# Patient Record
Sex: Male | Born: 1950 | Race: Black or African American | Hispanic: No | Smoking: Never smoker
Health system: Southern US, Community
[De-identification: ages and names within clinical notes are randomized; demographics above are authoritative.]

## PROBLEM LIST (undated history)

## (undated) DIAGNOSIS — N289 Disorder of kidney and ureter, unspecified: Secondary | ICD-10-CM

## (undated) DIAGNOSIS — N186 End stage renal disease: Secondary | ICD-10-CM

## (undated) DIAGNOSIS — Z992 Dependence on renal dialysis: Secondary | ICD-10-CM

## (undated) DIAGNOSIS — Z8679 Personal history of other diseases of the circulatory system: Secondary | ICD-10-CM

## (undated) DIAGNOSIS — I10 Essential (primary) hypertension: Secondary | ICD-10-CM

## (undated) DIAGNOSIS — Z872 Personal history of diseases of the skin and subcutaneous tissue: Secondary | ICD-10-CM

## (undated) DIAGNOSIS — C801 Malignant (primary) neoplasm, unspecified: Secondary | ICD-10-CM

## (undated) DIAGNOSIS — E119 Type 2 diabetes mellitus without complications: Secondary | ICD-10-CM

## (undated) HISTORY — PX: OTHER SURGICAL HISTORY: SHX169

## (undated) HISTORY — PX: NEPHRECTOMY: SHX65

## (undated) HISTORY — PX: KNEE SURGERY: SHX244

---

## 2011-06-10 DEATH — deceased

## 2011-07-22 ENCOUNTER — Emergency Department: Payer: Self-pay | Admitting: Emergency Medicine

## 2011-07-22 LAB — CBC
HCT: 34.9 % — ABNORMAL LOW (ref 40.0–52.0)
HGB: 11.8 g/dL — ABNORMAL LOW (ref 13.0–18.0)
MCH: 31.3 pg (ref 26.0–34.0)
MCV: 92 fL (ref 80–100)
Platelet: 134 10*3/uL — ABNORMAL LOW (ref 150–440)
RBC: 3.78 10*6/uL — ABNORMAL LOW (ref 4.40–5.90)
WBC: 4.3 10*3/uL (ref 3.8–10.6)

## 2011-07-22 LAB — URINALYSIS, COMPLETE
Bacteria: NONE SEEN
Ketone: NEGATIVE
Nitrite: NEGATIVE
Ph: 5 (ref 4.5–8.0)
Protein: 100
Specific Gravity: 1.012 (ref 1.003–1.030)

## 2011-07-22 LAB — BASIC METABOLIC PANEL
BUN: 43 mg/dL — ABNORMAL HIGH (ref 7–18)
Chloride: 98 mmol/L (ref 98–107)
Creatinine: 7.68 mg/dL — ABNORMAL HIGH (ref 0.60–1.30)
EGFR (Non-African Amer.): 8 — ABNORMAL LOW
Osmolality: 303 (ref 275–301)
Potassium: 4.2 mmol/L (ref 3.5–5.1)
Sodium: 138 mmol/L (ref 136–145)

## 2011-08-13 ENCOUNTER — Emergency Department: Payer: Self-pay | Admitting: *Deleted

## 2011-08-13 LAB — COMPREHENSIVE METABOLIC PANEL
Albumin: 3.7 g/dL (ref 3.4–5.0)
Alkaline Phosphatase: 108 U/L (ref 50–136)
BUN: 49 mg/dL — ABNORMAL HIGH (ref 7–18)
Bilirubin,Total: 0.3 mg/dL (ref 0.2–1.0)
Calcium, Total: 9 mg/dL (ref 8.5–10.1)
Creatinine: 8.72 mg/dL — ABNORMAL HIGH (ref 0.60–1.30)
EGFR (Non-African Amer.): 7 — ABNORMAL LOW
Osmolality: 300 (ref 275–301)
SGPT (ALT): 11 U/L — ABNORMAL LOW
Sodium: 138 mmol/L (ref 136–145)

## 2011-08-13 LAB — CBC
MCH: 30.7 pg (ref 26.0–34.0)
Platelet: 156 10*3/uL (ref 150–440)
RBC: 3.96 10*6/uL — ABNORMAL LOW (ref 4.40–5.90)
WBC: 7.9 10*3/uL (ref 3.8–10.6)

## 2011-08-13 LAB — TROPONIN I: Troponin-I: <0.02 ng/mL

## 2012-09-18 DIAGNOSIS — R310 Gross hematuria: Secondary | ICD-10-CM | POA: Insufficient documentation

## 2012-09-18 DIAGNOSIS — N401 Enlarged prostate with lower urinary tract symptoms: Secondary | ICD-10-CM | POA: Insufficient documentation

## 2012-09-19 DIAGNOSIS — N3000 Acute cystitis without hematuria: Secondary | ICD-10-CM | POA: Insufficient documentation

## 2012-09-19 DIAGNOSIS — N529 Male erectile dysfunction, unspecified: Secondary | ICD-10-CM | POA: Insufficient documentation

## 2015-06-15 ENCOUNTER — Other Ambulatory Visit: Payer: Self-pay | Admitting: Internal Medicine

## 2015-06-15 ENCOUNTER — Ambulatory Visit
Admission: RE | Admit: 2015-06-15 | Discharge: 2015-06-15 | Disposition: A | Discharge status: Home / Self Care | Payer: Medicare Other | Source: Ambulatory Visit | Attending: Internal Medicine | Admitting: Internal Medicine

## 2015-06-15 DIAGNOSIS — R059 Cough, unspecified: Secondary | ICD-10-CM

## 2015-06-15 DIAGNOSIS — R05 Cough: Secondary | ICD-10-CM

## 2015-09-25 ENCOUNTER — Emergency Department: Payer: Medicare Other

## 2015-09-25 ENCOUNTER — Encounter: Payer: Self-pay | Admitting: Emergency Medicine

## 2015-09-25 ENCOUNTER — Observation Stay
Admission: EM | Admit: 2015-09-25 | Discharge: 2015-09-26 | Disposition: A | Discharge status: Home / Self Care | Payer: Medicare Other | Attending: Internal Medicine | Admitting: Internal Medicine

## 2015-09-25 DIAGNOSIS — E1122 Type 2 diabetes mellitus with diabetic chronic kidney disease: Secondary | ICD-10-CM | POA: Insufficient documentation

## 2015-09-25 DIAGNOSIS — M549 Dorsalgia, unspecified: Secondary | ICD-10-CM | POA: Insufficient documentation

## 2015-09-25 DIAGNOSIS — M546 Pain in thoracic spine: Secondary | ICD-10-CM

## 2015-09-25 DIAGNOSIS — N186 End stage renal disease: Secondary | ICD-10-CM | POA: Diagnosis present

## 2015-09-25 DIAGNOSIS — I712 Thoracic aortic aneurysm, without rupture: Secondary | ICD-10-CM | POA: Diagnosis not present

## 2015-09-25 DIAGNOSIS — R079 Chest pain, unspecified: Principal | ICD-10-CM | POA: Insufficient documentation

## 2015-09-25 DIAGNOSIS — R0602 Shortness of breath: Secondary | ICD-10-CM | POA: Insufficient documentation

## 2015-09-25 DIAGNOSIS — R778 Other specified abnormalities of plasma proteins: Secondary | ICD-10-CM | POA: Insufficient documentation

## 2015-09-25 DIAGNOSIS — Z992 Dependence on renal dialysis: Secondary | ICD-10-CM | POA: Insufficient documentation

## 2015-09-25 DIAGNOSIS — I120 Hypertensive chronic kidney disease with stage 5 chronic kidney disease or end stage renal disease: Secondary | ICD-10-CM | POA: Insufficient documentation

## 2015-09-25 DIAGNOSIS — Z79899 Other long term (current) drug therapy: Secondary | ICD-10-CM | POA: Insufficient documentation

## 2015-09-25 DIAGNOSIS — I2510 Atherosclerotic heart disease of native coronary artery without angina pectoris: Secondary | ICD-10-CM | POA: Diagnosis not present

## 2015-09-25 DIAGNOSIS — Z85528 Personal history of other malignant neoplasm of kidney: Secondary | ICD-10-CM | POA: Diagnosis not present

## 2015-09-25 DIAGNOSIS — Z8249 Family history of ischemic heart disease and other diseases of the circulatory system: Secondary | ICD-10-CM | POA: Diagnosis not present

## 2015-09-25 DIAGNOSIS — D61818 Other pancytopenia: Secondary | ICD-10-CM | POA: Insufficient documentation

## 2015-09-25 DIAGNOSIS — F17200 Nicotine dependence, unspecified, uncomplicated: Secondary | ICD-10-CM | POA: Diagnosis not present

## 2015-09-25 DIAGNOSIS — Z794 Long term (current) use of insulin: Secondary | ICD-10-CM | POA: Diagnosis not present

## 2015-09-25 DIAGNOSIS — R7989 Other specified abnormal findings of blood chemistry: Secondary | ICD-10-CM | POA: Diagnosis present

## 2015-09-25 HISTORY — DX: Type 2 diabetes mellitus without complications: E11.9

## 2015-09-25 HISTORY — DX: Essential (primary) hypertension: I10

## 2015-09-25 HISTORY — DX: Personal history of diseases of the skin and subcutaneous tissue: Z87.2

## 2015-09-25 HISTORY — DX: Personal history of other diseases of the circulatory system: Z86.79

## 2015-09-25 HISTORY — DX: End stage renal disease: N18.6

## 2015-09-25 HISTORY — DX: Malignant (primary) neoplasm, unspecified: C80.1

## 2015-09-25 LAB — TROPONIN I
Troponin I: 0.06 ng/mL — ABNORMAL HIGH
Troponin I: 0.04 ng/mL — ABNORMAL HIGH (ref <0.031)
Troponin I: 0.04 ng/mL — ABNORMAL HIGH (ref <0.031)

## 2015-09-25 LAB — LIPID PANEL
CHOL/HDL RATIO: 4.5 ratio
Cholesterol: 143 mg/dL (ref 0–200)
HDL: 32 mg/dL — AB (ref >40)
LDL Cholesterol: 92 mg/dL (ref 0–99)
Triglycerides: 93 mg/dL (ref <150)
VLDL: 19 mg/dL (ref 0–40)

## 2015-09-25 LAB — CBC
HCT: 30.9 % — ABNORMAL LOW (ref 40.0–52.0)
Hemoglobin: 10.6 g/dL — ABNORMAL LOW (ref 13.0–18.0)
MCH: 31.1 pg (ref 26.0–34.0)
MCHC: 34.3 g/dL (ref 32.0–36.0)
MCV: 90.8 fL (ref 80.0–100.0)
PLATELETS: 125 10*3/uL — AB (ref 150–440)
RBC: 3.4 MIL/uL — AB (ref 4.40–5.90)
RDW: 14.4 % (ref 11.5–14.5)
WBC: 3.7 10*3/uL — ABNORMAL LOW (ref 3.8–10.6)

## 2015-09-25 LAB — BASIC METABOLIC PANEL WITH GFR
Anion gap: 8 (ref 5–15)
BUN: 31 mg/dL — ABNORMAL HIGH (ref 6–20)
CO2: 32 mmol/L (ref 22–32)
Calcium: 8.8 mg/dL — ABNORMAL LOW (ref 8.9–10.3)
Chloride: 95 mmol/L — ABNORMAL LOW (ref 101–111)
Creatinine, Ser: 8.86 mg/dL — ABNORMAL HIGH (ref 0.61–1.24)
GFR calc Af Amer: 6 mL/min — ABNORMAL LOW
GFR calc non Af Amer: 6 mL/min — ABNORMAL LOW
Glucose, Bld: 349 mg/dL — ABNORMAL HIGH (ref 65–99)
Potassium: 4.1 mmol/L (ref 3.5–5.1)
Sodium: 135 mmol/L (ref 135–145)

## 2015-09-25 LAB — MRSA PCR SCREENING: MRSA BY PCR: POSITIVE — AB

## 2015-09-25 LAB — GLUCOSE, CAPILLARY: GLUCOSE-CAPILLARY: 222 mg/dL — AB (ref 65–99)

## 2015-09-25 MED ORDER — LIDOCAINE-PRILOCAINE 2.5-2.5 % EX CREA: 1.0000 "application " | TOPICAL_CREAM | CUTANEOUS | Status: DC

## 2015-09-25 MED ORDER — SEVELAMER CARBONATE 800 MG PO TABS
2400.0000 mg | ORAL_TABLET | Freq: Two times a day (BID) | ORAL | Status: DC
  Administered 2015-09-25 – 2015-09-26 (×2): 2400 mg via ORAL
  Filled 2015-09-25 (×2): qty 3

## 2015-09-25 MED ORDER — INSULIN ASPART 100 UNIT/ML ~~LOC~~ SOLN
0.0000 [IU] | Freq: Three times a day (TID) | SUBCUTANEOUS | Status: DC
  Administered 2015-09-25: 4 [IU] via SUBCUTANEOUS
  Administered 2015-09-26: 11 [IU] via SUBCUTANEOUS
  Filled 2015-09-25: qty 11
  Filled 2015-09-25: qty 4

## 2015-09-25 MED ORDER — ASPIRIN EC 81 MG PO TBEC
81.0000 mg | DELAYED_RELEASE_TABLET | Freq: Every day | ORAL | Status: DC
  Administered 2015-09-25 – 2015-09-26 (×2): 81 mg via ORAL
  Filled 2015-09-25 (×2): qty 1

## 2015-09-25 MED ORDER — HYDROCODONE-ACETAMINOPHEN 5-325 MG PO TABS
1.0000 | ORAL_TABLET | ORAL | Status: DC | PRN
  Administered 2015-09-25 – 2015-09-26 (×3): 1 via ORAL
  Filled 2015-09-25 (×3): qty 1

## 2015-09-25 MED ORDER — ALUM & MAG HYDROXIDE-SIMETH 200-200-20 MG/5ML PO SUSP: 30.0000 mL | Freq: Four times a day (QID) | ORAL | Status: DC | PRN

## 2015-09-25 MED ORDER — INSULIN ASPART 100 UNIT/ML ~~LOC~~ SOLN
0.0000 [IU] | Freq: Every day | SUBCUTANEOUS | Status: DC
  Administered 2015-09-25: 100 [IU] via SUBCUTANEOUS
  Filled 2015-09-25: qty 2

## 2015-09-25 MED ORDER — HYDRALAZINE HCL 20 MG/ML IJ SOLN
10.0000 mg | Freq: Four times a day (QID) | INTRAMUSCULAR | Status: DC | PRN
  Administered 2015-09-25: 10 mg via INTRAVENOUS
  Filled 2015-09-25: qty 1

## 2015-09-25 MED ORDER — INSULIN DETEMIR 100 UNIT/ML ~~LOC~~ SOLN
25.0000 [IU] | Freq: Every day | SUBCUTANEOUS | Status: DC
  Administered 2015-09-25: 25 [IU] via SUBCUTANEOUS
  Filled 2015-09-25 (×2): qty 0.25

## 2015-09-25 MED ORDER — ACETAMINOPHEN 650 MG RE SUPP: 650.0000 mg | Freq: Four times a day (QID) | RECTAL | Status: DC | PRN

## 2015-09-25 MED ORDER — HEPARIN SODIUM (PORCINE) 5000 UNIT/ML IJ SOLN
5000.0000 [IU] | Freq: Three times a day (TID) | INTRAMUSCULAR | Status: DC
  Administered 2015-09-25 – 2015-09-26 (×2): 5000 [IU] via SUBCUTANEOUS
  Filled 2015-09-25 (×2): qty 1

## 2015-09-25 MED ORDER — AMLODIPINE BESYLATE 10 MG PO TABS
10.0000 mg | ORAL_TABLET | Freq: Every day | ORAL | Status: DC
  Administered 2015-09-25 – 2015-09-26 (×2): 10 mg via ORAL
  Filled 2015-09-25 (×2): qty 2
  Filled 2015-09-25: qty 1

## 2015-09-25 MED ORDER — ONDANSETRON HCL 4 MG/2ML IJ SOLN: 4.0000 mg | Freq: Four times a day (QID) | INTRAMUSCULAR | Status: DC | PRN

## 2015-09-25 MED ORDER — IOHEXOL 350 MG/ML SOLN
75.0000 mL | Freq: Once | INTRAVENOUS | Status: AC | PRN
  Administered 2015-09-25: 75 mL via INTRAVENOUS

## 2015-09-25 MED ORDER — FUROSEMIDE 40 MG PO TABS: 80.0000 mg | ORAL_TABLET | ORAL | Status: DC

## 2015-09-25 MED ORDER — SODIUM CHLORIDE 0.9% FLUSH
3.0000 mL | Freq: Two times a day (BID) | INTRAVENOUS | Status: DC
  Administered 2015-09-25 – 2015-09-26 (×2): 3 mL via INTRAVENOUS

## 2015-09-25 MED ORDER — METOPROLOL TARTRATE 25 MG PO TABS
25.0000 mg | ORAL_TABLET | Freq: Two times a day (BID) | ORAL | Status: DC
  Administered 2015-09-25 – 2015-09-26 (×3): 25 mg via ORAL
  Filled 2015-09-25 (×3): qty 1

## 2015-09-25 MED ORDER — SENNOSIDES-DOCUSATE SODIUM 8.6-50 MG PO TABS: 1.0000 | ORAL_TABLET | Freq: Every evening | ORAL | Status: DC | PRN

## 2015-09-25 MED ORDER — ACETAMINOPHEN 325 MG PO TABS: 650.0000 mg | ORAL_TABLET | Freq: Four times a day (QID) | ORAL | Status: DC | PRN

## 2015-09-25 MED ORDER — ONDANSETRON HCL 4 MG PO TABS: 4.0000 mg | ORAL_TABLET | Freq: Four times a day (QID) | ORAL | Status: DC | PRN

## 2015-09-25 NOTE — H&P (Signed)
[place] [place] NAME: Timothy Gibson    MR#:  [id number]  DATE OF BIRTH:  February 28, 1951  DATE OF ADMISSION:  09/25/2015  PRIMARY CARE PHYSICIAN: Marden Noble, MD   REQUESTING/REFERRING PHYSICIAN: Dr. Jimmye Norman  CHIEF COMPLAINT:   Right-sided chest pain radiating to back  HISTORY OF PRESENT ILLNESS:  Timothy Gibson  is a 65 y.o. male with a known history of ESRD on hemodialysis Monday, Wednesday and Friday, diabetes and essential hypertension who presents with above complaint. Patient reports over the past 3 weeks he's had on and off right-sided chest pain radiating to the back. He has no other associated symptoms such as nausea or shortness of breath.. He denies trauma. In the emergency room CT scan was ordered to evaluate for dissection was negative.   PAST MEDICAL HISTORY:   Past Medical History  Diagnosis Date  . ESRD (end stage renal disease) ([place])   . Hypertension   . Diabetes mellitus without complication ([place])   . Cancer ([place])     right kidney  . History of gangrene     PAST SURGICAL HISTORY:   Past Surgical History  Procedure Laterality Date  . Nephrectomy    . Knee surgery      SOCIAL HISTORY:   Social History  Substance Use Topics  . Smoking status: Current Some Day SmokerHe smokes cigars occasionally   . Smokeless tobacco: no  . Alcohol Use: No    FAMILY HISTORY:  Positive for CAD, essential hypertension and diabetes  DRUG ALLERGIES:  No Known Allergies   REVIEW OF SYSTEMS:  CONSTITUTIONAL: No fever, fatigue or weakness.  EYES: No blurred or double vision.  EARS, NOSE, AND THROAT: No tinnitus or ear pain.  RESPIRATORY: No cough, shortness of breath, wheezing or hemoptysis.  CARDIOVASCULAR: On and off chest pain,  no orthopnea, edema.  GASTROINTESTINAL: No nausea, vomiting, diarrhea or abdominal pain.  GENITOURINARY: No dysuria, hematuria.  ENDOCRINE: No polyuria, nocturia,  HEMATOLOGY: No  anemia, easy bruising or bleeding SKIN: No rash or lesion. MUSCULOSKELETAL: No joint pain or arthritis.   NEUROLOGIC: No tingling, numbness, weakness.  PSYCHIATRY: No anxiety or depression.   MEDICATIONS AT HOME:   Prior to Admission medications   Medication Sig Start Date End Date Taking? Authorizing Provider  furosemide (LASIX) 80 MG tablet Take 80 mg by mouth 3 (three) times a week. Take on Tuesday, Thursday, and Saturday. 07/30/15  Yes Historical Provider, MD  LEVEMIR FLEXTOUCH 100 UNIT/ML Pen Inject 25 Units into the skin at bedtime. 07/10/15  Yes Historical Provider, MD  lidocaine-prilocaine (EMLA) cream Apply 1 application topically every Monday, Wednesday, and Friday. Apply 2 hours before dialysis. 05/23/10  Yes Historical Provider, MD  sevelamer carbonate (RENVELA) 800 MG tablet Take 2,400 mg by mouth 2 (two) times daily with a meal.   Yes Historical Provider, MD      VITAL SIGNS:  Blood pressure 173/86, pulse 77, temperature 98.4 F (36.9 C), temperature source Oral, resp. rate 18, height 6\' 4"  (1.93 m), weight 124.739 kg (275 lb), SpO2 99 %.  PHYSICAL EXAMINATION:  GENERAL:  65 y.o.-year-old patient lying in the bed with no acute distress.  EYES: Pupils equal, round, reactive to light and accommodation. No scleral icterus. Extraocular muscles intact.  HEENT: Head atraumatic, normocephalic. Oropharynx and nasopharynx clear.  NECK:  Supple, no jugular venous distention. No thyroid enlargement, no tenderness.  LUNGS: Normal breath sounds bilaterally, no wheezing, rales,rhonchi or crepitation. No use  of accessory muscles of respiration.  CARDIOVASCULAR: S1, S2 normal. No murmurs, rubs, or gallops.  ABDOMEN: Soft, nontender, nondistended. Bowel sounds present. No organomegaly or mass.  EXTREMITIES: No pedal edema, cyanosis, or clubbing. Left forearm AV fistula NEUROLOGIC: Cranial nerves II through XII are grossly intact. No focal deficits. PSYCHIATRIC: The patient is alert and  oriented x 3.  SKIN: No obvious rash, lesion, or ulcer.   LABORATORY PANEL:   CBC  Recent Labs Lab 09/25/15 0931  WBC 3.7*  HGB 10.6*  HCT 30.9*  PLT 125*   ------------------------------------------------------------------------------------------------------------------  Chemistries   Recent Labs Lab 09/25/15 0931  NA 135  K 4.1  CL 95*  CO2 32  GLUCOSE 349*  BUN 31*  CREATININE 8.86*  CALCIUM 8.8*   ------------------------------------------------------------------------------------------------------------------  Cardiac Enzymes  Recent Labs Lab 09/25/15 0931  TROPONINI 0.06*   ------------------------------------------------------------------------------------------------------------------  RADIOLOGY:  Dg Chest 2 View  09/25/2015  CLINICAL DATA:  Shortness of breath with chest pain. History of renal cell carcinoma EXAM: CHEST  2 VIEW COMPARISON:  June 15, 2015. FINDINGS: There is no edema or consolidation. Heart size and pulmonary vascularity are normal. No adenopathy. No bone lesions. No pneumothorax. IMPRESSION: No edema or consolidation. Electronically Signed   By: Lowella Grip III M.D.   On: 09/25/2015 10:29   Ct Angio Chest Aorta W/cm &/or Wo/cm  09/25/2015  CLINICAL DATA:  upper back pain for about 3 weeks, seen by MD for same issue 3 weeks ago and was given medication for gas, patient has been taking this and it has not been helping. Patient states that the pain "bounces around" depending on what side he is laying on. EXAM: CT ANGIOGRAPHY CHEST WITH CONTRAST TECHNIQUE: Multidetector CT imaging of the chest was performed using the standard protocol during bolus administration of intravenous contrast. Multiplanar CT image reconstructions and MIPs were obtained to evaluate the vascular anatomy. CONTRAST:  100mL OMNIPAQUE IOHEXOL 350 MG/ML SOLN COMPARISON:  None. FINDINGS: Vascular: Right arm IV contrast injection. SVC patent. Right atrium nondilated. Right  ventricle nondilated. Fair contrast of pulmonary artery branches, with no evidence of large central pulmonary embolus. Exam not optimized for detection of small pulmonary emboli. Patent bilateral pulmonary veins. There is an accessory superior segment right lower lobe pulmonary vein, anatomic variant. Mitral and aortic valve calcifications. Scattered coronary calcifications. Borderline dilatation of the ascending aorta, 4.2 maximum transverse diameter. Proximal arch 3.7 cm diameter. No dissection or stenosis. No intramural hematoma on noncontrast study. Classic 3 vessel brachiocephalic arterial origin anatomy without proximal stenosis. Descending segment normal in caliber, mildly tortuous. Patchy atheromatous calcifications in the arch and descending thoracic aorta. Visualized suprarenal abdominal aorta unremarkable. Mediastinum/Lymph Nodes: No masses or pathologically enlarged lymph nodes identified. No pericardial effusion. Lungs/Pleura: No pulmonary mass, infiltrate, or effusion. Minimal linear scarring or subsegmental atelectasis posteriorly in both lower lobes. Upper abdomen: No acute findings. Musculoskeletal: No chest wall mass or suspicious bone lesions identified. Review of the MIP images confirms the above findings. IMPRESSION: 1. 4.2 cm ascending aortic aneurysm without dissection or other complicating features. Recommend annual imaging followup by CTA or MRA. This recommendation follows 2010 ACCF/AHA/AATS/ACR/ASA/SCA/SCAI/SIR/STS/SVM Guidelines for the Diagnosis and Management of Patients with Thoracic Aortic Disease. Circulation. 2010; 121: [id number] 2. Atherosclerosis, including aortic and coronary artery disease. Please note that although the presence of coronary artery calcium documents the presence of coronary artery disease, the severity of this disease and any potential stenosis cannot be assessed on this non-gated CT examination. Assessment for potential risk factor  modification, dietary therapy  or pharmacologic therapy may be warranted, if clinically indicated. Electronically Signed   By: Lucrezia Europe M.D.   On: 09/25/2015 12:37    EKG:   Normal sinus rhythm no ST elevation or depression  IMPRESSION AND PLAN:   65 year old male with end-stage renal disease on hemodialysis who presents with atypical chest pain.  1. Atypical chest pain with mildly elevated troponin: Pain sounds musculoskeletal in nature. Patient will be admitted to telemetry. Continue to trend troponins to rule out acute coronary syndrome Patient would benefit from outpatient cardiology follow-up if troponins are essentially unremarkable.  2. ESRD and hematemesis: Patient will need nephrology consultation for dialysis.  3.4.2 cm ascending aortic aneurysm without dissection or other complicating features. Recommend annual imaging followup by CTA or MRA.  4. Diabetes: Continue Levemir, ADA diet and sliding scale insulin.  5. Accelerated hypertension: Patient is not currently taking any medications for blood pressure.  Start Norvasc and metoprolol and follow blood pressure.    All the records are reviewed and case discussed with ED provider. Management plans discussed with the patient and she is in agreement.  CODE STATUS: FULL  TOTAL TIME TAKING CARE OF THIS PATIENT: 50 minutes.    Dom Haverland M.D on 09/25/2015 at 1:34 PM  Between 7am to 6pm - Pager - [phone] After 6pm go to [email or web address] - password [email or web address] [facility]  Office  [phone]  CC: Primary care physician; Marden Noble, MD

## 2015-09-25 NOTE — ED Notes (Signed)
Patient states that he has been having upper back pain for about 3 weeks, seen by MD for same issue 3 weeks ago and was given medication for gas, patient has been taking this and it has not been helping. Patient states that the pain "bounces around" depending on what side he is laying on.

## 2015-09-25 NOTE — Progress Notes (Signed)
Pt. admitted to unit, [id number] from ED, report from [place], [place]. Oriented to room, call bell, Ascom phones and staff. Bed in low position. Fall safety plan reviewed, blue non-skid socks in place. Full assessment to Epic; skin assessed with Georga Hacking, RN. Telemetry box verified with tele clerk and Reather Converse, [place]: [phone] . Will continue to monitor.

## 2015-09-25 NOTE — ED Notes (Signed)
Patient transported to CT 

## 2015-09-25 NOTE — ED Provider Notes (Signed)
[facility] Emergency Department Provider Note     Time seen: ----------------------------------------- 11:26 AM on 09/25/2015 -----------------------------------------    I have reviewed the triage vital signs and the nursing notes.   HISTORY  Chief Complaint Back Pain and Chest Pain    HPI Timothy Gibson is a 65 y.o. male who presents to ER for upper back pain for 3 weeks. He was seen by his primary care doctor and was given medication for gas. States is not been helping, the pains or bounces around his chest is worse whenever he lays a certain way. He said some pain over his chest but he says it doesn't feel like it a heart attack. Patient denies shortness of breath.   Past Medical History  Diagnosis Date  . ESRD (end stage renal disease) ([place])   . Hypertension   . Diabetes mellitus without complication ([place])   . Cancer ([place])     right kidney  . History of gangrene     There are no active problems to display for this patient.   Past Surgical History  Procedure Laterality Date  . Nephrectomy    . Knee surgery      Allergies Review of patient's allergies indicates no known allergies.  Social History Social History  Substance Use Topics  . Smoking status: Current Some Day Smoker  . Smokeless tobacco: None  . Alcohol Use: No    Review of Systems Constitutional: Negative for fever. Eyes: Negative for visual changes. ENT: Negative for sore throat. Cardiovascular: Positive chest pain Respiratory: Negative for shortness of breath. Gastrointestinal: Negative for abdominal pain, vomiting and diarrhea. Genitourinary: Negative for dysuria. Musculoskeletal: Positive for back pain Skin: Negative for rash. Neurological: Negative for headaches, focal weakness or numbness.  10-point ROS otherwise negative.  ____________________________________________   PHYSICAL EXAM:  VITAL SIGNS: ED Triage Vitals  Enc Vitals Group     BP  09/25/15 0923 185/88 mmHg     Pulse Rate 09/25/15 0923 84     Resp 09/25/15 0923 18     Temp 09/25/15 0923 98.4 F (36.9 C)     Temp Source 09/25/15 0923 Oral     SpO2 09/25/15 0923 97 %     Weight 09/25/15 0923 275 lb (124.739 kg)     Height 09/25/15 0923 6\' 4"  (1.93 m)     Head Cir --      Peak Flow --      Pain Score 09/25/15 0927 4     Pain Loc --      Pain Edu? --      Excl. in [place]? --     Constitutional: Alert and oriented. Well appearing and in no distress. Eyes: Conjunctivae are normal. PERRL. Normal extraocular movements. ENT   Head: Normocephalic and atraumatic.   Nose: No congestion/rhinnorhea.   Mouth/Throat: Mucous membranes are moist.   Neck: No stridor. Cardiovascular: Normal rate, regular rhythm. Normal and symmetric distal pulses are present in all extremities. No murmurs, rubs, or gallops. Respiratory: Normal respiratory effort without tachypnea nor retractions. Breath sounds are clear and equal bilaterally. No wheezes/rales/rhonchi. Gastrointestinal: Soft and nontender. No distention. No abdominal bruits.  Musculoskeletal: Nontender with normal range of motion in all extremities. No joint effusions.  No lower extremity tenderness nor edema. Neurologic:  Normal speech and language. No gross focal neurologic deficits are appreciated. Speech is normal. No gait instability. Skin:  Skin is warm, dry and intact. No rash noted. Psychiatric: Mood and affect are normal. Speech and  behavior are normal. Patient exhibits appropriate insight and judgment. ____________________________________________  EKG: Interpreted by me. Normal sinus rhythm with rate 87 bpm, normal PR interval, normal QRS, normal QT interval. Left ward axis.  ____________________________________________  ED COURSE:  Pertinent labs & imaging results that were available during my care of the patient were reviewed by me and considered in my medical decision making (see chart for  details). Patient is in no acute distress, appears comfortable. I will assess for aortic dissection. I will also discussed with nephrology. ____________________________________________    LABS (pertinent positives/negatives)  Labs Reviewed  BASIC METABOLIC PANEL - Abnormal; Notable for the following:    Chloride 95 (*)    Glucose, Bld 349 (*)    BUN 31 (*)    Creatinine, Ser 8.86 (*)    Calcium 8.8 (*)    GFR calc non Af Amer 6 (*)    GFR calc Af Amer 6 (*)    All other components within normal limits  CBC - Abnormal; Notable for the following:    WBC 3.7 (*)    RBC 3.40 (*)    Hemoglobin 10.6 (*)    HCT 30.9 (*)    Platelets 125 (*)    All other components within normal limits  TROPONIN I - Abnormal; Notable for the following:    Troponin I 0.06 (*)    All other components within normal limits    RADIOLOGY Images were viewed by me  CT angiogram of chest  IMPRESSION: No edema or consolidation. IMPRESSION: 1. 4.2 cm ascending aortic aneurysm without dissection or other complicating features. Recommend annual imaging followup by CTA or MRA. This recommendation follows 2010 ACCF/AHA/AATS/ACR/ASA/SCA/SCAI/SIR/STS/SVM Guidelines for the Diagnosis and Management of Patients with Thoracic Aortic Disease. Circulation. 2010; 121: [id number] 2. Atherosclerosis, including aortic and coronary artery disease. Please note that although the presence of coronary artery calcium documents the presence of coronary artery disease, the severity of this disease and any potential stenosis cannot be assessed on this non-gated CT examination. Assessment for potential risk factor modification, dietary therapy or pharmacologic therapy may be warranted, if clinically indicated. ____________________________________________  FINAL ASSESSMENT AND PLAN  Back pain, chest pain, elevated troponin, aortic aneurysm  Plan: Patient with labs and imaging as dictated above. Elevated troponin is likely  end-stage renal disease related, however his previous tests have been normal. I discussed with nephrology who agrees to admission. Patient agrees with plan.   Earleen Newport, MD   Earleen Newport, MD 09/25/15 [phone]

## 2015-09-25 NOTE — ED Notes (Signed)
Dr. Reita Cliche informed of troponin of 0.06

## 2015-09-25 NOTE — Care Management Obs Status (Signed)
[facility] NOTIFICATION   Patient Details  Name: Timothy Gibson MRN: [id number] Date of Birth: 08-30-1950   Medicare Observation Status Notification Given:  Yes (chest pain)    Ival Bible, RN 09/25/2015, 2:15 PM

## 2015-09-25 NOTE — Progress Notes (Signed)
Patient takes Auryxia 210mg  TID WC, pharmacy called as this medication did not show up to add for PTA med list. Pharmacy called to assess and address. Awaiting for call back.

## 2015-09-25 NOTE — ED Notes (Addendum)
For 3 weeks has been having pain across whole mid back but worse under right shoulder blade.  Has also had some pain in the chest but reports this does not feel like a heart attack.  Pt is dialysis pt.  Has not missed any dialysis. Pt also reports some shortness of breath even at rest.  Currently denies [facility].

## 2015-09-26 LAB — TROPONIN I: Troponin I: 0.04 ng/mL — ABNORMAL HIGH (ref <0.031)

## 2015-09-26 LAB — GLUCOSE, CAPILLARY: Glucose-Capillary: 299 mg/dL — ABNORMAL HIGH (ref 65–99)

## 2015-09-26 MED ORDER — HYDROCODONE-ACETAMINOPHEN 5-325 MG PO TABS: 1.0000 | ORAL_TABLET | ORAL | Status: DC | PRN

## 2015-09-26 MED ORDER — METOPROLOL TARTRATE 25 MG PO TABS: 25.0000 mg | ORAL_TABLET | Freq: Two times a day (BID) | ORAL | Status: DC

## 2015-09-26 MED ORDER — GABAPENTIN 100 MG PO CAPS: 100.0000 mg | ORAL_CAPSULE | Freq: Every day | ORAL | Status: DC

## 2015-09-26 MED ORDER — GABAPENTIN 100 MG PO CAPS: ORAL_CAPSULE | ORAL | Status: DC

## 2015-09-26 MED ORDER — METHYLPREDNISOLONE SODIUM SUCC 40 MG IJ SOLR
20.0000 mg | Freq: Once | INTRAMUSCULAR | Status: AC
  Administered 2015-09-26: 20 mg via INTRAVENOUS
  Filled 2015-09-26: qty 1

## 2015-09-26 NOTE — Progress Notes (Signed)
Per lab, protein electrophoresis has been added to AM draw. Per MD note, pt can discharge w/o waiting for result.

## 2015-09-26 NOTE — Discharge Summary (Signed)
[place] [facility] [place] NAME: Timothy Gibson    MR#:  [id number]  DATE OF BIRTH:  [birth year]  DATE OF ADMISSION:  09/25/2015 ADMITTING PHYSICIAN: Bettey Costa, MD  DATE OF DISCHARGE: 09/26/2015 10:12 AM  PRIMARY CARE PHYSICIAN: Marden Noble, MD    ADMISSION DIAGNOSIS:  End stage renal disease ([place]) [N18.6] Elevated troponin I level [R79.89] Right-sided thoracic back pain [M54.6] Chest pain, unspecified chest pain type [R07.9]  DISCHARGE DIAGNOSIS:  Active Problems:   Chest pain   SECONDARY DIAGNOSIS:   Past Medical History  Diagnosis Date  . ESRD (end stage renal disease) ([place])   . Hypertension   . Diabetes mellitus without complication ([place])   . Cancer ([place])     right kidney  . History of gangrene     HOSPITAL COURSE:   1. Sharp right-sided chest pain going on for 3 weeks. CT angiogram of the chest was negative for acute dissection. The patient did have an aneurysm in the ascending aorta which needs to be followed up as outpatient. The patient's troponins were only borderline of 0.04. I do not think this is heart in nature. But can follow up for an outpatient stress test. I think this is more musculoskeletal since the patient has moving pain throughout his chest and rib area. I ordered a serum protein electrophoresis which needs to be followed up as outpatient. I gave 1 dose of Solu-Medrol just in case inflammatory in nature. I also gave a trial of gabapentin just in case this is shingles without a rash or nerve type pain. 2. End-stage renal disease on dialysis continue dialysis as scheduled 3. Accelerated hypertension metoprolol was added to his usual medications 4. Type 2 diabetes mellitus without complication he may have to give a higher dose of the Lantus this evening secondary to steroids today. 5. Borderline elevated troponin this is a false positive secondary to being a dialysis patient. 6. Ascending aortic aneurysm.  Will need outpatient referral to a tertiary care [facility] for follow-up of this. 7. Pancytopenia. Looks chronic from back in 2013.  DISCHARGE CONDITIONS:   Satisfactory  CONSULTS OBTAINED:  Treatment Team:  Anthonette Legato, MD  DRUG ALLERGIES:  No Known Allergies  DISCHARGE MEDICATIONS:   Discharge Medication List as of 09/26/2015  8:48 AM    START taking these medications   Details  gabapentin (NEURONTIN) 100 MG capsule One tablet at night for three nights, if still having pain then go up to two tablets every night, Print    HYDROcodone-acetaminophen (NORCO/VICODIN) 5-325 MG tablet Take 1 tablet by mouth every 4 (four) hours as needed for moderate pain., Starting 09/26/2015, Until Discontinued, Print    metoprolol tartrate (LOPRESSOR) 25 MG tablet Take 1 tablet (25 mg total) by mouth 2 (two) times daily., Starting 09/26/2015, Until Discontinued, Print      CONTINUE these medications which have NOT CHANGED   Details  Ferric Citrate (AURYXIA) 1 GM 210 MG(Fe) TABS Take 3 tablets by mouth 2 (two) times daily with a meal., Until Discontinued, Historical Med    furosemide (LASIX) 80 MG tablet Take 80 mg by mouth 3 (three) times a week. Take on Tuesday, Thursday, and Saturday., Starting 07/30/2015, Until Discontinued, Historical Med    LEVEMIR FLEXTOUCH 100 UNIT/ML Pen Inject 25 Units into the skin at bedtime., Starting 07/10/2015, Until Discontinued, Historical Med    lidocaine-prilocaine (EMLA) cream Apply 1 application topically every Monday, Wednesday, and Friday. Apply 2 hours before  dialysis., Starting 05/23/2010, Until Discontinued, Historical Med    sevelamer carbonate (RENVELA) 800 MG tablet Take 2,400 mg by mouth 2 (two) times daily with a meal., Until Discontinued, Historical Med         DISCHARGE INSTRUCTIONS:   Follow up with dialysis as scheduled Follow-up with PMD tomorrow afternoon as scheduled. Can get vascular surgery referral and cardiology referral as  outpatient.  If you experience worsening of your admission symptoms, develop shortness of breath, life threatening emergency, suicidal or homicidal thoughts you must seek medical attention immediately by calling 911 or calling your MD immediately  if symptoms less severe.  You Must read complete instructions/literature along with all the possible adverse reactions/side effects for all the Medicines you take and that have been prescribed to you. Take any new Medicines after you have completely understood and accept all the possible adverse reactions/side effects.   Please note  You were cared for by a hospitalist during your hospital stay. If you have any questions about your discharge medications or the care you received while you were in the hospital after you are discharged, you can call the unit and asked to speak with the hospitalist on call if the hospitalist that took care of you is not available. Once you are discharged, your primary care physician will handle any further medical issues. Please note that NO REFILLS for any discharge medications will be authorized once you are discharged, as it is imperative that you return to your primary care physician (or establish a relationship with a primary care physician if you do not have one) for your aftercare needs so that they can reassess your need for medications and monitor your lab values.    Today   CHIEF COMPLAINT:   Chief Complaint  Patient presents with  . Back Pain  . Chest Pain    HISTORY OF PRESENT ILLNESS:  Timothy Gibson  is a 65 y.o. male presented with 3 weeks worth of chest pain. Found to have a borderline troponin.   VITAL SIGNS:  Blood pressure 150/87, pulse 78, temperature 98.1 F (36.7 C), temperature source Oral, resp. rate 16, height 6\' 4"  (1.93 m), weight 130.681 kg (288 lb 1.6 oz), SpO2 95 %.  I/O:   Intake/Output Summary (Last 24 hours) at 09/26/15 1532 Last data filed at 09/26/15 0819  Gross per 24 hour   Intake      0 ml  Output      0 ml  Net      0 ml    PHYSICAL EXAMINATION:  GENERAL:  65 y.o.-year-old patient lying in the bed with no acute distress.  EYES: Pupils equal, round, reactive to light and accommodation. No scleral icterus. Extraocular muscles intact.  HEENT: Head atraumatic, normocephalic. Oropharynx and nasopharynx clear.  NECK:  Supple, no jugular venous distention. No thyroid enlargement, no tenderness.  LUNGS: Normal breath sounds bilaterally, no wheezing, rales,rhonchi or crepitation. No use of accessory muscles of respiration.  CARDIOVASCULAR: S1, S2 normal. No murmurs, rubs, or gallops. Moving tenderness to palpation over the left posterior ribs. No tenderness to palpation over the right ribs where he had his pain yesterday. ABDOMEN: Soft, non-tender, non-distended. Bowel sounds present. No organomegaly or mass.  EXTREMITIES: Trace edema, no cyanosis, or clubbing.  NEUROLOGIC: Cranial nerves II through XII are intact. Muscle strength 5/5 in all extremities. Sensation intact. Gait not checked.  PSYCHIATRIC: The patient is alert and oriented x 3.  SKIN: No obvious rash, lesion, or ulcer.   DATA  REVIEW:   CBC  Recent Labs Lab 09/25/15 0931  WBC 3.7*  HGB 10.6*  HCT 30.9*  PLT 125*    Chemistries   Recent Labs Lab 09/25/15 0931  NA 135  K 4.1  CL 95*  CO2 32  GLUCOSE 349*  BUN 31*  CREATININE 8.86*  CALCIUM 8.8*    Cardiac Enzymes  Recent Labs Lab 09/26/15 0423  TROPONINI 0.04*    Microbiology Results  Results for orders placed or performed during the hospital encounter of 09/25/15  MRSA PCR Screening     Status: Abnormal   Collection Time: 09/25/15  5:09 PM  Result Value Ref Range Status   MRSA by PCR POSITIVE (A) NEGATIVE Final    Comment:        The GeneXpert MRSA Assay (FDA approved for NASAL specimens only), is one component of a comprehensive MRSA colonization surveillance program. It is not intended to diagnose  MRSA infection nor to guide or monitor treatment for MRSA infections. CRITICAL RESULT CALLED TO, READ BACK BY AND VERIFIED WITH: MADDIE HIMES AT 1850 09/25/15.PMH     RADIOLOGY:  Dg Chest 2 View  09/25/2015  CLINICAL DATA:  Shortness of breath with chest pain. History of renal cell carcinoma EXAM: CHEST  2 VIEW COMPARISON:  June 15, 2015. FINDINGS: There is no edema or consolidation. Heart size and pulmonary vascularity are normal. No adenopathy. No bone lesions. No pneumothorax. IMPRESSION: No edema or consolidation. Electronically Signed   By: Lowella Grip III M.D.   On: 09/25/2015 10:29   Ct Angio Chest Aorta W/cm &/or Wo/cm  09/25/2015  CLINICAL DATA:  upper back pain for about 3 weeks, seen by MD for same issue 3 weeks ago and was given medication for gas, patient has been taking this and it has not been helping. Patient states that the pain "bounces around" depending on what side he is laying on. EXAM: CT ANGIOGRAPHY CHEST WITH CONTRAST TECHNIQUE: Multidetector CT imaging of the chest was performed using the standard protocol during bolus administration of intravenous contrast. Multiplanar CT image reconstructions and MIPs were obtained to evaluate the vascular anatomy. CONTRAST:  20mL OMNIPAQUE IOHEXOL 350 MG/ML SOLN COMPARISON:  None. FINDINGS: Vascular: Right arm IV contrast injection. SVC patent. Right atrium nondilated. Right ventricle nondilated. Fair contrast of pulmonary artery branches, with no evidence of large central pulmonary embolus. Exam not optimized for detection of small pulmonary emboli. Patent bilateral pulmonary veins. There is an accessory superior segment right lower lobe pulmonary vein, anatomic variant. Mitral and aortic valve calcifications. Scattered coronary calcifications. Borderline dilatation of the ascending aorta, 4.2 maximum transverse diameter. Proximal arch 3.7 cm diameter. No dissection or stenosis. No intramural hematoma on noncontrast study. Classic 3  vessel brachiocephalic arterial origin anatomy without proximal stenosis. Descending segment normal in caliber, mildly tortuous. Patchy atheromatous calcifications in the arch and descending thoracic aorta. Visualized suprarenal abdominal aorta unremarkable. Mediastinum/Lymph Nodes: No masses or pathologically enlarged lymph nodes identified. No pericardial effusion. Lungs/Pleura: No pulmonary mass, infiltrate, or effusion. Minimal linear scarring or subsegmental atelectasis posteriorly in both lower lobes. Upper abdomen: No acute findings. Musculoskeletal: No chest wall mass or suspicious bone lesions identified. Review of the MIP images confirms the above findings. IMPRESSION: 1. 4.2 cm ascending aortic aneurysm without dissection or other complicating features. Recommend annual imaging followup by CTA or MRA. This recommendation follows 2010 ACCF/AHA/AATS/ACR/ASA/SCA/SCAI/SIR/STS/SVM Guidelines for the Diagnosis and Management of Patients with Thoracic Aortic Disease. Circulation. 2010; 121: [id number] 2. Atherosclerosis, including aortic and coronary  artery disease. Please note that although the presence of coronary artery calcium documents the presence of coronary artery disease, the severity of this disease and any potential stenosis cannot be assessed on this non-gated CT examination. Assessment for potential risk factor modification, dietary therapy or pharmacologic therapy may be warranted, if clinically indicated. Electronically Signed   By: Lucrezia Europe M.D.   On: 09/25/2015 12:37    Management plans discussed with the patient, family and they are in agreement.  CODE STATUS:  Code Status History    Date Active Date Inactive Code Status Order ID Comments User Context   09/25/2015  3:53 PM 09/26/2015  1:12 PM Full Code [id number]  Bettey Costa, MD ED      TOTAL TIME TAKING CARE OF THIS PATIENT: 35 minutes.    Loletha Grayer M.D on 09/26/2015 at 3:32 PM  Between 7am to 6pm - Pager -  [phone]  After 6pm go to [email or web address] - password [email or web address] [facility]  Office  [phone]  CC: Primary care physician; Marden Noble, MD

## 2015-09-26 NOTE — Progress Notes (Signed)
Pt. Discharged to home via wc. Discharge instructions and medication regimen reviewed at bedside with patient and friend. Both verbalize understanding of instructions and medication regimen. Prescriptions included with discharge papers. Patient assessment unchanged from this morning. TELE and IV discontinued per policy.

## 2015-09-26 NOTE — Discharge Instructions (Signed)

## 2015-09-26 NOTE — Progress Notes (Signed)
Per Dr. Leslye Peer, patient can get a cardiology referral at appt with Dr. Brynda Greathouse tomorrow. Will provide patient with Dr. Erich Montane preferred cardiologists information [facility]).

## 2015-09-27 LAB — GLUCOSE, CAPILLARY: GLUCOSE-CAPILLARY: 192 mg/dL — AB (ref 65–99)

## 2015-09-28 LAB — PROTEIN ELECTROPHORESIS, SERUM
A/G Ratio: 1 (ref 0.7–1.7)
ALBUMIN ELP: 3.1 g/dL (ref 2.9–4.4)
ALPHA-1-GLOBULIN: 0.2 g/dL (ref 0.0–0.4)
ALPHA-2-GLOBULIN: 0.7 g/dL (ref 0.4–1.0)
BETA GLOBULIN: 0.8 g/dL (ref 0.7–1.3)
GAMMA GLOBULIN: 1.5 g/dL (ref 0.4–1.8)
Globulin, Total: 3.2 g/dL (ref 2.2–3.9)
Total Protein ELP: 6.3 g/dL (ref 6.0–8.5)

## 2015-10-08 ENCOUNTER — Emergency Department
Admission: EM | Admit: 2015-10-08 | Discharge: 2015-10-08 | Disposition: A | Discharge status: Home / Self Care | Payer: Medicare Other | Source: Home / Self Care

## 2015-10-08 ENCOUNTER — Encounter: Payer: Self-pay | Admitting: Emergency Medicine

## 2015-10-08 ENCOUNTER — Emergency Department
Admission: EM | Admit: 2015-10-08 | Discharge: 2015-10-09 | Disposition: A | Discharge status: Home / Self Care | Payer: Medicare Other | Attending: Emergency Medicine | Admitting: Emergency Medicine

## 2015-10-08 DIAGNOSIS — Z79899 Other long term (current) drug therapy: Secondary | ICD-10-CM | POA: Insufficient documentation

## 2015-10-08 DIAGNOSIS — E119 Type 2 diabetes mellitus without complications: Secondary | ICD-10-CM

## 2015-10-08 DIAGNOSIS — R112 Nausea with vomiting, unspecified: Secondary | ICD-10-CM

## 2015-10-08 DIAGNOSIS — F17200 Nicotine dependence, unspecified, uncomplicated: Secondary | ICD-10-CM

## 2015-10-08 DIAGNOSIS — Z794 Long term (current) use of insulin: Secondary | ICD-10-CM | POA: Insufficient documentation

## 2015-10-08 DIAGNOSIS — N186 End stage renal disease: Secondary | ICD-10-CM | POA: Insufficient documentation

## 2015-10-08 DIAGNOSIS — Z85528 Personal history of other malignant neoplasm of kidney: Secondary | ICD-10-CM | POA: Insufficient documentation

## 2015-10-08 DIAGNOSIS — Z992 Dependence on renal dialysis: Secondary | ICD-10-CM | POA: Insufficient documentation

## 2015-10-08 DIAGNOSIS — Z872 Personal history of diseases of the skin and subcutaneous tissue: Secondary | ICD-10-CM | POA: Insufficient documentation

## 2015-10-08 DIAGNOSIS — C801 Malignant (primary) neoplasm, unspecified: Secondary | ICD-10-CM | POA: Insufficient documentation

## 2015-10-08 DIAGNOSIS — E1122 Type 2 diabetes mellitus with diabetic chronic kidney disease: Secondary | ICD-10-CM | POA: Insufficient documentation

## 2015-10-08 DIAGNOSIS — Z905 Acquired absence of kidney: Secondary | ICD-10-CM | POA: Insufficient documentation

## 2015-10-08 DIAGNOSIS — K529 Noninfective gastroenteritis and colitis, unspecified: Secondary | ICD-10-CM | POA: Insufficient documentation

## 2015-10-08 DIAGNOSIS — I120 Hypertensive chronic kidney disease with stage 5 chronic kidney disease or end stage renal disease: Secondary | ICD-10-CM

## 2015-10-08 DIAGNOSIS — R197 Diarrhea, unspecified: Secondary | ICD-10-CM

## 2015-10-08 DIAGNOSIS — Z5321 Procedure and treatment not carried out due to patient leaving prior to being seen by health care provider: Secondary | ICD-10-CM | POA: Insufficient documentation

## 2015-10-08 DIAGNOSIS — R1030 Lower abdominal pain, unspecified: Secondary | ICD-10-CM | POA: Diagnosis present

## 2015-10-08 HISTORY — DX: Dependence on renal dialysis: Z99.2

## 2015-10-08 LAB — COMPREHENSIVE METABOLIC PANEL
ALBUMIN: 3.7 g/dL (ref 3.5–5.0)
ALT: 29 U/L (ref 17–63)
ALT: 29 U/L (ref 17–63)
AST: 25 U/L (ref 15–41)
AST: 22 U/L (ref 15–41)
Albumin: 3.8 g/dL (ref 3.5–5.0)
Alkaline Phosphatase: 92 U/L (ref 38–126)
Alkaline Phosphatase: 94 U/L (ref 38–126)
Anion gap: 4 — ABNORMAL LOW (ref 5–15)
Anion gap: 5 (ref 5–15)
BILIRUBIN TOTAL: 0.4 mg/dL (ref 0.3–1.2)
BUN: 43 mg/dL — AB (ref 6–20)
BUN: 51 mg/dL — AB (ref 6–20)
CHLORIDE: 99 mmol/L — AB (ref 101–111)
CO2: 32 mmol/L (ref 22–32)
CO2: 31 mmol/L (ref 22–32)
CREATININE: 10.98 mg/dL — AB (ref 0.61–1.24)
Calcium: 9 mg/dL (ref 8.9–10.3)
Calcium: 8.9 mg/dL (ref 8.9–10.3)
Chloride: 99 mmol/L — ABNORMAL LOW (ref 101–111)
Creatinine, Ser: 12.11 mg/dL — ABNORMAL HIGH (ref 0.61–1.24)
GFR calc Af Amer: 5 mL/min — ABNORMAL LOW (ref >60)
GFR calc Af Amer: 4 mL/min — ABNORMAL LOW (ref >60)
GFR, EST NON AFRICAN AMERICAN: 4 mL/min — AB (ref >60)
GFR, EST NON AFRICAN AMERICAN: 4 mL/min — AB (ref >60)
GLUCOSE: 211 mg/dL — AB (ref 65–99)
Glucose, Bld: 202 mg/dL — ABNORMAL HIGH (ref 65–99)
POTASSIUM: 4 mmol/L (ref 3.5–5.1)
Potassium: 4.3 mmol/L (ref 3.5–5.1)
SODIUM: 135 mmol/L (ref 135–145)
Sodium: 135 mmol/L (ref 135–145)
TOTAL PROTEIN: 7.6 g/dL (ref 6.5–8.1)
Total Bilirubin: 0.5 mg/dL (ref 0.3–1.2)
Total Protein: 7.5 g/dL (ref 6.5–8.1)

## 2015-10-08 LAB — CBC
HCT: 32.5 % — ABNORMAL LOW (ref 40.0–52.0)
HEMATOCRIT: 33.7 % — AB (ref 40.0–52.0)
HEMOGLOBIN: 11.6 g/dL — AB (ref 13.0–18.0)
Hemoglobin: 11.1 g/dL — ABNORMAL LOW (ref 13.0–18.0)
MCH: 31.1 pg (ref 26.0–34.0)
MCH: 32 pg (ref 26.0–34.0)
MCHC: 34.1 g/dL (ref 32.0–36.0)
MCHC: 34.4 g/dL (ref 32.0–36.0)
MCV: 91 fL (ref 80.0–100.0)
MCV: 92.9 fL (ref 80.0–100.0)
PLATELETS: 140 10*3/uL — AB (ref 150–440)
Platelets: 147 10*3/uL — ABNORMAL LOW (ref 150–440)
RBC: 3.58 MIL/uL — ABNORMAL LOW (ref 4.40–5.90)
RBC: 3.63 MIL/uL — ABNORMAL LOW (ref 4.40–5.90)
RDW: 14.6 % — AB (ref 11.5–14.5)
RDW: 14.8 % — AB (ref 11.5–14.5)
WBC: 5.6 10*3/uL (ref 3.8–10.6)
WBC: 5.9 10*3/uL (ref 3.8–10.6)

## 2015-10-08 LAB — LIPASE, BLOOD: LIPASE: 27 U/L (ref 11–51)

## 2015-10-08 MED ORDER — ONDANSETRON 4 MG PO TBDP
ORAL_TABLET | ORAL | Status: AC
Start: 2015-10-08 — End: 2015-10-08
  Administered 2015-10-08: 4 mg via ORAL
  Filled 2015-10-08: qty 1

## 2015-10-08 MED ORDER — ONDANSETRON 4 MG PO TBDP
4.0000 mg | ORAL_TABLET | Freq: Once | ORAL | Status: AC | PRN
  Administered 2015-10-08: 4 mg via ORAL

## 2015-10-08 NOTE — ED Notes (Addendum)
Pt has had vomiting and diarrhea for 5-7 days.  Denies any pain at this time but has had umbilical pain; pt is dialysis pt.  Denies blood in vomit or stool.  Only makes urine once per day.

## 2015-10-08 NOTE — ED Notes (Signed)
Pt presents to ED with mid lower abd pain with vomiting, diarrhea. Pt with symptoms since Monday. Denies fever.

## 2015-10-08 NOTE — ED Provider Notes (Signed)
[facility] Emergency Department Provider Note  ____________________________________________  Time seen: Approximately 11:59 PM  I have reviewed the triage vital signs and the nursing notes.   HISTORY  Chief Complaint Abdominal Pain; Diarrhea; and Emesis    HPI MONTEGO SEEDORF is a 65 y.o. male history of end-stage renal disease, ascending aortic aneurysm, diabetes, renal cancer.  Patient reports that he's been having occasional vomiting as well as loose watery stools about 2-3 times per day for the last 5-6 days. He states that occasionally he gets pain with this located in the mid to upper abdomen, he has not been able to eat as well and feels slightly dehydrated. He attended dialysis today, he vomited once during his run.  The present time he reports an achy crampy feeling in the upper abdomen.  Reports his last loose watery bowel movement was about 30 minutes ago. He has not had any recent blood or black stools. He does report that he was hospitalized for chest pain and discharged without a clear diagnosis for the cause about 2 weeks ago. He has not had any further chest pain.     Past Medical History  Diagnosis Date  . ESRD (end stage renal disease) ([place])   . Hypertension   . Diabetes mellitus without complication ([place])   . Cancer ([place])     right kidney  . History of gangrene   . Dialysis patient [facility])     Patient Active Problem List   Diagnosis Date Noted  . Chest pain 09/25/2015    Past Surgical History  Procedure Laterality Date  . Nephrectomy    . Knee surgery      Current Outpatient Rx  Name  Route  Sig  Dispense  Refill  . diphenoxylate-atropine (LOMOTIL) 2.5-0.025 MG tablet   Oral   Take 1 tablet by mouth 3 (three) times daily.      0   . Ferric Citrate (AURYXIA) 1 GM 210 MG(Fe) TABS   Oral   Take 3 tablets by mouth 2 (two) times daily with a meal.         . furosemide (LASIX) 80 MG tablet   Oral   Take 80 mg by  mouth 3 (three) times a week. Take on Tuesday, Thursday, and Saturday.      3   . gabapentin (NEURONTIN) 100 MG capsule      One tablet at night for three nights, if still having pain then go up to two tablets every night   60 capsule   0   . HYDROcodone-acetaminophen (NORCO/VICODIN) 5-325 MG tablet   Oral   Take 1 tablet by mouth every 4 (four) hours as needed for moderate pain.   30 tablet   0   . LEVEMIR FLEXTOUCH 100 UNIT/ML Pen   Subcutaneous   Inject 25 Units into the skin at bedtime.      11     Dispense as written.   . lidocaine-prilocaine (EMLA) cream   Topical   Apply 1 application topically every Monday, Wednesday, and Friday. Apply 2 hours before dialysis.         Marland Kitchen metoprolol tartrate (LOPRESSOR) 25 MG tablet   Oral   Take 1 tablet (25 mg total) by mouth 2 (two) times daily.   60 tablet   0   . ondansetron (ZOFRAN ODT) 4 MG disintegrating tablet   Oral   Take 1 tablet (4 mg total) by mouth every 6 (six) hours as needed for nausea  or vomiting.   20 tablet   0   . sevelamer carbonate (RENVELA) 800 MG tablet   Oral   Take 2,400 mg by mouth 2 (two) times daily with a meal.           Allergies Review of patient's allergies indicates no known allergies.  History reviewed. No pertinent family history.  Social History Social History  Substance Use Topics  . Smoking status: Current Some Day Smoker  . Smokeless tobacco: None  . Alcohol Use: No    Review of Systems Constitutional: No fever/chills Eyes: No visual changes. ENT: No sore throat. Cardiovascular: Denies chest pain. Respiratory: Denies shortness of breath. Gastrointestinal:  No constipation. Genitourinary: On dialysis Musculoskeletal: Negative for back pain. Skin: Negative for rash. Neurological: Negative for headaches, focal weakness or numbness. Had last dialysis this morning. 10-point ROS otherwise negative.  ____________________________________________   PHYSICAL  EXAM:  VITAL SIGNS: ED Triage Vitals  Enc Vitals Group     BP 10/08/15 2133 160/66 mmHg     Pulse Rate 10/08/15 2133 97     Resp 10/08/15 2133 18     Temp 10/08/15 2133 99.1 F (37.3 C)     Temp Source 10/08/15 2133 Oral     SpO2 10/08/15 2133 96 %     Weight 10/08/15 2133 270 lb (122.471 kg)     Height 10/08/15 2133 6\' 4"  (1.93 m)     Head Cir --      Peak Flow --      Pain Score 10/08/15 2134 9     Pain Loc --      Pain Edu? --      Excl. in [place]? --    Constitutional: Alert and oriented. Well appearing and in no acute distress. Eyes: Conjunctivae are normal. PERRL. EOMI. Head: Atraumatic. Nose: No congestion/rhinnorhea. Mouth/Throat: Mucous membranes are Slightly dry.  Oropharynx non-erythematous. Neck: No stridor.   Cardiovascular: Normal rate, regular rhythm. Grossly normal heart sounds.  Good peripheral circulation. Respiratory: Normal respiratory effort.  No retractions. Lungs CTAB. Gastrointestinal: Soft and nontender Except for mild tenderness focally in the epigastrium. No distention. No abdominal bruits. No CVA tenderness. No rebound or guarding. Negative Murphy. No pain at McBurney's point. Musculoskeletal: No lower extremity tenderness nor edema.  No joint effusions. Left forearm fistula, normal in appearance with thrill. Neurologic:  Normal speech and language. No gross focal neurologic deficits are appreciated. Skin:  Skin is warm, dry and intact. No rash noted. Psychiatric: Mood and affect are normal. Speech and behavior are normal.  ____________________________________________   LABS (all labs ordered are listed, but only abnormal results are displayed)  Labs Reviewed  COMPREHENSIVE METABOLIC PANEL - Abnormal; Notable for the following:    Chloride 99 (*)    Glucose, Bld 211 (*)    BUN 51 (*)    Creatinine, Ser 12.11 (*)    GFR calc non Af Amer 4 (*)    GFR calc Af Amer 4 (*)    All other components within normal limits  CBC - Abnormal; Notable for the  following:    RBC 3.63 (*)    Hemoglobin 11.6 (*)    HCT 33.7 (*)    RDW 14.8 (*)    Platelets 147 (*)    All other components within normal limits  LIPASE, BLOOD - Abnormal; Notable for the following:    Lipase 61 (*)    All other components within normal limits  TROPONIN I - Abnormal; Notable for the following:  Troponin I 0.04 (*)    All other components within normal limits  GASTROINTESTINAL PANEL BY PCR, STOOL (REPLACES STOOL CULTURE)  C DIFFICILE QUICK SCREEN W PCR REFLEX   ____________________________________________  EKG  Reviewed and interpreted by me at 11:40 PM Normal sinus rhythm, normal intervals, slight J-point elevation noted in V2, no evidence of acute ST elevation MI. Compared with the patient's previous EKG there seems to be slight J-point elevation also noted from 09/25/2015 in similar distribution. No evidence of acute ischemic change ____________________________________________  RADIOLOGY  CT Abdomen Pelvis Wo Contrast (Final result) Result time: 10/09/15 02:32:09   Final result by Rad Results In Interface (10/09/15 02:32:09)   Narrative:   CLINICAL DATA: Acute onset of mid to lower abdominal pain and vomiting. Diarrhea. Initial encounter.  EXAM: CT ABDOMEN AND PELVIS WITHOUT CONTRAST  TECHNIQUE: Multidetector CT imaging of the abdomen and pelvis was performed following the standard protocol without IV contrast.  COMPARISON: CT of the abdomen and pelvis from 08/13/2011  FINDINGS: Minimal bibasilar atelectasis is noted. Scattered coronary artery calcifications are seen.  The liver and spleen are unremarkable in appearance. The gallbladder is within normal limits. The pancreas and adrenal glands are unremarkable.  The patient is status post right-sided nephrectomy. Scattered left renal cysts are noted. Nonspecific left-sided perinephric stranding is seen. There is no evidence of hydronephrosis. No renal or ureteral stones are  identified.  No free fluid is identified. The small bowel is unremarkable in appearance. The stomach is within normal limits. No acute vascular abnormalities are seen. Mild calcification is seen along the abdominal aorta and its branches. Postoperative change is noted about the IVC.  The appendix is normal in caliber and contains air, without evidence of appendicitis. The colon is unremarkable in appearance.  The bladder is decompressed, with apparent bladder wall thickening, which may reflect cystitis or possibly relative decompression. The prostate is enlarged, measuring 5.4 cm in transverse dimension. No inguinal lymphadenopathy is seen.  No acute osseous abnormalities are identified. 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 Multilevel vacuum phenomenon is noted along the lumbar spine.  IMPRESSION: 1. Apparent bladder wall thickening could reflect cystitis, or may simply reflect relative decompression. 2. Enlarged prostate noted. 3. Scattered left renal cysts seen. 4. Scattered coronary artery calcifications seen. 5. Mild calcification along the abdominal aorta and its branches. 6. Chronic bilateral pars defects at L5, with mild grade 1 anterolisthesis of L5 on S1.   Electronically Signed By: Garald Balding M.D. On: 10/09/2015 02:32       ____________________________________________   PROCEDURES  Procedure(s) performed: None  Critical Care performed: No  ____________________________________________   INITIAL IMPRESSION / ASSESSMENT AND PLAN / ED COURSE  Pertinent labs & imaging results that were available during my care of the patient were reviewed by me and considered in my medical decision making (see chart for details).  Patient presents for nausea vomiting diarrhea, associated epigastric and mid abdominal discomfort. Does have some focal epigastric tenderness. Differential diagnosis includes but is  not limited to, abdominal perforation, aortic dissection, cholecystitis, appendicitis, diverticulitis, colitis, esophagitis/gastritis, kidney stone, pyelonephritis, urinary tract infection, aortic aneurysm. All are considered in decision and treatment plan. Based upon the patient's presentation and risk factors, as well as the patient's hemodialysis status we will obtain CT imaging with oral contrast to screen for acute abnormalities. The patient complains of intermittent crampy pain, I find no evidence suggest an acute vascular etiology by clinical history,  but likely suspect infectious gastroenteritis type symptoms though given location of his pain do wish to evaluate for subtle evidence of cholecystitis, gastritis, pancreatitis, etc. We will continue to monitor him closely. No signs of volume overload, we'll hydrate gently in the ER.  No cardiopulmonary symptoms.  ----------------------------------------- 4:29 AM on 10/09/2015 -----------------------------------------  Patient had one loose bowel movement in the ER. C. difficile and viral panel negative. Stool culture pending. At this point, suspect likely viral gastroenteritis, no evidence of acute bacterial infection. Afebrile, no leukocytosis, and CT reassuring. Patient tells me he does not make any urine, and therefore doubt acute urinary tract infection especially given his symptoms of diarrhea and nausea and vomiting. Patient appears well, no emesis in the ED. Vital signs stable.  Discussed the case and care with Dr. Abigail Butts who advises discharge, agrees with treatment with Imodium as needed, and reports that the nephrology service would be happy to follow-up on final culture results. The patient will continue on his normal dialysis regimen. Follow up with nephrology and primary care advised. Patient agreeable, careful abdominal pain return precautions advised.  ____________________________________________   FINAL CLINICAL IMPRESSION(S) / ED  DIAGNOSES  Final diagnoses:  Nausea vomiting and diarrhea  Gastroenteritis      Delman Kitten, MD 10/09/15 [phone]

## 2015-10-09 ENCOUNTER — Emergency Department: Payer: Medicare Other

## 2015-10-09 LAB — C DIFFICILE QUICK SCREEN W PCR REFLEX
C Diff antigen: NEGATIVE
C Diff interpretation: NEGATIVE
C Diff toxin: NEGATIVE

## 2015-10-09 LAB — GASTROINTESTINAL PANEL BY PCR, STOOL (REPLACES STOOL CULTURE)
ASTROVIRUS: NOT DETECTED
Adenovirus F40/41: NOT DETECTED
CYCLOSPORA CAYETANENSIS: NOT DETECTED
Campylobacter species: NOT DETECTED
Cryptosporidium: NOT DETECTED
E. COLI O157: NOT DETECTED
ENTEROAGGREGATIVE E COLI (EAEC): NOT DETECTED
ENTEROTOXIGENIC E COLI (ETEC): NOT DETECTED
Entamoeba histolytica: NOT DETECTED
Enteropathogenic E coli (EPEC): NOT DETECTED
GIARDIA LAMBLIA: NOT DETECTED
Norovirus GI/GII: NOT DETECTED
Plesimonas shigelloides: NOT DETECTED
Rotavirus A: NOT DETECTED
SALMONELLA SPECIES: NOT DETECTED
SAPOVIRUS (I, II, IV, AND V): NOT DETECTED
Shiga like toxin producing E coli (STEC): NOT DETECTED
Shigella/Enteroinvasive E coli (EIEC): NOT DETECTED
VIBRIO CHOLERAE: NOT DETECTED
VIBRIO SPECIES: NOT DETECTED
Yersinia enterocolitica: NOT DETECTED

## 2015-10-09 LAB — LIPASE, BLOOD: Lipase: 61 U/L — ABNORMAL HIGH (ref 11–51)

## 2015-10-09 LAB — TROPONIN I: TROPONIN I: 0.04 ng/mL — AB (ref <0.031)

## 2015-10-09 MED ORDER — DIATRIZOATE MEGLUMINE & SODIUM 66-10 % PO SOLN
15.0000 mL | Freq: Once | ORAL | Status: AC
  Administered 2015-10-09: 30 mL via ORAL

## 2015-10-09 MED ORDER — SODIUM CHLORIDE 0.9 % IV BOLUS (SEPSIS)
500.0000 mL | Freq: Once | INTRAVENOUS | Status: AC
  Administered 2015-10-09: 500 mL via INTRAVENOUS

## 2015-10-09 MED ORDER — ONDANSETRON HCL 4 MG/2ML IJ SOLN
4.0000 mg | Freq: Once | INTRAMUSCULAR | Status: AC
  Administered 2015-10-09: 4 mg via INTRAVENOUS
  Filled 2015-10-09: qty 2

## 2015-10-09 MED ORDER — ONDANSETRON 4 MG PO TBDP: 4.0000 mg | ORAL_TABLET | Freq: Four times a day (QID) | ORAL | Status: DC | PRN

## 2015-10-09 NOTE — ED Notes (Signed)
Patient transported to CT 

## 2015-10-09 NOTE — ED Notes (Signed)
Discussed IV options with patient, AC placement preferred. 

## 2015-10-09 NOTE — ED Notes (Signed)
Report from David, RN

## 2015-10-09 NOTE — ED Notes (Signed)
Pt requesting coffee. States that it will not cause him anymore diarrhea than he already has. Pt alert and oriented X4, active, cooperative, pt in NAD. RR even and unlabored, color WNL.

## 2015-10-09 NOTE — ED Notes (Signed)
Pt alert and oriented X4, active, cooperative, pt in NAD. RR even and unlabored, color WNL.  Pt informed to return if any life threatening symptoms occur.   

## 2015-10-09 NOTE — Discharge Instructions (Signed)
You were seen in the emergency room for abdominal pain. It is important that you follow up closely with your primary care doctor in the next couple of days.  Continue your regular dialysis schedule.  Please return to the emergency room right away if you are to develop a fever, severe nausea, your pain becomes severe or worsens, you are unable to keep food down, begin vomiting any dark or bloody fluid, you develop any dark or bloody stools, feel dehydrated, or other new concerns or symptoms arise.   Diarrhea Diarrhea is frequent loose and watery bowel movements. It can cause you to feel weak and dehydrated. Dehydration can cause you to become tired and thirsty, have a dry mouth, and have decreased urination that often is dark yellow. Diarrhea is a sign of another problem, most often an infection that will not last long. In most cases, diarrhea typically lasts 2-3 days. However, it can last longer if it is a sign of something more serious. It is important to treat your diarrhea as directed by your caregiver to lessen or prevent future episodes of diarrhea. CAUSES  Some common causes include:  Gastrointestinal infections caused by viruses, bacteria, or parasites.  Food poisoning or food allergies.  Certain medicines, such as antibiotics, chemotherapy, and laxatives.  Artificial sweeteners and fructose.  Digestive disorders. HOME CARE INSTRUCTIONS  Ensure adequate fluid intake (hydration): Have 1 cup (8 oz) of fluid for each diarrhea episode. Avoid fluids that contain simple sugars or sports drinks, fruit juices, whole milk products, and sodas. Your urine should be clear or pale yellow if you are drinking enough fluids. Hydrate with an oral rehydration solution that you can purchase at pharmacies, retail stores, and online. You can prepare an oral rehydration solution at home by mixing the following ingredients together:   - tsp table salt.   tsp baking soda.   tsp salt substitute  containing potassium chloride.  1  tablespoons sugar.  1 L (34 oz) of water.  Certain foods and beverages may increase the speed at which food moves through the gastrointestinal (GI) tract. These foods and beverages should be avoided and include:  Caffeinated and alcoholic beverages.  High-fiber foods, such as raw fruits and vegetables, nuts, seeds, and whole grain breads and cereals.  Foods and beverages sweetened with sugar alcohols, such as xylitol, sorbitol, and mannitol.  Some foods may be well tolerated and may help thicken stool including:  Starchy foods, such as rice, toast, pasta, low-sugar cereal, oatmeal, grits, baked potatoes, crackers, and bagels.  Bananas.  Applesauce.  Add probiotic-rich foods to help increase healthy bacteria in the GI tract, such as yogurt and fermented milk products.  Wash your hands well after each diarrhea episode.  Only take over-the-counter or prescription medicines as directed by your caregiver.  Take a warm bath to relieve any burning or pain from frequent diarrhea episodes. SEEK IMMEDIATE MEDICAL CARE IF:   You are unable to keep fluids down.  You have persistent vomiting.  You have blood in your stool, or your stools are black and tarry.  You do not urinate in 6-8 hours, or there is only a small amount of very dark urine.  You have abdominal pain that increases or localizes.  You have weakness, dizziness, confusion, or light-headedness.  You have a severe headache.  Your diarrhea gets worse or does not get better.  You have a fever or persistent symptoms for more than 2-3 days.  You have a fever and your symptoms suddenly  get worse. MAKE SURE YOU:   Understand these instructions.  Will watch your condition.  Will get help right away if you are not doing well or get worse.   This information is not intended to replace advice given to you by your health care provider. Make sure you discuss any questions you have with  your health care provider.   Document Released: 06/16/2002 Document Revised: 07/17/2014 Document Reviewed: 03/03/2012 Elsevier Interactive Patient Education [employer].

## 2015-12-16 ENCOUNTER — Ambulatory Visit: Payer: Medicare Other | Admitting: Anesthesiology

## 2015-12-16 ENCOUNTER — Encounter
Admission: RE | Disposition: A | Discharge status: Home / Self Care | Payer: Self-pay | Source: Ambulatory Visit | Attending: Unknown Physician Specialty

## 2015-12-16 ENCOUNTER — Encounter: Payer: Self-pay | Admitting: *Deleted

## 2015-12-16 ENCOUNTER — Ambulatory Visit
Admission: RE | Admit: 2015-12-16 | Discharge: 2015-12-16 | Disposition: A | Discharge status: Home / Self Care | Payer: Medicare Other | Source: Ambulatory Visit | Attending: Unknown Physician Specialty | Admitting: Unknown Physician Specialty

## 2015-12-16 DIAGNOSIS — Z794 Long term (current) use of insulin: Secondary | ICD-10-CM | POA: Insufficient documentation

## 2015-12-16 DIAGNOSIS — Z800 Family history of malignant neoplasm of digestive organs: Secondary | ICD-10-CM | POA: Insufficient documentation

## 2015-12-16 DIAGNOSIS — D122 Benign neoplasm of ascending colon: Secondary | ICD-10-CM | POA: Diagnosis not present

## 2015-12-16 DIAGNOSIS — Z6832 Body mass index (BMI) 32.0-32.9, adult: Secondary | ICD-10-CM | POA: Diagnosis not present

## 2015-12-16 DIAGNOSIS — Z85528 Personal history of other malignant neoplasm of kidney: Secondary | ICD-10-CM | POA: Diagnosis not present

## 2015-12-16 DIAGNOSIS — D124 Benign neoplasm of descending colon: Secondary | ICD-10-CM | POA: Diagnosis not present

## 2015-12-16 DIAGNOSIS — Z992 Dependence on renal dialysis: Secondary | ICD-10-CM | POA: Diagnosis not present

## 2015-12-16 DIAGNOSIS — N186 End stage renal disease: Secondary | ICD-10-CM | POA: Insufficient documentation

## 2015-12-16 DIAGNOSIS — E1122 Type 2 diabetes mellitus with diabetic chronic kidney disease: Secondary | ICD-10-CM | POA: Insufficient documentation

## 2015-12-16 DIAGNOSIS — F17200 Nicotine dependence, unspecified, uncomplicated: Secondary | ICD-10-CM | POA: Diagnosis not present

## 2015-12-16 DIAGNOSIS — I120 Hypertensive chronic kidney disease with stage 5 chronic kidney disease or end stage renal disease: Secondary | ICD-10-CM | POA: Insufficient documentation

## 2015-12-16 DIAGNOSIS — K640 First degree hemorrhoids: Secondary | ICD-10-CM | POA: Diagnosis not present

## 2015-12-16 DIAGNOSIS — D123 Benign neoplasm of transverse colon: Secondary | ICD-10-CM | POA: Diagnosis not present

## 2015-12-16 DIAGNOSIS — Z79899 Other long term (current) drug therapy: Secondary | ICD-10-CM | POA: Diagnosis not present

## 2015-12-16 DIAGNOSIS — Z1211 Encounter for screening for malignant neoplasm of colon: Secondary | ICD-10-CM | POA: Insufficient documentation

## 2015-12-16 DIAGNOSIS — Z905 Acquired absence of kidney: Secondary | ICD-10-CM | POA: Diagnosis not present

## 2015-12-16 HISTORY — PX: COLONOSCOPY WITH PROPOFOL: SHX5780

## 2015-12-16 LAB — GLUCOSE, CAPILLARY: GLUCOSE-CAPILLARY: 170 mg/dL — AB (ref 65–99)

## 2015-12-16 SURGERY — COLONOSCOPY WITH PROPOFOL
Anesthesia: General

## 2015-12-16 MED ORDER — PROPOFOL 500 MG/50ML IV EMUL
INTRAVENOUS | Status: DC | PRN
  Administered 2015-12-16: 200 ug/kg/min via INTRAVENOUS

## 2015-12-16 MED ORDER — SODIUM CHLORIDE 0.9 % IV SOLN
INTRAVENOUS | Status: DC
  Administered 2015-12-16: 07:00:00 via INTRAVENOUS

## 2015-12-16 MED ORDER — MIDAZOLAM HCL 5 MG/5ML IJ SOLN
INTRAMUSCULAR | Status: DC | PRN
  Administered 2015-12-16: 1 mg via INTRAVENOUS

## 2015-12-16 MED ORDER — PIPERACILLIN SOD-TAZOBACTAM SO 2.25 (2-0.25) G IV SOLR
3.3750 g | INTRAVENOUS | Status: AC
  Administered 2015-12-16: 3.375 g via INTRAVENOUS
  Filled 2015-12-16: qty 3.38

## 2015-12-16 MED ORDER — PROPOFOL 10 MG/ML IV BOLUS
INTRAVENOUS | Status: DC | PRN
  Administered 2015-12-16: 100 mg via INTRAVENOUS

## 2015-12-16 MED ORDER — FENTANYL CITRATE (PF) 100 MCG/2ML IJ SOLN
INTRAMUSCULAR | Status: DC | PRN
  Administered 2015-12-16: 50 ug via INTRAVENOUS

## 2015-12-16 MED ORDER — METOPROLOL TARTRATE 25 MG PO TABS
25.0000 mg | ORAL_TABLET | Freq: Once | ORAL | Status: AC
  Administered 2015-12-16: 25 mg via ORAL

## 2015-12-16 MED ORDER — METOPROLOL TARTRATE 25 MG PO TABS
ORAL_TABLET | ORAL | Status: AC
  Administered 2015-12-16: 25 mg via ORAL
  Filled 2015-12-16: qty 1

## 2015-12-16 MED ORDER — LIDOCAINE 2% (20 MG/ML) 5 ML SYRINGE
INTRAMUSCULAR | Status: DC | PRN
  Administered 2015-12-16: 40 mg via INTRAVENOUS

## 2015-12-16 MED ORDER — SODIUM CHLORIDE 0.9 % IV SOLN: INTRAVENOUS | Status: DC

## 2015-12-16 NOTE — H&P (Signed)
Primary Care Physician:  Marden Noble, MD Primary Gastroenterologist:  Dr. Vira Agar  Pre-Procedure History & Physical: HPI:  Timothy Gibson is a 65 y.o. male is here for an colonoscopy.   Past Medical History  Diagnosis Date  . ESRD (end stage renal disease) ([place])   . Hypertension   . Diabetes mellitus without complication ([place])   . Cancer ([place])     right kidney  . History of gangrene   . Dialysis patient [facility])     Past Surgical History  Procedure Laterality Date  . Nephrectomy    . Knee surgery      Prior to Admission medications   Medication Sig Start Date End Date Taking? Authorizing Provider  Ferric Citrate (AURYXIA) 1 GM 210 MG(Fe) TABS Take 3 tablets by mouth 2 (two) times daily with a meal.   Yes Historical Provider, MD  furosemide (LASIX) 80 MG tablet Take 80 mg by mouth 3 (three) times a week. Take on Tuesday, Thursday, and Saturday. 07/30/15  Yes Historical Provider, MD  gabapentin (NEURONTIN) 100 MG capsule One tablet at night for three nights, if still having pain then go up to two tablets every night 09/26/15  Yes Loletha Grayer, MD  metoprolol tartrate (LOPRESSOR) 25 MG tablet Take 1 tablet (25 mg total) by mouth 2 (two) times daily. 09/26/15  Yes Loletha Grayer, MD  sevelamer carbonate (RENVELA) 800 MG tablet Take 2,400 mg by mouth 2 (two) times daily with a meal.   Yes Historical Provider, MD  diphenoxylate-atropine (LOMOTIL) 2.5-0.025 MG tablet Take 1 tablet by mouth 3 (three) times daily. 10/06/15   Historical Provider, MD  HYDROcodone-acetaminophen (NORCO/VICODIN) 5-325 MG tablet Take 1 tablet by mouth every 4 (four) hours as needed for moderate pain. 09/26/15   Loletha Grayer, MD  LEVEMIR FLEXTOUCH 100 UNIT/ML Pen Inject 25 Units into the skin at bedtime. 07/10/15   Historical Provider, MD  lidocaine-prilocaine (EMLA) cream Apply 1 application topically every Monday, Wednesday, and Friday. Apply 2 hours before dialysis. 05/23/10   Historical Provider, MD   ondansetron (ZOFRAN ODT) 4 MG disintegrating tablet Take 1 tablet (4 mg total) by mouth every 6 (six) hours as needed for nausea or vomiting. 10/09/15   Delman Kitten, MD    Allergies as of 12/02/2015  . (No Known Allergies)    History reviewed. No pertinent family history.  Social History   Social History  . Marital Status: Single    Spouse Name: N/A  . Number of Children: N/A  . Years of Education: N/A   Occupational History  . Not on file.   Social History Main Topics  . Smoking status: Current Some Day Smoker  . Smokeless tobacco: Not on file  . Alcohol Use: No  . Drug Use: No  . Sexual Activity: Not on file   Other Topics Concern  . Not on file   Social History Narrative    Review of Systems: See HPI, otherwise negative ROS  Physical Exam: BP 189/81 mmHg  Pulse 83  Temp(Src) 98.1 F (36.7 C) (Tympanic)  Resp 18  Ht 6\' 4"  (1.93 m)  Wt 122.471 kg (270 lb)  BMI 32.88 kg/m2  SpO2 98% General:   Alert,  pleasant and cooperative in NAD Head:  Normocephalic and atraumatic. Neck:  Supple; no masses or thyromegaly. Lungs:  Clear throughout to auscultation.    Heart:  Regular rate and rhythm.  Has pacemaker Abdomen:  Soft, nontender and nondistended. Normal bowel sounds, without guarding, and without  rebound.   Neurologic:  Alert and  oriented x4;  grossly normal neurologically.  Impression/Plan: Timothy Gibson is here for an colonoscopy to be performed for FH colon cancer  Risks, benefits, limitations, and alternatives regarding  colonoscopy have been reviewed with the patient.  Questions have been answered.  All parties agreeable.   Gaylyn Cheers, MD  12/16/2015, 7:30 AM

## 2015-12-16 NOTE — Anesthesia Postprocedure Evaluation (Signed)
Anesthesia Post Note  Patient: Timothy Gibson  Procedure(s) Performed: Procedure(s) (LRB): COLONOSCOPY WITH PROPOFOL (N/A)  Patient location during evaluation: PACU Anesthesia Type: General Level of consciousness: awake and alert Pain management: pain level controlled Vital Signs Assessment: post-procedure vital signs reviewed and stable Respiratory status: spontaneous breathing, nonlabored ventilation, respiratory function stable and patient connected to nasal cannula oxygen Cardiovascular status: blood pressure returned to baseline and stable Postop Assessment: no signs of nausea or vomiting Anesthetic complications: no    Last Vitals:  Filed Vitals:   12/16/15 0700 12/16/15 0814  BP: 189/81 117/48  Pulse: 83 71  Temp: 36.7 C 36.1 C  Resp: 18 18    Last Pain: There were no vitals filed for this visit.               Molli Barrows

## 2015-12-16 NOTE — Anesthesia Preprocedure Evaluation (Signed)
Anesthesia Evaluation  Patient identified by MRN, date of birth, ID band Patient awake    Reviewed: Allergy & Precautions, H&P , NPO status , Patient's Chart, lab work & pertinent test results, reviewed documented beta blocker date and time   Airway Mallampati: III  TM Distance: >3 FB Neck ROM: full    Dental  (+) Poor Dentition, Teeth Intact   Pulmonary neg pulmonary ROS, Current Smoker,    Pulmonary exam normal        Cardiovascular hypertension, negative cardio ROS Normal cardiovascular exam Rhythm:regular Rate:Normal     Neuro/Psych negative neurological ROS  negative psych ROS   GI/Hepatic negative GI ROS, Neg liver ROS,   Endo/Other  negative endocrine ROSdiabetes, Well Controlled, Type 2Morbid obesity  Renal/GU DialysisRenal diseasenegative Renal ROS  negative genitourinary   Musculoskeletal   Abdominal   Peds  Hematology negative hematology ROS (+)   Anesthesia Other Findings Past Medical History:   ESRD (end stage renal disease) ([place])                         Hypertension                                                 Diabetes mellitus without complication ([place])                 Cancer ([place])                                                   Comment:right kidney   History of gangrene                                          Dialysis patient [facility])                                     Past Surgical History:   NEPHRECTOMY                                                   KNEE SURGERY                                                BMI    Body Mass Index   32.87 kg/m 2     Reproductive/Obstetrics                             Anesthesia Physical Anesthesia Plan  ASA: III  Anesthesia Plan: General   Post-op Pain Management:    Induction:   Airway Management Planned:   Additional Equipment:   Intra-op Plan:   Post-operative Plan:   Informed Consent: I have reviewed the  patients History and  Physical, chart, labs and discussed the procedure including the risks, benefits and alternatives for the proposed anesthesia with the patient or authorized representative who has indicated his/her understanding and acceptance.   Dental Advisory Given  Plan Discussed with: CRNA  Anesthesia Plan Comments:         Anesthesia Quick Evaluation

## 2015-12-16 NOTE — Transfer of Care (Signed)
Immediate Anesthesia Transfer of Care Note  Patient: Timothy Gibson  Procedure(s) Performed: Procedure(s): COLONOSCOPY WITH PROPOFOL (N/A)  Patient Location: PACU and Endoscopy Unit  Anesthesia Type:General  Level of Consciousness: awake and patient cooperative  Airway & Oxygen Therapy: Patient Spontanous Breathing and Patient connected to nasal cannula oxygen  Post-op Assessment: Report given to RN and Post -op Vital signs reviewed and stable  Post vital signs: Reviewed and stable  Last Vitals:  Filed Vitals:   12/16/15 0700  BP: 189/81  Pulse: 83  Temp: 36.7 C  Resp: 18    Last Pain: There were no vitals filed for this visit.       Complications: No apparent anesthesia complications

## 2015-12-16 NOTE — Op Note (Signed)
[facility] Patient Name: Timothy Gibson Procedure Date: 12/16/2015 7:28 AM MRN: [id number] Account #: [id number] Date of Birth: 03/27/1951 Admit Type: Outpatient Age: 65 Room: [facility] ENDO ROOM 4 Gender: Male Note Status: Finalized Procedure:            Colonoscopy Indications:          Screening in patient at increased risk: Family history                        of 1st-degree relative with colorectal cancer Providers:            Manya Silvas, MD Referring MD:         Mikeal Hawthorne. Brynda Greathouse MD, MD (Referring MD) Medicines:            Propofol per Anesthesia Complications:        No immediate complications. Procedure:            Pre-Anesthesia Assessment:                       - After reviewing the risks and benefits, the patient                        was deemed in satisfactory condition to undergo the                        procedure.                       After obtaining informed consent, the colonoscope was                        passed under direct vision. Throughout the procedure,                        the patient's blood pressure, pulse, and oxygen                        saturations were monitored continuously. The                        Colonoscope was introduced through the anus and                        advanced to the the cecum, identified by appendiceal                        orifice and ileocecal valve. The colonoscopy was                        performed without difficulty. The patient tolerated the                        procedure well. The quality of the bowel preparation                        was good. Findings:      Three sessile polyps were found in the descending colon, transverse       colon and ascending colon. The polyps were small in size. These polyps       were removed with a hot snare. Resection and retrieval  were complete.      Internal hemorrhoids were found during endoscopy. The hemorrhoids were       small and Grade I  (internal hemorrhoids that do not prolapse).      The exam was otherwise without abnormality. Impression:           - Three small polyps in the descending colon, in the                        transverse colon and in the ascending colon, removed                        with a hot snare. Resected and retrieved.                       - Internal hemorrhoids.                       - The examination was otherwise normal. Recommendation:       - Await pathology results. Manya Silvas, MD 12/16/2015 8:07:11 AM This report has been signed electronically. Number of Addenda: 0 Note Initiated On: 12/16/2015 7:28 AM Scope Withdrawal Time: 0 hours 8 minutes 54 seconds  Total Procedure Duration: 0 hours 21 minutes 28 seconds       [facility]

## 2015-12-17 ENCOUNTER — Encounter: Payer: Self-pay | Admitting: Unknown Physician Specialty

## 2015-12-17 LAB — SURGICAL PATHOLOGY

## 2016-07-10 DIAGNOSIS — N2581 Secondary hyperparathyroidism of renal origin: Secondary | ICD-10-CM | POA: Diagnosis not present

## 2016-07-10 DIAGNOSIS — D631 Anemia in chronic kidney disease: Secondary | ICD-10-CM | POA: Diagnosis not present

## 2016-07-10 DIAGNOSIS — D509 Iron deficiency anemia, unspecified: Secondary | ICD-10-CM | POA: Diagnosis not present

## 2016-07-10 DIAGNOSIS — N186 End stage renal disease: Secondary | ICD-10-CM | POA: Diagnosis not present

## 2016-07-10 DIAGNOSIS — Z992 Dependence on renal dialysis: Secondary | ICD-10-CM | POA: Diagnosis not present

## 2016-07-12 DIAGNOSIS — N186 End stage renal disease: Secondary | ICD-10-CM | POA: Diagnosis not present

## 2016-07-12 DIAGNOSIS — D509 Iron deficiency anemia, unspecified: Secondary | ICD-10-CM | POA: Diagnosis not present

## 2016-07-12 DIAGNOSIS — N2581 Secondary hyperparathyroidism of renal origin: Secondary | ICD-10-CM | POA: Diagnosis not present

## 2016-07-12 DIAGNOSIS — Z992 Dependence on renal dialysis: Secondary | ICD-10-CM | POA: Diagnosis not present

## 2016-07-12 DIAGNOSIS — D631 Anemia in chronic kidney disease: Secondary | ICD-10-CM | POA: Diagnosis not present

## 2016-07-14 DIAGNOSIS — D631 Anemia in chronic kidney disease: Secondary | ICD-10-CM | POA: Diagnosis not present

## 2016-07-14 DIAGNOSIS — N186 End stage renal disease: Secondary | ICD-10-CM | POA: Diagnosis not present

## 2016-07-14 DIAGNOSIS — Z992 Dependence on renal dialysis: Secondary | ICD-10-CM | POA: Diagnosis not present

## 2016-07-14 DIAGNOSIS — N2581 Secondary hyperparathyroidism of renal origin: Secondary | ICD-10-CM | POA: Diagnosis not present

## 2016-07-14 DIAGNOSIS — D509 Iron deficiency anemia, unspecified: Secondary | ICD-10-CM | POA: Diagnosis not present

## 2016-07-17 DIAGNOSIS — D509 Iron deficiency anemia, unspecified: Secondary | ICD-10-CM | POA: Diagnosis not present

## 2016-07-17 DIAGNOSIS — Z992 Dependence on renal dialysis: Secondary | ICD-10-CM | POA: Diagnosis not present

## 2016-07-17 DIAGNOSIS — D631 Anemia in chronic kidney disease: Secondary | ICD-10-CM | POA: Diagnosis not present

## 2016-07-17 DIAGNOSIS — N2581 Secondary hyperparathyroidism of renal origin: Secondary | ICD-10-CM | POA: Diagnosis not present

## 2016-07-17 DIAGNOSIS — N186 End stage renal disease: Secondary | ICD-10-CM | POA: Diagnosis not present

## 2016-07-19 DIAGNOSIS — D631 Anemia in chronic kidney disease: Secondary | ICD-10-CM | POA: Diagnosis not present

## 2016-07-19 DIAGNOSIS — D509 Iron deficiency anemia, unspecified: Secondary | ICD-10-CM | POA: Diagnosis not present

## 2016-07-19 DIAGNOSIS — N186 End stage renal disease: Secondary | ICD-10-CM | POA: Diagnosis not present

## 2016-07-19 DIAGNOSIS — N2581 Secondary hyperparathyroidism of renal origin: Secondary | ICD-10-CM | POA: Diagnosis not present

## 2016-07-19 DIAGNOSIS — Z992 Dependence on renal dialysis: Secondary | ICD-10-CM | POA: Diagnosis not present

## 2016-07-21 DIAGNOSIS — D509 Iron deficiency anemia, unspecified: Secondary | ICD-10-CM | POA: Diagnosis not present

## 2016-07-21 DIAGNOSIS — N186 End stage renal disease: Secondary | ICD-10-CM | POA: Diagnosis not present

## 2016-07-21 DIAGNOSIS — Z992 Dependence on renal dialysis: Secondary | ICD-10-CM | POA: Diagnosis not present

## 2016-07-21 DIAGNOSIS — D631 Anemia in chronic kidney disease: Secondary | ICD-10-CM | POA: Diagnosis not present

## 2016-07-21 DIAGNOSIS — N2581 Secondary hyperparathyroidism of renal origin: Secondary | ICD-10-CM | POA: Diagnosis not present

## 2016-07-24 DIAGNOSIS — N2581 Secondary hyperparathyroidism of renal origin: Secondary | ICD-10-CM | POA: Diagnosis not present

## 2016-07-24 DIAGNOSIS — N186 End stage renal disease: Secondary | ICD-10-CM | POA: Diagnosis not present

## 2016-07-24 DIAGNOSIS — E119 Type 2 diabetes mellitus without complications: Secondary | ICD-10-CM | POA: Diagnosis not present

## 2016-07-24 DIAGNOSIS — D509 Iron deficiency anemia, unspecified: Secondary | ICD-10-CM | POA: Diagnosis not present

## 2016-07-24 DIAGNOSIS — Z992 Dependence on renal dialysis: Secondary | ICD-10-CM | POA: Diagnosis not present

## 2016-07-24 DIAGNOSIS — D631 Anemia in chronic kidney disease: Secondary | ICD-10-CM | POA: Diagnosis not present

## 2016-07-24 DIAGNOSIS — Z794 Long term (current) use of insulin: Secondary | ICD-10-CM | POA: Diagnosis not present

## 2016-07-26 DIAGNOSIS — D631 Anemia in chronic kidney disease: Secondary | ICD-10-CM | POA: Diagnosis not present

## 2016-07-26 DIAGNOSIS — N186 End stage renal disease: Secondary | ICD-10-CM | POA: Diagnosis not present

## 2016-07-26 DIAGNOSIS — Z992 Dependence on renal dialysis: Secondary | ICD-10-CM | POA: Diagnosis not present

## 2016-07-26 DIAGNOSIS — N2581 Secondary hyperparathyroidism of renal origin: Secondary | ICD-10-CM | POA: Diagnosis not present

## 2016-07-26 DIAGNOSIS — D509 Iron deficiency anemia, unspecified: Secondary | ICD-10-CM | POA: Diagnosis not present

## 2016-07-28 DIAGNOSIS — Z992 Dependence on renal dialysis: Secondary | ICD-10-CM | POA: Diagnosis not present

## 2016-07-28 DIAGNOSIS — N2581 Secondary hyperparathyroidism of renal origin: Secondary | ICD-10-CM | POA: Diagnosis not present

## 2016-07-28 DIAGNOSIS — D509 Iron deficiency anemia, unspecified: Secondary | ICD-10-CM | POA: Diagnosis not present

## 2016-07-28 DIAGNOSIS — D631 Anemia in chronic kidney disease: Secondary | ICD-10-CM | POA: Diagnosis not present

## 2016-07-28 DIAGNOSIS — N186 End stage renal disease: Secondary | ICD-10-CM | POA: Diagnosis not present

## 2016-07-31 DIAGNOSIS — D509 Iron deficiency anemia, unspecified: Secondary | ICD-10-CM | POA: Diagnosis not present

## 2016-07-31 DIAGNOSIS — Z992 Dependence on renal dialysis: Secondary | ICD-10-CM | POA: Diagnosis not present

## 2016-07-31 DIAGNOSIS — D631 Anemia in chronic kidney disease: Secondary | ICD-10-CM | POA: Diagnosis not present

## 2016-07-31 DIAGNOSIS — N186 End stage renal disease: Secondary | ICD-10-CM | POA: Diagnosis not present

## 2016-07-31 DIAGNOSIS — N2581 Secondary hyperparathyroidism of renal origin: Secondary | ICD-10-CM | POA: Diagnosis not present

## 2016-08-02 DIAGNOSIS — D509 Iron deficiency anemia, unspecified: Secondary | ICD-10-CM | POA: Diagnosis not present

## 2016-08-02 DIAGNOSIS — N2581 Secondary hyperparathyroidism of renal origin: Secondary | ICD-10-CM | POA: Diagnosis not present

## 2016-08-02 DIAGNOSIS — N186 End stage renal disease: Secondary | ICD-10-CM | POA: Diagnosis not present

## 2016-08-02 DIAGNOSIS — D631 Anemia in chronic kidney disease: Secondary | ICD-10-CM | POA: Diagnosis not present

## 2016-08-02 DIAGNOSIS — Z992 Dependence on renal dialysis: Secondary | ICD-10-CM | POA: Diagnosis not present

## 2016-08-04 DIAGNOSIS — N186 End stage renal disease: Secondary | ICD-10-CM | POA: Diagnosis not present

## 2016-08-04 DIAGNOSIS — D631 Anemia in chronic kidney disease: Secondary | ICD-10-CM | POA: Diagnosis not present

## 2016-08-04 DIAGNOSIS — D509 Iron deficiency anemia, unspecified: Secondary | ICD-10-CM | POA: Diagnosis not present

## 2016-08-04 DIAGNOSIS — N2581 Secondary hyperparathyroidism of renal origin: Secondary | ICD-10-CM | POA: Diagnosis not present

## 2016-08-04 DIAGNOSIS — Z992 Dependence on renal dialysis: Secondary | ICD-10-CM | POA: Diagnosis not present

## 2016-08-07 DIAGNOSIS — Z992 Dependence on renal dialysis: Secondary | ICD-10-CM | POA: Diagnosis not present

## 2016-08-07 DIAGNOSIS — D631 Anemia in chronic kidney disease: Secondary | ICD-10-CM | POA: Diagnosis not present

## 2016-08-07 DIAGNOSIS — D509 Iron deficiency anemia, unspecified: Secondary | ICD-10-CM | POA: Diagnosis not present

## 2016-08-07 DIAGNOSIS — N2581 Secondary hyperparathyroidism of renal origin: Secondary | ICD-10-CM | POA: Diagnosis not present

## 2016-08-07 DIAGNOSIS — N186 End stage renal disease: Secondary | ICD-10-CM | POA: Diagnosis not present

## 2016-08-09 DIAGNOSIS — D509 Iron deficiency anemia, unspecified: Secondary | ICD-10-CM | POA: Diagnosis not present

## 2016-08-09 DIAGNOSIS — N2581 Secondary hyperparathyroidism of renal origin: Secondary | ICD-10-CM | POA: Diagnosis not present

## 2016-08-09 DIAGNOSIS — Z992 Dependence on renal dialysis: Secondary | ICD-10-CM | POA: Diagnosis not present

## 2016-08-09 DIAGNOSIS — N186 End stage renal disease: Secondary | ICD-10-CM | POA: Diagnosis not present

## 2016-08-09 DIAGNOSIS — D631 Anemia in chronic kidney disease: Secondary | ICD-10-CM | POA: Diagnosis not present

## 2016-08-11 DIAGNOSIS — Z992 Dependence on renal dialysis: Secondary | ICD-10-CM | POA: Diagnosis not present

## 2016-08-11 DIAGNOSIS — E8770 Fluid overload, unspecified: Secondary | ICD-10-CM | POA: Diagnosis not present

## 2016-08-11 DIAGNOSIS — N2581 Secondary hyperparathyroidism of renal origin: Secondary | ICD-10-CM | POA: Diagnosis not present

## 2016-08-11 DIAGNOSIS — N186 End stage renal disease: Secondary | ICD-10-CM | POA: Diagnosis not present

## 2016-08-11 DIAGNOSIS — D509 Iron deficiency anemia, unspecified: Secondary | ICD-10-CM | POA: Diagnosis not present

## 2016-08-11 DIAGNOSIS — D631 Anemia in chronic kidney disease: Secondary | ICD-10-CM | POA: Diagnosis not present

## 2016-08-14 DIAGNOSIS — N186 End stage renal disease: Secondary | ICD-10-CM | POA: Diagnosis not present

## 2016-08-14 DIAGNOSIS — N2581 Secondary hyperparathyroidism of renal origin: Secondary | ICD-10-CM | POA: Diagnosis not present

## 2016-08-14 DIAGNOSIS — Z992 Dependence on renal dialysis: Secondary | ICD-10-CM | POA: Diagnosis not present

## 2016-08-14 DIAGNOSIS — D631 Anemia in chronic kidney disease: Secondary | ICD-10-CM | POA: Diagnosis not present

## 2016-08-14 DIAGNOSIS — E8770 Fluid overload, unspecified: Secondary | ICD-10-CM | POA: Diagnosis not present

## 2016-08-14 DIAGNOSIS — D509 Iron deficiency anemia, unspecified: Secondary | ICD-10-CM | POA: Diagnosis not present

## 2016-08-16 DIAGNOSIS — N186 End stage renal disease: Secondary | ICD-10-CM | POA: Diagnosis not present

## 2016-08-16 DIAGNOSIS — N2581 Secondary hyperparathyroidism of renal origin: Secondary | ICD-10-CM | POA: Diagnosis not present

## 2016-08-16 DIAGNOSIS — D631 Anemia in chronic kidney disease: Secondary | ICD-10-CM | POA: Diagnosis not present

## 2016-08-16 DIAGNOSIS — D509 Iron deficiency anemia, unspecified: Secondary | ICD-10-CM | POA: Diagnosis not present

## 2016-08-16 DIAGNOSIS — E8770 Fluid overload, unspecified: Secondary | ICD-10-CM | POA: Diagnosis not present

## 2016-08-16 DIAGNOSIS — Z992 Dependence on renal dialysis: Secondary | ICD-10-CM | POA: Diagnosis not present

## 2016-08-18 DIAGNOSIS — Z992 Dependence on renal dialysis: Secondary | ICD-10-CM | POA: Diagnosis not present

## 2016-08-18 DIAGNOSIS — D631 Anemia in chronic kidney disease: Secondary | ICD-10-CM | POA: Diagnosis not present

## 2016-08-18 DIAGNOSIS — D509 Iron deficiency anemia, unspecified: Secondary | ICD-10-CM | POA: Diagnosis not present

## 2016-08-18 DIAGNOSIS — E8770 Fluid overload, unspecified: Secondary | ICD-10-CM | POA: Diagnosis not present

## 2016-08-18 DIAGNOSIS — N2581 Secondary hyperparathyroidism of renal origin: Secondary | ICD-10-CM | POA: Diagnosis not present

## 2016-08-18 DIAGNOSIS — N186 End stage renal disease: Secondary | ICD-10-CM | POA: Diagnosis not present

## 2016-08-21 DIAGNOSIS — E8770 Fluid overload, unspecified: Secondary | ICD-10-CM | POA: Diagnosis not present

## 2016-08-21 DIAGNOSIS — D631 Anemia in chronic kidney disease: Secondary | ICD-10-CM | POA: Diagnosis not present

## 2016-08-21 DIAGNOSIS — Z992 Dependence on renal dialysis: Secondary | ICD-10-CM | POA: Diagnosis not present

## 2016-08-21 DIAGNOSIS — N2581 Secondary hyperparathyroidism of renal origin: Secondary | ICD-10-CM | POA: Diagnosis not present

## 2016-08-21 DIAGNOSIS — D509 Iron deficiency anemia, unspecified: Secondary | ICD-10-CM | POA: Diagnosis not present

## 2016-08-21 DIAGNOSIS — N186 End stage renal disease: Secondary | ICD-10-CM | POA: Diagnosis not present

## 2016-08-22 DIAGNOSIS — D509 Iron deficiency anemia, unspecified: Secondary | ICD-10-CM | POA: Diagnosis not present

## 2016-08-22 DIAGNOSIS — E8770 Fluid overload, unspecified: Secondary | ICD-10-CM | POA: Diagnosis not present

## 2016-08-22 DIAGNOSIS — N2581 Secondary hyperparathyroidism of renal origin: Secondary | ICD-10-CM | POA: Diagnosis not present

## 2016-08-22 DIAGNOSIS — D631 Anemia in chronic kidney disease: Secondary | ICD-10-CM | POA: Diagnosis not present

## 2016-08-22 DIAGNOSIS — N186 End stage renal disease: Secondary | ICD-10-CM | POA: Diagnosis not present

## 2016-08-22 DIAGNOSIS — Z992 Dependence on renal dialysis: Secondary | ICD-10-CM | POA: Diagnosis not present

## 2016-08-23 DIAGNOSIS — D509 Iron deficiency anemia, unspecified: Secondary | ICD-10-CM | POA: Diagnosis not present

## 2016-08-23 DIAGNOSIS — N186 End stage renal disease: Secondary | ICD-10-CM | POA: Diagnosis not present

## 2016-08-23 DIAGNOSIS — E8770 Fluid overload, unspecified: Secondary | ICD-10-CM | POA: Diagnosis not present

## 2016-08-23 DIAGNOSIS — N2581 Secondary hyperparathyroidism of renal origin: Secondary | ICD-10-CM | POA: Diagnosis not present

## 2016-08-23 DIAGNOSIS — Z992 Dependence on renal dialysis: Secondary | ICD-10-CM | POA: Diagnosis not present

## 2016-08-23 DIAGNOSIS — D631 Anemia in chronic kidney disease: Secondary | ICD-10-CM | POA: Diagnosis not present

## 2016-08-25 DIAGNOSIS — D509 Iron deficiency anemia, unspecified: Secondary | ICD-10-CM | POA: Diagnosis not present

## 2016-08-25 DIAGNOSIS — N186 End stage renal disease: Secondary | ICD-10-CM | POA: Diagnosis not present

## 2016-08-25 DIAGNOSIS — E8770 Fluid overload, unspecified: Secondary | ICD-10-CM | POA: Diagnosis not present

## 2016-08-25 DIAGNOSIS — N2581 Secondary hyperparathyroidism of renal origin: Secondary | ICD-10-CM | POA: Diagnosis not present

## 2016-08-25 DIAGNOSIS — Z992 Dependence on renal dialysis: Secondary | ICD-10-CM | POA: Diagnosis not present

## 2016-08-25 DIAGNOSIS — D631 Anemia in chronic kidney disease: Secondary | ICD-10-CM | POA: Diagnosis not present

## 2016-08-28 DIAGNOSIS — Z992 Dependence on renal dialysis: Secondary | ICD-10-CM | POA: Diagnosis not present

## 2016-08-28 DIAGNOSIS — D509 Iron deficiency anemia, unspecified: Secondary | ICD-10-CM | POA: Diagnosis not present

## 2016-08-28 DIAGNOSIS — E8770 Fluid overload, unspecified: Secondary | ICD-10-CM | POA: Diagnosis not present

## 2016-08-28 DIAGNOSIS — N2581 Secondary hyperparathyroidism of renal origin: Secondary | ICD-10-CM | POA: Diagnosis not present

## 2016-08-28 DIAGNOSIS — D631 Anemia in chronic kidney disease: Secondary | ICD-10-CM | POA: Diagnosis not present

## 2016-08-28 DIAGNOSIS — N186 End stage renal disease: Secondary | ICD-10-CM | POA: Diagnosis not present

## 2016-08-30 DIAGNOSIS — D631 Anemia in chronic kidney disease: Secondary | ICD-10-CM | POA: Diagnosis not present

## 2016-08-30 DIAGNOSIS — N186 End stage renal disease: Secondary | ICD-10-CM | POA: Diagnosis not present

## 2016-08-30 DIAGNOSIS — N2581 Secondary hyperparathyroidism of renal origin: Secondary | ICD-10-CM | POA: Diagnosis not present

## 2016-08-30 DIAGNOSIS — D509 Iron deficiency anemia, unspecified: Secondary | ICD-10-CM | POA: Diagnosis not present

## 2016-08-30 DIAGNOSIS — E8770 Fluid overload, unspecified: Secondary | ICD-10-CM | POA: Diagnosis not present

## 2016-08-30 DIAGNOSIS — Z992 Dependence on renal dialysis: Secondary | ICD-10-CM | POA: Diagnosis not present

## 2016-09-01 DIAGNOSIS — D631 Anemia in chronic kidney disease: Secondary | ICD-10-CM | POA: Diagnosis not present

## 2016-09-01 DIAGNOSIS — N2581 Secondary hyperparathyroidism of renal origin: Secondary | ICD-10-CM | POA: Diagnosis not present

## 2016-09-01 DIAGNOSIS — D509 Iron deficiency anemia, unspecified: Secondary | ICD-10-CM | POA: Diagnosis not present

## 2016-09-01 DIAGNOSIS — N186 End stage renal disease: Secondary | ICD-10-CM | POA: Diagnosis not present

## 2016-09-01 DIAGNOSIS — Z992 Dependence on renal dialysis: Secondary | ICD-10-CM | POA: Diagnosis not present

## 2016-09-01 DIAGNOSIS — E8770 Fluid overload, unspecified: Secondary | ICD-10-CM | POA: Diagnosis not present

## 2016-09-04 DIAGNOSIS — Z992 Dependence on renal dialysis: Secondary | ICD-10-CM | POA: Diagnosis not present

## 2016-09-04 DIAGNOSIS — D509 Iron deficiency anemia, unspecified: Secondary | ICD-10-CM | POA: Diagnosis not present

## 2016-09-04 DIAGNOSIS — N186 End stage renal disease: Secondary | ICD-10-CM | POA: Diagnosis not present

## 2016-09-04 DIAGNOSIS — E8770 Fluid overload, unspecified: Secondary | ICD-10-CM | POA: Diagnosis not present

## 2016-09-04 DIAGNOSIS — N2581 Secondary hyperparathyroidism of renal origin: Secondary | ICD-10-CM | POA: Diagnosis not present

## 2016-09-04 DIAGNOSIS — D631 Anemia in chronic kidney disease: Secondary | ICD-10-CM | POA: Diagnosis not present

## 2016-09-05 DIAGNOSIS — E8770 Fluid overload, unspecified: Secondary | ICD-10-CM | POA: Diagnosis not present

## 2016-09-05 DIAGNOSIS — N2581 Secondary hyperparathyroidism of renal origin: Secondary | ICD-10-CM | POA: Diagnosis not present

## 2016-09-05 DIAGNOSIS — D631 Anemia in chronic kidney disease: Secondary | ICD-10-CM | POA: Diagnosis not present

## 2016-09-05 DIAGNOSIS — Z992 Dependence on renal dialysis: Secondary | ICD-10-CM | POA: Diagnosis not present

## 2016-09-05 DIAGNOSIS — D509 Iron deficiency anemia, unspecified: Secondary | ICD-10-CM | POA: Diagnosis not present

## 2016-09-05 DIAGNOSIS — N186 End stage renal disease: Secondary | ICD-10-CM | POA: Diagnosis not present

## 2016-09-06 DIAGNOSIS — N186 End stage renal disease: Secondary | ICD-10-CM | POA: Diagnosis not present

## 2016-09-06 DIAGNOSIS — E8770 Fluid overload, unspecified: Secondary | ICD-10-CM | POA: Diagnosis not present

## 2016-09-06 DIAGNOSIS — D509 Iron deficiency anemia, unspecified: Secondary | ICD-10-CM | POA: Diagnosis not present

## 2016-09-06 DIAGNOSIS — Z992 Dependence on renal dialysis: Secondary | ICD-10-CM | POA: Diagnosis not present

## 2016-09-06 DIAGNOSIS — N2581 Secondary hyperparathyroidism of renal origin: Secondary | ICD-10-CM | POA: Diagnosis not present

## 2016-09-06 DIAGNOSIS — D631 Anemia in chronic kidney disease: Secondary | ICD-10-CM | POA: Diagnosis not present

## 2016-09-08 DIAGNOSIS — N2581 Secondary hyperparathyroidism of renal origin: Secondary | ICD-10-CM | POA: Diagnosis not present

## 2016-09-08 DIAGNOSIS — D631 Anemia in chronic kidney disease: Secondary | ICD-10-CM | POA: Diagnosis not present

## 2016-09-08 DIAGNOSIS — N186 End stage renal disease: Secondary | ICD-10-CM | POA: Diagnosis not present

## 2016-09-08 DIAGNOSIS — D509 Iron deficiency anemia, unspecified: Secondary | ICD-10-CM | POA: Diagnosis not present

## 2016-09-08 DIAGNOSIS — Z992 Dependence on renal dialysis: Secondary | ICD-10-CM | POA: Diagnosis not present

## 2016-09-11 DIAGNOSIS — N2581 Secondary hyperparathyroidism of renal origin: Secondary | ICD-10-CM | POA: Diagnosis not present

## 2016-09-11 DIAGNOSIS — D509 Iron deficiency anemia, unspecified: Secondary | ICD-10-CM | POA: Diagnosis not present

## 2016-09-11 DIAGNOSIS — N186 End stage renal disease: Secondary | ICD-10-CM | POA: Diagnosis not present

## 2016-09-11 DIAGNOSIS — D631 Anemia in chronic kidney disease: Secondary | ICD-10-CM | POA: Diagnosis not present

## 2016-09-11 DIAGNOSIS — Z992 Dependence on renal dialysis: Secondary | ICD-10-CM | POA: Diagnosis not present

## 2016-09-13 DIAGNOSIS — N2581 Secondary hyperparathyroidism of renal origin: Secondary | ICD-10-CM | POA: Diagnosis not present

## 2016-09-13 DIAGNOSIS — D509 Iron deficiency anemia, unspecified: Secondary | ICD-10-CM | POA: Diagnosis not present

## 2016-09-13 DIAGNOSIS — N186 End stage renal disease: Secondary | ICD-10-CM | POA: Diagnosis not present

## 2016-09-13 DIAGNOSIS — D631 Anemia in chronic kidney disease: Secondary | ICD-10-CM | POA: Diagnosis not present

## 2016-09-13 DIAGNOSIS — Z992 Dependence on renal dialysis: Secondary | ICD-10-CM | POA: Diagnosis not present

## 2016-09-15 DIAGNOSIS — N2581 Secondary hyperparathyroidism of renal origin: Secondary | ICD-10-CM | POA: Diagnosis not present

## 2016-09-15 DIAGNOSIS — N186 End stage renal disease: Secondary | ICD-10-CM | POA: Diagnosis not present

## 2016-09-15 DIAGNOSIS — D509 Iron deficiency anemia, unspecified: Secondary | ICD-10-CM | POA: Diagnosis not present

## 2016-09-15 DIAGNOSIS — Z992 Dependence on renal dialysis: Secondary | ICD-10-CM | POA: Diagnosis not present

## 2016-09-15 DIAGNOSIS — D631 Anemia in chronic kidney disease: Secondary | ICD-10-CM | POA: Diagnosis not present

## 2016-09-18 DIAGNOSIS — N186 End stage renal disease: Secondary | ICD-10-CM | POA: Diagnosis not present

## 2016-09-18 DIAGNOSIS — N2581 Secondary hyperparathyroidism of renal origin: Secondary | ICD-10-CM | POA: Diagnosis not present

## 2016-09-18 DIAGNOSIS — D631 Anemia in chronic kidney disease: Secondary | ICD-10-CM | POA: Diagnosis not present

## 2016-09-18 DIAGNOSIS — D509 Iron deficiency anemia, unspecified: Secondary | ICD-10-CM | POA: Diagnosis not present

## 2016-09-18 DIAGNOSIS — Z992 Dependence on renal dialysis: Secondary | ICD-10-CM | POA: Diagnosis not present

## 2016-09-20 DIAGNOSIS — N2581 Secondary hyperparathyroidism of renal origin: Secondary | ICD-10-CM | POA: Diagnosis not present

## 2016-09-20 DIAGNOSIS — N186 End stage renal disease: Secondary | ICD-10-CM | POA: Diagnosis not present

## 2016-09-20 DIAGNOSIS — D509 Iron deficiency anemia, unspecified: Secondary | ICD-10-CM | POA: Diagnosis not present

## 2016-09-20 DIAGNOSIS — Z992 Dependence on renal dialysis: Secondary | ICD-10-CM | POA: Diagnosis not present

## 2016-09-20 DIAGNOSIS — D631 Anemia in chronic kidney disease: Secondary | ICD-10-CM | POA: Diagnosis not present

## 2016-09-21 DIAGNOSIS — E1140 Type 2 diabetes mellitus with diabetic neuropathy, unspecified: Secondary | ICD-10-CM | POA: Diagnosis not present

## 2016-09-21 DIAGNOSIS — B351 Tinea unguium: Secondary | ICD-10-CM | POA: Diagnosis not present

## 2016-09-21 DIAGNOSIS — Z794 Long term (current) use of insulin: Secondary | ICD-10-CM | POA: Diagnosis not present

## 2016-09-22 DIAGNOSIS — D509 Iron deficiency anemia, unspecified: Secondary | ICD-10-CM | POA: Diagnosis not present

## 2016-09-22 DIAGNOSIS — Z992 Dependence on renal dialysis: Secondary | ICD-10-CM | POA: Diagnosis not present

## 2016-09-22 DIAGNOSIS — N2581 Secondary hyperparathyroidism of renal origin: Secondary | ICD-10-CM | POA: Diagnosis not present

## 2016-09-22 DIAGNOSIS — D631 Anemia in chronic kidney disease: Secondary | ICD-10-CM | POA: Diagnosis not present

## 2016-09-22 DIAGNOSIS — N186 End stage renal disease: Secondary | ICD-10-CM | POA: Diagnosis not present

## 2016-09-25 DIAGNOSIS — D509 Iron deficiency anemia, unspecified: Secondary | ICD-10-CM | POA: Diagnosis not present

## 2016-09-25 DIAGNOSIS — N186 End stage renal disease: Secondary | ICD-10-CM | POA: Diagnosis not present

## 2016-09-25 DIAGNOSIS — Z992 Dependence on renal dialysis: Secondary | ICD-10-CM | POA: Diagnosis not present

## 2016-09-25 DIAGNOSIS — N2581 Secondary hyperparathyroidism of renal origin: Secondary | ICD-10-CM | POA: Diagnosis not present

## 2016-09-25 DIAGNOSIS — D631 Anemia in chronic kidney disease: Secondary | ICD-10-CM | POA: Diagnosis not present

## 2016-09-27 DIAGNOSIS — N186 End stage renal disease: Secondary | ICD-10-CM | POA: Diagnosis not present

## 2016-09-27 DIAGNOSIS — D631 Anemia in chronic kidney disease: Secondary | ICD-10-CM | POA: Diagnosis not present

## 2016-09-27 DIAGNOSIS — D509 Iron deficiency anemia, unspecified: Secondary | ICD-10-CM | POA: Diagnosis not present

## 2016-09-27 DIAGNOSIS — Z992 Dependence on renal dialysis: Secondary | ICD-10-CM | POA: Diagnosis not present

## 2016-09-27 DIAGNOSIS — N2581 Secondary hyperparathyroidism of renal origin: Secondary | ICD-10-CM | POA: Diagnosis not present

## 2016-09-29 DIAGNOSIS — Z992 Dependence on renal dialysis: Secondary | ICD-10-CM | POA: Diagnosis not present

## 2016-09-29 DIAGNOSIS — N186 End stage renal disease: Secondary | ICD-10-CM | POA: Diagnosis not present

## 2016-09-29 DIAGNOSIS — D509 Iron deficiency anemia, unspecified: Secondary | ICD-10-CM | POA: Diagnosis not present

## 2016-09-29 DIAGNOSIS — N2581 Secondary hyperparathyroidism of renal origin: Secondary | ICD-10-CM | POA: Diagnosis not present

## 2016-09-29 DIAGNOSIS — D631 Anemia in chronic kidney disease: Secondary | ICD-10-CM | POA: Diagnosis not present

## 2016-10-02 DIAGNOSIS — D631 Anemia in chronic kidney disease: Secondary | ICD-10-CM | POA: Diagnosis not present

## 2016-10-02 DIAGNOSIS — D509 Iron deficiency anemia, unspecified: Secondary | ICD-10-CM | POA: Diagnosis not present

## 2016-10-02 DIAGNOSIS — N186 End stage renal disease: Secondary | ICD-10-CM | POA: Diagnosis not present

## 2016-10-02 DIAGNOSIS — Z992 Dependence on renal dialysis: Secondary | ICD-10-CM | POA: Diagnosis not present

## 2016-10-02 DIAGNOSIS — N2581 Secondary hyperparathyroidism of renal origin: Secondary | ICD-10-CM | POA: Diagnosis not present

## 2016-10-04 DIAGNOSIS — D509 Iron deficiency anemia, unspecified: Secondary | ICD-10-CM | POA: Diagnosis not present

## 2016-10-04 DIAGNOSIS — D631 Anemia in chronic kidney disease: Secondary | ICD-10-CM | POA: Diagnosis not present

## 2016-10-04 DIAGNOSIS — N186 End stage renal disease: Secondary | ICD-10-CM | POA: Diagnosis not present

## 2016-10-04 DIAGNOSIS — N2581 Secondary hyperparathyroidism of renal origin: Secondary | ICD-10-CM | POA: Diagnosis not present

## 2016-10-04 DIAGNOSIS — Z992 Dependence on renal dialysis: Secondary | ICD-10-CM | POA: Diagnosis not present

## 2016-10-06 DIAGNOSIS — N186 End stage renal disease: Secondary | ICD-10-CM | POA: Diagnosis not present

## 2016-10-06 DIAGNOSIS — D509 Iron deficiency anemia, unspecified: Secondary | ICD-10-CM | POA: Diagnosis not present

## 2016-10-06 DIAGNOSIS — N2581 Secondary hyperparathyroidism of renal origin: Secondary | ICD-10-CM | POA: Diagnosis not present

## 2016-10-06 DIAGNOSIS — Z992 Dependence on renal dialysis: Secondary | ICD-10-CM | POA: Diagnosis not present

## 2016-10-06 DIAGNOSIS — D631 Anemia in chronic kidney disease: Secondary | ICD-10-CM | POA: Diagnosis not present

## 2016-10-07 DIAGNOSIS — Z992 Dependence on renal dialysis: Secondary | ICD-10-CM | POA: Diagnosis not present

## 2016-10-07 DIAGNOSIS — N186 End stage renal disease: Secondary | ICD-10-CM | POA: Diagnosis not present

## 2016-10-09 DIAGNOSIS — N186 End stage renal disease: Secondary | ICD-10-CM | POA: Diagnosis not present

## 2016-10-09 DIAGNOSIS — Z992 Dependence on renal dialysis: Secondary | ICD-10-CM | POA: Diagnosis not present

## 2016-10-09 DIAGNOSIS — D631 Anemia in chronic kidney disease: Secondary | ICD-10-CM | POA: Diagnosis not present

## 2016-10-09 DIAGNOSIS — D509 Iron deficiency anemia, unspecified: Secondary | ICD-10-CM | POA: Diagnosis not present

## 2016-10-09 DIAGNOSIS — N2581 Secondary hyperparathyroidism of renal origin: Secondary | ICD-10-CM | POA: Diagnosis not present

## 2016-10-11 DIAGNOSIS — N2581 Secondary hyperparathyroidism of renal origin: Secondary | ICD-10-CM | POA: Diagnosis not present

## 2016-10-11 DIAGNOSIS — D509 Iron deficiency anemia, unspecified: Secondary | ICD-10-CM | POA: Diagnosis not present

## 2016-10-11 DIAGNOSIS — D631 Anemia in chronic kidney disease: Secondary | ICD-10-CM | POA: Diagnosis not present

## 2016-10-11 DIAGNOSIS — N186 End stage renal disease: Secondary | ICD-10-CM | POA: Diagnosis not present

## 2016-10-11 DIAGNOSIS — Z992 Dependence on renal dialysis: Secondary | ICD-10-CM | POA: Diagnosis not present

## 2016-10-13 DIAGNOSIS — N2581 Secondary hyperparathyroidism of renal origin: Secondary | ICD-10-CM | POA: Diagnosis not present

## 2016-10-13 DIAGNOSIS — Z992 Dependence on renal dialysis: Secondary | ICD-10-CM | POA: Diagnosis not present

## 2016-10-13 DIAGNOSIS — N186 End stage renal disease: Secondary | ICD-10-CM | POA: Diagnosis not present

## 2016-10-13 DIAGNOSIS — D509 Iron deficiency anemia, unspecified: Secondary | ICD-10-CM | POA: Diagnosis not present

## 2016-10-13 DIAGNOSIS — D631 Anemia in chronic kidney disease: Secondary | ICD-10-CM | POA: Diagnosis not present

## 2016-10-16 DIAGNOSIS — N2581 Secondary hyperparathyroidism of renal origin: Secondary | ICD-10-CM | POA: Diagnosis not present

## 2016-10-16 DIAGNOSIS — N186 End stage renal disease: Secondary | ICD-10-CM | POA: Diagnosis not present

## 2016-10-16 DIAGNOSIS — D631 Anemia in chronic kidney disease: Secondary | ICD-10-CM | POA: Diagnosis not present

## 2016-10-16 DIAGNOSIS — D509 Iron deficiency anemia, unspecified: Secondary | ICD-10-CM | POA: Diagnosis not present

## 2016-10-16 DIAGNOSIS — Z992 Dependence on renal dialysis: Secondary | ICD-10-CM | POA: Diagnosis not present

## 2016-10-16 DIAGNOSIS — E119 Type 2 diabetes mellitus without complications: Secondary | ICD-10-CM | POA: Diagnosis not present

## 2016-10-18 DIAGNOSIS — D509 Iron deficiency anemia, unspecified: Secondary | ICD-10-CM | POA: Diagnosis not present

## 2016-10-18 DIAGNOSIS — Z992 Dependence on renal dialysis: Secondary | ICD-10-CM | POA: Diagnosis not present

## 2016-10-18 DIAGNOSIS — N2581 Secondary hyperparathyroidism of renal origin: Secondary | ICD-10-CM | POA: Diagnosis not present

## 2016-10-18 DIAGNOSIS — N186 End stage renal disease: Secondary | ICD-10-CM | POA: Diagnosis not present

## 2016-10-18 DIAGNOSIS — D631 Anemia in chronic kidney disease: Secondary | ICD-10-CM | POA: Diagnosis not present

## 2016-10-20 DIAGNOSIS — D509 Iron deficiency anemia, unspecified: Secondary | ICD-10-CM | POA: Diagnosis not present

## 2016-10-20 DIAGNOSIS — Z992 Dependence on renal dialysis: Secondary | ICD-10-CM | POA: Diagnosis not present

## 2016-10-20 DIAGNOSIS — D631 Anemia in chronic kidney disease: Secondary | ICD-10-CM | POA: Diagnosis not present

## 2016-10-20 DIAGNOSIS — N2581 Secondary hyperparathyroidism of renal origin: Secondary | ICD-10-CM | POA: Diagnosis not present

## 2016-10-20 DIAGNOSIS — N186 End stage renal disease: Secondary | ICD-10-CM | POA: Diagnosis not present

## 2016-10-23 DIAGNOSIS — N2581 Secondary hyperparathyroidism of renal origin: Secondary | ICD-10-CM | POA: Diagnosis not present

## 2016-10-23 DIAGNOSIS — D509 Iron deficiency anemia, unspecified: Secondary | ICD-10-CM | POA: Diagnosis not present

## 2016-10-23 DIAGNOSIS — N186 End stage renal disease: Secondary | ICD-10-CM | POA: Diagnosis not present

## 2016-10-23 DIAGNOSIS — Z992 Dependence on renal dialysis: Secondary | ICD-10-CM | POA: Diagnosis not present

## 2016-10-23 DIAGNOSIS — D631 Anemia in chronic kidney disease: Secondary | ICD-10-CM | POA: Diagnosis not present

## 2016-10-24 DIAGNOSIS — E119 Type 2 diabetes mellitus without complications: Secondary | ICD-10-CM | POA: Diagnosis not present

## 2016-10-24 DIAGNOSIS — E669 Obesity, unspecified: Secondary | ICD-10-CM | POA: Diagnosis not present

## 2016-10-24 DIAGNOSIS — F17200 Nicotine dependence, unspecified, uncomplicated: Secondary | ICD-10-CM | POA: Diagnosis not present

## 2016-10-24 DIAGNOSIS — I5032 Chronic diastolic (congestive) heart failure: Secondary | ICD-10-CM | POA: Diagnosis not present

## 2016-10-24 DIAGNOSIS — I429 Cardiomyopathy, unspecified: Secondary | ICD-10-CM | POA: Diagnosis not present

## 2016-10-24 DIAGNOSIS — C649 Malignant neoplasm of unspecified kidney, except renal pelvis: Secondary | ICD-10-CM | POA: Diagnosis not present

## 2016-10-24 DIAGNOSIS — I208 Other forms of angina pectoris: Secondary | ICD-10-CM | POA: Diagnosis not present

## 2016-10-24 DIAGNOSIS — I509 Heart failure, unspecified: Secondary | ICD-10-CM | POA: Diagnosis not present

## 2016-10-24 DIAGNOSIS — R609 Edema, unspecified: Secondary | ICD-10-CM | POA: Diagnosis not present

## 2016-10-24 DIAGNOSIS — R0602 Shortness of breath: Secondary | ICD-10-CM | POA: Diagnosis not present

## 2016-10-24 DIAGNOSIS — I10 Essential (primary) hypertension: Secondary | ICD-10-CM | POA: Diagnosis not present

## 2016-10-24 DIAGNOSIS — N186 End stage renal disease: Secondary | ICD-10-CM | POA: Diagnosis not present

## 2016-10-25 DIAGNOSIS — D509 Iron deficiency anemia, unspecified: Secondary | ICD-10-CM | POA: Diagnosis not present

## 2016-10-25 DIAGNOSIS — N186 End stage renal disease: Secondary | ICD-10-CM | POA: Diagnosis not present

## 2016-10-25 DIAGNOSIS — N2581 Secondary hyperparathyroidism of renal origin: Secondary | ICD-10-CM | POA: Diagnosis not present

## 2016-10-25 DIAGNOSIS — Z992 Dependence on renal dialysis: Secondary | ICD-10-CM | POA: Diagnosis not present

## 2016-10-25 DIAGNOSIS — D631 Anemia in chronic kidney disease: Secondary | ICD-10-CM | POA: Diagnosis not present

## 2016-10-27 DIAGNOSIS — N186 End stage renal disease: Secondary | ICD-10-CM | POA: Diagnosis not present

## 2016-10-27 DIAGNOSIS — D631 Anemia in chronic kidney disease: Secondary | ICD-10-CM | POA: Diagnosis not present

## 2016-10-27 DIAGNOSIS — D509 Iron deficiency anemia, unspecified: Secondary | ICD-10-CM | POA: Diagnosis not present

## 2016-10-27 DIAGNOSIS — Z992 Dependence on renal dialysis: Secondary | ICD-10-CM | POA: Diagnosis not present

## 2016-10-27 DIAGNOSIS — N2581 Secondary hyperparathyroidism of renal origin: Secondary | ICD-10-CM | POA: Diagnosis not present

## 2016-10-30 DIAGNOSIS — D509 Iron deficiency anemia, unspecified: Secondary | ICD-10-CM | POA: Diagnosis not present

## 2016-10-30 DIAGNOSIS — D631 Anemia in chronic kidney disease: Secondary | ICD-10-CM | POA: Diagnosis not present

## 2016-10-30 DIAGNOSIS — N2581 Secondary hyperparathyroidism of renal origin: Secondary | ICD-10-CM | POA: Diagnosis not present

## 2016-10-30 DIAGNOSIS — N186 End stage renal disease: Secondary | ICD-10-CM | POA: Diagnosis not present

## 2016-10-30 DIAGNOSIS — Z992 Dependence on renal dialysis: Secondary | ICD-10-CM | POA: Diagnosis not present

## 2016-11-01 DIAGNOSIS — N2581 Secondary hyperparathyroidism of renal origin: Secondary | ICD-10-CM | POA: Diagnosis not present

## 2016-11-01 DIAGNOSIS — Z992 Dependence on renal dialysis: Secondary | ICD-10-CM | POA: Diagnosis not present

## 2016-11-01 DIAGNOSIS — N186 End stage renal disease: Secondary | ICD-10-CM | POA: Diagnosis not present

## 2016-11-01 DIAGNOSIS — D631 Anemia in chronic kidney disease: Secondary | ICD-10-CM | POA: Diagnosis not present

## 2016-11-01 DIAGNOSIS — D509 Iron deficiency anemia, unspecified: Secondary | ICD-10-CM | POA: Diagnosis not present

## 2016-11-03 DIAGNOSIS — N186 End stage renal disease: Secondary | ICD-10-CM | POA: Diagnosis not present

## 2016-11-03 DIAGNOSIS — Z992 Dependence on renal dialysis: Secondary | ICD-10-CM | POA: Diagnosis not present

## 2016-11-03 DIAGNOSIS — D631 Anemia in chronic kidney disease: Secondary | ICD-10-CM | POA: Diagnosis not present

## 2016-11-03 DIAGNOSIS — N2581 Secondary hyperparathyroidism of renal origin: Secondary | ICD-10-CM | POA: Diagnosis not present

## 2016-11-03 DIAGNOSIS — D509 Iron deficiency anemia, unspecified: Secondary | ICD-10-CM | POA: Diagnosis not present

## 2016-11-06 DIAGNOSIS — N186 End stage renal disease: Secondary | ICD-10-CM | POA: Diagnosis not present

## 2016-11-06 DIAGNOSIS — D631 Anemia in chronic kidney disease: Secondary | ICD-10-CM | POA: Diagnosis not present

## 2016-11-06 DIAGNOSIS — D509 Iron deficiency anemia, unspecified: Secondary | ICD-10-CM | POA: Diagnosis not present

## 2016-11-06 DIAGNOSIS — Z992 Dependence on renal dialysis: Secondary | ICD-10-CM | POA: Diagnosis not present

## 2016-11-06 DIAGNOSIS — N2581 Secondary hyperparathyroidism of renal origin: Secondary | ICD-10-CM | POA: Diagnosis not present

## 2016-11-08 DIAGNOSIS — Z992 Dependence on renal dialysis: Secondary | ICD-10-CM | POA: Diagnosis not present

## 2016-11-08 DIAGNOSIS — N2581 Secondary hyperparathyroidism of renal origin: Secondary | ICD-10-CM | POA: Diagnosis not present

## 2016-11-08 DIAGNOSIS — D631 Anemia in chronic kidney disease: Secondary | ICD-10-CM | POA: Diagnosis not present

## 2016-11-08 DIAGNOSIS — D509 Iron deficiency anemia, unspecified: Secondary | ICD-10-CM | POA: Diagnosis not present

## 2016-11-08 DIAGNOSIS — N186 End stage renal disease: Secondary | ICD-10-CM | POA: Diagnosis not present

## 2016-11-10 DIAGNOSIS — D631 Anemia in chronic kidney disease: Secondary | ICD-10-CM | POA: Diagnosis not present

## 2016-11-10 DIAGNOSIS — N2581 Secondary hyperparathyroidism of renal origin: Secondary | ICD-10-CM | POA: Diagnosis not present

## 2016-11-10 DIAGNOSIS — N186 End stage renal disease: Secondary | ICD-10-CM | POA: Diagnosis not present

## 2016-11-10 DIAGNOSIS — D509 Iron deficiency anemia, unspecified: Secondary | ICD-10-CM | POA: Diagnosis not present

## 2016-11-10 DIAGNOSIS — Z992 Dependence on renal dialysis: Secondary | ICD-10-CM | POA: Diagnosis not present

## 2016-11-13 DIAGNOSIS — N186 End stage renal disease: Secondary | ICD-10-CM | POA: Diagnosis not present

## 2016-11-13 DIAGNOSIS — D509 Iron deficiency anemia, unspecified: Secondary | ICD-10-CM | POA: Diagnosis not present

## 2016-11-13 DIAGNOSIS — D631 Anemia in chronic kidney disease: Secondary | ICD-10-CM | POA: Diagnosis not present

## 2016-11-13 DIAGNOSIS — Z992 Dependence on renal dialysis: Secondary | ICD-10-CM | POA: Diagnosis not present

## 2016-11-13 DIAGNOSIS — N2581 Secondary hyperparathyroidism of renal origin: Secondary | ICD-10-CM | POA: Diagnosis not present

## 2016-11-15 DIAGNOSIS — D631 Anemia in chronic kidney disease: Secondary | ICD-10-CM | POA: Diagnosis not present

## 2016-11-15 DIAGNOSIS — D509 Iron deficiency anemia, unspecified: Secondary | ICD-10-CM | POA: Diagnosis not present

## 2016-11-15 DIAGNOSIS — Z992 Dependence on renal dialysis: Secondary | ICD-10-CM | POA: Diagnosis not present

## 2016-11-15 DIAGNOSIS — N2581 Secondary hyperparathyroidism of renal origin: Secondary | ICD-10-CM | POA: Diagnosis not present

## 2016-11-15 DIAGNOSIS — N186 End stage renal disease: Secondary | ICD-10-CM | POA: Diagnosis not present

## 2016-11-17 DIAGNOSIS — Z992 Dependence on renal dialysis: Secondary | ICD-10-CM | POA: Diagnosis not present

## 2016-11-17 DIAGNOSIS — D509 Iron deficiency anemia, unspecified: Secondary | ICD-10-CM | POA: Diagnosis not present

## 2016-11-17 DIAGNOSIS — N2581 Secondary hyperparathyroidism of renal origin: Secondary | ICD-10-CM | POA: Diagnosis not present

## 2016-11-17 DIAGNOSIS — D631 Anemia in chronic kidney disease: Secondary | ICD-10-CM | POA: Diagnosis not present

## 2016-11-17 DIAGNOSIS — N186 End stage renal disease: Secondary | ICD-10-CM | POA: Diagnosis not present

## 2016-11-20 DIAGNOSIS — D631 Anemia in chronic kidney disease: Secondary | ICD-10-CM | POA: Diagnosis not present

## 2016-11-20 DIAGNOSIS — Z992 Dependence on renal dialysis: Secondary | ICD-10-CM | POA: Diagnosis not present

## 2016-11-20 DIAGNOSIS — N2581 Secondary hyperparathyroidism of renal origin: Secondary | ICD-10-CM | POA: Diagnosis not present

## 2016-11-20 DIAGNOSIS — N186 End stage renal disease: Secondary | ICD-10-CM | POA: Diagnosis not present

## 2016-11-20 DIAGNOSIS — D509 Iron deficiency anemia, unspecified: Secondary | ICD-10-CM | POA: Diagnosis not present

## 2016-11-22 DIAGNOSIS — D509 Iron deficiency anemia, unspecified: Secondary | ICD-10-CM | POA: Diagnosis not present

## 2016-11-22 DIAGNOSIS — D631 Anemia in chronic kidney disease: Secondary | ICD-10-CM | POA: Diagnosis not present

## 2016-11-22 DIAGNOSIS — Z992 Dependence on renal dialysis: Secondary | ICD-10-CM | POA: Diagnosis not present

## 2016-11-22 DIAGNOSIS — N186 End stage renal disease: Secondary | ICD-10-CM | POA: Diagnosis not present

## 2016-11-22 DIAGNOSIS — N2581 Secondary hyperparathyroidism of renal origin: Secondary | ICD-10-CM | POA: Diagnosis not present

## 2016-11-23 DIAGNOSIS — I509 Heart failure, unspecified: Secondary | ICD-10-CM | POA: Diagnosis not present

## 2016-11-24 DIAGNOSIS — N186 End stage renal disease: Secondary | ICD-10-CM | POA: Diagnosis not present

## 2016-11-24 DIAGNOSIS — Z992 Dependence on renal dialysis: Secondary | ICD-10-CM | POA: Diagnosis not present

## 2016-11-24 DIAGNOSIS — D631 Anemia in chronic kidney disease: Secondary | ICD-10-CM | POA: Diagnosis not present

## 2016-11-24 DIAGNOSIS — D509 Iron deficiency anemia, unspecified: Secondary | ICD-10-CM | POA: Diagnosis not present

## 2016-11-24 DIAGNOSIS — N2581 Secondary hyperparathyroidism of renal origin: Secondary | ICD-10-CM | POA: Diagnosis not present

## 2016-11-27 DIAGNOSIS — D631 Anemia in chronic kidney disease: Secondary | ICD-10-CM | POA: Diagnosis not present

## 2016-11-27 DIAGNOSIS — D509 Iron deficiency anemia, unspecified: Secondary | ICD-10-CM | POA: Diagnosis not present

## 2016-11-27 DIAGNOSIS — N186 End stage renal disease: Secondary | ICD-10-CM | POA: Diagnosis not present

## 2016-11-27 DIAGNOSIS — Z992 Dependence on renal dialysis: Secondary | ICD-10-CM | POA: Diagnosis not present

## 2016-11-27 DIAGNOSIS — N2581 Secondary hyperparathyroidism of renal origin: Secondary | ICD-10-CM | POA: Diagnosis not present

## 2016-11-28 DIAGNOSIS — I429 Cardiomyopathy, unspecified: Secondary | ICD-10-CM | POA: Diagnosis not present

## 2016-11-28 DIAGNOSIS — Z794 Long term (current) use of insulin: Secondary | ICD-10-CM | POA: Diagnosis not present

## 2016-11-28 DIAGNOSIS — N186 End stage renal disease: Secondary | ICD-10-CM | POA: Diagnosis not present

## 2016-11-28 DIAGNOSIS — E119 Type 2 diabetes mellitus without complications: Secondary | ICD-10-CM | POA: Diagnosis not present

## 2016-11-28 DIAGNOSIS — C649 Malignant neoplasm of unspecified kidney, except renal pelvis: Secondary | ICD-10-CM | POA: Diagnosis not present

## 2016-11-28 DIAGNOSIS — R0602 Shortness of breath: Secondary | ICD-10-CM | POA: Diagnosis not present

## 2016-11-28 DIAGNOSIS — E669 Obesity, unspecified: Secondary | ICD-10-CM | POA: Diagnosis not present

## 2016-11-28 DIAGNOSIS — I5032 Chronic diastolic (congestive) heart failure: Secondary | ICD-10-CM | POA: Diagnosis not present

## 2016-11-28 DIAGNOSIS — F17200 Nicotine dependence, unspecified, uncomplicated: Secondary | ICD-10-CM | POA: Diagnosis not present

## 2016-11-28 DIAGNOSIS — I10 Essential (primary) hypertension: Secondary | ICD-10-CM | POA: Diagnosis not present

## 2016-11-28 DIAGNOSIS — I208 Other forms of angina pectoris: Secondary | ICD-10-CM | POA: Diagnosis not present

## 2016-11-28 DIAGNOSIS — I509 Heart failure, unspecified: Secondary | ICD-10-CM | POA: Diagnosis not present

## 2016-11-29 DIAGNOSIS — D509 Iron deficiency anemia, unspecified: Secondary | ICD-10-CM | POA: Diagnosis not present

## 2016-11-29 DIAGNOSIS — Z992 Dependence on renal dialysis: Secondary | ICD-10-CM | POA: Diagnosis not present

## 2016-11-29 DIAGNOSIS — N186 End stage renal disease: Secondary | ICD-10-CM | POA: Diagnosis not present

## 2016-11-29 DIAGNOSIS — D631 Anemia in chronic kidney disease: Secondary | ICD-10-CM | POA: Diagnosis not present

## 2016-11-29 DIAGNOSIS — N2581 Secondary hyperparathyroidism of renal origin: Secondary | ICD-10-CM | POA: Diagnosis not present

## 2016-12-01 DIAGNOSIS — D509 Iron deficiency anemia, unspecified: Secondary | ICD-10-CM | POA: Diagnosis not present

## 2016-12-01 DIAGNOSIS — N186 End stage renal disease: Secondary | ICD-10-CM | POA: Diagnosis not present

## 2016-12-01 DIAGNOSIS — Z992 Dependence on renal dialysis: Secondary | ICD-10-CM | POA: Diagnosis not present

## 2016-12-01 DIAGNOSIS — D631 Anemia in chronic kidney disease: Secondary | ICD-10-CM | POA: Diagnosis not present

## 2016-12-01 DIAGNOSIS — N2581 Secondary hyperparathyroidism of renal origin: Secondary | ICD-10-CM | POA: Diagnosis not present

## 2016-12-04 DIAGNOSIS — Z992 Dependence on renal dialysis: Secondary | ICD-10-CM | POA: Diagnosis not present

## 2016-12-04 DIAGNOSIS — D631 Anemia in chronic kidney disease: Secondary | ICD-10-CM | POA: Diagnosis not present

## 2016-12-04 DIAGNOSIS — D509 Iron deficiency anemia, unspecified: Secondary | ICD-10-CM | POA: Diagnosis not present

## 2016-12-04 DIAGNOSIS — N186 End stage renal disease: Secondary | ICD-10-CM | POA: Diagnosis not present

## 2016-12-04 DIAGNOSIS — N2581 Secondary hyperparathyroidism of renal origin: Secondary | ICD-10-CM | POA: Diagnosis not present

## 2016-12-06 DIAGNOSIS — D509 Iron deficiency anemia, unspecified: Secondary | ICD-10-CM | POA: Diagnosis not present

## 2016-12-06 DIAGNOSIS — D631 Anemia in chronic kidney disease: Secondary | ICD-10-CM | POA: Diagnosis not present

## 2016-12-06 DIAGNOSIS — N2581 Secondary hyperparathyroidism of renal origin: Secondary | ICD-10-CM | POA: Diagnosis not present

## 2016-12-06 DIAGNOSIS — Z992 Dependence on renal dialysis: Secondary | ICD-10-CM | POA: Diagnosis not present

## 2016-12-06 DIAGNOSIS — N186 End stage renal disease: Secondary | ICD-10-CM | POA: Diagnosis not present

## 2016-12-07 DIAGNOSIS — Z992 Dependence on renal dialysis: Secondary | ICD-10-CM | POA: Diagnosis not present

## 2016-12-07 DIAGNOSIS — N186 End stage renal disease: Secondary | ICD-10-CM | POA: Diagnosis not present

## 2016-12-08 DIAGNOSIS — D509 Iron deficiency anemia, unspecified: Secondary | ICD-10-CM | POA: Diagnosis not present

## 2016-12-08 DIAGNOSIS — Z992 Dependence on renal dialysis: Secondary | ICD-10-CM | POA: Diagnosis not present

## 2016-12-08 DIAGNOSIS — D631 Anemia in chronic kidney disease: Secondary | ICD-10-CM | POA: Diagnosis not present

## 2016-12-08 DIAGNOSIS — N186 End stage renal disease: Secondary | ICD-10-CM | POA: Diagnosis not present

## 2016-12-08 DIAGNOSIS — N2581 Secondary hyperparathyroidism of renal origin: Secondary | ICD-10-CM | POA: Diagnosis not present

## 2016-12-11 DIAGNOSIS — D509 Iron deficiency anemia, unspecified: Secondary | ICD-10-CM | POA: Diagnosis not present

## 2016-12-11 DIAGNOSIS — N186 End stage renal disease: Secondary | ICD-10-CM | POA: Diagnosis not present

## 2016-12-11 DIAGNOSIS — D631 Anemia in chronic kidney disease: Secondary | ICD-10-CM | POA: Diagnosis not present

## 2016-12-11 DIAGNOSIS — Z992 Dependence on renal dialysis: Secondary | ICD-10-CM | POA: Diagnosis not present

## 2016-12-11 DIAGNOSIS — N2581 Secondary hyperparathyroidism of renal origin: Secondary | ICD-10-CM | POA: Diagnosis not present

## 2016-12-13 DIAGNOSIS — N2581 Secondary hyperparathyroidism of renal origin: Secondary | ICD-10-CM | POA: Diagnosis not present

## 2016-12-13 DIAGNOSIS — Z992 Dependence on renal dialysis: Secondary | ICD-10-CM | POA: Diagnosis not present

## 2016-12-13 DIAGNOSIS — D509 Iron deficiency anemia, unspecified: Secondary | ICD-10-CM | POA: Diagnosis not present

## 2016-12-13 DIAGNOSIS — N186 End stage renal disease: Secondary | ICD-10-CM | POA: Diagnosis not present

## 2016-12-13 DIAGNOSIS — D631 Anemia in chronic kidney disease: Secondary | ICD-10-CM | POA: Diagnosis not present

## 2016-12-15 DIAGNOSIS — D509 Iron deficiency anemia, unspecified: Secondary | ICD-10-CM | POA: Diagnosis not present

## 2016-12-15 DIAGNOSIS — N2581 Secondary hyperparathyroidism of renal origin: Secondary | ICD-10-CM | POA: Diagnosis not present

## 2016-12-15 DIAGNOSIS — D631 Anemia in chronic kidney disease: Secondary | ICD-10-CM | POA: Diagnosis not present

## 2016-12-15 DIAGNOSIS — N186 End stage renal disease: Secondary | ICD-10-CM | POA: Diagnosis not present

## 2016-12-15 DIAGNOSIS — Z992 Dependence on renal dialysis: Secondary | ICD-10-CM | POA: Diagnosis not present

## 2016-12-18 DIAGNOSIS — D509 Iron deficiency anemia, unspecified: Secondary | ICD-10-CM | POA: Diagnosis not present

## 2016-12-18 DIAGNOSIS — N186 End stage renal disease: Secondary | ICD-10-CM | POA: Diagnosis not present

## 2016-12-18 DIAGNOSIS — Z992 Dependence on renal dialysis: Secondary | ICD-10-CM | POA: Diagnosis not present

## 2016-12-18 DIAGNOSIS — D631 Anemia in chronic kidney disease: Secondary | ICD-10-CM | POA: Diagnosis not present

## 2016-12-18 DIAGNOSIS — N2581 Secondary hyperparathyroidism of renal origin: Secondary | ICD-10-CM | POA: Diagnosis not present

## 2016-12-20 DIAGNOSIS — Z992 Dependence on renal dialysis: Secondary | ICD-10-CM | POA: Diagnosis not present

## 2016-12-20 DIAGNOSIS — D631 Anemia in chronic kidney disease: Secondary | ICD-10-CM | POA: Diagnosis not present

## 2016-12-20 DIAGNOSIS — N186 End stage renal disease: Secondary | ICD-10-CM | POA: Diagnosis not present

## 2016-12-20 DIAGNOSIS — D509 Iron deficiency anemia, unspecified: Secondary | ICD-10-CM | POA: Diagnosis not present

## 2016-12-20 DIAGNOSIS — N2581 Secondary hyperparathyroidism of renal origin: Secondary | ICD-10-CM | POA: Diagnosis not present

## 2016-12-22 DIAGNOSIS — Z992 Dependence on renal dialysis: Secondary | ICD-10-CM | POA: Diagnosis not present

## 2016-12-22 DIAGNOSIS — D509 Iron deficiency anemia, unspecified: Secondary | ICD-10-CM | POA: Diagnosis not present

## 2016-12-22 DIAGNOSIS — N2581 Secondary hyperparathyroidism of renal origin: Secondary | ICD-10-CM | POA: Diagnosis not present

## 2016-12-22 DIAGNOSIS — N186 End stage renal disease: Secondary | ICD-10-CM | POA: Diagnosis not present

## 2016-12-22 DIAGNOSIS — D631 Anemia in chronic kidney disease: Secondary | ICD-10-CM | POA: Diagnosis not present

## 2016-12-25 DIAGNOSIS — D509 Iron deficiency anemia, unspecified: Secondary | ICD-10-CM | POA: Diagnosis not present

## 2016-12-25 DIAGNOSIS — Z992 Dependence on renal dialysis: Secondary | ICD-10-CM | POA: Diagnosis not present

## 2016-12-25 DIAGNOSIS — N186 End stage renal disease: Secondary | ICD-10-CM | POA: Diagnosis not present

## 2016-12-25 DIAGNOSIS — N2581 Secondary hyperparathyroidism of renal origin: Secondary | ICD-10-CM | POA: Diagnosis not present

## 2016-12-25 DIAGNOSIS — D631 Anemia in chronic kidney disease: Secondary | ICD-10-CM | POA: Diagnosis not present

## 2016-12-26 DIAGNOSIS — L97521 Non-pressure chronic ulcer of other part of left foot limited to breakdown of skin: Secondary | ICD-10-CM | POA: Diagnosis not present

## 2016-12-26 DIAGNOSIS — R234 Changes in skin texture: Secondary | ICD-10-CM | POA: Diagnosis not present

## 2016-12-26 DIAGNOSIS — L97511 Non-pressure chronic ulcer of other part of right foot limited to breakdown of skin: Secondary | ICD-10-CM | POA: Diagnosis not present

## 2016-12-26 DIAGNOSIS — Z794 Long term (current) use of insulin: Secondary | ICD-10-CM | POA: Diagnosis not present

## 2016-12-26 DIAGNOSIS — E1140 Type 2 diabetes mellitus with diabetic neuropathy, unspecified: Secondary | ICD-10-CM | POA: Diagnosis not present

## 2016-12-26 DIAGNOSIS — B351 Tinea unguium: Secondary | ICD-10-CM | POA: Diagnosis not present

## 2016-12-27 DIAGNOSIS — Z992 Dependence on renal dialysis: Secondary | ICD-10-CM | POA: Diagnosis not present

## 2016-12-27 DIAGNOSIS — D631 Anemia in chronic kidney disease: Secondary | ICD-10-CM | POA: Diagnosis not present

## 2016-12-27 DIAGNOSIS — N186 End stage renal disease: Secondary | ICD-10-CM | POA: Diagnosis not present

## 2016-12-27 DIAGNOSIS — N2581 Secondary hyperparathyroidism of renal origin: Secondary | ICD-10-CM | POA: Diagnosis not present

## 2016-12-27 DIAGNOSIS — D509 Iron deficiency anemia, unspecified: Secondary | ICD-10-CM | POA: Diagnosis not present

## 2016-12-29 DIAGNOSIS — N186 End stage renal disease: Secondary | ICD-10-CM | POA: Diagnosis not present

## 2016-12-29 DIAGNOSIS — D631 Anemia in chronic kidney disease: Secondary | ICD-10-CM | POA: Diagnosis not present

## 2016-12-29 DIAGNOSIS — Z992 Dependence on renal dialysis: Secondary | ICD-10-CM | POA: Diagnosis not present

## 2016-12-29 DIAGNOSIS — D509 Iron deficiency anemia, unspecified: Secondary | ICD-10-CM | POA: Diagnosis not present

## 2016-12-29 DIAGNOSIS — N2581 Secondary hyperparathyroidism of renal origin: Secondary | ICD-10-CM | POA: Diagnosis not present

## 2017-01-01 DIAGNOSIS — N186 End stage renal disease: Secondary | ICD-10-CM | POA: Diagnosis not present

## 2017-01-01 DIAGNOSIS — N2581 Secondary hyperparathyroidism of renal origin: Secondary | ICD-10-CM | POA: Diagnosis not present

## 2017-01-01 DIAGNOSIS — Z992 Dependence on renal dialysis: Secondary | ICD-10-CM | POA: Diagnosis not present

## 2017-01-01 DIAGNOSIS — D631 Anemia in chronic kidney disease: Secondary | ICD-10-CM | POA: Diagnosis not present

## 2017-01-01 DIAGNOSIS — D509 Iron deficiency anemia, unspecified: Secondary | ICD-10-CM | POA: Diagnosis not present

## 2017-01-03 DIAGNOSIS — Z992 Dependence on renal dialysis: Secondary | ICD-10-CM | POA: Diagnosis not present

## 2017-01-03 DIAGNOSIS — N186 End stage renal disease: Secondary | ICD-10-CM | POA: Diagnosis not present

## 2017-01-03 DIAGNOSIS — N2581 Secondary hyperparathyroidism of renal origin: Secondary | ICD-10-CM | POA: Diagnosis not present

## 2017-01-03 DIAGNOSIS — D631 Anemia in chronic kidney disease: Secondary | ICD-10-CM | POA: Diagnosis not present

## 2017-01-03 DIAGNOSIS — D509 Iron deficiency anemia, unspecified: Secondary | ICD-10-CM | POA: Diagnosis not present

## 2017-01-05 DIAGNOSIS — D509 Iron deficiency anemia, unspecified: Secondary | ICD-10-CM | POA: Diagnosis not present

## 2017-01-05 DIAGNOSIS — D631 Anemia in chronic kidney disease: Secondary | ICD-10-CM | POA: Diagnosis not present

## 2017-01-05 DIAGNOSIS — N2581 Secondary hyperparathyroidism of renal origin: Secondary | ICD-10-CM | POA: Diagnosis not present

## 2017-01-05 DIAGNOSIS — Z992 Dependence on renal dialysis: Secondary | ICD-10-CM | POA: Diagnosis not present

## 2017-01-05 DIAGNOSIS — N186 End stage renal disease: Secondary | ICD-10-CM | POA: Diagnosis not present

## 2017-01-06 DIAGNOSIS — Z992 Dependence on renal dialysis: Secondary | ICD-10-CM | POA: Diagnosis not present

## 2017-01-06 DIAGNOSIS — N186 End stage renal disease: Secondary | ICD-10-CM | POA: Diagnosis not present

## 2017-01-08 DIAGNOSIS — D509 Iron deficiency anemia, unspecified: Secondary | ICD-10-CM | POA: Diagnosis not present

## 2017-01-08 DIAGNOSIS — Z992 Dependence on renal dialysis: Secondary | ICD-10-CM | POA: Diagnosis not present

## 2017-01-08 DIAGNOSIS — D631 Anemia in chronic kidney disease: Secondary | ICD-10-CM | POA: Diagnosis not present

## 2017-01-08 DIAGNOSIS — N186 End stage renal disease: Secondary | ICD-10-CM | POA: Diagnosis not present

## 2017-01-08 DIAGNOSIS — N2581 Secondary hyperparathyroidism of renal origin: Secondary | ICD-10-CM | POA: Diagnosis not present

## 2017-01-10 DIAGNOSIS — D631 Anemia in chronic kidney disease: Secondary | ICD-10-CM | POA: Diagnosis not present

## 2017-01-10 DIAGNOSIS — D509 Iron deficiency anemia, unspecified: Secondary | ICD-10-CM | POA: Diagnosis not present

## 2017-01-10 DIAGNOSIS — N2581 Secondary hyperparathyroidism of renal origin: Secondary | ICD-10-CM | POA: Diagnosis not present

## 2017-01-10 DIAGNOSIS — N186 End stage renal disease: Secondary | ICD-10-CM | POA: Diagnosis not present

## 2017-01-10 DIAGNOSIS — Z992 Dependence on renal dialysis: Secondary | ICD-10-CM | POA: Diagnosis not present

## 2017-01-12 DIAGNOSIS — D509 Iron deficiency anemia, unspecified: Secondary | ICD-10-CM | POA: Diagnosis not present

## 2017-01-12 DIAGNOSIS — D631 Anemia in chronic kidney disease: Secondary | ICD-10-CM | POA: Diagnosis not present

## 2017-01-12 DIAGNOSIS — N2581 Secondary hyperparathyroidism of renal origin: Secondary | ICD-10-CM | POA: Diagnosis not present

## 2017-01-12 DIAGNOSIS — N186 End stage renal disease: Secondary | ICD-10-CM | POA: Diagnosis not present

## 2017-01-12 DIAGNOSIS — Z992 Dependence on renal dialysis: Secondary | ICD-10-CM | POA: Diagnosis not present

## 2017-01-15 DIAGNOSIS — E119 Type 2 diabetes mellitus without complications: Secondary | ICD-10-CM | POA: Diagnosis not present

## 2017-01-15 DIAGNOSIS — D631 Anemia in chronic kidney disease: Secondary | ICD-10-CM | POA: Diagnosis not present

## 2017-01-15 DIAGNOSIS — Z992 Dependence on renal dialysis: Secondary | ICD-10-CM | POA: Diagnosis not present

## 2017-01-15 DIAGNOSIS — N2581 Secondary hyperparathyroidism of renal origin: Secondary | ICD-10-CM | POA: Diagnosis not present

## 2017-01-15 DIAGNOSIS — D509 Iron deficiency anemia, unspecified: Secondary | ICD-10-CM | POA: Diagnosis not present

## 2017-01-15 DIAGNOSIS — N186 End stage renal disease: Secondary | ICD-10-CM | POA: Diagnosis not present

## 2017-01-17 DIAGNOSIS — D509 Iron deficiency anemia, unspecified: Secondary | ICD-10-CM | POA: Diagnosis not present

## 2017-01-17 DIAGNOSIS — N2581 Secondary hyperparathyroidism of renal origin: Secondary | ICD-10-CM | POA: Diagnosis not present

## 2017-01-17 DIAGNOSIS — Z992 Dependence on renal dialysis: Secondary | ICD-10-CM | POA: Diagnosis not present

## 2017-01-17 DIAGNOSIS — D631 Anemia in chronic kidney disease: Secondary | ICD-10-CM | POA: Diagnosis not present

## 2017-01-17 DIAGNOSIS — N186 End stage renal disease: Secondary | ICD-10-CM | POA: Diagnosis not present

## 2017-01-19 DIAGNOSIS — D509 Iron deficiency anemia, unspecified: Secondary | ICD-10-CM | POA: Diagnosis not present

## 2017-01-19 DIAGNOSIS — D631 Anemia in chronic kidney disease: Secondary | ICD-10-CM | POA: Diagnosis not present

## 2017-01-19 DIAGNOSIS — N186 End stage renal disease: Secondary | ICD-10-CM | POA: Diagnosis not present

## 2017-01-19 DIAGNOSIS — N2581 Secondary hyperparathyroidism of renal origin: Secondary | ICD-10-CM | POA: Diagnosis not present

## 2017-01-19 DIAGNOSIS — Z992 Dependence on renal dialysis: Secondary | ICD-10-CM | POA: Diagnosis not present

## 2017-01-22 DIAGNOSIS — Z992 Dependence on renal dialysis: Secondary | ICD-10-CM | POA: Diagnosis not present

## 2017-01-22 DIAGNOSIS — D631 Anemia in chronic kidney disease: Secondary | ICD-10-CM | POA: Diagnosis not present

## 2017-01-22 DIAGNOSIS — D509 Iron deficiency anemia, unspecified: Secondary | ICD-10-CM | POA: Diagnosis not present

## 2017-01-22 DIAGNOSIS — N2581 Secondary hyperparathyroidism of renal origin: Secondary | ICD-10-CM | POA: Diagnosis not present

## 2017-01-22 DIAGNOSIS — N186 End stage renal disease: Secondary | ICD-10-CM | POA: Diagnosis not present

## 2017-01-24 DIAGNOSIS — D631 Anemia in chronic kidney disease: Secondary | ICD-10-CM | POA: Diagnosis not present

## 2017-01-24 DIAGNOSIS — N2581 Secondary hyperparathyroidism of renal origin: Secondary | ICD-10-CM | POA: Diagnosis not present

## 2017-01-24 DIAGNOSIS — Z992 Dependence on renal dialysis: Secondary | ICD-10-CM | POA: Diagnosis not present

## 2017-01-24 DIAGNOSIS — N186 End stage renal disease: Secondary | ICD-10-CM | POA: Diagnosis not present

## 2017-01-24 DIAGNOSIS — D509 Iron deficiency anemia, unspecified: Secondary | ICD-10-CM | POA: Diagnosis not present

## 2017-01-26 DIAGNOSIS — N186 End stage renal disease: Secondary | ICD-10-CM | POA: Diagnosis not present

## 2017-01-26 DIAGNOSIS — D631 Anemia in chronic kidney disease: Secondary | ICD-10-CM | POA: Diagnosis not present

## 2017-01-26 DIAGNOSIS — Z992 Dependence on renal dialysis: Secondary | ICD-10-CM | POA: Diagnosis not present

## 2017-01-26 DIAGNOSIS — D509 Iron deficiency anemia, unspecified: Secondary | ICD-10-CM | POA: Diagnosis not present

## 2017-01-26 DIAGNOSIS — N2581 Secondary hyperparathyroidism of renal origin: Secondary | ICD-10-CM | POA: Diagnosis not present

## 2017-01-29 DIAGNOSIS — N186 End stage renal disease: Secondary | ICD-10-CM | POA: Diagnosis not present

## 2017-01-29 DIAGNOSIS — N2581 Secondary hyperparathyroidism of renal origin: Secondary | ICD-10-CM | POA: Diagnosis not present

## 2017-01-29 DIAGNOSIS — Z992 Dependence on renal dialysis: Secondary | ICD-10-CM | POA: Diagnosis not present

## 2017-01-29 DIAGNOSIS — D631 Anemia in chronic kidney disease: Secondary | ICD-10-CM | POA: Diagnosis not present

## 2017-01-29 DIAGNOSIS — D509 Iron deficiency anemia, unspecified: Secondary | ICD-10-CM | POA: Diagnosis not present

## 2017-01-31 DIAGNOSIS — Z992 Dependence on renal dialysis: Secondary | ICD-10-CM | POA: Diagnosis not present

## 2017-01-31 DIAGNOSIS — N2581 Secondary hyperparathyroidism of renal origin: Secondary | ICD-10-CM | POA: Diagnosis not present

## 2017-01-31 DIAGNOSIS — N186 End stage renal disease: Secondary | ICD-10-CM | POA: Diagnosis not present

## 2017-01-31 DIAGNOSIS — D631 Anemia in chronic kidney disease: Secondary | ICD-10-CM | POA: Diagnosis not present

## 2017-01-31 DIAGNOSIS — D509 Iron deficiency anemia, unspecified: Secondary | ICD-10-CM | POA: Diagnosis not present

## 2017-02-02 DIAGNOSIS — Z992 Dependence on renal dialysis: Secondary | ICD-10-CM | POA: Diagnosis not present

## 2017-02-02 DIAGNOSIS — D509 Iron deficiency anemia, unspecified: Secondary | ICD-10-CM | POA: Diagnosis not present

## 2017-02-02 DIAGNOSIS — D631 Anemia in chronic kidney disease: Secondary | ICD-10-CM | POA: Diagnosis not present

## 2017-02-02 DIAGNOSIS — N186 End stage renal disease: Secondary | ICD-10-CM | POA: Diagnosis not present

## 2017-02-02 DIAGNOSIS — N2581 Secondary hyperparathyroidism of renal origin: Secondary | ICD-10-CM | POA: Diagnosis not present

## 2017-02-05 DIAGNOSIS — N2581 Secondary hyperparathyroidism of renal origin: Secondary | ICD-10-CM | POA: Diagnosis not present

## 2017-02-05 DIAGNOSIS — Z992 Dependence on renal dialysis: Secondary | ICD-10-CM | POA: Diagnosis not present

## 2017-02-05 DIAGNOSIS — N186 End stage renal disease: Secondary | ICD-10-CM | POA: Diagnosis not present

## 2017-02-05 DIAGNOSIS — D631 Anemia in chronic kidney disease: Secondary | ICD-10-CM | POA: Diagnosis not present

## 2017-02-05 DIAGNOSIS — D509 Iron deficiency anemia, unspecified: Secondary | ICD-10-CM | POA: Diagnosis not present

## 2017-02-06 DIAGNOSIS — Z992 Dependence on renal dialysis: Secondary | ICD-10-CM | POA: Diagnosis not present

## 2017-02-06 DIAGNOSIS — N186 End stage renal disease: Secondary | ICD-10-CM | POA: Diagnosis not present

## 2017-02-07 DIAGNOSIS — D631 Anemia in chronic kidney disease: Secondary | ICD-10-CM | POA: Diagnosis not present

## 2017-02-07 DIAGNOSIS — Z992 Dependence on renal dialysis: Secondary | ICD-10-CM | POA: Diagnosis not present

## 2017-02-07 DIAGNOSIS — N186 End stage renal disease: Secondary | ICD-10-CM | POA: Diagnosis not present

## 2017-02-07 DIAGNOSIS — N2581 Secondary hyperparathyroidism of renal origin: Secondary | ICD-10-CM | POA: Diagnosis not present

## 2017-02-07 DIAGNOSIS — D509 Iron deficiency anemia, unspecified: Secondary | ICD-10-CM | POA: Diagnosis not present

## 2017-02-09 DIAGNOSIS — Z992 Dependence on renal dialysis: Secondary | ICD-10-CM | POA: Diagnosis not present

## 2017-02-09 DIAGNOSIS — D509 Iron deficiency anemia, unspecified: Secondary | ICD-10-CM | POA: Diagnosis not present

## 2017-02-09 DIAGNOSIS — N2581 Secondary hyperparathyroidism of renal origin: Secondary | ICD-10-CM | POA: Diagnosis not present

## 2017-02-09 DIAGNOSIS — N186 End stage renal disease: Secondary | ICD-10-CM | POA: Diagnosis not present

## 2017-02-09 DIAGNOSIS — D631 Anemia in chronic kidney disease: Secondary | ICD-10-CM | POA: Diagnosis not present

## 2017-02-12 DIAGNOSIS — N186 End stage renal disease: Secondary | ICD-10-CM | POA: Diagnosis not present

## 2017-02-12 DIAGNOSIS — Z992 Dependence on renal dialysis: Secondary | ICD-10-CM | POA: Diagnosis not present

## 2017-02-12 DIAGNOSIS — D509 Iron deficiency anemia, unspecified: Secondary | ICD-10-CM | POA: Diagnosis not present

## 2017-02-12 DIAGNOSIS — D631 Anemia in chronic kidney disease: Secondary | ICD-10-CM | POA: Diagnosis not present

## 2017-02-12 DIAGNOSIS — N2581 Secondary hyperparathyroidism of renal origin: Secondary | ICD-10-CM | POA: Diagnosis not present

## 2017-02-14 DIAGNOSIS — D509 Iron deficiency anemia, unspecified: Secondary | ICD-10-CM | POA: Diagnosis not present

## 2017-02-14 DIAGNOSIS — N2581 Secondary hyperparathyroidism of renal origin: Secondary | ICD-10-CM | POA: Diagnosis not present

## 2017-02-14 DIAGNOSIS — D631 Anemia in chronic kidney disease: Secondary | ICD-10-CM | POA: Diagnosis not present

## 2017-02-14 DIAGNOSIS — N186 End stage renal disease: Secondary | ICD-10-CM | POA: Diagnosis not present

## 2017-02-14 DIAGNOSIS — Z992 Dependence on renal dialysis: Secondary | ICD-10-CM | POA: Diagnosis not present

## 2017-02-16 DIAGNOSIS — N186 End stage renal disease: Secondary | ICD-10-CM | POA: Diagnosis not present

## 2017-02-16 DIAGNOSIS — D509 Iron deficiency anemia, unspecified: Secondary | ICD-10-CM | POA: Diagnosis not present

## 2017-02-16 DIAGNOSIS — N2581 Secondary hyperparathyroidism of renal origin: Secondary | ICD-10-CM | POA: Diagnosis not present

## 2017-02-16 DIAGNOSIS — D631 Anemia in chronic kidney disease: Secondary | ICD-10-CM | POA: Diagnosis not present

## 2017-02-16 DIAGNOSIS — Z992 Dependence on renal dialysis: Secondary | ICD-10-CM | POA: Diagnosis not present

## 2017-02-19 DIAGNOSIS — Z992 Dependence on renal dialysis: Secondary | ICD-10-CM | POA: Diagnosis not present

## 2017-02-19 DIAGNOSIS — D509 Iron deficiency anemia, unspecified: Secondary | ICD-10-CM | POA: Diagnosis not present

## 2017-02-19 DIAGNOSIS — N186 End stage renal disease: Secondary | ICD-10-CM | POA: Diagnosis not present

## 2017-02-19 DIAGNOSIS — N2581 Secondary hyperparathyroidism of renal origin: Secondary | ICD-10-CM | POA: Diagnosis not present

## 2017-02-19 DIAGNOSIS — D631 Anemia in chronic kidney disease: Secondary | ICD-10-CM | POA: Diagnosis not present

## 2017-02-21 DIAGNOSIS — N2581 Secondary hyperparathyroidism of renal origin: Secondary | ICD-10-CM | POA: Diagnosis not present

## 2017-02-21 DIAGNOSIS — D509 Iron deficiency anemia, unspecified: Secondary | ICD-10-CM | POA: Diagnosis not present

## 2017-02-21 DIAGNOSIS — Z992 Dependence on renal dialysis: Secondary | ICD-10-CM | POA: Diagnosis not present

## 2017-02-21 DIAGNOSIS — D631 Anemia in chronic kidney disease: Secondary | ICD-10-CM | POA: Diagnosis not present

## 2017-02-21 DIAGNOSIS — N186 End stage renal disease: Secondary | ICD-10-CM | POA: Diagnosis not present

## 2017-02-21 IMAGING — CR DG CHEST 2V
2 series · 2 of 2 positions shown · non-contrast
Comparison: June 15, 2015.

CLINICAL DATA: Shortness of breath with chest pain. History of
renal cell carcinoma

EXAM:
CHEST  2 VIEW

[chest pa]
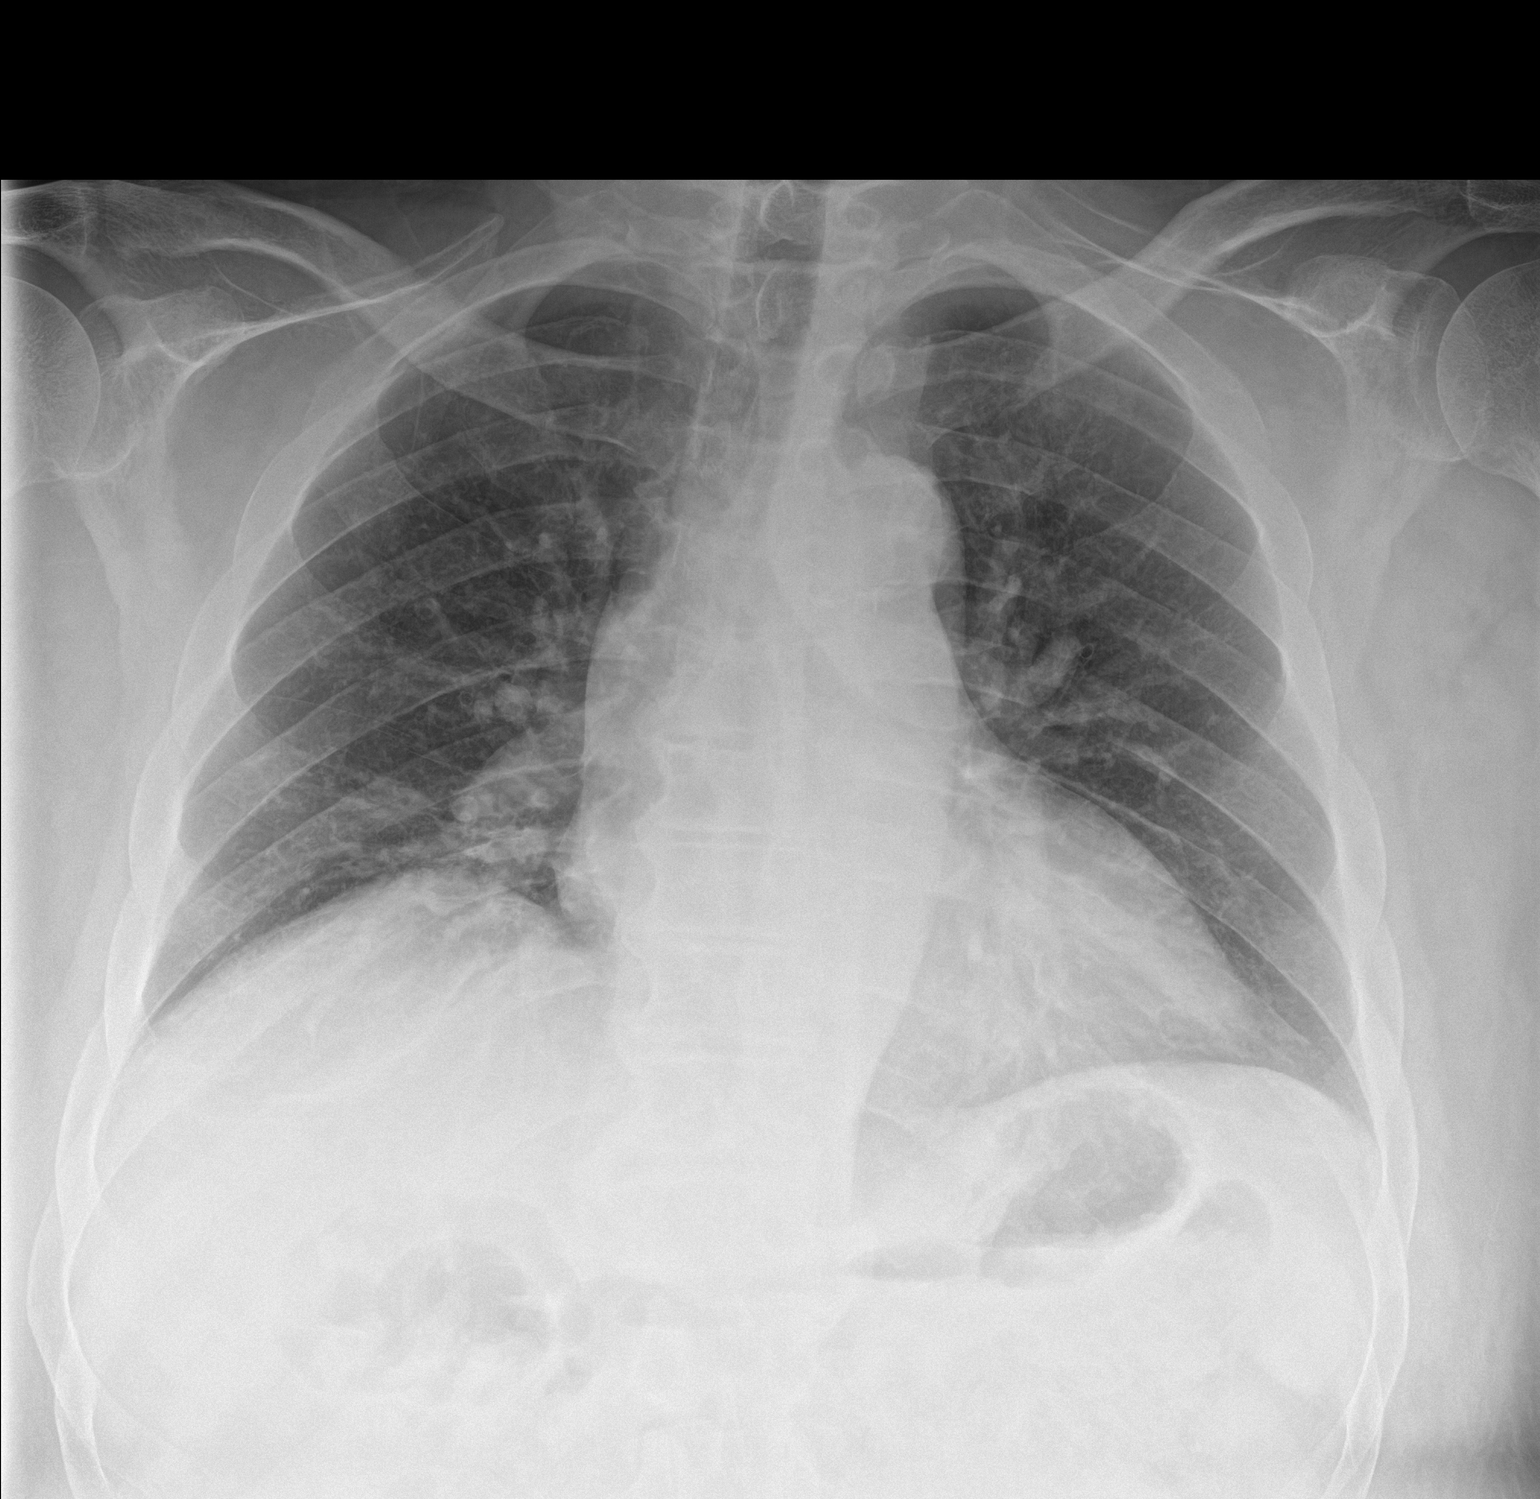

[chest lat]
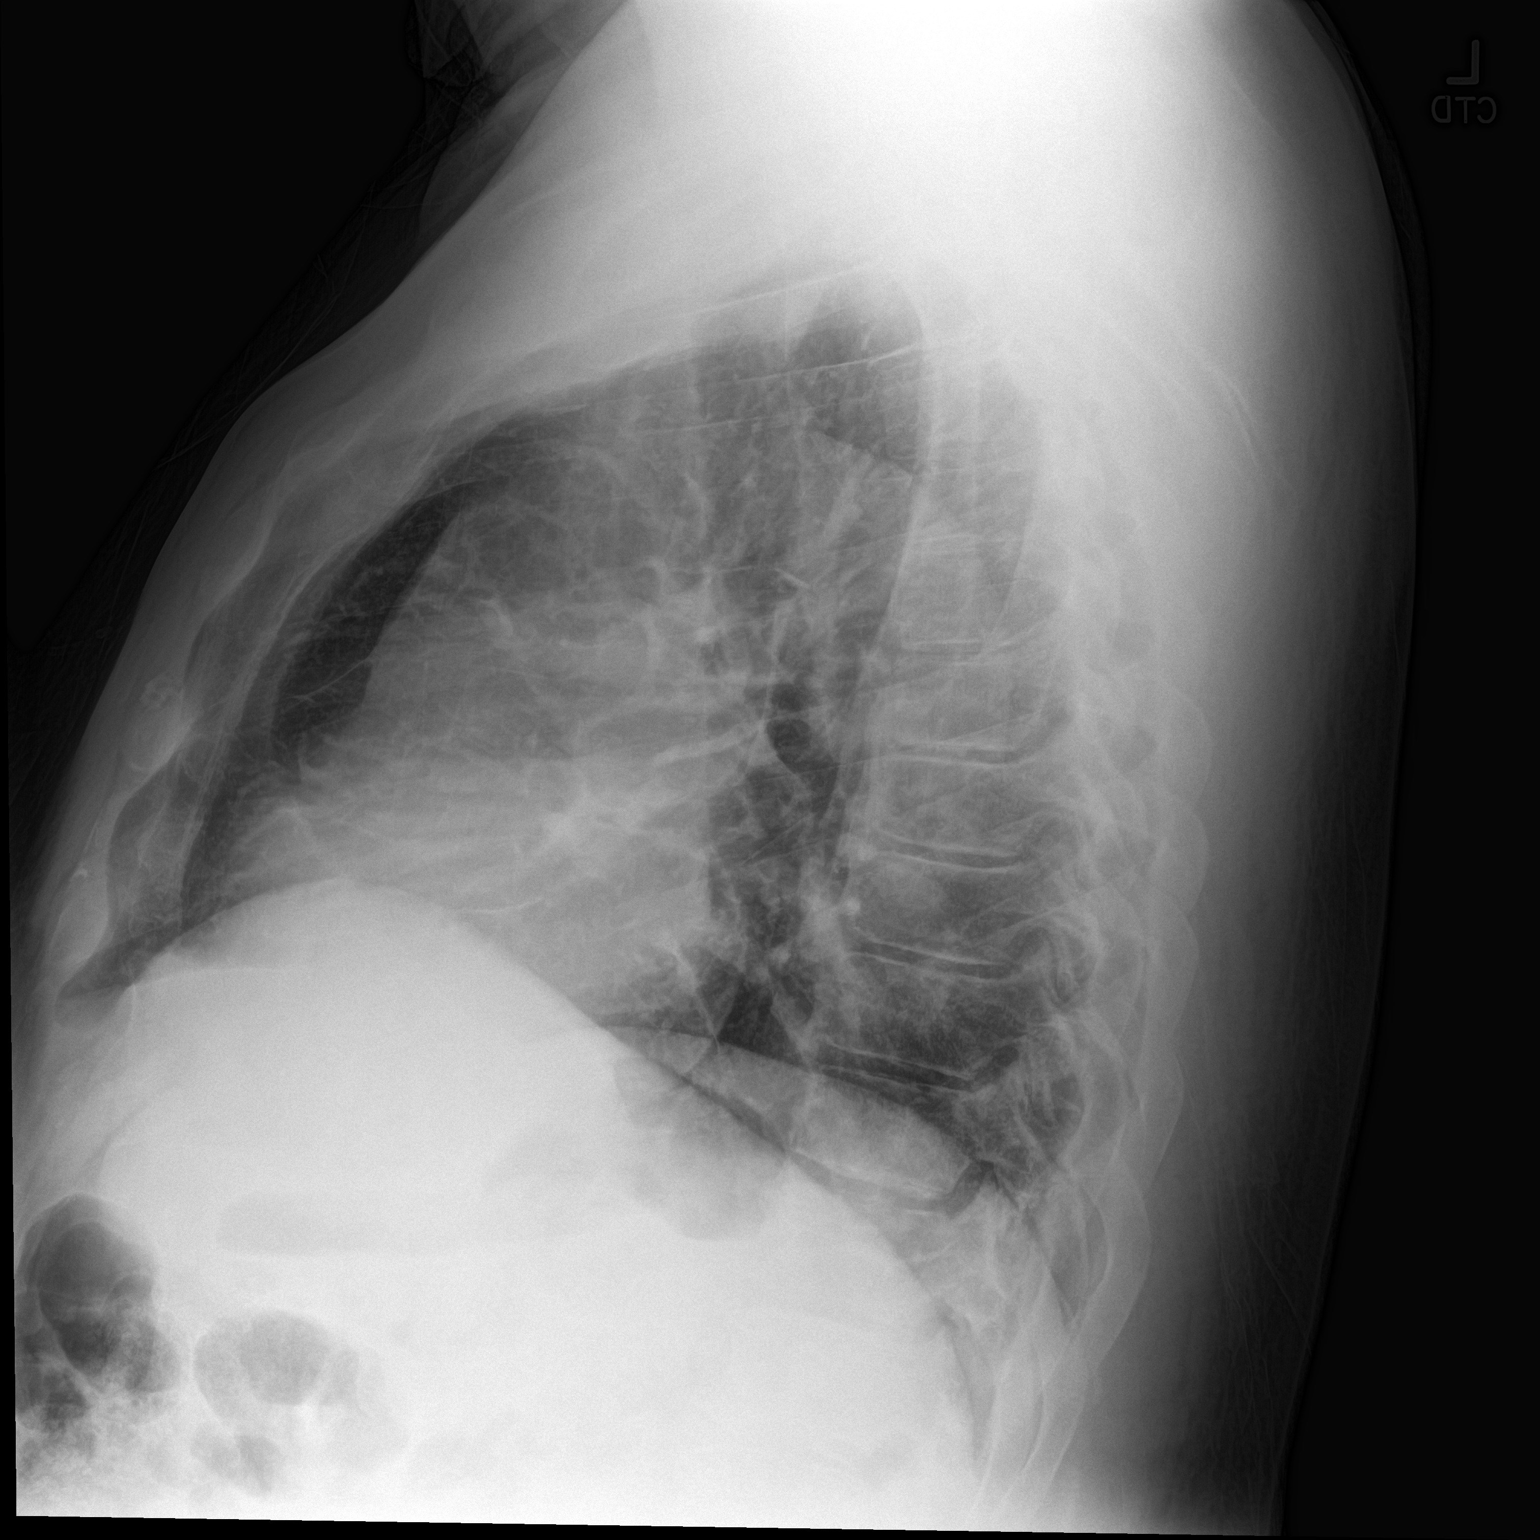

[2 of 2 positions shown; findings below may reference images not displayed]

FINDINGS: There is no edema or consolidation. Heart size and pulmonary
vascularity are normal. No adenopathy. No bone lesions. No
pneumothorax.
IMPRESSION: No edema or consolidation.

## 2017-02-21 IMAGING — CT CT ANGIO CHEST
1 of 2 series · 18 of 32 positions shown · IV contrast (omnipaque)
Comparison: None.

CLINICAL DATA: upper back pain for about 3 weeks, seen by RWANG for
same issue 3 weeks ago and was given medication for gas, patient has
been taking this and it has not been helping. Patient states that
the pain "bounces around" depending on what side he is laying on.

EXAM:
CT ANGIOGRAPHY CHEST WITH CONTRAST
TECHNIQUE: Multidetector CT imaging of the chest was performed using the
standard protocol during bolus administration of intravenous
contrast. Multiplanar CT image reconstructions and MIPs were
obtained to evaluate the vascular anatomy.
CONTRAST:  75mL OMNIPAQUE IOHEXOL 350 MG/ML SOLN

[Series 6: cta chest · axial · 0.77mm/px · z∈[-12,+288]mm · 18 of 166 slices shown]
[im 8/166  lung]
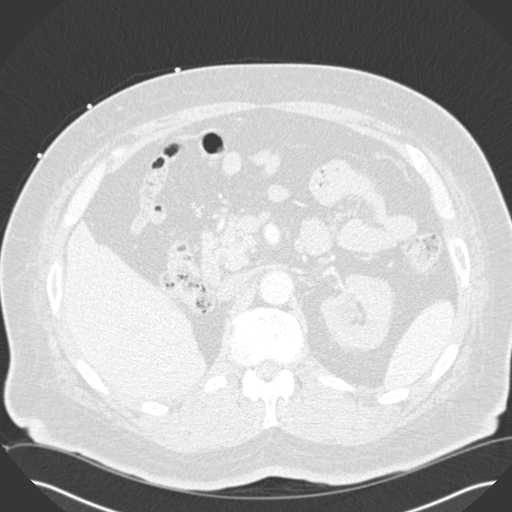
[im 16/166  soft-tissue]
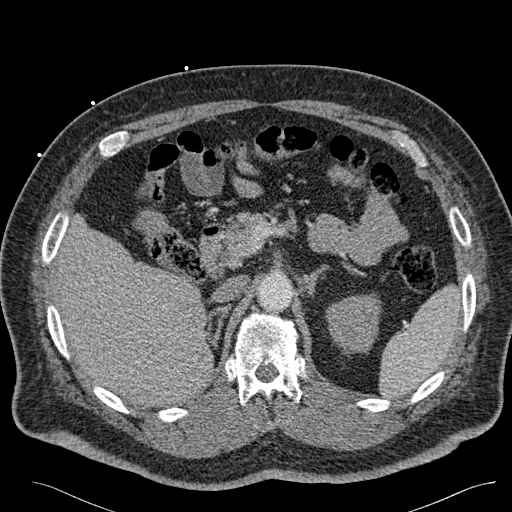
[im 23/166  lung]
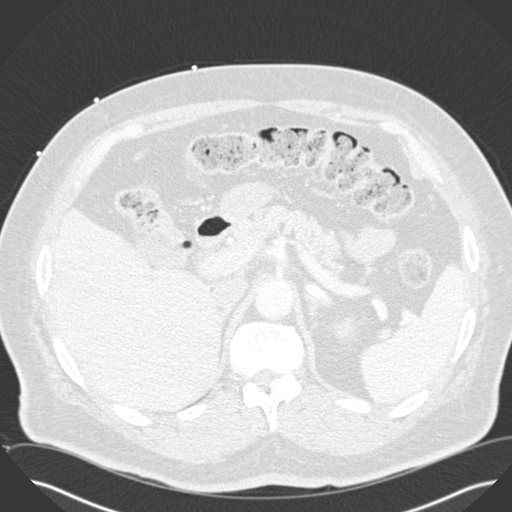
[im 38/166  soft-tissue]
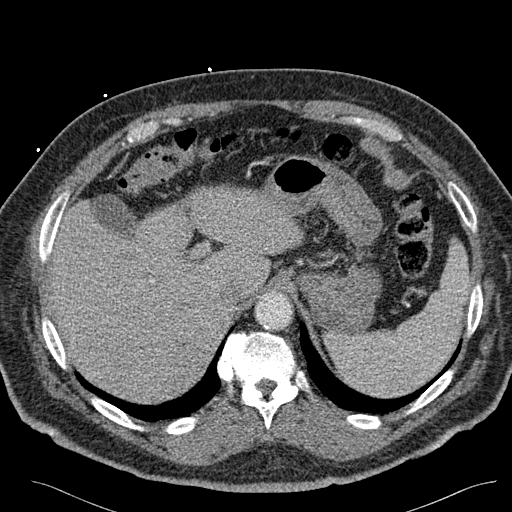
[im 46/166  lung]
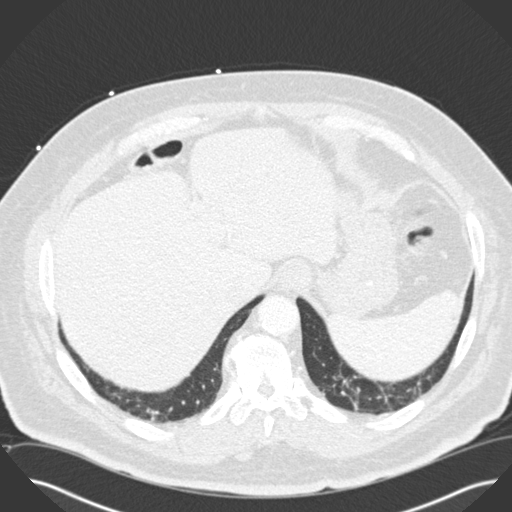
[im 53/166  soft-tissue]
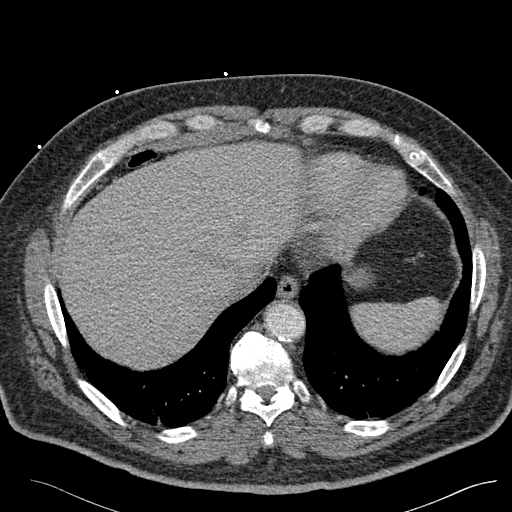
[im 61/166  lung]
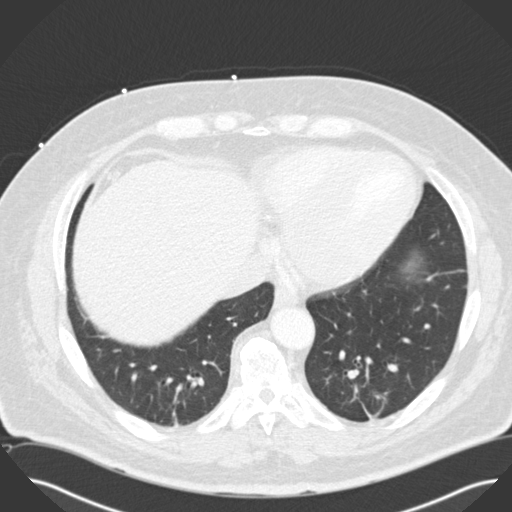
[im 68/166  soft-tissue]
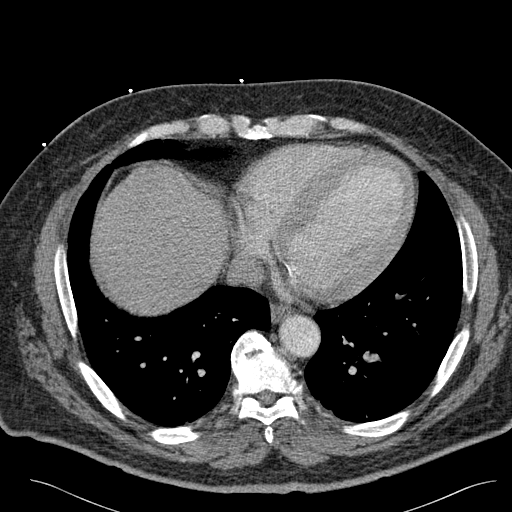
[im 76/166  lung]
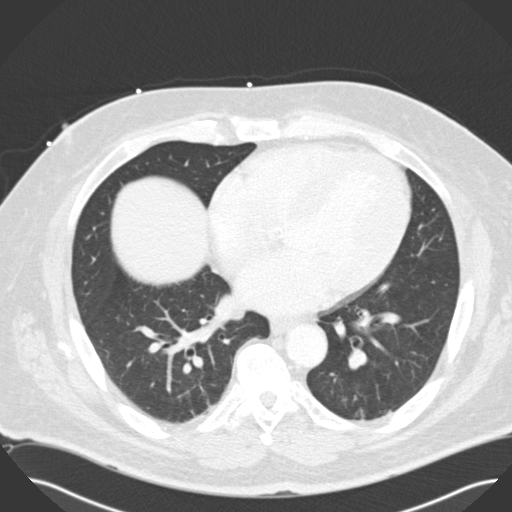
[im 91/166  soft-tissue]
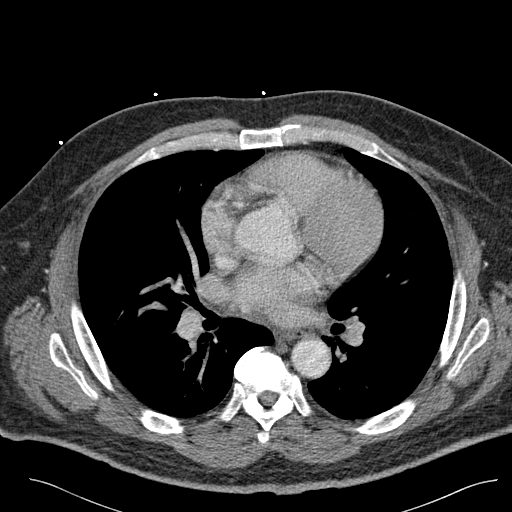
[im 98/166  lung]
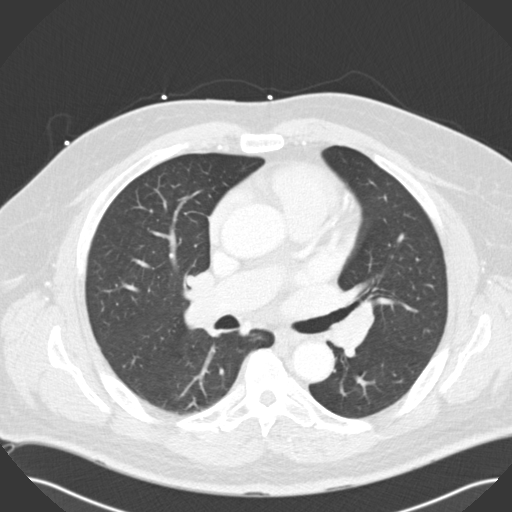
[im 106/166  soft-tissue]
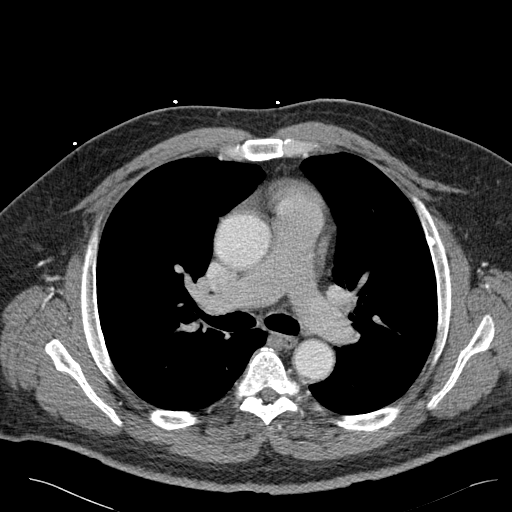
[im 113/166  lung]
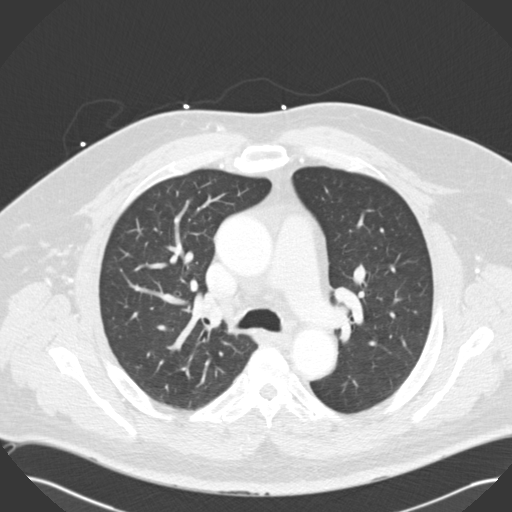
[im 121/166  soft-tissue]
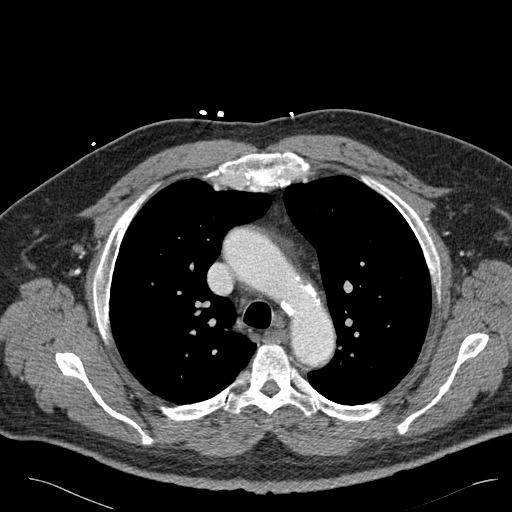
[im 128/166  lung]
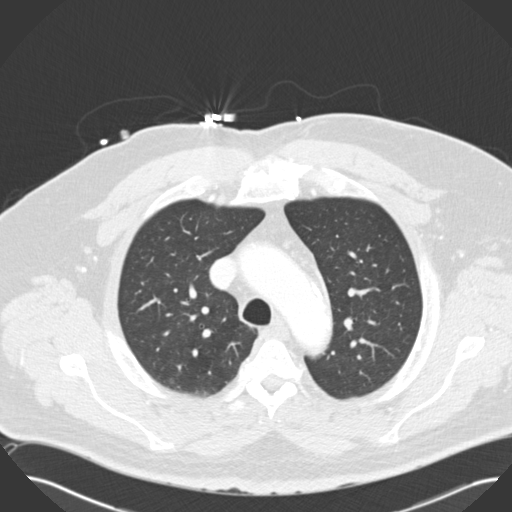
[im 143/166  soft-tissue]
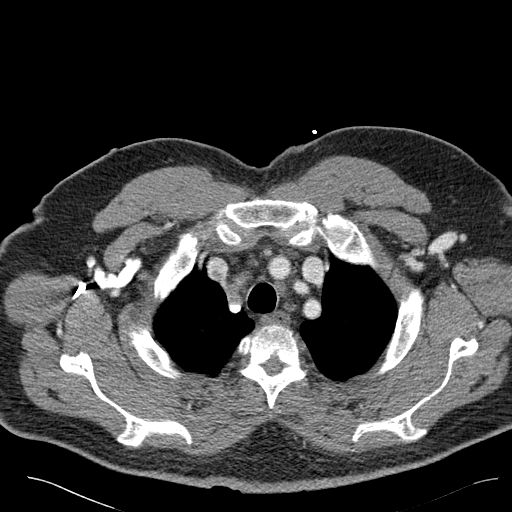
[im 151/166  lung]
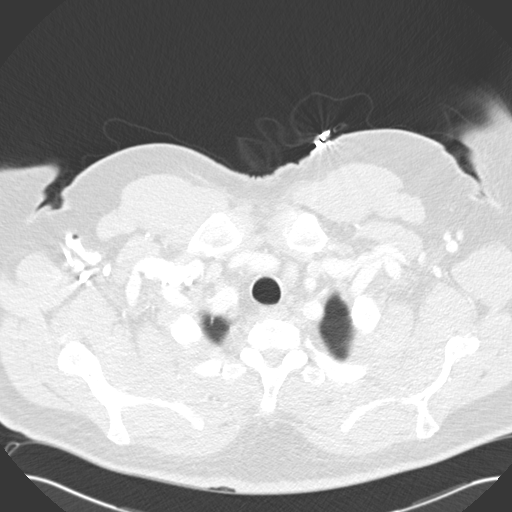
[im 158/166  soft-tissue]
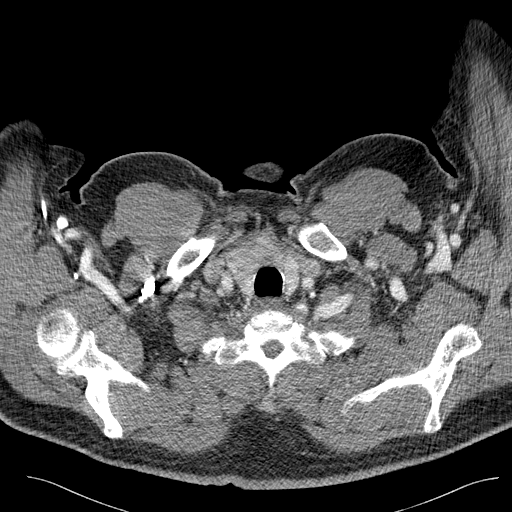

[18 of 32 positions shown; findings below may reference images not displayed]

FINDINGS: Vascular: Right arm IV contrast injection. SVC patent. Right atrium
nondilated. Right ventricle nondilated. Fair contrast of pulmonary
artery branches, with no evidence of large central pulmonary
embolus. Exam not optimized for detection of small pulmonary emboli.
Patent bilateral pulmonary veins. There is an accessory superior
segment right lower lobe pulmonary vein, anatomic variant. Mitral
and aortic valve calcifications. Scattered coronary calcifications.
Borderline dilatation of the ascending aorta, 4.2 maximum transverse
diameter. Proximal arch 3.7 cm diameter. No dissection or stenosis.
No intramural hematoma on noncontrast study. Classic 3 vessel
brachiocephalic arterial origin anatomy without proximal stenosis.
Descending segment normal in caliber, mildly tortuous. Patchy
atheromatous calcifications in the arch and descending thoracic
aorta. Visualized suprarenal abdominal aorta unremarkable.

Mediastinum/Lymph Nodes: No masses or pathologically enlarged lymph
nodes identified. No pericardial effusion.

Lungs/Pleura: No pulmonary mass, infiltrate, or effusion. Minimal
linear scarring or subsegmental atelectasis posteriorly in both
lower lobes.

Upper abdomen: No acute findings.

Musculoskeletal: No chest wall mass or suspicious bone lesions
identified.

Review of the MIP images confirms the above findings.
IMPRESSION: 1. 4.2 cm ascending aortic aneurysm without dissection or other
complicating features. Recommend annual imaging followup by CTA or
MRA. This recommendation follows 6373
ACCF/AHA/AATS/ACR/ASA/SCA/BRESLIN/SANTINI/NEELU/MAUI Guidelines for the
Diagnosis and Management of Patients with Thoracic Aortic Disease.
Circulation. 6373; 121: [id number]
2. Atherosclerosis, including aortic and coronary artery disease.
Please note that although the presence of coronary artery calcium
documents the presence of coronary artery disease, the severity of
this disease and any potential stenosis cannot be assessed on this
non-gated CT examination. Assessment for potential risk factor
modification, dietary therapy or pharmacologic therapy may be
warranted, if clinically indicated.

## 2017-02-23 DIAGNOSIS — D509 Iron deficiency anemia, unspecified: Secondary | ICD-10-CM | POA: Diagnosis not present

## 2017-02-23 DIAGNOSIS — Z992 Dependence on renal dialysis: Secondary | ICD-10-CM | POA: Diagnosis not present

## 2017-02-23 DIAGNOSIS — D631 Anemia in chronic kidney disease: Secondary | ICD-10-CM | POA: Diagnosis not present

## 2017-02-23 DIAGNOSIS — N2581 Secondary hyperparathyroidism of renal origin: Secondary | ICD-10-CM | POA: Diagnosis not present

## 2017-02-23 DIAGNOSIS — N186 End stage renal disease: Secondary | ICD-10-CM | POA: Diagnosis not present

## 2017-02-26 DIAGNOSIS — Z992 Dependence on renal dialysis: Secondary | ICD-10-CM | POA: Diagnosis not present

## 2017-02-26 DIAGNOSIS — D509 Iron deficiency anemia, unspecified: Secondary | ICD-10-CM | POA: Diagnosis not present

## 2017-02-26 DIAGNOSIS — N2581 Secondary hyperparathyroidism of renal origin: Secondary | ICD-10-CM | POA: Diagnosis not present

## 2017-02-26 DIAGNOSIS — D631 Anemia in chronic kidney disease: Secondary | ICD-10-CM | POA: Diagnosis not present

## 2017-02-26 DIAGNOSIS — N186 End stage renal disease: Secondary | ICD-10-CM | POA: Diagnosis not present

## 2017-02-28 DIAGNOSIS — D631 Anemia in chronic kidney disease: Secondary | ICD-10-CM | POA: Diagnosis not present

## 2017-02-28 DIAGNOSIS — Z992 Dependence on renal dialysis: Secondary | ICD-10-CM | POA: Diagnosis not present

## 2017-02-28 DIAGNOSIS — D509 Iron deficiency anemia, unspecified: Secondary | ICD-10-CM | POA: Diagnosis not present

## 2017-02-28 DIAGNOSIS — N2581 Secondary hyperparathyroidism of renal origin: Secondary | ICD-10-CM | POA: Diagnosis not present

## 2017-02-28 DIAGNOSIS — N186 End stage renal disease: Secondary | ICD-10-CM | POA: Diagnosis not present

## 2017-03-02 DIAGNOSIS — D509 Iron deficiency anemia, unspecified: Secondary | ICD-10-CM | POA: Diagnosis not present

## 2017-03-02 DIAGNOSIS — D631 Anemia in chronic kidney disease: Secondary | ICD-10-CM | POA: Diagnosis not present

## 2017-03-02 DIAGNOSIS — N2581 Secondary hyperparathyroidism of renal origin: Secondary | ICD-10-CM | POA: Diagnosis not present

## 2017-03-02 DIAGNOSIS — N186 End stage renal disease: Secondary | ICD-10-CM | POA: Diagnosis not present

## 2017-03-02 DIAGNOSIS — Z992 Dependence on renal dialysis: Secondary | ICD-10-CM | POA: Diagnosis not present

## 2017-03-05 DIAGNOSIS — N2581 Secondary hyperparathyroidism of renal origin: Secondary | ICD-10-CM | POA: Diagnosis not present

## 2017-03-05 DIAGNOSIS — D509 Iron deficiency anemia, unspecified: Secondary | ICD-10-CM | POA: Diagnosis not present

## 2017-03-05 DIAGNOSIS — N186 End stage renal disease: Secondary | ICD-10-CM | POA: Diagnosis not present

## 2017-03-05 DIAGNOSIS — Z992 Dependence on renal dialysis: Secondary | ICD-10-CM | POA: Diagnosis not present

## 2017-03-05 DIAGNOSIS — D631 Anemia in chronic kidney disease: Secondary | ICD-10-CM | POA: Diagnosis not present

## 2017-03-07 DIAGNOSIS — D631 Anemia in chronic kidney disease: Secondary | ICD-10-CM | POA: Diagnosis not present

## 2017-03-07 DIAGNOSIS — I2510 Atherosclerotic heart disease of native coronary artery without angina pectoris: Secondary | ICD-10-CM | POA: Diagnosis not present

## 2017-03-07 DIAGNOSIS — N2581 Secondary hyperparathyroidism of renal origin: Secondary | ICD-10-CM | POA: Diagnosis not present

## 2017-03-07 DIAGNOSIS — D509 Iron deficiency anemia, unspecified: Secondary | ICD-10-CM | POA: Diagnosis not present

## 2017-03-07 DIAGNOSIS — N186 End stage renal disease: Secondary | ICD-10-CM | POA: Diagnosis not present

## 2017-03-07 DIAGNOSIS — I129 Hypertensive chronic kidney disease with stage 1 through stage 4 chronic kidney disease, or unspecified chronic kidney disease: Secondary | ICD-10-CM | POA: Diagnosis not present

## 2017-03-07 DIAGNOSIS — Z992 Dependence on renal dialysis: Secondary | ICD-10-CM | POA: Diagnosis not present

## 2017-03-09 DIAGNOSIS — N2581 Secondary hyperparathyroidism of renal origin: Secondary | ICD-10-CM | POA: Diagnosis not present

## 2017-03-09 DIAGNOSIS — N186 End stage renal disease: Secondary | ICD-10-CM | POA: Diagnosis not present

## 2017-03-09 DIAGNOSIS — Z992 Dependence on renal dialysis: Secondary | ICD-10-CM | POA: Diagnosis not present

## 2017-03-09 DIAGNOSIS — D631 Anemia in chronic kidney disease: Secondary | ICD-10-CM | POA: Diagnosis not present

## 2017-03-09 DIAGNOSIS — D509 Iron deficiency anemia, unspecified: Secondary | ICD-10-CM | POA: Diagnosis not present

## 2017-03-12 DIAGNOSIS — B957 Other staphylococcus as the cause of diseases classified elsewhere: Secondary | ICD-10-CM | POA: Diagnosis not present

## 2017-03-12 DIAGNOSIS — D631 Anemia in chronic kidney disease: Secondary | ICD-10-CM | POA: Diagnosis not present

## 2017-03-12 DIAGNOSIS — N186 End stage renal disease: Secondary | ICD-10-CM | POA: Diagnosis not present

## 2017-03-12 DIAGNOSIS — Z992 Dependence on renal dialysis: Secondary | ICD-10-CM | POA: Diagnosis not present

## 2017-03-12 DIAGNOSIS — Z23 Encounter for immunization: Secondary | ICD-10-CM | POA: Diagnosis not present

## 2017-03-12 DIAGNOSIS — R7881 Bacteremia: Secondary | ICD-10-CM | POA: Diagnosis not present

## 2017-03-12 DIAGNOSIS — Z5181 Encounter for therapeutic drug level monitoring: Secondary | ICD-10-CM | POA: Diagnosis not present

## 2017-03-12 DIAGNOSIS — D509 Iron deficiency anemia, unspecified: Secondary | ICD-10-CM | POA: Diagnosis not present

## 2017-03-12 DIAGNOSIS — Z1611 Resistance to penicillins: Secondary | ICD-10-CM | POA: Diagnosis not present

## 2017-03-12 DIAGNOSIS — N2581 Secondary hyperparathyroidism of renal origin: Secondary | ICD-10-CM | POA: Diagnosis not present

## 2017-03-14 DIAGNOSIS — R7881 Bacteremia: Secondary | ICD-10-CM | POA: Diagnosis not present

## 2017-03-14 DIAGNOSIS — N186 End stage renal disease: Secondary | ICD-10-CM | POA: Diagnosis not present

## 2017-03-14 DIAGNOSIS — Z23 Encounter for immunization: Secondary | ICD-10-CM | POA: Diagnosis not present

## 2017-03-14 DIAGNOSIS — Z5181 Encounter for therapeutic drug level monitoring: Secondary | ICD-10-CM | POA: Diagnosis not present

## 2017-03-14 DIAGNOSIS — D631 Anemia in chronic kidney disease: Secondary | ICD-10-CM | POA: Diagnosis not present

## 2017-03-14 DIAGNOSIS — D509 Iron deficiency anemia, unspecified: Secondary | ICD-10-CM | POA: Diagnosis not present

## 2017-03-16 DIAGNOSIS — R7881 Bacteremia: Secondary | ICD-10-CM | POA: Diagnosis not present

## 2017-03-16 DIAGNOSIS — Z23 Encounter for immunization: Secondary | ICD-10-CM | POA: Diagnosis not present

## 2017-03-16 DIAGNOSIS — D509 Iron deficiency anemia, unspecified: Secondary | ICD-10-CM | POA: Diagnosis not present

## 2017-03-16 DIAGNOSIS — Z5181 Encounter for therapeutic drug level monitoring: Secondary | ICD-10-CM | POA: Diagnosis not present

## 2017-03-16 DIAGNOSIS — N186 End stage renal disease: Secondary | ICD-10-CM | POA: Diagnosis not present

## 2017-03-16 DIAGNOSIS — D631 Anemia in chronic kidney disease: Secondary | ICD-10-CM | POA: Diagnosis not present

## 2017-03-19 DIAGNOSIS — R7881 Bacteremia: Secondary | ICD-10-CM | POA: Diagnosis not present

## 2017-03-19 DIAGNOSIS — Z23 Encounter for immunization: Secondary | ICD-10-CM | POA: Diagnosis not present

## 2017-03-19 DIAGNOSIS — D631 Anemia in chronic kidney disease: Secondary | ICD-10-CM | POA: Diagnosis not present

## 2017-03-19 DIAGNOSIS — D509 Iron deficiency anemia, unspecified: Secondary | ICD-10-CM | POA: Diagnosis not present

## 2017-03-19 DIAGNOSIS — N186 End stage renal disease: Secondary | ICD-10-CM | POA: Diagnosis not present

## 2017-03-19 DIAGNOSIS — Z5181 Encounter for therapeutic drug level monitoring: Secondary | ICD-10-CM | POA: Diagnosis not present

## 2017-03-19 DIAGNOSIS — Z992 Dependence on renal dialysis: Secondary | ICD-10-CM | POA: Diagnosis not present

## 2017-03-20 ENCOUNTER — Emergency Department: Payer: Medicare Other

## 2017-03-20 ENCOUNTER — Encounter: Payer: Self-pay | Admitting: *Deleted

## 2017-03-20 ENCOUNTER — Inpatient Hospital Stay
Admission: EM | Admit: 2017-03-20 | Discharge: 2017-03-22 | DRG: 871 | Disposition: A | Discharge status: Home / Self Care | Payer: Medicare Other | Attending: Internal Medicine | Admitting: Internal Medicine

## 2017-03-20 DIAGNOSIS — Z8249 Family history of ischemic heart disease and other diseases of the circulatory system: Secondary | ICD-10-CM | POA: Diagnosis not present

## 2017-03-20 DIAGNOSIS — Z992 Dependence on renal dialysis: Secondary | ICD-10-CM

## 2017-03-20 DIAGNOSIS — E1122 Type 2 diabetes mellitus with diabetic chronic kidney disease: Secondary | ICD-10-CM | POA: Diagnosis present

## 2017-03-20 DIAGNOSIS — D631 Anemia in chronic kidney disease: Secondary | ICD-10-CM | POA: Diagnosis present

## 2017-03-20 DIAGNOSIS — K529 Noninfective gastroenteritis and colitis, unspecified: Secondary | ICD-10-CM | POA: Diagnosis present

## 2017-03-20 DIAGNOSIS — E1143 Type 2 diabetes mellitus with diabetic autonomic (poly)neuropathy: Secondary | ICD-10-CM | POA: Diagnosis present

## 2017-03-20 DIAGNOSIS — K3184 Gastroparesis: Secondary | ICD-10-CM | POA: Diagnosis present

## 2017-03-20 DIAGNOSIS — F17290 Nicotine dependence, other tobacco product, uncomplicated: Secondary | ICD-10-CM | POA: Diagnosis present

## 2017-03-20 DIAGNOSIS — Z79899 Other long term (current) drug therapy: Secondary | ICD-10-CM | POA: Diagnosis not present

## 2017-03-20 DIAGNOSIS — N186 End stage renal disease: Secondary | ICD-10-CM | POA: Diagnosis present

## 2017-03-20 DIAGNOSIS — R509 Fever, unspecified: Secondary | ICD-10-CM | POA: Diagnosis present

## 2017-03-20 DIAGNOSIS — I10 Essential (primary) hypertension: Secondary | ICD-10-CM | POA: Diagnosis not present

## 2017-03-20 DIAGNOSIS — Z85528 Personal history of other malignant neoplasm of kidney: Secondary | ICD-10-CM

## 2017-03-20 DIAGNOSIS — I120 Hypertensive chronic kidney disease with stage 5 chronic kidney disease or end stage renal disease: Secondary | ICD-10-CM | POA: Diagnosis present

## 2017-03-20 DIAGNOSIS — Z905 Acquired absence of kidney: Secondary | ICD-10-CM | POA: Diagnosis not present

## 2017-03-20 DIAGNOSIS — R000 Tachycardia, unspecified: Secondary | ICD-10-CM

## 2017-03-20 DIAGNOSIS — A419 Sepsis, unspecified organism: Secondary | ICD-10-CM

## 2017-03-20 DIAGNOSIS — E119 Type 2 diabetes mellitus without complications: Secondary | ICD-10-CM | POA: Diagnosis not present

## 2017-03-20 DIAGNOSIS — I340 Nonrheumatic mitral (valve) insufficiency: Secondary | ICD-10-CM | POA: Diagnosis not present

## 2017-03-20 DIAGNOSIS — R7881 Bacteremia: Secondary | ICD-10-CM | POA: Diagnosis not present

## 2017-03-20 DIAGNOSIS — A412 Sepsis due to unspecified staphylococcus: Principal | ICD-10-CM | POA: Diagnosis present

## 2017-03-20 DIAGNOSIS — N2581 Secondary hyperparathyroidism of renal origin: Secondary | ICD-10-CM | POA: Diagnosis present

## 2017-03-20 DIAGNOSIS — R0989 Other specified symptoms and signs involving the circulatory and respiratory systems: Secondary | ICD-10-CM | POA: Diagnosis not present

## 2017-03-20 DIAGNOSIS — R197 Diarrhea, unspecified: Secondary | ICD-10-CM

## 2017-03-20 LAB — CBC WITH DIFFERENTIAL/PLATELET
BASOS PCT: 1 %
Basophils Absolute: 0 10*3/uL (ref 0–0.1)
EOS ABS: 0 10*3/uL (ref 0–0.7)
Eosinophils Relative: 0 %
HCT: 36 % — ABNORMAL LOW (ref 40.0–52.0)
HEMOGLOBIN: 12.1 g/dL — AB (ref 13.0–18.0)
LYMPHS ABS: 0.4 10*3/uL — AB (ref 1.0–3.6)
Lymphocytes Relative: 8 %
MCH: 31 pg (ref 26.0–34.0)
MCHC: 33.5 g/dL (ref 32.0–36.0)
MCV: 92.4 fL (ref 80.0–100.0)
Monocytes Absolute: 0.3 10*3/uL (ref 0.2–1.0)
Monocytes Relative: 7 %
NEUTROS PCT: 84 %
Neutro Abs: 4 10*3/uL (ref 1.4–6.5)
Platelets: 95 10*3/uL — ABNORMAL LOW (ref 150–440)
RBC: 3.89 MIL/uL — AB (ref 4.40–5.90)
RDW: 16.1 % — ABNORMAL HIGH (ref 11.5–14.5)
WBC: 4.7 10*3/uL (ref 3.8–10.6)

## 2017-03-20 LAB — BLOOD GAS, VENOUS
Acid-Base Excess: 6.8 mmol/L — ABNORMAL HIGH (ref 0.0–2.0)
Bicarbonate: 32.4 mmol/L — ABNORMAL HIGH (ref 20.0–28.0)
PCO2 VEN: 50 mmHg (ref 44.0–60.0)
PH VEN: 7.42 (ref 7.250–7.430)
Patient temperature: 37

## 2017-03-20 LAB — COMPREHENSIVE METABOLIC PANEL
ALK PHOS: 59 U/L (ref 38–126)
ALT: 13 U/L — ABNORMAL LOW (ref 17–63)
ANION GAP: 14 (ref 5–15)
AST: 23 U/L (ref 15–41)
Albumin: 3.8 g/dL (ref 3.5–5.0)
BUN: 36 mg/dL — ABNORMAL HIGH (ref 6–20)
CALCIUM: 9.2 mg/dL (ref 8.9–10.3)
CO2: 27 mmol/L (ref 22–32)
Chloride: 95 mmol/L — ABNORMAL LOW (ref 101–111)
Creatinine, Ser: 9.45 mg/dL — ABNORMAL HIGH (ref 0.61–1.24)
GFR calc non Af Amer: 5 mL/min — ABNORMAL LOW (ref >60)
GFR, EST AFRICAN AMERICAN: 6 mL/min — AB (ref >60)
Glucose, Bld: 227 mg/dL — ABNORMAL HIGH (ref 65–99)
Potassium: 4.3 mmol/L (ref 3.5–5.1)
SODIUM: 136 mmol/L (ref 135–145)
Total Bilirubin: 1.3 mg/dL — ABNORMAL HIGH (ref 0.3–1.2)
Total Protein: 8.2 g/dL — ABNORMAL HIGH (ref 6.5–8.1)

## 2017-03-20 LAB — PROTIME-INR
INR: 1.43
PROTHROMBIN TIME: 17.3 s — AB (ref 11.4–15.2)

## 2017-03-20 LAB — LACTIC ACID, PLASMA: Lactic Acid, Venous: 1.1 mmol/L (ref 0.5–1.9)

## 2017-03-20 LAB — GLUCOSE, CAPILLARY: Glucose-Capillary: 172 mg/dL — ABNORMAL HIGH (ref 65–99)

## 2017-03-20 MED ORDER — PIPERACILLIN-TAZOBACTAM 3.375 G IVPB 30 MIN
INTRAVENOUS | Status: AC
  Administered 2017-03-20: 3.375 g via INTRAVENOUS
  Filled 2017-03-20: qty 50

## 2017-03-20 MED ORDER — INSULIN ASPART 100 UNIT/ML ~~LOC~~ SOLN: 0.0000 [IU] | Freq: Every day | SUBCUTANEOUS | Status: DC

## 2017-03-20 MED ORDER — SENNOSIDES-DOCUSATE SODIUM 8.6-50 MG PO TABS: 1.0000 | ORAL_TABLET | Freq: Two times a day (BID) | ORAL | Status: DC

## 2017-03-20 MED ORDER — POLYETHYLENE GLYCOL 3350 17 G PO PACK: 17.0000 g | PACK | Freq: Every day | ORAL | Status: DC | PRN

## 2017-03-20 MED ORDER — PIPERACILLIN-TAZOBACTAM 3.375 G IVPB 30 MIN
3.3750 g | Freq: Once | INTRAVENOUS | Status: AC
  Administered 2017-03-20: 3.375 g via INTRAVENOUS

## 2017-03-20 MED ORDER — ACETAMINOPHEN 650 MG RE SUPP: 650.0000 mg | Freq: Four times a day (QID) | RECTAL | Status: DC | PRN

## 2017-03-20 MED ORDER — INSULIN ASPART 100 UNIT/ML ~~LOC~~ SOLN
10.0000 [IU] | Freq: Once | SUBCUTANEOUS | Status: AC
  Administered 2017-03-20: 10 [IU] via INTRAVENOUS
  Filled 2017-03-20: qty 1

## 2017-03-20 MED ORDER — ACETAMINOPHEN 500 MG PO TABS
1000.0000 mg | ORAL_TABLET | Freq: Once | ORAL | Status: AC
  Administered 2017-03-20: 1000 mg via ORAL
  Filled 2017-03-20: qty 2

## 2017-03-20 MED ORDER — INSULIN DETEMIR 100 UNIT/ML ~~LOC~~ SOLN
10.0000 [IU] | Freq: Every day | SUBCUTANEOUS | Status: DC
  Filled 2017-03-20 (×2): qty 0.1

## 2017-03-20 MED ORDER — ACETAMINOPHEN 325 MG PO TABS: 650.0000 mg | ORAL_TABLET | Freq: Four times a day (QID) | ORAL | Status: DC | PRN

## 2017-03-20 MED ORDER — LOSARTAN POTASSIUM 50 MG PO TABS
100.0000 mg | ORAL_TABLET | Freq: Every day | ORAL | Status: DC
  Administered 2017-03-21 – 2017-03-22 (×2): 100 mg via ORAL
  Filled 2017-03-20 (×2): qty 2

## 2017-03-20 MED ORDER — METOPROLOL TARTRATE 5 MG/5ML IV SOLN
10.0000 mg | Freq: Once | INTRAVENOUS | Status: AC
  Administered 2017-03-20: 10 mg via INTRAVENOUS
  Filled 2017-03-20: qty 10

## 2017-03-20 MED ORDER — FERRIC CITRATE 1 GM 210 MG(FE) PO TABS
630.0000 mg | ORAL_TABLET | Freq: Two times a day (BID) | ORAL | Status: DC
  Administered 2017-03-21: 210 mg via ORAL
  Filled 2017-03-20: qty 3

## 2017-03-20 MED ORDER — CARVEDILOL 6.25 MG PO TABS: 6.2500 mg | ORAL_TABLET | Freq: Two times a day (BID) | ORAL | Status: DC

## 2017-03-20 MED ORDER — INSULIN ASPART 100 UNIT/ML ~~LOC~~ SOLN: 0.0000 [IU] | Freq: Three times a day (TID) | SUBCUTANEOUS | Status: DC

## 2017-03-20 MED ORDER — ONDANSETRON HCL 4 MG/2ML IJ SOLN: 4.0000 mg | Freq: Four times a day (QID) | INTRAMUSCULAR | Status: DC | PRN

## 2017-03-20 MED ORDER — VANCOMYCIN HCL 10 G IV SOLR
2000.0000 mg | Freq: Once | INTRAVENOUS | Status: AC
  Administered 2017-03-20: 2000 mg via INTRAVENOUS
  Filled 2017-03-20: qty 2000

## 2017-03-20 MED ORDER — HEPARIN SODIUM (PORCINE) 5000 UNIT/ML IJ SOLN
5000.0000 [IU] | Freq: Three times a day (TID) | INTRAMUSCULAR | Status: DC
  Administered 2017-03-21 – 2017-03-22 (×2): 5000 [IU] via SUBCUTANEOUS
  Filled 2017-03-20 (×2): qty 1

## 2017-03-20 MED ORDER — ONDANSETRON HCL 4 MG PO TABS: 4.0000 mg | ORAL_TABLET | Freq: Four times a day (QID) | ORAL | Status: DC | PRN

## 2017-03-20 MED ORDER — SEVELAMER CARBONATE 800 MG PO TABS
1600.0000 mg | ORAL_TABLET | Freq: Two times a day (BID) | ORAL | Status: DC
  Administered 2017-03-21: 800 mg via ORAL
  Filled 2017-03-20: qty 2

## 2017-03-20 NOTE — ED Notes (Signed)
Pt states he doesn't make urine

## 2017-03-20 NOTE — H&P (Signed)
[place] [facility] [place] NAME: Timothy Gibson    MR#:  [id number]  DATE OF BIRTH:  Feb 11, 1951  DATE OF ADMISSION:  03/20/2017  PRIMARY CARE PHYSICIAN: Timothy Noble, MD   REQUESTING/REFERRING PHYSICIAN: Dr. Eula Listen  CHIEF COMPLAINT:   Chief Complaint  Patient presents with  . Diarrhea  . Emesis  . Generalized Body Aches    HISTORY OF PRESENT ILLNESS:  Timothy Gibson  is a 66 y.o. 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 Diarrhea has been going on for almost 2 weeks now. Loose watery stools without any abdominal pain, nausea or vomiting.denies any eating outside, no recent travel. No exposure to sick contacts. Since last night he felt sick, myalgias unable to get out of bed and feeling weak so presented to the emergency room. He has not checked his temperature at home but was noted to have fever of 102.20F here. He does not make any urine. Chest x-ray did not show any infiltrate. Patient denies any cough, congestion. No other source of infection has been identified so far. Hasn't had a bowel movement here to collect his stool sample.AV fistula on his left forearm appears without any infection. He is being admitted for further management  PAST MEDICAL HISTORY:   Past Medical History:  Diagnosis Date  . Cancer ([place])    right kidney  . Diabetes mellitus without complication ([place])   . Dialysis patient [facility])    on Mon-Wed-Fri dialysis  . ESRD (end stage renal disease) ([place])   . History of gangrene   . Hypertension     PAST SURGICAL HISTORY:   Past Surgical History:  Procedure Laterality Date  . COLONOSCOPY WITH PROPOFOL N/A 12/16/2015   Procedure: COLONOSCOPY WITH PROPOFOL;  Surgeon: Manya Silvas, MD;  Location: [facility] ENDOSCOPY;  Service: Endoscopy;  Laterality:  N/A;  . KNEE SURGERY    . left forearm AV fistula    . NEPHRECTOMY      SOCIAL HISTORY:   Social History  Substance Use Topics  . Smoking status: Current Some Day Smoker  . Smokeless tobacco: Not on file     Comment: cigar smoker  . Alcohol use No    FAMILY HISTORY:   Family History  Problem Relation Age of Onset  . Hypertension Father     DRUG ALLERGIES:  No Known Allergies  REVIEW OF SYSTEMS:   Review of Systems  Constitutional: Positive for chills, fever and malaise/fatigue. Negative for weight loss.  HENT: Negative for ear discharge, ear pain, hearing loss and nosebleeds.   Eyes: Negative for blurred vision, double vision and photophobia.  Respiratory: Negative for cough, hemoptysis, shortness of breath and wheezing.   Cardiovascular: Negative for chest pain, palpitations, orthopnea and leg swelling.  Gastrointestinal: Positive for constipation. Negative for abdominal pain, diarrhea, heartburn, melena, nausea and vomiting.  Genitourinary: Negative for dysuria.       Anuric  Musculoskeletal: Negative for back pain, myalgias and neck pain.  Skin: Negative for rash.  Neurological: Negative for dizziness, sensory change, speech change, focal weakness and headaches.  Endo/Heme/Allergies: Does not bruise/bleed easily.  Psychiatric/Behavioral: Negative for depression.    MEDICATIONS AT HOME:   Prior to Admission medications   Medication Sig Start Date End Date Taking? Authorizing Provider  carvedilol (COREG) 6.25 MG tablet Take 6.25 mg by mouth 2 (two)  times daily. 01/05/17  Yes [provider]  diphenoxylate-atropine (LOMOTIL) 2.5-0.025 MG tablet Take 1 tablet by mouth 3 (three) times daily. 10/06/15  Yes [provider]  Ferric Citrate (AURYXIA) 1 GM 210 MG(Fe) TABS Take 3 tablets by mouth 2 (two) times daily with a meal.   Yes [provider]  LEVEMIR FLEXTOUCH 100 UNIT/ML Pen Inject 6-13 Units into the skin at bedtime.  07/10/15  Yes  [provider]  lidocaine-prilocaine (EMLA) cream Apply 1 application topically every Monday, Wednesday, and Friday. Apply 2 hours before dialysis. 05/23/10  Yes [provider]  losartan (COZAAR) 100 MG tablet Take 100 mg by mouth daily. 01/05/17  Yes [provider]  sevelamer carbonate (RENVELA) 800 MG tablet Take 1,600 mg by mouth 2 (two) times daily with a meal.    Yes [provider]  gabapentin (NEURONTIN) 100 MG capsule One tablet at night for three nights, if still having pain then go up to two tablets every night Patient not taking: Reported on 03/20/2017 09/26/15   Timothy Grayer, MD  HYDROcodone-acetaminophen (NORCO/VICODIN) 5-325 MG tablet Take 1 tablet by mouth every 4 (four) hours as needed for moderate pain. Patient not taking: Reported on 03/20/2017 09/26/15   Timothy Grayer, MD  metoprolol tartrate (LOPRESSOR) 25 MG tablet Take 1 tablet (25 mg total) by mouth 2 (two) times daily. Patient not taking: Reported on 03/20/2017 09/26/15   Timothy Grayer, MD  ondansetron (ZOFRAN ODT) 4 MG disintegrating tablet Take 1 tablet (4 mg total) by mouth every 6 (six) hours as needed for nausea or vomiting. Patient not taking: Reported on 03/20/2017 10/09/15   Timothy Kitten, MD      VITAL SIGNS:  Blood pressure (!) 150/87, pulse 96, temperature (!) 101.7 F (38.7 C), temperature source Oral, resp. rate (!) 22, height 6\' 4"  (1.93 m), weight 120.2 kg (265 lb), SpO2 94 %.  PHYSICAL EXAMINATION:   Physical Exam  GENERAL:  66 y.o.-year-old patient lying in the bed with no acute distress.  EYES: Pupils equal, round, reactive to light and accommodation. No scleral icterus. Extraocular muscles intact.  HEENT: Head atraumatic, normocephalic. Oropharynx and nasopharynx clear.  NECK:  Supple, no jugular venous distention. No thyroid enlargement, no tenderness.  LUNGS: Normal breath sounds bilaterally, no wheezing, rales,rhonchi or crepitation. No use of accessory  muscles of respiration. Decreased bibasilar breath sounds CARDIOVASCULAR: S1, S2 normal. No murmurs, rubs, or gallops.  ABDOMEN: Soft, nontender, nondistended. Bowel sounds present. No organomegaly or mass.  EXTREMITIES: No pedal edema, cyanosis, or clubbing. Left fore arm fistula with good thrill- no erythema, swelling or tenderness around it. NEUROLOGIC: Cranial nerves II through XII are intact. Muscle strength 5/5 in all extremities. Sensation intact. Gait not checked.  PSYCHIATRIC: The patient is alert and oriented x 3.  SKIN: No obvious rash, lesion, or ulcer.   LABORATORY PANEL:   CBC  Recent Labs Lab 03/20/17 1709  WBC 4.7  HGB 12.1*  HCT 36.0*  PLT 95*   ------------------------------------------------------------------------------------------------------------------  Chemistries   Recent Labs Lab 03/20/17 1709  NA 136  K 4.3  CL 95*  CO2 27  GLUCOSE 227*  BUN 36*  CREATININE 9.45*  CALCIUM 9.2  AST 23  ALT 13*  ALKPHOS 59  BILITOT 1.3*   ------------------------------------------------------------------------------------------------------------------  Cardiac Enzymes No results for input(s): TROPONINI in the last 168 hours. ------------------------------------------------------------------------------------------------------------------  RADIOLOGY:  Dg Chest 2 View  Result Date: 03/20/2017 CLINICAL DATA:  Sepsis.  Dialysis patient EXAM: CHEST  2 VIEW COMPARISON:  09/25/2015 FINDINGS: Cardiac enlargement with vascular congestion. Negative for edema or effusion. Negative for pneumonia IMPRESSION: Pulmonary vascular congestion without edema Electronically Signed   By: Franchot Gallo M.D.   On: 03/20/2017 18:04    EKG:   Orders placed or performed during the hospital encounter of 03/20/17  . EKG 12-Lead  . EKG 12-Lead  . ED EKG 12-Lead  . ED EKG 12-Lead    IMPRESSION AND PLAN:   Timothy Gibson  is a 66 y.o. male with a known history of renal cell  carcinoma status post right nephrectomy, ESRD on Monday Wednesday Friday hemodialysis, diabetes, hypertension presents to hospital secondary to fevers and diarrhea.  #1 Fever- unknown source, could be viral - however had diarrhea- if still having- please send for stool studies- no abdominal pain,. Hold off on CT abd at this time - blood cultures drawn. -monitored. Chest x-ray without any infection.Normal WBC  #2 end-stage renal disease-on Monday Wednesday Friday dialysis. -Status post right nephrectomy after renal cell cancer. -Nephrology consulted for the same  #3 diabetes mellitus-restart his insulin. Also sliding scale insulin.  #4 hypertension-coreg and losartan  #5 DVT prophylaxis with subcutaneous heparin    All the records are reviewed and case discussed with ED provider. Management plans discussed with the patient, family and they are in agreement.  CODE STATUS: Full Code  TOTAL TIME TAKING CARE OF THIS PATIENT: 50 minutes.    Timothy Gibson M.D on 03/20/2017 at 9:27 PM  Between 7am to 6pm - Pager - [phone]  After 6pm go to [email or web address] - password [email or web address] [place]  Office  [phone]  CC: Primary care physician; Timothy Noble, MD

## 2017-03-20 NOTE — ED Notes (Signed)
CODE SEPSIS CALLED TO [facility] AT [facility] 

## 2017-03-20 NOTE — ED Provider Notes (Addendum)
[facility] Emergency Department Provider Note  ____________________________________________  Time seen: Approximately 6:06 PM  I have reviewed the triage vital signs and the nursing notes.   HISTORY  Chief Complaint Diarrhea; Emesis; and Generalized Body Aches    HPI Timothy Gibson is a 66 y.o. male with a history of ESRD on HD, renal carcinoma, DM, HTN, presenting with fever and diarrhea.The patient reports that for the past 2 weeks, he has had 5-6 daily episodes of loose stool that is nonbloody. He has not had any associated nausea, vomiting, loss of appetite, or abdominal pain. He does not produce urine. Today, the patient was noted to have a fever to 102.9. He denies any erythema, swelling or pain around his left upper extremity fistula site. No cough or cold symptoms, sore throat, chest pain or shortness of breath. No known sick contacts or travel out of the [place], recent tick bites.   Past Medical History:  Diagnosis Date  . Cancer ([place])    right kidney  . Diabetes mellitus without complication ([place])   . Dialysis patient ([place])   . ESRD (end stage renal disease) ([place])   . History of gangrene   . Hypertension     Patient Active Problem List   Diagnosis Date Noted  . Chest pain 09/25/2015    Past Surgical History:  Procedure Laterality Date  . COLONOSCOPY WITH PROPOFOL N/A 12/16/2015   Procedure: COLONOSCOPY WITH PROPOFOL;  Surgeon: Manya Silvas, MD;  Location: [facility] ENDOSCOPY;  Service: Endoscopy;  Laterality: N/A;  . KNEE SURGERY    . NEPHRECTOMY      Current Outpatient Rx  . Order #: [id number] Class: Historical Med  . Order #: [id number] Class: Historical Med  . Order #: [id number] Class: Historical Med  . Order #: [id number] Class: Print  . Order #: [id number] Class: Print  . Order #: [id number] Class: Historical Med  . Order #: [id number] Class: Historical Med  . Order #: [id number] Class: Print  . Order #: [id number] Class: Print  .  Order #: [id number] Class: Historical Med    Allergies Patient has no known allergies.  History reviewed. No pertinent family history.  Social History Social History  Substance Use Topics  . Smoking status: Current Some Day Smoker  . Smokeless tobacco: Not on file  . Alcohol use No    Review of Systems Constitutional: Positive fever. The chest. Eyes: No visual changes. No eye discharge. ENT: No sore throat. No congestion or rhinorrhea. No ear pain. Cardiovascular: Denies chest pain. Denies palpitations. Respiratory: Denies shortness of breath.  No cough. Gastrointestinal: No abdominal pain.  No nausea, no vomiting.  Positive diarrhea.  No constipation. Genitourinary: No longer makes urine. Musculoskeletal: Negative for back pain. No joint pain or swelling. Skin: Negative for rash. Neurological: Negative for headaches. No focal numbness, tingling or weakness.     ____________________________________________   PHYSICAL EXAM:  VITAL SIGNS: ED Triage Vitals  Enc Vitals Group     BP 03/20/17 1703 (!) 202/99     Pulse Rate 03/20/17 1703 (!) 104     Resp 03/20/17 1703 (!) 24     Temp 03/20/17 1703 (!) 102.9 F (39.4 C)     Temp Source 03/20/17 1703 Oral     SpO2 03/20/17 1703 94 %     Weight 03/20/17 1709 265 lb (120.2 kg)     Height 03/20/17 1709 6\' 4"  (1.93 m)     Head Circumference --      Peak Flow --  Pain Score 03/20/17 1709 0     Pain Loc --      Pain Edu? --      Excl. in [place]? --     Constitutional: Alert and oriented. Chronically ill appearing and in no acute distress. Answers questions appropriately. Eyes: Conjunctivae are normal.  EOMI. No scleral icterus. No eye discharge. Head: Atraumatic. Nose: No congestion/rhinnorhea. Mouth/Throat: Mucous membranes are moist.  Neck: No stridor.  Supple.  No JVD. No meningismus. Cardiovascular: Normal rate, regular rhythm. No murmurs, rubs or gallops.  Respiratory: Normal respiratory effort.  No accessory muscle  use or retractions. Lungs CTAB.  No wheezes, rales or ronchi. Gastrointestinal: Obese. Soft, nontender and nondistended.  No guarding or rebound.  No peritoneal signs. Musculoskeletal: Positive symmetric bilateral lower extremity edema to the mid tibial shaft. No swollen or erythematous joints. Left upper extremity fistula with good thrill.  Neurologic:  A&Ox3.  Speech is clear.  Face and smile are symmetric.  EOMI.  Moves all extremities well. Skin:  Skin is warm, dry and intact. No rash noted. Psychiatric: Mood and affect are normal. Speech and behavior are normal.  Normal judgement.  ____________________________________________   LABS (all labs ordered are listed, but only abnormal results are displayed)  Labs Reviewed  COMPREHENSIVE METABOLIC PANEL - Abnormal; Notable for the following:       Result Value   Chloride 95 (*)    Glucose, Bld 227 (*)    BUN 36 (*)    Creatinine, Ser 9.45 (*)    Total Protein 8.2 (*)    ALT 13 (*)    Total Bilirubin 1.3 (*)    GFR calc non Af Amer 5 (*)    GFR calc Af Amer 6 (*)    All other components within normal limits  CBC WITH DIFFERENTIAL/PLATELET - Abnormal; Notable for the following:    RBC 3.89 (*)    Hemoglobin 12.1 (*)    HCT 36.0 (*)    RDW 16.1 (*)    Platelets 95 (*)    Lymphs Abs 0.4 (*)    All other components within normal limits  PROTIME-INR - Abnormal; Notable for the following:    Prothrombin Time 17.3 (*)    All other components within normal limits  CULTURE, BLOOD (ROUTINE X 2)  CULTURE, BLOOD (ROUTINE X 2)  C DIFFICILE QUICK SCREEN W PCR REFLEX  GASTROINTESTINAL PANEL BY PCR, STOOL (REPLACES STOOL CULTURE)  LACTIC ACID, PLASMA  LACTIC ACID, PLASMA  URINALYSIS, COMPLETE (UACMP) WITH MICROSCOPIC  BLOOD GAS, VENOUS   ____________________________________________  EKG  ED ECG REPORT I, Eula Listen, the attending physician, personally viewed and interpreted this ECG.   Date: 03/20/2017  EKG Time:  1716  Rate: 106  Rhythm: sinus tachycardia  Axis: leftward  Intervals:none  ST&T Change: No STEMI  ____________________________________________  RADIOLOGY  Dg Chest 2 View  Result Date: 03/20/2017 CLINICAL DATA:  Sepsis.  Dialysis patient EXAM: CHEST  2 VIEW COMPARISON:  09/25/2015 FINDINGS: Cardiac enlargement with vascular congestion. Negative for edema or effusion. Negative for pneumonia IMPRESSION: Pulmonary vascular congestion without edema Electronically Signed   By: Franchot Gallo M.D.   On: 03/20/2017 18:04    ____________________________________________   PROCEDURES  Procedure(s) performed: None  Procedures  Critical Care performed: Yes, see critical care note(s) ____________________________________________   INITIAL IMPRESSION / ASSESSMENT AND PLAN / ED COURSE  Pertinent labs & imaging results that were available during my care of the patient were reviewed by me and considered in my  medical decision making (see chart for details).  66 y.o. male with a history of ESRD on HD, presenting with fever and diarrhea. Overall, the patient is hypertensive, tachycardic and febrile. I'll treat him an antipyretic, send his stool for stool studies. The patient will receive empiric antibiotics to treat for sepsis. Diarrhea as a possible cause for his fever, but given his fistula, and dialysis, I am concerned about bacteremia. I do not hear a murmur that would be concerning for endocarditis, but this is also on the differential. Do not see any evidence of infections. He does not have abdominal examination be consistent with an acute intra-abdominal pathology requiring CT imaging at this time.  The patient is hyperglycemic without DKA so I will treat him with insulin. Plan admission at this time.  CRITICAL CARE Performed by: Eula Listen   Total critical care time: 35 minutes  Critical care time was exclusive of separately billable procedures and treating other  patients.  Critical care was necessary to treat or prevent imminent or life-threatening deterioration.  Critical care was time spent personally by me on the following activities: development of treatment plan with patient and/or surrogate as well as nursing, discussions with consultants, evaluation of patient's response to treatment, examination of patient, obtaining history from patient or surrogate, ordering and performing treatments and interventions, ordering and review of laboratory studies, ordering and review of radiographic studies, pulse oximetry and re-evaluation of patient's condition.   ____________________________________________  FINAL CLINICAL IMPRESSION(S) / ED DIAGNOSES  Final diagnoses:  Sepsis, due to unspecified organism ([place])  Diarrhea, unspecified type  Hypertension, unspecified type  Sinus tachycardia         NEW MEDICATIONS STARTED DURING THIS VISIT:  New Prescriptions   No medications on file      Eula Listen, MD 03/20/17 1817    Eula Listen, MD 03/20/17 1820    Eula Listen, MD 03/20/17 1909

## 2017-03-20 NOTE — ED Notes (Signed)
Admitting hospitalist @ BS. CBG checked.

## 2017-03-20 NOTE — ED Notes (Signed)
Apolonio Schneiders RN, aware of assigned bed

## 2017-03-20 NOTE — ED Triage Notes (Signed)
dialysis pt MWF, states nausea and body aches with diarrhea that began yesterday

## 2017-03-20 NOTE — ED Notes (Signed)
Lab called to add on orders at this time

## 2017-03-21 LAB — BLOOD CULTURE ID PANEL (REFLEXED)
ACINETOBACTER BAUMANNII: NOT DETECTED
CANDIDA ALBICANS: NOT DETECTED
CANDIDA PARAPSILOSIS: NOT DETECTED
CANDIDA TROPICALIS: NOT DETECTED
Candida glabrata: NOT DETECTED
Candida krusei: NOT DETECTED
Enterobacter cloacae complex: NOT DETECTED
Enterobacteriaceae species: NOT DETECTED
Enterococcus species: NOT DETECTED
Escherichia coli: NOT DETECTED
HAEMOPHILUS INFLUENZAE: NOT DETECTED
KLEBSIELLA OXYTOCA: NOT DETECTED
KLEBSIELLA PNEUMONIAE: NOT DETECTED
Listeria monocytogenes: NOT DETECTED
METHICILLIN RESISTANCE: DETECTED — AB
NEISSERIA MENINGITIDIS: NOT DETECTED
Proteus species: NOT DETECTED
Pseudomonas aeruginosa: NOT DETECTED
SERRATIA MARCESCENS: NOT DETECTED
STAPHYLOCOCCUS AUREUS BCID: NOT DETECTED
STREPTOCOCCUS PYOGENES: NOT DETECTED
STREPTOCOCCUS SPECIES: NOT DETECTED
Staphylococcus species: DETECTED — AB
Streptococcus agalactiae: NOT DETECTED
Streptococcus pneumoniae: NOT DETECTED

## 2017-03-21 LAB — HEMOGLOBIN A1C
HEMOGLOBIN A1C: 8.2 % — AB (ref 4.8–5.6)
Mean Plasma Glucose: 188.64 mg/dL

## 2017-03-21 LAB — BASIC METABOLIC PANEL
Anion gap: 12 (ref 5–15)
BUN: 43 mg/dL — ABNORMAL HIGH (ref 6–20)
CHLORIDE: 97 mmol/L — AB (ref 101–111)
CO2: 28 mmol/L (ref 22–32)
Calcium: 8.7 mg/dL — ABNORMAL LOW (ref 8.9–10.3)
Creatinine, Ser: 10.66 mg/dL — ABNORMAL HIGH (ref 0.61–1.24)
GFR calc Af Amer: 5 mL/min — ABNORMAL LOW (ref >60)
GFR calc non Af Amer: 4 mL/min — ABNORMAL LOW (ref >60)
GLUCOSE: 240 mg/dL — AB (ref 65–99)
POTASSIUM: 3.8 mmol/L (ref 3.5–5.1)
Sodium: 137 mmol/L (ref 135–145)

## 2017-03-21 LAB — GLUCOSE, CAPILLARY
GLUCOSE-CAPILLARY: 236 mg/dL — AB (ref 65–99)
Glucose-Capillary: 236 mg/dL — ABNORMAL HIGH (ref 65–99)
Glucose-Capillary: 208 mg/dL — ABNORMAL HIGH (ref 65–99)

## 2017-03-21 LAB — PHOSPHORUS: PHOSPHORUS: 5.1 mg/dL — AB (ref 2.5–4.6)

## 2017-03-21 LAB — CBC
HEMATOCRIT: 33.2 % — AB (ref 40.0–52.0)
Hemoglobin: 11.2 g/dL — ABNORMAL LOW (ref 13.0–18.0)
MCH: 31.3 pg (ref 26.0–34.0)
MCHC: 33.9 g/dL (ref 32.0–36.0)
MCV: 92.5 fL (ref 80.0–100.0)
Platelets: 82 10*3/uL — ABNORMAL LOW (ref 150–440)
RBC: 3.59 MIL/uL — ABNORMAL LOW (ref 4.40–5.90)
RDW: 16.6 % — ABNORMAL HIGH (ref 11.5–14.5)
WBC: 3.7 10*3/uL — ABNORMAL LOW (ref 3.8–10.6)

## 2017-03-21 LAB — INFLUENZA PANEL BY PCR (TYPE A & B)
INFLBPCR: NEGATIVE
Influenza A By PCR: NEGATIVE

## 2017-03-21 LAB — MRSA PCR SCREENING: MRSA BY PCR: NEGATIVE

## 2017-03-21 MED ORDER — METRONIDAZOLE 500 MG PO TABS
500.0000 mg | ORAL_TABLET | Freq: Three times a day (TID) | ORAL | Status: DC
  Administered 2017-03-21: 500 mg via ORAL
  Filled 2017-03-21 (×2): qty 1

## 2017-03-21 MED ORDER — HYDRALAZINE HCL 20 MG/ML IJ SOLN
10.0000 mg | Freq: Four times a day (QID) | INTRAMUSCULAR | Status: DC | PRN
  Administered 2017-03-21: 10 mg via INTRAVENOUS
  Filled 2017-03-21: qty 1

## 2017-03-21 MED ORDER — PIPERACILLIN-TAZOBACTAM 3.375 G IVPB
3.3750 g | Freq: Two times a day (BID) | INTRAVENOUS | Status: DC
  Administered 2017-03-21 – 2017-03-22 (×2): 3.375 g via INTRAVENOUS
  Filled 2017-03-21 (×2): qty 50

## 2017-03-21 MED ORDER — VANCOMYCIN HCL IN DEXTROSE 1-5 GM/200ML-% IV SOLN
1000.0000 mg | INTRAVENOUS | Status: DC
Start: 2017-03-21 — End: 2017-03-22
  Filled 2017-03-21: qty 200

## 2017-03-21 MED ORDER — EPOETIN ALFA 10000 UNIT/ML IJ SOLN
4000.0000 [IU] | Freq: Once | INTRAMUSCULAR | Status: AC
  Administered 2017-03-21: 4000 [IU] via INTRAVENOUS

## 2017-03-21 MED ORDER — METOCLOPRAMIDE HCL 5 MG PO TABS: 5.0000 mg | ORAL_TABLET | Freq: Three times a day (TID) | ORAL | Status: DC

## 2017-03-21 MED ORDER — CARVEDILOL 12.5 MG PO TABS
12.5000 mg | ORAL_TABLET | Freq: Two times a day (BID) | ORAL | Status: DC
  Administered 2017-03-21 – 2017-03-22 (×2): 12.5 mg via ORAL
  Filled 2017-03-21 (×2): qty 1

## 2017-03-21 MED ORDER — INSULIN DETEMIR 100 UNIT/ML ~~LOC~~ SOLN
12.0000 [IU] | Freq: Every day | SUBCUTANEOUS | Status: DC
  Filled 2017-03-21 (×2): qty 0.12

## 2017-03-21 NOTE — Progress Notes (Addendum)
PHARMACY - PHYSICIAN COMMUNICATION CRITICAL VALUE ALERT - BLOOD CULTURE IDENTIFICATION (BCID)  Results for orders placed or performed during the hospital encounter of 03/20/17  Blood Culture ID Panel (Reflexed) (Collected: 03/20/2017  5:22 PM)  Result Value Ref Range   Enterococcus species NOT DETECTED NOT DETECTED   Listeria monocytogenes NOT DETECTED NOT DETECTED   Staphylococcus species DETECTED (A) NOT DETECTED   Staphylococcus aureus NOT DETECTED NOT DETECTED   Methicillin resistance DETECTED (A) NOT DETECTED   Streptococcus species NOT DETECTED NOT DETECTED   Streptococcus agalactiae NOT DETECTED NOT DETECTED   Streptococcus pneumoniae NOT DETECTED NOT DETECTED   Streptococcus pyogenes NOT DETECTED NOT DETECTED   Acinetobacter baumannii NOT DETECTED NOT DETECTED   Enterobacteriaceae species NOT DETECTED NOT DETECTED   Enterobacter cloacae complex NOT DETECTED NOT DETECTED   Escherichia coli NOT DETECTED NOT DETECTED   Klebsiella oxytoca NOT DETECTED NOT DETECTED   Klebsiella pneumoniae NOT DETECTED NOT DETECTED   Proteus species NOT DETECTED NOT DETECTED   Serratia marcescens NOT DETECTED NOT DETECTED   Haemophilus influenzae NOT DETECTED NOT DETECTED   Neisseria meningitidis NOT DETECTED NOT DETECTED   Pseudomonas aeruginosa NOT DETECTED NOT DETECTED   Candida albicans NOT DETECTED NOT DETECTED   Candida glabrata NOT DETECTED NOT DETECTED   Candida krusei NOT DETECTED NOT DETECTED   Candida parapsilosis NOT DETECTED NOT DETECTED   Candida tropicalis NOT DETECTED NOT DETECTED    Name of physician (or Provider) Contacted: Dr. Bridgett Larsson  Changes to prescribed antibiotics required: Add vancomycin and [place], Pharm.D., BCPS Clinical Pharmacist 03/21/2017  1:25 PM    03/21/17 14:13 - lab calls to report second set of cultures positive for GPC staph species mecA detected. Now all 4 bottles positive for GPC staph species mecA detected. -[facility]

## 2017-03-21 NOTE — Progress Notes (Signed)
  End of hd 

## 2017-03-21 NOTE — Progress Notes (Addendum)
[facility] [place] NAME: Timothy Gibson    MR#:  [id number]  DATE OF BIRTH:  06-Dec-1950  SUBJECTIVE:  CHIEF COMPLAINT:   Chief Complaint  Patient presents with  . Diarrhea  . Emesis  . Generalized Body Aches   Has fever but No diarrhea this morning. REVIEW OF SYSTEMS:  Review of Systems  Constitutional: Positive for fever. Negative for chills and malaise/fatigue.  HENT: Negative for sore throat.   Eyes: Negative for blurred vision and double vision.  Respiratory: Negative for cough, hemoptysis, shortness of breath, wheezing and stridor.   Cardiovascular: Negative for chest pain, palpitations, orthopnea and leg swelling.  Gastrointestinal: Negative for abdominal pain, blood in stool, diarrhea, melena, nausea and vomiting.  Genitourinary: Negative for dysuria, flank pain and hematuria.  Musculoskeletal: Negative for back pain and joint pain.  Skin: Negative for rash.  Neurological: Negative for dizziness, sensory change, focal weakness, seizures, loss of consciousness, weakness and headaches.  Endo/Heme/Allergies: Negative for polydipsia.  Psychiatric/Behavioral: Negative for depression. The patient is not nervous/anxious.     DRUG ALLERGIES:  No Known Allergies VITALS:  Blood pressure (!) 185/96, pulse (!) 102, temperature 98.5 F (36.9 C), temperature source Oral, resp. rate 16, height 6\' 4"  (1.93 m), weight 265 lb (120.2 kg), SpO2 99 %. PHYSICAL EXAMINATION:  Physical Exam  Constitutional: He is oriented to person, place, and time and well-developed, well-nourished, and in no distress.  HENT:  Head: Normocephalic.  Mouth/Throat: Oropharynx is clear and moist.  Eyes: Pupils are equal, round, and reactive to light. Conjunctivae and EOM are normal. No scleral icterus.  Neck: Normal range of motion. Neck supple. No JVD present. No tracheal deviation present.  Cardiovascular: Normal rate, regular rhythm and normal heart sounds.   Exam reveals no gallop.   No murmur heard. Pulmonary/Chest: Effort normal and breath sounds normal. No respiratory distress. He has no wheezes. He has no rales.  Abdominal: Soft. Bowel sounds are normal. He exhibits no distension. There is no tenderness. There is no rebound.  Musculoskeletal: Normal range of motion. He exhibits no edema or tenderness.  Neurological: He is alert and oriented to person, place, and time. No cranial nerve deficit.  Skin: No rash noted. No erythema.  Psychiatric: Affect normal.   LABORATORY PANEL:  Male CBC  Recent Labs Lab 03/21/17 0510  WBC 3.7*  HGB 11.2*  HCT 33.2*  PLT 82*   ------------------------------------------------------------------------------------------------------------------ Chemistries   Recent Labs Lab 03/20/17 1709 03/21/17 0510  NA 136 137  K 4.3 3.8  CL 95* 97*  CO2 27 28  GLUCOSE 227* 240*  BUN 36* 43*  CREATININE 9.45* 10.66*  CALCIUM 9.2 8.7*  AST 23  --   ALT 13*  --   ALKPHOS 59  --   BILITOT 1.3*  --    RADIOLOGY:  Dg Chest 2 View  Result Date: 03/20/2017 CLINICAL DATA:  Sepsis.  Dialysis patient EXAM: CHEST  2 VIEW COMPARISON:  09/25/2015 FINDINGS: Cardiac enlargement with vascular congestion. Negative for edema or effusion. Negative for pneumonia IMPRESSION: Pulmonary vascular congestion without edema Electronically Signed   By: Franchot Gallo M.D.   On: 03/20/2017 18:04   ASSESSMENT AND PLAN:   Timothy Gibson  is a 66 y.o. 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  #1 Fever is positive GPC blood culture. The patient  was treated with vancomycin and Zosyn 1 dose. Continue pharmacy to dose. Follow-up blood culture final report and ID consult.  Diarrhea. No diarrhea this morning. Pending stool C. Difficile test and GI panel. Start Flagyl.  #2  end-stage renal disease-on Monday Wednesday Friday dialysis. -Status post right nephrectomy after renal cell cancer.  #3 diabetes mellitus Continue Levemir and sliding scale insulin.  #4 accelerated hypertension And hydralazine IV when necessary, increased coreg and continue losartan  All the records are reviewed and case discussed with Care Management/Social Worker. Management plans discussed with the patient, family and they are in agreement.  CODE STATUS: Full Code  TOTAL TIME TAKING CARE OF THIS PATIENT: 36 minutes.   More than 50% of the time was spent in counseling/coordination of care: YES  POSSIBLE D/C IN 2-3 DAYS, DEPENDING ON CLINICAL CONDITION.   Timothy Gibson M.D on 03/21/2017 at 1:23 PM  Between 7am to 6pm - Pager - [phone]  After 6pm go to [email or web address] - [employer] [facility]

## 2017-03-21 NOTE — Progress Notes (Signed)
Patient declined ordered insulin and heparin until after dialysis.  Patient wants to wait before changing current regime.

## 2017-03-21 NOTE — Progress Notes (Signed)
Pt refused 2nd set of blood cultures from lab and did not want to be stuck again. Refused insulin. Notified dr.[email or web address]. Acknowledged and no new orders placed.

## 2017-03-21 NOTE — Progress Notes (Signed)
Pre hd assessment  

## 2017-03-21 NOTE — Progress Notes (Signed)
Pre hd info 

## 2017-03-21 NOTE — Consult Note (Signed)
[facility] Infectious Disease     Reason for Consult: Fevers, bacteremia   Referring Physician: Estanislado Spire Date of Admission:  03/20/2017   Active Problems:   Fever   HPI: Timothy Gibson is a 66 y.o. male admitted with diarrhea and fevers. On admit temp 102.9. BCX + 2/2 GPC. He has ESRD and is on HD through l UE AVF. He denies any recent skin or soft tissue infections, no cough, urinary symptoms.  He received HD through a buttonhole on L UE AVF. He has a prosthetic L knee but it has not been bothering him. No back pain. Feels much better since admission. Seen on HD.   Past Medical History:  Diagnosis Date  . Cancer ([place])    right kidney  . Diabetes mellitus without complication ([place])   . Dialysis patient [facility])    on Mon-Wed-Fri dialysis  . ESRD (end stage renal disease) ([place])   . History of gangrene   . Hypertension    Past Surgical History:  Procedure Laterality Date  . COLONOSCOPY WITH PROPOFOL N/A 12/16/2015   Procedure: COLONOSCOPY WITH PROPOFOL;  Surgeon: Manya Silvas, MD;  Location: [facility] ENDOSCOPY;  Service: Endoscopy;  Laterality: N/A;  . KNEE SURGERY    . left forearm AV fistula    . NEPHRECTOMY     Social History  Substance Use Topics  . Smoking status: Current Some Day Smoker  . Smokeless tobacco: Never Used     Comment: cigar smoker  . Alcohol use No   Family History  Problem Relation Age of Onset  . Hypertension Father     Allergies: No Known Allergies  Current antibiotics: Antibiotics Given (last 72 hours)    Date/Time Action Medication Dose Rate   03/20/17 1812 New Bag/Given   piperacillin-tazobactam (ZOSYN) IVPB 3.375 g 3.375 g 100 mL/hr   03/20/17 1843 New Bag/Given   vancomycin (VANCOCIN) 2,000 mg in sodium chloride 0.9 % 500 mL IVPB 2,000 mg 250 mL/hr   03/21/17 1258 Given   metroNIDAZOLE (FLAGYL) tablet 500 mg 500 mg       MEDICATIONS: . carvedilol  12.5 mg Oral BID WC  . heparin  5,000 Units Subcutaneous Q8H  . insulin aspart  0-5  Units Subcutaneous QHS  . insulin aspart  0-9 Units Subcutaneous TID WC  . insulin detemir  12 Units Subcutaneous QHS  . losartan  100 mg Oral Daily  . metoCLOPramide  5 mg Oral TID AC    Review of Systems - 11 systems reviewed and negative per HPI   OBJECTIVE: Temp:  [98.5 F (36.9 C)-102.9 F (39.4 C)] 100.2 F (37.9 C) (09/12 1410) Pulse Rate:  [82-113] 103 (09/12 1410) Resp:  [16-33] 16 (09/12 0506) BP: (125-202)/(45-103) 180/103 (09/12 1410) SpO2:  [92 %-99 %] 99 % (09/12 1410) Weight:  [120.2 kg (265 lb)] 120.2 kg (265 lb) (09/11 1709) Physical Exam  Constitutional: He is oriented to person, place, and time. He appears well-developed and well-nourished. No distress.  HENT:  Mouth/Throat: Oropharynx is clear and moist. No oropharyngeal exudate.  Cardiovascular: Normal rate, regular rhythm and normal heart sounds. Pulmonary/Chest: Effort normal and breath sounds normal. No respiratory distress. He has no wheezes.  Abdominal: Soft. Bowel sounds are normal. He exhibits no distension. There is no tenderness.  Lymphadenopathy: He has no cervical adenopathy.  Neurological: He is alert and oriented to person, place, and time.  Skin: Skin is warm and dry. No rash noted. No erythema.  Psychiatric: He has a normal  mood and affect. His behavior is normal.  LUE AVF wnl   LABS: Results for orders placed or performed during the hospital encounter of 03/20/17 (from the past 48 hour(s))  Comprehensive metabolic panel     Status: Abnormal   Collection Time: 03/20/17  5:09 PM  Result Value Ref Range   Sodium 136 135 - 145 mmol/L   Potassium 4.3 3.5 - 5.1 mmol/L    Comment: HEMOLYSIS AT THIS LEVEL MAY AFFECT RESULT   Chloride 95 (L) 101 - 111 mmol/L   CO2 27 22 - 32 mmol/L   Glucose, Bld 227 (H) 65 - 99 mg/dL   BUN 36 (H) 6 - 20 mg/dL   Creatinine, Ser 9.45 (H) 0.61 - 1.24 mg/dL   Calcium 9.2 8.9 - 10.3 mg/dL   Total Protein 8.2 (H) 6.5 - 8.1 g/dL   Albumin 3.8 3.5 - 5.0 g/dL   AST  23 15 - 41 U/L    Comment: HEMOLYSIS AT THIS LEVEL MAY AFFECT RESULT   ALT 13 (L) 17 - 63 U/L   Alkaline Phosphatase 59 38 - 126 U/L   Total Bilirubin 1.3 (H) 0.3 - 1.2 mg/dL    Comment: HEMOLYSIS AT THIS LEVEL MAY AFFECT RESULT   GFR calc non Af Amer 5 (L) >60 mL/min   GFR calc Af Amer 6 (L) >60 mL/min    Comment: (NOTE) The eGFR has been calculated using the CKD EPI equation. This calculation has not been validated in all clinical situations. eGFR's persistently <60 mL/min signify possible Chronic Kidney Disease.    Anion gap 14 5 - 15  Lactic acid, plasma     Status: None   Collection Time: 03/20/17  5:09 PM  Result Value Ref Range   Lactic Acid, Venous 1.1 0.5 - 1.9 mmol/L  CBC with Differential     Status: Abnormal   Collection Time: 03/20/17  5:09 PM  Result Value Ref Range   WBC 4.7 3.8 - 10.6 K/uL   RBC 3.89 (L) 4.40 - 5.90 MIL/uL   Hemoglobin 12.1 (L) 13.0 - 18.0 g/dL   HCT 36.0 (L) 40.0 - 52.0 %   MCV 92.4 80.0 - 100.0 fL   MCH 31.0 26.0 - 34.0 pg   MCHC 33.5 32.0 - 36.0 g/dL   RDW 16.1 (H) 11.5 - 14.5 %   Platelets 95 (L) 150 - 440 K/uL   Neutrophils Relative % 84 %   Neutro Abs 4.0 1.4 - 6.5 K/uL   Lymphocytes Relative 8 %   Lymphs Abs 0.4 (L) 1.0 - 3.6 K/uL   Monocytes Relative 7 %   Monocytes Absolute 0.3 0.2 - 1.0 K/uL   Eosinophils Relative 0 %   Eosinophils Absolute 0.0 0 - 0.7 K/uL   Basophils Relative 1 %   Basophils Absolute 0.0 0 - 0.1 K/uL  Protime-INR     Status: Abnormal   Collection Time: 03/20/17  5:09 PM  Result Value Ref Range   Prothrombin Time 17.3 (H) 11.4 - 15.2 seconds   INR 1.43   Hemoglobin A1c     Status: Abnormal   Collection Time: 03/20/17  5:09 PM  Result Value Ref Range   Hgb A1c MFr Bld 8.2 (H) 4.8 - 5.6 %    Comment: (NOTE) Pre diabetes:          5.7%-6.4% Diabetes:              >6.4% Glycemic control for   <7.0% adults with diabetes  Mean Plasma Glucose 188.64 mg/dL    Comment: Performed at [facility] [address], [place], [place] [address]  Culture, blood (Routine x 2)     Status: None (Preliminary result)   Collection Time: 03/20/17  5:22 PM  Result Value Ref Range   Specimen Description BLOOD RIGHT ANTECUBITAL    Special Requests      BOTTLES DRAWN AEROBIC AND ANAEROBIC Blood Culture adequate volume   Culture  Setup Time      Organism ID to follow IN BOTH AEROBIC AND ANAEROBIC BOTTLES GRAM POSITIVE COCCI CRITICAL RESULT CALLED TO, READ BACK BY AND VERIFIED WITH: SHEEMA HALLAJI 03/21/17 1256 KLW    Culture GRAM POSITIVE COCCI    Report Status PENDING   Blood Culture ID Panel (Reflexed)     Status: Abnormal   Collection Time: 03/20/17  5:22 PM  Result Value Ref Range   Enterococcus species NOT DETECTED NOT DETECTED   Listeria monocytogenes NOT DETECTED NOT DETECTED   Staphylococcus species DETECTED (A) NOT DETECTED    Comment: Methicillin (oxacillin) resistant coagulase negative staphylococcus. Possible blood culture contaminant (unless isolated from more than one blood culture draw or clinical case suggests pathogenicity). No antibiotic treatment is indicated for blood  culture contaminants. CRITICAL RESULT CALLED TO, READ BACK BY AND VERIFIED WITH: SHEEMA HALLAJI 03/21/17 1256 KLW    Staphylococcus aureus NOT DETECTED NOT DETECTED   Methicillin resistance DETECTED (A) NOT DETECTED    Comment: CRITICAL RESULT CALLED TO, READ BACK BY AND VERIFIED WITH: SHEEMA HALLAJI 03/21/17 1256 KLW    Streptococcus species NOT DETECTED NOT DETECTED   Streptococcus agalactiae NOT DETECTED NOT DETECTED   Streptococcus pneumoniae NOT DETECTED NOT DETECTED   Streptococcus pyogenes NOT DETECTED NOT DETECTED   Acinetobacter baumannii NOT DETECTED NOT DETECTED   Enterobacteriaceae species NOT DETECTED NOT DETECTED   Enterobacter cloacae complex NOT DETECTED NOT DETECTED   Escherichia coli NOT DETECTED NOT DETECTED   Klebsiella oxytoca NOT DETECTED NOT DETECTED   Klebsiella pneumoniae NOT DETECTED  NOT DETECTED   Proteus species NOT DETECTED NOT DETECTED   Serratia marcescens NOT DETECTED NOT DETECTED   Haemophilus influenzae NOT DETECTED NOT DETECTED   Neisseria meningitidis NOT DETECTED NOT DETECTED   Pseudomonas aeruginosa NOT DETECTED NOT DETECTED   Candida albicans NOT DETECTED NOT DETECTED   Candida glabrata NOT DETECTED NOT DETECTED   Candida krusei NOT DETECTED NOT DETECTED   Candida parapsilosis NOT DETECTED NOT DETECTED   Candida tropicalis NOT DETECTED NOT DETECTED  Culture, blood (Routine x 2)     Status: None (Preliminary result)   Collection Time: 03/20/17  5:53 PM  Result Value Ref Range   Specimen Description BLOOD RIGHT ANTECUBITAL    Special Requests      BOTTLES DRAWN AEROBIC AND ANAEROBIC Blood Culture adequate volume   Culture  Setup Time      GRAM POSITIVE COCCI IN BOTH AEROBIC AND ANAEROBIC BOTTLES CRITICAL RESULT CALLED TO, READ BACK BY AND VERIFIED WITH: CHRISTINE KATSOUDAS 03/21/17 1407 KLW    Culture GRAM POSITIVE COCCI    Report Status PENDING   Blood gas, venous     Status: Abnormal   Collection Time: 03/20/17  6:14 PM  Result Value Ref Range   pH, Ven 7.42 7.250 - 7.430   pCO2, Ven 50 44.0 - 60.0 mmHg   pO2, Ven <31.0 (LL) 32.0 - 45.0 mmHg    Comment: VENOUS   Bicarbonate 32.4 (H) 20.0 - 28.0 mmol/L   Acid-Base Excess  6.8 (H) 0.0 - 2.0 mmol/L   Patient temperature 37.0    Collection site VEIN    Sample type VENOUS   Glucose, capillary     Status: Abnormal   Collection Time: 03/20/17  7:11 PM  Result Value Ref Range   Glucose-Capillary 172 (H) 65 - 99 mg/dL  Influenza panel by PCR (type A & B)     Status: None   Collection Time: 03/21/17  1:22 AM  Result Value Ref Range   Influenza A By PCR NEGATIVE NEGATIVE   Influenza B By PCR NEGATIVE NEGATIVE    Comment: (NOTE) The Xpert Xpress Flu assay is intended as an aid in the diagnosis of  influenza and should not be used as a sole basis for treatment.  This  assay is FDA approved for  nasopharyngeal swab specimens only. Nasal  washings and aspirates are unacceptable for Xpert Xpress Flu testing.   Basic metabolic panel     Status: Abnormal   Collection Time: 03/21/17  5:10 AM  Result Value Ref Range   Sodium 137 135 - 145 mmol/L   Potassium 3.8 3.5 - 5.1 mmol/L   Chloride 97 (L) 101 - 111 mmol/L   CO2 28 22 - 32 mmol/L   Glucose, Bld 240 (H) 65 - 99 mg/dL   BUN 43 (H) 6 - 20 mg/dL   Creatinine, Ser 10.66 (H) 0.61 - 1.24 mg/dL   Calcium 8.7 (L) 8.9 - 10.3 mg/dL   GFR calc non Af Amer 4 (L) >60 mL/min   GFR calc Af Amer 5 (L) >60 mL/min    Comment: (NOTE) The eGFR has been calculated using the CKD EPI equation. This calculation has not been validated in all clinical situations. eGFR's persistently <60 mL/min signify possible Chronic Kidney Disease.    Anion gap 12 5 - 15  CBC     Status: Abnormal   Collection Time: 03/21/17  5:10 AM  Result Value Ref Range   WBC 3.7 (L) 3.8 - 10.6 K/uL   RBC 3.59 (L) 4.40 - 5.90 MIL/uL   Hemoglobin 11.2 (L) 13.0 - 18.0 g/dL   HCT 33.2 (L) 40.0 - 52.0 %   MCV 92.5 80.0 - 100.0 fL   MCH 31.3 26.0 - 34.0 pg   MCHC 33.9 32.0 - 36.0 g/dL   RDW 16.6 (H) 11.5 - 14.5 %   Platelets 82 (L) 150 - 440 K/uL  MRSA PCR Screening     Status: None   Collection Time: 03/21/17  5:20 AM  Result Value Ref Range   MRSA by PCR NEGATIVE NEGATIVE    Comment:        The GeneXpert MRSA Assay (FDA approved for NASAL specimens only), is one component of a comprehensive MRSA colonization surveillance program. It is not intended to diagnose MRSA infection nor to guide or monitor treatment for MRSA infections.   Glucose, capillary     Status: Abnormal   Collection Time: 03/21/17  7:51 AM  Result Value Ref Range   Glucose-Capillary 236 (H) 65 - 99 mg/dL   Comment 1 Notify RN   Glucose, capillary     Status: Abnormal   Collection Time: 03/21/17 11:49 AM  Result Value Ref Range   Glucose-Capillary 236 (H) 65 - 99 mg/dL   Comment 1 Notify RN     No components found for: ESR, C REACTIVE PROTEIN MICRO: Recent Results (from the past 720 hour(s))  Culture, blood (Routine x 2)     Status: None (Preliminary  result)   Collection Time: 03/20/17  5:22 PM  Result Value Ref Range Status   Specimen Description BLOOD RIGHT ANTECUBITAL  Final   Special Requests   Final    BOTTLES DRAWN AEROBIC AND ANAEROBIC Blood Culture adequate volume   Culture  Setup Time   Final    Organism ID to follow IN BOTH AEROBIC AND ANAEROBIC BOTTLES GRAM POSITIVE COCCI CRITICAL RESULT CALLED TO, READ BACK BY AND VERIFIED WITH: SHEEMA HALLAJI 03/21/17 1256 KLW    Culture GRAM POSITIVE COCCI  Final   Report Status PENDING  Incomplete  Blood Culture ID Panel (Reflexed)     Status: Abnormal   Collection Time: 03/20/17  5:22 PM  Result Value Ref Range Status   Enterococcus species NOT DETECTED NOT DETECTED Final   Listeria monocytogenes NOT DETECTED NOT DETECTED Final   Staphylococcus species DETECTED (A) NOT DETECTED Final    Comment: Methicillin (oxacillin) resistant coagulase negative staphylococcus. Possible blood culture contaminant (unless isolated from more than one blood culture draw or clinical case suggests pathogenicity). No antibiotic treatment is indicated for blood  culture contaminants. CRITICAL RESULT CALLED TO, READ BACK BY AND VERIFIED WITH: SHEEMA HALLAJI 03/21/17 1256 KLW    Staphylococcus aureus NOT DETECTED NOT DETECTED Final   Methicillin resistance DETECTED (A) NOT DETECTED Final    Comment: CRITICAL RESULT CALLED TO, READ BACK BY AND VERIFIED WITH: SHEEMA HALLAJI 03/21/17 1256 KLW    Streptococcus species NOT DETECTED NOT DETECTED Final   Streptococcus agalactiae NOT DETECTED NOT DETECTED Final   Streptococcus pneumoniae NOT DETECTED NOT DETECTED Final   Streptococcus pyogenes NOT DETECTED NOT DETECTED Final   Acinetobacter baumannii NOT DETECTED NOT DETECTED Final   Enterobacteriaceae species NOT DETECTED NOT DETECTED Final    Enterobacter cloacae complex NOT DETECTED NOT DETECTED Final   Escherichia coli NOT DETECTED NOT DETECTED Final   Klebsiella oxytoca NOT DETECTED NOT DETECTED Final   Klebsiella pneumoniae NOT DETECTED NOT DETECTED Final   Proteus species NOT DETECTED NOT DETECTED Final   Serratia marcescens NOT DETECTED NOT DETECTED Final   Haemophilus influenzae NOT DETECTED NOT DETECTED Final   Neisseria meningitidis NOT DETECTED NOT DETECTED Final   Pseudomonas aeruginosa NOT DETECTED NOT DETECTED Final   Candida albicans NOT DETECTED NOT DETECTED Final   Candida glabrata NOT DETECTED NOT DETECTED Final   Candida krusei NOT DETECTED NOT DETECTED Final   Candida parapsilosis NOT DETECTED NOT DETECTED Final   Candida tropicalis NOT DETECTED NOT DETECTED Final  Culture, blood (Routine x 2)     Status: None (Preliminary result)   Collection Time: 03/20/17  5:53 PM  Result Value Ref Range Status   Specimen Description BLOOD RIGHT ANTECUBITAL  Final   Special Requests   Final    BOTTLES DRAWN AEROBIC AND ANAEROBIC Blood Culture adequate volume   Culture  Setup Time   Final    GRAM POSITIVE COCCI IN BOTH AEROBIC AND ANAEROBIC BOTTLES CRITICAL RESULT CALLED TO, READ BACK BY AND VERIFIED WITH: CHRISTINE KATSOUDAS 03/21/17 1407 KLW    Culture GRAM POSITIVE COCCI  Final   Report Status PENDING  Incomplete  MRSA PCR Screening     Status: None   Collection Time: 03/21/17  5:20 AM  Result Value Ref Range Status   MRSA by PCR NEGATIVE NEGATIVE Final    Comment:        The GeneXpert MRSA Assay (FDA approved for NASAL specimens only), is one component of a comprehensive MRSA colonization surveillance program. It is not  intended to diagnose MRSA infection nor to guide or monitor treatment for MRSA infections.     IMAGING: Dg Chest 2 View  Result Date: 03/20/2017 CLINICAL DATA:  Sepsis.  Dialysis patient EXAM: CHEST  2 VIEW COMPARISON:  09/25/2015 FINDINGS: Cardiac enlargement with vascular congestion.  Negative for edema or effusion. Negative for pneumonia IMPRESSION: Pulmonary vascular congestion without edema Electronically Signed   By: Franchot Gallo M.D.   On: 03/20/2017 18:04    Assessment:   Timothy Gibson is a 66 y.o. male patient with ESRD on HD through LUE AVF using buttonhole technique now with acute onset fevers, chills, nausea diarrhea. Tolerated HD Monday but sxs began Monday afternoon. BCX + 2/2 Staph species not Staph aureus. Clinically improving.  CXR neg. 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  Recommendations Continue vancomycin Repeat bcx Check echo  If echo neg and clears bcx would rec a 4 week course from first neg bcx.   Thank you very much for allowing me to participate in the care of this patient. Please call with questions.   Cheral Marker. Ola Spurr, MD

## 2017-03-21 NOTE — Progress Notes (Signed)
Post hd vitals 

## 2017-03-21 NOTE — Progress Notes (Signed)
Post hd assessment 

## 2017-03-21 NOTE — Progress Notes (Signed)
Hd start, alert, no c/o, stable, in chair

## 2017-03-21 NOTE — Care Management (Signed)
Amanda Morris HD liaison notified of admission.  

## 2017-03-21 NOTE — Progress Notes (Addendum)
[facility], [place] 03/21/17  Subjective:  Pt is known to [place] from outpatient dialysis he is followed by Dr. Holley Raring at [facility].  Pt complains of fever, vomiting, poor appetite for one day. Also reports chronic diarrhea since January post colonoscopy. He gets dialysis MWF, normal EDW is 121. Denies cough, shortness of breath, and wheezing.   Objective:  Vital signs in last 24 hours:  Temp:  [98.5 F (36.9 C)-102.9 F (39.4 C)] 100.2 F (37.9 C) (09/12 1410) Pulse Rate:  [82-113] 103 (09/12 1410) Resp:  [16-33] 16 (09/12 0506) BP: (125-202)/(45-103) 180/103 (09/12 1410) SpO2:  [92 %-99 %] 99 % (09/12 1410) Weight:  [120.2 kg (265 lb)] 120.2 kg (265 lb) (09/11 1709)  Weight change:  Filed Weights   03/20/17 1709  Weight: 120.2 kg (265 lb)    Intake/Output:    Intake/Output Summary (Last 24 hours) at 03/21/17 1546 Last data filed at 03/20/17 2105  Gross per 24 hour  Intake              500 ml  Output                0 ml  Net              500 ml     Physical Exam: General: Pt is lying in bed in NAD  HEENT Sclera anicteric  Neck No venous distention noted  Pulm/lungs Clear to auscultation bilaterally  CVS/Heart Regular rhythm without murmur   Abdomen:  Soft, non tender   Extremities: + dependent lower extremity edema  Neurologic: Alert, awake  Skin: No rashes  Access: Left forearm AVF with buttonhole       Basic Metabolic Panel:   Recent Labs Lab 03/20/17 1709 03/21/17 0510  NA 136 137  K 4.3 3.8  CL 95* 97*  CO2 27 28  GLUCOSE 227* 240*  BUN 36* 43*  CREATININE 9.45* 10.66*  CALCIUM 9.2 8.7*     CBC:  Recent Labs Lab 03/20/17 1709 03/21/17 0510  WBC 4.7 3.7*  NEUTROABS 4.0  --   HGB 12.1* 11.2*  HCT 36.0* 33.2*  MCV 92.4 92.5  PLT 95* 82*     No results found for: HEPBSAG, HEPBSAB, HEPBIGM    Microbiology:  Recent Results (from the past 240 hour(s))  Culture, blood (Routine x 2)     Status: None  (Preliminary result)   Collection Time: 03/20/17  5:22 PM  Result Value Ref Range Status   Specimen Description BLOOD RIGHT ANTECUBITAL  Final   Special Requests   Final    BOTTLES DRAWN AEROBIC AND ANAEROBIC Blood Culture adequate volume   Culture  Setup Time   Final    Organism ID to follow IN BOTH AEROBIC AND ANAEROBIC BOTTLES GRAM POSITIVE COCCI CRITICAL RESULT CALLED TO, READ BACK BY AND VERIFIED WITH: SHEEMA HALLAJI 03/21/17 1256 KLW    Culture GRAM POSITIVE COCCI  Final   Report Status PENDING  Incomplete  Blood Culture ID Panel (Reflexed)     Status: Abnormal   Collection Time: 03/20/17  5:22 PM  Result Value Ref Range Status   Enterococcus species NOT DETECTED NOT DETECTED Final   Listeria monocytogenes NOT DETECTED NOT DETECTED Final   Staphylococcus species DETECTED (A) NOT DETECTED Final    Comment: Methicillin (oxacillin) resistant coagulase negative staphylococcus. Possible blood culture contaminant (unless isolated from more than one blood culture draw or clinical case suggests pathogenicity). No antibiotic treatment is indicated for blood  culture contaminants. CRITICAL RESULT CALLED TO, READ BACK BY AND VERIFIED WITH: SHEEMA HALLAJI 03/21/17 1256 KLW    Staphylococcus aureus NOT DETECTED NOT DETECTED Final   Methicillin resistance DETECTED (A) NOT DETECTED Final    Comment: CRITICAL RESULT CALLED TO, READ BACK BY AND VERIFIED WITH: SHEEMA HALLAJI 03/21/17 1256 KLW    Streptococcus species NOT DETECTED NOT DETECTED Final   Streptococcus agalactiae NOT DETECTED NOT DETECTED Final   Streptococcus pneumoniae NOT DETECTED NOT DETECTED Final   Streptococcus pyogenes NOT DETECTED NOT DETECTED Final   Acinetobacter baumannii NOT DETECTED NOT DETECTED Final   Enterobacteriaceae species NOT DETECTED NOT DETECTED Final   Enterobacter cloacae complex NOT DETECTED NOT DETECTED Final   Escherichia coli NOT DETECTED NOT DETECTED Final   Klebsiella oxytoca NOT DETECTED NOT  DETECTED Final   Klebsiella pneumoniae NOT DETECTED NOT DETECTED Final   Proteus species NOT DETECTED NOT DETECTED Final   Serratia marcescens NOT DETECTED NOT DETECTED Final   Haemophilus influenzae NOT DETECTED NOT DETECTED Final   Neisseria meningitidis NOT DETECTED NOT DETECTED Final   Pseudomonas aeruginosa NOT DETECTED NOT DETECTED Final   Candida albicans NOT DETECTED NOT DETECTED Final   Candida glabrata NOT DETECTED NOT DETECTED Final   Candida krusei NOT DETECTED NOT DETECTED Final   Candida parapsilosis NOT DETECTED NOT DETECTED Final   Candida tropicalis NOT DETECTED NOT DETECTED Final  Culture, blood (Routine x 2)     Status: None (Preliminary result)   Collection Time: 03/20/17  5:53 PM  Result Value Ref Range Status   Specimen Description BLOOD RIGHT ANTECUBITAL  Final   Special Requests   Final    BOTTLES DRAWN AEROBIC AND ANAEROBIC Blood Culture adequate volume   Culture  Setup Time   Final    GRAM POSITIVE COCCI IN BOTH AEROBIC AND ANAEROBIC BOTTLES CRITICAL RESULT CALLED TO, READ BACK BY AND VERIFIED WITH: CHRISTINE KATSOUDAS 03/21/17 1407 KLW    Culture GRAM POSITIVE COCCI  Final   Report Status PENDING  Incomplete  MRSA PCR Screening     Status: None   Collection Time: 03/21/17  5:20 AM  Result Value Ref Range Status   MRSA by PCR NEGATIVE NEGATIVE Final    Comment:        The GeneXpert MRSA Assay (FDA approved for NASAL specimens only), is one component of a comprehensive MRSA colonization surveillance program. It is not intended to diagnose MRSA infection nor to guide or monitor treatment for MRSA infections.     Coagulation Studies:  Recent Labs  03/20/17 1709  LABPROT 17.3*  INR 1.43    Urinalysis: No results for input(s): COLORURINE, LABSPEC, PHURINE, GLUCOSEU, HGBUR, BILIRUBINUR, KETONESUR, PROTEINUR, UROBILINOGEN, NITRITE, LEUKOCYTESUR in the last 72 hours.  Invalid input(s): APPERANCEUR    Imaging: Dg Chest 2 View  Result Date:  03/20/2017 CLINICAL DATA:  Sepsis.  Dialysis patient EXAM: CHEST  2 VIEW COMPARISON:  09/25/2015 FINDINGS: Cardiac enlargement with vascular congestion. Negative for edema or effusion. Negative for pneumonia IMPRESSION: Pulmonary vascular congestion without edema Electronically Signed   By: Franchot Gallo M.D.   On: 03/20/2017 18:04     Medications:   . piperacillin-tazobactam (ZOSYN)  IV    . vancomycin     . carvedilol  12.5 mg Oral BID WC  . heparin  5,000 Units Subcutaneous Q8H  . insulin aspart  0-5 Units Subcutaneous QHS  . insulin aspart  0-9 Units Subcutaneous TID WC  . insulin detemir  12 Units Subcutaneous QHS  .  losartan  100 mg Oral Daily  . metoCLOPramide  5 mg Oral TID AC   acetaminophen **OR** acetaminophen, hydrALAZINE, ondansetron **OR** ondansetron (ZOFRAN) IV, polyethylene glycol  Assessment/ Plan:  66 y.o. male with ESRD, right nephrectomy, DM, cancer, and HTN. Admitted for evaluation of fever, vomiting, and chronic diarrhea.   ESRD - HD outpatient [facility] [address] 4hrs/MWF/L forearm AVF/121 kg - Will continue dialysis while in hospital - UF goal today 0.5kg  Hypertension, poorly controlled - continue home meds in hospital.   Secondary Hyperparathyroidism - monitor phosphorus - hold binders until GI symptoms improve  Anemia of CKD - Hb 11.2 - low dose EPO with dialysis   Fever  - MRSE bacteremia treated with vancomycin and zosyn   LOS: 1 Xylah Early 9/12/20183:46 PM  [place], [place]

## 2017-03-21 NOTE — Progress Notes (Signed)
Pharmacy Antibiotic Note  Timothy Gibson is a 66 y.o. male admitted on 03/20/2017 with bacteremia/sepsis.  Pharmacy has been consulted for Zosyn and vancomycin dosing.  Plan: 1. Zosyn 3.375 gm IV Q12H EI 2. Vancomycin 2 gm IV x 1 last night followed by vancomycin 1 gm IV Q-dialysis (MWF). Pharmacy will continue to follow and adjust as needed to maintain trough 15 to 20 mcg/mL.   Height: 6\' 4"  (193 cm) Weight: 265 lb (120.2 kg) IBW/kg (Calculated) : 86.8  Temp (24hrs), Avg:100.6 F (38.1 C), Min:98.5 F (36.9 C), Max:102.9 F (39.4 C)   Recent Labs Lab 03/20/17 1709 03/21/17 0510  WBC 4.7 3.7*  CREATININE 9.45* 10.66*  LATICACIDVEN 1.1  --     Estimated Creatinine Clearance: 9.7 mL/min (A) (by C-G formula based on SCr of 10.66 mg/dL (H)).    No Known Allergies  Thank you for allowing pharmacy to be a part of this patient's care.  Laural Benes, Pharm.D., BCPS Clinical Pharmacist 03/21/2017 1:24 PM

## 2017-03-22 ENCOUNTER — Inpatient Hospital Stay (HOSPITAL_COMMUNITY)
Admit: 2017-03-22 | Discharge: 2017-03-22 | Disposition: A | Discharge status: Home / Self Care | Payer: Medicare Other | Attending: Infectious Diseases | Admitting: Infectious Diseases

## 2017-03-22 DIAGNOSIS — I340 Nonrheumatic mitral (valve) insufficiency: Secondary | ICD-10-CM

## 2017-03-22 LAB — GASTROINTESTINAL PANEL BY PCR, STOOL (REPLACES STOOL CULTURE)

## 2017-03-22 LAB — GLUCOSE, CAPILLARY
GLUCOSE-CAPILLARY: 238 mg/dL — AB (ref 65–99)
Glucose-Capillary: 234 mg/dL — ABNORMAL HIGH (ref 65–99)

## 2017-03-22 LAB — C DIFFICILE QUICK SCREEN W PCR REFLEX
C DIFFICILE (CDIFF) TOXIN: NEGATIVE
C Diff antigen: NEGATIVE
C Diff interpretation: NOT DETECTED

## 2017-03-22 LAB — ECHOCARDIOGRAM COMPLETE
Height: 76 in
WEIGHTICAEL: 4201.09 [oz_av]

## 2017-03-22 LAB — VANCOMYCIN, RANDOM: Vancomycin Rm: 14

## 2017-03-22 MED ORDER — CARVEDILOL 12.5 MG PO TABS: 12.5000 mg | ORAL_TABLET | Freq: Two times a day (BID) | ORAL | 2 refills | Status: DC

## 2017-03-22 MED ORDER — VANCOMYCIN HCL IN DEXTROSE 1-5 GM/200ML-% IV SOLN: 1000.0000 mg | INTRAVENOUS | 0 refills | Status: DC

## 2017-03-22 MED ORDER — SIMETHICONE 80 MG PO CHEW
160.0000 mg | CHEWABLE_TABLET | Freq: Two times a day (BID) | ORAL | Status: DC
  Administered 2017-03-22: 160 mg via ORAL
  Filled 2017-03-22 (×2): qty 2

## 2017-03-22 MED ORDER — VANCOMYCIN HCL 500 MG IV SOLR
500.0000 mg | Freq: Once | INTRAVENOUS | Status: AC
  Administered 2017-03-22: 500 mg via INTRAVENOUS
  Filled 2017-03-22: qty 500

## 2017-03-22 MED ORDER — METOCLOPRAMIDE HCL 5 MG PO TABS: 5.0000 mg | ORAL_TABLET | Freq: Three times a day (TID) | ORAL | 0 refills | Status: DC

## 2017-03-22 NOTE — Discharge Summary (Signed)
Timothy Gibson at [place] NAME: Timothy Gibson    MR#:  [id number]  DATE OF BIRTH:  [birth year]  DATE OF ADMISSION:  03/20/2017   ADMITTING PHYSICIAN: Gladstone Lighter, MD  DATE OF DISCHARGE: 03/22/2017  2:42 PM  PRIMARY CARE PHYSICIAN: Marden Noble, MD   ADMISSION DIAGNOSIS:   Sinus tachycardia [R00.0] Sepsis, due to unspecified organism ([place]) [A41.9] Diarrhea, unspecified type [R19.7] Hypertension, unspecified type [I10]  DISCHARGE DIAGNOSIS:   Active Problems:   Fever   SECONDARY DIAGNOSIS:   Past Medical History:  Diagnosis Date  . Cancer ([place])    right kidney  . Diabetes mellitus without complication ([place])   . Dialysis patient [facility])    on Mon-Wed-Fri dialysis  . ESRD (end stage renal disease) ([place])   . History of gangrene   . Hypertension     HOSPITAL COURSE:   Timothy Gibson  is a 66 y.o. 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  #1 Fever- Secondary to coagulase-negative staphylococcus bacteremia with a normal blood culture. -Likely source is buttonholed technique and lesions noted on AV fistula. -No erythema or tenderness around the fistula. Patient also has history of prosthetic knee but no issues with it. -Repeat blood cultures are negative. Echo done but the results are pending, however no significant murmur heard on exam. -Appreciate ID consult. Patient will receive 4 weeks of vancomycin with dialysis.   #2 end-stage renal disease-on Monday Wednesday Friday dialysis. -Status post right nephrectomy after renal cell cancer. -Nephrology consulted for the same-receive dialysis in the hospital per schedule  #3 diabetes mellitus-restarted his insulin.on Levemir  #4 hypertension-coreg and losartan-Coreg dose increased.  #5 diabetic  gastroparesis-improved with oral Reglan.  Patient stable and wants to be discharged.  DISCHARGE CONDITIONS:   Guarded  CONSULTS OBTAINED:   Treatment Team:  Leonel Ramsay, MD  DRUG ALLERGIES:   No Known Allergies DISCHARGE MEDICATIONS:   Allergies as of 03/22/2017   No Known Allergies     Medication List    STOP taking these medications   diphenoxylate-atropine 2.5-0.025 MG tablet Commonly known as:  LOMOTIL   gabapentin 100 MG capsule Commonly known as:  NEURONTIN   HYDROcodone-acetaminophen 5-325 MG tablet Commonly known as:  NORCO/VICODIN   metoprolol tartrate 25 MG tablet Commonly known as:  LOPRESSOR   ondansetron 4 MG disintegrating tablet Commonly known as:  ZOFRAN ODT     TAKE these medications   AURYXIA 1 GM 210 MG(Fe) tablet Generic drug:  ferric citrate Take 3 tablets by mouth 2 (two) times daily with a meal.   carvedilol 12.5 MG tablet Commonly known as:  COREG Take 1 tablet (12.5 mg total) by mouth 2 (two) times daily with a meal. What changed:  medication strength  how much to take  when to take this   EMLA cream Generic drug:  lidocaine-prilocaine Apply 1 application topically every Monday, Wednesday, and Friday. Apply 2 hours before dialysis.   LEVEMIR FLEXTOUCH 100 UNIT/ML Pen Generic drug:  Insulin Detemir Inject 6-13 Units into the skin at bedtime.   losartan 100 MG tablet Commonly known as:  COZAAR Take 100 mg by mouth daily.   metoCLOPramide 5 MG tablet Commonly known as:  REGLAN Take 1 tablet (5 mg total) by mouth 3 (three) times daily before meals.   RENVELA 800 MG tablet Generic drug:  sevelamer carbonate Take 1,600 mg by mouth 2 (two) times daily with a meal.   vancomycin 1-5 GM/200ML-% Soln Commonly known as:  VANCOCIN Inject 200 mLs (1,000 mg total) into the vein every Monday, Wednesday, and Friday with hemodialysis. X 4 weeks            Home Infusion Instuctions        Start     Ordered    03/22/17 0000  Home infusion instructions [facility] May follow [place] Dosing Protocol; May administer Cathflo as needed to maintain patency of vascular access device.; Flushing of vascular access device: per [facility] Protocol: 0.9% NaCl pre/post medica...    Question Answer Comment  Instructions May follow [place] Dosing Protocol   Instructions May administer Cathflo as needed to maintain patency of vascular access device.   Instructions Flushing of vascular access device: per [facility] Protocol: 0.9% NaCl pre/post medication administration and prn patency; Heparin 100 u/ml, 77ml for implanted ports and Heparin 10u/ml, 85ml for all other central venous catheters.   Instructions May follow [facility] Anaphylaxis Protocol for First Dose Administration in the home: 0.9% NaCl at 25-50 ml/hr to maintain IV access for protocol meds. Epinephrine 0.3 ml IV/IM PRN and Benadryl 25-50 IV/IM PRN s/s of anaphylaxis.   Instructions [facility] Infusion Coordinator (RN) to assist per patient IV care needs in the home PRN.      03/22/17 1315       Discharge Care Instructions        Start     Ordered   03/23/17 0000  vancomycin (VANCOCIN) 1-5 GM/200ML-% SOLN  Every M-W-F (Hemodialysis)     03/22/17 1315   03/22/17 0000  Home infusion instructions [facility] May follow [facility] Dosing Protocol; May administer Cathflo as needed to maintain patency of vascular access device.; Flushing of vascular access device: per [facility] Protocol: 0.9% NaCl pre/post medica...    Question Answer Comment  Instructions May follow [place] Dosing Protocol   Instructions May administer Cathflo as needed to maintain patency of vascular access device.   Instructions Flushing of vascular access device: per [facility] Protocol: 0.9% NaCl pre/post medication administration and prn patency; Heparin 100 u/ml, 5ml for implanted ports and Heparin 10u/ml, 19ml for all other central venous catheters.   Instructions May follow [facility]  Anaphylaxis Protocol for First Dose Administration in the home: 0.9% NaCl at 25-50 ml/hr to maintain IV access for protocol meds. Epinephrine 0.3 ml IV/IM PRN and Benadryl 25-50 IV/IM PRN s/s of anaphylaxis.   Instructions [facility] Infusion Coordinator (RN) to assist per patient IV care needs in the home PRN.      03/22/17 1315   03/22/17 0000  metoCLOPramide (REGLAN) 5 MG tablet  3 times daily before meals     03/22/17 1315   03/22/17 0000  carvedilol (COREG) 12.5 MG tablet  2 times daily with meals     03/22/17 1315   03/22/17 0000  Diet - low sodium heart healthy     03/22/17 1315   03/22/17 0000  Activity as tolerated - No restrictions     03/22/17 1315       DISCHARGE INSTRUCTIONS:   1. PCP follow-up in 1-2 weeks 2. ID follow-up in 3-4 weeks 3. For dialysis as per schedule  DIET:   Renal diet  ACTIVITY:   Activity as tolerated  OXYGEN:   Home Oxygen: No.  Oxygen Delivery: room air  DISCHARGE LOCATION:   home  If you experience worsening of your admission symptoms, develop shortness of breath, life threatening emergency, suicidal or homicidal thoughts you must seek medical attention immediately by calling 911 or calling your MD immediately  if symptoms less severe.  You Must read complete instructions/literature along with all the possible adverse reactions/side effects for all the Medicines you take and that have been prescribed to you. Take any new Medicines after you have completely understood and accpet all the possible adverse reactions/side effects.   Please note  You were cared for by a hospitalist during your hospital stay. If you have any questions about your discharge medications or the care you received while you were in the hospital after you are discharged, you can call the unit and asked to speak with the hospitalist on call if the hospitalist that took care of you is not available. Once you are discharged, your primary care physician will  handle any further medical issues. Please note that NO REFILLS for any discharge medications will be authorized once you are discharged, as it is imperative that you return to your primary care physician (or establish a relationship with a primary care physician if you do not have one) for your aftercare needs so that they can reassess your need for medications and monitor your lab values.    On the day of Discharge:  VITAL SIGNS:   Blood pressure (!) 142/69, pulse 75, temperature 99.4 F (37.4 C), temperature source Oral, resp. rate 16, height 6\' 4"  (1.93 m), weight 119.1 kg (262 lb 9.1 oz), SpO2 96 %.  PHYSICAL EXAMINATION:    GENERAL:  66 y.o.-year-old patient lying in the bed with no acute distress.  EYES: Pupils equal, round, reactive to light and accommodation. No scleral icterus. Extraocular muscles intact.  HEENT: Head atraumatic, normocephalic. Oropharynx and nasopharynx clear.  NECK:  Supple, no jugular venous distention. No thyroid enlargement, no tenderness.  LUNGS: Normal breath sounds bilaterally, no wheezing, rales,rhonchi or crepitation. No use of accessory muscles of respiration. Decreased bibasilar breath sounds CARDIOVASCULAR: S1, S2 normal. No murmurs, rubs, or gallops.  ABDOMEN: Soft, nontender, nondistended. Bowel sounds present. No organomegaly or mass.  EXTREMITIES: No pedal edema, cyanosis, or clubbing. Left fore arm fistula with good thrill- no erythema, swelling or tenderness around it. NEUROLOGIC: Cranial nerves II through XII are intact. Muscle strength 5/5 in all extremities. Sensation intact. Gait not checked.  PSYCHIATRIC: The patient is alert and oriented x 3.  SKIN: No obvious rash, lesion, or ulcer.   DATA REVIEW:   CBC  Recent Labs Lab 03/21/17 0510  WBC 3.7*  HGB 11.2*  HCT 33.2*  PLT 82*    Chemistries   Recent Labs Lab 03/20/17 1709 03/21/17 0510  NA 136 137  K 4.3 3.8  CL 95* 97*  CO2 27 28  GLUCOSE 227* 240*  BUN 36* 43*    CREATININE 9.45* 10.66*  CALCIUM 9.2 8.7*  AST 23  --   ALT 13*  --   ALKPHOS 59  --   BILITOT 1.3*  --      Microbiology Results  Results for orders placed or performed during the hospital encounter of 03/20/17  Culture, blood (Routine x 2)     Status: None (Preliminary result)   Collection Time: 03/20/17  5:22 PM  Result Value Ref Range Status   Specimen Description BLOOD RIGHT ANTECUBITAL  Final   Special Requests   Final    BOTTLES DRAWN AEROBIC AND ANAEROBIC Blood Culture adequate volume  Culture  Setup Time   Final    IN BOTH AEROBIC AND ANAEROBIC BOTTLES GRAM POSITIVE COCCI CRITICAL RESULT CALLED TO, READ BACK BY AND VERIFIED WITH: SHEEMA HALLAJI 03/21/17 1256 KLW    Culture   Final    GRAM POSITIVE COCCI CULTURE REINCUBATED FOR BETTER GROWTH Performed at [facility], [place] [address], [place], [place] [address]    Report Status PENDING  Incomplete  Blood Culture ID Panel (Reflexed)     Status: Abnormal   Collection Time: 03/20/17  5:22 PM  Result Value Ref Range Status   Enterococcus species NOT DETECTED NOT DETECTED Final   Listeria monocytogenes NOT DETECTED NOT DETECTED Final   Staphylococcus species DETECTED (A) NOT DETECTED Final    Comment: Methicillin (oxacillin) resistant coagulase negative staphylococcus. Possible blood culture contaminant (unless isolated from more than one blood culture draw or clinical case suggests pathogenicity). No antibiotic treatment is indicated for blood  culture contaminants. CRITICAL RESULT CALLED TO, READ BACK BY AND VERIFIED WITH: SHEEMA HALLAJI 03/21/17 1256 KLW    Staphylococcus aureus NOT DETECTED NOT DETECTED Final   Methicillin resistance DETECTED (A) NOT DETECTED Final    Comment: CRITICAL RESULT CALLED TO, READ BACK BY AND VERIFIED WITH: SHEEMA HALLAJI 03/21/17 1256 KLW    Streptococcus species NOT DETECTED NOT DETECTED Final   Streptococcus agalactiae NOT DETECTED NOT DETECTED Final   Streptococcus pneumoniae  NOT DETECTED NOT DETECTED Final   Streptococcus pyogenes NOT DETECTED NOT DETECTED Final   Acinetobacter baumannii NOT DETECTED NOT DETECTED Final   Enterobacteriaceae species NOT DETECTED NOT DETECTED Final   Enterobacter cloacae complex NOT DETECTED NOT DETECTED Final   Escherichia coli NOT DETECTED NOT DETECTED Final   Klebsiella oxytoca NOT DETECTED NOT DETECTED Final   Klebsiella pneumoniae NOT DETECTED NOT DETECTED Final   Proteus species NOT DETECTED NOT DETECTED Final   Serratia marcescens NOT DETECTED NOT DETECTED Final   Haemophilus influenzae NOT DETECTED NOT DETECTED Final   Neisseria meningitidis NOT DETECTED NOT DETECTED Final   Pseudomonas aeruginosa NOT DETECTED NOT DETECTED Final   Candida albicans NOT DETECTED NOT DETECTED Final   Candida glabrata NOT DETECTED NOT DETECTED Final   Candida krusei NOT DETECTED NOT DETECTED Final   Candida parapsilosis NOT DETECTED NOT DETECTED Final   Candida tropicalis NOT DETECTED NOT DETECTED Final  Culture, blood (Routine x 2)     Status: None (Preliminary result)   Collection Time: 03/20/17  5:53 PM  Result Value Ref Range Status   Specimen Description BLOOD RIGHT ANTECUBITAL  Final   Special Requests   Final    BOTTLES DRAWN AEROBIC AND ANAEROBIC Blood Culture adequate volume   Culture  Setup Time   Final    GRAM POSITIVE COCCI IN BOTH AEROBIC AND ANAEROBIC BOTTLES CRITICAL RESULT CALLED TO, READ BACK BY AND VERIFIED WITH: CHRISTINE KATSOUDAS 03/21/17 1407 KLW    Culture   Final    GRAM POSITIVE COCCI CULTURE REINCUBATED FOR BETTER GROWTH Performed at [facility], [address] [place], [place] [address]    Report Status PENDING  Incomplete  Gastrointestinal Panel by PCR , Stool     Status: None   Collection Time: 03/20/17  8:29 PM  Result Value Ref Range Status   Campylobacter species NOT DETECTED NOT DETECTED Final   Plesimonas shigelloides NOT DETECTED NOT DETECTED Final   Salmonella species NOT DETECTED NOT  DETECTED Final   Yersinia enterocolitica NOT DETECTED NOT DETECTED Final   Vibrio species NOT  DETECTED NOT DETECTED Final   Vibrio cholerae NOT DETECTED NOT DETECTED Final   Enteroaggregative E coli (EAEC) NOT DETECTED NOT DETECTED Final   Enteropathogenic E coli (EPEC) NOT DETECTED NOT DETECTED Final   Enterotoxigenic E coli (ETEC) NOT DETECTED NOT DETECTED Final   Shiga like toxin producing E coli (STEC) NOT DETECTED NOT DETECTED Final   Shigella/Enteroinvasive E coli (EIEC) NOT DETECTED NOT DETECTED Final   Cryptosporidium NOT DETECTED NOT DETECTED Final   Cyclospora cayetanensis NOT DETECTED NOT DETECTED Final   Entamoeba histolytica NOT DETECTED NOT DETECTED Final   Giardia lamblia NOT DETECTED NOT DETECTED Final   Adenovirus F40/41 NOT DETECTED NOT DETECTED Final   Astrovirus NOT DETECTED NOT DETECTED Final   Norovirus GI/GII NOT DETECTED NOT DETECTED Final   Rotavirus A NOT DETECTED NOT DETECTED Final   Sapovirus (I, II, IV, and V) NOT DETECTED NOT DETECTED Final  MRSA PCR Screening     Status: None   Collection Time: 03/21/17  5:20 AM  Result Value Ref Range Status   MRSA by PCR NEGATIVE NEGATIVE Final    Comment:        The GeneXpert MRSA Assay (FDA approved for NASAL specimens only), is one component of a comprehensive MRSA colonization surveillance program. It is not intended to diagnose MRSA infection nor to guide or monitor treatment for MRSA infections.   Culture, blood (Routine X 2) w Reflex to ID Panel     Status: None (Preliminary result)   Collection Time: 03/21/17  8:20 PM  Result Value Ref Range Status   Specimen Description BLOOD BLOOD RIGHT HAND  Final   Special Requests   Final    BOTTLES DRAWN AEROBIC AND ANAEROBIC Blood Culture adequate volume   Culture NO GROWTH < 12 HOURS  Final   Report Status PENDING  Incomplete  C difficile quick scan w PCR reflex     Status: None   Collection Time: 03/21/17  8:29 PM  Result Value Ref Range Status   C Diff  antigen NEGATIVE NEGATIVE Final   C Diff toxin NEGATIVE NEGATIVE Final   C Diff interpretation No C. difficile detected.  Final  Culture, blood (Routine X 2) w Reflex to ID Panel     Status: None (Preliminary result)   Collection Time: 03/22/17  3:51 AM  Result Value Ref Range Status   Specimen Description BLOOD RFOA  Final   Special Requests   Final    BOTTLES DRAWN AEROBIC AND ANAEROBIC Blood Culture adequate volume   Culture NO GROWTH < 12 HOURS  Final   Report Status PENDING  Incomplete    RADIOLOGY:  No results found.   Management plans discussed with the patient, family and they are in agreement.  CODE STATUS:     Code Status Orders        Start     Ordered   03/20/17 2203  Full code  Continuous     03/20/17 2202    Code Status History    Date Active Date Inactive Code Status Order ID Comments User Context   09/25/2015  3:53 PM 09/26/2015  1:12 PM Full Code [id number]  Bettey Costa, MD ED    Advance Directive Documentation     Most Recent Value  Type of Advance Directive  Living will  Pre-existing out of facility DNR order (yellow form or pink MOST form)  -  "MOST" Form in Place?  -      TOTAL TIME TAKING CARE OF THIS PATIENT:  37 minutes.    Gladstone Lighter M.D on 03/22/2017 at 2:54 PM  Between 7am to 6pm - Pager - [phone]  After 6pm go to [email or web address] - Proofreader  [facility]  Office  [phone]  CC: Primary care physician; Marden Noble, MD   Note: This dictation was prepared with Dragon dictation along with smaller phrase technology. Any transcriptional errors that result from this process are unintentional.

## 2017-03-22 NOTE — Progress Notes (Signed)
Inpatient Diabetes Program Recommendations  AACE/ADA: New Consensus Statement on Inpatient Glycemic Control (2015)  Target Ranges:  Prepandial:   less than 140 mg/dL      Peak postprandial:   less than 180 mg/dL (1-2 hours)      Critically ill patients:  140 - 180 mg/dL  Results for Timothy Gibson, Timothy Gibson (MRN [id number]) as of 03/22/2017 13:54  Ref. Range 03/21/2017 07:51 03/21/2017 11:49 03/21/2017 21:14 03/22/2017 07:56 03/22/2017 11:29  Glucose-Capillary Latest Ref Range: 65 - 99 mg/dL 236 (H) 236 (H) 208 (H) 238 (H) 234 (H)   Results for BARTOW, ZYLSTRA (MRN [id number]) as of 03/22/2017 13:54  Ref. Range 03/20/2017 17:09  Hemoglobin A1C Latest Ref Range: 4.8 - 5.6 % 8.2 (H)   Review of Glycemic Control  Diabetes history: DM2 Outpatient Diabetes medications: Levemir 6-13 units QHS Current orders for Inpatient glycemic control: Levemir 12 units QHS, Novolog 0-9 units TID with meals, Novolog 0-5 units QHS  Inpatient Diabetes Program Recommendations: Insulin - Basal: Noted patient refused to take Levemir last night. As a result glucose 238 mg/dl today.  Correction (SSI): Noted patient has been refusing to take Novolog insulin (per RN note patient states he does not take insulin if his glucose is less than 250 mg/dl).  A1C: A1C 8.2% on 03/20/17 indicating an average glucose of 189 mg/dl over the past 2-3 months.  NOTE: Patient has order to be discharged and patient states that he is getting ready to leave. Spoke with patient (over the phone) about diabetes and home regimen for diabetes control. Patient reports that he currently he takes Levemir 6-13 units QHS when needed as an outpatient for diabetes control. Patient reports that if his glucose is less than 250-300 mg/dl he doesn't take insulin. Patient reports that in the past he one time took Levemir 16 units and he has a bad hypoglycemia episodes. So he does not want his glucose to get too low. Inquired about refusing insulin as an inpatient and he  stated that he did not want to take insulin unless his glucose is less than 250 mg/dl.  Discussed glucose goals and explained that when glucose is elevated, infections can be worse and hard to clear due to bacteria getting worse when glucose levels are high. Discussed A1C results (8.2% on 03/20/17) and explained that his current A1C indicates an average glucose of 189 mg/dl over the past 2-3 months. Patient states that his A1C usually ranges from 7-8.9%. Discussed importance of checking CBGs and maintaining good CBG control to prevent long-term and short-term complications. Explained how hyperglycemia leads to damage within blood vessels which lead to the common complications seen with uncontrolled diabetes. Stressed to the patient the importance of improving glycemic control to prevent further complications from uncontrolled diabetes especially since he has an infection.  Encouraged patient to try to keep glucose better controlled and to continue checking his glucose at home. Encouraged patient to take at least smaller dose of Levemir daily as ordered to improve DM control. Patient verbalized understanding of information discussed and he states that he has no further questions at this time related to diabetes.  Thanks, Barnie Alderman, RN, MSN, CDE Diabetes Coordinator Inpatient Diabetes Program [phone] (Team Pager)

## 2017-03-22 NOTE — Progress Notes (Signed)
[facility] INFECTIOUS DISEASE PROGRESS NOTE Date of Admission:  03/20/2017     ID: Timothy Gibson is a 66 y.o. male with  Coag neg staph bacteremia Active Problems:   Fever   Subjective: Decreasing fever curve. No other complaints  ROS  Ele[employer] systems are reviewed and negative except per hpi  Medications:  Antibiotics Given (last 72 hours)    Date/Time Action Medication Dose Rate   03/20/17 1812 New Bag/Given   piperacillin-tazobactam (ZOSYN) IVPB 3.375 g 3.375 g 100 mL/hr   03/20/17 1843 New Bag/Given   vancomycin (VANCOCIN) 2,000 mg in sodium chloride 0.9 % 500 mL IVPB 2,000 mg 250 mL/hr   03/21/17 1258 Given   metroNIDAZOLE (FLAGYL) tablet 500 mg 500 mg    03/21/17 2207 New Bag/Given   piperacillin-tazobactam (ZOSYN) IVPB 3.375 g 3.375 g 12.5 mL/hr   03/22/17 1109 New Bag/Given   piperacillin-tazobactam (ZOSYN) IVPB 3.375 g 3.375 g 12.5 mL/hr     . carvedilol  12.5 mg Oral BID WC  . heparin  5,000 Units Subcutaneous Q8H  . insulin aspart  0-5 Units Subcutaneous QHS  . insulin aspart  0-9 Units Subcutaneous TID WC  . insulin detemir  12 Units Subcutaneous QHS  . losartan  100 mg Oral Daily  . metoCLOPramide  5 mg Oral TID AC  . simethicone  160 mg Oral BID    Objective: Vital signs in last 24 hours: Temp:  [98.5 F (36.9 C)-100.3 F (37.9 C)] 99.4 F (37.4 C) (09/13 0519) Pulse Rate:  [75-107] 75 (09/13 0519) Resp:  [16-31] 16 (09/13 0519) BP: (142-198)/(65-117) 142/69 (09/13 0519) SpO2:  [94 %-100 %] 96 % (09/13 0519) Weight:  [119.1 kg (262 lb 9.1 oz)-120.4 kg (265 lb 6.9 oz)] 119.1 kg (262 lb 9.1 oz) (09/12 1910) Constitutional: He is oriented to person, place, and time. He appears well-developed and well-nourished. No distress.  HENT:  Mouth/Throat: Oropharynx is clear and moist. No oropharyngeal exudate.  Cardiovascular: Normal rate, regular rhythm and normal heart sounds. Pulmonary/Chest: Effort normal and breath sounds normal. No respiratory  distress. He has no wheezes.  Abdominal: Soft. Bowel sounds are normal. He exhibits no distension. There is no tenderness.  Lymphadenopathy: He has no cervical adenopathy.  Neurological: He is alert and oriented to person, place, and time.  Skin: Skin is warm and dry. No rash noted. No erythema.  Psychiatric: He has a normal mood and affect. His behavior is normal.  LUE AVF wnl   Lab Results  Recent Labs  03/20/17 1709 03/21/17 0510  WBC 4.7 3.7*  HGB 12.1* 11.2*  HCT 36.0* 33.2*  NA 136 137  K 4.3 3.8  CL 95* 97*  CO2 27 28  BUN 36* 43*  CREATININE 9.45* 10.66*    Microbiology: Results for orders placed or performed during the hospital encounter of 03/20/17  Culture, blood (Routine x 2)     Status: None (Preliminary result)   Collection Time: 03/20/17  5:22 PM  Result Value Ref Range Status   Specimen Description BLOOD RIGHT ANTECUBITAL  Final   Special Requests   Final    BOTTLES DRAWN AEROBIC AND ANAEROBIC Blood Culture adequate volume   Culture  Setup Time   Final    IN BOTH AEROBIC AND ANAEROBIC BOTTLES GRAM POSITIVE COCCI CRITICAL RESULT CALLED TO, READ BACK BY AND VERIFIED WITH: SHEEMA HALLAJI 03/21/17 1256 KLW    Culture   Final    GRAM POSITIVE COCCI CULTURE REINCUBATED FOR BETTER GROWTH Performed at  [facility], [place] [address] [place], [place] [address]    Report Status PENDING  Incomplete  Blood Culture ID Panel (Reflexed)     Status: Abnormal   Collection Time: 03/20/17  5:22 PM  Result Value Ref Range Status   Enterococcus species NOT DETECTED NOT DETECTED Final   Listeria monocytogenes NOT DETECTED NOT DETECTED Final   Staphylococcus species DETECTED (A) NOT DETECTED Final    Comment: Methicillin (oxacillin) resistant coagulase negative staphylococcus. Possible blood culture contaminant (unless isolated from more than one blood culture draw or clinical case suggests pathogenicity). No antibiotic treatment is indicated for blood  culture  contaminants. CRITICAL RESULT CALLED TO, READ BACK BY AND VERIFIED WITH: SHEEMA HALLAJI 03/21/17 1256 KLW    Staphylococcus aureus NOT DETECTED NOT DETECTED Final   Methicillin resistance DETECTED (A) NOT DETECTED Final    Comment: CRITICAL RESULT CALLED TO, READ BACK BY AND VERIFIED WITH: SHEEMA HALLAJI 03/21/17 1256 KLW    Streptococcus species NOT DETECTED NOT DETECTED Final   Streptococcus agalactiae NOT DETECTED NOT DETECTED Final   Streptococcus pneumoniae NOT DETECTED NOT DETECTED Final   Streptococcus pyogenes NOT DETECTED NOT DETECTED Final   Acinetobacter baumannii NOT DETECTED NOT DETECTED Final   Enterobacteriaceae species NOT DETECTED NOT DETECTED Final   Enterobacter cloacae complex NOT DETECTED NOT DETECTED Final   Escherichia coli NOT DETECTED NOT DETECTED Final   Klebsiella oxytoca NOT DETECTED NOT DETECTED Final   Klebsiella pneumoniae NOT DETECTED NOT DETECTED Final   Proteus species NOT DETECTED NOT DETECTED Final   Serratia marcescens NOT DETECTED NOT DETECTED Final   Haemophilus influenzae NOT DETECTED NOT DETECTED Final   Neisseria meningitidis NOT DETECTED NOT DETECTED Final   Pseudomonas aeruginosa NOT DETECTED NOT DETECTED Final   Candida albicans NOT DETECTED NOT DETECTED Final   Candida glabrata NOT DETECTED NOT DETECTED Final   Candida krusei NOT DETECTED NOT DETECTED Final   Candida parapsilosis NOT DETECTED NOT DETECTED Final   Candida tropicalis NOT DETECTED NOT DETECTED Final  Culture, blood (Routine x 2)     Status: None (Preliminary result)   Collection Time: 03/20/17  5:53 PM  Result Value Ref Range Status   Specimen Description BLOOD RIGHT ANTECUBITAL  Final   Special Requests   Final    BOTTLES DRAWN AEROBIC AND ANAEROBIC Blood Culture adequate volume   Culture  Setup Time   Final    GRAM POSITIVE COCCI IN BOTH AEROBIC AND ANAEROBIC BOTTLES CRITICAL RESULT CALLED TO, READ BACK BY AND VERIFIED WITH: CHRISTINE KATSOUDAS 03/21/17 1407 KLW     Culture   Final    GRAM POSITIVE COCCI CULTURE REINCUBATED FOR BETTER GROWTH Performed at [facility], [address], [place], [place] [address]    Report Status PENDING  Incomplete  Gastrointestinal Panel by PCR , Stool     Status: None   Collection Time: 03/20/17  8:29 PM  Result Value Ref Range Status   Campylobacter species NOT DETECTED NOT DETECTED Final   Plesimonas shigelloides NOT DETECTED NOT DETECTED Final   Salmonella species NOT DETECTED NOT DETECTED Final   Yersinia enterocolitica NOT DETECTED NOT DETECTED Final   Vibrio species NOT DETECTED NOT DETECTED Final   Vibrio cholerae NOT DETECTED NOT DETECTED Final   Enteroaggregative E coli (EAEC) NOT DETECTED NOT DETECTED Final   Enteropathogenic E coli (EPEC) NOT DETECTED NOT DETECTED Final   Enterotoxigenic E coli (ETEC) NOT DETECTED NOT DETECTED Final   Shiga like toxin producing E coli (  STEC) NOT DETECTED NOT DETECTED Final   Shigella/Enteroinvasive E coli (EIEC) NOT DETECTED NOT DETECTED Final   Cryptosporidium NOT DETECTED NOT DETECTED Final   Cyclospora cayetanensis NOT DETECTED NOT DETECTED Final   Entamoeba histolytica NOT DETECTED NOT DETECTED Final   Giardia lamblia NOT DETECTED NOT DETECTED Final   Adenovirus F40/41 NOT DETECTED NOT DETECTED Final   Astrovirus NOT DETECTED NOT DETECTED Final   Norovirus GI/GII NOT DETECTED NOT DETECTED Final   Rotavirus A NOT DETECTED NOT DETECTED Final   Sapovirus (I, II, IV, and V) NOT DETECTED NOT DETECTED Final  MRSA PCR Screening     Status: None   Collection Time: 03/21/17  5:20 AM  Result Value Ref Range Status   MRSA by PCR NEGATIVE NEGATIVE Final    Comment:        The GeneXpert MRSA Assay (FDA approved for NASAL specimens only), is one component of a comprehensive MRSA colonization surveillance program. It is not intended to diagnose MRSA infection nor to guide or monitor treatment for MRSA infections.   Culture, blood (Routine X 2) w Reflex to ID  Panel     Status: None (Preliminary result)   Collection Time: 03/21/17  8:20 PM  Result Value Ref Range Status   Specimen Description BLOOD BLOOD RIGHT HAND  Final   Special Requests   Final    BOTTLES DRAWN AEROBIC AND ANAEROBIC Blood Culture adequate volume   Culture NO GROWTH < 12 HOURS  Final   Report Status PENDING  Incomplete  C difficile quick scan w PCR reflex     Status: None   Collection Time: 03/21/17  8:29 PM  Result Value Ref Range Status   C Diff antigen NEGATIVE NEGATIVE Final   C Diff toxin NEGATIVE NEGATIVE Final   C Diff interpretation No C. difficile detected.  Final  Culture, blood (Routine X 2) w Reflex to ID Panel     Status: None (Preliminary result)   Collection Time: 03/22/17  3:51 AM  Result Value Ref Range Status   Specimen Description BLOOD RFOA  Final   Special Requests   Final    BOTTLES DRAWN AEROBIC AND ANAEROBIC Blood Culture adequate volume   Culture NO GROWTH < 12 HOURS  Final   Report Status PENDING  Incomplete    Studies/Results: Dg Chest 2 View  Result Date: 03/20/2017 CLINICAL DATA:  Sepsis.  Dialysis patient EXAM: CHEST  2 VIEW COMPARISON:  09/25/2015 FINDINGS: Cardiac enlargement with vascular congestion. Negative for edema or effusion. Negative for pneumonia IMPRESSION: Pulmonary vascular congestion without edema Electronically Signed   By: Franchot Gallo M.D.   On: 03/20/2017 18:04    Assessment/Plan: Timothy Gibson is a 66 y.o. male patient with ESRD on HD through LUE AVF using buttonhole technique now with acute onset fevers, chills, nausea diarrhea. Tolerated HD Monday but sxs began Monday afternoon. BCX + 2/2 Staph species not Staph aureus. Clinically improving.  CXR neg. 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 FU BCX 9/12 and 13 ngtd. Stool C diff and PCR neg. Recommendations Continue vancomycin Pending echo  If echo neg and clears bcx would rec a 4 week course from first neg bcx.  Thank you very much for the consult. Will  follow with you.  Leonel Ramsay   03/22/2017, 11:15 AM

## 2017-03-22 NOTE — Care Management Important Message (Signed)
Important Message  Patient Details  Name: Timothy Gibson MRN: [id number] Date of Birth: [birth year]   Medicare Important Message Given:  N/A - LOS <3 / Initial given by admissions    Beverly Sessions, RN 03/22/2017, 1:47 PM

## 2017-03-22 NOTE — Progress Notes (Signed)
Pharmacy Antibiotic Note  Timothy Gibson is a 66 y.o. male admitted on 03/20/2017 with bacteremia/sepsis.  Pharmacy has been consulted for Zosyn and vancomycin dosing.  Plan: 1. Zosyn 3.375 gm IV Q12H EI 2. Vancomycin 2 gm IV x 1 last night followed by vancomycin 1 gm IV Q-dialysis (MWF). Pharmacy will continue to follow and adjust as needed to maintain trough 15 to 20 mcg/mL.   03/22/17 0946 vancomycin was not given yesterday after dialysis. Random level this morning resulted at 14 mcg/mL. Will give vancomycin 500 mg IV x 1 now and resume per protocol vancomycin/HD dosing as above.  Height: 6\' 4"  (193 cm) Weight: 262 lb 9.1 oz (119.1 kg) IBW/kg (Calculated) : 86.8  Temp (24hrs), Avg:99.6 F (37.6 C), Min:98.5 F (36.9 C), Max:100.3 F (37.9 C)   Recent Labs Lab 03/20/17 1709 03/21/17 0510 03/22/17 0946  WBC 4.7 3.7*  --   CREATININE 9.45* 10.66*  --   LATICACIDVEN 1.1  --   --   VANCORANDOM  --   --  14    Estimated Creatinine Clearance: 9.6 mL/min (A) (by C-G formula based on SCr of 10.66 mg/dL (H)).    No Known Allergies  Thank you for allowing pharmacy to be a part of this patient's care.  Laural Benes, Pharm.D., BCPS Clinical Pharmacist 03/22/2017 10:38 AM

## 2017-03-22 NOTE — Progress Notes (Signed)
Infectious Disease Long Term IV Antibiotic Orders  Diagnosis: HD associated bacteremia from L AVF site  Culture results Coag neg staph  Allergies: No Known Allergies  Discharge antibiotics Vancomycin                1000  mg  every    MWF - To be given at HD Goal vancomycin trough 15-20.    Pharmacy to adjust dosing based on levels   PICC Care per protocol Labs weekly while on IV antibiotics      CBC w diff   Comprehensive met panel Vancomycin Trough    Planned duration of antibiotics  Stop date Oct 10   Follow up clinic date week of Oct 3rd  FAX weekly labs to [phone]  Leonel Ramsay, MD

## 2017-03-22 NOTE — Progress Notes (Signed)
Pt d/c to home today. IV removed intact.  Rx's given to pt w/all questions and concerns addressed.  D/C paperwork reviewed and education provided with all questions and concerns addressed.  Pt friends at bedside for home transport.  Volunteer services contact for transportation from room to exit.

## 2017-03-22 NOTE — Progress Notes (Signed)
*  PRELIMINARY RESULTS* Echocardiogram 2D Echocardiogram has been performed.  Sherrie Sport 03/22/2017, 9:43 AM

## 2017-03-22 NOTE — Progress Notes (Signed)
[facility] [place], [place] 03/22/17  Subjective:  Pt is known to [place] from outpatient dialysis he is followed by Dr. Holley Raring at [facility].  Pt complains of fever, vomiting, poor appetite for one day. Also reports chronic diarrhea since January post colonoscopy which may be due to [facility]. He gets dialysis MWF, normal EDW is 121. Denies cough, shortness of breath, and wheezing. Tolerated HD well   Objective:  Vital signs in last 24 hours:  Temp:  [99.4 F (37.4 C)-100.3 F (37.9 C)] 99.4 F (37.4 C) (09/13 0519) Pulse Rate:  [75-107] 75 (09/13 0519) Resp:  [16-31] 16 (09/13 0519) BP: (142-198)/(65-117) 142/69 (09/13 0519) SpO2:  [94 %-100 %] 96 % (09/13 0519) Weight:  [119.1 kg (262 lb 9.1 oz)-120.4 kg (265 lb 6.9 oz)] 119.1 kg (262 lb 9.1 oz) (09/12 1910)  Weight change: 0.197 kg (6.9 oz) Filed Weights   03/20/17 1709 03/21/17 1530 03/21/17 1910  Weight: 120.2 kg (265 lb) 120.4 kg (265 lb 6.9 oz) 119.1 kg (262 lb 9.1 oz)    Intake/Output:    Intake/Output Summary (Last 24 hours) at 03/22/17 1242 Last data filed at 03/22/17 0500  Gross per 24 hour  Intake               50 ml  Output             1000 ml  Net             -950 ml     Physical Exam: General: Pt is lying in bed in NAD  HEENT Sclera anicteric  Neck No venous distention noted  Pulm/lungs Clear to auscultation bilaterally  CVS/Heart Regular rhythm without murmur   Abdomen:  Soft, non tender   Extremities: + dependent lower extremity edema  Neurologic: Alert, awake  Skin: No rashes  Access: Left forearm AVF with buttonhole       Basic Metabolic Panel:   Recent Labs Lab 03/20/17 1709 03/21/17 0510 03/21/17 1330  NA 136 137  --   K 4.3 3.8  --   CL 95* 97*  --   CO2 27 28  --   GLUCOSE 227* 240*  --   BUN 36* 43*  --   CREATININE 9.45* 10.66*  --   CALCIUM 9.2 8.7*  --   PHOS  --   --  5.1*     CBC:  Recent Labs Lab 03/20/17 1709 03/21/17 0510  WBC 4.7 3.7*   NEUTROABS 4.0  --   HGB 12.1* 11.2*  HCT 36.0* 33.2*  MCV 92.4 92.5  PLT 95* 82*     No results found for: HEPBSAG, HEPBSAB, HEPBIGM    Microbiology:  Recent Results (from the past 240 hour(s))  Culture, blood (Routine x 2)     Status: None (Preliminary result)   Collection Time: 03/20/17  5:22 PM  Result Value Ref Range Status   Specimen Description BLOOD RIGHT ANTECUBITAL  Final   Special Requests   Final    BOTTLES DRAWN AEROBIC AND ANAEROBIC Blood Culture adequate volume   Culture  Setup Time   Final    IN BOTH AEROBIC AND ANAEROBIC BOTTLES GRAM POSITIVE COCCI CRITICAL RESULT CALLED TO, READ BACK BY AND VERIFIED WITH: SHEEMA HALLAJI 03/21/17 1256 KLW    Culture   Final    GRAM POSITIVE COCCI CULTURE REINCUBATED FOR BETTER GROWTH Performed at [facility], [place] [address], [place], [place] [address]    Report Status PENDING  Incomplete  Blood Culture ID Panel (Reflexed)     Status: Abnormal   Collection Time: 03/20/17  5:22 PM  Result Value Ref Range Status   Enterococcus species NOT DETECTED NOT DETECTED Final   Listeria monocytogenes NOT DETECTED NOT DETECTED Final   Staphylococcus species DETECTED (A) NOT DETECTED Final    Comment: Methicillin (oxacillin) resistant coagulase negative staphylococcus. Possible blood culture contaminant (unless isolated from more than one blood culture draw or clinical case suggests pathogenicity). No antibiotic treatment is indicated for blood  culture contaminants. CRITICAL RESULT CALLED TO, READ BACK BY AND VERIFIED WITH: SHEEMA HALLAJI 03/21/17 1256 KLW    Staphylococcus aureus NOT DETECTED NOT DETECTED Final   Methicillin resistance DETECTED (A) NOT DETECTED Final    Comment: CRITICAL RESULT CALLED TO, READ BACK BY AND VERIFIED WITH: SHEEMA HALLAJI 03/21/17 1256 KLW    Streptococcus species NOT DETECTED NOT DETECTED Final   Streptococcus agalactiae NOT DETECTED NOT DETECTED Final   Streptococcus pneumoniae NOT DETECTED NOT  DETECTED Final   Streptococcus pyogenes NOT DETECTED NOT DETECTED Final   Acinetobacter baumannii NOT DETECTED NOT DETECTED Final   Enterobacteriaceae species NOT DETECTED NOT DETECTED Final   Enterobacter cloacae complex NOT DETECTED NOT DETECTED Final   Escherichia coli NOT DETECTED NOT DETECTED Final   Klebsiella oxytoca NOT DETECTED NOT DETECTED Final   Klebsiella pneumoniae NOT DETECTED NOT DETECTED Final   Proteus species NOT DETECTED NOT DETECTED Final   Serratia marcescens NOT DETECTED NOT DETECTED Final   Haemophilus influenzae NOT DETECTED NOT DETECTED Final   Neisseria meningitidis NOT DETECTED NOT DETECTED Final   Pseudomonas aeruginosa NOT DETECTED NOT DETECTED Final   Candida albicans NOT DETECTED NOT DETECTED Final   Candida glabrata NOT DETECTED NOT DETECTED Final   Candida krusei NOT DETECTED NOT DETECTED Final   Candida parapsilosis NOT DETECTED NOT DETECTED Final   Candida tropicalis NOT DETECTED NOT DETECTED Final  Culture, blood (Routine x 2)     Status: None (Preliminary result)   Collection Time: 03/20/17  5:53 PM  Result Value Ref Range Status   Specimen Description BLOOD RIGHT ANTECUBITAL  Final   Special Requests   Final    BOTTLES DRAWN AEROBIC AND ANAEROBIC Blood Culture adequate volume   Culture  Setup Time   Final    GRAM POSITIVE COCCI IN BOTH AEROBIC AND ANAEROBIC BOTTLES CRITICAL RESULT CALLED TO, READ BACK BY AND VERIFIED WITH: CHRISTINE KATSOUDAS 03/21/17 1407 KLW    Culture   Final    GRAM POSITIVE COCCI CULTURE REINCUBATED FOR BETTER GROWTH Performed at [facility], [address], [place], [place] [address]    Report Status PENDING  Incomplete  Gastrointestinal Panel by PCR , Stool     Status: None   Collection Time: 03/20/17  8:29 PM  Result Value Ref Range Status   Campylobacter species NOT DETECTED NOT DETECTED Final   Plesimonas shigelloides NOT DETECTED NOT DETECTED Final   Salmonella species NOT DETECTED NOT DETECTED Final    Yersinia enterocolitica NOT DETECTED NOT DETECTED Final   Vibrio species NOT DETECTED NOT DETECTED Final   Vibrio cholerae NOT DETECTED NOT DETECTED Final   Enteroaggregative E coli (EAEC) NOT DETECTED NOT DETECTED Final   Enteropathogenic E coli (EPEC) NOT DETECTED NOT DETECTED Final   Enterotoxigenic E coli (ETEC) NOT DETECTED NOT DETECTED Final   Shiga like toxin producing E coli (STEC) NOT DETECTED NOT DETECTED Final   Shigella/Enteroinvasive E coli (EIEC) NOT DETECTED NOT DETECTED Final   Cryptosporidium  NOT DETECTED NOT DETECTED Final   Cyclospora cayetanensis NOT DETECTED NOT DETECTED Final   Entamoeba histolytica NOT DETECTED NOT DETECTED Final   Giardia lamblia NOT DETECTED NOT DETECTED Final   Adenovirus F40/41 NOT DETECTED NOT DETECTED Final   Astrovirus NOT DETECTED NOT DETECTED Final   Norovirus GI/GII NOT DETECTED NOT DETECTED Final   Rotavirus A NOT DETECTED NOT DETECTED Final   Sapovirus (I, II, IV, and V) NOT DETECTED NOT DETECTED Final  MRSA PCR Screening     Status: None   Collection Time: 03/21/17  5:20 AM  Result Value Ref Range Status   MRSA by PCR NEGATIVE NEGATIVE Final    Comment:        The GeneXpert MRSA Assay (FDA approved for NASAL specimens only), is one component of a comprehensive MRSA colonization surveillance program. It is not intended to diagnose MRSA infection nor to guide or monitor treatment for MRSA infections.   Culture, blood (Routine X 2) w Reflex to ID Panel     Status: None (Preliminary result)   Collection Time: 03/21/17  8:20 PM  Result Value Ref Range Status   Specimen Description BLOOD BLOOD RIGHT HAND  Final   Special Requests   Final    BOTTLES DRAWN AEROBIC AND ANAEROBIC Blood Culture adequate volume   Culture NO GROWTH < 12 HOURS  Final   Report Status PENDING  Incomplete  C difficile quick scan w PCR reflex     Status: None   Collection Time: 03/21/17  8:29 PM  Result Value Ref Range Status   C Diff antigen NEGATIVE  NEGATIVE Final   C Diff toxin NEGATIVE NEGATIVE Final   C Diff interpretation No C. difficile detected.  Final  Culture, blood (Routine X 2) w Reflex to ID Panel     Status: None (Preliminary result)   Collection Time: 03/22/17  3:51 AM  Result Value Ref Range Status   Specimen Description BLOOD RFOA  Final   Special Requests   Final    BOTTLES DRAWN AEROBIC AND ANAEROBIC Blood Culture adequate volume   Culture NO GROWTH < 12 HOURS  Final   Report Status PENDING  Incomplete    Coagulation Studies:  Recent Labs  03/20/17 1709  LABPROT 17.3*  INR 1.43    Urinalysis: No results for input(s): COLORURINE, LABSPEC, PHURINE, GLUCOSEU, HGBUR, BILIRUBINUR, KETONESUR, PROTEINUR, UROBILINOGEN, NITRITE, LEUKOCYTESUR in the last 72 hours.  Invalid input(s): APPERANCEUR    Imaging: Dg Chest 2 View  Result Date: 03/20/2017 CLINICAL DATA:  Sepsis.  Dialysis patient EXAM: CHEST  2 VIEW COMPARISON:  09/25/2015 FINDINGS: Cardiac enlargement with vascular congestion. Negative for edema or effusion. Negative for pneumonia IMPRESSION: Pulmonary vascular congestion without edema Electronically Signed   By: Franchot Gallo M.D.   On: 03/20/2017 18:04     Medications:   . vancomycin Stopped (03/22/17 0754)   . carvedilol  12.5 mg Oral BID WC  . heparin  5,000 Units Subcutaneous Q8H  . insulin aspart  0-5 Units Subcutaneous QHS  . insulin aspart  0-9 Units Subcutaneous TID WC  . insulin detemir  12 Units Subcutaneous QHS  . losartan  100 mg Oral Daily  . metoCLOPramide  5 mg Oral TID AC  . simethicone  160 mg Oral BID   acetaminophen **OR** acetaminophen, hydrALAZINE, ondansetron **OR** ondansetron (ZOFRAN) IV, polyethylene glycol  Assessment/ Plan:  66 y.o. male with ESRD, right nephrectomy, DM, cancer, and HTN. Admitted for evaluation of fever, vomiting, and chronic diarrhea.  ESRD - HD outpatient [facility] [address] 4hrs/MWF/L forearm AVF/121 kg - Will continue dialysis while in  hospital   Hypertension, poorly controlled - continue home meds in hospital.   Secondary Hyperparathyroidism - monitor phosphorus - hold binders until GI symptoms improve  Anemia of CKD - Hb 11.2 - low dose EPO with dialysis   Fever  - MRSE bacteremia treated with vancomycin  - Outpatient antibiotics ordered - stop date oct 10   LOS: 2 [facility] 9/13/201812:42 PM  [facility] [place], [place]

## 2017-03-22 NOTE — Care Management (Signed)
[facility] HD liaison notified of discharge, and order for Antibiotics at HD.

## 2017-03-23 DIAGNOSIS — Z23 Encounter for immunization: Secondary | ICD-10-CM | POA: Diagnosis not present

## 2017-03-23 DIAGNOSIS — D509 Iron deficiency anemia, unspecified: Secondary | ICD-10-CM | POA: Diagnosis not present

## 2017-03-23 DIAGNOSIS — N186 End stage renal disease: Secondary | ICD-10-CM | POA: Diagnosis not present

## 2017-03-23 DIAGNOSIS — D631 Anemia in chronic kidney disease: Secondary | ICD-10-CM | POA: Diagnosis not present

## 2017-03-23 DIAGNOSIS — R7881 Bacteremia: Secondary | ICD-10-CM | POA: Diagnosis not present

## 2017-03-23 DIAGNOSIS — Z5181 Encounter for therapeutic drug level monitoring: Secondary | ICD-10-CM | POA: Diagnosis not present

## 2017-03-23 LAB — HEPATITIS B SURFACE ANTIGEN: Hepatitis B Surface Ag: NEGATIVE

## 2017-03-24 LAB — CULTURE, BLOOD (ROUTINE X 2)
SPECIAL REQUESTS: ADEQUATE
SPECIAL REQUESTS: ADEQUATE

## 2017-03-26 DIAGNOSIS — Z23 Encounter for immunization: Secondary | ICD-10-CM | POA: Diagnosis not present

## 2017-03-26 DIAGNOSIS — R7881 Bacteremia: Secondary | ICD-10-CM | POA: Diagnosis not present

## 2017-03-26 DIAGNOSIS — Z5181 Encounter for therapeutic drug level monitoring: Secondary | ICD-10-CM | POA: Diagnosis not present

## 2017-03-26 DIAGNOSIS — D509 Iron deficiency anemia, unspecified: Secondary | ICD-10-CM | POA: Diagnosis not present

## 2017-03-26 DIAGNOSIS — D631 Anemia in chronic kidney disease: Secondary | ICD-10-CM | POA: Diagnosis not present

## 2017-03-26 DIAGNOSIS — N186 End stage renal disease: Secondary | ICD-10-CM | POA: Diagnosis not present

## 2017-03-26 LAB — CULTURE, BLOOD (ROUTINE X 2)
Culture: NO GROWTH
Special Requests: ADEQUATE

## 2017-03-27 LAB — CULTURE, BLOOD (ROUTINE X 2)
Culture: NO GROWTH
Special Requests: ADEQUATE

## 2017-03-28 DIAGNOSIS — Z5181 Encounter for therapeutic drug level monitoring: Secondary | ICD-10-CM | POA: Diagnosis not present

## 2017-03-28 DIAGNOSIS — D509 Iron deficiency anemia, unspecified: Secondary | ICD-10-CM | POA: Diagnosis not present

## 2017-03-28 DIAGNOSIS — R7881 Bacteremia: Secondary | ICD-10-CM | POA: Diagnosis not present

## 2017-03-28 DIAGNOSIS — N186 End stage renal disease: Secondary | ICD-10-CM | POA: Diagnosis not present

## 2017-03-28 DIAGNOSIS — Z23 Encounter for immunization: Secondary | ICD-10-CM | POA: Diagnosis not present

## 2017-03-28 DIAGNOSIS — D631 Anemia in chronic kidney disease: Secondary | ICD-10-CM | POA: Diagnosis not present

## 2017-03-29 DIAGNOSIS — Z794 Long term (current) use of insulin: Secondary | ICD-10-CM | POA: Diagnosis not present

## 2017-03-29 DIAGNOSIS — E1140 Type 2 diabetes mellitus with diabetic neuropathy, unspecified: Secondary | ICD-10-CM | POA: Diagnosis not present

## 2017-03-29 DIAGNOSIS — B351 Tinea unguium: Secondary | ICD-10-CM | POA: Diagnosis not present

## 2017-03-30 DIAGNOSIS — Z5181 Encounter for therapeutic drug level monitoring: Secondary | ICD-10-CM | POA: Diagnosis not present

## 2017-03-30 DIAGNOSIS — Z23 Encounter for immunization: Secondary | ICD-10-CM | POA: Diagnosis not present

## 2017-03-30 DIAGNOSIS — N186 End stage renal disease: Secondary | ICD-10-CM | POA: Diagnosis not present

## 2017-03-30 DIAGNOSIS — D509 Iron deficiency anemia, unspecified: Secondary | ICD-10-CM | POA: Diagnosis not present

## 2017-03-30 DIAGNOSIS — D631 Anemia in chronic kidney disease: Secondary | ICD-10-CM | POA: Diagnosis not present

## 2017-03-30 DIAGNOSIS — R7881 Bacteremia: Secondary | ICD-10-CM | POA: Diagnosis not present

## 2017-04-02 DIAGNOSIS — Z5181 Encounter for therapeutic drug level monitoring: Secondary | ICD-10-CM | POA: Diagnosis not present

## 2017-04-02 DIAGNOSIS — D631 Anemia in chronic kidney disease: Secondary | ICD-10-CM | POA: Diagnosis not present

## 2017-04-02 DIAGNOSIS — R7881 Bacteremia: Secondary | ICD-10-CM | POA: Diagnosis not present

## 2017-04-02 DIAGNOSIS — D509 Iron deficiency anemia, unspecified: Secondary | ICD-10-CM | POA: Diagnosis not present

## 2017-04-02 DIAGNOSIS — N186 End stage renal disease: Secondary | ICD-10-CM | POA: Diagnosis not present

## 2017-04-02 DIAGNOSIS — Z23 Encounter for immunization: Secondary | ICD-10-CM | POA: Diagnosis not present

## 2017-04-04 DIAGNOSIS — Z5181 Encounter for therapeutic drug level monitoring: Secondary | ICD-10-CM | POA: Diagnosis not present

## 2017-04-04 DIAGNOSIS — N186 End stage renal disease: Secondary | ICD-10-CM | POA: Diagnosis not present

## 2017-04-04 DIAGNOSIS — D509 Iron deficiency anemia, unspecified: Secondary | ICD-10-CM | POA: Diagnosis not present

## 2017-04-04 DIAGNOSIS — Z23 Encounter for immunization: Secondary | ICD-10-CM | POA: Diagnosis not present

## 2017-04-04 DIAGNOSIS — R7881 Bacteremia: Secondary | ICD-10-CM | POA: Diagnosis not present

## 2017-04-04 DIAGNOSIS — D631 Anemia in chronic kidney disease: Secondary | ICD-10-CM | POA: Diagnosis not present

## 2017-04-05 DIAGNOSIS — N186 End stage renal disease: Secondary | ICD-10-CM | POA: Diagnosis not present

## 2017-04-05 DIAGNOSIS — I129 Hypertensive chronic kidney disease with stage 1 through stage 4 chronic kidney disease, or unspecified chronic kidney disease: Secondary | ICD-10-CM | POA: Diagnosis not present

## 2017-04-06 DIAGNOSIS — D509 Iron deficiency anemia, unspecified: Secondary | ICD-10-CM | POA: Diagnosis not present

## 2017-04-06 DIAGNOSIS — N186 End stage renal disease: Secondary | ICD-10-CM | POA: Diagnosis not present

## 2017-04-06 DIAGNOSIS — R7881 Bacteremia: Secondary | ICD-10-CM | POA: Diagnosis not present

## 2017-04-06 DIAGNOSIS — Z5181 Encounter for therapeutic drug level monitoring: Secondary | ICD-10-CM | POA: Diagnosis not present

## 2017-04-06 DIAGNOSIS — D631 Anemia in chronic kidney disease: Secondary | ICD-10-CM | POA: Diagnosis not present

## 2017-04-06 DIAGNOSIS — Z23 Encounter for immunization: Secondary | ICD-10-CM | POA: Diagnosis not present

## 2017-04-08 DIAGNOSIS — N186 End stage renal disease: Secondary | ICD-10-CM | POA: Diagnosis not present

## 2017-04-08 DIAGNOSIS — Z992 Dependence on renal dialysis: Secondary | ICD-10-CM | POA: Diagnosis not present

## 2017-04-09 DIAGNOSIS — Z5181 Encounter for therapeutic drug level monitoring: Secondary | ICD-10-CM | POA: Diagnosis not present

## 2017-04-09 DIAGNOSIS — Z992 Dependence on renal dialysis: Secondary | ICD-10-CM | POA: Diagnosis not present

## 2017-04-09 DIAGNOSIS — D631 Anemia in chronic kidney disease: Secondary | ICD-10-CM | POA: Diagnosis not present

## 2017-04-09 DIAGNOSIS — R7881 Bacteremia: Secondary | ICD-10-CM | POA: Diagnosis not present

## 2017-04-09 DIAGNOSIS — D509 Iron deficiency anemia, unspecified: Secondary | ICD-10-CM | POA: Diagnosis not present

## 2017-04-09 DIAGNOSIS — N2581 Secondary hyperparathyroidism of renal origin: Secondary | ICD-10-CM | POA: Diagnosis not present

## 2017-04-09 DIAGNOSIS — Z1611 Resistance to penicillins: Secondary | ICD-10-CM | POA: Diagnosis not present

## 2017-04-09 DIAGNOSIS — N186 End stage renal disease: Secondary | ICD-10-CM | POA: Diagnosis not present

## 2017-04-09 DIAGNOSIS — B957 Other staphylococcus as the cause of diseases classified elsewhere: Secondary | ICD-10-CM | POA: Diagnosis not present

## 2017-04-11 DIAGNOSIS — D631 Anemia in chronic kidney disease: Secondary | ICD-10-CM | POA: Diagnosis not present

## 2017-04-11 DIAGNOSIS — R7881 Bacteremia: Secondary | ICD-10-CM | POA: Diagnosis not present

## 2017-04-11 DIAGNOSIS — N186 End stage renal disease: Secondary | ICD-10-CM | POA: Diagnosis not present

## 2017-04-11 DIAGNOSIS — Z5181 Encounter for therapeutic drug level monitoring: Secondary | ICD-10-CM | POA: Diagnosis not present

## 2017-04-11 DIAGNOSIS — D509 Iron deficiency anemia, unspecified: Secondary | ICD-10-CM | POA: Diagnosis not present

## 2017-04-11 DIAGNOSIS — N2581 Secondary hyperparathyroidism of renal origin: Secondary | ICD-10-CM | POA: Diagnosis not present

## 2017-04-13 DIAGNOSIS — Z5181 Encounter for therapeutic drug level monitoring: Secondary | ICD-10-CM | POA: Diagnosis not present

## 2017-04-13 DIAGNOSIS — R7881 Bacteremia: Secondary | ICD-10-CM | POA: Diagnosis not present

## 2017-04-13 DIAGNOSIS — N2581 Secondary hyperparathyroidism of renal origin: Secondary | ICD-10-CM | POA: Diagnosis not present

## 2017-04-13 DIAGNOSIS — D509 Iron deficiency anemia, unspecified: Secondary | ICD-10-CM | POA: Diagnosis not present

## 2017-04-13 DIAGNOSIS — N186 End stage renal disease: Secondary | ICD-10-CM | POA: Diagnosis not present

## 2017-04-13 DIAGNOSIS — D631 Anemia in chronic kidney disease: Secondary | ICD-10-CM | POA: Diagnosis not present

## 2017-04-16 DIAGNOSIS — Z992 Dependence on renal dialysis: Secondary | ICD-10-CM | POA: Diagnosis not present

## 2017-04-16 DIAGNOSIS — N2581 Secondary hyperparathyroidism of renal origin: Secondary | ICD-10-CM | POA: Diagnosis not present

## 2017-04-16 DIAGNOSIS — R7881 Bacteremia: Secondary | ICD-10-CM | POA: Diagnosis not present

## 2017-04-16 DIAGNOSIS — D631 Anemia in chronic kidney disease: Secondary | ICD-10-CM | POA: Diagnosis not present

## 2017-04-16 DIAGNOSIS — Z5181 Encounter for therapeutic drug level monitoring: Secondary | ICD-10-CM | POA: Diagnosis not present

## 2017-04-16 DIAGNOSIS — D509 Iron deficiency anemia, unspecified: Secondary | ICD-10-CM | POA: Diagnosis not present

## 2017-04-16 DIAGNOSIS — E119 Type 2 diabetes mellitus without complications: Secondary | ICD-10-CM | POA: Diagnosis not present

## 2017-04-16 DIAGNOSIS — N186 End stage renal disease: Secondary | ICD-10-CM | POA: Diagnosis not present

## 2017-04-18 DIAGNOSIS — R7881 Bacteremia: Secondary | ICD-10-CM | POA: Diagnosis not present

## 2017-04-18 DIAGNOSIS — D631 Anemia in chronic kidney disease: Secondary | ICD-10-CM | POA: Diagnosis not present

## 2017-04-18 DIAGNOSIS — Z5181 Encounter for therapeutic drug level monitoring: Secondary | ICD-10-CM | POA: Diagnosis not present

## 2017-04-18 DIAGNOSIS — N2581 Secondary hyperparathyroidism of renal origin: Secondary | ICD-10-CM | POA: Diagnosis not present

## 2017-04-18 DIAGNOSIS — D509 Iron deficiency anemia, unspecified: Secondary | ICD-10-CM | POA: Diagnosis not present

## 2017-04-18 DIAGNOSIS — N186 End stage renal disease: Secondary | ICD-10-CM | POA: Diagnosis not present

## 2017-04-20 DIAGNOSIS — D631 Anemia in chronic kidney disease: Secondary | ICD-10-CM | POA: Diagnosis not present

## 2017-04-20 DIAGNOSIS — Z5181 Encounter for therapeutic drug level monitoring: Secondary | ICD-10-CM | POA: Diagnosis not present

## 2017-04-20 DIAGNOSIS — R7881 Bacteremia: Secondary | ICD-10-CM | POA: Diagnosis not present

## 2017-04-20 DIAGNOSIS — D509 Iron deficiency anemia, unspecified: Secondary | ICD-10-CM | POA: Diagnosis not present

## 2017-04-20 DIAGNOSIS — N2581 Secondary hyperparathyroidism of renal origin: Secondary | ICD-10-CM | POA: Diagnosis not present

## 2017-04-20 DIAGNOSIS — N186 End stage renal disease: Secondary | ICD-10-CM | POA: Diagnosis not present

## 2017-04-23 DIAGNOSIS — D631 Anemia in chronic kidney disease: Secondary | ICD-10-CM | POA: Diagnosis not present

## 2017-04-23 DIAGNOSIS — N186 End stage renal disease: Secondary | ICD-10-CM | POA: Diagnosis not present

## 2017-04-23 DIAGNOSIS — D509 Iron deficiency anemia, unspecified: Secondary | ICD-10-CM | POA: Diagnosis not present

## 2017-04-23 DIAGNOSIS — Z5181 Encounter for therapeutic drug level monitoring: Secondary | ICD-10-CM | POA: Diagnosis not present

## 2017-04-23 DIAGNOSIS — N2581 Secondary hyperparathyroidism of renal origin: Secondary | ICD-10-CM | POA: Diagnosis not present

## 2017-04-23 DIAGNOSIS — R7881 Bacteremia: Secondary | ICD-10-CM | POA: Diagnosis not present

## 2017-04-25 DIAGNOSIS — R7881 Bacteremia: Secondary | ICD-10-CM | POA: Diagnosis not present

## 2017-04-25 DIAGNOSIS — Z5181 Encounter for therapeutic drug level monitoring: Secondary | ICD-10-CM | POA: Diagnosis not present

## 2017-04-25 DIAGNOSIS — N186 End stage renal disease: Secondary | ICD-10-CM | POA: Diagnosis not present

## 2017-04-25 DIAGNOSIS — D631 Anemia in chronic kidney disease: Secondary | ICD-10-CM | POA: Diagnosis not present

## 2017-04-25 DIAGNOSIS — D509 Iron deficiency anemia, unspecified: Secondary | ICD-10-CM | POA: Diagnosis not present

## 2017-04-25 DIAGNOSIS — N2581 Secondary hyperparathyroidism of renal origin: Secondary | ICD-10-CM | POA: Diagnosis not present

## 2017-04-27 DIAGNOSIS — D509 Iron deficiency anemia, unspecified: Secondary | ICD-10-CM | POA: Diagnosis not present

## 2017-04-27 DIAGNOSIS — N2581 Secondary hyperparathyroidism of renal origin: Secondary | ICD-10-CM | POA: Diagnosis not present

## 2017-04-27 DIAGNOSIS — Z5181 Encounter for therapeutic drug level monitoring: Secondary | ICD-10-CM | POA: Diagnosis not present

## 2017-04-27 DIAGNOSIS — D631 Anemia in chronic kidney disease: Secondary | ICD-10-CM | POA: Diagnosis not present

## 2017-04-27 DIAGNOSIS — R7881 Bacteremia: Secondary | ICD-10-CM | POA: Diagnosis not present

## 2017-04-27 DIAGNOSIS — N186 End stage renal disease: Secondary | ICD-10-CM | POA: Diagnosis not present

## 2017-04-30 DIAGNOSIS — D631 Anemia in chronic kidney disease: Secondary | ICD-10-CM | POA: Diagnosis not present

## 2017-04-30 DIAGNOSIS — N186 End stage renal disease: Secondary | ICD-10-CM | POA: Diagnosis not present

## 2017-04-30 DIAGNOSIS — D509 Iron deficiency anemia, unspecified: Secondary | ICD-10-CM | POA: Diagnosis not present

## 2017-04-30 DIAGNOSIS — Z5181 Encounter for therapeutic drug level monitoring: Secondary | ICD-10-CM | POA: Diagnosis not present

## 2017-04-30 DIAGNOSIS — R7881 Bacteremia: Secondary | ICD-10-CM | POA: Diagnosis not present

## 2017-04-30 DIAGNOSIS — N2581 Secondary hyperparathyroidism of renal origin: Secondary | ICD-10-CM | POA: Diagnosis not present

## 2017-05-02 DIAGNOSIS — N2581 Secondary hyperparathyroidism of renal origin: Secondary | ICD-10-CM | POA: Diagnosis not present

## 2017-05-02 DIAGNOSIS — R7881 Bacteremia: Secondary | ICD-10-CM | POA: Diagnosis not present

## 2017-05-02 DIAGNOSIS — D509 Iron deficiency anemia, unspecified: Secondary | ICD-10-CM | POA: Diagnosis not present

## 2017-05-02 DIAGNOSIS — D631 Anemia in chronic kidney disease: Secondary | ICD-10-CM | POA: Diagnosis not present

## 2017-05-02 DIAGNOSIS — Z5181 Encounter for therapeutic drug level monitoring: Secondary | ICD-10-CM | POA: Diagnosis not present

## 2017-05-02 DIAGNOSIS — N186 End stage renal disease: Secondary | ICD-10-CM | POA: Diagnosis not present

## 2017-05-04 DIAGNOSIS — N186 End stage renal disease: Secondary | ICD-10-CM | POA: Diagnosis not present

## 2017-05-04 DIAGNOSIS — D509 Iron deficiency anemia, unspecified: Secondary | ICD-10-CM | POA: Diagnosis not present

## 2017-05-04 DIAGNOSIS — D631 Anemia in chronic kidney disease: Secondary | ICD-10-CM | POA: Diagnosis not present

## 2017-05-04 DIAGNOSIS — R7881 Bacteremia: Secondary | ICD-10-CM | POA: Diagnosis not present

## 2017-05-04 DIAGNOSIS — N2581 Secondary hyperparathyroidism of renal origin: Secondary | ICD-10-CM | POA: Diagnosis not present

## 2017-05-04 DIAGNOSIS — Z5181 Encounter for therapeutic drug level monitoring: Secondary | ICD-10-CM | POA: Diagnosis not present

## 2017-05-07 DIAGNOSIS — Z5181 Encounter for therapeutic drug level monitoring: Secondary | ICD-10-CM | POA: Diagnosis not present

## 2017-05-07 DIAGNOSIS — R7881 Bacteremia: Secondary | ICD-10-CM | POA: Diagnosis not present

## 2017-05-07 DIAGNOSIS — N186 End stage renal disease: Secondary | ICD-10-CM | POA: Diagnosis not present

## 2017-05-07 DIAGNOSIS — N2581 Secondary hyperparathyroidism of renal origin: Secondary | ICD-10-CM | POA: Diagnosis not present

## 2017-05-07 DIAGNOSIS — D509 Iron deficiency anemia, unspecified: Secondary | ICD-10-CM | POA: Diagnosis not present

## 2017-05-07 DIAGNOSIS — D631 Anemia in chronic kidney disease: Secondary | ICD-10-CM | POA: Diagnosis not present

## 2017-05-09 DIAGNOSIS — N186 End stage renal disease: Secondary | ICD-10-CM | POA: Diagnosis not present

## 2017-05-09 DIAGNOSIS — N2581 Secondary hyperparathyroidism of renal origin: Secondary | ICD-10-CM | POA: Diagnosis not present

## 2017-05-09 DIAGNOSIS — D631 Anemia in chronic kidney disease: Secondary | ICD-10-CM | POA: Diagnosis not present

## 2017-05-09 DIAGNOSIS — Z5181 Encounter for therapeutic drug level monitoring: Secondary | ICD-10-CM | POA: Diagnosis not present

## 2017-05-09 DIAGNOSIS — R7881 Bacteremia: Secondary | ICD-10-CM | POA: Diagnosis not present

## 2017-05-09 DIAGNOSIS — Z992 Dependence on renal dialysis: Secondary | ICD-10-CM | POA: Diagnosis not present

## 2017-05-09 DIAGNOSIS — D509 Iron deficiency anemia, unspecified: Secondary | ICD-10-CM | POA: Diagnosis not present

## 2017-05-11 DIAGNOSIS — N186 End stage renal disease: Secondary | ICD-10-CM | POA: Diagnosis not present

## 2017-05-11 DIAGNOSIS — D631 Anemia in chronic kidney disease: Secondary | ICD-10-CM | POA: Diagnosis not present

## 2017-05-11 DIAGNOSIS — Z992 Dependence on renal dialysis: Secondary | ICD-10-CM | POA: Diagnosis not present

## 2017-05-11 DIAGNOSIS — D509 Iron deficiency anemia, unspecified: Secondary | ICD-10-CM | POA: Diagnosis not present

## 2017-05-11 DIAGNOSIS — N2581 Secondary hyperparathyroidism of renal origin: Secondary | ICD-10-CM | POA: Diagnosis not present

## 2017-05-14 DIAGNOSIS — N2581 Secondary hyperparathyroidism of renal origin: Secondary | ICD-10-CM | POA: Diagnosis not present

## 2017-05-14 DIAGNOSIS — N186 End stage renal disease: Secondary | ICD-10-CM | POA: Diagnosis not present

## 2017-05-14 DIAGNOSIS — D631 Anemia in chronic kidney disease: Secondary | ICD-10-CM | POA: Diagnosis not present

## 2017-05-14 DIAGNOSIS — D509 Iron deficiency anemia, unspecified: Secondary | ICD-10-CM | POA: Diagnosis not present

## 2017-05-14 DIAGNOSIS — Z992 Dependence on renal dialysis: Secondary | ICD-10-CM | POA: Diagnosis not present

## 2017-05-16 ENCOUNTER — Encounter: Payer: Self-pay | Admitting: Emergency Medicine

## 2017-05-16 ENCOUNTER — Other Ambulatory Visit: Payer: Self-pay

## 2017-05-16 ENCOUNTER — Emergency Department
Admission: EM | Admit: 2017-05-16 | Discharge: 2017-05-16 | Disposition: A | Discharge status: Home / Self Care | Payer: Medicare Other | Attending: Emergency Medicine | Admitting: Emergency Medicine

## 2017-05-16 ENCOUNTER — Emergency Department: Payer: Medicare Other

## 2017-05-16 DIAGNOSIS — Z992 Dependence on renal dialysis: Secondary | ICD-10-CM | POA: Diagnosis not present

## 2017-05-16 DIAGNOSIS — R109 Unspecified abdominal pain: Secondary | ICD-10-CM | POA: Diagnosis not present

## 2017-05-16 DIAGNOSIS — I120 Hypertensive chronic kidney disease with stage 5 chronic kidney disease or end stage renal disease: Secondary | ICD-10-CM | POA: Diagnosis not present

## 2017-05-16 DIAGNOSIS — E119 Type 2 diabetes mellitus without complications: Secondary | ICD-10-CM | POA: Diagnosis not present

## 2017-05-16 DIAGNOSIS — M545 Low back pain, unspecified: Secondary | ICD-10-CM

## 2017-05-16 DIAGNOSIS — R1030 Lower abdominal pain, unspecified: Secondary | ICD-10-CM

## 2017-05-16 DIAGNOSIS — Z87891 Personal history of nicotine dependence: Secondary | ICD-10-CM | POA: Insufficient documentation

## 2017-05-16 DIAGNOSIS — N186 End stage renal disease: Secondary | ICD-10-CM | POA: Diagnosis not present

## 2017-05-16 DIAGNOSIS — Z79899 Other long term (current) drug therapy: Secondary | ICD-10-CM | POA: Insufficient documentation

## 2017-05-16 LAB — BASIC METABOLIC PANEL
ANION GAP: 11 (ref 5–15)
BUN: 36 mg/dL — ABNORMAL HIGH (ref 6–20)
CALCIUM: 8.7 mg/dL — AB (ref 8.9–10.3)
CO2: 29 mmol/L (ref 22–32)
Chloride: 93 mmol/L — ABNORMAL LOW (ref 101–111)
Creatinine, Ser: 9.73 mg/dL — ABNORMAL HIGH (ref 0.61–1.24)
GFR, EST AFRICAN AMERICAN: 6 mL/min — AB (ref >60)
GFR, EST NON AFRICAN AMERICAN: 5 mL/min — AB (ref >60)
GLUCOSE: 235 mg/dL — AB (ref 65–99)
POTASSIUM: 4.3 mmol/L (ref 3.5–5.1)
Sodium: 133 mmol/L — ABNORMAL LOW (ref 135–145)

## 2017-05-16 LAB — CBC
HCT: 30.8 % — ABNORMAL LOW (ref 40.0–52.0)
Hemoglobin: 10.1 g/dL — ABNORMAL LOW (ref 13.0–18.0)
MCH: 29.9 pg (ref 26.0–34.0)
MCHC: 32.9 g/dL (ref 32.0–36.0)
MCV: 91.1 fL (ref 80.0–100.0)
PLATELETS: 116 10*3/uL — AB (ref 150–440)
RBC: 3.38 MIL/uL — AB (ref 4.40–5.90)
RDW: 16.5 % — AB (ref 11.5–14.5)
WBC: 2.8 10*3/uL — AB (ref 3.8–10.6)

## 2017-05-16 MED ORDER — IOPAMIDOL (ISOVUE-300) INJECTION 61%
15.0000 mL | INTRAVENOUS | Status: AC
  Administered 2017-05-16 (×2): 15 mL via ORAL

## 2017-05-16 MED ORDER — HYDROCODONE-ACETAMINOPHEN 5-325 MG PO TABS: 1.0000 | ORAL_TABLET | ORAL | 0 refills | Status: DC | PRN

## 2017-05-16 MED ORDER — DOCUSATE SODIUM 100 MG PO CAPS: 100.0000 mg | ORAL_CAPSULE | Freq: Two times a day (BID) | ORAL | 0 refills | Status: AC

## 2017-05-16 NOTE — ED Notes (Signed)
Patient reports bilateral flank pain that radiates into his lower abdomen. Patient is a dialysis patient. Is suppose to go today.

## 2017-05-16 NOTE — ED Provider Notes (Signed)
[facility] Emergency Department Provider Note  Time seen: 10:28 AM  I have reviewed the triage vital signs and the nursing notes.   HISTORY  Chief Complaint Back Pain    HPI Timothy Gibson is a 66 y.o. male with a past medical history of diabetes, end-stage renal disease on dialysis Monday, Wednesday, Friday, hypertension, presents to the emergency department for lower abdominal pain and lower back pain.  According to the patient for the past 5 or 6 days he has been experiencing persistent lower abdominal pain which radiates into his lower back.  States the pain is worse if he rolls over or sits up.  Somewhat relieved after standing.  Denies any diarrhea, black or bloody stool.  States occasional nausea with occasional vomiting episodes.  Denies any known fever.  Describes his abdominal pain is moderate.  Past Medical History:  Diagnosis Date  . Cancer ([place])    right kidney  . Diabetes mellitus without complication ([place])   . Dialysis patient [facility])    on Mon-Wed-Fri dialysis  . ESRD (end stage renal disease) ([place])   . History of gangrene   . Hypertension     Patient Active Problem List   Diagnosis Date Noted  . Fever 03/20/2017  . Chest pain 09/25/2015    Past Surgical History:  Procedure Laterality Date  . KNEE SURGERY    . left forearm AV fistula    . NEPHRECTOMY      Prior to Admission medications   Medication Sig Start Date End Date Taking? Authorizing Provider  carvedilol (COREG) 12.5 MG tablet Take 1 tablet (12.5 mg total) by mouth 2 (two) times daily with a meal. 03/22/17   Gladstone Lighter, MD  Ferric Citrate (AURYXIA) 1 GM 210 MG(Fe) TABS Take 3 tablets by mouth 2 (two) times daily with a meal.    [provider]  LEVEMIR FLEXTOUCH 100 UNIT/ML Pen Inject 6-13 Units into the skin at bedtime.  07/10/15   [provider]  lidocaine-prilocaine (EMLA) cream Apply 1 application topically every Monday, Wednesday, and Friday.  Apply 2 hours before dialysis. 05/23/10   [provider]  losartan (COZAAR) 100 MG tablet Take 100 mg by mouth daily. 01/05/17   [provider]  metoCLOPramide (REGLAN) 5 MG tablet Take 1 tablet (5 mg total) by mouth 3 (three) times daily before meals. 03/22/17   Gladstone Lighter, MD  sevelamer carbonate (RENVELA) 800 MG tablet Take 1,600 mg by mouth 2 (two) times daily with a meal.     [provider]  vancomycin (VANCOCIN) 1-5 GM/200ML-% SOLN Inject 200 mLs (1,000 mg total) into the vein every Monday, Wednesday, and Friday with hemodialysis. X 4 weeks 03/23/17   Gladstone Lighter, MD    No Known Allergies  Family History  Problem Relation Age of Onset  . Hypertension Father     Social History Social History   Tobacco Use  . Smoking status: Former Research scientist (life sciences)  . Smokeless tobacco: Never Used  . Tobacco comment: cigar smoker  Substance Use Topics  . Alcohol use: No  . Drug use: No    Review of Systems Constitutional: Negative for fever Cardiovascular: Negative for chest pain. Respiratory: Negative for shortness of breath. Gastrointestinal: Lower abdominal pain.  Positive for nausea and vomiting.  Negative for diarrhea. Genitourinary: Negative for dysuria, makes very little urine per patient. Musculoskeletal: Negative for back pain. Neurological: Negative for headache All other ROS negative  ____________________________________________   PHYSICAL EXAM:  VITAL SIGNS: ED  Triage Vitals  Enc Vitals Group     BP 05/16/17 0844 (!) 186/86     Pulse Rate 05/16/17 0844 82     Resp 05/16/17 0844 18     Temp 05/16/17 0844 97.7 F (36.5 C)     Temp Source 05/16/17 0844 Oral     SpO2 05/16/17 0844 100 %     Weight 05/16/17 0845 265 lb (120.2 kg)     Height 05/16/17 0845 6\' 4"  (1.93 m)     Head Circumference --      Peak Flow --      Pain Score 05/16/17 0857 2     Pain Loc --      Pain Edu? --      Excl. in [place]? --     Constitutional: Alert and  oriented. Well appearing and in no distress. Eyes: Normal exam ENT   Head: Normocephalic and atraumatic.   Mouth/Throat: Mucous membranes are moist. Cardiovascular: Normal rate, regular rhythm. No murmur Respiratory: Normal respiratory effort without tachypnea nor retractions. Breath sounds are clear Gastrointestinal: Soft and nontender. No distention.  Completely nontender abdominal exam.  No suprapubic tenderness. Musculoskeletal: Nontender with normal range of motion in all extremities.  Moderate lower extremity edema equal bilaterally.  Mild lumbar paraspinal tenderness to palpation. Neurologic:  Normal speech and language. No gross focal neurologic deficits Skin:  Skin is warm, dry and intact.  Psychiatric: Mood and affect are normal  ____________________________________________   RADIOLOGY  CT shows no acute findings in abdomen or pelvis.  ____________________________________________   INITIAL IMPRESSION / ASSESSMENT AND PLAN / ED COURSE  Pertinent labs & imaging results that were available during my care of the patient were reviewed by me and considered in my medical decision making (see chart for details).  Patient presents to the emergency department for lower abdominal pain radiating to his lower back.  Does state the pain is worse when he rolls over or sits up.  States nausea one episode of vomiting.  Denies diarrhea, black or bloody stool.  Differential would include diverticulitis, colitis, musculoskeletal pain, lumbar pain/strain.  We will check labs, obtain a CT scan without IV contrast as the patient does create some urine.  Patient agreeable to this plan of care.  I reviewed the patient's records including recent discharge summary 03/22/17 after an admission for fever/diarrhea.  Patient's labs are largely at baseline.  CT scan of the abdomen is negative.  Patient states the pain starts in his back and radiates to his abdomen and sometimes starts in the abdomen  and radiates to the back.  Worse with movement.  I suspect there is likely a degree of musculoskeletal pain.  With a normal CT and baseline labs I believe the patient is safe for discharge home with PCP follow-up.  Patient's potassium is 4.3 today with a BUN of 36 I believe the patient is safe to wait until Friday for his next dialysis session.  Patient agreeable with this plan of care.  ____________________________________________   FINAL CLINICAL IMPRESSION(S) / ED DIAGNOSES  Lower abdominal pain Lower back pain    Harvest Dark, MD 05/16/17 1321

## 2017-05-16 NOTE — ED Notes (Signed)
CT notified that patient done with contrast.

## 2017-05-16 NOTE — ED Notes (Signed)
Pt returned from CT at this time.  

## 2017-05-16 NOTE — ED Triage Notes (Signed)
Pt here with bilateral flank pain radiating to lower abdomen, dialysis M,W,F, has not gone today, states lower back pain worsens with movement, states when he moves the pain is severe. Pt has one kidney. NAD.

## 2017-05-17 DIAGNOSIS — T82858A Stenosis of vascular prosthetic devices, implants and grafts, initial encounter: Secondary | ICD-10-CM | POA: Diagnosis not present

## 2017-05-17 DIAGNOSIS — Z992 Dependence on renal dialysis: Secondary | ICD-10-CM | POA: Diagnosis not present

## 2017-05-17 DIAGNOSIS — I871 Compression of vein: Secondary | ICD-10-CM | POA: Diagnosis not present

## 2017-05-17 DIAGNOSIS — N186 End stage renal disease: Secondary | ICD-10-CM | POA: Diagnosis not present

## 2017-05-18 DIAGNOSIS — N2581 Secondary hyperparathyroidism of renal origin: Secondary | ICD-10-CM | POA: Diagnosis not present

## 2017-05-18 DIAGNOSIS — Z992 Dependence on renal dialysis: Secondary | ICD-10-CM | POA: Diagnosis not present

## 2017-05-18 DIAGNOSIS — N186 End stage renal disease: Secondary | ICD-10-CM | POA: Diagnosis not present

## 2017-05-18 DIAGNOSIS — D631 Anemia in chronic kidney disease: Secondary | ICD-10-CM | POA: Diagnosis not present

## 2017-05-18 DIAGNOSIS — D509 Iron deficiency anemia, unspecified: Secondary | ICD-10-CM | POA: Diagnosis not present

## 2017-05-21 DIAGNOSIS — D631 Anemia in chronic kidney disease: Secondary | ICD-10-CM | POA: Diagnosis not present

## 2017-05-21 DIAGNOSIS — D509 Iron deficiency anemia, unspecified: Secondary | ICD-10-CM | POA: Diagnosis not present

## 2017-05-21 DIAGNOSIS — N2581 Secondary hyperparathyroidism of renal origin: Secondary | ICD-10-CM | POA: Diagnosis not present

## 2017-05-21 DIAGNOSIS — Z992 Dependence on renal dialysis: Secondary | ICD-10-CM | POA: Diagnosis not present

## 2017-05-21 DIAGNOSIS — N186 End stage renal disease: Secondary | ICD-10-CM | POA: Diagnosis not present

## 2017-05-22 DIAGNOSIS — M5416 Radiculopathy, lumbar region: Secondary | ICD-10-CM | POA: Insufficient documentation

## 2017-05-22 DIAGNOSIS — C649 Malignant neoplasm of unspecified kidney, except renal pelvis: Secondary | ICD-10-CM | POA: Diagnosis not present

## 2017-05-22 DIAGNOSIS — M48061 Spinal stenosis, lumbar region without neurogenic claudication: Secondary | ICD-10-CM | POA: Diagnosis not present

## 2017-05-22 DIAGNOSIS — N186 End stage renal disease: Secondary | ICD-10-CM | POA: Insufficient documentation

## 2017-05-22 DIAGNOSIS — Z992 Dependence on renal dialysis: Secondary | ICD-10-CM | POA: Insufficient documentation

## 2017-05-22 DIAGNOSIS — E119 Type 2 diabetes mellitus without complications: Secondary | ICD-10-CM | POA: Insufficient documentation

## 2017-05-22 DIAGNOSIS — R109 Unspecified abdominal pain: Secondary | ICD-10-CM | POA: Diagnosis present

## 2017-05-22 DIAGNOSIS — Z794 Long term (current) use of insulin: Secondary | ICD-10-CM | POA: Diagnosis not present

## 2017-05-22 DIAGNOSIS — I120 Hypertensive chronic kidney disease with stage 5 chronic kidney disease or end stage renal disease: Secondary | ICD-10-CM | POA: Insufficient documentation

## 2017-05-22 DIAGNOSIS — Z79899 Other long term (current) drug therapy: Secondary | ICD-10-CM | POA: Diagnosis not present

## 2017-05-22 DIAGNOSIS — Z87891 Personal history of nicotine dependence: Secondary | ICD-10-CM | POA: Insufficient documentation

## 2017-05-22 DIAGNOSIS — R937 Abnormal findings on diagnostic imaging of other parts of musculoskeletal system: Secondary | ICD-10-CM | POA: Diagnosis not present

## 2017-05-22 DIAGNOSIS — M549 Dorsalgia, unspecified: Secondary | ICD-10-CM | POA: Diagnosis not present

## 2017-05-23 ENCOUNTER — Other Ambulatory Visit: Payer: Self-pay

## 2017-05-23 ENCOUNTER — Emergency Department: Payer: Medicare Other

## 2017-05-23 ENCOUNTER — Encounter: Payer: Self-pay | Admitting: Emergency Medicine

## 2017-05-23 ENCOUNTER — Emergency Department
Admission: EM | Admit: 2017-05-23 | Discharge: 2017-05-23 | Disposition: A | Discharge status: Home / Self Care | Payer: Medicare Other | Attending: Emergency Medicine | Admitting: Emergency Medicine

## 2017-05-23 DIAGNOSIS — M5416 Radiculopathy, lumbar region: Secondary | ICD-10-CM

## 2017-05-23 DIAGNOSIS — M549 Dorsalgia, unspecified: Secondary | ICD-10-CM | POA: Diagnosis not present

## 2017-05-23 DIAGNOSIS — M48061 Spinal stenosis, lumbar region without neurogenic claudication: Secondary | ICD-10-CM | POA: Diagnosis not present

## 2017-05-23 DIAGNOSIS — R937 Abnormal findings on diagnostic imaging of other parts of musculoskeletal system: Secondary | ICD-10-CM | POA: Diagnosis not present

## 2017-05-23 LAB — COMPREHENSIVE METABOLIC PANEL
ALK PHOS: 100 U/L (ref 38–126)
ALT: 8 U/L — AB (ref 17–63)
AST: 19 U/L (ref 15–41)
Albumin: 3.5 g/dL (ref 3.5–5.0)
Anion gap: 15 (ref 5–15)
BILIRUBIN TOTAL: 0.2 mg/dL — AB (ref 0.3–1.2)
BUN: 43 mg/dL — AB (ref 6–20)
CHLORIDE: 95 mmol/L — AB (ref 101–111)
CO2: 26 mmol/L (ref 22–32)
CREATININE: 10.13 mg/dL — AB (ref 0.61–1.24)
Calcium: 8.6 mg/dL — ABNORMAL LOW (ref 8.9–10.3)
GFR calc Af Amer: 5 mL/min — ABNORMAL LOW (ref >60)
GFR, EST NON AFRICAN AMERICAN: 5 mL/min — AB (ref >60)
Glucose, Bld: 308 mg/dL — ABNORMAL HIGH (ref 65–99)
Potassium: 4.1 mmol/L (ref 3.5–5.1)
Sodium: 136 mmol/L (ref 135–145)
TOTAL PROTEIN: 7.7 g/dL (ref 6.5–8.1)

## 2017-05-23 LAB — LIPASE, BLOOD: Lipase: 48 U/L (ref 11–51)

## 2017-05-23 LAB — CBC
HCT: 33 % — ABNORMAL LOW (ref 40.0–52.0)
Hemoglobin: 10.6 g/dL — ABNORMAL LOW (ref 13.0–18.0)
MCH: 29.7 pg (ref 26.0–34.0)
MCHC: 32.2 g/dL (ref 32.0–36.0)
MCV: 92.3 fL (ref 80.0–100.0)
PLATELETS: 130 10*3/uL — AB (ref 150–440)
RBC: 3.57 MIL/uL — ABNORMAL LOW (ref 4.40–5.90)
RDW: 16.6 % — AB (ref 11.5–14.5)
WBC: 2.8 10*3/uL — AB (ref 3.8–10.6)

## 2017-05-23 MED ORDER — LIDOCAINE 5 % EX PTCH: 1.0000 | MEDICATED_PATCH | CUTANEOUS | 0 refills | Status: DC

## 2017-05-23 MED ORDER — CYCLOBENZAPRINE HCL 10 MG PO TABS: 10.0000 mg | ORAL_TABLET | Freq: Three times a day (TID) | ORAL | 0 refills | Status: DC | PRN

## 2017-05-23 MED ORDER — LORAZEPAM 2 MG/ML IJ SOLN
1.0000 mg | Freq: Once | INTRAMUSCULAR | Status: AC
  Administered 2017-05-23: 1 mg via INTRAVENOUS
  Filled 2017-05-23: qty 1

## 2017-05-23 NOTE — ED Notes (Signed)
Pt coughed and c/o worsening back pain. Pt given warm blanket to place on his back while sitting in the wheelchair to help relieve his pain.

## 2017-05-23 NOTE — ED Provider Notes (Signed)
[facility] Emergency Department Provider Note    First MD Initiated Contact with Patient 05/23/17 0246     (approximate)  I have reviewed the triage vital signs and the nursing notes.   HISTORY  Chief Complaint Abdominal Pain    HPI Timothy Gibson is a 66 y.o. male with below list of chronic medical conditions including recent visit to the hospital for low abdominal/back pain that is worse with laying flat better with sitting forward return to the emergency department with the same complaint.  Patient states symptoms been ongoing for the past 2 weeks.  Patient states pain worsened with laying flat or laying onto his left side and as a result he has been sleeping in a recliner.  Patient denies any injury no trauma.  Patient denies any lower extremity weakness numbness.  Patient denies any radiation of the pain into the lower extremities or buttocks.  Patient denies any pain at present.  Patient denies any urinary symptoms.  Patient denies any fever.   Past Medical History:  Diagnosis Date  . Cancer ([place])    right kidney  . Diabetes mellitus without complication ([place])   . Dialysis patient [facility])    on Mon-Wed-Fri dialysis  . ESRD (end stage renal disease) ([place])   . History of gangrene   . Hypertension     Patient Active Problem List   Diagnosis Date Noted  . Fever 03/20/2017  . Chest pain 09/25/2015    Past Surgical History:  Procedure Laterality Date  . KNEE SURGERY    . left forearm AV fistula    . NEPHRECTOMY      Prior to Admission medications   Medication Sig Start Date End Date Taking? Authorizing Provider  carvedilol (COREG) 12.5 MG tablet Take 1 tablet (12.5 mg total) by mouth 2 (two) times daily with a meal. 03/22/17  Yes Gladstone Lighter, MD  docusate sodium (COLACE) 100 MG capsule Take 1 capsule (100 mg total) 2 (two) times daily by mouth. 05/16/17 05/16/18 Yes Harvest Dark, MD  HYDROcodone-acetaminophen (NORCO/VICODIN) 5-325  MG tablet Take 1 tablet every 4 (four) hours as needed by mouth. 05/16/17  Yes Paduchowski, Lennette Bihari, MD  LEVEMIR FLEXTOUCH 100 UNIT/ML Pen Inject 6-13 Units into the skin at bedtime.  07/10/15  Yes [provider]  lidocaine-prilocaine (EMLA) cream Apply 1 application topically every Monday, Wednesday, and Friday. Apply 2 hours before dialysis. 05/23/10  Yes [provider]  losartan (COZAAR) 100 MG tablet Take 100 mg by mouth daily. 01/05/17  Yes [provider]  metoCLOPramide (REGLAN) 5 MG tablet Take 1 tablet (5 mg total) by mouth 3 (three) times daily before meals. 03/22/17  Yes Gladstone Lighter, MD  metoprolol tartrate (LOPRESSOR) 25 MG tablet Take 2 tablets 2 (two) times daily by mouth. 04/05/17  Yes [provider]  sevelamer carbonate (RENVELA) 800 MG tablet Take 1,600 mg by mouth 2 (two) times daily with a meal.    Yes [provider]  vancomycin (VANCOCIN) 1-5 GM/200ML-% SOLN Inject 200 mLs (1,000 mg total) into the vein every Monday, Wednesday, and Friday with hemodialysis. X 4 weeks 03/23/17  Yes Gladstone Lighter, MD  cyclobenzaprine (FLEXERIL) 10 MG tablet Take 1 tablet (10 mg total) 3 (three) times daily as needed by mouth. 05/23/17   Gregor Hams, MD  Ferric Citrate (AURYXIA) 1 GM 210 MG(Fe) TABS Take 3 tablets by mouth 2 (two) times daily with a meal.    [provider]  lidocaine ([place])  5 % Place 1 patch daily onto the skin. Remove & Discard patch within 12 hours or as directed by MD 05/23/17   Gregor Hams, MD    Allergies No known drug allergies  Family History  Problem Relation Age of Onset  . Hypertension Father     Social History Social History   Tobacco Use  . Smoking status: Former Research scientist (life sciences)  . Smokeless tobacco: Never Used  . Tobacco comment: cigar smoker  Substance Use Topics  . Alcohol use: No  . Drug use: No    Review of Systems Constitutional: No fever/chills Eyes: No visual changes. ENT:  No sore throat. Cardiovascular: Denies chest pain. Respiratory: Denies shortness of breath. Gastrointestinal: No abdominal pain.  No nausea, no vomiting.  No diarrhea.  No constipation. Genitourinary: Negative for dysuria. Musculoskeletal: Negative for neck pain.  Positive for back pain. Integumentary: Negative for rash. Neurological: Negative for headaches, focal weakness or numbness.   ____________________________________________   PHYSICAL EXAM:  VITAL SIGNS: ED Triage Vitals  Enc Vitals Group     BP 05/23/17 0006 (!) 175/94     Pulse Rate 05/23/17 0006 95     Resp 05/23/17 0006 20     Temp 05/23/17 0006 97.9 F (36.6 C)     Temp Source 05/23/17 0006 Oral     SpO2 05/23/17 0006 100 %     Weight 05/23/17 0006 120.2 kg (265 lb)     Height 05/23/17 0006 1.93 m (6\' 4" )     Head Circumference --      Peak Flow --      Pain Score 05/23/17 0005 10     Pain Loc --      Pain Edu? --      Excl. in [place]? --     Constitutional: Alert and oriented. Well appearing and in no acute distress. Eyes: Conjunctivae are normal.  Head: Atraumatic. Mouth/Throat: Mucous membranes are moist.  Oropharynx non-erythematous. Neck: No stridor.  Cardiovascular: Normal rate, regular rhythm. Good peripheral circulation. Grossly normal heart sounds. Respiratory: Normal respiratory effort.  No retractions. Lungs CTAB. Gastrointestinal: Soft and nontender. No distention.  Musculoskeletal: No lower extremity tenderness nor edema. No gross deformities of extremities. Neurologic:  Normal speech and language. No gross focal neurologic deficits are appreciated.  Skin:  Skin is warm, dry and intact. No rash noted. Psychiatric: Mood and affect are normal. Speech and behavior are normal.  ____________________________________________   LABS (all labs ordered are listed, but only abnormal results are displayed)  Labs Reviewed  COMPREHENSIVE METABOLIC PANEL - Abnormal; Notable for the following components:       Result Value   Chloride 95 (*)    Glucose, Bld 308 (*)    BUN 43 (*)    Creatinine, Ser 10.13 (*)    Calcium 8.6 (*)    ALT 8 (*)    Total Bilirubin 0.2 (*)    GFR calc non Af Amer 5 (*)    GFR calc Af Amer 5 (*)    All other components within normal limits  CBC - Abnormal; Notable for the following components:   WBC 2.8 (*)    RBC 3.57 (*)    Hemoglobin 10.6 (*)    HCT 33.0 (*)    RDW 16.6 (*)    Platelets 130 (*)    All other components within normal limits  LIPASE, BLOOD    RADIOLOGY I, [place] N Sharbel Sahagun, personally viewed and evaluated these images (plain radiographs) as part of my  medical decision making, as well as reviewing the written report by the radiologist.  Mr Lumbar Spine Wo Contrast  Result Date: 05/23/2017 CLINICAL DATA:  Initial evaluation for acute back pain. History of end-stage renal disease. EXAM: MRI LUMBAR SPINE WITHOUT CONTRAST TECHNIQUE: Multiplanar, multisequence MR imaging of the lumbar spine was performed. No intravenous contrast was administered. COMPARISON:  None available. FINDINGS: Segmentation: Normal segmentation. Lowest well-formed disc labeled the L5-S1 level. Alignment: 3 mm anterolisthesis of L4 on L5, with 7 mm anterolisthesis of L5 on S1. Vertebral bodies otherwise normally aligned with preservation of the normal lumbar lordosis. Vertebrae: Vertebral body heights maintained. No evidence for acute or chronic fracture. Degenerative endplate Schmorl's nodes present at the superior endplate of L2 and inferior endplate of L4. Bone marrow signal intensity is diffusely heterogeneous with decreased T1 signal intensity, suspected to reflect underlying anemia related to history of end-stage renal disease. No discrete or definite worrisome osseous lesions. Conus medullaris: Extends to the L1 level and appears normal. Paraspinal and other soft tissues: Paraspinous soft tissues demonstrate no acute abnormality. Status post right nephrectomy. Disc levels: L1-2:  Mild diffuse disc bulge with disc desiccation. Moderate facet hypertrophy. Borderline spinal stenosis. No significant lateral recess narrowing. Mild bilateral L1 foraminal stenosis, slightly greater on the right. L2-3: Mild diffuse disc bulge with disc desiccation. Disc bulging slightly eccentric to the right. Moderate facet hypertrophy. Resultant mild canal stenosis. Mild bilateral L2 foraminal narrowing, slightly worse on the right. L3-4: Diffuse disc bulge with disc desiccation. Mild facet and ligamentum flavum hypertrophy. Probable superimposed shallow right foraminal disc protrusion (Series 6, image 19). This closely approximates and could potentially irritate the exiting right L3 nerve root. No significant canal stenosis. Mild bilateral L3 foraminal narrowing. L4-5: 3 mm anterolisthesis. Diffuse disc bulge with disc desiccation. Moderate to advanced facet arthrosis. Mild to moderate canal with moderate bilateral lateral recess stenosis. Moderate bilateral foraminal narrowing, right worse than left. L5-S1: 7 mm anterolisthesis. Moderate to advanced facet arthrosis. Associated broad posterior disc bulge/ pseudo disc bulge. Mild crowding of the right lateral recess without significant spinal stenosis. Moderate bilateral L5 foraminal narrowing, right worse than left. IMPRESSION: 1. No acute abnormality within the lumbar spine. 2. Grade 1 anterolisthesis of L4 on L5 and L5 on S1 with associated disc bulge and facet arthropathy, resulting in moderate bilateral L4 and L5 foraminal stenosis, worse on the right. 3. Shallow right foraminal disc protrusion at L3-4, closely approximating and potentially irritating the exiting right L3 nerve root. 4. Mild to moderate canal and subarticular stenosis at L4-5 related to disc bulge and facet disease. 5. Diffusely abnormal appearance of the visualized bone marrow, most likely related to history of end-stage renal disease. Electronically Signed   By: Jeannine Boga M.D.    On: 05/23/2017 06:01     Procedures   ____________________________________________   INITIAL IMPRESSION / ASSESSMENT AND PLAN / ED COURSE  As part of my medical decision making, I reviewed the following data within the electronic MEDICAL RECORD [id number] year old male presented with above-stated history and physical exam regarding positional low back and abdominal pain.  No pain elicited on physical exam.  CT scan was performed on 11 7 during previous hospital evaluation for the same revealed no acute abnormality.  Suspect possible lumbar radiculopathy consider the possibility of epidural abscess given recent infection and as such MRI of the lumbar spine was performed which revealed above findings of spinal stenosis and herniated disc.  Patient given Lidoderm patch as well as Flexeril for  home.     ____________________________________________  FINAL CLINICAL IMPRESSION(S) / ED DIAGNOSES  Final diagnoses:  Spinal stenosis of lumbar region, unspecified whether neurogenic claudication present  Lumbar radiculopathy     MEDICATIONS GIVEN DURING THIS VISIT:  Medications  LORazepam (ATIVAN) injection 1 mg (1 mg Intravenous Given 05/23/17 0402)     ED Discharge Orders        Ordered    lidocaine (LIDODERM) 5 %  Every 24 hours     05/23/17 0642    cyclobenzaprine (FLEXERIL) 10 MG tablet  3 times daily PRN     05/23/17 0148       Note:  This document was prepared using Dragon voice recognition software and may include unintentional dictation errors.    Gregor Hams, MD 05/23/17 2252

## 2017-05-23 NOTE — ED Triage Notes (Signed)
Pt presents to ED with lower abd pain that radiates "all the way around" to his back. Pt states his pain worsens when lying down and makes him vomit. Pain has been ongoing for the past two weeks. Seen in this ED last Wednesday for the same. CT and blood work were negative. Pain has not subsided.

## 2017-05-25 DIAGNOSIS — N186 End stage renal disease: Secondary | ICD-10-CM | POA: Diagnosis not present

## 2017-05-25 DIAGNOSIS — D509 Iron deficiency anemia, unspecified: Secondary | ICD-10-CM | POA: Diagnosis not present

## 2017-05-25 DIAGNOSIS — N2581 Secondary hyperparathyroidism of renal origin: Secondary | ICD-10-CM | POA: Diagnosis not present

## 2017-05-25 DIAGNOSIS — D631 Anemia in chronic kidney disease: Secondary | ICD-10-CM | POA: Diagnosis not present

## 2017-05-25 DIAGNOSIS — Z992 Dependence on renal dialysis: Secondary | ICD-10-CM | POA: Diagnosis not present

## 2017-05-27 DIAGNOSIS — D631 Anemia in chronic kidney disease: Secondary | ICD-10-CM | POA: Diagnosis not present

## 2017-05-27 DIAGNOSIS — N186 End stage renal disease: Secondary | ICD-10-CM | POA: Diagnosis not present

## 2017-05-27 DIAGNOSIS — Z992 Dependence on renal dialysis: Secondary | ICD-10-CM | POA: Diagnosis not present

## 2017-05-27 DIAGNOSIS — N2581 Secondary hyperparathyroidism of renal origin: Secondary | ICD-10-CM | POA: Diagnosis not present

## 2017-05-27 DIAGNOSIS — D509 Iron deficiency anemia, unspecified: Secondary | ICD-10-CM | POA: Diagnosis not present

## 2017-05-29 DIAGNOSIS — D509 Iron deficiency anemia, unspecified: Secondary | ICD-10-CM | POA: Diagnosis not present

## 2017-05-29 DIAGNOSIS — N2581 Secondary hyperparathyroidism of renal origin: Secondary | ICD-10-CM | POA: Diagnosis not present

## 2017-05-29 DIAGNOSIS — D631 Anemia in chronic kidney disease: Secondary | ICD-10-CM | POA: Diagnosis not present

## 2017-05-29 DIAGNOSIS — Z992 Dependence on renal dialysis: Secondary | ICD-10-CM | POA: Diagnosis not present

## 2017-05-29 DIAGNOSIS — N186 End stage renal disease: Secondary | ICD-10-CM | POA: Diagnosis not present

## 2017-06-01 DIAGNOSIS — D631 Anemia in chronic kidney disease: Secondary | ICD-10-CM | POA: Diagnosis not present

## 2017-06-01 DIAGNOSIS — Z992 Dependence on renal dialysis: Secondary | ICD-10-CM | POA: Diagnosis not present

## 2017-06-01 DIAGNOSIS — N186 End stage renal disease: Secondary | ICD-10-CM | POA: Diagnosis not present

## 2017-06-01 DIAGNOSIS — N2581 Secondary hyperparathyroidism of renal origin: Secondary | ICD-10-CM | POA: Diagnosis not present

## 2017-06-01 DIAGNOSIS — D509 Iron deficiency anemia, unspecified: Secondary | ICD-10-CM | POA: Diagnosis not present

## 2017-06-04 DIAGNOSIS — D631 Anemia in chronic kidney disease: Secondary | ICD-10-CM | POA: Diagnosis not present

## 2017-06-04 DIAGNOSIS — N2581 Secondary hyperparathyroidism of renal origin: Secondary | ICD-10-CM | POA: Diagnosis not present

## 2017-06-04 DIAGNOSIS — Z992 Dependence on renal dialysis: Secondary | ICD-10-CM | POA: Diagnosis not present

## 2017-06-04 DIAGNOSIS — D509 Iron deficiency anemia, unspecified: Secondary | ICD-10-CM | POA: Diagnosis not present

## 2017-06-04 DIAGNOSIS — N186 End stage renal disease: Secondary | ICD-10-CM | POA: Diagnosis not present

## 2017-06-06 DIAGNOSIS — Z992 Dependence on renal dialysis: Secondary | ICD-10-CM | POA: Diagnosis not present

## 2017-06-06 DIAGNOSIS — D509 Iron deficiency anemia, unspecified: Secondary | ICD-10-CM | POA: Diagnosis not present

## 2017-06-06 DIAGNOSIS — D631 Anemia in chronic kidney disease: Secondary | ICD-10-CM | POA: Diagnosis not present

## 2017-06-06 DIAGNOSIS — N2581 Secondary hyperparathyroidism of renal origin: Secondary | ICD-10-CM | POA: Diagnosis not present

## 2017-06-06 DIAGNOSIS — N186 End stage renal disease: Secondary | ICD-10-CM | POA: Diagnosis not present

## 2017-06-08 DIAGNOSIS — D509 Iron deficiency anemia, unspecified: Secondary | ICD-10-CM | POA: Diagnosis not present

## 2017-06-08 DIAGNOSIS — N2581 Secondary hyperparathyroidism of renal origin: Secondary | ICD-10-CM | POA: Diagnosis not present

## 2017-06-08 DIAGNOSIS — Z992 Dependence on renal dialysis: Secondary | ICD-10-CM | POA: Diagnosis not present

## 2017-06-08 DIAGNOSIS — D631 Anemia in chronic kidney disease: Secondary | ICD-10-CM | POA: Diagnosis not present

## 2017-06-08 DIAGNOSIS — N186 End stage renal disease: Secondary | ICD-10-CM | POA: Diagnosis not present

## 2017-06-11 DIAGNOSIS — Z992 Dependence on renal dialysis: Secondary | ICD-10-CM | POA: Diagnosis not present

## 2017-06-11 DIAGNOSIS — N186 End stage renal disease: Secondary | ICD-10-CM | POA: Diagnosis not present

## 2017-06-11 DIAGNOSIS — D631 Anemia in chronic kidney disease: Secondary | ICD-10-CM | POA: Diagnosis not present

## 2017-06-11 DIAGNOSIS — N2581 Secondary hyperparathyroidism of renal origin: Secondary | ICD-10-CM | POA: Diagnosis not present

## 2017-06-11 DIAGNOSIS — D509 Iron deficiency anemia, unspecified: Secondary | ICD-10-CM | POA: Diagnosis not present

## 2017-06-13 DIAGNOSIS — Z992 Dependence on renal dialysis: Secondary | ICD-10-CM | POA: Diagnosis not present

## 2017-06-13 DIAGNOSIS — N2581 Secondary hyperparathyroidism of renal origin: Secondary | ICD-10-CM | POA: Diagnosis not present

## 2017-06-13 DIAGNOSIS — D509 Iron deficiency anemia, unspecified: Secondary | ICD-10-CM | POA: Diagnosis not present

## 2017-06-13 DIAGNOSIS — D631 Anemia in chronic kidney disease: Secondary | ICD-10-CM | POA: Diagnosis not present

## 2017-06-13 DIAGNOSIS — N186 End stage renal disease: Secondary | ICD-10-CM | POA: Diagnosis not present

## 2017-06-15 DIAGNOSIS — N186 End stage renal disease: Secondary | ICD-10-CM | POA: Diagnosis not present

## 2017-06-15 DIAGNOSIS — D509 Iron deficiency anemia, unspecified: Secondary | ICD-10-CM | POA: Diagnosis not present

## 2017-06-15 DIAGNOSIS — D631 Anemia in chronic kidney disease: Secondary | ICD-10-CM | POA: Diagnosis not present

## 2017-06-15 DIAGNOSIS — Z992 Dependence on renal dialysis: Secondary | ICD-10-CM | POA: Diagnosis not present

## 2017-06-15 DIAGNOSIS — N2581 Secondary hyperparathyroidism of renal origin: Secondary | ICD-10-CM | POA: Diagnosis not present

## 2017-06-16 DIAGNOSIS — N186 End stage renal disease: Secondary | ICD-10-CM | POA: Diagnosis not present

## 2017-06-16 DIAGNOSIS — Z992 Dependence on renal dialysis: Secondary | ICD-10-CM | POA: Diagnosis not present

## 2017-06-16 DIAGNOSIS — D509 Iron deficiency anemia, unspecified: Secondary | ICD-10-CM | POA: Diagnosis not present

## 2017-06-16 DIAGNOSIS — D631 Anemia in chronic kidney disease: Secondary | ICD-10-CM | POA: Diagnosis not present

## 2017-06-16 DIAGNOSIS — N2581 Secondary hyperparathyroidism of renal origin: Secondary | ICD-10-CM | POA: Diagnosis not present

## 2017-06-18 DIAGNOSIS — N186 End stage renal disease: Secondary | ICD-10-CM | POA: Diagnosis not present

## 2017-06-18 DIAGNOSIS — D509 Iron deficiency anemia, unspecified: Secondary | ICD-10-CM | POA: Diagnosis not present

## 2017-06-18 DIAGNOSIS — D631 Anemia in chronic kidney disease: Secondary | ICD-10-CM | POA: Diagnosis not present

## 2017-06-18 DIAGNOSIS — Z992 Dependence on renal dialysis: Secondary | ICD-10-CM | POA: Diagnosis not present

## 2017-06-18 DIAGNOSIS — N2581 Secondary hyperparathyroidism of renal origin: Secondary | ICD-10-CM | POA: Diagnosis not present

## 2017-06-20 DIAGNOSIS — D509 Iron deficiency anemia, unspecified: Secondary | ICD-10-CM | POA: Diagnosis not present

## 2017-06-20 DIAGNOSIS — Z992 Dependence on renal dialysis: Secondary | ICD-10-CM | POA: Diagnosis not present

## 2017-06-20 DIAGNOSIS — N186 End stage renal disease: Secondary | ICD-10-CM | POA: Diagnosis not present

## 2017-06-20 DIAGNOSIS — N2581 Secondary hyperparathyroidism of renal origin: Secondary | ICD-10-CM | POA: Diagnosis not present

## 2017-06-20 DIAGNOSIS — D631 Anemia in chronic kidney disease: Secondary | ICD-10-CM | POA: Diagnosis not present

## 2017-06-22 DIAGNOSIS — D509 Iron deficiency anemia, unspecified: Secondary | ICD-10-CM | POA: Diagnosis not present

## 2017-06-22 DIAGNOSIS — N2581 Secondary hyperparathyroidism of renal origin: Secondary | ICD-10-CM | POA: Diagnosis not present

## 2017-06-22 DIAGNOSIS — D631 Anemia in chronic kidney disease: Secondary | ICD-10-CM | POA: Diagnosis not present

## 2017-06-22 DIAGNOSIS — N186 End stage renal disease: Secondary | ICD-10-CM | POA: Diagnosis not present

## 2017-06-22 DIAGNOSIS — Z992 Dependence on renal dialysis: Secondary | ICD-10-CM | POA: Diagnosis not present

## 2017-06-25 DIAGNOSIS — N186 End stage renal disease: Secondary | ICD-10-CM | POA: Diagnosis not present

## 2017-06-25 DIAGNOSIS — Z992 Dependence on renal dialysis: Secondary | ICD-10-CM | POA: Diagnosis not present

## 2017-06-25 DIAGNOSIS — N2581 Secondary hyperparathyroidism of renal origin: Secondary | ICD-10-CM | POA: Diagnosis not present

## 2017-06-25 DIAGNOSIS — D631 Anemia in chronic kidney disease: Secondary | ICD-10-CM | POA: Diagnosis not present

## 2017-06-25 DIAGNOSIS — D509 Iron deficiency anemia, unspecified: Secondary | ICD-10-CM | POA: Diagnosis not present

## 2017-06-27 DIAGNOSIS — D509 Iron deficiency anemia, unspecified: Secondary | ICD-10-CM | POA: Diagnosis not present

## 2017-06-27 DIAGNOSIS — Z992 Dependence on renal dialysis: Secondary | ICD-10-CM | POA: Diagnosis not present

## 2017-06-27 DIAGNOSIS — N2581 Secondary hyperparathyroidism of renal origin: Secondary | ICD-10-CM | POA: Diagnosis not present

## 2017-06-27 DIAGNOSIS — N186 End stage renal disease: Secondary | ICD-10-CM | POA: Diagnosis not present

## 2017-06-27 DIAGNOSIS — D631 Anemia in chronic kidney disease: Secondary | ICD-10-CM | POA: Diagnosis not present

## 2017-06-28 DIAGNOSIS — B351 Tinea unguium: Secondary | ICD-10-CM | POA: Diagnosis not present

## 2017-06-28 DIAGNOSIS — E1140 Type 2 diabetes mellitus with diabetic neuropathy, unspecified: Secondary | ICD-10-CM | POA: Diagnosis not present

## 2017-06-28 DIAGNOSIS — Z794 Long term (current) use of insulin: Secondary | ICD-10-CM | POA: Diagnosis not present

## 2017-06-29 DIAGNOSIS — D509 Iron deficiency anemia, unspecified: Secondary | ICD-10-CM | POA: Diagnosis not present

## 2017-06-29 DIAGNOSIS — N186 End stage renal disease: Secondary | ICD-10-CM | POA: Diagnosis not present

## 2017-06-29 DIAGNOSIS — N2581 Secondary hyperparathyroidism of renal origin: Secondary | ICD-10-CM | POA: Diagnosis not present

## 2017-06-29 DIAGNOSIS — D631 Anemia in chronic kidney disease: Secondary | ICD-10-CM | POA: Diagnosis not present

## 2017-06-29 DIAGNOSIS — Z992 Dependence on renal dialysis: Secondary | ICD-10-CM | POA: Diagnosis not present

## 2017-07-01 DIAGNOSIS — N186 End stage renal disease: Secondary | ICD-10-CM | POA: Diagnosis not present

## 2017-07-01 DIAGNOSIS — D509 Iron deficiency anemia, unspecified: Secondary | ICD-10-CM | POA: Diagnosis not present

## 2017-07-01 DIAGNOSIS — N2581 Secondary hyperparathyroidism of renal origin: Secondary | ICD-10-CM | POA: Diagnosis not present

## 2017-07-01 DIAGNOSIS — D631 Anemia in chronic kidney disease: Secondary | ICD-10-CM | POA: Diagnosis not present

## 2017-07-01 DIAGNOSIS — Z992 Dependence on renal dialysis: Secondary | ICD-10-CM | POA: Diagnosis not present

## 2017-07-04 DIAGNOSIS — Z992 Dependence on renal dialysis: Secondary | ICD-10-CM | POA: Diagnosis not present

## 2017-07-04 DIAGNOSIS — N186 End stage renal disease: Secondary | ICD-10-CM | POA: Diagnosis not present

## 2017-07-04 DIAGNOSIS — N2581 Secondary hyperparathyroidism of renal origin: Secondary | ICD-10-CM | POA: Diagnosis not present

## 2017-07-04 DIAGNOSIS — D509 Iron deficiency anemia, unspecified: Secondary | ICD-10-CM | POA: Diagnosis not present

## 2017-07-04 DIAGNOSIS — D631 Anemia in chronic kidney disease: Secondary | ICD-10-CM | POA: Diagnosis not present

## 2017-07-06 DIAGNOSIS — D631 Anemia in chronic kidney disease: Secondary | ICD-10-CM | POA: Diagnosis not present

## 2017-07-06 DIAGNOSIS — N186 End stage renal disease: Secondary | ICD-10-CM | POA: Diagnosis not present

## 2017-07-06 DIAGNOSIS — N2581 Secondary hyperparathyroidism of renal origin: Secondary | ICD-10-CM | POA: Diagnosis not present

## 2017-07-06 DIAGNOSIS — Z992 Dependence on renal dialysis: Secondary | ICD-10-CM | POA: Diagnosis not present

## 2017-07-06 DIAGNOSIS — D509 Iron deficiency anemia, unspecified: Secondary | ICD-10-CM | POA: Diagnosis not present

## 2017-07-09 DIAGNOSIS — N2581 Secondary hyperparathyroidism of renal origin: Secondary | ICD-10-CM | POA: Diagnosis not present

## 2017-07-09 DIAGNOSIS — Z992 Dependence on renal dialysis: Secondary | ICD-10-CM | POA: Diagnosis not present

## 2017-07-09 DIAGNOSIS — D631 Anemia in chronic kidney disease: Secondary | ICD-10-CM | POA: Diagnosis not present

## 2017-07-09 DIAGNOSIS — N186 End stage renal disease: Secondary | ICD-10-CM | POA: Diagnosis not present

## 2017-07-09 DIAGNOSIS — D509 Iron deficiency anemia, unspecified: Secondary | ICD-10-CM | POA: Diagnosis not present

## 2017-07-11 DIAGNOSIS — D631 Anemia in chronic kidney disease: Secondary | ICD-10-CM | POA: Diagnosis not present

## 2017-07-11 DIAGNOSIS — N186 End stage renal disease: Secondary | ICD-10-CM | POA: Diagnosis not present

## 2017-07-11 DIAGNOSIS — Z23 Encounter for immunization: Secondary | ICD-10-CM | POA: Diagnosis not present

## 2017-07-11 DIAGNOSIS — D509 Iron deficiency anemia, unspecified: Secondary | ICD-10-CM | POA: Diagnosis not present

## 2017-07-11 DIAGNOSIS — E8779 Other fluid overload: Secondary | ICD-10-CM | POA: Diagnosis not present

## 2017-07-11 DIAGNOSIS — Z992 Dependence on renal dialysis: Secondary | ICD-10-CM | POA: Diagnosis not present

## 2017-07-11 DIAGNOSIS — N2581 Secondary hyperparathyroidism of renal origin: Secondary | ICD-10-CM | POA: Diagnosis not present

## 2017-07-11 DIAGNOSIS — I120 Hypertensive chronic kidney disease with stage 5 chronic kidney disease or end stage renal disease: Secondary | ICD-10-CM | POA: Diagnosis not present

## 2017-07-13 DIAGNOSIS — Z23 Encounter for immunization: Secondary | ICD-10-CM | POA: Diagnosis not present

## 2017-07-13 DIAGNOSIS — D509 Iron deficiency anemia, unspecified: Secondary | ICD-10-CM | POA: Diagnosis not present

## 2017-07-13 DIAGNOSIS — N186 End stage renal disease: Secondary | ICD-10-CM | POA: Diagnosis not present

## 2017-07-13 DIAGNOSIS — Z992 Dependence on renal dialysis: Secondary | ICD-10-CM | POA: Diagnosis not present

## 2017-07-13 DIAGNOSIS — N2581 Secondary hyperparathyroidism of renal origin: Secondary | ICD-10-CM | POA: Diagnosis not present

## 2017-07-13 DIAGNOSIS — D631 Anemia in chronic kidney disease: Secondary | ICD-10-CM | POA: Diagnosis not present

## 2017-07-16 DIAGNOSIS — D631 Anemia in chronic kidney disease: Secondary | ICD-10-CM | POA: Diagnosis not present

## 2017-07-16 DIAGNOSIS — Z992 Dependence on renal dialysis: Secondary | ICD-10-CM | POA: Diagnosis not present

## 2017-07-16 DIAGNOSIS — Z23 Encounter for immunization: Secondary | ICD-10-CM | POA: Diagnosis not present

## 2017-07-16 DIAGNOSIS — D509 Iron deficiency anemia, unspecified: Secondary | ICD-10-CM | POA: Diagnosis not present

## 2017-07-16 DIAGNOSIS — N186 End stage renal disease: Secondary | ICD-10-CM | POA: Diagnosis not present

## 2017-07-16 DIAGNOSIS — N2581 Secondary hyperparathyroidism of renal origin: Secondary | ICD-10-CM | POA: Diagnosis not present

## 2017-07-18 DIAGNOSIS — D509 Iron deficiency anemia, unspecified: Secondary | ICD-10-CM | POA: Diagnosis not present

## 2017-07-18 DIAGNOSIS — D631 Anemia in chronic kidney disease: Secondary | ICD-10-CM | POA: Diagnosis not present

## 2017-07-18 DIAGNOSIS — Z992 Dependence on renal dialysis: Secondary | ICD-10-CM | POA: Diagnosis not present

## 2017-07-18 DIAGNOSIS — Z23 Encounter for immunization: Secondary | ICD-10-CM | POA: Diagnosis not present

## 2017-07-18 DIAGNOSIS — N2581 Secondary hyperparathyroidism of renal origin: Secondary | ICD-10-CM | POA: Diagnosis not present

## 2017-07-18 DIAGNOSIS — N186 End stage renal disease: Secondary | ICD-10-CM | POA: Diagnosis not present

## 2017-07-20 DIAGNOSIS — Z23 Encounter for immunization: Secondary | ICD-10-CM | POA: Diagnosis not present

## 2017-07-20 DIAGNOSIS — D509 Iron deficiency anemia, unspecified: Secondary | ICD-10-CM | POA: Diagnosis not present

## 2017-07-20 DIAGNOSIS — N186 End stage renal disease: Secondary | ICD-10-CM | POA: Diagnosis not present

## 2017-07-20 DIAGNOSIS — Z992 Dependence on renal dialysis: Secondary | ICD-10-CM | POA: Diagnosis not present

## 2017-07-20 DIAGNOSIS — N2581 Secondary hyperparathyroidism of renal origin: Secondary | ICD-10-CM | POA: Diagnosis not present

## 2017-07-20 DIAGNOSIS — D631 Anemia in chronic kidney disease: Secondary | ICD-10-CM | POA: Diagnosis not present

## 2017-07-23 DIAGNOSIS — N2581 Secondary hyperparathyroidism of renal origin: Secondary | ICD-10-CM | POA: Diagnosis not present

## 2017-07-23 DIAGNOSIS — Z992 Dependence on renal dialysis: Secondary | ICD-10-CM | POA: Diagnosis not present

## 2017-07-23 DIAGNOSIS — E119 Type 2 diabetes mellitus without complications: Secondary | ICD-10-CM | POA: Diagnosis not present

## 2017-07-23 DIAGNOSIS — Z23 Encounter for immunization: Secondary | ICD-10-CM | POA: Diagnosis not present

## 2017-07-23 DIAGNOSIS — D509 Iron deficiency anemia, unspecified: Secondary | ICD-10-CM | POA: Diagnosis not present

## 2017-07-23 DIAGNOSIS — D631 Anemia in chronic kidney disease: Secondary | ICD-10-CM | POA: Diagnosis not present

## 2017-07-23 DIAGNOSIS — N186 End stage renal disease: Secondary | ICD-10-CM | POA: Diagnosis not present

## 2017-07-24 DIAGNOSIS — Z23 Encounter for immunization: Secondary | ICD-10-CM | POA: Diagnosis not present

## 2017-07-24 DIAGNOSIS — N2581 Secondary hyperparathyroidism of renal origin: Secondary | ICD-10-CM | POA: Diagnosis not present

## 2017-07-24 DIAGNOSIS — D509 Iron deficiency anemia, unspecified: Secondary | ICD-10-CM | POA: Diagnosis not present

## 2017-07-24 DIAGNOSIS — N186 End stage renal disease: Secondary | ICD-10-CM | POA: Diagnosis not present

## 2017-07-24 DIAGNOSIS — Z992 Dependence on renal dialysis: Secondary | ICD-10-CM | POA: Diagnosis not present

## 2017-07-24 DIAGNOSIS — D631 Anemia in chronic kidney disease: Secondary | ICD-10-CM | POA: Diagnosis not present

## 2017-07-25 DIAGNOSIS — D631 Anemia in chronic kidney disease: Secondary | ICD-10-CM | POA: Diagnosis not present

## 2017-07-25 DIAGNOSIS — Z23 Encounter for immunization: Secondary | ICD-10-CM | POA: Diagnosis not present

## 2017-07-25 DIAGNOSIS — D509 Iron deficiency anemia, unspecified: Secondary | ICD-10-CM | POA: Diagnosis not present

## 2017-07-25 DIAGNOSIS — N186 End stage renal disease: Secondary | ICD-10-CM | POA: Diagnosis not present

## 2017-07-25 DIAGNOSIS — N2581 Secondary hyperparathyroidism of renal origin: Secondary | ICD-10-CM | POA: Diagnosis not present

## 2017-07-25 DIAGNOSIS — Z992 Dependence on renal dialysis: Secondary | ICD-10-CM | POA: Diagnosis not present

## 2017-07-27 DIAGNOSIS — Z992 Dependence on renal dialysis: Secondary | ICD-10-CM | POA: Diagnosis not present

## 2017-07-27 DIAGNOSIS — N186 End stage renal disease: Secondary | ICD-10-CM | POA: Diagnosis not present

## 2017-07-27 DIAGNOSIS — D631 Anemia in chronic kidney disease: Secondary | ICD-10-CM | POA: Diagnosis not present

## 2017-07-27 DIAGNOSIS — N2581 Secondary hyperparathyroidism of renal origin: Secondary | ICD-10-CM | POA: Diagnosis not present

## 2017-07-27 DIAGNOSIS — D509 Iron deficiency anemia, unspecified: Secondary | ICD-10-CM | POA: Diagnosis not present

## 2017-07-27 DIAGNOSIS — Z23 Encounter for immunization: Secondary | ICD-10-CM | POA: Diagnosis not present

## 2017-07-30 DIAGNOSIS — Z23 Encounter for immunization: Secondary | ICD-10-CM | POA: Diagnosis not present

## 2017-07-30 DIAGNOSIS — D509 Iron deficiency anemia, unspecified: Secondary | ICD-10-CM | POA: Diagnosis not present

## 2017-07-30 DIAGNOSIS — N2581 Secondary hyperparathyroidism of renal origin: Secondary | ICD-10-CM | POA: Diagnosis not present

## 2017-07-30 DIAGNOSIS — N186 End stage renal disease: Secondary | ICD-10-CM | POA: Diagnosis not present

## 2017-07-30 DIAGNOSIS — Z992 Dependence on renal dialysis: Secondary | ICD-10-CM | POA: Diagnosis not present

## 2017-07-30 DIAGNOSIS — D631 Anemia in chronic kidney disease: Secondary | ICD-10-CM | POA: Diagnosis not present

## 2017-08-01 DIAGNOSIS — N186 End stage renal disease: Secondary | ICD-10-CM | POA: Diagnosis not present

## 2017-08-01 DIAGNOSIS — D509 Iron deficiency anemia, unspecified: Secondary | ICD-10-CM | POA: Diagnosis not present

## 2017-08-01 DIAGNOSIS — Z992 Dependence on renal dialysis: Secondary | ICD-10-CM | POA: Diagnosis not present

## 2017-08-01 DIAGNOSIS — D631 Anemia in chronic kidney disease: Secondary | ICD-10-CM | POA: Diagnosis not present

## 2017-08-01 DIAGNOSIS — Z23 Encounter for immunization: Secondary | ICD-10-CM | POA: Diagnosis not present

## 2017-08-01 DIAGNOSIS — N2581 Secondary hyperparathyroidism of renal origin: Secondary | ICD-10-CM | POA: Diagnosis not present

## 2017-08-02 DIAGNOSIS — R05 Cough: Secondary | ICD-10-CM | POA: Diagnosis not present

## 2017-08-03 DIAGNOSIS — Z23 Encounter for immunization: Secondary | ICD-10-CM | POA: Diagnosis not present

## 2017-08-03 DIAGNOSIS — N2581 Secondary hyperparathyroidism of renal origin: Secondary | ICD-10-CM | POA: Diagnosis not present

## 2017-08-03 DIAGNOSIS — D509 Iron deficiency anemia, unspecified: Secondary | ICD-10-CM | POA: Diagnosis not present

## 2017-08-03 DIAGNOSIS — D631 Anemia in chronic kidney disease: Secondary | ICD-10-CM | POA: Diagnosis not present

## 2017-08-03 DIAGNOSIS — N186 End stage renal disease: Secondary | ICD-10-CM | POA: Diagnosis not present

## 2017-08-03 DIAGNOSIS — Z992 Dependence on renal dialysis: Secondary | ICD-10-CM | POA: Diagnosis not present

## 2017-08-06 DIAGNOSIS — N2581 Secondary hyperparathyroidism of renal origin: Secondary | ICD-10-CM | POA: Diagnosis not present

## 2017-08-06 DIAGNOSIS — Z23 Encounter for immunization: Secondary | ICD-10-CM | POA: Diagnosis not present

## 2017-08-06 DIAGNOSIS — N186 End stage renal disease: Secondary | ICD-10-CM | POA: Diagnosis not present

## 2017-08-06 DIAGNOSIS — Z992 Dependence on renal dialysis: Secondary | ICD-10-CM | POA: Diagnosis not present

## 2017-08-06 DIAGNOSIS — D631 Anemia in chronic kidney disease: Secondary | ICD-10-CM | POA: Diagnosis not present

## 2017-08-06 DIAGNOSIS — D509 Iron deficiency anemia, unspecified: Secondary | ICD-10-CM | POA: Diagnosis not present

## 2017-08-08 DIAGNOSIS — D509 Iron deficiency anemia, unspecified: Secondary | ICD-10-CM | POA: Diagnosis not present

## 2017-08-08 DIAGNOSIS — N2581 Secondary hyperparathyroidism of renal origin: Secondary | ICD-10-CM | POA: Diagnosis not present

## 2017-08-08 DIAGNOSIS — D631 Anemia in chronic kidney disease: Secondary | ICD-10-CM | POA: Diagnosis not present

## 2017-08-08 DIAGNOSIS — Z23 Encounter for immunization: Secondary | ICD-10-CM | POA: Diagnosis not present

## 2017-08-08 DIAGNOSIS — N186 End stage renal disease: Secondary | ICD-10-CM | POA: Diagnosis not present

## 2017-08-08 DIAGNOSIS — Z992 Dependence on renal dialysis: Secondary | ICD-10-CM | POA: Diagnosis not present

## 2017-08-09 DIAGNOSIS — Z992 Dependence on renal dialysis: Secondary | ICD-10-CM | POA: Diagnosis not present

## 2017-08-09 DIAGNOSIS — N186 End stage renal disease: Secondary | ICD-10-CM | POA: Diagnosis not present

## 2017-08-10 DIAGNOSIS — Z992 Dependence on renal dialysis: Secondary | ICD-10-CM | POA: Diagnosis not present

## 2017-08-10 DIAGNOSIS — N2581 Secondary hyperparathyroidism of renal origin: Secondary | ICD-10-CM | POA: Diagnosis not present

## 2017-08-10 DIAGNOSIS — D631 Anemia in chronic kidney disease: Secondary | ICD-10-CM | POA: Diagnosis not present

## 2017-08-10 DIAGNOSIS — N186 End stage renal disease: Secondary | ICD-10-CM | POA: Diagnosis not present

## 2017-08-10 DIAGNOSIS — D509 Iron deficiency anemia, unspecified: Secondary | ICD-10-CM | POA: Diagnosis not present

## 2017-08-13 DIAGNOSIS — Z992 Dependence on renal dialysis: Secondary | ICD-10-CM | POA: Diagnosis not present

## 2017-08-13 DIAGNOSIS — D509 Iron deficiency anemia, unspecified: Secondary | ICD-10-CM | POA: Diagnosis not present

## 2017-08-13 DIAGNOSIS — N186 End stage renal disease: Secondary | ICD-10-CM | POA: Diagnosis not present

## 2017-08-13 DIAGNOSIS — N2581 Secondary hyperparathyroidism of renal origin: Secondary | ICD-10-CM | POA: Diagnosis not present

## 2017-08-13 DIAGNOSIS — D631 Anemia in chronic kidney disease: Secondary | ICD-10-CM | POA: Diagnosis not present

## 2017-08-15 DIAGNOSIS — D509 Iron deficiency anemia, unspecified: Secondary | ICD-10-CM | POA: Diagnosis not present

## 2017-08-15 DIAGNOSIS — Z992 Dependence on renal dialysis: Secondary | ICD-10-CM | POA: Diagnosis not present

## 2017-08-15 DIAGNOSIS — N186 End stage renal disease: Secondary | ICD-10-CM | POA: Diagnosis not present

## 2017-08-15 DIAGNOSIS — D631 Anemia in chronic kidney disease: Secondary | ICD-10-CM | POA: Diagnosis not present

## 2017-08-15 DIAGNOSIS — N2581 Secondary hyperparathyroidism of renal origin: Secondary | ICD-10-CM | POA: Diagnosis not present

## 2017-08-17 DIAGNOSIS — N2581 Secondary hyperparathyroidism of renal origin: Secondary | ICD-10-CM | POA: Diagnosis not present

## 2017-08-17 DIAGNOSIS — D509 Iron deficiency anemia, unspecified: Secondary | ICD-10-CM | POA: Diagnosis not present

## 2017-08-17 DIAGNOSIS — D631 Anemia in chronic kidney disease: Secondary | ICD-10-CM | POA: Diagnosis not present

## 2017-08-17 DIAGNOSIS — N186 End stage renal disease: Secondary | ICD-10-CM | POA: Diagnosis not present

## 2017-08-17 DIAGNOSIS — Z992 Dependence on renal dialysis: Secondary | ICD-10-CM | POA: Diagnosis not present

## 2017-08-20 DIAGNOSIS — Z992 Dependence on renal dialysis: Secondary | ICD-10-CM | POA: Diagnosis not present

## 2017-08-20 DIAGNOSIS — D509 Iron deficiency anemia, unspecified: Secondary | ICD-10-CM | POA: Diagnosis not present

## 2017-08-20 DIAGNOSIS — N186 End stage renal disease: Secondary | ICD-10-CM | POA: Diagnosis not present

## 2017-08-20 DIAGNOSIS — D631 Anemia in chronic kidney disease: Secondary | ICD-10-CM | POA: Diagnosis not present

## 2017-08-20 DIAGNOSIS — N2581 Secondary hyperparathyroidism of renal origin: Secondary | ICD-10-CM | POA: Diagnosis not present

## 2017-08-22 DIAGNOSIS — D509 Iron deficiency anemia, unspecified: Secondary | ICD-10-CM | POA: Diagnosis not present

## 2017-08-22 DIAGNOSIS — N186 End stage renal disease: Secondary | ICD-10-CM | POA: Diagnosis not present

## 2017-08-22 DIAGNOSIS — D631 Anemia in chronic kidney disease: Secondary | ICD-10-CM | POA: Diagnosis not present

## 2017-08-22 DIAGNOSIS — Z992 Dependence on renal dialysis: Secondary | ICD-10-CM | POA: Diagnosis not present

## 2017-08-22 DIAGNOSIS — N2581 Secondary hyperparathyroidism of renal origin: Secondary | ICD-10-CM | POA: Diagnosis not present

## 2017-08-24 DIAGNOSIS — Z992 Dependence on renal dialysis: Secondary | ICD-10-CM | POA: Diagnosis not present

## 2017-08-24 DIAGNOSIS — D509 Iron deficiency anemia, unspecified: Secondary | ICD-10-CM | POA: Diagnosis not present

## 2017-08-24 DIAGNOSIS — D631 Anemia in chronic kidney disease: Secondary | ICD-10-CM | POA: Diagnosis not present

## 2017-08-24 DIAGNOSIS — N2581 Secondary hyperparathyroidism of renal origin: Secondary | ICD-10-CM | POA: Diagnosis not present

## 2017-08-24 DIAGNOSIS — N186 End stage renal disease: Secondary | ICD-10-CM | POA: Diagnosis not present

## 2017-08-27 DIAGNOSIS — N186 End stage renal disease: Secondary | ICD-10-CM | POA: Diagnosis not present

## 2017-08-27 DIAGNOSIS — Z992 Dependence on renal dialysis: Secondary | ICD-10-CM | POA: Diagnosis not present

## 2017-08-27 DIAGNOSIS — D509 Iron deficiency anemia, unspecified: Secondary | ICD-10-CM | POA: Diagnosis not present

## 2017-08-27 DIAGNOSIS — N2581 Secondary hyperparathyroidism of renal origin: Secondary | ICD-10-CM | POA: Diagnosis not present

## 2017-08-27 DIAGNOSIS — D631 Anemia in chronic kidney disease: Secondary | ICD-10-CM | POA: Diagnosis not present

## 2017-08-29 DIAGNOSIS — D509 Iron deficiency anemia, unspecified: Secondary | ICD-10-CM | POA: Diagnosis not present

## 2017-08-29 DIAGNOSIS — N2581 Secondary hyperparathyroidism of renal origin: Secondary | ICD-10-CM | POA: Diagnosis not present

## 2017-08-29 DIAGNOSIS — N186 End stage renal disease: Secondary | ICD-10-CM | POA: Diagnosis not present

## 2017-08-29 DIAGNOSIS — D631 Anemia in chronic kidney disease: Secondary | ICD-10-CM | POA: Diagnosis not present

## 2017-08-29 DIAGNOSIS — Z992 Dependence on renal dialysis: Secondary | ICD-10-CM | POA: Diagnosis not present

## 2017-08-31 DIAGNOSIS — D509 Iron deficiency anemia, unspecified: Secondary | ICD-10-CM | POA: Diagnosis not present

## 2017-08-31 DIAGNOSIS — D631 Anemia in chronic kidney disease: Secondary | ICD-10-CM | POA: Diagnosis not present

## 2017-08-31 DIAGNOSIS — N186 End stage renal disease: Secondary | ICD-10-CM | POA: Diagnosis not present

## 2017-08-31 DIAGNOSIS — Z992 Dependence on renal dialysis: Secondary | ICD-10-CM | POA: Diagnosis not present

## 2017-08-31 DIAGNOSIS — N2581 Secondary hyperparathyroidism of renal origin: Secondary | ICD-10-CM | POA: Diagnosis not present

## 2017-09-03 DIAGNOSIS — N186 End stage renal disease: Secondary | ICD-10-CM | POA: Diagnosis not present

## 2017-09-03 DIAGNOSIS — D509 Iron deficiency anemia, unspecified: Secondary | ICD-10-CM | POA: Diagnosis not present

## 2017-09-03 DIAGNOSIS — N2581 Secondary hyperparathyroidism of renal origin: Secondary | ICD-10-CM | POA: Diagnosis not present

## 2017-09-03 DIAGNOSIS — D631 Anemia in chronic kidney disease: Secondary | ICD-10-CM | POA: Diagnosis not present

## 2017-09-03 DIAGNOSIS — Z992 Dependence on renal dialysis: Secondary | ICD-10-CM | POA: Diagnosis not present

## 2017-09-05 DIAGNOSIS — D509 Iron deficiency anemia, unspecified: Secondary | ICD-10-CM | POA: Diagnosis not present

## 2017-09-05 DIAGNOSIS — N2581 Secondary hyperparathyroidism of renal origin: Secondary | ICD-10-CM | POA: Diagnosis not present

## 2017-09-05 DIAGNOSIS — Z992 Dependence on renal dialysis: Secondary | ICD-10-CM | POA: Diagnosis not present

## 2017-09-05 DIAGNOSIS — D631 Anemia in chronic kidney disease: Secondary | ICD-10-CM | POA: Diagnosis not present

## 2017-09-05 DIAGNOSIS — N186 End stage renal disease: Secondary | ICD-10-CM | POA: Diagnosis not present

## 2017-09-06 DIAGNOSIS — Z992 Dependence on renal dialysis: Secondary | ICD-10-CM | POA: Diagnosis not present

## 2017-09-06 DIAGNOSIS — N186 End stage renal disease: Secondary | ICD-10-CM | POA: Diagnosis not present

## 2017-09-07 DIAGNOSIS — D631 Anemia in chronic kidney disease: Secondary | ICD-10-CM | POA: Diagnosis not present

## 2017-09-07 DIAGNOSIS — D509 Iron deficiency anemia, unspecified: Secondary | ICD-10-CM | POA: Diagnosis not present

## 2017-09-07 DIAGNOSIS — Z992 Dependence on renal dialysis: Secondary | ICD-10-CM | POA: Diagnosis not present

## 2017-09-07 DIAGNOSIS — N2581 Secondary hyperparathyroidism of renal origin: Secondary | ICD-10-CM | POA: Diagnosis not present

## 2017-09-07 DIAGNOSIS — N186 End stage renal disease: Secondary | ICD-10-CM | POA: Diagnosis not present

## 2017-09-10 DIAGNOSIS — N186 End stage renal disease: Secondary | ICD-10-CM | POA: Diagnosis not present

## 2017-09-10 DIAGNOSIS — N2581 Secondary hyperparathyroidism of renal origin: Secondary | ICD-10-CM | POA: Diagnosis not present

## 2017-09-10 DIAGNOSIS — D509 Iron deficiency anemia, unspecified: Secondary | ICD-10-CM | POA: Diagnosis not present

## 2017-09-10 DIAGNOSIS — Z992 Dependence on renal dialysis: Secondary | ICD-10-CM | POA: Diagnosis not present

## 2017-09-10 DIAGNOSIS — D631 Anemia in chronic kidney disease: Secondary | ICD-10-CM | POA: Diagnosis not present

## 2017-09-12 DIAGNOSIS — N2581 Secondary hyperparathyroidism of renal origin: Secondary | ICD-10-CM | POA: Diagnosis not present

## 2017-09-12 DIAGNOSIS — N186 End stage renal disease: Secondary | ICD-10-CM | POA: Diagnosis not present

## 2017-09-12 DIAGNOSIS — Z992 Dependence on renal dialysis: Secondary | ICD-10-CM | POA: Diagnosis not present

## 2017-09-12 DIAGNOSIS — D509 Iron deficiency anemia, unspecified: Secondary | ICD-10-CM | POA: Diagnosis not present

## 2017-09-12 DIAGNOSIS — D631 Anemia in chronic kidney disease: Secondary | ICD-10-CM | POA: Diagnosis not present

## 2017-09-14 DIAGNOSIS — Z992 Dependence on renal dialysis: Secondary | ICD-10-CM | POA: Diagnosis not present

## 2017-09-14 DIAGNOSIS — D631 Anemia in chronic kidney disease: Secondary | ICD-10-CM | POA: Diagnosis not present

## 2017-09-14 DIAGNOSIS — D509 Iron deficiency anemia, unspecified: Secondary | ICD-10-CM | POA: Diagnosis not present

## 2017-09-14 DIAGNOSIS — N2581 Secondary hyperparathyroidism of renal origin: Secondary | ICD-10-CM | POA: Diagnosis not present

## 2017-09-14 DIAGNOSIS — N186 End stage renal disease: Secondary | ICD-10-CM | POA: Diagnosis not present

## 2017-09-17 DIAGNOSIS — N186 End stage renal disease: Secondary | ICD-10-CM | POA: Diagnosis not present

## 2017-09-17 DIAGNOSIS — D509 Iron deficiency anemia, unspecified: Secondary | ICD-10-CM | POA: Diagnosis not present

## 2017-09-17 DIAGNOSIS — D631 Anemia in chronic kidney disease: Secondary | ICD-10-CM | POA: Diagnosis not present

## 2017-09-17 DIAGNOSIS — N2581 Secondary hyperparathyroidism of renal origin: Secondary | ICD-10-CM | POA: Diagnosis not present

## 2017-09-17 DIAGNOSIS — Z992 Dependence on renal dialysis: Secondary | ICD-10-CM | POA: Diagnosis not present

## 2017-09-19 DIAGNOSIS — D631 Anemia in chronic kidney disease: Secondary | ICD-10-CM | POA: Diagnosis not present

## 2017-09-19 DIAGNOSIS — D509 Iron deficiency anemia, unspecified: Secondary | ICD-10-CM | POA: Diagnosis not present

## 2017-09-19 DIAGNOSIS — Z992 Dependence on renal dialysis: Secondary | ICD-10-CM | POA: Diagnosis not present

## 2017-09-19 DIAGNOSIS — N186 End stage renal disease: Secondary | ICD-10-CM | POA: Diagnosis not present

## 2017-09-19 DIAGNOSIS — N2581 Secondary hyperparathyroidism of renal origin: Secondary | ICD-10-CM | POA: Diagnosis not present

## 2017-09-21 DIAGNOSIS — Z992 Dependence on renal dialysis: Secondary | ICD-10-CM | POA: Diagnosis not present

## 2017-09-21 DIAGNOSIS — N2581 Secondary hyperparathyroidism of renal origin: Secondary | ICD-10-CM | POA: Diagnosis not present

## 2017-09-21 DIAGNOSIS — D509 Iron deficiency anemia, unspecified: Secondary | ICD-10-CM | POA: Diagnosis not present

## 2017-09-21 DIAGNOSIS — N186 End stage renal disease: Secondary | ICD-10-CM | POA: Diagnosis not present

## 2017-09-21 DIAGNOSIS — D631 Anemia in chronic kidney disease: Secondary | ICD-10-CM | POA: Diagnosis not present

## 2017-09-24 DIAGNOSIS — N2581 Secondary hyperparathyroidism of renal origin: Secondary | ICD-10-CM | POA: Diagnosis not present

## 2017-09-24 DIAGNOSIS — D631 Anemia in chronic kidney disease: Secondary | ICD-10-CM | POA: Diagnosis not present

## 2017-09-24 DIAGNOSIS — N186 End stage renal disease: Secondary | ICD-10-CM | POA: Diagnosis not present

## 2017-09-24 DIAGNOSIS — Z992 Dependence on renal dialysis: Secondary | ICD-10-CM | POA: Diagnosis not present

## 2017-09-24 DIAGNOSIS — D509 Iron deficiency anemia, unspecified: Secondary | ICD-10-CM | POA: Diagnosis not present

## 2017-09-26 DIAGNOSIS — N186 End stage renal disease: Secondary | ICD-10-CM | POA: Diagnosis not present

## 2017-09-26 DIAGNOSIS — D509 Iron deficiency anemia, unspecified: Secondary | ICD-10-CM | POA: Diagnosis not present

## 2017-09-26 DIAGNOSIS — D631 Anemia in chronic kidney disease: Secondary | ICD-10-CM | POA: Diagnosis not present

## 2017-09-26 DIAGNOSIS — Z992 Dependence on renal dialysis: Secondary | ICD-10-CM | POA: Diagnosis not present

## 2017-09-26 DIAGNOSIS — N2581 Secondary hyperparathyroidism of renal origin: Secondary | ICD-10-CM | POA: Diagnosis not present

## 2017-09-27 DIAGNOSIS — E1140 Type 2 diabetes mellitus with diabetic neuropathy, unspecified: Secondary | ICD-10-CM | POA: Diagnosis not present

## 2017-09-27 DIAGNOSIS — Z794 Long term (current) use of insulin: Secondary | ICD-10-CM | POA: Diagnosis not present

## 2017-09-27 DIAGNOSIS — B351 Tinea unguium: Secondary | ICD-10-CM | POA: Diagnosis not present

## 2017-09-28 DIAGNOSIS — N186 End stage renal disease: Secondary | ICD-10-CM | POA: Diagnosis not present

## 2017-09-28 DIAGNOSIS — D631 Anemia in chronic kidney disease: Secondary | ICD-10-CM | POA: Diagnosis not present

## 2017-09-28 DIAGNOSIS — Z992 Dependence on renal dialysis: Secondary | ICD-10-CM | POA: Diagnosis not present

## 2017-09-28 DIAGNOSIS — N2581 Secondary hyperparathyroidism of renal origin: Secondary | ICD-10-CM | POA: Diagnosis not present

## 2017-09-28 DIAGNOSIS — D509 Iron deficiency anemia, unspecified: Secondary | ICD-10-CM | POA: Diagnosis not present

## 2017-10-01 DIAGNOSIS — N186 End stage renal disease: Secondary | ICD-10-CM | POA: Diagnosis not present

## 2017-10-01 DIAGNOSIS — D631 Anemia in chronic kidney disease: Secondary | ICD-10-CM | POA: Diagnosis not present

## 2017-10-01 DIAGNOSIS — D509 Iron deficiency anemia, unspecified: Secondary | ICD-10-CM | POA: Diagnosis not present

## 2017-10-01 DIAGNOSIS — Z992 Dependence on renal dialysis: Secondary | ICD-10-CM | POA: Diagnosis not present

## 2017-10-01 DIAGNOSIS — N2581 Secondary hyperparathyroidism of renal origin: Secondary | ICD-10-CM | POA: Diagnosis not present

## 2017-10-03 DIAGNOSIS — N2581 Secondary hyperparathyroidism of renal origin: Secondary | ICD-10-CM | POA: Diagnosis not present

## 2017-10-03 DIAGNOSIS — D509 Iron deficiency anemia, unspecified: Secondary | ICD-10-CM | POA: Diagnosis not present

## 2017-10-03 DIAGNOSIS — N186 End stage renal disease: Secondary | ICD-10-CM | POA: Diagnosis not present

## 2017-10-03 DIAGNOSIS — Z992 Dependence on renal dialysis: Secondary | ICD-10-CM | POA: Diagnosis not present

## 2017-10-03 DIAGNOSIS — D631 Anemia in chronic kidney disease: Secondary | ICD-10-CM | POA: Diagnosis not present

## 2017-10-05 DIAGNOSIS — D509 Iron deficiency anemia, unspecified: Secondary | ICD-10-CM | POA: Diagnosis not present

## 2017-10-05 DIAGNOSIS — D631 Anemia in chronic kidney disease: Secondary | ICD-10-CM | POA: Diagnosis not present

## 2017-10-05 DIAGNOSIS — N186 End stage renal disease: Secondary | ICD-10-CM | POA: Diagnosis not present

## 2017-10-05 DIAGNOSIS — Z992 Dependence on renal dialysis: Secondary | ICD-10-CM | POA: Diagnosis not present

## 2017-10-05 DIAGNOSIS — N2581 Secondary hyperparathyroidism of renal origin: Secondary | ICD-10-CM | POA: Diagnosis not present

## 2017-10-07 DIAGNOSIS — N186 End stage renal disease: Secondary | ICD-10-CM | POA: Diagnosis not present

## 2017-10-07 DIAGNOSIS — Z992 Dependence on renal dialysis: Secondary | ICD-10-CM | POA: Diagnosis not present

## 2017-10-08 DIAGNOSIS — D631 Anemia in chronic kidney disease: Secondary | ICD-10-CM | POA: Diagnosis not present

## 2017-10-08 DIAGNOSIS — D509 Iron deficiency anemia, unspecified: Secondary | ICD-10-CM | POA: Diagnosis not present

## 2017-10-08 DIAGNOSIS — N186 End stage renal disease: Secondary | ICD-10-CM | POA: Diagnosis not present

## 2017-10-08 DIAGNOSIS — Z992 Dependence on renal dialysis: Secondary | ICD-10-CM | POA: Diagnosis not present

## 2017-10-08 DIAGNOSIS — N2581 Secondary hyperparathyroidism of renal origin: Secondary | ICD-10-CM | POA: Diagnosis not present

## 2017-10-10 DIAGNOSIS — N2581 Secondary hyperparathyroidism of renal origin: Secondary | ICD-10-CM | POA: Diagnosis not present

## 2017-10-10 DIAGNOSIS — D509 Iron deficiency anemia, unspecified: Secondary | ICD-10-CM | POA: Diagnosis not present

## 2017-10-10 DIAGNOSIS — Z992 Dependence on renal dialysis: Secondary | ICD-10-CM | POA: Diagnosis not present

## 2017-10-10 DIAGNOSIS — D631 Anemia in chronic kidney disease: Secondary | ICD-10-CM | POA: Diagnosis not present

## 2017-10-10 DIAGNOSIS — N186 End stage renal disease: Secondary | ICD-10-CM | POA: Diagnosis not present

## 2017-10-12 DIAGNOSIS — N2581 Secondary hyperparathyroidism of renal origin: Secondary | ICD-10-CM | POA: Diagnosis not present

## 2017-10-12 DIAGNOSIS — D631 Anemia in chronic kidney disease: Secondary | ICD-10-CM | POA: Diagnosis not present

## 2017-10-12 DIAGNOSIS — Z992 Dependence on renal dialysis: Secondary | ICD-10-CM | POA: Diagnosis not present

## 2017-10-12 DIAGNOSIS — D509 Iron deficiency anemia, unspecified: Secondary | ICD-10-CM | POA: Diagnosis not present

## 2017-10-12 DIAGNOSIS — N186 End stage renal disease: Secondary | ICD-10-CM | POA: Diagnosis not present

## 2017-10-15 DIAGNOSIS — E119 Type 2 diabetes mellitus without complications: Secondary | ICD-10-CM | POA: Diagnosis not present

## 2017-10-15 DIAGNOSIS — D509 Iron deficiency anemia, unspecified: Secondary | ICD-10-CM | POA: Diagnosis not present

## 2017-10-15 DIAGNOSIS — Z992 Dependence on renal dialysis: Secondary | ICD-10-CM | POA: Diagnosis not present

## 2017-10-15 DIAGNOSIS — N2581 Secondary hyperparathyroidism of renal origin: Secondary | ICD-10-CM | POA: Diagnosis not present

## 2017-10-15 DIAGNOSIS — N186 End stage renal disease: Secondary | ICD-10-CM | POA: Diagnosis not present

## 2017-10-15 DIAGNOSIS — D631 Anemia in chronic kidney disease: Secondary | ICD-10-CM | POA: Diagnosis not present

## 2017-10-17 DIAGNOSIS — D509 Iron deficiency anemia, unspecified: Secondary | ICD-10-CM | POA: Diagnosis not present

## 2017-10-17 DIAGNOSIS — N2581 Secondary hyperparathyroidism of renal origin: Secondary | ICD-10-CM | POA: Diagnosis not present

## 2017-10-17 DIAGNOSIS — D631 Anemia in chronic kidney disease: Secondary | ICD-10-CM | POA: Diagnosis not present

## 2017-10-17 DIAGNOSIS — N186 End stage renal disease: Secondary | ICD-10-CM | POA: Diagnosis not present

## 2017-10-17 DIAGNOSIS — Z992 Dependence on renal dialysis: Secondary | ICD-10-CM | POA: Diagnosis not present

## 2017-10-19 DIAGNOSIS — N186 End stage renal disease: Secondary | ICD-10-CM | POA: Diagnosis not present

## 2017-10-19 DIAGNOSIS — D509 Iron deficiency anemia, unspecified: Secondary | ICD-10-CM | POA: Diagnosis not present

## 2017-10-19 DIAGNOSIS — D631 Anemia in chronic kidney disease: Secondary | ICD-10-CM | POA: Diagnosis not present

## 2017-10-19 DIAGNOSIS — N2581 Secondary hyperparathyroidism of renal origin: Secondary | ICD-10-CM | POA: Diagnosis not present

## 2017-10-19 DIAGNOSIS — Z992 Dependence on renal dialysis: Secondary | ICD-10-CM | POA: Diagnosis not present

## 2017-10-22 DIAGNOSIS — D509 Iron deficiency anemia, unspecified: Secondary | ICD-10-CM | POA: Diagnosis not present

## 2017-10-22 DIAGNOSIS — N186 End stage renal disease: Secondary | ICD-10-CM | POA: Diagnosis not present

## 2017-10-22 DIAGNOSIS — N2581 Secondary hyperparathyroidism of renal origin: Secondary | ICD-10-CM | POA: Diagnosis not present

## 2017-10-22 DIAGNOSIS — Z992 Dependence on renal dialysis: Secondary | ICD-10-CM | POA: Diagnosis not present

## 2017-10-22 DIAGNOSIS — D631 Anemia in chronic kidney disease: Secondary | ICD-10-CM | POA: Diagnosis not present

## 2017-10-24 DIAGNOSIS — N2581 Secondary hyperparathyroidism of renal origin: Secondary | ICD-10-CM | POA: Diagnosis not present

## 2017-10-24 DIAGNOSIS — D631 Anemia in chronic kidney disease: Secondary | ICD-10-CM | POA: Diagnosis not present

## 2017-10-24 DIAGNOSIS — Z992 Dependence on renal dialysis: Secondary | ICD-10-CM | POA: Diagnosis not present

## 2017-10-24 DIAGNOSIS — D509 Iron deficiency anemia, unspecified: Secondary | ICD-10-CM | POA: Diagnosis not present

## 2017-10-24 DIAGNOSIS — N186 End stage renal disease: Secondary | ICD-10-CM | POA: Diagnosis not present

## 2017-10-26 DIAGNOSIS — N2581 Secondary hyperparathyroidism of renal origin: Secondary | ICD-10-CM | POA: Diagnosis not present

## 2017-10-26 DIAGNOSIS — N186 End stage renal disease: Secondary | ICD-10-CM | POA: Diagnosis not present

## 2017-10-26 DIAGNOSIS — Z992 Dependence on renal dialysis: Secondary | ICD-10-CM | POA: Diagnosis not present

## 2017-10-26 DIAGNOSIS — D631 Anemia in chronic kidney disease: Secondary | ICD-10-CM | POA: Diagnosis not present

## 2017-10-26 DIAGNOSIS — D509 Iron deficiency anemia, unspecified: Secondary | ICD-10-CM | POA: Diagnosis not present

## 2017-10-29 DIAGNOSIS — N186 End stage renal disease: Secondary | ICD-10-CM | POA: Diagnosis not present

## 2017-10-29 DIAGNOSIS — N2581 Secondary hyperparathyroidism of renal origin: Secondary | ICD-10-CM | POA: Diagnosis not present

## 2017-10-29 DIAGNOSIS — D631 Anemia in chronic kidney disease: Secondary | ICD-10-CM | POA: Diagnosis not present

## 2017-10-29 DIAGNOSIS — D509 Iron deficiency anemia, unspecified: Secondary | ICD-10-CM | POA: Diagnosis not present

## 2017-10-29 DIAGNOSIS — Z992 Dependence on renal dialysis: Secondary | ICD-10-CM | POA: Diagnosis not present

## 2017-10-31 DIAGNOSIS — N186 End stage renal disease: Secondary | ICD-10-CM | POA: Diagnosis not present

## 2017-10-31 DIAGNOSIS — N2581 Secondary hyperparathyroidism of renal origin: Secondary | ICD-10-CM | POA: Diagnosis not present

## 2017-10-31 DIAGNOSIS — D509 Iron deficiency anemia, unspecified: Secondary | ICD-10-CM | POA: Diagnosis not present

## 2017-10-31 DIAGNOSIS — D631 Anemia in chronic kidney disease: Secondary | ICD-10-CM | POA: Diagnosis not present

## 2017-10-31 DIAGNOSIS — Z992 Dependence on renal dialysis: Secondary | ICD-10-CM | POA: Diagnosis not present

## 2017-11-02 DIAGNOSIS — D631 Anemia in chronic kidney disease: Secondary | ICD-10-CM | POA: Diagnosis not present

## 2017-11-02 DIAGNOSIS — D509 Iron deficiency anemia, unspecified: Secondary | ICD-10-CM | POA: Diagnosis not present

## 2017-11-02 DIAGNOSIS — Z992 Dependence on renal dialysis: Secondary | ICD-10-CM | POA: Diagnosis not present

## 2017-11-02 DIAGNOSIS — N2581 Secondary hyperparathyroidism of renal origin: Secondary | ICD-10-CM | POA: Diagnosis not present

## 2017-11-02 DIAGNOSIS — N186 End stage renal disease: Secondary | ICD-10-CM | POA: Diagnosis not present

## 2017-11-05 DIAGNOSIS — D509 Iron deficiency anemia, unspecified: Secondary | ICD-10-CM | POA: Diagnosis not present

## 2017-11-05 DIAGNOSIS — D631 Anemia in chronic kidney disease: Secondary | ICD-10-CM | POA: Diagnosis not present

## 2017-11-05 DIAGNOSIS — Z992 Dependence on renal dialysis: Secondary | ICD-10-CM | POA: Diagnosis not present

## 2017-11-05 DIAGNOSIS — N186 End stage renal disease: Secondary | ICD-10-CM | POA: Diagnosis not present

## 2017-11-05 DIAGNOSIS — N2581 Secondary hyperparathyroidism of renal origin: Secondary | ICD-10-CM | POA: Diagnosis not present

## 2017-11-06 DIAGNOSIS — N186 End stage renal disease: Secondary | ICD-10-CM | POA: Diagnosis not present

## 2017-11-06 DIAGNOSIS — Z992 Dependence on renal dialysis: Secondary | ICD-10-CM | POA: Diagnosis not present

## 2017-11-07 DIAGNOSIS — N186 End stage renal disease: Secondary | ICD-10-CM | POA: Diagnosis not present

## 2017-11-07 DIAGNOSIS — N2581 Secondary hyperparathyroidism of renal origin: Secondary | ICD-10-CM | POA: Diagnosis not present

## 2017-11-07 DIAGNOSIS — D509 Iron deficiency anemia, unspecified: Secondary | ICD-10-CM | POA: Diagnosis not present

## 2017-11-07 DIAGNOSIS — D631 Anemia in chronic kidney disease: Secondary | ICD-10-CM | POA: Diagnosis not present

## 2017-11-07 DIAGNOSIS — Z992 Dependence on renal dialysis: Secondary | ICD-10-CM | POA: Diagnosis not present

## 2017-11-09 DIAGNOSIS — N186 End stage renal disease: Secondary | ICD-10-CM | POA: Diagnosis not present

## 2017-11-09 DIAGNOSIS — Z992 Dependence on renal dialysis: Secondary | ICD-10-CM | POA: Diagnosis not present

## 2017-11-09 DIAGNOSIS — D509 Iron deficiency anemia, unspecified: Secondary | ICD-10-CM | POA: Diagnosis not present

## 2017-11-09 DIAGNOSIS — D631 Anemia in chronic kidney disease: Secondary | ICD-10-CM | POA: Diagnosis not present

## 2017-11-09 DIAGNOSIS — N2581 Secondary hyperparathyroidism of renal origin: Secondary | ICD-10-CM | POA: Diagnosis not present

## 2017-11-12 DIAGNOSIS — D509 Iron deficiency anemia, unspecified: Secondary | ICD-10-CM | POA: Diagnosis not present

## 2017-11-12 DIAGNOSIS — Z992 Dependence on renal dialysis: Secondary | ICD-10-CM | POA: Diagnosis not present

## 2017-11-12 DIAGNOSIS — D631 Anemia in chronic kidney disease: Secondary | ICD-10-CM | POA: Diagnosis not present

## 2017-11-12 DIAGNOSIS — N186 End stage renal disease: Secondary | ICD-10-CM | POA: Diagnosis not present

## 2017-11-12 DIAGNOSIS — N2581 Secondary hyperparathyroidism of renal origin: Secondary | ICD-10-CM | POA: Diagnosis not present

## 2017-11-14 DIAGNOSIS — N2581 Secondary hyperparathyroidism of renal origin: Secondary | ICD-10-CM | POA: Diagnosis not present

## 2017-11-14 DIAGNOSIS — N186 End stage renal disease: Secondary | ICD-10-CM | POA: Diagnosis not present

## 2017-11-14 DIAGNOSIS — D509 Iron deficiency anemia, unspecified: Secondary | ICD-10-CM | POA: Diagnosis not present

## 2017-11-14 DIAGNOSIS — Z992 Dependence on renal dialysis: Secondary | ICD-10-CM | POA: Diagnosis not present

## 2017-11-14 DIAGNOSIS — D631 Anemia in chronic kidney disease: Secondary | ICD-10-CM | POA: Diagnosis not present

## 2017-11-16 DIAGNOSIS — N186 End stage renal disease: Secondary | ICD-10-CM | POA: Diagnosis not present

## 2017-11-16 DIAGNOSIS — D509 Iron deficiency anemia, unspecified: Secondary | ICD-10-CM | POA: Diagnosis not present

## 2017-11-16 DIAGNOSIS — Z992 Dependence on renal dialysis: Secondary | ICD-10-CM | POA: Diagnosis not present

## 2017-11-16 DIAGNOSIS — D631 Anemia in chronic kidney disease: Secondary | ICD-10-CM | POA: Diagnosis not present

## 2017-11-16 DIAGNOSIS — N2581 Secondary hyperparathyroidism of renal origin: Secondary | ICD-10-CM | POA: Diagnosis not present

## 2017-11-19 DIAGNOSIS — N2581 Secondary hyperparathyroidism of renal origin: Secondary | ICD-10-CM | POA: Diagnosis not present

## 2017-11-19 DIAGNOSIS — N186 End stage renal disease: Secondary | ICD-10-CM | POA: Diagnosis not present

## 2017-11-19 DIAGNOSIS — Z992 Dependence on renal dialysis: Secondary | ICD-10-CM | POA: Diagnosis not present

## 2017-11-19 DIAGNOSIS — D509 Iron deficiency anemia, unspecified: Secondary | ICD-10-CM | POA: Diagnosis not present

## 2017-11-19 DIAGNOSIS — D631 Anemia in chronic kidney disease: Secondary | ICD-10-CM | POA: Diagnosis not present

## 2017-11-21 DIAGNOSIS — D509 Iron deficiency anemia, unspecified: Secondary | ICD-10-CM | POA: Diagnosis not present

## 2017-11-21 DIAGNOSIS — D631 Anemia in chronic kidney disease: Secondary | ICD-10-CM | POA: Diagnosis not present

## 2017-11-21 DIAGNOSIS — N186 End stage renal disease: Secondary | ICD-10-CM | POA: Diagnosis not present

## 2017-11-21 DIAGNOSIS — N2581 Secondary hyperparathyroidism of renal origin: Secondary | ICD-10-CM | POA: Diagnosis not present

## 2017-11-21 DIAGNOSIS — Z992 Dependence on renal dialysis: Secondary | ICD-10-CM | POA: Diagnosis not present

## 2017-11-23 DIAGNOSIS — Z992 Dependence on renal dialysis: Secondary | ICD-10-CM | POA: Diagnosis not present

## 2017-11-23 DIAGNOSIS — D631 Anemia in chronic kidney disease: Secondary | ICD-10-CM | POA: Diagnosis not present

## 2017-11-23 DIAGNOSIS — N186 End stage renal disease: Secondary | ICD-10-CM | POA: Diagnosis not present

## 2017-11-23 DIAGNOSIS — N2581 Secondary hyperparathyroidism of renal origin: Secondary | ICD-10-CM | POA: Diagnosis not present

## 2017-11-23 DIAGNOSIS — D509 Iron deficiency anemia, unspecified: Secondary | ICD-10-CM | POA: Diagnosis not present

## 2017-11-25 DIAGNOSIS — E11621 Type 2 diabetes mellitus with foot ulcer: Secondary | ICD-10-CM | POA: Diagnosis not present

## 2017-11-25 DIAGNOSIS — L03031 Cellulitis of right toe: Secondary | ICD-10-CM | POA: Diagnosis not present

## 2017-11-26 DIAGNOSIS — D509 Iron deficiency anemia, unspecified: Secondary | ICD-10-CM | POA: Diagnosis not present

## 2017-11-26 DIAGNOSIS — N2581 Secondary hyperparathyroidism of renal origin: Secondary | ICD-10-CM | POA: Diagnosis not present

## 2017-11-26 DIAGNOSIS — D631 Anemia in chronic kidney disease: Secondary | ICD-10-CM | POA: Diagnosis not present

## 2017-11-26 DIAGNOSIS — N186 End stage renal disease: Secondary | ICD-10-CM | POA: Diagnosis not present

## 2017-11-26 DIAGNOSIS — Z992 Dependence on renal dialysis: Secondary | ICD-10-CM | POA: Diagnosis not present

## 2017-11-27 ENCOUNTER — Encounter: Payer: Medicare Other | Attending: Physician Assistant | Admitting: Physician Assistant

## 2017-11-27 DIAGNOSIS — F419 Anxiety disorder, unspecified: Secondary | ICD-10-CM | POA: Insufficient documentation

## 2017-11-27 DIAGNOSIS — M069 Rheumatoid arthritis, unspecified: Secondary | ICD-10-CM | POA: Insufficient documentation

## 2017-11-27 DIAGNOSIS — Z87891 Personal history of nicotine dependence: Secondary | ICD-10-CM | POA: Diagnosis not present

## 2017-11-27 DIAGNOSIS — E11621 Type 2 diabetes mellitus with foot ulcer: Secondary | ICD-10-CM | POA: Insufficient documentation

## 2017-11-27 DIAGNOSIS — I120 Hypertensive chronic kidney disease with stage 5 chronic kidney disease or end stage renal disease: Secondary | ICD-10-CM | POA: Insufficient documentation

## 2017-11-27 DIAGNOSIS — L97512 Non-pressure chronic ulcer of other part of right foot with fat layer exposed: Secondary | ICD-10-CM | POA: Insufficient documentation

## 2017-11-27 DIAGNOSIS — E1122 Type 2 diabetes mellitus with diabetic chronic kidney disease: Secondary | ICD-10-CM | POA: Diagnosis not present

## 2017-11-27 DIAGNOSIS — F17210 Nicotine dependence, cigarettes, uncomplicated: Secondary | ICD-10-CM | POA: Diagnosis not present

## 2017-11-27 DIAGNOSIS — Z992 Dependence on renal dialysis: Secondary | ICD-10-CM | POA: Diagnosis not present

## 2017-11-27 DIAGNOSIS — N186 End stage renal disease: Secondary | ICD-10-CM | POA: Insufficient documentation

## 2017-11-27 DIAGNOSIS — L97511 Non-pressure chronic ulcer of other part of right foot limited to breakdown of skin: Secondary | ICD-10-CM | POA: Diagnosis not present

## 2017-11-27 DIAGNOSIS — E1151 Type 2 diabetes mellitus with diabetic peripheral angiopathy without gangrene: Secondary | ICD-10-CM | POA: Diagnosis not present

## 2017-11-28 DIAGNOSIS — Z992 Dependence on renal dialysis: Secondary | ICD-10-CM | POA: Diagnosis not present

## 2017-11-28 DIAGNOSIS — D509 Iron deficiency anemia, unspecified: Secondary | ICD-10-CM | POA: Diagnosis not present

## 2017-11-28 DIAGNOSIS — D631 Anemia in chronic kidney disease: Secondary | ICD-10-CM | POA: Diagnosis not present

## 2017-11-28 DIAGNOSIS — N186 End stage renal disease: Secondary | ICD-10-CM | POA: Diagnosis not present

## 2017-11-28 DIAGNOSIS — N2581 Secondary hyperparathyroidism of renal origin: Secondary | ICD-10-CM | POA: Diagnosis not present

## 2017-11-29 ENCOUNTER — Ambulatory Visit
Admission: RE | Admit: 2017-11-29 | Discharge: 2017-11-29 | Disposition: A | Discharge status: Home / Self Care | Payer: Medicare Other | Source: Ambulatory Visit | Attending: Physician Assistant | Admitting: Physician Assistant

## 2017-11-29 ENCOUNTER — Other Ambulatory Visit: Payer: Self-pay | Admitting: Physician Assistant

## 2017-11-29 DIAGNOSIS — B999 Unspecified infectious disease: Secondary | ICD-10-CM

## 2017-11-29 DIAGNOSIS — M7989 Other specified soft tissue disorders: Secondary | ICD-10-CM | POA: Insufficient documentation

## 2017-11-29 NOTE — Progress Notes (Signed)
BENNO, BRENSINGER ([id number]) Visit Report for 11/27/2017 Allergy List Details Patient Name: Timothy Gibson, Timothy Gibson Date of Service: 11/27/2017 2:15 PM Medical Record Number: [id number] Patient Account Number: [id number] Date of Birth/Sex: 1950/08/10 (67 y.o. Male) Treating RN: Roger Shelter Primary Care Dianely Krehbiel: PATIENT, NO Other Clinician: Referring Tobiah Celestine: Konrad Felix Treating Mostafa Yuan/Extender: [name], [name] Weeks in Treatment: 0 Allergies Active Allergies no known allergies Allergy Notes Electronic Signature(s) Signed: 11/28/2017 7:52:46 AM By: Roger Shelter Entered By: Roger Shelter on 11/27/2017 14:29:17 Timothy Gibson ([id number]) -------------------------------------------------------------------------------- Arrival Information Details Patient Name: Timothy Gibson Date of Service: 11/27/2017 2:15 PM Medical Record Number: [id number] Patient Account Number: [id number] Date of Birth/Sex: 22-Aug-1950 (67 y.o. Male) Treating RN: Roger Shelter Primary Care Lennyn Gange: PATIENT, NO Other Clinician: Referring Alexia Dinger: Konrad Felix Treating Prentis Langdon/Extender: Melburn Hake, [name] Weeks in Treatment: 0 Visit Information Patient Arrived: Ambulatory Arrival Time: 14:22 Accompanied By: caregiver Transfer Assistance: None Patient Identification Verified: Yes Secondary Verification Process Completed: Yes Electronic Signature(s) Signed: 11/28/2017 7:52:46 AM By: Roger Shelter Entered By: Roger Shelter on 11/27/2017 14:24:28 Timothy Gibson ([id number]) -------------------------------------------------------------------------------- Clinic Level of Care Assessment Details Patient Name: Timothy Gibson Date of Service: 11/27/2017 2:15 PM Medical Record Number: [id number] Patient Account Number: [id number] Date of Birth/Sex: Nov 11, 1950 (67 y.o. Male) Treating RN: Roger Shelter Primary Care Lyndsy Gilberto: PATIENT, NO Other Clinician: Referring Shea Kapur:  Konrad Felix Treating Man Effertz/Extender: [name], [name] Weeks in Treatment: 0 Clinic Level of Care Assessment Items TOOL 2 Quantity Score []  - Use when only an EandM is performed on the INITIAL visit 0 ASSESSMENTS - Nursing Assessment / Reassessment X - General Physical Exam (combine w/ comprehensive assessment (listed just below) when 1 20 performed on new pt. evals) X- 1 25 Comprehensive Assessment (HX, ROS, Risk Assessments, Wounds Hx, etc.) ASSESSMENTS - Wound and Skin Assessment / Reassessment X - Simple Wound Assessment / Reassessment - one wound 1 5 []  - 0 Complex Wound Assessment / Reassessment - multiple wounds []  - 0 Dermatologic / Skin Assessment (not related to wound area) ASSESSMENTS - Ostomy and/or Continence Assessment and Care []  - Incontinence Assessment and Management 0 []  - 0 Ostomy Care Assessment and Management (repouching, etc.) PROCESS - Coordination of Care X - Simple Patient / Family Education for ongoing care 1 15 []  - 0 Complex (extensive) Patient / Family Education for ongoing care []  - 0 Staff obtains Programmer, systems, Records, Test Results / Process Orders []  - 0 Staff telephones HHA, Nursing Homes / Clarify orders / etc []  - 0 Routine Transfer to another Facility (non-emergent condition) []  - 0 Routine Hospital Admission (non-emergent condition) []  - 0 New Admissions / Biomedical engineer / Ordering NPWT, Apligraf, etc. []  - 0 Emergency Hospital Admission (emergent condition) X- 1 10 Simple Discharge Coordination []  - 0 Complex (extensive) Discharge Coordination PROCESS - Special Needs []  - Pediatric / Minor Patient Management 0 []  - 0 Isolation Patient Management Timothy Gibson, Timothy Gibson ([id number]) []  - 0 Hearing / Language / Visual special needs []  - 0 Assessment of Community assistance (transportation, D/C planning, etc.) []  - 0 Additional assistance / Altered mentation []  - 0 Support Surface(s) Assessment (bed, cushion, seat,  etc.) INTERVENTIONS - Wound Cleansing / Measurement X - Wound Imaging (photographs - any number of wounds) 1 5 []  - 0 Wound Tracing (instead of photographs) X- 1 5 Simple Wound Measurement - one wound []  - 0 Complex Wound Measurement - multiple wounds X- 1 5 Simple Wound Cleansing - one wound []  - 0 Complex Wound  Cleansing - multiple wounds INTERVENTIONS - Wound Dressings []  - Small Wound Dressing one or multiple wounds 0 X- 1 15 Medium Wound Dressing one or multiple wounds []  - 0 Large Wound Dressing one or multiple wounds []  - 0 Application of Medications - injection INTERVENTIONS - Miscellaneous []  - External ear exam 0 []  - 0 Specimen Collection (cultures, biopsies, blood, body fluids, etc.) []  - 0 Specimen(s) / Culture(s) sent or taken to Lab for analysis []  - 0 Patient Transfer (multiple staff / Harrel Lemon Lift / Similar devices) []  - 0 Simple Staple / Suture removal (25 or less) []  - 0 Complex Staple / Suture removal (26 or more) []  - 0 Hypo / Hyperglycemic Management (close monitor of Blood Glucose) X- 1 15 Ankle / Brachial Index (ABI) - do not check if billed separately Has the patient been seen at the hospital within the last three years: Yes Total Score: 120 Level Of Care: New/Established - Level 4 Electronic Signature(s) Signed: 11/28/2017 7:52:46 AM By: Roger Shelter Entered By: Roger Shelter on 11/27/2017 15:35:27 Timothy Gibson ([id number]) -------------------------------------------------------------------------------- Encounter Discharge Information Details Patient Name: Timothy Gibson Date of Service: 11/27/2017 2:15 PM Medical Record Number: [id number] Patient Account Number: [id number] Date of Birth/Sex: March 25, 1951 (67 y.o. Male) Treating RN: Roger Shelter Primary Care Othmar Ringer: PATIENT, NO Other Clinician: Referring Cheyann Blecha: Konrad Felix Treating Marili Vader/Extender: Melburn Hake, [name] Weeks in Treatment: 0 Encounter Discharge  Information Items Discharge Condition: Stable Ambulatory Status: Ambulatory Discharge Destination: Home Transportation: Private Auto Schedule Follow-up Appointment: Yes Clinical Summary of Care: Electronic Signature(s) Signed: 11/28/2017 7:52:46 AM By: Roger Shelter Entered By: Roger Shelter on 11/27/2017 16:03:36 Timothy Gibson ([id number]) -------------------------------------------------------------------------------- Lower Extremity Assessment Details Patient Name: Timothy Gibson Date of Service: 11/27/2017 2:15 PM Medical Record Number: [id number] Patient Account Number: [id number] Date of Birth/Sex: Apr 09, 1951 (68 y.o. Male) Treating RN: Roger Shelter Primary Care Yacqub Baston: PATIENT, NO Other Clinician: Referring Kadin Canipe: Konrad Felix Treating Savior Himebaugh/Extender: [name], [name] Weeks in Treatment: 0 Edema Assessment Assessed: [Left: No] [Right: No] Edema: [Left: Yes] [Right: Yes] Calf Left: Right: Point of Measurement: 40 cm From Medial Instep 41.5 cm 42 cm Ankle Left: Right: Point of Measurement: 12 cm From Medial Instep 28.4 cm 31.4 cm Vascular Assessment Claudication: Claudication Assessment [Left:None] [Right:None] Pulses: Posterior Tibial Blood Pressure: Electronic Signature(s) Signed: 11/28/2017 7:52:46 AM By: Roger Shelter Entered By: Roger Shelter on 11/27/2017 14:59:40 Timothy Gibson ([id number]) -------------------------------------------------------------------------------- Multi Wound Chart Details Patient Name: Timothy Gibson Date of Service: 11/27/2017 2:15 PM Medical Record Number: [id number] Patient Account Number: [id number] Date of Birth/Sex: 07/15/1950 (67 y.o. 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] [2:Lower Extremity] [N/A:N/A] Comorbid History: [1:Lymphedema, Arrhythmia, Hypertension, Type II Diabetes, End Stage Renal Disease, Rheumatoid Arthritis, Disease, Rheumatoid Arthritis, Osteoarthritis, Confinement Anxiety] [2:Lymphedema, Arrhythmia, Hypertension, Type II Diabetes, End  Stage Renal Osteoarthritis, Confinement Anxiety] [N/A:N/A] Date Acquired: [1:11/22/2017] [2:11/26/2017] [N/A:N/A] Weeks of Treatment: [1:0] [2:0] [N/A:N/A] Wound Status: [1:Open] [2:Open] [N/A:N/A] Measurements L x W x D [1:1.5x2x0.1] [2:0.9x0.2x0.2] [N/A:N/A] (cm) Area (cm) : [1:2.356] [2:0.141] [N/A:N/A] Volume (  cm) : [1:0.236] [2:0.028] [N/A:N/A] Classification: [1:Unable to visualize wound bed Grade 1] [N/A:N/A] Exudate Amount: [1:Small] [2:Medium] [N/A:N/A] Exudate Type: [1:Serosanguineous] [2:Serosanguineous] [N/A:N/A] Exudate Color: [1:red, brown] [2:red, brown] [N/A:N/A] Wound Margin: [1:Flat and Intact] [2:Distinct, outline attached] [N/A:N/A] Granulation Amount: [1:None Present (0%)] [2:Large (67-100%)] [N/A:N/A] Granulation Quality: [1:N/A] [2:Red] [N/A:N/A] Necrotic Amount: [1:Large (67-100%)] [2:Small (1-33%)] [N/A:N/A] Exposed Structures: [1:Fascia: No Fat Layer (Subcutaneous Tissue) Exposed: No Tendon: No Muscle: No Joint: No Bone: No] [2:Fat Layer (Subcutaneous Tissue) Exposed: Yes Fascia: No Tendon: No Muscle: No Joint: No Bone: No] [N/A:N/A] Epithelialization: [1:N/A] [2:None] [N/A:N/A] Periwound Skin Texture: [1:Excoriation: No Induration: No] [2:Excoriation: No Induration: No]  [N/A:N/A] Callus: No Callus: No Crepitus: No Crepitus: No Rash: No Rash: No Scarring: No Scarring: No Periwound Skin Moisture: Maceration: Yes Maceration: No N/A Dry/Scaly: No Dry/Scaly: No Periwound Skin Color: Atrophie Blanche: No Atrophie Blanche: No N/A Cyanosis: No Cyanosis: No Ecchymosis: No Ecchymosis: No Erythema: No Erythema: No Hemosiderin Staining: No Hemosiderin Staining: No Mottled: No Mottled: No Pallor: No Pallor: No Rubor: No Rubor: No Temperature: No Abnormality No Abnormality N/A Tenderness on Palpation: No No N/A Wound Preparation: Ulcer Cleansing: Ulcer Cleansing: N/A Rinsed/Irrigated with Saline Rinsed/Irrigated with Saline Topical Anesthetic Applied: Topical Anesthetic Applied: Other: lidocaine 4% Other: lidocaine 4% Treatment Notes Electronic Signature(s) Signed: 11/28/2017 7:52:46 AM By: Roger Shelter Entered By: Roger Shelter on 11/27/2017 15:26:59 Timothy Gibson ([id number]) -------------------------------------------------------------------------------- Multi-Disciplinary Care Plan Details Patient Name: Timothy Gibson Date of Service: 11/27/2017 2:15 PM Medical Record Number: [id number] Patient Account Number: [id number] Date of Birth/Sex: Nov 07, 1950 (67 y.o. Male) Treating RN: Roger Shelter Primary Care Deboraha Goar: PATIENT, NO Other Clinician: Referring Maudell Stanbrough: Konrad Felix Treating Jamare Vanatta/Extender: [name], [name] Weeks in Treatment: 0 Active Inactive ` Abuse / Safety / Falls / Self Care Management Nursing Diagnoses: Potential for falls Goals: Patient will remain injury free related to falls Date Initiated: 11/27/2017 Target Resolution Date: 12/14/2017 Goal Status: Active Interventions: Assess fall risk on admission and as needed Notes: ` Nutrition Nursing Diagnoses: Impaired glucose control: actual or potential Goals: Patient/caregiver verbalizes understanding of need to maintain therapeutic glucose  control per primary care physician Date Initiated: 11/27/2017 Target Resolution Date: 12/28/2017 Goal Status: Active Interventions: Provide education on elevated blood sugars and impact on wound healing Notes: ` Orientation to the Wound Care Program Nursing Diagnoses: Knowledge deficit related to the wound healing center program Goals: Patient/caregiver will verbalize understanding of the [place] Date Initiated: 11/27/2017 Target Resolution Date: 12/28/2017 Goal Status: Active Interventions: Timothy Gibson, Timothy Gibson ([id number]) Provide education on orientation to the wound center Notes: ` Soft Tissue Infection Nursing Diagnoses: Potential for infection: soft tissue Goals: Patient will remain free of wound infection Date Initiated: 11/27/2017 Target Resolution Date: 12/28/2017 Goal Status: Active Interventions: Assess signs and symptoms of infection every visit Treatment Activities: Systemic antibiotics : 11/27/2017 Notes: ` Wound/Skin Impairment Nursing Diagnoses: Impaired tissue integrity Goals: Ulcer/skin breakdown will have a volume reduction of 80% by week 12 Date Initiated: 11/27/2017 Target Resolution Date: 02/27/2018 Goal Status: Active Interventions: Assess patient/caregiver ability to perform ulcer/skin care regimen upon admission and as needed Treatment Activities: Referred to DME [facility] for dressing supplies : 11/27/2017 Skin care regimen initiated : 11/27/2017 Notes: Electronic Signature(s) Signed: 11/28/2017 7:52:46 AM By: Roger Shelter Entered By: Roger Shelter on 11/27/2017 15:26:39 Timothy Gibson ([id number]) -------------------------------------------------------------------------------- Pain Assessment Details Patient Name: Timothy Gibson Date of Service: 11/27/2017 2:15 PM Medical Record Number: [id number] Patient Account Number: [id number] Date of Birth/Sex: April 23, 1951 (67 y.o. Male)  Treating RN: Roger Shelter Primary  Care Stan Cantave: PATIENT, NO Other Clinician: Referring Hansini Clodfelter: Konrad Felix Treating Jalaine Riggenbach/Extender: [name], [name] Weeks in Treatment: 0 Active Problems Location of Pain Severity and Description of Pain Patient Has Paino No Site Locations Duration of the Pain. Constant / Intermittento Intermittent Rate the pain. Current Pain Level: 2 Character of Pain Describe the Pain: Aching, Dull Pain Management and Medication Current Pain Management: Electronic Signature(s) Signed: 11/28/2017 7:52:46 AM By: Roger Shelter Entered By: Roger Shelter on 11/27/2017 14:26:11 Timothy Gibson ([id number]) -------------------------------------------------------------------------------- Patient/Caregiver Education Details Patient Name: Timothy Gibson Date of Service: 11/27/2017 2:15 PM Medical Record Number: [id number] Patient Account Number: [id number] Date of Birth/Gender: Feb 14, 1951 (67 y.o. Male) Treating RN: Roger Shelter Primary Care Physician: PATIENT, NO Other Clinician: Referring Physician: Konrad Felix Treating Physician/Extender: Sharalyn Ink in Treatment: 0 Education Assessment Education Provided To: Patient and Caregiver Education Topics Provided Welcome To [facility]: Handouts: Welcome To [facility] Methods: Explain/Verbal Responses: State content correctly Wound Debridement: Handouts: Wound Debridement Methods: Explain/Verbal Responses: State content correctly Wound/Skin Impairment: Handouts: Caring for Your Ulcer Methods: Explain/Verbal Responses: State content correctly Electronic Signature(s) Signed: 11/28/2017 7:52:46 AM By: Roger Shelter Entered By: Roger Shelter on 11/27/2017 16:04:00 Timothy Gibson ([id number]) -------------------------------------------------------------------------------- Wound Assessment Details Patient Name: Timothy Gibson Date of Service: 11/27/2017 2:15 PM Medical Record  Number: [id number] Patient Account Number: [id number] Date of Birth/Sex: October 15, 1950 (67 y.o. Male) Treating RN: Roger Shelter Primary Care Rikia Sukhu: PATIENT, NO Other Clinician: Referring Ikeem Cleckler: Konrad Felix Treating Dason Mosley/Extender: [name], [name] Weeks in Treatment: 0 Wound Status Wound Number: 1 Primary Diabetic Wound/Ulcer of the Lower Extremity Etiology: Wound Location: Right Toe Great Wound Open Wounding Event: Gradually Appeared Status: Date Acquired: 11/22/2017 Comorbid Lymphedema, Arrhythmia, Hypertension, Type II Weeks Of Treatment: 0 History: Diabetes, End Stage Renal Disease, Clustered Wound: No Rheumatoid Arthritis, Osteoarthritis, Confinement Anxiety Photos Photo Uploaded By: Roger Shelter on 11/28/2017 16:13:06 Wound Measurements Length: (cm) Width: (cm) Depth: (cm) Area: (cm) Volume: (cm) 1.5 % Reduction in Area: 2 % Reduction in Volume: 0.1 2.356 0.236 Wound Description Classification: Unable to visualize wound bed Wound Margin: Flat and Intact Exudate Amount: Small Exudate Type: Serosanguineous Exudate Color: red, brown Foul Odor After Cleansing: No Slough/Fibrino Yes Wound Bed Granulation Amount: None Present (0%) Exposed Structure Necrotic Amount: Large (67-100%) Fascia Exposed: No Necrotic Quality: Adherent Slough Fat Layer (Subcutaneous Tissue) Exposed: No Tendon Exposed: No Muscle Exposed: No Joint Exposed: No Bone Exposed: No Timothy Gibson, Timothy Gibson. ([id number]) Periwound Skin Texture Texture Color No Abnormalities Noted: No No Abnormalities Noted: No Callus: No Atrophie Blanche: No Crepitus: No Cyanosis: No Excoriation: No Ecchymosis: No Induration: No Erythema: No Rash: No Hemosiderin Staining: No Scarring: No Mottled: No Pallor: No Moisture Rubor: No No Abnormalities Noted: No Dry / Scaly: No Temperature / Pain Maceration: Yes Temperature: No Abnormality Wound Preparation Ulcer Cleansing:  Rinsed/Irrigated with Saline Topical Anesthetic Applied: Other: lidocaine 4%, Treatment Notes Wound #1 (Right Toe Great) 1. Cleansed with: Clean wound with Normal Saline 2. Anesthetic Topical Lidocaine 4% cream to wound bed prior to debridement 4. Dressing Applied: Santyl Ointment 5. Secondary Dressing Applied Dry Gauze Notes conform Electronic Signature(s) Signed: 11/28/2017 7:52:46 AM By: Roger Shelter Entered By: Roger Shelter on 11/27/2017 14:56:14 Timothy Gibson ([id number]) -------------------------------------------------------------------------------- Wound Assessment Details Patient Name: Timothy Gibson Date of Service: 11/27/2017 2:15 PM Medical Record Number: [id number] Patient Account Number: [id number] Date of Birth/Sex: [birth year] (67 y.o. Male) Treating RN: Roger Shelter  Primary Care Tevyn Codd: PATIENT, NO Other Clinician: Referring Faelynn Wynder: Konrad Felix Treating Dontray Haberland/Extender: [name], [name] Weeks in Treatment: 0 Wound Status Wound Number: 2 Primary Diabetic Wound/Ulcer of the Lower Extremity Etiology: Wound Location: Right Toe - Web between 3rd and 4th Wound Open Wounding Event: Gradually Appeared Status: Date Acquired: 11/26/2017 Comorbid Lymphedema, Arrhythmia, Hypertension, Type II Weeks Of Treatment: 0 History: Diabetes, End Stage Renal Disease, Clustered Wound: No Rheumatoid Arthritis, Osteoarthritis, Confinement Anxiety Photos Photo Uploaded By: Roger Shelter on 11/28/2017 16:13:06 Wound Measurements Length: (cm) 0.9 Width: (cm) 0.2 Depth: (cm) 0.2 Area: (cm) 0.141 Volume: (cm) 0.028 % Reduction in Area: % Reduction in Volume: Epithelialization: None Tunneling: No Undermining: No Wound Description Classification: Grade 1 Wound Margin: Distinct, outline attached Exudate Amount: Medium Exudate Type: Serosanguineous Exudate Color: red, brown Foul Odor After Cleansing: No Slough/Fibrino Yes Wound  Bed Granulation Amount: Large (67-100%) Exposed Structure Granulation Quality: Red Fascia Exposed: No Necrotic Amount: Small (1-33%) Fat Layer (Subcutaneous Tissue) Exposed: Yes Tendon Exposed: No Muscle Exposed: No Joint Exposed: No Bone Exposed: No Timothy Gibson, Timothy Gibson. ([id number]) Periwound Skin Texture Texture Color No Abnormalities Noted: No No Abnormalities Noted: No Callus: No Atrophie Blanche: No Crepitus: No Cyanosis: No Excoriation: No Ecchymosis: No Induration: No Erythema: No Rash: No Hemosiderin Staining: No Scarring: No Mottled: No Pallor: No Moisture Rubor: No No Abnormalities Noted: No Dry / Scaly: No Temperature / Pain Maceration: No Temperature: No Abnormality Wound Preparation Ulcer Cleansing: Rinsed/Irrigated with Saline Topical Anesthetic Applied: Other: lidocaine 4%, Treatment Notes Wound #2 (Right Toe - Web between 3rd and 4th) 1. Cleansed with: Clean wound with Normal Saline 2. Anesthetic Topical Lidocaine 4% cream to wound bed prior to debridement 4. Dressing Applied: Other dressing (specify in notes) 5. Secondary Dressing Applied Dry Gauze Notes silvercell, darko off loading shoe Electronic Signature(s) Signed: 11/28/2017 7:52:46 AM By: Roger Shelter Entered By: Roger Shelter on 11/27/2017 15:01:49 Timothy Gibson ([id number]) -------------------------------------------------------------------------------- Vitals Details Patient Name: Timothy Gibson Date of Service: 11/27/2017 2:15 PM Medical Record Number: [id number] Patient Account Number: [id number] Date of Birth/Sex: 1950/09/04 (67 y.o. Male) Treating RN: Roger Shelter Primary Care Dana Debo: PATIENT, NO Other Clinician: Referring Reyonna Haack: Konrad Felix Treating Akanksha Bellmore/Extender: [name], [name] Weeks in Treatment: 0 Vital Signs Time Taken: 14:26 Temperature (F): 98.4 Height (in): 76 Pulse (bpm): 87 Source: Stated Respiratory Rate (breaths/min):  18 Weight (lbs): 220 Blood Pressure (mmHg): 188/89 Source: Stated Reference Range: 80 - 120 mg / dl Body Mass Index (BMI): 26.8 Electronic Signature(s) Signed: 11/28/2017 7:52:46 AM By: Roger Shelter Entered By: Roger Shelter on 11/27/2017 14:27:09

## 2017-11-29 NOTE — Progress Notes (Signed)
Timothy Gibson ([id number]) Visit Report for 11/27/2017 Chief Complaint Document Details Patient Name: Timothy Gibson, Timothy Gibson Date of Service: 11/27/2017 2:15 PM Medical Record Number: [id number] Patient Account Number: [id number] Date of Birth/Sex: [birth year] (66 y.o. Male) Treating RN: Cornell Barman Primary Care Provider: PATIENT, NO Other Clinician: Referring Provider: Konrad Felix Treating Provider/Extender: Worthy Keeler Weeks in Treatment: 0 Information Obtained from: Patient Chief Complaint Right Great Toe and Right 3/4th Toe webspace ulcers Electronic Signature(s) Signed: 11/27/2017 6:53:41 PM By: Worthy Keeler PA-C Entered By: Worthy Keeler on 11/27/2017 15:47:49 Timothy Gibson ([id number]) -------------------------------------------------------------------------------- Debridement Details Patient Name: Timothy Gibson Date of Service: 11/27/2017 2:15 PM Medical Record Number: [id number] Patient Account Number: [id number] Date of Birth/Sex: 1951-05-26 (67 y.o. Male) Treating RN: Roger Shelter Primary Care Provider: PATIENT, NO Other Clinician: Referring Provider: Konrad Felix Treating Provider/Extender: Melburn Hake, [name] Weeks in Treatment: 0 Debridement Performed for Wound #1 Right Toe Great Assessment: Performed By: Physician [name], [name] E., PA-C Debridement Type: Debridement Severity of Tissue Pre Limited to breakdown of skin Debridement: Pre-procedure Verification/Time Yes - 15:27 Out Taken: Start Time: 15:27 Pain Control: Other : lidocaine 4% Total Area Debrided (L x W): 1.5 (cm) x 2 (cm) = 3 (cm) Tissue and other material Non-Viable, Skin: Dermis debrided: Level: Skin/Dermis Debridement Description: Selective/Open Wound Instrument: Curette Bleeding: None End Time: 15:38 Procedural Pain: 0 Post Procedural Pain: 0 Response to Treatment: Procedure was tolerated well Level of Consciousness: Awake and Alert Post Procedure  Vitals: Temperature: 98.4 Pulse: 87 Respiratory Rate: 18 Blood Pressure: Systolic Blood Pressure: 628 Diastolic Blood Pressure: 89 Post Debridement Measurements of Total Wound Length: (cm) 1.5 Width: (cm) 2 Depth: (cm) 0.1 Volume: (cm) 0.236 Character of Wound/Ulcer Post Debridement: Stable Severity of Tissue Post Debridement: Limited to breakdown of skin Post Procedure Diagnosis Same as Pre-procedure Electronic Signature(s) Signed: 11/27/2017 6:53:41 PM By: Worthy Keeler PA-C Signed: 11/28/2017 7:52:46 AM By: Roger Shelter Entered By: Roger Shelter on 11/27/2017 15:29:56 NIKOS, ANGLEMYER ([id number]) COSTANTINO, KOHLBECK ([id number]) -------------------------------------------------------------------------------- HPI Details Patient Name: Timothy Gibson Date of Service: 11/27/2017 2:15 PM Medical Record Number: [id number] Patient Account Number: [id number] Date of Birth/Sex: 1950/12/31 (67 y.o. Male) Treating RN: Cornell Barman Primary Care Provider: PATIENT, NO Other Clinician: Referring Provider: Konrad Felix Treating Provider/Extender: [name], [name] Weeks in Treatment: 0 History of Present Illness HPI Description: 11/27/17 on evaluation today patient presents for initial evaluation concerning an issue that he has been having with his right great toe which began last Thursday 11/22/17. He has a history of diabetes mellitus type to which he has had for greater than 30 years currently he is on insulin. Subsequently he also has a history of hypertension. On physical exam inspection is also appears he may have some peripheral vascular disease with his ABI's being noncompressible bilaterally. He is on dialysis as well and this is on Monday, Wednesday, and Friday. Patient was hospitalized back in January/February 2019 although this was more for stomach/back pain though they never told me exactly what was going on. This is according to the patient. Subsequently the ulcer  which is between the third and fourth toe when space of the right foot as well is on the plantar aspect of the fourth toe is due to him having cleaned his toes this morning and he states that his finger which is large split the toe tissue in between causing the wounds that we currently see. With that being said he states this has happened before  I explained I would definitely recommend that he not do this any longer. He can use a small soft washcloth to clean between the toes without causing trauma. Subsequently the right great toe hatchery does show evidence of necrotic tissue present on the surface of the wound specifically there is callous and dead skin surrounding which is trapping fluid causing problems as far as the wound is concerned. The surface of the wound does show slough although due to his vascular flow I'm not going to sharply debride this today I think I will selectively debride away the necrotic skin as well is callous from around the surrounding so this will not continue to be a moisture issue. Nonetheless the patient has no pain he does have diabetic neuropathy. Electronic Signature(s) Signed: 11/27/2017 6:53:41 PM By: Worthy Keeler PA-C Entered By: Worthy Keeler on 11/27/2017 15:53:17 DALAN, COWGER ([id number]) -------------------------------------------------------------------------------- Physical Exam Details Patient Name: Timothy Gibson Date of Service: 11/27/2017 2:15 PM Medical Record Number: [id number] Patient Account Number: [id number] Date of Birth/Sex: [birth year] (67 y.o. Male) Treating RN: Cornell Barman Primary Care Provider: PATIENT, NO Other Clinician: Referring Provider: Konrad Felix Treating Provider/Extender: [name], [name] Weeks in Treatment: 0 Constitutional patient is hypertensive.. pulse regular and within target range for patient.Marland Kitchen respirations regular, non-labored and within target range for patient.Marland Kitchen temperature within target range for  patient.. Well-nourished and well-hydrated in no acute distress. Eyes conjunctiva clear no eyelid edema noted. pupils equal round and reactive to light and accommodation. Ears, Nose, Mouth, and Throat no gross abnormality of ear auricles or external auditory canals. normal hearing noted during conversation. mucus membranes moist. Respiratory normal breathing without difficulty. clear to auscultation bilaterally. Cardiovascular regular rate and rhythm with normal S1, S2. Absent posterior tibial and dorsalis pedis pulses bilateral lower extremities. no clubbing, cyanosis, significant edema, <3 sec cap refill. Gastrointestinal (GI) soft, non-tender, non-distended, +BS. no ventral hernia noted. Musculoskeletal Patient unable to walk without assistance. Psychiatric this patient is able to make decisions and demonstrates good insight into disease process. Alert and Oriented x 3. pleasant and cooperative. Notes On evaluation today patient's wound shows necrotic slough on the surface of the distal portion of his right great toe there was necrotic skin as well of callous noted surrounding the wound bed. This appears to be trapping fluid and did require sharp debridement today. Patient tolerated this without any pain whatsoever and there was minimal bleeding noted. With that being said patient's blood pressure is elevated at this time which does have me somewhat concerned concerning his diabetes and the fact that he is definitely still a current smoker. I did discuss smoking with him today as well. I explained that even though she "doesn't inhale" and only smoke cigars occasionally I still think it would be best for him to get this up completely. He was somewhat taken aback by this I felt however I believe that it is the most appropriate recommendation at this point. I did spend 3 to 5 minutes discussion about this. Electronic Signature(s) Signed: 11/27/2017 6:53:41 PM By: Worthy Keeler  PA-C Entered By: Worthy Keeler on 11/27/2017 15:54:54 Timothy Gibson ([id number]) -------------------------------------------------------------------------------- Physician Orders Details Patient Name: Timothy Gibson Date of Service: 11/27/2017 2:15 PM Medical Record Number: [id number] Patient Account Number: [id number] Date of Birth/Sex: [birth year] (67 y.o. Male) Treating RN: Roger Shelter Primary Care Provider: PATIENT, NO Other Clinician: Referring Provider: Konrad Felix Treating Provider/Extender: [name], [name] Weeks in Treatment: 0 Verbal / Phone Orders: No Diagnosis  Coding Wound Cleansing Wound #1 Right Toe Great o Cleanse wound with mild soap and water Wound #2 Right Toe - Web between 3rd and 4th o Cleanse wound with mild soap and water Anesthetic (add to Medication List) Wound #1 Right Toe Great o Topical Lidocaine 4% cream applied to wound bed prior to debridement (In Clinic Only). Wound #2 Right Toe - Web between 3rd and 4th o Topical Lidocaine 4% cream applied to wound bed prior to debridement (In Clinic Only). Primary Wound Dressing Wound #1 Right Toe Great o Santyl Ointment Wound #2 Right Toe - Web between 3rd and 4th o Silver Alginate Secondary Dressing Wound #1 Right Toe Great o Gauze and Kerlix/Conform Wound #2 Right Toe - Web between 3rd and 4th o Gauze and Kerlix/Conform Dressing Change Frequency Wound #1 Right Toe Great o Three times weekly Wound #2 Right Toe - Web between 3rd and 4th o Three times weekly Follow-up Appointments Wound #1 Right Toe Great o Return Appointment in 1 week. Wound #2 Right Toe - Web between 3rd and 4th o Return Appointment in 1 week. Edema Control PATTY, LOPEZGARCIA ([id number]) Wound #1 Right Toe Great o Elevate legs to the level of the heart and pump ankles as often as possible Wound #2 Right Toe - Web between 3rd and 4th o Elevate legs to the level of the heart and pump ankles  as often as possible Off-Loading Wound #1 Right Toe Great o Open toe surgical shoe to: Wound #2 Right Toe - Web between 3rd and 4th o Open toe surgical shoe to: Laboratory o Hemoglobin A1c (glycated HgB)/Hemoglobin.total in Blood (CHEM) - [facility] LOINC Code: 0973-5 oooo Convenience Name: Hb A1c -Method not specified o Albumin [Mass/volume] in serum or plasma (CHEM) - [facility] LOINC Code: 3299-2 oooo Convenience Name: Albumin in serum or plasma Radiology o X-ray, foot - Concentration on Great toe and in between 3-4th toe Services and Therapies o Arterial Studies- Bilateral - [facility] Patient Medications Allergies: no known allergies Notifications Medication Indication Start End Santyl 11/27/2017 DOSE topical 250 unit/gram ointment - ointment topical applied nickel thick to the right distal great toe daily and cover with dressing as directed Notes Request Labs from [facility]) Signed: 11/27/2017 5:25:47 PM By: Gretta Cool, BSN, RN, CWS, Kim RN, BSN Signed: 11/27/2017 6:53:41 PM By: Worthy Keeler PA-C Previous Signature: 11/27/2017 3:50:16 PM Version By: Worthy Keeler PA-C Entered By: Gretta Cool, BSN, RN, CWS, Kim on 11/27/2017 17:19:18 Timothy Gibson ([id number]) -------------------------------------------------------------------------------- Problem List Details Patient Name: Timothy Gibson Date of Service: 11/27/2017 2:15 PM Medical Record Number: [id number] Patient Account Number: [id number] Date of Birth/Sex: 1951-02-07 (67 y.o. 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 Male) Treating RN: Cornell Barman Primary Care Provider: PATIENT, NO Other  Clinician: Referring Provider: Konrad Felix Treating Provider/Extender: Melburn Hake, [name] Weeks in Treatment: 0 Subjective Chief Complaint Information obtained from Patient Right Great Toe and Right 3/4th Toe webspace ulcers History of Present Illness (HPI) 11/27/17 on evaluation today patient presents for initial evaluation concerning an issue that he has been having with his right great toe which began last Thursday 11/22/17. He has a history of diabetes mellitus type to which he has had for greater than 30 years currently he is on insulin. Subsequently he also has a history of hypertension. On physical exam inspection is also appears he may have some peripheral vascular disease with his ABI's being noncompressible bilaterally. He is on dialysis as well and this is on Monday, Wednesday, and Friday. Patient was hospitalized back in January/February 2019 although this was more for stomach/back pain though they never told me exactly what was going on. This is according to the patient. Subsequently the ulcer which is between the third and fourth toe when space of the right foot as well is on the plantar aspect of the fourth toe is due to him having cleaned his toes this morning and he states that his finger which is large  split the toe tissue in between causing the wounds that we currently see. With that being said he states this has happened before I explained I would definitely recommend that he not do this any longer. He can use a small soft washcloth to clean between the toes without causing trauma. Subsequently the right great toe hatchery does show evidence of necrotic tissue present on the surface of the wound specifically there is callous and dead skin surrounding which is trapping fluid causing problems as far as the wound is concerned. The surface of the wound does show slough although due to his vascular flow I'm not going to sharply debride this today I think I will selectively debride away the necrotic skin as well is callous from around the surrounding so this will not continue to be a moisture issue. Nonetheless the patient has no pain he does have diabetic neuropathy. Patient History Information obtained from Patient. Allergies no known allergies Family History Cancer - Mother,Father, Diabetes - Mother, Heart Disease - Mother, Hypertension - Mother,Father, Stroke - Father, Thyroid Problems - Mother, No family history of Kidney Disease, Lung Disease, Seizures, Tuberculosis. Social History Former smoker - cigar about 4 to 5 days, Marital Status - Divorced, Alcohol Use - Never, Drug Use - No History, Caffeine Use - Daily. Medical History Eyes Denies history of Cataracts, Glaucoma, Optic Neuritis Ear/Nose/Mouth/Throat Denies history of Chronic sinus problems/congestion, Middle ear problems Hematologic/Lymphatic Patient has history of Lymphedema BRAND, SIEVER ([id number]) Denies history of Anemia, Hemophilia, Human Immunodeficiency Virus, Sickle Cell Disease Respiratory Denies history of Aspiration, Asthma, Chronic Obstructive Pulmonary Disease (COPD), Pneumothorax, Sleep Apnea, Tuberculosis Cardiovascular Patient has history of Arrhythmia, Hypertension Denies history of Congestive  Heart Failure, Coronary Artery Disease, Deep Vein Thrombosis, Hypotension, Myocardial Infarction, Peripheral Arterial Disease, Peripheral Venous Disease, Phlebitis, Vasculitis Gastrointestinal Denies history of Cirrhosis , Colitis, Crohn s, Hepatitis A, Hepatitis B, Hepatitis C Endocrine Patient has history of Type II Diabetes Genitourinary Patient has history of End Stage Renal Disease Immunological Denies history of Lupus Erythematosus, Raynaud s Musculoskeletal Patient has history of Rheumatoid Arthritis, Osteoarthritis Denies history of Gout, Osteomyelitis Neurologic Denies history of Dementia, Neuropathy, Quadriplegia, Paraplegia, Seizure Disorder Oncologic Denies history of Received Chemotherapy, Received Radiation Psychiatric Patient has history of Confinement Anxiety Denies history of Anorexia/bulimia Patient is treated with  Controlled Diet. Blood sugar is not tested. Hospitalization/Surgery History - 08/11/2017, [facility], stomach and back pain. Review of Systems (ROS) Constitutional Symptoms (General Health) Complains or has symptoms of Marked Weight Change - 70 to 80 lbs in past 1 year. Denies complaints or symptoms of Fatigue, Fever, Chills. Eyes Denies complaints or symptoms of Dry Eyes, Vision Changes, Glasses / Contacts. 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 Respiratory Complains or has symptoms of Shortness of Breath - with exertion. Cardiovascular Complains or has symptoms of LE edema. Denies complaints or symptoms of Chest pain. Gastrointestinal Denies complaints or symptoms of Frequent diarrhea, Nausea, Vomiting. Endocrine Denies complaints or symptoms of Hepatitis, Thyroid disease, Polydypsia (Excessive Thirst). 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 Integumentary (Skin) Complains or has symptoms of Wounds, Bleeding or bruising tendency. Denies complaints or symptoms of Breakdown, Swelling. Musculoskeletal Denies complaints or symptoms of Muscle Pain, Muscle Weakness. RANDEN, KAUTH ([id number]) Neurologic Denies complaints or symptoms of Numbness/parasthesias, Focal/Weakness. Oncologic The patient has no complaints or symptoms, kidney cancer right kidney removed Psychiatric Complains or has symptoms of Anxiety, Claustrophobia. Objective Constitutional patient is hypertensive.. pulse regular and within target range for patient.Marland Kitchen respirations regular, non-labored and within target range for patient.Marland Kitchen temperature within target range for patient.. Well-nourished and well-hydrated in no acute distress. Vitals Time Taken: 2:26 PM, Height: 76 in, Source: Stated, Weight: 220 lbs, Source: Stated, BMI: 26.8, Temperature: 98.4 F, Pulse: 87 bpm, Respiratory Rate: 18 breaths/min, Blood Pressure: 188/89 mmHg. Eyes conjunctiva clear no eyelid edema noted. pupils equal round and reactive to light and accommodation. Ears, Nose, Mouth, and Throat no gross abnormality of ear auricles or external auditory canals. normal hearing noted during conversation. mucus membranes moist. Respiratory normal breathing without difficulty. clear to auscultation bilaterally. Cardiovascular regular rate and rhythm with normal S1, S2. Absent posterior tibial and dorsalis pedis pulses bilateral lower extremities. no clubbing, cyanosis, significant edema, Gastrointestinal (GI) soft, non-tender, non-distended, +BS. no ventral hernia noted. Musculoskeletal Patient unable to walk without assistance. Psychiatric this patient is able to make decisions and demonstrates good insight into disease  process. Alert and Oriented x 3. pleasant and cooperative. General Notes: On evaluation today patient's wound shows necrotic slough on the surface of the distal portion of his right great toe there was necrotic skin as well of callous noted surrounding the wound bed. This appears to be trapping fluid and did require sharp debridement today. Patient tolerated this without any pain whatsoever and there was minimal bleeding noted. With that being said patient's blood pressure is elevated at this time which does have me somewhat concerned concerning his diabetes and the fact that he is definitely still a current smoker. I did discuss smoking with him today as well. I explained that even though she "doesn't inhale" and only smoke cigars occasionally I still think it would be best for him to get this up completely. He was somewhat taken aback by this I felt however I believe that it is the most appropriate recommendation at this point. I did spend 3 to 5 minutes discussion about this. Integumentary (Hair, Skin) HY, SWIATEK. ([id number]) Wound #1 status is Open. Original cause of wound was Gradually Appeared. The wound is  located on the Right Toe Great. The wound measures 1.5cm length x 2cm width x 0.1cm depth; 2.356cm^2 area and 0.236cm^3 volume. There is a small amount of serosanguineous drainage noted. The wound margin is flat and intact. There is no granulation within the wound bed. There is a large (67-100%) amount of necrotic tissue within the wound bed including Adherent Slough. The periwound skin appearance exhibited: Maceration. The periwound skin appearance did not exhibit: Callus, Crepitus, Excoriation, Induration, Rash, Scarring, Dry/Scaly, Atrophie Blanche, Cyanosis, Ecchymosis, Hemosiderin Staining, Mottled, Pallor, Rubor, Erythema. Periwound temperature was noted as No Abnormality. Wound #2 status is Open. Original cause of wound was Gradually Appeared. The wound is located on the  Right Toe - Web between 3rd and 4th. The wound measures 0.9cm length x 0.2cm width x 0.2cm depth; 0.141cm^2 area and 0.028cm^3 volume. There is Fat Layer (Subcutaneous Tissue) Exposed exposed. There is no tunneling or undermining noted. There is a medium amount of serosanguineous drainage noted. The wound margin is distinct with the outline attached to the wound base. There is large (67-100%) red granulation within the wound bed. There is a small (1-33%) amount of necrotic tissue within the wound bed. The periwound skin appearance did not exhibit: Callus, Crepitus, Excoriation, Induration, Rash, Scarring, Dry/Scaly, Maceration, Atrophie Blanche, Cyanosis, Ecchymosis, Hemosiderin Staining, Mottled, Pallor, Rubor, Erythema. Periwound temperature was noted as No Abnormality. Assessment Active Problems ICD-10 E11.621 - Type 2 diabetes mellitus with foot ulcer L97.512 - Non-pressure chronic ulcer of other part of right foot with fat layer exposed I10 - Essential (primary) hypertension E44.1 - Mild protein-calorie malnutrition Procedures Wound #1 Pre-procedure diagnosis of Wound #1 is a Diabetic Wound/Ulcer of the Lower Extremity located on the Right Toe Great .Severity of Tissue Pre Debridement is: Limited to breakdown of skin. There was a Selective/Open Wound Skin/Dermis Debridement with a total area of 3 sq cm performed by [name], [name] E., PA-C. With the following instrument(s): Curette to remove Non-Viable tissue/material. Material removed includes Skin: Dermis after achieving pain control using Other (lidocaine 4%). No specimens were taken. A time out was conducted at 15:27, prior to the start of the procedure. There was no bleeding. The procedure was tolerated well with a pain level of 0 throughout and a pain level of 0 following the procedure. Patient s Level of Consciousness post procedure was recorded as Awake and Alert and post-procedure vitals were taken including Temperature: 98.4 F,  Pulse: 87 bpm, Respiratory Rate: 18 breaths/min, Blood Pressure: (188)/(89) mmHg. Post Debridement Measurements: 1.5cm length x 2cm width x 0.1cm depth; 0.236cm^3 volume. Character of Wound/Ulcer Post Debridement is stable. Severity of Tissue Post Debridement is: Limited to breakdown of skin. Post procedure Diagnosis Wound #1: Same as Pre-Procedure Plan Wound Cleansing: TAYTE, MCWHERTER ([id number]) Wound #1 Right Toe Great: Cleanse wound with mild soap and water Wound #2 Right Toe - Web between 3rd and 4th: Cleanse wound with mild soap and water Anesthetic (add to Medication List): Wound #1 Right Toe Great: Topical Lidocaine 4% cream applied to wound bed prior to debridement (In Clinic Only). Wound #2 Right Toe - Web between 3rd and 4th: Topical Lidocaine 4% cream applied to wound bed prior to debridement (In Clinic Only). Primary Wound Dressing: Wound #1 Right Toe Great: Santyl Ointment Wound #2 Right Toe - Web between 3rd and 4th: Silver Alginate Secondary Dressing: Wound #1 Right Toe Great: Gauze and Kerlix/Conform Wound #2 Right Toe - Web between 3rd and 4th: Gauze and Kerlix/Conform Dressing Change Frequency: Wound #1 Right  Toe Great: Three times weekly Wound #2 Right Toe - Web between 3rd and 4th: Three times weekly Follow-up Appointments: Wound #1 Right Toe Great: Return Appointment in 1 week. Wound #2 Right Toe - Web between 3rd and 4th: Return Appointment in 1 week. Edema Control: Wound #1 Right Toe Great: Elevate legs to the level of the heart and pump ankles as often as possible Wound #2 Right Toe - Web between 3rd and 4th: Elevate legs to the level of the heart and pump ankles as often as possible Off-Loading: Wound #1 Right Toe Great: Open toe surgical shoe to: Wound #2 Right Toe - Web between 3rd and 4th: Open toe surgical shoe to: Radiology ordered were: X-ray, foot - Concentration on Great toe and in between 3-4th toe Services and Therapies ordered  were: Arterial Studies- Bilateral - AVVS The following medication(s) was prescribed: Santyl topical 250 unit/gram ointment ointment topical applied nickel thick to the right distal great toe daily and cover with dressing as directed starting 11/27/2017 General Notes: Request Labs from [facility] Currently I'm gonna recommend several things for the patient. First and foremost I would like to get an x-ray of the right foot to evaluate for any evidence of osteomyelitis or otherwise. Secondly I am going to also go ahead and order vascular studies, that is arterial studies, bilateral fours lower extremities in order to evaluate and see if we're safe to debride or not going forward. I did also go ahead and send in a prescription for Santyl which I had discussed with the patient as well. Subsequently what I also want to obtain the labs from [facility] which hopefully will include protein and albumin to check and see how his nutritional status appears at this point. If they have not performed labs we may need to request them to perform this in particular. Otherwise at this time I'm gonna suggest the Santyl be used for the great toe and so rows in it for the area between RILLEY, STASH. ([id number]) the webspace of the third and fourth toe. Patient is in agreement with this plan. We also discussed protein shakes be in a good way to help with increasing and bolstering protein in general. Unfortunately patient has not had a good appetite recently and has lost 80 pounds due to just not wanting to eat over the past year according to him and the caregiver who was with him today. We will see him for reevaluation in one weeks time. Please see above for specific wound care orders. We will see patient for re-evaluation in 1 week(s) here in the clinic. If anything worsens or changes patient will contact our office for additional recommendations. Electronic Signature(s) Signed: 11/27/2017 6:53:41 PM By: Worthy Keeler  PA-C Entered By: Worthy Keeler on 11/27/2017 15:57:28 BASTIEN, STRAWSER ([id number]) -------------------------------------------------------------------------------- ROS/PFSH Details Patient Name: Timothy Gibson Date of Service: 11/27/2017 2:15 PM Medical Record Number: [id number] Patient Account Number: [id number] Date of Birth/Sex: 1951-03-14 (67 y.o. Male) Treating RN: Roger Shelter Primary Care Provider: PATIENT, NO Other Clinician: Referring Provider: Konrad Felix Treating Provider/Extender: [name], [name] Weeks in Treatment: 0 Information Obtained From Patient Wound History Constitutional Symptoms (General Health) Complaints and Symptoms: Positive for: Marked Weight Change - 70 to 80 lbs in past 1 year Negative for: Fatigue; Fever; Chills Eyes Complaints and Symptoms: Negative for: Dry Eyes; Vision Changes; Glasses / Contacts Medical History: Negative for: Cataracts; Glaucoma; Optic Neuritis Ear/Nose/Mouth/Throat Complaints and Symptoms: Negative for: Difficult clearing ears; Sinusitis Review of System  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 Medical History: Negative for: Chronic sinus problems/congestion; Middle ear problems Hematologic/Lymphatic Complaints and Symptoms: Negative for: Bleeding / Clotting Disorders; Human Immunodeficiency Virus Medical History: Positive for: Lymphedema Negative for: Anemia; Hemophilia; Human Immunodeficiency Virus; Sickle Cell Disease Respiratory Complaints and Symptoms: Positive for: Shortness of Breath - with exertion Medical History: Negative for: Aspiration; Asthma; Chronic Obstructive Pulmonary Disease (COPD); Pneumothorax; Sleep Apnea; Tuberculosis Cardiovascular Complaints and Symptoms: Positive for: LE edema Negative for: Chest pain LOREN, SAWAYA ([id number]) Medical History: Positive for: Arrhythmia; Hypertension Negative for: Congestive Heart Failure; Coronary Artery Disease; Deep Vein Thrombosis; Hypotension;  Myocardial Infarction; Peripheral Arterial Disease; Peripheral Venous Disease; Phlebitis; Vasculitis Gastrointestinal Complaints and Symptoms: Negative for: Frequent diarrhea; Nausea; Vomiting Medical History: Negative for: Cirrhosis ; Colitis; Crohnos; Hepatitis A; Hepatitis B; Hepatitis C Endocrine Complaints and Symptoms: Negative for: Hepatitis; Thyroid disease; Polydypsia (Excessive Thirst) Medical History: Positive for: Type II Diabetes Time with diabetes: 30 years Treated with: Diet Blood sugar tested every day: No Genitourinary Complaints and Symptoms: Positive for: Kidney failure/ Dialysis - m- w- f- dialysis x 10 years Review of System Notes: 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 Medical History: Positive for: End Stage Renal Disease Immunological Complaints and Symptoms: Negative for: Hives; Itching Medical History: Negative for: Lupus Erythematosus; Raynaudos Integumentary (Skin) Complaints and Symptoms: Positive for: Wounds; Bleeding or bruising tendency Negative for: Breakdown; Swelling Musculoskeletal Complaints and Symptoms: Negative for: Muscle Pain; Muscle Weakness Medical History: Positive for: Rheumatoid Arthritis; Osteoarthritis Negative for: Gout; Osteomyelitis NORM, WRAY ([id number]) Neurologic Complaints and Symptoms: Negative for: Numbness/parasthesias; Focal/Weakness Medical History: Negative for: Dementia; Neuropathy; Quadriplegia; Paraplegia; Seizure Disorder Psychiatric Complaints and Symptoms: Positive for: Anxiety; Claustrophobia Medical History: Positive for: Confinement Anxiety Negative for: Anorexia/bulimia Oncologic Complaints and Symptoms: No Complaints or Symptoms Complaints and Symptoms: Review of System Notes: kidney cancer right kidney removed Medical History: Negative for: Received Chemotherapy; Received Radiation Immunizations Pneumococcal Vaccine: Received Pneumococcal  Vaccination: Yes Implantable Devices Hospitalization / Surgery History Name of Hospital Purpose of Hospitalization/Surgery Date [facility] stomach and back pain 08/11/2017 Family and Social History Cancer: Yes - Mother,Father; Diabetes: Yes - Mother; Heart Disease: Yes - Mother; Hypertension: Yes - Mother,Father; Kidney Disease: No; Lung Disease: No; Seizures: No; Stroke: Yes - Father; Thyroid Problems: Yes - Mother; Tuberculosis: No; Former smoker - cigar about 4 to 5 days; Marital Status - Divorced; Alcohol Use: Never; Drug Use: No History; Caffeine Use: Daily; Financial Concerns: No; Food, Clothing or Shelter Needs: No; Support System Lacking: No; Transportation Concerns: No; Advanced Directives: Yes (Not Provided); Do not resuscitate: No; Living Will: Yes (Not Provided); Medical Power of Attorney: Yes (Not Provided) Electronic Signature(s) Signed: 11/27/2017 6:53:41 PM By: Worthy Keeler PA-C Signed: 11/28/2017 7:52:46 AM By: Roger Shelter Entered By: Roger Shelter on 11/27/2017 14:42:00 Timothy Gibson ([id number]) -------------------------------------------------------------------------------- SuperBill Details Patient Name: Timothy Gibson Date of Service: 11/27/2017 Medical Record Number: [id number] Patient Account Number: [id number] Date of Birth/Sex: 12/03/1950 (66 y.o. Male) Treating RN: Cornell Barman Primary Care Provider: PATIENT, NO Other Clinician: Referring Provider: Konrad Felix Treating Provider/Extender: Melburn Hake, [name] Weeks in Treatment: 0 Diagnosis Coding ICD-10 Codes Code Description E11.621 Type 2 diabetes mellitus with foot ulcer L97.512 Non-pressure chronic ulcer of other part of right foot with fat layer exposed I10 Essential (primary) hypertension E44.1 Mild protein-calorie malnutrition Facility Procedures CPT4 Code: 29937169 Description: 67893 - WOUND CARE VISIT-LEV 4 EST PT Modifier: Quantity: 1 CPT4 Code: 81017510 Description: 25852 - DEBRIDE  WOUND 1ST 20 SQ CM OR < ICD-10 Diagnosis Description L97.512 Non-pressure chronic ulcer of other part of right foot with fat Modifier: layer exposed Quantity: 1 CPT4 Code: 26415830 Description: 99406-SMOKING CESSATION 3-10MINS ICD-10 Diagnosis Description L97.512 Non-pressure chronic ulcer of other part of right foot with fat E11.621 Type 2 diabetes mellitus with foot ulcer I10 Essential (primary) hypertension E44.1 Mild  protein-calorie malnutrition Modifier: layer exposed Quantity: 1 Physician Procedures CPT4 Code: 9407680 Description: 88110 - WC PHYS LEVEL 4 - NEW PT ICD-10 Diagnosis Description E11.621 Type 2 diabetes mellitus with foot ulcer L97.512 Non-pressure chronic ulcer of other part of right foot with fat I10 Essential (primary) hypertension E44.1 Mild  protein-calorie malnutrition Modifier: 25 layer exposed Quantity: 1 CPT4 Code: 3159458 Description: 59292 - WC PHYS DEBR WO ANESTH 20 SQ CM ICD-10 Diagnosis Description L97.512 Non-pressure chronic ulcer of other part of right foot with fat Modifier: layer exposed Quantity: 1 CPT4 Code: 44628 Nathanson, JAM Description: [place]- SMOKING CESSATION 3-10 MINS ICD-10 Diagnosis Description L97.512 Non-pressure chronic ulcer of other part of right foot with fat E11.621 Type 2 diabetes mellitus with foot ulcer ES W. ([id number]) Modifier: layer exposed Quantity: 1 Electronic Signature(s) Signed: 11/27/2017 6:53:41 PM By: Worthy Keeler PA-C Entered By: Worthy Keeler on 11/27/2017 15:58:12

## 2017-11-29 NOTE — Progress Notes (Signed)
Timothy Gibson, Timothy Gibson ([id number]) Visit Report for 11/27/2017 Abuse/Suicide Risk Screen Details Patient Name: Timothy Gibson, Timothy Gibson. Date of Service: 11/27/2017 2:15 PM Medical Record Number: [id number] Patient Account Number: [id number] Date of Birth/Sex: 1951-01-11 (67 y.o. Male) Treating RN: Roger Shelter Primary Care Izola Teague: PATIENT, NO Other Clinician: Referring Ryhanna Dunsmore: Konrad Felix Treating Keilyn Haggard/Extender: [name], [name] Weeks in Treatment: 0 Abuse/Suicide Risk Screen Items Answer ABUSE/SUICIDE RISK SCREEN: Has anyone close to you tried to hurt or harm you recentlyo No Do you feel uncomfortable with anyone in your familyo No Has anyone forced you do things that you didnot want to doo No Do you have any thoughts of harming yourselfo No Patient displays signs or symptoms of abuse and/or neglect. No Electronic Signature(s) Signed: 11/28/2017 7:52:46 AM By: Roger Shelter Entered By: Roger Shelter on 11/27/2017 14:42:26 Timothy Gibson ([id number]) -------------------------------------------------------------------------------- Activities of Daily Living Details Patient Name: Timothy Gibson Date of Service: 11/27/2017 2:15 PM Medical Record Number: [id number] Patient Account Number: [id number] Date of Birth/Sex: [birth year] (67 y.o. Male) Treating RN: Roger Shelter Primary Care Jacobie Stamey: PATIENT, NO Other Clinician: Referring Marlin Brys: Konrad Felix Treating Isak Sotomayor/Extender: [name], [name] Weeks in Treatment: 0 Activities of Daily Living Items Answer Activities of Daily Living (Please select one for each item) Drive Automobile Completely Able Take Medications Completely Able Use Telephone Completely Able Care for Appearance Completely Able Use Toilet Completely Able Bath / Shower Completely Able Dress Self Completely Able Feed Self Completely Able Walk Completely Able Get In / Out Bed Completely Able Housework Completely Able Prepare Meals  Completely [place] for Self Completely Able Electronic Signature(s) Signed: 11/28/2017 7:52:46 AM By: Roger Shelter Entered By: Roger Shelter on 11/27/2017 14:42:58 Timothy Gibson ([id number]) -------------------------------------------------------------------------------- Education Assessment Details Patient Name: Timothy Gibson Date of Service: 11/27/2017 2:15 PM Medical Record Number: [id number] Patient Account Number: [id number] Date of Birth/Sex: 07-06-1951 (67 y.o. Male) Treating RN: Roger Shelter Primary Care Kahleel Fadeley: PATIENT, NO Other Clinician: Referring Shirline Kendle: Konrad Felix Treating Noreene Boreman/Extender: Melburn Hake, [name] Weeks in Treatment: 0 Primary Learner Assessed: Patient Learning Preferences/Education Level/Primary Language Learning Preference: Explanation Highest Education Level: College or Above Preferred Language: English Cognitive Barrier Assessment/Beliefs Language Barrier: No Translator Needed: No Memory Deficit: No Emotional Barrier: No Cultural/Religious Beliefs Affecting Medical Care: No Physical Barrier Assessment Impaired Vision: Yes Glasses Impaired Hearing: No Decreased Hand dexterity: No Knowledge/Comprehension Assessment Knowledge Level: High Comprehension Level: High Ability to understand written High instructions: Ability to understand verbal High instructions: Motivation Assessment Anxiety Level: Calm Cooperation: Cooperative Education Importance: Acknowledges Need Interest in Health Problems: Asks Questions Perception: Coherent Willingness to Engage in Self- High Management Activities: Readiness to Engage in Self- High Management Activities: Electronic Signature(s) Signed: 11/28/2017 7:52:46 AM By: Roger Shelter Entered By: Roger Shelter on 11/27/2017 14:43:41 Timothy Gibson ([id number]) -------------------------------------------------------------------------------- Fall  Risk Assessment Details Patient Name: Timothy Gibson Date of Service: 11/27/2017 2:15 PM Medical Record Number: [id number] Patient Account Number: [id number] Date of Birth/Sex: Oct 29, 1950 (67 y.o. Male) Treating RN: Roger Shelter Primary Care Alwilda Gilland: PATIENT, NO Other Clinician: Referring Markitta Ausburn: Konrad Felix Treating Perris Tripathi/Extender: Melburn Hake, [name] Weeks in Treatment: 0 Fall Risk Assessment Items Have you had 2 or more falls in the last 12 monthso 0 Yes Have you had any fall that resulted in injury in the last 12 monthso 0 No FALL RISK ASSESSMENT: History of falling - immediate or within 3 months 0 No Secondary diagnosis 0 No Ambulatory aid None/bed rest/wheelchair/nurse 0 No Crutches/cane/walker 0 No Furniture  0 No IV Access/Saline Lock 0 No Gait/Training Normal/bed rest/immobile 0 No Weak 0 No Impaired 0 No Mental Status Oriented to own ability 0 No Electronic Signature(s) Signed: 11/28/2017 7:52:46 AM By: Roger Shelter Entered By: Roger Shelter on 11/27/2017 14:43:56 Timothy Gibson ([id number]) -------------------------------------------------------------------------------- Foot Assessment Details Patient Name: Timothy Gibson Date of Service: 11/27/2017 2:15 PM Medical Record Number: [id number] Patient Account Number: [id number] Date of Birth/Sex: [birth year] (67 y.o. Male) Treating RN: Roger Shelter Primary Care Fey Coghill: PATIENT, NO Other Clinician: Referring Marvis Bakken: Konrad Felix Treating Laqueena Hinchey/Extender: [name], [name] Weeks in Treatment: 0 Foot Assessment Items Site Locations + = Sensation present, - = Sensation absent, C = Callus, U = Ulcer R = Redness, W = Warmth, M = Maceration, PU = Pre-ulcerative lesion F = Fissure, S = Swelling, D = Dryness Assessment Right: Left: Other Deformity: No No Prior Foot Ulcer: No No Prior Amputation: No No Charcot Joint: No No Ambulatory Status: Ambulatory Without Help Gait:  Steady Electronic Signature(s) Signed: 11/28/2017 7:52:46 AM By: Roger Shelter Entered By: Roger Shelter on 11/27/2017 14:48:31 Timothy Gibson ([id number]) -------------------------------------------------------------------------------- Nutrition Risk Assessment Details Patient Name: Timothy Gibson Date of Service: 11/27/2017 2:15 PM Medical Record Number: [id number] Patient Account Number: [id number] Date of Birth/Sex: 11/05/1950 (67 y.o. Male) Treating RN: Roger Shelter Primary Care Temperance Kelemen: PATIENT, NO Other Clinician: Referring Victoria Euceda: Konrad Felix Treating Penny Frisbie/Extender: [name], [name] Weeks in Treatment: 0 Height (in): 76 Weight (lbs): 220 Body Mass Index (BMI): 26.8 Nutrition Risk Assessment Items NUTRITION RISK SCREEN: I have an illness or condition that made me change the kind and/or amount of 0 No food I eat I eat fewer than two meals per day 0 No I eat few fruits and vegetables, or milk products 0 No I have three or more drinks of beer, liquor or wine almost every day 0 No I have tooth or mouth problems that make it hard for me to eat 0 No I don't always have enough money to buy the food I need 0 No I eat alone most of the time 0 No I take three or more different prescribed or over-the-counter drugs a day 0 No Without wanting to, I have lost or gained 10 pounds in the last six months 0 No I am not always physically able to shop, cook and/or feed myself 0 No Nutrition Protocols Good Risk Protocol 0 No interventions needed Moderate Risk Protocol Electronic Signature(s) Signed: 11/28/2017 7:52:46 AM By: Roger Shelter Entered By: Roger Shelter on 11/27/2017 14:44:38

## 2017-11-30 ENCOUNTER — Other Ambulatory Visit (INDEPENDENT_AMBULATORY_CARE_PROVIDER_SITE_OTHER): Payer: Medicare Other

## 2017-11-30 ENCOUNTER — Other Ambulatory Visit: Payer: Self-pay | Admitting: Physician Assistant

## 2017-11-30 DIAGNOSIS — Z992 Dependence on renal dialysis: Secondary | ICD-10-CM | POA: Diagnosis not present

## 2017-11-30 DIAGNOSIS — D509 Iron deficiency anemia, unspecified: Secondary | ICD-10-CM | POA: Diagnosis not present

## 2017-11-30 DIAGNOSIS — S81809A Unspecified open wound, unspecified lower leg, initial encounter: Secondary | ICD-10-CM

## 2017-11-30 DIAGNOSIS — N2581 Secondary hyperparathyroidism of renal origin: Secondary | ICD-10-CM | POA: Diagnosis not present

## 2017-11-30 DIAGNOSIS — D631 Anemia in chronic kidney disease: Secondary | ICD-10-CM | POA: Diagnosis not present

## 2017-11-30 DIAGNOSIS — N186 End stage renal disease: Secondary | ICD-10-CM | POA: Diagnosis not present

## 2017-12-03 DIAGNOSIS — Z992 Dependence on renal dialysis: Secondary | ICD-10-CM | POA: Diagnosis not present

## 2017-12-03 DIAGNOSIS — N2581 Secondary hyperparathyroidism of renal origin: Secondary | ICD-10-CM | POA: Diagnosis not present

## 2017-12-03 DIAGNOSIS — D631 Anemia in chronic kidney disease: Secondary | ICD-10-CM | POA: Diagnosis not present

## 2017-12-03 DIAGNOSIS — D509 Iron deficiency anemia, unspecified: Secondary | ICD-10-CM | POA: Diagnosis not present

## 2017-12-03 DIAGNOSIS — N186 End stage renal disease: Secondary | ICD-10-CM | POA: Diagnosis not present

## 2017-12-05 DIAGNOSIS — D631 Anemia in chronic kidney disease: Secondary | ICD-10-CM | POA: Diagnosis not present

## 2017-12-05 DIAGNOSIS — N2581 Secondary hyperparathyroidism of renal origin: Secondary | ICD-10-CM | POA: Diagnosis not present

## 2017-12-05 DIAGNOSIS — D509 Iron deficiency anemia, unspecified: Secondary | ICD-10-CM | POA: Diagnosis not present

## 2017-12-05 DIAGNOSIS — N186 End stage renal disease: Secondary | ICD-10-CM | POA: Diagnosis not present

## 2017-12-05 DIAGNOSIS — Z992 Dependence on renal dialysis: Secondary | ICD-10-CM | POA: Diagnosis not present

## 2017-12-06 ENCOUNTER — Encounter: Payer: Medicare Other | Admitting: Nurse Practitioner

## 2017-12-06 ENCOUNTER — Other Ambulatory Visit
Admission: RE | Admit: 2017-12-06 | Discharge: 2017-12-06 | Disposition: A | Discharge status: Home / Self Care | Payer: Medicare Other | Source: Ambulatory Visit | Attending: Nurse Practitioner | Admitting: Nurse Practitioner

## 2017-12-06 DIAGNOSIS — E1122 Type 2 diabetes mellitus with diabetic chronic kidney disease: Secondary | ICD-10-CM | POA: Diagnosis not present

## 2017-12-06 DIAGNOSIS — L97512 Non-pressure chronic ulcer of other part of right foot with fat layer exposed: Secondary | ICD-10-CM | POA: Diagnosis not present

## 2017-12-06 DIAGNOSIS — N186 End stage renal disease: Secondary | ICD-10-CM | POA: Diagnosis not present

## 2017-12-06 DIAGNOSIS — E1151 Type 2 diabetes mellitus with diabetic peripheral angiopathy without gangrene: Secondary | ICD-10-CM | POA: Diagnosis not present

## 2017-12-06 DIAGNOSIS — E11621 Type 2 diabetes mellitus with foot ulcer: Secondary | ICD-10-CM | POA: Diagnosis not present

## 2017-12-06 DIAGNOSIS — L0889 Other specified local infections of the skin and subcutaneous tissue: Secondary | ICD-10-CM | POA: Diagnosis not present

## 2017-12-06 DIAGNOSIS — I120 Hypertensive chronic kidney disease with stage 5 chronic kidney disease or end stage renal disease: Secondary | ICD-10-CM | POA: Diagnosis not present

## 2017-12-07 DIAGNOSIS — N186 End stage renal disease: Secondary | ICD-10-CM | POA: Diagnosis not present

## 2017-12-07 DIAGNOSIS — Z992 Dependence on renal dialysis: Secondary | ICD-10-CM | POA: Diagnosis not present

## 2017-12-07 DIAGNOSIS — D631 Anemia in chronic kidney disease: Secondary | ICD-10-CM | POA: Diagnosis not present

## 2017-12-07 DIAGNOSIS — D509 Iron deficiency anemia, unspecified: Secondary | ICD-10-CM | POA: Diagnosis not present

## 2017-12-07 DIAGNOSIS — N2581 Secondary hyperparathyroidism of renal origin: Secondary | ICD-10-CM | POA: Diagnosis not present

## 2017-12-07 NOTE — Progress Notes (Addendum)
DEVLON, DOSHER ([id number]) Visit Report for 12/06/2017 Chief Complaint Document Details Patient Name: Timothy Gibson, Timothy Gibson Date of Service: 12/06/2017 8:30 AM Medical Record Number: [id number] Patient Account Number: [id number] Date of Birth/Sex: [birth year] (67 y.o. M) Treating RN: Ahmed Prima Primary Care Provider: PATIENT, NO Other Clinician: Referring Provider: Konrad Felix Treating Provider/Extender: Cathie Olden in Treatment: 1 Information Obtained from: Patient Chief Complaint Right Great Toe and Right 3/4th Toe webspace ulcers Electronic Signature(s) Signed: 12/06/2017 9:18:11 AM By: Lawanda Cousins Entered By: Lawanda Cousins on 12/06/2017 09:18:10 Timothy Gibson ([id number]) -------------------------------------------------------------------------------- Debridement Details Patient Name: Timothy Gibson Date of Service: 12/06/2017 8:30 AM Medical Record Number: [id number] Patient Account Number: [id number] Date of Birth/Sex: 08/23/1950 (67 y.o. M) Treating RN: Ahmed Prima Primary Care Provider: PATIENT, NO Other Clinician: Referring Provider: Konrad Felix Treating Provider/Extender: Cathie Olden in Treatment: 1 Debridement Performed for Wound #1 Right Toe Great Assessment: Performed By: Physician Lawanda Cousins, NP Debridement Type: Debridement Severity of Tissue Pre Fat layer exposed Debridement: Pre-procedure Verification/Time Yes - 09:03 Out Taken: Start Time: 09:03 Pain Control: Lidocaine 4% Topical Solution Total Area Debrided (L x W): 1.2 (cm) x 1.3 (cm) = 1.56 (cm) Tissue and other material Non-Viable, Slough, Fibrin/Exudate, Slough debrided: Level: Non-Viable Tissue Debridement Description: Selective/Open Wound Instrument: Curette Specimen: Swab, Number of Specimens Taken: 1 Bleeding: Minimum Hemostasis Achieved: Pressure End Time: 09:06 Procedural Pain: 0 Post Procedural Pain: 0 Response to Treatment: Procedure was  tolerated well Level of Consciousness: Awake and Alert Post Procedure Vitals: Temperature: 98.5 Pulse: 105 Respiratory Rate: 16 Blood Pressure: Systolic Blood Pressure: 277 Diastolic Blood Pressure: 85 Post Debridement Measurements of Total Wound Length: (cm) 1.2 Width: (cm) 1.3 Depth: (cm) 0.2 Volume: (cm) 0.245 Character of Wound/Ulcer Post Debridement: Requires Further Debridement Severity of Tissue Post Debridement: Fat layer exposed Post Procedure Diagnosis Same as Pre-procedure Electronic Signature(s) JSON, KOELZER ([id number]) Signed: 12/06/2017 9:17:58 AM By: Lawanda Cousins Signed: 12/07/2017 5:38:20 PM By: Alric Quan Entered By: Lawanda Cousins on 12/06/2017 09:17:58 Timothy Gibson ([id number]) -------------------------------------------------------------------------------- HPI Details Patient Name: Timothy Gibson Date of Service: 12/06/2017 8:30 AM Medical Record Number: [id number] Patient Account Number: [id number] Date of Birth/Sex: 1950/08/03 (67 y.o. M) Treating RN: Ahmed Prima Primary Care Provider: PATIENT, NO Other Clinician: Referring Provider: Konrad Felix Treating Provider/Extender: Cathie Olden in Treatment: 1 History of Present Illness HPI Description: 11/27/17 on evaluation today patient presents for initial evaluation concerning an issue that he has been having with his right great toe which began last Thursday 11/22/17. He has a history of diabetes mellitus type to which he has had for greater than 30 years currently he is on insulin. Subsequently he also has a history of hypertension. On physical exam inspection is also appears he may have some peripheral vascular disease with his ABI's being noncompressible bilaterally. He is on dialysis as well and this is on Monday, Wednesday, and Friday. Patient was hospitalized back in January/February 2019 although this was more for stomach/back pain though they never told me exactly what  was going on. This is according to the patient. Subsequently the ulcer which is between the third and fourth toe when space of the right foot as well is on the plantar aspect of the fourth toe is due to him having cleaned his toes this morning and he states that his finger which is large split the toe tissue in between causing the wounds that we currently see. With that being said he states this has happened before  I explained I would definitely recommend that he not do this any longer. He can use a small soft washcloth to clean between the toes without causing trauma. Subsequently the right great toe hatchery does show evidence of necrotic tissue present on the surface of the wound specifically there is callous and dead skin surrounding which is trapping fluid causing problems as far as the wound is concerned. The surface of the wound does show slough although due to his vascular flow I'm not going to sharply debride this today I think I will selectively debride away the necrotic skin as well is callous from around the surrounding so this will not continue to be a moisture issue. Nonetheless the patient has no pain he does have diabetic neuropathy. 12/06/17-He is here in follow-up evaluation for a right great toe ulcer. He is voicing no complaints or concerns. He continues to infrequently/socially smokes cigars with no desire to quit. He has been compliant with offloading, using open toed surgical shoe; has been compliant using Santyl daily. He continues on clindamycin although takes it inconsistently secondary to indigestion. The plain film x-ray performed on 5/23 impression: Soft tissue swelling of the left first toe and is noted with probable irregular lucency involving distal tuft of first distal phalanx concerning for osteomyelitis. MRI may be performed for further evaluation; MRI ordered. The vascular evaluation performed on 5/24 field non-compressible ABIs bilaterally and reduced TBI  bilaterally suggesting significant tibial disease. He will be referred to vascular medicine for further evaluation. Electronic Signature(s) Signed: 12/06/2017 9:16:12 AM By: Lawanda Cousins Previous Signature: 12/06/2017 8:51:03 AM Version By: Lawanda Cousins Entered By: Lawanda Cousins on 12/06/2017 09:16:12 Timothy Gibson ([id number]) -------------------------------------------------------------------------------- Physician Orders Details Patient Name: Timothy Gibson Date of Service: 12/06/2017 8:30 AM Medical Record Number: [id number] Patient Account Number: [id number] Date of Birth/Sex: 1950/11/16 (67 y.o. M) Treating RN: Ahmed Prima Primary Care Provider: PATIENT, NO Other Clinician: Referring Provider: Konrad Felix Treating Provider/Extender: Cathie Olden in Treatment: 1 Verbal / Phone Orders: Yes Clinician: Carolyne Fiscal, Debi Read Back and Verified: Yes Diagnosis Coding Wound Cleansing Wound #1 Right Toe Great o Cleanse wound with mild soap and water Wound #3 Medial Toe Great o Cleanse wound with mild soap and water Anesthetic (add to Medication List) Wound #1 Right Toe Great o Topical Lidocaine 4% cream applied to wound bed prior to debridement (In Clinic Only). Wound #3 Medial Toe Great o Topical Lidocaine 4% cream applied to wound bed prior to debridement (In Clinic Only). Primary Wound Dressing Wound #1 Right Toe Great o Saline moistened gauze - only dampened o Santyl Ointment Wound #3 Medial Toe Great o Saline moistened gauze - only dampened o Santyl Ointment Secondary Dressing Wound #1 Right Toe Great o Gauze and Kerlix/Conform Wound #3 Medial Toe Great o Gauze and Kerlix/Conform Dressing Change Frequency Wound #1 Right Toe Great o Change dressing every day. Follow-up Appointments Wound #1 Right Toe Great o Return Appointment in 1 week. Wound #3 Medial Toe Great o Return Appointment in 1 week. Edema Control KASHDEN, DEBOY ([id number]) Wound #1 Right Toe Great o Elevate legs to the level of the heart and pump ankles as often as possible Wound #3 Medial Toe Great o Elevate legs to the level of the heart and pump ankles as often as possible Off-Loading Wound #1 Right Toe Great o Open toe surgical shoe to: Wound #3 Medial Toe Great o Open toe surgical shoe to: Additional Orders / Instructions Wound #1 Right Toe Great o  Increase protein intake. Wound #3 Medial Toe Great o Increase protein intake. Consults o Vascular Laboratory o Bacteria identified in Wound by Culture (MICRO) oooo LOINC Code: 6144-3 XVQM Convenience Name: Wound culture routine Radiology o Magnetic Resonance Imaging (MRI) - right foot Patient Medications Allergies: no known allergies Notifications Medication Indication Start End lidocaine DOSE 1 - topical 4 % cream - 1 cream topical Electronic Signature(s) Signed: 12/07/2017 6:54:27 AM By: Lawanda Cousins Signed: 12/07/2017 5:38:20 PM By: Alric Quan Entered By: Alric Quan on 12/06/2017 09:13:52 MITCHELL, IWANICKI ([id number]) -------------------------------------------------------------------------------- Prescription 12/06/2017 Patient Name: Timothy Gibson Provider: Lawanda Cousins NP Date of Birth: 22-Jan-1951 NPI#: [id number] Sex: M DEA#: [id number] Phone #: [phone] License #: Patient Address: [facility] [place], [place] [address] [address] [place], [place] [address] [phone] Allergies no known allergies Medication Medication: Route: Strength: Form: lidocaine topical 4% cream Class: TOPICAL LOCAL ANESTHETICS Dose: Frequency / Time: Indication: 1 1 cream topical Number of Refills: Number of Units: 0 Generic Substitution: Start Date: End Date: Administered at Substitution Permitted Facility: Yes Time Administered: Time  Discontinued: Note to Pharmacy: Signature(s): Date(s): Electronic Signature(s) Signed: 12/07/2017 6:54:27 AM By: Lawanda Cousins Signed: 12/07/2017 5:38:20 PM By: Alric Quan Entered By: Alric Quan on 12/06/2017 09:13:52 COTEY, RAKES ([id number]) HAJIME, ASFAW ([id number]) --------------------------------------------------------------------------------  Problem List Details Patient Name: Timothy Gibson Date of Service: 12/06/2017 8:30 AM Medical Record Number: [id number] Patient Account Number: [id number] Date of Birth/Sex: [birth year] (67 y.o. 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 M) Treating RN: Ahmed Prima Primary Care Provider: PATIENT, NO Other Clinician: Referring Provider: Konrad Felix Treating Provider/Extender: Cathie Olden in Treatment:  1 Subjective Chief Complaint Information obtained from Patient Right Great Toe and Right 3/4th Toe webspace ulcers History of Present Illness (HPI) 11/27/17 on evaluation today patient presents for initial evaluation concerning an issue that he has been having with his right great toe which began last Thursday 11/22/17. He has a history of diabetes mellitus type to which he has had for greater than 30 years currently he is on insulin. Subsequently he also has a history of hypertension. On physical exam inspection is also appears he may have some peripheral vascular disease with his ABI's being noncompressible bilaterally. He is on dialysis as well and this is on Monday, Wednesday, and Friday. Patient was hospitalized back in January/February 2019 although this was more for stomach/back pain though they never told me exactly what was going on. This is according to the patient. Subsequently the ulcer which is between the third and fourth toe when space of the right foot as well  is on the plantar aspect of the fourth toe is due to him having cleaned his toes this morning and he states that his finger which is large split the toe tissue in between causing the wounds that we currently see. With that being said he states this has happened before I explained I would definitely recommend that he not do this any longer. He can use a small soft washcloth to clean between the toes without causing trauma. Subsequently the right great toe hatchery does show evidence of necrotic tissue present on the surface of the wound specifically there is callous and dead skin surrounding which is trapping fluid causing problems as far as the wound is concerned. The surface of the wound does show slough although due to his vascular flow I'm not going to sharply debride this today I think I will selectively debride away the necrotic skin as well is callous from around the surrounding so this will not continue to be a moisture  issue. Nonetheless the patient has no pain he does have diabetic neuropathy. 12/06/17-He is here in follow-up evaluation for a right great toe ulcer. He is voicing no complaints or concerns. He continues to infrequently/socially smokes cigars with no desire to quit. He has been compliant with offloading, using open toed surgical shoe; has been compliant using Santyl daily. He continues on clindamycin although takes it inconsistently secondary to indigestion. The plain film x-ray performed on 5/23 impression: Soft tissue swelling of the left first toe and is noted with probable irregular lucency involving distal tuft of first distal phalanx concerning for osteomyelitis. MRI may be performed for further evaluation; MRI ordered. The vascular evaluation performed on 5/24 field non-compressible ABIs bilaterally and reduced TBI bilaterally suggesting significant tibial disease. He will be referred to vascular medicine for further evaluation. Patient History Information obtained from Patient. Family History Cancer - Mother,Father, Diabetes - Mother, Heart Disease - Mother, Hypertension - Mother,Father, Stroke - Father, Thyroid Problems - Mother, No family history of Kidney Disease, Lung Disease, Seizures, Tuberculosis. Social History Former smoker - cigar about 4 to 5 days, Marital Status - Divorced, Alcohol Use - Never, Drug Use - No History, Caffeine Use - Daily. Medical History Hospitalization/Surgery History - 08/11/2017, [facility], stomach and back pain. TERON, BLAIS ([id number]) Objective Constitutional Vitals Time Taken: 8:30 AM, Height: 76 in, Weight: 220 lbs, BMI: 26.8, Temperature: 95.5 F, Pulse: 105 bpm, Respiratory Rate: 16 breaths/min, Blood Pressure: 153/85 mmHg. Integumentary (Hair, Skin) Wound #1 status is Open. Original cause of wound was Gradually Appeared. The wound is located on the Right Toe Great. The wound measures 1.2cm length x 1.3cm width x 0.1cm depth; 1.225cm^2 area and  0.123cm^3 volume. There is no tunneling or undermining noted. There is a small amount of serosanguineous drainage noted. The wound margin is flat and intact. There is no granulation within the wound bed. There is a large (67-100%) amount of necrotic tissue within the wound bed including Adherent Slough. The periwound skin appearance exhibited: Maceration. The periwound skin appearance did not exhibit: Callus, Crepitus, Excoriation, Induration, Rash, Scarring, Dry/Scaly, Atrophie Blanche, Cyanosis, Ecchymosis, Hemosiderin Staining, Mottled, Pallor, Rubor, Erythema. Periwound temperature was noted as No Abnormality. Wound #2 status is Healed - Epithelialized. Original cause of wound was Gradually Appeared. The wound is located on the Right Toe - Web between 3rd and 4th. The wound measures 0cm length x 0cm width x 0cm depth; 0cm^2 area and 0cm^3 volume. 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 There was a Selective/Open Wound Non-Viable Tissue Debridement with a total area of 1.56 sq cm performed by Lawanda Cousins, NP. With the following instrument(s): Curette to remove Non-Viable tissue/material. Material removed includes Slough and Fibrin/Exudate and after achieving pain control using Lidocaine 4% Topical Solution. 1 specimen was taken by a Swab and sent to the lab per facility protocol. A time out was conducted at 09:03, prior to the start of the procedure. A Minimum amount of bleeding was  controlled with Pressure. The procedure was tolerated well with a pain level of 0 throughout and a pain level of 0 following the procedure. Patient s Deirdre Pippins of [place]AR[place], BARANEK ([id number]) Consciousness post procedure was recorded as Awake and Alert. Post Debridement Measurements: 1.2cm length x 1.3cm width x 0.2cm depth; 0.245cm^3 volume. Character of Wound/Ulcer Post Debridement requires further debridement. Severity of Tissue Post Debridement is: Fat layer exposed. Post procedure Diagnosis Wound #1: Same as Pre-Procedure Plan Wound Cleansing: Wound #1 Right Toe Great: Cleanse wound with mild soap and water Wound #3 Medial Toe Great: Cleanse wound with mild soap and water Anesthetic (add to Medication List): Wound #1 Right Toe Great: Topical Lidocaine 4% cream applied to wound bed prior to debridement (In Clinic Only). Wound #3 Medial Toe Great: Topical Lidocaine 4% cream applied to wound bed prior to debridement (In Clinic Only). Primary Wound Dressing: Wound #1 Right Toe Great: Saline moistened gauze - only dampened Santyl Ointment Wound #3 Medial Toe Great: Saline moistened gauze - only dampened Santyl Ointment Secondary Dressing: Wound #1 Right Toe Great: Gauze and Kerlix/Conform Wound #3 Medial Toe Great: Gauze and Kerlix/Conform Dressing Change Frequency: Wound #1 Right Toe Great: Change dressing every day. Follow-up Appointments: Wound #1 Right Toe Great: Return Appointment in 1 week. Wound #3 Medial Toe Great: Return Appointment in 1 week. Edema Control: Wound #1 Right Toe Great: Elevate legs to the level of the heart and pump ankles as often as possible Wound #3 Medial Toe Great: Elevate legs to the level of the heart and pump ankles as often as possible Off-Loading: Wound #1 Right Toe Great: Open toe surgical shoe to: Wound #3 Medial Toe Great: Open toe surgical shoe to: Additional Orders / Instructions: Wound #1 Right Toe Great: Increase protein  intake. Wound #3 Medial Toe Great: Increase protein intake. Laboratory ordered were: NUMAIR, MASDEN ([id number]) Wound culture routine Radiology ordered were: Magnetic Resonance Imaging (MRI) - right foot Consults ordered were: Vascular The following medication(s) was prescribed: lidocaine topical 4 % cream 1 1 cream topical was prescribed at facility MRI right mid-forefoot vascular referral continue with santyl wound culture; continue on clindamycin for now Electronic Signature(s) Signed: 12/06/2017 9:35:56 AM By: Lawanda Cousins Previous Signature: 12/06/2017 9:18:25 AM Version By: Lawanda Cousins Entered By: Lawanda Cousins on 12/06/2017 09:35:56 Timothy Gibson ([id number]) -------------------------------------------------------------------------------- ROS/PFSH Details Patient Name: Timothy Gibson Date of Service: 12/06/2017 8:30 AM Medical Record Number: [id number] Patient Account Number: [id number] Date of Birth/Sex: 11/27/1950 (66 y.o. M) Treating RN: Ahmed Prima Primary Care Provider: PATIENT, NO Other Clinician: Referring Provider: Konrad Felix Treating  Provider/Extender: Lawanda Cousins Weeks in Treatment: 1 Information Obtained From Patient Wound History Eyes Medical History: Negative for: Cataracts; Glaucoma; Optic Neuritis Ear/Nose/Mouth/Throat Medical History: Negative for: Chronic sinus problems/congestion; Middle ear problems Hematologic/Lymphatic Medical History: Positive for: Lymphedema Negative for: Anemia; Hemophilia; Human Immunodeficiency Virus; Sickle Cell Disease Respiratory Medical History: Negative for: Aspiration; Asthma; Chronic Obstructive Pulmonary Disease (COPD); Pneumothorax; Sleep Apnea; Tuberculosis Cardiovascular Medical History: Positive for: Arrhythmia; Hypertension Negative for: Congestive Heart Failure; Coronary Artery Disease; Deep Vein Thrombosis; Hypotension; Myocardial Infarction; Peripheral Arterial Disease;  Peripheral Venous Disease; Phlebitis; Vasculitis Gastrointestinal Medical History: Negative for: Cirrhosis ; Colitis; Crohnos; Hepatitis A; Hepatitis B; Hepatitis C Endocrine Medical History: Positive for: Type II Diabetes Time with diabetes: 30 years Treated with: Diet Blood sugar tested every day: No Genitourinary EUSTACE, HUR ([id number]) Medical History: Positive for: End Stage Renal Disease Immunological Medical History: Negative for: Lupus Erythematosus; Raynaudos Musculoskeletal Medical History: Positive for: Rheumatoid Arthritis; Osteoarthritis Negative for: Gout; Osteomyelitis Neurologic Medical History: Negative for: Dementia; Neuropathy; Quadriplegia; Paraplegia; Seizure Disorder Oncologic Medical History: Negative for: Received Chemotherapy; Received Radiation Psychiatric Medical History: Positive for: Confinement Anxiety Negative for: Anorexia/bulimia Immunizations Pneumococcal Vaccine: Received Pneumococcal Vaccination: Yes Implantable Devices Hospitalization / Surgery History Name of Hospital Purpose of Hospitalization/Surgery Date [facility] stomach and back pain 08/11/2017 Family and Social History Cancer: Yes - Mother,Father; Diabetes: Yes - Mother; Heart Disease: Yes - Mother; Hypertension: Yes - Mother,Father; Kidney Disease: No; Lung Disease: No; Seizures: No; Stroke: Yes - Father; Thyroid Problems: Yes - Mother; Tuberculosis: No; Former smoker - cigar about 4 to 5 days; Marital Status - Divorced; Alcohol Use: Never; Drug Use: No History; Caffeine Use: Daily; Financial Concerns: No; Food, Clothing or Shelter Needs: No; Support System Lacking: No; Transportation Concerns: No; Advanced Directives: Yes (Not Provided); Patient does not want information on Advanced Directives; Do not resuscitate: No; Living Will: Yes (Not Provided); Medical Power of Attorney: Yes (Not Provided) Physician Affirmation I have reviewed and agree with the above  information. Electronic Signature(s) Signed: 12/07/2017 6:54:27 AM By: Lawanda Cousins Signed: 12/07/2017 5:38:20 PM By: Alric Quan Entered By: Lawanda Cousins on 12/06/2017 09:16:22 DENVER, BENTSON ([id number]) -------------------------------------------------------------------------------- Thermal Details Patient Name: Timothy Gibson Date of Service: 12/06/2017 Medical Record Number: [id number] Patient Account Number: [id number] Date of Birth/Sex: [birth year] (67 y.o. 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 I10 Essential (primary) hypertension E44.1 Mild protein-calorie malnutrition Facility Procedures CPT4 Code: [id number] Description: [phone] - DEBRIDE WOUND 1ST 20 SQ CM OR < ICD-10 Diagnosis Description L97.512 Non-pressure chronic ulcer of other part of right foot with fat la Modifier: yer exposed Quantity: 1 Physician Procedures CPT4 Code: 5170017 Description: Martin - WC PHYS DEBR WO ANESTH 20 SQ CM ICD-10 Diagnosis Description 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

## 2017-12-10 DIAGNOSIS — N186 End stage renal disease: Secondary | ICD-10-CM | POA: Diagnosis not present

## 2017-12-10 DIAGNOSIS — Z992 Dependence on renal dialysis: Secondary | ICD-10-CM | POA: Diagnosis not present

## 2017-12-10 DIAGNOSIS — D509 Iron deficiency anemia, unspecified: Secondary | ICD-10-CM | POA: Diagnosis not present

## 2017-12-10 DIAGNOSIS — D631 Anemia in chronic kidney disease: Secondary | ICD-10-CM | POA: Diagnosis not present

## 2017-12-10 DIAGNOSIS — N2581 Secondary hyperparathyroidism of renal origin: Secondary | ICD-10-CM | POA: Diagnosis not present

## 2017-12-10 DIAGNOSIS — M86171 Other acute osteomyelitis, right ankle and foot: Secondary | ICD-10-CM | POA: Diagnosis not present

## 2017-12-10 LAB — AEROBIC CULTURE W GRAM STAIN (SUPERFICIAL SPECIMEN)

## 2017-12-10 LAB — AEROBIC CULTURE  (SUPERFICIAL SPECIMEN)

## 2017-12-11 ENCOUNTER — Other Ambulatory Visit: Payer: Self-pay | Admitting: Physician Assistant

## 2017-12-11 DIAGNOSIS — E11621 Type 2 diabetes mellitus with foot ulcer: Secondary | ICD-10-CM

## 2017-12-11 DIAGNOSIS — L97409 Non-pressure chronic ulcer of unspecified heel and midfoot with unspecified severity: Principal | ICD-10-CM

## 2017-12-11 NOTE — Progress Notes (Signed)
[name], [name] ([id number]) Visit Report for 12/06/2017 Arrival Information Details Patient Name: Timothy Gibson, Timothy Gibson Date of Service: 12/06/2017 8:30 AM Medical Record Number: [id number] Patient Account Number: [id number] Date of Birth/Sex: 05-19-1951 (67 y.o. M) Treating RN: Secundino Ginger Primary Care Aeryn Medici: PATIENT, NO Other Clinician: Referring Seirra Kos: Konrad Felix Treating Vashon Riordan/Extender: Cathie Olden in Treatment: 1 Visit Information History Since Last Visit All ordered tests and consults were completed: No Patient Arrived: Ambulatory Added or deleted any medications: No Arrival Time: 08:27 Any new allergies or adverse reactions: No Accompanied By: spouse Had a fall or experienced change in No Transfer Assistance: None activities of daily living that may affect Patient Identification Verified: Yes risk of falls: Secondary Verification Process Completed: Yes Signs or symptoms of abuse/neglect since last visito No Patient Requires Transmission-Based No Hospitalized since last visit: No Precautions: Implantable device outside of the clinic excluding No Patient Has Alerts: No cellular tissue based products placed in the center since last visit: Has Dressing in Place as Prescribed: Yes Has Compression in Place as Prescribed: Yes Pain Present Now: No Electronic Signature(s) Signed: 12/06/2017 9:00:16 AM By: Gretta Cool, BSN, RN, CWS, Kim RN, BSN Entered By: Gretta Cool, BSN, RN, CWS, Kim on 12/06/2017 09:00:16 Timothy Gibson ([id number]) -------------------------------------------------------------------------------- Encounter Discharge Information Details Patient Name: Timothy Gibson Date of Service: 12/06/2017 8:30 AM Medical Record Number: [id number] Patient Account Number: [id number] Date of Birth/Sex: 23-Nov-1950 (66 y.o. M) Treating RN: Roger Shelter Primary Care Abbey Veith: PATIENT, NO Other Clinician: Referring Tiombe Tomeo: Konrad Felix Treating  Roseann Kees/Extender: Cathie Olden in Treatment: 1 Encounter Discharge Information Items Discharge Condition: Stable Ambulatory Status: Ambulatory Discharge Destination: Home Transportation: Private Auto Schedule Follow-up Appointment: Yes Clinical Summary of Care: Electronic Signature(s) Signed: 12/06/2017 11:59:50 AM By: Roger Shelter Entered By: Roger Shelter on 12/06/2017 09:26:15 Timothy Gibson ([id number]) -------------------------------------------------------------------------------- Lower Extremity Assessment Details Patient Name: Timothy Gibson Date of Service: 12/06/2017 8:30 AM Medical Record Number: [id number] Patient Account Number: [id number] Date of Birth/Sex: [birth year] (66 y.o. M) Treating RN: Secundino Ginger Primary Care Marialena Wollen: PATIENT, NO Other Clinician: Referring Elfa Wooton: Konrad Felix Treating Yvette Loveless/Extender: Cathie Olden in Treatment: 1 Edema Assessment Assessed: [Left: No] [Right: No] Edema: [Left: Ye] [Right: s] Calf Left: Right: Point of Measurement: cm From Medial Instep cm 43 cm Ankle Left: Right: Point of Measurement: cm From Medial Instep cm 30.5 cm Vascular Assessment Claudication: Claudication Assessment [Right:None] Pulses: Dorsalis Pedis Palpable: [Right:Yes] Posterior Tibial Extremity colors, hair growth, and conditions: Hair Growth on Extremity: [Right:Yes] Temperature of Extremity: [Right:Warm] Capillary Refill: [Right:< 3 seconds] Toe Nail Assessment Left: Right: Thick: Yes Discolored: Yes Deformed: Yes Improper Length and Hygiene: No Electronic Signature(s) Signed: 12/11/2017 11:57:38 AM By: Secundino Ginger Entered By: Secundino Ginger on 12/06/2017 08:46:51 Timothy Gibson ([id number]) -------------------------------------------------------------------------------- Multi Wound Chart Details Patient Name: Timothy Gibson Date of Service: 12/06/2017 8:30 AM Medical Record Number: [id number] Patient Account  Number: [id number] Date of Birth/Sex: [birth year] (66 y.o. M) Treating RN: Ahmed Prima Primary Care Aul Mangieri: PATIENT, NO Other Clinician: Referring Geramy Lamorte: Konrad Felix Treating Oluwaseyi Raffel/Extender: Cathie Olden in Treatment: 1 Vital Signs Height(in): 76 Pulse(bpm): 105 Weight(lbs): 220 Blood Pressure(mmHg): 153/85 Body Mass Index(BMI): 27 Temperature(F): 95.5 Respiratory Rate 16 (breaths/min): Photos: [1:No Photos] [2:No Photos] [N/A:N/A] Wound Location: [1:Right Toe Great] [2:Right Toe - Web between 3rd and 4th] [N/A:N/A] Wounding Event: [1:Gradually Appeared] [2:Gradually Appeared] [N/A:N/A] Primary Etiology: [1:Diabetic Wound/Ulcer of the Lower Extremity] [2:Diabetic Wound/Ulcer of the Lower Extremity] [N/A:N/A] Comorbid History: [1:Lymphedema, Arrhythmia, Hypertension, Type II Diabetes,  End Stage Renal Disease, Rheumatoid Arthritis, Osteoarthritis, Confinement Anxiety] [2:N/A] [N/A:N/A] Date Acquired: [1:11/22/2017] [2:11/26/2017] [N/A:N/A] Weeks of Treatment: [1:1] [2:1] [N/A:N/A] Wound Status: [1:Open] [2:Healed - Epithelialized] [N/A:N/A] Measurements L x W x D [1:1.2x1.3x0.1] [2:0x0x0] [N/A:N/A] (cm) Area (cm) : [1:1.225] [2:0] [N/A:N/A] Volume (cm) : [1:0.123] [2:0] [N/A:N/A] % Reduction in Area: [1:48.00%] [2:100.00%] [N/A:N/A] % Reduction in Volume: [1:47.90%] [2:100.00%] [N/A:N/A] Classification: [1:Unable to visualize wound bed Grade 1] [N/A:N/A] Exudate Amount: [1:Small] [2:N/A] [N/A:N/A] Exudate Type: [1:Serosanguineous] [2:N/A] [N/A:N/A] Exudate Color: [1:red, brown] [2:N/A] [N/A:N/A] Wound Margin: [1:Flat and Intact] [2:N/A] [N/A:N/A] Granulation Amount: [1:None Present (0%)] [2:N/A] [N/A:N/A] Necrotic Amount: [1:Large (67-100%)] [2:N/A] [N/A:N/A] Exposed Structures: [1:Fascia: No Fat Layer (Subcutaneous Tissue) Exposed: No Tendon: No Muscle: No Joint: No Bone: No] [2:N/A] [N/A:N/A] Debridement: [1:Debridement - Excisional 09:03] [2:N/A N/A]  [N/A:N/A N/A] Pre-procedure Verification/Time Out Taken: Pain Control: Lidocaine 4% Topical Solution N/A N/A Tissue Debrided: Subcutaneous, Slough N/A N/A Level: Skin/Subcutaneous Tissue N/A N/A Debridement Area (sq cm): 1.56 N/A N/A Instrument: Curette N/A N/A Specimen: Swab N/A N/A Number of Specimens 1 N/A N/A Taken: Bleeding: Minimum N/A N/A Hemostasis Achieved: Pressure N/A N/A Procedural Pain: 0 N/A N/A Post Procedural Pain: 0 N/A N/A Debridement Treatment Procedure was tolerated well N/A N/A Response: Post Debridement 1.2x1.3x0.2 N/A N/A Measurements L x W x D (cm) Post Debridement Volume: 0.245 N/A N/A (cm) Periwound Skin Texture: Excoriation: No No Abnormalities Noted N/A Induration: No Callus: No Crepitus: No Rash: No Scarring: No Periwound Skin Moisture: Maceration: Yes No Abnormalities Noted N/A Dry/Scaly: No Periwound Skin Color: Atrophie Blanche: No No Abnormalities Noted N/A Cyanosis: No Ecchymosis: No Erythema: No Hemosiderin Staining: No Mottled: No Pallor: No Rubor: No Temperature: No Abnormality N/A N/A Tenderness on Palpation: No No N/A Wound Preparation: Ulcer Cleansing: N/A N/A Rinsed/Irrigated with Saline Topical Anesthetic Applied: Other: lidocaine 4% Procedures Performed: Debridement N/A N/A Treatment Notes Electronic Signature(s) Signed: 12/06/2017 9:17:41 AM By: Lawanda Cousins Entered By: Lawanda Cousins on 12/06/2017 09:17:41 Timothy Gibson, Timothy Gibson ([id number]) -------------------------------------------------------------------------------- [place] Details Patient Name: Timothy Gibson Date of Service: 12/06/2017 8:30 AM Medical Record Number: [id number] Patient Account Number: [id number] Date of Birth/Sex: Jan 10, 1951 (66 y.o. M) Treating RN: Ahmed Prima Primary Care Krzysztof Reichelt: PATIENT, NO Other Clinician: Referring Leala Bryand: Konrad Felix Treating Laquida Cotrell/Extender: Cathie Olden in Treatment:  1 Active Inactive ` Abuse / Safety / Falls / Self Care Management Nursing Diagnoses: Potential for falls Goals: Patient will remain injury free related to falls Date Initiated: 11/27/2017 Target Resolution Date: 12/14/2017 Goal Status: Active Interventions: Assess fall risk on admission and as needed Notes: ` Nutrition Nursing Diagnoses: Impaired glucose control: actual or potential Goals: Patient/caregiver verbalizes understanding of need to maintain therapeutic glucose control per primary care physician Date Initiated: 11/27/2017 Target Resolution Date: 12/28/2017 Goal Status: Active Interventions: Provide education on elevated blood sugars and impact on wound healing Notes: ` Orientation to the Wound Care Program Nursing Diagnoses: Knowledge deficit related to the wound healing center program Goals: Patient/caregiver will verbalize understanding of the [place] Date Initiated: 11/27/2017 Target Resolution Date: 12/28/2017 Goal Status: Active Interventions: Timothy Gibson, Timothy Gibson ([id number]) Provide education on orientation to the wound center Notes: ` Soft Tissue Infection Nursing Diagnoses: Potential for infection: soft tissue Goals: Patient will remain free of wound infection Date Initiated: 11/27/2017 Target Resolution Date: 12/28/2017 Goal Status: Active Interventions: Assess signs and symptoms of infection every visit Treatment Activities: Systemic antibiotics : 11/27/2017 Notes: ` Wound/Skin Impairment Nursing Diagnoses: Impaired tissue integrity Goals: Ulcer/skin breakdown will  have a volume reduction of 80% by week 12 Date Initiated: 11/27/2017 Target Resolution Date: 02/27/2018 Goal Status: Active Interventions: Assess patient/caregiver ability to perform ulcer/skin care regimen upon admission and as needed Treatment Activities: Referred to DME Juelle Dickmann for dressing supplies : 11/27/2017 Skin care regimen initiated :  11/27/2017 Notes: Electronic Signature(s) Signed: 12/07/2017 5:38:20 PM By: Alric Quan Entered By: Alric Quan on 12/06/2017 09:01:53 Timothy Gibson ([id number]) -------------------------------------------------------------------------------- Pain Assessment Details Patient Name: Timothy Gibson Date of Service: 12/06/2017 8:30 AM Medical Record Number: [id number] Patient Account Number: [id number] Date of Birth/Sex: 09/11/1950 (67 y.o. M) Treating RN: Secundino Ginger Primary Care Malachy Coleman: PATIENT, NO Other Clinician: Referring Devonte Migues: Konrad Felix Treating Verne Cove/Extender: Cathie Olden in Treatment: 1 Active Problems Location of Pain Severity and Description of Pain Patient Has Paino No Site Locations Pain Management and Medication Current Pain Management: Electronic Signature(s) Signed: 12/06/2017 9:00:21 AM By: Gretta Cool, BSN, RN, CWS, Kim RN, BSN Signed: 12/11/2017 11:57:38 AM By: Secundino Ginger Entered By: Gretta Cool, BSN, RN, CWS, Kim on 12/06/2017 09:00:21 Timothy Gibson ([id number]) -------------------------------------------------------------------------------- Patient/Caregiver Education Details Patient Name: Timothy Gibson Date of Service: 12/06/2017 8:30 AM Medical Record Number: [id number] Patient Account Number: [id number] Date of Birth/Gender: 03/20/1951 (67 y.o. M) Treating RN: Roger Shelter Primary Care Physician: PATIENT, NO Other Clinician: Referring Physician: Konrad Felix Treating Physician/Extender: Cathie Olden in Treatment: 1 Education Assessment Education Provided To: Patient Education Topics Provided Wound Debridement: Handouts: Wound Debridement Methods: Explain/Verbal Responses: State content correctly Wound/Skin Impairment: Handouts: Caring for Your Ulcer Methods: Explain/Verbal Responses: State content correctly Electronic Signature(s) Signed: 12/06/2017 11:59:50 AM By: Roger Shelter Entered By: Roger Shelter  on 12/06/2017 09:26:34 Timothy Gibson ([id number]) -------------------------------------------------------------------------------- Wound Assessment Details Patient Name: Timothy Gibson Date of Service: 12/06/2017 8:30 AM Medical Record Number: [id number] Patient Account Number: [id number] Date of Birth/Sex: 1951-05-27 (67 y.o. M) Treating RN: Secundino Ginger Primary Care Thamas Appleyard: PATIENT, NO Other Clinician: Referring Sylvanna Burggraf: Konrad Felix Treating Lynell Greenhouse/Extender: Cathie Olden in Treatment: 1 Wound Status Wound Number: 1 Primary Diabetic Wound/Ulcer of the Lower Extremity Etiology: Wound Location: Right Toe Great Wound Open Wounding Event: Gradually Appeared Status: Date Acquired: 11/22/2017 Comorbid Lymphedema, Arrhythmia, Hypertension, Type II Weeks Of Treatment: 1 History: Diabetes, End Stage Renal Disease, Clustered Wound: No Rheumatoid Arthritis, Osteoarthritis, Confinement Anxiety Photos Photo Uploaded By: Gretta Cool, BSN, RN, CWS, Kim on 12/07/2017 17:15:09 Wound Measurements Length: (cm) 1.2 % Reducti Width: (cm) 1.3 % Reducti Depth: (cm) 0.1 Tunneling Area: (cm) 1.225 Undermin Volume: (cm) 0.123 on in Area: 48% on in Volume: 47.9% : No ing: No Wound Description Classification: Unable to visualize wound bed Foul Odor Wound Margin: Flat and Intact Slough/Fi Exudate Amount: Small Exudate Type: Serosanguineous Exudate Color: red, brown After Cleansing: No brino Yes Wound Bed Granulation Amount: None Present (0%) Exposed Structure Necrotic Amount: Large (67-100%) Fascia Exposed: No Necrotic Quality: Adherent Slough Fat Layer (Subcutaneous Tissue) Exposed: No Tendon Exposed: No Muscle Exposed: No Joint Exposed: No Bone Exposed: No LOWELL, MAKARA. ([id number]) Periwound Skin Texture Texture Color No Abnormalities Noted: No No Abnormalities Noted: No Callus: No Atrophie Blanche: No Crepitus: No Cyanosis: No Excoriation: No Ecchymosis:  No Induration: No Erythema: No Rash: No Hemosiderin Staining: No Scarring: No Mottled: No Pallor: No Moisture Rubor: No No Abnormalities Noted: No Dry / Scaly: No Temperature / Pain Maceration: Yes Temperature: No Abnormality Wound Preparation Ulcer Cleansing: Rinsed/Irrigated with Saline Topical Anesthetic Applied: Other: lidocaine 4%, Treatment Notes Wound #1 (Right Toe Great) 1. Cleansed with: Clean  wound with Normal Saline 2. Anesthetic Topical Lidocaine 4% cream to wound bed prior to debridement 4. Dressing Applied: Santyl Ointment 5. Secondary Dressing Applied Dry Gauze Notes saline wet gauze, cover with dry gauze wrap with conform and stretch net Electronic Signature(s) Signed: 12/11/2017 11:57:38 AM By: Secundino Ginger Entered By: Secundino Ginger on 12/06/2017 08:50:43 Timothy Gibson ([id number]) -------------------------------------------------------------------------------- Wound Assessment Details Patient Name: Timothy Gibson Date of Service: 12/06/2017 8:30 AM Medical Record Number: [id number] Patient Account Number: [id number] Date of Birth/Sex: [birth year] (66 y.o. M) Treating RN: Secundino Ginger Primary Care Nanako Stopher: PATIENT, NO Other Clinician: Referring Levonne Carreras: Konrad Felix Treating Dakari Stabler/Extender: Cathie Olden in Treatment: 1 Wound Status Wound Number: 2 Primary Diabetic Wound/Ulcer of the Lower Etiology: Extremity Wound Location: Right Toe - Web between 3rd and 4th Wound Status: Healed - Epithelialized Wounding Event: Gradually Appeared Date Acquired: 11/26/2017 Weeks Of Treatment: 1 Clustered Wound: No Photos Photo Uploaded By: Gretta Cool, BSN, RN, CWS, Kim on 12/07/2017 17:15:09 Wound Measurements Length: (cm) 0 % Red Width: (cm) 0 % Red Depth: (cm) 0 Area: (cm) 0 Volume: (cm) 0 uction in Area: 100% uction in Volume: 100% Wound Description Classification: Grade 1 Periwound Skin Texture Texture Color No Abnormalities Noted: No No  Abnormalities Noted: No Moisture No Abnormalities Noted: No Electronic Signature(s) Signed: 12/11/2017 11:57:38 AM By: Secundino Ginger Entered By: Secundino Ginger on 12/06/2017 08:40:32 Timothy Gibson ([id number]) -------------------------------------------------------------------------------- Vitals Details Patient Name: Timothy Gibson Date of Service: 12/06/2017 8:30 AM Medical Record Number: [id number] Patient Account Number: [id number] Date of Birth/Sex: Aug 22, 1950 (67 y.o. M) Treating RN: Secundino Ginger Primary Care Esau Fridman: PATIENT, NO Other Clinician: Referring Salinda Snedeker: Konrad Felix Treating Janiylah Hannis/Extender: Cathie Olden in Treatment: 1 Vital Signs Time Taken: 08:30 Temperature (F): 95.5 Height (in): 76 Pulse (bpm): 105 Weight (lbs): 220 Respiratory Rate (breaths/min): 16 Body Mass Index (BMI): 26.8 Blood Pressure (mmHg): 153/85 Reference Range: 80 - 120 mg / dl Electronic Signature(s) Signed: 12/06/2017 9:00:32 AM By: Gretta Cool, BSN, RN, CWS, Kim RN, BSN Entered By: Gretta Cool, BSN, RN, CWS, Kim on 12/06/2017 09:00:31

## 2017-12-12 DIAGNOSIS — Z992 Dependence on renal dialysis: Secondary | ICD-10-CM | POA: Diagnosis not present

## 2017-12-12 DIAGNOSIS — D509 Iron deficiency anemia, unspecified: Secondary | ICD-10-CM | POA: Diagnosis not present

## 2017-12-12 DIAGNOSIS — D631 Anemia in chronic kidney disease: Secondary | ICD-10-CM | POA: Diagnosis not present

## 2017-12-12 DIAGNOSIS — N186 End stage renal disease: Secondary | ICD-10-CM | POA: Diagnosis not present

## 2017-12-12 DIAGNOSIS — M86171 Other acute osteomyelitis, right ankle and foot: Secondary | ICD-10-CM | POA: Diagnosis not present

## 2017-12-12 DIAGNOSIS — N2581 Secondary hyperparathyroidism of renal origin: Secondary | ICD-10-CM | POA: Diagnosis not present

## 2017-12-13 ENCOUNTER — Encounter: Payer: Medicare Other | Attending: Nurse Practitioner | Admitting: Nurse Practitioner

## 2017-12-13 DIAGNOSIS — Z992 Dependence on renal dialysis: Secondary | ICD-10-CM | POA: Insufficient documentation

## 2017-12-13 DIAGNOSIS — Z6826 Body mass index (BMI) 26.0-26.9, adult: Secondary | ICD-10-CM | POA: Diagnosis not present

## 2017-12-13 DIAGNOSIS — N186 End stage renal disease: Secondary | ICD-10-CM | POA: Diagnosis not present

## 2017-12-13 DIAGNOSIS — E11621 Type 2 diabetes mellitus with foot ulcer: Secondary | ICD-10-CM | POA: Insufficient documentation

## 2017-12-13 DIAGNOSIS — L97512 Non-pressure chronic ulcer of other part of right foot with fat layer exposed: Secondary | ICD-10-CM | POA: Insufficient documentation

## 2017-12-13 DIAGNOSIS — E441 Mild protein-calorie malnutrition: Secondary | ICD-10-CM | POA: Diagnosis not present

## 2017-12-13 DIAGNOSIS — Z794 Long term (current) use of insulin: Secondary | ICD-10-CM | POA: Diagnosis not present

## 2017-12-13 DIAGNOSIS — B952 Enterococcus as the cause of diseases classified elsewhere: Secondary | ICD-10-CM | POA: Diagnosis not present

## 2017-12-13 DIAGNOSIS — F17290 Nicotine dependence, other tobacco product, uncomplicated: Secondary | ICD-10-CM | POA: Insufficient documentation

## 2017-12-13 DIAGNOSIS — E1122 Type 2 diabetes mellitus with diabetic chronic kidney disease: Secondary | ICD-10-CM | POA: Insufficient documentation

## 2017-12-13 DIAGNOSIS — I120 Hypertensive chronic kidney disease with stage 5 chronic kidney disease or end stage renal disease: Secondary | ICD-10-CM | POA: Diagnosis not present

## 2017-12-13 DIAGNOSIS — B961 Klebsiella pneumoniae [K. pneumoniae] as the cause of diseases classified elsewhere: Secondary | ICD-10-CM | POA: Diagnosis not present

## 2017-12-14 DIAGNOSIS — N186 End stage renal disease: Secondary | ICD-10-CM | POA: Diagnosis not present

## 2017-12-14 DIAGNOSIS — D631 Anemia in chronic kidney disease: Secondary | ICD-10-CM | POA: Diagnosis not present

## 2017-12-14 DIAGNOSIS — Z992 Dependence on renal dialysis: Secondary | ICD-10-CM | POA: Diagnosis not present

## 2017-12-14 DIAGNOSIS — D509 Iron deficiency anemia, unspecified: Secondary | ICD-10-CM | POA: Diagnosis not present

## 2017-12-14 DIAGNOSIS — N2581 Secondary hyperparathyroidism of renal origin: Secondary | ICD-10-CM | POA: Diagnosis not present

## 2017-12-14 DIAGNOSIS — M86171 Other acute osteomyelitis, right ankle and foot: Secondary | ICD-10-CM | POA: Diagnosis not present

## 2017-12-14 NOTE — Progress Notes (Signed)
Timothy Gibson ([id number]) Visit Report for 12/13/2017 Chief Complaint Document Details Patient Name: Timothy Gibson, Timothy Gibson Date of Service: 12/13/2017 8:30 AM Medical Record Number: [id number] Patient Account Number: [id number] Date of Birth/Sex: Nov 24, 1950 (67 y.o. M) Treating RN: Ahmed Prima Primary Care Provider: PATIENT, NO Other Clinician: Referring Provider: Konrad Felix Treating Provider/Extender: Cathie Olden in Treatment: 2 Information Obtained from: Patient Chief Complaint Right Great Toe Electronic Signature(s) Signed: 12/13/2017 10:03:04 AM By: Lawanda Cousins Entered By: Lawanda Cousins on 12/13/2017 10:03:03 Timothy Gibson ([id number]) -------------------------------------------------------------------------------- Debridement Details Patient Name: Timothy Gibson Date of Service: 12/13/2017 8:30 AM Medical Record Number: [id number] Patient Account Number: [id number] Date of Birth/Sex: 1951-04-18 (67 y.o. M) Treating RN: Ahmed Prima Primary Care Provider: PATIENT, NO Other Clinician: Referring Provider: Konrad Felix Treating Provider/Extender: Cathie Olden in Treatment: 2 Debridement Performed for Wound #1 Right Toe Great Assessment: Performed By: Physician Lawanda Cousins, NP Debridement Type: Debridement Severity of Tissue Pre Fat layer exposed Debridement: Pre-procedure Verification/Time Yes - 08:34 Out Taken: Start Time: 08:34 Pain Control: Lidocaine 4% Topical Solution Total Area Debrided (L x W): 1 (cm) x 1.2 (cm) = 1.2 (cm) Tissue and other material Non-Viable, Slough, Subcutaneous, Fibrin/Exudate, Slough debrided: Level: Skin/Subcutaneous Tissue Debridement Description: Excisional Instrument: Curette Bleeding: Minimum Hemostasis Achieved: Pressure End Time: 08:38 Procedural Pain: 0 Post Procedural Pain: 0 Response to Treatment: Procedure was tolerated well Level of Consciousness: Awake and Alert Post Procedure  Vitals: Temperature: 98.4 Pulse: 88 Respiratory Rate: 16 Blood Pressure: Systolic Blood Pressure: 637 Diastolic Blood Pressure: 88 Post Debridement Measurements of Total Wound Length: (cm) 1 Width: (cm) 1.2 Depth: (cm) 0.2 Volume: (cm) 0.188 Character of Wound/Ulcer Post Debridement: Requires Further Debridement Severity of Tissue Post Debridement: Fat layer exposed Post Procedure Diagnosis Same as Pre-procedure Electronic Signature(s) Signed: 12/13/2017 10:02:53 AM By: Lawanda Cousins Signed: 12/13/2017 2:43:37 PM By: Lu Duffel ([id number]) Entered By: Lawanda Cousins on 12/13/2017 10:02:52 NEHEMYAH, FOUSHEE ([id number]) -------------------------------------------------------------------------------- HPI Details Patient Name: Timothy Gibson Date of Service: 12/13/2017 8:30 AM Medical Record Number: [id number] Patient Account Number: [id number] Date of Birth/Sex: 06-02-1951 (67 y.o. M) Treating RN: Ahmed Prima Primary Care Provider: PATIENT, NO Other Clinician: Referring Provider: Konrad Felix Treating Provider/Extender: Cathie Olden in Treatment: 2 History of Present Illness HPI Description: 11/27/17 on evaluation today patient presents for initial evaluation concerning an issue that he has been having with his right great toe which began last Thursday 11/22/17. He has a history of diabetes mellitus type to which he has had for greater than 30 years currently he is on insulin. Subsequently he also has a history of hypertension. On physical exam inspection is also appears he may have some peripheral vascular disease with his ABI's being noncompressible bilaterally. He is on dialysis as well and this is on Monday, Wednesday, and Friday. Patient was hospitalized back in January/February 2019 although this was more for stomach/back pain though they never told me exactly what was going on. This is according to the patient. Subsequently the ulcer  which is between the third and fourth toe when space of the right foot as well is on the plantar aspect of the fourth toe is due to him having cleaned his toes this morning and he states that his finger which is large split the toe tissue in between causing the wounds that we currently see. With that being said he states this has happened before I explained I would definitely recommend that he not do this any longer.  He can use a small soft washcloth to clean between the toes without causing trauma. Subsequently the right great toe hatchery does show evidence of necrotic tissue present on the surface of the wound specifically there is callous and dead skin surrounding which is trapping fluid causing problems as far as the wound is concerned. The surface of the wound does show slough although due to his vascular flow I'm not going to sharply debride this today I think I will selectively debride away the necrotic skin as well is callous from around the surrounding so this will not continue to be a moisture issue. Nonetheless the patient has no pain he does have diabetic neuropathy. 12/06/17-He is here in follow-up evaluation for a right great toe ulcer. He is voicing no complaints or concerns. He continues to infrequently/socially smokes cigars with no desire to quit. He has been compliant with offloading, using open toed surgical shoe; has been compliant using Santyl daily. He continues on clindamycin although takes it inconsistently secondary to indigestion. The plain film x-ray performed on 5/23 impression: Soft tissue swelling of the left first toe and is noted with probable irregular lucency involving distal tuft of first distal phalanx concerning for osteomyelitis. MRI may be performed for further evaluation; MRI ordered. The vascular evaluation performed on 5/24 field non-compressible ABIs bilaterally and reduced TBI bilaterally suggesting significant tibial disease. He will be referred to  vascular medicine for further evaluation. 12/13/17-He is here in follow-up evaluation for right great toe ulcer. He has appointment next Thursday for the MRI and vascular evaluation. Wound culture obtained last week grew abundant Klebsiella oxytoca (multidrug sensitivity), and abundant enteric coccus faecalis (sensitive to ampicillin). Ampicillin and Cipro were called in, along with a probiotic. In light of his appointments next Thursday he will follow-up in 2 weeks, we will continue with Santyl Electronic Signature(s) Signed: 12/13/2017 10:04:39 AM By: Lawanda Cousins Entered By: Lawanda Cousins on 12/13/2017 10:04:38 Timothy Gibson ([id number]) -------------------------------------------------------------------------------- Physician Orders Details Patient Name: Timothy Gibson Date of Service: 12/13/2017 8:30 AM Medical Record Number: [id number] Patient Account Number: [id number] Date of Birth/Sex: January 10, 1951 (67 y.o. M) Treating RN: Ahmed Prima Primary Care Provider: PATIENT, NO Other Clinician: Referring Provider: Konrad Felix Treating Provider/Extender: Cathie Olden in Treatment: 2 Verbal / Phone Orders: Yes Clinician: Pinkerton, Debi Read Back and Verified: Yes Diagnosis Coding Wound Cleansing Wound #1 Right Toe Great o Cleanse wound with mild soap and water Anesthetic (add to Medication List) Wound #1 Right Toe Great o Topical Lidocaine 4% cream applied to wound bed prior to debridement (In Clinic Only). Primary Wound Dressing Wound #1 Right Toe Great o Saline moistened gauze - only dampened o Santyl Ointment Secondary Dressing Wound #1 Right Toe Great o Gauze and Kerlix/Conform Dressing Change Frequency Wound #1 Right Toe Great o Change dressing every day. Follow-up Appointments Wound #1 Right Toe Great o Return Appointment in 1 week. Edema Control Wound #1 Right Toe Great o Elevate legs to the level of the heart and pump ankles as often  as possible Off-Loading Wound #1 Right Toe Great o Open toe surgical shoe to: Additional Orders / Instructions Wound #1 Right Toe Great o Increase protein intake. Consults o Infectious Disease HRISHIKESH, HOEG ([id number]) Patient Medications Allergies: no known allergies Notifications Medication Indication Start End lidocaine DOSE 1 - topical 4 % cream - 1 cream topical ampicillin 12/13/2017 DOSE oral 500 mg capsule - capsule oral; one capsule every 12 hours for 10 days Cipro 12/13/2017 DOSE oral 500  mg tablet - tablet oral, one tablet daily for 10 days Florastor 12/13/2017 DOSE oral 250 mg capsule - capsule oral twice daily for 21 daily Electronic Signature(s) Signed: 12/13/2017 10:00:33 AM By: Lawanda Cousins Entered By: Lawanda Cousins on 12/13/2017 10:00:33 ETAI, COPADO ([id number]) -------------------------------------------------------------------------------- Prescription 12/13/2017 Patient Name: Timothy Gibson Provider: Lawanda Cousins NP Date of Birth: Jul 21, 1950 NPI#: [id number] Sex: M DEA#: [id number] Phone #: [phone] License #: Patient Address: [facility] [place], [place] [address] [address], [place], [place] [address] [phone] Allergies no known allergies Medication Medication: Route: Strength: Form: lidocaine 4 % topical cream topical 4% cream Class: TOPICAL LOCAL ANESTHETICS Dose: Frequency / Time: Indication: 1 1 cream topical Number of Refills: Number of Units: 0 Generic Substitution: Start Date: End Date: One Time Use: Substitution Permitted No Note to Pharmacy: Signature(s): Date(s): Electronic Signature(s) Signed: 12/13/2017 4:23:13 PM By: Lawanda Cousins Entered By: Lawanda Cousins on 12/13/2017 10:00:34 Timothy Gibson ([id number]) --------------------------------------------------------------------------------  Problem List  Details Patient Name: Timothy Gibson Date of Service: 12/13/2017 8:30 AM Medical Record Number: [id number] Patient Account Number: [id number] Date of Birth/Sex: 1951/05/22 (67 y.o. 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o. M) Treating RN: Ahmed Prima Primary Care Provider: PATIENT, NO Other Clinician: Referring Provider: Konrad Felix Treating Provider/Extender: Cathie Olden in Treatment: 2 Subjective Chief Complaint Information obtained from Patient Right Great Toe History of Present Illness (HPI) 11/27/17 on evaluation today patient presents for initial evaluation concerning an issue that he has been having with his right great toe which began last Thursday 11/22/17. He has a history of diabetes mellitus type to which he has had for greater than 30 years currently he is on insulin.  Subsequently he also has a history of hypertension. On physical exam inspection is also appears he may have some peripheral vascular disease with his ABI's being noncompressible bilaterally. He is on dialysis as well and this is on Monday, Wednesday, and Friday. Patient was hospitalized back in January/February 2019 although this was more for stomach/back pain though they never told me exactly what was going on. This is according to the patient. Subsequently the ulcer which is between the third and fourth toe when space of the right foot as well is on the plantar aspect of the fourth toe is due to him having cleaned his toes this morning and he states that his finger which is large split the toe tissue in between causing the wounds that we currently see. With that being said he states this has happened before I explained I would definitely recommend that he not do this any longer.  He can use a small soft washcloth to clean between the toes without causing trauma. Subsequently the right great toe hatchery does show evidence of necrotic tissue present on the surface of the wound specifically there is callous and dead skin surrounding which is trapping fluid causing problems as far as the wound is concerned. The surface of the wound does show slough although due to his vascular flow I'm not going to sharply debride this today I think I will selectively debride away the necrotic skin as well is callous from around the surrounding so this will not continue to be a moisture issue. Nonetheless the patient has no pain he does have diabetic neuropathy. 12/06/17-He is here in follow-up evaluation for a right great toe ulcer. He is voicing no complaints or concerns. He continues to infrequently/socially smokes cigars with no desire to quit. He has been compliant with offloading, using open toed surgical shoe; has been compliant using Santyl daily. He continues on clindamycin although takes it inconsistently  secondary to indigestion. The plain film x-ray performed on 5/23 impression: Soft tissue swelling of the left first toe and is noted with probable irregular lucency involving distal tuft of first distal phalanx concerning for osteomyelitis. MRI may be performed for further evaluation; MRI ordered. The vascular evaluation performed on 5/24 field non-compressible ABIs bilaterally and reduced TBI bilaterally suggesting significant tibial disease. He will be referred to vascular medicine for further evaluation. 12/13/17-He is here in follow-up evaluation for right great toe ulcer. He has appointment next Thursday for the MRI and vascular evaluation. Wound culture obtained last week grew abundant Klebsiella oxytoca (multidrug sensitivity), and abundant enteric coccus faecalis (sensitive to ampicillin). Ampicillin and Cipro were called in, along with a probiotic. In light of his appointments next Thursday he will follow-up in 2 weeks, we will continue with Santyl Patient History Information obtained from Patient. Family History Cancer - Mother,Father, Diabetes - Mother, Heart Disease - Mother, Hypertension - Mother,Father, Stroke - Father, Thyroid Problems - Mother, No family history of Kidney Disease, Lung Disease, Seizures, Tuberculosis. Social History Former smoker - cigar about 4 to 5 days, Marital Status - Divorced, Alcohol Use - Never, Drug Use - No History, Caffeine Use - Daily. CARTIER, WASHKO ([id number]) Medical History Hospitalization/Surgery History - 08/11/2017, [facility], stomach and back pain. Objective Constitutional Vitals Time Taken: 8:19 AM, Height: 76 in, Weight: 220 lbs, BMI: 26.8, Temperature: 98.4 F, Pulse: 88 bpm, Respiratory Rate: 16 breaths/min, Blood Pressure: 184/88 mmHg. Integumentary (Hair, Skin) Wound #1 status is Open. Original cause of wound was Gradually Appeared. The wound is located on the Right Toe Great. The wound measures 1cm length x 1.2cm width x 0.1cm depth;  0.942cm^2 area and 0.094cm^3 volume. There is Fat Layer (Subcutaneous Tissue) Exposed exposed. There is no tunneling or undermining noted. There is a medium amount of serosanguineous drainage noted. The wound margin is flat and intact. There is no granulation within the wound bed. There is a large (67-100%) amount of necrotic tissue within the wound bed including Eschar and Adherent Slough. The periwound skin appearance exhibited: Maceration. The periwound skin appearance did not exhibit: Callus, Crepitus, Excoriation, Induration, Rash, Scarring, Dry/Scaly, Atrophie Blanche, Cyanosis, Ecchymosis, Hemosiderin Staining, Mottled, Pallor, Rubor, Erythema. Periwound temperature was noted as No Abnormality. 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 There was a Excisional Skin/Subcutaneous Tissue Debridement with a total area of 1.2 sq cm performed by Lawanda Cousins, NP. With the following instrument(s): Curette to remove Non-Viable tissue/material. Material removed includes Subcutaneous Tissue, Slough, and Fibrin/Exudate after achieving pain control using Lidocaine 4% Topical Solution. No specimens were taken. A time out was conducted at 08:34, RAHM, MINIX ([id number]) prior to the start of the procedure. A Minimum amount of bleeding was controlled with Pressure. The procedure was tolerated well with a pain level of 0 throughout and a pain level of 0 following the procedure. Patient s Level of Consciousness post procedure was recorded as  Awake and Alert. Post Debridement Measurements: 1cm length x 1.2cm width x 0.2cm depth; 0.188cm^3 volume. Character of Wound/Ulcer Post Debridement requires further debridement. Severity of Tissue Post Debridement is: Fat layer exposed. Post procedure Diagnosis Wound #1: Same as Pre-Procedure Plan Wound Cleansing: Wound #1 Right Toe Great: Cleanse wound with mild soap and water Anesthetic (add to Medication List): Wound #1 Right Toe Great: Topical Lidocaine 4% cream applied to wound bed prior to debridement (In Clinic Only). Primary Wound Dressing: Wound #1 Right Toe Great: Saline moistened gauze - only dampened Santyl Ointment Secondary Dressing: Wound #1 Right Toe Great: Gauze and Kerlix/Conform Dressing Change Frequency: Wound #1 Right Toe Great: Change dressing every day. Follow-up Appointments: Wound #1 Right Toe Great: Return Appointment in 1 week. Edema Control: Wound #1 Right Toe Great: Elevate legs to the level of the heart and pump ankles as often as possible Off-Loading: Wound #1 Right Toe Great: Open toe surgical shoe to: Additional Orders / Instructions: Wound #1 Right Toe Great: Increase protein intake. Consults ordered were: Infectious Disease The following medication(s) was prescribed: lidocaine topical 4 % cream 1 1 cream topical was prescribed at facility ampicillin oral 500 mg capsule capsule oral; one capsule every 12 hours for 10 days starting 12/13/2017 Cipro oral 500 mg tablet tablet oral, one tablet daily for 10 days starting 12/13/2017 Florastor oral 250 mg capsule capsule oral twice daily for 21 daily starting 12/13/2017 referral to infectious diseases; prefer to establish now given the culture for Enterococcus faecalis and only one oral option; if he responds to current antibiotic treatment and MRI negative for osteomyelitis would consider canceling referral WASSIM, KIRKSEY ([id number]) Electronic Signature(s) Signed: 12/13/2017 10:07:19 AM By:  Lawanda Cousins Entered By: Lawanda Cousins on 12/13/2017 10:07:19 Timothy Gibson ([id number]) -------------------------------------------------------------------------------- ROS/PFSH Details Patient Name: Timothy Gibson Date of Service: 12/13/2017 8:30 AM Medical Record Number: [id number] Patient Account Number: [id number] Date of Birth/Sex: Jan 02, 1951 (67 y.o. M) Treating RN: Ahmed Prima Primary Care Provider: PATIENT, NO Other Clinician: Referring Provider: Konrad Felix Treating Provider/Extender: Cathie Olden in Treatment: 2 Information Obtained From Patient Wound History Eyes Medical History: Negative for: Cataracts; Glaucoma; Optic Neuritis Ear/Nose/Mouth/Throat Medical History: Negative for: Chronic sinus problems/congestion; Middle ear problems Hematologic/Lymphatic Medical History: Positive for: Lymphedema Negative for: Anemia; Hemophilia; Human Immunodeficiency Virus; Sickle Cell Disease Respiratory Medical History: Negative for: Aspiration; Asthma; Chronic Obstructive Pulmonary Disease (COPD); Pneumothorax; Sleep Apnea; Tuberculosis Cardiovascular Medical History: Positive for: Arrhythmia; Hypertension Negative for: Congestive Heart Failure; Coronary Artery Disease; Deep Vein Thrombosis; Hypotension; Myocardial Infarction; Peripheral Arterial Disease; Peripheral Venous Disease; Phlebitis; Vasculitis Gastrointestinal Medical History: Negative for: Cirrhosis ; Colitis; Crohnos; Hepatitis A; Hepatitis B; Hepatitis  C Endocrine Medical History: Positive for: Type II Diabetes Time with diabetes: 30 years Treated with: Diet Blood sugar tested every day: No Genitourinary PURVIS, SIDLE ([id number]) Medical History: Positive for: End Stage Renal Disease Immunological Medical History: Negative for: Lupus Erythematosus; Raynaudos Musculoskeletal Medical History: Positive for: Rheumatoid Arthritis; Osteoarthritis Negative for: Gout;  Osteomyelitis Neurologic Medical History: Negative for: Dementia; Neuropathy; Quadriplegia; Paraplegia; Seizure Disorder Oncologic Medical History: Negative for: Received Chemotherapy; Received Radiation Psychiatric Medical History: Positive for: Confinement Anxiety Negative for: Anorexia/bulimia Immunizations Pneumococcal Vaccine: Received Pneumococcal Vaccination: Yes Implantable Devices Hospitalization / Surgery History Name of Hospital Purpose of Hospitalization/Surgery Date [facility] stomach and back pain 08/11/2017 Family and Social History Cancer: Yes - Mother,Father; Diabetes: Yes - Mother; Heart Disease: Yes - Mother; Hypertension: Yes - Mother,Father; Kidney Disease: No; Lung Disease: No; Seizures: No; Stroke: Yes - Father; Thyroid Problems: Yes - Mother; Tuberculosis: No; Former smoker - cigar about 4 to 5 days; Marital Status - Divorced; Alcohol Use: Never; Drug Use: No History; Caffeine Use: Daily; Financial Concerns: No; Food, Clothing or Shelter Needs: No; Support System Lacking: No; Transportation Concerns: No; Advanced Directives: Yes (Not Provided); Patient does not want information on Advanced Directives; Do not resuscitate: No; Living Will: Yes (Not Provided); Medical Power of Attorney: Yes (Not Provided) Physician Affirmation I have reviewed and agree with the above information. Electronic Signature(s) Signed: 12/13/2017 2:43:37 PM By: Alric Quan Signed: 12/13/2017 4:23:13 PM By: Lawanda Cousins Entered By: Lawanda Cousins on 12/13/2017 10:05:34 Timothy Gibson ([id number]) -------------------------------------------------------------------------------- [place] Details Patient Name: Timothy Gibson Date of Service: 12/13/2017 Medical Record Number: [id number] Patient Account Number: [id number] Date of Birth/Sex: 19-Feb-1951 (67 y.o. 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 I10 Essential (primary) hypertension E44.1 Mild protein-calorie malnutrition Facility Procedures CPT4 Code: [id number] Description: 09628 - DEB SUBQ TISSUE 20 SQ CM/< ICD-10 Diagnosis Description E11.621 Type 2 diabetes mellitus with foot ulcer L97.512 Non-pressure chronic ulcer of other part of right foot with fat l Modifier: ayer exposed Quantity: 1 Physician Procedures CPT4 Code: [id number] Description: 11042 - WC PHYS SUBQ TISS 20 SQ CM ICD-10 Diagnosis Description E11.621 Type 2 diabetes mellitus with foot ulcer L97.512 Non-pressure chronic ulcer of other part of right foot with fat l Modifier: ayer exposed Quantity: 1 Electronic Signature(s) Signed: 12/13/2017 10:07:32 AM By: Lawanda Cousins Entered By: Lawanda Cousins on 12/13/2017 10:07:31

## 2017-12-14 NOTE — Progress Notes (Signed)
Timothy Gibson, Timothy Gibson ([id number]) Visit Report for 12/13/2017 Arrival Information Details Patient Name: Timothy Gibson Date of Service: 12/13/2017 8:30 AM Medical Record Number: [id number] Patient Account Number: [id number] Date of Birth/Sex: [birth year] (67 y.o. M) Treating RN: Roger Shelter Primary Care Azaylea Maves: PATIENT, NO Other Clinician: Referring Chelbie Jarnagin: Konrad Felix Treating Sheala Dosh/Extender: Cathie Olden in Treatment: 2 Visit Information History Since Last Visit All ordered tests and consults were completed: No Patient Arrived: Ambulatory Added or deleted any medications: No Arrival Time: 08:18 Any new allergies or adverse reactions: No Accompanied By: caregiver Had a fall or experienced change in No Transfer Assistance: None activities of daily living that may affect Patient Identification Verified: Yes risk of falls: Secondary Verification Process Completed: Yes Signs or symptoms of abuse/neglect since last visito No Patient Requires Transmission-Based No Hospitalized since last visit: No Precautions: Implantable device outside of the clinic excluding No Patient Has Alerts: No cellular tissue based products placed in the center since last visit: Pain Present Now: No Electronic Signature(s) Signed: 12/13/2017 10:44:30 AM By: Roger Shelter Entered By: Roger Shelter on 12/13/2017 08:18:33 Timothy Gibson ([id number]) -------------------------------------------------------------------------------- Lower Extremity Assessment Details Patient Name: Timothy Gibson Date of Service: 12/13/2017 8:30 AM Medical Record Number: [id number] Patient Account Number: [id number] Date of Birth/Sex: 03/31/1951 (67 y.o. M) Treating RN: Roger Shelter Primary Care Lake Breeding: PATIENT, NO Other Clinician: Referring Lea Baine: Konrad Felix Treating Demetris Meinhardt/Extender: Cathie Olden in Treatment: 2 Edema Assessment Assessed: [Left: No] [Right: No] [Left:  Edema] [Right: :] Calf Left: Right: Point of Measurement: 37 cm From Medial Instep cm 42 cm Ankle Left: Right: Point of Measurement: 11 cm From Medial Instep cm 30.7 cm Vascular Assessment Claudication: Claudication Assessment [Right:None] Pulses: Dorsalis Pedis Palpable: [Right:Yes] Posterior Tibial Extremity colors, hair growth, and conditions: Extremity Color: [Right:Hyperpigmented] Hair Growth on Extremity: [Right:No] Temperature of Extremity: [Right:Warm] Capillary Refill: [Right:< 3 seconds] Toe Nail Assessment Left: Right: Thick: Yes Discolored: Yes Deformed: No Improper Length and Hygiene: No Electronic Signature(s) Signed: 12/13/2017 10:44:30 AM By: Roger Shelter Entered By: Roger Shelter on 12/13/2017 08:29:04 Timothy Gibson ([id number]) -------------------------------------------------------------------------------- Multi Wound Chart Details Patient Name: Timothy Gibson Date of Service: 12/13/2017 8:30 AM Medical Record Number: [id number] Patient Account Number: [id number] Date of Birth/Sex: 26-May-1951 (67 y.o. M) Treating RN: Ahmed Prima Primary Care Thelton Graca: PATIENT, NO Other Clinician: Referring Uchechukwu Dhawan: Konrad Felix Treating Loranzo Desha/Extender: Cathie Olden in Treatment: 2 Vital Signs Height(in): 76 Pulse(bpm): 20 Weight(lbs): 220 Blood Pressure(mmHg): 184/88 Body Mass Index(BMI): 27 Temperature(F): 98.4 Respiratory Rate 16 (breaths/min): Photos: [1:No Photos] [N/A:N/A] Wound Location: [1:Right Toe Great] [N/A:N/A] Wounding Event: [1:Gradually Appeared] [N/A:N/A] Primary Etiology: [1:Diabetic Wound/Ulcer of the Lower Extremity] [N/A:N/A] Comorbid History: [1:Lymphedema, Arrhythmia, Hypertension, Type II Diabetes, End Stage Renal Disease, Rheumatoid Arthritis, Osteoarthritis, Confinement Anxiety] [N/A:N/A] Date Acquired: [1:11/22/2017] [N/A:N/A] Weeks of Treatment: [1:2] [N/A:N/A] Wound Status: [1:Open]  [N/A:N/A] Measurements L x W x D [1:1x1.2x0.1] [N/A:N/A] (cm) Area (cm) : [1:0.942] [N/A:N/A] Volume (cm) : [1:0.094] [N/A:N/A] % Reduction in Area: [1:60.00%] [N/A:N/A] % Reduction in Volume: [1:60.20%] [N/A:N/A] Classification: [1:Unable to visualize wound bed N/A] Exudate Amount: [1:Medium] [N/A:N/A] Exudate Type: [1:Serosanguineous] [N/A:N/A] Exudate Color: [1:red, brown] [N/A:N/A] Wound Margin: [1:Flat and Intact] [N/A:N/A] Granulation Amount: [1:None Present (0%)] [N/A:N/A] Necrotic Amount: [1:Large (67-100%)] [N/A:N/A] Necrotic Tissue: [1:Eschar, Adherent Slough] [N/A:N/A] Exposed Structures: [1:Fat Layer (Subcutaneous Tissue) Exposed: Yes Fascia: No Tendon: No Muscle: No Joint: No Bone: No] [N/A:N/A] Epithelialization: [1:Small (1-33%)] [N/A:N/A] Debridement: [1:Debridement - Excisional] [N/A:N/A] Pre-procedure 08:34 N/A N/A Verification/Time Out Taken: Pain Control:  Lidocaine 4% Topical Solution N/A N/A Tissue Debrided: Callus, Subcutaneous, Slough N/A N/A Level: Skin/Subcutaneous Tissue N/A N/A Debridement Area (sq cm): 1.2 N/A N/A Instrument: Curette N/A N/A Bleeding: Minimum N/A N/A Hemostasis Achieved: Pressure N/A N/A Procedural Pain: 0 N/A N/A Post Procedural Pain: 0 N/A N/A Debridement Treatment Procedure was tolerated well N/A N/A Response: Post Debridement 1x1.2x0.2 N/A N/A Measurements L x W x D (cm) Post Debridement Volume: 0.188 N/A N/A (cm) Periwound Skin Texture: Excoriation: No N/A N/A Induration: No Callus: No Crepitus: No Rash: No Scarring: No Periwound Skin Moisture: Maceration: Yes N/A N/A Dry/Scaly: No Periwound Skin Color: Atrophie Blanche: No N/A N/A Cyanosis: No Ecchymosis: No Erythema: No Hemosiderin Staining: No Mottled: No Pallor: No Rubor: No Temperature: No Abnormality N/A N/A Tenderness on Palpation: No N/A N/A Wound Preparation: Ulcer Cleansing: N/A N/A Rinsed/Irrigated with Saline Topical Anesthetic  Applied: Other: lidocaine 4% Procedures Performed: Debridement N/A N/A Treatment Notes Electronic Signature(s) Signed: 12/13/2017 10:02:29 AM By: Lawanda Cousins Entered By: Lawanda Cousins on 12/13/2017 10:02:29 Timothy Gibson, Timothy Gibson ([id number]) -------------------------------------------------------------------------------- [place] Details Patient Name: Timothy Gibson Date of Service: 12/13/2017 8:30 AM Medical Record Number: [id number] Patient Account Number: [id number] Date of Birth/Sex: 1951/05/13 (67 y.o. M) Treating RN: Ahmed Prima Primary Care Syndi Pua: PATIENT, NO Other Clinician: Referring Lashondra Vaquerano: Konrad Felix Treating Gearl Kimbrough/Extender: Cathie Olden in Treatment: 2 Active Inactive ` Abuse / Safety / Falls / Self Care Management Nursing Diagnoses: Potential for falls Goals: Patient will remain injury free related to falls Date Initiated: 11/27/2017 Target Resolution Date: 12/14/2017 Goal Status: Active Interventions: Assess fall risk on admission and as needed Notes: ` Nutrition Nursing Diagnoses: Impaired glucose control: actual or potential Goals: Patient/caregiver verbalizes understanding of need to maintain therapeutic glucose control per primary care physician Date Initiated: 11/27/2017 Target Resolution Date: 12/28/2017 Goal Status: Active Interventions: Provide education on elevated blood sugars and impact on wound healing Notes: ` Orientation to the Wound Care Program Nursing Diagnoses: Knowledge deficit related to the wound healing center program Goals: Patient/caregiver will verbalize understanding of the [place] Date Initiated: 11/27/2017 Target Resolution Date: 12/28/2017 Goal Status: Active Interventions: Timothy Gibson, Timothy Gibson ([id number]) Provide education on orientation to the wound center Notes: ` Soft Tissue Infection Nursing Diagnoses: Potential for infection: soft tissue Goals: Patient  will remain free of wound infection Date Initiated: 11/27/2017 Target Resolution Date: 12/28/2017 Goal Status: Active Interventions: Assess signs and symptoms of infection every visit Treatment Activities: Systemic antibiotics : 11/27/2017 Notes: ` Wound/Skin Impairment Nursing Diagnoses: Impaired tissue integrity Goals: Ulcer/skin breakdown will have a volume reduction of 80% by week 12 Date Initiated: 11/27/2017 Target Resolution Date: 02/27/2018 Goal Status: Active Interventions: Assess patient/caregiver ability to perform ulcer/skin care regimen upon admission and as needed Treatment Activities: Referred to DME Barnie Sopko for dressing supplies : 11/27/2017 Skin care regimen initiated : 11/27/2017 Notes: Electronic Signature(s) Signed: 12/13/2017 2:43:37 PM By: Alric Quan Entered By: Alric Quan on 12/13/2017 08:34:29 Timothy Gibson ([id number]) -------------------------------------------------------------------------------- Pain Assessment Details Patient Name: Timothy Gibson Date of Service: 12/13/2017 8:30 AM Medical Record Number: [id number] Patient Account Number: [id number] Date of Birth/Sex: Jun 29, 1951 (67 y.o. M) Treating RN: Roger Shelter Primary Care Leyan Branden: PATIENT, NO Other Clinician: Referring Kalep Full: Konrad Felix Treating Haiden Clucas/Extender: Cathie Olden in Treatment: 2 Active Problems Location of Pain Severity and Description of Pain Patient Has Paino No Site Locations Pain Management and Medication Current Pain Management: Electronic Signature(s) Signed: 12/13/2017 10:44:30 AM By: Roger Shelter Entered By: Roger Shelter on  12/13/2017 08:18:39 Timothy Gibson, Timothy Gibson ([id number]) -------------------------------------------------------------------------------- Wound Assessment Details Patient Name: Timothy Gibson, Timothy Gibson Date of Service: 12/13/2017 8:30 AM Medical Record Number: [id number] Patient Account Number: [id number] Date of  Birth/Sex: May 27, 1951 (67 y.o. M) Treating RN: Roger Shelter Primary Care Mckenzey Parcell: PATIENT, NO Other Clinician: Referring Amanat Hackel: Konrad Felix Treating Halo Shevlin/Extender: Cathie Olden in Treatment: 2 Wound Status Wound Number: 1 Primary Diabetic Wound/Ulcer of the Lower Extremity Etiology: Wound Location: Right Toe Great Wound Open Wounding Event: Gradually Appeared Status: Date Acquired: 11/22/2017 Comorbid Lymphedema, Arrhythmia, Hypertension, Type II Weeks Of Treatment: 2 History: Diabetes, End Stage Renal Disease, Clustered Wound: No Rheumatoid Arthritis, Osteoarthritis, Confinement Anxiety Photos Photo Uploaded By: Roger Shelter on 12/13/2017 10:40:51 Wound Measurements Length: (cm) 1 Width: (cm) 1.2 Depth: (cm) 0.1 Area: (cm) 0.942 Volume: (cm) 0.094 % Reduction in Area: 60% % Reduction in Volume: 60.2% Epithelialization: Small (1-33%) Tunneling: No Undermining: No Wound Description Classification: Unable to visualize wound bed Wound Margin: Flat and Intact Exudate Amount: Medium Exudate Type: Serosanguineous Exudate Color: red, brown Foul Odor After Cleansing: No Slough/Fibrino Yes Wound Bed Granulation Amount: None Present (0%) Exposed Structure Necrotic Amount: Large (67-100%) Fascia Exposed: No Necrotic Quality: Eschar, Adherent Slough Fat Layer (Subcutaneous Tissue) Exposed: Yes Tendon Exposed: No Muscle Exposed: No Joint Exposed: No Bone Exposed: No Timothy Gibson, Timothy Gibson. ([id number]) Periwound Skin Texture Texture Color No Abnormalities Noted: No No Abnormalities Noted: No Callus: No Atrophie Blanche: No Crepitus: No Cyanosis: No Excoriation: No Ecchymosis: No Induration: No Erythema: No Rash: No Hemosiderin Staining: No Scarring: No Mottled: No Pallor: No Moisture Rubor: No No Abnormalities Noted: No Dry / Scaly: No Temperature / Pain Maceration: Yes Temperature: No Abnormality Wound Preparation Ulcer Cleansing:  Rinsed/Irrigated with Saline Topical Anesthetic Applied: Other: lidocaine 4%, Electronic Signature(s) Signed: 12/13/2017 10:44:30 AM By: Roger Shelter Entered By: Roger Shelter on 12/13/2017 08:25:11 Timothy Gibson, Timothy Gibson ([id number]) -------------------------------------------------------------------------------- Vitals Details Patient Name: Timothy Gibson Date of Service: 12/13/2017 8:30 AM Medical Record Number: [id number] Patient Account Number: [id number] Date of Birth/Sex: [birth year] (67 y.o. M) Treating RN: Roger Shelter Primary Care Nixie Laube: PATIENT, NO Other Clinician: Referring Timothy Gibson: Konrad Felix Treating Trayveon Beckford/Extender: Cathie Olden in Treatment: 2 Vital Signs Time Taken: 08:19 Temperature (F): 98.4 Height (in): 76 Pulse (bpm): 88 Weight (lbs): 220 Respiratory Rate (breaths/min): 16 Body Mass Index (BMI): 26.8 Blood Pressure (mmHg): 184/88 Reference Range: 80 - 120 mg / dl Electronic Signature(s) Signed: 12/13/2017 10:44:30 AM By: Roger Shelter Entered By: Roger Shelter on 12/13/2017 08:19:36

## 2017-12-17 DIAGNOSIS — M86171 Other acute osteomyelitis, right ankle and foot: Secondary | ICD-10-CM | POA: Diagnosis not present

## 2017-12-17 DIAGNOSIS — N2581 Secondary hyperparathyroidism of renal origin: Secondary | ICD-10-CM | POA: Diagnosis not present

## 2017-12-17 DIAGNOSIS — N186 End stage renal disease: Secondary | ICD-10-CM | POA: Diagnosis not present

## 2017-12-17 DIAGNOSIS — D509 Iron deficiency anemia, unspecified: Secondary | ICD-10-CM | POA: Diagnosis not present

## 2017-12-17 DIAGNOSIS — D631 Anemia in chronic kidney disease: Secondary | ICD-10-CM | POA: Diagnosis not present

## 2017-12-17 DIAGNOSIS — Z992 Dependence on renal dialysis: Secondary | ICD-10-CM | POA: Diagnosis not present

## 2017-12-19 ENCOUNTER — Ambulatory Visit: Payer: Medicare Other

## 2017-12-19 DIAGNOSIS — Z992 Dependence on renal dialysis: Secondary | ICD-10-CM | POA: Diagnosis not present

## 2017-12-19 DIAGNOSIS — M86171 Other acute osteomyelitis, right ankle and foot: Secondary | ICD-10-CM | POA: Diagnosis not present

## 2017-12-19 DIAGNOSIS — D631 Anemia in chronic kidney disease: Secondary | ICD-10-CM | POA: Diagnosis not present

## 2017-12-19 DIAGNOSIS — D509 Iron deficiency anemia, unspecified: Secondary | ICD-10-CM | POA: Diagnosis not present

## 2017-12-19 DIAGNOSIS — N186 End stage renal disease: Secondary | ICD-10-CM | POA: Diagnosis not present

## 2017-12-19 DIAGNOSIS — N2581 Secondary hyperparathyroidism of renal origin: Secondary | ICD-10-CM | POA: Diagnosis not present

## 2017-12-20 ENCOUNTER — Ambulatory Visit (INDEPENDENT_AMBULATORY_CARE_PROVIDER_SITE_OTHER): Payer: Medicare Other | Admitting: Vascular Surgery

## 2017-12-20 ENCOUNTER — Ambulatory Visit
Admission: RE | Admit: 2017-12-20 | Discharge: 2017-12-20 | Disposition: A | Discharge status: Home / Self Care | Payer: Medicare Other | Source: Ambulatory Visit | Attending: Physician Assistant | Admitting: Physician Assistant

## 2017-12-20 ENCOUNTER — Encounter (INDEPENDENT_AMBULATORY_CARE_PROVIDER_SITE_OTHER): Payer: Self-pay | Admitting: Vascular Surgery

## 2017-12-20 DIAGNOSIS — J449 Chronic obstructive pulmonary disease, unspecified: Secondary | ICD-10-CM | POA: Diagnosis not present

## 2017-12-20 DIAGNOSIS — L97409 Non-pressure chronic ulcer of unspecified heel and midfoot with unspecified severity: Secondary | ICD-10-CM | POA: Insufficient documentation

## 2017-12-20 DIAGNOSIS — I10 Essential (primary) hypertension: Secondary | ICD-10-CM

## 2017-12-20 DIAGNOSIS — R600 Localized edema: Secondary | ICD-10-CM | POA: Diagnosis not present

## 2017-12-20 DIAGNOSIS — N186 End stage renal disease: Secondary | ICD-10-CM | POA: Diagnosis not present

## 2017-12-20 DIAGNOSIS — E11621 Type 2 diabetes mellitus with foot ulcer: Secondary | ICD-10-CM | POA: Insufficient documentation

## 2017-12-20 DIAGNOSIS — I7025 Atherosclerosis of native arteries of other extremities with ulceration: Secondary | ICD-10-CM

## 2017-12-21 ENCOUNTER — Encounter (INDEPENDENT_AMBULATORY_CARE_PROVIDER_SITE_OTHER): Payer: Self-pay | Admitting: Vascular Surgery

## 2017-12-21 DIAGNOSIS — J449 Chronic obstructive pulmonary disease, unspecified: Secondary | ICD-10-CM | POA: Insufficient documentation

## 2017-12-21 DIAGNOSIS — M86171 Other acute osteomyelitis, right ankle and foot: Secondary | ICD-10-CM | POA: Diagnosis not present

## 2017-12-21 DIAGNOSIS — Z992 Dependence on renal dialysis: Secondary | ICD-10-CM | POA: Diagnosis not present

## 2017-12-21 DIAGNOSIS — E1159 Type 2 diabetes mellitus with other circulatory complications: Secondary | ICD-10-CM | POA: Insufficient documentation

## 2017-12-21 DIAGNOSIS — I70219 Atherosclerosis of native arteries of extremities with intermittent claudication, unspecified extremity: Secondary | ICD-10-CM | POA: Insufficient documentation

## 2017-12-21 DIAGNOSIS — D509 Iron deficiency anemia, unspecified: Secondary | ICD-10-CM | POA: Diagnosis not present

## 2017-12-21 DIAGNOSIS — I10 Essential (primary) hypertension: Secondary | ICD-10-CM | POA: Insufficient documentation

## 2017-12-21 DIAGNOSIS — N186 End stage renal disease: Secondary | ICD-10-CM | POA: Diagnosis not present

## 2017-12-21 DIAGNOSIS — D631 Anemia in chronic kidney disease: Secondary | ICD-10-CM | POA: Diagnosis not present

## 2017-12-21 DIAGNOSIS — N2581 Secondary hyperparathyroidism of renal origin: Secondary | ICD-10-CM | POA: Diagnosis not present

## 2017-12-21 NOTE — Progress Notes (Signed)
MRN : [id number]  Timothy Gibson is a 67 y.o. (12-27-1950) male who presents with chief complaint of  Chief Complaint  Patient presents with  . New Patient (Initial Visit)    ABnormal ABI's  .  History of Present Illness: The patient is seen for evaluation of painful lower extremities and diminished pulses associated with ulceration of the right foot.  The patient notes the ulcer has been present for multiple weeks and has not been improving.  It is very painful and has had some drainage.  No specific history of trauma noted by the patient.  The patient denies fever or chills.  the patient does have diabetes which has been difficult to control.  Patient notes prior to the ulcer developing the extremities were painful particularly with ambulation or activity and the discomfort is very consistent day today. Typically, the pain occurs at less than one block, progress is as activity continues to the point that the patient must stop walking. Resting including standing still for several minutes allowed resumption of the activity and the ability to walk a similar distance before stopping again. Uneven terrain and inclined shorten the distance. The pain has been progressive over the past several years.   The patient denies rest pain or dangling of an extremity off the side of the bed during the night for relief. No prior interventions or surgeries.  No history of back problems or DJD of the lumbar sacral spine.   The patient denies amaurosis fugax or recent TIA symptoms. There are no recent neurological changes noted. The patient denies history of DVT, PE or superficial thrombophlebitis. The patient denies recent episodes of angina or shortness of breath.   Current Meds  Medication Sig  . ampicillin (PRINCIPEN) 500 MG capsule TK 1 C PO Q 12 H FOR 10 DAYS  . ciprofloxacin (CIPRO) 500 MG tablet TK 1 T PO D FOR 10 DAYS  . Ferric Citrate (AURYXIA) 1 GM 210 MG(Fe) TABS Take 3 tablets by mouth 2  (two) times daily with a meal.  . LEVEMIR FLEXTOUCH 100 UNIT/ML Pen Inject 6-13 Units into the skin at bedtime.   . metoCLOPramide (REGLAN) 5 MG tablet Take 1 tablet (5 mg total) by mouth 3 (three) times daily before meals.  . sevelamer carbonate (RENVELA) 800 MG tablet Take 1,600 mg by mouth 2 (two) times daily with a meal.     Past Medical History:  Diagnosis Date  . Cancer ([place])    right kidney  . Diabetes mellitus without complication ([place])   . Dialysis patient [facility])    on Mon-Wed-Fri dialysis  . ESRD (end stage renal disease) ([place])   . History of gangrene   . Hypertension     Past Surgical History:  Procedure Laterality Date  . COLONOSCOPY WITH PROPOFOL N/A 12/16/2015   Procedure: COLONOSCOPY WITH PROPOFOL;  Surgeon: Manya Silvas, MD;  Location: [facility] ENDOSCOPY;  Service: Endoscopy;  Laterality: N/A;  . KNEE SURGERY    . left forearm AV fistula    . NEPHRECTOMY      Social History Social History   Tobacco Use  . Smoking status: Former Research scientist (life sciences)  . Smokeless tobacco: Never Used  . Tobacco comment: cigar smoker  Substance Use Topics  . Alcohol use: No  . Drug use: No    Family History Family History  Problem Relation Age of Onset  . Hypertension Father   No family history of bleeding/clotting disorders, porphyria or autoimmune disease   No Known  Allergies   REVIEW OF SYSTEMS (Negative unless checked)  Constitutional: [] Weight loss  [] Fever  [] Chills Cardiac: [] Chest pain   [] Chest pressure   [] Palpitations   [] Shortness of breath when laying flat   [] Shortness of breath with exertion. Vascular:  [x] Pain in legs with walking   [] Pain in legs at rest  [] History of DVT   [] Phlebitis   [] Swelling in legs   [] Varicose veins   [x] Non-healing ulcers Pulmonary:   [] Uses home oxygen   [] Productive cough   [] Hemoptysis   [] Wheeze  [] COPD   [] Asthma Neurologic:  [] Dizziness   [] Seizures   [] History of stroke   [] History of TIA  [] Aphasia   [] Vissual changes   [] Weakness  or numbness in arm   [] Weakness or numbness in leg Musculoskeletal:   [] Joint swelling   [] Joint pain   [] Low back pain Hematologic:  [] Easy bruising  [] Easy bleeding   [] Hypercoagulable state   [] Anemic Gastrointestinal:  [] Diarrhea   [] Vomiting  [] Gastroesophageal reflux/heartburn   [] Difficulty swallowing. Genitourinary:  [x] Chronic kidney disease   [] Difficult urination  [] Frequent urination   [] Blood in urine Skin:  [] Rashes   [x] Ulcers  Psychological:  [] History of anxiety   []  History of major depression.  Physical Examination  Vitals:   12/20/17 1054  BP: (!) 187/89  Pulse: 89  Resp: 14  Weight: 254 lb (115.2 kg)  Height: 6\' 4"  (1.93 m)   Body mass index is 30.92 kg/m. Gen: WD/WN, NAD Head: [place]/AT, No temporalis wasting.  Ear/Nose/Throat: Hearing grossly intact, nares w/o erythema or drainage, poor dentition Eyes: PER, EOMI, sclera nonicteric.  Neck: Supple, no masses.  No bruit or JVD.  Pulmonary:  Good air movement, clear to auscultation bilaterally, no use of accessory muscles.  Cardiac: RRR, normal S1, S2, no Murmurs. Vascular: The ulceration tip of the right great toe appears noninfected pale minimal granulation present; left brachiocephalic fistula good thrill good bruit's Vessel Right Left  Radial Palpable Palpable  PT Not Palpable Not Palpable  DP Not Palpable Not Palpable  Gastrointestinal: soft, non-distended. No guarding/no peritoneal signs.  Musculoskeletal: M/S 5/5 throughout.  Bilateral Charcot foot deformity.  Neurologic: CN 2-12 intact. Pain and light touch intact in extremities.  Symmetrical.  Speech is fluent. Motor exam as listed above. Psychiatric: Judgment intact, Mood & affect appropriate for pt's clinical situation. Dermatologic: Venous rashes positive ulcers noted.  No changes consistent with cellulitis. Lymph : No Cervical lymphadenopathy, no lichenification or skin changes of chronic lymphedema.  CBC Lab Results  Component Value Date   WBC  2.8 (L) 05/23/2017   HGB 10.6 (L) 05/23/2017   HCT 33.0 (L) 05/23/2017   MCV 92.3 05/23/2017   PLT 130 (L) 05/23/2017    BMET    Component Value Date/Time   NA 136 05/23/2017 0008   NA 138 08/13/2011 1058   K 4.1 05/23/2017 0008   K 4.3 08/13/2011 1058   CL 95 (L) 05/23/2017 0008   CL 96 (L) 08/13/2011 1058   CO2 26 05/23/2017 0008   CO2 29 08/13/2011 1058   GLUCOSE 308 (H) 05/23/2017 0008   GLUCOSE 294 (H) 08/13/2011 1058   BUN 43 (H) 05/23/2017 0008   BUN 49 (H) 08/13/2011 1058   CREATININE 10.13 (H) 05/23/2017 0008   CREATININE 8.72 (H) 08/13/2011 1058   CALCIUM 8.6 (L) 05/23/2017 0008   CALCIUM 9.0 08/13/2011 1058   GFRNONAA 5 (L) 05/23/2017 0008   GFRNONAA 7 (L) 08/13/2011 1058   GFRAA 5 (L) 05/23/2017 0008  GFRAA 8 (L) 08/13/2011 1058   CrCl cannot be calculated (Patient's most recent lab result is older than the maximum 21 days allowed.).  COAG Lab Results  Component Value Date   INR 1.43 03/20/2017    Radiology Mr Foot Right Wo Contrast  Result Date: 12/20/2017 CLINICAL DATA:  Diabetic patient with a skin ulceration on the right great toe. EXAM: MRI OF THE RIGHT FOREFOOT WITHOUT CONTRAST TECHNIQUE: Multiplanar, multisequence MR imaging of the right forefoot was performed. No intravenous contrast was administered. COMPARISON:  Plain films right foot 11/29/2017. FINDINGS: Bones/Joint/Cartilage There is marrow edema with corresponding decreased signal on T1 weighted imaging throughout the distal phalanx of the great toe consistent with osteomyelitis. Artifactual increase in marrow signal is seen in the distal phalanges of the remaining toes but there is no corresponding T1 hypointensity. Bone marrow signal is otherwise normal. Ligaments Intact. Muscles and Tendons Moderate fatty atrophy of intrinsic musculature the foot. No intramuscular fluid collection. Soft tissues Subcutaneous edema is seen over the dorsum of the foot. Skin ulceration on the plantar surface of the  distal great toe is noted. IMPRESSION: Findings consistent with osteomyelitis throughout the distal phalanx of the great toe. Negative for abscess or septic joint. Electronically Signed   By: Inge Rise M.D.   On: 12/20/2017 13:32   Dg Foot Complete Right  Result Date: 11/29/2017 CLINICAL DATA:  Right foot infection and wound. EXAM: RIGHT FOOT COMPLETE - 3+ VIEW COMPARISON:  None. FINDINGS: Vascular calcifications are noted. Soft tissue swelling of the left first toe is noted with probable irregular lucency involving the distal tuft of the first distal phalanx concerning for osteomyelitis. No dislocation or fracture is noted. IMPRESSION: Soft tissue swelling of left first toe is noted with probable irregular lucency involving distal tuft of first distal phalanx concerning for osteomyelitis. MRI may be performed for further evaluation. Electronically Signed   By: Marijo Conception, M.D.   On: 11/29/2017 13:50      Assessment/Plan 1. Atherosclerosis of native arteries of the extremities with ulceration ([place])  Recommend:  The patient has evidence of severe atherosclerotic changes of both lower extremities associated with ulceration and tissue loss of the right foot.  This represents a limb threatening ischemia and places the patient at the risk for limb loss.  Patient should undergo angiography of the right lower extremities with the hope for intervention for limb salvage.  The risks and benefits as well as the alternative therapies was discussed in detail with the patient.  All questions were answered.  Patient does not agree to proceed with angiography he will think about it and get back to me.  I have asked that he call the office with his decision  The patient will follow up with me in the office after the procedure.    A total of 70 minutes was spent with this patient and greater than 50% was spent in counseling and coordination of care with the patient.  Discussion included the treatment  options for vascular disease including indications for surgery and intervention.  Also discussed is the appropriate timing of treatment.  In addition medical therapy was discussed.  2. End stage renal disease ([place]) Recommend:  The patient is doing well and currently has adequate dialysis access. The patient's dialysis center is not reporting any access issues. Flow pattern is stable when compared to the prior ultrasound.  The patient should have a duplex ultrasound of the dialysis access in 101months.  The patient will follow-up with  me in the office after each ultrasound     3. Essential hypertension Continue antihypertensive medications as already ordered, these medications have been reviewed and there are no changes at this time.   4. Chronic obstructive pulmonary disease, unspecified COPD type ([place]) Continue pulmonary medications and aerosols as already ordered, these medications have been reviewed and there are no changes at this time.      Hortencia Pilar, MD  12/21/2017 3:56 PM

## 2017-12-24 DIAGNOSIS — D631 Anemia in chronic kidney disease: Secondary | ICD-10-CM | POA: Diagnosis not present

## 2017-12-24 DIAGNOSIS — N2581 Secondary hyperparathyroidism of renal origin: Secondary | ICD-10-CM | POA: Diagnosis not present

## 2017-12-24 DIAGNOSIS — N186 End stage renal disease: Secondary | ICD-10-CM | POA: Diagnosis not present

## 2017-12-24 DIAGNOSIS — Z992 Dependence on renal dialysis: Secondary | ICD-10-CM | POA: Diagnosis not present

## 2017-12-24 DIAGNOSIS — M86171 Other acute osteomyelitis, right ankle and foot: Secondary | ICD-10-CM | POA: Diagnosis not present

## 2017-12-24 DIAGNOSIS — D509 Iron deficiency anemia, unspecified: Secondary | ICD-10-CM | POA: Diagnosis not present

## 2017-12-26 DIAGNOSIS — N186 End stage renal disease: Secondary | ICD-10-CM | POA: Diagnosis not present

## 2017-12-26 DIAGNOSIS — N2581 Secondary hyperparathyroidism of renal origin: Secondary | ICD-10-CM | POA: Diagnosis not present

## 2017-12-26 DIAGNOSIS — Z992 Dependence on renal dialysis: Secondary | ICD-10-CM | POA: Diagnosis not present

## 2017-12-26 DIAGNOSIS — D509 Iron deficiency anemia, unspecified: Secondary | ICD-10-CM | POA: Diagnosis not present

## 2017-12-26 DIAGNOSIS — M86171 Other acute osteomyelitis, right ankle and foot: Secondary | ICD-10-CM | POA: Diagnosis not present

## 2017-12-26 DIAGNOSIS — D631 Anemia in chronic kidney disease: Secondary | ICD-10-CM | POA: Diagnosis not present

## 2017-12-27 ENCOUNTER — Ambulatory Visit (INDEPENDENT_AMBULATORY_CARE_PROVIDER_SITE_OTHER): Payer: Medicare Other | Admitting: Internal Medicine

## 2017-12-27 ENCOUNTER — Encounter: Payer: Medicare Other | Admitting: Nurse Practitioner

## 2017-12-27 ENCOUNTER — Encounter: Payer: Self-pay | Admitting: Internal Medicine

## 2017-12-27 ENCOUNTER — Telehealth: Payer: Self-pay | Admitting: Behavioral Health

## 2017-12-27 DIAGNOSIS — I7025 Atherosclerosis of native arteries of other extremities with ulceration: Secondary | ICD-10-CM | POA: Diagnosis not present

## 2017-12-27 DIAGNOSIS — E441 Mild protein-calorie malnutrition: Secondary | ICD-10-CM | POA: Diagnosis not present

## 2017-12-27 DIAGNOSIS — E11621 Type 2 diabetes mellitus with foot ulcer: Secondary | ICD-10-CM | POA: Diagnosis not present

## 2017-12-27 DIAGNOSIS — E1122 Type 2 diabetes mellitus with diabetic chronic kidney disease: Secondary | ICD-10-CM | POA: Diagnosis not present

## 2017-12-27 DIAGNOSIS — M86171 Other acute osteomyelitis, right ankle and foot: Secondary | ICD-10-CM | POA: Diagnosis not present

## 2017-12-27 DIAGNOSIS — L97512 Non-pressure chronic ulcer of other part of right foot with fat layer exposed: Secondary | ICD-10-CM | POA: Diagnosis not present

## 2017-12-27 DIAGNOSIS — I120 Hypertensive chronic kidney disease with stage 5 chronic kidney disease or end stage renal disease: Secondary | ICD-10-CM | POA: Diagnosis not present

## 2017-12-27 DIAGNOSIS — N186 End stage renal disease: Secondary | ICD-10-CM | POA: Diagnosis not present

## 2017-12-27 NOTE — Progress Notes (Signed)
[facility]      Reason for Consult: osteomyelitis    Referring Physician: NP Lawanda Cousins    Patient ID: [name], male    DOB: Oct 16, 1950, 67 y.o.   MRN: [id number]  HPI:   He comes in for evaluation of toe osteomyelitis.  He is sent from the wound center and is diabetic, with ESRD on dialysis and developed a necrotic wound of his developed osteomyelitis of the distal phalanx of the right great toe. He reports his wound is healing well and has decreased in size.  He had superficial swabs with Enterococcus and Klebsiella but no bone biopsy I am aware of.  No associated fever or chills.  He has non-compressible ABIs.   Note reviewed from vascular surgery.  He has limb threatening ischemia but is not interested at this time in angiography and potential intervention.    Past Medical History:  Diagnosis Date  . Cancer ([place])    right kidney  . Diabetes mellitus without complication ([place])   . Dialysis patient [facility])    on Mon-Wed-Fri dialysis  . ESRD (end stage renal disease) ([place])   . History of gangrene   . Hypertension     Prior to Admission medications   Medication Sig Start Date End Date Taking? Authorizing Provider  albuterol (PROVENTIL HFA) 108 (90 Base) MCG/ACT inhaler Inhale into the lungs. 08/02/17   [provider]  ampicillin (PRINCIPEN) 500 MG capsule TK 1 C PO Q 12 H FOR 10 DAYS 12/13/17   [provider]  carvedilol (COREG) 12.5 MG tablet Take 1 tablet (12.5 mg total) by mouth 2 (two) times daily with a meal. Patient not taking: Reported on 12/20/2017 03/22/17   Gladstone Lighter, MD  ciprofloxacin (CIPRO) 500 MG tablet TK 1 T PO D FOR 10 DAYS 12/13/17   [provider]  cyclobenzaprine (FLEXERIL) 10 MG tablet Take 1 tablet (10 mg total) 3 (three) times daily as needed by mouth. Patient not taking: Reported on 12/20/2017 05/23/17   Gregor Hams, MD  docusate sodium (COLACE) 100 MG capsule Take 1 capsule (100 mg  total) 2 (two) times daily by mouth. Patient not taking: Reported on 12/20/2017 05/16/17 05/16/18  Harvest Dark, MD  Ferric Citrate (AURYXIA) 1 GM 210 MG(Fe) TABS Take 3 tablets by mouth 2 (two) times daily with a meal.    [provider]  HYDROcodone-acetaminophen (NORCO/VICODIN) 5-325 MG tablet Take 1 tablet every 4 (four) hours as needed by mouth. Patient not taking: Reported on 12/20/2017 05/16/17   Harvest Dark, MD  LEVEMIR FLEXTOUCH 100 UNIT/ML Pen Inject 6-13 Units into the skin at bedtime.  07/10/15   [provider]  lidocaine (LIDODERM) 5 % Place 1 patch daily onto the skin. Remove & Discard patch within 12 hours or as directed by MD Patient not taking: Reported on 12/20/2017 05/23/17   Gregor Hams, MD  lidocaine-prilocaine (EMLA) cream Apply 1 application topically every Monday, Wednesday, and Friday. Apply 2 hours before dialysis. 05/23/10   [provider]  losartan (COZAAR) 100 MG tablet Take 100 mg by mouth daily. 01/05/17   [provider]  metoCLOPramide (REGLAN) 5 MG tablet Take 1 tablet (5 mg total) by mouth 3 (three) times daily before meals. 03/22/17   Gladstone Lighter, MD  metoprolol tartrate (LOPRESSOR) 25 MG tablet Take 2 tablets 2 (two) times daily by mouth. 04/05/17   [provider]  SANTYL ointment APPLY A NICKEL THICK TO THE  RIGHT DISTAL GREAT TOE DAILY AND COVER WITH DRESSING AS DIRECTED 11/28/17   [provider]  sevelamer carbonate (RENVELA) 800 MG tablet Take 1,600 mg by mouth 2 (two) times daily with a meal.     [provider]  traZODone (DESYREL) 50 MG tablet TK 1 T PO QHS PRN FOR SLEEP. 02/16/16   [provider]  vancomycin (VANCOCIN) 1-5 GM/200ML-% SOLN Inject 200 mLs (1,000 mg total) into the vein every Monday, Wednesday, and Friday with hemodialysis. X 4 weeks Patient not taking: Reported on 12/20/2017 03/23/17   Gladstone Lighter, MD    No Known Allergies  Social History    Tobacco Use  . Smoking status: Former Research scientist (life sciences)  . Smokeless tobacco: Never Used  . Tobacco comment: cigar smoker  Substance Use Topics  . Alcohol use: No  . Drug use: No    Family History  Problem Relation Age of Onset  . Hypertension Father     Review of Systems  Constitutional: negative for fevers, chills and malaise Gastrointestinal: negative for nausea and diarrhea Integument/breast: negative for rash All other systems reviewed and are negative    Constitutional: in no apparent distress  Vitals:   12/27/17 1343  Temp: 97.8 F (36.6 C)   EYES: anicteric ENMT: no thrush Cardiovascular: Cor RRR Respiratory: CTA B; normal respiratory effort GI: Bowel sounds are normal, liver is not enlarged, spleen is not enlarged Musculoskeletal: no pedal edema noted Skin: negatives: no rash Hematologic: no cervical lad  Labs: Lab Results  Component Value Date   WBC 2.8 (L) 05/23/2017   HGB 10.6 (L) 05/23/2017   HCT 33.0 (L) 05/23/2017   MCV 92.3 05/23/2017   PLT 130 (L) 05/23/2017    Lab Results  Component Value Date   CREATININE 10.13 (H) 05/23/2017   BUN 43 (H) 05/23/2017   NA 136 05/23/2017   K 4.1 05/23/2017   CL 95 (L) 05/23/2017   CO2 26 05/23/2017    Lab Results  Component Value Date   ALT 8 (L) 05/23/2017   AST 19 05/23/2017   ALKPHOS 100 05/23/2017   BILITOT 0.2 (L) 05/23/2017   INR 1.43 03/20/2017     Assessment: osteomyelitis.  Organisms noted.  Unsure significance but will treat broadly and include those.  Will treat for 6 weeks.   Plan: 1) vancomycin through July 31 with dialysis 2) cefazolin through July 31 with dialysis If his infection does not resolve with above treatments, no further antibiotic therapy will be indicated or useful, would require evaluation for amputation due to poor vasculature.

## 2017-12-27 NOTE — Telephone Encounter (Signed)
Called Timothy Gibson and informed him orders for antibiotics would be faxed for Timothy Gibson.  Patient is to receive Vancomycin 2 grams loading dose times 1 dose and then Vancomycin 1 gram IV for 6 weeks, and Cefazolin 2 grams IV for 6 weeks.  End date will be February 06, 2018.  Timothy Busman RN verbalized understanding and read back orders.  Orders were faxed at 4:05pm. Pricilla Riffle RN

## 2017-12-27 NOTE — Patient Instructions (Signed)
You will be on vancomycin and cefazolin for 6 weeks with each dose after dialysis through July 31.

## 2017-12-28 DIAGNOSIS — D631 Anemia in chronic kidney disease: Secondary | ICD-10-CM | POA: Diagnosis not present

## 2017-12-28 DIAGNOSIS — Z992 Dependence on renal dialysis: Secondary | ICD-10-CM | POA: Diagnosis not present

## 2017-12-28 DIAGNOSIS — N2581 Secondary hyperparathyroidism of renal origin: Secondary | ICD-10-CM | POA: Diagnosis not present

## 2017-12-28 DIAGNOSIS — M86171 Other acute osteomyelitis, right ankle and foot: Secondary | ICD-10-CM | POA: Diagnosis not present

## 2017-12-28 DIAGNOSIS — D509 Iron deficiency anemia, unspecified: Secondary | ICD-10-CM | POA: Diagnosis not present

## 2017-12-28 DIAGNOSIS — N186 End stage renal disease: Secondary | ICD-10-CM | POA: Diagnosis not present

## 2017-12-28 NOTE — Progress Notes (Addendum)
Timothy Gibson, Timothy Gibson ([id number]) Visit Report for 12/27/2017 Chief Complaint Document Details Patient Name: Timothy Gibson, Timothy Gibson Date of Service: 12/27/2017 9:30 AM Medical Record Number: [id number] Patient Account Number: [id number] Date of Birth/Sex: [birth year] (67 y.o. M) Treating RN: Primary Care Provider: PATIENT, NO Other Clinician: Referring Provider: Konrad Felix Treating Provider/Extender: Cathie Olden in Treatment: 4 Information Obtained from: Patient Chief Complaint Right Great Toe Electronic Signature(s) Signed: 12/27/2017 10:21:42 AM By: Lawanda Cousins Entered By: Lawanda Cousins on 12/27/2017 10:21:42 Timothy Gibson ([id number]) -------------------------------------------------------------------------------- Debridement Details Patient Name: Timothy Gibson Date of Service: 12/27/2017 9:30 AM Medical Record Number: [id number] Patient Account Number: [id number] Date of Birth/Sex: 12-18-1950 (67 y.o. M) Treating RN: Primary Care Provider: PATIENT, NO Other Clinician: Referring Provider: Konrad Felix Treating Provider/Extender: Cathie Olden in Treatment: 4 Debridement Performed for Wound #1 Right Toe Great Assessment: Performed By: Physician Lawanda Cousins, NP Debridement Type: Debridement Severity of Tissue Pre Fat layer exposed Debridement: Pre-procedure Verification/Time Yes - 09:57 Out Taken: Start Time: 09:57 Pain Control: Lidocaine 4% Topical Solution Total Area Debrided (L x W): 0.7 (cm) x 1.1 (cm) = 0.77 (cm) Tissue and other material Viable, Non-Viable, Callus, Slough, Subcutaneous, Fibrin/Exudate, Slough debrided: Level: Skin/Subcutaneous Tissue Debridement Description: Excisional Instrument: Curette Bleeding: Minimum Hemostasis Achieved: Pressure End Time: 10:05 Procedural Pain: 0 Post Procedural Pain: 0 Response to Treatment: Procedure was tolerated well Level of Consciousness: Awake and Alert Post Procedure  Vitals: Temperature: 97.8 Pulse: 78 Respiratory Rate: 18 Blood Pressure: Systolic Blood Pressure: 762 Diastolic Blood Pressure: 76 Post Debridement Measurements of Total Wound Length: (cm) 0.7 Width: (cm) 1.1 Depth: (cm) 0.2 Volume: (cm) 0.121 Character of Wound/Ulcer Post Debridement: Requires Further Debridement Severity of Tissue Post Debridement: Fat layer exposed Post Procedure Diagnosis Same as Pre-procedure Electronic Signature(s) Signed: 12/27/2017 10:21:00 AM By: Ladean Raya ([id number]) Entered By: Lawanda Cousins on 12/27/2017 10:21:00 Timothy Gibson, Timothy Gibson ([id number]) -------------------------------------------------------------------------------- HPI Details Patient Name: Timothy Gibson Date of Service: 12/27/2017 9:30 AM Medical Record Number: [id number] Patient Account Number: [id number] Date of Birth/Sex: 1951/03/31 (67 y.o. M) Treating RN: Primary Care Provider: PATIENT, NO Other Clinician: Referring Provider: Konrad Felix Treating Provider/Extender: Cathie Olden in Treatment: 4 History of Present Illness HPI Description: 11/27/17 on evaluation today patient presents for initial evaluation concerning an issue that he has been having with his right great toe which began last Thursday 11/22/17. He has a history of diabetes mellitus type to which he has had for greater than 30 years currently he is on insulin. Subsequently he also has a history of hypertension. On physical exam inspection is also appears he may have some peripheral vascular disease with his ABI's being noncompressible bilaterally. He is on dialysis as well and this is on Monday, Wednesday, and Friday. Patient was hospitalized back in January/February 2019 although this was more for stomach/back pain though they never told me exactly what was going on. This is according to the patient. Subsequently the ulcer which is between the third and fourth toe when space of the right  foot as well is on the plantar aspect of the fourth toe is due to him having cleaned his toes this morning and he states that his finger which is large split the toe tissue in between causing the wounds that we currently see. With that being said he states this has happened before I explained I would definitely recommend that he not do this any longer. He can use a small soft washcloth to clean between the  toes without causing trauma. Subsequently the right great toe hatchery does show evidence of necrotic tissue present on the surface of the wound specifically there is callous and dead skin surrounding which is trapping fluid causing problems as far as the wound is concerned. The surface of the wound does show slough although due to his vascular flow I'm not going to sharply debride this today I think I will selectively debride away the necrotic skin as well is callous from around the surrounding so this will not continue to be a moisture issue. Nonetheless the patient has no pain he does have diabetic neuropathy. 12/06/17-He is here in follow-up evaluation for a right great toe ulcer. He is voicing no complaints or concerns. He continues to infrequently/socially smokes cigars with no desire to quit. He has been compliant with offloading, using open toed surgical shoe; has been compliant using Santyl daily. He continues on clindamycin although takes it inconsistently secondary to indigestion. The plain film x-ray performed on 5/23 impression: Soft tissue swelling of the left first toe and is noted with probable irregular lucency involving distal tuft of first distal phalanx concerning for osteomyelitis. MRI may be performed for further evaluation; MRI ordered. The vascular evaluation performed on 5/24 field non-compressible ABIs bilaterally and reduced TBI bilaterally suggesting significant tibial disease. He will be referred to vascular medicine for further evaluation. 12/13/17-He is here in  follow-up evaluation for right great toe ulcer. He has appointment next Thursday for the MRI and vascular evaluation. Wound culture obtained last week grew abundant Klebsiella oxytoca (multidrug sensitivity), and abundant enteric coccus faecalis (sensitive to ampicillin). Ampicillin and Cipro were called in, along with a probiotic. In light of his appointments next Thursday he will follow-up in 2 weeks, we will continue with Santyl 12/27/17-He is here for evaluation for a right great toe ulcer. 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 He did have evaluation by vascular medicine, Dr. Ronalee Belts, on 6/13, at that appointment it was decided for him to undergo angiography with possible intervention but he refused and is still considering. If he chooses to have it done he will contact the office. He has not heard back from either ID offices that he was referred to two weeks ago: [place] and [place]. there is improvement in both appearance and measurement to the ulcer. We will extend antibiotic therapy (amoxicillin 500 every 12, Cipro 500 daily) for additional 2 weeks pending infectious disease consult, he will continue with Santyl daily. He was reminded to continue with probiotic therapy while on oral antibiotics; he denies any GI disturbance. Electronic Signature(s) Signed: 12/27/2017 10:28:15 AM By: Lawanda Cousins Previous Signature: 12/27/2017 10:27:31 AM Version By: Lawanda Cousins Previous Signature: 12/27/2017 10:23:54 AM Version By: Lawanda Cousins Previous Signature: 12/27/2017 8:06:02 AM Version By: Lawanda Cousins Entered By: Lawanda Cousins on 12/27/2017 10:28:15 Timothy Gibson ([id number]) -------------------------------------------------------------------------------- Physician Orders Details Patient Name: Timothy Gibson Date of Service: 12/27/2017 9:30 AM Medical Record Number: [id number] Patient Account Number:  [id number] Date of Birth/Sex: 08-15-1950 (67 y.o. M) Treating RN: Ahmed Prima Primary Care Provider: PATIENT, NO Other Clinician: Referring Provider: Konrad Felix Treating Provider/Extender: Cathie Olden in Treatment: 4 Verbal / Phone Orders: Yes Clinician: Carolyne Fiscal, Debi Read Back and Verified: Yes Diagnosis Coding Wound Cleansing Wound #1 Right Toe Great o Cleanse wound with mild soap and water Anesthetic (add to Medication List) Wound #1 Right Toe Great o Topical Lidocaine 4% cream applied  to wound bed prior to debridement (In Clinic Only). Primary Wound Dressing Wound #1 Right Toe Great o Saline moistened gauze - only dampened o Santyl Ointment Secondary Dressing Wound #1 Right Toe Great o Gauze and Kerlix/Conform Dressing Change Frequency Wound #1 Right Toe Great o Change dressing every day. Follow-up Appointments Wound #1 Right Toe Great o Return Appointment in 1 week. Edema Control Wound #1 Right Toe Great o Elevate legs to the level of the heart and pump ankles as often as possible Off-Loading Wound #1 Right Toe Great o Open toe surgical shoe to: Additional Orders / Instructions Wound #1 Right Toe Great o Increase protein intake. Patient Medications Allergies: no known allergies Timothy Gibson, Timothy Gibson ([id number]) Notifications Medication Indication Start End lidocaine DOSE 1 - topical 4 % cream - 1 cream topical Santyl 12/28/2017 DOSE topical 250 unit/gram ointment - ointment topical; apply to wound daily Cipro 12/27/2017 DOSE oral 500 mg tablet - tablet oral; one tablet daily for 14 days ampicillin 12/27/2017 DOSE oral 500 mg capsule - capsule oral; one tablet every 12 hours for 14 days Electronic Signature(s) Signed: 12/27/2017 10:14:43 AM By: Lawanda Cousins Entered By: Lawanda Cousins on 12/27/2017 10:14:42 Timothy Gibson  ([id number]) -------------------------------------------------------------------------------- Prescription 12/27/2017 Patient Name: Timothy Gibson Provider: Lawanda Cousins NP Date of Birth: 09/28/1950 NPI#: [id number] Sex: M DEA#: [id number] Phone #: [phone] License #: Patient Address: [facility] [place], [place] [address] [address] [place], [place] [address] [phone] Allergies no known allergies Medication Medication: Route: Strength: Form: lidocaine 4 % topical cream topical 4% cream Class: TOPICAL LOCAL ANESTHETICS Dose: Frequency / Time: Indication: 1 1 cream topical Number of Refills: Number of Units: 0 Generic Substitution: Start Date: End Date: One Time Use: Substitution Permitted No Note to Pharmacy: Signature(s): Date(s): Electronic Signature(s) Signed: 12/27/2017 5:32:34 PM By: Lawanda Cousins Entered By: Lawanda Cousins on 12/27/2017 10:14:44 Timothy Gibson ([id number]) --------------------------------------------------------------------------------  Problem List Details Patient Name: Timothy Gibson Date of Service: 12/27/2017 9:30 AM Medical Record Number: [id number] Patient Account Number: [id number] Date of Birth/Sex: [birth year] (67 y.o. 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/27/2017 No Yes layer exposed I10 Essential (primary) hypertension 11/27/2017 No Yes E44.1 Mild  protein-calorie malnutrition 11/27/2017 No Yes M86.671 Other chronic osteomyelitis, right ankle and foot 12/27/2017 No Yes Inactive Problems Resolved Problems Electronic Signature(s) Signed: 12/27/2017 10:19:29 AM By: Lawanda Cousins Entered By: Lawanda Cousins on 12/27/2017 10:19:28 Timothy Gibson ([id number]) -------------------------------------------------------------------------------- Progress Note Details Patient Name: Timothy Gibson Date of Service: 12/27/2017 9:30 AM Medical Record Number: [id number] Patient Account Number: [id number] Date of Birth/Sex: May 02, 1951 (67 y.o. M) Treating RN: Primary Care Provider: PATIENT, NO Other Clinician: Referring Provider: Konrad Felix Treating Provider/Extender: Cathie Olden in Treatment: 4 Subjective Chief Complaint Information obtained from Patient Right Great Toe History of Present Illness (HPI) 11/27/17 on evaluation today patient presents for initial evaluation concerning an issue that he has been having with his right great toe which began last Thursday 11/22/17. He has a history of diabetes mellitus type to which he  has had for greater than 30 years currently he is on insulin. Subsequently he also has a history of hypertension. On physical exam inspection is also appears he may have some peripheral vascular disease with his ABI's being noncompressible bilaterally. He is on dialysis as well and this is on Monday, Wednesday, and Friday. Patient was hospitalized back in January/February 2019 although this was more for stomach/back pain though they never told me exactly what was going on. This is according to the patient. Subsequently the ulcer which is between the third and fourth toe when space of the right foot as well is on the plantar aspect of the fourth toe is due to him having cleaned his toes this morning and he states that his finger which is large split the toe tissue in between causing the wounds that we currently see.  With that being said he states this has happened before I explained I would definitely recommend that he not do this any longer. He can use a small soft washcloth to clean between the toes without causing trauma. Subsequently the right great toe hatchery does show evidence of necrotic tissue present on the surface of the wound specifically there is callous and dead skin surrounding which is trapping fluid causing problems as far as the wound is concerned. The surface of the wound does show slough although due to his vascular flow I'm not going to sharply debride this today I think I will selectively debride away the necrotic skin as well is callous from around the surrounding so this will not continue to be a moisture issue. Nonetheless the patient has no pain he does have diabetic neuropathy. 12/06/17-He is here in follow-up evaluation for a right great toe ulcer. He is voicing no complaints or concerns. He continues to infrequently/socially smokes cigars with no desire to quit. He has been compliant with offloading, using open toed surgical shoe; has been compliant using Santyl daily. He continues on clindamycin although takes it inconsistently secondary to indigestion. The plain film x-ray performed on 5/23 impression: Soft tissue swelling of the left first toe and is noted with probable irregular lucency involving distal tuft of first distal phalanx concerning for osteomyelitis. MRI may be performed for further evaluation; MRI ordered. The vascular evaluation performed on 5/24 field non-compressible ABIs bilaterally and reduced TBI bilaterally suggesting significant tibial disease. He will be referred to vascular medicine for further evaluation. 12/13/17-He is here in follow-up evaluation for right great toe ulcer. He has appointment next Thursday for the MRI and vascular evaluation. Wound culture obtained last week grew abundant Klebsiella oxytoca (multidrug sensitivity), and abundant  enteric coccus faecalis (sensitive to ampicillin). Ampicillin and Cipro were called in, along with a probiotic. In light of his appointments next Thursday he will follow-up in 2 weeks, we will continue with Santyl 12/27/17-He is here for evaluation for a right great toe ulcer. 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 He did have evaluation by vascular medicine, Dr. Ronalee Belts, on 6/13, at that appointment it was decided for him to undergo angiography with possible intervention but he refused and is still considering. If he chooses to have it done he will contact the office. He has not heard back from either ID offices that he was referred to two weeks ago: [place] and [place]. there is improvement in both appearance and measurement to the ulcer. We will extend antibiotic therapy (amoxicillin 500 every 12, Cipro 500  daily) for additional 2 weeks pending infectious disease consult, he will continue with Santyl daily. He was reminded to continue with probiotic therapy while on oral antibiotics; he denies any GI disturbance. Patient History Information obtained from Patient. Family History Timothy Gibson, Timothy Gibson ([id number]) Cancer - Mother,Father, Diabetes - Mother, Heart Disease - Mother, Hypertension - Mother,Father, Stroke - Father, Thyroid Problems - Mother, No family history of Kidney Disease, Lung Disease, Seizures, Tuberculosis. Social History Former smoker - cigar about 4 to 5 days, Marital Status - Divorced, Alcohol Use - Never, Drug Use - No History, Caffeine Use - Daily. Medical History Hospitalization/Surgery History - 08/11/2017, [facility], stomach and back pain. Review of Systems (ROS) Constitutional Symptoms (General Health) Denies complaints or symptoms of Fatigue, Fever, Chills. Cardiovascular Denies complaints or symptoms of LE edema. Objective Constitutional Vitals Time Taken: 9:38 AM, Height: 76 in,  Weight: 220 lbs, BMI: 26.8, Temperature: 97.8 F, Pulse: 78 bpm, Respiratory Rate: 16 breaths/min, Blood Pressure: 188/76 mmHg. Integumentary (Hair, Skin) Wound #1 status is Open. Original cause of wound was Gradually Appeared. The wound is located on the Right Toe Great. The wound measures 0.7cm length x 1.1cm width x 0.1cm depth; 0.605cm^2 area and 0.06cm^3 volume. There is Fat Layer (Subcutaneous Tissue) Exposed exposed. There is no tunneling or undermining noted. There is a medium amount of serosanguineous drainage noted. The wound margin is flat and intact. There is medium (34-66%) pink granulation within the wound bed. There is a medium (34-66%) amount of necrotic tissue within the wound bed including Adherent Slough. The periwound skin appearance exhibited: Callus. The periwound skin appearance did not exhibit: Crepitus, Excoriation, Induration, Rash, Scarring, Dry/Scaly, Maceration, Atrophie Blanche, Cyanosis, Ecchymosis, Hemosiderin Staining, Mottled, Pallor, Rubor, Erythema. Periwound temperature was noted as No Abnormality. 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 There was a Excisional Skin/Subcutaneous Tissue Debridement with a total area of 0.77 sq cm performed by Lawanda Cousins, NP. With the following instrument(s): Curette to remove Viable and Non-Viable tissue/material. Material removed includes Callus, Subcutaneous Tissue, Slough,  and Fibrin/Exudate after achieving pain control using Lidocaine 4% Topical Solution. No specimens were taken. A time out was conducted at 09:57, prior to the start of the procedure. A Minimum amount of bleeding was controlled with Pressure. The procedure was tolerated well with a pain level of 0 throughout and a pain level of 0 following the procedure. Patient s Level of Consciousness post procedure was recorded as Awake and Alert. Post Debridement Measurements: 0.7cm length x 1.1cm width x 0.2cm depth; 0.121cm^3 volume. Character of Wound/Ulcer Post Debridement requires further debridement. Severity of Tissue Post Debridement is: Fat layer exposed. Post procedure Diagnosis Wound #1: Same as Pre-Procedure Plan Wound Cleansing: Wound #1 Right Toe Great: Cleanse wound with mild soap and water Anesthetic (add to Medication List): Wound #1 Right Toe Great: Topical Lidocaine 4% cream applied to wound bed prior to debridement (In Clinic Only). Primary Wound Dressing: Wound #1 Right Toe Great: Saline moistened gauze - only dampened Santyl Ointment Secondary Dressing: Wound #1 Right Toe Great: Gauze and  Kerlix/Conform Dressing Change Frequency: Wound #1 Right Toe Great: Change dressing every day. Follow-up Appointments: Wound #1 Right Toe Great: Return Appointment in 1 week. Edema Control: Wound #1 Right Toe Great: Elevate legs to the level of the heart and pump ankles as often as possible Off-Loading: Wound #1 Right Toe Great: Open toe surgical shoe to: Additional Orders / Instructions: Wound #1 Right Toe Great: Increase protein intake. Timothy Gibson, Timothy Gibson ([id number]) The following medication(s) was prescribed: lidocaine topical 4 % cream 1 1 cream topical was prescribed at facility Santyl topical 250 unit/gram ointment ointment topical; apply to wound daily starting 12/28/2017 Cipro oral 500 mg tablet tablet oral; one tablet daily for 14 days starting 12/27/2017 ampicillin oral 500  mg capsule capsule oral; one tablet every 12 hours for 14 days starting 12/27/2017 Electronic Signature(s) Signed: 12/27/2017 10:29:01 AM By: Lawanda Cousins Entered By: Lawanda Cousins on 12/27/2017 10:29:01 Timothy Gibson ([id number]) -------------------------------------------------------------------------------- ROS/PFSH Details Patient Name: Timothy Gibson Date of Service: 12/27/2017 9:30 AM Medical Record Number: [id number] Patient Account Number: [id number] Date of Birth/Sex: April 06, 1951 (67 y.o. M) Treating RN: Primary Care Provider: PATIENT, NO Other Clinician: Referring Provider: Konrad Felix Treating Provider/Extender: Cathie Olden in Treatment: 4 Information Obtained From Patient Wound History Constitutional Symptoms (General Health) Complaints and Symptoms: Negative for: Fatigue; Fever; Chills Cardiovascular Complaints and Symptoms: Negative for: LE edema Medical History: Positive for: Arrhythmia; Hypertension Negative for: Congestive Heart Failure; Coronary Artery Disease; Deep Vein Thrombosis; Hypotension; Myocardial Infarction; Peripheral Arterial Disease; Peripheral Venous Disease; Phlebitis; Vasculitis Eyes Medical History: Negative for: Cataracts; Glaucoma; Optic Neuritis Ear/Nose/Mouth/Throat Medical History: Negative for: Chronic sinus problems/congestion; Middle ear problems Hematologic/Lymphatic Medical History: Positive for: Lymphedema Negative for: Anemia; Hemophilia; Human Immunodeficiency Virus; Sickle Cell Disease Respiratory Medical History: Negative for: Aspiration; Asthma; Chronic Obstructive Pulmonary Disease (COPD); Pneumothorax; Sleep Apnea; Tuberculosis Gastrointestinal Medical History: Negative for: Cirrhosis ; Colitis; Crohnos; Hepatitis A; Hepatitis B; Hepatitis C Endocrine Medical History: Positive for: Type II Diabetes Timothy Gibson, Timothy Gibson ([id number]) Time with diabetes: 30 years Treated with: Diet Blood sugar tested  every day: No Genitourinary Medical History: Positive for: End Stage Renal Disease Immunological Medical History: Negative for: Lupus Erythematosus; Raynaudos Musculoskeletal Medical History: Positive for: Rheumatoid Arthritis; Osteoarthritis Negative for: Gout; Osteomyelitis Neurologic Medical History: Negative for: Dementia; Neuropathy; Quadriplegia; Paraplegia; Seizure Disorder Oncologic Medical History: Negative for: Received Chemotherapy; Received Radiation Psychiatric Medical History: Positive for: Confinement Anxiety Negative for: Anorexia/bulimia Immunizations Pneumococcal Vaccine: Received Pneumococcal Vaccination: Yes Implantable Devices Hospitalization / Surgery History Name of Hospital Purpose of Hospitalization/Surgery Date [facility] stomach and back pain 08/11/2017 Family and Social History Cancer: Yes - Mother,Father; Diabetes: Yes - Mother; Heart Disease: Yes - Mother; Hypertension: Yes - Mother,Father; Kidney Disease: No; Lung Disease: No; Seizures: No; Stroke: Yes - Father; Thyroid Problems: Yes - Mother; Tuberculosis: No; Former smoker - cigar about 4 to 5 days; Marital Status - Divorced; Alcohol Use: Never; Drug Use: No History; Caffeine Use: Daily; Financial Concerns: No; Food, Clothing or Shelter Needs: No; Support System Lacking: No; Transportation Concerns: No; Advanced Directives: Yes (Not Provided); Patient does not want information on Advanced Directives; Do not resuscitate: No; Living Will: Yes (Not Provided); Medical Power of Attorney: Yes (Not Provided) Physician Affirmation I have reviewed and agree with the above information. Electronic Signature(s) Timothy Gibson, Timothy Gibson ([id number]) Signed: 12/27/2017 5:32:34 PM By: Lawanda Cousins Entered By: Lawanda Cousins on 12/27/2017 10:28:40 Timothy Gibson, Timothy Gibson ([id number]) -------------------------------------------------------------------------------- [place] Details Patient Name: Timothy Gibson Date of  Service: 12/27/2017 Medical Record Number:  [id number] Patient Account Number: [id number] Date of Birth/Sex: 1951/06/08 (67 y.o. 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, right ankle and foot Facility Procedures CPT4 Code: 09628366 Description: 29476 - DEB SUBQ TISSUE 20 SQ CM/< ICD-10 Diagnosis Description L97.512 Non-pressure chronic ulcer of other part of right foot with fat l Modifier: ayer exposed Quantity: 1 Physician Procedures CPT4 Code: 5465035 Description: 11042 - WC PHYS SUBQ TISS 20 SQ CM ICD-10 Diagnosis Description L97.512 Non-pressure chronic ulcer of other part of right foot with fat l Modifier: ayer exposed Quantity: 1 Electronic Signature(s) Signed: 12/27/2017 10:29:15 AM By: Lawanda Cousins Entered By: Lawanda Cousins on 12/27/2017 10:29:15

## 2017-12-29 NOTE — Progress Notes (Signed)
[name], [name] ([id number]) Visit Report for 12/27/2017 Arrival Information Details Patient Name: Timothy Gibson, Timothy Gibson Date of Service: 12/27/2017 9:30 AM Medical Record Number: [id number] Patient Account Number: [id number] Date of Birth/Sex: Jul 03, 1951 (66 y.o. M) Treating RN: Montey Hora Primary Care Keijuan Schellhase: PATIENT, NO Other Clinician: Referring Virlan Kempker: Konrad Felix Treating Jordis Repetto/Extender: Cathie Olden in Treatment: 4 Visit Information History Since Last Visit Added or deleted any medications: No Patient Arrived: Ambulatory Any new allergies or adverse reactions: No Arrival Time: 09:33 Had a fall or experienced change in No Accompanied By: caregiver activities of daily living that may affect Transfer Assistance: None risk of falls: Patient Identification Verified: Yes Signs or symptoms of abuse/neglect since last visito No Secondary Verification Process Completed: Yes Hospitalized since last visit: No Patient Requires Transmission-Based No Implantable device outside of the clinic excluding No Precautions: cellular tissue based products placed in the center Patient Has Alerts: No since last visit: Has Dressing in Place as Prescribed: Yes Pain Present Now: No Electronic Signature(s) Signed: 12/27/2017 10:39:50 AM By: Montey Hora Entered By: Montey Hora on 12/27/2017 09:35:30 Timothy Gibson ([id number]) -------------------------------------------------------------------------------- Encounter Discharge Information Details Patient Name: Timothy Gibson Date of Service: 12/27/2017 9:30 AM Medical Record Number: [id number] Patient Account Number: [id number] Date of Birth/Sex: January 20, 1951 (67 y.o. M) Treating RN: Roger Shelter Primary Care Caycee Wanat: PATIENT, NO Other Clinician: Referring Cipriana Biller: Konrad Felix Treating Malic Rosten/Extender: Cathie Olden in Treatment: 4 Encounter Discharge Information Items Discharge Condition:  Stable Ambulatory Status: Ambulatory Discharge Destination: Home Transportation: Private Auto Schedule Follow-up Appointment: Yes Clinical Summary of Care: Electronic Signature(s) Signed: 12/27/2017 10:41:08 AM By: Roger Shelter Entered By: Roger Shelter on 12/27/2017 10:19:44 Timothy Gibson ([id number]) -------------------------------------------------------------------------------- Lower Extremity Assessment Details Patient Name: Timothy Gibson Date of Service: 12/27/2017 9:30 AM Medical Record Number: [id number] Patient Account Number: [id number] Date of Birth/Sex: 1951/03/22 (67 y.o. M) Treating RN: Montey Hora Primary Care Nyra Anspaugh: PATIENT, NO Other Clinician: Referring Accalia Rigdon: Konrad Felix Treating Urania Pearlman/Extender: Cathie Olden in Treatment: 4 Vascular Assessment Pulses: Dorsalis Pedis Palpable: [Right:Yes] Posterior Tibial Extremity colors, hair growth, and conditions: Extremity Color: [Right:Hyperpigmented] Hair Growth on Extremity: [Right:No] Temperature of Extremity: [Right:Warm] Capillary Refill: [Right:< 3 seconds] Toe Nail Assessment Left: Right: Thick: Yes Discolored: Yes Deformed: Yes Improper Length and Hygiene: No Electronic Signature(s) Signed: 12/27/2017 10:39:50 AM By: Montey Hora Entered By: Montey Hora on 12/27/2017 09:43:21 Timothy Gibson ([id number]) -------------------------------------------------------------------------------- Multi Wound Chart Details Patient Name: Timothy Gibson Date of Service: 12/27/2017 9:30 AM Medical Record Number: [id number] Patient Account Number: [id number] Date of Birth/Sex: 09-09-1950 (67 y.o. M) Treating RN: Ahmed Prima Primary Care Arhan Mcmanamon: PATIENT, NO Other Clinician: Referring Lennard Capek: Konrad Felix Treating Roxie Kreeger/Extender: Cathie Olden in Treatment: 4 Vital Signs Height(in): 86 Pulse(bpm): 67 Weight(lbs): 220 Blood Pressure(mmHg): 188/76 Body Mass  Index(BMI): 27 Temperature(F): 97.8 Respiratory Rate 16 (breaths/min): Photos: [1:No Photos] [N/A:N/A] Wound Location: [1:Right Toe Great] [N/A:N/A] Wounding Event: [1:Gradually Appeared] [N/A:N/A] Primary Etiology: [1:Diabetic Wound/Ulcer of the Lower Extremity] [N/A:N/A] Comorbid History: [1:Lymphedema, Arrhythmia, Hypertension, Type II Diabetes, End Stage Renal Disease, Rheumatoid Arthritis, Osteoarthritis, Confinement Anxiety] [N/A:N/A] Date Acquired: [1:11/22/2017] [N/A:N/A] Weeks of Treatment: [1:4] [N/A:N/A] Wound Status: [1:Open] [N/A:N/A] Measurements L x W x D [1:0.7x1.1x0.1] [N/A:N/A] (cm) Area (cm) : [1:0.605] [N/A:N/A] Volume (cm) : [1:0.06] [N/A:N/A] % Reduction in Area: [1:74.30%] [N/A:N/A] % Reduction in Volume: [1:74.60%] [N/A:N/A] Classification: [1:Unable to visualize wound bed N/A] Exudate Amount: [1:Medium] [N/A:N/A] Exudate Type: [1:Serosanguineous] [N/A:N/A] Exudate Color: [1:red, brown] [N/A:N/A] Wound Margin: [1:Flat and Intact] [N/A:N/A]  Granulation Amount: [1:Medium (34-66%)] [N/A:N/A] Granulation Quality: [1:Pink] [N/A:N/A] Necrotic Amount: [1:Medium (34-66%)] [N/A:N/A] Exposed Structures: [1:Fat Layer (Subcutaneous Tissue) Exposed: Yes Fascia: No Tendon: No Muscle: No Joint: No Bone: No] [N/A:N/A] Epithelialization: [1:Small (1-33%)] [N/A:N/A] Debridement: [1:Debridement - Excisional] [N/A:N/A] Pre-procedure 09:57 N/A N/A Verification/Time Out Taken: Pain Control: Lidocaine 4% Topical Solution N/A N/A Tissue Debrided: Callus, Subcutaneous, Slough N/A N/A Level: Skin/Subcutaneous Tissue N/A N/A Debridement Area (sq cm): 0.77 N/A N/A Instrument: Curette N/A N/A Bleeding: Minimum N/A N/A Hemostasis Achieved: Pressure N/A N/A Procedural Pain: 0 N/A N/A Post Procedural Pain: 0 N/A N/A Debridement Treatment Procedure was tolerated well N/A N/A Response: Post Debridement 0.7x1.1x0.2 N/A N/A Measurements L x W x D (cm) Post Debridement Volume:  0.121 N/A N/A (cm) Periwound Skin Texture: Callus: Yes N/A N/A Excoriation: No Induration: No Crepitus: No Rash: No Scarring: No Periwound Skin Moisture: Maceration: No N/A N/A Dry/Scaly: No Periwound Skin Color: Atrophie Blanche: No N/A N/A Cyanosis: No Ecchymosis: No Erythema: No Hemosiderin Staining: No Mottled: No Pallor: No Rubor: No Temperature: No Abnormality N/A N/A Tenderness on Palpation: No N/A N/A Wound Preparation: Ulcer Cleansing: N/A N/A Rinsed/Irrigated with Saline Topical Anesthetic Applied: Other: lidocaine 4% Procedures Performed: Debridement N/A N/A Treatment Notes Wound #1 (Right Toe Great) 1. Cleansed with: Clean wound with Normal Saline 2. Anesthetic Topical Lidocaine 4% cream to wound bed prior to debridement 4. Dressing Applied: Santyl Ointment Notes saline wet gauze, cover with dry gauze wrap with conform and stretch net Timothy Gibson, Timothy Gibson ([id number]) Electronic Signature(s) Signed: 12/27/2017 10:19:37 AM By: Lawanda Cousins Entered By: Lawanda Cousins on 12/27/2017 10:19:36 Timothy Gibson, Timothy Gibson ([id number]) -------------------------------------------------------------------------------- [place] Details Patient Name: Timothy Gibson Date of Service: 12/27/2017 9:30 AM Medical Record Number: [id number] Patient Account Number: [id number] Date of Birth/Sex: [birth year] (67 y.o. M) Treating RN: Ahmed Prima Primary Care Orby Tangen: PATIENT, NO Other Clinician: Referring Feather Berrie: Konrad Felix Treating Averleigh Savary/Extender: Cathie Olden in Treatment: 4 Active Inactive ` Abuse / Safety / Falls / Self Care Management Nursing Diagnoses: Potential for falls Goals: Patient will remain injury free related to falls Date Initiated: 11/27/2017 Target Resolution Date: 12/14/2017 Goal Status: Active Interventions: Assess fall risk on admission and as needed Notes: ` Nutrition Nursing Diagnoses: Impaired glucose  control: actual or potential Goals: Patient/caregiver verbalizes understanding of need to maintain therapeutic glucose control per primary care physician Date Initiated: 11/27/2017 Target Resolution Date: 12/28/2017 Goal Status: Active Interventions: Provide education on elevated blood sugars and impact on wound healing Notes: ` Orientation to the Wound Care Program Nursing Diagnoses: Knowledge deficit related to the wound healing center program Goals: Patient/caregiver will verbalize understanding of the [facility] Date Initiated: 11/27/2017 Target Resolution Date: 12/28/2017 Goal Status: Active Interventions: Timothy Gibson, Timothy Gibson ([id number]) Provide education on orientation to the wound center Notes: ` Soft Tissue Infection Nursing Diagnoses: Potential for infection: soft tissue Goals: Patient will remain free of wound infection Date Initiated: 11/27/2017 Target Resolution Date: 12/28/2017 Goal Status: Active Interventions: Assess signs and symptoms of infection every visit Treatment Activities: Systemic antibiotics : 11/27/2017 Notes: ` Wound/Skin Impairment Nursing Diagnoses: Impaired tissue integrity Goals: Ulcer/skin breakdown will have a volume reduction of 80% by week 12 Date Initiated: 11/27/2017 Target Resolution Date: 02/27/2018 Goal Status: Active Interventions: Assess patient/caregiver ability to perform ulcer/skin care regimen upon admission and as needed Treatment Activities: Referred to DME Lashika Erker for dressing supplies : 11/27/2017 Skin care regimen initiated : 11/27/2017 Notes: Electronic Signature(s) Signed: 12/27/2017 4:29:22 PM By: Alric Quan Entered By:  Alric Quan on 12/27/2017 09:55:15 Timothy Gibson, Timothy Gibson ([id number]) -------------------------------------------------------------------------------- Pain Assessment Details Patient Name: Timothy Gibson, Timothy Gibson Date of Service: 12/27/2017 9:30 AM Medical Record Number:  [id number] Patient Account Number: [id number] Date of Birth/Sex: 05-22-1951 (67 y.o. M) Treating RN: Montey Hora Primary Care Brevyn Ring: PATIENT, NO Other Clinician: Referring Terrelle Ruffolo: Konrad Felix Treating Cyrena Kuchenbecker/Extender: Cathie Olden in Treatment: 4 Active Problems Location of Pain Severity and Description of Pain Patient Has Paino No Site Locations Pain Management and Medication Current Pain Management: Electronic Signature(s) Signed: 12/27/2017 10:39:50 AM By: Montey Hora Entered By: Montey Hora on 12/27/2017 09:38:19 Timothy Gibson ([id number]) -------------------------------------------------------------------------------- Patient/Caregiver Education Details Patient Name: Timothy Gibson Date of Service: 12/27/2017 9:30 AM Medical Record Number: [id number] Patient Account Number: [id number] Date of Birth/Gender: 1950/12/18 (67 y.o. M) Treating RN: Roger Shelter Primary Care Physician: PATIENT, NO Other Clinician: Referring Physician: Konrad Felix Treating Physician/Extender: Cathie Olden in Treatment: 4 Education Assessment Education Provided To: Patient Education Topics Provided Wound Debridement: Handouts: Wound Debridement Methods: Explain/Verbal Responses: State content correctly Wound/Skin Impairment: Handouts: Caring for Your Ulcer Methods: Explain/Verbal Responses: State content correctly Electronic Signature(s) Signed: 12/27/2017 10:41:08 AM By: Roger Shelter Entered By: Roger Shelter on 12/27/2017 10:20:00 Timothy Gibson ([id number]) -------------------------------------------------------------------------------- Wound Assessment Details Patient Name: Timothy Gibson Date of Service: 12/27/2017 9:30 AM Medical Record Number: [id number] Patient Account Number: [id number] Date of Birth/Sex: Sep 30, 1950 (67 y.o. M) Treating RN: Montey Hora Primary Care Shyenne Maggard: PATIENT, NO Other Clinician: Referring Donice Alperin:  Konrad Felix Treating Phelix Fudala/Extender: Cathie Olden in Treatment: 4 Wound Status Wound Number: 1 Primary Diabetic Wound/Ulcer of the Lower Extremity Etiology: Wound Location: Right Toe Great Wound Open Wounding Event: Gradually Appeared Status: Date Acquired: 11/22/2017 Comorbid Lymphedema, Arrhythmia, Hypertension, Type II Weeks Of Treatment: 4 History: Diabetes, End Stage Renal Disease, Clustered Wound: No Rheumatoid Arthritis, Osteoarthritis, Confinement Anxiety Photos Photo Uploaded By: Montey Hora on 12/27/2017 11:37:37 Wound Measurements Length: (cm) 0.7 Width: (cm) 1.1 Depth: (cm) 0.1 Area: (cm) 0.605 Volume: (cm) 0.06 % Reduction in Area: 74.3% % Reduction in Volume: 74.6% Epithelialization: Small (1-33%) Tunneling: No Undermining: No Wound Description Classification: Unable to visualize wound bed Foul O Wound Margin: Flat and Intact Slough Exudate Amount: Medium Exudate Type: Serosanguineous Exudate Color: red, brown dor After Cleansing: No /Fibrino Yes Wound Bed Granulation Amount: Medium (34-66%) Exposed Structure Granulation Quality: Pink Fascia Exposed: No Necrotic Amount: Medium (34-66%) Fat Layer (Subcutaneous Tissue) Exposed: Yes Necrotic Quality: Adherent Slough Tendon Exposed: No Muscle Exposed: No Joint Exposed: No Bone Exposed: No Timothy Gibson, Timothy Gibson. ([id number]) Periwound Skin Texture Texture Color No Abnormalities Noted: No No Abnormalities Noted: No Callus: Yes Atrophie Blanche: No Crepitus: No Cyanosis: No Excoriation: No Ecchymosis: No Induration: No Erythema: No Rash: No Hemosiderin Staining: No Scarring: No Mottled: No Pallor: No Moisture Rubor: No No Abnormalities Noted: No Dry / Scaly: No Temperature / Pain Maceration: No Temperature: No Abnormality Wound Preparation Ulcer Cleansing: Rinsed/Irrigated with Saline Topical Anesthetic Applied: Other: lidocaine 4%, Treatment Notes Wound #1 (Right  Toe Great) 1. Cleansed with: Clean wound with Normal Saline 2. Anesthetic Topical Lidocaine 4% cream to wound bed prior to debridement 4. Dressing Applied: Santyl Ointment Notes saline wet gauze, cover with dry gauze wrap with conform and stretch net Electronic Signature(s) Signed: 12/27/2017 10:39:50 AM By: Montey Hora Entered By: Montey Hora on 12/27/2017 09:43:00 Timothy Gibson ([id number]) -------------------------------------------------------------------------------- [place] Details Patient Name: Timothy Gibson Date of Service: 12/27/2017 9:30 AM Medical Record Number: [id number] Patient Account Number: [id number] Date  of Birth/Sex: 05-Mar-1951 (67 y.o. M) Treating RN: Montey Hora Primary Care Delton Stelle: PATIENT, NO Other Clinician: Referring Bassheva Flury: Konrad Felix Treating Sufian Ravi/Extender: Cathie Olden in Treatment: 4 Vital Signs Time Taken: 09:38 Temperature (F): 97.8 Height (in): 76 Pulse (bpm): 78 Weight (lbs): 220 Respiratory Rate (breaths/min): 16 Body Mass Index (BMI): 26.8 Blood Pressure (mmHg): 188/76 Reference Range: 80 - 120 mg / dl Electronic Signature(s) Signed: 12/27/2017 10:39:50 AM By: Montey Hora Entered By: Montey Hora on 12/27/2017 09:38:43

## 2017-12-31 DIAGNOSIS — D509 Iron deficiency anemia, unspecified: Secondary | ICD-10-CM | POA: Diagnosis not present

## 2017-12-31 DIAGNOSIS — N186 End stage renal disease: Secondary | ICD-10-CM | POA: Diagnosis not present

## 2017-12-31 DIAGNOSIS — Z992 Dependence on renal dialysis: Secondary | ICD-10-CM | POA: Diagnosis not present

## 2017-12-31 DIAGNOSIS — D631 Anemia in chronic kidney disease: Secondary | ICD-10-CM | POA: Diagnosis not present

## 2017-12-31 DIAGNOSIS — N2581 Secondary hyperparathyroidism of renal origin: Secondary | ICD-10-CM | POA: Diagnosis not present

## 2017-12-31 DIAGNOSIS — M86171 Other acute osteomyelitis, right ankle and foot: Secondary | ICD-10-CM | POA: Diagnosis not present

## 2018-01-01 DIAGNOSIS — B351 Tinea unguium: Secondary | ICD-10-CM | POA: Diagnosis not present

## 2018-01-01 DIAGNOSIS — E1140 Type 2 diabetes mellitus with diabetic neuropathy, unspecified: Secondary | ICD-10-CM | POA: Diagnosis not present

## 2018-01-01 DIAGNOSIS — L97512 Non-pressure chronic ulcer of other part of right foot with fat layer exposed: Secondary | ICD-10-CM | POA: Diagnosis not present

## 2018-01-01 DIAGNOSIS — Z794 Long term (current) use of insulin: Secondary | ICD-10-CM | POA: Diagnosis not present

## 2018-01-02 DIAGNOSIS — D509 Iron deficiency anemia, unspecified: Secondary | ICD-10-CM | POA: Diagnosis not present

## 2018-01-02 DIAGNOSIS — Z992 Dependence on renal dialysis: Secondary | ICD-10-CM | POA: Diagnosis not present

## 2018-01-02 DIAGNOSIS — M86171 Other acute osteomyelitis, right ankle and foot: Secondary | ICD-10-CM | POA: Diagnosis not present

## 2018-01-02 DIAGNOSIS — N186 End stage renal disease: Secondary | ICD-10-CM | POA: Diagnosis not present

## 2018-01-02 DIAGNOSIS — N2581 Secondary hyperparathyroidism of renal origin: Secondary | ICD-10-CM | POA: Diagnosis not present

## 2018-01-02 DIAGNOSIS — D631 Anemia in chronic kidney disease: Secondary | ICD-10-CM | POA: Diagnosis not present

## 2018-01-03 ENCOUNTER — Encounter: Payer: Medicare Other | Admitting: Nurse Practitioner

## 2018-01-03 DIAGNOSIS — E441 Mild protein-calorie malnutrition: Secondary | ICD-10-CM | POA: Diagnosis not present

## 2018-01-03 DIAGNOSIS — N186 End stage renal disease: Secondary | ICD-10-CM | POA: Diagnosis not present

## 2018-01-03 DIAGNOSIS — E11621 Type 2 diabetes mellitus with foot ulcer: Secondary | ICD-10-CM | POA: Diagnosis not present

## 2018-01-03 DIAGNOSIS — E1122 Type 2 diabetes mellitus with diabetic chronic kidney disease: Secondary | ICD-10-CM | POA: Diagnosis not present

## 2018-01-03 DIAGNOSIS — I120 Hypertensive chronic kidney disease with stage 5 chronic kidney disease or end stage renal disease: Secondary | ICD-10-CM | POA: Diagnosis not present

## 2018-01-03 DIAGNOSIS — L97512 Non-pressure chronic ulcer of other part of right foot with fat layer exposed: Secondary | ICD-10-CM | POA: Diagnosis not present

## 2018-01-04 DIAGNOSIS — Z992 Dependence on renal dialysis: Secondary | ICD-10-CM | POA: Diagnosis not present

## 2018-01-04 DIAGNOSIS — N186 End stage renal disease: Secondary | ICD-10-CM | POA: Diagnosis not present

## 2018-01-04 DIAGNOSIS — N2581 Secondary hyperparathyroidism of renal origin: Secondary | ICD-10-CM | POA: Diagnosis not present

## 2018-01-04 DIAGNOSIS — D631 Anemia in chronic kidney disease: Secondary | ICD-10-CM | POA: Diagnosis not present

## 2018-01-04 DIAGNOSIS — D509 Iron deficiency anemia, unspecified: Secondary | ICD-10-CM | POA: Diagnosis not present

## 2018-01-04 DIAGNOSIS — M86171 Other acute osteomyelitis, right ankle and foot: Secondary | ICD-10-CM | POA: Diagnosis not present

## 2018-01-06 DIAGNOSIS — N186 End stage renal disease: Secondary | ICD-10-CM | POA: Diagnosis not present

## 2018-01-06 DIAGNOSIS — Z992 Dependence on renal dialysis: Secondary | ICD-10-CM | POA: Diagnosis not present

## 2018-01-07 DIAGNOSIS — M86171 Other acute osteomyelitis, right ankle and foot: Secondary | ICD-10-CM | POA: Diagnosis not present

## 2018-01-07 DIAGNOSIS — N2581 Secondary hyperparathyroidism of renal origin: Secondary | ICD-10-CM | POA: Diagnosis not present

## 2018-01-07 DIAGNOSIS — D509 Iron deficiency anemia, unspecified: Secondary | ICD-10-CM | POA: Diagnosis not present

## 2018-01-07 DIAGNOSIS — Z992 Dependence on renal dialysis: Secondary | ICD-10-CM | POA: Diagnosis not present

## 2018-01-07 DIAGNOSIS — D631 Anemia in chronic kidney disease: Secondary | ICD-10-CM | POA: Diagnosis not present

## 2018-01-07 DIAGNOSIS — N186 End stage renal disease: Secondary | ICD-10-CM | POA: Diagnosis not present

## 2018-01-07 DIAGNOSIS — Z5181 Encounter for therapeutic drug level monitoring: Secondary | ICD-10-CM | POA: Diagnosis not present

## 2018-01-08 NOTE — Progress Notes (Signed)
KEAHI, MCCARNEY ([id number]) Visit Report for 01/03/2018 Chief Complaint Document Details Patient Name: Timothy Gibson, Timothy Gibson Date of Service: 01/03/2018 9:15 AM Medical Record Number: [id number] Patient Account Number: [id number] Date of Birth/Sex: March 30, 1951 (67 y.o. M) Treating RN: Ahmed Prima Primary Care Provider: PATIENT, NO Other Clinician: Referring Provider: Konrad Felix Treating Provider/Extender: Cathie Olden in Treatment: 5 Information Obtained from: Patient Chief Complaint Right Great Toe Electronic Signature(s) Signed: 01/03/2018 12:44:07 PM By: Lawanda Cousins Entered By: Lawanda Cousins on 01/03/2018 12:44:07 Timothy Gibson ([id number]) -------------------------------------------------------------------------------- Debridement Details Patient Name: Timothy Gibson Date of Service: 01/03/2018 9:15 AM Medical Record Number: [id number] Patient Account Number: [id number] Date of Birth/Sex: 1950/08/05 (67 y.o. M) Treating RN: Ahmed Prima Primary Care Provider: PATIENT, NO Other Clinician: Referring Provider: Konrad Felix Treating Provider/Extender: Cathie Olden in Treatment: 5 Debridement Performed for Wound #1 Right Toe Great Assessment: Performed By: Physician Lawanda Cousins, NP Debridement Type: Debridement Severity of Tissue Pre Fat layer exposed Debridement: Pre-procedure Verification/Time Yes - 09:31 Out Taken: Start Time: 09:31 Pain Control: Lidocaine 4% Topical Solution Total Area Debrided (L x W): 0.7 (cm) x 0.6 (cm) = 0.42 (cm) Tissue and other material Viable, Non-Viable, Callus, Slough, Subcutaneous, Slough debrided: Level: Skin/Subcutaneous Tissue Debridement Description: Excisional Instrument: Curette Bleeding: Minimum Hemostasis Achieved: Pressure End Time: 09:33 Procedural Pain: 0 Post Procedural Pain: 0 Response to Treatment: Procedure was tolerated well Level of Consciousness: Awake and Alert Post Procedure  Vitals: Temperature: 98.1 Pulse: 89 Respiratory Rate: 18 Blood Pressure: Systolic Blood Pressure: 010 Diastolic Blood Pressure: 932 Post Debridement Measurements of Total Wound Length: (cm) 0.7 Width: (cm) 0.6 Depth: (cm) 0.2 Volume: (cm) 0.066 Character of Wound/Ulcer Post Debridement: Requires Further Debridement Severity of Tissue Post Debridement: Fat layer exposed Post Procedure Diagnosis Same as Pre-procedure Electronic Signature(s) Signed: 01/03/2018 9:32:27 PM By: Lawanda Cousins Signed: 01/07/2018 5:11:46 PM By: Lu Duffel ([id number]) Entered By: Alric Quan on 01/03/2018 09:32:51 RY, MOODY ([id number]) -------------------------------------------------------------------------------- HPI Details Patient Name: Timothy Gibson Date of Service: 01/03/2018 9:15 AM Medical Record Number: [id number] Patient Account Number: [id number] Date of Birth/Sex: 12/03/1950 (67 y.o. M) Treating RN: Ahmed Prima Primary Care Provider: PATIENT, NO Other Clinician: Referring Provider: Konrad Felix Treating Provider/Extender: Cathie Olden in Treatment: 5 History of Present Illness HPI Description: 11/27/17 on evaluation today patient presents for initial evaluation concerning an issue that he has been having with his right great toe which began last Thursday 11/22/17. He has a history of diabetes mellitus type to which he has had for greater than 30 years currently he is on insulin. Subsequently he also has a history of hypertension. On physical exam inspection is also appears he may have some peripheral vascular disease with his ABI's being noncompressible bilaterally. He is on dialysis as well and this is on Monday, Wednesday, and Friday. Patient was hospitalized back in January/February 2019 although this was more for stomach/back pain though they never told me exactly what was going on. This is according to the patient. Subsequently the  ulcer which is between the third and fourth toe when space of the right foot as well is on the plantar aspect of the fourth toe is due to him having cleaned his toes this morning and he states that his finger which is large split the toe tissue in between causing the wounds that we currently see. With that being said he states this has happened before I explained I would definitely recommend that he not do this any  longer. He can use a small soft washcloth to clean between the toes without causing trauma. Subsequently the right great toe hatchery does show evidence of necrotic tissue present on the surface of the wound specifically there is callous and dead skin surrounding which is trapping fluid causing problems as far as the wound is concerned. The surface of the wound does show slough although due to his vascular flow I'm not going to sharply debride this today I think I will selectively debride away the necrotic skin as well is callous from around the surrounding so this will not continue to be a moisture issue. Nonetheless the patient has no pain he does have diabetic neuropathy. 12/06/17-He is here in follow-up evaluation for a right great toe ulcer. He is voicing no complaints or concerns. He continues to infrequently/socially smokes cigars with no desire to quit. He has been compliant with offloading, using open toed surgical shoe; has been compliant using Santyl daily. He continues on clindamycin although takes it inconsistently secondary to indigestion. The plain film x-ray performed on 5/23 impression: Soft tissue swelling of the left first toe and is noted with probable irregular lucency involving distal tuft of first distal phalanx concerning for osteomyelitis. MRI may be performed for further evaluation; MRI ordered. The vascular evaluation performed on 5/24 field non-compressible ABIs bilaterally and reduced TBI bilaterally suggesting significant tibial disease. He will be referred to  vascular medicine for further evaluation. 12/13/17-He is here in follow-up evaluation for right great toe ulcer. He has appointment next Thursday for the MRI and vascular evaluation. Wound culture obtained last week grew abundant Klebsiella oxytoca (multidrug sensitivity), and abundant enteric coccus faecalis (sensitive to ampicillin). Ampicillin and Cipro were called in, along with a probiotic. In light of his appointments next Thursday he will follow-up in 2 weeks, we will continue with Santyl 12/27/17-He is here for evaluation for a right great toe ulcer. 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 He did have evaluation by vascular medicine, Dr. Ronalee Belts, on 6/13, at that appointment it was decided for him to undergo angiography with possible intervention but he refused and is still considering. If he chooses to have it done he will contact the office. He has not heard back from either ID offices that he was referred to two weeks ago: [place] and [place]. there is improvement in both appearance and measurement to the ulcer. We will extend antibiotic therapy (amoxicillin 500 every 12, Cipro 500 daily) for additional 2 weeks pending infectious disease consult, he will continue with Santyl daily. He was reminded to continue with probiotic therapy while on oral antibiotics; he denies any GI disturbance. 01/03/18-He is here in follow-up evaluation for right great toe ulcer. He is decided to not undergo any vascular intervention at this time. He was established with [facility] infectious disease (Dr. Linus Salmons) last week initiated on vancomycin and cefazolin with dialysis treatment for 6 weeks. We will continue with current treatment plan with Santyl and offloading and he will follow- up in 2 weeks Electronic Signature(s) Signed: 01/03/2018 12:47:42 PM By: Lawanda Cousins Entered By: Lawanda Cousins on 01/03/2018 12:47:42 Timothy Gibson ([id number]) -------------------------------------------------------------------------------- Physician Orders Details Patient Name: Timothy Gibson Date of Service: 01/03/2018 9:15 AM Medical Record Number: [id number] Patient Account Number: [id number] Date of Birth/Sex: 1951/05/31 (67 y.o. M) Treating RN: Ahmed Prima Primary Care Provider: PATIENT, NO Other Clinician: Referring Provider: Konrad Felix Treating Provider/Extender: Cathie Olden in  Treatment: 5 Verbal / Phone Orders: Yes Clinician: Pinkerton, Debi Read Back and Verified: Yes Diagnosis Coding Wound Cleansing Wound #1 Right Toe Great o Cleanse wound with mild soap and water Anesthetic (add to Medication List) Wound #1 Right Toe Great o Topical Lidocaine 4% cream applied to wound bed prior to debridement (In Clinic Only). Primary Wound Dressing Wound #1 Right Toe Great o Saline moistened gauze - only dampened o Santyl Ointment Secondary Dressing Wound #1 Right Toe Great o Gauze and Kerlix/Conform Dressing Change Frequency Wound #1 Right Toe Great o Change dressing every day. Follow-up Appointments Wound #1 Right Toe Great o Return Appointment in 2 weeks. Edema Control Wound #1 Right Toe Great o Elevate legs to the level of the heart and pump ankles as often as possible Off-Loading Wound #1 Right Toe Great o Open toe surgical shoe to: Additional Orders / Instructions Wound #1 Right Toe Great o Increase protein intake. Medications-please add to medication list. Wound #1 Right Toe Great o [address] DANZIG, MACGREGOR ([id number]) Patient Medications Allergies: no known allergies Notifications Medication Indication Start End lidocaine DOSE 1 - topical 4 % cream - 1 cream topical Electronic Signature(s) Signed: 01/03/2018 9:32:27 PM By: Lawanda Cousins Signed: 01/07/2018 5:11:46 PM By: Alric Quan Entered By: Alric Quan on 01/03/2018  09:34:06 EDMAR, [place] ([id number]) -------------------------------------------------------------------------------- Prescription 01/03/2018 Patient Name: Timothy Gibson Provider: Lawanda Cousins NP Date of Birth: 29-Jun-1951 NPI#: [id number] Sex: M DEA#: [id number] Phone #: [phone] License #: Patient Address: [facility] [place], [place] [address] [address], [place], [place] [address] [phone] Allergies no known allergies Medication Medication: Route: Strength: Form: lidocaine topical 4% cream Class: TOPICAL LOCAL ANESTHETICS Dose: Frequency / Time: Indication: 1 1 cream topical Number of Refills: Number of Units: 0 Generic Substitution: Start Date: End Date: Administered at Substitution Permitted Facility: Yes Time Administered: Time Discontinued: Note to Pharmacy: Signature(s): Date(s): Electronic Signature(s) Signed: 01/03/2018 9:32:27 PM By: Lawanda Cousins Signed: 01/07/2018 5:11:46 PM By: Alric Quan Entered By: Alric Quan on 01/03/2018 09:34:07 Timothy Gibson ([id number]) EMAD, BRECHTEL ([id number]) --------------------------------------------------------------------------------  Problem List Details Patient Name: Timothy Gibson Date of Service: 01/03/2018 9:15 AM Medical Record Number: [id number] Patient Account Number: [id number] Date of Birth/Sex: 1950-09-11 (67 y.o. M) Treating RN: Ahmed Prima Primary Care Provider: PATIENT, NO Other Clinician: Referring Provider: Konrad Felix Treating Provider/Extender: Cathie Olden in Treatment: 5 Active Problems ICD-10 Evaluated Encounter Code Description Active Date Today Diagnosis E11.621 Type 2 diabetes mellitus with foot ulcer 11/27/2017 No Yes I70.235 Atherosclerosis of native arteries of right leg with ulceration of 12/06/2017 No Yes other part of foot L97.512  Non-pressure chronic ulcer of other part of right foot with fat 11/27/2017 No Yes layer exposed I10 Essential (primary) hypertension 11/27/2017 No Yes E44.1 Mild protein-calorie malnutrition 11/27/2017 No Yes M86.671 Other chronic osteomyelitis, right ankle and foot 12/27/2017 No Yes Inactive Problems Resolved Problems Electronic Signature(s) Signed: 01/03/2018 12:41:28 PM By: Lawanda Cousins Entered By: Lawanda Cousins on 01/03/2018 12:41:28 Timothy Gibson ([id number]) -------------------------------------------------------------------------------- Progress Note Details Patient Name: Timothy Gibson Date of Service: 01/03/2018 9:15 AM Medical Record Number: [id number] Patient Account Number: [id number] Date of Birth/Sex: May 20, 1951 (67 y.o. M) Treating RN: Ahmed Prima Primary Care Provider: PATIENT, NO Other Clinician: Referring Provider: Konrad Felix Treating Provider/Extender: Cathie Olden in Treatment: 5 Subjective Chief Complaint Information obtained from Patient Right Great Toe History of Present Illness (HPI) 11/27/17 on  evaluation today patient presents for initial evaluation concerning an issue that he has been having with his right great toe which began last Thursday 11/22/17. He has a history of diabetes mellitus type to which he has had for greater than 30 years currently he is on insulin. Subsequently he also has a history of hypertension. On physical exam inspection is also appears he may have some peripheral vascular disease with his ABI's being noncompressible bilaterally. He is on dialysis as well and this is on Monday, Wednesday, and Friday. Patient was hospitalized back in January/February 2019 although this was more for stomach/back pain though they never told me exactly what was going on. This is according to the patient. Subsequently the ulcer which is between the third and fourth toe when space of the right foot as well is on the plantar aspect of the  fourth toe is due to him having cleaned his toes this morning and he states that his finger which is large split the toe tissue in between causing the wounds that we currently see. With that being said he states this has happened before I explained I would definitely recommend that he not do this any longer. He can use a small soft washcloth to clean between the toes without causing trauma. Subsequently the right great toe hatchery does show evidence of necrotic tissue present on the surface of the wound specifically there is callous and dead skin surrounding which is trapping fluid causing problems as far as the wound is concerned. The surface of the wound does show slough although due to his vascular flow I'm not going to sharply debride this today I think I will selectively debride away the necrotic skin as well is callous from around the surrounding so this will not continue to be a moisture issue. Nonetheless the patient has no pain he does have diabetic neuropathy. 12/06/17-He is here in follow-up evaluation for a right great toe ulcer. He is voicing no complaints or concerns. He continues to infrequently/socially smokes cigars with no desire to quit. He has been compliant with offloading, using open toed surgical shoe; has been compliant using Santyl daily. He continues on clindamycin although takes it inconsistently secondary to indigestion. The plain film x-ray performed on 5/23 impression: Soft tissue swelling of the left first toe and is noted with probable irregular lucency involving distal tuft of first distal phalanx concerning for osteomyelitis. MRI may be performed for further evaluation; MRI ordered. The vascular evaluation performed on 5/24 field non-compressible ABIs bilaterally and reduced TBI bilaterally suggesting significant tibial disease. He will be referred to vascular medicine for further evaluation. 12/13/17-He is here in follow-up evaluation for right great toe ulcer. He has  appointment next Thursday for the MRI and vascular evaluation. Wound culture obtained last week grew abundant Klebsiella oxytoca (multidrug sensitivity), and abundant enteric coccus faecalis (sensitive to ampicillin). Ampicillin and Cipro were called in, along with a probiotic. In light of his appointments next Thursday he will follow-up in 2 weeks, we will continue with Santyl 12/27/17-He is here for evaluation for a right great toe ulcer. 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 He did have evaluation by vascular medicine, Dr. Ronalee Belts, on 6/13, at that appointment it was decided for him to undergo angiography with possible intervention but he refused and is still considering. If he chooses to have it done he will contact the office. He has not heard back from either  ID offices that he was referred to two weeks ago: [place] and [place]. there is improvement in both appearance and measurement to the ulcer. We will extend antibiotic therapy (amoxicillin 500 every 12, Cipro 500 daily) for additional 2 weeks pending infectious disease consult, he will continue with Santyl daily. He was reminded to continue with probiotic therapy while on oral antibiotics; he denies any GI disturbance. 01/03/18-He is here in follow-up evaluation for right great toe ulcer. He is decided to not undergo any vascular intervention at this time. He was established with [facility] infectious disease (Dr. Linus Salmons) last week initiated on vancomycin and cefazolin with dialysis treatment for 6 weeks. We will continue with current treatment plan with Santyl and offloading and he will follow- up in 2 weeks MARKIAN, GLOCKNER ([id number]) Patient History Information obtained from Patient. Family History Cancer - Mother,Father, Diabetes - Mother, Heart Disease - Mother, Hypertension - Mother,Father, Stroke - Father, Thyroid Problems - Mother, No family  history of Kidney Disease, Lung Disease, Seizures, Tuberculosis. Social History Former smoker - cigar about 4 to 5 days, Marital Status - Divorced, Alcohol Use - Never, Drug Use - No History, Caffeine Use - Daily. Medical History Hospitalization/Surgery History - 08/11/2017, [facility], stomach and back pain. Review of Systems (ROS) Constitutional Symptoms (General Health) Denies complaints or symptoms of Fatigue, Fever, Chills. Objective Constitutional Vitals Time Taken: 9:12 AM, Height: 76 in, Weight: 220 lbs, BMI: 26.8, Temperature: 98.1 F, Pulse: 89 bpm, Respiratory Rate: 16 breaths/min, Blood Pressure: 188/103 mmHg. Integumentary (Hair, Skin) Wound #1 status is Open. Original cause of wound was Gradually Appeared. The wound is located on the Right Toe Great. The wound measures 0.7cm length x 0.6cm width x 0.1cm depth; 0.33cm^2 area and 0.033cm^3 volume. There is Fat Layer (Subcutaneous Tissue) Exposed exposed. There is no tunneling or undermining noted. There is a none present amount of drainage noted. The wound margin is well defined and not attached to the wound base. There is medium (34-66%) pink granulation within the wound bed. There is a medium (34-66%) amount of necrotic tissue within the wound bed including Adherent Slough. The periwound skin appearance exhibited: Callus. The periwound skin appearance did not exhibit: Crepitus, Excoriation, Induration, Rash, Scarring, Dry/Scaly, Maceration, Atrophie Blanche, Cyanosis, Ecchymosis, Hemosiderin Staining, Mottled, Pallor, Rubor, Erythema. Periwound temperature was noted as No Abnormality. 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 ([id number]) Other chronic  osteomyelitis, right ankle and foot Procedures Wound #1 Pre-procedure diagnosis of Wound #1 is a Diabetic Wound/Ulcer of the Lower Extremity located on the Right Toe Great .Severity of Tissue Pre Debridement is: Fat layer exposed. There was a Excisional Skin/Subcutaneous Tissue Debridement with a total area of 0.42 sq cm performed by Lawanda Cousins, NP. With the following instrument(s): Curette to remove Viable and Non-Viable tissue/material. Material removed includes Callus, Subcutaneous Tissue, and Slough after achieving pain control using Lidocaine 4% Topical Solution. No specimens were taken. A time out was conducted at 09:31, prior to the start of the procedure. A Minimum amount of bleeding was controlled with Pressure. The procedure was tolerated well with a pain level of 0 throughout and a pain level of 0 following the procedure. Patient s Level of Consciousness post procedure was recorded as Awake and Alert. Post Debridement Measurements: 0.7cm length x  0.6cm width x 0.2cm depth; 0.066cm^3 volume. Character of Wound/Ulcer Post Debridement requires further debridement. Severity of Tissue Post Debridement is: Fat layer exposed. Post procedure Diagnosis Wound #1: Same as Pre-Procedure Plan Wound Cleansing: Wound #1 Right Toe Great: Cleanse wound with mild soap and water Anesthetic (add to Medication List): Wound #1 Right Toe Great: Topical Lidocaine 4% cream applied to wound bed prior to debridement (In Clinic Only). Primary Wound Dressing: Wound #1 Right Toe Great: Saline moistened gauze - only dampened Santyl Ointment Secondary Dressing: Wound #1 Right Toe Great: Gauze and Kerlix/Conform Dressing Change Frequency: Wound #1 Right Toe Great: Change dressing every day. Follow-up Appointments: Wound #1 Right Toe Great: Return Appointment in 2 weeks. Edema Control: Wound #1 Right Toe Great: Elevate legs to the level of the heart and pump ankles as often as  possible Off-Loading: Wound #1 Right Toe Great: Open toe surgical shoe to: Additional Orders / Instructions: Wound #1 Right Toe Great: Increase protein intake. ROMARI, GASPARRO ([id number]) Medications-please add to medication list.: Wound #1 Right Toe Great: Santyl Enzymatic Ointment The following medication(s) was prescribed: lidocaine topical 4 % cream 1 1 cream topical was prescribed at facility Electronic Signature(s) Signed: 01/03/2018 12:48:07 PM By: Lawanda Cousins Entered By: Lawanda Cousins on 01/03/2018 12:48:07 Timothy Gibson ([id number]) -------------------------------------------------------------------------------- ROS/PFSH Details Patient Name: Timothy Gibson Date of Service: 01/03/2018 9:15 AM Medical Record Number: [id number] Patient Account Number: [id number] Date of Birth/Sex: 04/30/1951 (67 y.o. M) Treating RN: Ahmed Prima Primary Care Provider: PATIENT, NO Other Clinician: Referring Provider: Konrad Felix Treating Provider/Extender: Cathie Olden in Treatment: 5 Information Obtained From Patient Wound History Constitutional Symptoms (General Health) Complaints and Symptoms: Negative for: Fatigue; Fever; Chills Eyes Medical History: Negative for: Cataracts; Glaucoma; Optic Neuritis Ear/Nose/Mouth/Throat Medical History: Negative for: Chronic sinus problems/congestion; Middle ear problems Hematologic/Lymphatic Medical History: Positive for: Lymphedema Negative for: Anemia; Hemophilia; Human Immunodeficiency Virus; Sickle Cell Disease Respiratory Medical History: Negative for: Aspiration; Asthma; Chronic Obstructive Pulmonary Disease (COPD); Pneumothorax; Sleep Apnea; Tuberculosis Cardiovascular Medical History: Positive for: Arrhythmia; Hypertension Negative for: Congestive Heart Failure; Coronary Artery Disease; Deep Vein Thrombosis; Hypotension; Myocardial Infarction; Peripheral Arterial Disease; Peripheral Venous Disease;  Phlebitis; Vasculitis Gastrointestinal Medical History: Negative for: Cirrhosis ; Colitis; Crohnos; Hepatitis A; Hepatitis B; Hepatitis C Endocrine Medical History: Positive for: Type II Diabetes Time with diabetes: 80 years Treated with: Diet HARLIN, MAZZONI. ([id number]) Blood sugar tested every day: No Genitourinary Medical History: Positive for: End Stage Renal Disease Immunological Medical History: Negative for: Lupus Erythematosus; Raynaudos Musculoskeletal Medical History: Positive for: Rheumatoid Arthritis; Osteoarthritis Negative for: Gout; Osteomyelitis Neurologic Medical History: Negative for: Dementia; Neuropathy; Quadriplegia; Paraplegia; Seizure Disorder Oncologic Medical History: Negative for: Received Chemotherapy; Received Radiation Psychiatric Medical History: Positive for: Confinement Anxiety Negative for: Anorexia/bulimia Immunizations Pneumococcal Vaccine: Received Pneumococcal Vaccination: Yes Implantable Devices Hospitalization / Surgery History Name of Hospital Purpose of Hospitalization/Surgery Date [facility] stomach and back pain 08/11/2017 Family and Social History Cancer: Yes - Mother,Father; Diabetes: Yes - Mother; Heart Disease: Yes - Mother; Hypertension: Yes - Mother,Father; Kidney Disease: No; Lung Disease: No; Seizures: No; Stroke: Yes - Father; Thyroid Problems: Yes - Mother; Tuberculosis: No; Former smoker - cigar about 4 to 5 days; Marital Status - Divorced; Alcohol Use: Never; Drug Use: No History; Caffeine Use: Daily; Financial Concerns: No; Food, Clothing or Shelter Needs: No; Support System Lacking: No; Transportation Concerns: No; Advanced Directives: Yes (Not Provided); Patient does not want information on Advanced Directives; Do not resuscitate: No; Living Will: Yes (Not  Provided); Medical Power of Attorney: Yes (Not Provided) Electronic Signature(s) Signed: 01/03/2018 9:32:27 PM By: Lawanda Cousins Signed: 01/07/2018 5:11:46 PM By:  Alric Quan Entered By: Lawanda Cousins on 01/03/2018 12:47:53 KOOPER, GODSHALL ([id number]) GOTTLIEB, ZUERCHER ([id number]) -------------------------------------------------------------------------------- [place] Details Patient Name: Timothy Gibson Date of Service: 01/03/2018 Medical Record Number: [id number] Patient Account Number: [id number] Date of Birth/Sex: Nov 16, 1950 (67 y.o. 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, right ankle and foot Facility Procedures CPT4 Code: 73532992 Description: 42683 - DEB SUBQ TISSUE 20 SQ CM/< ICD-10 Diagnosis Description L97.512 Non-pressure chronic ulcer of other part of right foot with fat l E11.621 Type 2 diabetes mellitus with foot ulcer Modifier: ayer exposed Quantity: 1 Physician Procedures CPT4 Code: 4196222 Description: 11042 - WC PHYS SUBQ TISS 20 SQ CM ICD-10 Diagnosis Description L97.512 Non-pressure chronic ulcer of other part of right foot with fat l E11.621 Type 2 diabetes mellitus with foot ulcer Modifier: ayer exposed Quantity: 1 Electronic Signature(s) Signed: 01/03/2018 12:48:19 PM By: Lawanda Cousins Entered By: Lawanda Cousins on 01/03/2018 12:48:19

## 2018-01-08 NOTE — Progress Notes (Signed)
Timothy Gibson, Timothy Gibson ([id number]) Visit Report for 01/03/2018 Arrival Information Details Patient Name: Timothy Gibson, Timothy Gibson Date of Service: 01/03/2018 9:15 AM Medical Record Number: [id number] Patient Account Number: [id number] Date of Birth/Sex: 10-26-1950 (67 y.o. M) Treating RN: Secundino Ginger Primary Care [name]: PATIENT, NO Other Clinician: Referring Adrena Nakamura: Konrad Felix Treating Mattalynn Crandle/Extender: Cathie Olden in Treatment: 5 Visit Information History Since Last Visit Added or deleted any medications: No Patient Arrived: Ambulatory Any new allergies or adverse reactions: No Arrival Time: 09:08 Had a fall or experienced change in No Accompanied By: self activities of daily living that may affect Transfer Assistance: None risk of falls: Patient Identification Verified: Yes Signs or symptoms of abuse/neglect since last visito No Secondary Verification Process Completed: Yes Hospitalized since last visit: No Patient Requires Transmission-Based No Implantable device outside of the clinic excluding No Precautions: cellular tissue based products placed in the center Patient Has Alerts: No since last visit: Has Dressing in Place as Prescribed: Yes Pain Present Now: No Electronic Signature(s) Signed: 01/03/2018 2:55:11 PM By: Secundino Ginger Entered By: Secundino Ginger on 01/03/2018 09:12:27 Timothy Gibson ([id number]) -------------------------------------------------------------------------------- Encounter Discharge Information Details Patient Name: Timothy Gibson Date of Service: 01/03/2018 9:15 AM Medical Record Number: [id number] Patient Account Number: [id number] Date of Birth/Sex: [birth year] (67 y.o. M) Treating RN: Montey Hora Primary Care Mende Biswell: PATIENT, NO Other Clinician: Referring Jayleigh Notarianni: Konrad Felix Treating Haneen Bernales/Extender: Cathie Olden in Treatment: 5 Encounter Discharge Information Items Discharge Condition: Stable Ambulatory Status:  Ambulatory Discharge Destination: Home Transportation: Private Auto Accompanied By: caregiver Schedule Follow-up Appointment: Yes Clinical Summary of Care: Electronic Signature(s) Signed: 01/03/2018 4:38:21 PM By: Montey Hora Entered By: Montey Hora on 01/03/2018 09:44:26 Timothy Gibson ([id number]) -------------------------------------------------------------------------------- Lower Extremity Assessment Details Patient Name: Timothy Gibson Date of Service: 01/03/2018 9:15 AM Medical Record Number: [id number] Patient Account Number: [id number] Date of Birth/Sex: 1951-06-26 (67 y.o. M) Treating RN: Secundino Ginger Primary Care Gatlin Kittell: PATIENT, NO Other Clinician: Referring Benetta Maclaren: Konrad Felix Treating Blondell Laperle/Extender: Cathie Olden in Treatment: 5 Edema Assessment Assessed: [Left: No] [Right: No] Edema: [Left: N] [Right: o] Calf Left: Right: Point of Measurement: 36 cm From Medial Instep cm 40.5 cm Ankle Left: Right: Point of Measurement: 16 cm From Medial Instep cm 26 cm Vascular Assessment Claudication: Claudication Assessment [Right:None] Pulses: Dorsalis Pedis Palpable: [Right:Yes] Posterior Tibial Extremity colors, hair growth, and conditions: Extremity Color: [Right:Hyperpigmented] Temperature of Extremity: [Right:Warm] Capillary Refill: [Right:< 3 seconds] Toe Nail Assessment Left: Right: Thick: Yes Deformed: Yes Improper Length and Hygiene: No Electronic Signature(s) Signed: 01/03/2018 2:55:11 PM By: Secundino Ginger Entered By: Secundino Ginger on 01/03/2018 09:23:50 Timothy Gibson ([id number]) -------------------------------------------------------------------------------- Multi Wound Chart Details Patient Name: Timothy Gibson Date of Service: 01/03/2018 9:15 AM Medical Record Number: [id number] Patient Account Number: [id number] Date of Birth/Sex: 07-19-1950 (67 y.o. M) Treating RN: Ahmed Prima Primary Care Tiane Szydlowski: PATIENT, NO Other  Clinician: Referring Mcgwire Dasaro: Konrad Felix Treating Rashaun Curl/Extender: Cathie Olden in Treatment: 5 Vital Signs Height(in): 76 Pulse(bpm): 89 Weight(lbs): 220 Blood Pressure(mmHg): 188/103 Body Mass Index(BMI): 27 Temperature(F): 98.1 Respiratory Rate 16 (breaths/min): Photos: [N/A:N/A] Wound Location: Right Toe Great N/A N/A Wounding Event: Gradually Appeared N/A N/A Primary Etiology: Diabetic Wound/Ulcer of the N/A N/A Lower Extremity Comorbid History: Lymphedema, Arrhythmia, N/A N/A Hypertension, Type II Diabetes, End Stage Renal Disease, Rheumatoid Arthritis, Osteoarthritis, Confinement Anxiety Date Acquired: 11/22/2017 N/A N/A Weeks of Treatment: 5 N/A N/A Wound Status: Open N/A N/A Measurements L x W x D 0.7x0.6x0.1 N/A N/A (cm) Area (cm) :  0.33 N/A N/A Volume (cm) : 0.033 N/A N/A % Reduction in Area: 86.00% N/A N/A % Reduction in Volume: 86.00% N/A N/A Classification: Grade 2 N/A N/A Exudate Amount: None Present N/A N/A Wound Margin: Well defined, not attached N/A N/A Granulation Amount: Medium (34-66%) N/A N/A Granulation Quality: Pink N/A N/A Necrotic Amount: Medium (34-66%) N/A N/A Exposed Structures: Fat Layer (Subcutaneous N/A N/A Tissue) Exposed: Yes Fascia: No Tendon: No Timothy Gibson, Timothy Gibson ([id number]) Muscle: No Joint: No Bone: No Epithelialization: Small (1-33%) N/A N/A Debridement: Debridement - Excisional N/A N/A Pre-procedure 09:31 N/A N/A Verification/Time Out Taken: Pain Control: Lidocaine 4% Topical Solution N/A N/A Tissue Debrided: Callus, Subcutaneous, Slough N/A N/A Level: Skin/Subcutaneous Tissue N/A N/A Debridement Area (sq cm): 0.42 N/A N/A Instrument: Curette N/A N/A Bleeding: Minimum N/A N/A Hemostasis Achieved: Pressure N/A N/A Procedural Pain: 0 N/A N/A Post Procedural Pain: 0 N/A N/A Debridement Treatment Procedure was tolerated well N/A N/A Response: Post Debridement 0.7x0.6x0.2 N/A N/A Measurements L x  W x D (cm) Post Debridement Volume: 0.066 N/A N/A (cm) Periwound Skin Texture: Callus: Yes N/A N/A Excoriation: No Induration: No Crepitus: No Rash: No Scarring: No Periwound Skin Moisture: Maceration: No N/A N/A Dry/Scaly: No Periwound Skin Color: Atrophie Blanche: No N/A N/A Cyanosis: No Ecchymosis: No Erythema: No Hemosiderin Staining: No Mottled: No Pallor: No Rubor: No Temperature: No Abnormality N/A N/A Tenderness on Palpation: No N/A N/A Wound Preparation: Ulcer Cleansing: N/A N/A Rinsed/Irrigated with Saline Topical Anesthetic Applied: Other: lidocaine 4% Procedures Performed: Debridement N/A N/A Treatment Notes Wound #1 (Right Toe Great) 1. Cleansed with: Clean wound with Normal Saline 2. Anesthetic Topical Lidocaine 4% cream to wound bed prior to debridement 4. Dressing Applied: [address] Timothy Gibson, Timothy Gibson ([id number]) 5. Secondary Dressing Applied Dry Gauze Kerlix/Conform 7. Secured with Tape Notes saline wet gauze, cover with dry gauze wrap with conform and stretch net Electronic Signature(s) Signed: 01/03/2018 12:42:15 PM By: Lawanda Cousins Entered By: Lawanda Cousins on 01/03/2018 12:42:15 Timothy Gibson, Timothy Gibson ([id number]) -------------------------------------------------------------------------------- [place] Details Patient Name: Timothy Gibson Date of Service: 01/03/2018 9:15 AM Medical Record Number: [id number] Patient Account Number: [id number] Date of Birth/Sex: 08/23/1950 (67 y.o. M) Treating RN: Ahmed Prima Primary Care Kemoni Ortega: PATIENT, NO Other Clinician: Referring Lavaughn Bisig: Konrad Felix Treating Nykeria Mealing/Extender: Cathie Olden in Treatment: 5 Active Inactive ` Abuse / Safety / Falls / Self Care Management Nursing Diagnoses: Potential for falls Goals: Patient will remain injury free related to falls Date Initiated: 11/27/2017 Target Resolution Date: 12/14/2017 Goal Status:  Active Interventions: Assess fall risk on admission and as needed Notes: ` Nutrition Nursing Diagnoses: Impaired glucose control: actual or potential Goals: Patient/caregiver verbalizes understanding of need to maintain therapeutic glucose control per primary care physician Date Initiated: 11/27/2017 Target Resolution Date: 12/28/2017 Goal Status: Active Interventions: Provide education on elevated blood sugars and impact on wound healing Notes: ` Orientation to the Wound Care Program Nursing Diagnoses: Knowledge deficit related to the wound healing center program Goals: Patient/caregiver will verbalize understanding of the [place] Date Initiated: 11/27/2017 Target Resolution Date: 12/28/2017 Goal Status: Active Interventions: Timothy Gibson, Timothy Gibson ([id number]) Provide education on orientation to the wound center Notes: ` Soft Tissue Infection Nursing Diagnoses: Potential for infection: soft tissue Goals: Patient will remain free of wound infection Date Initiated: 11/27/2017 Target Resolution Date: 12/28/2017 Goal Status: Active Interventions: Assess signs and symptoms of infection every visit Treatment Activities: Systemic antibiotics : 11/27/2017 Notes: ` Wound/Skin Impairment Nursing Diagnoses: Impaired tissue integrity Goals: Ulcer/skin breakdown  will have a volume reduction of 80% by week 12 Date Initiated: 11/27/2017 Target Resolution Date: 02/27/2018 Goal Status: Active Interventions: Assess patient/caregiver ability to perform ulcer/skin care regimen upon admission and as needed Treatment Activities: Referred to DME Tsuyako Jolley for dressing supplies : 11/27/2017 Skin care regimen initiated : 11/27/2017 Notes: Electronic Signature(s) Signed: 01/07/2018 5:11:46 PM By: Alric Quan Entered By: Alric Quan on 01/03/2018 09:30:09 Timothy Gibson  ([id number]) -------------------------------------------------------------------------------- Pain Assessment Details Patient Name: Timothy Gibson Date of Service: 01/03/2018 9:15 AM Medical Record Number: [id number] Patient Account Number: [id number] Date of Birth/Sex: [birth year] (67 y.o. M) Treating RN: Secundino Ginger Primary Care Demoni Parmar: PATIENT, NO Other Clinician: Referring Ralphael Southgate: Konrad Felix Treating Vadis Slabach/Extender: Cathie Olden in Treatment: 5 Active Problems Location of Pain Severity and Description of Pain Patient Has Paino No Site Locations Pain Management and Medication Current Pain Management: Goals for Pain Management topical or injectable lidocaine is offered to patient for acute pain when surgical debridement is performed. if needed, Patient is instructed to use over the counter pain medication for the following 24-48 hours after debridement. wound care MDs do not prescribe pain medications. patient has chronic pain or uncontrolled pain. patient has been instructed to make appointment with their primary care physician for pain management. Electronic Signature(s) Signed: 01/03/2018 2:55:11 PM By: Secundino Ginger Entered By: Secundino Ginger on 01/03/2018 09:29:00 Timothy Gibson ([id number]) -------------------------------------------------------------------------------- Patient/Caregiver Education Details Patient Name: Timothy Gibson Date of Service: 01/03/2018 9:15 AM Medical Record Number: [id number] Patient Account Number: [id number] Date of Birth/Gender: [birth year] (67 y.o. M) Treating RN: Montey Hora Primary Care Physician: PATIENT, NO Other Clinician: Referring Physician: Konrad Felix Treating Physician/Extender: Cathie Olden in Treatment: 5 Education Assessment Education Provided To: Patient and Caregiver Education Topics Provided Wound/Skin Impairment: Handouts: Other: wound care as ordered Methods: Demonstration,  Explain/Verbal Responses: State content correctly Electronic Signature(s) Signed: 01/03/2018 4:38:21 PM By: Montey Hora Entered By: Montey Hora on 01/03/2018 09:44:57 Timothy Gibson ([id number]) -------------------------------------------------------------------------------- Wound Assessment Details Patient Name: Timothy Gibson Date of Service: 01/03/2018 9:15 AM Medical Record Number: [id number] Patient Account Number: [id number] Date of Birth/Sex: [birth year] (67 y.o. M) Treating RN: Secundino Ginger Primary Care Joni Colegrove: PATIENT, NO Other Clinician: Referring Jarrah Babich: Konrad Felix Treating Billye Nydam/Extender: Cathie Olden in Treatment: 5 Wound Status Wound Number: 1 Primary Diabetic Wound/Ulcer of the Lower Extremity Etiology: Wound Location: Right Toe Great Wound Open Wounding Event: Gradually Appeared Status: Date Acquired: 11/22/2017 Comorbid Lymphedema, Arrhythmia, Hypertension, Type II Weeks Of Treatment: 5 History: Diabetes, End Stage Renal Disease, Clustered Wound: No Rheumatoid Arthritis, Osteoarthritis, Confinement Anxiety Photos Photo Uploaded By: Secundino Ginger on 01/03/2018 09:27:39 Wound Measurements Length: (cm) 0.7 Width: (cm) 0.6 Depth: (cm) 0.1 Area: (cm) 0.33 Volume: (cm) 0.033 % Reduction in Area: 86% % Reduction in Volume: 86% Epithelialization: Small (1-33%) Tunneling: No Undermining: No Wound Description Classification: Grade 2 Foul Odor A Wound Margin: Well defined, not attached Slough/Fibr Exudate Amount: None Present fter Cleansing: No ino Yes Wound Bed Granulation Amount: Medium (34-66%) Exposed Structure Granulation Quality: Pink Fascia Exposed: No Necrotic Amount: Medium (34-66%) Fat Layer (Subcutaneous Tissue) Exposed: Yes Necrotic Quality: Adherent Slough Tendon Exposed: No Muscle Exposed: No Joint Exposed: No Bone Exposed: No Periwound Skin Texture Texture Color Timothy Gibson, Timothy Gibson. ([id number]) No Abnormalities  Noted: No No Abnormalities Noted: No Callus: Yes Atrophie Blanche: No Crepitus: No Cyanosis: No Excoriation: No Ecchymosis: No Induration: No Erythema: No Rash: No Hemosiderin Staining: No Scarring: No Mottled: No Pallor: No Moisture Rubor: No No  Abnormalities Noted: No Dry / Scaly: No Temperature / Pain Maceration: No Temperature: No Abnormality Wound Preparation Ulcer Cleansing: Rinsed/Irrigated with Saline Topical Anesthetic Applied: Other: lidocaine 4%, Treatment Notes Wound #1 (Right Toe Great) 1. Cleansed with: Clean wound with Normal Saline 2. Anesthetic Topical Lidocaine 4% cream to wound bed prior to debridement 4. Dressing Applied: Santyl Ointment 5. Secondary Dressing Applied Dry Gauze Kerlix/Conform 7. Secured with Tape Notes saline wet gauze, cover with dry gauze wrap with conform and stretch net Electronic Signature(s) Signed: 01/03/2018 2:55:11 PM By: Secundino Ginger Entered By: Secundino Ginger on 01/03/2018 09:21:06 Timothy Gibson ([id number]) -------------------------------------------------------------------------------- [place] Details Patient Name: Timothy Gibson Date of Service: 01/03/2018 9:15 AM Medical Record Number: [id number] Patient Account Number: [id number] Date of Birth/Sex: 05-Jan-1951 (67 y.o. M) Treating RN: Secundino Ginger Primary Care Amarys Sliwinski: PATIENT, NO Other Clinician: Referring Chabeli Barsamian: Konrad Felix Treating Magenta Schmiesing/Extender: Cathie Olden in Treatment: 5 Vital Signs Time Taken: 09:12 Temperature (F): 98.1 Height (in): 76 Pulse (bpm): 89 Weight (lbs): 220 Respiratory Rate (breaths/min): 16 Body Mass Index (BMI): 26.8 Blood Pressure (mmHg): 188/103 Reference Range: 80 - 120 mg / dl Electronic Signature(s) Signed: 01/03/2018 2:55:11 PM By: Secundino Ginger Entered By: Secundino Ginger on 01/03/2018 09:13:11

## 2018-01-09 DIAGNOSIS — N2581 Secondary hyperparathyroidism of renal origin: Secondary | ICD-10-CM | POA: Diagnosis not present

## 2018-01-09 DIAGNOSIS — M86171 Other acute osteomyelitis, right ankle and foot: Secondary | ICD-10-CM | POA: Diagnosis not present

## 2018-01-09 DIAGNOSIS — D509 Iron deficiency anemia, unspecified: Secondary | ICD-10-CM | POA: Diagnosis not present

## 2018-01-09 DIAGNOSIS — D631 Anemia in chronic kidney disease: Secondary | ICD-10-CM | POA: Diagnosis not present

## 2018-01-09 DIAGNOSIS — Z5181 Encounter for therapeutic drug level monitoring: Secondary | ICD-10-CM | POA: Diagnosis not present

## 2018-01-09 DIAGNOSIS — N186 End stage renal disease: Secondary | ICD-10-CM | POA: Diagnosis not present

## 2018-01-11 DIAGNOSIS — Z5181 Encounter for therapeutic drug level monitoring: Secondary | ICD-10-CM | POA: Diagnosis not present

## 2018-01-11 DIAGNOSIS — N2581 Secondary hyperparathyroidism of renal origin: Secondary | ICD-10-CM | POA: Diagnosis not present

## 2018-01-11 DIAGNOSIS — D631 Anemia in chronic kidney disease: Secondary | ICD-10-CM | POA: Diagnosis not present

## 2018-01-11 DIAGNOSIS — N186 End stage renal disease: Secondary | ICD-10-CM | POA: Diagnosis not present

## 2018-01-11 DIAGNOSIS — D509 Iron deficiency anemia, unspecified: Secondary | ICD-10-CM | POA: Diagnosis not present

## 2018-01-11 DIAGNOSIS — M86171 Other acute osteomyelitis, right ankle and foot: Secondary | ICD-10-CM | POA: Diagnosis not present

## 2018-01-14 DIAGNOSIS — Z5181 Encounter for therapeutic drug level monitoring: Secondary | ICD-10-CM | POA: Diagnosis not present

## 2018-01-14 DIAGNOSIS — D631 Anemia in chronic kidney disease: Secondary | ICD-10-CM | POA: Diagnosis not present

## 2018-01-14 DIAGNOSIS — M86171 Other acute osteomyelitis, right ankle and foot: Secondary | ICD-10-CM | POA: Diagnosis not present

## 2018-01-14 DIAGNOSIS — N186 End stage renal disease: Secondary | ICD-10-CM | POA: Diagnosis not present

## 2018-01-14 DIAGNOSIS — N2581 Secondary hyperparathyroidism of renal origin: Secondary | ICD-10-CM | POA: Diagnosis not present

## 2018-01-14 DIAGNOSIS — Z992 Dependence on renal dialysis: Secondary | ICD-10-CM | POA: Diagnosis not present

## 2018-01-14 DIAGNOSIS — E119 Type 2 diabetes mellitus without complications: Secondary | ICD-10-CM | POA: Diagnosis not present

## 2018-01-14 DIAGNOSIS — D509 Iron deficiency anemia, unspecified: Secondary | ICD-10-CM | POA: Diagnosis not present

## 2018-01-16 DIAGNOSIS — D509 Iron deficiency anemia, unspecified: Secondary | ICD-10-CM | POA: Diagnosis not present

## 2018-01-16 DIAGNOSIS — N2581 Secondary hyperparathyroidism of renal origin: Secondary | ICD-10-CM | POA: Diagnosis not present

## 2018-01-16 DIAGNOSIS — D631 Anemia in chronic kidney disease: Secondary | ICD-10-CM | POA: Diagnosis not present

## 2018-01-16 DIAGNOSIS — M86171 Other acute osteomyelitis, right ankle and foot: Secondary | ICD-10-CM | POA: Diagnosis not present

## 2018-01-16 DIAGNOSIS — N186 End stage renal disease: Secondary | ICD-10-CM | POA: Diagnosis not present

## 2018-01-16 DIAGNOSIS — Z5181 Encounter for therapeutic drug level monitoring: Secondary | ICD-10-CM | POA: Diagnosis not present

## 2018-01-17 ENCOUNTER — Encounter: Payer: Medicare Other | Attending: Nurse Practitioner | Admitting: Nurse Practitioner

## 2018-01-17 DIAGNOSIS — E1122 Type 2 diabetes mellitus with diabetic chronic kidney disease: Secondary | ICD-10-CM | POA: Diagnosis not present

## 2018-01-17 DIAGNOSIS — M069 Rheumatoid arthritis, unspecified: Secondary | ICD-10-CM | POA: Insufficient documentation

## 2018-01-17 DIAGNOSIS — Z794 Long term (current) use of insulin: Secondary | ICD-10-CM | POA: Insufficient documentation

## 2018-01-17 DIAGNOSIS — E1169 Type 2 diabetes mellitus with other specified complication: Secondary | ICD-10-CM | POA: Insufficient documentation

## 2018-01-17 DIAGNOSIS — L97512 Non-pressure chronic ulcer of other part of right foot with fat layer exposed: Secondary | ICD-10-CM | POA: Diagnosis not present

## 2018-01-17 DIAGNOSIS — M86671 Other chronic osteomyelitis, right ankle and foot: Secondary | ICD-10-CM | POA: Insufficient documentation

## 2018-01-17 DIAGNOSIS — E441 Mild protein-calorie malnutrition: Secondary | ICD-10-CM | POA: Diagnosis not present

## 2018-01-17 DIAGNOSIS — E11621 Type 2 diabetes mellitus with foot ulcer: Secondary | ICD-10-CM | POA: Diagnosis not present

## 2018-01-17 DIAGNOSIS — N186 End stage renal disease: Secondary | ICD-10-CM | POA: Diagnosis not present

## 2018-01-17 DIAGNOSIS — I120 Hypertensive chronic kidney disease with stage 5 chronic kidney disease or end stage renal disease: Secondary | ICD-10-CM | POA: Insufficient documentation

## 2018-01-17 DIAGNOSIS — Z992 Dependence on renal dialysis: Secondary | ICD-10-CM | POA: Diagnosis not present

## 2018-01-17 DIAGNOSIS — Z8249 Family history of ischemic heart disease and other diseases of the circulatory system: Secondary | ICD-10-CM | POA: Insufficient documentation

## 2018-01-17 DIAGNOSIS — F419 Anxiety disorder, unspecified: Secondary | ICD-10-CM | POA: Diagnosis not present

## 2018-01-17 DIAGNOSIS — E1151 Type 2 diabetes mellitus with diabetic peripheral angiopathy without gangrene: Secondary | ICD-10-CM | POA: Insufficient documentation

## 2018-01-17 DIAGNOSIS — F17290 Nicotine dependence, other tobacco product, uncomplicated: Secondary | ICD-10-CM | POA: Diagnosis not present

## 2018-01-18 DIAGNOSIS — Z5181 Encounter for therapeutic drug level monitoring: Secondary | ICD-10-CM | POA: Diagnosis not present

## 2018-01-18 DIAGNOSIS — N186 End stage renal disease: Secondary | ICD-10-CM | POA: Diagnosis not present

## 2018-01-18 DIAGNOSIS — D509 Iron deficiency anemia, unspecified: Secondary | ICD-10-CM | POA: Diagnosis not present

## 2018-01-18 DIAGNOSIS — N2581 Secondary hyperparathyroidism of renal origin: Secondary | ICD-10-CM | POA: Diagnosis not present

## 2018-01-18 DIAGNOSIS — D631 Anemia in chronic kidney disease: Secondary | ICD-10-CM | POA: Diagnosis not present

## 2018-01-18 DIAGNOSIS — M86171 Other acute osteomyelitis, right ankle and foot: Secondary | ICD-10-CM | POA: Diagnosis not present

## 2018-01-19 NOTE — Progress Notes (Signed)
PRAYAN, ULIN ([id number]) Visit Report for 01/17/2018 Arrival Information Details Patient Name: Timothy Gibson, Timothy Gibson Date of Service: 01/17/2018 9:30 AM Medical Record Number: [id number] Patient Account Number: [id number] Date of Birth/Sex: Dec 02, 1950 (67 y.o. M) Treating RN: Montey Hora Primary Care Baldwin Racicot: PATIENT, NO Other Clinician: Referring Glynna Failla: Konrad Felix Treating Aaliyah Cancro/Extender: Cathie Olden in Treatment: 7 Visit Information History Since Last Visit Added or deleted any medications: No Patient Arrived: Ambulatory Any new allergies or adverse reactions: No Arrival Time: 09:39 Had a fall or experienced change in No Accompanied By: friend activities of daily living that may affect Transfer Assistance: None risk of falls: Patient Identification Verified: Yes Signs or symptoms of abuse/neglect since last visito No Secondary Verification Process Completed: Yes Hospitalized since last visit: No Patient Requires Transmission-Based No Implantable device outside of the clinic excluding No Precautions: cellular tissue based products placed in the center Patient Has Alerts: No since last visit: Has Dressing in Place as Prescribed: Yes Pain Present Now: No Electronic Signature(s) Signed: 01/17/2018 5:06:29 PM By: Montey Hora Entered By: Montey Hora on 01/17/2018 09:41:02 Timothy Gibson ([id number]) -------------------------------------------------------------------------------- Encounter Discharge Information Details Patient Name: Timothy Gibson Date of Service: 01/17/2018 9:30 AM Medical Record Number: [id number] Patient Account Number: [id number] Date of Birth/Sex: June 28, 1951 (67 y.o. M) Treating RN: Ahmed Prima Primary Care Randa Riss: PATIENT, NO Other Clinician: Referring Vaniyah Lansky: Konrad Felix Treating Ameir Faria/Extender: Cathie Olden in Treatment: 7 Encounter Discharge Information Items Schedule Follow-up Appointment:  No Clinical Summary of Care: Electronic Signature(s) Unsigned Entered By: Alric Quan on 01/17/2018 10:32:02 Signature(s): Date(s): Timothy Gibson ([id number]) -------------------------------------------------------------------------------- Lower Extremity Assessment Details Patient Name: Timothy Gibson, Timothy Gibson Date of Service: 01/17/2018 9:30 AM Medical Record Number: [id number] Patient Account Number: [id number] Date of Birth/Sex: 1951/03/29 (67 y.o. M) Treating RN: Montey Hora Primary Care Trinidi Toppins: PATIENT, NO Other Clinician: Referring Ziya Coonrod: Konrad Felix Treating Nysia Dell/Extender: Cathie Olden in Treatment: 7 Edema Assessment Assessed: [Left: No] [Right: No] [Left: Edema] [Right: :] Calf Left: Right: Point of Measurement: 36 cm From Medial Instep cm 40.7 cm Ankle Left: Right: Point of Measurement: 16 cm From Medial Instep cm 26.3 cm Vascular Assessment Pulses: Dorsalis Pedis Palpable: [Right:Yes] Posterior Tibial Extremity colors, hair growth, and conditions: Extremity Color: [Right:Hyperpigmented] Hair Growth on Extremity: [Right:No] Temperature of Extremity: [Right:Warm] Capillary Refill: [Right:< 3 seconds] Toe Nail Assessment Left: Right: Thick: Yes Discolored: Yes Deformed: No Improper Length and Hygiene: Yes Electronic Signature(s) Signed: 01/17/2018 5:06:29 PM By: Montey Hora Entered By: Montey Hora on 01/17/2018 09:47:23 Timothy Gibson ([id number]) -------------------------------------------------------------------------------- Multi Wound Chart Details Patient Name: Timothy Gibson Date of Service: 01/17/2018 9:30 AM Medical Record Number: [id number] Patient Account Number: [id number] Date of Birth/Sex: 08-12-1950 (67 y.o. M) Treating RN: Ahmed Prima Primary Care Freida Nebel: PATIENT, NO Other Clinician: Referring Eben Choinski: Konrad Felix Treating Mahlet Jergens/Extender: Cathie Olden in Treatment: 7 Vital  Signs Height(in): 76 Pulse(bpm): 85 Weight(lbs): 220 Blood Pressure(mmHg): 169/88 Body Mass Index(BMI): 27 Temperature(F): 98.3 Respiratory Rate 16 (breaths/min): Photos: [N/A:N/A] Wound Location: Right Toe Great N/A N/A Wounding Event: Gradually Appeared N/A N/A Primary Etiology: Diabetic Wound/Ulcer of the N/A N/A Lower Extremity Comorbid History: Lymphedema, Arrhythmia, N/A N/A Hypertension, Type II Diabetes, End Stage Renal Disease, Rheumatoid Arthritis, Osteoarthritis, Confinement Anxiety Date Acquired: 11/22/2017 N/A N/A Weeks of Treatment: 7 N/A N/A Wound Status: Open N/A N/A Measurements L x W x D 0.6x0.8x0.1 N/A N/A (cm) Area (cm) : 0.377 N/A N/A Volume (cm) : 0.038 N/A N/A % Reduction in Area: 84.00% N/A N/A %  Reduction in Volume: 83.90% N/A N/A Classification: Grade 2 N/A N/A Exudate Amount: None Present N/A N/A Wound Margin: Well defined, not attached N/A N/A Granulation Amount: Medium (34-66%) N/A N/A Granulation Quality: Pink N/A N/A Necrotic Amount: Medium (34-66%) N/A N/A Exposed Structures: Fat Layer (Subcutaneous N/A N/A Tissue) Exposed: Yes Fascia: No Tendon: No Timothy Gibson, Timothy Gibson ([id number]) Muscle: No Joint: No Bone: No Epithelialization: Small (1-33%) N/A N/A Debridement: Debridement - Excisional N/A N/A Pre-procedure 10:21 N/A N/A Verification/Time Out Taken: Pain Control: Lidocaine 4% Topical Solution N/A N/A Tissue Debrided: Subcutaneous, Slough N/A N/A Level: Skin/Subcutaneous Tissue N/A N/A Debridement Area (sq cm): 0.48 N/A N/A Instrument: Curette N/A N/A Bleeding: Minimum N/A N/A Hemostasis Achieved: Pressure N/A N/A Procedural Pain: 0 N/A N/A Post Procedural Pain: 0 N/A N/A Debridement Treatment Procedure was tolerated well N/A N/A Response: Post Debridement 0.6x0.8x0.2 N/A N/A Measurements L x W x D (cm) Post Debridement Volume: 0.075 N/A N/A (cm) Periwound Skin Texture: Callus: Yes N/A N/A Excoriation:  No Induration: No Crepitus: No Rash: No Scarring: No Periwound Skin Moisture: Maceration: No N/A N/A Dry/Scaly: No Periwound Skin Color: Atrophie Blanche: No N/A N/A Cyanosis: No Ecchymosis: No Erythema: No Hemosiderin Staining: No Mottled: No Pallor: No Rubor: No Temperature: No Abnormality N/A N/A Tenderness on Palpation: No N/A N/A Wound Preparation: Ulcer Cleansing: N/A N/A Rinsed/Irrigated with Saline Topical Anesthetic Applied: Other: lidocaine 4% Procedures Performed: Debridement N/A N/A Treatment Notes Electronic Signature(s) Signed: 01/17/2018 10:26:35 AM By: Lawanda Cousins Entered By: Lawanda Cousins on 01/17/2018 10:26:35 Timothy Gibson, Timothy Gibson ([id number]) -------------------------------------------------------------------------------- [place] Details Patient Name: Timothy Gibson Date of Service: 01/17/2018 9:30 AM Medical Record Number: [id number] Patient Account Number: [id number] Date of Birth/Sex: 03-23-1951 (67 y.o. M) Treating RN: Ahmed Prima Primary Care Malley Hauter: PATIENT, NO Other Clinician: Referring Adriell Polansky: Konrad Felix Treating Sophonie Goforth/Extender: Cathie Olden in Treatment: 7 Active Inactive ` Abuse / Safety / Falls / Self Care Management Nursing Diagnoses: Potential for falls Goals: Patient will remain injury free related to falls Date Initiated: 11/27/2017 Target Resolution Date: 12/14/2017 Goal Status: Active Interventions: Assess fall risk on admission and as needed Notes: ` Nutrition Nursing Diagnoses: Impaired glucose control: actual or potential Goals: Patient/caregiver verbalizes understanding of need to maintain therapeutic glucose control per primary care physician Date Initiated: 11/27/2017 Target Resolution Date: 12/28/2017 Goal Status: Active Interventions: Provide education on elevated blood sugars and impact on wound healing Notes: ` Orientation to the Wound Care Program Nursing  Diagnoses: Knowledge deficit related to the wound healing center program Goals: Patient/caregiver will verbalize understanding of the [place] Program Date Initiated: 11/27/2017 Target Resolution Date: 12/28/2017 Goal Status: Active Interventions: Timothy Gibson, Timothy Gibson ([id number]) Provide education on orientation to the wound center Notes: ` Soft Tissue Infection Nursing Diagnoses: Potential for infection: soft tissue Goals: Patient will remain free of wound infection Date Initiated: 11/27/2017 Target Resolution Date: 12/28/2017 Goal Status: Active Interventions: Assess signs and symptoms of infection every visit Treatment Activities: Systemic antibiotics : 11/27/2017 Notes: ` Wound/Skin Impairment Nursing Diagnoses: Impaired tissue integrity Goals: Ulcer/skin breakdown will have a volume reduction of 80% by week 12 Date Initiated: 11/27/2017 Target Resolution Date: 02/27/2018 Goal Status: Active Interventions: Assess patient/caregiver ability to perform ulcer/skin care regimen upon admission and as needed Treatment Activities: Referred to DME Josep Luviano for dressing supplies : 11/27/2017 Skin care regimen initiated : 11/27/2017 Notes: Electronic Signature(s) Signed: 01/18/2018 11:31:29 AM By: Alric Quan Entered By: Alric Quan on 01/17/2018 10:22:16 Timothy Gibson ([id number]) -------------------------------------------------------------------------------- Pain Assessment Details Patient  Name: Timothy Gibson Date of Service: 01/17/2018 9:30 AM Medical Record Number: [id number] Patient Account Number: [id number] Date of Birth/Sex: September 20, 1950 (67 y.o. M) Treating RN: Montey Hora Primary Care Troyce Gieske: PATIENT, NO Other Clinician: Referring Nayely Dingus: Konrad Felix Treating Kaelin Holford/Extender: Cathie Olden in Treatment: 7 Active Problems Location of Pain Severity and Description of Pain Patient Has Paino No Site Locations Pain Management  and Medication Current Pain Management: Electronic Signature(s) Signed: 01/17/2018 5:06:29 PM By: Montey Hora Entered By: Montey Hora on 01/17/2018 09:41:08 Timothy Gibson ([id number]) -------------------------------------------------------------------------------- Patient/Caregiver Education Details Patient Name: Timothy Gibson Date of Service: 01/17/2018 9:30 AM Medical Record Number: [id number] Patient Account Number: [id number] Date of Birth/Gender: 01/10/1951 (67 y.o. M) Treating RN: Ahmed Prima Primary Care Physician: PATIENT, NO Other Clinician: Referring Physician: Konrad Felix Treating Physician/Extender: Cathie Olden in Treatment: 7 Education Assessment Education Provided To: Patient Education Topics Provided Wound/Skin Impairment: Handouts: Caring for Your Ulcer, Skin Care Do's and Dont's, Other: change dressing as ordered Methods: Demonstration, Explain/Verbal Responses: State content correctly Electronic Signature(s) Signed: 01/18/2018 11:31:29 AM By: Alric Quan Entered By: Alric Quan on 01/17/2018 10:32:23 Timothy Gibson ([id number]) -------------------------------------------------------------------------------- Wound Assessment Details Patient Name: Timothy Gibson Date of Service: 01/17/2018 9:30 AM Medical Record Number: [id number] Patient Account Number: [id number] Date of Birth/Sex: 01-Jul-1951 (67 y.o. M) Treating RN: Montey Hora Primary Care Eeshan Verbrugge: PATIENT, NO Other Clinician: Referring Raquon Milledge: Konrad Felix Treating Oshea Percival/Extender: Cathie Olden in Treatment: 7 Wound Status Wound Number: 1 Primary Diabetic Wound/Ulcer of the Lower Extremity Etiology: Wound Location: Right Toe Great Wound Open Wounding Event: Gradually Appeared Status: Date Acquired: 11/22/2017 Comorbid Lymphedema, Arrhythmia, Hypertension, Type II Weeks Of Treatment: 7 History: Diabetes, End Stage Renal Disease, Clustered  Wound: No Rheumatoid Arthritis, Osteoarthritis, Confinement Anxiety Photos Photo Uploaded By: Montey Hora on 01/17/2018 09:55:36 Wound Measurements Length: (cm) 0.6 Width: (cm) 0.8 Depth: (cm) 0.1 Area: (cm) 0.377 Volume: (cm) 0.038 % Reduction in Area: 84% % Reduction in Volume: 83.9% Epithelialization: Small (1-33%) Tunneling: No Undermining: No Wound Description Classification: Grade 2 Foul O Wound Margin: Well defined, not attached [place] Exudate Amount: None Present dor After Cleansing: No /Fibrino Yes Wound Bed Granulation Amount: Medium (34-66%) Exposed Structure Granulation Quality: Pink Fascia Exposed: No Necrotic Amount: Medium (34-66%) Fat Layer (Subcutaneous Tissue) Exposed: Yes Necrotic Quality: Adherent Slough Tendon Exposed: No Muscle Exposed: No Joint Exposed: No Bone Exposed: No Periwound Skin Texture Texture Color Timothy Gibson, Timothy Gibson ([id number]) No Abnormalities Noted: No No Abnormalities Noted: No Callus: Yes Atrophie Blanche: No Crepitus: No Cyanosis: No Excoriation: No Ecchymosis: No Induration: No Erythema: No Rash: No Hemosiderin Staining: No Scarring: No Mottled: No Pallor: No Moisture Rubor: No No Abnormalities Noted: No Dry / Scaly: No Temperature / Pain Maceration: No Temperature: No Abnormality Wound Preparation Ulcer Cleansing: Rinsed/Irrigated with Saline Topical Anesthetic Applied: Other: lidocaine 4%, Treatment Notes Wound #1 (Right Toe Great) 1. Cleansed with: Clean wound with Normal Saline 2. Anesthetic Topical Lidocaine 4% cream to wound bed prior to debridement 4. Dressing Applied: Santyl Ointment Saline moistened guaze 5. Secondary Dressing Applied Dry Gauze Kerlix/Conform 7. Secured with Tape Notes saline wet gauze, cover with dry gauze wrap with conform and stretch net Electronic Signature(s) Signed: 01/17/2018 5:06:29 PM By: Montey Hora Entered By: Montey Hora on 01/17/2018  09:46:16 Timothy Gibson ([id number]) -------------------------------------------------------------------------------- Vitals Details Patient Name: Timothy Gibson Date of Service: 01/17/2018 9:30 AM Medical Record Number: [id number] Patient Account Number: [id number] Date of Birth/Sex: [birth year] (67 y.o. M) Treating RN:  Montey Hora Primary Care Jvion Turgeon: PATIENT, NO Other Clinician: Referring Lain Tetterton: Konrad Felix Treating Klarissa Mcilvain/Extender: Cathie Olden in Treatment: 7 Vital Signs Time Taken: 09:42 Temperature (F): 98.3 Height (in): 76 Pulse (bpm): 85 Weight (lbs): 220 Respiratory Rate (breaths/min): 16 Body Mass Index (BMI): 26.8 Blood Pressure (mmHg): 169/88 Reference Range: 80 - 120 mg / dl Electronic Signature(s) Signed: 01/17/2018 5:06:29 PM By: Montey Hora Entered By: Montey Hora on 01/17/2018 09:43:27

## 2018-01-19 NOTE — Progress Notes (Signed)
PRUDENCIO, VELAZCO ([id number]) Visit Report for 01/17/2018 Chief Complaint Document Details Patient Name: Timothy Gibson, Timothy Gibson Date of Service: 01/17/2018 9:30 AM Medical Record Number: [id number] Patient Account Number: [id number] Date of Birth/Sex: October 08, 1950 (67 y.o. M) Treating RN: Ahmed Prima Primary Care Provider: PATIENT, NO Other Clinician: Referring Provider: Konrad Felix Treating Provider/Extender: Cathie Olden in Treatment: 7 Information Obtained from: Patient Chief Complaint Right Great Toe Electronic Signature(s) Signed: 01/17/2018 10:27:05 AM By: Lawanda Cousins Entered By: Lawanda Cousins on 01/17/2018 10:27:05 Timothy Gibson ([id number]) -------------------------------------------------------------------------------- Debridement Details Patient Name: Timothy Gibson Date of Service: 01/17/2018 9:30 AM Medical Record Number: [id number] Patient Account Number: [id number] Date of Birth/Sex: October 13, 1950 (67 y.o. M) Treating RN: Ahmed Prima Primary Care Provider: PATIENT, NO Other Clinician: Referring Provider: Konrad Felix Treating Provider/Extender: Cathie Olden in Treatment: 7 Debridement Performed for Wound #1 Right Toe Great Assessment: Performed By: Physician Lawanda Cousins, NP Debridement Type: Debridement Severity of Tissue Pre Fat layer exposed Debridement: Pre-procedure Verification/Time Yes - 10:21 Out Taken: Start Time: 10:21 Pain Control: Lidocaine 4% Topical Solution Total Area Debrided (L x W): 0.6 (cm) x 0.8 (cm) = 0.48 (cm) Tissue and other material Viable, Non-Viable, Slough, Subcutaneous, Fibrin/Exudate, Slough debrided: Level: Skin/Subcutaneous Tissue Debridement Description: Excisional Instrument: Curette Bleeding: Minimum Hemostasis Achieved: Pressure End Time: 10:23 Procedural Pain: 0 Post Procedural Pain: 0 Response to Treatment: Procedure was tolerated well Level of Consciousness: Awake and Alert Post  Debridement Measurements of Total Wound Length: (cm) 0.6 Width: (cm) 0.8 Depth: (cm) 0.2 Volume: (cm) 0.075 Character of Wound/Ulcer Post Debridement: Requires Further Debridement Severity of Tissue Post Debridement: Fat layer exposed Post Procedure Diagnosis Same as Pre-procedure Electronic Signature(s) Signed: 01/17/2018 10:26:53 AM By: Lawanda Cousins Signed: 01/18/2018 11:31:29 AM By: Alric Quan Entered By: Lawanda Cousins on 01/17/2018 10:26:52 Timothy Gibson ([id number]) -------------------------------------------------------------------------------- HPI Details Patient Name: Timothy Gibson Date of Service: 01/17/2018 9:30 AM Medical Record Number: [id number] Patient Account Number: [id number] Date of Birth/Sex: 1951/02/16 (67 y.o. M) Treating RN: Ahmed Prima Primary Care Provider: PATIENT, NO Other Clinician: Referring Provider: Konrad Felix Treating Provider/Extender: Cathie Olden in Treatment: 7 History of Present Illness HPI Description: 11/27/17 on evaluation today patient presents for initial evaluation concerning an issue that he has been having with his right great toe which began last Thursday 11/22/17. He has a history of diabetes mellitus type to which he has had for greater than 30 years currently he is on insulin. Subsequently he also has a history of hypertension. On physical exam inspection is also appears he may have some peripheral vascular disease with his ABI's being noncompressible bilaterally. He is on dialysis as well and this is on Monday, Wednesday, and Friday. Patient was hospitalized back in January/February 2019 although this was more for stomach/back pain though they never told me exactly what was going on. This is according to the patient. Subsequently the ulcer which is between the third and fourth toe when space of the right foot as well is on the plantar aspect of the fourth toe is due to him having cleaned his toes this morning  and he states that his finger which is large split the toe tissue in between causing the wounds that we currently see. With that being said he states this has happened before I explained I would definitely recommend that he not do this any longer. He can use a small soft washcloth to clean between the toes without causing trauma. Subsequently the right great toe hatchery does show  evidence of necrotic tissue present on the surface of the wound specifically there is callous and dead skin surrounding which is trapping fluid causing problems as far as the wound is concerned. The surface of the wound does show slough although due to his vascular flow I'm not going to sharply debride this today I think I will selectively debride away the necrotic skin as well is callous from around the surrounding so this will not continue to be a moisture issue. Nonetheless the patient has no pain he does have diabetic neuropathy. 12/06/17-He is here in follow-up evaluation for a right great toe ulcer. He is voicing no complaints or concerns. He continues to infrequently/socially smokes cigars with no desire to quit. He has been compliant with offloading, using open toed surgical shoe; has been compliant using Santyl daily. He continues on clindamycin although takes it inconsistently secondary to indigestion. The plain film x-ray performed on 5/23 impression: Soft tissue swelling of the left first toe and is noted with probable irregular lucency involving distal tuft of first distal phalanx concerning for osteomyelitis. MRI may be performed for further evaluation; MRI ordered. The vascular evaluation performed on 5/24 field non-compressible ABIs bilaterally and reduced TBI bilaterally suggesting significant tibial disease. He will be referred to vascular medicine for further evaluation. 12/13/17-He is here in follow-up evaluation for right great toe ulcer. He has appointment next Thursday for the MRI and  vascular evaluation. Wound culture obtained last week grew abundant Klebsiella oxytoca (multidrug sensitivity), and abundant enteric coccus faecalis (sensitive to ampicillin). Ampicillin and Cipro were called in, along with a probiotic. In light of his appointments next Thursday he will follow-up in 2 weeks, we will continue with Santyl 12/27/17-He is here for evaluation for a right great toe ulcer. 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 He did have evaluation by vascular medicine, Dr. Ronalee Belts, on 6/13, at that appointment it was decided for him to undergo angiography with possible intervention but he refused and is still considering. If he chooses to have it done he will contact the office. He has not heard back from either ID offices that he was referred to two weeks ago: [place] and [place]. there is improvement in both appearance and measurement to the ulcer. We will extend antibiotic therapy (amoxicillin 500 every 12, Cipro 500 daily) for additional 2 weeks pending infectious disease consult, he will continue with Santyl daily. He was reminded to continue with probiotic therapy while on oral antibiotics; he denies any GI disturbance. 01/03/18-He is here in follow-up evaluation for right great toe ulcer. He is decided to not undergo any vascular intervention at this time. He was established with [facility] infectious disease (Dr. Linus Salmons) last week initiated on vancomycin and cefazolin with dialysis treatment for 6 weeks. We will continue with current treatment plan with Santyl and offloading and he will follow- up in 2 weeks 01/17/18-He is seen in follow-up evaluation for right great toe ulcer. There is improvement in appearance with less nonviable tissue present. We will continue with same treatment plan he will follow-up next week. He is tolerating IV antibiotics without any complications Electronic  Signature(s) Signed: 01/17/2018 10:27:48 AM By: Lawanda Cousins Entered By: Lawanda Cousins on 01/17/2018 10:27:47 Timothy Gibson, Timothy Gibson ([id number]) Timothy Gibson, Timothy Gibson ([id number]) -------------------------------------------------------------------------------- Physician Orders Details Patient Name: Timothy Gibson Date of Service: 01/17/2018 9:30 AM Medical Record Number: [id number] Patient Account Number: [id number] Date of Birth/Sex: 1951-03-15 (67 y.o. M)  Treating RN: Ahmed Prima Primary Care Provider: PATIENT, NO Other Clinician: Referring Provider: Konrad Felix Treating Provider/Extender: Cathie Olden in Treatment: 7 Verbal / Phone Orders: Yes Clinician: Carolyne Fiscal, Debi Read Back and Verified: Yes Diagnosis Coding Wound Cleansing Wound #1 Right Toe Great o Clean wound with Normal Saline. o Cleanse wound with mild soap and water Anesthetic (add to Medication List) Wound #1 Right Toe Great o Topical Lidocaine 4% cream applied to wound bed prior to debridement (In Clinic Only). Primary Wound Dressing Wound #1 Right Toe Great o Saline moistened gauze - only dampened o Santyl Ointment Secondary Dressing Wound #1 Right Toe Great o Gauze and Kerlix/Conform Dressing Change Frequency Wound #1 Right Toe Great o Change dressing every day. Follow-up Appointments Wound #1 Right Toe Great o Return Appointment in 1 week. Edema Control Wound #1 Right Toe Great o Elevate legs to the level of the heart and pump ankles as often as possible Off-Loading Wound #1 Right Toe Great o Open toe surgical shoe to: Additional Orders / Instructions Wound #1 Right Toe Great o Increase protein intake. Medications-please add to medication list. Wound #1 Right Toe MOO, GRAVLEY. ([id number]) o Santyl Enzymatic Ointment Patient Medications Allergies: no known allergies Notifications Medication Indication Start End lidocaine DOSE 1 - topical 4 % cream -  1 cream topical Electronic Signature(s) Signed: 01/17/2018 9:10:59 PM By: Lawanda Cousins Signed: 01/18/2018 11:31:29 AM By: Alric Quan Entered By: Alric Quan on 01/17/2018 10:24:47 Timothy Gibson, Timothy Gibson ([id number]) -------------------------------------------------------------------------------- Prescription 01/17/2018 Patient Name: Timothy Gibson Provider: Lawanda Cousins NP Date of Birth: 1951-07-06 NPI#: [id number] Sex: M DEA#: [id number] Phone #: [phone] License #: Patient Address: [facility] [place], [place] [address] [address], [place], [place] [address] [id number] Allergies no known allergies Medication Medication: Route: Strength: Form: lidocaine topical 4% cream Class: TOPICAL LOCAL ANESTHETICS Dose: Frequency / Time: Indication: 1 1 cream topical Number of Refills: Number of Units: 0 Generic Substitution: Start Date: End Date: Administered at Substitution Permitted Facility: Yes Time Administered: Time Discontinued: Note to Pharmacy: Signature(s): Date(s): Electronic Signature(s) Signed: 01/17/2018 9:10:59 PM By: Lawanda Cousins Signed: 01/18/2018 11:31:29 AM By: Alric Quan Entered By: Alric Quan on 01/17/2018 10:24:48 Timothy Gibson ([id number], SEES ([id number]) --------------------------------------------------------------------------------  Problem List Details Patient Name: Timothy Gibson Date of Service: 01/17/2018 9:30 AM Medical Record Number: [id number] Patient Account Number: [id number] Date of Birth/Sex: 09-14-1950 (67 y.o. 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/27/2017 No Yes layer exposed I10 Essential (primary) hypertension 11/27/2017 No Yes E44.1 Mild protein-calorie malnutrition 11/27/2017 No Yes M86.671 Other chronic osteomyelitis, right ankle and foot 12/27/2017 No Yes Inactive Problems Resolved Problems Electronic Signature(s) Signed: 01/17/2018 10:26:26 AM By: Lawanda Cousins Entered By: Lawanda Cousins on 01/17/2018 10:26:25 Timothy Gibson ([id number]) -------------------------------------------------------------------------------- Progress Note Details Patient Name: Timothy Gibson Date of Service: 01/17/2018 9:30 AM Medical Record Number: [id number] Patient Account Number: [id number] Date of Birth/Sex: [birth year] (67 y.o. M) Treating RN: Ahmed Prima Primary Care Provider: PATIENT, NO Other Clinician: Referring Provider:  ZANETTO, JESSICA Treating Provider/Extender: Cathie Olden in Treatment: 7 Subjective Chief Complaint Information obtained from Patient Right Great Toe History of Present Illness (HPI) 11/27/17 on evaluation today patient presents for initial evaluation concerning an issue that he has been having with his right great toe which began last Thursday 11/22/17. He has a history of diabetes mellitus type to which he has had for greater than 30 years currently he is on insulin. Subsequently he also has a history of hypertension. On physical exam inspection is also appears he may have some peripheral vascular disease with his ABI's being noncompressible bilaterally. He is on dialysis as well and this is on Monday, Wednesday, and Friday. Patient was hospitalized back in January/February 2019 although this was more for stomach/back pain though they never told me exactly what was  going on. This is according to the patient. Subsequently the ulcer which is between the third and fourth toe when space of the right foot as well is on the plantar aspect of the fourth toe is due to him having cleaned his toes this morning and he states that his finger which is large split the toe tissue in between causing the wounds that we currently see. With that being said he states this has happened before I explained I would definitely recommend that he not do this any longer. He can use a small soft washcloth to clean between the toes without causing trauma. Subsequently the right great toe hatchery does show evidence of necrotic tissue present on the surface of the wound specifically there is callous and dead skin surrounding which is trapping fluid causing problems as far as the wound is concerned. The surface of the wound does show slough although due to his vascular flow I'm not going to sharply debride this today I think I will selectively debride away the necrotic skin as well is callous from around the surrounding so this will not continue to be a moisture issue. Nonetheless the patient has no pain he does have diabetic neuropathy. 12/06/17-He is here in follow-up evaluation for a right great toe ulcer. He is voicing no complaints or concerns. He continues to infrequently/socially smokes cigars with no desire to quit. He has been compliant with offloading, using open toed surgical shoe; has been compliant using Santyl daily. He continues on clindamycin although takes it inconsistently secondary to indigestion. The plain film x-ray performed on 5/23 impression: Soft tissue swelling of the left first toe and is noted with probable irregular lucency involving distal tuft of first distal phalanx concerning for osteomyelitis. MRI may be performed for further evaluation; MRI ordered. The vascular evaluation performed on 5/24 field non-compressible ABIs bilaterally and reduced TBI bilaterally  suggesting significant tibial disease. He will be referred to vascular medicine for further evaluation. 12/13/17-He is here in follow-up evaluation for right great toe ulcer. He has appointment next Thursday for the MRI and vascular evaluation. Wound culture obtained last week grew abundant Klebsiella oxytoca (multidrug sensitivity), and abundant enteric coccus faecalis (sensitive to ampicillin). Ampicillin and Cipro were called in, along with a probiotic. In light of his appointments next Thursday he will follow-up in 2 weeks, we will continue with Santyl 12/27/17-He is here for evaluation for a right great toe ulcer. 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 He did have evaluation by vascular medicine, Dr. Ronalee Belts, on 6/13, at that appointment it was decided for him to undergo angiography with possible  intervention but he refused and is still considering. If he chooses to have it done he will contact the office. He has not heard back from either ID offices that he was referred to two weeks ago: [place] and [place]. there is improvement in both appearance and measurement to the ulcer. We will extend antibiotic therapy (amoxicillin 500 every 12, Cipro 500 daily) for additional 2 weeks pending infectious disease consult, he will continue with Santyl daily. He was reminded to continue with probiotic therapy while on oral antibiotics; he denies any GI disturbance. 01/03/18-He is here in follow-up evaluation for right great toe ulcer. He is decided to not undergo any vascular intervention at this time. He was established with [facility] infectious disease (Dr. Linus Salmons) last week initiated on vancomycin and cefazolin with dialysis treatment for 6 weeks. We will continue with current treatment plan with Santyl and offloading and he will follow- up in 2 weeks 01/17/18-He is seen in follow-up evaluation for right great toe ulcer. There  is improvement in appearance with less nonviable tissue present. We will continue with same treatment plan he will follow-up next week. He is tolerating IV antibiotics without Timothy Gibson, Timothy Gibson ([id number]) any complications Patient History Information obtained from Patient. Family History Cancer - Mother,Father, Diabetes - Mother, Heart Disease - Mother, Hypertension - Mother,Father, Stroke - Father, Thyroid Problems - Mother, No family history of Kidney Disease, Lung Disease, Seizures, Tuberculosis. Social History Former smoker - cigar about 4 to 5 days, Marital Status - Divorced, Alcohol Use - Never, Drug Use - No History, Caffeine Use - Daily. Medical History Hospitalization/Surgery History - 08/11/2017, [facility], stomach and back pain. Objective Constitutional Vitals Time Taken: 9:42 AM, Height: 76 in, Weight: 220 lbs, BMI: 26.8, Temperature: 98.3 F, Pulse: 85 bpm, Respiratory Rate: 16 breaths/min, Blood Pressure: 169/88 mmHg. Integumentary (Hair, Skin) Wound #1 status is Open. Original cause of wound was Gradually Appeared. The wound is located on the Right Toe Great. The wound measures 0.6cm length x 0.8cm width x 0.1cm depth; 0.377cm^2 area and 0.038cm^3 volume. There is Fat Layer (Subcutaneous Tissue) Exposed exposed. There is no tunneling or undermining noted. There is a none present amount of drainage noted. The wound margin is well defined and not attached to the wound base. There is medium (34-66%) pink granulation within the wound bed. There is a medium (34-66%) amount of necrotic tissue within the wound bed including Adherent Slough. The periwound skin appearance exhibited: Callus. The periwound skin appearance did not exhibit: Crepitus, Excoriation, Induration, Rash, Scarring, Dry/Scaly, Maceration, Atrophie Blanche, Cyanosis, Ecchymosis, Hemosiderin Staining, Mottled, Pallor, Rubor, Erythema. Periwound temperature was noted as No Abnormality. 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 There was a Excisional Skin/Subcutaneous Tissue Debridement with a total area of 0.48 sq cm performed by Lawanda Cousins, NP. With the following instrument(s): Curette to remove Viable and Non-Viable tissue/material. Material removed includes Subcutaneous Tissue, Slough, and Fibrin/Exudate after achieving pain control using Lidocaine 4% Topical Solution. No specimens were taken. A time out was conducted at 10:21, prior to the start  of the procedure. A Minimum amount of bleeding was controlled with Pressure. The procedure was tolerated well with a pain level of 0 throughout and a pain level of 0 following the procedure. Patient s Level of Consciousness post procedure was recorded as Awake and Alert. Post Debridement Measurements: 0.6cm length x 0.8cm width x 0.2cm depth; 0.075cm^3 volume. Character of Wound/Ulcer Post Debridement requires further debridement. Severity of Tissue Post Debridement is: Fat layer exposed. Post procedure Diagnosis Wound #1: Same as Pre-Procedure Plan Wound Cleansing: Wound #1 Right Toe Great: Clean wound with Normal Saline. Cleanse wound with mild soap and water Anesthetic (add to Medication List): Wound #1 Right Toe Great: Topical Lidocaine 4% cream applied to wound bed prior to debridement (In Clinic Only). Primary Wound Dressing: Wound #1 Right Toe Great: Saline moistened gauze - only dampened Santyl  Ointment Secondary Dressing: Wound #1 Right Toe Great: Gauze and Kerlix/Conform Dressing Change Frequency: Wound #1 Right Toe Great: Change dressing every day. Follow-up Appointments: Wound #1 Right Toe Great: Return Appointment in 1 week. Edema Control: Wound #1 Right Toe Great: Elevate legs to the level of the heart and pump ankles as often as possible Off-Loading: Wound #1 Right Toe Great: Open toe surgical shoe to: Additional Orders / Instructions: AZEEZ, DUNKER ([id number]) Wound #1 Right Toe Great: Increase protein intake. Medications-please add to medication list.: Wound #1 Right Toe Great: Santyl Enzymatic Ointment The following medication(s) was prescribed: lidocaine topical 4 % cream 1 1 cream topical was prescribed at facility Electronic Signature(s) Signed: 01/17/2018 10:28:13 AM By: Lawanda Cousins Entered By: Lawanda Cousins on 01/17/2018 10:28:13 Timothy Gibson ([id number]) -------------------------------------------------------------------------------- ROS/PFSH Details Patient Name: Timothy Gibson Date of Service: 01/17/2018 9:30 AM Medical Record Number: [id number] Patient Account Number: [id number] Date of Birth/Sex: 02-20-1951 (67 y.o. M) Treating RN: Ahmed Prima Primary Care Provider: PATIENT, NO Other Clinician: Referring Provider: Konrad Felix Treating Provider/Extender: Cathie Olden in Treatment: 7 Information Obtained From Patient Wound History Eyes Medical History: Negative for: Cataracts; Glaucoma; Optic Neuritis Ear/Nose/Mouth/Throat Medical History: Negative for: Chronic sinus problems/congestion; Middle ear problems Hematologic/Lymphatic Medical History: Positive for: Lymphedema Negative for: Anemia; Hemophilia; Human Immunodeficiency Virus; Sickle Cell Disease Respiratory Medical History: Negative for: Aspiration; Asthma; Chronic Obstructive Pulmonary Disease (COPD); Pneumothorax; Sleep  Apnea; Tuberculosis Cardiovascular Medical History: Positive for: Arrhythmia; Hypertension Negative for: Congestive Heart Failure; Coronary Artery Disease; Deep Vein Thrombosis; Hypotension; Myocardial Infarction; Peripheral Arterial Disease; Peripheral Venous Disease; Phlebitis; Vasculitis Gastrointestinal Medical History: Negative for: Cirrhosis ; Colitis; Crohnos; Hepatitis A; Hepatitis B; Hepatitis C Endocrine Medical History: Positive for: Type II Diabetes Time with diabetes: 30 years Treated with: Diet Blood sugar tested every day: No Genitourinary Timothy Gibson, Timothy Gibson ([id number]) Medical History: Positive for: End Stage Renal Disease Immunological Medical History: Negative for: Lupus Erythematosus; Raynaudos Musculoskeletal Medical History: Positive for: Rheumatoid Arthritis; Osteoarthritis Negative for: Gout; Osteomyelitis Neurologic Medical History: Negative for: Dementia; Neuropathy; Quadriplegia; Paraplegia; Seizure Disorder Oncologic Medical History: Negative for: Received Chemotherapy; Received Radiation Psychiatric Medical History: Positive for: Confinement Anxiety Negative for: Anorexia/bulimia Immunizations Pneumococcal Vaccine: Received Pneumococcal Vaccination: Yes Implantable Devices Hospitalization / Surgery History Name of Hospital Purpose of Hospitalization/Surgery Date [facility] stomach and back pain 08/11/2017 Family and Social History Cancer: Yes - Mother,Father; Diabetes: Yes - Mother; Heart Disease: Yes - Mother; Hypertension: Yes - Mother,Father; Kidney Disease: No; Lung Disease: No; Seizures: No; Stroke: Yes - Father; Thyroid Problems: Yes - Mother; Tuberculosis: No; Former smoker - cigar about 4 to 5 days; Marital Status - Divorced; Alcohol Use:  Never; Drug Use: No History; Caffeine Use: Daily; Financial Concerns: No; Food, Clothing or Shelter Needs: No; Support System Lacking: No; Transportation Concerns: No; Advanced Directives: Yes (Not  Provided); Patient does not want information on Advanced Directives; Do not resuscitate: No; Living Will: Yes (Not Provided); Medical Power of Attorney: Yes (Not Provided) Physician Affirmation I have reviewed and agree with the above information. Electronic Signature(s) Signed: 01/17/2018 9:10:59 PM By: Lawanda Cousins Signed: 01/18/2018 11:31:29 AM By: Alric Quan Entered By: Lawanda Cousins on 01/17/2018 10:27:57 Timothy Gibson ([id number]) -------------------------------------------------------------------------------- [place] Details Patient Name: Timothy Gibson Date of Service: 01/17/2018 Medical Record Number: [id number] Patient Account Number: [id number] Date of Birth/Sex: [birth year] (67 y.o. 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, right ankle and foot Facility Procedures CPT4 Code: 94446190 Description: 12224 - DEB SUBQ TISSUE 20 SQ CM/< ICD-10 Diagnosis Description L97.512 Non-pressure chronic ulcer of other part of right foot with fat l E11.621 Type 2 diabetes mellitus with foot ulcer Modifier: ayer exposed Quantity: 1 Physician Procedures CPT4 Code: 1146431 Description: 11042 - WC PHYS SUBQ TISS 20 SQ CM ICD-10 Diagnosis Description L97.512 Non-pressure chronic ulcer of other part of right foot with fat l E11.621 Type 2 diabetes mellitus with foot ulcer Modifier: ayer exposed Quantity: 1 Electronic Signature(s) Signed: 01/17/2018  10:28:28 AM By: Lawanda Cousins Entered By: Lawanda Cousins on 01/17/2018 42:76:70

## 2018-01-21 DIAGNOSIS — D509 Iron deficiency anemia, unspecified: Secondary | ICD-10-CM | POA: Diagnosis not present

## 2018-01-21 DIAGNOSIS — Z5181 Encounter for therapeutic drug level monitoring: Secondary | ICD-10-CM | POA: Diagnosis not present

## 2018-01-21 DIAGNOSIS — M86171 Other acute osteomyelitis, right ankle and foot: Secondary | ICD-10-CM | POA: Diagnosis not present

## 2018-01-21 DIAGNOSIS — N186 End stage renal disease: Secondary | ICD-10-CM | POA: Diagnosis not present

## 2018-01-21 DIAGNOSIS — N2581 Secondary hyperparathyroidism of renal origin: Secondary | ICD-10-CM | POA: Diagnosis not present

## 2018-01-21 DIAGNOSIS — D631 Anemia in chronic kidney disease: Secondary | ICD-10-CM | POA: Diagnosis not present

## 2018-01-23 DIAGNOSIS — D631 Anemia in chronic kidney disease: Secondary | ICD-10-CM | POA: Diagnosis not present

## 2018-01-23 DIAGNOSIS — N186 End stage renal disease: Secondary | ICD-10-CM | POA: Diagnosis not present

## 2018-01-23 DIAGNOSIS — D509 Iron deficiency anemia, unspecified: Secondary | ICD-10-CM | POA: Diagnosis not present

## 2018-01-23 DIAGNOSIS — N2581 Secondary hyperparathyroidism of renal origin: Secondary | ICD-10-CM | POA: Diagnosis not present

## 2018-01-23 DIAGNOSIS — Z5181 Encounter for therapeutic drug level monitoring: Secondary | ICD-10-CM | POA: Diagnosis not present

## 2018-01-23 DIAGNOSIS — M86171 Other acute osteomyelitis, right ankle and foot: Secondary | ICD-10-CM | POA: Diagnosis not present

## 2018-01-24 ENCOUNTER — Encounter: Payer: Medicare Other | Admitting: Nurse Practitioner

## 2018-01-24 DIAGNOSIS — M86671 Other chronic osteomyelitis, right ankle and foot: Secondary | ICD-10-CM | POA: Diagnosis not present

## 2018-01-24 DIAGNOSIS — E1151 Type 2 diabetes mellitus with diabetic peripheral angiopathy without gangrene: Secondary | ICD-10-CM | POA: Diagnosis not present

## 2018-01-24 DIAGNOSIS — E11621 Type 2 diabetes mellitus with foot ulcer: Secondary | ICD-10-CM | POA: Diagnosis not present

## 2018-01-24 DIAGNOSIS — E1169 Type 2 diabetes mellitus with other specified complication: Secondary | ICD-10-CM | POA: Diagnosis not present

## 2018-01-24 DIAGNOSIS — L97512 Non-pressure chronic ulcer of other part of right foot with fat layer exposed: Secondary | ICD-10-CM | POA: Diagnosis not present

## 2018-01-24 DIAGNOSIS — E1122 Type 2 diabetes mellitus with diabetic chronic kidney disease: Secondary | ICD-10-CM | POA: Diagnosis not present

## 2018-01-25 DIAGNOSIS — M86171 Other acute osteomyelitis, right ankle and foot: Secondary | ICD-10-CM | POA: Diagnosis not present

## 2018-01-25 DIAGNOSIS — D509 Iron deficiency anemia, unspecified: Secondary | ICD-10-CM | POA: Diagnosis not present

## 2018-01-25 DIAGNOSIS — N186 End stage renal disease: Secondary | ICD-10-CM | POA: Diagnosis not present

## 2018-01-25 DIAGNOSIS — N2581 Secondary hyperparathyroidism of renal origin: Secondary | ICD-10-CM | POA: Diagnosis not present

## 2018-01-25 DIAGNOSIS — Z5181 Encounter for therapeutic drug level monitoring: Secondary | ICD-10-CM | POA: Diagnosis not present

## 2018-01-25 DIAGNOSIS — D631 Anemia in chronic kidney disease: Secondary | ICD-10-CM | POA: Diagnosis not present

## 2018-01-25 NOTE — Progress Notes (Signed)
Timothy Gibson, Timothy Gibson ([id number]) Visit Report for 01/24/2018 Arrival Information Details Patient Name: Timothy Gibson, Timothy Gibson Date of Service: 01/24/2018 9:00 AM Medical Record Number: [id number] Patient Account Number: [id number] Date of Birth/Sex: 07-22-1950 (67 y.o. M) Treating RN: Montey Hora Primary Care Elin Seats: PATIENT, NO Other Clinician: Referring Zahari Fazzino: Konrad Felix Treating Bowen Goyal/Extender: Cathie Olden in Treatment: 8 Visit Information History Since Last Visit Added or deleted any medications: No Patient Arrived: Ambulatory Any new allergies or adverse reactions: No Arrival Time: 09:12 Had a fall or experienced change in No Accompanied By: self activities of daily living that may affect Transfer Assistance: None risk of falls: Patient Identification Verified: Yes Signs or symptoms of abuse/neglect since last visito No Secondary Verification Process Completed: Yes Hospitalized since last visit: No Patient Requires Transmission-Based No Implantable device outside of the clinic excluding No Precautions: cellular tissue based products placed in the center Patient Has Alerts: No since last visit: Has Dressing in Place as Prescribed: Yes Pain Present Now: No Electronic Signature(s) Signed: 01/24/2018 3:30:12 PM By: Montey Hora Entered By: Montey Hora on 01/24/2018 09:12:58 Timothy Gibson ([id number]) -------------------------------------------------------------------------------- Lower Extremity Assessment Details Patient Name: Timothy Gibson Date of Service: 01/24/2018 9:00 AM Medical Record Number: [id number] Patient Account Number: [id number] Date of Birth/Sex: 30-Dec-1950 (67 y.o. M) Treating RN: Montey Hora Primary Care Notnamed Croucher: PATIENT, NO Other Clinician: Referring Zenith Lamphier: Konrad Felix Treating Martisha Toulouse/Extender: Cathie Olden in Treatment: 8 Vascular Assessment Pulses: Dorsalis Pedis Palpable: [Right:Yes] Posterior  Tibial Extremity colors, hair growth, and conditions: Extremity Color: [Right:Hyperpigmented] Hair Growth on Extremity: [Right:No] Temperature of Extremity: [Right:Warm] Capillary Refill: [Right:< 3 seconds] Toe Nail Assessment Left: Right: Thick: Yes Discolored: Yes Deformed: Yes Improper Length and Hygiene: No Electronic Signature(s) Signed: 01/24/2018 3:30:12 PM By: Montey Hora Entered By: Montey Hora on 01/24/2018 09:17:01 Timothy Gibson ([id number]) -------------------------------------------------------------------------------- Multi Wound Chart Details Patient Name: Timothy Gibson Date of Service: 01/24/2018 9:00 AM Medical Record Number: [id number] Patient Account Number: [id number] Date of Birth/Sex: 1950/11/16 (67 y.o. M) Treating RN: Ahmed Prima Primary Care Gaylon Bentz: PATIENT, NO Other Clinician: Referring Jarah Pember: Konrad Felix Treating Alley Neils/Extender: Cathie Olden in Treatment: 8 Vital Signs Height(in): 76 Pulse(bpm): 69 Weight(lbs): 220 Blood Pressure(mmHg): 179/87 Body Mass Index(BMI): 27 Temperature(F): 98.5 Respiratory Rate 18 (breaths/min): Photos: [1:No Photos] [N/A:N/A] Wound Location: [1:Right Toe Great] [N/A:N/A] Wounding Event: [1:Gradually Appeared] [N/A:N/A] Primary Etiology: [1:Diabetic Wound/Ulcer of the Lower Extremity] [N/A:N/A] Comorbid History: [1:Lymphedema, Arrhythmia, Hypertension, Type II Diabetes, End Stage Renal Disease, Rheumatoid Arthritis, Osteoarthritis, Confinement Anxiety] [N/A:N/A] Date Acquired: [1:11/22/2017] [N/A:N/A] Weeks of Treatment: [1:8] [N/A:N/A] Wound Status: [1:Open] [N/A:N/A] Measurements L x W x D [1:0.5x0.7x0.1] [N/A:N/A] (cm) Area (cm) : [1:0.275] [N/A:N/A] Volume (cm) : [1:0.027] [N/A:N/A] % Reduction in Area: [1:88.30%] [N/A:N/A] % Reduction in Volume: [1:88.60%] [N/A:N/A] Classification: [1:Grade 2] [N/A:N/A] Exudate Amount: [1:Medium] [N/A:N/A] Exudate Type: [1:Serous]  [N/A:N/A] Exudate Color: [1:amber] [N/A:N/A] Wound Margin: [1:Well defined, not attached] [N/A:N/A] Granulation Amount: [1:Medium (34-66%)] [N/A:N/A] Granulation Quality: [1:Pink] [N/A:N/A] Necrotic Amount: [1:Medium (34-66%)] [N/A:N/A] Exposed Structures: [1:Fat Layer (Subcutaneous Tissue) Exposed: Yes Fascia: No Tendon: No Muscle: No Joint: No Bone: No] [N/A:N/A] Epithelialization: [1:Small (1-33%)] [N/A:N/A] Debridement: [1:Debridement - Excisional] [N/A:N/A] Pre-procedure 09:28 N/A N/A Verification/Time Out Taken: Pain Control: Lidocaine 4% Topical Solution N/A N/A Tissue Debrided: Subcutaneous, Slough N/A N/A Level: Skin/Subcutaneous Tissue N/A N/A Debridement Area (sq cm): 0.35 N/A N/A Instrument: Curette N/A N/A Bleeding: Minimum N/A N/A Hemostasis Achieved: Pressure N/A N/A Procedural Pain: 0 N/A N/A Post Procedural Pain: 0 N/A N/A Debridement Treatment  Procedure was tolerated well N/A N/A Response: Post Debridement 0.5x0.7x0.2 N/A N/A Measurements L x W x D (cm) Post Debridement Volume: 0.055 N/A N/A (cm) Periwound Skin Texture: Callus: Yes N/A N/A Excoriation: No Induration: No Crepitus: No Rash: No Scarring: No Periwound Skin Moisture: Maceration: No N/A N/A Dry/Scaly: No Periwound Skin Color: Atrophie Blanche: No N/A N/A Cyanosis: No Ecchymosis: No Erythema: No Hemosiderin Staining: No Mottled: No Pallor: No Rubor: No Temperature: No Abnormality N/A N/A Tenderness on Palpation: No N/A N/A Wound Preparation: Ulcer Cleansing: N/A N/A Rinsed/Irrigated with Saline Topical Anesthetic Applied: Other: lidocaine 4% Procedures Performed: Debridement N/A N/A Treatment Notes Electronic Signature(s) Signed: 01/24/2018 9:35:11 AM By: Timothy Gibson Entered By: Timothy Gibson on 01/24/2018 09:35:11 Timothy Gibson, Timothy Gibson ([id number]) -------------------------------------------------------------------------------- [place] Details Patient Name:  Timothy Gibson Date of Service: 01/24/2018 9:00 AM Medical Record Number: [id number] Patient Account Number: [id number] Date of Birth/Sex: [birth year] (67 y.o. M) Treating RN: Ahmed Prima Primary Care Dorthie Santini: PATIENT, NO Other Clinician: Referring Hamp Moreland: Konrad Felix Treating Wandell Scullion/Extender: Cathie Olden in Treatment: 8 Active Inactive ` Abuse / Safety / Falls / Self Care Management Nursing Diagnoses: Potential for falls Goals: Patient will remain injury free related to falls Date Initiated: 11/27/2017 Target Resolution Date: 12/14/2017 Goal Status: Active Interventions: Assess fall risk on admission and as needed Notes: ` Nutrition Nursing Diagnoses: Impaired glucose control: actual or potential Goals: Patient/caregiver verbalizes understanding of need to maintain therapeutic glucose control per primary care physician Date Initiated: 11/27/2017 Target Resolution Date: 12/28/2017 Goal Status: Active Interventions: Provide education on elevated blood sugars and impact on wound healing Notes: ` Orientation to the Wound Care Program Nursing Diagnoses: Knowledge deficit related to the wound healing center program Goals: Patient/caregiver will verbalize understanding of the [place] Date Initiated: 11/27/2017 Target Resolution Date: 12/28/2017 Goal Status: Active Interventions: Timothy Gibson, Timothy Gibson ([id number]) Provide education on orientation to the wound center Notes: ` Soft Tissue Infection Nursing Diagnoses: Potential for infection: soft tissue Goals: Patient will remain free of wound infection Date Initiated: 11/27/2017 Target Resolution Date: 12/28/2017 Goal Status: Active Interventions: Assess signs and symptoms of infection every visit Treatment Activities: Systemic antibiotics : 11/27/2017 Notes: ` Wound/Skin Impairment Nursing Diagnoses: Impaired tissue integrity Goals: Ulcer/skin breakdown will have a volume  reduction of 80% by week 12 Date Initiated: 11/27/2017 Target Resolution Date: 02/27/2018 Goal Status: Active Interventions: Assess patient/caregiver ability to perform ulcer/skin care regimen upon admission and as needed Treatment Activities: Referred to DME Marten Iles for dressing supplies : 11/27/2017 Skin care regimen initiated : 11/27/2017 Notes: Electronic Signature(s) Signed: 01/24/2018 3:48:25 PM By: Alric Quan Entered By: Alric Quan on 01/24/2018 09:29:00 Timothy Gibson ([id number]) -------------------------------------------------------------------------------- Pain Assessment Details Patient Name: Timothy Gibson Date of Service: 01/24/2018 9:00 AM Medical Record Number: [id number] Patient Account Number: [id number] Date of Birth/Sex: 26-Aug-1950 (67 y.o. M) Treating RN: Montey Hora Primary Care Janin Kozlowski: PATIENT, NO Other Clinician: Referring Kaled Allende: Konrad Felix Treating Alexie Lanni/Extender: Cathie Olden in Treatment: 8 Active Problems Location of Pain Severity and Description of Pain Patient Has Paino No Site Locations Pain Management and Medication Current Pain Management: Electronic Signature(s) Signed: 01/24/2018 3:30:12 PM By: Montey Hora Entered By: Montey Hora on 01/24/2018 09:13:26 Timothy Gibson ([id number]) -------------------------------------------------------------------------------- Wound Assessment Details Patient Name: Timothy Gibson Date of Service: 01/24/2018 9:00 AM Medical Record Number: [id number] Patient Account Number: [id number] Date of Birth/Sex: [birth year] (67 y.o. M) Treating RN: Montey Hora Primary Care Yussef Jorge: PATIENT, NO Other Clinician: Referring Kazoua Gossen: Konrad Felix  Treating Timothy Gibson/Extender: Timothy Gibson Weeks in Treatment: 8 Wound Status Wound Number: 1 Primary Diabetic Wound/Ulcer of the Lower Extremity Etiology: Wound Location: Right Toe Great Wound Open Wounding Event:  Gradually Appeared Status: Date Acquired: 11/22/2017 Comorbid Lymphedema, Arrhythmia, Hypertension, Type II Weeks Of Treatment: 8 History: Diabetes, End Stage Renal Disease, Clustered Wound: No Rheumatoid Arthritis, Osteoarthritis, Confinement Anxiety Photos Photo Uploaded By: Montey Hora on 01/24/2018 15:10:27 Wound Measurements Length: (cm) 0.5 Width: (cm) 0.7 Depth: (cm) 0.1 Area: (cm) 0.275 Volume: (cm) 0.027 % Reduction in Area: 88.3% % Reduction in Volume: 88.6% Epithelialization: Small (1-33%) Tunneling: No Undermining: No Wound Description Classification: Grade 2 Foul O Wound Margin: Well defined, not attached Slough Exudate Amount: Medium Exudate Type: Serous Exudate Color: amber dor After Cleansing: No /Fibrino Yes Wound Bed Granulation Amount: Medium (34-66%) Exposed Structure Granulation Quality: Pink Fascia Exposed: No Necrotic Amount: Medium (34-66%) Fat Layer (Subcutaneous Tissue) Exposed: Yes Necrotic Quality: Adherent Slough Tendon Exposed: No Muscle Exposed: No Joint Exposed: No Bone Exposed: No Timothy Gibson, Timothy Gibson. ([id number]) Periwound Skin Texture Texture Color No Abnormalities Noted: No No Abnormalities Noted: No Callus: Yes Atrophie Blanche: No Crepitus: No Cyanosis: No Excoriation: No Ecchymosis: No Induration: No Erythema: No Rash: No Hemosiderin Staining: No Scarring: No Mottled: No Pallor: No Moisture Rubor: No No Abnormalities Noted: No Dry / Scaly: No Temperature / Pain Maceration: No Temperature: No Abnormality Wound Preparation Ulcer Cleansing: Rinsed/Irrigated with Saline Topical Anesthetic Applied: Other: lidocaine 4%, Electronic Signature(s) Signed: 01/24/2018 3:30:12 PM By: Montey Hora Entered By: Montey Hora on 01/24/2018 09:16:35 Timothy Gibson ([id number]) -------------------------------------------------------------------------------- [place] Details Patient Name: Timothy Gibson Date of  Service: 01/24/2018 9:00 AM Medical Record Number: [id number] Patient Account Number: [id number] Date of Birth/Sex: 1951/06/12 (67 y.o. M) Treating RN: Montey Hora Primary Care [name] [name]: PATIENT, NO Other Clinician: Referring Spirit Wernli: Konrad Felix Treating Ravonda Brecheen/Extender: Cathie Olden in Treatment: 8 Vital Signs Time Taken: 09:13 Temperature (F): 98.5 Height (in): 76 Pulse (bpm): 83 Weight (lbs): 220 Respiratory Rate (breaths/min): 18 Body Mass Index (BMI): 26.8 Blood Pressure (mmHg): 179/87 Reference Range: 80 - 120 mg / dl Electronic Signature(s) Signed: 01/24/2018 3:30:12 PM By: Montey Hora Entered By: Montey Hora on 01/24/2018 09:14:18

## 2018-01-25 NOTE — Progress Notes (Addendum)
Timothy Gibson, Timothy Gibson ([id number]) Visit Report for 01/24/2018 Chief Complaint Document Details Patient Name: Timothy Gibson, Timothy Gibson Date of Service: 01/24/2018 9:00 AM Medical Record Number: [id number] Patient Account Number: [id number] Date of Birth/Sex: 1951-02-12 (67 y.o. M) Treating RN: Ahmed Prima Primary Care Provider: PATIENT, NO Other Clinician: Referring Provider: Konrad Felix Treating Provider/Extender: Cathie Olden in Treatment: 8 Information Obtained from: Patient Chief Complaint Right Great Toe Electronic Signature(s) Signed: 01/24/2018 9:35:24 AM By: Lawanda Cousins Entered By: Lawanda Cousins on 01/24/2018 09:35:24 Timothy Gibson ([id number]) -------------------------------------------------------------------------------- Debridement Details Patient Name: Timothy Gibson Date of Service: 01/24/2018 9:00 AM Medical Record Number: [id number] Patient Account Number: [id number] Date of Birth/Sex: 05-12-1951 (67 y.o. M) Treating RN: Ahmed Prima Primary Care Provider: PATIENT, NO Other Clinician: Referring Provider: Konrad Felix Treating Provider/Extender: Cathie Olden in Treatment: 8 Debridement Performed for Wound #1 Right Toe Great Assessment: Performed By: Physician Lawanda Cousins, NP Debridement Type: Debridement Severity of Tissue Pre Fat layer exposed Debridement: Pre-procedure Verification/Time Yes - 09:28 Out Taken: Start Time: 09:28 Pain Control: Lidocaine 4% Topical Solution Total Area Debrided (L x W): 0.5 (cm) x 0.7 (cm) = 0.35 (cm) Tissue and other material Viable, Non-Viable, Slough, Subcutaneous, Fibrin/Exudate, Slough debrided: Level: Skin/Subcutaneous Tissue Debridement Description: Excisional Instrument: Curette Bleeding: Minimum Hemostasis Achieved: Pressure End Time: 09:30 Procedural Pain: 0 Post Procedural Pain: 0 Response to Treatment: Procedure was tolerated well Level of Consciousness: Awake and Alert Post  Debridement Measurements of Total Wound Length: (cm) 0.5 Width: (cm) 0.7 Depth: (cm) 0.2 Volume: (cm) 0.055 Character of Wound/Ulcer Post Debridement: Requires Further Debridement Severity of Tissue Post Debridement: Fat layer exposed Post Procedure Diagnosis Same as Pre-procedure Electronic Signature(s) Signed: 01/24/2018 3:48:25 PM By: Alric Quan Signed: 01/24/2018 4:35:32 PM By: Lawanda Cousins Entered By: Alric Quan on 01/24/2018 09:31:22 Timothy Gibson ([id number]) -------------------------------------------------------------------------------- HPI Details Patient Name: Timothy Gibson Date of Service: 01/24/2018 9:00 AM Medical Record Number: [id number] Patient Account Number: [id number] Date of Birth/Sex: [birth year] (67 y.o. M) Treating RN: Ahmed Prima Primary Care Provider: PATIENT, NO Other Clinician: Referring Provider: Konrad Felix Treating Provider/Extender: Cathie Olden in Treatment: 8 History of Present Illness HPI Description: 11/27/17 on evaluation today patient presents for initial evaluation concerning an issue that he has been having with his right great toe which began last Thursday 11/22/17. He has a history of diabetes mellitus type to which he has had for greater than 30 years currently he is on insulin. Subsequently he also has a history of hypertension. On physical exam inspection is also appears he may have some peripheral vascular disease with his ABI's being noncompressible bilaterally. He is on dialysis as well and this is on Monday, Wednesday, and Friday. Patient was hospitalized back in January/February 2019 although this was more for stomach/back pain though they never told me exactly what was going on. This is according to the patient. Subsequently the ulcer which is between the third and fourth toe when space of the right foot as well is on the plantar aspect of the fourth toe is due to him having cleaned his toes this  morning and he states that his finger which is large split the toe tissue in between causing the wounds that we currently see. With that being said he states this has happened before I explained I would definitely recommend that he not do this any longer. He can use a small soft washcloth to clean between the toes without causing trauma. Subsequently the right great toe hatchery does show  evidence of necrotic tissue present on the surface of the wound specifically there is callous and dead skin surrounding which is trapping fluid causing problems as far as the wound is concerned. The surface of the wound does show slough although due to his vascular flow I'm not going to sharply debride this today I think I will selectively debride away the necrotic skin as well is callous from around the surrounding so this will not continue to be a moisture issue. Nonetheless the patient has no pain he does have diabetic neuropathy. 12/06/17-He is here in follow-up evaluation for a right great toe ulcer. He is voicing no complaints or concerns. He continues to infrequently/socially smokes cigars with no desire to quit. He has been compliant with offloading, using open toed surgical shoe; has been compliant using Santyl daily. He continues on clindamycin although takes it inconsistently secondary to indigestion. The plain film x-ray performed on 5/23 impression: Soft tissue swelling of the left first toe and is noted with probable irregular lucency involving distal tuft of first distal phalanx concerning for osteomyelitis. MRI may be performed for further evaluation; MRI ordered. The vascular evaluation performed on 5/24 field non-compressible ABIs bilaterally and reduced TBI bilaterally suggesting significant tibial disease. He will be referred to vascular medicine for further evaluation. 12/13/17-He is here in follow-up evaluation for right great toe ulcer. He has appointment next Thursday for the MRI and  vascular evaluation. Wound culture obtained last week grew abundant Klebsiella oxytoca (multidrug sensitivity), and abundant enteric coccus faecalis (sensitive to ampicillin). Ampicillin and Cipro were called in, along with a probiotic. In light of his appointments next Thursday he will follow-up in 2 weeks, we will continue with Santyl 12/27/17-He is here for evaluation for a right great toe ulcer. 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 He did have evaluation by vascular medicine, Dr. Ronalee Belts, on 6/13, at that appointment it was decided for him to undergo angiography with possible intervention but he refused and is still considering. If he chooses to have it done he will contact the office. He has not heard back from either ID offices that he was referred to two weeks ago: [place] and [place]. there is improvement in both appearance and measurement to the ulcer. We will extend antibiotic therapy (amoxicillin 500 every 12, Cipro 500 daily) for additional 2 weeks pending infectious disease consult, he will continue with Santyl daily. He was reminded to continue with probiotic therapy while on oral antibiotics; he denies any GI disturbance. 01/03/18-He is here in follow-up evaluation for right great toe ulcer. He is decided to not undergo any vascular intervention at this time. He was established with [facility] infectious disease (Dr. Linus Salmons) last week initiated on vancomycin and cefazolin with dialysis treatment for 6 weeks. We will continue with current treatment plan with Santyl and offloading and he will follow- up in 2 weeks 01/17/18-He is seen in follow-up evaluation for right great toe ulcer. There is improvement in appearance with less nonviable tissue present. We will continue with same treatment plan he will follow-up next week. He is tolerating IV antibiotics without any complications 7/41/28-NO is seen in  follow up evaluation for right great toe ulcer. There continues to be improvement. He is tolerating IV antibiotics with hemodialysis, completion date of 7/31. We will transition to college and and follow-up next week Electronic Signature(s) Timothy Gibson, Timothy Gibson ([id number]) Signed: 01/24/2018 10:07:34 AM By: Lawanda Cousins Previous Signature: 01/24/2018  9:37:48 AM Version By: Lawanda Cousins Entered By: Lawanda Cousins on 01/24/2018 10:07:33 Timothy Gibson ([id number]) -------------------------------------------------------------------------------- Physician Orders Details Patient Name: Timothy Gibson Date of Service: 01/24/2018 9:00 AM Medical Record Number: [id number] Patient Account Number: [id number] Date of Birth/Sex: July 03, 1951 (67 y.o. M) Treating RN: Ahmed Prima Primary Care Provider: PATIENT, NO Other Clinician: Referring Provider: Konrad Felix Treating Provider/Extender: Cathie Olden in Treatment: 8 Verbal / Phone Orders: Yes Clinician: Carolyne Fiscal, Debi Read Back and Verified: Yes Diagnosis Coding Wound Cleansing Wound #1 Right Toe Great o Clean wound with Normal Saline. o Cleanse wound with mild soap and water Anesthetic (add to Medication List) Wound #1 Right Toe Great o Topical Lidocaine 4% cream applied to wound bed prior to debridement (In Clinic Only). Primary Wound Dressing Wound #1 Right Toe Great o Silver Collagen - lightly moisten with saline Secondary Dressing Wound #1 Right Toe Great o Gauze and Kerlix/Conform Dressing Change Frequency Wound #1 Right Toe Great o Change dressing every day. Follow-up Appointments Wound #1 Right Toe Great o Return Appointment in 1 week. Edema Control Wound #1 Right Toe Great o Elevate legs to the level of the heart and pump ankles as often as possible Off-Loading Wound #1 Right Toe Great o Open toe surgical shoe to: Additional Orders / Instructions Wound #1 Right Toe Great o Increase  protein intake. Medications-please add to medication list. Wound #1 Right Toe Great o [address] [place], [place] ([id number]) Patient Medications Allergies: no known allergies Notifications Medication Indication Start End lidocaine DOSE 1 - topical 4 % cream - 1 cream topical Electronic Signature(s) Signed: 01/24/2018 3:48:25 PM By: Alric Quan Signed: 01/24/2018 4:35:32 PM By: Lawanda Cousins Entered By: Alric Quan on 01/24/2018 09:30:11 Timothy Gibson, Timothy Gibson ([id number]) -------------------------------------------------------------------------------- Prescription 01/24/2018 Patient Name: Timothy Gibson Provider: Lawanda Cousins NP Date of Birth: 11/06/1950 NPI#: [id number] Sex: M DEA#: [id number] Phone #: [phone] License #: Patient Address: [facility] [place], [place] [address] [address] [place], [place] [address] [phone] Allergies no known allergies Medication Medication: Route: Strength: Form: lidocaine topical 4% cream Class: TOPICAL LOCAL ANESTHETICS Dose: Frequency / Time: Indication: 1 1 cream topical Number of Refills: Number of Units: 0 Generic Substitution: Start Date: End Date: Administered at Substitution Permitted Facility: Yes Time Administered: Time Discontinued: Note to Pharmacy: Signature(s): Date(s): Electronic Signature(s) Signed: 01/24/2018 3:48:25 PM By: Alric Quan Signed: 01/24/2018 4:35:32 PM By: Lawanda Cousins Entered By: Alric Quan on 01/24/2018 09:30:11 Timothy Gibson, Timothy Gibson ([id number]) Timothy Gibson, Timothy Gibson ([id number]) --------------------------------------------------------------------------------  Problem List Details Patient Name: Timothy Gibson Date of Service: 01/24/2018 9:00 AM Medical Record Number: [id number] Patient Account Number: [id number] Date of Birth/Sex: 02-Oct-1950 (67 y.o.  M) Treating RN: Ahmed Prima Primary Care Provider: PATIENT, NO Other Clinician: Referring Provider: Konrad Felix Treating Provider/Extender: Cathie Olden in Treatment: 8 Active Problems ICD-10 Evaluated Encounter Code Description Active Date Today Diagnosis L97.512 Non-pressure chronic ulcer of other part of right foot with fat 11/27/2017 No Yes layer exposed E11.621 Type 2 diabetes mellitus with foot ulcer 11/27/2017 No Yes I70.235 Atherosclerosis of native arteries of right leg with ulceration of 12/06/2017 No Yes other part of foot I10 Essential (primary) hypertension 11/27/2017 No Yes E44.1 Mild protein-calorie malnutrition 11/27/2017 No Yes M86.671 Other chronic osteomyelitis, right ankle and foot 12/27/2017 No Yes Inactive Problems Resolved Problems Electronic Signature(s) Signed: 01/24/2018 9:35:05 AM By: Lawanda Cousins Entered By: Lawanda Cousins on 01/24/2018 09:35:04 Timothy Gibson ([id number]) -------------------------------------------------------------------------------- Progress Note Details Patient Name: Timothy Gibson, Timothy Gibson Date of Service: 01/24/2018 9:00 AM Medical Record Number: [id number] Patient Account Number: [id number] Date of Birth/Sex: 12/19/1950 (67 y.o. M) Treating RN: Ahmed Prima Primary Care Provider: PATIENT, NO Other Clinician: Referring Provider: Konrad Felix Treating Provider/Extender: Cathie Olden in Treatment: 8 Subjective Chief Complaint Information obtained from Patient Right Great Toe History of Present Illness (HPI) 11/27/17 on evaluation today patient presents for initial evaluation concerning an issue that he has been having with his right great toe which began last Thursday 11/22/17. He has a history of diabetes mellitus type to which he has had for greater than 30 years currently he is on insulin. Subsequently he also has a history of hypertension. On physical exam inspection is also appears he may have some  peripheral vascular disease with his ABI's being noncompressible bilaterally. He is on dialysis as well and this is on Monday, Wednesday, and Friday. Patient was hospitalized back in January/February 2019 although this was more for stomach/back pain though they never told me exactly what was going on. This is according to the patient. Subsequently the ulcer which is between the third and fourth toe when space of the right foot as well is on the plantar aspect of the fourth toe is due to him having cleaned his toes this morning and he states that his finger which is large split the toe tissue in between causing the wounds that we currently see. With that being said he states this has happened before I explained I would definitely recommend that he not do this any longer. He can use a small soft washcloth to clean between the toes without causing trauma. Subsequently the right great toe hatchery does show evidence of necrotic tissue present on the surface of the wound specifically there is callous and dead skin surrounding which is trapping fluid causing problems as far as the wound is concerned. The surface of the wound does show slough although due to his vascular flow I'm not going to sharply debride this today I think I will selectively debride away the necrotic skin as well is callous from around the surrounding so this will not continue to be a moisture issue. Nonetheless the patient has no pain he does have diabetic neuropathy. 12/06/17-He is here in follow-up evaluation for a right great toe ulcer. He is voicing no complaints or concerns. He continues to infrequently/socially smokes cigars with no desire to quit. He has been compliant with offloading, using open toed surgical shoe; has been compliant using Santyl daily. He continues on clindamycin although takes it inconsistently secondary to indigestion. The plain film x-ray performed on 5/23 impression: Soft tissue swelling of the left first  toe and is noted with probable irregular lucency involving distal tuft of first distal phalanx concerning for osteomyelitis. MRI may be performed for further evaluation; MRI ordered. The vascular evaluation performed on 5/24 field non-compressible ABIs bilaterally and reduced TBI bilaterally suggesting significant tibial disease. He will be referred to vascular medicine for further evaluation. 12/13/17-He is here in follow-up evaluation for right great toe ulcer. He has appointment next Thursday for the MRI and vascular evaluation. Wound culture obtained last week grew abundant Klebsiella oxytoca (multidrug sensitivity), and abundant enteric coccus faecalis (sensitive to ampicillin). Ampicillin and Cipro were called in, along with a probiotic. In light of his appointments next Thursday he will follow-up in 2 weeks, we will continue with Santyl 12/27/17-He is here for evaluation for a right great toe  ulcer. 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 He did have evaluation by vascular medicine, Dr. Ronalee Belts, on 6/13, at that appointment it was decided for him to undergo angiography with possible intervention but he refused and is still considering. If he chooses to have it done he will contact the office. He has not heard back from either ID offices that he was referred to two weeks ago: [place] and [place]. there is improvement in both appearance and measurement to the ulcer. We will extend antibiotic therapy (amoxicillin 500 every 12, Cipro 500 daily) for additional 2 weeks pending infectious disease consult, he will continue with Santyl daily. He was reminded to continue with probiotic therapy while on oral antibiotics; he denies any GI disturbance. 01/03/18-He is here in follow-up evaluation for right great toe ulcer. He is decided to not undergo any vascular intervention at this time. He was established with [facility]  infectious disease (Dr. Linus Salmons) last week initiated on vancomycin and cefazolin with dialysis treatment for 6 weeks. We will continue with current treatment plan with Santyl and offloading and he will follow- up in 2 weeks 01/17/18-He is seen in follow-up evaluation for right great toe ulcer. There is improvement in appearance with less nonviable tissue present. We will continue with same treatment plan he will follow-up next week. He is tolerating IV antibiotics without Timothy Gibson, BOENING. ([id number]) any complications 6/96/29-BM is seen in follow up evaluation for right great toe ulcer. There continues to be improvement. He is tolerating IV antibiotics with hemodialysis, completion date of 7/31. We will transition to college and and follow-up next week Patient History Information obtained from Patient. Family History Cancer - Mother,Father, Diabetes - Mother, Heart Disease - Mother, Hypertension - Mother,Father, Stroke - Father, Thyroid Problems - Mother, No family history of Kidney Disease, Lung Disease, Seizures, Tuberculosis. Social History Former smoker - cigar about 4 to 5 days, Marital Status - Divorced, Alcohol Use - Never, Drug Use - No History, Caffeine Use - Daily. Medical History Hospitalization/Surgery History - 08/11/2017, [facility], stomach and back pain. Objective Constitutional Vitals Time Taken: 9:13 AM, Height: 76 in, Weight: 220 lbs, BMI: 26.8, Temperature: 98.5 F, Pulse: 83 bpm, Respiratory Rate: 18 breaths/min, Blood Pressure: 179/87 mmHg. Integumentary (Hair, Skin) Wound #1 status is Open. Original cause of wound was Gradually Appeared. The wound is located on the Right Toe Great. The wound measures 0.5cm length x 0.7cm width x 0.1cm depth; 0.275cm^2 area and 0.027cm^3 volume. There is Fat Layer (Subcutaneous Tissue) Exposed exposed. There is no tunneling or undermining noted. There is a medium amount of serous drainage noted. The wound margin is well defined and not  attached to the wound base. There is medium (34-66%) pink granulation within the wound bed. There is a medium (34-66%) amount of necrotic tissue within the wound bed including Adherent Slough. The periwound skin appearance exhibited: Callus. The periwound skin appearance did not exhibit: Crepitus, Excoriation, Induration, Rash, Scarring, Dry/Scaly, Maceration, Atrophie Blanche, Cyanosis, Ecchymosis, Hemosiderin Staining, Mottled, Pallor, Rubor, Erythema. Periwound temperature was noted as No Abnormality. Assessment Active Problems ICD-10 Non-pressure chronic ulcer of other part of right foot with fat layer exposed Type 2 diabetes mellitus with foot ulcer Timothy Gibson, Timothy Gibson ([id number]) Atherosclerosis of native arteries of right leg with ulceration of other part of foot Essential (primary) hypertension Mild protein-calorie malnutrition Other chronic osteomyelitis, right ankle and foot Procedures Wound #1 Pre-procedure diagnosis of Wound #1 is a Diabetic  Wound/Ulcer of the Lower Extremity located on the Right Toe Great .Severity of Tissue Pre Debridement is: Fat layer exposed. There was a Excisional Skin/Subcutaneous Tissue Debridement with a total area of 0.35 sq cm performed by Lawanda Cousins, NP. With the following instrument(s): Curette to remove Viable and Non-Viable tissue/material. Material removed includes Subcutaneous Tissue, Slough, and Fibrin/Exudate after achieving pain control using Lidocaine 4% Topical Solution. No specimens were taken. A time out was conducted at 09:28, prior to the start of the procedure. A Minimum amount of bleeding was controlled with Pressure. The procedure was tolerated well with a pain level of 0 throughout and a pain level of 0 following the procedure. Patient s Level of Consciousness post procedure was recorded as Awake and Alert. Post Debridement Measurements: 0.5cm length x 0.7cm width x 0.2cm depth; 0.055cm^3 volume. Character of Wound/Ulcer Post  Debridement requires further debridement. Severity of Tissue Post Debridement is: Fat layer exposed. Post procedure Diagnosis Wound #1: Same as Pre-Procedure Plan Wound Cleansing: Wound #1 Right Toe Great: Clean wound with Normal Saline. Cleanse wound with mild soap and water Anesthetic (add to Medication List): Wound #1 Right Toe Great: Topical Lidocaine 4% cream applied to wound bed prior to debridement (In Clinic Only). Primary Wound Dressing: Wound #1 Right Toe Great: Silver Collagen - lightly moisten with saline Secondary Dressing: Wound #1 Right Toe Great: Gauze and Kerlix/Conform Dressing Change Frequency: Wound #1 Right Toe Great: Change dressing every day. Follow-up Appointments: Wound #1 Right Toe Great: Return Appointment in 1 week. Edema Control: Wound #1 Right Toe Great: Elevate legs to the level of the heart and pump ankles as often as possible Off-Loading: Wound #1 Right Toe Great: Open toe surgical shoe to: Timothy Gibson, Timothy Gibson ([id number]) Additional Orders / Instructions: Wound #1 Right Toe Great: Increase protein intake. Medications-please add to medication list.: Wound #1 Right Toe Great: Santyl Enzymatic Ointment The following medication(s) was prescribed: lidocaine topical 4 % cream 1 1 cream topical was prescribed at facility Electronic Signature(s) Signed: 01/24/2018 4:38:08 PM By: Lawanda Cousins Previous Signature: 01/24/2018 9:41:55 AM Version By: Lawanda Cousins Entered By: Lawanda Cousins on 01/24/2018 16:38:08 Timothy Gibson ([id number]) -------------------------------------------------------------------------------- ROS/PFSH Details Patient Name: Timothy Gibson Date of Service: 01/24/2018 9:00 AM Medical Record Number: [id number] Patient Account Number: [id number] Date of Birth/Sex: Aug 03, 1950 (67 y.o. M) Treating RN: Ahmed Prima Primary Care Provider: PATIENT, NO Other Clinician: Referring Provider: Konrad Felix Treating  Provider/Extender: Cathie Olden in Treatment: 8 Information Obtained From Patient Wound History Eyes Medical History: Negative for: Cataracts; Glaucoma; Optic Neuritis Ear/Nose/Mouth/Throat Medical History: Negative for: Chronic sinus problems/congestion; Middle ear problems Hematologic/Lymphatic Medical History: Positive for: Lymphedema Negative for: Anemia; Hemophilia; Human Immunodeficiency Virus; Sickle Cell Disease Respiratory Medical History: Negative for: Aspiration; Asthma; Chronic Obstructive Pulmonary Disease (COPD); Pneumothorax; Sleep Apnea; Tuberculosis Cardiovascular Medical History: Positive for: Arrhythmia; Hypertension Negative for: Congestive Heart Failure; Coronary Artery Disease; Deep Vein Thrombosis; Hypotension; Myocardial Infarction; Peripheral Arterial Disease; Peripheral Venous Disease; Phlebitis; Vasculitis Gastrointestinal Medical History: Negative for: Cirrhosis ; Colitis; Crohnos; Hepatitis A; Hepatitis B; Hepatitis C Endocrine Medical History: Positive for: Type II Diabetes Time with diabetes: 30 years Treated with: Diet Blood sugar tested every day: No Genitourinary Timothy Gibson, HOLAWAY ([id number]) Medical History: Positive for: End Stage Renal Disease Immunological Medical History: Negative for: Lupus Erythematosus; Raynaudos Musculoskeletal Medical History: Positive for: Rheumatoid Arthritis; Osteoarthritis Negative for: Gout; Osteomyelitis Neurologic Medical History: Negative for: Dementia; Neuropathy; Quadriplegia; Paraplegia; Seizure Disorder Oncologic Medical History: Negative for: Received Chemotherapy; Received Radiation  Psychiatric Medical History: Positive for: Confinement Anxiety Negative for: Anorexia/bulimia Immunizations Pneumococcal Vaccine: Received Pneumococcal Vaccination: Yes Implantable Devices Hospitalization / Surgery History Name of Hospital Purpose of Hospitalization/Surgery Date [facility] stomach and  back pain 08/11/2017 Family and Social History Cancer: Yes - Mother,Father; Diabetes: Yes - Mother; Heart Disease: Yes - Mother; Hypertension: Yes - Mother,Father; Kidney Disease: No; Lung Disease: No; Seizures: No; Stroke: Yes - Father; Thyroid Problems: Yes - Mother; Tuberculosis: No; Former smoker - cigar about 4 to 5 days; Marital Status - Divorced; Alcohol Use: Never; Drug Use: No History; Caffeine Use: Daily; Financial Concerns: No; Food, Clothing or Shelter Needs: No; Support System Lacking: No; Transportation Concerns: No; Advanced Directives: Yes (Not Provided); Patient does not want information on Advanced Directives; Do not resuscitate: No; Living Will: Yes (Not Provided); Medical Power of Attorney: Yes (Not Provided) Physician Affirmation I have reviewed and agree with the above information. Electronic Signature(s) Signed: 01/24/2018 3:48:25 PM By: Alric Quan Signed: 01/24/2018 4:35:32 PM By: Lawanda Cousins Entered By: Lawanda Cousins on 01/24/2018 09:37:57 DEZMAN, GRANDA ([id number]) -------------------------------------------------------------------------------- [place] Details Patient Name: Timothy Gibson Date of Service: 01/24/2018 Medical Record Number: [id number] Patient Account Number: [id number] Date of Birth/Sex: 1951/06/16 (67 y.o. 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, right ankle and foot Facility Procedures CPT4 Code:  76720947 Description: 09628 - DEB SUBQ TISSUE 20 SQ CM/< ICD-10 Diagnosis Description 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

## 2018-01-28 DIAGNOSIS — N2581 Secondary hyperparathyroidism of renal origin: Secondary | ICD-10-CM | POA: Diagnosis not present

## 2018-01-28 DIAGNOSIS — D509 Iron deficiency anemia, unspecified: Secondary | ICD-10-CM | POA: Diagnosis not present

## 2018-01-28 DIAGNOSIS — D631 Anemia in chronic kidney disease: Secondary | ICD-10-CM | POA: Diagnosis not present

## 2018-01-28 DIAGNOSIS — N186 End stage renal disease: Secondary | ICD-10-CM | POA: Diagnosis not present

## 2018-01-28 DIAGNOSIS — Z5181 Encounter for therapeutic drug level monitoring: Secondary | ICD-10-CM | POA: Diagnosis not present

## 2018-01-28 DIAGNOSIS — M86171 Other acute osteomyelitis, right ankle and foot: Secondary | ICD-10-CM | POA: Diagnosis not present

## 2018-01-30 DIAGNOSIS — D509 Iron deficiency anemia, unspecified: Secondary | ICD-10-CM | POA: Diagnosis not present

## 2018-01-30 DIAGNOSIS — N186 End stage renal disease: Secondary | ICD-10-CM | POA: Diagnosis not present

## 2018-01-30 DIAGNOSIS — D631 Anemia in chronic kidney disease: Secondary | ICD-10-CM | POA: Diagnosis not present

## 2018-01-30 DIAGNOSIS — M86171 Other acute osteomyelitis, right ankle and foot: Secondary | ICD-10-CM | POA: Diagnosis not present

## 2018-01-30 DIAGNOSIS — N2581 Secondary hyperparathyroidism of renal origin: Secondary | ICD-10-CM | POA: Diagnosis not present

## 2018-01-30 DIAGNOSIS — Z5181 Encounter for therapeutic drug level monitoring: Secondary | ICD-10-CM | POA: Diagnosis not present

## 2018-01-31 ENCOUNTER — Ambulatory Visit: Payer: Medicare Other | Admitting: Nurse Practitioner

## 2018-01-31 DIAGNOSIS — L97512 Non-pressure chronic ulcer of other part of right foot with fat layer exposed: Secondary | ICD-10-CM | POA: Diagnosis not present

## 2018-02-01 DIAGNOSIS — D631 Anemia in chronic kidney disease: Secondary | ICD-10-CM | POA: Diagnosis not present

## 2018-02-01 DIAGNOSIS — Z5181 Encounter for therapeutic drug level monitoring: Secondary | ICD-10-CM | POA: Diagnosis not present

## 2018-02-01 DIAGNOSIS — D509 Iron deficiency anemia, unspecified: Secondary | ICD-10-CM | POA: Diagnosis not present

## 2018-02-01 DIAGNOSIS — N186 End stage renal disease: Secondary | ICD-10-CM | POA: Diagnosis not present

## 2018-02-01 DIAGNOSIS — M86171 Other acute osteomyelitis, right ankle and foot: Secondary | ICD-10-CM | POA: Diagnosis not present

## 2018-02-01 DIAGNOSIS — N2581 Secondary hyperparathyroidism of renal origin: Secondary | ICD-10-CM | POA: Diagnosis not present

## 2018-02-04 DIAGNOSIS — M86171 Other acute osteomyelitis, right ankle and foot: Secondary | ICD-10-CM | POA: Diagnosis not present

## 2018-02-04 DIAGNOSIS — N2581 Secondary hyperparathyroidism of renal origin: Secondary | ICD-10-CM | POA: Diagnosis not present

## 2018-02-04 DIAGNOSIS — Z5181 Encounter for therapeutic drug level monitoring: Secondary | ICD-10-CM | POA: Diagnosis not present

## 2018-02-04 DIAGNOSIS — D509 Iron deficiency anemia, unspecified: Secondary | ICD-10-CM | POA: Diagnosis not present

## 2018-02-04 DIAGNOSIS — N186 End stage renal disease: Secondary | ICD-10-CM | POA: Diagnosis not present

## 2018-02-04 DIAGNOSIS — D631 Anemia in chronic kidney disease: Secondary | ICD-10-CM | POA: Diagnosis not present

## 2018-02-06 DIAGNOSIS — Z992 Dependence on renal dialysis: Secondary | ICD-10-CM | POA: Diagnosis not present

## 2018-02-06 DIAGNOSIS — N186 End stage renal disease: Secondary | ICD-10-CM | POA: Diagnosis not present

## 2018-02-06 DIAGNOSIS — Z5181 Encounter for therapeutic drug level monitoring: Secondary | ICD-10-CM | POA: Diagnosis not present

## 2018-02-06 DIAGNOSIS — N2581 Secondary hyperparathyroidism of renal origin: Secondary | ICD-10-CM | POA: Diagnosis not present

## 2018-02-06 DIAGNOSIS — D509 Iron deficiency anemia, unspecified: Secondary | ICD-10-CM | POA: Diagnosis not present

## 2018-02-06 DIAGNOSIS — M86171 Other acute osteomyelitis, right ankle and foot: Secondary | ICD-10-CM | POA: Diagnosis not present

## 2018-02-06 DIAGNOSIS — D631 Anemia in chronic kidney disease: Secondary | ICD-10-CM | POA: Diagnosis not present

## 2018-02-07 ENCOUNTER — Encounter: Payer: Medicare Other | Attending: Nurse Practitioner | Admitting: Nurse Practitioner

## 2018-02-07 DIAGNOSIS — Z794 Long term (current) use of insulin: Secondary | ICD-10-CM | POA: Diagnosis not present

## 2018-02-07 DIAGNOSIS — E1151 Type 2 diabetes mellitus with diabetic peripheral angiopathy without gangrene: Secondary | ICD-10-CM | POA: Insufficient documentation

## 2018-02-07 DIAGNOSIS — E1169 Type 2 diabetes mellitus with other specified complication: Secondary | ICD-10-CM | POA: Diagnosis not present

## 2018-02-07 DIAGNOSIS — Z992 Dependence on renal dialysis: Secondary | ICD-10-CM | POA: Insufficient documentation

## 2018-02-07 DIAGNOSIS — M86671 Other chronic osteomyelitis, right ankle and foot: Secondary | ICD-10-CM | POA: Diagnosis not present

## 2018-02-07 DIAGNOSIS — L97512 Non-pressure chronic ulcer of other part of right foot with fat layer exposed: Secondary | ICD-10-CM | POA: Insufficient documentation

## 2018-02-07 DIAGNOSIS — I120 Hypertensive chronic kidney disease with stage 5 chronic kidney disease or end stage renal disease: Secondary | ICD-10-CM | POA: Insufficient documentation

## 2018-02-07 DIAGNOSIS — M069 Rheumatoid arthritis, unspecified: Secondary | ICD-10-CM | POA: Insufficient documentation

## 2018-02-07 DIAGNOSIS — N186 End stage renal disease: Secondary | ICD-10-CM | POA: Insufficient documentation

## 2018-02-07 DIAGNOSIS — F17290 Nicotine dependence, other tobacco product, uncomplicated: Secondary | ICD-10-CM | POA: Diagnosis not present

## 2018-02-07 DIAGNOSIS — E1122 Type 2 diabetes mellitus with diabetic chronic kidney disease: Secondary | ICD-10-CM | POA: Insufficient documentation

## 2018-02-07 DIAGNOSIS — F419 Anxiety disorder, unspecified: Secondary | ICD-10-CM | POA: Diagnosis not present

## 2018-02-07 DIAGNOSIS — E11621 Type 2 diabetes mellitus with foot ulcer: Secondary | ICD-10-CM | POA: Diagnosis not present

## 2018-02-08 DIAGNOSIS — N186 End stage renal disease: Secondary | ICD-10-CM | POA: Diagnosis not present

## 2018-02-08 DIAGNOSIS — D631 Anemia in chronic kidney disease: Secondary | ICD-10-CM | POA: Diagnosis not present

## 2018-02-08 DIAGNOSIS — Z992 Dependence on renal dialysis: Secondary | ICD-10-CM | POA: Diagnosis not present

## 2018-02-08 DIAGNOSIS — D509 Iron deficiency anemia, unspecified: Secondary | ICD-10-CM | POA: Diagnosis not present

## 2018-02-08 DIAGNOSIS — N2581 Secondary hyperparathyroidism of renal origin: Secondary | ICD-10-CM | POA: Diagnosis not present

## 2018-02-11 DIAGNOSIS — N2581 Secondary hyperparathyroidism of renal origin: Secondary | ICD-10-CM | POA: Diagnosis not present

## 2018-02-11 DIAGNOSIS — N186 End stage renal disease: Secondary | ICD-10-CM | POA: Diagnosis not present

## 2018-02-11 DIAGNOSIS — D631 Anemia in chronic kidney disease: Secondary | ICD-10-CM | POA: Diagnosis not present

## 2018-02-11 DIAGNOSIS — Z992 Dependence on renal dialysis: Secondary | ICD-10-CM | POA: Diagnosis not present

## 2018-02-11 DIAGNOSIS — D509 Iron deficiency anemia, unspecified: Secondary | ICD-10-CM | POA: Diagnosis not present

## 2018-02-13 DIAGNOSIS — N186 End stage renal disease: Secondary | ICD-10-CM | POA: Diagnosis not present

## 2018-02-13 DIAGNOSIS — Z992 Dependence on renal dialysis: Secondary | ICD-10-CM | POA: Diagnosis not present

## 2018-02-13 DIAGNOSIS — D509 Iron deficiency anemia, unspecified: Secondary | ICD-10-CM | POA: Diagnosis not present

## 2018-02-13 DIAGNOSIS — N2581 Secondary hyperparathyroidism of renal origin: Secondary | ICD-10-CM | POA: Diagnosis not present

## 2018-02-13 DIAGNOSIS — D631 Anemia in chronic kidney disease: Secondary | ICD-10-CM | POA: Diagnosis not present

## 2018-02-14 ENCOUNTER — Encounter: Payer: Medicare Other | Admitting: Nurse Practitioner

## 2018-02-14 DIAGNOSIS — E1122 Type 2 diabetes mellitus with diabetic chronic kidney disease: Secondary | ICD-10-CM | POA: Diagnosis not present

## 2018-02-14 DIAGNOSIS — E1169 Type 2 diabetes mellitus with other specified complication: Secondary | ICD-10-CM | POA: Diagnosis not present

## 2018-02-14 DIAGNOSIS — M86671 Other chronic osteomyelitis, right ankle and foot: Secondary | ICD-10-CM | POA: Diagnosis not present

## 2018-02-14 DIAGNOSIS — E1151 Type 2 diabetes mellitus with diabetic peripheral angiopathy without gangrene: Secondary | ICD-10-CM | POA: Diagnosis not present

## 2018-02-14 DIAGNOSIS — E11621 Type 2 diabetes mellitus with foot ulcer: Secondary | ICD-10-CM | POA: Diagnosis not present

## 2018-02-14 DIAGNOSIS — L97512 Non-pressure chronic ulcer of other part of right foot with fat layer exposed: Secondary | ICD-10-CM | POA: Diagnosis not present

## 2018-02-15 DIAGNOSIS — D509 Iron deficiency anemia, unspecified: Secondary | ICD-10-CM | POA: Diagnosis not present

## 2018-02-15 DIAGNOSIS — N2581 Secondary hyperparathyroidism of renal origin: Secondary | ICD-10-CM | POA: Diagnosis not present

## 2018-02-15 DIAGNOSIS — D631 Anemia in chronic kidney disease: Secondary | ICD-10-CM | POA: Diagnosis not present

## 2018-02-15 DIAGNOSIS — N186 End stage renal disease: Secondary | ICD-10-CM | POA: Diagnosis not present

## 2018-02-15 DIAGNOSIS — Z992 Dependence on renal dialysis: Secondary | ICD-10-CM | POA: Diagnosis not present

## 2018-02-18 DIAGNOSIS — N2581 Secondary hyperparathyroidism of renal origin: Secondary | ICD-10-CM | POA: Diagnosis not present

## 2018-02-18 DIAGNOSIS — D509 Iron deficiency anemia, unspecified: Secondary | ICD-10-CM | POA: Diagnosis not present

## 2018-02-18 DIAGNOSIS — N186 End stage renal disease: Secondary | ICD-10-CM | POA: Diagnosis not present

## 2018-02-18 DIAGNOSIS — Z992 Dependence on renal dialysis: Secondary | ICD-10-CM | POA: Diagnosis not present

## 2018-02-18 DIAGNOSIS — D631 Anemia in chronic kidney disease: Secondary | ICD-10-CM | POA: Diagnosis not present

## 2018-02-20 DIAGNOSIS — D631 Anemia in chronic kidney disease: Secondary | ICD-10-CM | POA: Diagnosis not present

## 2018-02-20 DIAGNOSIS — D509 Iron deficiency anemia, unspecified: Secondary | ICD-10-CM | POA: Diagnosis not present

## 2018-02-20 DIAGNOSIS — N2581 Secondary hyperparathyroidism of renal origin: Secondary | ICD-10-CM | POA: Diagnosis not present

## 2018-02-20 DIAGNOSIS — Z992 Dependence on renal dialysis: Secondary | ICD-10-CM | POA: Diagnosis not present

## 2018-02-20 DIAGNOSIS — N186 End stage renal disease: Secondary | ICD-10-CM | POA: Diagnosis not present

## 2018-02-20 NOTE — Progress Notes (Signed)
DUSTIN, BUMBAUGH ([id number]) Visit Report for 02/07/2018 Arrival Information Details Patient Name: Timothy Gibson, Timothy Gibson Date of Service: 02/07/2018 8:30 AM Medical Record Number: [id number] Patient Account Number: [id number] Date of Birth/Sex: [birth year] (67 y.o. M) Treating RN: Roger Shelter Primary Care Aune Adami: PATIENT, NO Other Clinician: Referring Emori Mumme: Konrad Felix Treating Deva Ron/Extender: Cathie Olden in Treatment: 10 Visit Information History Since Last Visit All ordered tests and consults were completed: No Patient Arrived: Ambulatory Added or deleted any medications: No Arrival Time: 08:24 Any new allergies or adverse reactions: No Accompanied By: caregiver Had a fall or experienced change in No Transfer Assistance: None activities of daily living that may affect Patient Identification Verified: Yes risk of falls: Secondary Verification Process Completed: Yes Signs or symptoms of abuse/neglect since last visito No Patient Requires Transmission-Based No Hospitalized since last visit: No Precautions: Implantable device outside of the clinic excluding No Patient Has Alerts: No cellular tissue based products placed in the center since last visit: Pain Present Now: No Electronic Signature(s) Signed: 02/08/2018 4:54:12 PM By: Roger Shelter Entered By: Roger Shelter on 02/07/2018 08:24:27 Timothy Gibson ([id number]) -------------------------------------------------------------------------------- Encounter Discharge Information Details Patient Name: Timothy Gibson Date of Service: 02/07/2018 8:30 AM Medical Record Number: [id number] Patient Account Number: [id number] Date of Birth/Sex: [birth year] (67 y.o. M) Treating RN: Ahmed Prima Primary Care Talasia Saulter: PATIENT, NO Other Clinician: Referring Davon Folta: Konrad Felix Treating Naji Mehringer/Extender: Cathie Olden in Treatment: 10 Encounter Discharge Information Items Discharge  Condition: Stable Ambulatory Status: Ambulatory Discharge Destination: Home Transportation: Private Auto Accompanied By: caregiver Schedule Follow-up Appointment: Yes Clinical Summary of Care: Electronic Signature(s) Signed: 02/11/2018 11:57:53 AM By: Alric Quan Entered By: Alric Quan on 02/11/2018 11:57:52 Timothy Gibson ([id number]) -------------------------------------------------------------------------------- Lower Extremity Assessment Details Patient Name: Timothy Gibson Date of Service: 02/07/2018 8:30 AM Medical Record Number: [id number] Patient Account Number: [id number] Date of Birth/Sex: Jul 18, 1950 (67 y.o. M) Treating RN: Roger Shelter Primary Care Rinda Rollyson: PATIENT, NO Other Clinician: Referring Timberly Yott: Konrad Felix Treating Sally-Anne Wamble/Extender: Cathie Olden in Treatment: 10 Edema Assessment Assessed: [Left: No] [Right: No] Edema: [Left: N] [Right: o] Vascular Assessment Claudication: Claudication Assessment [Right:None] Pulses: Dorsalis Pedis Palpable: [Right:Yes] Posterior Tibial Extremity colors, hair growth, and conditions: Extremity Color: [Right:Normal] Hair Growth on Extremity: [Right:Yes] Temperature of Extremity: [Right:Warm] Capillary Refill: [Right:< 3 seconds] Toe Nail Assessment Left: Right: Thick: No Discolored: No Deformed: No Improper Length and Hygiene: No Electronic Signature(s) Signed: 02/08/2018 4:54:12 PM By: Roger Shelter Entered By: Roger Shelter on 02/07/2018 08:33:12 Timothy Gibson ([id number]) -------------------------------------------------------------------------------- Multi Wound Chart Details Patient Name: Timothy Gibson Date of Service: 02/07/2018 8:30 AM Medical Record Number: [id number] Patient Account Number: [id number] Date of Birth/Sex: [birth year] (67 y.o. M) Treating RN: Ahmed Prima Primary Care Annisha Baar: PATIENT, NO Other Clinician: Referring Esteven Overfelt: Konrad Felix Treating Elayjah Chaney/Extender: Cathie Olden in Treatment: 10 Vital Signs Height(in): 76 Pulse(bpm): 41 Weight(lbs): 220 Blood Pressure(mmHg): 152/72 Body Mass Index(BMI): 27 Temperature(F): 98.3 Respiratory Rate 18 (breaths/min): Photos: [1:No Photos] [N/A:N/A] Wound Location: [1:Right Toe Great] [N/A:N/A] Wounding Event: [1:Gradually Appeared] [N/A:N/A] Primary Etiology: [1:Diabetic Wound/Ulcer of the Lower Extremity] [N/A:N/A] Comorbid History: [1:Lymphedema, Arrhythmia, Hypertension, Type II Diabetes, End Stage Renal Disease, Rheumatoid Arthritis, Osteoarthritis, Confinement Anxiety] [N/A:N/A] Date Acquired: [1:11/22/2017] [N/A:N/A] Weeks of Treatment: [1:10] [N/A:N/A] Wound Status: [1:Open] [N/A:N/A] Measurements L x W x D [1:0.5x0.7x0.1] [N/A:N/A] (cm) Area (cm) : [1:0.275] [N/A:N/A] Volume (cm) : [1:0.027] [N/A:N/A] % Reduction in Area: [1:88.30%] [N/A:N/A] % Reduction in Volume: [1:88.60%] [N/A:N/A] Classification: [1:Grade 2] [N/A:N/A] Exudate Amount: [  1:Medium] [N/A:N/A] Exudate Type: [1:Serous] [N/A:N/A] Exudate Color: [1:amber] [N/A:N/A] Wound Margin: [1:Well defined, not attached] [N/A:N/A] Granulation Amount: [1:Medium (34-66%)] [N/A:N/A] Granulation Quality: [1:Pink] [N/A:N/A] Necrotic Amount: [1:Medium (34-66%)] [N/A:N/A] Exposed Structures: [1:Fat Layer (Subcutaneous Tissue) Exposed: Yes Fascia: No Tendon: No Muscle: No Joint: No Bone: No] [N/A:N/A] Epithelialization: [1:Small (1-33%)] [N/A:N/A] Debridement: [1:Debridement - Excisional] [N/A:N/A] Pre-procedure 08:39 N/A N/A Verification/Time Out Taken: Pain Control: Lidocaine 4% Topical Solution N/A N/A Tissue Debrided: Subcutaneous, Slough N/A N/A Level: Skin/Subcutaneous Tissue N/A N/A Debridement Area (sq cm): 0.35 N/A N/A Instrument: Curette N/A N/A Bleeding: Minimum N/A N/A Hemostasis Achieved: Pressure N/A N/A Procedural Pain: 0 N/A N/A Post Procedural Pain: 0 N/A  N/A Debridement Treatment Procedure was tolerated well N/A N/A Response: Post Debridement 0.5x0.7x0.2 N/A N/A Measurements L x W x D (cm) Post Debridement Volume: 0.055 N/A N/A (cm) Periwound Skin Texture: Callus: Yes N/A N/A Excoriation: No Induration: No Crepitus: No Rash: No Scarring: No Periwound Skin Moisture: Maceration: No N/A N/A Dry/Scaly: No Periwound Skin Color: Atrophie Blanche: No N/A N/A Cyanosis: No Ecchymosis: No Erythema: No Hemosiderin Staining: No Mottled: No Pallor: No Rubor: No Temperature: No Abnormality N/A N/A Tenderness on Palpation: No N/A N/A Wound Preparation: Ulcer Cleansing: N/A N/A Rinsed/Irrigated with Saline Topical Anesthetic Applied: Other: lidocaine 4% Procedures Performed: Debridement N/A N/A Treatment Notes Electronic Signature(s) Signed: 02/07/2018 8:44:48 AM By: Lawanda Cousins Entered By: Lawanda Cousins on 02/07/2018 08:44:48 Timothy Gibson, Timothy Gibson ([id number]) -------------------------------------------------------------------------------- [place] Details Patient Name: Timothy Gibson Date of Service: 02/07/2018 8:30 AM Medical Record Number: [id number] Patient Account Number: [id number] Date of Birth/Sex: [birth year] (67 y.o. M) Treating RN: Ahmed Prima Primary Care Kamarian Sahakian: PATIENT, NO Other Clinician: Referring Dorin Stooksbury: Konrad Felix Treating Getsemani Lindon/Extender: Cathie Olden in Treatment: 10 Active Inactive ` Abuse / Safety / Falls / Self Care Management Nursing Diagnoses: Potential for falls Goals: Patient will remain injury free related to falls Date Initiated: 11/27/2017 Target Resolution Date: 12/14/2017 Goal Status: Active Interventions: Assess fall risk on admission and as needed Notes: ` Nutrition Nursing Diagnoses: Impaired glucose control: actual or potential Goals: Patient/caregiver verbalizes understanding of need to maintain therapeutic glucose control per primary care  physician Date Initiated: 11/27/2017 Target Resolution Date: 12/28/2017 Goal Status: Active Interventions: Provide education on elevated blood sugars and impact on wound healing Notes: ` Orientation to the Wound Care Program Nursing Diagnoses: Knowledge deficit related to the wound healing center program Goals: Patient/caregiver will verbalize understanding of the [facility] Date Initiated: 11/27/2017 Target Resolution Date: 12/28/2017 Goal Status: Active Interventions: Timothy Gibson, Timothy Gibson ([id number]) Provide education on orientation to the wound center Notes: ` Soft Tissue Infection Nursing Diagnoses: Potential for infection: soft tissue Goals: Patient will remain free of wound infection Date Initiated: 11/27/2017 Target Resolution Date: 12/28/2017 Goal Status: Active Interventions: Assess signs and symptoms of infection every visit Treatment Activities: Systemic antibiotics : 11/27/2017 Notes: ` Wound/Skin Impairment Nursing Diagnoses: Impaired tissue integrity Goals: Ulcer/skin breakdown will have a volume reduction of 80% by week 12 Date Initiated: 11/27/2017 Target Resolution Date: 02/27/2018 Goal Status: Active Interventions: Assess patient/caregiver ability to perform ulcer/skin care regimen upon admission and as needed Treatment Activities: Referred to DME Deshanda Molitor for dressing supplies : 11/27/2017 Skin care regimen initiated : 11/27/2017 Notes: Electronic Signature(s) Signed: 02/11/2018 4:59:33 PM By: Alric Quan Entered By: Alric Quan on 02/07/2018 08:39:09 Timothy Gibson ([id number]) -------------------------------------------------------------------------------- Pain Assessment Details Patient Name: Timothy Gibson Date of Service: 02/07/2018 8:30 AM Medical Record Number: [id number] Patient Account Number: [id number]  Date of Birth/Sex: 1951/01/30 (67 y.o. M) Treating RN: Roger Shelter Primary Care Robt Okuda: PATIENT, NO Other  Clinician: Referring Lavanda Nevels: Konrad Felix Treating Rondo Spittler/Extender: Cathie Olden in Treatment: 10 Active Problems Location of Pain Severity and Description of Pain Patient Has Paino No Site Locations Pain Management and Medication Current Pain Management: Electronic Signature(s) Signed: 02/08/2018 4:54:12 PM By: Roger Shelter Entered By: Roger Shelter on 02/07/2018 08:24:33 Timothy Gibson ([id number]) -------------------------------------------------------------------------------- Patient/Caregiver Education Details Patient Name: Timothy Gibson Date of Service: 02/07/2018 8:30 AM Medical Record Number: [id number] Patient Account Number: [id number] Date of Birth/Gender: [birth year] (67 y.o. M) Treating RN: Ahmed Prima Primary Care Physician: PATIENT, NO Other Clinician: Referring Physician: Konrad Felix Treating Physician/Extender: Cathie Olden in Treatment: 10 Education Assessment Education Provided To: Patient Education Topics Provided Wound/Skin Impairment: Handouts: Caring for Your Ulcer, Skin Care Do's and Dont's, Other: change dressing as ordered Methods: Demonstration, Explain/Verbal Responses: State content correctly Electronic Signature(s) Signed: 02/11/2018 4:59:33 PM By: Alric Quan Entered By: Alric Quan on 02/11/2018 11:58:15 Timothy Gibson ([id number]) -------------------------------------------------------------------------------- Wound Assessment Details Patient Name: Timothy Gibson Date of Service: 02/07/2018 8:30 AM Medical Record Number: [id number] Patient Account Number: [id number] Date of Birth/Sex: October 06, 1950 (67 y.o. M) Treating RN: Roger Shelter Primary Care Basha Krygier: PATIENT, NO Other Clinician: Referring Mikhala Kenan: Konrad Felix Treating Azile Minardi/Extender: Cathie Olden in Treatment: 10 Wound Status Wound Number: 1 Primary Diabetic Wound/Ulcer of the Lower Extremity Etiology: Wound  Location: Right Toe Great Wound Open Wounding Event: Gradually Appeared Status: Date Acquired: 11/22/2017 Comorbid Lymphedema, Arrhythmia, Hypertension, Type II Weeks Of Treatment: 10 History: Diabetes, End Stage Renal Disease, Clustered Wound: No Rheumatoid Arthritis, Osteoarthritis, Confinement Anxiety Wound Measurements Length: (cm) 0.5 Width: (cm) 0.7 Depth: (cm) 0.1 Area: (cm) 0.275 Volume: (cm) 0.027 % Reduction in Area: 88.3% % Reduction in Volume: 88.6% Epithelialization: Small (1-33%) Tunneling: No Undermining: No Wound Description Classification: Grade 2 Wound Margin: Well defined, not attached Exudate Amount: Medium Exudate Type: Serous Exudate Color: amber Foul Odor After Cleansing: No Slough/Fibrino Yes Wound Bed Granulation Amount: Medium (34-66%) Exposed Structure Granulation Quality: Pink Fascia Exposed: No Necrotic Amount: Medium (34-66%) Fat Layer (Subcutaneous Tissue) Exposed: Yes Necrotic Quality: Adherent Slough Tendon Exposed: No Muscle Exposed: No Joint Exposed: No Bone Exposed: No Periwound Skin Texture Texture Color No Abnormalities Noted: No No Abnormalities Noted: No Callus: Yes Atrophie Blanche: No Crepitus: No Cyanosis: No Excoriation: No Ecchymosis: No Induration: No Erythema: No Rash: No Hemosiderin Staining: No Scarring: No Mottled: No Pallor: No Moisture Rubor: No No Abnormalities Noted: No Dry / Scaly: No Temperature / Pain Timothy Gibson, FRICKE. ([id number]) Maceration: No Temperature: No Abnormality Wound Preparation Ulcer Cleansing: Rinsed/Irrigated with Saline Topical Anesthetic Applied: Other: lidocaine 4%, Electronic Signature(s) Signed: 02/08/2018 4:54:12 PM By: Roger Shelter Entered By: Roger Shelter on 02/07/2018 08:32:51 Timothy Gibson, Timothy Gibson ([id number]) -------------------------------------------------------------------------------- [place] Details Patient Name: Timothy Gibson Date of Service:  02/07/2018 8:30 AM Medical Record Number: [id number] Patient Account Number: [id number] Date of Birth/Sex: 1951/03/15 (67 y.o. M) Treating RN: Roger Shelter Primary Care Nahzir Pohle: PATIENT, NO Other Clinician: Referring Camrin Gearheart: Konrad Felix Treating Sachiko Methot/Extender: Cathie Olden in Treatment: 10 Vital Signs Time Taken: 08:24 Temperature (F): 98.3 Height (in): 76 Pulse (bpm): 83 Weight (lbs): 220 Respiratory Rate (breaths/min): 18 Body Mass Index (BMI): 26.8 Blood Pressure (mmHg): 152/72 Reference Range: 80 - 120 mg / dl Electronic Signature(s) Signed: 02/08/2018 4:54:12 PM By: Roger Shelter Entered By: Roger Shelter on 02/07/2018 29:47:65

## 2018-02-20 NOTE — Progress Notes (Signed)
Timothy, Gibson ([id number]) Visit Report for 02/07/2018 Chief Complaint Document Details Patient Name: Timothy Gibson, Timothy Gibson Date of Service: 02/07/2018 8:30 AM Medical Record Number: [id number] Patient Account Number: [id number] Date of Birth/Sex: 03/05/1951 (67 y.o. M) Treating RN: Ahmed Prima Primary Care Provider: PATIENT, NO Other Clinician: Referring Provider: Konrad Felix Treating Provider/Extender: Cathie Olden in Treatment: 10 Information Obtained from: Patient Chief Complaint Right Great Toe Electronic Signature(s) Signed: 02/07/2018 8:45:05 AM By: Lawanda Cousins Entered By: Lawanda Cousins on 02/07/2018 08:45:03 Timothy Gibson ([id number]) -------------------------------------------------------------------------------- Debridement Details Patient Name: Timothy Gibson Date of Service: 02/07/2018 8:30 AM Medical Record Number: [id number] Patient Account Number: [id number] Date of Birth/Sex: 08-29-1950 (67 y.o. M) Treating RN: Ahmed Prima Primary Care Provider: PATIENT, NO Other Clinician: Referring Provider: Konrad Felix Treating Provider/Extender: Cathie Olden in Treatment: 10 Debridement Performed for Wound #1 Right Toe Great Assessment: Performed By: Physician Lawanda Cousins, NP Debridement Type: Debridement Severity of Tissue Pre Fat layer exposed Debridement: Pre-procedure Verification/Time Yes - 08:39 Out Taken: Start Time: 08:39 Pain Control: Lidocaine 4% Topical Solution Total Area Debrided (L x W): 0.5 (cm) x 0.7 (cm) = 0.35 (cm) Tissue and other material Viable, Non-Viable, Slough, Subcutaneous, Fibrin/Exudate, Slough debrided: Level: Skin/Subcutaneous Tissue Debridement Description: Excisional Instrument: Curette Bleeding: Minimum Hemostasis Achieved: Pressure End Time: 08:41 Procedural Pain: 0 Post Procedural Pain: 0 Response to Treatment: Procedure was tolerated well Level of Consciousness: Awake and Alert Post  Debridement Measurements of Total Wound Length: (cm) 0.5 Width: (cm) 0.7 Depth: (cm) 0.2 Volume: (cm) 0.055 Character of Wound/Ulcer Post Debridement: Requires Further Debridement Severity of Tissue Post Debridement: Fat layer exposed Post Procedure Diagnosis Same as Pre-procedure Electronic Signature(s) Signed: 02/07/2018 9:54:05 PM By: Lawanda Cousins Signed: 02/11/2018 4:59:33 PM By: Alric Quan Entered By: Alric Quan on 02/07/2018 08:41:30 Timothy Gibson ([id number]) -------------------------------------------------------------------------------- HPI Details Patient Name: Timothy Gibson Date of Service: 02/07/2018 8:30 AM Medical Record Number: [id number] Patient Account Number: [id number] Date of Birth/Sex: [birth year] (67 y.o. M) Treating RN: Ahmed Prima Primary Care Provider: PATIENT, NO Other Clinician: Referring Provider: Konrad Felix Treating Provider/Extender: Cathie Olden in Treatment: 10 History of Present Illness HPI Description: 11/27/17 on evaluation today patient presents for initial evaluation concerning an issue that he has been having with his right great toe which began last Thursday 11/22/17. He has a history of diabetes mellitus type to which he has had for greater than 30 years currently he is on insulin. Subsequently he also has a history of hypertension. On physical exam inspection is also appears he may have some peripheral vascular disease with his ABI's being noncompressible bilaterally. He is on dialysis as well and this is on Monday, Wednesday, and Friday. Patient was hospitalized back in January/February 2019 although this was more for stomach/back pain though they never told me exactly what was going on. This is according to the patient. Subsequently the ulcer which is between the third and fourth toe when space of the right foot as well is on the plantar aspect of the fourth toe is due to him having cleaned his toes this morning  and he states that his finger which is large split the toe tissue in between causing the wounds that we currently see. With that being said he states this has happened before I explained I would definitely recommend that he not do this any longer. He can use a small soft washcloth to clean between the toes without causing trauma. Subsequently the right great toe hatchery does show  evidence of necrotic tissue present on the surface of the wound specifically there is callous and dead skin surrounding which is trapping fluid causing problems as far as the wound is concerned. The surface of the wound does show slough although due to his vascular flow I'm not going to sharply debride this today I think I will selectively debride away the necrotic skin as well is callous from around the surrounding so this will not continue to be a moisture issue. Nonetheless the patient has no pain he does have diabetic neuropathy. 12/06/17-He is here in follow-up evaluation for a right great toe ulcer. He is voicing no complaints or concerns. He continues to infrequently/socially smokes cigars with no desire to quit. He has been compliant with offloading, using open toed surgical shoe; has been compliant using Santyl daily. He continues on clindamycin although takes it inconsistently secondary to indigestion. The plain film x-ray performed on 5/23 impression: Soft tissue swelling of the left first toe and is noted with probable irregular lucency involving distal tuft of first distal phalanx concerning for osteomyelitis. MRI may be performed for further evaluation; MRI ordered. The vascular evaluation performed on 5/24 field non-compressible ABIs bilaterally and reduced TBI bilaterally suggesting significant tibial disease. He will be referred to vascular medicine for further evaluation. 12/13/17-He is here in follow-up evaluation for right great toe ulcer. He has appointment next Thursday for the MRI and  vascular evaluation. Wound culture obtained last week grew abundant Klebsiella oxytoca (multidrug sensitivity), and abundant enteric coccus faecalis (sensitive to ampicillin). Ampicillin and Cipro were called in, along with a probiotic. In light of his appointments next Thursday he will follow-up in 2 weeks, we will continue with Santyl 12/27/17-He is here for evaluation for a right great toe ulcer. 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 He did have evaluation by vascular medicine, Dr. Ronalee Belts, on 6/13, at that appointment it was decided for him to undergo angiography with possible intervention but he refused and is still considering. If he chooses to have it done he will contact the office. He has not heard back from either ID offices that he was referred to two weeks ago: [place] and [place]. there is improvement in both appearance and measurement to the ulcer. We will extend antibiotic therapy (amoxicillin 500 every 12, Cipro 500 daily) for additional 2 weeks pending infectious disease consult, he will continue with Santyl daily. He was reminded to continue with probiotic therapy while on oral antibiotics; he denies any GI disturbance. 01/03/18-He is here in follow-up evaluation for right great toe ulcer. He is decided to not undergo any vascular intervention at this time. He was established with [facility] infectious disease (Dr. Linus Salmons) last week initiated on vancomycin and cefazolin with dialysis treatment for 6 weeks. We will continue with current treatment plan with Santyl and offloading and he will follow- up in 2 weeks 01/17/18-He is seen in follow-up evaluation for right great toe ulcer. There is improvement in appearance with less nonviable tissue present. We will continue with same treatment plan he will follow-up next week. He is tolerating IV antibiotics without any complications 3/79/02-IO is seen in  follow up evaluation for right great toe ulcer. There continues to be improvement. He is tolerating IV antibiotics with hemodialysis, completion date of 7/31. We will transition to collagen and and follow-up next week 02/07/18-Eosinophilic for her right great toe ulcer. There is red granulation tissue throughout the wound, improved  in appearance. He completed antibiotics yesterday. He saw podiatry last Thursday for nail trimming, at that appointment he ESIQUIO, BOESEN. ([id number]) periwound callus was trimmed and a culture was obtained; culture negative. He does not have a follow up appointment with infectious disease. We will submit for grafix. Electronic Signature(s) Signed: 02/07/2018 8:48:35 AM By: Lawanda Cousins Entered By: Lawanda Cousins on 02/07/2018 08:48:34 Timothy Gibson ([id number]) -------------------------------------------------------------------------------- Physician Orders Details Patient Name: Timothy Gibson Date of Service: 02/07/2018 8:30 AM Medical Record Number: [id number] Patient Account Number: [id number] Date of Birth/Sex: 12/12/1950 (67 y.o. M) Treating RN: Ahmed Prima Primary Care Provider: PATIENT, NO Other Clinician: Referring Provider: Konrad Felix Treating Provider/Extender: Cathie Olden in Treatment: 10 Verbal / Phone Orders: Yes Clinician: Carolyne Fiscal, Debi Read Back and Verified: Yes Diagnosis Coding Wound Cleansing Wound #1 Right Toe Great o Clean wound with Normal Saline. o Cleanse wound with mild soap and water Anesthetic (add to Medication List) Wound #1 Right Toe Great o Topical Lidocaine 4% cream applied to wound bed prior to debridement (In Clinic Only). Primary Wound Dressing Wound #1 Right Toe Great o Silver Collagen - lightly moisten with saline Secondary Dressing Wound #1 Right Toe Great o Gauze and Kerlix/Conform Dressing Change Frequency Wound #1 Right Toe Great o Change dressing every day. Follow-up  Appointments Wound #1 Right Toe Great o Return Appointment in 1 week. Edema Control Wound #1 Right Toe Great o Elevate legs to the level of the heart and pump ankles as often as possible Off-Loading Wound #1 Right Toe Great o Open toe surgical shoe to: Additional Orders / Instructions Wound #1 Right Toe Great o Increase protein intake. Patient Medications Allergies: no known allergies BROOX, LONIGRO ([id number]) Notifications Medication Indication Start End lidocaine DOSE 1 - topical 4 % cream - 1 cream topical Electronic Signature(s) Signed: 02/07/2018 9:54:05 PM By: Lawanda Cousins Signed: 02/11/2018 4:59:33 PM By: Alric Quan Entered By: Alric Quan on 02/07/2018 08:42:19 JAHSIR, RAMA ([id number]) -------------------------------------------------------------------------------- Prescription 02/07/2018 Patient Name: Timothy Gibson Provider: Lawanda Cousins NP Date of Birth: 10-21-1950 NPI#: [id number] Sex: M DEA#: [id number] Phone #: [phone] License #: Patient Address: [facility] [place], [place] [address] [address] [place]  [address] [phone] Allergies no known allergies Medication Medication: Route: Strength: Form: lidocaine topical 4% cream Class: TOPICAL LOCAL ANESTHETICS Dose: Frequency / Time: Indication: 1 1 cream topical Number of Refills: Number of Units: 0 Generic Substitution: Start Date: End Date: Administered at Substitution Permitted Facility: Yes Time Administered: Time Discontinued: Note to Pharmacy: Signature(s): Date(s): Electronic Signature(s) Signed: 02/07/2018 9:54:05 PM By: Lawanda Cousins Signed: 02/11/2018 4:59:33 PM By: Alric Quan Entered By: Alric Quan on 02/07/2018 08:42:20 CRUZITO, STANDRE ([id number], SCHELLENBERG  ([id number]) --------------------------------------------------------------------------------  Problem List Details Patient Name: Timothy Gibson Date of Service: 02/07/2018 8:30 AM Medical Record Number: [id number] Patient Account Number: [id number] Date of Birth/Sex: [birth year] (67 y.o. 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/27/2017 No Yes layer exposed E11.621 Type 2 diabetes mellitus with foot ulcer 11/27/2017 No Yes I70.235 Atherosclerosis of native arteries of right leg with ulceration of 12/06/2017 No Yes other part of foot I10 Essential (primary) hypertension 11/27/2017 No Yes E44.1 Mild  protein-calorie malnutrition 11/27/2017 No Yes M86.671 Other chronic osteomyelitis, right ankle and foot 12/27/2017 No Yes Inactive Problems Resolved Problems Electronic Signature(s) Signed: 02/07/2018 8:44:41 AM By: Lawanda Cousins Entered By: Lawanda Cousins on 02/07/2018 08:44:40 Timothy Gibson ([id number]) -------------------------------------------------------------------------------- Progress Note Details Patient Name: Timothy Gibson Date of Service: 02/07/2018 8:30 AM Medical Record Number: [id number] Patient Account Number: [id number] Date of Birth/Sex: Dec 07, 1950 (67 y.o. M) Treating RN: Ahmed Prima Primary Care Provider: PATIENT, NO Other Clinician: Referring Provider: Konrad Felix Treating Provider/Extender: Cathie Olden in Treatment: 10 Subjective Chief Complaint Information obtained from Patient Right Great Toe History of Present Illness (HPI) 11/27/17 on evaluation today patient presents for initial evaluation concerning an issue that he has been having with  his right great toe which began last Thursday 11/22/17. He has a history of diabetes mellitus type to which he has had for greater than 30 years currently he is on insulin. Subsequently he also has a history of hypertension. On physical exam inspection is also appears he may have some peripheral vascular disease with his ABI's being noncompressible bilaterally. He is on dialysis as well and this is on Monday, Wednesday, and Friday. Patient was hospitalized back in January/February 2019 although this was more for stomach/back pain though they never told me exactly what was going on. This is according to the patient. Subsequently the ulcer which is between the third and fourth toe when space of the right foot as well is on the plantar aspect of the fourth toe is due to him having cleaned his toes this morning and he states that his finger which is large split the toe tissue in between causing the wounds that we currently see. With that being said he states this has happened before I explained I would definitely recommend that he not do this any longer. He can use a small soft washcloth to clean between the toes without causing trauma. Subsequently the right great toe hatchery does show evidence of necrotic tissue present on the surface of the wound specifically there is callous and dead skin surrounding which is trapping fluid causing problems as far as the wound is concerned. The surface of the wound does show slough although due to his vascular flow I'm not going to sharply debride this today I think I will selectively debride away the necrotic skin as well is callous from around the surrounding so this will not continue to be a moisture issue. Nonetheless the patient has no pain he does have diabetic neuropathy. 12/06/17-He is here in follow-up evaluation for a right great toe ulcer. He is voicing no complaints or concerns. He continues to infrequently/socially smokes cigars with no desire to quit.  He has been compliant with offloading, using open toed surgical shoe; has been compliant using Santyl daily. He continues on clindamycin although takes it inconsistently secondary to indigestion. The plain film x-ray performed on 5/23 impression: Soft tissue swelling of the left first toe and is noted with probable irregular lucency involving distal tuft of first distal phalanx concerning for osteomyelitis. MRI may be performed for further evaluation; MRI ordered. The vascular evaluation performed on 5/24 field non-compressible ABIs bilaterally and reduced TBI bilaterally suggesting significant tibial disease. He will be referred to vascular medicine for further evaluation. 12/13/17-He is here in follow-up evaluation for right great toe ulcer. He has appointment next Thursday for the MRI and vascular evaluation. Wound culture obtained last week grew abundant Klebsiella oxytoca (multidrug sensitivity), and abundant enteric coccus faecalis (sensitive to ampicillin).  Ampicillin and Cipro were called in, along with a probiotic. In light of his appointments next Thursday he will follow-up in 2 weeks, we will continue with Santyl 12/27/17-He is here for evaluation for a right great toe ulcer. 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 He did have evaluation by vascular medicine, Dr. Ronalee Belts, on 6/13, at that appointment it was decided for him to undergo angiography with possible intervention but he refused and is still considering. If he chooses to have it done he will contact the office. He has not heard back from either ID offices that he was referred to two weeks ago: [place] and [place]. there is improvement in both appearance and measurement to the ulcer. We will extend antibiotic therapy (amoxicillin 500 every 12, Cipro 500 daily) for additional 2 weeks pending infectious disease consult, he will continue with Santyl daily.  He was reminded to continue with probiotic therapy while on oral antibiotics; he denies any GI disturbance. 01/03/18-He is here in follow-up evaluation for right great toe ulcer. He is decided to not undergo any vascular intervention at this time. He was established with [facility] infectious disease (Dr. Linus Salmons) last week initiated on vancomycin and cefazolin with dialysis treatment for 6 weeks. We will continue with current treatment plan with Santyl and offloading and he will follow- up in 2 weeks 01/17/18-He is seen in follow-up evaluation for right great toe ulcer. There is improvement in appearance with less nonviable tissue present. We will continue with same treatment plan he will follow-up next week. He is tolerating IV antibiotics without ZEYAD, DELAGUILA. ([id number]) any complications 4/74/25-ZD is seen in follow up evaluation for right great toe ulcer. There continues to be improvement. He is tolerating IV antibiotics with hemodialysis, completion date of 7/31. We will transition to collagen and and follow-up next week 02/07/18-Eosinophilic for her right great toe ulcer. There is red granulation tissue throughout the wound, improved in appearance. He completed antibiotics yesterday. He saw podiatry last Thursday for nail trimming, at that appointment he periwound callus was trimmed and a culture was obtained; culture negative. He does not have a follow up appointment with infectious disease. We will submit for grafix. Patient History Information obtained from Patient. Family History Cancer - Mother,Father, Diabetes - Mother, Heart Disease - Mother, Hypertension - Mother,Father, Stroke - Father, Thyroid Problems - Mother, No family history of Kidney Disease, Lung Disease, Seizures, Tuberculosis. Social History Former smoker - cigar about 4 to 5 days, Marital Status - Divorced, Alcohol Use - Never, Drug Use - No History, Caffeine Use - Daily. Medical History Hospitalization/Surgery  History - 08/11/2017, [facility], stomach and back pain. Review of Systems (ROS) Constitutional Symptoms (General Health) Denies complaints or symptoms of Fatigue, Fever, Chills. Respiratory Denies complaints or symptoms of Chronic or frequent coughs, Shortness of Breath. Cardiovascular Denies complaints or symptoms of Chest pain, LE edema. Gastrointestinal Denies complaints or symptoms of Frequent diarrhea, Nausea, Vomiting. Objective Constitutional Vitals Time Taken: 8:24 AM, Height: 76 in, Weight: 220 lbs, BMI: 26.8, Temperature: 98.3 F, Pulse: 83 bpm, Respiratory Rate: 18 breaths/min, Blood Pressure: 152/72 mmHg. Integumentary (Hair, Skin) Wound #1 status is Open. Original cause of wound was Gradually Appeared. The wound is located on the Right Toe Great. The wound measures 0.5cm length x 0.7cm width x 0.1cm depth; 0.275cm^2 area and 0.027cm^3 volume. There is Fat Layer (Subcutaneous Tissue) Exposed exposed. There is no tunneling or undermining noted. There is a  medium amount of serous drainage noted. The wound margin is well defined and not attached to the wound base. There is medium (34-66%) pink granulation within the wound bed. There is a medium (34-66%) amount of necrotic tissue within the wound bed including Adherent Slough. The periwound skin appearance exhibited: Callus. The periwound skin appearance did not exhibit: Crepitus, Excoriation, Induration, Rash, Scarring, Dry/Scaly, Maceration, Atrophie Blanche, Cyanosis, Ecchymosis, Hemosiderin CLOIS, TREANOR. ([id number]) Staining, Mottled, Pallor, Rubor, Erythema. Periwound temperature was noted as No Abnormality. 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 There was a Excisional Skin/Subcutaneous Tissue Debridement with a total area of 0.35 sq cm performed by Lawanda Cousins, NP. With the following instrument(s): Curette to remove Viable and Non-Viable tissue/material. Material removed includes Subcutaneous Tissue, Slough, and Fibrin/Exudate after achieving pain control using Lidocaine 4% Topical Solution. No specimens were taken. A time out was conducted at 08:39, prior to the start of the procedure. A Minimum amount of bleeding was controlled with Pressure. The procedure was tolerated well with a pain level of 0 throughout and a pain level of 0 following the procedure. Patient s Level of Consciousness post procedure was recorded as Awake and Alert. Post Debridement Measurements: 0.5cm length x 0.7cm width x 0.2cm depth; 0.055cm^3 volume. Character of Wound/Ulcer Post Debridement requires further debridement. Severity of Tissue Post Debridement is: Fat layer exposed. Post procedure Diagnosis Wound #1: Same as Pre-Procedure Plan Wound Cleansing: Wound #1 Right Toe Great: Clean wound with Normal Saline. Cleanse wound with mild soap and water Anesthetic (add to Medication List): Wound #1 Right Toe Great: Topical Lidocaine 4% cream applied to wound bed prior to debridement (In Clinic Only). Primary Wound Dressing: Wound #1 Right Toe Great: Silver Collagen - lightly moisten with saline Secondary Dressing: Wound #1 Right Toe Great: Gauze and Kerlix/Conform TOBBY, FAWCETT ([id number]) Dressing Change Frequency: Wound #1 Right Toe Great: Change dressing every day. Follow-up Appointments: Wound #1 Right Toe Great: Return Appointment in 1 week. Edema Control: Wound #1 Right Toe Great: Elevate legs to the level  of the heart and pump ankles as often as possible Off-Loading: Wound #1 Right Toe Great: Open toe surgical shoe to: Additional Orders / Instructions: Wound #1 Right Toe Great: Increase protein intake. The following medication(s) was prescribed: lidocaine topical 4 % cream 1 1 cream topical was prescribed at facility Electronic Signature(s) Signed: 02/07/2018 8:53:59 AM By: Lawanda Cousins Entered By: Lawanda Cousins on 02/07/2018 08:53:59 ABDI, HUSAK ([id number]) -------------------------------------------------------------------------------- ROS/PFSH Details Patient Name: Timothy Gibson Date of Service: 02/07/2018 8:30 AM Medical Record Number: [id number] Patient Account Number: [id number] Date of Birth/Sex: 1950-10-16 (67 y.o. M) Treating RN: Ahmed Prima Primary Care Provider: PATIENT, NO Other Clinician: Referring Provider: Konrad Felix Treating Provider/Extender: Cathie Olden in Treatment: 10 Information Obtained From Patient Wound History Constitutional Symptoms (General Health) Complaints and Symptoms: Negative for: Fatigue; Fever; Chills Respiratory Complaints and Symptoms: Negative for: Chronic or frequent coughs; Shortness of Breath Medical History: Negative for: Aspiration; Asthma; Chronic Obstructive Pulmonary Disease (COPD); Pneumothorax; Sleep Apnea; Tuberculosis Cardiovascular Complaints and Symptoms: Negative  for: Chest pain; LE edema Medical History: Positive for: Arrhythmia; Hypertension Negative for: Congestive Heart Failure; Coronary Artery Disease; Deep Vein Thrombosis; Hypotension; Myocardial Infarction; Peripheral Arterial Disease; Peripheral Venous Disease; Phlebitis; Vasculitis Gastrointestinal Complaints and Symptoms: Negative for: Frequent diarrhea; Nausea; Vomiting Medical History: Negative for: Cirrhosis ; Colitis; Crohnos; Hepatitis A; Hepatitis B; Hepatitis C Eyes Medical History: Negative for: Cataracts; Glaucoma; Optic  Neuritis Ear/Nose/Mouth/Throat Medical History: Negative for: Chronic sinus problems/congestion; Middle ear problems Hematologic/Lymphatic Medical History: Positive for: Lymphedema SPERO, GUNNELS ([id number]) Negative for: Anemia; Hemophilia; Human Immunodeficiency Virus; Sickle Cell Disease Endocrine Medical History: Positive for: Type II Diabetes Time with diabetes: 30 years Treated with: Diet Blood sugar tested every day: No Genitourinary Medical History: Positive for: End Stage Renal Disease Immunological Medical History: Negative for: Lupus Erythematosus; Raynaudos Musculoskeletal Medical History: Positive for: Rheumatoid Arthritis; Osteoarthritis Negative for: Gout; Osteomyelitis Neurologic Medical History: Negative for: Dementia; Neuropathy; Quadriplegia; Paraplegia; Seizure Disorder Oncologic Medical History: Negative for: Received Chemotherapy; Received Radiation Psychiatric Medical History: Positive for: Confinement Anxiety Negative for: Anorexia/bulimia Immunizations Pneumococcal Vaccine: Received Pneumococcal Vaccination: Yes Implantable Devices Hospitalization / Surgery History Name of Hospital Purpose of Hospitalization/Surgery Date [facility] stomach and back pain 08/11/2017 Family and Social History Cancer: Yes - Mother,Father; Diabetes: Yes - Mother; Heart Disease: Yes - Mother; Hypertension: Yes - Mother,Father; Kidney Disease: No; Lung Disease: No; Seizures: No; Stroke: Yes - Father; Thyroid Problems: Yes - Mother; Tuberculosis: No; Former smoker - cigar about 4 to 5 days; Marital Status - Divorced; Alcohol Use: Never; Drug Use: No History; Caffeine Use: Daily; Financial Concerns: No; Food, Games developer or Shelter Needs: No; Support System Lacking: No; Transportation MORIS, [place]ORD ([id number]) Concerns: No; Advanced Directives: Yes (Not Provided); Patient does not want information on Advanced Directives; Do not resuscitate: No; Living Will: Yes (Not  Provided); Medical Power of Attorney: Yes (Not Provided) Physician Affirmation I have reviewed and agree with the above information. Electronic Signature(s) Signed: 02/07/2018 9:54:05 PM By: Lawanda Cousins Signed: 02/11/2018 4:59:33 PM By: Alric Quan Entered By: Lawanda Cousins on 02/07/2018 08:49:19 DAHMIR, EPPERLY ([id number]) -------------------------------------------------------------------------------- [place] Details Patient Name: Timothy Gibson Date of Service: 02/07/2018 Medical Record Number: [id number] Patient Account Number: [id number] Date of Birth/Sex: 07-30-1950 (67 y.o. M) Treating RN: Ahmed Prima Primary Care Provider: PATIENT, NO Other Clinician: Referring Provider: Konrad Felix Treating Provider/Extender: Cathie Olden in Treatment: 10 Diagnosis Coding ICD-10 Codes Code Description [phone] Non-pressure chronic ulcer of other part of right foot with fat layer exposed E11.621 Type 2 diabetes mellitus with foot ulcer I70.235 Atherosclerosis of native arteries of right leg with ulceration of other part of foot I10 Essential (primary) hypertension E44.1 Mild protein-calorie malnutrition M86.671 Other chronic osteomyelitis, right ankle and foot Facility Procedures CPT4 Code: [id number] Description: 03500 - DEB SUBQ TISSUE 20 SQ CM/< ICD-10 Diagnosis Description L97.512 Non-pressure chronic ulcer of other part of right foot with fat l Modifier: ayer exposed Quantity: 1 Physician Procedures CPT4 Code: 9381829 Description: 11042 - WC PHYS SUBQ TISS 20 SQ CM ICD-10 Diagnosis Description L97.512 Non-pressure chronic ulcer of other part of right foot with fat l Modifier: ayer exposed Quantity: 1 Electronic Signature(s) Signed: 02/07/2018 8:54:24 AM By: Lawanda Cousins Entered By: Lawanda Cousins on 02/07/2018 08:54:23

## 2018-02-21 ENCOUNTER — Encounter: Payer: Medicare Other | Admitting: Nurse Practitioner

## 2018-02-21 DIAGNOSIS — E11621 Type 2 diabetes mellitus with foot ulcer: Secondary | ICD-10-CM | POA: Diagnosis not present

## 2018-02-21 DIAGNOSIS — L97512 Non-pressure chronic ulcer of other part of right foot with fat layer exposed: Secondary | ICD-10-CM | POA: Diagnosis not present

## 2018-02-21 DIAGNOSIS — E1122 Type 2 diabetes mellitus with diabetic chronic kidney disease: Secondary | ICD-10-CM | POA: Diagnosis not present

## 2018-02-21 DIAGNOSIS — E1169 Type 2 diabetes mellitus with other specified complication: Secondary | ICD-10-CM | POA: Diagnosis not present

## 2018-02-21 DIAGNOSIS — M86671 Other chronic osteomyelitis, right ankle and foot: Secondary | ICD-10-CM | POA: Diagnosis not present

## 2018-02-21 DIAGNOSIS — E1151 Type 2 diabetes mellitus with diabetic peripheral angiopathy without gangrene: Secondary | ICD-10-CM | POA: Diagnosis not present

## 2018-02-22 DIAGNOSIS — Z992 Dependence on renal dialysis: Secondary | ICD-10-CM | POA: Diagnosis not present

## 2018-02-22 DIAGNOSIS — D509 Iron deficiency anemia, unspecified: Secondary | ICD-10-CM | POA: Diagnosis not present

## 2018-02-22 DIAGNOSIS — N2581 Secondary hyperparathyroidism of renal origin: Secondary | ICD-10-CM | POA: Diagnosis not present

## 2018-02-22 DIAGNOSIS — N186 End stage renal disease: Secondary | ICD-10-CM | POA: Diagnosis not present

## 2018-02-22 DIAGNOSIS — D631 Anemia in chronic kidney disease: Secondary | ICD-10-CM | POA: Diagnosis not present

## 2018-02-25 DIAGNOSIS — N2581 Secondary hyperparathyroidism of renal origin: Secondary | ICD-10-CM | POA: Diagnosis not present

## 2018-02-25 DIAGNOSIS — Z992 Dependence on renal dialysis: Secondary | ICD-10-CM | POA: Diagnosis not present

## 2018-02-25 DIAGNOSIS — N186 End stage renal disease: Secondary | ICD-10-CM | POA: Diagnosis not present

## 2018-02-25 DIAGNOSIS — D509 Iron deficiency anemia, unspecified: Secondary | ICD-10-CM | POA: Diagnosis not present

## 2018-02-25 DIAGNOSIS — D631 Anemia in chronic kidney disease: Secondary | ICD-10-CM | POA: Diagnosis not present

## 2018-02-25 NOTE — Progress Notes (Signed)
Timothy Gibson, Timothy Gibson ([id number]) Visit Report for 02/14/2018 Arrival Information Details Patient Name: Timothy Gibson, Timothy Gibson Date of Service: 02/14/2018 9:00 AM Medical Record Number: [id number] Patient Account Number: [id number] Date of Birth/Sex: 1951-02-13 (67 y.o. M) Treating RN: Montey Hora Primary Care Katria Botts: PATIENT, NO Other Clinician: Referring Chantavia Bazzle: Konrad Felix Treating Shaylie Eklund/Extender: Cathie Olden in Treatment: 11 Visit Information History Since Last Visit Added or deleted any medications: No Patient Arrived: Ambulatory Any new allergies or adverse reactions: No Arrival Time: 08:55 Had a fall or experienced change in No Accompanied By: caregiver activities of daily living that may affect Transfer Assistance: None risk of falls: Patient Identification Verified: Yes Signs or symptoms of abuse/neglect since last visito No Secondary Verification Process Completed: Yes Hospitalized since last visit: No Patient Requires Transmission-Based No Implantable device outside of the clinic excluding No Precautions: cellular tissue based products placed in the center Patient Has Alerts: No since last visit: Has Dressing in Place as Prescribed: Yes Pain Present Now: No Electronic Signature(s) Signed: 02/14/2018 4:27:39 PM By: Montey Hora Entered By: Montey Hora on 02/14/2018 08:55:55 Timothy Gibson ([id number]) -------------------------------------------------------------------------------- Encounter Discharge Information Details Patient Name: Timothy Gibson Date of Service: 02/14/2018 9:00 AM Medical Record Number: [id number] Patient Account Number: [id number] Date of Birth/Sex: 09/18/1950 (67 y.o. M) Treating RN: Ahmed Prima Primary Care Anysa Tacey: PATIENT, NO Other Clinician: Referring Niambi Smoak: Konrad Felix Treating Chimere Klingensmith/Extender: Cathie Olden in Treatment: 11 Encounter Discharge Information Items Discharge Condition:  Stable Ambulatory Status: Ambulatory Discharge Destination: Home Transportation: Private Auto Schedule Follow-up Appointment: Yes Clinical Summary of Care: Electronic Signature(s) Signed: 02/14/2018 4:46:33 PM By: Alric Quan Entered By: Alric Quan on 02/14/2018 09:21:51 Timothy Gibson ([id number]) -------------------------------------------------------------------------------- Lower Extremity Assessment Details Patient Name: Timothy Gibson Date of Service: 02/14/2018 9:00 AM Medical Record Number: [id number] Patient Account Number: [id number] Date of Birth/Sex: [birth year] (67 y.o. M) Treating RN: Montey Hora Primary Care Leandrea Ackley: PATIENT, NO Other Clinician: Referring Long Brimage: Konrad Felix Treating Akil Hoos/Extender: Cathie Olden in Treatment: 11 Vascular Assessment Pulses: Dorsalis Pedis Palpable: [Right:Yes] Posterior Tibial Extremity colors, hair growth, and conditions: Extremity Color: [Right:Hyperpigmented] Hair Growth on Extremity: [Right:No] Temperature of Extremity: [Right:Warm] Capillary Refill: [Right:< 3 seconds] Toe Nail Assessment Left: Right: Thick: Yes Discolored: Yes Deformed: Yes Improper Length and Hygiene: No Electronic Signature(s) Signed: 02/14/2018 4:27:39 PM By: Montey Hora Entered By: Montey Hora on 02/14/2018 09:01:06 Timothy Gibson ([id number]) -------------------------------------------------------------------------------- Multi Wound Chart Details Patient Name: Timothy Gibson Date of Service: 02/14/2018 9:00 AM Medical Record Number: [id number] Patient Account Number: [id number] Date of Birth/Sex: 1950-07-29 (67 y.o. M) Treating RN: Ahmed Prima Primary Care Deniesha Stenglein: PATIENT, NO Other Clinician: Referring Heaven Meeker: Konrad Felix Treating Rory Montel/Extender: Cathie Olden in Treatment: 11 Vital Signs Height(in): 93 Pulse(bpm): 19 Weight(lbs): 220 Blood Pressure(mmHg): 197/87 Body Mass  Index(BMI): 27 Temperature(F): 97.4 Respiratory Rate 16 (breaths/min): Photos: [N/A:N/A] Wound Location: Right Toe Great N/A N/A Wounding Event: Gradually Appeared N/A N/A Primary Etiology: Diabetic Wound/Ulcer of the N/A N/A Lower Extremity Comorbid History: Lymphedema, Arrhythmia, N/A N/A Hypertension, Type II Diabetes, End Stage Renal Disease, Rheumatoid Arthritis, Osteoarthritis, Confinement Anxiety Date Acquired: 11/22/2017 N/A N/A Weeks of Treatment: 11 N/A N/A Wound Status: Open N/A N/A Measurements L x W x D 0.4x0.4x0.1 N/A N/A (cm) Area (cm) : 0.126 N/A N/A Volume (cm) : 0.013 N/A N/A % Reduction in Area: 94.70% N/A N/A % Reduction in Volume: 94.50% N/A N/A Classification: Grade 2 N/A N/A Exudate Amount: None Present N/A N/A Wound Margin: Well defined, not  attached N/A N/A Granulation Amount: None Present (0%) N/A N/A Necrotic Amount: None Present (0%) N/A N/A Exposed Structures: Fat Layer (Subcutaneous N/A N/A Tissue) Exposed: Yes Fascia: No Tendon: No Muscle: No Timothy Gibson, Timothy Gibson ([id number]) Joint: No Bone: No Epithelialization: Medium (34-66%) N/A N/A Debridement: Debridement - Excisional N/A N/A Pre-procedure 09:03 N/A N/A Verification/Time Out Taken: Pain Control: Lidocaine 4% Topical Solution N/A N/A Tissue Debrided: Callus, Subcutaneous, Slough N/A N/A Level: Skin/Subcutaneous Tissue N/A N/A Debridement Area (sq cm): 0.16 N/A N/A Instrument: Blade N/A N/A Bleeding: Minimum N/A N/A Hemostasis Achieved: Pressure N/A N/A Procedural Pain: 0 N/A N/A Post Procedural Pain: 0 N/A N/A Debridement Treatment Procedure was tolerated well N/A N/A Response: Post Debridement 0.5x0.5x0.2 N/A N/A Measurements L x W x D (cm) Post Debridement Volume: 0.039 N/A N/A (cm) Periwound Skin Texture: Callus: Yes N/A N/A Excoriation: No Induration: No Crepitus: No Rash: No Scarring: No Periwound Skin Moisture: Maceration: No N/A N/A Dry/Scaly:  No Periwound Skin Color: Atrophie Blanche: No N/A N/A Cyanosis: No Ecchymosis: No Erythema: No Hemosiderin Staining: No Mottled: No Pallor: No Rubor: No Temperature: No Abnormality N/A N/A Tenderness on Palpation: No N/A N/A Wound Preparation: Ulcer Cleansing: N/A N/A Rinsed/Irrigated with Saline Topical Anesthetic Applied: Other: lidocaine 4% Procedures Performed: Debridement N/A N/A Treatment Notes Electronic Signature(s) Signed: 02/14/2018 9:15:46 AM By: Lawanda Cousins Entered By: Lawanda Cousins on 02/14/2018 09:15:46 Timothy Gibson, Timothy Gibson ([id number]) -------------------------------------------------------------------------------- [place] Details Patient Name: Timothy Gibson Date of Service: 02/14/2018 9:00 AM Medical Record Number: [id number] Patient Account Number: [id number] Date of Birth/Sex: Dec 02, 1950 (67 y.o. M) Treating RN: Ahmed Prima Primary Care Maicee Ullman: PATIENT, NO Other Clinician: Referring Makaria Poarch: Konrad Felix Treating Zerek Litsey/Extender: Cathie Olden in Treatment: 11 Active Inactive ` Abuse / Safety / Falls / Self Care Management Nursing Diagnoses: Potential for falls Goals: Patient will remain injury free related to falls Date Initiated: 11/27/2017 Target Resolution Date: 12/14/2017 Goal Status: Active Interventions: Assess fall risk on admission and as needed Notes: ` Nutrition Nursing Diagnoses: Impaired glucose control: actual or potential Goals: Patient/caregiver verbalizes understanding of need to maintain therapeutic glucose control per primary care physician Date Initiated: 11/27/2017 Target Resolution Date: 12/28/2017 Goal Status: Active Interventions: Provide education on elevated blood sugars and impact on wound healing Notes: ` Orientation to the Wound Care Program Nursing Diagnoses: Knowledge deficit related to the wound healing center program Goals: Patient/caregiver will verbalize understanding  of the [place] Program Date Initiated: 11/27/2017 Target Resolution Date: 12/28/2017 Goal Status: Active Interventions: Timothy Gibson, Timothy Gibson ([id number]) Provide education on orientation to the wound center Notes: ` Soft Tissue Infection Nursing Diagnoses: Potential for infection: soft tissue Goals: Patient will remain free of wound infection Date Initiated: 11/27/2017 Target Resolution Date: 12/28/2017 Goal Status: Active Interventions: Assess signs and symptoms of infection every visit Treatment Activities: Systemic antibiotics : 11/27/2017 Notes: ` Wound/Skin Impairment Nursing Diagnoses: Impaired tissue integrity Goals: Ulcer/skin breakdown will have a volume reduction of 80% by week 12 Date Initiated: 11/27/2017 Target Resolution Date: 02/27/2018 Goal Status: Active Interventions: Assess patient/caregiver ability to perform ulcer/skin care regimen upon admission and as needed Treatment Activities: Referred to DME Tiera Mensinger for dressing supplies : 11/27/2017 Skin care regimen initiated : 11/27/2017 Notes: Electronic Signature(s) Signed: 02/14/2018 4:46:33 PM By: Alric Quan Entered By: Alric Quan on 02/14/2018 09:02:35 Timothy Gibson ([id number]) -------------------------------------------------------------------------------- Pain Assessment Details Patient Name: Timothy Gibson Date of Service: 02/14/2018 9:00 AM Medical Record Number: [id number] Patient Account Number: [id number] Date of Birth/Sex: 01-01-1951 (67 y.o.  M) Treating RN: Montey Hora Primary Care Jeroline Wolbert: PATIENT, NO Other Clinician: Referring Zackariah Vanderpol: Konrad Felix Treating Dacian Orrico/Extender: Cathie Olden in Treatment: 11 Active Problems Location of Pain Severity and Description of Pain Patient Has Paino No Site Locations Pain Management and Medication Current Pain Management: Electronic Signature(s) Signed: 02/14/2018 4:27:39 PM By: Montey Hora Entered By:  Montey Hora on 02/14/2018 08:56:00 Timothy Gibson ([id number]) -------------------------------------------------------------------------------- Patient/Caregiver Education Details Patient Name: Timothy Gibson Date of Service: 02/14/2018 9:00 AM Medical Record Number: [id number] Patient Account Number: [id number] Date of Birth/Gender: [birth year] (67 y.o. M) Treating RN: Ahmed Prima Primary Care Physician: PATIENT, NO Other Clinician: Referring Physician: Konrad Felix Treating Physician/Extender: Cathie Olden in Treatment: 11 Education Assessment Education Provided To: Patient Education Topics Provided Wound Debridement: Handouts: Wound Debridement Methods: Explain/Verbal Responses: State content correctly Wound/Skin Impairment: Methods: Explain/Verbal Responses: State content correctly Electronic Signature(s) Signed: 02/14/2018 4:46:33 PM By: Alric Quan Entered By: Alric Quan on 02/14/2018 09:22:09 Timothy Gibson ([id number]) -------------------------------------------------------------------------------- Wound Assessment Details Patient Name: Timothy Gibson Date of Service: 02/14/2018 9:00 AM Medical Record Number: [id number] Patient Account Number: [id number] Date of Birth/Sex: [birth year] (67 y.o. M) Treating RN: Montey Hora Primary Care Ellis Koffler: PATIENT, NO Other Clinician: Referring Alis Sawchuk: Konrad Felix Treating Karel Mowers/Extender: Cathie Olden in Treatment: 11 Wound Status Wound Number: 1 Primary Diabetic Wound/Ulcer of the Lower Extremity Etiology: Wound Location: Right Toe Great Wound Open Wounding Event: Gradually Appeared Status: Date Acquired: 11/22/2017 Comorbid Lymphedema, Arrhythmia, Hypertension, Type II Weeks Of Treatment: 11 History: Diabetes, End Stage Renal Disease, Clustered Wound: No Rheumatoid Arthritis, Osteoarthritis, Confinement Anxiety Photos Photo Uploaded By: Montey Hora on 02/14/2018  09:09:46 Wound Measurements Length: (cm) 0.4 Width: (cm) 0.4 Depth: (cm) 0.1 Area: (cm) 0.126 Volume: (cm) 0.013 % Reduction in Area: 94.7% % Reduction in Volume: 94.5% Epithelialization: Medium (34-66%) Tunneling: No Undermining: No Wound Description Classification: Grade 2 Foul O Wound Margin: Well defined, not attached Slough Exudate Amount: None Present dor After Cleansing: No /Fibrino Yes Wound Bed Granulation Amount: None Present (0%) Exposed Structure Necrotic Amount: None Present (0%) Fascia Exposed: No Fat Layer (Subcutaneous Tissue) Exposed: Yes Tendon Exposed: No Muscle Exposed: No Joint Exposed: No Bone Exposed: No Periwound Skin Texture Texture Color Timothy Gibson, Timothy Gibson ([id number]) No Abnormalities Noted: No No Abnormalities Noted: No Callus: Yes Atrophie Blanche: No Crepitus: No Cyanosis: No Excoriation: No Ecchymosis: No Induration: No Erythema: No Rash: No Hemosiderin Staining: No Scarring: No Mottled: No Pallor: No Moisture Rubor: No No Abnormalities Noted: No Dry / Scaly: No Temperature / Pain Maceration: No Temperature: No Abnormality Wound Preparation Ulcer Cleansing: Rinsed/Irrigated with Saline Topical Anesthetic Applied: Other: lidocaine 4%, Treatment Notes Wound #1 (Right Toe Great) 1. Cleansed with: Clean wound with Normal Saline 2. Anesthetic Topical Lidocaine 4% cream to wound bed prior to debridement 4. Dressing Applied: Prisma Ag 5. Secondary Dressing Applied Dry Gauze Kerlix/Conform Electronic Signature(s) Signed: 02/14/2018 4:27:39 PM By: Montey Hora Entered By: Montey Hora on 02/14/2018 09:00:04 Timothy Gibson ([id number]) -------------------------------------------------------------------------------- [place] Details Patient Name: Timothy Gibson Date of Service: 02/14/2018 9:00 AM Medical Record Number: [id number] Patient Account Number: [id number] Date of Birth/Sex: March 12, 1951 (67 y.o. M) Treating  RN: Montey Hora Primary Care Yuchen Fedor: PATIENT, NO Other Clinician: Referring Kemberly Taves: Konrad Felix Treating Briselda Naval/Extender: Cathie Olden in Treatment: 11 Vital Signs Time Taken: 08:56 Temperature (F): 97.4 Height (in): 76 Pulse (bpm): 85 Weight (lbs): 220 Respiratory Rate (breaths/min): 16 Body Mass Index (BMI): 26.8 Blood Pressure (mmHg): 197/87 Reference Range: 80 -  120 mg / dl Electronic Signature(s) Signed: 02/14/2018 4:27:39 PM By: Montey Hora Entered By: Montey Hora on 02/14/2018 08:56:17

## 2018-02-25 NOTE — Progress Notes (Signed)
Timothy Gibson ([id number]) Visit Report for 02/14/2018 Chief Complaint Document Details Patient Name: Timothy Gibson, Timothy Gibson Date of Service: 02/14/2018 9:00 AM Medical Record Number: [id number] Patient Account Number: [id number] Date of Birth/Sex: January 18, 1951 (67 y.o. M) Treating RN: Ahmed Prima Primary Care Provider: PATIENT, NO Other Clinician: Referring Provider: Konrad Felix Treating Provider/Extender: Cathie Olden in Treatment: 11 Information Obtained from: Patient Chief Complaint Right Great Toe Electronic Signature(s) Signed: 02/14/2018 9:18:55 AM By: Lawanda Cousins Entered By: Lawanda Cousins on 02/14/2018 09:18:54 Timothy Gibson ([id number]) -------------------------------------------------------------------------------- Debridement Details Patient Name: Timothy Gibson Date of Service: 02/14/2018 9:00 AM Medical Record Number: [id number] Patient Account Number: [id number] Date of Birth/Sex: 28-Jun-1951 (67 y.o. M) Treating RN: Ahmed Prima Primary Care Provider: PATIENT, NO Other Clinician: Referring Provider: Konrad Felix Treating Provider/Extender: Cathie Olden in Treatment: 11 Debridement Performed for Wound #1 Right Toe Great Assessment: Performed By: Physician Lawanda Cousins, NP Debridement Type: Debridement Severity of Tissue Pre Fat layer exposed Debridement: Pre-procedure Verification/Time Yes - 09:03 Out Taken: Start Time: 09:03 Pain Control: Lidocaine 4% Topical Solution Total Area Debrided (L x W): 0.4 (cm) x 0.4 (cm) = 0.16 (cm) Tissue and other material Viable, Callus, Subcutaneous, Fibrin/Exudate debrided: Level: Skin/Subcutaneous Tissue Debridement Description: Excisional Instrument: Blade Bleeding: Minimum Hemostasis Achieved: Pressure End Time: 09:08 Procedural Pain: 0 Post Procedural Pain: 0 Response to Treatment: Procedure was tolerated well Level of Consciousness: Awake and Alert Post Debridement Measurements of  Total Wound Length: (cm) 0.5 Width: (cm) 0.5 Depth: (cm) 0.2 Volume: (cm) 0.039 Character of Wound/Ulcer Post Debridement: Requires Further Debridement Severity of Tissue Post Debridement: Fat layer exposed Post Procedure Diagnosis Same as Pre-procedure Electronic Signature(s) Signed: 02/14/2018 9:16:08 AM By: Lawanda Cousins Signed: 02/14/2018 4:46:33 PM By: Alric Quan Entered By: Lawanda Cousins on 02/14/2018 09:16:08 Timothy Gibson ([id number]) -------------------------------------------------------------------------------- HPI Details Patient Name: Timothy Gibson Date of Service: 02/14/2018 9:00 AM Medical Record Number: [id number] Patient Account Number: [id number] Date of Birth/Sex: [birth year] (67 y.o. M) Treating RN: Ahmed Prima Primary Care Provider: PATIENT, NO Other Clinician: Referring Provider: Konrad Felix Treating Provider/Extender: Cathie Olden in Treatment: 11 History of Present Illness HPI Description: 11/27/17 on evaluation today patient presents for initial evaluation concerning an issue that he has been having with his right great toe which began last Thursday 11/22/17. He has a history of diabetes mellitus type to which he has had for greater than 30 years currently he is on insulin. Subsequently he also has a history of hypertension. On physical exam inspection is also appears he may have some peripheral vascular disease with his ABI's being noncompressible bilaterally. He is on dialysis as well and this is on Monday, Wednesday, and Friday. Patient was hospitalized back in January/February 2019 although this was more for stomach/back pain though they never told me exactly what was going on. This is according to the patient. Subsequently the ulcer which is between the third and fourth toe when space of the right foot as well is on the plantar aspect of the fourth toe is due to him having cleaned his toes this morning and he states that his finger  which is large split the toe tissue in between causing the wounds that we currently see. With that being said he states this has happened before I explained I would definitely recommend that he not do this any longer. He can use a small soft washcloth to clean between the toes without causing trauma. Subsequently the right great toe hatchery does show evidence of  necrotic tissue present on the surface of the wound specifically there is callous and dead skin surrounding which is trapping fluid causing problems as far as the wound is concerned. The surface of the wound does show slough although due to his vascular flow I'm not going to sharply debride this today I think I will selectively debride away the necrotic skin as well is callous from around the surrounding so this will not continue to be a moisture issue. Nonetheless the patient has no pain he does have diabetic neuropathy. 12/06/17-He is here in follow-up evaluation for a right great toe ulcer. He is voicing no complaints or concerns. He continues to infrequently/socially smokes cigars with no desire to quit. He has been compliant with offloading, using open toed surgical shoe; has been compliant using Santyl daily. He continues on clindamycin although takes it inconsistently secondary to indigestion. The plain film x-ray performed on 5/23 impression: Soft tissue swelling of the left first toe and is noted with probable irregular lucency involving distal tuft of first distal phalanx concerning for osteomyelitis. MRI may be performed for further evaluation; MRI ordered. The vascular evaluation performed on 5/24 field non-compressible ABIs bilaterally and reduced TBI bilaterally suggesting significant tibial disease. He will be referred to vascular medicine for further evaluation. 12/13/17-He is here in follow-up evaluation for right great toe ulcer. He has appointment next Thursday for the MRI and vascular evaluation. Wound culture obtained last  week grew abundant Klebsiella oxytoca (multidrug sensitivity), and abundant enteric coccus faecalis (sensitive to ampicillin). Ampicillin and Cipro were called in, along with a probiotic. In light of his appointments next Thursday he will follow-up in 2 weeks, we will continue with Santyl 12/27/17-He is here for evaluation for a right great toe ulcer. 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 He did have evaluation by vascular medicine, Dr. Ronalee Belts, on 6/13, at that appointment it was decided for him to undergo angiography with possible intervention but he refused and is still considering. If he chooses to have it done he will contact the office. He has not heard back from either ID offices that he was referred to two weeks ago: [place] and [place]. there is improvement in both appearance and measurement to the ulcer. We will extend antibiotic therapy (amoxicillin 500 every 12, Cipro 500 daily) for additional 2 weeks pending infectious disease consult, he will continue with Santyl daily. He was reminded to continue with probiotic therapy while on oral antibiotics; he denies any GI disturbance. 01/03/18-He is here in follow-up evaluation for right great toe ulcer. He is decided to not undergo any vascular intervention at this time. He was established with [facility] infectious disease (Dr. Linus Salmons) last week initiated on vancomycin and cefazolin with dialysis treatment for 6 weeks. We will continue with current treatment plan with Santyl and offloading and he will follow- up in 2 weeks 01/17/18-He is seen in follow-up evaluation for right great toe ulcer. There is improvement in appearance with less nonviable tissue present. We will continue with same treatment plan he will follow-up next week. He is tolerating IV antibiotics without any complications 03/08/55-OZ is seen in follow up evaluation for right great toe ulcer. There  continues to be improvement. He is tolerating IV antibiotics with hemodialysis, completion date of 7/31. We will transition to collagen and and follow-up next week 02/07/18-He is here in follow up for a right great toe ulcer. There is red granulation tissue throughout  the wound, improved in appearance. He completed antibiotics yesterday. He saw podiatry last Thursday for nail trimming, at that appointment he periwound callus was trimmed and a culture was obtained; culture negative. He does not have a follow up appointment with ROBEN, SCHLIEP. ([id number]) infectious disease. We will submit for grafix. 02/14/18-He is here in follow-up evaluation for a right great toe ulcer. There is slow improvement, red granulation tissue throughout. The insurance approval for grafix is pending. We will continue with collagen and he'll follow-up next week Electronic Signature(s) Signed: 02/14/2018 9:20:14 AM By: Lawanda Cousins Entered By: Lawanda Cousins on 02/14/2018 09:20:13 VRISHANK, MOSTER ([id number]) -------------------------------------------------------------------------------- Physician Orders Details Patient Name: Timothy Gibson Date of Service: 02/14/2018 9:00 AM Medical Record Number: [id number] Patient Account Number: [id number] Date of Birth/Sex: 1950-08-20 (67 y.o. M) Treating RN: Ahmed Prima Primary Care Provider: PATIENT, NO Other Clinician: Referring Provider: Konrad Felix Treating Provider/Extender: Cathie Olden in Treatment: 11 Verbal / Phone Orders: Yes Clinician: Carolyne Fiscal, Debi Read Back and Verified: Yes Diagnosis Coding Wound Cleansing Wound #1 Right Toe Great o Clean wound with Normal Saline. o Cleanse wound with mild soap and water Anesthetic (add to Medication List) Wound #1 Right Toe Great o Topical Lidocaine 4% cream applied to wound bed prior to debridement (In Clinic Only). Primary Wound Dressing Wound #1 Right Toe Great o Silver Collagen - lightly  moisten with saline Secondary Dressing Wound #1 Right Toe Great o Gauze and Kerlix/Conform Dressing Change Frequency Wound #1 Right Toe Great o Change dressing every other day. Follow-up Appointments Wound #1 Right Toe Great o Return Appointment in 1 week. Edema Control Wound #1 Right Toe Great o Elevate legs to the level of the heart and pump ankles as often as possible Off-Loading Wound #1 Right Toe Great o Open toe surgical shoe to: Additional Orders / Instructions Wound #1 Right Toe Great o Increase protein intake. Patient Medications Allergies: no known allergies AQUIL, DUHE ([id number]) Notifications Medication Indication Start End lidocaine DOSE 1 - topical 4 % cream - 1 cream topical Electronic Signature(s) Signed: 02/14/2018 4:46:33 PM By: Alric Quan Signed: 02/14/2018 5:08:35 PM By: Lawanda Cousins Entered By: Alric Quan on 02/14/2018 09:10:10 DEBBIE, BELLUCCI ([id number]) -------------------------------------------------------------------------------- Prescription 02/14/2018 Patient Name: Timothy Gibson Provider: Lawanda Cousins NP Date of Birth: 05/28/1951 NPI#: [id number] Sex: M DEA#: [id number] Phone #: [phone] License #: Patient Address: [facility] [place], [place] [address] [address] [place], [place] [address] [phone] Allergies no known allergies Medication Medication: Route: Strength: Form: lidocaine topical 4% cream Class: TOPICAL LOCAL ANESTHETICS Dose: Frequency / Time: Indication: 1 1 cream topical Number of Refills: Number of Units: 0 Generic Substitution: Start Date: End Date: Administered at Substitution Permitted Facility: Yes Time Administered: Time Discontinued: Note to Pharmacy: Signature(s): Date(s): Electronic Signature(s) Signed: 02/14/2018 4:46:33 PM By: Alric Quan Signed: 02/14/2018  5:08:35 PM By: Lawanda Cousins Entered By: Alric Quan on 02/14/2018 09:10:11 MATTHEU, BRODERSEN ([id number]) JARMAR, ROUSSEAU ([id number]) --------------------------------------------------------------------------------  Problem List Details Patient Name: Timothy Gibson Date of Service: 02/14/2018 9:00 AM Medical Record Number: [id number] Patient Account Number: [id number] Date of Birth/Sex: June 11, 1951 (67 y.o. 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 11/27/2017 No  Yes layer exposed E11.621 Type 2 diabetes mellitus with foot ulcer 11/27/2017 No Yes I70.235 Atherosclerosis of native arteries of right leg with ulceration of 12/06/2017 No Yes other part of foot M86.671 Other chronic osteomyelitis, right ankle and foot 12/27/2017 No Yes I10 Essential (primary) hypertension 11/27/2017 No Yes N18.6 End stage renal disease 11/27/2017 No Yes Inactive Problems Resolved Problems ICD-10 Code Description Active Date Resolved Date E44.1 Mild protein-calorie malnutrition 11/27/2017 11/27/2017 Electronic Signature(s) Signed: 02/14/2018 9:15:39 AM By: Lawanda Cousins Entered By: Lawanda Cousins on 02/14/2018 09:15:38 Timothy Gibson ([id number]) -------------------------------------------------------------------------------- Progress Note Details Patient Name: Timothy Gibson Date of Service: 02/14/2018 9:00 AM Medical Record Number: [id number] Patient Account Number: [id number] Date of Birth/Sex: 02-08-1951 (67 y.o. M) Treating RN: Ahmed Prima Primary Care Provider: PATIENT, NO Other Clinician: Referring Provider: Konrad Felix Treating Provider/Extender: Cathie Olden in Treatment:  11 Subjective Chief Complaint Information obtained from Patient Right Great Toe History of Present Illness (HPI) 11/27/17 on evaluation today patient presents for initial evaluation concerning an issue that he has been having with his right great toe which began last Thursday 11/22/17. He has a history of diabetes mellitus type to which he has had for greater than 30 years currently he is on insulin. Subsequently he also has a history of hypertension. On physical exam inspection is also appears he may have some peripheral vascular disease with his ABI's being noncompressible bilaterally. He is on dialysis as well and this is on Monday, Wednesday, and Friday. Patient was hospitalized back in January/February 2019 although this was more for stomach/back pain though they never told me exactly what was going on. This is according to the patient. Subsequently the ulcer which is between the third and fourth toe when space of the right foot as well is on the plantar aspect of the fourth toe is due to him having cleaned his toes this morning and he states that his finger which is large split the toe tissue in between causing the wounds that we currently see. With that being said he states this has happened before I explained I would definitely recommend that he not do this any longer. He can use a small soft washcloth to clean between the toes without causing trauma. Subsequently the right great toe hatchery does show evidence of necrotic tissue present on the surface of the wound specifically there is callous and dead skin surrounding which is trapping fluid causing problems as far as the wound is concerned. The surface of the wound does show slough although due to his vascular flow I'm not going to sharply debride this today I think I will selectively debride away the necrotic skin as well is callous from around the surrounding so this will not continue to be a moisture issue. Nonetheless the patient has  no pain he does have diabetic neuropathy. 12/06/17-He is here in follow-up evaluation for a right great toe ulcer. He is voicing no complaints or concerns. He continues to infrequently/socially smokes cigars with no desire to quit. He has been compliant with offloading, using open toed surgical shoe; has been compliant using Santyl daily. He continues on clindamycin although takes it inconsistently secondary to indigestion. The plain film x-ray performed on 5/23 impression: Soft tissue swelling of the left first toe and is noted with probable irregular lucency involving distal tuft of first distal phalanx concerning for osteomyelitis. MRI may be performed for further evaluation; MRI ordered. The vascular evaluation performed on 5/24 field non-compressible ABIs bilaterally and reduced TBI bilaterally  suggesting significant tibial disease. He will be referred to vascular medicine for further evaluation. 12/13/17-He is here in follow-up evaluation for right great toe ulcer. He has appointment next Thursday for the MRI and vascular evaluation. Wound culture obtained last week grew abundant Klebsiella oxytoca (multidrug sensitivity), and abundant enteric coccus faecalis (sensitive to ampicillin). Ampicillin and Cipro were called in, along with a probiotic. In light of his appointments next Thursday he will follow-up in 2 weeks, we will continue with Santyl 12/27/17-He is here for evaluation for a right great toe ulcer. 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 He did have evaluation by vascular medicine, Dr. Ronalee Belts, on 6/13, at that appointment it was decided for him to undergo angiography with possible intervention but he refused and is still considering. If he chooses to have it done he will contact the office. He has not heard back from either ID offices that he was referred to two weeks ago: [place] and [place]. there is  improvement in both appearance and measurement to the ulcer. We will extend antibiotic therapy (amoxicillin 500 every 12, Cipro 500 daily) for additional 2 weeks pending infectious disease consult, he will continue with Santyl daily. He was reminded to continue with probiotic therapy while on oral antibiotics; he denies any GI disturbance. 01/03/18-He is here in follow-up evaluation for right great toe ulcer. He is decided to not undergo any vascular intervention at this time. He was established with [facility] infectious disease (Dr. Linus Salmons) last week initiated on vancomycin and cefazolin with dialysis treatment for 6 weeks. We will continue with current treatment plan with Santyl and offloading and he will follow- up in 2 weeks 01/17/18-He is seen in follow-up evaluation for right great toe ulcer. There is improvement in appearance with less nonviable tissue present. We will continue with same treatment plan he will follow-up next week. He is tolerating IV antibiotics without GANNON, HEINZMAN. ([id number]) any complications 0/78/67-JQ is seen in follow up evaluation for right great toe ulcer. There continues to be improvement. He is tolerating IV antibiotics with hemodialysis, completion date of 7/31. We will transition to collagen and and follow-up next week 02/07/18-He is here in follow up for a right great toe ulcer. There is red granulation tissue throughout the wound, improved in appearance. He completed antibiotics yesterday. He saw podiatry last Thursday for nail trimming, at that appointment he periwound callus was trimmed and a culture was obtained; culture negative. He does not have a follow up appointment with infectious disease. We will submit for grafix. 02/14/18-He is here in follow-up evaluation for a right great toe ulcer. There is slow improvement, red granulation tissue throughout. The insurance approval for grafix is pending. We will continue with collagen and he'll follow-up next  week Patient History Information obtained from Patient. Family History Cancer - Mother,Father, Diabetes - Mother, Heart Disease - Mother, Hypertension - Mother,Father, Stroke - Father, Thyroid Problems - Mother, No family history of Kidney Disease, Lung Disease, Seizures, Tuberculosis. Social History Former smoker - cigar about 4 to 5 days, Marital Status - Divorced, Alcohol Use - Never, Drug Use - No History, Caffeine Use - Daily. Medical History Hospitalization/Surgery History - 08/11/2017, [facility], stomach and back pain. Review of Systems (ROS) Constitutional Symptoms (General Health) Denies complaints or symptoms of Fatigue, Fever, Chills. Respiratory Denies complaints or symptoms of Shortness of Breath. Cardiovascular Denies complaints or symptoms of Chest pain, LE edema. Gastrointestinal Denies complaints or  symptoms of Frequent diarrhea, Nausea, Vomiting. Objective Constitutional Vitals Time Taken: 8:56 AM, Height: 76 in, Weight: 220 lbs, BMI: 26.8, Temperature: 97.4 F, Pulse: 85 bpm, Respiratory Rate: 16 breaths/min, Blood Pressure: 197/87 mmHg. Integumentary (Hair, Skin) Wound #1 status is Open. Original cause of wound was Gradually Appeared. The wound is located on the Right Toe Great. The wound measures 0.4cm length x 0.4cm width x 0.1cm depth; 0.126cm^2 area and 0.013cm^3 volume. There is Fat Layer (Subcutaneous Tissue) Exposed exposed. There is no tunneling or undermining noted. There is a none present amount of drainage noted. The wound margin is well defined and not attached to the wound base. There is no granulation within the wound bed. There is no necrotic tissue within the wound bed. The periwound skin appearance exhibited: Callus. The periwound skin appearance did not exhibit: Crepitus, Excoriation, Induration, Rash, Scarring, Dry/Scaly, Maceration, BRADSHAW, MINIHAN. ([id number]) Blanche, Cyanosis, Ecchymosis, Hemosiderin Staining, Mottled, Pallor, Rubor,  Erythema. Periwound temperature was noted as No Abnormality. 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 There was a Excisional Skin/Subcutaneous Tissue Debridement with a total area of 0.16 sq cm performed by Lawanda Cousins, NP. With the following instrument(s): Blade to remove Viable tissue/material. Material removed includes Callus, Subcutaneous Tissue, and Fibrin/Exudate after achieving pain control using Lidocaine 4% Topical Solution. No specimens were taken. A time out was conducted at 09:03, prior to the start of the procedure. A Minimum amount of bleeding was controlled with Pressure. The procedure was tolerated well with a pain level of 0 throughout and a pain level of 0 following the procedure. Patient s Level of Consciousness post procedure was recorded as Awake and Alert. Post Debridement Measurements: 0.5cm length x 0.5cm width x 0.2cm depth; 0.039cm^3 volume. Character of Wound/Ulcer Post Debridement requires further debridement. Severity of Tissue Post Debridement is: Fat layer exposed. Post procedure Diagnosis Wound #1: Same as Pre-Procedure Plan Wound Cleansing: Wound #1 Right Toe Great: Clean wound with Normal Saline. Cleanse wound with mild soap and water Anesthetic (add to Medication List): Wound #1 Right Toe Great: Topical Lidocaine 4% cream applied to wound bed prior to debridement (In Clinic Only). Primary Wound Dressing: Wound #1 Right Toe Great: Silver Collagen - lightly  moisten with saline Secondary Dressing: Wound #1 Right Toe GreatLUPE, BONNER ([id number]) Gauze and Kerlix/Conform Dressing Change Frequency: Wound #1 Right Toe Great: Change dressing every other day. Follow-up Appointments: Wound #1 Right Toe Great: Return Appointment in 1 week. Edema Control: Wound #1 Right Toe Great: Elevate legs to the level of the heart and pump ankles as often as possible Off-Loading: Wound #1 Right Toe Great: Open toe surgical shoe to: Additional Orders / Instructions: Wound #1 Right Toe Great: Increase protein intake. The following medication(s) was prescribed: lidocaine topical 4 % cream 1 1 cream topical was prescribed at facility Electronic Signature(s) Signed: 02/14/2018 9:21:38 AM By: Lawanda Cousins Entered By: Lawanda Cousins on 02/14/2018 09:21:38 SELWYN, REASON ([id number]) -------------------------------------------------------------------------------- ROS/PFSH Details Patient Name: Timothy Gibson Date of Service: 02/14/2018 9:00 AM Medical Record Number: [id number] Patient  Account Number: [id number] Date of Birth/Sex: 1950-09-22 (67 y.o. M) Treating RN: Ahmed Prima Primary Care Provider: PATIENT, NO Other Clinician: Referring Provider: Konrad Felix Treating Provider/Extender: Cathie Olden in Treatment: 11 Information Obtained From Patient Wound History Constitutional Symptoms (General Health) Complaints and Symptoms: Negative for: Fatigue; Fever; Chills Respiratory Complaints and Symptoms: Negative for: Shortness of Breath Medical History: Negative for: Aspiration; Asthma; Chronic Obstructive Pulmonary Disease (COPD); Pneumothorax; Sleep Apnea; Tuberculosis Cardiovascular Complaints and Symptoms: Negative for: Chest pain; LE edema Medical History: Positive for: Arrhythmia; Hypertension Negative for: Congestive Heart Failure; Coronary Artery Disease; Deep Vein Thrombosis; Hypotension; Myocardial Infarction;  Peripheral Arterial Disease; Peripheral Venous Disease; Phlebitis; Vasculitis Gastrointestinal Complaints and Symptoms: Negative for: Frequent diarrhea; Nausea; Vomiting Medical History: Negative for: Cirrhosis ; Colitis; Crohnos; Hepatitis A; Hepatitis B; Hepatitis C Eyes Medical History: Negative for: Cataracts; Glaucoma; Optic Neuritis Ear/Nose/Mouth/Throat Medical History: Negative for: Chronic sinus problems/congestion; Middle ear problems Hematologic/Lymphatic Medical History: Positive for: Lymphedema JAYDENCE, ARNESEN ([id number]) Negative for: Anemia; Hemophilia; Human Immunodeficiency Virus; Sickle Cell Disease Endocrine Medical History: Positive for: Type II Diabetes Time with diabetes: 30 years Treated with: Diet Blood sugar tested every day: No Genitourinary Medical History: Positive for: End Stage Renal Disease Immunological Medical History: Negative for: Lupus Erythematosus; Raynaudos Musculoskeletal Medical History: Positive for: Rheumatoid Arthritis; Osteoarthritis Negative for: Gout; Osteomyelitis Neurologic Medical History: Negative for: Dementia; Neuropathy; Quadriplegia; Paraplegia; Seizure Disorder Oncologic Medical History: Negative for: Received Chemotherapy; Received Radiation Psychiatric Medical History: Positive for: Confinement Anxiety Negative for: Anorexia/bulimia Immunizations Pneumococcal Vaccine: Received Pneumococcal Vaccination: Yes Implantable Devices Hospitalization / Surgery History Name of Hospital Purpose of Hospitalization/Surgery Date [facility] stomach and back pain 08/11/2017 Family and Social History Cancer: Yes - Mother,Father; Diabetes: Yes - Mother; Heart Disease: Yes - Mother; Hypertension: Yes - Mother,Father; Kidney Disease: No; Lung Disease: No; Seizures: No; Stroke: Yes - Father; Thyroid Problems: Yes - Mother; Tuberculosis: No; Former smoker - cigar about 4 to 5 days; Marital Status - Divorced; Alcohol Use: Never; Drug  Use: No History; Caffeine Use: Daily; Financial Concerns: No; Food, Games developer or Shelter Needs: No; Support System Lacking: No; Transportation AGUSTIN, SWATEK ([id number]) Concerns: No; Advanced Directives: Yes (Not Provided); Patient does not want information on Advanced Directives; Do not resuscitate: No; Living Will: Yes (Not Provided); Medical Power of Attorney: Yes (Not Provided) Physician Affirmation I have reviewed and agree with the above information. Electronic Signature(s) Signed: 02/14/2018 4:46:33 PM By: Alric Quan Signed: 02/14/2018 5:08:35 PM By: Lawanda Cousins Entered By: Lawanda Cousins on 02/14/2018 09:21:25 LENDON, GEORGE ([id number]) -------------------------------------------------------------------------------- [place] Details Patient Name: Timothy Gibson Date of Service: 02/14/2018 Medical Record Number: [id number] Patient Account Number: [id number] Date of Birth/Sex: [birth year] (67 y.o. 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, right ankle and foot I10 Essential (primary) hypertension N18.6 End stage renal disease Facility Procedures CPT4 Code: [id number] Description: 11042 - DEB SUBQ TISSUE 20 SQ CM/< ICD-10 Diagnosis Description L97.512 Non-pressure chronic ulcer of other part of right foot with fat l Modifier: ayer exposed Quantity: 1 Physician Procedures CPT4 Code: [id number] Description: 11042 - WC PHYS SUBQ TISS 20 SQ CM ICD-10 Diagnosis Description L97.512  Non-pressure chronic ulcer of other part of  right foot with fat l Modifier: ayer exposed Quantity: 1 Electronic Signature(s) Signed: 02/14/2018 9:22:33 AM By: Lawanda Cousins Entered By: Lawanda Cousins on 02/14/2018 09:22:32

## 2018-02-27 DIAGNOSIS — N2581 Secondary hyperparathyroidism of renal origin: Secondary | ICD-10-CM | POA: Diagnosis not present

## 2018-02-27 DIAGNOSIS — D631 Anemia in chronic kidney disease: Secondary | ICD-10-CM | POA: Diagnosis not present

## 2018-02-27 DIAGNOSIS — N186 End stage renal disease: Secondary | ICD-10-CM | POA: Diagnosis not present

## 2018-02-27 DIAGNOSIS — Z992 Dependence on renal dialysis: Secondary | ICD-10-CM | POA: Diagnosis not present

## 2018-02-27 DIAGNOSIS — D509 Iron deficiency anemia, unspecified: Secondary | ICD-10-CM | POA: Diagnosis not present

## 2018-02-28 ENCOUNTER — Encounter: Payer: Medicare Other | Admitting: Nurse Practitioner

## 2018-02-28 DIAGNOSIS — L97512 Non-pressure chronic ulcer of other part of right foot with fat layer exposed: Secondary | ICD-10-CM | POA: Diagnosis not present

## 2018-02-28 DIAGNOSIS — M86671 Other chronic osteomyelitis, right ankle and foot: Secondary | ICD-10-CM | POA: Diagnosis not present

## 2018-02-28 DIAGNOSIS — E1169 Type 2 diabetes mellitus with other specified complication: Secondary | ICD-10-CM | POA: Diagnosis not present

## 2018-02-28 DIAGNOSIS — E1151 Type 2 diabetes mellitus with diabetic peripheral angiopathy without gangrene: Secondary | ICD-10-CM | POA: Diagnosis not present

## 2018-02-28 DIAGNOSIS — E1122 Type 2 diabetes mellitus with diabetic chronic kidney disease: Secondary | ICD-10-CM | POA: Diagnosis not present

## 2018-02-28 DIAGNOSIS — E11621 Type 2 diabetes mellitus with foot ulcer: Secondary | ICD-10-CM | POA: Diagnosis not present

## 2018-03-01 DIAGNOSIS — D631 Anemia in chronic kidney disease: Secondary | ICD-10-CM | POA: Diagnosis not present

## 2018-03-01 DIAGNOSIS — Z992 Dependence on renal dialysis: Secondary | ICD-10-CM | POA: Diagnosis not present

## 2018-03-01 DIAGNOSIS — D509 Iron deficiency anemia, unspecified: Secondary | ICD-10-CM | POA: Diagnosis not present

## 2018-03-01 DIAGNOSIS — N2581 Secondary hyperparathyroidism of renal origin: Secondary | ICD-10-CM | POA: Diagnosis not present

## 2018-03-01 DIAGNOSIS — N186 End stage renal disease: Secondary | ICD-10-CM | POA: Diagnosis not present

## 2018-03-04 DIAGNOSIS — N2581 Secondary hyperparathyroidism of renal origin: Secondary | ICD-10-CM | POA: Diagnosis not present

## 2018-03-04 DIAGNOSIS — N186 End stage renal disease: Secondary | ICD-10-CM | POA: Diagnosis not present

## 2018-03-04 DIAGNOSIS — D631 Anemia in chronic kidney disease: Secondary | ICD-10-CM | POA: Diagnosis not present

## 2018-03-04 DIAGNOSIS — D509 Iron deficiency anemia, unspecified: Secondary | ICD-10-CM | POA: Diagnosis not present

## 2018-03-04 DIAGNOSIS — Z992 Dependence on renal dialysis: Secondary | ICD-10-CM | POA: Diagnosis not present

## 2018-03-06 DIAGNOSIS — Z992 Dependence on renal dialysis: Secondary | ICD-10-CM | POA: Diagnosis not present

## 2018-03-06 DIAGNOSIS — N186 End stage renal disease: Secondary | ICD-10-CM | POA: Diagnosis not present

## 2018-03-06 DIAGNOSIS — D509 Iron deficiency anemia, unspecified: Secondary | ICD-10-CM | POA: Diagnosis not present

## 2018-03-06 DIAGNOSIS — D631 Anemia in chronic kidney disease: Secondary | ICD-10-CM | POA: Diagnosis not present

## 2018-03-06 DIAGNOSIS — N2581 Secondary hyperparathyroidism of renal origin: Secondary | ICD-10-CM | POA: Diagnosis not present

## 2018-03-07 ENCOUNTER — Encounter: Payer: Medicare Other | Admitting: Nurse Practitioner

## 2018-03-07 DIAGNOSIS — M86671 Other chronic osteomyelitis, right ankle and foot: Secondary | ICD-10-CM | POA: Diagnosis not present

## 2018-03-07 DIAGNOSIS — E1169 Type 2 diabetes mellitus with other specified complication: Secondary | ICD-10-CM | POA: Diagnosis not present

## 2018-03-07 DIAGNOSIS — E11621 Type 2 diabetes mellitus with foot ulcer: Secondary | ICD-10-CM | POA: Diagnosis not present

## 2018-03-07 DIAGNOSIS — L97512 Non-pressure chronic ulcer of other part of right foot with fat layer exposed: Secondary | ICD-10-CM | POA: Diagnosis not present

## 2018-03-07 DIAGNOSIS — E1122 Type 2 diabetes mellitus with diabetic chronic kidney disease: Secondary | ICD-10-CM | POA: Diagnosis not present

## 2018-03-07 DIAGNOSIS — E1151 Type 2 diabetes mellitus with diabetic peripheral angiopathy without gangrene: Secondary | ICD-10-CM | POA: Diagnosis not present

## 2018-03-07 NOTE — Progress Notes (Signed)
DAYVON, DAX ([id number]) Visit Report for 02/21/2018 Chief Complaint Document Details Patient Name: Timothy Gibson, Timothy Gibson Date of Service: 02/21/2018 9:00 AM Medical Record Number: [id number] Patient Account Number: [id number] Date of Birth/Sex: [birth year] (67 y.o. M) Treating RN: Roger Shelter Primary Care Provider: PATIENT, NO Other Clinician: Referring Provider: Konrad Felix Treating Provider/Extender: Cathie Olden in Treatment: 12 Information Obtained from: Patient Chief Complaint Right Great Toe Electronic Signature(s) Signed: 02/21/2018 9:24:24 AM By: Lawanda Cousins Entered By: Lawanda Cousins on 02/21/2018 09:24:24 Timothy Gibson ([id number]) -------------------------------------------------------------------------------- Cellular or Tissue Based Product Details Patient Name: Timothy Gibson Date of Service: 02/21/2018 9:00 AM Medical Record Number: [id number] Patient Account Number: [id number] Date of Birth/Sex: 1950/10/19 (67 y.o. M) Treating RN: Roger Shelter Primary Care Provider: PATIENT, NO Other Clinician: Referring Provider: Konrad Felix Treating Provider/Extender: Cathie Olden in Treatment: 12 Cellular or Tissue Based Wound #1 Right Toe Great Product Type Applied to: Performed By: Physician Lawanda Cousins, NP Cellular or Tissue Based Oasis wound matrix Product Type: Pre-procedure Verification/Time Yes - 09:15 Out Taken: Location: genitalia / hands / feet / multiple digits Wound Size (sq cm): 0.04 Product Size (sq cm): 11 Waste Size (sq cm): 10.96 Waste Reason: wound size Amount of Product Applied (sq cm): 0.04 Instrument Used: Forceps, Scissors Lot #: [id number] Expiration Date: 09/16/2019 Fenestrated: Yes Reconstituted: Yes Solution Type: normal saline Solution Amount: 75ml Lot #: [id number] Solution Expiration Date: 10/09/2019 Secured: Yes Secured With: Steri-Strips Dressing Applied: Yes Primary Dressing: gauze Procedural Pain:  0 Post Procedural Pain: 0 Response to Treatment: Procedure was tolerated well Level of Consciousness: Awake and Alert Post Procedure Diagnosis Same as Pre-procedure Electronic Signature(s) Signed: 02/21/2018 2:06:43 PM By: Roger Shelter Entered By: Roger Shelter on 02/21/2018 11:38:48 Timothy Gibson ([id number]) -------------------------------------------------------------------------------- Debridement Details Patient Name: Timothy Gibson Date of Service: 02/21/2018 9:00 AM Medical Record Number: [id number] Patient Account Number: [id number] Date of Birth/Sex: Nov 24, 1950 (67 y.o. M) Treating RN: Roger Shelter Primary Care Provider: PATIENT, NO Other Clinician: Referring Provider: Konrad Felix Treating Provider/Extender: Cathie Olden in Treatment: 12 Debridement Performed for Wound #1 Right Toe Great Assessment: Performed By: Physician Lawanda Cousins, NP Debridement Type: Debridement Severity of Tissue Pre Fat layer exposed Debridement: Pre-procedure Verification/Time Yes - 09:10 Out Taken: Start Time: 09:10 Pain Control: Other : lidocaine 4% Total Area Debrided (L x W): 0.2 (cm) x 0.2 (cm) = 0.04 (cm) Tissue and other material Viable, Subcutaneous, Skin: Dermis debrided: Level: Skin/Subcutaneous Tissue Debridement Description: Excisional Instrument: Curette Bleeding: Minimum Hemostasis Achieved: Pressure End Time: 09:12 Procedural Pain: 0 Post Procedural Pain: 0 Response to Treatment: Procedure was tolerated well Level of Consciousness: Awake and Alert Post Debridement Measurements of Total Wound Length: (cm) 0.2 Width: (cm) 0.2 Depth: (cm) 0.1 Volume: (cm) 0.003 Character of Wound/Ulcer Post Debridement: Stable Severity of Tissue Post Debridement: Fat layer exposed Post Procedure Diagnosis Same as Pre-procedure Electronic Signature(s) Signed: 02/21/2018 9:24:16 AM By: Lawanda Cousins Signed: 02/21/2018 2:06:43 PM By: Roger Shelter Entered By: Lawanda Cousins on 02/21/2018 09:24:16 Timothy Gibson ([id number]) -------------------------------------------------------------------------------- HPI Details Patient Name: Timothy Gibson Date of Service: 02/21/2018 9:00 AM Medical Record Number: [id number] Patient Account Number: [id number] Date of Birth/Sex: 08/03/1950 (67 y.o. M) Treating RN: Roger Shelter Primary Care Provider: PATIENT, NO Other Clinician: Referring Provider: Konrad Felix Treating Provider/Extender: Cathie Olden in Treatment: 12 History of Present Illness HPI Description: 11/27/17 on evaluation today patient presents for initial evaluation concerning an issue that he has been having with his right great  toe which began last Thursday 11/22/17. He has a history of diabetes mellitus type to which he has had for greater than 30 years currently he is on insulin. Subsequently he also has a history of hypertension. On physical exam inspection is also appears he may have some peripheral vascular disease with his ABI's being noncompressible bilaterally. He is on dialysis as well and this is on Monday, Wednesday, and Friday. Patient was hospitalized back in January/February 2019 although this was more for stomach/back pain though they never told me exactly what was going on. This is according to the patient. Subsequently the ulcer which is between the third and fourth toe when space of the right foot as well is on the plantar aspect of the fourth toe is due to him having cleaned his toes this morning and he states that his finger which is large split the toe tissue in between causing the wounds that we currently see. With that being said he states this has happened before I explained I would definitely recommend that he not do this any longer. He can use a small soft washcloth to clean between the toes without causing trauma. Subsequently the right great toe hatchery does show evidence of necrotic  tissue present on the surface of the wound specifically there is callous and dead skin surrounding which is trapping fluid causing problems as far as the wound is concerned. The surface of the wound does show slough although due to his vascular flow I'm not going to sharply debride this today I think I will selectively debride away the necrotic skin as well is callous from around the surrounding so this will not continue to be a moisture issue. Nonetheless the patient has no pain he does have diabetic neuropathy. 12/06/17-He is here in follow-up evaluation for a right great toe ulcer. He is voicing no complaints or concerns. He continues to infrequently/socially smokes cigars with no desire to quit. He has been compliant with offloading, using open toed surgical shoe; has been compliant using Santyl daily. He continues on clindamycin although takes it inconsistently secondary to indigestion. The plain film x-ray performed on 5/23 impression: Soft tissue swelling of the left first toe and is noted with probable irregular lucency involving distal tuft of first distal phalanx concerning for osteomyelitis. MRI may be performed for further evaluation; MRI ordered. The vascular evaluation performed on 5/24 field non-compressible ABIs bilaterally and reduced TBI bilaterally suggesting significant tibial disease. He will be referred to vascular medicine for further evaluation. 12/13/17-He is here in follow-up evaluation for right great toe ulcer. He has appointment next Thursday for the MRI and vascular evaluation. Wound culture obtained last week grew abundant Klebsiella oxytoca (multidrug sensitivity), and abundant enteric coccus faecalis (sensitive to ampicillin). Ampicillin and Cipro were called in, along with a probiotic. In light of his appointments next Thursday he will follow-up in 2 weeks, we will continue with Santyl 12/27/17-He is here for evaluation for a right great toe ulcer. MRI performed on 6/13  revealed osteomyelitis at the distal phalanx of the great toe, negative for abscess or septic joint. He did have evaluation by vascular medicine, Dr. Ronalee Belts, on 6/13, at that appointment it was decided for him to undergo angiography with possible intervention but he refused and is still considering. If he chooses to have it done he will contact the office. He has not heard back from either ID offices that he was referred to two weeks ago: [place] and [place]. there is improvement in both appearance  and measurement to the ulcer. We will extend antibiotic therapy (amoxicillin 500 every 12, Cipro 500 daily) for additional 2 weeks pending infectious disease consult, he will continue with Santyl daily. He was reminded to continue with probiotic therapy while on oral antibiotics; he denies any GI disturbance. 01/03/18-He is here in follow-up evaluation for right great toe ulcer. He is decided to not undergo any vascular intervention at this time. He was established with [facility] infectious disease (Dr. Linus Salmons) last week initiated on vancomycin and cefazolin with dialysis treatment for 6 weeks. We will continue with current treatment plan with Santyl and offloading and he will follow- up in 2 weeks 01/17/18-He is seen in follow-up evaluation for right great toe ulcer. There is improvement in appearance with less nonviable tissue present. We will continue with same treatment plan he will follow-up next week. He is tolerating IV antibiotics without any complications 3/71/69-CV is seen in follow up evaluation for right great toe ulcer. There continues to be improvement. He is tolerating IV antibiotics with hemodialysis, completion date of 7/31. We will transition to collagen and and follow-up next week 02/07/18-He is here in follow up for a right great toe ulcer. There is red granulation tissue throughout the wound, improved in appearance. He completed antibiotics yesterday. He saw podiatry last Thursday for  nail trimming, at that appointment he periwound callus was trimmed and a culture was obtained; culture negative. He does not have a follow up appointment with AMUN, STEMM. ([id number]) infectious disease. We will submit for grafix. 02/14/18-He is here in follow-up evaluation for a right great toe ulcer. There is slow improvement, red granulation tissue throughout. The insurance approval for grafix is pending. We will continue with collagen and he'll follow-up next week 02/21/18-He is seen in follow-up evaluation for right great toe ulcer. There is improvement with red granulation tissue throughout. He was approved for oasis and this was placed today. He will follow up next week Electronic Signature(s) Signed: 02/21/2018 9:25:06 AM By: Lawanda Cousins Entered By: Lawanda Cousins on 02/21/2018 09:25:06 SIDDHARTHA, HOBACK ([id number]) -------------------------------------------------------------------------------- Physician Orders Details Patient Name: Timothy Gibson Date of Service: 02/21/2018 9:00 AM Medical Record Number: [id number] Patient Account Number: [id number] Date of Birth/Sex: April 15, 1951 (67 y.o. M) Treating RN: Roger Shelter Primary Care Provider: PATIENT, NO Other Clinician: Referring Provider: Konrad Felix Treating Provider/Extender: Cathie Olden in Treatment: 12 Verbal / Phone Orders: No Diagnosis Coding Wound Cleansing Wound #1 Right Toe Great o Clean wound with Normal Saline. Anesthetic (add to Medication List) Wound #1 Right Toe Great o Topical Lidocaine 4% cream applied to wound bed prior to debridement (In Clinic Only). Secondary Dressing Wound #1 Right Toe Great o Dry Gauze o Conform/Kerlix Dressing Change Frequency Wound #1 Right Toe Great o Change dressing every week Follow-up Appointments Wound #1 Right Toe Great o Return Appointment in 1 week. Advanced Therapies Wound #1 Right Toe Great o Oasis application in clinic; including  contact layer, fixation with steri strips, dry gauze and cover dressing. Electronic Signature(s) Signed: 02/21/2018 2:06:43 PM By: Roger Shelter Signed: 02/21/2018 4:32:21 PM By: Lawanda Cousins Entered By: Roger Shelter on 02/21/2018 09:21:10 Timothy Gibson ([id number]) -------------------------------------------------------------------------------- Problem List Details Patient Name: Timothy Gibson Date of Service: 02/21/2018 9:00 AM Medical Record Number: [id number] Patient Account Number: [id number] Date of Birth/Sex: 08-Mar-1951 (67 y.o. 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/27/2017 No Yes layer exposed E11.621 Type 2 diabetes mellitus with foot ulcer 11/27/2017 No Yes I70.235 Atherosclerosis of native arteries of right leg with ulceration of 12/06/2017 No Yes other part of foot M86.671 Other chronic osteomyelitis, right ankle and foot 12/27/2017 No Yes I10 Essential (primary) hypertension 11/27/2017 No Yes N18.6 End stage renal disease 11/27/2017 No Yes Inactive Problems Resolved Problems ICD-10 Code Description Active Date Resolved Date E44.1 Mild protein-calorie malnutrition 11/27/2017 11/27/2017 Electronic Signature(s) Signed: 02/21/2018 9:23:51 AM By: Lawanda Cousins Entered By: Lawanda Cousins on 02/21/2018 09:23:50 Timothy Gibson ([id number]) -------------------------------------------------------------------------------- Progress Note Details Patient Name: Timothy Gibson Date of Service: 02/21/2018 9:00 AM Medical Record Number: [id number] Patient Account Number: [id number] Date of Birth/Sex: July 17, 1950 (67 y.o. M) Treating RN: Roger Shelter Primary Care  Provider: PATIENT, NO Other Clinician: Referring Provider: Konrad Felix Treating Provider/Extender: Cathie Olden in Treatment: 12 Subjective Chief Complaint Information obtained from Patient Right Great Toe History of Present Illness (HPI) 11/27/17 on evaluation today patient presents for initial evaluation concerning an issue that he has been having with his right great toe which began last Thursday 11/22/17. He has a history of diabetes mellitus type to which he has had for greater than 30 years currently he is on insulin. Subsequently he also has a history of hypertension. On physical exam inspection is also appears he may have some peripheral vascular disease with his ABI's being noncompressible bilaterally. He is on dialysis as well and this is on Monday, Wednesday, and Friday. Patient was hospitalized back in January/February 2019 although this was more for stomach/back pain though they never told me exactly what was going on. This is according to the patient. Subsequently the ulcer which is between the third and fourth toe when space of the right foot as well is on the plantar aspect of the fourth toe is due to him having cleaned his toes this morning and he states that his finger which is large split the toe tissue in between causing the wounds that we currently see. With that being said he states this has happened before I explained I would definitely recommend that he not do this any longer. He can use a small soft washcloth to clean between the toes without causing trauma. Subsequently the right great toe hatchery does show evidence of necrotic tissue present on the surface of the wound specifically there is callous and dead skin surrounding which is trapping fluid causing problems as far as the wound is concerned. The surface of the wound does show slough although due to his vascular flow I'm not going to sharply debride this today I think I will selectively debride away  the necrotic skin as well is callous from around the surrounding so this will not continue to be a moisture issue. Nonetheless the patient has no pain he does have diabetic neuropathy. 12/06/17-He is here in follow-up evaluation for a right great toe ulcer. He is voicing no complaints or concerns. He continues to infrequently/socially smokes cigars with no desire to quit. He has been compliant with offloading, using open toed surgical shoe; has been compliant using Santyl daily. He continues on clindamycin although takes it inconsistently secondary to indigestion. The plain film x-ray performed on 5/23 impression: Soft tissue swelling of the left first toe and is noted with probable irregular lucency involving distal tuft of first distal phalanx concerning for osteomyelitis. MRI may be  performed for further evaluation; MRI ordered. The vascular evaluation performed on 5/24 field non-compressible ABIs bilaterally and reduced TBI bilaterally suggesting significant tibial disease. He will be referred to vascular medicine for further evaluation. 12/13/17-He is here in follow-up evaluation for right great toe ulcer. He has appointment next Thursday for the MRI and vascular evaluation. Wound culture obtained last week grew abundant Klebsiella oxytoca (multidrug sensitivity), and abundant enteric coccus faecalis (sensitive to ampicillin). Ampicillin and Cipro were called in, along with a probiotic. In light of his appointments next Thursday he will follow-up in 2 weeks, we will continue with Santyl 12/27/17-He is here for evaluation for a right great toe ulcer. 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 He did have evaluation by vascular medicine, Dr. Ronalee Belts, on 6/13, at that appointment it was decided for him to undergo angiography with possible intervention but he refused and is still considering. If he chooses to have it done  he will contact the office. He has not heard back from either ID offices that he was referred to two weeks ago: [place] and [place]. there is improvement in both appearance and measurement to the ulcer. We will extend antibiotic therapy (amoxicillin 500 every 12, Cipro 500 daily) for additional 2 weeks pending infectious disease consult, he will continue with Santyl daily. He was reminded to continue with probiotic therapy while on oral antibiotics; he denies any GI disturbance. 01/03/18-He is here in follow-up evaluation for right great toe ulcer. He is decided to not undergo any vascular intervention at this time. He was established with [facility] infectious disease (Dr. Linus Salmons) last week initiated on vancomycin and cefazolin with dialysis treatment for 6 weeks. We will continue with current treatment plan with Santyl and offloading and he will follow- up in 2 weeks 01/17/18-He is seen in follow-up evaluation for right great toe ulcer. There is improvement in appearance with less nonviable tissue present. We will continue with same treatment plan he will follow-up next week. He is tolerating IV antibiotics without RHYTHM, WIGFALL. ([id number]) any complications 3/78/58-IF is seen in follow up evaluation for right great toe ulcer. There continues to be improvement. He is tolerating IV antibiotics with hemodialysis, completion date of 7/31. We will transition to collagen and and follow-up next week 02/07/18-He is here in follow up for a right great toe ulcer. There is red granulation tissue throughout the wound, improved in appearance. He completed antibiotics yesterday. He saw podiatry last Thursday for nail trimming, at that appointment he periwound callus was trimmed and a culture was obtained; culture negative. He does not have a follow up appointment with infectious disease. We will submit for grafix. 02/14/18-He is here in follow-up evaluation for a right great toe ulcer. There is slow  improvement, red granulation tissue throughout. The insurance approval for grafix is pending. We will continue with collagen and he'll follow-up next week 02/21/18-He is seen in follow-up evaluation for right great toe ulcer. There is improvement with red granulation tissue throughout. He was approved for oasis and this was placed today. He will follow up next week Objective Constitutional Vitals Time Taken: 8:55 AM, Height: 76 in, Weight: 220 lbs, BMI: 26.8, Temperature: 98.4 F, Pulse: 80 bpm, Respiratory Rate: 16 breaths/min, Blood Pressure: 197/87 mmHg. Integumentary (Hair, Skin) Wound #1 status is Open. Original cause of wound was Gradually Appeared. The wound is located on the Right Toe Great. The wound measures 0.2cm length x 0.2cm width  x 0.1cm depth; 0.031cm^2 area and 0.003cm^3 volume. There is Fat Layer (Subcutaneous Tissue) Exposed exposed. There is no tunneling or undermining noted. There is a none present amount of drainage noted. The wound margin is well defined and not attached to the wound base. There is no granulation within the wound bed. There is no necrotic tissue within the wound bed. The periwound skin appearance exhibited: Callus. The periwound skin appearance did not exhibit: Crepitus, Excoriation, Induration, Rash, Scarring, Dry/Scaly, Maceration, Atrophie Blanche, Cyanosis, Ecchymosis, Hemosiderin Staining, Mottled, Pallor, Rubor, Erythema. Periwound temperature was noted as No Abnormality. 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 There was a Excisional Skin/Subcutaneous Tissue Debridement with a total area of 0.04 sq cm performed by Lawanda Cousins, NP. With the following instrument(s): Curette to remove Viable tissue/material. Material removed includes Subcutaneous Tissue and Skin: Dermis and after achieving pain control using Other (lidocaine 4%). No specimens were taken. A time out was conducted at 09:10, prior to the start of the procedure. A Minimum amount of bleeding was controlled with Pressure. The procedure was tolerated well with a pain level of 0 throughout and a pain level of 0 following the procedure. Patient s Level of Consciousness post procedure was recorded as Awake and Alert. Post Debridement Measurements: 0.2cm length x 0.2cm width x 0.1cm depth; 0.003cm^3 volume. Character of Wound/Ulcer Post Debridement is stable. Severity of Tissue Post Debridement is: Fat layer exposed. Post procedure Diagnosis Wound #1: Same as Pre-Procedure Pre-procedure diagnosis of Wound #1 is a Diabetic Wound/Ulcer of the Lower Extremity located on the Right Toe Great. A skin graft procedure using a bioengineered skin substitute/cellular or tissue based product was performed by Lawanda Cousins, NP with the following instrument(s): Forceps and Scissors. Oasis wound matrix was applied and secured with Steri-Strips. 0.04 sq cm of product was utilized and 10.96 sq cm was wasted due to wound size. Post Application, gauze was applied. A Time Out was conducted at 09:15, prior to the start of the procedure. The procedure was tolerated well with a pain level of 0 throughout and a pain level of 0 following the procedure. Patient s Level of Consciousness post procedure was recorded as Awake and Alert. Post procedure Diagnosis Wound #1: Same as Pre-Procedure . Plan Wound Cleansing: Wound #1 Right Toe Great: Clean wound with Normal  Saline. Anesthetic (add to Medication List): Wound #1 Right Toe Great: Topical Lidocaine 4% cream applied to wound bed prior to debridement (In Clinic Only). Secondary Dressing: Wound #1 Right Toe Great: Dry Gauze Conform/Kerlix Dressing Change Frequency: Wound #1 Right Toe Great: Change dressing every week Follow-up Appointments: Wound #1 Right Toe Great: Return Appointment in 1 week. Advanced Therapies: Wound #1 Right Toe Great: Oasis application in clinic; including contact layer, fixation with steri strips, dry gauze and cover dressing. Electronic Signature(s) Signed: 02/21/2018 4:33:00 PM By: Lawanda Cousins Previous Signature: 02/21/2018 9:25:17 AM Version By: Lawanda Cousins Entered By: Lawanda Cousins on 02/21/2018 16:32:59 MONTRELL, CESSNA ([id number], WEED ([id number]) -------------------------------------------------------------------------------- [place] Details Patient Name: JAION, LAGRANGE.  Date of Service: 02/21/2018 Medical Record Number: [id number] Patient Account Number: [id number] Date of Birth/Sex: 1950/10/17 (67 y.o. M) Treating RN: Roger Shelter Primary Care Provider: PATIENT, NO Other Clinician: Referring Provider: Konrad Felix Treating Provider/Extender: Cathie Olden in Treatment: 12 Diagnosis Coding ICD-10 Codes Code Description [phone] Non-pressure chronic ulcer of other part of right foot with fat layer exposed E11.621 Type 2 diabetes mellitus with foot ulcer I70.235 Atherosclerosis of native arteries of right leg with ulceration of other part of foot M86.671 Other chronic osteomyelitis, right ankle and foot I10 Essential (primary) hypertension N18.6 End stage renal disease Facility Procedures CPT4: Description Modifier Quantity Code [id number] (Facility Use Only) C5275 - SK SUB 1ST 25SQCM SM AREA H/F/T/F/S 1 ICD-10 Diagnosis Description L97.512 Non-pressure chronic ulcer of other part of right foot with fat layer exposed CPT4:  [id number] Q4102- Dermal substitute tissue/non-human origin w metabolically active elements- 11 Oasis (Wound Matrix)product applied per sq cm Physician Procedures CPT4: Description Modifier Quantity Code [phone] (Facility Use Only) Application of skin sugraft to face, scalp, mouth,neck, ears, genitalia, 1 hands, feet, and/or multiple digits, tws area up to 100 sq cm; 1st 25 sq cm or< wound surface area ICD-10  Diagnosis Description L97.512 Non-pressure chronic ulcer of other part of right foot with fat layer exposed Electronic Signature(s) Signed: 02/21/2018 4:24:20 PM By: Gretta Cool, BSN, RN, CWS, Kim RN, BSN Signed: 02/21/2018 4:32:21 PM By: Lawanda Cousins Previous Signature: 02/21/2018 4:23:27 PM Version By: Gretta Cool, BSN, RN, CWS, Kim RN, BSN Previous Signature: 02/21/2018 9:25:27 AM Version By: Lawanda Cousins Entered By: Gretta Cool, BSN, RN, CWS, Kim on 02/21/2018 16:24:20

## 2018-03-07 NOTE — Progress Notes (Signed)
JAMEN, LOISEAU ([id number]) Visit Report for 02/21/2018 Arrival Information Details Patient Name: Timothy Gibson, Timothy Gibson Date of Service: 02/21/2018 9:00 AM Medical Record Number: [id number] Patient Account Number: [id number] Date of Birth/Sex: Jan 24, 1951 (67 y.o. M) Treating RN: Secundino Ginger Primary Care Britten Parady: PATIENT, NO Other Clinician: Referring Nickalus Thornsberry: Konrad Felix Treating Jacinda Kanady/Extender: Cathie Olden in Treatment: 12 Visit Information History Since Last Visit Added or deleted any medications: No Patient Arrived: Ambulatory Any new allergies or adverse reactions: No Arrival Time: 08:52 Had a fall or experienced change in No Accompanied By: family activities of daily living that may affect Transfer Assistance: None risk of falls: Patient Identification Verified: Yes Signs or symptoms of abuse/neglect since last visito No Secondary Verification Process Completed: Yes Hospitalized since last visit: No Patient Requires Transmission-Based No Implantable device outside of the clinic excluding No Precautions: cellular tissue based products placed in the center Patient Has Alerts: No since last visit: Has Dressing in Place as Prescribed: Yes Pain Present Now: No Electronic Signature(s) Signed: 02/21/2018 3:17:24 PM By: Secundino Ginger Entered By: Secundino Ginger on 02/21/2018 08:57:13 Timothy Gibson ([id number]) -------------------------------------------------------------------------------- Encounter Discharge Information Details Patient Name: Timothy Gibson Date of Service: 02/21/2018 9:00 AM Medical Record Number: [id number] Patient Account Number: [id number] Date of Birth/Sex: 09/19/1950 (67 y.o. M) Treating RN: Cornell Barman Primary Care Karyl Sharrar: PATIENT, NO Other Clinician: Referring Annye Forrey: Konrad Felix Treating Taher Vannote/Extender: Cathie Olden in Treatment: 12 Encounter Discharge Information Items Discharge Condition: Stable Ambulatory Status:  Ambulatory Discharge Destination: Home Transportation: Private Auto Accompanied By: granddaughter Schedule Follow-up Appointment: Yes Clinical Summary of Care: Electronic Signature(s) Signed: 02/21/2018 11:00:31 AM By: Gretta Cool, BSN, RN, CWS, Kim RN, BSN Entered By: Gretta Cool, BSN, RN, CWS, Kim on 02/21/2018 11:00:30 Timothy Gibson ([id number]) -------------------------------------------------------------------------------- Lower Extremity Assessment Details Patient Name: Timothy Gibson Date of Service: 02/21/2018 9:00 AM Medical Record Number: [id number] Patient Account Number: [id number] Date of Birth/Sex: 10/04/1950 (67 y.o. M) Treating RN: Secundino Ginger Primary Care Zuri Bradway: PATIENT, NO Other Clinician: Referring Maurie Musco: Konrad Felix Treating Shaliyah Taite/Extender: Cathie Olden in Treatment: 12 Electronic Signature(s) Signed: 02/21/2018 3:17:24 PM By: Secundino Ginger Entered By: Secundino Ginger on 02/21/2018 09:05:30 Timothy Gibson ([id number]) -------------------------------------------------------------------------------- Multi Wound Chart Details Patient Name: Timothy Gibson Date of Service: 02/21/2018 9:00 AM Medical Record Number: [id number] Patient Account Number: [id number] Date of Birth/Sex: [birth year] (67 y.o. M) Treating RN: Roger Shelter Primary Care Magie Ciampa: PATIENT, NO Other Clinician: Referring Julann Mcgilvray: Konrad Felix Treating Kacin Dancy/Extender: Cathie Olden in Treatment: 12 Vital Signs Height(in): 76 Pulse(bpm): 80 Weight(lbs): 220 Blood Pressure(mmHg): 197/87 Body Mass Index(BMI): 27 Temperature(F): 98.4 Respiratory Rate 16 (breaths/min): Photos: [1:No Photos] [N/A:N/A] Wound Location: [1:Right Toe Great] [N/A:N/A] Wounding Event: [1:Gradually Appeared] [N/A:N/A] Primary Etiology: [1:Diabetic Wound/Ulcer of the Lower Extremity] [N/A:N/A] Comorbid History: [1:Lymphedema, Arrhythmia, Hypertension, Type II Diabetes, End Stage Renal Disease,  Rheumatoid Arthritis, Osteoarthritis, Confinement Anxiety] [N/A:N/A] Date Acquired: [1:11/22/2017] [N/A:N/A] Weeks of Treatment: [1:12] [N/A:N/A] Wound Status: [1:Open] [N/A:N/A] Measurements L x W x D [1:0.2x0.2x0.1] [N/A:N/A] (cm) Area (cm) : [1:0.031] [N/A:N/A] Volume (cm) : [1:0.003] [N/A:N/A] % Reduction in Area: [1:98.70%] [N/A:N/A] % Reduction in Volume: [1:98.70%] [N/A:N/A] Classification: [1:Grade 2] [N/A:N/A] Exudate Amount: [1:None Present] [N/A:N/A] Wound Margin: [1:Well defined, not attached] [N/A:N/A] Granulation Amount: [1:None Present (0%)] [N/A:N/A] Necrotic Amount: [1:None Present (0%)] [N/A:N/A] Exposed Structures: [1:Fat Layer (Subcutaneous Tissue) Exposed: Yes Fascia: No Tendon: No Muscle: No Joint: No Bone: No] [N/A:N/A] Epithelialization: [1:Medium (34-66%)] [N/A:N/A] Debridement: [1:Debridement - Excisional] [N/A:N/A] Pre-procedure [1:09:10] [N/A:N/A] Verification/Time Out Taken: Pain  Control: [1:Other] [N/A:N/A] Tissue Debrided: Subcutaneous, Slough N/A N/A Level: Skin/Subcutaneous Tissue N/A N/A Debridement Area (sq cm): 0.04 N/A N/A Instrument: Curette N/A N/A Bleeding: Minimum N/A N/A Hemostasis Achieved: Pressure N/A N/A Procedural Pain: 0 N/A N/A Post Procedural Pain: 0 N/A N/A Debridement Treatment Procedure was tolerated well N/A N/A Response: Post Debridement 0.2x0.2x0.1 N/A N/A Measurements L x W x D (cm) Post Debridement Volume: 0.003 N/A N/A (cm) Periwound Skin Texture: Callus: Yes N/A N/A Excoriation: No Induration: No Crepitus: No Rash: No Scarring: No Periwound Skin Moisture: Maceration: No N/A N/A Dry/Scaly: No Periwound Skin Color: Atrophie Blanche: No N/A N/A Cyanosis: No Ecchymosis: No Erythema: No Hemosiderin Staining: No Mottled: No Pallor: No Rubor: No Temperature: No Abnormality N/A N/A Tenderness on Palpation: No N/A N/A Wound Preparation: Ulcer Cleansing: N/A N/A Rinsed/Irrigated with Saline Topical  Anesthetic Applied: Other: lidocaine 4% Procedures Performed: Cellular or Tissue Based N/A N/A Product Debridement Treatment Notes Electronic Signature(s) Signed: 02/21/2018 9:23:57 AM By: Lawanda Cousins Entered By: Lawanda Cousins on 02/21/2018 09:23:57 MALACHY, COLEMAN ([id number]) -------------------------------------------------------------------------------- [place] Details Patient Name: Timothy Gibson Date of Service: 02/21/2018 9:00 AM Medical Record Number: [id number] Patient Account Number: [id number] Date of Birth/Sex: 17-Nov-1950 (67 y.o. M) Treating RN: Roger Shelter Primary Care Carolann Brazell: PATIENT, NO Other Clinician: Referring Teyanna Thielman: Konrad Felix Treating Nichalas Coin/Extender: Cathie Olden in Treatment: 12 Active Inactive ` Abuse / Safety / Falls / Self Care Management Nursing Diagnoses: Potential for falls Goals: Patient will remain injury free related to falls Date Initiated: 11/27/2017 Target Resolution Date: 12/14/2017 Goal Status: Active Interventions: Assess fall risk on admission and as needed Notes: ` Nutrition Nursing Diagnoses: Impaired glucose control: actual or potential Goals: Patient/caregiver verbalizes understanding of need to maintain therapeutic glucose control per primary care physician Date Initiated: 11/27/2017 Target Resolution Date: 12/28/2017 Goal Status: Active Interventions: Provide education on elevated blood sugars and impact on wound healing Notes: ` Orientation to the Wound Care Program Nursing Diagnoses: Knowledge deficit related to the wound healing center program Goals: Patient/caregiver will verbalize understanding of the [facility] Date Initiated: 11/27/2017 Target Resolution Date: 12/28/2017 Goal Status: Active Interventions: KOVEN, BELINSKY ([id number]) Provide education on orientation to the wound center Notes: ` Soft Tissue Infection Nursing  Diagnoses: Potential for infection: soft tissue Goals: Patient will remain free of wound infection Date Initiated: 11/27/2017 Target Resolution Date: 12/28/2017 Goal Status: Active Interventions: Assess signs and symptoms of infection every visit Treatment Activities: Systemic antibiotics : 11/27/2017 Notes: ` Wound/Skin Impairment Nursing Diagnoses: Impaired tissue integrity Goals: Ulcer/skin breakdown will have a volume reduction of 80% by week 12 Date Initiated: 11/27/2017 Target Resolution Date: 02/27/2018 Goal Status: Active Interventions: Assess patient/caregiver ability to perform ulcer/skin care regimen upon admission and as needed Treatment Activities: Referred to DME Davian Wollenberg for dressing supplies : 11/27/2017 Skin care regimen initiated : 11/27/2017 Notes: Electronic Signature(s) Signed: 02/21/2018 2:06:43 PM By: Roger Shelter Entered By: Roger Shelter on 02/21/2018 09:10:56 Timothy Gibson ([id number]) -------------------------------------------------------------------------------- Pain Assessment Details Patient Name: Timothy Gibson Date of Service: 02/21/2018 9:00 AM Medical Record Number: [id number] Patient Account Number: [id number] Date of Birth/Sex: 1950-10-05 (67 y.o. M) Treating RN: Secundino Ginger Primary Care Earon Rivest: PATIENT, NO Other Clinician: Referring Annabella Elford: Konrad Felix Treating Dashauna Heymann/Extender: Cathie Olden in Treatment: 12 Active Problems Location of Pain Severity and Description of Pain Patient Has Paino No Site Locations Pain Management and Medication Current Pain Management: Goals for Pain Management pt. states no pain. Electronic Signature(s) Signed: 02/21/2018 3:17:24  PM By: Secundino Ginger Entered By: Secundino Ginger on 02/21/2018 08:58:41 Timothy Gibson ([id number]) -------------------------------------------------------------------------------- Patient/Caregiver Education Details Patient Name: Timothy Gibson Date of  Service: 02/21/2018 9:00 AM Medical Record Number: [id number] Patient Account Number: [id number] Date of Birth/Gender: [birth year] (67 y.o. M) Treating RN: Cornell Barman Primary Care Physician: PATIENT, NO Other Clinician: Referring Physician: Konrad Felix Treating Physician/Extender: Cathie Olden in Treatment: 12 Education Assessment Education Provided To: Patient Education Topics Provided Wound/Skin Impairment: Handouts: Caring for Your Ulcer, Other: keep dressing dry and in place all week Methods: Demonstration, Explain/Verbal Responses: State content correctly Electronic Signature(s) Signed: 02/21/2018 4:35:38 PM By: Gretta Cool, BSN, RN, CWS, Kim RN, BSN Entered By: Gretta Cool, BSN, RN, CWS, Kim on 02/21/2018 11:00:59 Timothy Gibson ([id number]) -------------------------------------------------------------------------------- Wound Assessment Details Patient Name: Timothy Gibson Date of Service: 02/21/2018 9:00 AM Medical Record Number: [id number] Patient Account Number: [id number] Date of Birth/Sex: October 04, 1950 (67 y.o. M) Treating RN: Secundino Ginger Primary Care Alix Stowers: PATIENT, NO Other Clinician: Referring Akeema Broder: Konrad Felix Treating Mayling Aber/Extender: Cathie Olden in Treatment: 12 Wound Status Wound Number: 1 Primary Diabetic Wound/Ulcer of the Lower Extremity Etiology: Wound Location: Right Toe Great Wound Open Wounding Event: Gradually Appeared Status: Date Acquired: 11/22/2017 Comorbid Lymphedema, Arrhythmia, Hypertension, Type II Weeks Of Treatment: 12 History: Diabetes, End Stage Renal Disease, Clustered Wound: No Rheumatoid Arthritis, Osteoarthritis, Confinement Anxiety Wound Measurements Length: (cm) 0.2 Width: (cm) 0.2 Depth: (cm) 0.1 Area: (cm) 0.031 Volume: (cm) 0.003 % Reduction in Area: 98.7% % Reduction in Volume: 98.7% Epithelialization: Medium (34-66%) Tunneling: No Undermining: No Wound Description Classification: Grade  2 Wound Margin: Well defined, not attached Exudate Amount: None Present Foul Odor After Cleansing: No Slough/Fibrino Yes Wound Bed Granulation Amount: None Present (0%) Exposed Structure Necrotic Amount: None Present (0%) Fascia Exposed: No Fat Layer (Subcutaneous Tissue) Exposed: Yes Tendon Exposed: No Muscle Exposed: No Joint Exposed: No Bone Exposed: No Periwound Skin Texture Texture Color No Abnormalities Noted: No No Abnormalities Noted: No Callus: Yes Atrophie Blanche: No Crepitus: No Cyanosis: No Excoriation: No Ecchymosis: No Induration: No Erythema: No Rash: No Hemosiderin Staining: No Scarring: No Mottled: No Pallor: No Moisture Rubor: No No Abnormalities Noted: No Dry / Scaly: No Temperature / Pain Maceration: No Temperature: No Abnormality Wound Preparation AERO, DRUMMONDS ([id number]) Ulcer Cleansing: Rinsed/Irrigated with Saline Topical Anesthetic Applied: Other: lidocaine 4%, Treatment Notes Wound #1 (Right Toe Great) 1. Cleansed with: Clean wound with Normal Saline 2. Anesthetic Topical Lidocaine 4% cream to wound bed prior to debridement 4. Dressing Applied: Dry Gauze 5. Secondary Dressing Applied Kerlix/Conform 7. Secured with Recruitment consultant) Signed: 02/21/2018 3:17:24 PM By: Secundino Ginger Entered By: Secundino Ginger on 02/21/2018 09:05:22 Timothy Gibson ([id number]) -------------------------------------------------------------------------------- [place] Details Patient Name: Timothy Gibson Date of Service: 02/21/2018 9:00 AM Medical Record Number: [id number] Patient Account Number: [id number] Date of Birth/Sex: Nov 16, 1950 (67 y.o. M) Treating RN: Secundino Ginger Primary Care Mellony Danziger: PATIENT, NO Other Clinician: Referring Christia Coaxum: Konrad Felix Treating Elaura Calix/Extender: Cathie Olden in Treatment: 12 Vital Signs Time Taken: 08:55 Temperature (F): 98.4 Height (in): 76 Pulse (bpm): 80 Weight (lbs):  220 Respiratory Rate (breaths/min): 16 Body Mass Index (BMI): 26.8 Blood Pressure (mmHg): 197/87 Reference Range: 80 - 120 mg / dl Electronic Signature(s) Signed: 02/21/2018 3:17:24 PM By: Secundino Ginger Entered By: Secundino Ginger on 02/21/2018 09:00:09

## 2018-03-08 DIAGNOSIS — D631 Anemia in chronic kidney disease: Secondary | ICD-10-CM | POA: Diagnosis not present

## 2018-03-08 DIAGNOSIS — D509 Iron deficiency anemia, unspecified: Secondary | ICD-10-CM | POA: Diagnosis not present

## 2018-03-08 DIAGNOSIS — Z992 Dependence on renal dialysis: Secondary | ICD-10-CM | POA: Diagnosis not present

## 2018-03-08 DIAGNOSIS — N2581 Secondary hyperparathyroidism of renal origin: Secondary | ICD-10-CM | POA: Diagnosis not present

## 2018-03-08 DIAGNOSIS — N186 End stage renal disease: Secondary | ICD-10-CM | POA: Diagnosis not present

## 2018-03-09 DIAGNOSIS — N186 End stage renal disease: Secondary | ICD-10-CM | POA: Diagnosis not present

## 2018-03-09 DIAGNOSIS — Z992 Dependence on renal dialysis: Secondary | ICD-10-CM | POA: Diagnosis not present

## 2018-03-09 NOTE — Progress Notes (Signed)
[name], [name] ([id number]) Visit Report for 02/28/2018 Arrival Information Details Patient Name: Timothy Gibson, Timothy Gibson Date of Service: 02/28/2018 8:30 AM Medical Record Number: [id number] Patient Account Number: [id number] Date of Birth/Sex: 1950-09-13 (67 y.o. M) Treating RN: Roger Shelter Primary Care Keoki Mchargue: PATIENT, NO Other Clinician: Referring Iyari Hagner: Konrad Felix Treating Shawan Corella/Extender: Cathie Olden in Treatment: 13 Visit Information History Since Last Visit All ordered tests and consults were completed: No Patient Arrived: Ambulatory Added or deleted any medications: No Arrival Time: 08:35 Any new allergies or adverse reactions: No Accompanied By: caregiver Had a fall or experienced change in No Transfer Assistance: None activities of daily living that may affect Patient Identification Verified: Yes risk of falls: Secondary Verification Process Completed: Yes Signs or symptoms of abuse/neglect since last visito No Patient Requires Transmission-Based No Hospitalized since last visit: No Precautions: Implantable device outside of the clinic excluding No Patient Has Alerts: No cellular tissue based products placed in the center since last visit: Pain Present Now: No Electronic Signature(s) Signed: 02/28/2018 4:42:32 PM By: Roger Shelter Entered By: Roger Shelter on 02/28/2018 08:36:06 Timothy Gibson ([id number]) -------------------------------------------------------------------------------- Encounter Discharge Information Details Patient Name: Timothy Gibson Date of Service: 02/28/2018 8:30 AM Medical Record Number: [id number] Patient Account Number: [id number] Date of Birth/Sex: 1950-11-10 (67 y.o. M) Treating RN: Roger Shelter Primary Care Lucky Alverson: PATIENT, NO Other Clinician: Referring Che Rachal: Konrad Felix Treating Berneita Sanagustin/Extender: Cathie Olden in Treatment: 13 Encounter Discharge Information Items Discharge  Condition: Stable Ambulatory Status: Ambulatory Discharge Destination: Home Transportation: Private Auto Schedule Follow-up Appointment: Yes Clinical Summary of Care: Electronic Signature(s) Signed: 02/28/2018 4:42:32 PM By: Roger Shelter Entered By: Roger Shelter on 02/28/2018 09:08:15 Timothy Gibson ([id number]) -------------------------------------------------------------------------------- Lower Extremity Assessment Details Patient Name: Timothy Gibson Date of Service: 02/28/2018 8:30 AM Medical Record Number: [id number] Patient Account Number: [id number] Date of Birth/Sex: [birth year] (67 y.o. M) Treating RN: Roger Shelter Primary Care Caira Poche: PATIENT, NO Other Clinician: Referring Xavian Hardcastle: Konrad Felix Treating Dasan Hardman/Extender: Cathie Olden in Treatment: 13 Edema Assessment Assessed: [Left: No] [Right: No] Edema: [Left: N] [Right: o] Vascular Assessment Claudication: Claudication Assessment [Right:None] Pulses: Posterior Tibial Extremity colors, hair growth, and conditions: Extremity Color: [Right:Normal] Hair Growth on Extremity: [Right:Yes] Temperature of Extremity: [Right:Warm] Capillary Refill: [Right:< 3 seconds] Toe Nail Assessment Left: Right: Thick: No Discolored: No Deformed: No Improper Length and Hygiene: No Electronic Signature(s) Signed: 02/28/2018 4:42:32 PM By: Roger Shelter Entered By: Roger Shelter on 02/28/2018 08:43:47 Timothy Gibson ([id number]) -------------------------------------------------------------------------------- Multi Wound Chart Details Patient Name: Timothy Gibson Date of Service: 02/28/2018 8:30 AM Medical Record Number: [id number] Patient Account Number: [id number] Date of Birth/Sex: 09-23-1950 (67 y.o. M) Treating RN: Roger Shelter Primary Care Arial Galligan: PATIENT, NO Other Clinician: Referring Hindy Perrault: Konrad Felix Treating Kaleena Corrow/Extender: Cathie Olden in Treatment:  13 Vital Signs Height(in): 76 Pulse(bpm): 8 Weight(lbs): 220 Blood Pressure(mmHg): 191/91 Body Mass Index(BMI): 27 Temperature(F): 98.0 Respiratory Rate 18 (breaths/min): Photos: [1:No Photos] [N/A:N/A] Wound Location: [1:Right Toe Great] [N/A:N/A] Wounding Event: [1:Gradually Appeared] [N/A:N/A] Primary Etiology: [1:Diabetic Wound/Ulcer of the Lower Extremity] [N/A:N/A] Comorbid History: [1:Lymphedema, Arrhythmia, Hypertension, Type II Diabetes, End Stage Renal Disease, Rheumatoid Arthritis, Osteoarthritis, Confinement Anxiety] [N/A:N/A] Date Acquired: [1:11/22/2017] [N/A:N/A] Weeks of Treatment: [1:13] [N/A:N/A] Wound Status: [1:Open] [N/A:N/A] Measurements L x W x D [1:0.2x0.2x0.1] [N/A:N/A] (cm) Area (cm) : [1:0.031] [N/A:N/A] Volume (cm) : [1:0.003] [N/A:N/A] % Reduction in Area: [1:98.70%] [N/A:N/A] % Reduction in Volume: [1:98.70%] [N/A:N/A] Classification: [1:Grade 2] [N/A:N/A] Exudate Amount: [1:Medium] [N/A:N/A] Exudate Type: [1:Serous] [N/A:N/A] Exudate  Color: [1:amber] [N/A:N/A] Wound Margin: [1:Well defined, not attached] [N/A:N/A] Granulation Amount: [1:Large (67-100%)] [N/A:N/A] Granulation Quality: [1:Red, Pink] [N/A:N/A] Necrotic Amount: [1:Small (1-33%)] [N/A:N/A] Necrotic Tissue: [1:Eschar, Adherent Slough] [N/A:N/A] Exposed Structures: [1:Fat Layer (Subcutaneous Tissue) Exposed: Yes Fascia: No Tendon: No Muscle: No Joint: No Bone: No] [N/A:N/A] Epithelialization: [1:Medium (34-66%)] [N/A:N/A] Debridement: Debridement - Excisional N/A N/A Pre-procedure 08:55 N/A N/A Verification/Time Out Taken: Pain Control: Other N/A N/A Tissue Debrided: Subcutaneous, Slough N/A N/A Level: Skin/Subcutaneous Tissue N/A N/A Debridement Area (sq cm): 0.04 N/A N/A Instrument: Curette N/A N/A Bleeding: Minimum N/A N/A Hemostasis Achieved: Pressure N/A N/A Procedural Pain: 0 N/A N/A Post Procedural Pain: 0 N/A N/A Debridement Treatment Procedure was tolerated well  N/A N/A Response: Post Debridement 0.2x0.2x0.1 N/A N/A Measurements L x W x D (cm) Post Debridement Volume: 0.003 N/A N/A (cm) Periwound Skin Texture: Callus: Yes N/A N/A Excoriation: No Induration: No Crepitus: No Rash: No Scarring: No Periwound Skin Moisture: Maceration: No N/A N/A Dry/Scaly: No Periwound Skin Color: Atrophie Blanche: No N/A N/A Cyanosis: No Ecchymosis: No Erythema: No Hemosiderin Staining: No Mottled: No Pallor: No Rubor: No Temperature: No Abnormality N/A N/A Tenderness on Palpation: No N/A N/A Wound Preparation: Ulcer Cleansing: N/A N/A Rinsed/Irrigated with Saline Topical Anesthetic Applied: Other: lidocaine 4% Procedures Performed: Cellular or Tissue Based N/A N/A Product Debridement Treatment Notes Wound #1 (Right Toe Great) 1. Cleansed with: Clean wound with Normal Saline 2. Anesthetic Topical Lidocaine 4% cream to wound bed prior to debridement 4. Dressing Applied: Dry Gauze Notes oasis, mepitel and steri strips, conform Timothy Gibson, Timothy Gibson ([id number]) Electronic Signature(s) Signed: 02/28/2018 9:10:54 AM By: Lawanda Cousins Entered By: Lawanda Cousins on 02/28/2018 09:10:54 Timothy Gibson, Timothy Gibson ([id number]) -------------------------------------------------------------------------------- [place] Details Patient Name: Timothy Gibson Date of Service: 02/28/2018 8:30 AM Medical Record Number: [id number] Patient Account Number: [id number] Date of Birth/Sex: 04/18/1951 (67 y.o. M) Treating RN: Roger Shelter Primary Care Drena Ham: PATIENT, NO Other Clinician: Referring Kaedance Magos: Konrad Felix Treating Averiana Clouatre/Extender: Cathie Olden in Treatment: 13 Active Inactive ` Abuse / Safety / Falls / Self Care Management Nursing Diagnoses: Potential for falls Goals: Patient will remain injury free related to falls Date Initiated: 11/27/2017 Target Resolution Date: 12/14/2017 Goal Status:  Active Interventions: Assess fall risk on admission and as needed Notes: ` Nutrition Nursing Diagnoses: Impaired glucose control: actual or potential Goals: Patient/caregiver verbalizes understanding of need to maintain therapeutic glucose control per primary care physician Date Initiated: 11/27/2017 Target Resolution Date: 12/28/2017 Goal Status: Active Interventions: Provide education on elevated blood sugars and impact on wound healing Notes: ` Orientation to the Wound Care Program Nursing Diagnoses: Knowledge deficit related to the wound healing center program Goals: Patient/caregiver will verbalize understanding of the [facility] Date Initiated: 11/27/2017 Target Resolution Date: 12/28/2017 Goal Status: Active Interventions: Timothy Gibson, Timothy Gibson ([id number]) Provide education on orientation to the wound center Notes: ` Soft Tissue Infection Nursing Diagnoses: Potential for infection: soft tissue Goals: Patient will remain free of wound infection Date Initiated: 11/27/2017 Target Resolution Date: 12/28/2017 Goal Status: Active Interventions: Assess signs and symptoms of infection every visit Treatment Activities: Systemic antibiotics : 11/27/2017 Notes: ` Wound/Skin Impairment Nursing Diagnoses: Impaired tissue integrity Goals: Ulcer/skin breakdown will have a volume reduction of 80% by week 12 Date Initiated: 11/27/2017 Target Resolution Date: 02/27/2018 Goal Status: Active Interventions: Assess patient/caregiver ability to perform ulcer/skin care regimen upon admission and as needed Treatment Activities: Referred to DME Prisha Hiley for dressing supplies : 11/27/2017 Skin care regimen initiated : 11/27/2017  Notes: Electronic Signature(s) Signed: 02/28/2018 4:42:32 PM By: Roger Shelter Entered By: Roger Shelter on 02/28/2018 08:46:51 Timothy Gibson, Timothy Gibson  ([id number]) -------------------------------------------------------------------------------- Pain Assessment Details Patient Name: Timothy Gibson Date of Service: 02/28/2018 8:30 AM Medical Record Number: [id number] Patient Account Number: [id number] Date of Birth/Sex: May 25, 1951 (67 y.o. M) Treating RN: Roger Shelter Primary Care Nupur Hohman: PATIENT, NO Other Clinician: Referring Aloysuis Ribaudo: Konrad Felix Treating Anibal Quinby/Extender: Cathie Olden in Treatment: 13 Active Problems Location of Pain Severity and Description of Pain Patient Has Paino No Site Locations Pain Management and Medication Current Pain Management: Electronic Signature(s) Signed: 02/28/2018 4:42:32 PM By: Roger Shelter Entered By: Roger Shelter on 02/28/2018 08:36:12 Timothy Gibson ([id number]) -------------------------------------------------------------------------------- Patient/Caregiver Education Details Patient Name: Timothy Gibson Date of Service: 02/28/2018 8:30 AM Medical Record Number: [id number] Patient Account Number: [id number] Date of Birth/Gender: 12/14/1950 (67 y.o. M) Treating RN: Roger Shelter Primary Care Physician: PATIENT, NO Other Clinician: Referring Physician: Konrad Felix Treating Physician/Extender: Cathie Olden in Treatment: 13 Education Assessment Education Provided To: Patient Education Topics Provided Wound Debridement: Handouts: Wound Debridement Methods: Explain/Verbal Responses: State content correctly Wound/Skin Impairment: Handouts: Caring for Your Ulcer Methods: Explain/Verbal Responses: State content correctly Electronic Signature(s) Signed: 02/28/2018 4:42:32 PM By: Roger Shelter Entered By: Roger Shelter on 02/28/2018 09:08:29 Timothy Gibson ([id number]) -------------------------------------------------------------------------------- Wound Assessment Details Patient Name: Timothy Gibson Date of Service: 02/28/2018  8:30 AM Medical Record Number: [id number] Patient Account Number: [id number] Date of Birth/Sex: [birth year] (67 y.o. M) Treating RN: Roger Shelter Primary Care Retia Cordle: PATIENT, NO Other Clinician: Referring Wayden Schwertner: Konrad Felix Treating Joushua Dugar/Extender: Cathie Olden in Treatment: 13 Wound Status Wound Number: 1 Primary Diabetic Wound/Ulcer of the Lower Extremity Etiology: Wound Location: Right Toe Great Wound Open Wounding Event: Gradually Appeared Status: Date Acquired: 11/22/2017 Comorbid Lymphedema, Arrhythmia, Hypertension, Type II Weeks Of Treatment: 13 History: Diabetes, End Stage Renal Disease, Clustered Wound: No Rheumatoid Arthritis, Osteoarthritis, Confinement Anxiety Photos Photo Uploaded By: Roger Shelter on 02/28/2018 11:41:55 Wound Measurements Length: (cm) 0.2 Width: (cm) 0.2 Depth: (cm) 0.1 Area: (cm) 0.031 Volume: (cm) 0.003 % Reduction in Area: 98.7% % Reduction in Volume: 98.7% Epithelialization: Medium (34-66%) Tunneling: No Undermining: No Wound Description Classification: Grade 2 Wound Margin: Well defined, not attached Exudate Amount: Medium Exudate Type: Serous Exudate Color: amber Foul Odor After Cleansing: No Slough/Fibrino Yes Wound Bed Granulation Amount: Large (67-100%) Exposed Structure Granulation Quality: Red, Pink Fascia Exposed: No Necrotic Amount: Small (1-33%) Fat Layer (Subcutaneous Tissue) Exposed: Yes Necrotic Quality: Eschar, Adherent Slough Tendon Exposed: No Muscle Exposed: No Joint Exposed: No Bone Exposed: No Timothy Gibson, Timothy Gibson. ([id number]) Periwound Skin Texture Texture Color No Abnormalities Noted: No No Abnormalities Noted: No Callus: Yes Atrophie Blanche: No Crepitus: No Cyanosis: No Excoriation: No Ecchymosis: No Induration: No Erythema: No Rash: No Hemosiderin Staining: No Scarring: No Mottled: No Pallor: No Moisture Rubor: No No Abnormalities Noted: No Dry / Scaly: No  Temperature / Pain Maceration: No Temperature: No Abnormality Wound Preparation Ulcer Cleansing: Rinsed/Irrigated with Saline Topical Anesthetic Applied: Other: lidocaine 4%, Treatment Notes Wound #1 (Right Toe Great) 1. Cleansed with: Clean wound with Normal Saline 2. Anesthetic Topical Lidocaine 4% cream to wound bed prior to debridement 4. Dressing Applied: Dry Gauze Notes oasis, mepitel and steri strips, conform Electronic Signature(s) Signed: 02/28/2018 4:42:32 PM By: Roger Shelter Entered By: Roger Shelter on 02/28/2018 08:43:18 Timothy Gibson, Timothy Gibson ([id number]) -------------------------------------------------------------------------------- Vitals Details Patient Name: Timothy Gibson Date of Service: 02/28/2018 8:30 AM Medical Record Number: [id number] Patient Account Number:  [id number] Date of Birth/Sex: [birth year] (67 y.o. M) Treating RN: Roger Shelter Primary Care Almee Pelphrey: PATIENT, NO Other Clinician: Referring Kashish Yglesias: Konrad Felix Treating Kivon Aprea/Extender: Cathie Olden in Treatment: 13 Vital Signs Time Taken: 08:36 Temperature (F): 98.0 Height (in): 76 Pulse (bpm): 83 Weight (lbs): 220 Respiratory Rate (breaths/min): 18 Body Mass Index (BMI): 26.8 Blood Pressure (mmHg): 191/91 Reference Range: 80 - 120 mg / dl Electronic Signature(s) Signed: 02/28/2018 4:42:32 PM By: Roger Shelter Entered By: Roger Shelter on 02/28/2018 08:36:43

## 2018-03-11 DIAGNOSIS — N2581 Secondary hyperparathyroidism of renal origin: Secondary | ICD-10-CM | POA: Diagnosis not present

## 2018-03-11 DIAGNOSIS — N186 End stage renal disease: Secondary | ICD-10-CM | POA: Diagnosis not present

## 2018-03-11 DIAGNOSIS — D509 Iron deficiency anemia, unspecified: Secondary | ICD-10-CM | POA: Diagnosis not present

## 2018-03-11 DIAGNOSIS — Z992 Dependence on renal dialysis: Secondary | ICD-10-CM | POA: Diagnosis not present

## 2018-03-11 DIAGNOSIS — Z23 Encounter for immunization: Secondary | ICD-10-CM | POA: Diagnosis not present

## 2018-03-11 DIAGNOSIS — D631 Anemia in chronic kidney disease: Secondary | ICD-10-CM | POA: Diagnosis not present

## 2018-03-13 DIAGNOSIS — Z23 Encounter for immunization: Secondary | ICD-10-CM | POA: Diagnosis not present

## 2018-03-13 DIAGNOSIS — Z992 Dependence on renal dialysis: Secondary | ICD-10-CM | POA: Diagnosis not present

## 2018-03-13 DIAGNOSIS — D631 Anemia in chronic kidney disease: Secondary | ICD-10-CM | POA: Diagnosis not present

## 2018-03-13 DIAGNOSIS — D509 Iron deficiency anemia, unspecified: Secondary | ICD-10-CM | POA: Diagnosis not present

## 2018-03-13 DIAGNOSIS — N2581 Secondary hyperparathyroidism of renal origin: Secondary | ICD-10-CM | POA: Diagnosis not present

## 2018-03-13 DIAGNOSIS — N186 End stage renal disease: Secondary | ICD-10-CM | POA: Diagnosis not present

## 2018-03-13 NOTE — Progress Notes (Signed)
Timothy Gibson ([id number]) Visit Report for 02/28/2018 Chief Complaint Document Details Patient Name: Timothy Gibson, Timothy Gibson Date of Service: 02/28/2018 8:30 AM Medical Record Number: [id number] Patient Account Number: [id number] Date of Birth/Sex: 05-Nov-1950 (67 y.o. M) Treating RN: Ahmed Prima Primary Care Provider: PATIENT, NO Other Clinician: Referring Provider: Konrad Felix Treating Provider/Extender: Cathie Olden in Treatment: 13 Information Obtained from: Patient Chief Complaint Right Great Toe Electronic Signature(s) Signed: 02/28/2018 9:11:28 AM By: Lawanda Cousins Entered By: Lawanda Cousins on 02/28/2018 09:11:28 Timothy Gibson ([id number]) -------------------------------------------------------------------------------- Cellular or Tissue Based Product Details Patient Name: Timothy Gibson Date of Service: 02/28/2018 8:30 AM Medical Record Number: [id number] Patient Account Number: [id number] Date of Birth/Sex: 04-12-1951 (67 y.o. M) Treating RN: Roger Shelter Primary Care Provider: PATIENT, NO Other Clinician: Referring Provider: Konrad Felix Treating Provider/Extender: Cathie Olden in Treatment: 13 Cellular or Tissue Based Wound #1 Right Toe Great Product Type Applied to: Performed By: Physician Lawanda Cousins, NP Cellular or Tissue Based Oasis wound matrix Product Type: Pre-procedure Verification/Time Yes - 08:57 Out Taken: Location: genitalia / hands / feet / multiple digits Wound Size (sq cm): 0.04 Product Size (sq cm): 7 Waste Size (sq cm): 6.06 Waste Reason: wound size Amount of Product Applied (sq cm): 0.94 Instrument Used: Blade Lot #: [id number] Expiration Date: 09/18/2019 Fenestrated: Yes Reconstituted: Yes Solution Type: NS Solution Amount: 3ML Lot #: [id number] Solution Expiration Date: 09/08/2019 Secured: Yes Secured With: Steri-Strips Dressing Applied: Yes Primary Dressing: mepitel one Procedural Pain: 0 Post Procedural  Pain: 0 Response to Treatment: Procedure was tolerated well Level of Consciousness: Awake and Alert Post Procedure Diagnosis Same as Pre-procedure Electronic Signature(s) Signed: 02/28/2018 4:42:32 PM By: Roger Shelter Entered By: Roger Shelter on 02/28/2018 09:01:12 Timothy Gibson ([id number]) -------------------------------------------------------------------------------- Debridement Details Patient Name: Timothy Gibson Date of Service: 02/28/2018 8:30 AM Medical Record Number: [id number] Patient Account Number: [id number] Date of Birth/Sex: 05/14/1951 (67 y.o. M) Treating RN: Ahmed Prima Primary Care Provider: PATIENT, NO Other Clinician: Referring Provider: Konrad Felix Treating Provider/Extender: Cathie Olden in Treatment: 13 Debridement Performed for Wound #1 Right Toe Great Assessment: Performed By: Physician Lawanda Cousins, NP Debridement Type: Debridement Severity of Tissue Pre Fat layer exposed Debridement: Pre-procedure Verification/Time Yes - 08:55 Out Taken: Start Time: 08:55 Pain Control: Other : lidocaine 4% Total Area Debrided (L x W): 0.2 (cm) x 0.2 (cm) = 0.04 (cm) Tissue and other material Viable, Skin: Dermis , Fibrin/Exudate debrided: Level: Skin/Dermis Debridement Description: Selective/Open Wound Instrument: Curette Bleeding: Minimum Hemostasis Achieved: Pressure End Time: 08:56 Procedural Pain: 0 Post Procedural Pain: 0 Response to Treatment: Procedure was tolerated well Level of Consciousness: Awake and Alert Post Debridement Measurements of Total Wound Length: (cm) 0.2 Width: (cm) 0.2 Depth: (cm) 0.1 Volume: (cm) 0.003 Character of Wound/Ulcer Post Debridement: Stable Severity of Tissue Post Debridement: Fat layer exposed Post Procedure Diagnosis Same as Pre-procedure Electronic Signature(s) Signed: 02/28/2018 9:11:18 AM By: Lawanda Cousins Signed: 03/01/2018 3:54:45 PM By: Alric Quan Entered By:  Lawanda Cousins on 02/28/2018 09:11:18 Timothy Gibson ([id number]) -------------------------------------------------------------------------------- HPI Details Patient Name: Timothy Gibson Date of Service: 02/28/2018 8:30 AM Medical Record Number: [id number] Patient Account Number: [id number] Date of Birth/Sex: Apr 29, 1951 (67 y.o. M) Treating RN: Ahmed Prima Primary Care Provider: PATIENT, NO Other Clinician: Referring Provider: Konrad Felix Treating Provider/Extender: Cathie Olden in Treatment: 13 History of Present Illness HPI Description: 11/27/17 on evaluation today patient presents for initial evaluation concerning an issue that he has been having with his right great  toe which began last Thursday 11/22/17. He has a history of diabetes mellitus type to which he has had for greater than 30 years currently he is on insulin. Subsequently he also has a history of hypertension. On physical exam inspection is also appears he may have some peripheral vascular disease with his ABI's being noncompressible bilaterally. He is on dialysis as well and this is on Monday, Wednesday, and Friday. Patient was hospitalized back in January/February 2019 although this was more for stomach/back pain though they never told me exactly what was going on. This is according to the patient. Subsequently the ulcer which is between the third and fourth toe when space of the right foot as well is on the plantar aspect of the fourth toe is due to him having cleaned his toes this morning and he states that his finger which is large split the toe tissue in between causing the wounds that we currently see. With that being said he states this has happened before I explained I would definitely recommend that he not do this any longer. He can use a small soft washcloth to clean between the toes without causing trauma. Subsequently the right great toe hatchery does show evidence of necrotic tissue present on  the surface of the wound specifically there is callous and dead skin surrounding which is trapping fluid causing problems as far as the wound is concerned. The surface of the wound does show slough although due to his vascular flow I'm not going to sharply debride this today I think I will selectively debride away the necrotic skin as well is callous from around the surrounding so this will not continue to be a moisture issue. Nonetheless the patient has no pain he does have diabetic neuropathy. 12/06/17-He is here in follow-up evaluation for a right great toe ulcer. He is voicing no complaints or concerns. He continues to infrequently/socially smokes cigars with no desire to quit. He has been compliant with offloading, using open toed surgical shoe; has been compliant using Santyl daily. He continues on clindamycin although takes it inconsistently secondary to indigestion. The plain film x-ray performed on 5/23 impression: Soft tissue swelling of the left first toe and is noted with probable irregular lucency involving distal tuft of first distal phalanx concerning for osteomyelitis. MRI may be performed for further evaluation; MRI ordered. The vascular evaluation performed on 5/24 field non-compressible ABIs bilaterally and reduced TBI bilaterally suggesting significant tibial disease. He will be referred to vascular medicine for further evaluation. 12/13/17-He is here in follow-up evaluation for right great toe ulcer. He has appointment next Thursday for the MRI and vascular evaluation. Wound culture obtained last week grew abundant Klebsiella oxytoca (multidrug sensitivity), and abundant enteric coccus faecalis (sensitive to ampicillin). Ampicillin and Cipro were called in, along with a probiotic. In light of his appointments next Thursday he will follow-up in 2 weeks, we will continue with Santyl 12/27/17-He is here for evaluation for a right great toe ulcer. MRI performed on 6/13 revealed  osteomyelitis at the distal phalanx of the great toe, negative for abscess or septic joint. He did have evaluation by vascular medicine, Dr. Ronalee Belts, on 6/13, at that appointment it was decided for him to undergo angiography with possible intervention but he refused and is still considering. If he chooses to have it done he will contact the office. He has not heard back from either ID offices that he was referred to two weeks ago: [place] and [place]. there is improvement in both appearance  and measurement to the ulcer. We will extend antibiotic therapy (amoxicillin 500 every 12, Cipro 500 daily) for additional 2 weeks pending infectious disease consult, he will continue with Santyl daily. He was reminded to continue with probiotic therapy while on oral antibiotics; he denies any GI disturbance. 01/03/18-He is here in follow-up evaluation for right great toe ulcer. He is decided to not undergo any vascular intervention at this time. He was established with [facility] infectious disease (Dr. Linus Salmons) last week initiated on vancomycin and cefazolin with dialysis treatment for 6 weeks. We will continue with current treatment plan with Santyl and offloading and he will follow- up in 2 weeks 01/17/18-He is seen in follow-up evaluation for right great toe ulcer. There is improvement in appearance with less nonviable tissue present. We will continue with same treatment plan he will follow-up next week. He is tolerating IV antibiotics without any complications 02/25/55-DJ is seen in follow up evaluation for right great toe ulcer. There continues to be improvement. He is tolerating IV antibiotics with hemodialysis, completion date of 7/31. We will transition to collagen and and follow-up next week 02/07/18-He is here in follow up for a right great toe ulcer. There is red granulation tissue throughout the wound, improved in appearance. He completed antibiotics yesterday. He saw podiatry last Thursday for nail  trimming, at that appointment he periwound callus was trimmed and a culture was obtained; culture negative. He does not have a follow up appointment with TRAYDEN, BRANDY. ([id number]) infectious disease. We will submit for grafix. 02/14/18-He is here in follow-up evaluation for a right great toe ulcer. There is slow improvement, red granulation tissue throughout. The insurance approval for grafix is pending. We will continue with collagen and he'll follow-up next week 02/21/18-He is seen in follow-up evaluation for right great toe ulcer. There is improvement with red granulation tissue throughout. He was approved for oasis and this was placed today. He will follow up next week 02/28/18-He is seen in follow-up evaluation for right great toe ulcer. The wound is stable, oasis applied and he'll follow-up next week Electronic Signature(s) Signed: 02/28/2018 9:12:41 AM By: Lawanda Cousins Entered By: Lawanda Cousins on 02/28/2018 09:12:41 TIMMY, BUBECK ([id number]) -------------------------------------------------------------------------------- Physician Orders Details Patient Name: Timothy Gibson Date of Service: 02/28/2018 8:30 AM Medical Record Number: [id number] Patient Account Number: [id number] Date of Birth/Sex: [birth year] (67 y.o. M) Treating RN: Roger Shelter Primary Care Provider: PATIENT, NO Other Clinician: Referring Provider: Konrad Felix Treating Provider/Extender: Cathie Olden in Treatment: 13 Verbal / Phone Orders: No Diagnosis Coding Wound Cleansing Wound #1 Right Toe Great o Clean wound with Normal Saline. Anesthetic (add to Medication List) Wound #1 Right Toe Great o Topical Lidocaine 4% cream applied to wound bed prior to debridement (In Clinic Only). Secondary Dressing Wound #1 Right Toe Great o Dry Gauze o Conform/Kerlix Dressing Change Frequency Wound #1 Right Toe Great o Change dressing every week Follow-up Appointments Wound #1 Right Toe  Great o Return Appointment in 1 week. Advanced Therapies Wound #1 Right Toe Great o Oasis application in clinic; including contact layer, fixation with steri strips, dry gauze and cover dressing. Electronic Signature(s) Signed: 02/28/2018 4:42:32 PM By: Roger Shelter Signed: 03/12/2018 5:47:54 PM By: Lawanda Cousins Entered By: Roger Shelter on 02/28/2018 09:07:03 KALETH, KOY ([id number]) -------------------------------------------------------------------------------- Problem List Details Patient Name: Timothy Gibson Date of Service: 02/28/2018 8:30 AM Medical Record Number: [id number] Patient Account Number: [id number] Date of Birth/Sex: 08-Dec-1950 (67 y.o. 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/27/2017 No Yes layer exposed E11.621 Type 2 diabetes mellitus with foot ulcer 11/27/2017 No Yes I70.235 Atherosclerosis of native arteries of right leg with ulceration of 12/06/2017 No Yes other part of foot M86.671 Other chronic osteomyelitis, right ankle and foot 12/27/2017 No Yes I10 Essential (primary) hypertension 11/27/2017 No Yes N18.6 End stage renal disease 11/27/2017 No Yes Inactive Problems Resolved Problems ICD-10 Code Description Active Date Resolved Date E44.1 Mild protein-calorie malnutrition 11/27/2017 11/27/2017 Electronic Signature(s) Signed: 02/28/2018 9:10:46 AM By: Lawanda Cousins Entered By: Lawanda Cousins on 02/28/2018 09:10:46 Timothy Gibson ([id number]) -------------------------------------------------------------------------------- Progress Note Details Patient Name: Timothy Gibson Date of Service: 02/28/2018 8:30 AM Medical Record Number:  [id number] Patient Account Number: [id number] Date of Birth/Sex: 02/03/1951 (67 y.o. M) Treating RN: Ahmed Prima Primary Care Provider: PATIENT, NO Other Clinician: Referring Provider: Konrad Felix Treating Provider/Extender: Cathie Olden in Treatment: 13 Subjective Chief Complaint Information obtained from Patient Right Great Toe History of Present Illness (HPI) 11/27/17 on evaluation today patient presents for initial evaluation concerning an issue that he has been having with his right great toe which began last Thursday 11/22/17. He has a history of diabetes mellitus type to which he has had for greater than 30 years currently he is on insulin. Subsequently he also has a history of hypertension. On physical exam inspection is also appears he may have some peripheral vascular disease with his ABI's being noncompressible bilaterally. He is on dialysis as well and this is on Monday, Wednesday, and Friday. Patient was hospitalized back in January/February 2019 although this was more for stomach/back pain though they never told me exactly what was going on. This is according to the patient. Subsequently the ulcer which is between the third and fourth toe when space of the right foot as well is on the plantar aspect of the fourth toe is due to him having cleaned his toes this morning and he states that his finger which is large split the toe tissue in between causing the wounds that we currently see. With that being said he states this has happened before I explained I would definitely recommend that he not do this any longer. He can use a small soft washcloth to clean between the toes without causing trauma. Subsequently the right great toe hatchery does show evidence of necrotic tissue present on the surface of the wound specifically there is callous and dead skin surrounding which is trapping fluid causing problems as far as the wound is concerned. The surface of the wound does  show slough although due to his vascular flow I'm not going to sharply debride this today I think I will selectively debride away the necrotic skin as well is callous from around the surrounding so this will not continue to be a moisture issue. Nonetheless the patient has no pain he does have diabetic neuropathy. 12/06/17-He is here in follow-up evaluation for a right great toe ulcer. He is voicing no complaints or concerns. He continues to infrequently/socially smokes cigars with no desire to quit. He has been compliant with offloading, using open toed surgical shoe; has been compliant using Santyl daily. He continues on clindamycin although takes it inconsistently secondary to indigestion. The plain film x-ray performed on 5/23 impression: Soft tissue swelling of the left  first toe and is noted with probable irregular lucency involving distal tuft of first distal phalanx concerning for osteomyelitis. MRI may be performed for further evaluation; MRI ordered. The vascular evaluation performed on 5/24 field non-compressible ABIs bilaterally and reduced TBI bilaterally suggesting significant tibial disease. He will be referred to vascular medicine for further evaluation. 12/13/17-He is here in follow-up evaluation for right great toe ulcer. He has appointment next Thursday for the MRI and vascular evaluation. Wound culture obtained last week grew abundant Klebsiella oxytoca (multidrug sensitivity), and abundant enteric coccus faecalis (sensitive to ampicillin). Ampicillin and Cipro were called in, along with a probiotic. In light of his appointments next Thursday he will follow-up in 2 weeks, we will continue with Santyl 12/27/17-He is here for evaluation for a right great toe ulcer. 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 He did have evaluation by vascular medicine, Dr. Ronalee Belts, on 6/13, at that appointment it was decided  for him to undergo angiography with possible intervention but he refused and is still considering. If he chooses to have it done he will contact the office. He has not heard back from either ID offices that he was referred to two weeks ago: [place] and [place]. there is improvement in both appearance and measurement to the ulcer. We will extend antibiotic therapy (amoxicillin 500 every 12, Cipro 500 daily) for additional 2 weeks pending infectious disease consult, he will continue with Santyl daily. He was reminded to continue with probiotic therapy while on oral antibiotics; he denies any GI disturbance. 01/03/18-He is here in follow-up evaluation for right great toe ulcer. He is decided to not undergo any vascular intervention at this time. He was established with [facility] infectious disease (Dr. Linus Salmons) last week initiated on vancomycin and cefazolin with dialysis treatment for 6 weeks. We will continue with current treatment plan with Santyl and offloading and he will follow- up in 2 weeks 01/17/18-He is seen in follow-up evaluation for right great toe ulcer. There is improvement in appearance with less nonviable tissue present. We will continue with same treatment plan he will follow-up next week. He is tolerating IV antibiotics without KENSHAWN, MACIOLEK. ([id number]) any complications 0/86/57-QI is seen in follow up evaluation for right great toe ulcer. There continues to be improvement. He is tolerating IV antibiotics with hemodialysis, completion date of 7/31. We will transition to collagen and and follow-up next week 02/07/18-He is here in follow up for a right great toe ulcer. There is red granulation tissue throughout the wound, improved in appearance. He completed antibiotics yesterday. He saw podiatry last Thursday for nail trimming, at that appointment he periwound callus was trimmed and a culture was obtained; culture negative. He does not have a follow up appointment with infectious  disease. We will submit for grafix. 02/14/18-He is here in follow-up evaluation for a right great toe ulcer. There is slow improvement, red granulation tissue throughout. The insurance approval for grafix is pending. We will continue with collagen and he'll follow-up next week 02/21/18-He is seen in follow-up evaluation for right great toe ulcer. There is improvement with red granulation tissue throughout. He was approved for oasis and this was placed today. He will follow up next week 02/28/18-He is seen in follow-up evaluation for right great toe ulcer. The wound is stable, oasis applied and he'll follow-up next week Patient History Information obtained from Patient. Family History Cancer - Mother,Father, Diabetes - Mother, Heart Disease -  Mother, Hypertension - Mother,Father, Stroke - Father, Thyroid Problems - Mother, No family history of Kidney Disease, Lung Disease, Seizures, Tuberculosis. Social History Former smoker - cigar about 4 to 5 days, Marital Status - Divorced, Alcohol Use - Never, Drug Use - No History, Caffeine Use - Daily. Medical History Hospitalization/Surgery History - 08/11/2017, [facility], stomach and back pain. Review of Systems (ROS) Constitutional Symptoms (General Health) Denies complaints or symptoms of Fatigue, Fever, Chills. Respiratory Denies complaints or symptoms of Chronic or frequent coughs, Shortness of Breath. Cardiovascular Denies complaints or symptoms of LE edema. Gastrointestinal Denies complaints or symptoms of Frequent diarrhea, Nausea, Vomiting. Objective Constitutional Vitals Time Taken: 8:36 AM, Height: 76 in, Weight: 220 lbs, BMI: 26.8, Temperature: 98.0 F, Pulse: 83 bpm, Respiratory Rate: 18 breaths/min, Blood Pressure: 191/91 mmHg. Integumentary (Hair, Skin) Wound #1 status is Open. Original cause of wound was Gradually Appeared. The wound is located on the Right Toe Great. The wound measures 0.2cm length x 0.2cm width x 0.1cm depth; 0.031cm^2  area and 0.003cm^3 volume. There is Fat JIBRAN, CROOKSHANKS. ([id number]) (Subcutaneous Tissue) Exposed exposed. There is no tunneling or undermining noted. There is a medium amount of serous drainage noted. The wound margin is well defined and not attached to the wound base. There is large (67-100%) red, pink granulation within the wound bed. There is a small (1-33%) amount of necrotic tissue within the wound bed including Eschar and Adherent Slough. The periwound skin appearance exhibited: Callus. The periwound skin appearance did not exhibit: Crepitus, Excoriation, Induration, Rash, Scarring, Dry/Scaly, Maceration, Atrophie Blanche, Cyanosis, Ecchymosis, Hemosiderin Staining, Mottled, Pallor, Rubor, Erythema. Periwound temperature was noted as No Abnormality. 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 There was a Selective/Open Wound Skin/Dermis Debridement with a total area of 0.04 sq cm performed by Lawanda Cousins, NP. With the following instrument(s): Curette to remove Viable tissue/material. Material removed includes Skin: Dermis and Fibrin/Exudate and after achieving pain control using Other (lidocaine 4%). No specimens were taken. A time out was conducted at 08:55, prior to the start of the procedure. A Minimum amount of bleeding was controlled with Pressure. The procedure was tolerated well with a pain level of 0 throughout and a pain level of 0 following the procedure. Patient s Level of  Consciousness post procedure was recorded as Awake and Alert. Post Debridement Measurements: 0.2cm length x 0.2cm width x 0.1cm depth; 0.003cm^3 volume. Character of Wound/Ulcer Post Debridement is stable. Severity of Tissue Post Debridement is: Fat layer exposed. Post procedure Diagnosis Wound #1: Same as Pre-Procedure Pre-procedure diagnosis of Wound #1 is a Diabetic Wound/Ulcer of the Lower Extremity located on the Right Toe Great. A skin graft procedure using a bioengineered skin substitute/cellular or tissue based product was performed by Lawanda Cousins, NP with the following instrument(s): Blade. Oasis wound matrix was applied and secured with Steri-Strips. 0.94 sq cm of product was utilized and 6.06 sq cm was wasted due to wound size. Post Application, mepitel one was applied. A Time Out was conducted at 08:57, prior to the start of the procedure.  The procedure was tolerated well with a pain level of 0 throughout and a pain level of 0 following the procedure. Patient s Level of Consciousness post procedure was recorded as Awake and Alert. Post procedure Diagnosis Wound #1: Same as Pre-Procedure . Plan LUKKA, BLACK ([id number]) Wound Cleansing: Wound #1 Right Toe Great: Clean wound with Normal Saline. Anesthetic (add to Medication List): Wound #1 Right Toe Great: Topical Lidocaine 4% cream applied to wound bed prior to debridement (In Clinic Only). Secondary Dressing: Wound #1 Right Toe Great: Dry Gauze Conform/Kerlix Dressing Change Frequency: Wound #1 Right Toe Great: Change dressing every week Follow-up Appointments: Wound #1 Right Toe Great: Return Appointment in 1 week. Advanced Therapies: Wound #1 Right Toe Great: Oasis application in clinic; including contact layer, fixation with steri strips, dry gauze and cover dressing. Electronic Signature(s) Signed: 02/28/2018 9:13:21 AM By: Lawanda Cousins Entered By: Lawanda Cousins on 02/28/2018 09:13:21 KANON, NOVOSEL  ([id number]) -------------------------------------------------------------------------------- ROS/PFSH Details Patient Name: Timothy Gibson Date of Service: 02/28/2018 8:30 AM Medical Record Number: [id number] Patient Account Number: [id number] Date of Birth/Sex: [birth year] (67 y.o. M) Treating RN: Ahmed Prima Primary Care Provider: PATIENT, NO Other Clinician: Referring Provider: Konrad Felix Treating Provider/Extender: Cathie Olden in Treatment: 13 Information Obtained From Patient Wound History Constitutional Symptoms (General Health) Complaints and Symptoms: Negative for: Fatigue; Fever; Chills Respiratory Complaints and Symptoms: Negative for: Chronic or frequent coughs; Shortness of Breath Medical History: Negative for: Aspiration; Asthma; Chronic Obstructive Pulmonary Disease (COPD); Pneumothorax; Sleep Apnea; Tuberculosis Cardiovascular Complaints and Symptoms: Negative for: LE edema Medical History: Positive for: Arrhythmia; Hypertension Negative for: Congestive Heart Failure; Coronary Artery Disease; Deep Vein Thrombosis; Hypotension; Myocardial Infarction; Peripheral Arterial Disease; Peripheral Venous Disease; Phlebitis; Vasculitis Gastrointestinal Complaints and Symptoms: Negative for: Frequent diarrhea; Nausea; Vomiting Medical History: Negative for: Cirrhosis ; Colitis; Crohnos; Hepatitis A; Hepatitis B; Hepatitis C Eyes Medical History: Negative for: Cataracts; Glaucoma; Optic Neuritis Ear/Nose/Mouth/Throat Medical History: Negative for: Chronic sinus problems/congestion; Middle ear problems Hematologic/Lymphatic Medical History: Positive for: Lymphedema ROBBERT, LANGLINAIS ([id number]) Negative for: Anemia; Hemophilia; Human Immunodeficiency Virus; Sickle Cell Disease Endocrine Medical History: Positive for: Type II Diabetes Time with diabetes: 30 years Treated with: Diet Blood sugar tested every day: No Genitourinary Medical  History: Positive for: End Stage Renal Disease Immunological Medical History: Negative for: Lupus Erythematosus; Raynaudos Musculoskeletal Medical History: Positive for: Rheumatoid Arthritis; Osteoarthritis Negative for: Gout; Osteomyelitis Neurologic Medical History: Negative for: Dementia; Neuropathy; Quadriplegia; Paraplegia; Seizure Disorder Oncologic Medical History: Negative for: Received Chemotherapy; Received Radiation Psychiatric Medical History: Positive for: Confinement Anxiety Negative for: Anorexia/bulimia Immunizations Pneumococcal Vaccine: Received Pneumococcal Vaccination: Yes Implantable Devices Hospitalization / Surgery History Name of Hospital Purpose of Hospitalization/Surgery Date [facility] stomach and back pain 08/11/2017 Family and Social History Cancer: Yes - Mother,Father; Diabetes: Yes - Mother; Heart Disease: Yes - Mother; Hypertension: Yes - Mother,Father; Kidney Disease: No; Lung Disease: No; Seizures: No; Stroke: Yes - Father; Thyroid Problems: Yes - Mother; Tuberculosis: No; Former smoker - cigar about 4 to 5 days; Marital Status - Divorced; Alcohol Use: Never; Drug Use: No History; Caffeine Use: Daily; Financial Concerns: No; Food, Games developer or Shelter Needs: No; Support System Lacking: No; Transportation AIMAR, BORGHI ([id number]) Concerns: No; Advanced Directives: Yes (Not Provided); Patient does not want information on Advanced Directives; Do not resuscitate: No; Living Will: Yes (Not Provided); Medical Power of Attorney: Yes (Not Provided) Physician Affirmation I have reviewed and agree with the above information. Electronic Signature(s) Signed: 03/01/2018 3:54:45 PM By: Alric Quan  Signed: 03/12/2018 5:47:54 PM By: Lawanda Cousins Entered By: Lawanda Cousins on 02/28/2018 09:13:09 Timothy Gibson ([id number]) -------------------------------------------------------------------------------- [place] Details Patient Name: Timothy Gibson Date of Service: 02/28/2018 Medical Record Number: [id number] Patient Account Number: [id number] Date of Birth/Sex: 1951/03/12 (67 y.o. 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, right ankle and foot I10 Essential (primary) hypertension N18.6 End stage renal disease Facility Procedures CPT4: Description Modifier Quantity Code [id number] Q4102- Dermal substitute tissue/non-human origin w metabolically active elements- 11 Oasis (Wound Matrix)product applied per sq cm CPT4: [id number] (Facility Use Only) C5271 - 1ST 25SQCM TO SM AR TRK,ARMS LEGS 1 ICD-10 Diagnosis Description L97.512 Non-pressure chronic ulcer of other part of right foot with fat layer exposed Physician Procedures CPT4: Description Modifier Quantity Code [phone] (Facility Use Only) Application of skin substitute graft to trunk, arms, legs, total wound 1 surface area up to 100 sq cm; first 25 sqcm or less wound surface area ICD-10 Diagnosis Description L97.512  Non-pressure chronic ulcer of other part of right foot with fat layer exposed Electronic Signature(s) Signed: 03/04/2018 4:21:00 PM By: Roger Shelter Signed: 03/12/2018 5:47:54 PM By: Lawanda Cousins Previous Signature: 02/28/2018 9:15:12 AM Version By: Lawanda Cousins Entered By: Roger Shelter on 03/04/2018 16:20:26

## 2018-03-14 DIAGNOSIS — L97521 Non-pressure chronic ulcer of other part of left foot limited to breakdown of skin: Secondary | ICD-10-CM | POA: Diagnosis not present

## 2018-03-15 DIAGNOSIS — Z23 Encounter for immunization: Secondary | ICD-10-CM | POA: Diagnosis not present

## 2018-03-15 DIAGNOSIS — N186 End stage renal disease: Secondary | ICD-10-CM | POA: Diagnosis not present

## 2018-03-15 DIAGNOSIS — D509 Iron deficiency anemia, unspecified: Secondary | ICD-10-CM | POA: Diagnosis not present

## 2018-03-15 DIAGNOSIS — Z992 Dependence on renal dialysis: Secondary | ICD-10-CM | POA: Diagnosis not present

## 2018-03-15 DIAGNOSIS — D631 Anemia in chronic kidney disease: Secondary | ICD-10-CM | POA: Diagnosis not present

## 2018-03-15 DIAGNOSIS — N2581 Secondary hyperparathyroidism of renal origin: Secondary | ICD-10-CM | POA: Diagnosis not present

## 2018-03-18 DIAGNOSIS — N2581 Secondary hyperparathyroidism of renal origin: Secondary | ICD-10-CM | POA: Diagnosis not present

## 2018-03-18 DIAGNOSIS — D631 Anemia in chronic kidney disease: Secondary | ICD-10-CM | POA: Diagnosis not present

## 2018-03-18 DIAGNOSIS — Z992 Dependence on renal dialysis: Secondary | ICD-10-CM | POA: Diagnosis not present

## 2018-03-18 DIAGNOSIS — D509 Iron deficiency anemia, unspecified: Secondary | ICD-10-CM | POA: Diagnosis not present

## 2018-03-18 DIAGNOSIS — N186 End stage renal disease: Secondary | ICD-10-CM | POA: Diagnosis not present

## 2018-03-18 DIAGNOSIS — Z23 Encounter for immunization: Secondary | ICD-10-CM | POA: Diagnosis not present

## 2018-03-20 DIAGNOSIS — N2581 Secondary hyperparathyroidism of renal origin: Secondary | ICD-10-CM | POA: Diagnosis not present

## 2018-03-20 DIAGNOSIS — Z992 Dependence on renal dialysis: Secondary | ICD-10-CM | POA: Diagnosis not present

## 2018-03-20 DIAGNOSIS — N186 End stage renal disease: Secondary | ICD-10-CM | POA: Diagnosis not present

## 2018-03-20 DIAGNOSIS — Z23 Encounter for immunization: Secondary | ICD-10-CM | POA: Diagnosis not present

## 2018-03-20 DIAGNOSIS — D509 Iron deficiency anemia, unspecified: Secondary | ICD-10-CM | POA: Diagnosis not present

## 2018-03-20 DIAGNOSIS — D631 Anemia in chronic kidney disease: Secondary | ICD-10-CM | POA: Diagnosis not present

## 2018-03-22 DIAGNOSIS — Z992 Dependence on renal dialysis: Secondary | ICD-10-CM | POA: Diagnosis not present

## 2018-03-22 DIAGNOSIS — Z23 Encounter for immunization: Secondary | ICD-10-CM | POA: Diagnosis not present

## 2018-03-22 DIAGNOSIS — N186 End stage renal disease: Secondary | ICD-10-CM | POA: Diagnosis not present

## 2018-03-22 DIAGNOSIS — N2581 Secondary hyperparathyroidism of renal origin: Secondary | ICD-10-CM | POA: Diagnosis not present

## 2018-03-22 DIAGNOSIS — D509 Iron deficiency anemia, unspecified: Secondary | ICD-10-CM | POA: Diagnosis not present

## 2018-03-22 DIAGNOSIS — D631 Anemia in chronic kidney disease: Secondary | ICD-10-CM | POA: Diagnosis not present

## 2018-03-25 DIAGNOSIS — D631 Anemia in chronic kidney disease: Secondary | ICD-10-CM | POA: Diagnosis not present

## 2018-03-25 DIAGNOSIS — Z23 Encounter for immunization: Secondary | ICD-10-CM | POA: Diagnosis not present

## 2018-03-25 DIAGNOSIS — N2581 Secondary hyperparathyroidism of renal origin: Secondary | ICD-10-CM | POA: Diagnosis not present

## 2018-03-25 DIAGNOSIS — N186 End stage renal disease: Secondary | ICD-10-CM | POA: Diagnosis not present

## 2018-03-25 DIAGNOSIS — Z992 Dependence on renal dialysis: Secondary | ICD-10-CM | POA: Diagnosis not present

## 2018-03-25 DIAGNOSIS — D509 Iron deficiency anemia, unspecified: Secondary | ICD-10-CM | POA: Diagnosis not present

## 2018-03-27 DIAGNOSIS — D631 Anemia in chronic kidney disease: Secondary | ICD-10-CM | POA: Diagnosis not present

## 2018-03-27 DIAGNOSIS — Z992 Dependence on renal dialysis: Secondary | ICD-10-CM | POA: Diagnosis not present

## 2018-03-27 DIAGNOSIS — D509 Iron deficiency anemia, unspecified: Secondary | ICD-10-CM | POA: Diagnosis not present

## 2018-03-27 DIAGNOSIS — Z23 Encounter for immunization: Secondary | ICD-10-CM | POA: Diagnosis not present

## 2018-03-27 DIAGNOSIS — N186 End stage renal disease: Secondary | ICD-10-CM | POA: Diagnosis not present

## 2018-03-27 DIAGNOSIS — N2581 Secondary hyperparathyroidism of renal origin: Secondary | ICD-10-CM | POA: Diagnosis not present

## 2018-03-29 DIAGNOSIS — N186 End stage renal disease: Secondary | ICD-10-CM | POA: Diagnosis not present

## 2018-03-29 DIAGNOSIS — Z992 Dependence on renal dialysis: Secondary | ICD-10-CM | POA: Diagnosis not present

## 2018-03-29 DIAGNOSIS — N2581 Secondary hyperparathyroidism of renal origin: Secondary | ICD-10-CM | POA: Diagnosis not present

## 2018-03-29 DIAGNOSIS — D509 Iron deficiency anemia, unspecified: Secondary | ICD-10-CM | POA: Diagnosis not present

## 2018-03-29 DIAGNOSIS — Z23 Encounter for immunization: Secondary | ICD-10-CM | POA: Diagnosis not present

## 2018-03-29 DIAGNOSIS — D631 Anemia in chronic kidney disease: Secondary | ICD-10-CM | POA: Diagnosis not present

## 2018-04-01 DIAGNOSIS — N186 End stage renal disease: Secondary | ICD-10-CM | POA: Diagnosis not present

## 2018-04-01 DIAGNOSIS — D509 Iron deficiency anemia, unspecified: Secondary | ICD-10-CM | POA: Diagnosis not present

## 2018-04-01 DIAGNOSIS — Z992 Dependence on renal dialysis: Secondary | ICD-10-CM | POA: Diagnosis not present

## 2018-04-01 DIAGNOSIS — D631 Anemia in chronic kidney disease: Secondary | ICD-10-CM | POA: Diagnosis not present

## 2018-04-01 DIAGNOSIS — N2581 Secondary hyperparathyroidism of renal origin: Secondary | ICD-10-CM | POA: Diagnosis not present

## 2018-04-01 DIAGNOSIS — Z23 Encounter for immunization: Secondary | ICD-10-CM | POA: Diagnosis not present

## 2018-04-03 DIAGNOSIS — Z23 Encounter for immunization: Secondary | ICD-10-CM | POA: Diagnosis not present

## 2018-04-03 DIAGNOSIS — N2581 Secondary hyperparathyroidism of renal origin: Secondary | ICD-10-CM | POA: Diagnosis not present

## 2018-04-03 DIAGNOSIS — D631 Anemia in chronic kidney disease: Secondary | ICD-10-CM | POA: Diagnosis not present

## 2018-04-03 DIAGNOSIS — N186 End stage renal disease: Secondary | ICD-10-CM | POA: Diagnosis not present

## 2018-04-03 DIAGNOSIS — D509 Iron deficiency anemia, unspecified: Secondary | ICD-10-CM | POA: Diagnosis not present

## 2018-04-03 DIAGNOSIS — Z992 Dependence on renal dialysis: Secondary | ICD-10-CM | POA: Diagnosis not present

## 2018-04-05 DIAGNOSIS — D631 Anemia in chronic kidney disease: Secondary | ICD-10-CM | POA: Diagnosis not present

## 2018-04-05 DIAGNOSIS — Z992 Dependence on renal dialysis: Secondary | ICD-10-CM | POA: Diagnosis not present

## 2018-04-05 DIAGNOSIS — D509 Iron deficiency anemia, unspecified: Secondary | ICD-10-CM | POA: Diagnosis not present

## 2018-04-05 DIAGNOSIS — N186 End stage renal disease: Secondary | ICD-10-CM | POA: Diagnosis not present

## 2018-04-05 DIAGNOSIS — Z23 Encounter for immunization: Secondary | ICD-10-CM | POA: Diagnosis not present

## 2018-04-05 DIAGNOSIS — N2581 Secondary hyperparathyroidism of renal origin: Secondary | ICD-10-CM | POA: Diagnosis not present

## 2018-04-08 DIAGNOSIS — N2581 Secondary hyperparathyroidism of renal origin: Secondary | ICD-10-CM | POA: Diagnosis not present

## 2018-04-08 DIAGNOSIS — D509 Iron deficiency anemia, unspecified: Secondary | ICD-10-CM | POA: Diagnosis not present

## 2018-04-08 DIAGNOSIS — Z992 Dependence on renal dialysis: Secondary | ICD-10-CM | POA: Diagnosis not present

## 2018-04-08 DIAGNOSIS — D631 Anemia in chronic kidney disease: Secondary | ICD-10-CM | POA: Diagnosis not present

## 2018-04-08 DIAGNOSIS — N186 End stage renal disease: Secondary | ICD-10-CM | POA: Diagnosis not present

## 2018-04-08 DIAGNOSIS — Z23 Encounter for immunization: Secondary | ICD-10-CM | POA: Diagnosis not present

## 2018-04-10 DIAGNOSIS — D509 Iron deficiency anemia, unspecified: Secondary | ICD-10-CM | POA: Diagnosis not present

## 2018-04-10 DIAGNOSIS — N2581 Secondary hyperparathyroidism of renal origin: Secondary | ICD-10-CM | POA: Diagnosis not present

## 2018-04-10 DIAGNOSIS — D631 Anemia in chronic kidney disease: Secondary | ICD-10-CM | POA: Diagnosis not present

## 2018-04-10 DIAGNOSIS — N186 End stage renal disease: Secondary | ICD-10-CM | POA: Diagnosis not present

## 2018-04-10 DIAGNOSIS — Z992 Dependence on renal dialysis: Secondary | ICD-10-CM | POA: Diagnosis not present

## 2018-04-12 DIAGNOSIS — Z992 Dependence on renal dialysis: Secondary | ICD-10-CM | POA: Diagnosis not present

## 2018-04-12 DIAGNOSIS — D631 Anemia in chronic kidney disease: Secondary | ICD-10-CM | POA: Diagnosis not present

## 2018-04-12 DIAGNOSIS — D509 Iron deficiency anemia, unspecified: Secondary | ICD-10-CM | POA: Diagnosis not present

## 2018-04-12 DIAGNOSIS — N186 End stage renal disease: Secondary | ICD-10-CM | POA: Diagnosis not present

## 2018-04-12 DIAGNOSIS — N2581 Secondary hyperparathyroidism of renal origin: Secondary | ICD-10-CM | POA: Diagnosis not present

## 2018-04-15 DIAGNOSIS — Z992 Dependence on renal dialysis: Secondary | ICD-10-CM | POA: Diagnosis not present

## 2018-04-15 DIAGNOSIS — N186 End stage renal disease: Secondary | ICD-10-CM | POA: Diagnosis not present

## 2018-04-15 DIAGNOSIS — D631 Anemia in chronic kidney disease: Secondary | ICD-10-CM | POA: Diagnosis not present

## 2018-04-15 DIAGNOSIS — D509 Iron deficiency anemia, unspecified: Secondary | ICD-10-CM | POA: Diagnosis not present

## 2018-04-15 DIAGNOSIS — N2581 Secondary hyperparathyroidism of renal origin: Secondary | ICD-10-CM | POA: Diagnosis not present

## 2018-04-17 DIAGNOSIS — N186 End stage renal disease: Secondary | ICD-10-CM | POA: Diagnosis not present

## 2018-04-17 DIAGNOSIS — N2581 Secondary hyperparathyroidism of renal origin: Secondary | ICD-10-CM | POA: Diagnosis not present

## 2018-04-17 DIAGNOSIS — D631 Anemia in chronic kidney disease: Secondary | ICD-10-CM | POA: Diagnosis not present

## 2018-04-17 DIAGNOSIS — D509 Iron deficiency anemia, unspecified: Secondary | ICD-10-CM | POA: Diagnosis not present

## 2018-04-17 DIAGNOSIS — Z992 Dependence on renal dialysis: Secondary | ICD-10-CM | POA: Diagnosis not present

## 2018-04-19 DIAGNOSIS — N2581 Secondary hyperparathyroidism of renal origin: Secondary | ICD-10-CM | POA: Diagnosis not present

## 2018-04-19 DIAGNOSIS — N186 End stage renal disease: Secondary | ICD-10-CM | POA: Diagnosis not present

## 2018-04-19 DIAGNOSIS — D631 Anemia in chronic kidney disease: Secondary | ICD-10-CM | POA: Diagnosis not present

## 2018-04-19 DIAGNOSIS — D509 Iron deficiency anemia, unspecified: Secondary | ICD-10-CM | POA: Diagnosis not present

## 2018-04-19 DIAGNOSIS — Z992 Dependence on renal dialysis: Secondary | ICD-10-CM | POA: Diagnosis not present

## 2018-04-22 DIAGNOSIS — Z992 Dependence on renal dialysis: Secondary | ICD-10-CM | POA: Diagnosis not present

## 2018-04-22 DIAGNOSIS — N2581 Secondary hyperparathyroidism of renal origin: Secondary | ICD-10-CM | POA: Diagnosis not present

## 2018-04-22 DIAGNOSIS — D509 Iron deficiency anemia, unspecified: Secondary | ICD-10-CM | POA: Diagnosis not present

## 2018-04-22 DIAGNOSIS — D631 Anemia in chronic kidney disease: Secondary | ICD-10-CM | POA: Diagnosis not present

## 2018-04-22 DIAGNOSIS — E119 Type 2 diabetes mellitus without complications: Secondary | ICD-10-CM | POA: Diagnosis not present

## 2018-04-22 DIAGNOSIS — N186 End stage renal disease: Secondary | ICD-10-CM | POA: Diagnosis not present

## 2018-04-24 DIAGNOSIS — N2581 Secondary hyperparathyroidism of renal origin: Secondary | ICD-10-CM | POA: Diagnosis not present

## 2018-04-24 DIAGNOSIS — D509 Iron deficiency anemia, unspecified: Secondary | ICD-10-CM | POA: Diagnosis not present

## 2018-04-24 DIAGNOSIS — N186 End stage renal disease: Secondary | ICD-10-CM | POA: Diagnosis not present

## 2018-04-24 DIAGNOSIS — D631 Anemia in chronic kidney disease: Secondary | ICD-10-CM | POA: Diagnosis not present

## 2018-04-24 DIAGNOSIS — Z992 Dependence on renal dialysis: Secondary | ICD-10-CM | POA: Diagnosis not present

## 2018-04-25 DIAGNOSIS — E1140 Type 2 diabetes mellitus with diabetic neuropathy, unspecified: Secondary | ICD-10-CM | POA: Diagnosis not present

## 2018-04-25 DIAGNOSIS — B351 Tinea unguium: Secondary | ICD-10-CM | POA: Diagnosis not present

## 2018-04-25 DIAGNOSIS — Z794 Long term (current) use of insulin: Secondary | ICD-10-CM | POA: Diagnosis not present

## 2018-04-26 DIAGNOSIS — D509 Iron deficiency anemia, unspecified: Secondary | ICD-10-CM | POA: Diagnosis not present

## 2018-04-26 DIAGNOSIS — D631 Anemia in chronic kidney disease: Secondary | ICD-10-CM | POA: Diagnosis not present

## 2018-04-26 DIAGNOSIS — Z992 Dependence on renal dialysis: Secondary | ICD-10-CM | POA: Diagnosis not present

## 2018-04-26 DIAGNOSIS — N2581 Secondary hyperparathyroidism of renal origin: Secondary | ICD-10-CM | POA: Diagnosis not present

## 2018-04-26 DIAGNOSIS — N186 End stage renal disease: Secondary | ICD-10-CM | POA: Diagnosis not present

## 2018-04-29 DIAGNOSIS — D509 Iron deficiency anemia, unspecified: Secondary | ICD-10-CM | POA: Diagnosis not present

## 2018-04-29 DIAGNOSIS — N2581 Secondary hyperparathyroidism of renal origin: Secondary | ICD-10-CM | POA: Diagnosis not present

## 2018-04-29 DIAGNOSIS — D631 Anemia in chronic kidney disease: Secondary | ICD-10-CM | POA: Diagnosis not present

## 2018-04-29 DIAGNOSIS — N186 End stage renal disease: Secondary | ICD-10-CM | POA: Diagnosis not present

## 2018-04-29 DIAGNOSIS — Z992 Dependence on renal dialysis: Secondary | ICD-10-CM | POA: Diagnosis not present

## 2018-05-01 DIAGNOSIS — D509 Iron deficiency anemia, unspecified: Secondary | ICD-10-CM | POA: Diagnosis not present

## 2018-05-01 DIAGNOSIS — N186 End stage renal disease: Secondary | ICD-10-CM | POA: Diagnosis not present

## 2018-05-01 DIAGNOSIS — N2581 Secondary hyperparathyroidism of renal origin: Secondary | ICD-10-CM | POA: Diagnosis not present

## 2018-05-01 DIAGNOSIS — D631 Anemia in chronic kidney disease: Secondary | ICD-10-CM | POA: Diagnosis not present

## 2018-05-01 DIAGNOSIS — Z992 Dependence on renal dialysis: Secondary | ICD-10-CM | POA: Diagnosis not present

## 2018-05-03 DIAGNOSIS — N186 End stage renal disease: Secondary | ICD-10-CM | POA: Diagnosis not present

## 2018-05-03 DIAGNOSIS — D509 Iron deficiency anemia, unspecified: Secondary | ICD-10-CM | POA: Diagnosis not present

## 2018-05-03 DIAGNOSIS — Z992 Dependence on renal dialysis: Secondary | ICD-10-CM | POA: Diagnosis not present

## 2018-05-03 DIAGNOSIS — N2581 Secondary hyperparathyroidism of renal origin: Secondary | ICD-10-CM | POA: Diagnosis not present

## 2018-05-03 DIAGNOSIS — D631 Anemia in chronic kidney disease: Secondary | ICD-10-CM | POA: Diagnosis not present

## 2018-05-06 DIAGNOSIS — D631 Anemia in chronic kidney disease: Secondary | ICD-10-CM | POA: Diagnosis not present

## 2018-05-06 DIAGNOSIS — N186 End stage renal disease: Secondary | ICD-10-CM | POA: Diagnosis not present

## 2018-05-06 DIAGNOSIS — N2581 Secondary hyperparathyroidism of renal origin: Secondary | ICD-10-CM | POA: Diagnosis not present

## 2018-05-06 DIAGNOSIS — D509 Iron deficiency anemia, unspecified: Secondary | ICD-10-CM | POA: Diagnosis not present

## 2018-05-06 DIAGNOSIS — Z992 Dependence on renal dialysis: Secondary | ICD-10-CM | POA: Diagnosis not present

## 2018-05-08 DIAGNOSIS — N2581 Secondary hyperparathyroidism of renal origin: Secondary | ICD-10-CM | POA: Diagnosis not present

## 2018-05-08 DIAGNOSIS — D631 Anemia in chronic kidney disease: Secondary | ICD-10-CM | POA: Diagnosis not present

## 2018-05-08 DIAGNOSIS — D509 Iron deficiency anemia, unspecified: Secondary | ICD-10-CM | POA: Diagnosis not present

## 2018-05-08 DIAGNOSIS — N186 End stage renal disease: Secondary | ICD-10-CM | POA: Diagnosis not present

## 2018-05-08 DIAGNOSIS — Z992 Dependence on renal dialysis: Secondary | ICD-10-CM | POA: Diagnosis not present

## 2018-05-09 DIAGNOSIS — N186 End stage renal disease: Secondary | ICD-10-CM | POA: Diagnosis not present

## 2018-05-09 DIAGNOSIS — Z992 Dependence on renal dialysis: Secondary | ICD-10-CM | POA: Diagnosis not present

## 2018-05-10 DIAGNOSIS — N186 End stage renal disease: Secondary | ICD-10-CM | POA: Diagnosis not present

## 2018-05-10 DIAGNOSIS — N2581 Secondary hyperparathyroidism of renal origin: Secondary | ICD-10-CM | POA: Diagnosis not present

## 2018-05-10 DIAGNOSIS — D509 Iron deficiency anemia, unspecified: Secondary | ICD-10-CM | POA: Diagnosis not present

## 2018-05-10 DIAGNOSIS — D631 Anemia in chronic kidney disease: Secondary | ICD-10-CM | POA: Diagnosis not present

## 2018-05-10 DIAGNOSIS — Z992 Dependence on renal dialysis: Secondary | ICD-10-CM | POA: Diagnosis not present

## 2018-05-13 DIAGNOSIS — N2581 Secondary hyperparathyroidism of renal origin: Secondary | ICD-10-CM | POA: Diagnosis not present

## 2018-05-13 DIAGNOSIS — D509 Iron deficiency anemia, unspecified: Secondary | ICD-10-CM | POA: Diagnosis not present

## 2018-05-13 DIAGNOSIS — N186 End stage renal disease: Secondary | ICD-10-CM | POA: Diagnosis not present

## 2018-05-13 DIAGNOSIS — D631 Anemia in chronic kidney disease: Secondary | ICD-10-CM | POA: Diagnosis not present

## 2018-05-13 DIAGNOSIS — Z992 Dependence on renal dialysis: Secondary | ICD-10-CM | POA: Diagnosis not present

## 2018-05-15 DIAGNOSIS — D509 Iron deficiency anemia, unspecified: Secondary | ICD-10-CM | POA: Diagnosis not present

## 2018-05-15 DIAGNOSIS — Z992 Dependence on renal dialysis: Secondary | ICD-10-CM | POA: Diagnosis not present

## 2018-05-15 DIAGNOSIS — D631 Anemia in chronic kidney disease: Secondary | ICD-10-CM | POA: Diagnosis not present

## 2018-05-15 DIAGNOSIS — N2581 Secondary hyperparathyroidism of renal origin: Secondary | ICD-10-CM | POA: Diagnosis not present

## 2018-05-15 DIAGNOSIS — N186 End stage renal disease: Secondary | ICD-10-CM | POA: Diagnosis not present

## 2018-05-17 DIAGNOSIS — N2581 Secondary hyperparathyroidism of renal origin: Secondary | ICD-10-CM | POA: Diagnosis not present

## 2018-05-17 DIAGNOSIS — N186 End stage renal disease: Secondary | ICD-10-CM | POA: Diagnosis not present

## 2018-05-17 DIAGNOSIS — D631 Anemia in chronic kidney disease: Secondary | ICD-10-CM | POA: Diagnosis not present

## 2018-05-17 DIAGNOSIS — D509 Iron deficiency anemia, unspecified: Secondary | ICD-10-CM | POA: Diagnosis not present

## 2018-05-17 DIAGNOSIS — Z992 Dependence on renal dialysis: Secondary | ICD-10-CM | POA: Diagnosis not present

## 2018-05-20 DIAGNOSIS — Z992 Dependence on renal dialysis: Secondary | ICD-10-CM | POA: Diagnosis not present

## 2018-05-20 DIAGNOSIS — N186 End stage renal disease: Secondary | ICD-10-CM | POA: Diagnosis not present

## 2018-05-20 DIAGNOSIS — D509 Iron deficiency anemia, unspecified: Secondary | ICD-10-CM | POA: Diagnosis not present

## 2018-05-20 DIAGNOSIS — D631 Anemia in chronic kidney disease: Secondary | ICD-10-CM | POA: Diagnosis not present

## 2018-05-20 DIAGNOSIS — N2581 Secondary hyperparathyroidism of renal origin: Secondary | ICD-10-CM | POA: Diagnosis not present

## 2018-05-22 DIAGNOSIS — D631 Anemia in chronic kidney disease: Secondary | ICD-10-CM | POA: Diagnosis not present

## 2018-05-22 DIAGNOSIS — D509 Iron deficiency anemia, unspecified: Secondary | ICD-10-CM | POA: Diagnosis not present

## 2018-05-22 DIAGNOSIS — N2581 Secondary hyperparathyroidism of renal origin: Secondary | ICD-10-CM | POA: Diagnosis not present

## 2018-05-22 DIAGNOSIS — Z992 Dependence on renal dialysis: Secondary | ICD-10-CM | POA: Diagnosis not present

## 2018-05-22 DIAGNOSIS — N186 End stage renal disease: Secondary | ICD-10-CM | POA: Diagnosis not present

## 2018-05-24 DIAGNOSIS — N2581 Secondary hyperparathyroidism of renal origin: Secondary | ICD-10-CM | POA: Diagnosis not present

## 2018-05-24 DIAGNOSIS — Z992 Dependence on renal dialysis: Secondary | ICD-10-CM | POA: Diagnosis not present

## 2018-05-24 DIAGNOSIS — N186 End stage renal disease: Secondary | ICD-10-CM | POA: Diagnosis not present

## 2018-05-24 DIAGNOSIS — D509 Iron deficiency anemia, unspecified: Secondary | ICD-10-CM | POA: Diagnosis not present

## 2018-05-24 DIAGNOSIS — D631 Anemia in chronic kidney disease: Secondary | ICD-10-CM | POA: Diagnosis not present

## 2018-05-27 DIAGNOSIS — Z992 Dependence on renal dialysis: Secondary | ICD-10-CM | POA: Diagnosis not present

## 2018-05-27 DIAGNOSIS — D631 Anemia in chronic kidney disease: Secondary | ICD-10-CM | POA: Diagnosis not present

## 2018-05-27 DIAGNOSIS — N2581 Secondary hyperparathyroidism of renal origin: Secondary | ICD-10-CM | POA: Diagnosis not present

## 2018-05-27 DIAGNOSIS — N186 End stage renal disease: Secondary | ICD-10-CM | POA: Diagnosis not present

## 2018-05-27 DIAGNOSIS — D509 Iron deficiency anemia, unspecified: Secondary | ICD-10-CM | POA: Diagnosis not present

## 2018-05-29 ENCOUNTER — Encounter (INDEPENDENT_AMBULATORY_CARE_PROVIDER_SITE_OTHER): Payer: Self-pay

## 2018-05-29 DIAGNOSIS — D631 Anemia in chronic kidney disease: Secondary | ICD-10-CM | POA: Diagnosis not present

## 2018-05-29 DIAGNOSIS — N2581 Secondary hyperparathyroidism of renal origin: Secondary | ICD-10-CM | POA: Diagnosis not present

## 2018-05-29 DIAGNOSIS — Z992 Dependence on renal dialysis: Secondary | ICD-10-CM | POA: Diagnosis not present

## 2018-05-29 DIAGNOSIS — D509 Iron deficiency anemia, unspecified: Secondary | ICD-10-CM | POA: Diagnosis not present

## 2018-05-29 DIAGNOSIS — N186 End stage renal disease: Secondary | ICD-10-CM | POA: Diagnosis not present

## 2018-05-31 DIAGNOSIS — Z992 Dependence on renal dialysis: Secondary | ICD-10-CM | POA: Diagnosis not present

## 2018-05-31 DIAGNOSIS — N186 End stage renal disease: Secondary | ICD-10-CM | POA: Diagnosis not present

## 2018-05-31 DIAGNOSIS — N2581 Secondary hyperparathyroidism of renal origin: Secondary | ICD-10-CM | POA: Diagnosis not present

## 2018-05-31 DIAGNOSIS — D509 Iron deficiency anemia, unspecified: Secondary | ICD-10-CM | POA: Diagnosis not present

## 2018-05-31 DIAGNOSIS — D631 Anemia in chronic kidney disease: Secondary | ICD-10-CM | POA: Diagnosis not present

## 2018-06-02 DIAGNOSIS — N186 End stage renal disease: Secondary | ICD-10-CM | POA: Diagnosis not present

## 2018-06-02 DIAGNOSIS — Z992 Dependence on renal dialysis: Secondary | ICD-10-CM | POA: Diagnosis not present

## 2018-06-02 DIAGNOSIS — D509 Iron deficiency anemia, unspecified: Secondary | ICD-10-CM | POA: Diagnosis not present

## 2018-06-02 DIAGNOSIS — D631 Anemia in chronic kidney disease: Secondary | ICD-10-CM | POA: Diagnosis not present

## 2018-06-02 DIAGNOSIS — N2581 Secondary hyperparathyroidism of renal origin: Secondary | ICD-10-CM | POA: Diagnosis not present

## 2018-06-03 ENCOUNTER — Other Ambulatory Visit (INDEPENDENT_AMBULATORY_CARE_PROVIDER_SITE_OTHER): Payer: Self-pay | Admitting: Nurse Practitioner

## 2018-06-03 DIAGNOSIS — Z992 Dependence on renal dialysis: Secondary | ICD-10-CM | POA: Diagnosis not present

## 2018-06-03 DIAGNOSIS — N186 End stage renal disease: Secondary | ICD-10-CM | POA: Diagnosis not present

## 2018-06-03 DIAGNOSIS — D631 Anemia in chronic kidney disease: Secondary | ICD-10-CM | POA: Diagnosis not present

## 2018-06-03 DIAGNOSIS — N2581 Secondary hyperparathyroidism of renal origin: Secondary | ICD-10-CM | POA: Diagnosis not present

## 2018-06-03 DIAGNOSIS — D509 Iron deficiency anemia, unspecified: Secondary | ICD-10-CM | POA: Diagnosis not present

## 2018-06-04 ENCOUNTER — Ambulatory Visit
Admission: RE | Admit: 2018-06-04 | Discharge: 2018-06-04 | Disposition: A | Discharge status: Home / Self Care | Payer: Medicare Other | Source: Ambulatory Visit | Attending: Vascular Surgery | Admitting: Vascular Surgery

## 2018-06-04 ENCOUNTER — Encounter
Admission: RE | Disposition: A | Discharge status: Home / Self Care | Payer: Self-pay | Source: Ambulatory Visit | Attending: Vascular Surgery

## 2018-06-04 DIAGNOSIS — E1122 Type 2 diabetes mellitus with diabetic chronic kidney disease: Secondary | ICD-10-CM | POA: Insufficient documentation

## 2018-06-04 DIAGNOSIS — Z992 Dependence on renal dialysis: Secondary | ICD-10-CM

## 2018-06-04 DIAGNOSIS — T82858A Stenosis of vascular prosthetic devices, implants and grafts, initial encounter: Secondary | ICD-10-CM | POA: Insufficient documentation

## 2018-06-04 DIAGNOSIS — I120 Hypertensive chronic kidney disease with stage 5 chronic kidney disease or end stage renal disease: Secondary | ICD-10-CM | POA: Diagnosis not present

## 2018-06-04 DIAGNOSIS — Z905 Acquired absence of kidney: Secondary | ICD-10-CM | POA: Diagnosis not present

## 2018-06-04 DIAGNOSIS — Z85528 Personal history of other malignant neoplasm of kidney: Secondary | ICD-10-CM | POA: Insufficient documentation

## 2018-06-04 DIAGNOSIS — I2510 Atherosclerotic heart disease of native coronary artery without angina pectoris: Secondary | ICD-10-CM | POA: Diagnosis not present

## 2018-06-04 DIAGNOSIS — Z888 Allergy status to other drugs, medicaments and biological substances status: Secondary | ICD-10-CM | POA: Insufficient documentation

## 2018-06-04 DIAGNOSIS — Z8249 Family history of ischemic heart disease and other diseases of the circulatory system: Secondary | ICD-10-CM | POA: Diagnosis not present

## 2018-06-04 DIAGNOSIS — Y832 Surgical operation with anastomosis, bypass or graft as the cause of abnormal reaction of the patient, or of later complication, without mention of misadventure at the time of the procedure: Secondary | ICD-10-CM | POA: Diagnosis not present

## 2018-06-04 DIAGNOSIS — Z9889 Other specified postprocedural states: Secondary | ICD-10-CM | POA: Diagnosis not present

## 2018-06-04 DIAGNOSIS — N186 End stage renal disease: Secondary | ICD-10-CM | POA: Insufficient documentation

## 2018-06-04 DIAGNOSIS — F17290 Nicotine dependence, other tobacco product, uncomplicated: Secondary | ICD-10-CM | POA: Diagnosis not present

## 2018-06-04 DIAGNOSIS — T82898A Other specified complication of vascular prosthetic devices, implants and grafts, initial encounter: Secondary | ICD-10-CM

## 2018-06-04 HISTORY — PX: A/V FISTULAGRAM: CATH118298

## 2018-06-04 LAB — GLUCOSE, CAPILLARY: Glucose-Capillary: 148 mg/dL — ABNORMAL HIGH (ref 70–99)

## 2018-06-04 LAB — POTASSIUM (ARMC VASCULAR LAB ONLY): Potassium (ARMC vascular lab): 3.7 (ref 3.5–5.1)

## 2018-06-04 SURGERY — A/V FISTULAGRAM
Anesthesia: Moderate Sedation | Laterality: Left

## 2018-06-04 MED ORDER — HEPARIN (PORCINE) IN NACL 1000-0.9 UT/500ML-% IV SOLN
INTRAVENOUS | Status: AC
  Filled 2018-06-04: qty 1000

## 2018-06-04 MED ORDER — LIDOCAINE HCL (PF) 1 % IJ SOLN
INTRAMUSCULAR | Status: AC
  Filled 2018-06-04: qty 30

## 2018-06-04 MED ORDER — FENTANYL CITRATE (PF) 100 MCG/2ML IJ SOLN
INTRAMUSCULAR | Status: DC | PRN
  Administered 2018-06-04: 50 ug via INTRAVENOUS

## 2018-06-04 MED ORDER — SODIUM CHLORIDE 0.9 % IV SOLN
INTRAVENOUS | Status: DC
  Administered 2018-06-04: 10:00:00 via INTRAVENOUS

## 2018-06-04 MED ORDER — FENTANYL CITRATE (PF) 100 MCG/2ML IJ SOLN
INTRAMUSCULAR | Status: AC
  Filled 2018-06-04: qty 2

## 2018-06-04 MED ORDER — HYDROMORPHONE HCL 1 MG/ML IJ SOLN
1.0000 mg | Freq: Once | INTRAMUSCULAR | Status: DC | PRN
Start: 2018-06-04 — End: 2018-06-04

## 2018-06-04 MED ORDER — MIDAZOLAM HCL 5 MG/5ML IJ SOLN
INTRAMUSCULAR | Status: AC
  Filled 2018-06-04: qty 5

## 2018-06-04 MED ORDER — ONDANSETRON HCL 4 MG/2ML IJ SOLN: 4.0000 mg | Freq: Four times a day (QID) | INTRAMUSCULAR | Status: DC | PRN

## 2018-06-04 MED ORDER — IOPAMIDOL (ISOVUE-300) INJECTION 61%
INTRAVENOUS | Status: DC | PRN
  Administered 2018-06-04: 20 mL via INTRAVENOUS

## 2018-06-04 MED ORDER — HEPARIN SODIUM (PORCINE) 1000 UNIT/ML IJ SOLN
INTRAMUSCULAR | Status: DC | PRN
  Administered 2018-06-04: 3000 [IU] via INTRAVENOUS

## 2018-06-04 MED ORDER — HEPARIN SODIUM (PORCINE) 1000 UNIT/ML IJ SOLN
INTRAMUSCULAR | Status: AC
  Filled 2018-06-04: qty 1

## 2018-06-04 MED ORDER — MIDAZOLAM HCL 2 MG/2ML IJ SOLN
INTRAMUSCULAR | Status: DC | PRN
  Administered 2018-06-04: 2 mg via INTRAVENOUS

## 2018-06-04 MED ORDER — CEFAZOLIN SODIUM-DEXTROSE 1-4 GM/50ML-% IV SOLN
1.0000 g | Freq: Once | INTRAVENOUS | Status: AC
  Administered 2018-06-04: 1 g via INTRAVENOUS

## 2018-06-04 SURGICAL SUPPLY — 15 items
BALLN DORADO 8X40X80 (BALLOONS) ×3
BALLN DORADO 9X20X80 (BALLOONS) ×3
BALLOON DORADO 8X40X80 (BALLOONS) ×1 IMPLANT
BALLOON DORADO 9X20X80 (BALLOONS) ×1 IMPLANT
DEVICE PRESTO INFLATION (MISCELLANEOUS) ×3 IMPLANT
DRAPE BRACHIAL (DRAPES) ×3 IMPLANT
NEEDLE ENTRY 21GA 7CM ECHOTIP (NEEDLE) ×6 IMPLANT
PACK ANGIOGRAPHY (CUSTOM PROCEDURE TRAY) ×3 IMPLANT
SET INTRO CAPELLA COAXIAL (SET/KITS/TRAYS/PACK) ×3 IMPLANT
SHEATH BRITE TIP 6FRX5.5 (SHEATH) ×3 IMPLANT
SHEATH BRITE TIP 7FRX5.5 (SHEATH) ×3 IMPLANT
STENT VIABAHN 8X50X120 (Permanent Stent) ×3 IMPLANT
STENT VIABAHN5X120X8X (Permanent Stent) ×1 IMPLANT
SUT MNCRL AB 4-0 PS2 18 (SUTURE) ×3 IMPLANT
WIRE G 018X200 V18 (WIRE) ×3 IMPLANT

## 2018-06-04 NOTE — H&P (Signed)
[facility] Admission History & Physical  MRN : [id number]  Timothy Gibson is a 67 y.o. (1951/04/06) male who presents with chief complaint of No chief complaint on file. Marland Kitchen  History of Present Illness: I am asked to evaluate the patient by the dialysis center. The patient was sent here because they were unable to achieve adequate dialysis this morning. Furthermore the Center states there is very poor thrill and bruit. The patient states there there have been increasing problems with the access, such as "pulling clots" during dialysis and prolonged bleeding after decannulation. The patient estimates these problems have been going on for several weeks. The patient is unaware of any other change.  Patient denies pain or tenderness overlying the access.  There is no pain with dialysis.  The patient denies hand pain or finger pain consistent with steal syndrome.   There have not  any past interventions or declots of this access.  The patient is not chronically hypotensive on dialysis.  No current facility-administered medications for this encounter.     Past Medical History:  Diagnosis Date  . Cancer ([place])    right kidney  . Diabetes mellitus without complication ([place])   . Dialysis patient [facility])    on Mon-Wed-Fri dialysis  . ESRD (end stage renal disease) ([place])   . History of gangrene   . Hypertension     Past Surgical History:  Procedure Laterality Date  . COLONOSCOPY WITH PROPOFOL N/A 12/16/2015   Procedure: COLONOSCOPY WITH PROPOFOL;  Surgeon: Manya Silvas, MD;  Location: [facility] ENDOSCOPY;  Service: Endoscopy;  Laterality: N/A;  . KNEE SURGERY    . left forearm AV fistula    . NEPHRECTOMY      Social History Social History   Tobacco Use  . Smoking status: Former Research scientist (life sciences)  . Smokeless tobacco: Never Used  . Tobacco comment: cigar smoker  Substance Use Topics  . Alcohol use: No  . Drug use: No    Family History Family History  Problem Relation  Age of Onset  . Hypertension Father     No family history of bleeding or clotting disorders, autoimmune disease or porphyria  Allergies  Allergen Reactions  . Adhesive [Tape]     Pulls skin off.  Must use paper tape     REVIEW OF SYSTEMS (Negative unless checked)  Constitutional: [] Weight loss  [] Fever  [] Chills Cardiac: [] Chest pain   [] Chest pressure   [] Palpitations   [] Shortness of breath when laying flat   [] Shortness of breath at rest   [x] Shortness of breath with exertion. Vascular:  [] Pain in legs with walking   [] Pain in legs at rest   [] Pain in legs when laying flat   [] Claudication   [] Pain in feet when walking  [] Pain in feet at rest  [] Pain in feet when laying flat   [] History of DVT   [] Phlebitis   [] Swelling in legs   [] Varicose veins   [] Non-healing ulcers Pulmonary:   [] Uses home oxygen   [] Productive cough   [] Hemoptysis   [] Wheeze  [] COPD   [] Asthma Neurologic:  [] Dizziness  [] Blackouts   [] Seizures   [] History of stroke   [] History of TIA  [] Aphasia   [] Temporary blindness   [] Dysphagia   [] Weakness or numbness in arms   [] Weakness or numbness in legs Musculoskeletal:  [] Arthritis   [] Joint swelling   [] Joint pain   [] Low back pain Hematologic:  [] Easy bruising  [] Easy bleeding   [] Hypercoagulable state   []   Anemic  [] Hepatitis Gastrointestinal:  [] Blood in stool   [] Vomiting blood  [] Gastroesophageal reflux/heartburn   [] Difficulty swallowing. Genitourinary:  [x] Chronic kidney disease   [] Difficult urination  [] Frequent urination  [] Burning with urination   [] Blood in urine Skin:  [] Rashes   [] Ulcers   [] Wounds Psychological:  [] History of anxiety   []  History of major depression.  Physical Examination  There were no vitals filed for this visit. There is no height or weight on file to calculate BMI. Gen: WD/WN, NAD Head: [place]/AT, No temporalis wasting. Prominent temp pulse not noted. Ear/Nose/Throat: Hearing grossly intact, nares w/o erythema or drainage, oropharynx  w/o Erythema/Exudate,  Eyes: Conjunctiva clear, sclera non-icteric Neck: Trachea midline.  No JVD.  Pulmonary:  Good air movement, respirations not labored, no use of accessory muscles.  Cardiac: RRR, normal S1, S2. Vascular: left brachial cephalic fistula pulsatile poor thrill Vessel Right Left  Radial Palpable Palpable  Ulnar Not Palpable Not Palpable  Brachial Palpable Palpable  Carotid Palpable, without bruit Palpable, without bruit  Gastrointestinal: soft, non-tender/non-distended. No guarding/reflex.  Musculoskeletal: M/S 5/5 throughout.  Extremities without ischemic changes.  No deformity or atrophy.  Neurologic: Sensation grossly intact in extremities.  Symmetrical.  Speech is fluent. Motor exam as listed above. Psychiatric: Judgment intact, Mood & affect appropriate for pt's clinical situation. Dermatologic: No rashes or ulcers noted.  No cellulitis or open wounds. Lymph : No Cervical, Axillary, or Inguinal lymphadenopathy.   CBC Lab Results  Component Value Date   WBC 2.8 (L) 05/23/2017   HGB 10.6 (L) 05/23/2017   HCT 33.0 (L) 05/23/2017   MCV 92.3 05/23/2017   PLT 130 (L) 05/23/2017    BMET    Component Value Date/Time   NA 136 05/23/2017 0008   NA 138 08/13/2011 1058   K 4.1 05/23/2017 0008   K 4.3 08/13/2011 1058   CL 95 (L) 05/23/2017 0008   CL 96 (L) 08/13/2011 1058   CO2 26 05/23/2017 0008   CO2 29 08/13/2011 1058   GLUCOSE 308 (H) 05/23/2017 0008   GLUCOSE 294 (H) 08/13/2011 1058   BUN 43 (H) 05/23/2017 0008   BUN 49 (H) 08/13/2011 1058   CREATININE 10.13 (H) 05/23/2017 0008   CREATININE 8.72 (H) 08/13/2011 1058   CALCIUM 8.6 (L) 05/23/2017 0008   CALCIUM 9.0 08/13/2011 1058   GFRNONAA 5 (L) 05/23/2017 0008   GFRNONAA 7 (L) 08/13/2011 1058   GFRAA 5 (L) 05/23/2017 0008   GFRAA 8 (L) 08/13/2011 1058   CrCl cannot be calculated (Patient's most recent lab result is older than the maximum 21 days allowed.).  COAG Lab Results  Component Value Date    INR 1.43 03/20/2017    Radiology No results found.  Assessment/Plan 1.  Complication dialysis device with thrombosis AV access:  Patient's left arm fistula dialysis access is malfunctioning. The patient will undergo angiography and correction of any problems using interventional techniques with the hope of restoring function to the access.  The risks and benefits were described to the patient.  All questions were answered.  The patient agrees to proceed with angiography and intervention. Potassium will be drawn to ensure that it is an appropriate level prior to performing intervention. 2.  End-stage renal disease requiring hemodialysis:  Patient will continue dialysis therapy without further interruption if a successful intervention is not achieved then a tunneled catheter will be placed. Dialysis has already been arranged. 3.  Hypertension:  Patient will continue medical management; nephrology is following no changes in oral  medications. 4. Diabetes mellitus:  Glucose will be monitored and oral medications been held this morning once the patient has undergone the patient's procedure po intake will be reinitiated and again Accu-Cheks will be used to assess the blood glucose level and treat as needed. The patient will be restarted on the patient's usual hypoglycemic regime 5.  Coronary artery disease:  EKG will be monitored. Nitrates will be used if needed. The patient's oral cardiac medications will be continued.    Hortencia Pilar, MD  06/04/2018 8:46 AM

## 2018-06-04 NOTE — Op Note (Signed)
OPERATIVE NOTE   PROCEDURE: 1. Contrast injection left radiocephalic AV access 2. Percutaneous transluminal angioplasty and stent placement venous outflow left radiocephalic fistula  PRE-OPERATIVE DIAGNOSIS: Complication of dialysis access                                                       End Stage Renal Disease  POST-OPERATIVE DIAGNOSIS: same as above   SURGEON: Katha Cabal, M.D.  ANESTHESIA: Conscious sedation was administered under my direct supervision by the interventional radiology RN. IV Versed plus fentanyl were utilized. Continuous ECG, pulse oximetry and blood pressure was monitored throughout the entire procedure.  Conscious sedation was for a total of 26 minutes.  ESTIMATED BLOOD LOSS: minimal  FINDING(S): 1. Stricture in the cephalic vein at the level of the proximal forearm  SPECIMEN(S):  None  CONTRAST: 20 cc  FLUOROSCOPY TIME: 1.4 minutes  INDICATIONS: Timothy Gibson is a 67 y.o. male who  presents with malfunctioning left wrist AV access.  The patient is scheduled for angiography with possible intervention of the AV access.  The patient is aware the risks include but are not limited to: bleeding, infection, thrombosis of the cannulated access, and possible anaphylactic reaction to the contrast.  The patient acknowledges if the access can not be salvaged a tunneled catheter will be needed and will be placed during this procedure.  The patient is aware of the risks of the procedure and elects to proceed with the angiogram and intervention.  DESCRIPTION: After full informed written consent was obtained, the patient was brought back to the Special Procedure suite and placed supine position.  Appropriate cardiopulmonary monitors were placed.  The left arm was prepped and draped in the standard fashion.  Appropriate timeout is called. The left radiocephalic fistula was cannulated with a micropuncture needle.  Cannulation was performed with ultrasound guidance.  Ultrasound was placed in a sterile sleeve, the AV access was interrogated and noted to be echolucent and compressible indicating patency. Image was recorded for the permanent record. The puncture is performed under continuous ultrasound visualization.   The microwire was advanced and the needle was exchanged for  a microsheath.  The J-wire was then advanced and a 6 Fr sheath inserted.  Hand injections were completed to image the access from the arterial anastomosis through the entire access.  The central venous structures were also imaged by hand injections.  The radiocephalic fistula is patent.  The area that has been cannulated is mildly dilated.  In the proximal forearm there is an 80% stricture at the venous outflow.  At the level antecubital fossa the cephalic vein is widely patent and remains widely patent through the entire upper arm to the cephalic subclavian confluence.  Subclavian vein is widely patent as is central veins.  Arterial anastomosis is widely patent.  Based on the images,  3000 units of heparin was given and a wire was negotiated through the strictures within the venous portion of the graft.  An 8 mm x 50 mm Viabahn was deployed across the stenoses and postdilated with an 8 mm Dorado balloon and then a 9 mm Dorado balloon.  Inflations were to 26 atm.  Follow-up imaging demonstrates less than 5% residual stenosis with rapid flow of contrast through the stent.  Follow-up imaging demonstrates complete resolution of the stricture with rapid flow  of contrast through the graft, the central venous anatomy is preserved.  A 4-0 Monocryl purse-string suture was sewn around the sheath.  The sheath was removed and light pressure was applied.  A sterile bandage was applied to the puncture site.    COMPLICATIONS: None  CONDITION: Carlynn Purl, M.D [facility] Office: [phone]  06/04/2018 11:58 AM

## 2018-06-07 DIAGNOSIS — D509 Iron deficiency anemia, unspecified: Secondary | ICD-10-CM | POA: Diagnosis not present

## 2018-06-07 DIAGNOSIS — D631 Anemia in chronic kidney disease: Secondary | ICD-10-CM | POA: Diagnosis not present

## 2018-06-07 DIAGNOSIS — Z992 Dependence on renal dialysis: Secondary | ICD-10-CM | POA: Diagnosis not present

## 2018-06-07 DIAGNOSIS — N186 End stage renal disease: Secondary | ICD-10-CM | POA: Diagnosis not present

## 2018-06-07 DIAGNOSIS — N2581 Secondary hyperparathyroidism of renal origin: Secondary | ICD-10-CM | POA: Diagnosis not present

## 2018-06-08 DIAGNOSIS — N186 End stage renal disease: Secondary | ICD-10-CM | POA: Diagnosis not present

## 2018-06-08 DIAGNOSIS — Z992 Dependence on renal dialysis: Secondary | ICD-10-CM | POA: Diagnosis not present

## 2018-06-10 DIAGNOSIS — D509 Iron deficiency anemia, unspecified: Secondary | ICD-10-CM | POA: Diagnosis not present

## 2018-06-10 DIAGNOSIS — N186 End stage renal disease: Secondary | ICD-10-CM | POA: Diagnosis not present

## 2018-06-10 DIAGNOSIS — Z992 Dependence on renal dialysis: Secondary | ICD-10-CM | POA: Diagnosis not present

## 2018-06-10 DIAGNOSIS — N2581 Secondary hyperparathyroidism of renal origin: Secondary | ICD-10-CM | POA: Diagnosis not present

## 2018-06-10 DIAGNOSIS — D631 Anemia in chronic kidney disease: Secondary | ICD-10-CM | POA: Diagnosis not present

## 2018-06-12 DIAGNOSIS — D509 Iron deficiency anemia, unspecified: Secondary | ICD-10-CM | POA: Diagnosis not present

## 2018-06-12 DIAGNOSIS — N186 End stage renal disease: Secondary | ICD-10-CM | POA: Diagnosis not present

## 2018-06-12 DIAGNOSIS — N2581 Secondary hyperparathyroidism of renal origin: Secondary | ICD-10-CM | POA: Diagnosis not present

## 2018-06-12 DIAGNOSIS — D631 Anemia in chronic kidney disease: Secondary | ICD-10-CM | POA: Diagnosis not present

## 2018-06-12 DIAGNOSIS — Z992 Dependence on renal dialysis: Secondary | ICD-10-CM | POA: Diagnosis not present

## 2018-06-14 DIAGNOSIS — N186 End stage renal disease: Secondary | ICD-10-CM | POA: Diagnosis not present

## 2018-06-14 DIAGNOSIS — Z992 Dependence on renal dialysis: Secondary | ICD-10-CM | POA: Diagnosis not present

## 2018-06-14 DIAGNOSIS — D509 Iron deficiency anemia, unspecified: Secondary | ICD-10-CM | POA: Diagnosis not present

## 2018-06-14 DIAGNOSIS — N2581 Secondary hyperparathyroidism of renal origin: Secondary | ICD-10-CM | POA: Diagnosis not present

## 2018-06-14 DIAGNOSIS — D631 Anemia in chronic kidney disease: Secondary | ICD-10-CM | POA: Diagnosis not present

## 2018-06-17 DIAGNOSIS — N186 End stage renal disease: Secondary | ICD-10-CM | POA: Diagnosis not present

## 2018-06-17 DIAGNOSIS — D509 Iron deficiency anemia, unspecified: Secondary | ICD-10-CM | POA: Diagnosis not present

## 2018-06-17 DIAGNOSIS — N2581 Secondary hyperparathyroidism of renal origin: Secondary | ICD-10-CM | POA: Diagnosis not present

## 2018-06-17 DIAGNOSIS — D631 Anemia in chronic kidney disease: Secondary | ICD-10-CM | POA: Diagnosis not present

## 2018-06-17 DIAGNOSIS — Z992 Dependence on renal dialysis: Secondary | ICD-10-CM | POA: Diagnosis not present

## 2018-06-19 DIAGNOSIS — D509 Iron deficiency anemia, unspecified: Secondary | ICD-10-CM | POA: Diagnosis not present

## 2018-06-19 DIAGNOSIS — N186 End stage renal disease: Secondary | ICD-10-CM | POA: Diagnosis not present

## 2018-06-19 DIAGNOSIS — D631 Anemia in chronic kidney disease: Secondary | ICD-10-CM | POA: Diagnosis not present

## 2018-06-19 DIAGNOSIS — N2581 Secondary hyperparathyroidism of renal origin: Secondary | ICD-10-CM | POA: Diagnosis not present

## 2018-06-19 DIAGNOSIS — Z992 Dependence on renal dialysis: Secondary | ICD-10-CM | POA: Diagnosis not present

## 2018-06-21 DIAGNOSIS — Z992 Dependence on renal dialysis: Secondary | ICD-10-CM | POA: Diagnosis not present

## 2018-06-21 DIAGNOSIS — D631 Anemia in chronic kidney disease: Secondary | ICD-10-CM | POA: Diagnosis not present

## 2018-06-21 DIAGNOSIS — N2581 Secondary hyperparathyroidism of renal origin: Secondary | ICD-10-CM | POA: Diagnosis not present

## 2018-06-21 DIAGNOSIS — N186 End stage renal disease: Secondary | ICD-10-CM | POA: Diagnosis not present

## 2018-06-21 DIAGNOSIS — D509 Iron deficiency anemia, unspecified: Secondary | ICD-10-CM | POA: Diagnosis not present

## 2018-06-24 ENCOUNTER — Other Ambulatory Visit (INDEPENDENT_AMBULATORY_CARE_PROVIDER_SITE_OTHER): Payer: Self-pay | Admitting: Vascular Surgery

## 2018-06-24 DIAGNOSIS — N2581 Secondary hyperparathyroidism of renal origin: Secondary | ICD-10-CM | POA: Diagnosis not present

## 2018-06-24 DIAGNOSIS — Z992 Dependence on renal dialysis: Secondary | ICD-10-CM | POA: Diagnosis not present

## 2018-06-24 DIAGNOSIS — N186 End stage renal disease: Secondary | ICD-10-CM

## 2018-06-24 DIAGNOSIS — D509 Iron deficiency anemia, unspecified: Secondary | ICD-10-CM | POA: Diagnosis not present

## 2018-06-24 DIAGNOSIS — D631 Anemia in chronic kidney disease: Secondary | ICD-10-CM | POA: Diagnosis not present

## 2018-06-26 DIAGNOSIS — N2581 Secondary hyperparathyroidism of renal origin: Secondary | ICD-10-CM | POA: Diagnosis not present

## 2018-06-26 DIAGNOSIS — Z992 Dependence on renal dialysis: Secondary | ICD-10-CM | POA: Diagnosis not present

## 2018-06-26 DIAGNOSIS — D509 Iron deficiency anemia, unspecified: Secondary | ICD-10-CM | POA: Diagnosis not present

## 2018-06-26 DIAGNOSIS — D631 Anemia in chronic kidney disease: Secondary | ICD-10-CM | POA: Diagnosis not present

## 2018-06-26 DIAGNOSIS — N186 End stage renal disease: Secondary | ICD-10-CM | POA: Diagnosis not present

## 2018-06-27 ENCOUNTER — Ambulatory Visit (INDEPENDENT_AMBULATORY_CARE_PROVIDER_SITE_OTHER): Payer: Medicare Other

## 2018-06-27 ENCOUNTER — Encounter (INDEPENDENT_AMBULATORY_CARE_PROVIDER_SITE_OTHER): Payer: Self-pay | Admitting: Vascular Surgery

## 2018-06-27 ENCOUNTER — Ambulatory Visit (INDEPENDENT_AMBULATORY_CARE_PROVIDER_SITE_OTHER): Payer: Medicare Other | Admitting: Vascular Surgery

## 2018-06-27 VITALS — BP 191/84 | HR 90 | Resp 14 | Ht 76.5 in | Wt 245.6 lb

## 2018-06-27 DIAGNOSIS — T829XXD Unspecified complication of cardiac and vascular prosthetic device, implant and graft, subsequent encounter: Secondary | ICD-10-CM | POA: Diagnosis not present

## 2018-06-27 DIAGNOSIS — J449 Chronic obstructive pulmonary disease, unspecified: Secondary | ICD-10-CM | POA: Diagnosis not present

## 2018-06-27 DIAGNOSIS — Z87891 Personal history of nicotine dependence: Secondary | ICD-10-CM

## 2018-06-27 DIAGNOSIS — N186 End stage renal disease: Secondary | ICD-10-CM | POA: Diagnosis not present

## 2018-06-27 DIAGNOSIS — I70213 Atherosclerosis of native arteries of extremities with intermittent claudication, bilateral legs: Secondary | ICD-10-CM

## 2018-06-27 DIAGNOSIS — I120 Hypertensive chronic kidney disease with stage 5 chronic kidney disease or end stage renal disease: Secondary | ICD-10-CM | POA: Diagnosis not present

## 2018-06-27 DIAGNOSIS — T829XXA Unspecified complication of cardiac and vascular prosthetic device, implant and graft, initial encounter: Secondary | ICD-10-CM | POA: Insufficient documentation

## 2018-06-27 DIAGNOSIS — I10 Essential (primary) hypertension: Secondary | ICD-10-CM

## 2018-06-27 DIAGNOSIS — T829XXS Unspecified complication of cardiac and vascular prosthetic device, implant and graft, sequela: Secondary | ICD-10-CM

## 2018-06-27 NOTE — Progress Notes (Signed)
MRN : [id number]  Timothy Gibson is a 67 y.o. ([birth year]) male who presents with chief complaint of fistula follow up.  History of Present Illness:   The patient returns to the office for followup status post intervention of the dialysis access 06/04/2018.   Procedure performedpercutaneous transluminal angioplasty and stent placement venous outflow left radiocephalic fistula  Following the intervention the access function has significantly improved, with better flow rates and improved KT/V. The patient has not been experiencing increased bleeding times following decannulation and the patient denies increased recirculation. The patient denies an increase in arm swelling. At the present time the patient denies hand pain.  The patient denies amaurosis fugax or recent TIA symptoms. There are no recent neurological changes noted. The patient denies claudication symptoms or rest pain symptoms. The patient denies history of DVT, PE or superficial thrombophlebitis. The patient denies recent episodes of angina or shortness of breath.   Duplex ultrasound of the AV access shows a patent access with uniform velocities, stent is widely patent.  Volume flow is 1300.  No focal hemodynamically significant stenosis.     No outpatient medications have been marked as taking for the 06/27/18 encounter (Appointment) with Delana Meyer, Dolores Lory, MD.    Past Medical History:  Diagnosis Date  . Cancer ([place])    right kidney  . Diabetes mellitus without complication ([place])   . Dialysis patient [facility])    on Mon-Wed-Fri dialysis  . ESRD (end stage renal disease) ([place])   . History of gangrene   . Hypertension     Past Surgical History:  Procedure Laterality Date  . A/V FISTULAGRAM Left 06/04/2018   Procedure: A/V FISTULAGRAM;  Surgeon: Katha Cabal, MD;  Location: [facility];  Service: Cardiovascular;  Laterality: Left;  . COLONOSCOPY WITH PROPOFOL N/A 12/16/2015   Procedure: COLONOSCOPY  WITH PROPOFOL;  Surgeon: Manya Silvas, MD;  Location: [facility] ENDOSCOPY;  Service: Endoscopy;  Laterality: N/A;  . KNEE SURGERY    . left forearm AV fistula    . NEPHRECTOMY      Social History Social History   Tobacco Use  . Smoking status: Former Research scientist (life sciences)  . Smokeless tobacco: Never Used  . Tobacco comment: cigar smoker  Substance Use Topics  . Alcohol use: No  . Drug use: No    Family History Family History  Problem Relation Age of Onset  . Hypertension Father     Allergies  Allergen Reactions  . Adhesive [Tape]     Pulls skin off.  Must use paper tape     REVIEW OF SYSTEMS (Negative unless checked)  Constitutional: [] Weight loss  [] Fever  [] Chills Cardiac: [] Chest pain   [] Chest pressure   [] Palpitations   [] Shortness of breath when laying flat   [] Shortness of breath with exertion. Vascular:  [] Pain in legs with walking   [] Pain in legs at rest  [] History of DVT   [] Phlebitis   [] Swelling in legs   [] Varicose veins   [] Non-healing ulcers Pulmonary:   [] Uses home oxygen   [] Productive cough   [] Hemoptysis   [] Wheeze  [] COPD   [] Asthma Neurologic:  [] Dizziness   [] Seizures   [] History of stroke   [] History of TIA  [] Aphasia   [] Vissual changes   [] Weakness or numbness in arm   [] Weakness or numbness in leg Musculoskeletal:   [] Joint swelling   [] Joint pain   [] Low back pain Hematologic:  [] Easy bruising  [] Easy bleeding   [] Hypercoagulable state   []   Anemic Gastrointestinal:  [] Diarrhea   [] Vomiting  [] Gastroesophageal reflux/heartburn   [] Difficulty swallowing. Genitourinary:  [x] Chronic kidney disease   [] Difficult urination  [] Frequent urination   [] Blood in urine Skin:  [] Rashes   [] Ulcers  Psychological:  [] History of anxiety   []  History of major depression.  Physical Examination  There were no vitals filed for this visit. There is no height or weight on file to calculate BMI. Gen: WD/WN, NAD Head: [place]/AT, No temporalis wasting.  Ear/Nose/Throat: Hearing  grossly intact, nares w/o erythema or drainage Eyes: PER, EOMI, sclera nonicteric.  Neck: Supple, no large masses.   Pulmonary:  Good air movement, no audible wheezing bilaterally, no use of accessory muscles.  Cardiac: RRR, no JVD Vascular: left radial cephalic fistula mild aneurysm no infection good thrill good bruit Vessel Right Left  Radial Palpable Palpable  Ulnar Palpable Palpable  Brachial Palpable Palpable  Gastrointestinal: Non-distended. No guarding/no peritoneal signs.  Musculoskeletal: M/S 5/5 throughout.  No deformity or atrophy.  Neurologic: CN 2-12 intact. Symmetrical.  Speech is fluent. Motor exam as listed above. Psychiatric: Judgment intact, Mood & affect appropriate for pt's clinical situation. Dermatologic: No rashes or ulcers noted.  No changes consistent with cellulitis. Lymph : No lichenification or skin changes of chronic lymphedema.  CBC Lab Results  Component Value Date   WBC 2.8 (L) 05/23/2017   HGB 10.6 (L) 05/23/2017   HCT 33.0 (L) 05/23/2017   MCV 92.3 05/23/2017   PLT 130 (L) 05/23/2017    BMET    Component Value Date/Time   NA 136 05/23/2017 0008   NA 138 08/13/2011 1058   K 4.1 05/23/2017 0008   K 4.3 08/13/2011 1058   CL 95 (L) 05/23/2017 0008   CL 96 (L) 08/13/2011 1058   CO2 26 05/23/2017 0008   CO2 29 08/13/2011 1058   GLUCOSE 308 (H) 05/23/2017 0008   GLUCOSE 294 (H) 08/13/2011 1058   BUN 43 (H) 05/23/2017 0008   BUN 49 (H) 08/13/2011 1058   CREATININE 10.13 (H) 05/23/2017 0008   CREATININE 8.72 (H) 08/13/2011 1058   CALCIUM 8.6 (L) 05/23/2017 0008   CALCIUM 9.0 08/13/2011 1058   GFRNONAA 5 (L) 05/23/2017 0008   GFRNONAA 7 (L) 08/13/2011 1058   GFRAA 5 (L) 05/23/2017 0008   GFRAA 8 (L) 08/13/2011 1058   CrCl cannot be calculated (Patient's most recent lab result is older than the maximum 21 days allowed.).  COAG Lab Results  Component Value Date   INR 1.43 03/20/2017    Radiology No results  found.   Assessment/Plan 1. Complication from renal dialysis device, sequela Recommend:  The patient is doing well and currently has adequate dialysis access. The patient's dialysis center is not reporting any access issues. Flow pattern is stable when compared to the prior ultrasound.  The patient should have a duplex ultrasound of the dialysis access in 6 months. The patient will follow-up with me in the office after each ultrasound   - VAS [place] (AVF, AVG); Future  2. End stage renal disease ([place]) Continue without interuption  3. Atherosclerosis of native artery of both lower extremities with intermittent claudication (HCC)  Recommend:  The patient has evidence of atherosclerosis of the lower extremities with claudication.  The patient does not voice lifestyle limiting changes at this point in time.  Noninvasive studies do not suggest clinically significant change.  No invasive studies, angiography or surgery at this time The patient should continue walking and begin a more formal  exercise program.  The patient should continue antiplatelet therapy and aggressive treatment of the lipid abnormalities  No changes in the patient's medications at this time  The patient should continue wearing graduated compression socks 10-15 mmHg strength to control the mild edema.    4. Chronic obstructive pulmonary disease, unspecified COPD type ([place]) Continue pulmonary medications and aerosols as already ordered, these medications have been reviewed and there are no changes at this time.    5. Essential hypertension Continue antihypertensive medications as already ordered, these medications have been reviewed and there are no changes at this time.    Hortencia Pilar, MD  06/27/2018 8:19 AM

## 2018-06-28 DIAGNOSIS — D509 Iron deficiency anemia, unspecified: Secondary | ICD-10-CM | POA: Diagnosis not present

## 2018-06-28 DIAGNOSIS — D631 Anemia in chronic kidney disease: Secondary | ICD-10-CM | POA: Diagnosis not present

## 2018-06-28 DIAGNOSIS — Z992 Dependence on renal dialysis: Secondary | ICD-10-CM | POA: Diagnosis not present

## 2018-06-28 DIAGNOSIS — N2581 Secondary hyperparathyroidism of renal origin: Secondary | ICD-10-CM | POA: Diagnosis not present

## 2018-06-28 DIAGNOSIS — N186 End stage renal disease: Secondary | ICD-10-CM | POA: Diagnosis not present

## 2018-06-30 DIAGNOSIS — Z992 Dependence on renal dialysis: Secondary | ICD-10-CM | POA: Diagnosis not present

## 2018-06-30 DIAGNOSIS — D631 Anemia in chronic kidney disease: Secondary | ICD-10-CM | POA: Diagnosis not present

## 2018-06-30 DIAGNOSIS — N2581 Secondary hyperparathyroidism of renal origin: Secondary | ICD-10-CM | POA: Diagnosis not present

## 2018-06-30 DIAGNOSIS — D509 Iron deficiency anemia, unspecified: Secondary | ICD-10-CM | POA: Diagnosis not present

## 2018-06-30 DIAGNOSIS — N186 End stage renal disease: Secondary | ICD-10-CM | POA: Diagnosis not present

## 2018-07-02 DIAGNOSIS — Z992 Dependence on renal dialysis: Secondary | ICD-10-CM | POA: Diagnosis not present

## 2018-07-02 DIAGNOSIS — N186 End stage renal disease: Secondary | ICD-10-CM | POA: Diagnosis not present

## 2018-07-02 DIAGNOSIS — D509 Iron deficiency anemia, unspecified: Secondary | ICD-10-CM | POA: Diagnosis not present

## 2018-07-02 DIAGNOSIS — D631 Anemia in chronic kidney disease: Secondary | ICD-10-CM | POA: Diagnosis not present

## 2018-07-02 DIAGNOSIS — N2581 Secondary hyperparathyroidism of renal origin: Secondary | ICD-10-CM | POA: Diagnosis not present

## 2018-07-05 DIAGNOSIS — N2581 Secondary hyperparathyroidism of renal origin: Secondary | ICD-10-CM | POA: Diagnosis not present

## 2018-07-05 DIAGNOSIS — Z992 Dependence on renal dialysis: Secondary | ICD-10-CM | POA: Diagnosis not present

## 2018-07-05 DIAGNOSIS — N186 End stage renal disease: Secondary | ICD-10-CM | POA: Diagnosis not present

## 2018-07-05 DIAGNOSIS — D631 Anemia in chronic kidney disease: Secondary | ICD-10-CM | POA: Diagnosis not present

## 2018-07-05 DIAGNOSIS — D509 Iron deficiency anemia, unspecified: Secondary | ICD-10-CM | POA: Diagnosis not present

## 2018-07-08 DIAGNOSIS — N2581 Secondary hyperparathyroidism of renal origin: Secondary | ICD-10-CM | POA: Diagnosis not present

## 2018-07-08 DIAGNOSIS — Z992 Dependence on renal dialysis: Secondary | ICD-10-CM | POA: Diagnosis not present

## 2018-07-08 DIAGNOSIS — N186 End stage renal disease: Secondary | ICD-10-CM | POA: Diagnosis not present

## 2018-07-08 DIAGNOSIS — D509 Iron deficiency anemia, unspecified: Secondary | ICD-10-CM | POA: Diagnosis not present

## 2018-07-08 DIAGNOSIS — D631 Anemia in chronic kidney disease: Secondary | ICD-10-CM | POA: Diagnosis not present

## 2018-07-09 DIAGNOSIS — Z992 Dependence on renal dialysis: Secondary | ICD-10-CM | POA: Diagnosis not present

## 2018-07-09 DIAGNOSIS — N186 End stage renal disease: Secondary | ICD-10-CM | POA: Diagnosis not present

## 2018-07-10 DIAGNOSIS — N186 End stage renal disease: Secondary | ICD-10-CM | POA: Diagnosis not present

## 2018-07-10 DIAGNOSIS — N2581 Secondary hyperparathyroidism of renal origin: Secondary | ICD-10-CM | POA: Diagnosis not present

## 2018-07-10 DIAGNOSIS — Z992 Dependence on renal dialysis: Secondary | ICD-10-CM | POA: Diagnosis not present

## 2018-07-10 DIAGNOSIS — D509 Iron deficiency anemia, unspecified: Secondary | ICD-10-CM | POA: Diagnosis not present

## 2018-07-10 DIAGNOSIS — D631 Anemia in chronic kidney disease: Secondary | ICD-10-CM | POA: Diagnosis not present

## 2018-07-12 DIAGNOSIS — D631 Anemia in chronic kidney disease: Secondary | ICD-10-CM | POA: Diagnosis not present

## 2018-07-12 DIAGNOSIS — Z992 Dependence on renal dialysis: Secondary | ICD-10-CM | POA: Diagnosis not present

## 2018-07-12 DIAGNOSIS — D509 Iron deficiency anemia, unspecified: Secondary | ICD-10-CM | POA: Diagnosis not present

## 2018-07-12 DIAGNOSIS — N186 End stage renal disease: Secondary | ICD-10-CM | POA: Diagnosis not present

## 2018-07-12 DIAGNOSIS — N2581 Secondary hyperparathyroidism of renal origin: Secondary | ICD-10-CM | POA: Diagnosis not present

## 2018-07-15 DIAGNOSIS — D509 Iron deficiency anemia, unspecified: Secondary | ICD-10-CM | POA: Diagnosis not present

## 2018-07-15 DIAGNOSIS — N2581 Secondary hyperparathyroidism of renal origin: Secondary | ICD-10-CM | POA: Diagnosis not present

## 2018-07-15 DIAGNOSIS — D631 Anemia in chronic kidney disease: Secondary | ICD-10-CM | POA: Diagnosis not present

## 2018-07-15 DIAGNOSIS — N186 End stage renal disease: Secondary | ICD-10-CM | POA: Diagnosis not present

## 2018-07-15 DIAGNOSIS — Z992 Dependence on renal dialysis: Secondary | ICD-10-CM | POA: Diagnosis not present

## 2018-07-17 DIAGNOSIS — D631 Anemia in chronic kidney disease: Secondary | ICD-10-CM | POA: Diagnosis not present

## 2018-07-17 DIAGNOSIS — D509 Iron deficiency anemia, unspecified: Secondary | ICD-10-CM | POA: Diagnosis not present

## 2018-07-17 DIAGNOSIS — Z992 Dependence on renal dialysis: Secondary | ICD-10-CM | POA: Diagnosis not present

## 2018-07-17 DIAGNOSIS — N186 End stage renal disease: Secondary | ICD-10-CM | POA: Diagnosis not present

## 2018-07-17 DIAGNOSIS — N2581 Secondary hyperparathyroidism of renal origin: Secondary | ICD-10-CM | POA: Diagnosis not present

## 2018-07-19 DIAGNOSIS — D631 Anemia in chronic kidney disease: Secondary | ICD-10-CM | POA: Diagnosis not present

## 2018-07-19 DIAGNOSIS — N2581 Secondary hyperparathyroidism of renal origin: Secondary | ICD-10-CM | POA: Diagnosis not present

## 2018-07-19 DIAGNOSIS — N186 End stage renal disease: Secondary | ICD-10-CM | POA: Diagnosis not present

## 2018-07-19 DIAGNOSIS — Z992 Dependence on renal dialysis: Secondary | ICD-10-CM | POA: Diagnosis not present

## 2018-07-19 DIAGNOSIS — D509 Iron deficiency anemia, unspecified: Secondary | ICD-10-CM | POA: Diagnosis not present

## 2018-07-22 DIAGNOSIS — D509 Iron deficiency anemia, unspecified: Secondary | ICD-10-CM | POA: Diagnosis not present

## 2018-07-22 DIAGNOSIS — N186 End stage renal disease: Secondary | ICD-10-CM | POA: Diagnosis not present

## 2018-07-22 DIAGNOSIS — N2581 Secondary hyperparathyroidism of renal origin: Secondary | ICD-10-CM | POA: Diagnosis not present

## 2018-07-22 DIAGNOSIS — E119 Type 2 diabetes mellitus without complications: Secondary | ICD-10-CM | POA: Diagnosis not present

## 2018-07-22 DIAGNOSIS — D631 Anemia in chronic kidney disease: Secondary | ICD-10-CM | POA: Diagnosis not present

## 2018-07-22 DIAGNOSIS — Z992 Dependence on renal dialysis: Secondary | ICD-10-CM | POA: Diagnosis not present

## 2018-07-24 DIAGNOSIS — D509 Iron deficiency anemia, unspecified: Secondary | ICD-10-CM | POA: Diagnosis not present

## 2018-07-24 DIAGNOSIS — Z992 Dependence on renal dialysis: Secondary | ICD-10-CM | POA: Diagnosis not present

## 2018-07-24 DIAGNOSIS — N186 End stage renal disease: Secondary | ICD-10-CM | POA: Diagnosis not present

## 2018-07-24 DIAGNOSIS — N2581 Secondary hyperparathyroidism of renal origin: Secondary | ICD-10-CM | POA: Diagnosis not present

## 2018-07-24 DIAGNOSIS — D631 Anemia in chronic kidney disease: Secondary | ICD-10-CM | POA: Diagnosis not present

## 2018-07-26 DIAGNOSIS — N186 End stage renal disease: Secondary | ICD-10-CM | POA: Diagnosis not present

## 2018-07-26 DIAGNOSIS — N2581 Secondary hyperparathyroidism of renal origin: Secondary | ICD-10-CM | POA: Diagnosis not present

## 2018-07-26 DIAGNOSIS — D509 Iron deficiency anemia, unspecified: Secondary | ICD-10-CM | POA: Diagnosis not present

## 2018-07-26 DIAGNOSIS — Z992 Dependence on renal dialysis: Secondary | ICD-10-CM | POA: Diagnosis not present

## 2018-07-26 DIAGNOSIS — D631 Anemia in chronic kidney disease: Secondary | ICD-10-CM | POA: Diagnosis not present

## 2018-07-29 DIAGNOSIS — N186 End stage renal disease: Secondary | ICD-10-CM | POA: Diagnosis not present

## 2018-07-29 DIAGNOSIS — N2581 Secondary hyperparathyroidism of renal origin: Secondary | ICD-10-CM | POA: Diagnosis not present

## 2018-07-29 DIAGNOSIS — D631 Anemia in chronic kidney disease: Secondary | ICD-10-CM | POA: Diagnosis not present

## 2018-07-29 DIAGNOSIS — Z992 Dependence on renal dialysis: Secondary | ICD-10-CM | POA: Diagnosis not present

## 2018-07-29 DIAGNOSIS — D509 Iron deficiency anemia, unspecified: Secondary | ICD-10-CM | POA: Diagnosis not present

## 2018-07-30 DIAGNOSIS — Z794 Long term (current) use of insulin: Secondary | ICD-10-CM | POA: Diagnosis not present

## 2018-07-30 DIAGNOSIS — E1140 Type 2 diabetes mellitus with diabetic neuropathy, unspecified: Secondary | ICD-10-CM | POA: Diagnosis not present

## 2018-07-30 DIAGNOSIS — B351 Tinea unguium: Secondary | ICD-10-CM | POA: Diagnosis not present

## 2018-07-31 DIAGNOSIS — N186 End stage renal disease: Secondary | ICD-10-CM | POA: Diagnosis not present

## 2018-07-31 DIAGNOSIS — N2581 Secondary hyperparathyroidism of renal origin: Secondary | ICD-10-CM | POA: Diagnosis not present

## 2018-07-31 DIAGNOSIS — D509 Iron deficiency anemia, unspecified: Secondary | ICD-10-CM | POA: Diagnosis not present

## 2018-07-31 DIAGNOSIS — Z992 Dependence on renal dialysis: Secondary | ICD-10-CM | POA: Diagnosis not present

## 2018-07-31 DIAGNOSIS — D631 Anemia in chronic kidney disease: Secondary | ICD-10-CM | POA: Diagnosis not present

## 2018-08-02 DIAGNOSIS — N186 End stage renal disease: Secondary | ICD-10-CM | POA: Diagnosis not present

## 2018-08-02 DIAGNOSIS — Z992 Dependence on renal dialysis: Secondary | ICD-10-CM | POA: Diagnosis not present

## 2018-08-02 DIAGNOSIS — D631 Anemia in chronic kidney disease: Secondary | ICD-10-CM | POA: Diagnosis not present

## 2018-08-02 DIAGNOSIS — D509 Iron deficiency anemia, unspecified: Secondary | ICD-10-CM | POA: Diagnosis not present

## 2018-08-02 DIAGNOSIS — N2581 Secondary hyperparathyroidism of renal origin: Secondary | ICD-10-CM | POA: Diagnosis not present

## 2018-08-05 DIAGNOSIS — Z992 Dependence on renal dialysis: Secondary | ICD-10-CM | POA: Diagnosis not present

## 2018-08-05 DIAGNOSIS — N2581 Secondary hyperparathyroidism of renal origin: Secondary | ICD-10-CM | POA: Diagnosis not present

## 2018-08-05 DIAGNOSIS — N186 End stage renal disease: Secondary | ICD-10-CM | POA: Diagnosis not present

## 2018-08-05 DIAGNOSIS — D631 Anemia in chronic kidney disease: Secondary | ICD-10-CM | POA: Diagnosis not present

## 2018-08-05 DIAGNOSIS — D509 Iron deficiency anemia, unspecified: Secondary | ICD-10-CM | POA: Diagnosis not present

## 2018-08-07 DIAGNOSIS — D509 Iron deficiency anemia, unspecified: Secondary | ICD-10-CM | POA: Diagnosis not present

## 2018-08-07 DIAGNOSIS — Z992 Dependence on renal dialysis: Secondary | ICD-10-CM | POA: Diagnosis not present

## 2018-08-07 DIAGNOSIS — N2581 Secondary hyperparathyroidism of renal origin: Secondary | ICD-10-CM | POA: Diagnosis not present

## 2018-08-07 DIAGNOSIS — N186 End stage renal disease: Secondary | ICD-10-CM | POA: Diagnosis not present

## 2018-08-07 DIAGNOSIS — D631 Anemia in chronic kidney disease: Secondary | ICD-10-CM | POA: Diagnosis not present

## 2018-08-09 DIAGNOSIS — D509 Iron deficiency anemia, unspecified: Secondary | ICD-10-CM | POA: Diagnosis not present

## 2018-08-09 DIAGNOSIS — N2581 Secondary hyperparathyroidism of renal origin: Secondary | ICD-10-CM | POA: Diagnosis not present

## 2018-08-09 DIAGNOSIS — N186 End stage renal disease: Secondary | ICD-10-CM | POA: Diagnosis not present

## 2018-08-09 DIAGNOSIS — D631 Anemia in chronic kidney disease: Secondary | ICD-10-CM | POA: Diagnosis not present

## 2018-08-09 DIAGNOSIS — Z992 Dependence on renal dialysis: Secondary | ICD-10-CM | POA: Diagnosis not present

## 2018-08-12 DIAGNOSIS — D631 Anemia in chronic kidney disease: Secondary | ICD-10-CM | POA: Diagnosis not present

## 2018-08-12 DIAGNOSIS — Z992 Dependence on renal dialysis: Secondary | ICD-10-CM | POA: Diagnosis not present

## 2018-08-12 DIAGNOSIS — D509 Iron deficiency anemia, unspecified: Secondary | ICD-10-CM | POA: Diagnosis not present

## 2018-08-12 DIAGNOSIS — N186 End stage renal disease: Secondary | ICD-10-CM | POA: Diagnosis not present

## 2018-08-12 DIAGNOSIS — N2581 Secondary hyperparathyroidism of renal origin: Secondary | ICD-10-CM | POA: Diagnosis not present

## 2018-08-14 DIAGNOSIS — D509 Iron deficiency anemia, unspecified: Secondary | ICD-10-CM | POA: Diagnosis not present

## 2018-08-14 DIAGNOSIS — D631 Anemia in chronic kidney disease: Secondary | ICD-10-CM | POA: Diagnosis not present

## 2018-08-14 DIAGNOSIS — N186 End stage renal disease: Secondary | ICD-10-CM | POA: Diagnosis not present

## 2018-08-14 DIAGNOSIS — Z992 Dependence on renal dialysis: Secondary | ICD-10-CM | POA: Diagnosis not present

## 2018-08-14 DIAGNOSIS — N2581 Secondary hyperparathyroidism of renal origin: Secondary | ICD-10-CM | POA: Diagnosis not present

## 2018-08-15 ENCOUNTER — Ambulatory Visit (INDEPENDENT_AMBULATORY_CARE_PROVIDER_SITE_OTHER): Payer: Medicare Other | Admitting: Family Medicine

## 2018-08-15 ENCOUNTER — Other Ambulatory Visit: Payer: Self-pay

## 2018-08-15 ENCOUNTER — Encounter: Payer: Self-pay | Admitting: Family Medicine

## 2018-08-15 VITALS — BP 179/90 | HR 78 | Temp 98.6°F | Ht 76.0 in | Wt 243.0 lb

## 2018-08-15 DIAGNOSIS — Z85528 Personal history of other malignant neoplasm of kidney: Secondary | ICD-10-CM

## 2018-08-15 DIAGNOSIS — I10 Essential (primary) hypertension: Secondary | ICD-10-CM | POA: Diagnosis not present

## 2018-08-15 DIAGNOSIS — I70213 Atherosclerosis of native arteries of extremities with intermittent claudication, bilateral legs: Secondary | ICD-10-CM | POA: Diagnosis not present

## 2018-08-15 DIAGNOSIS — R062 Wheezing: Secondary | ICD-10-CM | POA: Diagnosis not present

## 2018-08-15 DIAGNOSIS — I5032 Chronic diastolic (congestive) heart failure: Secondary | ICD-10-CM

## 2018-08-15 DIAGNOSIS — E1165 Type 2 diabetes mellitus with hyperglycemia: Secondary | ICD-10-CM | POA: Diagnosis not present

## 2018-08-15 DIAGNOSIS — J449 Chronic obstructive pulmonary disease, unspecified: Secondary | ICD-10-CM | POA: Diagnosis not present

## 2018-08-15 MED ORDER — AMLODIPINE BESYLATE 5 MG PO TABS: 5.0000 mg | ORAL_TABLET | Freq: Every day | ORAL | 2 refills | Status: DC

## 2018-08-15 MED ORDER — UMECLIDINIUM-VILANTEROL 62.5-25 MCG/INH IN AEPB: 1.0000 | INHALATION_SPRAY | Freq: Every day | RESPIRATORY_TRACT | 3 refills | Status: DC

## 2018-08-15 MED ORDER — ALBUTEROL SULFATE HFA 108 (90 BASE) MCG/ACT IN AERS: 2.0000 | INHALATION_SPRAY | Freq: Four times a day (QID) | RESPIRATORY_TRACT | 0 refills | Status: DC | PRN

## 2018-08-15 NOTE — Progress Notes (Signed)
BP (!) 179/90   Pulse 78   Temp 98.6 F (37 C) (Oral)   Ht 6\' 4"  (1.93 m)   Wt 243 lb (110.2 kg)   SpO2 96%   BMI 29.58 kg/m    Subjective:    Patient ID: [name], male    DOB: 06/08/1951, 68 y.o.   MRN: [id number]  HPI: CREIGHTON LONGLEY is a 68 y.o. male  Chief Complaint  Patient presents with  . Establish Care    chest congestion  . Diarrhea   Here today to establish care. Has not had a PCP in 2-3 years, just seeing Nephrology and podiatry currently.   Hx of ESRD and renal carcinoma, on dialysis. Compliant with regimen.   Hx of DM, was previously on insulin but got tired of sticking his finger and taking shots so stopped all medicines 4-5 years ago. Has not been checking his sugars or watching diet closely.  COPD - wheezing pretty constantly. Cigar smoker, once every week to two weeks has one cigar. Daughter states he was previously on several inhalers but does not remember what. Has not been on anything in years.   Clear diarrhea x 2 months intermittently, lots of gas, abdominal pain. Has had several colonoscopies and seen multiple specialists about this issue per patient and daughter without any answers. Does not take anything for sxs.   CHF with diastolic dysfunction noted on echocardiogram done through Cardiology in 2018. Pt states he was unaware of this dx and never went back after that because he was told he didn't have to. Denies leg edema, orthopnea, SOB, CP.   HTN - currently taking losartan, not checking home BPs.   Relevant past medical, surgical, family and social history reviewed and updated as indicated. Interim medical history since our last visit reviewed. Allergies and medications reviewed and updated.  Review of Systems  Per HPI unless specifically indicated above     Objective:    BP (!) 179/90   Pulse 78   Temp 98.6 F (37 C) (Oral)   Ht 6\' 4"  (1.93 m)   Wt 243 lb (110.2 kg)   SpO2 96%   BMI 29.58 kg/m   Wt Readings from Last 3  Encounters:  08/15/18 243 lb (110.2 kg)  06/27/18 245 lb 9.6 oz (111.4 kg)  06/04/18 253 lb 8.5 oz (115 kg)    Physical Exam Vitals signs and nursing note reviewed.  Constitutional:      Appearance: Normal appearance.  HENT:     Head: Atraumatic.  Eyes:     Extraocular Movements: Extraocular movements intact.     Conjunctiva/sclera: Conjunctivae normal.  Neck:     Musculoskeletal: Normal range of motion and neck supple.  Cardiovascular:     Rate and Rhythm: Normal rate and regular rhythm.  Pulmonary:     Effort: Pulmonary effort is normal.     Breath sounds: Normal breath sounds.  Abdominal:     General: Bowel sounds are normal.     Palpations: Abdomen is soft.  Musculoskeletal: Normal range of motion.  Skin:    General: Skin is warm and dry.  Neurological:     General: No focal deficit present.     Mental Status: He is oriented to person, place, and time.  Psychiatric:        Mood and Affect: Mood normal.        Thought Content: Thought content normal.        Judgment: Judgment normal.  Results for orders placed or performed in visit on 08/15/18  Comprehensive metabolic panel  Result Value Ref Range   Glucose 137 (H) 65 - 99 mg/dL   BUN 36 (H) 8 - 27 mg/dL   Creatinine, Ser 9.77 (H) 0.76 - 1.27 mg/dL   GFR calc non Af Amer 5 (L) >59 mL/min/1.73   GFR calc Af Amer 6 (L) >59 mL/min/1.73   BUN/Creatinine Ratio 4 (L) 10 - 24   Sodium 143 134 - 144 mmol/L   Potassium 4.2 3.5 - 5.2 mmol/L   Chloride 96 96 - 106 mmol/L   CO2 29 20 - 29 mmol/L   Calcium 8.9 8.6 - 10.2 mg/dL   Total Protein 7.0 6.0 - 8.5 g/dL   Albumin 4.0 3.8 - 4.8 g/dL   Globulin, Total 3.0 1.5 - 4.5 g/dL   Albumin/Globulin Ratio 1.3 1.2 - 2.2   Bilirubin Total 0.4 0.0 - 1.2 mg/dL   Alkaline Phosphatase 88 39 - 117 IU/L   AST 15 0 - 40 IU/L   ALT 10 0 - 44 IU/L  Lipid Panel w/o Chol/HDL Ratio  Result Value Ref Range   Cholesterol, Total 103 100 - 199 mg/dL   Triglycerides 84 0 - 149 mg/dL    HDL 35 (L) >39 mg/dL   VLDL Cholesterol Cal 17 5 - 40 mg/dL   LDL Calculated 51 0 - 99 mg/dL  HgB A1c  Result Value Ref Range   Hgb A1c MFr Bld 7.6 (H) 4.8 - 5.6 %   Est. average glucose Bld gHb Est-mCnc 171 mg/dL      Assessment & Plan:   Problem List Items Addressed This Visit      Cardiovascular and Mediastinum   Essential hypertension    Not at goal. Start amlodipine and monitor carefully at home. Script given for home monitor. Continue losartan      Relevant Medications   losartan (COZAAR) 100 MG tablet   amLODipine (NORVASC) 5 MG tablet   Chronic diastolic heart failure [facility])    Per Cardiology note 11/2016 - pt was unaware of dx. Wanting referral to new Cardiologist      Relevant Medications   losartan (COZAAR) 100 MG tablet   amLODipine (NORVASC) 5 MG tablet   Other Relevant Orders   Ambulatory referral to Cardiology     Respiratory   COPD (chronic obstructive pulmonary disease) ([place])    Will restart inhaler regimen - anoro daily and albuterol prn.       Relevant Medications   albuterol (PROVENTIL HFA;VENTOLIN HFA) 108 (90 Base) MCG/ACT inhaler   umeclidinium-vilanterol (ANORO ELLIPTA) 62.5-25 MCG/INH AEPB     Endocrine   Type 2 diabetes mellitus with hyperglycemia (HCC)    Will check A1C and likely need to restart insulin. Lifestyle modifications reviewed      Relevant Medications   losartan (COZAAR) 100 MG tablet   Other Relevant Orders   Comprehensive metabolic panel (Completed)   HgB A1c (Completed)     Other   Atherosclerosis of native arteries of extremity with intermittent claudication ([place]) - Primary    Not currently on statin, will obtain past records, labs, and get him back in with Cardiology.       Relevant Orders   Lipid Panel w/o Chol/HDL Ratio (Completed)   History of renal carcinoma    Followed by Nephrology       Other Visit Diagnoses    Wheezing       Relevant Orders   DG Chest 2  View       Follow up plan: Return in about 3  months (around 11/13/2018) for DM.

## 2018-08-16 DIAGNOSIS — D509 Iron deficiency anemia, unspecified: Secondary | ICD-10-CM | POA: Diagnosis not present

## 2018-08-16 DIAGNOSIS — N2581 Secondary hyperparathyroidism of renal origin: Secondary | ICD-10-CM | POA: Diagnosis not present

## 2018-08-16 DIAGNOSIS — Z992 Dependence on renal dialysis: Secondary | ICD-10-CM | POA: Diagnosis not present

## 2018-08-16 DIAGNOSIS — N186 End stage renal disease: Secondary | ICD-10-CM | POA: Diagnosis not present

## 2018-08-16 DIAGNOSIS — D631 Anemia in chronic kidney disease: Secondary | ICD-10-CM | POA: Diagnosis not present

## 2018-08-16 LAB — LIPID PANEL W/O CHOL/HDL RATIO
Cholesterol, Total: 103 mg/dL (ref 100–199)
HDL: 35 mg/dL — ABNORMAL LOW (ref >39)
LDL CALC: 51 mg/dL (ref 0–99)
Triglycerides: 84 mg/dL (ref 0–149)
VLDL CHOLESTEROL CAL: 17 mg/dL (ref 5–40)

## 2018-08-16 LAB — HEMOGLOBIN A1C
ESTIMATED AVERAGE GLUCOSE: 171 mg/dL
Hgb A1c MFr Bld: 7.6 % — ABNORMAL HIGH (ref 4.8–5.6)

## 2018-08-16 LAB — COMPREHENSIVE METABOLIC PANEL
ALBUMIN: 4 g/dL (ref 3.8–4.8)
ALT: 10 IU/L (ref 0–44)
AST: 15 IU/L (ref 0–40)
Albumin/Globulin Ratio: 1.3 (ref 1.2–2.2)
Alkaline Phosphatase: 88 IU/L (ref 39–117)
BUN / CREAT RATIO: 4 — AB (ref 10–24)
BUN: 36 mg/dL — AB (ref 8–27)
Bilirubin Total: 0.4 mg/dL (ref 0.0–1.2)
CALCIUM: 8.9 mg/dL (ref 8.6–10.2)
CO2: 29 mmol/L (ref 20–29)
Chloride: 96 mmol/L (ref 96–106)
Creatinine, Ser: 9.77 mg/dL — ABNORMAL HIGH (ref 0.76–1.27)
GFR, EST AFRICAN AMERICAN: 6 mL/min/{1.73_m2} — AB (ref >59)
GFR, EST NON AFRICAN AMERICAN: 5 mL/min/{1.73_m2} — AB (ref >59)
GLUCOSE: 137 mg/dL — AB (ref 65–99)
Globulin, Total: 3 g/dL (ref 1.5–4.5)
Potassium: 4.2 mmol/L (ref 3.5–5.2)
Sodium: 143 mmol/L (ref 134–144)
TOTAL PROTEIN: 7 g/dL (ref 6.0–8.5)

## 2018-08-17 IMAGING — CR DG CHEST 2V
2 series · 3 of 3 positions shown · non-contrast
Comparison: 09/25/2015

CLINICAL DATA: Sepsis.  Dialysis patient

EXAM:
CHEST  2 VIEW

[Series 2: chest lat · 0.14mm/px · 2 of 2 slices shown]
[im 1/2]
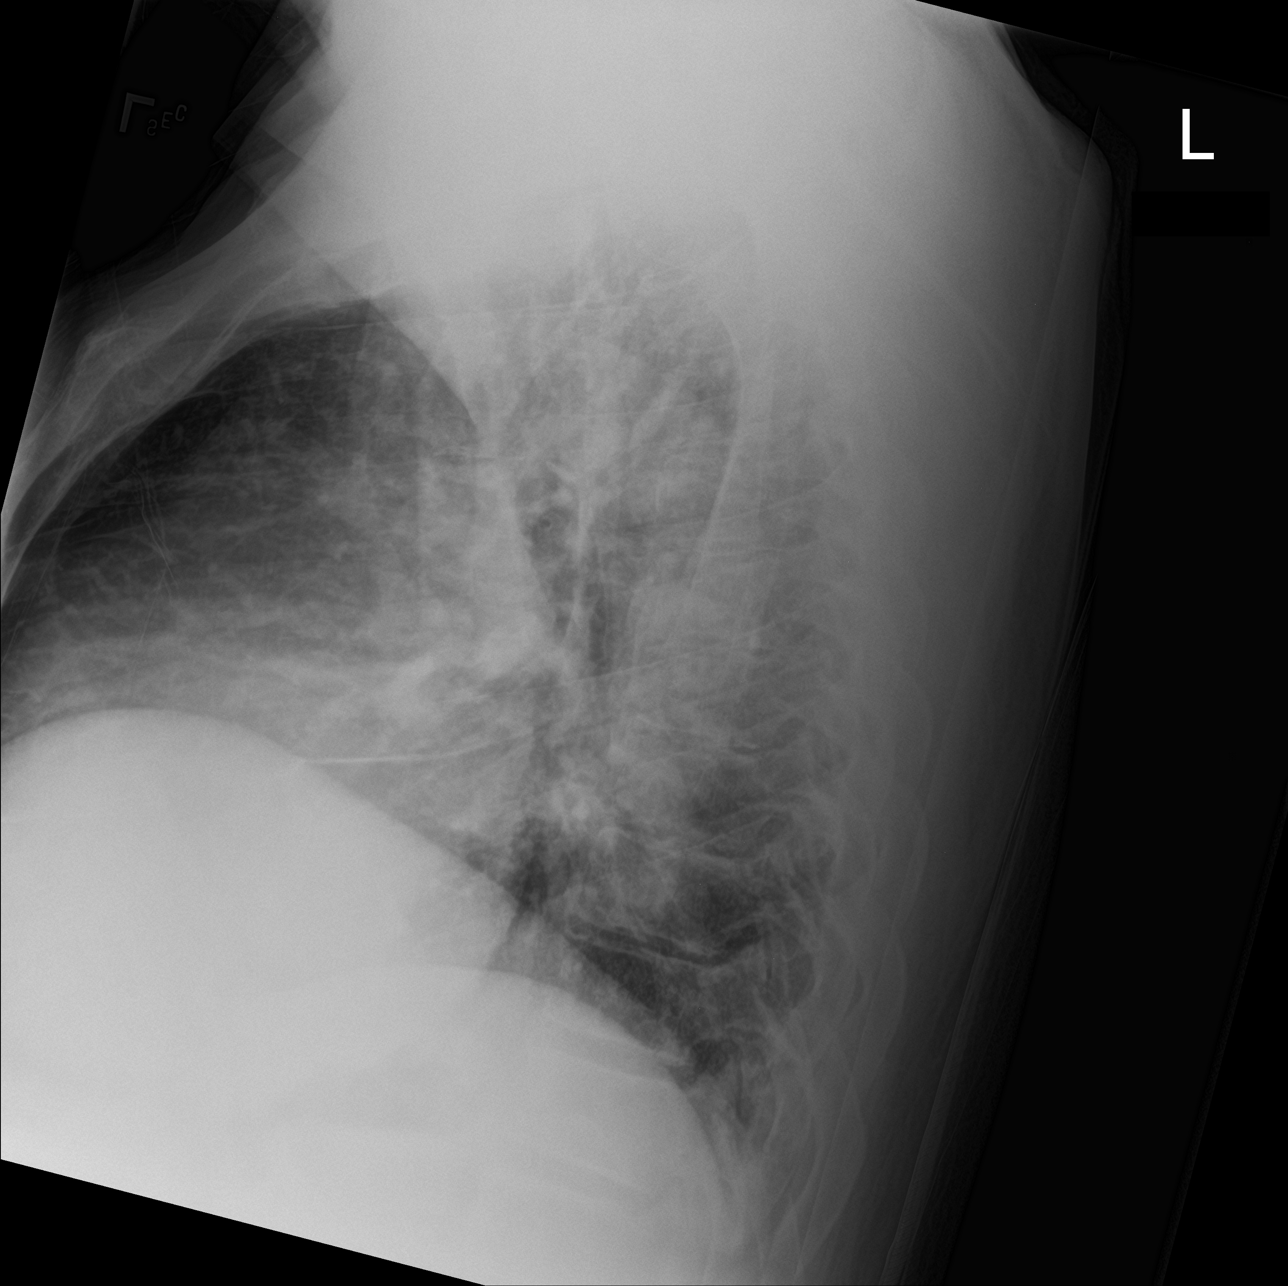
[im 2/2]
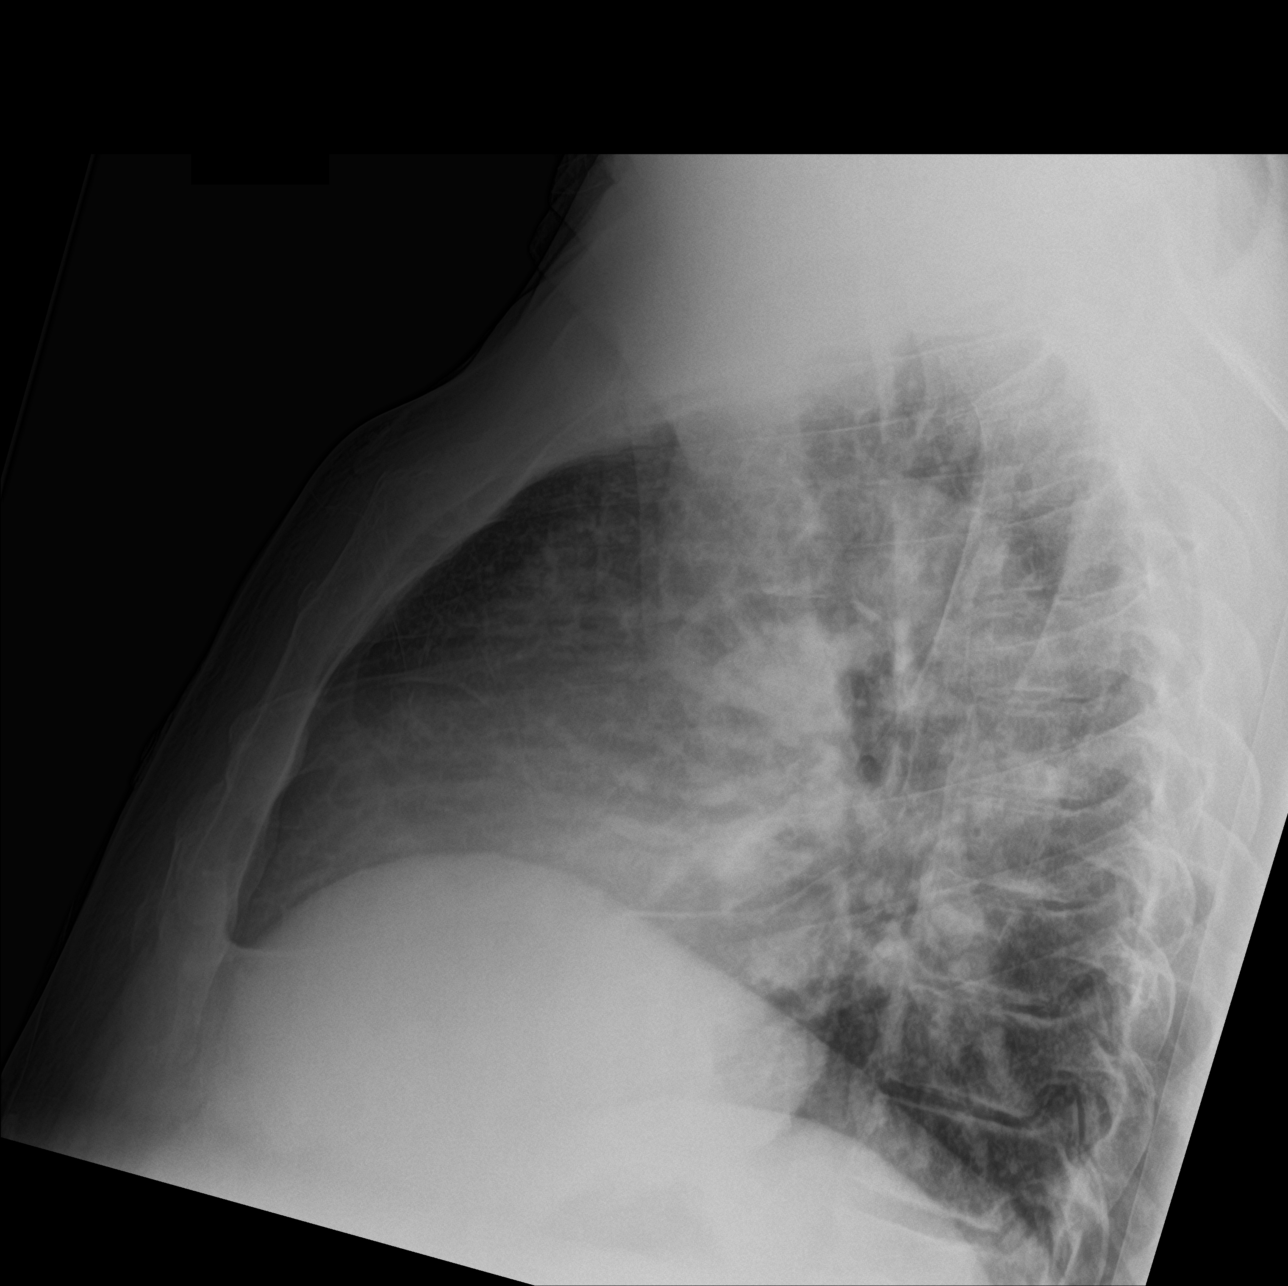

[chest ap]
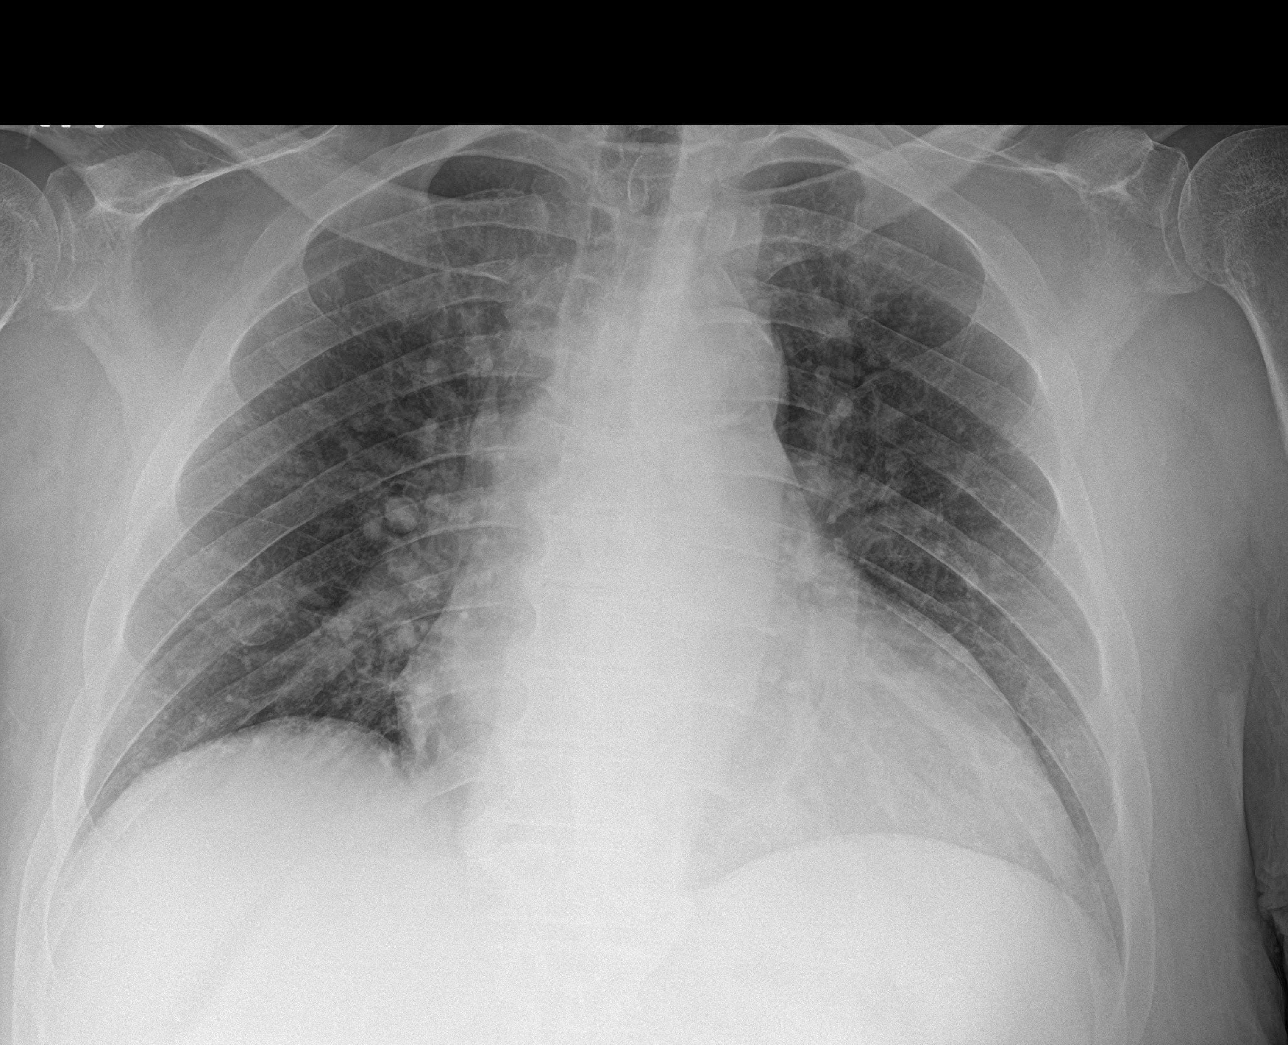

[3 of 3 positions shown; findings below may reference images not displayed]

FINDINGS: Cardiac enlargement with vascular congestion. Negative for edema or
effusion. Negative for pneumonia
IMPRESSION: Pulmonary vascular congestion without edema

## 2018-08-19 ENCOUNTER — Other Ambulatory Visit: Payer: Self-pay | Admitting: Family Medicine

## 2018-08-19 DIAGNOSIS — D509 Iron deficiency anemia, unspecified: Secondary | ICD-10-CM | POA: Diagnosis not present

## 2018-08-19 DIAGNOSIS — N186 End stage renal disease: Secondary | ICD-10-CM | POA: Diagnosis not present

## 2018-08-19 DIAGNOSIS — N2581 Secondary hyperparathyroidism of renal origin: Secondary | ICD-10-CM | POA: Diagnosis not present

## 2018-08-19 DIAGNOSIS — Z992 Dependence on renal dialysis: Secondary | ICD-10-CM | POA: Diagnosis not present

## 2018-08-19 DIAGNOSIS — D631 Anemia in chronic kidney disease: Secondary | ICD-10-CM | POA: Diagnosis not present

## 2018-08-19 MED ORDER — INSULIN DEGLUDEC 100 UNIT/ML ~~LOC~~ SOPN: 10.0000 [IU] | PEN_INJECTOR | Freq: Every day | SUBCUTANEOUS | 2 refills | Status: DC

## 2018-08-19 NOTE — Progress Notes (Signed)
   There were no vitals taken for this visit.   Subjective:    Patient ID: Timothy Gibson, male    DOB: 02-01-1951, 68 y.o.   MRN: [id number]  HPI: Timothy Gibson is a 68 y.o. male  No chief complaint on file.   Relevant past medical, surgical, family and social history reviewed and updated as indicated. Interim medical history since our last visit reviewed. Allergies and medications reviewed and updated.  Review of Systems  Per HPI unless specifically indicated above     Objective:    There were no vitals taken for this visit.  Wt Readings from Last 3 Encounters:  08/15/18 243 lb (110.2 kg)  06/27/18 245 lb 9.6 oz (111.4 kg)  06/04/18 253 lb 8.5 oz (115 kg)    Physical Exam  Results for orders placed or performed in visit on 08/15/18  Comprehensive metabolic panel  Result Value Ref Range   Glucose 137 (H) 65 - 99 mg/dL   BUN 36 (H) 8 - 27 mg/dL   Creatinine, Ser 9.77 (H) 0.76 - 1.27 mg/dL   GFR calc non Af Amer 5 (L) >59 mL/min/1.73   GFR calc Af Amer 6 (L) >59 mL/min/1.73   BUN/Creatinine Ratio 4 (L) 10 - 24   Sodium 143 134 - 144 mmol/L   Potassium 4.2 3.5 - 5.2 mmol/L   Chloride 96 96 - 106 mmol/L   CO2 29 20 - 29 mmol/L   Calcium 8.9 8.6 - 10.2 mg/dL   Total Protein 7.0 6.0 - 8.5 g/dL   Albumin 4.0 3.8 - 4.8 g/dL   Globulin, Total 3.0 1.5 - 4.5 g/dL   Albumin/Globulin Ratio 1.3 1.2 - 2.2   Bilirubin Total 0.4 0.0 - 1.2 mg/dL   Alkaline Phosphatase 88 39 - 117 IU/L   AST 15 0 - 40 IU/L   ALT 10 0 - 44 IU/L  Lipid Panel w/o Chol/HDL Ratio  Result Value Ref Range   Cholesterol, Total 103 100 - 199 mg/dL   Triglycerides 84 0 - 149 mg/dL   HDL 35 (L) >39 mg/dL   VLDL Cholesterol Cal 17 5 - 40 mg/dL   LDL Calculated 51 0 - 99 mg/dL  HgB A1c  Result Value Ref Range   Hgb A1c MFr Bld 7.6 (H) 4.8 - 5.6 %   Est. average glucose Bld gHb Est-mCnc 171 mg/dL      Assessment & Plan:   Problem List Items Addressed This Visit    None       Follow up  plan: No follow-ups on file.

## 2018-08-20 DIAGNOSIS — I5033 Acute on chronic diastolic (congestive) heart failure: Secondary | ICD-10-CM | POA: Insufficient documentation

## 2018-08-20 DIAGNOSIS — I5032 Chronic diastolic (congestive) heart failure: Secondary | ICD-10-CM | POA: Insufficient documentation

## 2018-08-20 NOTE — Assessment & Plan Note (Addendum)
Not currently on statin, will obtain past records, labs, and get him back in with Cardiology.

## 2018-08-20 NOTE — Assessment & Plan Note (Signed)
Will restart inhaler regimen - anoro daily and albuterol prn.

## 2018-08-20 NOTE — Assessment & Plan Note (Signed)
Followed by Nephrology.

## 2018-08-20 NOTE — Assessment & Plan Note (Signed)
Not at goal. Start amlodipine and monitor carefully at home. Script given for home monitor. Continue losartan

## 2018-08-20 NOTE — Assessment & Plan Note (Signed)
Per Cardiology note 11/2016 - pt was unaware of dx. Wanting referral to new Cardiologist

## 2018-08-20 NOTE — Assessment & Plan Note (Signed)
Will check A1C and likely need to restart insulin. Lifestyle modifications reviewed

## 2018-08-21 DIAGNOSIS — N2581 Secondary hyperparathyroidism of renal origin: Secondary | ICD-10-CM | POA: Diagnosis not present

## 2018-08-21 DIAGNOSIS — N186 End stage renal disease: Secondary | ICD-10-CM | POA: Diagnosis not present

## 2018-08-21 DIAGNOSIS — Z992 Dependence on renal dialysis: Secondary | ICD-10-CM | POA: Diagnosis not present

## 2018-08-21 DIAGNOSIS — D631 Anemia in chronic kidney disease: Secondary | ICD-10-CM | POA: Diagnosis not present

## 2018-08-21 DIAGNOSIS — D509 Iron deficiency anemia, unspecified: Secondary | ICD-10-CM | POA: Diagnosis not present

## 2018-08-23 DIAGNOSIS — Z992 Dependence on renal dialysis: Secondary | ICD-10-CM | POA: Diagnosis not present

## 2018-08-23 DIAGNOSIS — D509 Iron deficiency anemia, unspecified: Secondary | ICD-10-CM | POA: Diagnosis not present

## 2018-08-23 DIAGNOSIS — D631 Anemia in chronic kidney disease: Secondary | ICD-10-CM | POA: Diagnosis not present

## 2018-08-23 DIAGNOSIS — N2581 Secondary hyperparathyroidism of renal origin: Secondary | ICD-10-CM | POA: Diagnosis not present

## 2018-08-23 DIAGNOSIS — N186 End stage renal disease: Secondary | ICD-10-CM | POA: Diagnosis not present

## 2018-08-26 ENCOUNTER — Ambulatory Visit
Admission: RE | Admit: 2018-08-26 | Discharge: 2018-08-26 | Disposition: A | Discharge status: Home / Self Care | Payer: Medicare Other | Source: Ambulatory Visit | Attending: Family Medicine | Admitting: Family Medicine

## 2018-08-26 DIAGNOSIS — N186 End stage renal disease: Secondary | ICD-10-CM | POA: Diagnosis not present

## 2018-08-26 DIAGNOSIS — Z992 Dependence on renal dialysis: Secondary | ICD-10-CM | POA: Diagnosis not present

## 2018-08-26 DIAGNOSIS — R062 Wheezing: Secondary | ICD-10-CM | POA: Diagnosis not present

## 2018-08-26 DIAGNOSIS — D509 Iron deficiency anemia, unspecified: Secondary | ICD-10-CM | POA: Diagnosis not present

## 2018-08-26 DIAGNOSIS — D631 Anemia in chronic kidney disease: Secondary | ICD-10-CM | POA: Diagnosis not present

## 2018-08-26 DIAGNOSIS — N2581 Secondary hyperparathyroidism of renal origin: Secondary | ICD-10-CM | POA: Diagnosis not present

## 2018-08-28 DIAGNOSIS — D509 Iron deficiency anemia, unspecified: Secondary | ICD-10-CM | POA: Diagnosis not present

## 2018-08-28 DIAGNOSIS — N186 End stage renal disease: Secondary | ICD-10-CM | POA: Diagnosis not present

## 2018-08-28 DIAGNOSIS — Z992 Dependence on renal dialysis: Secondary | ICD-10-CM | POA: Diagnosis not present

## 2018-08-28 DIAGNOSIS — D631 Anemia in chronic kidney disease: Secondary | ICD-10-CM | POA: Diagnosis not present

## 2018-08-28 DIAGNOSIS — N2581 Secondary hyperparathyroidism of renal origin: Secondary | ICD-10-CM | POA: Diagnosis not present

## 2018-08-29 DIAGNOSIS — E1165 Type 2 diabetes mellitus with hyperglycemia: Secondary | ICD-10-CM | POA: Diagnosis not present

## 2018-08-29 DIAGNOSIS — I10 Essential (primary) hypertension: Secondary | ICD-10-CM | POA: Diagnosis not present

## 2018-08-29 DIAGNOSIS — I259 Chronic ischemic heart disease, unspecified: Secondary | ICD-10-CM | POA: Diagnosis not present

## 2018-08-29 DIAGNOSIS — N186 End stage renal disease: Secondary | ICD-10-CM | POA: Diagnosis not present

## 2018-08-29 DIAGNOSIS — M86171 Other acute osteomyelitis, right ankle and foot: Secondary | ICD-10-CM | POA: Diagnosis not present

## 2018-08-29 DIAGNOSIS — I5032 Chronic diastolic (congestive) heart failure: Secondary | ICD-10-CM | POA: Diagnosis not present

## 2018-08-29 DIAGNOSIS — J449 Chronic obstructive pulmonary disease, unspecified: Secondary | ICD-10-CM | POA: Diagnosis not present

## 2018-08-29 DIAGNOSIS — N3000 Acute cystitis without hematuria: Secondary | ICD-10-CM | POA: Diagnosis not present

## 2018-08-29 DIAGNOSIS — N401 Enlarged prostate with lower urinary tract symptoms: Secondary | ICD-10-CM | POA: Diagnosis not present

## 2018-08-29 DIAGNOSIS — Z85528 Personal history of other malignant neoplasm of kidney: Secondary | ICD-10-CM | POA: Diagnosis not present

## 2018-08-30 DIAGNOSIS — D509 Iron deficiency anemia, unspecified: Secondary | ICD-10-CM | POA: Diagnosis not present

## 2018-08-30 DIAGNOSIS — D631 Anemia in chronic kidney disease: Secondary | ICD-10-CM | POA: Diagnosis not present

## 2018-08-30 DIAGNOSIS — Z992 Dependence on renal dialysis: Secondary | ICD-10-CM | POA: Diagnosis not present

## 2018-08-30 DIAGNOSIS — N2581 Secondary hyperparathyroidism of renal origin: Secondary | ICD-10-CM | POA: Diagnosis not present

## 2018-08-30 DIAGNOSIS — N186 End stage renal disease: Secondary | ICD-10-CM | POA: Diagnosis not present

## 2018-09-02 DIAGNOSIS — D631 Anemia in chronic kidney disease: Secondary | ICD-10-CM | POA: Diagnosis not present

## 2018-09-02 DIAGNOSIS — D509 Iron deficiency anemia, unspecified: Secondary | ICD-10-CM | POA: Diagnosis not present

## 2018-09-02 DIAGNOSIS — N2581 Secondary hyperparathyroidism of renal origin: Secondary | ICD-10-CM | POA: Diagnosis not present

## 2018-09-02 DIAGNOSIS — N186 End stage renal disease: Secondary | ICD-10-CM | POA: Diagnosis not present

## 2018-09-02 DIAGNOSIS — Z992 Dependence on renal dialysis: Secondary | ICD-10-CM | POA: Diagnosis not present

## 2018-09-04 DIAGNOSIS — D631 Anemia in chronic kidney disease: Secondary | ICD-10-CM | POA: Diagnosis not present

## 2018-09-04 DIAGNOSIS — N186 End stage renal disease: Secondary | ICD-10-CM | POA: Diagnosis not present

## 2018-09-04 DIAGNOSIS — Z992 Dependence on renal dialysis: Secondary | ICD-10-CM | POA: Diagnosis not present

## 2018-09-04 DIAGNOSIS — D509 Iron deficiency anemia, unspecified: Secondary | ICD-10-CM | POA: Diagnosis not present

## 2018-09-04 DIAGNOSIS — N2581 Secondary hyperparathyroidism of renal origin: Secondary | ICD-10-CM | POA: Diagnosis not present

## 2018-09-06 DIAGNOSIS — N2581 Secondary hyperparathyroidism of renal origin: Secondary | ICD-10-CM | POA: Diagnosis not present

## 2018-09-06 DIAGNOSIS — Z992 Dependence on renal dialysis: Secondary | ICD-10-CM | POA: Diagnosis not present

## 2018-09-06 DIAGNOSIS — N186 End stage renal disease: Secondary | ICD-10-CM | POA: Diagnosis not present

## 2018-09-06 DIAGNOSIS — D509 Iron deficiency anemia, unspecified: Secondary | ICD-10-CM | POA: Diagnosis not present

## 2018-09-06 DIAGNOSIS — D631 Anemia in chronic kidney disease: Secondary | ICD-10-CM | POA: Diagnosis not present

## 2018-09-07 DIAGNOSIS — N186 End stage renal disease: Secondary | ICD-10-CM | POA: Diagnosis not present

## 2018-09-07 DIAGNOSIS — Z992 Dependence on renal dialysis: Secondary | ICD-10-CM | POA: Diagnosis not present

## 2018-09-09 DIAGNOSIS — N2581 Secondary hyperparathyroidism of renal origin: Secondary | ICD-10-CM | POA: Diagnosis not present

## 2018-09-09 DIAGNOSIS — D509 Iron deficiency anemia, unspecified: Secondary | ICD-10-CM | POA: Diagnosis not present

## 2018-09-09 DIAGNOSIS — N186 End stage renal disease: Secondary | ICD-10-CM | POA: Diagnosis not present

## 2018-09-09 DIAGNOSIS — Z992 Dependence on renal dialysis: Secondary | ICD-10-CM | POA: Diagnosis not present

## 2018-09-09 DIAGNOSIS — D631 Anemia in chronic kidney disease: Secondary | ICD-10-CM | POA: Diagnosis not present

## 2018-09-11 DIAGNOSIS — D631 Anemia in chronic kidney disease: Secondary | ICD-10-CM | POA: Diagnosis not present

## 2018-09-11 DIAGNOSIS — D509 Iron deficiency anemia, unspecified: Secondary | ICD-10-CM | POA: Diagnosis not present

## 2018-09-11 DIAGNOSIS — Z992 Dependence on renal dialysis: Secondary | ICD-10-CM | POA: Diagnosis not present

## 2018-09-11 DIAGNOSIS — N2581 Secondary hyperparathyroidism of renal origin: Secondary | ICD-10-CM | POA: Diagnosis not present

## 2018-09-11 DIAGNOSIS — N186 End stage renal disease: Secondary | ICD-10-CM | POA: Diagnosis not present

## 2018-09-13 DIAGNOSIS — N186 End stage renal disease: Secondary | ICD-10-CM | POA: Diagnosis not present

## 2018-09-13 DIAGNOSIS — D631 Anemia in chronic kidney disease: Secondary | ICD-10-CM | POA: Diagnosis not present

## 2018-09-13 DIAGNOSIS — D509 Iron deficiency anemia, unspecified: Secondary | ICD-10-CM | POA: Diagnosis not present

## 2018-09-13 DIAGNOSIS — Z992 Dependence on renal dialysis: Secondary | ICD-10-CM | POA: Diagnosis not present

## 2018-09-13 DIAGNOSIS — N2581 Secondary hyperparathyroidism of renal origin: Secondary | ICD-10-CM | POA: Diagnosis not present

## 2018-09-16 DIAGNOSIS — N186 End stage renal disease: Secondary | ICD-10-CM | POA: Diagnosis not present

## 2018-09-16 DIAGNOSIS — N2581 Secondary hyperparathyroidism of renal origin: Secondary | ICD-10-CM | POA: Diagnosis not present

## 2018-09-16 DIAGNOSIS — D631 Anemia in chronic kidney disease: Secondary | ICD-10-CM | POA: Diagnosis not present

## 2018-09-16 DIAGNOSIS — Z992 Dependence on renal dialysis: Secondary | ICD-10-CM | POA: Diagnosis not present

## 2018-09-16 DIAGNOSIS — D509 Iron deficiency anemia, unspecified: Secondary | ICD-10-CM | POA: Diagnosis not present

## 2018-09-18 DIAGNOSIS — D631 Anemia in chronic kidney disease: Secondary | ICD-10-CM | POA: Diagnosis not present

## 2018-09-18 DIAGNOSIS — Z992 Dependence on renal dialysis: Secondary | ICD-10-CM | POA: Diagnosis not present

## 2018-09-18 DIAGNOSIS — N186 End stage renal disease: Secondary | ICD-10-CM | POA: Diagnosis not present

## 2018-09-18 DIAGNOSIS — D509 Iron deficiency anemia, unspecified: Secondary | ICD-10-CM | POA: Diagnosis not present

## 2018-09-18 DIAGNOSIS — N2581 Secondary hyperparathyroidism of renal origin: Secondary | ICD-10-CM | POA: Diagnosis not present

## 2018-09-20 DIAGNOSIS — N2581 Secondary hyperparathyroidism of renal origin: Secondary | ICD-10-CM | POA: Diagnosis not present

## 2018-09-20 DIAGNOSIS — D509 Iron deficiency anemia, unspecified: Secondary | ICD-10-CM | POA: Diagnosis not present

## 2018-09-20 DIAGNOSIS — N186 End stage renal disease: Secondary | ICD-10-CM | POA: Diagnosis not present

## 2018-09-20 DIAGNOSIS — D631 Anemia in chronic kidney disease: Secondary | ICD-10-CM | POA: Diagnosis not present

## 2018-09-20 DIAGNOSIS — Z992 Dependence on renal dialysis: Secondary | ICD-10-CM | POA: Diagnosis not present

## 2018-09-23 DIAGNOSIS — N2581 Secondary hyperparathyroidism of renal origin: Secondary | ICD-10-CM | POA: Diagnosis not present

## 2018-09-23 DIAGNOSIS — D631 Anemia in chronic kidney disease: Secondary | ICD-10-CM | POA: Diagnosis not present

## 2018-09-23 DIAGNOSIS — N186 End stage renal disease: Secondary | ICD-10-CM | POA: Diagnosis not present

## 2018-09-23 DIAGNOSIS — D509 Iron deficiency anemia, unspecified: Secondary | ICD-10-CM | POA: Diagnosis not present

## 2018-09-23 DIAGNOSIS — Z992 Dependence on renal dialysis: Secondary | ICD-10-CM | POA: Diagnosis not present

## 2018-09-25 DIAGNOSIS — D509 Iron deficiency anemia, unspecified: Secondary | ICD-10-CM | POA: Diagnosis not present

## 2018-09-25 DIAGNOSIS — Z992 Dependence on renal dialysis: Secondary | ICD-10-CM | POA: Diagnosis not present

## 2018-09-25 DIAGNOSIS — N186 End stage renal disease: Secondary | ICD-10-CM | POA: Diagnosis not present

## 2018-09-25 DIAGNOSIS — D631 Anemia in chronic kidney disease: Secondary | ICD-10-CM | POA: Diagnosis not present

## 2018-09-25 DIAGNOSIS — N2581 Secondary hyperparathyroidism of renal origin: Secondary | ICD-10-CM | POA: Diagnosis not present

## 2018-09-27 DIAGNOSIS — N2581 Secondary hyperparathyroidism of renal origin: Secondary | ICD-10-CM | POA: Diagnosis not present

## 2018-09-27 DIAGNOSIS — D509 Iron deficiency anemia, unspecified: Secondary | ICD-10-CM | POA: Diagnosis not present

## 2018-09-27 DIAGNOSIS — N186 End stage renal disease: Secondary | ICD-10-CM | POA: Diagnosis not present

## 2018-09-27 DIAGNOSIS — Z992 Dependence on renal dialysis: Secondary | ICD-10-CM | POA: Diagnosis not present

## 2018-09-27 DIAGNOSIS — D631 Anemia in chronic kidney disease: Secondary | ICD-10-CM | POA: Diagnosis not present

## 2018-09-30 DIAGNOSIS — N2581 Secondary hyperparathyroidism of renal origin: Secondary | ICD-10-CM | POA: Diagnosis not present

## 2018-09-30 DIAGNOSIS — D631 Anemia in chronic kidney disease: Secondary | ICD-10-CM | POA: Diagnosis not present

## 2018-09-30 DIAGNOSIS — Z992 Dependence on renal dialysis: Secondary | ICD-10-CM | POA: Diagnosis not present

## 2018-09-30 DIAGNOSIS — D509 Iron deficiency anemia, unspecified: Secondary | ICD-10-CM | POA: Diagnosis not present

## 2018-09-30 DIAGNOSIS — N186 End stage renal disease: Secondary | ICD-10-CM | POA: Diagnosis not present

## 2018-10-02 DIAGNOSIS — N2581 Secondary hyperparathyroidism of renal origin: Secondary | ICD-10-CM | POA: Diagnosis not present

## 2018-10-02 DIAGNOSIS — D631 Anemia in chronic kidney disease: Secondary | ICD-10-CM | POA: Diagnosis not present

## 2018-10-02 DIAGNOSIS — Z992 Dependence on renal dialysis: Secondary | ICD-10-CM | POA: Diagnosis not present

## 2018-10-02 DIAGNOSIS — D509 Iron deficiency anemia, unspecified: Secondary | ICD-10-CM | POA: Diagnosis not present

## 2018-10-02 DIAGNOSIS — N186 End stage renal disease: Secondary | ICD-10-CM | POA: Diagnosis not present

## 2018-10-04 DIAGNOSIS — D509 Iron deficiency anemia, unspecified: Secondary | ICD-10-CM | POA: Diagnosis not present

## 2018-10-04 DIAGNOSIS — N2581 Secondary hyperparathyroidism of renal origin: Secondary | ICD-10-CM | POA: Diagnosis not present

## 2018-10-04 DIAGNOSIS — Z992 Dependence on renal dialysis: Secondary | ICD-10-CM | POA: Diagnosis not present

## 2018-10-04 DIAGNOSIS — N186 End stage renal disease: Secondary | ICD-10-CM | POA: Diagnosis not present

## 2018-10-04 DIAGNOSIS — D631 Anemia in chronic kidney disease: Secondary | ICD-10-CM | POA: Diagnosis not present

## 2018-10-07 DIAGNOSIS — D631 Anemia in chronic kidney disease: Secondary | ICD-10-CM | POA: Diagnosis not present

## 2018-10-07 DIAGNOSIS — N186 End stage renal disease: Secondary | ICD-10-CM | POA: Diagnosis not present

## 2018-10-07 DIAGNOSIS — N2581 Secondary hyperparathyroidism of renal origin: Secondary | ICD-10-CM | POA: Diagnosis not present

## 2018-10-07 DIAGNOSIS — Z992 Dependence on renal dialysis: Secondary | ICD-10-CM | POA: Diagnosis not present

## 2018-10-07 DIAGNOSIS — D509 Iron deficiency anemia, unspecified: Secondary | ICD-10-CM | POA: Diagnosis not present

## 2018-10-08 DIAGNOSIS — N186 End stage renal disease: Secondary | ICD-10-CM | POA: Diagnosis not present

## 2018-10-08 DIAGNOSIS — Z992 Dependence on renal dialysis: Secondary | ICD-10-CM | POA: Diagnosis not present

## 2018-10-09 DIAGNOSIS — D631 Anemia in chronic kidney disease: Secondary | ICD-10-CM | POA: Diagnosis not present

## 2018-10-09 DIAGNOSIS — Z992 Dependence on renal dialysis: Secondary | ICD-10-CM | POA: Diagnosis not present

## 2018-10-09 DIAGNOSIS — N2581 Secondary hyperparathyroidism of renal origin: Secondary | ICD-10-CM | POA: Diagnosis not present

## 2018-10-09 DIAGNOSIS — D509 Iron deficiency anemia, unspecified: Secondary | ICD-10-CM | POA: Diagnosis not present

## 2018-10-09 DIAGNOSIS — N186 End stage renal disease: Secondary | ICD-10-CM | POA: Diagnosis not present

## 2018-10-11 DIAGNOSIS — D631 Anemia in chronic kidney disease: Secondary | ICD-10-CM | POA: Diagnosis not present

## 2018-10-11 DIAGNOSIS — Z992 Dependence on renal dialysis: Secondary | ICD-10-CM | POA: Diagnosis not present

## 2018-10-11 DIAGNOSIS — D509 Iron deficiency anemia, unspecified: Secondary | ICD-10-CM | POA: Diagnosis not present

## 2018-10-11 DIAGNOSIS — N2581 Secondary hyperparathyroidism of renal origin: Secondary | ICD-10-CM | POA: Diagnosis not present

## 2018-10-11 DIAGNOSIS — N186 End stage renal disease: Secondary | ICD-10-CM | POA: Diagnosis not present

## 2018-10-14 DIAGNOSIS — N186 End stage renal disease: Secondary | ICD-10-CM | POA: Diagnosis not present

## 2018-10-14 DIAGNOSIS — N2581 Secondary hyperparathyroidism of renal origin: Secondary | ICD-10-CM | POA: Diagnosis not present

## 2018-10-14 DIAGNOSIS — D509 Iron deficiency anemia, unspecified: Secondary | ICD-10-CM | POA: Diagnosis not present

## 2018-10-14 DIAGNOSIS — Z992 Dependence on renal dialysis: Secondary | ICD-10-CM | POA: Diagnosis not present

## 2018-10-14 DIAGNOSIS — D631 Anemia in chronic kidney disease: Secondary | ICD-10-CM | POA: Diagnosis not present

## 2018-10-16 DIAGNOSIS — N2581 Secondary hyperparathyroidism of renal origin: Secondary | ICD-10-CM | POA: Diagnosis not present

## 2018-10-16 DIAGNOSIS — Z992 Dependence on renal dialysis: Secondary | ICD-10-CM | POA: Diagnosis not present

## 2018-10-16 DIAGNOSIS — N186 End stage renal disease: Secondary | ICD-10-CM | POA: Diagnosis not present

## 2018-10-16 DIAGNOSIS — D631 Anemia in chronic kidney disease: Secondary | ICD-10-CM | POA: Diagnosis not present

## 2018-10-16 DIAGNOSIS — D509 Iron deficiency anemia, unspecified: Secondary | ICD-10-CM | POA: Diagnosis not present

## 2018-10-18 DIAGNOSIS — D509 Iron deficiency anemia, unspecified: Secondary | ICD-10-CM | POA: Diagnosis not present

## 2018-10-18 DIAGNOSIS — Z992 Dependence on renal dialysis: Secondary | ICD-10-CM | POA: Diagnosis not present

## 2018-10-18 DIAGNOSIS — N2581 Secondary hyperparathyroidism of renal origin: Secondary | ICD-10-CM | POA: Diagnosis not present

## 2018-10-18 DIAGNOSIS — N186 End stage renal disease: Secondary | ICD-10-CM | POA: Diagnosis not present

## 2018-10-18 DIAGNOSIS — D631 Anemia in chronic kidney disease: Secondary | ICD-10-CM | POA: Diagnosis not present

## 2018-10-21 DIAGNOSIS — N2581 Secondary hyperparathyroidism of renal origin: Secondary | ICD-10-CM | POA: Diagnosis not present

## 2018-10-21 DIAGNOSIS — D631 Anemia in chronic kidney disease: Secondary | ICD-10-CM | POA: Diagnosis not present

## 2018-10-21 DIAGNOSIS — N186 End stage renal disease: Secondary | ICD-10-CM | POA: Diagnosis not present

## 2018-10-21 DIAGNOSIS — Z992 Dependence on renal dialysis: Secondary | ICD-10-CM | POA: Diagnosis not present

## 2018-10-21 DIAGNOSIS — D509 Iron deficiency anemia, unspecified: Secondary | ICD-10-CM | POA: Diagnosis not present

## 2018-10-21 DIAGNOSIS — E119 Type 2 diabetes mellitus without complications: Secondary | ICD-10-CM | POA: Diagnosis not present

## 2018-10-23 DIAGNOSIS — D509 Iron deficiency anemia, unspecified: Secondary | ICD-10-CM | POA: Diagnosis not present

## 2018-10-23 DIAGNOSIS — N186 End stage renal disease: Secondary | ICD-10-CM | POA: Diagnosis not present

## 2018-10-23 DIAGNOSIS — Z992 Dependence on renal dialysis: Secondary | ICD-10-CM | POA: Diagnosis not present

## 2018-10-23 DIAGNOSIS — N2581 Secondary hyperparathyroidism of renal origin: Secondary | ICD-10-CM | POA: Diagnosis not present

## 2018-10-23 DIAGNOSIS — D631 Anemia in chronic kidney disease: Secondary | ICD-10-CM | POA: Diagnosis not present

## 2018-10-25 DIAGNOSIS — N186 End stage renal disease: Secondary | ICD-10-CM | POA: Diagnosis not present

## 2018-10-25 DIAGNOSIS — D509 Iron deficiency anemia, unspecified: Secondary | ICD-10-CM | POA: Diagnosis not present

## 2018-10-25 DIAGNOSIS — Z992 Dependence on renal dialysis: Secondary | ICD-10-CM | POA: Diagnosis not present

## 2018-10-25 DIAGNOSIS — N2581 Secondary hyperparathyroidism of renal origin: Secondary | ICD-10-CM | POA: Diagnosis not present

## 2018-10-25 DIAGNOSIS — D631 Anemia in chronic kidney disease: Secondary | ICD-10-CM | POA: Diagnosis not present

## 2018-10-28 DIAGNOSIS — D631 Anemia in chronic kidney disease: Secondary | ICD-10-CM | POA: Diagnosis not present

## 2018-10-28 DIAGNOSIS — N186 End stage renal disease: Secondary | ICD-10-CM | POA: Diagnosis not present

## 2018-10-28 DIAGNOSIS — D509 Iron deficiency anemia, unspecified: Secondary | ICD-10-CM | POA: Diagnosis not present

## 2018-10-28 DIAGNOSIS — N2581 Secondary hyperparathyroidism of renal origin: Secondary | ICD-10-CM | POA: Diagnosis not present

## 2018-10-28 DIAGNOSIS — Z992 Dependence on renal dialysis: Secondary | ICD-10-CM | POA: Diagnosis not present

## 2018-10-30 DIAGNOSIS — Z992 Dependence on renal dialysis: Secondary | ICD-10-CM | POA: Diagnosis not present

## 2018-10-30 DIAGNOSIS — N2581 Secondary hyperparathyroidism of renal origin: Secondary | ICD-10-CM | POA: Diagnosis not present

## 2018-10-30 DIAGNOSIS — D509 Iron deficiency anemia, unspecified: Secondary | ICD-10-CM | POA: Diagnosis not present

## 2018-10-30 DIAGNOSIS — D631 Anemia in chronic kidney disease: Secondary | ICD-10-CM | POA: Diagnosis not present

## 2018-10-30 DIAGNOSIS — N186 End stage renal disease: Secondary | ICD-10-CM | POA: Diagnosis not present

## 2018-11-01 DIAGNOSIS — D631 Anemia in chronic kidney disease: Secondary | ICD-10-CM | POA: Diagnosis not present

## 2018-11-01 DIAGNOSIS — N2581 Secondary hyperparathyroidism of renal origin: Secondary | ICD-10-CM | POA: Diagnosis not present

## 2018-11-01 DIAGNOSIS — D509 Iron deficiency anemia, unspecified: Secondary | ICD-10-CM | POA: Diagnosis not present

## 2018-11-01 DIAGNOSIS — Z992 Dependence on renal dialysis: Secondary | ICD-10-CM | POA: Diagnosis not present

## 2018-11-01 DIAGNOSIS — N186 End stage renal disease: Secondary | ICD-10-CM | POA: Diagnosis not present

## 2018-11-04 DIAGNOSIS — D509 Iron deficiency anemia, unspecified: Secondary | ICD-10-CM | POA: Diagnosis not present

## 2018-11-04 DIAGNOSIS — N2581 Secondary hyperparathyroidism of renal origin: Secondary | ICD-10-CM | POA: Diagnosis not present

## 2018-11-04 DIAGNOSIS — D631 Anemia in chronic kidney disease: Secondary | ICD-10-CM | POA: Diagnosis not present

## 2018-11-04 DIAGNOSIS — N186 End stage renal disease: Secondary | ICD-10-CM | POA: Diagnosis not present

## 2018-11-04 DIAGNOSIS — Z992 Dependence on renal dialysis: Secondary | ICD-10-CM | POA: Diagnosis not present

## 2018-11-06 DIAGNOSIS — D509 Iron deficiency anemia, unspecified: Secondary | ICD-10-CM | POA: Diagnosis not present

## 2018-11-06 DIAGNOSIS — N186 End stage renal disease: Secondary | ICD-10-CM | POA: Diagnosis not present

## 2018-11-06 DIAGNOSIS — N2581 Secondary hyperparathyroidism of renal origin: Secondary | ICD-10-CM | POA: Diagnosis not present

## 2018-11-06 DIAGNOSIS — D631 Anemia in chronic kidney disease: Secondary | ICD-10-CM | POA: Diagnosis not present

## 2018-11-06 DIAGNOSIS — Z992 Dependence on renal dialysis: Secondary | ICD-10-CM | POA: Diagnosis not present

## 2018-11-07 DIAGNOSIS — Z992 Dependence on renal dialysis: Secondary | ICD-10-CM | POA: Diagnosis not present

## 2018-11-07 DIAGNOSIS — N186 End stage renal disease: Secondary | ICD-10-CM | POA: Diagnosis not present

## 2018-11-08 DIAGNOSIS — N186 End stage renal disease: Secondary | ICD-10-CM | POA: Diagnosis not present

## 2018-11-08 DIAGNOSIS — D509 Iron deficiency anemia, unspecified: Secondary | ICD-10-CM | POA: Diagnosis not present

## 2018-11-08 DIAGNOSIS — N2581 Secondary hyperparathyroidism of renal origin: Secondary | ICD-10-CM | POA: Diagnosis not present

## 2018-11-08 DIAGNOSIS — D631 Anemia in chronic kidney disease: Secondary | ICD-10-CM | POA: Diagnosis not present

## 2018-11-08 DIAGNOSIS — Z992 Dependence on renal dialysis: Secondary | ICD-10-CM | POA: Diagnosis not present

## 2018-11-11 DIAGNOSIS — Z992 Dependence on renal dialysis: Secondary | ICD-10-CM | POA: Diagnosis not present

## 2018-11-11 DIAGNOSIS — D509 Iron deficiency anemia, unspecified: Secondary | ICD-10-CM | POA: Diagnosis not present

## 2018-11-11 DIAGNOSIS — N186 End stage renal disease: Secondary | ICD-10-CM | POA: Diagnosis not present

## 2018-11-11 DIAGNOSIS — N2581 Secondary hyperparathyroidism of renal origin: Secondary | ICD-10-CM | POA: Diagnosis not present

## 2018-11-11 DIAGNOSIS — D631 Anemia in chronic kidney disease: Secondary | ICD-10-CM | POA: Diagnosis not present

## 2018-11-13 DIAGNOSIS — Z992 Dependence on renal dialysis: Secondary | ICD-10-CM | POA: Diagnosis not present

## 2018-11-13 DIAGNOSIS — D631 Anemia in chronic kidney disease: Secondary | ICD-10-CM | POA: Diagnosis not present

## 2018-11-13 DIAGNOSIS — D509 Iron deficiency anemia, unspecified: Secondary | ICD-10-CM | POA: Diagnosis not present

## 2018-11-13 DIAGNOSIS — N2581 Secondary hyperparathyroidism of renal origin: Secondary | ICD-10-CM | POA: Diagnosis not present

## 2018-11-13 DIAGNOSIS — N186 End stage renal disease: Secondary | ICD-10-CM | POA: Diagnosis not present

## 2018-11-15 DIAGNOSIS — D509 Iron deficiency anemia, unspecified: Secondary | ICD-10-CM | POA: Diagnosis not present

## 2018-11-15 DIAGNOSIS — N2581 Secondary hyperparathyroidism of renal origin: Secondary | ICD-10-CM | POA: Diagnosis not present

## 2018-11-15 DIAGNOSIS — Z992 Dependence on renal dialysis: Secondary | ICD-10-CM | POA: Diagnosis not present

## 2018-11-15 DIAGNOSIS — N186 End stage renal disease: Secondary | ICD-10-CM | POA: Diagnosis not present

## 2018-11-15 DIAGNOSIS — D631 Anemia in chronic kidney disease: Secondary | ICD-10-CM | POA: Diagnosis not present

## 2018-11-18 DIAGNOSIS — N186 End stage renal disease: Secondary | ICD-10-CM | POA: Diagnosis not present

## 2018-11-18 DIAGNOSIS — D631 Anemia in chronic kidney disease: Secondary | ICD-10-CM | POA: Diagnosis not present

## 2018-11-18 DIAGNOSIS — N2581 Secondary hyperparathyroidism of renal origin: Secondary | ICD-10-CM | POA: Diagnosis not present

## 2018-11-18 DIAGNOSIS — D509 Iron deficiency anemia, unspecified: Secondary | ICD-10-CM | POA: Diagnosis not present

## 2018-11-18 DIAGNOSIS — Z992 Dependence on renal dialysis: Secondary | ICD-10-CM | POA: Diagnosis not present

## 2018-11-19 ENCOUNTER — Other Ambulatory Visit: Payer: Self-pay

## 2018-11-19 ENCOUNTER — Encounter: Payer: Self-pay | Admitting: Family Medicine

## 2018-11-19 ENCOUNTER — Ambulatory Visit (INDEPENDENT_AMBULATORY_CARE_PROVIDER_SITE_OTHER): Payer: Medicare Other | Admitting: Family Medicine

## 2018-11-19 DIAGNOSIS — I70213 Atherosclerosis of native arteries of extremities with intermittent claudication, bilateral legs: Secondary | ICD-10-CM

## 2018-11-19 DIAGNOSIS — I5032 Chronic diastolic (congestive) heart failure: Secondary | ICD-10-CM

## 2018-11-19 DIAGNOSIS — I10 Essential (primary) hypertension: Secondary | ICD-10-CM | POA: Diagnosis not present

## 2018-11-19 DIAGNOSIS — J449 Chronic obstructive pulmonary disease, unspecified: Secondary | ICD-10-CM | POA: Diagnosis not present

## 2018-11-19 DIAGNOSIS — E1165 Type 2 diabetes mellitus with hyperglycemia: Secondary | ICD-10-CM | POA: Diagnosis not present

## 2018-11-19 DIAGNOSIS — Z992 Dependence on renal dialysis: Secondary | ICD-10-CM | POA: Insufficient documentation

## 2018-11-19 MED ORDER — AMLODIPINE BESYLATE 5 MG PO TABS: 5.0000 mg | ORAL_TABLET | Freq: Every day | ORAL | 1 refills | Status: DC

## 2018-11-19 MED ORDER — INSULIN DEGLUDEC 100 UNIT/ML ~~LOC~~ SOPN: 12.0000 [IU] | PEN_INJECTOR | Freq: Every day | SUBCUTANEOUS | 2 refills | Status: DC

## 2018-11-19 MED ORDER — ALBUTEROL SULFATE HFA 108 (90 BASE) MCG/ACT IN AERS: 2.0000 | INHALATION_SPRAY | Freq: Four times a day (QID) | RESPIRATORY_TRACT | 0 refills | Status: DC | PRN

## 2018-11-19 MED ORDER — UMECLIDINIUM-VILANTEROL 62.5-25 MCG/INH IN AEPB: 1.0000 | INHALATION_SPRAY | Freq: Every day | RESPIRATORY_TRACT | 3 refills | Status: DC

## 2018-11-19 MED ORDER — LOSARTAN POTASSIUM 100 MG PO TABS: 100.0000 mg | ORAL_TABLET | Freq: Every day | ORAL | 1 refills | Status: DC

## 2018-11-19 NOTE — Progress Notes (Signed)
There were no vitals taken for this visit.   Subjective:    Patient ID: Timothy Gibson, male    DOB: July 04, 1951, 68 y.o.   MRN: [id number]  HPI: Timothy Gibson is a 68 y.o. male  Chief Complaint  Patient presents with  . Follow-up  . Diabetes    . This visit was completed via telephone due to the restrictions of the COVID-19 pandemic. All issues as above were discussed and addressed but no physical exam was performed. If it was felt that the patient should be evaluated in the office, they were directed there. The patient verbally consented to this visit. Patient was unable to complete an audio/visual visit due to Technical difficulties,Lack of internet. Due to the catastrophic nature of the COVID-19 pandemic, this visit was done through audio contact only. . Location of the patient: home . Location of the provider: home . Those involved with this call:  . Provider: Merrie Roof, PA-C . CMA: Gerda Diss, CMA . Front Desk/Registration: Jill Side  . Time spent on call: 25 minutes on the phone discussing health concerns. 10 minutes total spent in review of patient's record and preparation of their chart. I verified patient identity using two factors (patient name and date of birth). Patient consents verbally to being seen via telemedicine visit today.   Presenting today for 3 month f/u chronic conditions.   Notes the addition of anoro has almost completely resolved his wheezing and chronic cough issues. Doing very well on that with albuterol rescue on hand if needed. Denies any recent exacerbations or new concerns.   BPs 120-130/80s when checked. Gets them monitored frequently at dialysis. Sometimes dropping too low at the end of dialysis but otherwise stable. Denies dizziness, HAs, CP, SOB.   Doing 12 units of tresiba once a day. Does not check home sugars but does get frequent labs drawn at dialysis and states his recent A1C was below 6. No low blood sugar spells noted.    Followed by Cardiology for possible HF diagnosis. Unclear at this time if a true diagnosis for him.   Relevant past medical, surgical, family and social history reviewed and updated as indicated. Interim medical history since our last visit reviewed. Allergies and medications reviewed and updated.  Review of Systems  Per HPI unless specifically indicated above     Objective:    There were no vitals taken for this visit.  Wt Readings from Last 3 Encounters:  08/15/18 243 lb (110.2 kg)  06/27/18 245 lb 9.6 oz (111.4 kg)  06/04/18 253 lb 8.5 oz (115 kg)    Physical Exam  Unable to perform PE today as there were technical difficulties with video technology for visit.   Results for orders placed or performed in visit on 08/15/18  Comprehensive metabolic panel  Result Value Ref Range   Glucose 137 (H) 65 - 99 mg/dL   BUN 36 (H) 8 - 27 mg/dL   Creatinine, Ser 9.77 (H) 0.76 - 1.27 mg/dL   GFR calc non Af Amer 5 (L) >59 mL/min/1.73   GFR calc Af Amer 6 (L) >59 mL/min/1.73   BUN/Creatinine Ratio 4 (L) 10 - 24   Sodium 143 134 - 144 mmol/L   Potassium 4.2 3.5 - 5.2 mmol/L   Chloride 96 96 - 106 mmol/L   CO2 29 20 - 29 mmol/L   Calcium 8.9 8.6 - 10.2 mg/dL   Total Protein 7.0 6.0 - 8.5 g/dL   Albumin 4.0 3.8 - 4.8  g/dL   Globulin, Total 3.0 1.5 - 4.5 g/dL   Albumin/Globulin Ratio 1.3 1.2 - 2.2   Bilirubin Total 0.4 0.0 - 1.2 mg/dL   Alkaline Phosphatase 88 39 - 117 IU/L   AST 15 0 - 40 IU/L   ALT 10 0 - 44 IU/L  Lipid Panel w/o Chol/HDL Ratio  Result Value Ref Range   Cholesterol, Total 103 100 - 199 mg/dL   Triglycerides 84 0 - 149 mg/dL   HDL 35 (L) >39 mg/dL   VLDL Cholesterol Cal 17 5 - 40 mg/dL   LDL Calculated 51 0 - 99 mg/dL  HgB A1c  Result Value Ref Range   Hgb A1c MFr Bld 7.6 (H) 4.8 - 5.6 %   Est. average glucose Bld gHb Est-mCnc 171 mg/dL      Assessment & Plan:   Problem List Items Addressed This Visit      Cardiovascular and Mediastinum   Essential  hypertension - Primary    Stable and under good control, continue current regimen. Continue to monitor at home, call with persistent abnormal readings      Relevant Medications   EPINEPHrine 0.3 mg/0.3 mL IJ SOAJ injection   amLODipine (NORVASC) 5 MG tablet   losartan (COZAAR) 100 MG tablet   Chronic diastolic heart failure ([place])    Per record review, followed now by Cardiology. Continue per their instruction      Relevant Medications   EPINEPHrine 0.3 mg/0.3 mL IJ SOAJ injection   amLODipine (NORVASC) 5 MG tablet   losartan (COZAAR) 100 MG tablet     Respiratory   COPD (chronic obstructive pulmonary disease) (HCC)    Significant improvement on anoro. Continue inhaler regimen and work on reducing cigars      Relevant Medications   umeclidinium-vilanterol (ANORO ELLIPTA) 62.5-25 MCG/INH AEPB   albuterol (VENTOLIN HFA) 108 (90 Base) MCG/ACT inhaler     Endocrine   Type 2 diabetes mellitus with hyperglycemia ([place])    Per patient, A1C under excellent control. Will await records from dialysis to keep on file. If unable to obtain, will draw new labs. Patient fairly adamant against this since he gets so much blood drawn regularly at dialysis. Strongly recommended daily BS checks given use of insulin. Continue current regimen      Relevant Medications   insulin degludec (TRESIBA) 100 UNIT/ML SOPN FlexTouch Pen   losartan (COZAAR) 100 MG tablet       Follow up plan: Return in about 3 months (around 02/19/2019) for DM.

## 2018-11-20 DIAGNOSIS — D631 Anemia in chronic kidney disease: Secondary | ICD-10-CM | POA: Diagnosis not present

## 2018-11-20 DIAGNOSIS — Z992 Dependence on renal dialysis: Secondary | ICD-10-CM | POA: Diagnosis not present

## 2018-11-20 DIAGNOSIS — D509 Iron deficiency anemia, unspecified: Secondary | ICD-10-CM | POA: Diagnosis not present

## 2018-11-20 DIAGNOSIS — N2581 Secondary hyperparathyroidism of renal origin: Secondary | ICD-10-CM | POA: Diagnosis not present

## 2018-11-20 DIAGNOSIS — N186 End stage renal disease: Secondary | ICD-10-CM | POA: Diagnosis not present

## 2018-11-22 DIAGNOSIS — N186 End stage renal disease: Secondary | ICD-10-CM | POA: Diagnosis not present

## 2018-11-22 DIAGNOSIS — Z992 Dependence on renal dialysis: Secondary | ICD-10-CM | POA: Diagnosis not present

## 2018-11-22 DIAGNOSIS — N2581 Secondary hyperparathyroidism of renal origin: Secondary | ICD-10-CM | POA: Diagnosis not present

## 2018-11-22 DIAGNOSIS — D509 Iron deficiency anemia, unspecified: Secondary | ICD-10-CM | POA: Diagnosis not present

## 2018-11-22 DIAGNOSIS — D631 Anemia in chronic kidney disease: Secondary | ICD-10-CM | POA: Diagnosis not present

## 2018-11-22 NOTE — Assessment & Plan Note (Signed)
Per patient, A1C under excellent control. Will await records from dialysis to keep on file. If unable to obtain, will draw new labs. Patient fairly adamant against this since he gets so much blood drawn regularly at dialysis. Strongly recommended daily BS checks given use of insulin. Continue current regimen

## 2018-11-22 NOTE — Assessment & Plan Note (Signed)
Significant improvement on anoro. Continue inhaler regimen and work on reducing cigars

## 2018-11-22 NOTE — Assessment & Plan Note (Signed)
Per record review, followed now by Cardiology. Continue per their instruction

## 2018-11-22 NOTE — Assessment & Plan Note (Signed)
Stable and under good control, continue current regimen. Continue to monitor at home, call with persistent abnormal readings

## 2018-11-25 DIAGNOSIS — D631 Anemia in chronic kidney disease: Secondary | ICD-10-CM | POA: Diagnosis not present

## 2018-11-25 DIAGNOSIS — N2581 Secondary hyperparathyroidism of renal origin: Secondary | ICD-10-CM | POA: Diagnosis not present

## 2018-11-25 DIAGNOSIS — Z992 Dependence on renal dialysis: Secondary | ICD-10-CM | POA: Diagnosis not present

## 2018-11-25 DIAGNOSIS — N186 End stage renal disease: Secondary | ICD-10-CM | POA: Diagnosis not present

## 2018-11-25 DIAGNOSIS — D509 Iron deficiency anemia, unspecified: Secondary | ICD-10-CM | POA: Diagnosis not present

## 2018-11-26 LAB — HEMOGLOBIN A1C: Hemoglobin A1C: 6.9

## 2018-11-27 DIAGNOSIS — N2581 Secondary hyperparathyroidism of renal origin: Secondary | ICD-10-CM | POA: Diagnosis not present

## 2018-11-27 DIAGNOSIS — D509 Iron deficiency anemia, unspecified: Secondary | ICD-10-CM | POA: Diagnosis not present

## 2018-11-27 DIAGNOSIS — Z992 Dependence on renal dialysis: Secondary | ICD-10-CM | POA: Diagnosis not present

## 2018-11-27 DIAGNOSIS — N186 End stage renal disease: Secondary | ICD-10-CM | POA: Diagnosis not present

## 2018-11-27 DIAGNOSIS — D631 Anemia in chronic kidney disease: Secondary | ICD-10-CM | POA: Diagnosis not present

## 2018-11-28 DIAGNOSIS — B351 Tinea unguium: Secondary | ICD-10-CM | POA: Diagnosis not present

## 2018-11-28 DIAGNOSIS — E1140 Type 2 diabetes mellitus with diabetic neuropathy, unspecified: Secondary | ICD-10-CM | POA: Diagnosis not present

## 2018-11-28 DIAGNOSIS — Z794 Long term (current) use of insulin: Secondary | ICD-10-CM | POA: Diagnosis not present

## 2018-11-29 DIAGNOSIS — N2581 Secondary hyperparathyroidism of renal origin: Secondary | ICD-10-CM | POA: Diagnosis not present

## 2018-11-29 DIAGNOSIS — D631 Anemia in chronic kidney disease: Secondary | ICD-10-CM | POA: Diagnosis not present

## 2018-11-29 DIAGNOSIS — Z992 Dependence on renal dialysis: Secondary | ICD-10-CM | POA: Diagnosis not present

## 2018-11-29 DIAGNOSIS — N186 End stage renal disease: Secondary | ICD-10-CM | POA: Diagnosis not present

## 2018-11-29 DIAGNOSIS — D509 Iron deficiency anemia, unspecified: Secondary | ICD-10-CM | POA: Diagnosis not present

## 2018-12-02 DIAGNOSIS — N2581 Secondary hyperparathyroidism of renal origin: Secondary | ICD-10-CM | POA: Diagnosis not present

## 2018-12-02 DIAGNOSIS — N186 End stage renal disease: Secondary | ICD-10-CM | POA: Diagnosis not present

## 2018-12-02 DIAGNOSIS — D509 Iron deficiency anemia, unspecified: Secondary | ICD-10-CM | POA: Diagnosis not present

## 2018-12-02 DIAGNOSIS — D631 Anemia in chronic kidney disease: Secondary | ICD-10-CM | POA: Diagnosis not present

## 2018-12-02 DIAGNOSIS — Z992 Dependence on renal dialysis: Secondary | ICD-10-CM | POA: Diagnosis not present

## 2018-12-04 DIAGNOSIS — D631 Anemia in chronic kidney disease: Secondary | ICD-10-CM | POA: Diagnosis not present

## 2018-12-04 DIAGNOSIS — N186 End stage renal disease: Secondary | ICD-10-CM | POA: Diagnosis not present

## 2018-12-04 DIAGNOSIS — Z992 Dependence on renal dialysis: Secondary | ICD-10-CM | POA: Diagnosis not present

## 2018-12-04 DIAGNOSIS — D509 Iron deficiency anemia, unspecified: Secondary | ICD-10-CM | POA: Diagnosis not present

## 2018-12-04 DIAGNOSIS — N2581 Secondary hyperparathyroidism of renal origin: Secondary | ICD-10-CM | POA: Diagnosis not present

## 2018-12-06 DIAGNOSIS — N2581 Secondary hyperparathyroidism of renal origin: Secondary | ICD-10-CM | POA: Diagnosis not present

## 2018-12-06 DIAGNOSIS — N186 End stage renal disease: Secondary | ICD-10-CM | POA: Diagnosis not present

## 2018-12-06 DIAGNOSIS — D631 Anemia in chronic kidney disease: Secondary | ICD-10-CM | POA: Diagnosis not present

## 2018-12-06 DIAGNOSIS — D509 Iron deficiency anemia, unspecified: Secondary | ICD-10-CM | POA: Diagnosis not present

## 2018-12-06 DIAGNOSIS — Z992 Dependence on renal dialysis: Secondary | ICD-10-CM | POA: Diagnosis not present

## 2018-12-08 DIAGNOSIS — N186 End stage renal disease: Secondary | ICD-10-CM | POA: Diagnosis not present

## 2018-12-08 DIAGNOSIS — Z992 Dependence on renal dialysis: Secondary | ICD-10-CM | POA: Diagnosis not present

## 2018-12-09 DIAGNOSIS — N2581 Secondary hyperparathyroidism of renal origin: Secondary | ICD-10-CM | POA: Diagnosis not present

## 2018-12-09 DIAGNOSIS — D509 Iron deficiency anemia, unspecified: Secondary | ICD-10-CM | POA: Diagnosis not present

## 2018-12-09 DIAGNOSIS — D631 Anemia in chronic kidney disease: Secondary | ICD-10-CM | POA: Diagnosis not present

## 2018-12-09 DIAGNOSIS — Z992 Dependence on renal dialysis: Secondary | ICD-10-CM | POA: Diagnosis not present

## 2018-12-09 DIAGNOSIS — N186 End stage renal disease: Secondary | ICD-10-CM | POA: Diagnosis not present

## 2018-12-11 DIAGNOSIS — D509 Iron deficiency anemia, unspecified: Secondary | ICD-10-CM | POA: Diagnosis not present

## 2018-12-11 DIAGNOSIS — Z992 Dependence on renal dialysis: Secondary | ICD-10-CM | POA: Diagnosis not present

## 2018-12-11 DIAGNOSIS — N2581 Secondary hyperparathyroidism of renal origin: Secondary | ICD-10-CM | POA: Diagnosis not present

## 2018-12-11 DIAGNOSIS — D631 Anemia in chronic kidney disease: Secondary | ICD-10-CM | POA: Diagnosis not present

## 2018-12-11 DIAGNOSIS — N186 End stage renal disease: Secondary | ICD-10-CM | POA: Diagnosis not present

## 2018-12-13 DIAGNOSIS — Z992 Dependence on renal dialysis: Secondary | ICD-10-CM | POA: Diagnosis not present

## 2018-12-13 DIAGNOSIS — D631 Anemia in chronic kidney disease: Secondary | ICD-10-CM | POA: Diagnosis not present

## 2018-12-13 DIAGNOSIS — N186 End stage renal disease: Secondary | ICD-10-CM | POA: Diagnosis not present

## 2018-12-13 DIAGNOSIS — D509 Iron deficiency anemia, unspecified: Secondary | ICD-10-CM | POA: Diagnosis not present

## 2018-12-13 DIAGNOSIS — N2581 Secondary hyperparathyroidism of renal origin: Secondary | ICD-10-CM | POA: Diagnosis not present

## 2018-12-16 DIAGNOSIS — D631 Anemia in chronic kidney disease: Secondary | ICD-10-CM | POA: Diagnosis not present

## 2018-12-16 DIAGNOSIS — N186 End stage renal disease: Secondary | ICD-10-CM | POA: Diagnosis not present

## 2018-12-16 DIAGNOSIS — Z992 Dependence on renal dialysis: Secondary | ICD-10-CM | POA: Diagnosis not present

## 2018-12-16 DIAGNOSIS — D509 Iron deficiency anemia, unspecified: Secondary | ICD-10-CM | POA: Diagnosis not present

## 2018-12-16 DIAGNOSIS — N2581 Secondary hyperparathyroidism of renal origin: Secondary | ICD-10-CM | POA: Diagnosis not present

## 2018-12-18 DIAGNOSIS — D509 Iron deficiency anemia, unspecified: Secondary | ICD-10-CM | POA: Diagnosis not present

## 2018-12-18 DIAGNOSIS — N2581 Secondary hyperparathyroidism of renal origin: Secondary | ICD-10-CM | POA: Diagnosis not present

## 2018-12-18 DIAGNOSIS — Z992 Dependence on renal dialysis: Secondary | ICD-10-CM | POA: Diagnosis not present

## 2018-12-18 DIAGNOSIS — D631 Anemia in chronic kidney disease: Secondary | ICD-10-CM | POA: Diagnosis not present

## 2018-12-18 DIAGNOSIS — N186 End stage renal disease: Secondary | ICD-10-CM | POA: Diagnosis not present

## 2018-12-20 DIAGNOSIS — D631 Anemia in chronic kidney disease: Secondary | ICD-10-CM | POA: Diagnosis not present

## 2018-12-20 DIAGNOSIS — N2581 Secondary hyperparathyroidism of renal origin: Secondary | ICD-10-CM | POA: Diagnosis not present

## 2018-12-20 DIAGNOSIS — N186 End stage renal disease: Secondary | ICD-10-CM | POA: Diagnosis not present

## 2018-12-20 DIAGNOSIS — D509 Iron deficiency anemia, unspecified: Secondary | ICD-10-CM | POA: Diagnosis not present

## 2018-12-20 DIAGNOSIS — Z992 Dependence on renal dialysis: Secondary | ICD-10-CM | POA: Diagnosis not present

## 2018-12-23 DIAGNOSIS — D509 Iron deficiency anemia, unspecified: Secondary | ICD-10-CM | POA: Diagnosis not present

## 2018-12-23 DIAGNOSIS — N2581 Secondary hyperparathyroidism of renal origin: Secondary | ICD-10-CM | POA: Diagnosis not present

## 2018-12-23 DIAGNOSIS — Z992 Dependence on renal dialysis: Secondary | ICD-10-CM | POA: Diagnosis not present

## 2018-12-23 DIAGNOSIS — N186 End stage renal disease: Secondary | ICD-10-CM | POA: Diagnosis not present

## 2018-12-23 DIAGNOSIS — D631 Anemia in chronic kidney disease: Secondary | ICD-10-CM | POA: Diagnosis not present

## 2018-12-25 DIAGNOSIS — N186 End stage renal disease: Secondary | ICD-10-CM | POA: Diagnosis not present

## 2018-12-25 DIAGNOSIS — Z992 Dependence on renal dialysis: Secondary | ICD-10-CM | POA: Diagnosis not present

## 2018-12-25 DIAGNOSIS — D631 Anemia in chronic kidney disease: Secondary | ICD-10-CM | POA: Diagnosis not present

## 2018-12-25 DIAGNOSIS — N2581 Secondary hyperparathyroidism of renal origin: Secondary | ICD-10-CM | POA: Diagnosis not present

## 2018-12-25 DIAGNOSIS — D509 Iron deficiency anemia, unspecified: Secondary | ICD-10-CM | POA: Diagnosis not present

## 2018-12-27 DIAGNOSIS — D509 Iron deficiency anemia, unspecified: Secondary | ICD-10-CM | POA: Diagnosis not present

## 2018-12-27 DIAGNOSIS — N186 End stage renal disease: Secondary | ICD-10-CM | POA: Diagnosis not present

## 2018-12-27 DIAGNOSIS — N2581 Secondary hyperparathyroidism of renal origin: Secondary | ICD-10-CM | POA: Diagnosis not present

## 2018-12-27 DIAGNOSIS — Z992 Dependence on renal dialysis: Secondary | ICD-10-CM | POA: Diagnosis not present

## 2018-12-27 DIAGNOSIS — D631 Anemia in chronic kidney disease: Secondary | ICD-10-CM | POA: Diagnosis not present

## 2018-12-30 DIAGNOSIS — N2581 Secondary hyperparathyroidism of renal origin: Secondary | ICD-10-CM | POA: Diagnosis not present

## 2018-12-30 DIAGNOSIS — Z992 Dependence on renal dialysis: Secondary | ICD-10-CM | POA: Diagnosis not present

## 2018-12-30 DIAGNOSIS — D509 Iron deficiency anemia, unspecified: Secondary | ICD-10-CM | POA: Diagnosis not present

## 2018-12-30 DIAGNOSIS — D631 Anemia in chronic kidney disease: Secondary | ICD-10-CM | POA: Diagnosis not present

## 2018-12-30 DIAGNOSIS — N186 End stage renal disease: Secondary | ICD-10-CM | POA: Diagnosis not present

## 2018-12-31 ENCOUNTER — Ambulatory Visit (INDEPENDENT_AMBULATORY_CARE_PROVIDER_SITE_OTHER): Payer: Medicare Other | Admitting: Nurse Practitioner

## 2018-12-31 ENCOUNTER — Encounter (INDEPENDENT_AMBULATORY_CARE_PROVIDER_SITE_OTHER): Payer: Medicare Other

## 2019-01-01 DIAGNOSIS — D631 Anemia in chronic kidney disease: Secondary | ICD-10-CM | POA: Diagnosis not present

## 2019-01-01 DIAGNOSIS — D509 Iron deficiency anemia, unspecified: Secondary | ICD-10-CM | POA: Diagnosis not present

## 2019-01-01 DIAGNOSIS — N186 End stage renal disease: Secondary | ICD-10-CM | POA: Diagnosis not present

## 2019-01-01 DIAGNOSIS — N2581 Secondary hyperparathyroidism of renal origin: Secondary | ICD-10-CM | POA: Diagnosis not present

## 2019-01-01 DIAGNOSIS — Z992 Dependence on renal dialysis: Secondary | ICD-10-CM | POA: Diagnosis not present

## 2019-01-03 DIAGNOSIS — N2581 Secondary hyperparathyroidism of renal origin: Secondary | ICD-10-CM | POA: Diagnosis not present

## 2019-01-03 DIAGNOSIS — N186 End stage renal disease: Secondary | ICD-10-CM | POA: Diagnosis not present

## 2019-01-03 DIAGNOSIS — Z992 Dependence on renal dialysis: Secondary | ICD-10-CM | POA: Diagnosis not present

## 2019-01-03 DIAGNOSIS — D631 Anemia in chronic kidney disease: Secondary | ICD-10-CM | POA: Diagnosis not present

## 2019-01-03 DIAGNOSIS — D509 Iron deficiency anemia, unspecified: Secondary | ICD-10-CM | POA: Diagnosis not present

## 2019-01-06 DIAGNOSIS — D631 Anemia in chronic kidney disease: Secondary | ICD-10-CM | POA: Diagnosis not present

## 2019-01-06 DIAGNOSIS — Z992 Dependence on renal dialysis: Secondary | ICD-10-CM | POA: Diagnosis not present

## 2019-01-06 DIAGNOSIS — D509 Iron deficiency anemia, unspecified: Secondary | ICD-10-CM | POA: Diagnosis not present

## 2019-01-06 DIAGNOSIS — N186 End stage renal disease: Secondary | ICD-10-CM | POA: Diagnosis not present

## 2019-01-06 DIAGNOSIS — N2581 Secondary hyperparathyroidism of renal origin: Secondary | ICD-10-CM | POA: Diagnosis not present

## 2019-01-07 DIAGNOSIS — N186 End stage renal disease: Secondary | ICD-10-CM | POA: Diagnosis not present

## 2019-01-07 DIAGNOSIS — Z992 Dependence on renal dialysis: Secondary | ICD-10-CM | POA: Diagnosis not present

## 2019-01-08 DIAGNOSIS — D509 Iron deficiency anemia, unspecified: Secondary | ICD-10-CM | POA: Diagnosis not present

## 2019-01-08 DIAGNOSIS — N186 End stage renal disease: Secondary | ICD-10-CM | POA: Diagnosis not present

## 2019-01-08 DIAGNOSIS — D631 Anemia in chronic kidney disease: Secondary | ICD-10-CM | POA: Diagnosis not present

## 2019-01-08 DIAGNOSIS — Z992 Dependence on renal dialysis: Secondary | ICD-10-CM | POA: Diagnosis not present

## 2019-01-08 DIAGNOSIS — N2581 Secondary hyperparathyroidism of renal origin: Secondary | ICD-10-CM | POA: Diagnosis not present

## 2019-01-10 DIAGNOSIS — D631 Anemia in chronic kidney disease: Secondary | ICD-10-CM | POA: Diagnosis not present

## 2019-01-10 DIAGNOSIS — D509 Iron deficiency anemia, unspecified: Secondary | ICD-10-CM | POA: Diagnosis not present

## 2019-01-10 DIAGNOSIS — N2581 Secondary hyperparathyroidism of renal origin: Secondary | ICD-10-CM | POA: Diagnosis not present

## 2019-01-10 DIAGNOSIS — Z992 Dependence on renal dialysis: Secondary | ICD-10-CM | POA: Diagnosis not present

## 2019-01-10 DIAGNOSIS — N186 End stage renal disease: Secondary | ICD-10-CM | POA: Diagnosis not present

## 2019-01-13 DIAGNOSIS — D631 Anemia in chronic kidney disease: Secondary | ICD-10-CM | POA: Diagnosis not present

## 2019-01-13 DIAGNOSIS — D509 Iron deficiency anemia, unspecified: Secondary | ICD-10-CM | POA: Diagnosis not present

## 2019-01-13 DIAGNOSIS — Z992 Dependence on renal dialysis: Secondary | ICD-10-CM | POA: Diagnosis not present

## 2019-01-13 DIAGNOSIS — N186 End stage renal disease: Secondary | ICD-10-CM | POA: Diagnosis not present

## 2019-01-13 DIAGNOSIS — N2581 Secondary hyperparathyroidism of renal origin: Secondary | ICD-10-CM | POA: Diagnosis not present

## 2019-01-15 DIAGNOSIS — N2581 Secondary hyperparathyroidism of renal origin: Secondary | ICD-10-CM | POA: Diagnosis not present

## 2019-01-15 DIAGNOSIS — D509 Iron deficiency anemia, unspecified: Secondary | ICD-10-CM | POA: Diagnosis not present

## 2019-01-15 DIAGNOSIS — Z992 Dependence on renal dialysis: Secondary | ICD-10-CM | POA: Diagnosis not present

## 2019-01-15 DIAGNOSIS — N186 End stage renal disease: Secondary | ICD-10-CM | POA: Diagnosis not present

## 2019-01-15 DIAGNOSIS — D631 Anemia in chronic kidney disease: Secondary | ICD-10-CM | POA: Diagnosis not present

## 2019-01-17 DIAGNOSIS — D631 Anemia in chronic kidney disease: Secondary | ICD-10-CM | POA: Diagnosis not present

## 2019-01-17 DIAGNOSIS — Z992 Dependence on renal dialysis: Secondary | ICD-10-CM | POA: Diagnosis not present

## 2019-01-17 DIAGNOSIS — D509 Iron deficiency anemia, unspecified: Secondary | ICD-10-CM | POA: Diagnosis not present

## 2019-01-17 DIAGNOSIS — N2581 Secondary hyperparathyroidism of renal origin: Secondary | ICD-10-CM | POA: Diagnosis not present

## 2019-01-17 DIAGNOSIS — N186 End stage renal disease: Secondary | ICD-10-CM | POA: Diagnosis not present

## 2019-01-20 DIAGNOSIS — N2581 Secondary hyperparathyroidism of renal origin: Secondary | ICD-10-CM | POA: Diagnosis not present

## 2019-01-20 DIAGNOSIS — D509 Iron deficiency anemia, unspecified: Secondary | ICD-10-CM | POA: Diagnosis not present

## 2019-01-20 DIAGNOSIS — N186 End stage renal disease: Secondary | ICD-10-CM | POA: Diagnosis not present

## 2019-01-20 DIAGNOSIS — D631 Anemia in chronic kidney disease: Secondary | ICD-10-CM | POA: Diagnosis not present

## 2019-01-20 DIAGNOSIS — Z992 Dependence on renal dialysis: Secondary | ICD-10-CM | POA: Diagnosis not present

## 2019-01-22 DIAGNOSIS — N186 End stage renal disease: Secondary | ICD-10-CM | POA: Diagnosis not present

## 2019-01-22 DIAGNOSIS — E119 Type 2 diabetes mellitus without complications: Secondary | ICD-10-CM | POA: Diagnosis not present

## 2019-01-22 DIAGNOSIS — N2581 Secondary hyperparathyroidism of renal origin: Secondary | ICD-10-CM | POA: Diagnosis not present

## 2019-01-22 DIAGNOSIS — D631 Anemia in chronic kidney disease: Secondary | ICD-10-CM | POA: Diagnosis not present

## 2019-01-22 DIAGNOSIS — D509 Iron deficiency anemia, unspecified: Secondary | ICD-10-CM | POA: Diagnosis not present

## 2019-01-22 DIAGNOSIS — Z992 Dependence on renal dialysis: Secondary | ICD-10-CM | POA: Diagnosis not present

## 2019-01-24 DIAGNOSIS — N186 End stage renal disease: Secondary | ICD-10-CM | POA: Diagnosis not present

## 2019-01-24 DIAGNOSIS — N2581 Secondary hyperparathyroidism of renal origin: Secondary | ICD-10-CM | POA: Diagnosis not present

## 2019-01-24 DIAGNOSIS — D509 Iron deficiency anemia, unspecified: Secondary | ICD-10-CM | POA: Diagnosis not present

## 2019-01-24 DIAGNOSIS — Z992 Dependence on renal dialysis: Secondary | ICD-10-CM | POA: Diagnosis not present

## 2019-01-24 DIAGNOSIS — D631 Anemia in chronic kidney disease: Secondary | ICD-10-CM | POA: Diagnosis not present

## 2019-01-27 DIAGNOSIS — D509 Iron deficiency anemia, unspecified: Secondary | ICD-10-CM | POA: Diagnosis not present

## 2019-01-27 DIAGNOSIS — D631 Anemia in chronic kidney disease: Secondary | ICD-10-CM | POA: Diagnosis not present

## 2019-01-27 DIAGNOSIS — N2581 Secondary hyperparathyroidism of renal origin: Secondary | ICD-10-CM | POA: Diagnosis not present

## 2019-01-27 DIAGNOSIS — Z992 Dependence on renal dialysis: Secondary | ICD-10-CM | POA: Diagnosis not present

## 2019-01-27 DIAGNOSIS — N186 End stage renal disease: Secondary | ICD-10-CM | POA: Diagnosis not present

## 2019-01-28 ENCOUNTER — Other Ambulatory Visit: Payer: Self-pay

## 2019-01-28 ENCOUNTER — Ambulatory Visit (INDEPENDENT_AMBULATORY_CARE_PROVIDER_SITE_OTHER): Payer: Medicare Other | Admitting: Family Medicine

## 2019-01-28 ENCOUNTER — Encounter: Payer: Self-pay | Admitting: Family Medicine

## 2019-01-28 VITALS — BP 189/96 | HR 76 | Temp 98.5°F

## 2019-01-28 DIAGNOSIS — L03031 Cellulitis of right toe: Secondary | ICD-10-CM | POA: Diagnosis not present

## 2019-01-28 DIAGNOSIS — L97511 Non-pressure chronic ulcer of other part of right foot limited to breakdown of skin: Secondary | ICD-10-CM

## 2019-01-28 DIAGNOSIS — I70213 Atherosclerosis of native arteries of extremities with intermittent claudication, bilateral legs: Secondary | ICD-10-CM | POA: Diagnosis not present

## 2019-01-28 MED ORDER — SULFAMETHOXAZOLE-TRIMETHOPRIM 200-40 MG/5ML PO SUSP: 14.5000 mL | Freq: Every day | ORAL | 0 refills | Status: AC

## 2019-01-28 NOTE — Progress Notes (Signed)
BP (!) 189/96   Pulse 76   Temp 98.5 F (36.9 C) (Oral)   SpO2 100%    Subjective:    Patient ID: Timothy Gibson, male    DOB: [birth year], 68 y.o.   MRN: [id number]  HPI: Timothy Gibson is a 68 y.o. male  Chief Complaint  Patient presents with  . Ulcer    L big toe, states it is draining   Timothy Gibson presents today with a spot on his toe. He notes that it has been red and swollen and tender. He notes that he had to go to wound care last time. He notes that he has had no fevers, no chills. He is otherwise feeling well with no other concerns or complaints at this time.   Relevant past medical, surgical, family and social history reviewed and updated as indicated. Interim medical history since our last visit reviewed. Allergies and medications reviewed and updated.  Review of Systems  Constitutional: Negative.   Respiratory: Negative.   Cardiovascular: Negative.   Gastrointestinal: Negative.   Musculoskeletal: Negative.   Skin: Positive for wound. Negative for color change, pallor and rash.  Psychiatric/Behavioral: Negative.     Per HPI unless specifically indicated above     Objective:    BP (!) 189/96   Pulse 76   Temp 98.5 F (36.9 C) (Oral)   SpO2 100%   Wt Readings from Last 3 Encounters:  08/15/18 243 lb (110.2 kg)  06/27/18 245 lb 9.6 oz (111.4 kg)  06/04/18 253 lb 8.5 oz (115 kg)    Physical Exam Vitals signs and nursing note reviewed.  Constitutional:      General: He is not in acute distress.    Appearance: Normal appearance. He is not ill-appearing, toxic-appearing or diaphoretic.  HENT:     Head: Normocephalic and atraumatic.     Right Ear: External ear normal.     Left Ear: External ear normal.     Nose: Nose normal.     Mouth/Throat:     Mouth: Mucous membranes are moist.     Pharynx: Oropharynx is clear.  Eyes:     General: No scleral icterus.       Right eye: No discharge.        Left eye: No discharge.     Extraocular Movements:  Extraocular movements intact.     Conjunctiva/sclera: Conjunctivae normal.     Pupils: Pupils are equal, round, and reactive to light.  Neck:     Musculoskeletal: Normal range of motion and neck supple.  Cardiovascular:     Rate and Rhythm: Normal rate and regular rhythm.     Pulses: Normal pulses.     Heart sounds: Normal heart sounds. No murmur. No friction rub. No gallop.   Pulmonary:     Effort: Pulmonary effort is normal. No respiratory distress.     Breath sounds: Normal breath sounds. No stridor. No wheezing, rhonchi or rales.  Chest:     Chest wall: No tenderness.  Musculoskeletal: Normal range of motion.  Skin:    General: Skin is warm and dry.     Capillary Refill: Capillary refill takes less than 2 seconds.     Coloration: Skin is not jaundiced or pale.     Findings: No bruising, erythema, lesion or rash.     Comments: 1cm erythematous spot on medial portion of L great toe with callous around   Neurological:     General: No focal deficit present.  Mental Status: He is alert and oriented to person, place, and time. Mental status is at baseline.  Psychiatric:        Mood and Affect: Mood normal.        Behavior: Behavior normal.        Thought Content: Thought content normal.        Judgment: Judgment normal.     Results for orders placed or performed in visit on 12/05/18  Hemoglobin A1c  Result Value Ref Range   Hemoglobin A1C 6.9       Assessment & Plan:   Problem List Items Addressed This Visit    None    Visit Diagnoses    Skin ulcer of right foot, limited to breakdown of skin ([place])    -  Primary   Will keep wrapped. Recheck 1 week, if not better, will get him into see wound   Cellulitis of toe of right foot       Will treat with bactrim- dose adjusted for dialysis. Recheck 1 week.        Follow up plan: Return in about 1 week (around 02/04/2019).

## 2019-01-29 DIAGNOSIS — N2581 Secondary hyperparathyroidism of renal origin: Secondary | ICD-10-CM | POA: Diagnosis not present

## 2019-01-29 DIAGNOSIS — Z992 Dependence on renal dialysis: Secondary | ICD-10-CM | POA: Diagnosis not present

## 2019-01-29 DIAGNOSIS — N186 End stage renal disease: Secondary | ICD-10-CM | POA: Diagnosis not present

## 2019-01-29 DIAGNOSIS — D509 Iron deficiency anemia, unspecified: Secondary | ICD-10-CM | POA: Diagnosis not present

## 2019-01-29 DIAGNOSIS — D631 Anemia in chronic kidney disease: Secondary | ICD-10-CM | POA: Diagnosis not present

## 2019-01-31 DIAGNOSIS — N186 End stage renal disease: Secondary | ICD-10-CM | POA: Diagnosis not present

## 2019-01-31 DIAGNOSIS — D509 Iron deficiency anemia, unspecified: Secondary | ICD-10-CM | POA: Diagnosis not present

## 2019-01-31 DIAGNOSIS — Z992 Dependence on renal dialysis: Secondary | ICD-10-CM | POA: Diagnosis not present

## 2019-01-31 DIAGNOSIS — D631 Anemia in chronic kidney disease: Secondary | ICD-10-CM | POA: Diagnosis not present

## 2019-01-31 DIAGNOSIS — N2581 Secondary hyperparathyroidism of renal origin: Secondary | ICD-10-CM | POA: Diagnosis not present

## 2019-02-03 DIAGNOSIS — D509 Iron deficiency anemia, unspecified: Secondary | ICD-10-CM | POA: Diagnosis not present

## 2019-02-03 DIAGNOSIS — N186 End stage renal disease: Secondary | ICD-10-CM | POA: Diagnosis not present

## 2019-02-03 DIAGNOSIS — D631 Anemia in chronic kidney disease: Secondary | ICD-10-CM | POA: Diagnosis not present

## 2019-02-03 DIAGNOSIS — Z992 Dependence on renal dialysis: Secondary | ICD-10-CM | POA: Diagnosis not present

## 2019-02-03 DIAGNOSIS — N2581 Secondary hyperparathyroidism of renal origin: Secondary | ICD-10-CM | POA: Diagnosis not present

## 2019-02-05 DIAGNOSIS — Z992 Dependence on renal dialysis: Secondary | ICD-10-CM | POA: Diagnosis not present

## 2019-02-05 DIAGNOSIS — N2581 Secondary hyperparathyroidism of renal origin: Secondary | ICD-10-CM | POA: Diagnosis not present

## 2019-02-05 DIAGNOSIS — D509 Iron deficiency anemia, unspecified: Secondary | ICD-10-CM | POA: Diagnosis not present

## 2019-02-05 DIAGNOSIS — N186 End stage renal disease: Secondary | ICD-10-CM | POA: Diagnosis not present

## 2019-02-05 DIAGNOSIS — D631 Anemia in chronic kidney disease: Secondary | ICD-10-CM | POA: Diagnosis not present

## 2019-02-07 ENCOUNTER — Encounter: Payer: Self-pay | Admitting: Family Medicine

## 2019-02-07 ENCOUNTER — Other Ambulatory Visit: Payer: Self-pay

## 2019-02-07 ENCOUNTER — Ambulatory Visit (INDEPENDENT_AMBULATORY_CARE_PROVIDER_SITE_OTHER): Payer: Medicare Other | Admitting: Family Medicine

## 2019-02-07 VITALS — BP 186/78 | HR 76 | Temp 98.1°F

## 2019-02-07 DIAGNOSIS — N2581 Secondary hyperparathyroidism of renal origin: Secondary | ICD-10-CM | POA: Diagnosis not present

## 2019-02-07 DIAGNOSIS — N186 End stage renal disease: Secondary | ICD-10-CM | POA: Diagnosis not present

## 2019-02-07 DIAGNOSIS — D631 Anemia in chronic kidney disease: Secondary | ICD-10-CM | POA: Diagnosis not present

## 2019-02-07 DIAGNOSIS — L97511 Non-pressure chronic ulcer of other part of right foot limited to breakdown of skin: Secondary | ICD-10-CM | POA: Diagnosis not present

## 2019-02-07 DIAGNOSIS — I70213 Atherosclerosis of native arteries of extremities with intermittent claudication, bilateral legs: Secondary | ICD-10-CM | POA: Diagnosis not present

## 2019-02-07 DIAGNOSIS — D509 Iron deficiency anemia, unspecified: Secondary | ICD-10-CM | POA: Diagnosis not present

## 2019-02-07 DIAGNOSIS — Z992 Dependence on renal dialysis: Secondary | ICD-10-CM | POA: Diagnosis not present

## 2019-02-07 MED ORDER — MUPIROCIN 2 % EX OINT: 1.0000 "application " | TOPICAL_OINTMENT | Freq: Two times a day (BID) | CUTANEOUS | 0 refills | Status: DC

## 2019-02-07 NOTE — Progress Notes (Signed)
BP (!) 186/78   Pulse 76   Temp 98.1 F (36.7 C)   SpO2 95%    Subjective:    Patient ID: Timothy Gibson, male    DOB: 07/18/1950, 68 y.o.   MRN: [id number]  HPI: LUISMIGUEL LAMERE is a 68 y.o. male  Chief Complaint  Patient presents with  . Ulcer   His toe is doing much better since taking the antibiotic. No pus. No redness, some skin break down. No pain. He feels like he is doing better, but continues with it being open. He has been keeping it dressed. No other concerns or complaints at this time.   Relevant past medical, surgical, family and social history reviewed and updated as indicated. Interim medical history since our last visit reviewed. Allergies and medications reviewed and updated.  Review of Systems  Constitutional: Negative.   Respiratory: Negative.   Cardiovascular: Negative.   Skin: Positive for wound. Negative for color change, pallor and rash.  Psychiatric/Behavioral: Negative.     Per HPI unless specifically indicated above     Objective:    BP (!) 186/78   Pulse 76   Temp 98.1 F (36.7 C)   SpO2 95%   Wt Readings from Last 3 Encounters:  08/15/18 243 lb (110.2 kg)  06/27/18 245 lb 9.6 oz (111.4 kg)  06/04/18 253 lb 8.5 oz (115 kg)    Physical Exam Vitals signs and nursing note reviewed.  Constitutional:      General: He is not in acute distress.    Appearance: Normal appearance. He is not ill-appearing, toxic-appearing or diaphoretic.  HENT:     Head: Normocephalic and atraumatic.     Right Ear: External ear normal.     Left Ear: External ear normal.     Nose: Nose normal.     Mouth/Throat:     Mouth: Mucous membranes are moist.     Pharynx: Oropharynx is clear.  Eyes:     General: No scleral icterus.       Right eye: No discharge.        Left eye: No discharge.     Extraocular Movements: Extraocular movements intact.     Conjunctiva/sclera: Conjunctivae normal.     Pupils: Pupils are equal, round, and reactive to light.  Neck:      Musculoskeletal: Normal range of motion and neck supple.  Cardiovascular:     Rate and Rhythm: Normal rate and regular rhythm.     Pulses: Normal pulses.     Heart sounds: Normal heart sounds. No murmur. No friction rub. No gallop.   Pulmonary:     Effort: Pulmonary effort is normal. No respiratory distress.     Breath sounds: Normal breath sounds. No stridor. No wheezing, rhonchi or rales.  Chest:     Chest wall: No tenderness.  Musculoskeletal: Normal range of motion.  Skin:    General: Skin is warm and dry.     Capillary Refill: Capillary refill takes less than 2 seconds.     Coloration: Skin is not jaundiced or pale.     Findings: No bruising, erythema, lesion or rash.     Comments: 1cm wound at medial edge of L great toenail. No redness, mild skin breakdown  Neurological:     General: No focal deficit present.     Mental Status: He is alert and oriented to person, place, and time. Mental status is at baseline.  Psychiatric:        Mood and  Affect: Mood normal.        Behavior: Behavior normal.        Thought Content: Thought content normal.        Judgment: Judgment normal.     Results for orders placed or performed in visit on 12/05/18  Hemoglobin A1c  Result Value Ref Range   Hemoglobin A1C 6.9       Assessment & Plan:   Problem List Items Addressed This Visit    None    Visit Diagnoses    Skin ulcer of right foot, limited to breakdown of skin ([place])    -  Primary   Improving. Continue to keep wrapped. Will start mupirocin. If not better in 4-5 days, will refer to wound.       Follow up plan: Return As scheduled.

## 2019-02-10 DIAGNOSIS — Z992 Dependence on renal dialysis: Secondary | ICD-10-CM | POA: Diagnosis not present

## 2019-02-10 DIAGNOSIS — D509 Iron deficiency anemia, unspecified: Secondary | ICD-10-CM | POA: Diagnosis not present

## 2019-02-10 DIAGNOSIS — N186 End stage renal disease: Secondary | ICD-10-CM | POA: Diagnosis not present

## 2019-02-10 DIAGNOSIS — N2581 Secondary hyperparathyroidism of renal origin: Secondary | ICD-10-CM | POA: Diagnosis not present

## 2019-02-10 DIAGNOSIS — D631 Anemia in chronic kidney disease: Secondary | ICD-10-CM | POA: Diagnosis not present

## 2019-02-12 DIAGNOSIS — Z992 Dependence on renal dialysis: Secondary | ICD-10-CM | POA: Diagnosis not present

## 2019-02-12 DIAGNOSIS — D631 Anemia in chronic kidney disease: Secondary | ICD-10-CM | POA: Diagnosis not present

## 2019-02-12 DIAGNOSIS — D509 Iron deficiency anemia, unspecified: Secondary | ICD-10-CM | POA: Diagnosis not present

## 2019-02-12 DIAGNOSIS — N186 End stage renal disease: Secondary | ICD-10-CM | POA: Diagnosis not present

## 2019-02-12 DIAGNOSIS — N2581 Secondary hyperparathyroidism of renal origin: Secondary | ICD-10-CM | POA: Diagnosis not present

## 2019-02-14 DIAGNOSIS — D631 Anemia in chronic kidney disease: Secondary | ICD-10-CM | POA: Diagnosis not present

## 2019-02-14 DIAGNOSIS — D509 Iron deficiency anemia, unspecified: Secondary | ICD-10-CM | POA: Diagnosis not present

## 2019-02-14 DIAGNOSIS — N186 End stage renal disease: Secondary | ICD-10-CM | POA: Diagnosis not present

## 2019-02-14 DIAGNOSIS — N2581 Secondary hyperparathyroidism of renal origin: Secondary | ICD-10-CM | POA: Diagnosis not present

## 2019-02-14 DIAGNOSIS — Z992 Dependence on renal dialysis: Secondary | ICD-10-CM | POA: Diagnosis not present

## 2019-02-17 DIAGNOSIS — N186 End stage renal disease: Secondary | ICD-10-CM | POA: Diagnosis not present

## 2019-02-17 DIAGNOSIS — Z992 Dependence on renal dialysis: Secondary | ICD-10-CM | POA: Diagnosis not present

## 2019-02-17 DIAGNOSIS — N2581 Secondary hyperparathyroidism of renal origin: Secondary | ICD-10-CM | POA: Diagnosis not present

## 2019-02-17 DIAGNOSIS — D509 Iron deficiency anemia, unspecified: Secondary | ICD-10-CM | POA: Diagnosis not present

## 2019-02-17 DIAGNOSIS — D631 Anemia in chronic kidney disease: Secondary | ICD-10-CM | POA: Diagnosis not present

## 2019-02-19 DIAGNOSIS — N186 End stage renal disease: Secondary | ICD-10-CM | POA: Diagnosis not present

## 2019-02-19 DIAGNOSIS — D631 Anemia in chronic kidney disease: Secondary | ICD-10-CM | POA: Diagnosis not present

## 2019-02-19 DIAGNOSIS — N2581 Secondary hyperparathyroidism of renal origin: Secondary | ICD-10-CM | POA: Diagnosis not present

## 2019-02-19 DIAGNOSIS — D509 Iron deficiency anemia, unspecified: Secondary | ICD-10-CM | POA: Diagnosis not present

## 2019-02-19 DIAGNOSIS — Z992 Dependence on renal dialysis: Secondary | ICD-10-CM | POA: Diagnosis not present

## 2019-02-21 ENCOUNTER — Ambulatory Visit: Payer: Medicare Other | Admitting: Family Medicine

## 2019-02-21 DIAGNOSIS — Z992 Dependence on renal dialysis: Secondary | ICD-10-CM | POA: Diagnosis not present

## 2019-02-21 DIAGNOSIS — D631 Anemia in chronic kidney disease: Secondary | ICD-10-CM | POA: Diagnosis not present

## 2019-02-21 DIAGNOSIS — N186 End stage renal disease: Secondary | ICD-10-CM | POA: Diagnosis not present

## 2019-02-21 DIAGNOSIS — D509 Iron deficiency anemia, unspecified: Secondary | ICD-10-CM | POA: Diagnosis not present

## 2019-02-21 DIAGNOSIS — N2581 Secondary hyperparathyroidism of renal origin: Secondary | ICD-10-CM | POA: Diagnosis not present

## 2019-02-24 DIAGNOSIS — N2581 Secondary hyperparathyroidism of renal origin: Secondary | ICD-10-CM | POA: Diagnosis not present

## 2019-02-24 DIAGNOSIS — D509 Iron deficiency anemia, unspecified: Secondary | ICD-10-CM | POA: Diagnosis not present

## 2019-02-24 DIAGNOSIS — D631 Anemia in chronic kidney disease: Secondary | ICD-10-CM | POA: Diagnosis not present

## 2019-02-24 DIAGNOSIS — N186 End stage renal disease: Secondary | ICD-10-CM | POA: Diagnosis not present

## 2019-02-24 DIAGNOSIS — Z992 Dependence on renal dialysis: Secondary | ICD-10-CM | POA: Diagnosis not present

## 2019-02-26 DIAGNOSIS — D509 Iron deficiency anemia, unspecified: Secondary | ICD-10-CM | POA: Diagnosis not present

## 2019-02-26 DIAGNOSIS — D631 Anemia in chronic kidney disease: Secondary | ICD-10-CM | POA: Diagnosis not present

## 2019-02-26 DIAGNOSIS — N186 End stage renal disease: Secondary | ICD-10-CM | POA: Diagnosis not present

## 2019-02-26 DIAGNOSIS — Z992 Dependence on renal dialysis: Secondary | ICD-10-CM | POA: Diagnosis not present

## 2019-02-26 DIAGNOSIS — N2581 Secondary hyperparathyroidism of renal origin: Secondary | ICD-10-CM | POA: Diagnosis not present

## 2019-02-28 DIAGNOSIS — N2581 Secondary hyperparathyroidism of renal origin: Secondary | ICD-10-CM | POA: Diagnosis not present

## 2019-02-28 DIAGNOSIS — N186 End stage renal disease: Secondary | ICD-10-CM | POA: Diagnosis not present

## 2019-02-28 DIAGNOSIS — D509 Iron deficiency anemia, unspecified: Secondary | ICD-10-CM | POA: Diagnosis not present

## 2019-02-28 DIAGNOSIS — D631 Anemia in chronic kidney disease: Secondary | ICD-10-CM | POA: Diagnosis not present

## 2019-02-28 DIAGNOSIS — Z992 Dependence on renal dialysis: Secondary | ICD-10-CM | POA: Diagnosis not present

## 2019-03-03 DIAGNOSIS — D509 Iron deficiency anemia, unspecified: Secondary | ICD-10-CM | POA: Diagnosis not present

## 2019-03-03 DIAGNOSIS — N2581 Secondary hyperparathyroidism of renal origin: Secondary | ICD-10-CM | POA: Diagnosis not present

## 2019-03-03 DIAGNOSIS — Z992 Dependence on renal dialysis: Secondary | ICD-10-CM | POA: Diagnosis not present

## 2019-03-03 DIAGNOSIS — D631 Anemia in chronic kidney disease: Secondary | ICD-10-CM | POA: Diagnosis not present

## 2019-03-03 DIAGNOSIS — N186 End stage renal disease: Secondary | ICD-10-CM | POA: Diagnosis not present

## 2019-03-05 DIAGNOSIS — D509 Iron deficiency anemia, unspecified: Secondary | ICD-10-CM | POA: Diagnosis not present

## 2019-03-05 DIAGNOSIS — N2581 Secondary hyperparathyroidism of renal origin: Secondary | ICD-10-CM | POA: Diagnosis not present

## 2019-03-05 DIAGNOSIS — N186 End stage renal disease: Secondary | ICD-10-CM | POA: Diagnosis not present

## 2019-03-05 DIAGNOSIS — D631 Anemia in chronic kidney disease: Secondary | ICD-10-CM | POA: Diagnosis not present

## 2019-03-05 DIAGNOSIS — Z992 Dependence on renal dialysis: Secondary | ICD-10-CM | POA: Diagnosis not present

## 2019-03-10 DIAGNOSIS — Z992 Dependence on renal dialysis: Secondary | ICD-10-CM | POA: Diagnosis not present

## 2019-03-10 DIAGNOSIS — N186 End stage renal disease: Secondary | ICD-10-CM | POA: Diagnosis not present

## 2019-03-10 DIAGNOSIS — D631 Anemia in chronic kidney disease: Secondary | ICD-10-CM | POA: Diagnosis not present

## 2019-03-10 DIAGNOSIS — D509 Iron deficiency anemia, unspecified: Secondary | ICD-10-CM | POA: Diagnosis not present

## 2019-03-10 DIAGNOSIS — N2581 Secondary hyperparathyroidism of renal origin: Secondary | ICD-10-CM | POA: Diagnosis not present

## 2019-03-12 DIAGNOSIS — N2581 Secondary hyperparathyroidism of renal origin: Secondary | ICD-10-CM | POA: Diagnosis not present

## 2019-03-12 DIAGNOSIS — Z23 Encounter for immunization: Secondary | ICD-10-CM | POA: Diagnosis not present

## 2019-03-12 DIAGNOSIS — N186 End stage renal disease: Secondary | ICD-10-CM | POA: Diagnosis not present

## 2019-03-12 DIAGNOSIS — Z992 Dependence on renal dialysis: Secondary | ICD-10-CM | POA: Diagnosis not present

## 2019-03-12 DIAGNOSIS — D631 Anemia in chronic kidney disease: Secondary | ICD-10-CM | POA: Diagnosis not present

## 2019-03-12 DIAGNOSIS — D509 Iron deficiency anemia, unspecified: Secondary | ICD-10-CM | POA: Diagnosis not present

## 2019-03-14 DIAGNOSIS — N186 End stage renal disease: Secondary | ICD-10-CM | POA: Diagnosis not present

## 2019-03-14 DIAGNOSIS — D509 Iron deficiency anemia, unspecified: Secondary | ICD-10-CM | POA: Diagnosis not present

## 2019-03-14 DIAGNOSIS — Z23 Encounter for immunization: Secondary | ICD-10-CM | POA: Diagnosis not present

## 2019-03-14 DIAGNOSIS — N2581 Secondary hyperparathyroidism of renal origin: Secondary | ICD-10-CM | POA: Diagnosis not present

## 2019-03-14 DIAGNOSIS — D631 Anemia in chronic kidney disease: Secondary | ICD-10-CM | POA: Diagnosis not present

## 2019-03-14 DIAGNOSIS — Z992 Dependence on renal dialysis: Secondary | ICD-10-CM | POA: Diagnosis not present

## 2019-03-17 DIAGNOSIS — D631 Anemia in chronic kidney disease: Secondary | ICD-10-CM | POA: Diagnosis not present

## 2019-03-17 DIAGNOSIS — N2581 Secondary hyperparathyroidism of renal origin: Secondary | ICD-10-CM | POA: Diagnosis not present

## 2019-03-17 DIAGNOSIS — D509 Iron deficiency anemia, unspecified: Secondary | ICD-10-CM | POA: Diagnosis not present

## 2019-03-17 DIAGNOSIS — N186 End stage renal disease: Secondary | ICD-10-CM | POA: Diagnosis not present

## 2019-03-17 DIAGNOSIS — Z992 Dependence on renal dialysis: Secondary | ICD-10-CM | POA: Diagnosis not present

## 2019-03-17 DIAGNOSIS — Z23 Encounter for immunization: Secondary | ICD-10-CM | POA: Diagnosis not present

## 2019-03-19 DIAGNOSIS — N2581 Secondary hyperparathyroidism of renal origin: Secondary | ICD-10-CM | POA: Diagnosis not present

## 2019-03-19 DIAGNOSIS — Z992 Dependence on renal dialysis: Secondary | ICD-10-CM | POA: Diagnosis not present

## 2019-03-19 DIAGNOSIS — N186 End stage renal disease: Secondary | ICD-10-CM | POA: Diagnosis not present

## 2019-03-19 DIAGNOSIS — Z23 Encounter for immunization: Secondary | ICD-10-CM | POA: Diagnosis not present

## 2019-03-19 DIAGNOSIS — D631 Anemia in chronic kidney disease: Secondary | ICD-10-CM | POA: Diagnosis not present

## 2019-03-19 DIAGNOSIS — D509 Iron deficiency anemia, unspecified: Secondary | ICD-10-CM | POA: Diagnosis not present

## 2019-03-21 DIAGNOSIS — N2581 Secondary hyperparathyroidism of renal origin: Secondary | ICD-10-CM | POA: Diagnosis not present

## 2019-03-21 DIAGNOSIS — Z23 Encounter for immunization: Secondary | ICD-10-CM | POA: Diagnosis not present

## 2019-03-21 DIAGNOSIS — Z992 Dependence on renal dialysis: Secondary | ICD-10-CM | POA: Diagnosis not present

## 2019-03-21 DIAGNOSIS — D631 Anemia in chronic kidney disease: Secondary | ICD-10-CM | POA: Diagnosis not present

## 2019-03-21 DIAGNOSIS — N186 End stage renal disease: Secondary | ICD-10-CM | POA: Diagnosis not present

## 2019-03-21 DIAGNOSIS — D509 Iron deficiency anemia, unspecified: Secondary | ICD-10-CM | POA: Diagnosis not present

## 2019-03-24 DIAGNOSIS — Z23 Encounter for immunization: Secondary | ICD-10-CM | POA: Diagnosis not present

## 2019-03-24 DIAGNOSIS — N186 End stage renal disease: Secondary | ICD-10-CM | POA: Diagnosis not present

## 2019-03-24 DIAGNOSIS — D631 Anemia in chronic kidney disease: Secondary | ICD-10-CM | POA: Diagnosis not present

## 2019-03-24 DIAGNOSIS — Z992 Dependence on renal dialysis: Secondary | ICD-10-CM | POA: Diagnosis not present

## 2019-03-24 DIAGNOSIS — D509 Iron deficiency anemia, unspecified: Secondary | ICD-10-CM | POA: Diagnosis not present

## 2019-03-24 DIAGNOSIS — N2581 Secondary hyperparathyroidism of renal origin: Secondary | ICD-10-CM | POA: Diagnosis not present

## 2019-03-26 DIAGNOSIS — D509 Iron deficiency anemia, unspecified: Secondary | ICD-10-CM | POA: Diagnosis not present

## 2019-03-26 DIAGNOSIS — Z992 Dependence on renal dialysis: Secondary | ICD-10-CM | POA: Diagnosis not present

## 2019-03-26 DIAGNOSIS — D631 Anemia in chronic kidney disease: Secondary | ICD-10-CM | POA: Diagnosis not present

## 2019-03-26 DIAGNOSIS — N186 End stage renal disease: Secondary | ICD-10-CM | POA: Diagnosis not present

## 2019-03-26 DIAGNOSIS — Z23 Encounter for immunization: Secondary | ICD-10-CM | POA: Diagnosis not present

## 2019-03-26 DIAGNOSIS — N2581 Secondary hyperparathyroidism of renal origin: Secondary | ICD-10-CM | POA: Diagnosis not present

## 2019-03-28 DIAGNOSIS — Z23 Encounter for immunization: Secondary | ICD-10-CM | POA: Diagnosis not present

## 2019-03-28 DIAGNOSIS — D509 Iron deficiency anemia, unspecified: Secondary | ICD-10-CM | POA: Diagnosis not present

## 2019-03-28 DIAGNOSIS — N186 End stage renal disease: Secondary | ICD-10-CM | POA: Diagnosis not present

## 2019-03-28 DIAGNOSIS — N2581 Secondary hyperparathyroidism of renal origin: Secondary | ICD-10-CM | POA: Diagnosis not present

## 2019-03-28 DIAGNOSIS — D631 Anemia in chronic kidney disease: Secondary | ICD-10-CM | POA: Diagnosis not present

## 2019-03-28 DIAGNOSIS — Z992 Dependence on renal dialysis: Secondary | ICD-10-CM | POA: Diagnosis not present

## 2019-03-31 DIAGNOSIS — Z992 Dependence on renal dialysis: Secondary | ICD-10-CM | POA: Diagnosis not present

## 2019-03-31 DIAGNOSIS — D631 Anemia in chronic kidney disease: Secondary | ICD-10-CM | POA: Diagnosis not present

## 2019-03-31 DIAGNOSIS — N2581 Secondary hyperparathyroidism of renal origin: Secondary | ICD-10-CM | POA: Diagnosis not present

## 2019-03-31 DIAGNOSIS — N186 End stage renal disease: Secondary | ICD-10-CM | POA: Diagnosis not present

## 2019-03-31 DIAGNOSIS — D509 Iron deficiency anemia, unspecified: Secondary | ICD-10-CM | POA: Diagnosis not present

## 2019-03-31 DIAGNOSIS — Z23 Encounter for immunization: Secondary | ICD-10-CM | POA: Diagnosis not present

## 2019-04-01 DIAGNOSIS — L851 Acquired keratosis [keratoderma] palmaris et plantaris: Secondary | ICD-10-CM | POA: Diagnosis not present

## 2019-04-01 DIAGNOSIS — Z794 Long term (current) use of insulin: Secondary | ICD-10-CM | POA: Diagnosis not present

## 2019-04-01 DIAGNOSIS — B351 Tinea unguium: Secondary | ICD-10-CM | POA: Diagnosis not present

## 2019-04-01 DIAGNOSIS — E1140 Type 2 diabetes mellitus with diabetic neuropathy, unspecified: Secondary | ICD-10-CM | POA: Diagnosis not present

## 2019-04-02 DIAGNOSIS — N186 End stage renal disease: Secondary | ICD-10-CM | POA: Diagnosis not present

## 2019-04-02 DIAGNOSIS — Z992 Dependence on renal dialysis: Secondary | ICD-10-CM | POA: Diagnosis not present

## 2019-04-02 DIAGNOSIS — Z23 Encounter for immunization: Secondary | ICD-10-CM | POA: Diagnosis not present

## 2019-04-02 DIAGNOSIS — N2581 Secondary hyperparathyroidism of renal origin: Secondary | ICD-10-CM | POA: Diagnosis not present

## 2019-04-02 DIAGNOSIS — D509 Iron deficiency anemia, unspecified: Secondary | ICD-10-CM | POA: Diagnosis not present

## 2019-04-02 DIAGNOSIS — D631 Anemia in chronic kidney disease: Secondary | ICD-10-CM | POA: Diagnosis not present

## 2019-04-04 DIAGNOSIS — Z23 Encounter for immunization: Secondary | ICD-10-CM | POA: Diagnosis not present

## 2019-04-04 DIAGNOSIS — N2581 Secondary hyperparathyroidism of renal origin: Secondary | ICD-10-CM | POA: Diagnosis not present

## 2019-04-04 DIAGNOSIS — D631 Anemia in chronic kidney disease: Secondary | ICD-10-CM | POA: Diagnosis not present

## 2019-04-04 DIAGNOSIS — Z992 Dependence on renal dialysis: Secondary | ICD-10-CM | POA: Diagnosis not present

## 2019-04-04 DIAGNOSIS — D509 Iron deficiency anemia, unspecified: Secondary | ICD-10-CM | POA: Diagnosis not present

## 2019-04-04 DIAGNOSIS — N186 End stage renal disease: Secondary | ICD-10-CM | POA: Diagnosis not present

## 2019-04-07 DIAGNOSIS — N186 End stage renal disease: Secondary | ICD-10-CM | POA: Diagnosis not present

## 2019-04-07 DIAGNOSIS — D509 Iron deficiency anemia, unspecified: Secondary | ICD-10-CM | POA: Diagnosis not present

## 2019-04-07 DIAGNOSIS — D631 Anemia in chronic kidney disease: Secondary | ICD-10-CM | POA: Diagnosis not present

## 2019-04-07 DIAGNOSIS — N2581 Secondary hyperparathyroidism of renal origin: Secondary | ICD-10-CM | POA: Diagnosis not present

## 2019-04-07 DIAGNOSIS — Z992 Dependence on renal dialysis: Secondary | ICD-10-CM | POA: Diagnosis not present

## 2019-04-07 DIAGNOSIS — Z23 Encounter for immunization: Secondary | ICD-10-CM | POA: Diagnosis not present

## 2019-04-09 DIAGNOSIS — Z992 Dependence on renal dialysis: Secondary | ICD-10-CM | POA: Diagnosis not present

## 2019-04-09 DIAGNOSIS — Z23 Encounter for immunization: Secondary | ICD-10-CM | POA: Diagnosis not present

## 2019-04-09 DIAGNOSIS — D631 Anemia in chronic kidney disease: Secondary | ICD-10-CM | POA: Diagnosis not present

## 2019-04-09 DIAGNOSIS — N186 End stage renal disease: Secondary | ICD-10-CM | POA: Diagnosis not present

## 2019-04-09 DIAGNOSIS — N2581 Secondary hyperparathyroidism of renal origin: Secondary | ICD-10-CM | POA: Diagnosis not present

## 2019-04-09 DIAGNOSIS — D509 Iron deficiency anemia, unspecified: Secondary | ICD-10-CM | POA: Diagnosis not present

## 2019-04-11 DIAGNOSIS — Z992 Dependence on renal dialysis: Secondary | ICD-10-CM | POA: Diagnosis not present

## 2019-04-11 DIAGNOSIS — D509 Iron deficiency anemia, unspecified: Secondary | ICD-10-CM | POA: Diagnosis not present

## 2019-04-11 DIAGNOSIS — N2581 Secondary hyperparathyroidism of renal origin: Secondary | ICD-10-CM | POA: Diagnosis not present

## 2019-04-11 DIAGNOSIS — N186 End stage renal disease: Secondary | ICD-10-CM | POA: Diagnosis not present

## 2019-04-11 DIAGNOSIS — D631 Anemia in chronic kidney disease: Secondary | ICD-10-CM | POA: Diagnosis not present

## 2019-04-14 DIAGNOSIS — N2581 Secondary hyperparathyroidism of renal origin: Secondary | ICD-10-CM | POA: Diagnosis not present

## 2019-04-14 DIAGNOSIS — D631 Anemia in chronic kidney disease: Secondary | ICD-10-CM | POA: Diagnosis not present

## 2019-04-14 DIAGNOSIS — N186 End stage renal disease: Secondary | ICD-10-CM | POA: Diagnosis not present

## 2019-04-14 DIAGNOSIS — D509 Iron deficiency anemia, unspecified: Secondary | ICD-10-CM | POA: Diagnosis not present

## 2019-04-14 DIAGNOSIS — Z992 Dependence on renal dialysis: Secondary | ICD-10-CM | POA: Diagnosis not present

## 2019-04-16 DIAGNOSIS — D509 Iron deficiency anemia, unspecified: Secondary | ICD-10-CM | POA: Diagnosis not present

## 2019-04-16 DIAGNOSIS — D631 Anemia in chronic kidney disease: Secondary | ICD-10-CM | POA: Diagnosis not present

## 2019-04-16 DIAGNOSIS — N2581 Secondary hyperparathyroidism of renal origin: Secondary | ICD-10-CM | POA: Diagnosis not present

## 2019-04-16 DIAGNOSIS — N186 End stage renal disease: Secondary | ICD-10-CM | POA: Diagnosis not present

## 2019-04-16 DIAGNOSIS — Z992 Dependence on renal dialysis: Secondary | ICD-10-CM | POA: Diagnosis not present

## 2019-04-18 DIAGNOSIS — D509 Iron deficiency anemia, unspecified: Secondary | ICD-10-CM | POA: Diagnosis not present

## 2019-04-18 DIAGNOSIS — Z992 Dependence on renal dialysis: Secondary | ICD-10-CM | POA: Diagnosis not present

## 2019-04-18 DIAGNOSIS — N2581 Secondary hyperparathyroidism of renal origin: Secondary | ICD-10-CM | POA: Diagnosis not present

## 2019-04-18 DIAGNOSIS — N186 End stage renal disease: Secondary | ICD-10-CM | POA: Diagnosis not present

## 2019-04-18 DIAGNOSIS — D631 Anemia in chronic kidney disease: Secondary | ICD-10-CM | POA: Diagnosis not present

## 2019-04-21 DIAGNOSIS — Z992 Dependence on renal dialysis: Secondary | ICD-10-CM | POA: Diagnosis not present

## 2019-04-21 DIAGNOSIS — D509 Iron deficiency anemia, unspecified: Secondary | ICD-10-CM | POA: Diagnosis not present

## 2019-04-21 DIAGNOSIS — D631 Anemia in chronic kidney disease: Secondary | ICD-10-CM | POA: Diagnosis not present

## 2019-04-21 DIAGNOSIS — N186 End stage renal disease: Secondary | ICD-10-CM | POA: Diagnosis not present

## 2019-04-21 DIAGNOSIS — E119 Type 2 diabetes mellitus without complications: Secondary | ICD-10-CM | POA: Diagnosis not present

## 2019-04-21 DIAGNOSIS — N2581 Secondary hyperparathyroidism of renal origin: Secondary | ICD-10-CM | POA: Diagnosis not present

## 2019-04-23 DIAGNOSIS — N186 End stage renal disease: Secondary | ICD-10-CM | POA: Diagnosis not present

## 2019-04-23 DIAGNOSIS — N2581 Secondary hyperparathyroidism of renal origin: Secondary | ICD-10-CM | POA: Diagnosis not present

## 2019-04-23 DIAGNOSIS — D509 Iron deficiency anemia, unspecified: Secondary | ICD-10-CM | POA: Diagnosis not present

## 2019-04-23 DIAGNOSIS — Z992 Dependence on renal dialysis: Secondary | ICD-10-CM | POA: Diagnosis not present

## 2019-04-23 DIAGNOSIS — D631 Anemia in chronic kidney disease: Secondary | ICD-10-CM | POA: Diagnosis not present

## 2019-04-25 DIAGNOSIS — D509 Iron deficiency anemia, unspecified: Secondary | ICD-10-CM | POA: Diagnosis not present

## 2019-04-25 DIAGNOSIS — N186 End stage renal disease: Secondary | ICD-10-CM | POA: Diagnosis not present

## 2019-04-25 DIAGNOSIS — Z992 Dependence on renal dialysis: Secondary | ICD-10-CM | POA: Diagnosis not present

## 2019-04-25 DIAGNOSIS — D631 Anemia in chronic kidney disease: Secondary | ICD-10-CM | POA: Diagnosis not present

## 2019-04-25 DIAGNOSIS — N2581 Secondary hyperparathyroidism of renal origin: Secondary | ICD-10-CM | POA: Diagnosis not present

## 2019-04-28 DIAGNOSIS — D631 Anemia in chronic kidney disease: Secondary | ICD-10-CM | POA: Diagnosis not present

## 2019-04-28 DIAGNOSIS — N2581 Secondary hyperparathyroidism of renal origin: Secondary | ICD-10-CM | POA: Diagnosis not present

## 2019-04-28 DIAGNOSIS — N186 End stage renal disease: Secondary | ICD-10-CM | POA: Diagnosis not present

## 2019-04-28 DIAGNOSIS — Z992 Dependence on renal dialysis: Secondary | ICD-10-CM | POA: Diagnosis not present

## 2019-04-28 DIAGNOSIS — D509 Iron deficiency anemia, unspecified: Secondary | ICD-10-CM | POA: Diagnosis not present

## 2019-04-30 DIAGNOSIS — N2581 Secondary hyperparathyroidism of renal origin: Secondary | ICD-10-CM | POA: Diagnosis not present

## 2019-04-30 DIAGNOSIS — Z992 Dependence on renal dialysis: Secondary | ICD-10-CM | POA: Diagnosis not present

## 2019-04-30 DIAGNOSIS — N186 End stage renal disease: Secondary | ICD-10-CM | POA: Diagnosis not present

## 2019-04-30 DIAGNOSIS — D509 Iron deficiency anemia, unspecified: Secondary | ICD-10-CM | POA: Diagnosis not present

## 2019-04-30 DIAGNOSIS — D631 Anemia in chronic kidney disease: Secondary | ICD-10-CM | POA: Diagnosis not present

## 2019-05-02 DIAGNOSIS — Z992 Dependence on renal dialysis: Secondary | ICD-10-CM | POA: Diagnosis not present

## 2019-05-02 DIAGNOSIS — D509 Iron deficiency anemia, unspecified: Secondary | ICD-10-CM | POA: Diagnosis not present

## 2019-05-02 DIAGNOSIS — N186 End stage renal disease: Secondary | ICD-10-CM | POA: Diagnosis not present

## 2019-05-02 DIAGNOSIS — N2581 Secondary hyperparathyroidism of renal origin: Secondary | ICD-10-CM | POA: Diagnosis not present

## 2019-05-02 DIAGNOSIS — D631 Anemia in chronic kidney disease: Secondary | ICD-10-CM | POA: Diagnosis not present

## 2019-05-05 DIAGNOSIS — N186 End stage renal disease: Secondary | ICD-10-CM | POA: Diagnosis not present

## 2019-05-05 DIAGNOSIS — D509 Iron deficiency anemia, unspecified: Secondary | ICD-10-CM | POA: Diagnosis not present

## 2019-05-05 DIAGNOSIS — Z992 Dependence on renal dialysis: Secondary | ICD-10-CM | POA: Diagnosis not present

## 2019-05-05 DIAGNOSIS — N2581 Secondary hyperparathyroidism of renal origin: Secondary | ICD-10-CM | POA: Diagnosis not present

## 2019-05-05 DIAGNOSIS — D631 Anemia in chronic kidney disease: Secondary | ICD-10-CM | POA: Diagnosis not present

## 2019-05-07 DIAGNOSIS — N186 End stage renal disease: Secondary | ICD-10-CM | POA: Diagnosis not present

## 2019-05-07 DIAGNOSIS — N2581 Secondary hyperparathyroidism of renal origin: Secondary | ICD-10-CM | POA: Diagnosis not present

## 2019-05-07 DIAGNOSIS — D631 Anemia in chronic kidney disease: Secondary | ICD-10-CM | POA: Diagnosis not present

## 2019-05-07 DIAGNOSIS — D509 Iron deficiency anemia, unspecified: Secondary | ICD-10-CM | POA: Diagnosis not present

## 2019-05-07 DIAGNOSIS — Z992 Dependence on renal dialysis: Secondary | ICD-10-CM | POA: Diagnosis not present

## 2019-05-09 DIAGNOSIS — N186 End stage renal disease: Secondary | ICD-10-CM | POA: Diagnosis not present

## 2019-05-09 DIAGNOSIS — D509 Iron deficiency anemia, unspecified: Secondary | ICD-10-CM | POA: Diagnosis not present

## 2019-05-09 DIAGNOSIS — Z992 Dependence on renal dialysis: Secondary | ICD-10-CM | POA: Diagnosis not present

## 2019-05-09 DIAGNOSIS — D631 Anemia in chronic kidney disease: Secondary | ICD-10-CM | POA: Diagnosis not present

## 2019-05-09 DIAGNOSIS — N2581 Secondary hyperparathyroidism of renal origin: Secondary | ICD-10-CM | POA: Diagnosis not present

## 2019-05-10 DIAGNOSIS — Z992 Dependence on renal dialysis: Secondary | ICD-10-CM | POA: Diagnosis not present

## 2019-05-10 DIAGNOSIS — N186 End stage renal disease: Secondary | ICD-10-CM | POA: Diagnosis not present

## 2019-05-10 LAB — HEMOGLOBIN A1C: Hemoglobin A1C: 6.4

## 2019-05-12 DIAGNOSIS — D509 Iron deficiency anemia, unspecified: Secondary | ICD-10-CM | POA: Diagnosis not present

## 2019-05-12 DIAGNOSIS — Z992 Dependence on renal dialysis: Secondary | ICD-10-CM | POA: Diagnosis not present

## 2019-05-12 DIAGNOSIS — D631 Anemia in chronic kidney disease: Secondary | ICD-10-CM | POA: Diagnosis not present

## 2019-05-12 DIAGNOSIS — N186 End stage renal disease: Secondary | ICD-10-CM | POA: Diagnosis not present

## 2019-05-12 DIAGNOSIS — N2581 Secondary hyperparathyroidism of renal origin: Secondary | ICD-10-CM | POA: Diagnosis not present

## 2019-05-14 DIAGNOSIS — N2581 Secondary hyperparathyroidism of renal origin: Secondary | ICD-10-CM | POA: Diagnosis not present

## 2019-05-14 DIAGNOSIS — N186 End stage renal disease: Secondary | ICD-10-CM | POA: Diagnosis not present

## 2019-05-14 DIAGNOSIS — Z992 Dependence on renal dialysis: Secondary | ICD-10-CM | POA: Diagnosis not present

## 2019-05-14 DIAGNOSIS — D509 Iron deficiency anemia, unspecified: Secondary | ICD-10-CM | POA: Diagnosis not present

## 2019-05-14 DIAGNOSIS — D631 Anemia in chronic kidney disease: Secondary | ICD-10-CM | POA: Diagnosis not present

## 2019-05-15 ENCOUNTER — Other Ambulatory Visit: Payer: Self-pay | Admitting: Family Medicine

## 2019-05-15 NOTE — Telephone Encounter (Signed)
Forwarding medication refill request to PCP for review. 

## 2019-05-16 DIAGNOSIS — Z992 Dependence on renal dialysis: Secondary | ICD-10-CM | POA: Diagnosis not present

## 2019-05-16 DIAGNOSIS — N186 End stage renal disease: Secondary | ICD-10-CM | POA: Diagnosis not present

## 2019-05-16 DIAGNOSIS — D631 Anemia in chronic kidney disease: Secondary | ICD-10-CM | POA: Diagnosis not present

## 2019-05-16 DIAGNOSIS — D509 Iron deficiency anemia, unspecified: Secondary | ICD-10-CM | POA: Diagnosis not present

## 2019-05-16 DIAGNOSIS — N2581 Secondary hyperparathyroidism of renal origin: Secondary | ICD-10-CM | POA: Diagnosis not present

## 2019-05-19 DIAGNOSIS — N2581 Secondary hyperparathyroidism of renal origin: Secondary | ICD-10-CM | POA: Diagnosis not present

## 2019-05-19 DIAGNOSIS — D509 Iron deficiency anemia, unspecified: Secondary | ICD-10-CM | POA: Diagnosis not present

## 2019-05-19 DIAGNOSIS — N186 End stage renal disease: Secondary | ICD-10-CM | POA: Diagnosis not present

## 2019-05-19 DIAGNOSIS — D631 Anemia in chronic kidney disease: Secondary | ICD-10-CM | POA: Diagnosis not present

## 2019-05-19 DIAGNOSIS — Z992 Dependence on renal dialysis: Secondary | ICD-10-CM | POA: Diagnosis not present

## 2019-05-21 DIAGNOSIS — D631 Anemia in chronic kidney disease: Secondary | ICD-10-CM | POA: Diagnosis not present

## 2019-05-21 DIAGNOSIS — Z992 Dependence on renal dialysis: Secondary | ICD-10-CM | POA: Diagnosis not present

## 2019-05-21 DIAGNOSIS — N186 End stage renal disease: Secondary | ICD-10-CM | POA: Diagnosis not present

## 2019-05-21 DIAGNOSIS — N2581 Secondary hyperparathyroidism of renal origin: Secondary | ICD-10-CM | POA: Diagnosis not present

## 2019-05-21 DIAGNOSIS — D509 Iron deficiency anemia, unspecified: Secondary | ICD-10-CM | POA: Diagnosis not present

## 2019-05-23 DIAGNOSIS — N2581 Secondary hyperparathyroidism of renal origin: Secondary | ICD-10-CM | POA: Diagnosis not present

## 2019-05-23 DIAGNOSIS — D509 Iron deficiency anemia, unspecified: Secondary | ICD-10-CM | POA: Diagnosis not present

## 2019-05-23 DIAGNOSIS — Z992 Dependence on renal dialysis: Secondary | ICD-10-CM | POA: Diagnosis not present

## 2019-05-23 DIAGNOSIS — D631 Anemia in chronic kidney disease: Secondary | ICD-10-CM | POA: Diagnosis not present

## 2019-05-23 DIAGNOSIS — N186 End stage renal disease: Secondary | ICD-10-CM | POA: Diagnosis not present

## 2019-05-26 DIAGNOSIS — D631 Anemia in chronic kidney disease: Secondary | ICD-10-CM | POA: Diagnosis not present

## 2019-05-26 DIAGNOSIS — D509 Iron deficiency anemia, unspecified: Secondary | ICD-10-CM | POA: Diagnosis not present

## 2019-05-26 DIAGNOSIS — N2581 Secondary hyperparathyroidism of renal origin: Secondary | ICD-10-CM | POA: Diagnosis not present

## 2019-05-26 DIAGNOSIS — N186 End stage renal disease: Secondary | ICD-10-CM | POA: Diagnosis not present

## 2019-05-26 DIAGNOSIS — Z992 Dependence on renal dialysis: Secondary | ICD-10-CM | POA: Diagnosis not present

## 2019-05-28 DIAGNOSIS — Z992 Dependence on renal dialysis: Secondary | ICD-10-CM | POA: Diagnosis not present

## 2019-05-28 DIAGNOSIS — N2581 Secondary hyperparathyroidism of renal origin: Secondary | ICD-10-CM | POA: Diagnosis not present

## 2019-05-28 DIAGNOSIS — D631 Anemia in chronic kidney disease: Secondary | ICD-10-CM | POA: Diagnosis not present

## 2019-05-28 DIAGNOSIS — D509 Iron deficiency anemia, unspecified: Secondary | ICD-10-CM | POA: Diagnosis not present

## 2019-05-28 DIAGNOSIS — N186 End stage renal disease: Secondary | ICD-10-CM | POA: Diagnosis not present

## 2019-05-30 DIAGNOSIS — D631 Anemia in chronic kidney disease: Secondary | ICD-10-CM | POA: Diagnosis not present

## 2019-05-30 DIAGNOSIS — Z992 Dependence on renal dialysis: Secondary | ICD-10-CM | POA: Diagnosis not present

## 2019-05-30 DIAGNOSIS — N2581 Secondary hyperparathyroidism of renal origin: Secondary | ICD-10-CM | POA: Diagnosis not present

## 2019-05-30 DIAGNOSIS — D509 Iron deficiency anemia, unspecified: Secondary | ICD-10-CM | POA: Diagnosis not present

## 2019-05-30 DIAGNOSIS — N186 End stage renal disease: Secondary | ICD-10-CM | POA: Diagnosis not present

## 2019-06-01 DIAGNOSIS — D631 Anemia in chronic kidney disease: Secondary | ICD-10-CM | POA: Diagnosis not present

## 2019-06-01 DIAGNOSIS — N2581 Secondary hyperparathyroidism of renal origin: Secondary | ICD-10-CM | POA: Diagnosis not present

## 2019-06-01 DIAGNOSIS — N186 End stage renal disease: Secondary | ICD-10-CM | POA: Diagnosis not present

## 2019-06-01 DIAGNOSIS — D509 Iron deficiency anemia, unspecified: Secondary | ICD-10-CM | POA: Diagnosis not present

## 2019-06-01 DIAGNOSIS — Z992 Dependence on renal dialysis: Secondary | ICD-10-CM | POA: Diagnosis not present

## 2019-06-03 DIAGNOSIS — Z992 Dependence on renal dialysis: Secondary | ICD-10-CM | POA: Diagnosis not present

## 2019-06-03 DIAGNOSIS — N186 End stage renal disease: Secondary | ICD-10-CM | POA: Diagnosis not present

## 2019-06-03 DIAGNOSIS — N2581 Secondary hyperparathyroidism of renal origin: Secondary | ICD-10-CM | POA: Diagnosis not present

## 2019-06-03 DIAGNOSIS — D631 Anemia in chronic kidney disease: Secondary | ICD-10-CM | POA: Diagnosis not present

## 2019-06-03 DIAGNOSIS — D509 Iron deficiency anemia, unspecified: Secondary | ICD-10-CM | POA: Diagnosis not present

## 2019-06-06 DIAGNOSIS — D631 Anemia in chronic kidney disease: Secondary | ICD-10-CM | POA: Diagnosis not present

## 2019-06-06 DIAGNOSIS — Z992 Dependence on renal dialysis: Secondary | ICD-10-CM | POA: Diagnosis not present

## 2019-06-06 DIAGNOSIS — D509 Iron deficiency anemia, unspecified: Secondary | ICD-10-CM | POA: Diagnosis not present

## 2019-06-06 DIAGNOSIS — N2581 Secondary hyperparathyroidism of renal origin: Secondary | ICD-10-CM | POA: Diagnosis not present

## 2019-06-06 DIAGNOSIS — N186 End stage renal disease: Secondary | ICD-10-CM | POA: Diagnosis not present

## 2019-06-09 DIAGNOSIS — D509 Iron deficiency anemia, unspecified: Secondary | ICD-10-CM | POA: Diagnosis not present

## 2019-06-09 DIAGNOSIS — Z992 Dependence on renal dialysis: Secondary | ICD-10-CM | POA: Diagnosis not present

## 2019-06-09 DIAGNOSIS — N2581 Secondary hyperparathyroidism of renal origin: Secondary | ICD-10-CM | POA: Diagnosis not present

## 2019-06-09 DIAGNOSIS — D631 Anemia in chronic kidney disease: Secondary | ICD-10-CM | POA: Diagnosis not present

## 2019-06-09 DIAGNOSIS — N186 End stage renal disease: Secondary | ICD-10-CM | POA: Diagnosis not present

## 2019-06-16 ENCOUNTER — Other Ambulatory Visit: Payer: Self-pay

## 2019-06-16 ENCOUNTER — Encounter: Payer: Self-pay | Admitting: Family Medicine

## 2019-06-16 ENCOUNTER — Ambulatory Visit (INDEPENDENT_AMBULATORY_CARE_PROVIDER_SITE_OTHER): Payer: Medicare Other | Admitting: Family Medicine

## 2019-06-16 VITALS — BP 164/85 | HR 78 | Temp 98.1°F | Wt 251.0 lb

## 2019-06-16 DIAGNOSIS — J449 Chronic obstructive pulmonary disease, unspecified: Secondary | ICD-10-CM

## 2019-06-16 DIAGNOSIS — Z23 Encounter for immunization: Secondary | ICD-10-CM | POA: Diagnosis not present

## 2019-06-16 DIAGNOSIS — I70213 Atherosclerosis of native arteries of extremities with intermittent claudication, bilateral legs: Secondary | ICD-10-CM | POA: Diagnosis not present

## 2019-06-16 DIAGNOSIS — E1165 Type 2 diabetes mellitus with hyperglycemia: Secondary | ICD-10-CM

## 2019-06-16 DIAGNOSIS — I10 Essential (primary) hypertension: Secondary | ICD-10-CM

## 2019-06-16 DIAGNOSIS — E08621 Diabetes mellitus due to underlying condition with foot ulcer: Secondary | ICD-10-CM | POA: Diagnosis not present

## 2019-06-16 DIAGNOSIS — N186 End stage renal disease: Secondary | ICD-10-CM

## 2019-06-16 DIAGNOSIS — L97521 Non-pressure chronic ulcer of other part of left foot limited to breakdown of skin: Secondary | ICD-10-CM

## 2019-06-16 DIAGNOSIS — I5032 Chronic diastolic (congestive) heart failure: Secondary | ICD-10-CM

## 2019-06-16 MED ORDER — ALBUTEROL SULFATE HFA 108 (90 BASE) MCG/ACT IN AERS: 2.0000 | INHALATION_SPRAY | Freq: Four times a day (QID) | RESPIRATORY_TRACT | 5 refills | Status: DC | PRN

## 2019-06-16 MED ORDER — DOXYCYCLINE HYCLATE 100 MG PO TABS: 100.0000 mg | ORAL_TABLET | Freq: Two times a day (BID) | ORAL | 0 refills | Status: DC

## 2019-06-16 MED ORDER — LOSARTAN POTASSIUM 100 MG PO TABS: 100.0000 mg | ORAL_TABLET | Freq: Every day | ORAL | 1 refills | Status: DC

## 2019-06-16 MED ORDER — INSULIN DEGLUDEC 100 UNIT/ML ~~LOC~~ SOPN: 12.0000 [IU] | PEN_INJECTOR | Freq: Every day | SUBCUTANEOUS | 2 refills | Status: DC

## 2019-06-16 MED ORDER — AMLODIPINE BESYLATE 5 MG PO TABS: 5.0000 mg | ORAL_TABLET | Freq: Every day | ORAL | 1 refills | Status: DC

## 2019-06-16 NOTE — Patient Instructions (Signed)
Yvetta Coder, [address]  [place], [place] [address]  [phone]

## 2019-06-16 NOTE — Progress Notes (Signed)
BP (!) 164/85   Pulse 78   Temp 98.1 F (36.7 C) (Oral)   Wt 251 lb (113.9 kg)   SpO2 99%   BMI 30.55 kg/m    Subjective:    Patient ID: Timothy Gibson, male    DOB: 07/16/1950, 68 y.o.   MRN: [id number]  HPI: Timothy Gibson is a 68 y.o. male  Chief Complaint  Patient presents with  . Toe Pain    left big toe dranage since last [place]   Patient dealing with a new diabetic ulcer on tip of left great toe x 1 week now. Drainage, loose skin. Has a frequent hx of foot ulcers and is followed by Podiatry regularly. Has not seen them for this episode. Denies fever, chills. Using wound cleanser, topical antibiotics and keeping covered.   Goes to [facility] in [place] for dialysis, states they draw his labs regularly including diabetes and lipids.   DM - Taking 12 units of tresiba and home sugars in the low 100s, with last A1C in the low 6's range if he remembers correctly. No low blood sugar spells. Has not been eating as much as he used to.   HTN - BPs running low to normal typically. Gets nervous at appts and states it typically jumps up in that setting. Has frequent hypotension during dialysis treatments.   COPD - breathing has been stable without recent COPD exacerbations. Sometimes having DOE especially after dialysis.   Relevant past medical, surgical, family and social history reviewed and updated as indicated. Interim medical history since our last visit reviewed. Allergies and medications reviewed and updated.  Review of Systems  Per HPI unless specifically indicated above     Objective:    BP (!) 164/85   Pulse 78   Temp 98.1 F (36.7 C) (Oral)   Wt 251 lb (113.9 kg)   SpO2 99%   BMI 30.55 kg/m   Wt Readings from Last 3 Encounters:  06/16/19 251 lb (113.9 kg)  08/15/18 243 lb (110.2 kg)  06/27/18 245 lb 9.6 oz (111.4 kg)    Physical Exam Vitals and nursing note reviewed.  Constitutional:      Appearance: Normal appearance.  HENT:     Head: Atraumatic.   Eyes:     Extraocular Movements: Extraocular movements intact.     Conjunctiva/sclera: Conjunctivae normal.  Cardiovascular:     Rate and Rhythm: Normal rate and regular rhythm.     Pulses: Normal pulses.  Pulmonary:     Effort: Pulmonary effort is normal.     Breath sounds: Normal breath sounds.  Musculoskeletal:        General: Normal range of motion.     Cervical back: Normal range of motion and neck supple.  Skin:    General: Skin is warm.     Comments: 1 cm circular superficial ulcer on tip of left great toe, with serosanguineous drainage present. Some loose macerated tissue surrounding ulcer as well, and nail is thickened, loose and soft   Neurological:     General: No focal deficit present.     Mental Status: He is oriented to person, place, and time.  Psychiatric:        Mood and Affect: Mood normal.        Thought Content: Thought content normal.        Judgment: Judgment normal.     Results for orders placed or performed in visit on 12/05/18  Hemoglobin A1c  Result Value Ref Range  Hemoglobin A1C 6.9       Assessment & Plan:   Problem List Items Addressed This Visit      Cardiovascular and Mediastinum   Atherosclerosis of native arteries of extremity with intermittent claudication ([place])    Will request labs from [place]. Continue diet control      Relevant Medications   losartan (COZAAR) 100 MG tablet   amLODipine (NORVASC) 5 MG tablet   Essential hypertension    BP mildly elevated today but often low at home and dialysis per patient so will not increase regimen. Continue regimen with close monitoring      Relevant Medications   losartan (COZAAR) 100 MG tablet   amLODipine (NORVASC) 5 MG tablet   Chronic diastolic heart failure ([place])    With ESRD on dialysis. Continue close monitoring of fluid balance      Relevant Medications   losartan (COZAAR) 100 MG tablet   amLODipine (NORVASC) 5 MG tablet     Respiratory   COPD (chronic obstructive pulmonary  disease) ([facility])    Stable and under fairly good control, continue current inhaler regimen      Relevant Medications   albuterol (VENTOLIN HFA) 108 (90 Base) MCG/ACT inhaler     Endocrine   Type 2 diabetes mellitus with hyperglycemia ([place])    Will request labs from [place]. Per patient reports it appears BSs under good control. Continue current regimen for now      Relevant Medications   losartan (COZAAR) 100 MG tablet   insulin degludec (TRESIBA) 100 UNIT/ML SOPN FlexTouch Pen     Genitourinary   End stage renal disease (H[facility])    On dialysis, continue current regimen       Other Visit Diagnoses    Diabetic ulcer of toe of left foot associated with diabetes mellitus due to underlying condition, limited to breakdown of skin ([place])    -  Primary   Tx with doxycycline, good wound care, and close Podiatry f/u. Return precautions given   Relevant Medications   losartan (COZAAR) 100 MG tablet   insulin degludec (TRESIBA) 100 UNIT/ML SOPN FlexTouch Pen   Need for pneumococcal vaccine       Relevant Orders   Pneumococcal conjugate vaccine 13-valent (Completed)       Follow up plan: Return in about 6 months (around 12/15/2019) for 6 month f/u.

## 2019-06-18 DIAGNOSIS — E1140 Type 2 diabetes mellitus with diabetic neuropathy, unspecified: Secondary | ICD-10-CM | POA: Diagnosis not present

## 2019-06-18 DIAGNOSIS — Z794 Long term (current) use of insulin: Secondary | ICD-10-CM | POA: Diagnosis not present

## 2019-06-18 DIAGNOSIS — L97521 Non-pressure chronic ulcer of other part of left foot limited to breakdown of skin: Secondary | ICD-10-CM | POA: Diagnosis not present

## 2019-06-18 DIAGNOSIS — B351 Tinea unguium: Secondary | ICD-10-CM | POA: Diagnosis not present

## 2019-06-19 NOTE — Assessment & Plan Note (Signed)
With ESRD on dialysis. Continue close monitoring of fluid balance

## 2019-06-19 NOTE — Assessment & Plan Note (Signed)
Will request labs from [facility]. Per patient reports it appears BSs under good control. Continue current regimen for now

## 2019-06-19 NOTE — Assessment & Plan Note (Signed)
Will request labs from [place]. Continue diet control

## 2019-06-19 NOTE — Assessment & Plan Note (Signed)
Stable and under fairly good control, continue current inhaler regimen

## 2019-06-19 NOTE — Assessment & Plan Note (Signed)
On dialysis, continue current regimen

## 2019-06-19 NOTE — Assessment & Plan Note (Signed)
BP mildly elevated today but often low at home and dialysis per patient so will not increase regimen. Continue regimen with close monitoring

## 2019-07-11 DIAGNOSIS — N2581 Secondary hyperparathyroidism of renal origin: Secondary | ICD-10-CM | POA: Diagnosis not present

## 2019-07-11 DIAGNOSIS — D509 Iron deficiency anemia, unspecified: Secondary | ICD-10-CM | POA: Diagnosis not present

## 2019-07-11 DIAGNOSIS — D631 Anemia in chronic kidney disease: Secondary | ICD-10-CM | POA: Diagnosis not present

## 2019-07-11 DIAGNOSIS — N186 End stage renal disease: Secondary | ICD-10-CM | POA: Diagnosis not present

## 2019-07-11 DIAGNOSIS — Z992 Dependence on renal dialysis: Secondary | ICD-10-CM | POA: Diagnosis not present

## 2019-07-14 ENCOUNTER — Other Ambulatory Visit (INDEPENDENT_AMBULATORY_CARE_PROVIDER_SITE_OTHER): Payer: Self-pay | Admitting: Nurse Practitioner

## 2019-07-14 ENCOUNTER — Encounter: Payer: Self-pay | Admitting: Nurse Practitioner

## 2019-07-14 ENCOUNTER — Ambulatory Visit (INDEPENDENT_AMBULATORY_CARE_PROVIDER_SITE_OTHER): Payer: Medicare Other | Admitting: Nurse Practitioner

## 2019-07-14 ENCOUNTER — Other Ambulatory Visit: Payer: Self-pay

## 2019-07-14 VITALS — BP 185/118 | HR 85 | Temp 97.7°F

## 2019-07-14 DIAGNOSIS — D509 Iron deficiency anemia, unspecified: Secondary | ICD-10-CM | POA: Diagnosis not present

## 2019-07-14 DIAGNOSIS — N2581 Secondary hyperparathyroidism of renal origin: Secondary | ICD-10-CM | POA: Diagnosis not present

## 2019-07-14 DIAGNOSIS — L97529 Non-pressure chronic ulcer of other part of left foot with unspecified severity: Secondary | ICD-10-CM

## 2019-07-14 DIAGNOSIS — E11621 Type 2 diabetes mellitus with foot ulcer: Secondary | ICD-10-CM | POA: Insufficient documentation

## 2019-07-14 DIAGNOSIS — D631 Anemia in chronic kidney disease: Secondary | ICD-10-CM | POA: Diagnosis not present

## 2019-07-14 DIAGNOSIS — Z992 Dependence on renal dialysis: Secondary | ICD-10-CM | POA: Diagnosis not present

## 2019-07-14 DIAGNOSIS — N186 End stage renal disease: Secondary | ICD-10-CM | POA: Diagnosis not present

## 2019-07-14 NOTE — Assessment & Plan Note (Addendum)
Ongoing.  Continue collaboration with vascular and podiatry.  Will place urgent referral to wound clinic for further evaluation and recommendations.  Suspect this will become ongoing issue due to circulation to BLE with diabetes.  Maintain goal control of diabetes.  Continue current wound care at home.  CCM referral.

## 2019-07-14 NOTE — Patient Instructions (Signed)

## 2019-07-14 NOTE — Progress Notes (Signed)
BP (!) 185/118   Pulse 85   Temp 97.7 F (36.5 C) (Oral)   SpO2 91%    Subjective:    Patient ID: Timothy Gibson, male    DOB: 1950-08-21, 69 y.o.   MRN: [id number]  HPI: Timothy Gibson is a 69 y.o. male  Chief Complaint  Patient presents with  . Toe Pain    ulcer on left big toe.    DIABETIC ULCER LEFT GREAT TOE Patient with diabetes insulin dependent, last A1C in 6 range.  Has had ulcer present x 3 weeks.  Was treated with abx and then seen by podiatry and has had debridement, he reports they "have talked about amputation". Has had similar issue to right foot (great toe) before, last year, and saw wound care with improvement after 4 months. Patient does see vein/vascular tomorrow, has also seen them in past.  His caregiver and him report wishes to return to wound clinic.  Patient states he would prefer to save toe vs amputation if possible.  They request referral urgently.  His caregiver reports she is changing current dressing twice daily and placing abx ointment. Duration: 06/16/2019 initial visit Location: left great toe Painful: no Itching: no Onset: gradual Context: not changing, not improving  Relevant past medical, surgical, family and social history reviewed and updated as indicated. Interim medical history since our last visit reviewed. Allergies and medications reviewed and updated.  Review of Systems  Constitutional: Negative.   Respiratory: Negative.   Cardiovascular: Negative.   Gastrointestinal: Negative.   Skin: Positive for wound.  Neurological: Negative.   Psychiatric/Behavioral: Negative.     Per HPI unless specifically indicated above     Objective:    BP (!) 185/118   Pulse 85   Temp 97.7 F (36.5 C) (Oral)   SpO2 91%   Wt Readings from Last 3 Encounters:  06/16/19 251 lb (113.9 kg)  08/15/18 243 lb (110.2 kg)  06/27/18 245 lb 9.6 oz (111.4 kg)    Physical Exam Vitals and nursing note reviewed.  Constitutional:      General: He is  awake. He is not in acute distress.    Appearance: He is well-developed. He is obese. He is not ill-appearing.  HENT:     Head: Normocephalic and atraumatic.     Right Ear: Hearing normal. No drainage.     Left Ear: Hearing normal. No drainage.  Eyes:     General: Lids are normal.        Right eye: No discharge.        Left eye: No discharge.     Conjunctiva/sclera: Conjunctivae normal.     Pupils: Pupils are equal, round, and reactive to light.  Neck:     Vascular: No carotid bruit.  Cardiovascular:     Rate and Rhythm: Normal rate and regular rhythm.     Pulses:          Dorsalis pedis pulses are 1+ on the right side and 1+ on the left side.       Posterior tibial pulses are 1+ on the right side and 1+ on the left side.     Heart sounds: Normal heart sounds, S1 normal and S2 normal. No murmur. No gallop.   Pulmonary:     Effort: Pulmonary effort is normal. No accessory muscle usage or respiratory distress.     Breath sounds: Normal breath sounds.  Abdominal:     General: Bowel sounds are normal.  Palpations: Abdomen is soft.  Musculoskeletal:        General: Normal range of motion.     Cervical back: Normal range of motion and neck supple.     Right lower leg: 1+ Edema present.     Left lower leg: 2+ Edema present.       Feet:  Feet:     Right foot:     Skin integrity: Dry skin present.     Toenail Condition: Right toenails are abnormally thick.     Left foot:     Skin integrity: Ulcer and dry skin present.     Toenail Condition: Left toenails are abnormally thick.  Skin:    General: Skin is warm and dry.     Capillary Refill: Capillary refill takes less than 2 seconds.     Findings: Wound present.     Comments: Refer to foot assessment  Neurological:     Mental Status: He is alert and oriented to person, place, and time.  Psychiatric:        Mood and Affect: Mood normal.        Behavior: Behavior normal. Behavior is cooperative.        Thought Content: Thought  content normal.        Judgment: Judgment normal.     Results for orders placed or performed in visit on 06/18/19  Hemoglobin A1c  Result Value Ref Range   Hemoglobin A1C 6.4       Assessment & Plan:   Problem List Items Addressed This Visit      Endocrine   Diabetic ulcer of left great toe ([place]) - Primary    Ongoing.  Continue collaboration with vascular and podiatry.  Will place urgent referral to wound clinic for further evaluation and recommendations.  Suspect this will become ongoing issue due to circulation to BLE with diabetes.  Maintain goal control of diabetes.  Continue current wound care at home.  CCM referral.      Relevant Orders   Ambulatory referral to Wound Clinic   Referral to Chronic Care Management Services       Follow up plan: Return in about 6 weeks (around 08/25/2019) for With Dr. Wynetta Emery to obtain diabetic shoes (new).

## 2019-07-15 ENCOUNTER — Telehealth: Payer: Self-pay | Admitting: Family Medicine

## 2019-07-15 ENCOUNTER — Ambulatory Visit (INDEPENDENT_AMBULATORY_CARE_PROVIDER_SITE_OTHER): Payer: Medicare Other

## 2019-07-15 ENCOUNTER — Other Ambulatory Visit (INDEPENDENT_AMBULATORY_CARE_PROVIDER_SITE_OTHER): Payer: Self-pay | Admitting: Nurse Practitioner

## 2019-07-15 ENCOUNTER — Encounter (INDEPENDENT_AMBULATORY_CARE_PROVIDER_SITE_OTHER): Payer: Self-pay | Admitting: Nurse Practitioner

## 2019-07-15 ENCOUNTER — Encounter: Payer: Medicare Other | Attending: Physician Assistant | Admitting: Physician Assistant

## 2019-07-15 ENCOUNTER — Ambulatory Visit (INDEPENDENT_AMBULATORY_CARE_PROVIDER_SITE_OTHER): Payer: Medicare Other | Admitting: Nurse Practitioner

## 2019-07-15 VITALS — BP 184/77 | HR 90 | Resp 18 | Ht 76.0 in | Wt 257.0 lb

## 2019-07-15 DIAGNOSIS — L97529 Non-pressure chronic ulcer of other part of left foot with unspecified severity: Secondary | ICD-10-CM | POA: Diagnosis not present

## 2019-07-15 DIAGNOSIS — I120 Hypertensive chronic kidney disease with stage 5 chronic kidney disease or end stage renal disease: Secondary | ICD-10-CM | POA: Insufficient documentation

## 2019-07-15 DIAGNOSIS — N186 End stage renal disease: Secondary | ICD-10-CM | POA: Diagnosis not present

## 2019-07-15 DIAGNOSIS — E1122 Type 2 diabetes mellitus with diabetic chronic kidney disease: Secondary | ICD-10-CM | POA: Insufficient documentation

## 2019-07-15 DIAGNOSIS — Z8249 Family history of ischemic heart disease and other diseases of the circulatory system: Secondary | ICD-10-CM | POA: Diagnosis not present

## 2019-07-15 DIAGNOSIS — Z85528 Personal history of other malignant neoplasm of kidney: Secondary | ICD-10-CM | POA: Diagnosis not present

## 2019-07-15 DIAGNOSIS — M86672 Other chronic osteomyelitis, left ankle and foot: Secondary | ICD-10-CM | POA: Insufficient documentation

## 2019-07-15 DIAGNOSIS — F17290 Nicotine dependence, other tobacco product, uncomplicated: Secondary | ICD-10-CM | POA: Insufficient documentation

## 2019-07-15 DIAGNOSIS — Z8349 Family history of other endocrine, nutritional and metabolic diseases: Secondary | ICD-10-CM | POA: Diagnosis not present

## 2019-07-15 DIAGNOSIS — Z794 Long term (current) use of insulin: Secondary | ICD-10-CM | POA: Diagnosis not present

## 2019-07-15 DIAGNOSIS — E1140 Type 2 diabetes mellitus with diabetic neuropathy, unspecified: Secondary | ICD-10-CM | POA: Diagnosis not present

## 2019-07-15 DIAGNOSIS — L97909 Non-pressure chronic ulcer of unspecified part of unspecified lower leg with unspecified severity: Secondary | ICD-10-CM

## 2019-07-15 DIAGNOSIS — I10 Essential (primary) hypertension: Secondary | ICD-10-CM | POA: Diagnosis not present

## 2019-07-15 DIAGNOSIS — E1169 Type 2 diabetes mellitus with other specified complication: Secondary | ICD-10-CM | POA: Diagnosis not present

## 2019-07-15 DIAGNOSIS — I70299 Other atherosclerosis of native arteries of extremities, unspecified extremity: Secondary | ICD-10-CM | POA: Diagnosis not present

## 2019-07-15 DIAGNOSIS — Z823 Family history of stroke: Secondary | ICD-10-CM | POA: Insufficient documentation

## 2019-07-15 DIAGNOSIS — M069 Rheumatoid arthritis, unspecified: Secondary | ICD-10-CM | POA: Insufficient documentation

## 2019-07-15 DIAGNOSIS — Z833 Family history of diabetes mellitus: Secondary | ICD-10-CM | POA: Insufficient documentation

## 2019-07-15 DIAGNOSIS — E1151 Type 2 diabetes mellitus with diabetic peripheral angiopathy without gangrene: Secondary | ICD-10-CM | POA: Insufficient documentation

## 2019-07-15 DIAGNOSIS — L97522 Non-pressure chronic ulcer of other part of left foot with fat layer exposed: Secondary | ICD-10-CM | POA: Insufficient documentation

## 2019-07-15 DIAGNOSIS — E11621 Type 2 diabetes mellitus with foot ulcer: Secondary | ICD-10-CM | POA: Diagnosis not present

## 2019-07-15 DIAGNOSIS — F419 Anxiety disorder, unspecified: Secondary | ICD-10-CM | POA: Insufficient documentation

## 2019-07-15 DIAGNOSIS — Z809 Family history of malignant neoplasm, unspecified: Secondary | ICD-10-CM | POA: Diagnosis not present

## 2019-07-15 DIAGNOSIS — Z992 Dependence on renal dialysis: Secondary | ICD-10-CM | POA: Diagnosis not present

## 2019-07-15 NOTE — Progress Notes (Signed)
SUBJECTIVE:  Patient ID: Timothy Gibson, male    DOB: 04/04/1951, 69 y.o.   MRN: [id number] Chief Complaint  Patient presents with  . Follow-up    ultraound    HPI  Timothy Gibson is a 69 y.o. male that is referred by Dr. Cleda Mccreedy with podiatry with concern for a left lower extremity ulceration which is concerning for possible decreased perfusion.  The patient has an ulceration on his left great toe.  The patient previously visited wound center just prior to our office visit, and the patient refused to allow the wound dressing to be removed so I was not able to directly assess the wound, however it is described as a hemorrhagic, boggy ulceration.  The wound is reportedly full-thickness and no healthy bleeding was noted on debridement of the ulceration.  This was per the office note on 07/08/2019.  Patient states that he is currently concerned about possible amputation of the great toe.  He denies any fever, chills, nausea, vomiting or diarrhea.  Patient is a current smoker.  He does endorse having some swelling of the lower extremity.  The patient underwent a left lower extremity arterial duplex today which revealed biphasic waveforms down to the level of the distal popliteal artery.  The tibial vessels had monophasic flow throughout.  The anterior tibial artery had a jump in velocities to 337 at the distal portion, indicating a likely stenosis.  Past Medical History:  Diagnosis Date  . Cancer ([place])    right kidney  . Diabetes mellitus without complication ([place])   . Dialysis patient [facility])    on Mon-Wed-Fri dialysis  . ESRD (end stage renal disease) ([place])   . History of gangrene   . Hypertension     Past Surgical History:  Procedure Laterality Date  . A/V FISTULAGRAM Left 06/04/2018   Procedure: A/V FISTULAGRAM;  Surgeon: Katha Cabal, MD;  Location: [facility];  Service: Cardiovascular;  Laterality: Left;  . COLONOSCOPY WITH PROPOFOL N/A 12/16/2015   Procedure:  COLONOSCOPY WITH PROPOFOL;  Surgeon: Manya Silvas, MD;  Location: [facility];  Service: Endoscopy;  Laterality: N/A;  . KNEE SURGERY    . left forearm AV fistula    . NEPHRECTOMY      Social History   Socioeconomic History  . Marital status: Divorced    Spouse name: Not on file  . Number of children: Not on file  . Years of education: Not on file  . Highest education level: Not on file  Occupational History  . Not on file  Tobacco Use  . Smoking status: Current Some Day Smoker  . Smokeless tobacco: Former Systems developer  . Tobacco comment: cigar smoker  Substance and Sexual Activity  . Alcohol use: Not Currently  . Drug use: Never  . Sexual activity: Not Currently  Other Topics Concern  . Not on file  Social History Narrative   Independent, lives with brother at home.   Social Determinants of Health   Financial Resource Strain:   . Difficulty of Paying Living Expenses: Not on file  Food Insecurity:   . Worried About Charity fundraiser in the Last Year: Not on file  . Ran Out of Food in the Last Year: Not on file  Transportation Needs:   . Lack of Transportation (Medical): Not on file  . Lack of Transportation (Non-Medical): Not on file  Physical Activity:   . Days of Exercise per Week: Not on file  . Minutes of  Exercise per Session: Not on file  Stress:   . Feeling of Stress : Not on file  Social Connections:   . Frequency of Communication with Friends and Family: Not on file  . Frequency of Social Gatherings with Friends and Family: Not on file  . Attends Religious Services: Not on file  . Active Member of Clubs or Organizations: Not on file  . Attends Archivist Meetings: Not on file  . Marital Status: Not on file  Intimate Partner Violence:   . Fear of Current or Ex-Partner: Not on file  . Emotionally Abused: Not on file  . Physically Abused: Not on file  . Sexually Abused: Not on file    Family History  Problem Relation Age of Onset  .  Hypertension Father   . Colon cancer Father   . Hypertension Mother   . Parkinson's disease Mother   . Diabetes Mother   . Alzheimer's disease Mother     Allergies  Allergen Reactions  . Adhesive [Tape]     Pulls skin off.  Must use paper tape     Review of Systems   Review of Systems: Negative Unless Checked Constitutional: [] Weight loss  [] Fever  [] Chills Cardiac: [] Chest pain   []  Atrial Fibrillation  [] Palpitations   [] Shortness of breath when laying flat   [] Shortness of breath with exertion. [] Shortness of breath at rest Vascular:  [] Pain in legs with walking   [] Pain in legs with standing [] Pain in legs when laying flat   [] Claudication    [] Pain in feet when laying flat    [] History of DVT   [] Phlebitis   [x] Swelling in legs   [] Varicose veins   [x] Non-healing ulcers Pulmonary:   [] Uses home oxygen   [] Productive cough   [] Hemoptysis   [] Wheeze  [] COPD   [] Asthma Neurologic:  [] Dizziness   [] Seizures  [] Blackouts [] History of stroke   [] History of TIA  [] Aphasia   [] Temporary Blindness   [] Weakness or numbness in arm   [] Weakness or numbness in leg Musculoskeletal:   [] Joint swelling   [] Joint pain   [] Low back pain  []  History of Knee Replacement [] Arthritis [] back Surgeries  []  Spinal Stenosis    Hematologic:  [] Easy bruising  [] Easy bleeding   [] Hypercoagulable state   [x] Anemic Gastrointestinal:  [] Diarrhea   [] Vomiting  [] Gastroesophageal reflux/heartburn   [] Difficulty swallowing. [] Abdominal pain Genitourinary:  [x] Chronic kidney disease   [] Difficult urination  [] Anuric   [] Blood in urine [] Frequent urination  [] Burning with urination   [] Hematuria Skin:  [] Rashes   [] Ulcers [x] Wounds Psychological:  [] History of anxiety   []  History of major depression  []  Memory Difficulties      OBJECTIVE:   Physical Exam  BP (!) 184/77 (BP Location: Right Arm)   Pulse 90   Resp 18   Ht 6\' 4"  (1.93 m)   Wt 257 lb (116.6 kg)   BMI 31.28 kg/m   Gen: WD/WN, NAD Head: [place]/AT,  No temporalis wasting.  Ear/Nose/Throat: Hearing grossly intact, nares w/o erythema or drainage Eyes: PER, EOMI, sclera nonicteric.  Neck: Supple, no masses.  No JVD.  Pulmonary:  Good air movement, no use of accessory muscles.  Cardiac: RRR Vascular: left toes cool. 3+ edema LLE Vessel Right Left  Radial Palpable Palpable  Dorsalis Pedis Not Palpable Not Palpable  Posterior Tibial Not Palpable Not Palpable   Gastrointestinal: soft, non-distended. No guarding/no peritoneal signs.  Musculoskeletal: M/S 5/5 throughout.  No deformity or atrophy.  Neurologic: Pain  and light touch intact in extremities.  Symmetrical.  Speech is fluent. Motor exam as listed above. Psychiatric: Judgment intact, Mood & affect appropriate for pt's clinical situation. Dermatologic: No Venous rashes. No Ulcers Noted.  No changes consistent with cellulitis. Lymph : No Cervical lymphadenopathy, no lichenification or skin changes of chronic lymphedema.       ASSESSMENT AND PLAN:  1. Atherosclerosis of artery of extremity with ulceration (HCC)  Recommend:  The patient has evidence of severe atherosclerotic changes of both lower extremities associated with ulceration and tissue loss of the foot.  This represents a limb threatening ischemia and places the patient at the risk for limb loss.  Patient should undergo angiography of the lower extremities with the hope for intervention for limb salvage.  The risks and benefits as well as the alternative therapies was discussed in detail with the patient.  All questions were answered.  Patient agrees to proceed with angiography.  The patient will follow up with me in the office after the procedure.    2. Essential hypertension Continue antihypertensive medications as already ordered, these medications have been reviewed and there are no changes at this time.   3. End stage renal disease ([place]) Currently without issue   Current Outpatient Medications on File Prior to  Visit  Medication Sig Dispense Refill  . acetaminophen (TYLENOL) 500 MG tablet Take 500 mg by mouth daily as needed for moderate pain.    Marland Kitchen albuterol (VENTOLIN HFA) 108 (90 Base) MCG/ACT inhaler Inhale 2 puffs into the lungs every 6 (six) hours as needed for wheezing or shortness of breath. 18 g 5  . amLODipine (NORVASC) 5 MG tablet Take 1 tablet (5 mg total) by mouth daily. 90 tablet 1  . ANORO ELLIPTA 62.5-25 MCG/INH AEPB INHALE 1 PUFF INTO THE LUNGS DAILY 60 each 5  . EPINEPHrine 0.3 mg/0.3 mL IJ SOAJ injection Inject 0.3 mg into the muscle as needed for anaphylaxis.    Marland Kitchen insulin degludec (TRESIBA) 100 UNIT/ML SOPN FlexTouch Pen Inject 0.12 mLs (12 Units total) into the skin at bedtime. 6 mL 2  . losartan (COZAAR) 100 MG tablet Take 1 tablet (100 mg total) by mouth daily. 90 tablet 1  . sevelamer carbonate (RENVELA) 800 MG tablet Take 3,200 mg by mouth 3 (three) times daily with meals.      No current facility-administered medications on file prior to visit.    There are no Patient Instructions on file for this visit. No follow-ups on file.   Kris Hartmann, NP  This note was completed with Sales executive.  Any errors are purely unintentional.

## 2019-07-15 NOTE — Progress Notes (Signed)
Timothy, Gibson ([id number]) Visit Report for 07/15/2019 Abuse/Suicide Risk Screen Details Patient Name: Timothy Gibson, Timothy Gibson Date of Service: 07/15/2019 9:45 AM Medical Record Number: [id number] Patient Account Number: [id number] Date of Birth/Sex: August 09, 1950 (69 y.o. M) Treating RN: Army Melia Primary Care Aissa Lisowski: Merrie Roof Other Clinician: Referring Demetrias Goodbar: Merrie Roof Treating Loye Reininger/Extender: Melburn Hake, [name] Weeks in Treatment: 0 Abuse/Suicide Risk Screen Items Answer ABUSE RISK SCREEN: Has anyone close to you tried to hurt or harm you recentlyo No Do you feel uncomfortable with anyone in your familyo No Has anyone forced you do things that you didnot want to doo No Electronic Signature(s) Signed: 07/15/2019 4:03:57 PM By: Army Melia Entered By: Army Melia on 07/15/2019 09:47:08 Timothy Gibson ([id number]) -------------------------------------------------------------------------------- Activities of Daily Living Details Patient Name: Timothy Gibson Date of Service: 07/15/2019 9:45 AM Medical Record Number: [id number] Patient Account Number: [id number] Date of Birth/Sex: 07-21-1950 (68 y.o. M) Treating RN: Army Melia Primary Care Glenn Christo: Merrie Roof Other Clinician: Referring Graceann Boileau: Merrie Roof Treating Kamie Korber/Extender: Melburn Hake, [name] Weeks in Treatment: 0 Activities of Daily Living Items Answer Activities of Daily Living (Please select one for each item) Drive Automobile Completely Able Take Medications Completely Able Use Telephone Completely Able Care for Appearance Completely Able Use Toilet Completely Able Bath / Shower Completely Able Dress Self Completely Able Feed Self Completely Able Walk Completely Able Get In / Out Bed Completely Able Housework Completely Able Prepare Meals Completely [place] for Self Completely Able Electronic Signature(s) Signed: 07/15/2019 4:03:57 PM By: Army Melia Entered By:  Army Melia on 07/15/2019 09:47:21 Timothy Gibson ([id number]) -------------------------------------------------------------------------------- Education Screening Details Patient Name: Timothy Gibson Date of Service: 07/15/2019 9:45 AM Medical Record Number: [id number] Patient Account Number: [id number] Date of Birth/Sex: 07/13/1950 (68 y.o. M) Treating RN: Army Melia Primary Care Robbyn Hodkinson: Merrie Roof Other Clinician: Referring Nyasia Baxley: Merrie Roof Treating Carylon Tamburro/Extender: Melburn Hake, [name] Weeks in Treatment: 0 Primary Learner Assessed: Patient Learning Preferences/Education Level/Primary Language Learning Preference: Explanation Highest Education Level: College or Above Preferred Language: English Cognitive Barrier Language Barrier: No Translator Needed: No Memory Deficit: No Emotional Barrier: No Cultural/Religious Beliefs Affecting Medical Care: No Physical Barrier Impaired Vision: No Impaired Hearing: No Decreased Hand dexterity: No Knowledge/Comprehension Knowledge Level: High Comprehension Level: High Ability to understand written High instructions: Ability to understand verbal High instructions: Motivation Anxiety Level: Calm Cooperation: Cooperative Education Importance: Acknowledges Need Interest in Health Problems: Asks Questions Perception: Coherent Willingness to Engage in Self- High Management Activities: Readiness to Engage in Self- High Management Activities: Electronic Signature(s) Signed: 07/15/2019 4:03:57 PM By: Army Melia Entered By: Army Melia on 07/15/2019 09:47:52 AHAD, COLARUSSO ([id number]) -------------------------------------------------------------------------------- Fall Risk Assessment Details Patient Name: Timothy Gibson Date of Service: 07/15/2019 9:45 AM Medical Record Number: [id number] Patient Account Number: [id number] Date of Birth/Sex: [birth year] (69 y.o. M) Treating RN: Army Melia Primary Care  Lakiyah Arntson: Merrie Roof Other Clinician: Referring Dailee Manalang: Merrie Roof Treating Arnesia Vincelette/Extender: Melburn Hake, [name] Weeks in Treatment: 0 Fall Risk Assessment Items Have you had 2 or more falls in the last 12 monthso 0 No Have you had any fall that resulted in injury in the last 12 monthso 0 No FALLS RISK SCREEN History of falling - immediate or within 3 months 0 No Secondary diagnosis (Do you have 2 or more medical diagnoseso) 0 No Ambulatory aid None/bed rest/wheelchair/nurse 0 No Crutches/cane/walker 0 No Furniture 0 No Intravenous therapy Access/Saline/Heparin Lock 0 No Gait/Transferring Normal/ bed rest/ wheelchair 0 No Weak (  short steps with or without shuffle, stooped but able to lift head while 0 No walking, may seek support from furniture) Impaired (short steps with shuffle, may have difficulty arising from chair, head 0 No down, impaired balance) Mental Status Oriented to own ability 0 No Electronic Signature(s) Signed: 07/15/2019 4:03:57 PM By: Army Melia Entered By: Army Melia on 07/15/2019 09:47:57 Timothy Gibson ([id number]) -------------------------------------------------------------------------------- Foot Assessment Details Patient Name: Timothy Gibson Date of Service: 07/15/2019 9:45 AM Medical Record Number: [id number] Patient Account Number: [id number] Date of Birth/Sex: 07-22-1950 (69 y.o. M) Treating RN: Army Melia Primary Care Lynette Topete: Merrie Roof Other Clinician: Referring Naturi Alarid: Merrie Roof Treating Tahnee Cifuentes/Extender: Melburn Hake, [name] Weeks in Treatment: 0 Foot Assessment Items Site Locations + = Sensation present, - = Sensation absent, C = Callus, U = Ulcer R = Redness, W = Warmth, M = Maceration, PU = Pre-ulcerative lesion F = Fissure, S = Swelling, D = Dryness Assessment Right: Left: Other Deformity: No No Prior Foot Ulcer: No No Prior Amputation: No No Charcot Joint: No No Ambulatory Status: Ambulatory With Help Assistance  Device: Cane Gait: Steady Electronic Signature(s) Signed: 07/15/2019 4:03:57 PM By: Army Melia Entered By: Army Melia on 07/15/2019 09:49:01 Timothy Gibson ([id number]) -------------------------------------------------------------------------------- Nutrition Risk Screening Details Patient Name: Timothy Gibson Date of Service: 07/15/2019 9:45 AM Medical Record Number: [id number] Patient Account Number: [id number] Date of Birth/Sex: 1950/11/07 (68 y.o. M) Treating RN: Army Melia Primary Care Allisson Schindel: Merrie Roof Other Clinician: Referring Wang [place]: Merrie Roof Treating Jermar Colter/Extender: Melburn Hake, [name] Weeks in Treatment: 0 Height (in): 76 Weight (lbs): 244 Body Mass Index (BMI): 29.7 Nutrition Risk Screening Items Score Screening NUTRITION RISK SCREEN: I have an illness or condition that made me change the kind and/or amount of 0 No food I eat I eat fewer than two meals per day 0 No I eat few fruits and vegetables, or milk products 0 No I have three or more drinks of beer, liquor or wine almost every day 0 No I have tooth or mouth problems that make it hard for me to eat 0 No I don't always have enough money to buy the food I need 0 No I eat alone most of the time 0 No I take three or more different prescribed or over-the-counter drugs a day 0 No Without wanting to, I have lost or gained 10 pounds in the last six months 0 No I am not always physically able to shop, cook and/or feed myself 0 No Nutrition Protocols Good Risk Protocol 0 No interventions needed Moderate Risk Protocol High Risk Proctocol Risk Level: Good Risk Score: 0 Electronic Signature(s) Signed: 07/15/2019 4:03:57 PM By: Army Melia Entered By: Army Melia on 07/15/2019 09:48:03

## 2019-07-15 NOTE — Chronic Care Management (AMB) (Signed)
  Chronic Care Management   Note  07/15/2019 Name: Timothy Gibson MRN: [id number] DOB: 01-03-1951  KAMAURY CUTBIRTH is a 69 y.o. year old male who is a primary care patient of Volney American, [place]. I reached out to [name] by phone today in response to a referral sent by Mr. Rhylee Nunn Christensen's PCP, Merrie Roof PA     Mr. Pries was given information about Chronic Care Management services today including:  1. CCM service includes personalized support from designated clinical staff supervised by his physician, including individualized plan of care and coordination with other care providers 2. 24/7 contact phone numbers for assistance for urgent and routine care needs. 3. Service will only be billed when office clinical staff spend 20 minutes or more in a month to coordinate care. 4. Only one practitioner may furnish and bill the service in a calendar month. 5. The patient may stop CCM services at any time (effective at the end of the month) by phone call to the office staff. 6. The patient will be responsible for cost sharing (co-pay) of up to 20% of the service fee (after annual deductible is met).  Patient did not agree to enrollment in care management services and does not wish to consider at this time. Patient states he is going to be having surgery and wants to wait until that is taken care of before considering.  Follow up plan: The care management team will reach out to the patient again over the next 30 days.   Glenna Durand, LPN Health Advisor, Embedded Care Coordination [facility] ??[email or web address] ??[phone]

## 2019-07-15 NOTE — Progress Notes (Signed)
Timothy Gibson, Timothy Gibson ([id number]) Visit Report for 07/15/2019 Allergy List Details Patient Name: Timothy Gibson, Timothy Gibson Date of Service: 07/15/2019 9:45 AM Medical Record Number: [id number] Patient Account Number: [id number] Date of Birth/Sex: [birth year] (69 y.o. M) Treating RN: Timothy Gibson Primary Care Timothy Gibson: Timothy Gibson Other Clinician: Referring Timothy Gibson: Timothy Gibson Treating Timothy Gibson/Extender: Timothy Gibson, Timothy Gibson Weeks in Treatment: 0 Allergies Active Allergies No Known Drug Allergies Allergy Notes Electronic Signature(s) Signed: 07/15/2019 4:56:10 PM By: Timothy Gibson Entered By: Timothy Gibson on 07/15/2019 10:24:13 Timothy Gibson ([id number]) -------------------------------------------------------------------------------- Arrival Information Details Patient Name: Timothy Gibson Date of Service: 07/15/2019 9:45 AM Medical Record Number: [id number] Patient Account Number: [id number] Date of Birth/Sex: 10-12-1950 (69 y.o. M) Treating RN: Timothy Gibson Primary Care Timothy Gibson: Timothy Gibson Other Clinician: Referring Timothy Gibson: Timothy Gibson Treating Timothy Gibson/Extender: Timothy Gibson Weeks in Treatment: 0 Visit Information Patient Arrived: Cane Arrival Time: 09:37 Accompanied By: friend Transfer Assistance: None Patient Identification Verified: Yes Secondary Verification Process Completed: Yes History Since Last Visit Added or deleted any medications: No Any new allergies or adverse reactions: No Had a fall or experienced change in activities of daily living that may affect risk of falls: No Signs or symptoms of abuse/neglect since last visito No Hospitalized since last visit: No Implantable device outside of the clinic excluding cellular tissue based products placed in the center since last visit: No Electronic Signature(s) Signed: 07/15/2019 3:01:49 PM By: Timothy Gibson RCP, RRT, CHT Entered By: Timothy Gibson on 07/15/2019 09:38:28 Timothy Gibson  ([id number]) -------------------------------------------------------------------------------- Clinic Level of Care Assessment Details Patient Name: Timothy Gibson Date of Service: 07/15/2019 9:45 AM Medical Record Number: [id number] Patient Account Number: [id number] Date of Birth/Sex: [birth year] (68 y.o. M) Treating RN: Timothy Gibson Primary Care Timothy Gibson: Timothy Gibson Other Clinician: Referring Timothy Gibson: Timothy Gibson Treating Timothy Gibson/Extender: Timothy Gibson Weeks in Treatment: 0 Clinic Level of Care Assessment Items TOOL 2 Quantity Score []  - Use when only an EandM is performed on the INITIAL visit 0 ASSESSMENTS - Nursing Assessment / Reassessment X - General Physical Exam (combine w/ comprehensive assessment (listed just below) when 1 20 performed on new pt. evals) X- 1 25 Comprehensive Assessment (HX, ROS, Risk Assessments, Wounds Hx, etc.) ASSESSMENTS - Wound and Skin Assessment / Reassessment X - Simple Wound Assessment / Reassessment - one wound 1 5 []  - 0 Complex Wound Assessment / Reassessment - multiple wounds []  - 0 Dermatologic / Skin Assessment (not related to wound area) ASSESSMENTS - Ostomy and/or Continence Assessment and Care []  - Incontinence Assessment and Management 0 []  - 0 Ostomy Care Assessment and Management (repouching, etc.) PROCESS - Coordination of Care X - Simple Patient / Family Education for ongoing care 1 15 []  - 0 Complex (extensive) Patient / Family Education for ongoing care X- 1 10 Staff obtains Programmer, systems, Records, Test Results / Process Orders []  - 0 Staff telephones HHA, Nursing Homes / Clarify orders / etc []  - 0 Routine Transfer to another Facility (non-emergent condition) []  - 0 Routine Hospital Admission (non-emergent condition) []  - 0 New Admissions / Biomedical engineer / Ordering NPWT, Apligraf, etc. []  - 0 Emergency Hospital Admission (emergent condition) X- 1 10 Simple Discharge Coordination []  - 0 Complex  (extensive) Discharge Coordination PROCESS - Special Needs []  - Pediatric / Minor Patient Management 0 []  - 0 Isolation Patient Management Timothy Gibson. ([id number]) []  - 0 Hearing / Language / Visual special needs []  - 0 Assessment of Community assistance (transportation, D/C planning, etc.) []  -  0 Additional assistance / Altered mentation []  - 0 Support Surface(s) Assessment (bed, cushion, seat, etc.) INTERVENTIONS - Wound Cleansing / Measurement X - Wound Imaging (photographs - any number of wounds) 1 5 []  - 0 Wound Tracing (instead of photographs) X- 1 5 Simple Wound Measurement - one wound []  - 0 Complex Wound Measurement - multiple wounds X- 1 5 Simple Wound Cleansing - one wound []  - 0 Complex Wound Cleansing - multiple wounds INTERVENTIONS - Wound Dressings X - Small Wound Dressing one or multiple wounds 1 10 []  - 0 Medium Wound Dressing one or multiple wounds []  - 0 Large Wound Dressing one or multiple wounds []  - 0 Application of Medications - injection INTERVENTIONS - Miscellaneous []  - External ear exam 0 []  - 0 Specimen Collection (cultures, biopsies, blood, body fluids, etc.) []  - 0 Specimen(s) / Culture(s) sent or taken to Lab for analysis []  - 0 Patient Transfer (multiple staff / Civil Service fast streamer / Similar devices) []  - 0 Simple Staple / Suture removal (25 or less) []  - 0 Complex Staple / Suture removal (26 or more) []  - 0 Hypo / Hyperglycemic Management (close monitor of Blood Glucose) []  - 0 Ankle / Brachial Index (ABI) - do not check if billed separately Has the patient been seen at the hospital within the last three years: Yes Total Score: 110 Level Of Care: New/Established - Level 3 Electronic Signature(s) Signed: 07/15/2019 4:56:10 PM By: Timothy Gibson Entered By: Timothy Gibson on 07/15/2019 10:26:55 Timothy Gibson ([id number]) -------------------------------------------------------------------------------- Encounter Discharge  Information Details Patient Name: Timothy Gibson Date of Service: 07/15/2019 9:45 AM Medical Record Number: [id number] Patient Account Number: [id number] Date of Birth/Sex: [birth year] (69 y.o. M) Treating RN: Timothy Gibson Primary Care Floraine Buechler: Timothy Gibson Other Clinician: Referring Inocencio Roy: Timothy Gibson Treating Florena Kozma/Extender: Timothy Gibson Weeks in Treatment: 0 Encounter Discharge Information Items Post Procedure Vitals Discharge Condition: Stable Temperature (F): 98.6 Ambulatory Status: Cane Pulse (bpm): 79 Discharge Destination: Home Respiratory Rate (breaths/min): 16 Transportation: Private Auto Blood Pressure (mmHg): 174/74 Accompanied By: care giver Schedule Follow-up Appointment: Yes Clinical Summary of Care: Electronic Signature(s) Signed: 07/15/2019 4:56:10 PM By: Timothy Gibson Entered By: Timothy Gibson on 07/15/2019 10:29:38 Timothy Gibson ([id number]) -------------------------------------------------------------------------------- Lower Extremity Assessment Details Patient Name: Timothy Gibson Date of Service: 07/15/2019 9:45 AM Medical Record Number: [id number] Patient Account Number: [id number] Date of Birth/Sex: [birth year] (68 y.o. M) Treating RN: Timothy Gibson Primary Care Casen Pryor: Timothy Gibson Other Clinician: Referring Keziyah Kneale: Timothy Gibson Treating Carlethia Mesquita/Extender: Timothy Gibson Weeks in Treatment: 0 Edema Assessment Assessed: [Left: No] [Right: No] Edema: [Left: N] [Right: o] Calf Left: Right: Point of Measurement: 38 cm From Medial Instep 43 cm cm Ankle Left: Right: Point of Measurement: 14 cm From Medial Instep 29 cm cm Vascular Assessment Pulses: Dorsalis Pedis Palpable: [Left:Yes] Electronic Signature(s) Signed: 07/15/2019 4:03:57 PM By: Timothy Gibson Entered By: Timothy Gibson on 07/15/2019 09:53:19 Timothy Gibson ([id number]) -------------------------------------------------------------------------------- Multi Wound  Chart Details Patient Name: Timothy Gibson Date of Service: 07/15/2019 9:45 AM Medical Record Number: [id number] Patient Account Number: [id number] Date of Birth/Sex: [birth year] (69 y.o. M) Treating RN: Timothy Gibson Primary Care Gen Clagg: Timothy Gibson Other Clinician: Referring Laythan Hayter: Timothy Gibson Treating Zayla Agar/Extender: Timothy Gibson Weeks in Treatment: 0 Vital Signs Height(in): 76 Pulse(bpm): 79 Weight(lbs): 244 Blood Pressure(mmHg): 175/74 Body Mass Index(BMI): 30 Temperature(F): 98.6 Respiratory Rate 16 (breaths/min): Photos: [N/A:N/A] Wound Location: Left Toe Great N/A N/A Wounding Event: Gradually Appeared N/A N/A Primary Etiology: Diabetic Wound/Ulcer of  the N/A N/A Lower Extremity Comorbid History: Lymphedema, Arrhythmia, N/A N/A Hypertension, Type II Diabetes, End Stage Renal Disease, Rheumatoid Arthritis, Osteoarthritis, Confinement Anxiety Date Acquired: 06/19/2019 N/A N/A Weeks of Treatment: 0 N/A N/A Wound Status: Open N/A N/A Measurements L x W x D 1.5x2.3x0.1 N/A N/A (cm) Area (cm) : 2.71 N/A N/A Volume (cm) : 0.271 N/A N/A Classification: Grade 2 N/A N/A Exudate Amount: Medium N/A N/A Exudate Type: Serosanguineous N/A N/A Exudate Color: red, brown N/A N/A Granulation Amount: Small (1-33%) N/A N/A Granulation Quality: Red N/A N/A Necrotic Amount: Large (67-100%) N/A N/A Necrotic Tissue: Eschar, Adherent Slough N/A N/A Exposed Structures: Fat Layer (Subcutaneous N/A N/A Tissue) Exposed: Yes Fascia: No Tendon: No Timothy Gibson, Timothy Gibson ([id number]) Muscle: No Joint: No Bone: No Epithelialization: None N/A N/A Treatment Notes Electronic Signature(s) Signed: 07/15/2019 4:56:10 PM By: Timothy Gibson Entered By: Timothy Gibson on 07/15/2019 10:17:57 Timothy Gibson ([id number]) -------------------------------------------------------------------------------- Multi-Disciplinary Care Plan Details Patient Name: Timothy Gibson Date of  Service: 07/15/2019 9:45 AM Medical Record Number: [id number] Patient Account Number: [id number] Date of Birth/Sex: 1950/12/12 (69 y.o. M) Treating RN: Timothy Gibson Primary Care Stacey Maura: Timothy Gibson Other Clinician: Referring Quantavious Eggert: Timothy Gibson Treating Lynnelle Mesmer/Extender: Timothy Gibson Weeks in Treatment: 0 Active Inactive Abuse / Safety / Falls / Self Care Management Nursing Diagnoses: Potential for falls Goals: Patient will not experience any injury related to falls Date Initiated: 07/15/2019 Target Resolution Date: 10/18/2019 Goal Status: Active Interventions: Assess fall risk on admission and as needed Notes: Necrotic Tissue Nursing Diagnoses: Impaired tissue integrity related to necrotic/devitalized tissue Goals: Necrotic/devitalized tissue will be minimized in the wound bed Date Initiated: 07/15/2019 Target Resolution Date: 10/18/2019 Goal Status: Active Interventions: Provide education on necrotic tissue and debridement process Notes: Orientation to the Wound Care Program Nursing Diagnoses: Knowledge deficit related to the wound healing center program Goals: Patient/caregiver will verbalize understanding of the [place] [facility] Program Date Initiated: 07/15/2019 Target Resolution Date: 10/18/2019 Goal Status: Active Interventions: Provide education on orientation to the wound center Timothy Gibson, Timothy Gibson ([id number]) Notes: Peripheral Neuropathy Nursing Diagnoses: Knowledge deficit related to disease process and management of peripheral neurovascular dysfunction Goals: Patient/caregiver will verbalize understanding of disease process and disease management Date Initiated: 07/15/2019 Target Resolution Date: 10/18/2019 Goal Status: Active Interventions: Assess signs and symptoms of neuropathy upon admission and as needed Provide education on Management of Neuropathy and Related Ulcers Notes: Wound/Skin Impairment Nursing Diagnoses: Impaired tissue  integrity Goals: Ulcer/skin breakdown will heal within 14 weeks Date Initiated: 07/15/2019 Target Resolution Date: 10/18/2019 Goal Status: Active Interventions: Assess patient/caregiver ability to obtain necessary supplies Assess patient/caregiver ability to perform ulcer/skin care regimen upon admission and as needed Assess ulceration(s) every visit Notes: Electronic Signature(s) Signed: 07/15/2019 4:56:10 PM By: Timothy Gibson Entered By: Timothy Gibson on 07/15/2019 10:17:34 Timothy Gibson ([id number]) -------------------------------------------------------------------------------- Pain Assessment Details Patient Name: Timothy Gibson Date of Service: 07/15/2019 9:45 AM Medical Record Number: [id number] Patient Account Number: [id number] Date of Birth/Sex: 05-05-1951 (69 y.o. M) Treating RN: Timothy Gibson Primary Care Kashius Dominic: Timothy Gibson Other Clinician: Referring Kosha Jaquith: Timothy Gibson Treating Emiline Mancebo/Extender: Timothy Gibson Weeks in Treatment: 0 Active Problems Location of Pain Severity and Description of Pain Patient Has Paino No Site Locations Pain Management and Medication Current Pain Management: Electronic Signature(s) Signed: 07/15/2019 3:01:49 PM By: Paulla Fore, RRT, CHT Signed: 07/15/2019 4:56:10 PM By: Timothy Gibson Entered By: Timothy Gibson on 07/15/2019 09:38:41 Timothy Gibson ([id number]) -------------------------------------------------------------------------------- Patient/Caregiver Education Details Patient Name: Timothy Bride  W. Date of Service: 07/15/2019 9:45 AM Medical Record Number: [id number] Patient Account Number: [id number] Date of Birth/Gender: [birth year] (69 y.o. M) Treating RN: Timothy Gibson Primary Care Physician: Timothy Gibson Other Clinician: Referring Physician: Merrie Gibson Treating Physician/Extender: Sharalyn Ink in Treatment: 0 Education Assessment Education Provided To: Patient  and Caregiver Education Topics Provided Offloading: Handouts: Other: need for offloading Methods: Explain/Verbal Responses: State content correctly Wound/Skin Impairment: Handouts: Other: wound care as ordered Methods: Demonstration, Explain/Verbal Responses: State content correctly Electronic Signature(s) Signed: 07/15/2019 4:56:10 PM By: Timothy Gibson Entered By: Timothy Gibson on 07/15/2019 10:19:30 Timothy Gibson ([id number]) -------------------------------------------------------------------------------- Wound Assessment Details Patient Name: Timothy Gibson Date of Service: 07/15/2019 9:45 AM Medical Record Number: [id number] Patient Account Number: [id number] Date of Birth/Sex: 1951/02/01 (69 y.o. M) Treating RN: Timothy Gibson Primary Care Dariona Postma: Timothy Gibson Other Clinician: Referring Colten Desroches: Timothy Gibson Treating Ormand Senn/Extender: Timothy Gibson Weeks in Treatment: 0 Wound Status Wound Number: 3 Primary Diabetic Wound/Ulcer of the Lower Extremity Etiology: Wound Location: Left Toe Great Wound Open Wounding Event: Gradually Appeared Status: Date Acquired: 06/19/2019 Comorbid Lymphedema, Arrhythmia, Hypertension, Type II Weeks Of Treatment: 0 History: Diabetes, End Stage Renal Disease, Clustered Wound: No Rheumatoid Arthritis, Osteoarthritis, Confinement Anxiety Photos Wound Measurements Length: (cm) 1.5 % Reduction i Width: (cm) 2.3 % Reduction i Depth: (cm) 0.1 Epithelializa Area: (cm) 2.71 Tunneling: Volume: (cm) 0.271 Undermining: n Area: n Volume: tion: None No No Wound Description Classification: Grade 2 Foul Odor Aft Exudate Amount: Medium Slough/Fibrin Exudate Type: Serosanguineous Exudate Color: red, brown er Cleansing: No o Yes Wound Bed Granulation Amount: Small (1-33%) Exposed Structure Granulation Quality: Red Fascia Exposed: No Necrotic Amount: Large (67-100%) Fat Layer (Subcutaneous Tissue) Exposed: Yes Necrotic Quality:  Eschar, Adherent Slough Tendon Exposed: No Muscle Exposed: No Joint Exposed: No Bone Exposed: No Treatment Notes TYLAN, KINN ([id number]) Wound #3 (Left Toe Great) Notes iodoflex, foam, conform and coban to secure Electronic Signature(s) Signed: 07/15/2019 4:03:57 PM By: Timothy Gibson Entered By: Timothy Gibson on 07/15/2019 09:51:06 Timothy Gibson ([id number]) -------------------------------------------------------------------------------- Vitals Details Patient Name: Timothy Gibson Date of Service: 07/15/2019 9:45 AM Medical Record Number: [id number] Patient Account Number: [id number] Date of Birth/Sex: 04/11/1951 (69 y.o. M) Treating RN: Timothy Gibson Primary Care Cornell Bourbon: Timothy Gibson Other Clinician: Referring Ludmilla Mcgillis: Timothy Gibson Treating Cherry Wittwer/Extender: Timothy Gibson Weeks in Treatment: 0 Vital Signs Time Taken: 09:40 Temperature (F): 98.6 Height (in): 76 Pulse (bpm): 79 Source: Stated Respiratory Rate (breaths/min): 16 Weight (lbs): 244 Blood Pressure (mmHg): 175/74 Source: Measured Reference Range: 80 - 120 mg / dl Body Mass Index (BMI): 29.7 Electronic Signature(s) Signed: 07/15/2019 3:01:49 PM By: Timothy Gibson RCP, RRT, CHT Entered By: Becky Sax, Amado Nash on 07/15/2019 09:42:28

## 2019-07-16 ENCOUNTER — Telehealth (INDEPENDENT_AMBULATORY_CARE_PROVIDER_SITE_OTHER): Payer: Self-pay

## 2019-07-16 ENCOUNTER — Other Ambulatory Visit: Payer: Self-pay | Admitting: Physician Assistant

## 2019-07-16 ENCOUNTER — Ambulatory Visit
Admission: RE | Admit: 2019-07-16 | Discharge: 2019-07-16 | Disposition: A | Discharge status: Home / Self Care | Payer: Medicare Other | Source: Ambulatory Visit | Attending: Physician Assistant | Admitting: Physician Assistant

## 2019-07-16 ENCOUNTER — Other Ambulatory Visit: Payer: Self-pay

## 2019-07-16 ENCOUNTER — Ambulatory Visit
Admission: RE | Admit: 2019-07-16 | Discharge: 2019-07-16 | Disposition: A | Discharge status: Home / Self Care | Payer: Medicare Other | Attending: Physician Assistant | Admitting: Physician Assistant

## 2019-07-16 DIAGNOSIS — Z992 Dependence on renal dialysis: Secondary | ICD-10-CM | POA: Diagnosis not present

## 2019-07-16 DIAGNOSIS — D631 Anemia in chronic kidney disease: Secondary | ICD-10-CM | POA: Diagnosis not present

## 2019-07-16 DIAGNOSIS — S91109A Unspecified open wound of unspecified toe(s) without damage to nail, initial encounter: Secondary | ICD-10-CM | POA: Diagnosis not present

## 2019-07-16 DIAGNOSIS — D509 Iron deficiency anemia, unspecified: Secondary | ICD-10-CM | POA: Diagnosis not present

## 2019-07-16 DIAGNOSIS — N2581 Secondary hyperparathyroidism of renal origin: Secondary | ICD-10-CM | POA: Diagnosis not present

## 2019-07-16 DIAGNOSIS — N186 End stage renal disease: Secondary | ICD-10-CM | POA: Diagnosis not present

## 2019-07-16 NOTE — Telephone Encounter (Signed)
Spoke with Timothy Gibson patient's caregiver and the patient is now scheduled with Dr. Lucky Cowboy for left leg angio on 07/24/19 with a 8:45 am arrival time to the [facility]. Patient will do covid testing on 07/22/19 between 12:30-2:30 pm at the [place]. Pre-procedure instructions were discussed and will be mailed.

## 2019-07-17 ENCOUNTER — Other Ambulatory Visit (HOSPITAL_COMMUNITY): Payer: Self-pay | Admitting: Physician Assistant

## 2019-07-17 ENCOUNTER — Encounter: Payer: Medicare Other | Admitting: Physician Assistant

## 2019-07-17 ENCOUNTER — Other Ambulatory Visit: Payer: Self-pay | Admitting: Physician Assistant

## 2019-07-17 DIAGNOSIS — N186 End stage renal disease: Secondary | ICD-10-CM | POA: Diagnosis not present

## 2019-07-17 DIAGNOSIS — L97522 Non-pressure chronic ulcer of other part of left foot with fat layer exposed: Secondary | ICD-10-CM | POA: Diagnosis not present

## 2019-07-17 DIAGNOSIS — M069 Rheumatoid arthritis, unspecified: Secondary | ICD-10-CM | POA: Diagnosis not present

## 2019-07-17 DIAGNOSIS — S91102A Unspecified open wound of left great toe without damage to nail, initial encounter: Secondary | ICD-10-CM | POA: Diagnosis not present

## 2019-07-17 DIAGNOSIS — E1122 Type 2 diabetes mellitus with diabetic chronic kidney disease: Secondary | ICD-10-CM | POA: Diagnosis not present

## 2019-07-17 DIAGNOSIS — E11621 Type 2 diabetes mellitus with foot ulcer: Secondary | ICD-10-CM | POA: Diagnosis not present

## 2019-07-17 DIAGNOSIS — I120 Hypertensive chronic kidney disease with stage 5 chronic kidney disease or end stage renal disease: Secondary | ICD-10-CM | POA: Diagnosis not present

## 2019-07-17 NOTE — Progress Notes (Signed)
ANVAY, TENNIS ([id number]) Visit Report for 07/17/2019 Arrival Information Details Patient Name: Timothy Gibson, Timothy Gibson Date of Service: 07/17/2019 2:30 PM Medical Record Number: [id number] Patient Account Number: [id number] Date of Birth/Sex: 22-May-1951 (69 y.o. M) Treating RN: Army Melia Primary Care Jeriah Skufca: Merrie Roof Other Clinician: Referring Iwalani Templeton: Merrie Roof Treating Gianlucas Evenson/Extender: Melburn Hake, [name] Weeks in Treatment: 0 Visit Information History Since Last Visit Added or deleted any medications: No Patient Arrived: Cane Any new allergies or adverse reactions: No Arrival Time: 14:24 Had a fall or experienced change in No Accompanied By: caregiver activities of daily living that may affect Transfer Assistance: None risk of falls: Patient Identification Verified: Yes Signs or symptoms of abuse/neglect since last visito No Secondary Verification Process Completed: Yes Hospitalized since last visit: No Implantable device outside of the clinic excluding No cellular tissue based products placed in the center since last visit: Has Dressing in Place as Prescribed: Yes Pain Present Now: No Electronic Signature(s) Signed: 07/17/2019 4:13:51 PM By: Lorine Bears RCP, RRT, CHT Entered By: Lorine Bears on 07/17/2019 14:25:02 Timothy Gibson ([id number]) -------------------------------------------------------------------------------- Clinic Level of Care Assessment Details Patient Name: Timothy Gibson Date of Service: 07/17/2019 2:30 PM Medical Record Number: [id number] Patient Account Number: [id number] Date of Birth/Sex: 12-26-1950 (68 y.o. M) Treating RN: Army Melia Primary Care Clorene Nerio: Merrie Roof Other Clinician: Referring Micaela Stith: Merrie Roof Treating Kanoa Phillippi/Extender: Melburn Hake, [name] Weeks in Treatment: 0 Clinic Level of Care Assessment Items TOOL 4 Quantity Score []  - Use when only an EandM is performed on FOLLOW-UP visit  0 ASSESSMENTS - Nursing Assessment / Reassessment X - Reassessment of Co-morbidities (includes updates in patient status) 1 10 X- 1 5 Reassessment of Adherence to Treatment Plan ASSESSMENTS - Wound and Skin Assessment / Reassessment X - Simple Wound Assessment / Reassessment - one wound 1 5 []  - 0 Complex Wound Assessment / Reassessment - multiple wounds []  - 0 Dermatologic / Skin Assessment (not related to wound area) ASSESSMENTS - Focused Assessment []  - Circumferential Edema Measurements - multi extremities 0 []  - 0 Nutritional Assessment / Counseling / Intervention X- 1 5 Lower Extremity Assessment (monofilament, tuning fork, pulses) []  - 0 Peripheral Arterial Disease Assessment (using hand held doppler) ASSESSMENTS - Ostomy and/or Continence Assessment and Care []  - Incontinence Assessment and Management 0 []  - 0 Ostomy Care Assessment and Management (repouching, etc.) PROCESS - Coordination of Care X - Simple Patient / Family Education for ongoing care 1 15 []  - 0 Complex (extensive) Patient / Family Education for ongoing care []  - 0 Staff obtains Programmer, systems, Records, Test Results / Process Orders []  - 0 Staff telephones HHA, Nursing Homes / Clarify orders / etc []  - 0 Routine Transfer to another Facility (non-emergent condition) []  - 0 Routine Hospital Admission (non-emergent condition) []  - 0 New Admissions / Biomedical engineer / Ordering NPWT, Apligraf, etc. []  - 0 Emergency Hospital Admission (emergent condition) X- 1 10 Simple Discharge Coordination Timothy Gibson, Timothy Gibson ([id number]) []  - 0 Complex (extensive) Discharge Coordination PROCESS - Special Needs []  - Pediatric / Minor Patient Management 0 []  - 0 Isolation Patient Management []  - 0 Hearing / Language / Visual special needs []  - 0 Assessment of Community assistance (transportation, D/C planning, etc.) []  - 0 Additional assistance / Altered mentation []  - 0 Support Surface(s) Assessment (bed,  cushion, seat, etc.) INTERVENTIONS - Wound Cleansing / Measurement X - Simple Wound Cleansing - one wound 1 5 []  - 0 Complex Wound Cleansing - multiple wounds  X- 1 5 Wound Imaging (photographs - any number of wounds) []  - 0 Wound Tracing (instead of photographs) X- 1 5 Simple Wound Measurement - one wound []  - 0 Complex Wound Measurement - multiple wounds INTERVENTIONS - Wound Dressings []  - Small Wound Dressing one or multiple wounds 0 X- 1 15 Medium Wound Dressing one or multiple wounds []  - 0 Large Wound Dressing one or multiple wounds []  - 0 Application of Medications - topical []  - 0 Application of Medications - injection INTERVENTIONS - Miscellaneous []  - External ear exam 0 []  - 0 Specimen Collection (cultures, biopsies, blood, body fluids, etc.) []  - 0 Specimen(s) / Culture(s) sent or taken to Lab for analysis []  - 0 Patient Transfer (multiple staff / Civil Service fast streamer / Similar devices) []  - 0 Simple Staple / Suture removal (25 or less) []  - 0 Complex Staple / Suture removal (26 or more) []  - 0 Hypo / Hyperglycemic Management (close monitor of Blood Glucose) []  - 0 Ankle / Brachial Index (ABI) - do not check if billed separately X- 1 5 Vital Signs Timothy Gibson, Timothy Gibson ([id number]) Has the patient been seen at the hospital within the last three years: Yes Total Score: 85 Level Of Care: New/Established - Level 3 Electronic Signature(s) Signed: 07/17/2019 5:01:42 PM By: Army Melia Entered By: Army Melia on 07/17/2019 15:13:58 Timothy Gibson ([id number]) -------------------------------------------------------------------------------- Encounter Discharge Information Details Patient Name: Timothy Gibson Date of Service: 07/17/2019 2:30 PM Medical Record Number: [id number] Patient Account Number: [id number] Date of Birth/Sex: 27-Mar-1951 (69 y.o. M) Treating RN: Army Melia Primary Care Maziah Keeling: Merrie Roof Other Clinician: Referring Karver Fadden: Merrie Roof Treating Belton Peplinski/Extender: Melburn Hake, [name] Weeks in Treatment: 0 Encounter Discharge Information Items Discharge Condition: Stable Ambulatory Status: Ambulatory Discharge Destination: Home Transportation: Private Auto Accompanied By: family Schedule Follow-up Appointment: Yes Clinical Summary of Care: Electronic Signature(s) Signed: 07/17/2019 5:01:42 PM By: Army Melia Entered By: Army Melia on 07/17/2019 15:14:56 Timothy Gibson ([id number]) -------------------------------------------------------------------------------- Lower Extremity Assessment Details Patient Name: Timothy Gibson Date of Service: 07/17/2019 2:30 PM Medical Record Number: [id number] Patient Account Number: [id number] Date of Birth/Sex: April 13, 1951 (68 y.o. M) Treating RN: Cornell Barman Primary Care Saul Dorsi: Merrie Roof Other Clinician: Referring Armida Vickroy: Merrie Roof Treating Rhyann Berton/Extender: Melburn Hake, [name] Weeks in Treatment: 0 Edema Assessment Assessed: [Left: Yes] [Right: No] Edema: [Left: N] [Right: o] Vascular Assessment Pulses: Dorsalis Pedis Palpable: [Left:Yes] Electronic Signature(s) Signed: 07/17/2019 5:13:22 PM By: Gretta Cool, BSN, RN, CWS, Kim RN, BSN Entered By: Gretta Cool, BSN, RN, CWS, Kim on 07/17/2019 14:36:56 Timothy Gibson ([id number]) -------------------------------------------------------------------------------- Multi Wound Chart Details Patient Name: Timothy Gibson Date of Service: 07/17/2019 2:30 PM Medical Record Number: [id number] Patient Account Number: [id number] Date of Birth/Sex: 04/21/1951 (68 y.o. M) Treating RN: Army Melia Primary Care Danyale Ridinger: Merrie Roof Other Clinician: Referring Dannon Nguyenthi: Merrie Roof Treating Cola Gane/Extender: Melburn Hake, [name] Weeks in Treatment: 0 Vital Signs Height(in): 76 Pulse(bpm): 97 Weight(lbs): 244 Blood Pressure(mmHg): 183/93 Body Mass Index(BMI): 30 Temperature(F): 98.0 Respiratory Rate 18 (breaths/min): Photos:  [N/A:N/A] Wound Location: Left Toe Great N/A N/A Wounding Event: Gradually Appeared N/A N/A Primary Etiology: Diabetic Wound/Ulcer of the N/A N/A Lower Extremity Comorbid History: Lymphedema, Arrhythmia, N/A N/A Hypertension, Type II Diabetes, End Stage Renal Disease, Rheumatoid Arthritis, Osteoarthritis, Confinement Anxiety Date Acquired: 06/19/2019 N/A N/A Weeks of Treatment: 0 N/A N/A Wound Status: Open N/A N/A Measurements L x W x D 2.3x3x0.1 N/A N/A (cm) Area (cm) : 5.419 N/A N/A Volume (cm) : 0.542 N/A N/A %  Reduction in Area: -100.00% N/A N/A % Reduction in Volume: -100.00% N/A N/A Classification: Grade 2 N/A N/A Exudate Amount: Medium N/A N/A Exudate Type: Serosanguineous N/A N/A Exudate Color: red, brown N/A N/A Wound Margin: Flat and Intact N/A N/A Granulation Amount: Small (1-33%) N/A N/A Granulation Quality: Red N/A N/A Necrotic Amount: Large (67-100%) N/A N/A Necrotic Tissue: Eschar N/A N/A Exposed Structures: N/A N/A Timothy Gibson, Timothy Gibson ([id number]) Fat Layer (Subcutaneous Tissue) Exposed: Yes Fascia: No Tendon: No Muscle: No Joint: No Bone: No Epithelialization: None N/A N/A Treatment Notes Electronic Signature(s) Signed: 07/17/2019 5:01:42 PM By: Army Melia Entered By: Army Melia on 07/17/2019 15:07:41 Timothy Gibson ([id number]) -------------------------------------------------------------------------------- Multi-Disciplinary Care Plan Details Patient Name: Timothy Gibson Date of Service: 07/17/2019 2:30 PM Medical Record Number: [id number] Patient Account Number: [id number] Date of Birth/Sex: 1951/01/01 (69 y.o. M) Treating RN: Army Melia Primary Care Lyrik Buresh: Merrie Roof Other Clinician: Referring Dawnita Molner: Merrie Roof Treating Jakirah Zaun/Extender: Melburn Hake, [name] Weeks in Treatment: 0 Active Inactive Abuse / Safety / Falls / Self Care Management Nursing Diagnoses: Potential for falls Goals: Patient will not experience any injury  related to falls Date Initiated: 07/15/2019 Target Resolution Date: 10/18/2019 Goal Status: Active Interventions: Assess fall risk on admission and as needed Notes: Necrotic Tissue Nursing Diagnoses: Impaired tissue integrity related to necrotic/devitalized tissue Goals: Necrotic/devitalized tissue will be minimized in the wound bed Date Initiated: 07/15/2019 Target Resolution Date: 10/18/2019 Goal Status: Active Interventions: Provide education on necrotic tissue and debridement process Notes: Orientation to the Wound Care Program Nursing Diagnoses: Knowledge deficit related to the wound healing center program Goals: Patient/caregiver will verbalize understanding of the [facility] Date Initiated: 07/15/2019 Target Resolution Date: 10/18/2019 Goal Status: Active Interventions: Provide education on orientation to the wound center Timothy Gibson, Timothy Gibson ([id number]) Notes: Peripheral Neuropathy Nursing Diagnoses: Knowledge deficit related to disease process and management of peripheral neurovascular dysfunction Goals: Patient/caregiver will verbalize understanding of disease process and disease management Date Initiated: 07/15/2019 Target Resolution Date: 10/18/2019 Goal Status: Active Interventions: Assess signs and symptoms of neuropathy upon admission and as needed Provide education on Management of Neuropathy and Related Ulcers Notes: Wound/Skin Impairment Nursing Diagnoses: Impaired tissue integrity Goals: Ulcer/skin breakdown will heal within 14 weeks Date Initiated: 07/15/2019 Target Resolution Date: 10/18/2019 Goal Status: Active Interventions: Assess patient/caregiver ability to obtain necessary supplies Assess patient/caregiver ability to perform ulcer/skin care regimen upon admission and as needed Assess ulceration(s) every visit Notes: Electronic Signature(s) Signed: 07/17/2019 5:01:42 PM By: Army Melia Entered By: Army Melia on 07/17/2019  15:07:32 Timothy Gibson ([id number]) -------------------------------------------------------------------------------- Pain Assessment Details Patient Name: Timothy Gibson Date of Service: 07/17/2019 2:30 PM Medical Record Number: [id number] Patient Account Number: [id number] Date of Birth/Sex: 1950-08-04 (69 y.o. M) Treating RN: Army Melia Primary Care Ludia Gartland: Merrie Roof Other Clinician: Referring Jakerria Kingbird: Merrie Roof Treating TRUE Shackleford/Extender: Melburn Hake, [name] Weeks in Treatment: 0 Active Problems Location of Pain Severity and Description of Pain Patient Has Paino No Site Locations Pain Management and Medication Current Pain Management: Electronic Signature(s) Signed: 07/17/2019 4:13:51 PM By: Paulla Fore, RRT, CHT Signed: 07/17/2019 5:01:42 PM By: Army Melia Entered By: Lorine Bears on 07/17/2019 14:25:12 Timothy Gibson ([id number]) -------------------------------------------------------------------------------- Patient/Caregiver Education Details Patient Name: Timothy Gibson Date of Service: 07/17/2019 2:30 PM Medical Record Number: [id number] Patient Account Number: [id number] Date of Birth/Gender: Jun 06, 1951 (69 y.o. M) Treating RN: Army Melia Primary Care Physician: Merrie Roof Other Clinician: Referring Physician: Merrie Roof Treating Physician/Extender: [name], [name] Weeks in  Treatment: 0 Education Assessment Education Provided To: Patient Education Topics Provided Wound/Skin Impairment: Handouts: Caring for Your Ulcer Methods: Demonstration, Explain/Verbal Responses: State content correctly Electronic Signature(s) Signed: 07/17/2019 5:01:42 PM By: Army Melia Entered By: Army Melia on 07/17/2019 15:14:15 Timothy Gibson ([id number]) -------------------------------------------------------------------------------- Wound Assessment Details Patient Name: Timothy Gibson Date of Service: 07/17/2019 2:30  PM Medical Record Number: [id number] Patient Account Number: [id number] Date of Birth/Sex: Dec 05, 1950 (69 y.o. M) Treating RN: Cornell Barman Primary Care Jasa Dundon: Merrie Roof Other Clinician: Referring Thereasa Iannello: Merrie Roof Treating Daylan Boggess/Extender: Melburn Hake, [name] Weeks in Treatment: 0 Wound Status Wound Number: 3 Primary Diabetic Wound/Ulcer of the Lower Extremity Etiology: Wound Location: Left Toe Great Wound Open Wounding Event: Gradually Appeared Status: Date Acquired: 06/19/2019 Comorbid Lymphedema, Arrhythmia, Hypertension, Type II Weeks Of Treatment: 0 History: Diabetes, End Stage Renal Disease, Clustered Wound: No Rheumatoid Arthritis, Osteoarthritis, Confinement Anxiety Photos Wound Measurements Length: (cm) 2.3 % Reduction in Width: (cm) 3 % Reduction in Depth: (cm) 0.1 Epithelializati Area: (cm) 5.419 Volume: (cm) 0.542 Area: -100% Volume: -100% on: None Wound Description Classification: Grade 2 Foul Odor After Wound Margin: Flat and Intact Slough/Fibrino Exudate Amount: Medium Exudate Type: Serosanguineous Exudate Color: red, brown Cleansing: No Yes Wound Bed Granulation Amount: Small (1-33%) Exposed Structure Granulation Quality: Red Fascia Exposed: No Necrotic Amount: Large (67-100%) Fat Layer (Subcutaneous Tissue) Exposed: Yes Necrotic Quality: Eschar Tendon Exposed: No Muscle Exposed: No Joint Exposed: No Bone Exposed: No Timothy Gibson, Timothy Gibson ([id number]) Treatment Notes Wound #3 (Left Toe Great) Notes iodoflex, foam, conform and coban to secure Electronic Signature(s) Signed: 07/17/2019 5:13:22 PM By: Gretta Cool, BSN, RN, CWS, Kim RN, BSN Entered By: Gretta Cool, BSN, RN, CWS, Kim on 07/17/2019 14:35:54 Timothy Gibson ([id number]) -------------------------------------------------------------------------------- [place] Details Patient Name: Timothy Gibson Date of Service: 07/17/2019 2:30 PM Medical Record Number: [id number] Patient Account Number:  [id number] Date of Birth/Sex: May 08, 1951 (69 y.o. M) Treating RN: Army Melia Primary Care Briggett Tuccillo: Merrie Roof Other Clinician: Referring Naje Rice: Merrie Roof Treating Kasia Trego/Extender: Melburn Hake, [name] Weeks in Treatment: 0 Vital Signs Time Taken: 14:25 Temperature (F): 98.0 Height (in): 76 Pulse (bpm): 97 Weight (lbs): 244 Respiratory Rate (breaths/min): 18 Body Mass Index (BMI): 29.7 Blood Pressure (mmHg): 183/93 Reference Range: 80 - 120 mg / dl Electronic Signature(s) Signed: 07/17/2019 4:13:51 PM By: Lorine Bears RCP, RRT, CHT Entered By: Becky Sax, Amado Nash on 07/17/2019 14:28:25

## 2019-07-17 NOTE — Progress Notes (Addendum)
LOUKAS, ANTONSON ([id number]) Visit Report for 07/15/2019 Chief Complaint Document Details Patient Name: Timothy Gibson, Timothy Gibson Date of Service: 07/15/2019 9:45 AM Medical Record Number: [id number] Patient Account Number: [id number] Date of Birth/Sex: [birth year] (69 y.o. M) Treating RN: Montey Hora Primary Care Provider: Merrie Roof Other Clinician: Referring Provider: Merrie Roof Treating Provider/Extender: Melburn Hake, Graviel Payeur Weeks in Treatment: 0 Information Obtained from: Patient Chief Complaint Left Great Toe Electronic Signature(s) Signed: 07/15/2019 10:01:37 AM By: Worthy Keeler PA-C Entered By: Worthy Keeler on 07/15/2019 10:01:37 Timothy Gibson ([id number]) -------------------------------------------------------------------------------- Debridement Details Patient Name: Timothy Gibson Date of Service: 07/15/2019 9:45 AM Medical Record Number: [id number] Patient Account Number: [id number] Date of Birth/Sex: Mar 11, 1951 (68 y.o. M) Treating RN: Montey Hora Primary Care Provider: Merrie Roof Other Clinician: Referring Provider: Merrie Roof Treating Provider/Extender: Melburn Hake, Tanayah Squitieri Weeks in Treatment: 0 Debridement Performed for Wound #3 Left Toe Great Assessment: Performed By: Physician [name], Maynard David E., PA-C Debridement Type: Chemical/Enzymatic/Mechanical Agent Used: saline and gauze Severity of Tissue Pre Fat layer exposed Debridement: Level of Consciousness (Pre- Awake and Alert procedure): Pre-procedure Verification/Time Yes - 10:25 Out Taken: Start Time: 10:25 Pain Control: Lidocaine 4% Topical Solution Instrument: Other : saline and gauze Bleeding: None End Time: 10:26 Procedural Pain: 0 Post Procedural Pain: 0 Response to Treatment: Procedure was tolerated well Level of Consciousness Awake and Alert (Post-procedure): Post Debridement Measurements of Total Wound Length: (cm) 1.5 Width: (cm) 2.3 Depth: (cm) 0.1 Volume: (cm) 0.271 Character  of Wound/Ulcer Post Debridement: Improved Severity of Tissue Post Debridement: Fat layer exposed Post Procedure Diagnosis Same as Pre-procedure Electronic Signature(s) Signed: 07/15/2019 4:56:10 PM By: Montey Hora Signed: 07/16/2019 6:44:22 PM By: Worthy Keeler PA-C Entered By: Montey Hora on 07/15/2019 10:28:07 Timothy Gibson ([id number]) -------------------------------------------------------------------------------- HPI Details Patient Name: Timothy Gibson Date of Service: 07/15/2019 9:45 AM Medical Record Number: [id number] Patient Account Number: [id number] Date of Birth/Sex: 1950-07-25 (69 y.o. M) Treating RN: Montey Hora Primary Care Provider: Merrie Roof Other Clinician: Referring Provider: Merrie Roof Treating Provider/Extender: Melburn Hake, Ezequias Lard Weeks in Treatment: 0 History of Present Illness HPI Description: 11/27/17 on evaluation today patient presents for initial evaluation concerning an issue that he has been having with his right great toe which began last Thursday 11/22/17. He has a history of diabetes mellitus type to which he has had for greater than 30 years currently he is on insulin. Subsequently he also has a history of hypertension. On physical exam inspection is also appears he may have some peripheral vascular disease with his ABI's being noncompressible bilaterally. He is on dialysis as well and this is on Monday, Wednesday, and Friday. Patient was hospitalized back in January/February 2019 although this was more for stomach/back pain though they never told me exactly what was going on. This is according to the patient. Subsequently the ulcer which is between the third and fourth toe when space of the right foot as well is on the plantar aspect of the fourth toe is due to him having cleaned his toes this morning and he states that his finger which is large split the toe tissue in between causing the wounds that we currently see. With that being said he  states this has happened before I explained I would definitely recommend that he not do this any longer. He can use a small soft washcloth to clean between the toes without causing trauma. Subsequently the right great toe hatchery does show evidence of necrotic tissue present on the surface  of the wound specifically there is callous and dead skin surrounding which is trapping fluid causing problems as far as the wound is concerned. The surface of the wound does show slough although due to his vascular flow I'm not going to sharply debride this today I think I will selectively debride away the necrotic skin as well is callous from around the surrounding so this will not continue to be a moisture issue. Nonetheless the patient has no pain he does have diabetic neuropathy. 12/06/17-He is here in follow-up evaluation for a right great toe ulcer. He is voicing no complaints or concerns. He continues to infrequently/socially smokes cigars with no desire to quit. He has been compliant with offloading, using open toed surgical shoe; has been compliant using Santyl daily. He continues on clindamycin although takes it inconsistently secondary to indigestion. The plain film x-ray performed on 5/23 impression: Soft tissue swelling of the left first toe and is noted with probable irregular lucency involving distal tuft of first distal phalanx concerning for osteomyelitis. MRI may be performed for further evaluation; MRI ordered. The vascular evaluation performed on 5/24 field non-compressible ABIs bilaterally and reduced TBI bilaterally suggesting significant tibial disease. He will be referred to vascular medicine for further evaluation. 12/13/17-He is here in follow-up evaluation for right great toe ulcer. He has appointment next Thursday for the MRI and vascular evaluation. Wound culture obtained last week grew abundant Klebsiella oxytoca (multidrug sensitivity), and abundant enteric coccus faecalis (sensitive  to ampicillin). Ampicillin and Cipro were called in, along with a probiotic. In light of his appointments next Thursday he will follow-up in 2 weeks, we will continue with Santyl 12/27/17-He is here for evaluation for a right great toe ulcer. 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 He did have evaluation by vascular medicine, Dr. Ronalee Belts, on 6/13, at that appointment it was decided for him to undergo angiography with possible intervention but he refused and is still considering. If he chooses to have it done he will contact the office. He has not heard back from either ID offices that he was referred to two weeks ago: [place] and [place]. there is improvement in both appearance and measurement to the ulcer. We will extend antibiotic therapy (amoxicillin 500 every 12, Cipro 500 daily) for additional 2 weeks pending infectious disease consult, he will continue with Santyl daily. He was reminded to continue with probiotic therapy while on oral antibiotics; he denies any GI disturbance. 01/03/18-He is here in follow-up evaluation for right great toe ulcer. He is decided to not undergo any vascular intervention at this time. He was established with [facility] infectious disease (Dr. Linus Salmons) last week initiated on vancomycin and cefazolin with dialysis treatment for 6 weeks. We will continue with current treatment plan with Santyl and offloading and he will follow- up in 2 weeks 01/17/18-He is seen in follow-up evaluation for right great toe ulcer. There is improvement in appearance with less nonviable tissue present. We will continue with same treatment plan he will follow-up next week. He is tolerating IV antibiotics without any complications 03/30/18-ER is seen in follow up evaluation for right great toe ulcer. There continues to be improvement. He is tolerating IV antibiotics with hemodialysis, completion date of 7/31. We  will transition to collagen and and follow-up next week 02/07/18-He is here in follow up for a right great toe ulcer. There is red granulation tissue throughout the wound, improved in appearance. He  completed antibiotics yesterday. He saw podiatry last Thursday for nail trimming, at that appointment he periwound callus was trimmed and a culture was obtained; culture negative. He does not have a follow up appointment with PREVIN, JIAN. ([id number]) infectious disease. We will submit for grafix. 02/14/18-He is here in follow-up evaluation for a right great toe ulcer. There is slow improvement, red granulation tissue throughout. The insurance approval for grafix is pending. We will continue with collagen and he'll follow-up next week 02/21/18-He is seen in follow-up evaluation for right great toe ulcer. There is improvement with red granulation tissue throughout. He was approved for oasis and this was placed today. He will follow up next week 02/28/18-He is seen in follow-up evaluation for right great toe ulcer. The wound is stable, oasis applied and he'll follow-up next week 03/07/18-He is seen in follow-up violation of her right great toe ulcer. The wound is dry, no evidence of drainage. The wound appears healed today. He was painted with Betadine, instructed to paint with Betadine daily, cover with band-aid for the next week and then cover with band-aid for the following week continue with surgical shoe. He was encouraged to contact the clinic with any evidence of drainage, or change in appearance to toe/wound. He will be discharged from wound care services Readmission: 07/15/19 on evaluation today patient presents for initial evaluation our clinic concerning issues that he is having with his left great toe. We previously saw him in 2019 for his right great toe which was actually worse at that time and subsequently was able to be completely healed in the end although it did take several months. With  that being said the patient is currently having an area on his left great toe that occurred initially as result of a blister that came up he's not really sure why or how. Subsequently this was initially treated by his podiatrist though the recommendation there was apparently amputation according to the patient and his daughter who was present with him today. With that being said the patient did not want to proceed with amputation and would like to try to perform wound care and get this to heal without having to go down that road. We were able to do that with his other toe and so he would like to at least give that a try before going forward with amputation. Again I completely understand his concern in this regard. With that being said I did discuss with the patient obviously that it's always a chance that we cannot get this to heal with normal wound care measures but obviously we will give it our best shot. He does actually have an appointment scheduled for later today with the vascular specialist in order to evaluate his blood flow. That was at the recommendation of his podiatrist as well. Obviously if he is having good arterial flow and nonetheless is still experiencing issues with having the wound delay in healing then it may simply be a matter of we need to initiate more aggressive wound to therapy in order to see if we can get this to heal. No fevers, chills, nausea, or vomiting noted at this time. The patient notes that this woman has been present for approximately three weeks. He is not a smoker. His most recent hemoglobin A1c was 6.2 on 06/18/19. Electronic Signature(s) Signed: 07/18/2019 10:34:10 AM By: Worthy Keeler PA-C Previous Signature: 07/16/2019 6:44:22 PM Version By: Worthy Keeler PA-C Entered By: Worthy Keeler on 07/18/2019 10:34:09 Timothy Gibson. (  [id number]) -------------------------------------------------------------------------------- Physical Exam Details Patient  Name: SEQUOIA, MINCEY Date of Service: 07/15/2019 9:45 AM Medical Record Number: [id number] Patient Account Number: [id number] Date of Birth/Sex: 1951/01/28 (69 y.o. M) Treating RN: Montey Hora Primary Care Provider: Merrie Roof Other Clinician: Referring Provider: Merrie Roof Treating Provider/Extender: Melburn Hake, Amiir Heckard Weeks in Treatment: 0 Constitutional patient is hypertensive.. pulse regular and within target range for patient.Marland Kitchen respirations regular, non-labored and within target range for patient.Marland Kitchen temperature within target range for patient.. Well-nourished and well-hydrated in no acute distress. Eyes conjunctiva clear no eyelid edema noted. pupils equal round and reactive to light and accommodation. Ears, Nose, Mouth, and Throat no gross abnormality of ear auricles or external auditory canals. normal hearing noted during conversation. mucus membranes moist. Respiratory normal breathing without difficulty. clear to auscultation bilaterally. Cardiovascular regular rate and rhythm with normal S1, S2. 1+ dorsalis pedis/posterior tibialis pulses. no clubbing, cyanosis, significant edema, <3 sec cap refill. Gastrointestinal (GI) soft, non-tender, non-distended, +BS. no ventral hernia noted. Musculoskeletal normal gait and posture. no significant deformity or arthritic changes, no loss or range of motion, no clubbing. Psychiatric this patient is able to make decisions and demonstrates good insight into disease process. Alert and Oriented x 3. pleasant and cooperative. Notes Upon inspection today patient's wound actually showed signs of some healing in the edges of the wound bed although centrally there was a lot of necrotic tissue this likely gonna require sharp debridement to clear this way. Fortunately there's no signs of active infection at this time which is excellent news. No fevers, chills, nausea, or vomiting noted at this time. With that being said he's having testing  later today to determine his arterial status depending on the testing we will be making decisions on debridement are not as well. Electronic Signature(s) Signed: 07/16/2019 6:44:22 PM By: Worthy Keeler PA-C Entered By: Worthy Keeler on 07/16/2019 02:18:42 BARRINGTON, WORLEY ([id number]) -------------------------------------------------------------------------------- Physician Orders Details Patient Name: Timothy Gibson Date of Service: 07/15/2019 9:45 AM Medical Record Number: [id number] Patient Account Number: [id number] Date of Birth/Sex: June 22, 1951 (68 y.o. M) Treating RN: Montey Hora Primary Care Provider: Merrie Roof Other Clinician: Referring Provider: Merrie Roof Treating Provider/Extender: Melburn Hake, Naksh Radi Weeks in Treatment: 0 Verbal / Phone Orders: No Diagnosis Coding ICD-10 Coding Code Description E11.621 Type 2 diabetes mellitus with foot ulcer L97.522 Non-pressure chronic ulcer of other part of left foot with fat layer exposed I10 Essential (primary) hypertension Wound Cleansing Wound #3 Left Toe Great o Dial antibacterial soap, wash wounds, rinse and pat dry prior to dressing wounds o May Shower, gently pat wound dry prior to applying new dressing. Anesthetic (add to Medication List) Wound #3 Left Toe Great o Topical Lidocaine 4% cream applied to wound bed prior to debridement (In Clinic Only). Primary Wound Dressing Wound #3 Left Toe Great o Iodoflex Secondary Dressing Wound #3 Left Toe Great o Gauze and Kerlix/Conform o Foam Dressing Change Frequency Wound #3 Left Toe Great o Change dressing every other day. Follow-up Appointments Wound #3 Left Toe Great o Return Appointment in 1 week. Off-Loading Wound #3 Left Toe Great o Open toe surgical shoe to: - left foot Radiology o X-ray, foot WOODY, KRONBERG ([id number]) Electronic Signature(s) Signed: 07/15/2019 4:56:10 PM By: Montey Hora Signed: 07/16/2019 6:44:22 PM By: Worthy Keeler PA-C Entered By: Montey Hora on 07/15/2019 10:26:15 Timothy Gibson ([id number]) -------------------------------------------------------------------------------- Problem List Details Patient Name: Timothy Gibson Date of Service: 07/15/2019 9:45 AM Medical Record Number: [id number] Patient  Account Number: [id number] Date of Birth/Sex: [birth year] (68 y.o. M) Treating RN: Montey Hora Primary Care Provider: Merrie Roof Other Clinician: Referring Provider: Merrie Roof Treating Provider/Extender: Melburn Hake, Jordayn Mink Weeks in Treatment: 0 Active Problems ICD-10 Evaluated Encounter Code Description Active Date Today Diagnosis E11.621 Type 2 diabetes mellitus with foot ulcer 07/15/2019 No Yes L97.522 Non-pressure chronic ulcer of other part of left foot with fat 07/15/2019 No Yes layer exposed I10 Essential (primary) hypertension 07/15/2019 No Yes Inactive Problems Resolved Problems Electronic Signature(s) Signed: 07/15/2019 10:01:22 AM By: Worthy Keeler PA-C Entered By: Worthy Keeler on 07/15/2019 10:01:22 Timothy Gibson ([id number]) -------------------------------------------------------------------------------- Progress Note Details Patient Name: Timothy Gibson Date of Service: 07/15/2019 9:45 AM Medical Record Number: [id number] Patient Account Number: [id number] Date of Birth/Sex: 1951/05/12 (68 y.o. M) Treating RN: Montey Hora Primary Care Provider: Merrie Roof Other Clinician: Referring Provider: Merrie Roof Treating Provider/Extender: Melburn Hake, Lianette Broussard Weeks in Treatment: 0 Subjective Chief Complaint Information obtained from Patient Left Great Toe History of Present Illness (HPI) 11/27/17 on evaluation today patient presents for initial evaluation concerning an issue that he has been having with his right great toe which began last Thursday 11/22/17. He has a history of diabetes mellitus type to which he has had for greater than 30 years currently he is on  insulin. Subsequently he also has a history of hypertension. On physical exam inspection is also appears he may have some peripheral vascular disease with his ABI's being noncompressible bilaterally. He is on dialysis as well and this is on Monday, Wednesday, and Friday. Patient was hospitalized back in January/February 2019 although this was more for stomach/back pain though they never told me exactly what was going on. This is according to the patient. Subsequently the ulcer which is between the third and fourth toe when space of the right foot as well is on the plantar aspect of the fourth toe is due to him having cleaned his toes this morning and he states that his finger which is large split the toe tissue in between causing the wounds that we currently see. With that being said he states this has happened before I explained I would definitely recommend that he not do this any longer. He can use a small soft washcloth to clean between the toes without causing trauma. Subsequently the right great toe hatchery does show evidence of necrotic tissue present on the surface of the wound specifically there is callous and dead skin surrounding which is trapping fluid causing problems as far as the wound is concerned. The surface of the wound does show slough although due to his vascular flow I'm not going to sharply debride this today I think I will selectively debride away the necrotic skin as well is callous from around the surrounding so this will not continue to be a moisture issue. Nonetheless the patient has no pain he does have diabetic neuropathy. 12/06/17-He is here in follow-up evaluation for a right great toe ulcer. He is voicing no complaints or concerns. He continues to infrequently/socially smokes cigars with no desire to quit. He has been compliant with offloading, using open toed surgical shoe; has been compliant using Santyl daily. He continues on clindamycin although takes it  inconsistently secondary to indigestion. The plain film x-ray performed on 5/23 impression: Soft tissue swelling of the left first toe and is noted with probable irregular lucency involving distal tuft of first distal phalanx concerning for osteomyelitis. MRI may be performed for further evaluation; MRI ordered. The  vascular evaluation performed on 5/24 field non-compressible ABIs bilaterally and reduced TBI bilaterally suggesting significant tibial disease. He will be referred to vascular medicine for further evaluation. 12/13/17-He is here in follow-up evaluation for right great toe ulcer. He has appointment next Thursday for the MRI and vascular evaluation. Wound culture obtained last week grew abundant Klebsiella oxytoca (multidrug sensitivity), and abundant enteric coccus faecalis (sensitive to ampicillin). Ampicillin and Cipro were called in, along with a probiotic. In light of his appointments next Thursday he will follow-up in 2 weeks, we will continue with Santyl 12/27/17-He is here for evaluation for a right great toe ulcer. 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 He did have evaluation by vascular medicine, Dr. Ronalee Belts, on 6/13, at that appointment it was decided for him to undergo angiography with possible intervention but he refused and is still considering. If he chooses to have it done he will contact the office. He has not heard back from either ID offices that he was referred to two weeks ago: [place] and [place]. there is improvement in both appearance and measurement to the ulcer. We will extend antibiotic therapy (amoxicillin 500 every 12, Cipro 500 daily) for additional 2 weeks pending infectious disease consult, he will continue with Santyl daily. He was reminded to continue with probiotic therapy while on oral antibiotics; he denies any GI disturbance. 01/03/18-He is here in follow-up evaluation for  right great toe ulcer. He is decided to not undergo any vascular intervention at this time. He was established with [facility] infectious disease (Dr. Linus Salmons) last week initiated on vancomycin and cefazolin with dialysis treatment for 6 weeks. We will continue with current treatment plan with Santyl and offloading and he will follow- up in 2 weeks 01/17/18-He is seen in follow-up evaluation for right great toe ulcer. There is improvement in appearance with less nonviable tissue present. We will continue with same treatment plan he will follow-up next week. He is tolerating IV antibiotics without AVRUM, KIMBALL. ([id number]) any complications 7/67/20-NO is seen in follow up evaluation for right great toe ulcer. There continues to be improvement. He is tolerating IV antibiotics with hemodialysis, completion date of 7/31. We will transition to collagen and and follow-up next week 02/07/18-He is here in follow up for a right great toe ulcer. There is red granulation tissue throughout the wound, improved in appearance. He completed antibiotics yesterday. He saw podiatry last Thursday for nail trimming, at that appointment he periwound callus was trimmed and a culture was obtained; culture negative. He does not have a follow up appointment with infectious disease. We will submit for grafix. 02/14/18-He is here in follow-up evaluation for a right great toe ulcer. There is slow improvement, red granulation tissue throughout. The insurance approval for grafix is pending. We will continue with collagen and he'll follow-up next week 02/21/18-He is seen in follow-up evaluation for right great toe ulcer. There is improvement with red granulation tissue throughout. He was approved for oasis and this was placed today. He will follow up next week 02/28/18-He is seen in follow-up evaluation for right great toe ulcer. The wound is stable, oasis applied and he'll follow-up next week 03/07/18-He is seen in follow-up  violation of her right great toe ulcer. The wound is dry, no evidence of drainage. The wound appears healed today. He was painted with Betadine, instructed to paint with Betadine daily, cover with band-aid for the next week and then cover  with band-aid for the following week continue with surgical shoe. He was encouraged to contact the clinic with any evidence of drainage, or change in appearance to toe/wound. He will be discharged from wound care services Readmission: 07/15/19 on evaluation today patient presents for initial evaluation our clinic concerning issues that he is having with his left great toe. We previously saw him in 2019 for his right great toe which was actually worse at that time and subsequently was able to be completely healed in the end although it did take several months. With that being said the patient is currently having an area on his left great toe that occurred initially as result of a blister that came up he's not really sure why or how. Subsequently this was initially treated by his podiatrist though the recommendation there was apparently amputation according to the patient and his daughter who was present with him today. With that being said the patient did not want to proceed with amputation and would like to try to perform wound care and get this to heal without having to go down that road. We were able to do that with his other toe and so he would like to at least give that a try before going forward with amputation. Again I completely understand his concern in this regard. With that being said I did discuss with the patient obviously that it's always a chance that we cannot get this to heal with normal wound care measures but obviously we will give it our best shot. He does actually have an appointment scheduled for later today with the vascular specialist in order to evaluate his blood flow. That was at the recommendation of his podiatrist as well. Obviously if he  is having good arterial flow and nonetheless is still experiencing issues with having the wound delay in healing then it may simply be a matter of we need to initiate more aggressive wound to therapy in order to see if we can get this to heal. No fevers, chills, nausea, or vomiting noted at this time. The patient notes that this woman has been present for approximately three weeks. He is not a smoker. His most recent hemoglobin A1c was 6.2 on 06/18/19. Patient History Information obtained from Patient. Allergies No Known Drug Allergies Family History Cancer - Mother,Father, Diabetes - Mother, Heart Disease - Mother, Hypertension - Mother,Father, Stroke - Father, Thyroid Problems - Mother, No family history of Kidney Disease, Lung Disease, Seizures, Tuberculosis. Social History Former smoker - cigar about 4 to 5 days, Marital Status - Divorced, Alcohol Use - Never, Drug Use - No History, Caffeine Use - Daily. Medical History Eyes Denies history of Cataracts, Glaucoma, Optic Neuritis Ear/Nose/Mouth/Throat Denies history of Chronic sinus problems/congestion, Middle ear problems Hematologic/Lymphatic Timothy Gibson, Timothy Gibson ([id number]) Patient has history of Lymphedema Denies history of Anemia, Hemophilia, Human Immunodeficiency Virus, Sickle Cell Disease Respiratory Denies history of Aspiration, Asthma, Chronic Obstructive Pulmonary Disease (COPD), Pneumothorax, Sleep Apnea, Tuberculosis Cardiovascular Patient has history of Arrhythmia, Hypertension Denies history of Congestive Heart Failure, Coronary Artery Disease, Deep Vein Thrombosis, Hypotension, Myocardial Infarction, Peripheral Arterial Disease, Peripheral Venous Disease, Phlebitis, Vasculitis Gastrointestinal Denies history of Cirrhosis , Colitis, Crohn s, Hepatitis A, Hepatitis B, Hepatitis C Endocrine Patient has history of Type II Diabetes Genitourinary Patient has history of End Stage Renal Disease Immunological Denies  history of Lupus Erythematosus, Raynaud s Musculoskeletal Patient has history of Rheumatoid Arthritis, Osteoarthritis Denies history of Gout, Osteomyelitis Neurologic Denies history of Dementia, Neuropathy, Quadriplegia, Paraplegia,  Seizure Disorder Oncologic Denies history of Received Chemotherapy, Received Radiation Psychiatric Patient has history of Confinement Anxiety Denies history of Anorexia/bulimia Hospitalization/Surgery History - stomach and back pain. Medical And Surgical History Notes Genitourinary Kidney cancer, only one kidney Review of Systems (ROS) Constitutional Symptoms (General Health) Denies complaints or symptoms of Fatigue, Fever, Chills, Marked Weight Change. Eyes Complains or has symptoms of Glasses / Contacts - glasses. Denies complaints or symptoms of Dry Eyes, Vision Changes. Ear/Nose/Mouth/Throat Denies complaints or symptoms of Difficult clearing ears, Sinusitis. Hematologic/Lymphatic Denies complaints or symptoms of Bleeding / Clotting Disorders, Human Immunodeficiency Virus. Respiratory Denies complaints or symptoms of Chronic or frequent coughs, Shortness of Breath. Gastrointestinal Denies complaints or symptoms of Frequent diarrhea, Nausea, Vomiting. Endocrine Denies complaints or symptoms of Hepatitis, Thyroid disease, Polydypsia (Excessive Thirst). Genitourinary Denies complaints or symptoms of Kidney failure/ Dialysis, Incontinence/dribbling. Immunological Denies complaints or symptoms of Hives, Itching. Integumentary (Skin) Denies complaints or symptoms of Wounds, Bleeding or bruising tendency, Breakdown, Swelling. Musculoskeletal Denies complaints or symptoms of Muscle Pain, Muscle Weakness. Neurologic Denies complaints or symptoms of Numbness/parasthesias, Focal/Weakness. ALDRIN, ENGELHARD ([id number]) Oncologic Kidney cancer Psychiatric Denies complaints or symptoms of Anxiety, Claustrophobia. Objective Constitutional patient is  hypertensive.. pulse regular and within target range for patient.Marland Kitchen respirations regular, non-labored and within target range for patient.Marland Kitchen temperature within target range for patient.. Well-nourished and well-hydrated in no acute distress. Vitals Time Taken: 9:40 AM, Height: 76 in, Source: Stated, Weight: 244 lbs, Source: Measured, BMI: 29.7, Temperature: 98.6 F, Pulse: 79 bpm, Respiratory Rate: 16 breaths/min, Blood Pressure: 175/74 mmHg. Eyes conjunctiva clear no eyelid edema noted. pupils equal round and reactive to light and accommodation. Ears, Nose, Mouth, and Throat no gross abnormality of ear auricles or external auditory canals. normal hearing noted during conversation. mucus membranes moist. Respiratory normal breathing without difficulty. clear to auscultation bilaterally. Cardiovascular regular rate and rhythm with normal S1, S2. 1+ dorsalis pedis/posterior tibialis pulses. no clubbing, cyanosis, significant edema, Gastrointestinal (GI) soft, non-tender, non-distended, +BS. no ventral hernia noted. Musculoskeletal normal gait and posture. no significant deformity or arthritic changes, no loss or range of motion, no clubbing. Psychiatric this patient is able to make decisions and demonstrates good insight into disease process. Alert and Oriented x 3. pleasant and cooperative. General Notes: Upon inspection today patient's wound actually showed signs of some healing in the edges of the wound bed although centrally there was a lot of necrotic tissue this likely gonna require sharp debridement to clear this way. Fortunately there's no signs of active infection at this time which is excellent news. No fevers, chills, nausea, or vomiting noted at this time. With that being said he's having testing later today to determine his arterial status depending on the testing we will be making decisions on debridement are not as well. Integumentary (Hair, Skin) Wound #3 status is Open.  Original cause of wound was Gradually Appeared. The wound is located on the Left Toe Great. The wound measures 1.5cm length x 2.3cm width x 0.1cm depth; 2.71cm^2 area and 0.271cm^3 volume. There is Fat Layer (Subcutaneous Tissue) Exposed exposed. There is no tunneling or undermining noted. There is a medium amount of serosanguineous drainage noted. There is small (1-33%) red granulation within the wound bed. There is a large (67-100%) FISHEL, WAMBLE. ([id number]) amount of necrotic tissue within the wound bed including Eschar and Adherent Slough. Assessment Active Problems ICD-10 Type 2 diabetes mellitus with foot ulcer Non-pressure chronic ulcer of other part of left foot with fat layer exposed  Essential (primary) hypertension Procedures Wound #3 Pre-procedure diagnosis of Wound #3 is a Diabetic Wound/Ulcer of the Lower Extremity located on the Left Toe Great .Severity of Tissue Pre Debridement is: Fat layer exposed. There was a Chemical/Enzymatic/Mechanical debridement performed by [name], Helyn Schwan E., PA-C. With the following instrument(s): saline and gauze after achieving pain control using Lidocaine 4% Topical Solution. Other agent used was saline and gauze. A time out was conducted at 10:25, prior to the start of the procedure. There was no bleeding. The procedure was tolerated well with a pain level of 0 throughout and a pain level of 0 following the procedure. Post Debridement Measurements: 1.5cm length x 2.3cm width x 0.1cm depth; 0.271cm^3 volume. Character of Wound/Ulcer Post Debridement is improved. Severity of Tissue Post Debridement is: Fat layer exposed. Post procedure Diagnosis Wound #3: Same as Pre-Procedure Plan Wound Cleansing: Wound #3 Left Toe Great: Dial antibacterial soap, wash wounds, rinse and pat dry prior to dressing wounds May Shower, gently pat wound dry prior to applying new dressing. Anesthetic (add to Medication List): Wound #3 Left Toe Great: Topical  Lidocaine 4% cream applied to wound bed prior to debridement (In Clinic Only). Primary Wound Dressing: Wound #3 Left Toe Great: Iodoflex Secondary Dressing: Wound #3 Left Toe Great: Gauze and Kerlix/Conform Foam Dressing Change Frequency: Wound #3 Left Toe Great: Change dressing every other day. Follow-up Appointments: Wound #3 Left Toe GreatHUSAYN, REIM ([id number]) Return Appointment in 1 week. Off-Loading: Wound #3 Left Toe Great: Open toe surgical shoe to: - left foot Radiology ordered were: X-ray, foot At this point my suggestion is gonna be that we go ahead and initiate treatment with a Iodoflex dressing. Subsequently we can actually be seen in back in just two days time in order to evaluate and see whether or not we should go ahead and go forward with sharp debridement. Previously the patient has done very well in regard to healing the toe on his right foot at this point we're gonna work on the left foot as well. With regard to cover dressings I would recommend foam, galls, and Kerlex to you pad the area. Also recommend the patient where he opens his surgical shoe which he does have already I think that's definitely good to wear. I'm gonna recommend an x-ray of the foot to evaluate for any signs of active infection in the bone which could be a bigger issue for [place] at this point. He's in agreement that plan. To his knowledge she doesn't think an x-ray was done at the podiatry office when he was there. Please see above for specific wound care orders. We will see patient for re-evaluation in 1 week(s) here in the clinic. If anything worsens or changes patient will contact our office for additional recommendations. Electronic Signature(s) Signed: 07/18/2019 10:34:22 AM By: Worthy Keeler PA-C Previous Signature: 07/16/2019 6:44:22 PM Version By: Worthy Keeler PA-C Entered By: Worthy Keeler on 07/18/2019 10:34:22 ARJUNA, DOEDEN  ([id number]) -------------------------------------------------------------------------------- ROS/PFSH Details Patient Name: Timothy Gibson Date of Service: 07/15/2019 9:45 AM Medical Record Number: [id number] Patient Account Number: [id number] Date of Birth/Sex: 03/26/1951 (68 y.o. M) Treating RN: Army Melia Primary Care Provider: Merrie Roof Other Clinician: Referring Provider: Merrie Roof Treating Provider/Extender: Melburn Hake, Lille Karim Weeks in Treatment: 0 Information Obtained From Patient Constitutional Symptoms (General Health) Complaints and Symptoms: Negative for: Fatigue; Fever; Chills; Marked Weight Change Eyes Complaints and Symptoms: Positive for: Glasses / Contacts - glasses Negative for: Dry Eyes; Vision Changes  Medical History: Negative for: Cataracts; Glaucoma; Optic Neuritis Ear/Nose/Mouth/Throat Complaints and Symptoms: Negative for: Difficult clearing ears; Sinusitis Medical History: Negative for: Chronic sinus problems/congestion; Middle ear problems Hematologic/Lymphatic Complaints and Symptoms: Negative for: Bleeding / Clotting Disorders; Human Immunodeficiency Virus Medical History: Positive for: Lymphedema Negative for: Anemia; Hemophilia; Human Immunodeficiency Virus; Sickle Cell Disease Respiratory Complaints and Symptoms: Negative for: Chronic or frequent coughs; Shortness of Breath Medical History: Negative for: Aspiration; Asthma; Chronic Obstructive Pulmonary Disease (COPD); Pneumothorax; Sleep Apnea; Tuberculosis Gastrointestinal Complaints and Symptoms: Negative for: Frequent diarrhea; Nausea; Vomiting Medical History: Negative for: Cirrhosis ; Colitis; Crohnos; Hepatitis A; Hepatitis B; Hepatitis C Timothy Gibson, Timothy Gibson. ([id number]) Endocrine Complaints and Symptoms: Negative for: Hepatitis; Thyroid disease; Polydypsia (Excessive Thirst) Medical History: Positive for: Type II Diabetes Time with diabetes: 30 years Treated with: Diet Blood  sugar tested every day: No Genitourinary Complaints and Symptoms: Negative for: Kidney failure/ Dialysis; Incontinence/dribbling Medical History: Positive for: End Stage Renal Disease Past Medical History Notes: Kidney cancer, only one kidney Immunological Complaints and Symptoms: Negative for: Hives; Itching Medical History: Negative for: Lupus Erythematosus; Raynaudos Integumentary (Skin) Complaints and Symptoms: Negative for: Wounds; Bleeding or bruising tendency; Breakdown; Swelling Musculoskeletal Complaints and Symptoms: Negative for: Muscle Pain; Muscle Weakness Medical History: Positive for: Rheumatoid Arthritis; Osteoarthritis Negative for: Gout; Osteomyelitis Neurologic Complaints and Symptoms: Negative for: Numbness/parasthesias; Focal/Weakness Medical History: Negative for: Dementia; Neuropathy; Quadriplegia; Paraplegia; Seizure Disorder Psychiatric Complaints and Symptoms: Negative for: Anxiety; Claustrophobia Medical History: Positive for: Confinement Anxiety Timothy Gibson, Timothy Gibson ([id number]) Negative for: Anorexia/bulimia Cardiovascular Medical History: Positive for: Arrhythmia; Hypertension Negative for: Congestive Heart Failure; Coronary Artery Disease; Deep Vein Thrombosis; Hypotension; Myocardial Infarction; Peripheral Arterial Disease; Peripheral Venous Disease; Phlebitis; Vasculitis Oncologic Complaints and Symptoms: Review of System Notes: Kidney cancer Medical History: Negative for: Received Chemotherapy; Received Radiation Immunizations Pneumococcal Vaccine: Received Pneumococcal Vaccination: Yes Implantable Devices No devices added Hospitalization / Surgery History Type of Hospitalization/Surgery stomach and back pain Family and Social History Cancer: Yes - Mother,Father; Diabetes: Yes - Mother; Heart Disease: Yes - Mother; Hypertension: Yes - Mother,Father; Kidney Disease: No; Lung Disease: No; Seizures: No; Stroke: Yes - Father; Thyroid  Problems: Yes - Mother; Tuberculosis: No; Former smoker - cigar about 4 to 5 days; Marital Status - Divorced; Alcohol Use: Never; Drug Use: No History; Caffeine Use: Daily; Financial Concerns: No; Food, Clothing or Shelter Needs: No; Support System Lacking: No; Transportation Concerns: No Electronic Signature(s) Signed: 07/15/2019 4:03:57 PM By: Army Melia Signed: 07/16/2019 6:44:22 PM By: Worthy Keeler PA-C Entered By: Army Melia on 07/15/2019 09:47:01 SHAWNEE, HIGHAM ([id number]) -------------------------------------------------------------------------------- SuperBill Details Patient Name: Timothy Gibson Date of Service: 07/15/2019 Medical Record Number: [id number] Patient Account Number: [id number] Date of Birth/Sex: [birth year] (69 y.o. M) Treating RN: Montey Hora Primary Care Provider: Merrie Roof Other Clinician: Referring Provider: Merrie Roof Treating Provider/Extender: Melburn Hake, Shermon Bozzi Weeks in Treatment: 0 Diagnosis Coding ICD-10 Codes Code Description E11.621 Type 2 diabetes mellitus with foot ulcer L97.522 Non-pressure chronic ulcer of other part of left foot with fat layer exposed I10 Essential (primary) hypertension Facility Procedures CPT4 Code: 48546270 Description: 99213 - WOUND CARE VISIT-LEV 3 EST PT Modifier: Quantity: 1 CPT4 Code: 35009381 Description: 82993 - DEBRIDE W/O ANES NON SELECT Modifier: Quantity: 1 Physician Procedures CPT4 Code: 7169678 Description: 93810 - WC PHYS LEVEL 4 - EST PT ICD-10 Diagnosis Description E11.621 Type 2 diabetes mellitus with foot ulcer L97.522 Non-pressure chronic ulcer of other part of left foot with fat I10 Essential (primary) hypertension Modifier: layer exposed  Quantity: 1 Electronic Signature(s) Signed: 07/16/2019 6:44:22 PM By: Worthy Keeler PA-C Previous Signature: 07/15/2019 4:56:10 PM Version By: Montey Hora Entered By: Worthy Keeler on 07/16/2019 02:21:02

## 2019-07-17 NOTE — Progress Notes (Addendum)
VINCE, AINSLEY ([id number]) Visit Report for 07/17/2019 Chief Complaint Document Details Patient Name: Timothy Gibson, Timothy Gibson Date of Service: 07/17/2019 2:30 PM Medical Record Number: [id number] Patient Account Number: [id number] Date of Birth/Sex: 06-Dec-1950 (69 y.o. M) Treating RN: Army Melia Primary Care Provider: Merrie Roof Other Clinician: Referring Provider: Merrie Roof Treating Provider/Extender: Melburn Hake, Benett Swoyer Weeks in Treatment: 0 Information Obtained from: Patient Chief Complaint Left Great Toe Electronic Signature(s) Signed: 07/17/2019 2:53:36 PM By: Worthy Keeler PA-C Entered By: Worthy Keeler on 07/17/2019 14:53:36 Timothy Gibson ([id number]) -------------------------------------------------------------------------------- HPI Details Patient Name: Timothy Gibson Date of Service: 07/17/2019 2:30 PM Medical Record Number: [id number] Patient Account Number: [id number] Date of Birth/Sex: [birth year] (68 y.o. M) Treating RN: Army Melia Primary Care Provider: Merrie Roof Other Clinician: Referring Provider: Merrie Roof Treating Provider/Extender: Melburn Hake, Derriana Oser Weeks in Treatment: 0 History of Present Illness HPI Description: 11/27/17 on evaluation today patient presents for initial evaluation concerning an issue that he has been having with his right great toe which began last Thursday 11/22/17. He has a history of diabetes mellitus type to which he has had for greater than 30 years currently he is on insulin. Subsequently he also has a history of hypertension. On physical exam inspection is also appears he may have some peripheral vascular disease with his ABI's being noncompressible bilaterally. He is on dialysis as well and this is on Monday, Wednesday, and Friday. Patient was hospitalized back in January/February 2019 although this was more for stomach/back pain though they never told me exactly what was going on. This is according to the patient. Subsequently  the ulcer which is between the third and fourth toe when space of the right foot as well is on the plantar aspect of the fourth toe is due to him having cleaned his toes this morning and he states that his finger which is large split the toe tissue in between causing the wounds that we currently see. With that being said he states this has happened before I explained I would definitely recommend that he not do this any longer. He can use a small soft washcloth to clean between the toes without causing trauma. Subsequently the right great toe hatchery does show evidence of necrotic tissue present on the surface of the wound specifically there is callous and dead skin surrounding which is trapping fluid causing problems as far as the wound is concerned. The surface of the wound does show slough although due to his vascular flow I'm not going to sharply debride this today I think I will selectively debride away the necrotic skin as well is callous from around the surrounding so this will not continue to be a moisture issue. Nonetheless the patient has no pain he does have diabetic neuropathy. 12/06/17-He is here in follow-up evaluation for a right great toe ulcer. He is voicing no complaints or concerns. He continues to infrequently/socially smokes cigars with no desire to quit. He has been compliant with offloading, using open toed surgical shoe; has been compliant using Santyl daily. He continues on clindamycin although takes it inconsistently secondary to indigestion. The plain film x-ray performed on 5/23 impression: Soft tissue swelling of the left first toe and is noted with probable irregular lucency involving distal tuft of first distal phalanx concerning for osteomyelitis. MRI may be performed for further evaluation; MRI ordered. The vascular evaluation performed on 5/24 field non-compressible ABIs bilaterally and reduced TBI bilaterally suggesting significant tibial disease. He will be referred  to vascular  medicine for further evaluation. 12/13/17-He is here in follow-up evaluation for right great toe ulcer. He has appointment next Thursday for the MRI and vascular evaluation. Wound culture obtained last week grew abundant Klebsiella oxytoca (multidrug sensitivity), and abundant enteric coccus faecalis (sensitive to ampicillin). Ampicillin and Cipro were called in, along with a probiotic. In light of his appointments next Thursday he will follow-up in 2 weeks, we will continue with Santyl 12/27/17-He is here for evaluation for a right great toe ulcer. 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 He did have evaluation by vascular medicine, Dr. Ronalee Belts, on 6/13, at that appointment it was decided for him to undergo angiography with possible intervention but he refused and is still considering. If he chooses to have it done he will contact the office. He has not heard back from either ID offices that he was referred to two weeks ago: [place] and [place]. there is improvement in both appearance and measurement to the ulcer. We will extend antibiotic therapy (amoxicillin 500 every 12, Cipro 500 daily) for additional 2 weeks pending infectious disease consult, he will continue with Santyl daily. He was reminded to continue with probiotic therapy while on oral antibiotics; he denies any GI disturbance. 01/03/18-He is here in follow-up evaluation for right great toe ulcer. He is decided to not undergo any vascular intervention at this time. He was established with [facility] infectious disease (Dr. Linus Salmons) last week initiated on vancomycin and cefazolin with dialysis treatment for 6 weeks. We will continue with current treatment plan with Santyl and offloading and he will follow- up in 2 weeks 01/17/18-He is seen in follow-up evaluation for right great toe ulcer. There is improvement in appearance with less nonviable tissue  present. We will continue with same treatment plan he will follow-up next week. He is tolerating IV antibiotics without any complications 9/50/93-OI is seen in follow up evaluation for right great toe ulcer. There continues to be improvement. He is tolerating IV antibiotics with hemodialysis, completion date of 7/31. We will transition to collagen and and follow-up next week 02/07/18-He is here in follow up for a right great toe ulcer. There is red granulation tissue throughout the wound, improved in appearance. He completed antibiotics yesterday. He saw podiatry last Thursday for nail trimming, at that appointment he periwound callus was trimmed and a culture was obtained; culture negative. He does not have a follow up appointment with [facility]. ([id number]) infectious disease. We will submit for grafix. 02/14/18-He is here in follow-up evaluation for a right great toe ulcer. There is slow improvement, red granulation tissue throughout. The insurance approval for grafix is pending. We will continue with collagen and he'll follow-up next week 02/21/18-He is seen in follow-up evaluation for right great toe ulcer. There is improvement with red granulation tissue throughout. He was approved for oasis and this was placed today. He will follow up next week 02/28/18-He is seen in follow-up evaluation for right great toe ulcer. The wound is stable, oasis applied and he'll follow-up next week 03/07/18-He is seen in follow-up violation of her right great toe ulcer. The wound is dry, no evidence of drainage. The wound appears healed today. He was painted with Betadine, instructed to paint with Betadine daily, cover with band-aid for the next week and then cover with band-aid for the following week continue with surgical shoe. He was encouraged to contact the clinic with any evidence of drainage, or change  in appearance to toe/wound. He will be discharged from wound care services Readmission: 07/14/18 on  evaluation today patient presents for initial evaluation our clinic concerning issues that he is having with his left great toe. We previously saw him in 2019 for his right great toe which was actually worse at that time and subsequently was able to be completely healed in the end although it did take several months. With that being said the patient is currently having an area on his left great toe that occurred initially as result of a blister that came up he's not really sure why or how. Subsequently this was initially treated by his podiatrist though the recommendation there was apparently amputation according to the patient and his daughter who was present with him today. With that being said the patient did not want to proceed with amputation and would like to try to perform wound care and get this to heal without having to go down that road. We were able to do that with his other toe and so he would like to at least give that a try before going forward with amputation. Again I completely understand his concern in this regard. With that being said I did discuss with the patient obviously that it's always a chance that we cannot get this to heal with normal wound care measures but obviously we will give it our best shot. He does actually have an appointment scheduled for later today with the vascular specialist in order to evaluate his blood flow. That was at the recommendation of his podiatrist as well. Obviously if he is having good arterial flow and nonetheless is still experiencing issues with having the wound delay in healing then it may simply be a matter of we need to initiate more aggressive wound to therapy in order to see if we can get this to heal. No fevers, chills, nausea, or vomiting noted at this time. The patient notes that this woman has been present for approximately three weeks. He is not a smoker. His most recent hemoglobin A1c was 6.2 on 06/18/19. 07/17/2019 on evaluation today  patient appears unfortunately to not be doing as well. He did see vascular I did review the note as well. That was on 07/15/2019. Subsequently there can actually be taking him to the OR for an angiogram due to poor arterial flow into this extremity. Apparently the atherosclerotic changes were severe. The patient is stated to be at risk for limb loss. Nonetheless the good news is this is being scheduled fairly quickly he will have the surgery/procedure next Thursday. With that being said I am going also go ahead based on the results of his x-ray which showed some chance of osteomyelitis in the distal portion of the toe get the patient set up for an MRI to further evaluate for the possibility of osteomyelitis obviously if he does have osteomyelitis we need to know so that we can address this appropriately. Electronic Signature(s) Signed: 07/18/2019 9:21:09 AM By: Worthy Keeler PA-C Entered By: Worthy Keeler on 07/18/2019 09:21:08 Timothy Gibson, Timothy Gibson ([id number]) -------------------------------------------------------------------------------- Physical Exam Details Patient Name: Timothy Gibson Date of Service: 07/17/2019 2:30 PM Medical Record Number: [id number] Patient Account Number: [id number] Date of Birth/Sex: [birth year] (69 y.o. M) Treating RN: Army Melia Primary Care Provider: Merrie Roof Other Clinician: Referring Provider: Merrie Roof Treating Provider/Extender: [name], Pearl Bents Weeks in Treatment: 0 Constitutional Well-nourished and well-hydrated in no acute distress. Respiratory normal breathing without difficulty. Psychiatric this patient is able  to make decisions and demonstrates good insight into disease process. Alert and Oriented x 3. pleasant and cooperative. Notes Patient's wound currently did appear to be somewhat dry but I am actually okay with that obviously if there is necrotic tissue/gangrene on the end of the toe it is better for her to be dry and then to be wet.  With that being said I do believe at some point this may need to be debrided away but right now based on the severe arterial disease I am not going to perform any debridement today. The patient understands and is in agreement at this point. His daughter again was present during the evaluation today. Electronic Signature(s) Signed: 07/18/2019 9:21:49 AM By: Worthy Keeler PA-C Entered By: Worthy Keeler on 07/18/2019 09:21:48 Timothy Gibson, Timothy Gibson ([id number]) -------------------------------------------------------------------------------- Physician Orders Details Patient Name: Timothy Gibson Date of Service: 07/17/2019 2:30 PM Medical Record Number: [id number] Patient Account Number: [id number] Date of Birth/Sex: Oct 26, 1950 (68 y.o. M) Treating RN: Army Melia Primary Care Provider: Merrie Roof Other Clinician: Referring Provider: Merrie Roof Treating Provider/Extender: Melburn Hake, Reid Regas Weeks in Treatment: 0 Verbal / Phone Orders: No Diagnosis Coding ICD-10 Coding Code Description E11.621 Type 2 diabetes mellitus with foot ulcer L97.522 Non-pressure chronic ulcer of other part of left foot with fat layer exposed I10 Essential (primary) hypertension Wound Cleansing Wound #3 Left Toe Great o Dial antibacterial soap, wash wounds, rinse and pat dry prior to dressing wounds o May Shower, gently pat wound dry prior to applying new dressing. Anesthetic (add to Medication List) Wound #3 Left Toe Great o Topical Lidocaine 4% cream applied to wound bed prior to debridement (In Clinic Only). Primary Wound Dressing Wound #3 Left Toe Great o Iodoflex Secondary Dressing Wound #3 Left Toe Great o Gauze and Kerlix/Conform o Foam Dressing Change Frequency Wound #3 Left Toe Great o Change dressing every other day. Follow-up Appointments Wound #3 Left Toe Great o Return Appointment in 2 weeks. Off-Loading Wound #3 Left Toe Great o Open toe surgical shoe to: - left  foot Radiology o MRI, lower extremity without contrast - left foot Patient Medications Timothy Gibson, Timothy Gibson ([id number]) Allergies: No Known Drug Allergies Notifications Medication Indication Start End doxycycline hyclate 07/17/2019 DOSE 1 - oral 100 mg capsule - 1 capsule oral taken 2 times a day for 14 days Electronic Signature(s) Signed: 07/17/2019 3:19:35 PM By: Worthy Keeler PA-C Entered By: Worthy Keeler on 07/17/2019 15:19:35 Timothy Gibson ([id number]) -------------------------------------------------------------------------------- Problem List Details Patient Name: Timothy Gibson Date of Service: 07/17/2019 2:30 PM Medical Record Number: [id number] Patient Account Number: [id number] Date of Birth/Sex: 09-25-1950 (68 y.o. M) Treating RN: Army Melia Primary Care Provider: Merrie Roof Other Clinician: Referring Provider: Merrie Roof Treating Provider/Extender: Melburn Hake, Aldean Suddeth Weeks in Treatment: 0 Active Problems ICD-10 Evaluated Encounter Code Description Active Date Today Diagnosis E11.621 Type 2 diabetes mellitus with foot ulcer 07/15/2019 No Yes L97.522 Non-pressure chronic ulcer of other part of left foot with fat 07/15/2019 No Yes layer exposed I10 Essential (primary) hypertension 07/15/2019 No Yes Inactive Problems Resolved Problems Electronic Signature(s) Signed: 07/17/2019 2:53:31 PM By: Worthy Keeler PA-C Entered By: Worthy Keeler on 07/17/2019 14:53:31 Timothy Gibson ([id number]) -------------------------------------------------------------------------------- Progress Note Details Patient Name: Timothy Gibson Date of Service: 07/17/2019 2:30 PM Medical Record Number: [id number] Patient Account Number: [id number] Date of Birth/Sex: [birth year] (68 y.o. M) Treating RN: Army Melia Primary Care Provider: Merrie Roof Other Clinician: Referring Provider: Merrie Roof Treating Provider/Extender: Melburn Hake,  Jackob Crookston Weeks in Treatment: 0 Subjective Chief  Complaint Information obtained from Patient Left Great Toe History of Present Illness (HPI) 11/27/17 on evaluation today patient presents for initial evaluation concerning an issue that he has been having with his right great toe which began last Thursday 11/22/17. He has a history of diabetes mellitus type to which he has had for greater than 30 years currently he is on insulin. Subsequently he also has a history of hypertension. On physical exam inspection is also appears he may have some peripheral vascular disease with his ABI's being noncompressible bilaterally. He is on dialysis as well and this is on Monday, Wednesday, and Friday. Patient was hospitalized back in January/February 2019 although this was more for stomach/back pain though they never told me exactly what was going on. This is according to the patient. Subsequently the ulcer which is between the third and fourth toe when space of the right foot as well is on the plantar aspect of the fourth toe is due to him having cleaned his toes this morning and he states that his finger which is large split the toe tissue in between causing the wounds that we currently see. With that being said he states this has happened before I explained I would definitely recommend that he not do this any longer. He can use a small soft washcloth to clean between the toes without causing trauma. Subsequently the right great toe hatchery does show evidence of necrotic tissue present on the surface of the wound specifically there is callous and dead skin surrounding which is trapping fluid causing problems as far as the wound is concerned. The surface of the wound does show slough although due to his vascular flow I'm not going to sharply debride this today I think I will selectively debride away the necrotic skin as well is callous from around the surrounding so this will not continue to be a moisture issue. Nonetheless the patient has no pain he does have  diabetic neuropathy. 12/06/17-He is here in follow-up evaluation for a right great toe ulcer. He is voicing no complaints or concerns. He continues to infrequently/socially smokes cigars with no desire to quit. He has been compliant with offloading, using open toed surgical shoe; has been compliant using Santyl daily. He continues on clindamycin although takes it inconsistently secondary to indigestion. The plain film x-ray performed on 5/23 impression: Soft tissue swelling of the left first toe and is noted with probable irregular lucency involving distal tuft of first distal phalanx concerning for osteomyelitis. MRI may be performed for further evaluation; MRI ordered. The vascular evaluation performed on 5/24 field non-compressible ABIs bilaterally and reduced TBI bilaterally suggesting significant tibial disease. He will be referred to vascular medicine for further evaluation. 12/13/17-He is here in follow-up evaluation for right great toe ulcer. He has appointment next Thursday for the MRI and vascular evaluation. Wound culture obtained last week grew abundant Klebsiella oxytoca (multidrug sensitivity), and abundant enteric coccus faecalis (sensitive to ampicillin). Ampicillin and Cipro were called in, along with a probiotic. In light of his appointments next Thursday he will follow-up in 2 weeks, we will continue with Santyl 12/27/17-He is here for evaluation for a right great toe ulcer. 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 He did have evaluation by vascular medicine, Dr. Ronalee Belts, on 6/13, at that appointment it was decided for him to undergo angiography with possible intervention but he refused and  is still considering. If he chooses to have it done he will contact the office. He has not heard back from either ID offices that he was referred to two weeks ago: [place] and [place]. there is improvement in both  appearance and measurement to the ulcer. We will extend antibiotic therapy (amoxicillin 500 every 12, Cipro 500 daily) for additional 2 weeks pending infectious disease consult, he will continue with Santyl daily. He was reminded to continue with probiotic therapy while on oral antibiotics; he denies any GI disturbance. 01/03/18-He is here in follow-up evaluation for right great toe ulcer. He is decided to not undergo any vascular intervention at this time. He was established with [facility] infectious disease (Dr. Linus Salmons) last week initiated on vancomycin and cefazolin with dialysis treatment for 6 weeks. We will continue with current treatment plan with Santyl and offloading and he will follow- up in 2 weeks 01/17/18-He is seen in follow-up evaluation for right great toe ulcer. There is improvement in appearance with less nonviable tissue present. We will continue with same treatment plan he will follow-up next week. He is tolerating IV antibiotics without Timothy Gibson, Timothy Gibson. ([id number]) any complications 6/94/85-IO is seen in follow up evaluation for right great toe ulcer. There continues to be improvement. He is tolerating IV antibiotics with hemodialysis, completion date of 7/31. We will transition to collagen and and follow-up next week 02/07/18-He is here in follow up for a right great toe ulcer. There is red granulation tissue throughout the wound, improved in appearance. He completed antibiotics yesterday. He saw podiatry last Thursday for nail trimming, at that appointment he periwound callus was trimmed and a culture was obtained; culture negative. He does not have a follow up appointment with infectious disease. We will submit for grafix. 02/14/18-He is here in follow-up evaluation for a right great toe ulcer. There is slow improvement, red granulation tissue throughout. The insurance approval for grafix is pending. We will continue with collagen and he'll follow-up next week 02/21/18-He is  seen in follow-up evaluation for right great toe ulcer. There is improvement with red granulation tissue throughout. He was approved for oasis and this was placed today. He will follow up next week 02/28/18-He is seen in follow-up evaluation for right great toe ulcer. The wound is stable, oasis applied and he'll follow-up next week 03/07/18-He is seen in follow-up violation of her right great toe ulcer. The wound is dry, no evidence of drainage. The wound appears healed today. He was painted with Betadine, instructed to paint with Betadine daily, cover with band-aid for the next week and then cover with band-aid for the following week continue with surgical shoe. He was encouraged to contact the clinic with any evidence of drainage, or change in appearance to toe/wound. He will be discharged from wound care services Readmission: 07/14/18 on evaluation today patient presents for initial evaluation our clinic concerning issues that he is having with his left great toe. We previously saw him in 2019 for his right great toe which was actually worse at that time and subsequently was able to be completely healed in the end although it did take several months. With that being said the patient is currently having an area on his left great toe that occurred initially as result of a blister that came up he's not really sure why or how. Subsequently this was initially treated by his podiatrist though the recommendation there was apparently amputation according to the patient and his daughter who was present with him today.  With that being said the patient did not want to proceed with amputation and would like to try to perform wound care and get this to heal without having to go down that road. We were able to do that with his other toe and so he would like to at least give that a try before going forward with amputation. Again I completely understand his concern in this regard. With that being said I did discuss  with the patient obviously that it's always a chance that we cannot get this to heal with normal wound care measures but obviously we will give it our best shot. He does actually have an appointment scheduled for later today with the vascular specialist in order to evaluate his blood flow. That was at the recommendation of his podiatrist as well. Obviously if he is having good arterial flow and nonetheless is still experiencing issues with having the wound delay in healing then it may simply be a matter of we need to initiate more aggressive wound to therapy in order to see if we can get this to heal. No fevers, chills, nausea, or vomiting noted at this time. The patient notes that this woman has been present for approximately three weeks. He is not a smoker. His most recent hemoglobin A1c was 6.2 on 06/18/19. 07/17/2019 on evaluation today patient appears unfortunately to not be doing as well. He did see vascular I did review the note as well. That was on 07/15/2019. Subsequently there can actually be taking him to the OR for an angiogram due to poor arterial flow into this extremity. Apparently the atherosclerotic changes were severe. The patient is stated to be at risk for limb loss. Nonetheless the good news is this is being scheduled fairly quickly he will have the surgery/procedure next Thursday. With that being said I am going also go ahead based on the results of his x-ray which showed some chance of osteomyelitis in the distal portion of the toe get the patient set up for an MRI to further evaluate for the possibility of osteomyelitis obviously if he does have osteomyelitis we need to know so that we can address this appropriately. Objective Constitutional Well-nourished and well-hydrated in no acute distress. Vitals Time Taken: 2:25 PM, Height: 76 in, Weight: 244 lbs, BMI: 29.7, Temperature: 98.0 F, Pulse: 97 bpm, Respiratory Timothy Gibson, Timothy Gibson. ([id number]) Rate: 18 breaths/min, Blood  Pressure: 183/93 mmHg. Respiratory normal breathing without difficulty. Psychiatric this patient is able to make decisions and demonstrates good insight into disease process. Alert and Oriented x 3. pleasant and cooperative. General Notes: Patient's wound currently did appear to be somewhat dry but I am actually okay with that obviously if there is necrotic tissue/gangrene on the end of the toe it is better for her to be dry and then to be wet. With that being said I do believe at some point this may need to be debrided away but right now based on the severe arterial disease I am not going to perform any debridement today. The patient understands and is in agreement at this point. His daughter again was present during the evaluation today. Integumentary (Hair, Skin) Wound #3 status is Open. Original cause of wound was Gradually Appeared. The wound is located on the Left Toe Great. The wound measures 2.3cm length x 3cm width x 0.1cm depth; 5.419cm^2 area and 0.542cm^3 volume. There is Fat Layer (Subcutaneous Tissue) Exposed exposed. There is a medium amount of serosanguineous drainage noted. The wound margin is flat  and intact. There is small (1-33%) red granulation within the wound bed. There is a large (67-100%) amount of necrotic tissue within the wound bed including Eschar. Assessment Active Problems ICD-10 Type 2 diabetes mellitus with foot ulcer Non-pressure chronic ulcer of other part of left foot with fat layer exposed Essential (primary) hypertension Plan Wound Cleansing: Wound #3 Left Toe Great: Dial antibacterial soap, wash wounds, rinse and pat dry prior to dressing wounds May Shower, gently pat wound dry prior to applying new dressing. Anesthetic (add to Medication List): Wound #3 Left Toe Great: Topical Lidocaine 4% cream applied to wound bed prior to debridement (In Clinic Only). Primary Wound Dressing: Wound #3 Left Toe Great: Iodoflex Secondary Dressing: Wound #3  Left Toe Great: Gauze and Kerlix/Conform Foam Dressing Change Frequency: Timothy Gibson, Timothy Gibson ([id number]) Wound #3 Left Toe Great: Change dressing every other day. Follow-up Appointments: Wound #3 Left Toe Great: Return Appointment in 2 weeks. Off-Loading: Wound #3 Left Toe Great: Open toe surgical shoe to: - left foot Radiology ordered were: MRI, lower extremity without contrast - left foot The following medication(s) was prescribed: doxycycline hyclate oral 100 mg capsule 1 1 capsule oral taken 2 times a day for 14 days starting 07/17/2019 1. I would recommend currently that we go ahead and continue with management for this patient I will see him back following his vascular procedure in the meantime we will get a continue with Iodoflex for the time being. 2. I recommend as well that he not put any tight wrapping on the toe obviously needs to have a dressing on it secured but nothing to tense. 3. I would recommend as well that the patient continue to use the open toe surgical shoe which I think is appropriate. Number form also get a go ahead and place the patient on doxycycline prophylactically based on the possibility of infection here and I am also going to go ahead and order an MRI of the left foot to evaluate for osteomyelitis at the toe since the x-ray seemed to suggest that this may be the case but not confirmed but definitely. If he does turn out positive for hyperbaric oxygen therapy I think he is definitely a candidate osteomyelitis I think the patient would be a candidate for hyperbaric oxygen therapy. We will see patient back for reevaluation in 1 week here in the clinic. If anything worsens or changes patient will contact our office for additional recommendations. Electronic Signature(s) Signed: 07/18/2019 9:23:10 AM By: Worthy Keeler PA-C Entered By: Worthy Keeler on 07/18/2019 09:23:10 Timothy Gibson, Timothy Gibson  ([id number]) -------------------------------------------------------------------------------- SuperBill Details Patient Name: Timothy Gibson Date of Service: 07/17/2019 Medical Record Number: [id number] Patient Account Number: [id number] Date of Birth/Sex: [birth year] (68 y.o. M) Treating RN: Army Melia Primary Care Provider: Merrie Roof Other Clinician: Referring Provider: Merrie Roof Treating Provider/Extender: Melburn Hake, Shadell Brenn Weeks in Treatment: 0 Diagnosis Coding ICD-10 Codes Code Description E11.621 Type 2 diabetes mellitus with foot ulcer L97.522 Non-pressure chronic ulcer of other part of left foot with fat layer exposed I10 Essential (primary) hypertension Facility Procedures CPT4 Code: [id number] Description: 99213 - WOUND CARE VISIT-LEV 3 EST PT Modifier: Quantity: 1 Physician Procedures CPT4 Code: [id number] Description: 59163 - WC PHYS LEVEL 4 - EST PT ICD-10 Diagnosis Description E11.621 Type 2 diabetes mellitus with foot ulcer L97.522 Non-pressure chronic ulcer of other part of left foot with fat I10 Essential (primary) hypertension Modifier: layer exposed Quantity: 1 Electronic Signature(s) Signed: 07/18/2019 9:23:44 AM By: Worthy Keeler PA-C Entered  By: Worthy Keeler on 07/18/2019 09:23:43

## 2019-07-18 DIAGNOSIS — N2581 Secondary hyperparathyroidism of renal origin: Secondary | ICD-10-CM | POA: Diagnosis not present

## 2019-07-18 DIAGNOSIS — N186 End stage renal disease: Secondary | ICD-10-CM | POA: Diagnosis not present

## 2019-07-18 DIAGNOSIS — D509 Iron deficiency anemia, unspecified: Secondary | ICD-10-CM | POA: Diagnosis not present

## 2019-07-18 DIAGNOSIS — D631 Anemia in chronic kidney disease: Secondary | ICD-10-CM | POA: Diagnosis not present

## 2019-07-18 DIAGNOSIS — Z992 Dependence on renal dialysis: Secondary | ICD-10-CM | POA: Diagnosis not present

## 2019-07-19 ENCOUNTER — Other Ambulatory Visit: Payer: Self-pay

## 2019-07-19 ENCOUNTER — Ambulatory Visit
Admission: RE | Admit: 2019-07-19 | Discharge: 2019-07-19 | Disposition: A | Discharge status: Home / Self Care | Payer: Medicare Other | Source: Ambulatory Visit | Attending: Physician Assistant | Admitting: Physician Assistant

## 2019-07-19 DIAGNOSIS — L97522 Non-pressure chronic ulcer of other part of left foot with fat layer exposed: Secondary | ICD-10-CM | POA: Insufficient documentation

## 2019-07-19 DIAGNOSIS — S91102A Unspecified open wound of left great toe without damage to nail, initial encounter: Secondary | ICD-10-CM | POA: Diagnosis not present

## 2019-07-21 DIAGNOSIS — N186 End stage renal disease: Secondary | ICD-10-CM | POA: Diagnosis not present

## 2019-07-21 DIAGNOSIS — N2581 Secondary hyperparathyroidism of renal origin: Secondary | ICD-10-CM | POA: Diagnosis not present

## 2019-07-21 DIAGNOSIS — D509 Iron deficiency anemia, unspecified: Secondary | ICD-10-CM | POA: Diagnosis not present

## 2019-07-21 DIAGNOSIS — E119 Type 2 diabetes mellitus without complications: Secondary | ICD-10-CM | POA: Diagnosis not present

## 2019-07-21 DIAGNOSIS — D631 Anemia in chronic kidney disease: Secondary | ICD-10-CM | POA: Diagnosis not present

## 2019-07-21 DIAGNOSIS — Z992 Dependence on renal dialysis: Secondary | ICD-10-CM | POA: Diagnosis not present

## 2019-07-22 ENCOUNTER — Ambulatory Visit: Payer: Medicare Other | Admitting: Physician Assistant

## 2019-07-22 ENCOUNTER — Other Ambulatory Visit
Admission: RE | Admit: 2019-07-22 | Discharge: 2019-07-22 | Disposition: A | Discharge status: Home / Self Care | Payer: Medicare Other | Source: Ambulatory Visit | Attending: Vascular Surgery | Admitting: Vascular Surgery

## 2019-07-22 ENCOUNTER — Other Ambulatory Visit: Payer: Self-pay

## 2019-07-22 DIAGNOSIS — Z01812 Encounter for preprocedural laboratory examination: Secondary | ICD-10-CM | POA: Insufficient documentation

## 2019-07-22 DIAGNOSIS — Z20822 Contact with and (suspected) exposure to covid-19: Secondary | ICD-10-CM | POA: Insufficient documentation

## 2019-07-22 LAB — SARS CORONAVIRUS 2 (TAT 6-24 HRS): SARS Coronavirus 2: NEGATIVE

## 2019-07-23 ENCOUNTER — Other Ambulatory Visit (INDEPENDENT_AMBULATORY_CARE_PROVIDER_SITE_OTHER): Payer: Self-pay | Admitting: Nurse Practitioner

## 2019-07-23 DIAGNOSIS — D631 Anemia in chronic kidney disease: Secondary | ICD-10-CM | POA: Diagnosis not present

## 2019-07-23 DIAGNOSIS — D509 Iron deficiency anemia, unspecified: Secondary | ICD-10-CM | POA: Diagnosis not present

## 2019-07-23 DIAGNOSIS — N186 End stage renal disease: Secondary | ICD-10-CM | POA: Diagnosis not present

## 2019-07-23 DIAGNOSIS — N2581 Secondary hyperparathyroidism of renal origin: Secondary | ICD-10-CM | POA: Diagnosis not present

## 2019-07-23 DIAGNOSIS — Z992 Dependence on renal dialysis: Secondary | ICD-10-CM | POA: Diagnosis not present

## 2019-07-24 ENCOUNTER — Encounter: Payer: Self-pay | Admitting: Vascular Surgery

## 2019-07-24 ENCOUNTER — Encounter
Admission: RE | Disposition: A | Discharge status: Home / Self Care | Payer: Self-pay | Source: Home / Self Care | Attending: Vascular Surgery

## 2019-07-24 ENCOUNTER — Other Ambulatory Visit: Payer: Self-pay

## 2019-07-24 ENCOUNTER — Ambulatory Visit
Admission: RE | Admit: 2019-07-24 | Discharge: 2019-07-24 | Disposition: A | Discharge status: Home / Self Care | Payer: Medicare Other | Attending: Vascular Surgery | Admitting: Vascular Surgery

## 2019-07-24 ENCOUNTER — Ambulatory Visit: Payer: Medicare Other

## 2019-07-24 DIAGNOSIS — Z79899 Other long term (current) drug therapy: Secondary | ICD-10-CM | POA: Insufficient documentation

## 2019-07-24 DIAGNOSIS — I70299 Other atherosclerosis of native arteries of extremities, unspecified extremity: Secondary | ICD-10-CM

## 2019-07-24 DIAGNOSIS — E1122 Type 2 diabetes mellitus with diabetic chronic kidney disease: Secondary | ICD-10-CM | POA: Insufficient documentation

## 2019-07-24 DIAGNOSIS — Z794 Long term (current) use of insulin: Secondary | ICD-10-CM | POA: Diagnosis not present

## 2019-07-24 DIAGNOSIS — E11621 Type 2 diabetes mellitus with foot ulcer: Secondary | ICD-10-CM | POA: Insufficient documentation

## 2019-07-24 DIAGNOSIS — E1151 Type 2 diabetes mellitus with diabetic peripheral angiopathy without gangrene: Secondary | ICD-10-CM | POA: Insufficient documentation

## 2019-07-24 DIAGNOSIS — I120 Hypertensive chronic kidney disease with stage 5 chronic kidney disease or end stage renal disease: Secondary | ICD-10-CM | POA: Insufficient documentation

## 2019-07-24 DIAGNOSIS — F17210 Nicotine dependence, cigarettes, uncomplicated: Secondary | ICD-10-CM | POA: Diagnosis not present

## 2019-07-24 DIAGNOSIS — N186 End stage renal disease: Secondary | ICD-10-CM | POA: Insufficient documentation

## 2019-07-24 DIAGNOSIS — L97529 Non-pressure chronic ulcer of other part of left foot with unspecified severity: Secondary | ICD-10-CM | POA: Insufficient documentation

## 2019-07-24 DIAGNOSIS — I70262 Atherosclerosis of native arteries of extremities with gangrene, left leg: Secondary | ICD-10-CM | POA: Diagnosis not present

## 2019-07-24 DIAGNOSIS — Z992 Dependence on renal dialysis: Secondary | ICD-10-CM | POA: Insufficient documentation

## 2019-07-24 HISTORY — PX: LOWER EXTREMITY ANGIOGRAPHY: CATH118251

## 2019-07-24 LAB — GLUCOSE, CAPILLARY
Glucose-Capillary: 82 mg/dL (ref 70–99)
Glucose-Capillary: 74 mg/dL (ref 70–99)

## 2019-07-24 LAB — BUN: BUN: 32 mg/dL — ABNORMAL HIGH (ref 8–23)

## 2019-07-24 LAB — CREATININE, SERUM
Creatinine, Ser: 8.43 mg/dL — ABNORMAL HIGH (ref 0.61–1.24)
GFR calc Af Amer: 7 mL/min — ABNORMAL LOW (ref >60)
GFR calc non Af Amer: 6 mL/min — ABNORMAL LOW (ref >60)

## 2019-07-24 SURGERY — LOWER EXTREMITY ANGIOGRAPHY
Anesthesia: Moderate Sedation | Site: Leg Lower | Laterality: Left

## 2019-07-24 MED ORDER — ACETAMINOPHEN 325 MG PO TABS: 650.0000 mg | ORAL_TABLET | ORAL | Status: DC | PRN

## 2019-07-24 MED ORDER — SODIUM CHLORIDE 0.9 % IV SOLN: INTRAVENOUS | Status: DC

## 2019-07-24 MED ORDER — MIDAZOLAM HCL 5 MG/5ML IJ SOLN
INTRAMUSCULAR | Status: AC
  Filled 2019-07-24: qty 5

## 2019-07-24 MED ORDER — CLOPIDOGREL BISULFATE 300 MG PO TABS: 300.0000 mg | ORAL_TABLET | Freq: Once | ORAL | Status: AC

## 2019-07-24 MED ORDER — METHYLPREDNISOLONE SODIUM SUCC 125 MG IJ SOLR: 125.0000 mg | Freq: Once | INTRAMUSCULAR | Status: DC | PRN

## 2019-07-24 MED ORDER — MIDAZOLAM HCL 2 MG/2ML IJ SOLN
INTRAMUSCULAR | Status: DC | PRN
  Administered 2019-07-24: 2 mg via INTRAVENOUS

## 2019-07-24 MED ORDER — ONDANSETRON HCL 4 MG/2ML IJ SOLN: 4.0000 mg | Freq: Four times a day (QID) | INTRAMUSCULAR | Status: DC | PRN

## 2019-07-24 MED ORDER — DIPHENHYDRAMINE HCL 50 MG/ML IJ SOLN: 50.0000 mg | Freq: Once | INTRAMUSCULAR | Status: DC | PRN

## 2019-07-24 MED ORDER — LABETALOL HCL 5 MG/ML IV SOLN
INTRAVENOUS | Status: DC | PRN
  Administered 2019-07-24 (×2): 10 mg via INTRAVENOUS

## 2019-07-24 MED ORDER — HYDRALAZINE HCL 20 MG/ML IJ SOLN: 5.0000 mg | INTRAMUSCULAR | Status: DC | PRN

## 2019-07-24 MED ORDER — LABETALOL HCL 5 MG/ML IV SOLN: 10.0000 mg | INTRAVENOUS | Status: DC | PRN

## 2019-07-24 MED ORDER — CLOPIDOGREL BISULFATE 75 MG PO TABS: 75.0000 mg | ORAL_TABLET | Freq: Every day | ORAL | 11 refills | Status: DC

## 2019-07-24 MED ORDER — CEFAZOLIN SODIUM-DEXTROSE 1-4 GM/50ML-% IV SOLN: 1.0000 g | Freq: Once | INTRAVENOUS | Status: DC

## 2019-07-24 MED ORDER — FENTANYL CITRATE (PF) 100 MCG/2ML IJ SOLN
INTRAMUSCULAR | Status: DC | PRN
  Administered 2019-07-24: 50 ug via INTRAVENOUS

## 2019-07-24 MED ORDER — ATORVASTATIN CALCIUM 10 MG PO TABS: 10.0000 mg | ORAL_TABLET | Freq: Every day | ORAL | 11 refills | Status: DC

## 2019-07-24 MED ORDER — ASPIRIN EC 81 MG PO TBEC: 81.0000 mg | DELAYED_RELEASE_TABLET | Freq: Every day | ORAL | 2 refills | Status: DC

## 2019-07-24 MED ORDER — MIDAZOLAM HCL 2 MG/ML PO SYRP: 8.0000 mg | ORAL_SOLUTION | Freq: Once | ORAL | Status: DC | PRN

## 2019-07-24 MED ORDER — HEPARIN SODIUM (PORCINE) 1000 UNIT/ML IJ SOLN
INTRAMUSCULAR | Status: DC | PRN
  Administered 2019-07-24: 6000 [IU] via INTRAVENOUS

## 2019-07-24 MED ORDER — CLOPIDOGREL BISULFATE 300 MG PO TABS: 300.0000 mg | ORAL_TABLET | Freq: Once | ORAL | 0 refills | Status: AC

## 2019-07-24 MED ORDER — ASPIRIN EC 81 MG PO TBEC: 81.0000 mg | DELAYED_RELEASE_TABLET | Freq: Every day | ORAL | Status: DC

## 2019-07-24 MED ORDER — HEPARIN SODIUM (PORCINE) 1000 UNIT/ML IJ SOLN
INTRAMUSCULAR | Status: AC
  Filled 2019-07-24: qty 1

## 2019-07-24 MED ORDER — FENTANYL CITRATE (PF) 100 MCG/2ML IJ SOLN
INTRAMUSCULAR | Status: AC
  Filled 2019-07-24: qty 2

## 2019-07-24 MED ORDER — ATORVASTATIN CALCIUM 10 MG PO TABS: 10.0000 mg | ORAL_TABLET | Freq: Every day | ORAL | Status: DC

## 2019-07-24 MED ORDER — CLOPIDOGREL BISULFATE 75 MG PO TABS: 75.0000 mg | ORAL_TABLET | Freq: Every day | ORAL | Status: DC

## 2019-07-24 MED ORDER — SODIUM CHLORIDE 0.9% FLUSH: 3.0000 mL | INTRAVENOUS | Status: DC | PRN

## 2019-07-24 MED ORDER — HYDROMORPHONE HCL 1 MG/ML IJ SOLN: 1.0000 mg | Freq: Once | INTRAMUSCULAR | Status: DC | PRN

## 2019-07-24 MED ORDER — CEFAZOLIN SODIUM-DEXTROSE 1-4 GM/50ML-% IV SOLN
INTRAVENOUS | Status: AC
  Administered 2019-07-24: 12:00:00 1 g
  Filled 2019-07-24: qty 50

## 2019-07-24 MED ORDER — FAMOTIDINE 20 MG PO TABS: 40.0000 mg | ORAL_TABLET | Freq: Once | ORAL | Status: DC | PRN

## 2019-07-24 MED ORDER — LABETALOL HCL 5 MG/ML IV SOLN
INTRAVENOUS | Status: AC
  Filled 2019-07-24: qty 4

## 2019-07-24 MED ORDER — IODIXANOL 320 MG/ML IV SOLN
INTRAVENOUS | Status: DC | PRN
  Administered 2019-07-24: 12:00:00 80 mL via INTRA_ARTERIAL

## 2019-07-24 MED ORDER — SODIUM CHLORIDE 0.9 % IV SOLN: 250.0000 mL | INTRAVENOUS | Status: DC | PRN

## 2019-07-24 MED ORDER — SODIUM CHLORIDE 0.9% FLUSH: 3.0000 mL | Freq: Two times a day (BID) | INTRAVENOUS | Status: DC

## 2019-07-24 MED ORDER — CLOPIDOGREL BISULFATE 75 MG PO TABS
ORAL_TABLET | ORAL | Status: AC
  Administered 2019-07-24: 14:00:00 300 mg via ORAL
  Filled 2019-07-24: qty 4

## 2019-07-24 SURGICAL SUPPLY — 16 items
BALLN LUTONIX 018 4X80X130 (BALLOONS) ×2
BALLN LUTONIX 6X150X130 (BALLOONS) ×2
BALLOON LUTONIX 018 4X80X130 (BALLOONS) ×1 IMPLANT
BALLOON LUTONIX 6X150X130 (BALLOONS) ×1 IMPLANT
CATH BEACON 5 .038 100 VERT TP (CATHETERS) ×2 IMPLANT
CATH PIG 70CM (CATHETERS) ×2 IMPLANT
DEVICE PRESTO INFLATION (MISCELLANEOUS) ×2 IMPLANT
DEVICE STARCLOSE SE CLOSURE (Vascular Products) ×2 IMPLANT
GLIDEWIRE ADV .035X260CM (WIRE) ×2 IMPLANT
PACK ANGIOGRAPHY (CUSTOM PROCEDURE TRAY) ×2 IMPLANT
SHEATH ANL2 6FRX45 HC (SHEATH) ×2 IMPLANT
SHEATH BRITE TIP 5FRX11 (SHEATH) ×2 IMPLANT
SYR MEDRAD MARK 7 150ML (SYRINGE) ×2 IMPLANT
TUBING CONTRAST HIGH PRESS 72 (TUBING) ×2 IMPLANT
WIRE G V18X300CM (WIRE) ×2 IMPLANT
WIRE J 3MM .035X145CM (WIRE) ×2 IMPLANT

## 2019-07-24 NOTE — Progress Notes (Addendum)
Per Dr. Lucky Cowboy at bedside, [facility] for Patient to have 300 mg Plavix loading dose at hospital before leaving, To receive aspirin and Lipitor after d/c at home. Start plavix 75 mg po at home 07/25/2019. D/C teaching and medication teaching done with Patient and Care giver.

## 2019-07-24 NOTE — Op Note (Signed)
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  Ultrasound guidance for vascular access right femoral artery             2.  Catheter placement into left common femoral artery from right femoral approach             3.  Aortogram and selective left lower extremity angiogram             4.  Percutaneous transluminal angioplasty of left tibioperoneal trunk and proximal peroneal artery with 4 mm diameter by 8 cm length Lutonix drug-coated angioplasty balloon             5.   Percutaneous transluminal angioplasty of the left distal SFA and most proximal popliteal artery with 6 mm diameter by 15 cm likely tonics drug-coated angioplasty balloon  6.  StarClose closure device right femoral artery  EBL: 10 cc  Contrast: 80 cc  Fluoro Time: 8.1 minutes  Moderate Conscious Sedation Time: approximately 25 minutes using 2 mg of Versed and 50 mcg of Fentanyl              Indications:  Patient is a 69 y.o.male with ulceration, infection and some gangrenous changes to his toe on his left foot.  He has multiple medical comorbidities including renal failure. The patient has noninvasive study showing elevated velocities near the origin of the anterior tibial artery with reduced perfusion distally. The patient is brought in for angiography for further evaluation and potential treatment.  Due to the limb threatening nature of the situation, angiogram was performed for attempted limb salvage. The patient is aware that if the procedure fails, amputation would be expected.  The patient also understands that even with successful revascularization, amputation may still be required due to the severity of the situation.  Risks and benefits are discussed and informed consent is  obtained.   Procedure:  The patient was identified and appropriate procedural time out was performed.  The patient was then placed supine on the table and prepped and draped in the usual sterile fashion. Moderate conscious sedation was administered during a face to face encounter with the patient throughout the procedure with my supervision of the RN administering medicines and monitoring the patient's vital signs, pulse oximetry, telemetry and mental status throughout from the start of the procedure until the patient was taken to the recovery room. Ultrasound was used to evaluate the right common femoral artery.  It was patent .  A digital ultrasound image was acquired.  A Seldinger needle was used to access the right common femoral artery under direct ultrasound guidance and a permanent image was performed.  A 0.035 J wire was advanced without resistance and a 5Fr sheath was placed.  Pigtail catheter was placed into the aorta and an AP aortogram was performed. The right renal artery appeared occluded with no flow.  Left kidney artery had sluggish flow but was widely patent.  The aorta and iliac arteries are calcified but not stenotic. I then crossed the aortic bifurcation and advanced to the left femoral head. Selective left lower extremity angiogram was then performed. This demonstrated a calcific but not stenotic common femoral artery, profunda femoris artery, and proximal superficial femoral artery with only mild disease.  The  distal SFA had a highly calcific lesion at Hunter's canal with 50 to 60% stenosis.  The popliteal artery below the proximal segment normalized down to the distal popliteal artery and tibioperoneal trunk where there was about an 80% stenosis.  The anterior tibial artery takeoff had mild disease in the anterior tibial artery was continuous although it did with her down in the foot and ankle.  There were collaterals off of an occluded peroneal artery in the midsegment that help feed into  the foot.  I felt that treating that tibioperoneal trunk lesion would still be helpful for improving the collateral since the anterior tibial artery distal disease was likely not amenable to treatment. It was felt that it was in the patient's best interest to proceed with intervention after these images to avoid a second procedure and a larger amount of contrast and fluoroscopy based off of the findings from the initial angiogram. The patient was systemically heparinized and a 6 [place] Ansell sheath was then placed over the [employer] wire. I then used a Kumpe catheter and the advantage wire to navigate through the SFA and popliteal lesion and then exchanged for a 0.018 wire.  I used a 0.018 wire to cross the tibioperoneal trunk stenosis and parked this down in the mid to distal calf.  He was a large man with large vessels and a 4 mm diameter by 8 cm length Lutonix drug-coated angioplasty balloon was appropriately sized to his tibioperoneal trunk and inflated to 8 atm for 1 minute.  Completion imaging showed only about a 10% residual stenosis.  I then turned my attention to the distal SFA lesion.  A 6 mm diameter by 15 cm likely tonics drug-coated angioplasty balloon was inflated to 12 atm for 1 minute.  Completion imaging showed about a 25% residual stenosis that did not appear flow-limiting. I elected to terminate the procedure. The sheath was removed and StarClose closure device was deployed in the right femoral artery with excellent hemostatic result. The patient was taken to the recovery room in stable condition having tolerated the procedure well.  Findings:               Aortogram:  Right renal artery appeared occluded with no flow.  Left kidney artery had sluggish flow but was widely patent.  The aorta and iliac arteries are calcified but not stenotic.             Left lower Extremity:  Right renal artery appeared occluded with no flow.  Left kidney artery had sluggish flow but was widely patent.   The aorta and iliac arteries are calcified but not stenotic.   Disposition: Patient was taken to the recovery room in stable condition having tolerated the procedure well.  Complications: None  Leotis Pain 07/24/2019 12:16 PM   This note was created with Dragon Medical transcription system. Any errors in dictation are purely unintentional.

## 2019-07-24 NOTE — H&P (Signed)
[facility] History & Physical Update  The patient was interviewed and re-examined.  The patient's previous History and Physical has been reviewed and is unchanged.  There is no change in the plan of care. We plan to proceed with the scheduled procedure.  Leotis Pain, MD  07/24/2019, 9:03 AM

## 2019-07-25 ENCOUNTER — Encounter: Payer: Self-pay | Admitting: Cardiology

## 2019-07-25 DIAGNOSIS — Z992 Dependence on renal dialysis: Secondary | ICD-10-CM | POA: Diagnosis not present

## 2019-07-25 DIAGNOSIS — N2581 Secondary hyperparathyroidism of renal origin: Secondary | ICD-10-CM | POA: Diagnosis not present

## 2019-07-25 DIAGNOSIS — D631 Anemia in chronic kidney disease: Secondary | ICD-10-CM | POA: Diagnosis not present

## 2019-07-25 DIAGNOSIS — N186 End stage renal disease: Secondary | ICD-10-CM | POA: Diagnosis not present

## 2019-07-25 DIAGNOSIS — D509 Iron deficiency anemia, unspecified: Secondary | ICD-10-CM | POA: Diagnosis not present

## 2019-07-28 DIAGNOSIS — D631 Anemia in chronic kidney disease: Secondary | ICD-10-CM | POA: Diagnosis not present

## 2019-07-28 DIAGNOSIS — N186 End stage renal disease: Secondary | ICD-10-CM | POA: Diagnosis not present

## 2019-07-28 DIAGNOSIS — Z992 Dependence on renal dialysis: Secondary | ICD-10-CM | POA: Diagnosis not present

## 2019-07-28 DIAGNOSIS — D509 Iron deficiency anemia, unspecified: Secondary | ICD-10-CM | POA: Diagnosis not present

## 2019-07-28 DIAGNOSIS — N2581 Secondary hyperparathyroidism of renal origin: Secondary | ICD-10-CM | POA: Diagnosis not present

## 2019-07-29 ENCOUNTER — Other Ambulatory Visit: Payer: Self-pay

## 2019-07-29 ENCOUNTER — Encounter: Payer: Medicare Other | Admitting: Physician Assistant

## 2019-07-29 DIAGNOSIS — M069 Rheumatoid arthritis, unspecified: Secondary | ICD-10-CM | POA: Diagnosis not present

## 2019-07-29 DIAGNOSIS — L97522 Non-pressure chronic ulcer of other part of left foot with fat layer exposed: Secondary | ICD-10-CM | POA: Diagnosis not present

## 2019-07-29 DIAGNOSIS — N186 End stage renal disease: Secondary | ICD-10-CM | POA: Diagnosis not present

## 2019-07-29 DIAGNOSIS — M86672 Other chronic osteomyelitis, left ankle and foot: Secondary | ICD-10-CM | POA: Diagnosis not present

## 2019-07-29 DIAGNOSIS — I120 Hypertensive chronic kidney disease with stage 5 chronic kidney disease or end stage renal disease: Secondary | ICD-10-CM | POA: Diagnosis not present

## 2019-07-29 DIAGNOSIS — E1122 Type 2 diabetes mellitus with diabetic chronic kidney disease: Secondary | ICD-10-CM | POA: Diagnosis not present

## 2019-07-29 DIAGNOSIS — E11621 Type 2 diabetes mellitus with foot ulcer: Secondary | ICD-10-CM | POA: Diagnosis not present

## 2019-07-29 NOTE — Progress Notes (Signed)
[name], [name] ([id number]) Visit Report for 07/29/2019 Chief Complaint Document Details Patient Name: Timothy Gibson, Timothy Gibson Date of Service: 07/29/2019 2:45 PM Medical Record Number: [id number] Patient Account Number: [id number] Date of Birth/Sex: [birth year] (69 y.o. M) Treating RN: Cornell Barman Primary Care Provider: Merrie Roof Other Clinician: Referring Provider: Merrie Roof Treating Provider/Extender: Melburn Hake, Md Smola Weeks in Treatment: 2 Information Obtained from: Patient Chief Complaint Left Great Toe Electronic Signature(s) Signed: 07/29/2019 2:42:12 PM By: Worthy Keeler PA-C Entered By: Worthy Keeler on 07/29/2019 14:42:10 Timothy Gibson ([id number]) -------------------------------------------------------------------------------- HPI Details Patient Name: Timothy Gibson Date of Service: 07/29/2019 2:45 PM Medical Record Number: [id number] Patient Account Number: [id number] Date of Birth/Sex: Jan 23, 1951 (68 y.o. M) Treating RN: Cornell Barman Primary Care Provider: Merrie Roof Other Clinician: Referring Provider: Merrie Roof Treating Provider/Extender: Melburn Hake, Zyon Rosser Weeks in Treatment: 2 History of Present Illness HPI Description: 11/27/17 on evaluation today patient presents for initial evaluation concerning an issue that he has been having with his right great toe which began last Thursday 11/22/17. He has a history of diabetes mellitus type to which he has had for greater than 30 years currently he is on insulin. Subsequently he also has a history of hypertension. On physical exam inspection is also appears he may have some peripheral vascular disease with his ABI's being noncompressible bilaterally. He is on dialysis as well and this is on Monday, Wednesday, and Friday. Patient was hospitalized back in January/February 2019 although this was more for stomach/back pain though they never told me exactly what was going on. This is according to the patient. Subsequently  the ulcer which is between the third and fourth toe when space of the right foot as well is on the plantar aspect of the fourth toe is due to him having cleaned his toes this morning and he states that his finger which is large split the toe tissue in between causing the wounds that we currently see. With that being said he states this has happened before I explained I would definitely recommend that he not do this any longer. He can use a small soft washcloth to clean between the toes without causing trauma. Subsequently the right great toe hatchery does show evidence of necrotic tissue present on the surface of the wound specifically there is callous and dead skin surrounding which is trapping fluid causing problems as far as the wound is concerned. The surface of the wound does show slough although due to his vascular flow I'm not going to sharply debride this today I think I will selectively debride away the necrotic skin as well is callous from around the surrounding so this will not continue to be a moisture issue. Nonetheless the patient has no pain he does have diabetic neuropathy. 12/06/17-He is here in follow-up evaluation for a right great toe ulcer. He is voicing no complaints or concerns. He continues to infrequently/socially smokes cigars with no desire to quit. He has been compliant with offloading, using open toed surgical shoe; has been compliant using Santyl daily. He continues on clindamycin although takes it inconsistently secondary to indigestion. The plain film x-ray performed on 5/23 impression: Soft tissue swelling of the left first toe and is noted with probable irregular lucency involving distal tuft of first distal phalanx concerning for osteomyelitis. MRI may be performed for further evaluation; MRI ordered. The vascular evaluation performed on 5/24 field non-compressible ABIs bilaterally and reduced TBI bilaterally suggesting significant tibial disease. He will be referred  to vascular  medicine for further evaluation. 12/13/17-He is here in follow-up evaluation for right great toe ulcer. He has appointment next Thursday for the MRI and vascular evaluation. Wound culture obtained last week grew abundant Klebsiella oxytoca (multidrug sensitivity), and abundant enteric coccus faecalis (sensitive to ampicillin). Ampicillin and Cipro were called in, along with a probiotic. In light of his appointments next Thursday he will follow-up in 2 weeks, we will continue with Santyl 12/27/17-He is here for evaluation for a right great toe ulcer. 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 He did have evaluation by vascular medicine, Dr. Ronalee Belts, on 6/13, at that appointment it was decided for him to undergo angiography with possible intervention but he refused and is still considering. If he chooses to have it done he will contact the office. He has not heard back from either ID offices that he was referred to two weeks ago: [place] and [place]. there is improvement in both appearance and measurement to the ulcer. We will extend antibiotic therapy (amoxicillin 500 every 12, Cipro 500 daily) for additional 2 weeks pending infectious disease consult, he will continue with Santyl daily. He was reminded to continue with probiotic therapy while on oral antibiotics; he denies any GI disturbance. 01/03/18-He is here in follow-up evaluation for right great toe ulcer. He is decided to not undergo any vascular intervention at this time. He was established with [facility] infectious disease (Dr. Linus Salmons) last week initiated on vancomycin and cefazolin with dialysis treatment for 6 weeks. We will continue with current treatment plan with Santyl and offloading and he will follow- up in 2 weeks 01/17/18-He is seen in follow-up evaluation for right great toe ulcer. There is improvement in appearance with less nonviable tissue  present. We will continue with same treatment plan he will follow-up next week. He is tolerating IV antibiotics without any complications 4/43/15-QM is seen in follow up evaluation for right great toe ulcer. There continues to be improvement. He is tolerating IV antibiotics with hemodialysis, completion date of 7/31. We will transition to collagen and and follow-up next week 02/07/18-He is here in follow up for a right great toe ulcer. There is red granulation tissue throughout the wound, improved in appearance. He completed antibiotics yesterday. He saw podiatry last Thursday for nail trimming, at that appointment he periwound callus was trimmed and a culture was obtained; culture negative. He does not have a follow up appointment with CHEZ, BULNES. ([id number]) infectious disease. We will submit for grafix. 02/14/18-He is here in follow-up evaluation for a right great toe ulcer. There is slow improvement, red granulation tissue throughout. The insurance approval for grafix is pending. We will continue with collagen and he'll follow-up next week 02/21/18-He is seen in follow-up evaluation for right great toe ulcer. There is improvement with red granulation tissue throughout. He was approved for oasis and this was placed today. He will follow up next week 02/28/18-He is seen in follow-up evaluation for right great toe ulcer. The wound is stable, oasis applied and he'll follow-up next week 03/07/18-He is seen in follow-up violation of her right great toe ulcer. The wound is dry, no evidence of drainage. The wound appears healed today. He was painted with Betadine, instructed to paint with Betadine daily, cover with band-aid for the next week and then cover with band-aid for the following week continue with surgical shoe. He was encouraged to contact the clinic with any evidence of drainage, or change  in appearance to toe/wound. He will be discharged from wound care services Readmission: 07/14/18 on  evaluation today patient presents for initial evaluation our clinic concerning issues that he is having with his left great toe. We previously saw him in 2019 for his right great toe which was actually worse at that time and subsequently was able to be completely healed in the end although it did take several months. With that being said the patient is currently having an area on his left great toe that occurred initially as result of a blister that came up he's not really sure why or how. Subsequently this was initially treated by his podiatrist though the recommendation there was apparently amputation according to the patient and his daughter who was present with him today. With that being said the patient did not want to proceed with amputation and would like to try to perform wound care and get this to heal without having to go down that road. We were able to do that with his other toe and so he would like to at least give that a try before going forward with amputation. Again I completely understand his concern in this regard. With that being said I did discuss with the patient obviously that it's always a chance that we cannot get this to heal with normal wound care measures but obviously we will give it our best shot. He does actually have an appointment scheduled for later today with the vascular specialist in order to evaluate his blood flow. That was at the recommendation of his podiatrist as well. Obviously if he is having good arterial flow and nonetheless is still experiencing issues with having the wound delay in healing then it may simply be a matter of we need to initiate more aggressive wound to therapy in order to see if we can get this to heal. No fevers, chills, nausea, or vomiting noted at this time. The patient notes that this woman has been present for approximately three weeks. He is not a smoker. His most recent hemoglobin A1c was 6.2 on 06/18/19. 07/17/2019 on evaluation today  patient appears unfortunately to not be doing as well. He did see vascular I did review the note as well. That was on 07/15/2019. Subsequently there can actually be taking him to the OR for an angiogram due to poor arterial flow into this extremity. Apparently the atherosclerotic changes were severe. The patient is stated to be at risk for limb loss. Nonetheless the good news is this is being scheduled fairly quickly he will have the surgery/procedure next Thursday. With that being said I am going also go ahead based on the results of his x-ray which showed some chance of osteomyelitis in the distal portion of the toe get the patient set up for an MRI to further evaluate for the possibility of osteomyelitis obviously if he does have osteomyelitis we need to know so that we can address this appropriately. 07/29/2019 upon evaluation today patient appears to be doing better in general with regard to his toe ulcer. He does have much better blood flow you can actually feel a palpable pulse which appears to be very strong at this time. I did review his arterial intervention/angiogram which did show that he had evidence of sufficient arterial flow restoration at this point. He did have occlusions that were 60 and 80% that residually were only 10 and 25% with no limiting flow. This is excellent news and hopefully he will continue to show signs of  improvement in light of the improved vascular status. With that being said the toe mainly shows somewhat of an eschar today I think that this is fairly stable and as such probably would be best not to really do much in the way of debridement at this point I do believe Betadine would likely be a good option. Electronic Signature(s) Signed: 07/29/2019 3:31:23 PM By: Worthy Keeler PA-C Entered By: Worthy Keeler on 07/29/2019 15:31:23 KIYON, FIDALGO ([id number]) -------------------------------------------------------------------------------- Physical Exam  Details Patient Name: Timothy Gibson Date of Service: 07/29/2019 2:45 PM Medical Record Number: [id number] Patient Account Number: [id number] Date of Birth/Sex: 1950-12-28 (69 y.o. M) Treating RN: Cornell Barman Primary Care Provider: Merrie Roof Other Clinician: Referring Provider: Merrie Roof Treating Provider/Extender: Melburn Hake, Solina Heron Weeks in Treatment: 2 Constitutional Well-nourished and well-hydrated in no acute distress. Respiratory normal breathing without difficulty. Psychiatric this patient is able to make decisions and demonstrates good insight into disease process. Alert and Oriented x 3. pleasant and cooperative. Notes She has a fairly stable eschar on the distal portion of his toe and according to the best clinical guidelines the best option right now would be for [place] to just maintain the stability of the eschar allowing this to protect the area from further infection or issues. Obviously if it starts to soften or become unstable we can always remove this and proceed from there. With regard to his pulses they were palpable, 2+, and appeared to be excellent compared to previous where we could not obtain any pulses. Electronic Signature(s) Signed: 07/29/2019 3:31:58 PM By: Worthy Keeler PA-C Entered By: Worthy Keeler on 07/29/2019 15:31:57 Timothy Gibson ([id number]) -------------------------------------------------------------------------------- Physician Orders Details Patient Name: Timothy Gibson Date of Service: 07/29/2019 2:45 PM Medical Record Number: [id number] Patient Account Number: [id number] Date of Birth/Sex: 1951-06-21 (68 y.o. M) Treating RN: Cornell Barman Primary Care Provider: Merrie Roof Other Clinician: Referring Provider: Merrie Roof Treating Provider/Extender: Melburn Hake, Hasson Gaspard Weeks in Treatment: 2 Verbal / Phone Orders: No Diagnosis Coding ICD-10 Coding Code Description [phone] Other chronic osteomyelitis, left ankle and foot E11.621 Type 2  diabetes mellitus with foot ulcer L97.522 Non-pressure chronic ulcer of other part of left foot with fat layer exposed I10 Essential (primary) hypertension Wound Cleansing Wound #3 Left Toe Great o Dial antibacterial soap, wash wounds, rinse and pat dry prior to dressing wounds o May Shower, gently pat wound dry prior to applying new dressing. Anesthetic (add to Medication List) Wound #3 Left Toe Great o Topical Lidocaine 4% cream applied to wound bed prior to debridement (In Clinic Only). Primary Wound Dressing Wound #3 Left Toe Great o Other: - Betadine Secondary Dressing Wound #3 Left Toe Great o Dry Gauze Dressing Change Frequency Wound #3 Left Toe Great o Change dressing every other day. Follow-up Appointments Wound #3 Left Toe Great o Return Appointment in 2 weeks. Off-Loading Wound #3 Left Toe Great o Open toe surgical shoe to: - left foot Consults o Infectious Disease - ID referral to manage antibiotics at Dialysis. Patient Medications HASSEL, UPHOFF ([id number]) Allergies: No Known Drug Allergies Notifications Medication Indication Start End doxycycline hyclate 07/29/2019 DOSE 1 - oral 100 mg capsule - 1 capsule oral taken 2 times a day for 14 days Electronic Signature(s) Signed: 07/29/2019 3:38:01 PM By: Worthy Keeler PA-C Entered By: Worthy Keeler on 07/29/2019 15:37:59 Timothy Gibson ([id number]) -------------------------------------------------------------------------------- Problem List Details Patient Name: Timothy Gibson Date of Service: 07/29/2019 2:45 PM Medical Record Number: [id number]  Patient Account Number: [id number] Date of Birth/Sex: [birth year] (68 y.o. M) Treating RN: Cornell Barman Primary Care Provider: Merrie Roof Other Clinician: Referring Provider: Merrie Roof Treating Provider/Extender: Melburn Hake, Lusine Corlett Weeks in Treatment: 2 Active Problems ICD-10 Evaluated Encounter Code Description Active Date Today  Diagnosis [phone] Other chronic osteomyelitis, left ankle and foot 07/29/2019 No Yes E11.621 Type 2 diabetes mellitus with foot ulcer 07/15/2019 No Yes L97.522 Non-pressure chronic ulcer of other part of left foot with fat 07/15/2019 No Yes layer exposed I10 Essential (primary) hypertension 07/15/2019 No Yes Inactive Problems Resolved Problems Electronic Signature(s) Signed: 07/29/2019 2:41:56 PM By: Worthy Keeler PA-C Entered By: Worthy Keeler on 07/29/2019 14:41:55 Timothy Gibson ([id number]) -------------------------------------------------------------------------------- Progress Note Details Patient Name: Timothy Gibson Date of Service: 07/29/2019 2:45 PM Medical Record Number: [id number] Patient Account Number: [id number] Date of Birth/Sex: [birth year] (68 y.o. M) Treating RN: Cornell Barman Primary Care Provider: Merrie Roof Other Clinician: Referring Provider: Merrie Roof Treating Provider/Extender: Melburn Hake, Yates Weisgerber Weeks in Treatment: 2 Subjective Chief Complaint Information obtained from Patient Left Great Toe History of Present Illness (HPI) 11/27/17 on evaluation today patient presents for initial evaluation concerning an issue that he has been having with his right great toe which began last Thursday 11/22/17. He has a history of diabetes mellitus type to which he has had for greater than 30 years currently he is on insulin. Subsequently he also has a history of hypertension. On physical exam inspection is also appears he may have some peripheral vascular disease with his ABI's being noncompressible bilaterally. He is on dialysis as well and this is on Monday, Wednesday, and Friday. Patient was hospitalized back in January/February 2019 although this was more for stomach/back pain though they never told me exactly what was going on. This is according to the patient. Subsequently the ulcer which is between the third and fourth toe when space of the right foot as well is on the  plantar aspect of the fourth toe is due to him having cleaned his toes this morning and he states that his finger which is large split the toe tissue in between causing the wounds that we currently see. With that being said he states this has happened before I explained I would definitely recommend that he not do this any longer. He can use a small soft washcloth to clean between the toes without causing trauma. Subsequently the right great toe hatchery does show evidence of necrotic tissue present on the surface of the wound specifically there is callous and dead skin surrounding which is trapping fluid causing problems as far as the wound is concerned. The surface of the wound does show slough although due to his vascular flow I'm not going to sharply debride this today I think I will selectively debride away the necrotic skin as well is callous from around the surrounding so this will not continue to be a moisture issue. Nonetheless the patient has no pain he does have diabetic neuropathy. 12/06/17-He is here in follow-up evaluation for a right great toe ulcer. He is voicing no complaints or concerns. He continues to infrequently/socially smokes cigars with no desire to quit. He has been compliant with offloading, using open toed surgical shoe; has been compliant using Santyl daily. He continues on clindamycin although takes it inconsistently secondary to indigestion. The plain film x-ray performed on 5/23 impression: Soft tissue swelling of the left first toe and is noted with probable irregular lucency involving distal tuft of first distal phalanx concerning  for osteomyelitis. MRI may be performed for further evaluation; MRI ordered. The vascular evaluation performed on 5/24 field non-compressible ABIs bilaterally and reduced TBI bilaterally suggesting significant tibial disease. He will be referred to vascular medicine for further evaluation. 12/13/17-He is here in follow-up evaluation for right  great toe ulcer. He has appointment next Thursday for the MRI and vascular evaluation. Wound culture obtained last week grew abundant Klebsiella oxytoca (multidrug sensitivity), and abundant enteric coccus faecalis (sensitive to ampicillin). Ampicillin and Cipro were called in, along with a probiotic. In light of his appointments next Thursday he will follow-up in 2 weeks, we will continue with Santyl 12/27/17-He is here for evaluation for a right great toe ulcer. 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 He did have evaluation by vascular medicine, Dr. Ronalee Belts, on 6/13, at that appointment it was decided for him to undergo angiography with possible intervention but he refused and is still considering. If he chooses to have it done he will contact the office. He has not heard back from either ID offices that he was referred to two weeks ago: [place] and [place]. there is improvement in both appearance and measurement to the ulcer. We will extend antibiotic therapy (amoxicillin 500 every 12, Cipro 500 daily) for additional 2 weeks pending infectious disease consult, he will continue with Santyl daily. He was reminded to continue with probiotic therapy while on oral antibiotics; he denies any GI disturbance. 01/03/18-He is here in follow-up evaluation for right great toe ulcer. He is decided to not undergo any vascular intervention at this time. He was established with [facility] infectious disease (Dr. Linus Salmons) last week initiated on vancomycin and cefazolin with dialysis treatment for 6 weeks. We will continue with current treatment plan with Santyl and offloading and he will follow- up in 2 weeks 01/17/18-He is seen in follow-up evaluation for right great toe ulcer. There is improvement in appearance with less nonviable tissue present. We will continue with same treatment plan he will follow-up next week. He is tolerating IV  antibiotics without DHRUVAN, GULLION. ([id number]) any complications 3/71/06-YI is seen in follow up evaluation for right great toe ulcer. There continues to be improvement. He is tolerating IV antibiotics with hemodialysis, completion date of 7/31. We will transition to collagen and and follow-up next week 02/07/18-He is here in follow up for a right great toe ulcer. There is red granulation tissue throughout the wound, improved in appearance. He completed antibiotics yesterday. He saw podiatry last Thursday for nail trimming, at that appointment he periwound callus was trimmed and a culture was obtained; culture negative. He does not have a follow up appointment with infectious disease. We will submit for grafix. 02/14/18-He is here in follow-up evaluation for a right great toe ulcer. There is slow improvement, red granulation tissue throughout. The insurance approval for grafix is pending. We will continue with collagen and he'll follow-up next week 02/21/18-He is seen in follow-up evaluation for right great toe ulcer. There is improvement with red granulation tissue throughout. He was approved for oasis and this was placed today. He will follow up next week 02/28/18-He is seen in follow-up evaluation for right great toe ulcer. The wound is stable, oasis applied and he'll follow-up next week 03/07/18-He is seen in follow-up violation of her right great toe ulcer. The wound is dry, no evidence of drainage. The wound appears healed today. He was painted with Betadine, instructed to paint with  Betadine daily, cover with band-aid for the next week and then cover with band-aid for the following week continue with surgical shoe. He was encouraged to contact the clinic with any evidence of drainage, or change in appearance to toe/wound. He will be discharged from wound care services Readmission: 07/14/18 on evaluation today patient presents for initial evaluation our clinic concerning issues that he is having  with his left great toe. We previously saw him in 2019 for his right great toe which was actually worse at that time and subsequently was able to be completely healed in the end although it did take several months. With that being said the patient is currently having an area on his left great toe that occurred initially as result of a blister that came up he's not really sure why or how. Subsequently this was initially treated by his podiatrist though the recommendation there was apparently amputation according to the patient and his daughter who was present with him today. With that being said the patient did not want to proceed with amputation and would like to try to perform wound care and get this to heal without having to go down that road. We were able to do that with his other toe and so he would like to at least give that a try before going forward with amputation. Again I completely understand his concern in this regard. With that being said I did discuss with the patient obviously that it's always a chance that we cannot get this to heal with normal wound care measures but obviously we will give it our best shot. He does actually have an appointment scheduled for later today with the vascular specialist in order to evaluate his blood flow. That was at the recommendation of his podiatrist as well. Obviously if he is having good arterial flow and nonetheless is still experiencing issues with having the wound delay in healing then it may simply be a matter of we need to initiate more aggressive wound to therapy in order to see if we can get this to heal. No fevers, chills, nausea, or vomiting noted at this time. The patient notes that this woman has been present for approximately three weeks. He is not a smoker. His most recent hemoglobin A1c was 6.2 on 06/18/19. 07/17/2019 on evaluation today patient appears unfortunately to not be doing as well. He did see vascular I did review the note as  well. That was on 07/15/2019. Subsequently there can actually be taking him to the OR for an angiogram due to poor arterial flow into this extremity. Apparently the atherosclerotic changes were severe. The patient is stated to be at risk for limb loss. Nonetheless the good news is this is being scheduled fairly quickly he will have the surgery/procedure next Thursday. With that being said I am going also go ahead based on the results of his x-ray which showed some chance of osteomyelitis in the distal portion of the toe get the patient set up for an MRI to further evaluate for the possibility of osteomyelitis obviously if he does have osteomyelitis we need to know so that we can address this appropriately. 07/29/2019 upon evaluation today patient appears to be doing better in general with regard to his toe ulcer. He does have much better blood flow you can actually feel a palpable pulse which appears to be very strong at this time. I did review his arterial intervention/angiogram which did show that he had evidence of sufficient arterial flow restoration  at this point. He did have occlusions that were 60 and 80% that residually were only 10 and 25% with no limiting flow. This is excellent news and hopefully he will continue to show signs of improvement in light of the improved vascular status. With that being said the toe mainly shows somewhat of an eschar today I think that this is fairly stable and as such probably would be best not to really do much in the way of debridement at this point I do believe Betadine would likely be a good option. NIKOLAS, CASHER ([id number]) Objective Constitutional Well-nourished and well-hydrated in no acute distress. Vitals Time Taken: 2:37 PM, Height: 76 in, Weight: 244 lbs, BMI: 29.7, Temperature: 98.8 F, Pulse: 84 bpm, Respiratory Rate: 16 breaths/min, Blood Pressure: 142/64 mmHg. Respiratory normal breathing without difficulty. Psychiatric this patient is  able to make decisions and demonstrates good insight into disease process. Alert and Oriented x 3. pleasant and cooperative. General Notes: She has a fairly stable eschar on the distal portion of his toe and according to the best clinical guidelines the best option right now would be for [place] to just maintain the stability of the eschar allowing this to protect the area from further infection or issues. Obviously if it starts to soften or become unstable we can always remove this and proceed from there. With regard to his pulses they were palpable, 2+, and appeared to be excellent compared to previous where we could not obtain any pulses. Integumentary (Hair, Skin) Wound #3 status is Open. Original cause of wound was Gradually Appeared. The wound is located on the Left Toe Great. The wound measures 2.5cm length x 4.3cm width x 0.1cm depth; 8.443cm^2 area and 0.844cm^3 volume. There is Fat Layer (Subcutaneous Tissue) Exposed exposed. There is no tunneling or undermining noted. There is a medium amount of serosanguineous drainage noted. The wound margin is flat and intact. There is small (1-33%) red granulation within the wound bed. There is a large (67-100%) amount of necrotic tissue within the wound bed including Eschar. 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 Anesthetic (add to Medication List): Wound #3 Left Toe Great: Topical Lidocaine 4% cream applied to wound bed prior to debridement (In Clinic Only). ELIAM, SNAPP ([id number]) Primary Wound Dressing: Wound #3 Left Toe Great: Other: -  Betadine Secondary Dressing: Wound #3 Left Toe Great: Dry Gauze Dressing Change Frequency: Wound #3 Left Toe Great: Change dressing every other day. Follow-up Appointments: Wound #3 Left Toe Great: Return Appointment in 2 weeks. Off-Loading: Wound #3 Left Toe Great: Open toe surgical shoe to: - left foot Consults ordered were: Infectious Disease - ID referral to manage antibiotics at Dialysis. The following medication(s) was prescribed: doxycycline hyclate oral 100 mg capsule 1 1 capsule oral taken 2 times a day for 14 days starting 07/29/2019 1. My suggestion at this time is good to be that we go ahead and initiate treatment with Betadine to the distal portion of the toe followed by just a dry gauze dressing in order to keep this clean and dry. Hopefully we can maintain the stability of the eschar allowing this to heal internally. 2. He  does have evidence on MRI of osteomyelitis in the distal phalanx of the toe fortunately there is nothing further into the foot. At this point my suggestion is good to be that we go ahead and make a referral to infectious disease in order to see about IV antibiotics which can be managed for him at dialysis. 3. In the meantime I am to recommend that we continue with the doxycycline orally until he sees infectious disease. It looks like he is close to running out I will go ahead and refill that for him today as well so that he can continue until he sees infectious disease. We will see patient back for reevaluation in 1 week here in the clinic. If anything worsens or changes patient will contact our office for additional recommendations. I did briefly discuss with him as well hyperbaric oxygen therapy which could be considered if things are not improving with the current aggressive wound care measures. We will see how things do over the next several weeks. Electronic Signature(s) Signed: 07/29/2019 3:38:15 PM By: Worthy Keeler PA-C Previous Signature:  07/29/2019 3:33:26 PM Version By: Worthy Keeler PA-C Entered By: Worthy Keeler on 07/29/2019 15:38:15 WISTER, HOEFLE ([id number]) -------------------------------------------------------------------------------- SuperBill Details Patient Name: Timothy Gibson Date of Service: 07/29/2019 Medical Record Number: [id number] Patient Account Number: [id number] Date of Birth/Sex: [birth year] (68 y.o. M) Treating RN: Cornell Barman Primary Care Provider: Merrie Roof Other Clinician: Referring Provider: Merrie Roof Treating Provider/Extender: Melburn Hake, Jaelen Soth Weeks in Treatment: 2 Diagnosis Coding ICD-10 Codes Code Description [id number] Other chronic osteomyelitis, left ankle and foot E11.621 Type 2 diabetes mellitus with foot ulcer L97.522 Non-pressure chronic ulcer of other part of left foot with fat layer exposed I10 Essential (primary) hypertension Facility Procedures CPT4 Code: [id number] Description: 99213 - WOUND CARE VISIT-LEV 3 EST PT Modifier: Quantity: 1 Physician Procedures CPT4 Code: 4401027 Description: 25366 - WC PHYS LEVEL 4 - EST PT ICD-10 Diagnosis Description M86.672 Other chronic osteomyelitis, left ankle and foot E11.621 Type 2 diabetes mellitus with foot ulcer L97.522 Non-pressure chronic ulcer of other part of left foot with fat  I10 Essential (primary) hypertension Modifier: layer exposed Quantity: 1 Electronic Signature(s) Signed: 07/29/2019 3:38:27 PM By: Worthy Keeler PA-C Entered By: Worthy Keeler on 07/29/2019 15:38:26

## 2019-07-30 DIAGNOSIS — Z992 Dependence on renal dialysis: Secondary | ICD-10-CM | POA: Diagnosis not present

## 2019-07-30 DIAGNOSIS — N2581 Secondary hyperparathyroidism of renal origin: Secondary | ICD-10-CM | POA: Diagnosis not present

## 2019-07-30 DIAGNOSIS — D631 Anemia in chronic kidney disease: Secondary | ICD-10-CM | POA: Diagnosis not present

## 2019-07-30 DIAGNOSIS — N186 End stage renal disease: Secondary | ICD-10-CM | POA: Diagnosis not present

## 2019-07-30 DIAGNOSIS — D509 Iron deficiency anemia, unspecified: Secondary | ICD-10-CM | POA: Diagnosis not present

## 2019-07-30 NOTE — Progress Notes (Signed)
[name], [name] ([id number]) Visit Report for 07/29/2019 Arrival Information Details Patient Name: Timothy Gibson, Timothy Gibson Date of Service: 07/29/2019 2:45 PM Medical Record Number: [id number] Patient Account Number: [id number] Date of Birth/Sex: [birth year] (69 y.o. M) Treating RN: Cornell Barman Primary Care Katalia Choma: Merrie Roof Other Clinician: Referring Arali Somera: Merrie Roof Treating Bralyn Folkert/Extender: Melburn Hake, [name] Weeks in Treatment: 2 Visit Information History Since Last Visit Added or deleted any medications: Yes Patient Arrived: Ambulatory Any new allergies or adverse reactions: No Arrival Time: 14:28 Had a fall or experienced change in No Accompanied By: caregiver activities of daily living that may affect Transfer Assistance: None risk of falls: Patient Identification Verified: Yes Signs or symptoms of abuse/neglect since last visito No Secondary Verification Process Completed: Yes Hospitalized since last visit: No Implantable device outside of the clinic excluding No cellular tissue based products placed in the center since last visit: Has Dressing in Place as Prescribed: Yes Pain Present Now: No Electronic Signature(s) Signed: 07/29/2019 3:52:50 PM By: Lorine Bears RCP, RRT, CHT Entered By: Lorine Bears on 07/29/2019 14:36:50 Timothy Gibson ([id number]) -------------------------------------------------------------------------------- Clinic Level of Care Assessment Details Patient Name: Timothy Gibson Date of Service: 07/29/2019 2:45 PM Medical Record Number: [id number] Patient Account Number: [id number] Date of Birth/Sex: 11/08/1950 (69 y.o. M) Treating RN: Cornell Barman Primary Care Leveda Kendrix: Merrie Roof Other Clinician: Referring Ladawna Walgren: Merrie Roof Treating Clydell Alberts/Extender: Melburn Hake, [name] Weeks in Treatment: 2 Clinic Level of Care Assessment Items TOOL 4 Quantity Score []  - Use when only an EandM is performed on FOLLOW-UP  visit 0 ASSESSMENTS - Nursing Assessment / Reassessment X - Reassessment of Co-morbidities (includes updates in patient status) 1 10 X- 1 5 Reassessment of Adherence to Treatment Plan ASSESSMENTS - Wound and Skin Assessment / Reassessment X - Simple Wound Assessment / Reassessment - one wound 1 5 []  - 0 Complex Wound Assessment / Reassessment - multiple wounds []  - 0 Dermatologic / Skin Assessment (not related to wound area) ASSESSMENTS - Focused Assessment []  - Circumferential Edema Measurements - multi extremities 0 []  - 0 Nutritional Assessment / Counseling / Intervention []  - 0 Lower Extremity Assessment (monofilament, tuning fork, pulses) []  - 0 Peripheral Arterial Disease Assessment (using hand held doppler) ASSESSMENTS - Ostomy and/or Continence Assessment and Care []  - Incontinence Assessment and Management 0 []  - 0 Ostomy Care Assessment and Management (repouching, etc.) PROCESS - Coordination of Care X - Simple Patient / Family Education for ongoing care 1 15 []  - 0 Complex (extensive) Patient / Family Education for ongoing care X- 1 10 Staff obtains Programmer, systems, Records, Test Results / Process Orders []  - 0 Staff telephones HHA, Nursing Homes / Clarify orders / etc []  - 0 Routine Transfer to another Facility (non-emergent condition) []  - 0 Routine Hospital Admission (non-emergent condition) []  - 0 New Admissions / Biomedical engineer / Ordering NPWT, Apligraf, etc. []  - 0 Emergency Hospital Admission (emergent condition) X- 1 10 Simple Discharge Coordination Timothy Gibson, Timothy Gibson ([id number]) []  - 0 Complex (extensive) Discharge Coordination PROCESS - Special Needs []  - Pediatric / Minor Patient Management 0 []  - 0 Isolation Patient Management []  - 0 Hearing / Language / Visual special needs []  - 0 Assessment of Community assistance (transportation, D/C planning, etc.) []  - 0 Additional assistance / Altered mentation []  - 0 Support Surface(s) Assessment  (bed, cushion, seat, etc.) INTERVENTIONS - Wound Cleansing / Measurement X - Simple Wound Cleansing - one wound 1 5 []  - 0 Complex Wound Cleansing - multiple wounds  X- 1 5 Wound Imaging (photographs - any number of wounds) []  - 0 Wound Tracing (instead of photographs) X- 1 5 Simple Wound Measurement - one wound []  - 0 Complex Wound Measurement - multiple wounds INTERVENTIONS - Wound Dressings X - Small Wound Dressing one or multiple wounds 1 10 []  - 0 Medium Wound Dressing one or multiple wounds []  - 0 Large Wound Dressing one or multiple wounds []  - 0 Application of Medications - topical []  - 0 Application of Medications - injection INTERVENTIONS - Miscellaneous []  - External ear exam 0 []  - 0 Specimen Collection (cultures, biopsies, blood, body fluids, etc.) []  - 0 Specimen(s) / Culture(s) sent or taken to Lab for analysis []  - 0 Patient Transfer (multiple staff / Civil Service fast streamer / Similar devices) []  - 0 Simple Staple / Suture removal (25 or less) []  - 0 Complex Staple / Suture removal (26 or more) []  - 0 Hypo / Hyperglycemic Management (close monitor of Blood Glucose) []  - 0 Ankle / Brachial Index (ABI) - do not check if billed separately X- 1 5 Vital Signs Timothy Gibson, Timothy Gibson ([id number]) Has the patient been seen at the hospital within the last three years: Yes Total Score: 85 Level Of Care: New/Established - Level 3 Electronic Signature(s) Signed: 07/30/2019 3:29:52 PM By: Gretta Cool, BSN, RN, CWS, Kim RN, BSN Entered By: Gretta Cool, BSN, RN, CWS, Kim on 07/29/2019 15:00:37 Timothy Gibson ([id number]) -------------------------------------------------------------------------------- Encounter Discharge Information Details Patient Name: Timothy Gibson Date of Service: 07/29/2019 2:45 PM Medical Record Number: [id number] Patient Account Number: [id number] Date of Birth/Sex: Mar 26, 1951 (69 y.o. M) Treating RN: Cornell Barman Primary Care Rochanda Harpham: Merrie Roof Other  Clinician: Referring Jaquila Santelli: Merrie Roof Treating Erika Hussar/Extender: Sharalyn Ink in Treatment: 2 Encounter Discharge Information Items Discharge Condition: Stable Ambulatory Status: Ambulatory Discharge Destination: Home Transportation: Private Auto Accompanied By: self Schedule Follow-up Appointment: Yes Clinical Summary of Care: Electronic Signature(s) Signed: 07/30/2019 3:29:52 PM By: Gretta Cool, BSN, RN, CWS, Kim RN, BSN Entered By: Gretta Cool, BSN, RN, CWS, Kim on 07/29/2019 15:02:20 Timothy Gibson ([id number]) -------------------------------------------------------------------------------- Lower Extremity Assessment Details Patient Name: Timothy Gibson Date of Service: 07/29/2019 2:45 PM Medical Record Number: [id number] Patient Account Number: [id number] Date of Birth/Sex: [birth year] (68 y.o. M) Treating RN: Cornell Barman Primary Care Dallas Scorsone: Merrie Roof Other Clinician: Referring Lloyde Ludlam: Merrie Roof Treating Teasia Zapf/Extender: Melburn Hake, [name] Weeks in Treatment: 2 Edema Assessment Assessed: [Left: Yes] [Right: No] Edema: [Left: N] [Right: o] Vascular Assessment Pulses: Dorsalis Pedis Palpable: [Left:Yes] Electronic Signature(s) Signed: 07/30/2019 3:29:52 PM By: Gretta Cool, BSN, RN, CWS, Kim RN, BSN Entered By: Gretta Cool, BSN, RN, CWS, Kim on 07/29/2019 14:43:03 Timothy Gibson, Timothy Gibson ([id number]) -------------------------------------------------------------------------------- Multi Wound Chart Details Patient Name: Timothy Gibson Date of Service: 07/29/2019 2:45 PM Medical Record Number: [id number] Patient Account Number: [id number] Date of Birth/Sex: [birth year] (68 y.o. M) Treating RN: Cornell Barman Primary Care Kazi Reppond: Merrie Roof Other Clinician: Referring Tonatiuh Mallon: Merrie Roof Treating Niasia Lanphear/Extender: Melburn Hake, [name] Weeks in Treatment: 2 Vital Signs Height(in): 76 Pulse(bpm): 84 Weight(lbs): 244 Blood Pressure(mmHg): 142/64 Body Mass Index(BMI):  30 Temperature(F): 98.8 Respiratory Rate 16 (breaths/min): Photos: [N/A:N/A] Wound Location: Left Toe Great N/A N/A Wounding Event: Gradually Appeared N/A N/A Primary Etiology: Diabetic Wound/Ulcer of the N/A N/A Lower Extremity Comorbid History: Lymphedema, Arrhythmia, N/A N/A Hypertension, Type II Diabetes, End Stage Renal Disease, Rheumatoid Arthritis, Osteoarthritis, Confinement Anxiety Date Acquired: 06/19/2019 N/A N/A Weeks of Treatment: 2 N/A N/A Timothy Gibson, Timothy Gibson ([id number]) Wound Status: Open N/A N/A Measurements L  x W x D 2.5x4.3x0.1 N/A N/A (cm) Area (cm) : 8.443 N/A N/A Volume (cm) : 0.844 N/A N/A % Reduction in Area: -211.50% N/A N/A % Reduction in Volume: -211.40% N/A N/A Classification: Grade 2 N/A N/A Exudate Amount: Medium N/A N/A Exudate Type: Serosanguineous N/A N/A Exudate Color: red, brown N/A N/A Wound Margin: Flat and Intact N/A N/A Granulation Amount: Small (1-33%) N/A N/A Granulation Quality: Red N/A N/A Necrotic Amount: Large (67-100%) N/A N/A Necrotic Tissue: Eschar N/A N/A Exposed Structures: Fat Layer (Subcutaneous N/A N/A Tissue) Exposed: Yes Fascia: No Tendon: No Muscle: No Joint: No Bone: No Epithelialization: None N/A N/A Treatment Notes Electronic Signature(s) Signed: 07/30/2019 3:29:52 PM By: Gretta Cool, BSN, RN, CWS, Kim RN, BSN Entered By: Gretta Cool, BSN, RN, CWS, Kim on 07/29/2019 14:43:15 Timothy Gibson ([id number]) -------------------------------------------------------------------------------- [place] Details Patient Name: Timothy Gibson Date of Service: 07/29/2019 2:45 PM Medical Record Number: [id number] Patient Account Number: [id number] Date of Birth/Sex: 1951/05/08 (68 y.o. M) Treating RN: Cornell Barman Primary Care Mertie Haslem: Merrie Roof Other Clinician: Referring Raynell Scott: Merrie Roof Treating Sharell Hilmer/Extender: Melburn Hake, [name] Weeks in Treatment: 2 Active Inactive Abuse / Safety / Falls /  Self Care Management Nursing Diagnoses: Potential for falls Goals: Patient will not experience any injury related to falls Date Initiated: 07/15/2019 Target Resolution Date: 10/18/2019 Goal Status: Active Interventions: Assess fall risk on admission and as needed Notes: Necrotic Tissue Nursing Diagnoses: Impaired tissue integrity related to necrotic/devitalized tissue Goals: Necrotic/devitalized tissue will be minimized in the wound bed Date Initiated: 07/15/2019 Target Resolution Date: 10/18/2019 Goal Status: Active Interventions: Provide education on necrotic tissue and debridement process Notes: Orientation to the Wound Care Program Nursing Diagnoses: Knowledge deficit related to the wound healing center program Goals: Patient/caregiver will verbalize understanding of the [place] Program Date Initiated: 07/15/2019 Target Resolution Date: 10/18/2019 Goal Status: Active Interventions: Provide education on orientation to the wound center Timothy Gibson, Timothy Gibson ([id number]) Notes: Peripheral Neuropathy Nursing Diagnoses: Knowledge deficit related to disease process and management of peripheral neurovascular dysfunction Goals: Patient/caregiver will verbalize understanding of disease process and disease management Date Initiated: 07/15/2019 Target Resolution Date: 10/18/2019 Goal Status: Active Interventions: Assess signs and symptoms of neuropathy upon admission and as needed Provide education on Management of Neuropathy and Related Ulcers Notes: Wound/Skin Impairment Nursing Diagnoses: Impaired tissue integrity Goals: Ulcer/skin breakdown will heal within 14 weeks Date Initiated: 07/15/2019 Target Resolution Date: 10/18/2019 Goal Status: Active Interventions: Assess patient/caregiver ability to obtain necessary supplies Assess patient/caregiver ability to perform ulcer/skin care regimen upon admission and as needed Assess ulceration(s) every visit Notes: Electronic  Signature(s) Signed: 07/30/2019 3:29:52 PM By: Gretta Cool, BSN, RN, CWS, Kim RN, BSN Entered By: Gretta Cool, BSN, RN, CWS, Kim on 07/29/2019 14:43:07 Timothy Gibson ([id number]) -------------------------------------------------------------------------------- Pain Assessment Details Patient Name: Timothy Gibson Date of Service: 07/29/2019 2:45 PM Medical Record Number: [id number] Patient Account Number: [id number] Date of Birth/Sex: Oct 25, 1950 (68 y.o. M) Treating RN: Cornell Barman Primary Care Gerasimos Plotts: Merrie Roof Other Clinician: Referring Aayla Marrocco: Merrie Roof Treating Jajaira Ruis/Extender: Melburn Hake, [name] Weeks in Treatment: 2 Active Problems Location of Pain Severity and Description of Pain Patient Has Paino No Site Locations Pain Management and Medication Current Pain Management: Electronic Signature(s) Signed: 07/29/2019 3:52:50 PM By: Lorine Bears RCP, RRT, CHT Signed: 07/30/2019 3:29:52 PM By: Gretta Cool, BSN, RN, CWS, Kim RN, BSN Entered By: Lorine Bears on 07/29/2019 14:38:06 Timothy Gibson ([id number]) -------------------------------------------------------------------------------- Patient/Caregiver Education Details Patient Name: Timothy Gibson Date of Service: 07/29/2019 2:45  PM Medical Record Number: [id number] Patient Account Number: [id number] Date of Birth/Gender: [birth year] (69 y.o. M) Treating RN: Cornell Barman Primary Care Physician: Merrie Roof Other Clinician: Referring Physician: Merrie Roof Treating Physician/Extender: Sharalyn Ink in Treatment: 2 Education Assessment Education Provided To: Patient Education Topics Provided Offloading: Handouts: What is Offloadingo, Other: offloading shoe left Methods: Demonstration, Explain/Verbal Responses: State content correctly Wound/Skin Impairment: Handouts: Caring for Your Ulcer, Other: continue wound care as prescribed Methods: Demonstration, Explain/Verbal Responses: State  content correctly Electronic Signature(s) Signed: 07/30/2019 3:29:52 PM By: Gretta Cool, BSN, RN, CWS, Kim RN, BSN Entered By: Gretta Cool, BSN, RN, CWS, Kim on 07/29/2019 15:01:29 Timothy Gibson, Timothy Gibson ([id number]) -------------------------------------------------------------------------------- Wound Assessment Details Patient Name: Timothy Gibson Date of Service: 07/29/2019 2:45 PM Medical Record Number: [id number] Patient Account Number: [id number] Date of Birth/Sex: Nov 25, 1950 (68 y.o. M) Treating RN: Cornell Barman Primary Care Cheyne Boulden: Merrie Roof Other Clinician: Referring Deira Shimer: Merrie Roof Treating Jasma Seevers/Extender: Melburn Hake, [name] Weeks in Treatment: 2 Wound Status Wound Number: 3 Primary Diabetic Wound/Ulcer of the Lower Extremity Etiology: Wound Location: Left Toe Great Wound Open Wounding Event: Gradually Appeared Status: Date Acquired: 06/19/2019 Comorbid Lymphedema, Arrhythmia, Hypertension, Type II Weeks Of Treatment: 2 History: Diabetes, End Stage Renal Disease, Clustered Wound: No Rheumatoid Arthritis, Osteoarthritis, Confinement Anxiety Photos Wound Measurements Length: (cm) 2.5 % Reduction in Width: (cm) 4.3 % Reduction in Depth: (cm) 0.1 Epithelializati Area: (cm) 8.443 Tunneling: Volume: (cm) 0.844 Undermining: Area: -211.5% Volume: -211.4% on: None No No Wound Description Classification: Grade 2 Foul Odor After Wound Margin: Flat and Intact Slough/Fibrino Exudate Amount: Medium Exudate Type: Serosanguineous Exudate Color: red, brown Cleansing: No No Wound Bed Granulation Amount: Small (1-33%) Exposed Structure Granulation Quality: Red Fascia Exposed: No Necrotic Amount: Large (67-100%) Fat Layer (Subcutaneous Tissue) Exposed: Yes Necrotic Quality: Eschar Tendon Exposed: No Muscle Exposed: No Joint Exposed: No Bone Exposed: No Timothy Gibson, Timothy Gibson ([id number]) Treatment Notes Wound #3 (Left Toe Great) Notes betadine and gauze, offloading  shoe Electronic Signature(s) Signed: 07/30/2019 3:29:52 PM By: Gretta Cool, BSN, RN, CWS, Kim RN, BSN Entered By: Gretta Cool, BSN, RN, CWS, Kim on 07/29/2019 14:41:25 Timothy Gibson ([id number]) -------------------------------------------------------------------------------- [place] Details Patient Name: Timothy Gibson Date of Service: 07/29/2019 2:45 PM Medical Record Number: [id number] Patient Account Number: [id number] Date of Birth/Sex: 06-03-1951 (69 y.o. M) Treating RN: Cornell Barman Primary Care Meron Bocchino: Merrie Roof Other Clinician: Referring Ellanore Vanhook: Merrie Roof Treating Sheri Gatchel/Extender: Melburn Hake, [name] Weeks in Treatment: 2 Vital Signs Time Taken: 14:37 Temperature (F): 98.8 Height (in): 76 Pulse (bpm): 84 Weight (lbs): 244 Respiratory Rate (breaths/min): 16 Body Mass Index (BMI): 29.7 Blood Pressure (mmHg): 142/64 Reference Range: 80 - 120 mg / dl Electronic Signature(s) Signed: 07/29/2019 3:52:50 PM By: Lorine Bears RCP, RRT, CHT Entered By: Lorine Bears on 07/29/2019 14:39:34

## 2019-08-02 DIAGNOSIS — N186 End stage renal disease: Secondary | ICD-10-CM | POA: Diagnosis not present

## 2019-08-02 DIAGNOSIS — D631 Anemia in chronic kidney disease: Secondary | ICD-10-CM | POA: Diagnosis not present

## 2019-08-02 DIAGNOSIS — N2581 Secondary hyperparathyroidism of renal origin: Secondary | ICD-10-CM | POA: Diagnosis not present

## 2019-08-02 DIAGNOSIS — Z992 Dependence on renal dialysis: Secondary | ICD-10-CM | POA: Diagnosis not present

## 2019-08-02 DIAGNOSIS — D509 Iron deficiency anemia, unspecified: Secondary | ICD-10-CM | POA: Diagnosis not present

## 2019-08-04 DIAGNOSIS — D631 Anemia in chronic kidney disease: Secondary | ICD-10-CM | POA: Diagnosis not present

## 2019-08-04 DIAGNOSIS — Z992 Dependence on renal dialysis: Secondary | ICD-10-CM | POA: Diagnosis not present

## 2019-08-04 DIAGNOSIS — N2581 Secondary hyperparathyroidism of renal origin: Secondary | ICD-10-CM | POA: Diagnosis not present

## 2019-08-04 DIAGNOSIS — N186 End stage renal disease: Secondary | ICD-10-CM | POA: Diagnosis not present

## 2019-08-04 DIAGNOSIS — D509 Iron deficiency anemia, unspecified: Secondary | ICD-10-CM | POA: Diagnosis not present

## 2019-08-06 DIAGNOSIS — N186 End stage renal disease: Secondary | ICD-10-CM | POA: Diagnosis not present

## 2019-08-06 DIAGNOSIS — D509 Iron deficiency anemia, unspecified: Secondary | ICD-10-CM | POA: Diagnosis not present

## 2019-08-06 DIAGNOSIS — Z992 Dependence on renal dialysis: Secondary | ICD-10-CM | POA: Diagnosis not present

## 2019-08-06 DIAGNOSIS — N2581 Secondary hyperparathyroidism of renal origin: Secondary | ICD-10-CM | POA: Diagnosis not present

## 2019-08-06 DIAGNOSIS — D631 Anemia in chronic kidney disease: Secondary | ICD-10-CM | POA: Diagnosis not present

## 2019-08-07 ENCOUNTER — Other Ambulatory Visit: Payer: Self-pay

## 2019-08-07 ENCOUNTER — Encounter: Payer: Medicare Other | Admitting: Physician Assistant

## 2019-08-07 DIAGNOSIS — E11621 Type 2 diabetes mellitus with foot ulcer: Secondary | ICD-10-CM | POA: Diagnosis not present

## 2019-08-07 DIAGNOSIS — N186 End stage renal disease: Secondary | ICD-10-CM | POA: Diagnosis not present

## 2019-08-07 DIAGNOSIS — E1122 Type 2 diabetes mellitus with diabetic chronic kidney disease: Secondary | ICD-10-CM | POA: Diagnosis not present

## 2019-08-07 DIAGNOSIS — M069 Rheumatoid arthritis, unspecified: Secondary | ICD-10-CM | POA: Diagnosis not present

## 2019-08-07 DIAGNOSIS — L97522 Non-pressure chronic ulcer of other part of left foot with fat layer exposed: Secondary | ICD-10-CM | POA: Diagnosis not present

## 2019-08-07 DIAGNOSIS — I120 Hypertensive chronic kidney disease with stage 5 chronic kidney disease or end stage renal disease: Secondary | ICD-10-CM | POA: Diagnosis not present

## 2019-08-07 NOTE — Progress Notes (Addendum)
Timothy Gibson, Timothy Gibson ([id number]) Visit Report for 08/07/2019 Chief Complaint Document Details Patient Name: Timothy Gibson, Timothy Gibson Date of Service: 08/07/2019 8:45 AM Medical Record Number: [id number] Patient Account Number: [id number] Date of Birth/Sex: 06/21/1951 (69 y.o. M) Treating RN: Army Melia Primary Care Provider: Merrie Roof Other Clinician: Referring Provider: Merrie Roof Treating Provider/Extender: Melburn Hake, Miking Usrey Weeks in Treatment: 3 Information Obtained from: Patient Chief Complaint Left Great Toe Electronic Signature(s) Signed: 08/07/2019 9:09:53 AM By: Worthy Keeler PA-C Entered By: Worthy Keeler on 08/07/2019 09:09:53 Timothy Gibson ([id number]) -------------------------------------------------------------------------------- Debridement Details Patient Name: Timothy Gibson Date of Service: 08/07/2019 8:45 AM Medical Record Number: [id number] Patient Account Number: [id number] Date of Birth/Sex: May 13, 1951 (68 y.o. M) Treating RN: Army Melia Primary Care Provider: Merrie Roof Other Clinician: Referring Provider: Merrie Roof Treating Provider/Extender: Melburn Hake, Shevawn Langenberg Weeks in Treatment: 3 Debridement Performed for Wound #3 Left Toe Great Assessment: Performed By: Physician [name], Janiene Aarons E., PA-C Debridement Type: Debridement Severity of Tissue Pre Fat layer exposed Debridement: Level of Consciousness (Pre- Awake and Alert procedure): Pre-procedure Verification/Time Yes - 09:18 Out Taken: Start Time: 09:18 Pain Control: Lidocaine Total Area Debrided (L x W): 2.5 (cm) x 3 (cm) = 7.5 (cm) Tissue and other material Viable, Non-Viable, Callus, Slough, Subcutaneous, Slough debrided: Level: Skin/Subcutaneous Tissue Debridement Description: Excisional Instrument: Curette Bleeding: Minimum Hemostasis Achieved: Pressure End Time: 09:25 Response to Treatment: Procedure was tolerated well Level of Consciousness Awake and  Alert (Post-procedure): Post Debridement Measurements of Total Wound Length: (cm) 2.5 Width: (cm) 3 Depth: (cm) 0.1 Volume: (cm) 0.589 Character of Wound/Ulcer Post Debridement: Stable Severity of Tissue Post Debridement: Fat layer exposed Post Procedure Diagnosis Same as Pre-procedure Electronic Signature(s) Signed: 08/07/2019 11:58:35 AM By: Army Melia Signed: 08/07/2019 5:03:37 PM By: Worthy Keeler PA-C Entered By: Army Melia on 08/07/2019 09:23:46 Timothy Gibson ([id number]) -------------------------------------------------------------------------------- HPI Details Patient Name: Timothy Gibson Date of Service: 08/07/2019 8:45 AM Medical Record Number: [id number] Patient Account Number: [id number] Date of Birth/Sex: Jul 30, 1950 (69 y.o. M) Treating RN: Army Melia Primary Care Provider: Merrie Roof Other Clinician: Referring Provider: Merrie Roof Treating Provider/Extender: Melburn Hake, Melida Northington Weeks in Treatment: 3 History of Present Illness HPI Description: 11/27/17 on evaluation today patient presents for initial evaluation concerning an issue that he has been having with his right great toe which began last Thursday 11/22/17. He has a history of diabetes mellitus type to which he has had for greater than 30 years currently he is on insulin. Subsequently he also has a history of hypertension. On physical exam inspection is also appears he may have some peripheral vascular disease with his ABI's being noncompressible bilaterally. He is on dialysis as well and this is on Monday, Wednesday, and Friday. Patient was hospitalized back in January/February 2019 although this was more for stomach/back pain though they never told me exactly what was going on. This is according to the patient. Subsequently the ulcer which is between the third and fourth toe when space of the right foot as well is on the plantar aspect of the fourth toe is due to him having cleaned his toes this  morning and he states that his finger which is large split the toe tissue in between causing the wounds that we currently see. With that being said he states this has happened before I explained I would definitely recommend that he not do this any longer. He can use a small soft washcloth to clean between the toes without causing trauma. Subsequently  the right great toe hatchery does show evidence of necrotic tissue present on the surface of the wound specifically there is callous and dead skin surrounding which is trapping fluid causing problems as far as the wound is concerned. The surface of the wound does show slough although due to his vascular flow I'm not going to sharply debride this today I think I will selectively debride away the necrotic skin as well is callous from around the surrounding so this will not continue to be a moisture issue. Nonetheless the patient has no pain he does have diabetic neuropathy. 12/06/17-He is here in follow-up evaluation for a right great toe ulcer. He is voicing no complaints or concerns. He continues to infrequently/socially smokes cigars with no desire to quit. He has been compliant with offloading, using open toed surgical shoe; has been compliant using Santyl daily. He continues on clindamycin although takes it inconsistently secondary to indigestion. The plain film x-ray performed on 5/23 impression: Soft tissue swelling of the left first toe and is noted with probable irregular lucency involving distal tuft of first distal phalanx concerning for osteomyelitis. MRI may be performed for further evaluation; MRI ordered. The vascular evaluation performed on 5/24 field non-compressible ABIs bilaterally and reduced TBI bilaterally suggesting significant tibial disease. He will be referred to vascular medicine for further evaluation. 12/13/17-He is here in follow-up evaluation for right great toe ulcer. He has appointment next Thursday for the MRI and  vascular evaluation. Wound culture obtained last week grew abundant Klebsiella oxytoca (multidrug sensitivity), and abundant enteric coccus faecalis (sensitive to ampicillin). Ampicillin and Cipro were called in, along with a probiotic. In light of his appointments next Thursday he will follow-up in 2 weeks, we will continue with Santyl 12/27/17-He is here for evaluation for a right great toe ulcer. 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 He did have evaluation by vascular medicine, Dr. Ronalee Belts, on 6/13, at that appointment it was decided for him to undergo angiography with possible intervention but he refused and is still considering. If he chooses to have it done he will contact the office. He has not heard back from either ID offices that he was referred to two weeks ago: [place] and [place]. there is improvement in both appearance and measurement to the ulcer. We will extend antibiotic therapy (amoxicillin 500 every 12, Cipro 500 daily) for additional 2 weeks pending infectious disease consult, he will continue with Santyl daily. He was reminded to continue with probiotic therapy while on oral antibiotics; he denies any GI disturbance. 01/03/18-He is here in follow-up evaluation for right great toe ulcer. He is decided to not undergo any vascular intervention at this time. He was established with [facility] infectious disease (Dr. Linus Salmons) last week initiated on vancomycin and cefazolin with dialysis treatment for 6 weeks. We will continue with current treatment plan with Santyl and offloading and he will follow- up in 2 weeks 01/17/18-He is seen in follow-up evaluation for right great toe ulcer. There is improvement in appearance with less nonviable tissue present. We will continue with same treatment plan he will follow-up next week. He is tolerating IV antibiotics without any complications 10/24/58-YT is seen in  follow up evaluation for right great toe ulcer. There continues to be improvement. He is tolerating IV antibiotics with hemodialysis, completion date of 7/31. We will transition to collagen and and follow-up next week 02/07/18-He is here in follow up for a right  great toe ulcer. There is red granulation tissue throughout the wound, improved in appearance. He completed antibiotics yesterday. He saw podiatry last Thursday for nail trimming, at that appointment he periwound callus was trimmed and a culture was obtained; culture negative. He does not have a follow up appointment with BILL, YOHN. ([id number]) infectious disease. We will submit for grafix. 02/14/18-He is here in follow-up evaluation for a right great toe ulcer. There is slow improvement, red granulation tissue throughout. The insurance approval for grafix is pending. We will continue with collagen and he'll follow-up next week 02/21/18-He is seen in follow-up evaluation for right great toe ulcer. There is improvement with red granulation tissue throughout. He was approved for oasis and this was placed today. He will follow up next week 02/28/18-He is seen in follow-up evaluation for right great toe ulcer. The wound is stable, oasis applied and he'll follow-up next week 03/07/18-He is seen in follow-up violation of her right great toe ulcer. The wound is dry, no evidence of drainage. The wound appears healed today. He was painted with Betadine, instructed to paint with Betadine daily, cover with band-aid for the next week and then cover with band-aid for the following week continue with surgical shoe. He was encouraged to contact the clinic with any evidence of drainage, or change in appearance to toe/wound. He will be discharged from wound care services Readmission: 07/14/18 on evaluation today patient presents for initial evaluation our clinic concerning issues that he is having with his left great toe. We previously saw him in 2019 for  his right great toe which was actually worse at that time and subsequently was able to be completely healed in the end although it did take several months. With that being said the patient is currently having an area on his left great toe that occurred initially as result of a blister that came up he's not really sure why or how. Subsequently this was initially treated by his podiatrist though the recommendation there was apparently amputation according to the patient and his daughter who was present with him today. With that being said the patient did not want to proceed with amputation and would like to try to perform wound care and get this to heal without having to go down that road. We were able to do that with his other toe and so he would like to at least give that a try before going forward with amputation. Again I completely understand his concern in this regard. With that being said I did discuss with the patient obviously that it's always a chance that we cannot get this to heal with normal wound care measures but obviously we will give it our best shot. He does actually have an appointment scheduled for later today with the vascular specialist in order to evaluate his blood flow. That was at the recommendation of his podiatrist as well. Obviously if he is having good arterial flow and nonetheless is still experiencing issues with having the wound delay in healing then it may simply be a matter of we need to initiate more aggressive wound to therapy in order to see if we can get this to heal. No fevers, chills, nausea, or vomiting noted at this time. The patient notes that this woman has been present for approximately three weeks. He is not a smoker. His most recent hemoglobin A1c was 6.2 on 06/18/19. 07/17/2019 on evaluation today patient appears unfortunately to not be doing as well. He did see vascular I  did review the note as well. That was on 07/15/2019. Subsequently there can actually be  taking him to the OR for an angiogram due to poor arterial flow into this extremity. Apparently the atherosclerotic changes were severe. The patient is stated to be at risk for limb loss. Nonetheless the good news is this is being scheduled fairly quickly he will have the surgery/procedure next Thursday. With that being said I am going also go ahead based on the results of his x-ray which showed some chance of osteomyelitis in the distal portion of the toe get the patient set up for an MRI to further evaluate for the possibility of osteomyelitis obviously if he does have osteomyelitis we need to know so that we can address this appropriately. 07/29/2019 upon evaluation today patient appears to be doing better in general with regard to his toe ulcer. He does have much better blood flow you can actually feel a palpable pulse which appears to be very strong at this time. I did review his arterial intervention/angiogram which did show that he had evidence of sufficient arterial flow restoration at this point. He did have occlusions that were 60 and 80% that residually were only 10 and 25% with no limiting flow. This is excellent news and hopefully he will continue to show signs of improvement in light of the improved vascular status. With that being said the toe mainly shows somewhat of an eschar today I think that this is fairly stable and as such probably would be best not to really do much in the way of debridement at this point I do believe Betadine would likely be a good option. 08/07/2019 on evaluation today patient actually appears to be doing about the same. The one difference is the eschar that we were just seeing if we can maintain is actually starting to loosen up and leak around the edges I think it is time to actually go ahead and remove this today. Is also no longer stable is very soft. Nonetheless the patient is okay with proceeding with debridement at this point. Fortunately there is no  signs of active infection. No fevers, chills, nausea, vomiting, or diarrhea. The patient states that he took his last antibiotic that he had last night. He would need a refill as he will not be seeing infectious disease until February 2. Nonetheless obviously I do not want him to have any break in therapy especially when he is doing as well as he is currently. Electronic Signature(s) Signed: 08/07/2019 9:37:16 AM By: Worthy Keeler PA-C Entered By: Worthy Keeler on 08/07/2019 09:37:16 Timothy Gibson, Timothy Gibson ([id number]) Timothy Gibson, Timothy Gibson ([id number]) -------------------------------------------------------------------------------- Physical Exam Details Patient Name: Timothy Gibson Date of Service: 08/07/2019 8:45 AM Medical Record Number: [id number] Patient Account Number: [id number] Date of Birth/Sex: 03-17-1951 (68 y.o. M) Treating RN: Army Melia Primary Care Provider: Merrie Roof Other Clinician: Referring Provider: Merrie Roof Treating Provider/Extender: [name], Ramya Vanbergen Weeks in Treatment: 3 Constitutional Well-nourished and well-hydrated in no acute distress. Respiratory normal breathing without difficulty. Psychiatric this patient is able to make decisions and demonstrates good insight into disease process. Alert and Oriented x 3. pleasant and cooperative. Notes Patient's wound bed currently did have eschar noted a lot of dry skin around the toe as well. This did require fairly aggressive sharp debridement today to remove both the eschar as well as the dry. Over the eschar region especially/callus skin around the edges of the wound. I did perform the sharp debridement without any complication  or severe bleeding towards the nailbed the patient actually was able to tolerate with no pain although he did have a little bit of bleeding right at the nailbed more proximal. I did therefore use a little bit of silver nitrate to complete cauterize this area to prevent any bleeding when he  gets home he had no issues with that it was just a very small area. Electronic Signature(s) Signed: 08/07/2019 9:38:10 AM By: Worthy Keeler PA-C Entered By: Worthy Keeler on 08/07/2019 09:38:10 Timothy Gibson ([id number]) -------------------------------------------------------------------------------- Physician Orders Details Patient Name: Timothy Gibson Date of Service: 08/07/2019 8:45 AM Medical Record Number: [id number] Patient Account Number: [id number] Date of Birth/Sex: [birth year] (68 y.o. M) Treating RN: Army Melia Primary Care Provider: Merrie Roof Other Clinician: Referring Provider: Merrie Roof Treating Provider/Extender: Melburn Hake, Doyce Saling Weeks in Treatment: 3 Verbal / Phone Orders: No Diagnosis Coding ICD-10 Coding Code Description [id number] Other chronic osteomyelitis, left ankle and foot E11.621 Type 2 diabetes mellitus with foot ulcer L97.522 Non-pressure chronic ulcer of other part of left foot with fat layer exposed I10 Essential (primary) hypertension Wound Cleansing Wound #3 Left Toe Great o Dial antibacterial soap, wash wounds, rinse and pat dry prior to dressing wounds o May Shower, gently pat wound dry prior to applying new dressing. Anesthetic (add to Medication List) Wound #3 Left Toe Great o Topical Lidocaine 4% cream applied to wound bed prior to debridement (In Clinic Only). Primary Wound Dressing Wound #3 Left Toe Great o Iodoflex Secondary Dressing Wound #3 Left Toe Great o Dry Gauze o Conform/Kerlix Dressing Change Frequency Wound #3 Left Toe Great o Change dressing every day. Follow-up Appointments Wound #3 Left Toe Great o Return Appointment in 1 week. Patient Medications Allergies: No Known Drug Allergies Notifications Medication Indication Start End doxycycline hyclate 08/07/2019 DOSE 1 - oral 100 mg capsule - 1 capsule oral taken 2 times a day for 14 days Timothy Gibson, Timothy Gibson ([id number]) Electronic  Signature(s) Signed: 08/07/2019 9:39:48 AM By: Worthy Keeler PA-C Entered By: Worthy Keeler on 08/07/2019 09:39:48 Timothy Gibson, Timothy Gibson ([id number]) -------------------------------------------------------------------------------- Problem List Details Patient Name: Timothy Gibson Date of Service: 08/07/2019 8:45 AM Medical Record Number: [id number] Patient Account Number: [id number] Date of Birth/Sex: Sep 04, 1950 (68 y.o. M) Treating RN: Army Melia Primary Care Provider: Merrie Roof Other Clinician: Referring Provider: Merrie Roof Treating Provider/Extender: Melburn Hake, Jadis Pitter Weeks in Treatment: 3 Active Problems ICD-10 Evaluated Encounter Code Description Active Date Today Diagnosis [phone] Other chronic osteomyelitis, left ankle and foot 07/29/2019 No Yes E11.621 Type 2 diabetes mellitus with foot ulcer 07/15/2019 No Yes L97.522 Non-pressure chronic ulcer of other part of left foot with fat 07/15/2019 No Yes layer exposed I10 Essential (primary) hypertension 07/15/2019 No Yes Inactive Problems Resolved Problems Electronic Signature(s) Signed: 08/07/2019 8:54:48 AM By: Worthy Keeler PA-C Entered By: Worthy Keeler on 08/07/2019 08:54:48 Timothy Gibson ([id number]) -------------------------------------------------------------------------------- Progress Note Details Patient Name: Timothy Gibson Date of Service: 08/07/2019 8:45 AM Medical Record Number: [id number] Patient Account Number: [id number] Date of Birth/Sex: 1951/01/16 (68 y.o. M) Treating RN: Army Melia Primary Care Provider: Merrie Roof Other Clinician: Referring Provider: Merrie Roof Treating Provider/Extender: Melburn Hake, Tysheka Fanguy Weeks in Treatment: 3 Subjective Chief Complaint Information obtained from Patient Left Great Toe History of Present Illness (HPI) 11/27/17 on evaluation today patient presents for initial evaluation concerning an issue that he has been having with his right great toe which began last  Thursday 11/22/17. He has a history of diabetes mellitus  type to which he has had for greater than 30 years currently he is on insulin. Subsequently he also has a history of hypertension. On physical exam inspection is also appears he may have some peripheral vascular disease with his ABI's being noncompressible bilaterally. He is on dialysis as well and this is on Monday, Wednesday, and Friday. Patient was hospitalized back in January/February 2019 although this was more for stomach/back pain though they never told me exactly what was going on. This is according to the patient. Subsequently the ulcer which is between the third and fourth toe when space of the right foot as well is on the plantar aspect of the fourth toe is due to him having cleaned his toes this morning and he states that his finger which is large split the toe tissue in between causing the wounds that we currently see. With that being said he states this has happened before I explained I would definitely recommend that he not do this any longer. He can use a small soft washcloth to clean between the toes without causing trauma. Subsequently the right great toe hatchery does show evidence of necrotic tissue present on the surface of the wound specifically there is callous and dead skin surrounding which is trapping fluid causing problems as far as the wound is concerned. The surface of the wound does show slough although due to his vascular flow I'm not going to sharply debride this today I think I will selectively debride away the necrotic skin as well is callous from around the surrounding so this will not continue to be a moisture issue. Nonetheless the patient has no pain he does have diabetic neuropathy. 12/06/17-He is here in follow-up evaluation for a right great toe ulcer. He is voicing no complaints or concerns. He continues to infrequently/socially smokes cigars with no desire to quit. He has been compliant with offloading,  using open toed surgical shoe; has been compliant using Santyl daily. He continues on clindamycin although takes it inconsistently secondary to indigestion. The plain film x-ray performed on 5/23 impression: Soft tissue swelling of the left first toe and is noted with probable irregular lucency involving distal tuft of first distal phalanx concerning for osteomyelitis. MRI may be performed for further evaluation; MRI ordered. The vascular evaluation performed on 5/24 field non-compressible ABIs bilaterally and reduced TBI bilaterally suggesting significant tibial disease. He will be referred to vascular medicine for further evaluation. 12/13/17-He is here in follow-up evaluation for right great toe ulcer. He has appointment next Thursday for the MRI and vascular evaluation. Wound culture obtained last week grew abundant Klebsiella oxytoca (multidrug sensitivity), and abundant enteric coccus faecalis (sensitive to ampicillin). Ampicillin and Cipro were called in, along with a probiotic. In light of his appointments next Thursday he will follow-up in 2 weeks, we will continue with Santyl 12/27/17-He is here for evaluation for a right great toe ulcer. 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 He did have evaluation by vascular medicine, Dr. Ronalee Belts, on 6/13, at that appointment it was decided for him to undergo angiography with possible intervention but he refused and is still considering. If he chooses to have it done he will contact the office. He has not heard back from either ID offices that he was referred to two weeks ago: [place] and [place]. there is improvement in both appearance and measurement to the ulcer. We will extend antibiotic therapy (amoxicillin 500 every 12,  Cipro 500 daily) for additional 2 weeks pending infectious disease consult, he will continue with Santyl daily. He was reminded to continue with  probiotic therapy while on oral antibiotics; he denies any GI disturbance. 01/03/18-He is here in follow-up evaluation for right great toe ulcer. He is decided to not undergo any vascular intervention at this time. He was established with [facility] infectious disease (Dr. Linus Salmons) last week initiated on vancomycin and cefazolin with dialysis treatment for 6 weeks. We will continue with current treatment plan with Santyl and offloading and he will follow- up in 2 weeks 01/17/18-He is seen in follow-up evaluation for right great toe ulcer. There is improvement in appearance with less nonviable tissue present. We will continue with same treatment plan he will follow-up next week. He is tolerating IV antibiotics without Timothy Gibson, Timothy Gibson. ([id number]) any complications 2/99/24-QA is seen in follow up evaluation for right great toe ulcer. There continues to be improvement. He is tolerating IV antibiotics with hemodialysis, completion date of 7/31. We will transition to collagen and and follow-up next week 02/07/18-He is here in follow up for a right great toe ulcer. There is red granulation tissue throughout the wound, improved in appearance. He completed antibiotics yesterday. He saw podiatry last Thursday for nail trimming, at that appointment he periwound callus was trimmed and a culture was obtained; culture negative. He does not have a follow up appointment with infectious disease. We will submit for grafix. 02/14/18-He is here in follow-up evaluation for a right great toe ulcer. There is slow improvement, red granulation tissue throughout. The insurance approval for grafix is pending. We will continue with collagen and he'll follow-up next week 02/21/18-He is seen in follow-up evaluation for right great toe ulcer. There is improvement with red granulation tissue throughout. He was approved for oasis and this was placed today. He will follow up next week 02/28/18-He is seen in follow-up evaluation for  right great toe ulcer. The wound is stable, oasis applied and he'll follow-up next week 03/07/18-He is seen in follow-up violation of her right great toe ulcer. The wound is dry, no evidence of drainage. The wound appears healed today. He was painted with Betadine, instructed to paint with Betadine daily, cover with band-aid for the next week and then cover with band-aid for the following week continue with surgical shoe. He was encouraged to contact the clinic with any evidence of drainage, or change in appearance to toe/wound. He will be discharged from wound care services Readmission: 07/14/18 on evaluation today patient presents for initial evaluation our clinic concerning issues that he is having with his left great toe. We previously saw him in 2019 for his right great toe which was actually worse at that time and subsequently was able to be completely healed in the end although it did take several months. With that being said the patient is currently having an area on his left great toe that occurred initially as result of a blister that came up he's not really sure why or how. Subsequently this was initially treated by his podiatrist though the recommendation there was apparently amputation according to the patient and his daughter who was present with him today. With that being said the patient did not want to proceed with amputation and would like to try to perform wound care and get this to heal without having to go down that road. We were able to do that with his other toe and so he would like to at least give that a  try before going forward with amputation. Again I completely understand his concern in this regard. With that being said I did discuss with the patient obviously that it's always a chance that we cannot get this to heal with normal wound care measures but obviously we will give it our best shot. He does actually have an appointment scheduled for later today with the vascular  specialist in order to evaluate his blood flow. That was at the recommendation of his podiatrist as well. Obviously if he is having good arterial flow and nonetheless is still experiencing issues with having the wound delay in healing then it may simply be a matter of we need to initiate more aggressive wound to therapy in order to see if we can get this to heal. No fevers, chills, nausea, or vomiting noted at this time. The patient notes that this woman has been present for approximately three weeks. He is not a smoker. His most recent hemoglobin A1c was 6.2 on 06/18/19. 07/17/2019 on evaluation today patient appears unfortunately to not be doing as well. He did see vascular I did review the note as well. That was on 07/15/2019. Subsequently there can actually be taking him to the OR for an angiogram due to poor arterial flow into this extremity. Apparently the atherosclerotic changes were severe. The patient is stated to be at risk for limb loss. Nonetheless the good news is this is being scheduled fairly quickly he will have the surgery/procedure next Thursday. With that being said I am going also go ahead based on the results of his x-ray which showed some chance of osteomyelitis in the distal portion of the toe get the patient set up for an MRI to further evaluate for the possibility of osteomyelitis obviously if he does have osteomyelitis we need to know so that we can address this appropriately. 07/29/2019 upon evaluation today patient appears to be doing better in general with regard to his toe ulcer. He does have much better blood flow you can actually feel a palpable pulse which appears to be very strong at this time. I did review his arterial intervention/angiogram which did show that he had evidence of sufficient arterial flow restoration at this point. He did have occlusions that were 60 and 80% that residually were only 10 and 25% with no limiting flow. This is excellent news and hopefully  he will continue to show signs of improvement in light of the improved vascular status. With that being said the toe mainly shows somewhat of an eschar today I think that this is fairly stable and as such probably would be best not to really do much in the way of debridement at this point I do believe Betadine would likely be a good option. 08/07/2019 on evaluation today patient actually appears to be doing about the same. The one difference is the eschar that we were just seeing if we can maintain is actually starting to loosen up and leak around the edges I think it is time to actually go ahead and remove this today. Is also no longer stable is very soft. Nonetheless the patient is okay with proceeding with debridement at this point. Fortunately there is no signs of active infection. No fevers, chills, nausea, vomiting, or diarrhea. The patient states that he took his last antibiotic that he had last night. He would need a refill as he will not be seeing infectious disease until February 2. Nonetheless obviously I do not want him to have any break in  therapy especially when he is doing as well as he is currently. Timothy Gibson, Timothy Gibson ([id number]) Objective Constitutional Well-nourished and well-hydrated in no acute distress. Vitals Time Taken: 8:55 AM, Height: 76 in, Weight: 244 lbs, BMI: 29.7, Temperature: 98.4 F, Pulse: 85 bpm, Respiratory Rate: 16 breaths/min, Blood Pressure: 168/82 mmHg. Respiratory normal breathing without difficulty. Psychiatric this patient is able to make decisions and demonstrates good insight into disease process. Alert and Oriented x 3. pleasant and cooperative. General Notes: Patient's wound bed currently did have eschar noted a lot of dry skin around the toe as well. This did require fairly aggressive sharp debridement today to remove both the eschar as well as the dry. Over the eschar region especially/callus skin around the edges of the wound. I did perform the  sharp debridement without any complication or severe bleeding towards the nailbed the patient actually was able to tolerate with no pain although he did have a little bit of bleeding right at the nailbed more proximal. I did therefore use a little bit of silver nitrate to complete cauterize this area to prevent any bleeding when he gets home he had no issues with that it was just a very small area. Integumentary (Hair, Skin) Wound #3 status is Open. Original cause of wound was Gradually Appeared. The wound is located on the Left Toe Great. The wound measures 2.5cm length x 3cm width x 0.1cm depth; 5.89cm^2 area and 0.589cm^3 volume. Assessment Active Problems ICD-10 Other chronic osteomyelitis, left ankle and foot Type 2 diabetes mellitus with foot ulcer Non-pressure chronic ulcer of other part of left foot with fat layer exposed Essential (primary) hypertension Procedures Wound #3 Timothy Gibson, Timothy Gibson ([id number]) Pre-procedure diagnosis of Wound #3 is a Diabetic Wound/Ulcer of the Lower Extremity located on the Left Toe Great .Severity of Tissue Pre Debridement is: Fat layer exposed. There was a Excisional Skin/Subcutaneous Tissue Debridement with a total area of 7.5 sq cm performed by [name], Kirston Luty E., PA-C. With the following instrument(s): Curette to remove Viable and Non-Viable tissue/material. Material removed includes Callus, Subcutaneous Tissue, and Slough after achieving pain control using Lidocaine. A time out was conducted at 09:18, prior to the start of the procedure. A Minimum amount of bleeding was controlled with Pressure. The procedure was tolerated well. Post Debridement Measurements: 2.5cm length x 3cm width x 0.1cm depth; 0.589cm^3 volume. Character of Wound/Ulcer Post Debridement is stable. Severity of Tissue Post Debridement is: Fat layer exposed. Post procedure Diagnosis Wound #3: Same as Pre-Procedure Plan Wound Cleansing: Wound #3 Left Toe Great: Dial  antibacterial soap, wash wounds, rinse and pat dry prior to dressing wounds May Shower, gently pat wound dry prior to applying new dressing. Anesthetic (add to Medication List): Wound #3 Left Toe Great: Topical Lidocaine 4% cream applied to wound bed prior to debridement (In Clinic Only). Primary Wound Dressing: Wound #3 Left Toe Great: Iodoflex Secondary Dressing: Wound #3 Left Toe Great: Dry Gauze Conform/Kerlix Dressing Change Frequency: Wound #3 Left Toe Great: Change dressing every day. Follow-up Appointments: Wound #3 Left Toe Great: Return Appointment in 1 week. The following medication(s) was prescribed: doxycycline hyclate oral 100 mg capsule 1 1 capsule oral taken 2 times a day for 14 days starting 08/07/2019 1. My suggestion at this time is can be that we continue at this point with the Iodoflex I think that still probably the best option for the patient. This will help continue to clean things up. 2. I am in a suggest as well a continuation  of the doxycycline which I do believe has been beneficial based on what I can see. He will be seeing infectious disease obviously that was to see whether or not IV antibiotics would be preferable especially since he is a dialysis patient. With that being said he has that appointment on February 2 in the meantime and get a keep him with the oral medication. 3. I do recommend that he continue with offloading using the postop surgical shoe I think that still ideal. 4. I do feel like he has excellent blood flow based on what I am seeing today this is great news things have really turned around since they underwent the vascular procedure for him. He does have a follow-up mid February with the vascular surgeon. We will see patient back for reevaluation in 1 week here in the clinic. If anything worsens or changes patient will contact our office for additional recommendations. Timothy Gibson, Timothy Gibson ([id number]) Electronic Signature(s) Signed:  08/07/2019 9:39:56 AM By: Worthy Keeler PA-C Entered By: Worthy Keeler on 08/07/2019 09:39:55 ARIHAAN, BELLUCCI ([id number]) -------------------------------------------------------------------------------- SuperBill Details Patient Name: Timothy Gibson Date of Service: 08/07/2019 Medical Record Number: [id number] Patient Account Number: [id number] Date of Birth/Sex: Nov 20, 1950 (68 y.o. M) Treating RN: Army Melia Primary Care Provider: Merrie Roof Other Clinician: Referring Provider: Merrie Roof Treating Provider/Extender: Melburn Hake, Jordane Hisle Weeks in Treatment: 3 Diagnosis Coding ICD-10 Codes Code Description [phone] Other chronic osteomyelitis, left ankle and foot E11.621 Type 2 diabetes mellitus with foot ulcer L97.522 Non-pressure chronic ulcer of other part of left foot with fat layer exposed I10 Essential (primary) hypertension Facility Procedures CPT4 Code: 57505183 Description: 35825 - DEB SUBQ TISSUE 20 SQ CM/< ICD-10 Diagnosis Description L97.522 Non-pressure chronic ulcer of other part of left foot with fat Modifier: layer exposed Quantity: 1 Physician Procedures CPT4 Code: 1898421 Description: 03128 - WC PHYS LEVEL 4 - EST PT ICD-10 Diagnosis Description M86.672 Other chronic osteomyelitis, left ankle and foot E11.621 Type 2 diabetes mellitus with foot ulcer L97.522 Non-pressure chronic ulcer of other part of left foot with fat  I10 Essential (primary) hypertension Modifier: 25 layer exposed Quantity: 1 CPT4 Code: 1188677 Description: 11042 - WC PHYS SUBQ TISS 20 SQ CM ICD-10 Diagnosis Description L97.522 Non-pressure chronic ulcer of other part of left foot with fat Modifier: layer exposed Quantity: 1 Electronic Signature(s) Signed: 08/07/2019 9:40:12 AM By: Worthy Keeler PA-C Entered By: Worthy Keeler on 08/07/2019 09:40:12

## 2019-08-08 DIAGNOSIS — N186 End stage renal disease: Secondary | ICD-10-CM | POA: Diagnosis not present

## 2019-08-08 DIAGNOSIS — D509 Iron deficiency anemia, unspecified: Secondary | ICD-10-CM | POA: Diagnosis not present

## 2019-08-08 DIAGNOSIS — D631 Anemia in chronic kidney disease: Secondary | ICD-10-CM | POA: Diagnosis not present

## 2019-08-08 DIAGNOSIS — N2581 Secondary hyperparathyroidism of renal origin: Secondary | ICD-10-CM | POA: Diagnosis not present

## 2019-08-08 DIAGNOSIS — Z992 Dependence on renal dialysis: Secondary | ICD-10-CM | POA: Diagnosis not present

## 2019-08-10 DIAGNOSIS — Z992 Dependence on renal dialysis: Secondary | ICD-10-CM | POA: Diagnosis not present

## 2019-08-10 DIAGNOSIS — N186 End stage renal disease: Secondary | ICD-10-CM | POA: Diagnosis not present

## 2019-08-11 ENCOUNTER — Ambulatory Visit (INDEPENDENT_AMBULATORY_CARE_PROVIDER_SITE_OTHER): Payer: Medicare Other | Admitting: Internal Medicine

## 2019-08-11 ENCOUNTER — Other Ambulatory Visit: Payer: Self-pay

## 2019-08-11 ENCOUNTER — Encounter: Payer: Self-pay | Admitting: Internal Medicine

## 2019-08-11 DIAGNOSIS — D631 Anemia in chronic kidney disease: Secondary | ICD-10-CM | POA: Diagnosis not present

## 2019-08-11 DIAGNOSIS — L97909 Non-pressure chronic ulcer of unspecified part of unspecified lower leg with unspecified severity: Secondary | ICD-10-CM

## 2019-08-11 DIAGNOSIS — I70299 Other atherosclerosis of native arteries of extremities, unspecified extremity: Secondary | ICD-10-CM

## 2019-08-11 DIAGNOSIS — N2581 Secondary hyperparathyroidism of renal origin: Secondary | ICD-10-CM | POA: Diagnosis not present

## 2019-08-11 DIAGNOSIS — N186 End stage renal disease: Secondary | ICD-10-CM | POA: Diagnosis not present

## 2019-08-11 DIAGNOSIS — I96 Gangrene, not elsewhere classified: Secondary | ICD-10-CM | POA: Insufficient documentation

## 2019-08-11 DIAGNOSIS — Z992 Dependence on renal dialysis: Secondary | ICD-10-CM | POA: Diagnosis not present

## 2019-08-11 DIAGNOSIS — D509 Iron deficiency anemia, unspecified: Secondary | ICD-10-CM | POA: Diagnosis not present

## 2019-08-11 NOTE — Progress Notes (Signed)
[facility]      Reason for Consult: ? Osteomyelitis left great toe    Referring Physician: Jeri Cos, PA    Patient ID: Timothy Gibson, male    DOB: 12-26-1950, 69 y.o.   MRN: [id number]  HPI:   He is here for evaluation of his great toe with known vascular/dry gangrene.  He has a history of PAD and developed a wound of his great toe about 1 month ago.  Initially he reports the wound did not heal well and developed a dusky appearance of his toe and evaluated by wound care who sent him to vascular surgery.  He was seen by Dr. Lucky Cowboy of vascular surgery and noted critical limb ischemia.  On 1/14 he underwent angiogram and performed an angioplasty to his left side with good results.  He know has good blood flow.  The patient reports improved healing and nearly resolved ulcer.  He did get an MRI on 1/9 prior to the intervention and noted marrow edema and felt to represent osteomyelitis.  Also some edema over the dorsum of the foot.   Previous record reviewed in Epic including the MRI as described.    Past Medical History:  Diagnosis Date  . Cancer ([place])    right kidney  . Diabetes mellitus without complication ([place])   . Dialysis patient [facility])    on Mon-Wed-Fri dialysis  . ESRD (end stage renal disease) ([place])   . History of gangrene   . Hypertension     Prior to Admission medications   Medication Sig Start Date End Date Taking? Authorizing Provider  acetaminophen (TYLENOL) 500 MG tablet Take 500 mg by mouth daily as needed for moderate pain.   Yes [provider]  albuterol (VENTOLIN HFA) 108 (90 Base) MCG/ACT inhaler Inhale 2 puffs into the lungs every 6 (six) hours as needed for wheezing or shortness of breath. 06/16/19  Yes Volney American, PA-C  amLODipine (NORVASC) 5 MG tablet Take 1 tablet (5 mg total) by mouth daily. 06/16/19  Yes Volney American, PA-C  [facility] ELLIPTA 62.5-25 MCG/INH AEPB INHALE 1 PUFF INTO THE LUNGS DAILY 05/15/19   Yes Volney American, [place]  aspirin EC 81 MG tablet Take 1 tablet (81 mg total) by mouth daily. 07/24/19  Yes Dew, Erskine Squibb, MD  atorvastatin (LIPITOR) 10 MG tablet Take 1 tablet (10 mg total) by mouth daily. 07/24/19 07/23/20 Yes Dew, Erskine Squibb, MD  clopidogrel (PLAVIX) 75 MG tablet Take 1 tablet (75 mg total) by mouth daily. 07/24/19  Yes Dew, Erskine Squibb, MD  doxycycline (VIBRAMYCIN) 100 MG capsule Take 100 mg by mouth 2 (two) times daily. 08/09/19  Yes [provider]  insulin degludec (TRESIBA) 100 UNIT/ML SOPN FlexTouch Pen Inject 0.12 mLs (12 Units total) into the skin at bedtime. 06/16/19  Yes Volney American, PA-C  losartan (COZAAR) 100 MG tablet Take 1 tablet (100 mg total) by mouth daily. 06/16/19  Yes Volney American, PA-C  sevelamer carbonate (RENVELA) 800 MG tablet Take 3,200 mg by mouth 3 (three) times daily with meals.    Yes [provider]  EPINEPHrine 0.3 mg/0.3 mL IJ SOAJ injection Inject 0.3 mg into the muscle as needed for anaphylaxis.    [provider]    Allergies  Allergen Reactions  . Adhesive [Tape]     Pulls skin off.  Must use paper tape    Social History   Tobacco Use  . Smoking status:  Current Some Day Smoker  . Smokeless tobacco: Former Systems developer  . Tobacco comment: cigar smoker  Substance Use Topics  . Alcohol use: Not Currently  . Drug use: Never    Family History  Problem Relation Age of Onset  . Hypertension Father   . Colon cancer Father   . Hypertension Mother   . Parkinson's disease Mother   . Diabetes Mother   . Alzheimer's disease Mother     Review of Systems  Constitutional: negative for fevers Integument/breast: negative for rash All other systems reviewed and are negative    Constitutional: in no apparent distress  Vitals:   08/11/19 1401  Pulse: 83  Temp: (!) 97.5 F (36.4 C)  SpO2: 99%   EYES: anicteric ENMT: + mask Musculoskeletal: no pedal edema noted, left great toe dry, no purulence,  ulcer area closed, no erythema, no warmth Skin: negatives: no rash  Labs: Lab Results  Component Value Date   WBC 2.8 (L) 05/23/2017   HGB 10.6 (L) 05/23/2017   HCT 33.0 (L) 05/23/2017   MCV 92.3 05/23/2017   PLT 130 (L) 05/23/2017    Lab Results  Component Value Date   CREATININE 8.43 (H) 07/24/2019   BUN 32 (H) 07/24/2019   NA 143 08/15/2018   K 4.2 08/15/2018   CL 96 08/15/2018   CO2 29 08/15/2018    Lab Results  Component Value Date   ALT 10 08/15/2018   AST 15 08/15/2018   ALKPHOS 88 08/15/2018   BILITOT 0.4 08/15/2018   INR 1.43 03/20/2017     Assessment: critical limb ischemia s/p angioplasty.  His toe is now much improved by his report, no signs of active infection.  The MRI was reviewed and was done in the setting of critical limb ischemia.  No lytic lesions noted.  Most c/w reactive marrow edema.  I discussed with him that I would not anticipate good healing as he has with underlying osteomyelitis and therefore with no signs of infection, no open ulcer, no indication for antibiotics.     Plan: 1) continue with wound care 2) can repeat the xray of the toe in 2 weeks for comparison  Thanks for consultation

## 2019-08-13 ENCOUNTER — Telehealth: Payer: Self-pay

## 2019-08-13 DIAGNOSIS — N2581 Secondary hyperparathyroidism of renal origin: Secondary | ICD-10-CM | POA: Diagnosis not present

## 2019-08-13 DIAGNOSIS — D631 Anemia in chronic kidney disease: Secondary | ICD-10-CM | POA: Diagnosis not present

## 2019-08-13 DIAGNOSIS — Z992 Dependence on renal dialysis: Secondary | ICD-10-CM | POA: Diagnosis not present

## 2019-08-13 DIAGNOSIS — D509 Iron deficiency anemia, unspecified: Secondary | ICD-10-CM | POA: Diagnosis not present

## 2019-08-13 DIAGNOSIS — N186 End stage renal disease: Secondary | ICD-10-CM | POA: Diagnosis not present

## 2019-08-13 MED ORDER — ANORO ELLIPTA 62.5-25 MCG/INH IN AEPB: INHALATION_SPRAY | RESPIRATORY_TRACT | 1 refills | Status: DC

## 2019-08-13 NOTE — Telephone Encounter (Signed)
Fax from [facility]. Requesting change for Anoro to be a 90 day supply instead of 30. Prescription benefit covers 90 day.

## 2019-08-13 NOTE — Telephone Encounter (Signed)
Rx modified

## 2019-08-14 ENCOUNTER — Encounter: Payer: Medicare Other | Attending: Physician Assistant | Admitting: Physician Assistant

## 2019-08-14 ENCOUNTER — Other Ambulatory Visit: Payer: Self-pay

## 2019-08-14 DIAGNOSIS — Z85528 Personal history of other malignant neoplasm of kidney: Secondary | ICD-10-CM | POA: Insufficient documentation

## 2019-08-14 DIAGNOSIS — Z809 Family history of malignant neoplasm, unspecified: Secondary | ICD-10-CM | POA: Insufficient documentation

## 2019-08-14 DIAGNOSIS — Z833 Family history of diabetes mellitus: Secondary | ICD-10-CM | POA: Diagnosis not present

## 2019-08-14 DIAGNOSIS — N186 End stage renal disease: Secondary | ICD-10-CM | POA: Diagnosis not present

## 2019-08-14 DIAGNOSIS — M069 Rheumatoid arthritis, unspecified: Secondary | ICD-10-CM | POA: Insufficient documentation

## 2019-08-14 DIAGNOSIS — Z992 Dependence on renal dialysis: Secondary | ICD-10-CM | POA: Insufficient documentation

## 2019-08-14 DIAGNOSIS — E1122 Type 2 diabetes mellitus with diabetic chronic kidney disease: Secondary | ICD-10-CM | POA: Diagnosis not present

## 2019-08-14 DIAGNOSIS — E1169 Type 2 diabetes mellitus with other specified complication: Secondary | ICD-10-CM | POA: Diagnosis not present

## 2019-08-14 DIAGNOSIS — E11621 Type 2 diabetes mellitus with foot ulcer: Secondary | ICD-10-CM | POA: Diagnosis not present

## 2019-08-14 DIAGNOSIS — Z8249 Family history of ischemic heart disease and other diseases of the circulatory system: Secondary | ICD-10-CM | POA: Insufficient documentation

## 2019-08-14 DIAGNOSIS — Z8349 Family history of other endocrine, nutritional and metabolic diseases: Secondary | ICD-10-CM | POA: Diagnosis not present

## 2019-08-14 DIAGNOSIS — M86672 Other chronic osteomyelitis, left ankle and foot: Secondary | ICD-10-CM | POA: Diagnosis not present

## 2019-08-14 DIAGNOSIS — E1140 Type 2 diabetes mellitus with diabetic neuropathy, unspecified: Secondary | ICD-10-CM | POA: Diagnosis not present

## 2019-08-14 DIAGNOSIS — F419 Anxiety disorder, unspecified: Secondary | ICD-10-CM | POA: Diagnosis not present

## 2019-08-14 DIAGNOSIS — E1151 Type 2 diabetes mellitus with diabetic peripheral angiopathy without gangrene: Secondary | ICD-10-CM | POA: Diagnosis not present

## 2019-08-14 DIAGNOSIS — L97522 Non-pressure chronic ulcer of other part of left foot with fat layer exposed: Secondary | ICD-10-CM | POA: Diagnosis not present

## 2019-08-14 DIAGNOSIS — I120 Hypertensive chronic kidney disease with stage 5 chronic kidney disease or end stage renal disease: Secondary | ICD-10-CM | POA: Diagnosis not present

## 2019-08-14 DIAGNOSIS — F17290 Nicotine dependence, other tobacco product, uncomplicated: Secondary | ICD-10-CM | POA: Diagnosis not present

## 2019-08-14 DIAGNOSIS — Z794 Long term (current) use of insulin: Secondary | ICD-10-CM | POA: Diagnosis not present

## 2019-08-14 DIAGNOSIS — Z823 Family history of stroke: Secondary | ICD-10-CM | POA: Insufficient documentation

## 2019-08-14 NOTE — Progress Notes (Signed)
LEBRON, NAUERT ([id number]) Visit Report for 08/14/2019 Arrival Information Details Patient Name: Timothy Gibson, Timothy Gibson Date of Service: 08/14/2019 8:45 AM Medical Record Number: [id number] Patient Account Number: [id number] Date of Birth/Sex: 07/09/1951 (69 y.o. M) Treating RN: Army Melia Primary Care Yohannes Waibel: Merrie Roof Other Clinician: Referring Angelika Jerrett: Merrie Roof Treating Kani Jobson/Extender: Melburn Hake, [name] Weeks in Treatment: 4 Visit Information History Since Last Visit Added or deleted any medications: No Patient Arrived: Cane Any new allergies or adverse reactions: No Arrival Time: 08:40 Had a fall or experienced change in No Accompanied By: self activities of daily living that may affect Transfer Assistance: None risk of falls: Patient Identification Verified: Yes Signs or symptoms of abuse/neglect since last visito No Secondary Verification Process Completed: Yes Hospitalized since last visit: No Implantable device outside of the clinic excluding No cellular tissue based products placed in the center since last visit: Has Dressing in Place as Prescribed: Yes Pain Present Now: No Electronic Signature(s) Signed: 08/14/2019 11:49:35 AM By: Lorine Bears RCP, RRT, CHT Entered By: Lorine Bears on 08/14/2019 08:41:03 Timothy Gibson ([id number]) -------------------------------------------------------------------------------- Clinic Level of Care Assessment Details Patient Name: Timothy Gibson Date of Service: 08/14/2019 8:45 AM Medical Record Number: [id number] Patient Account Number: [id number] Date of Birth/Sex: Jul 30, 1950 (69 y.o. M) Treating RN: Army Melia Primary Care Tab Rylee: Merrie Roof Other Clinician: Referring Areya Lemmerman: Merrie Roof Treating Sela Falk/Extender: Melburn Hake, [name] Weeks in Treatment: 4 Clinic Level of Care Assessment Items TOOL 4 Quantity Score []  - Use when only an EandM is performed on FOLLOW-UP visit  0 ASSESSMENTS - Nursing Assessment / Reassessment []  - Reassessment of Co-morbidities (includes updates in patient status) 0 []  - 0 Reassessment of Adherence to Treatment Plan ASSESSMENTS - Wound and Skin Assessment / Reassessment []  - Simple Wound Assessment / Reassessment - one wound 0 []  - 0 Complex Wound Assessment / Reassessment - multiple wounds []  - 0 Dermatologic / Skin Assessment (not related to wound area) ASSESSMENTS - Focused Assessment []  - Circumferential Edema Measurements - multi extremities 0 []  - 0 Nutritional Assessment / Counseling / Intervention []  - 0 Lower Extremity Assessment (monofilament, tuning fork, pulses) []  - 0 Peripheral Arterial Disease Assessment (using hand held doppler) ASSESSMENTS - Ostomy and/or Continence Assessment and Care []  - Incontinence Assessment and Management 0 []  - 0 Ostomy Care Assessment and Management (repouching, etc.) PROCESS - Coordination of Care []  - Simple Patient / Family Education for ongoing care 0 []  - 0 Complex (extensive) Patient / Family Education for ongoing care []  - 0 Staff obtains Programmer, systems, Records, Test Results / Process Orders []  - 0 Staff telephones HHA, Nursing Homes / Clarify orders / etc []  - 0 Routine Transfer to another Facility (non-emergent condition) []  - 0 Routine Hospital Admission (non-emergent condition) []  - 0 New Admissions / Biomedical engineer / Ordering NPWT, Apligraf, etc. []  - 0 Emergency Hospital Admission (emergent condition) []  - 0 Simple Discharge Coordination GIFFORD, BALLON ([id number]) []  - 0 Complex (extensive) Discharge Coordination PROCESS - Special Needs []  - Pediatric / Minor Patient Management 0 []  - 0 Isolation Patient Management []  - 0 Hearing / Language / Visual special needs []  - 0 Assessment of Community assistance (transportation, D/C planning, etc.) []  - 0 Additional assistance / Altered mentation []  - 0 Support Surface(s) Assessment (bed,  cushion, seat, etc.) INTERVENTIONS - Wound Cleansing / Measurement []  - Simple Wound Cleansing - one wound 0 []  - 0 Complex Wound Cleansing - multiple wounds []  - 0 Wound  Imaging (photographs - any number of wounds) []  - 0 Wound Tracing (instead of photographs) []  - 0 Simple Wound Measurement - one wound []  - 0 Complex Wound Measurement - multiple wounds INTERVENTIONS - Wound Dressings []  - Small Wound Dressing one or multiple wounds 0 []  - 0 Medium Wound Dressing one or multiple wounds []  - 0 Large Wound Dressing one or multiple wounds []  - 0 Application of Medications - topical []  - 0 Application of Medications - injection INTERVENTIONS - Miscellaneous []  - External ear exam 0 []  - 0 Specimen Collection (cultures, biopsies, blood, body fluids, etc.) []  - 0 Specimen(s) / Culture(s) sent or taken to Lab for analysis []  - 0 Patient Transfer (multiple staff / Civil Service fast streamer / Similar devices) []  - 0 Simple Staple / Suture removal (25 or less) []  - 0 Complex Staple / Suture removal (26 or more) []  - 0 Hypo / Hyperglycemic Management (close monitor of Blood Glucose) []  - 0 Ankle / Brachial Index (ABI) - do not check if billed separately []  - 0 Vital Signs MICHOLAS, DRUMWRIGHT ([id number]) Has the patient been seen at the hospital within the last three years: Yes Total Score: 0 Level Of Care: ____ Electronic Signature(s) Signed: 08/14/2019 11:39:54 AM By: Army Melia Entered By: Army Melia on 08/14/2019 09:08:33 Timothy Gibson ([id number]) -------------------------------------------------------------------------------- Encounter Discharge Information Details Patient Name: Timothy Gibson Date of Service: 08/14/2019 8:45 AM Medical Record Number: [id number] Patient Account Number: [id number] Date of Birth/Sex: [birth year] (69 y.o. M) Treating RN: Army Melia Primary Care Delise Simenson: Merrie Roof Other Clinician: Referring Zenith Lamphier: Merrie Roof Treating Ruth Tully/Extender:  Melburn Hake, [name] Weeks in Treatment: 4 Encounter Discharge Information Items Post Procedure Vitals Discharge Condition: Stable Temperature (F): 97.4 Ambulatory Status: Ambulatory Pulse (bpm): 90 Discharge Destination: Home Respiratory Rate (breaths/min): 16 Transportation: Private Auto Blood Pressure (mmHg): 132/62 Accompanied By: family Schedule Follow-up Appointment: Yes Clinical Summary of Care: Electronic Signature(s) Signed: 08/14/2019 11:39:54 AM By: Army Melia Entered By: Army Melia on 08/14/2019 09:10:02 Timothy Gibson ([id number]) -------------------------------------------------------------------------------- Lower Extremity Assessment Details Patient Name: Timothy Gibson Date of Service: 08/14/2019 8:45 AM Medical Record Number: [id number] Patient Account Number: [id number] Date of Birth/Sex: April 10, 1951 (69 y.o. M) Treating RN: Montey Hora Primary Care Shalon Councilman: Merrie Roof Other Clinician: Referring Catrina Fellenz: Merrie Roof Treating Kiano Terrien/Extender: [name], [name] Weeks in Treatment: 4 Edema Assessment Assessed: [Left: No] [Right: No] Edema: [Left: Ye] [Right: s] Vascular Assessment Pulses: Dorsalis Pedis Palpable: [Left:Yes] Electronic Signature(s) Signed: 08/14/2019 4:59:12 PM By: Montey Hora Entered By: Montey Hora on 08/14/2019 08:49:37 Timothy Gibson ([id number]) -------------------------------------------------------------------------------- Multi Wound Chart Details Patient Name: Timothy Gibson Date of Service: 08/14/2019 8:45 AM Medical Record Number: [id number] Patient Account Number: [id number] Date of Birth/Sex: 10-24-1950 (69 y.o. M) Treating RN: Army Melia Primary Care Latrisha Coiro: Merrie Roof Other Clinician: Referring Abel Ra: Merrie Roof Treating Payson Crumby/Extender: Melburn Hake, [name] Weeks in Treatment: 4 Vital Signs Height(in): 76 Pulse(bpm): 90 Weight(lbs): 244 Blood Pressure(mmHg): 132/62 Body Mass Index(BMI):  30 Temperature(F): 97.4 Respiratory Rate 16 (breaths/min): Photos: [N/A:N/A] Wound Location: Left Toe Great N/A N/A Wounding Event: Gradually Appeared N/A N/A Primary Etiology: Diabetic Wound/Ulcer of the N/A N/A Lower Extremity Comorbid History: Lymphedema, Arrhythmia, N/A N/A Hypertension, Type II Diabetes, End Stage Renal Disease, Rheumatoid Arthritis, Osteoarthritis, Confinement Anxiety Date Acquired: 06/19/2019 N/A N/A Weeks of Treatment: 4 N/A N/A Wound Status: Open N/A N/A Measurements L x W x D 3.1x1.8x0.1 N/A N/A (cm) Area (cm) : 4.383 N/A N/A Volume (cm) : 0.438 N/A  N/A % Reduction in Area: -61.70% N/A N/A % Reduction in Volume: -61.60% N/A N/A Classification: Grade 2 N/A N/A Exudate Amount: Medium N/A N/A Exudate Type: Serous N/A N/A Exudate Color: amber N/A N/A Wound Margin: Flat and Intact N/A N/A Granulation Amount: Small (1-33%) N/A N/A Granulation Quality: Pink N/A N/A Necrotic Amount: Large (67-100%) N/A N/A Necrotic Tissue: Eschar N/A N/A Exposed Structures: N/A N/A SUVAN, STCYR ([id number]) Fat Layer (Subcutaneous Tissue) Exposed: Yes Fascia: No Tendon: No Muscle: No Joint: No Bone: No Epithelialization: None N/A N/A Treatment Notes Electronic Signature(s) Signed: 08/14/2019 11:39:54 AM By: Army Melia Entered By: Army Melia on 08/14/2019 09:00:20 Timothy Gibson ([id number]) -------------------------------------------------------------------------------- [place] Details Patient Name: Timothy Gibson Date of Service: 08/14/2019 8:45 AM Medical Record Number: [id number] Patient Account Number: [id number] Date of Birth/Sex: April 24, 1951 (69 y.o. M) Treating RN: Army Melia Primary Care Toshie Demelo: Merrie Roof Other Clinician: Referring Andreanna Mikolajczak: Merrie Roof Treating Marvens Hollars/Extender: Melburn Hake, [name] Weeks in Treatment: 4 Active Inactive Abuse / Safety / Falls / Self Care Management Nursing  Diagnoses: Potential for falls Goals: Patient will not experience any injury related to falls Date Initiated: 07/15/2019 Target Resolution Date: 10/18/2019 Goal Status: Active Interventions: Assess fall risk on admission and as needed Notes: Necrotic Tissue Nursing Diagnoses: Impaired tissue integrity related to necrotic/devitalized tissue Goals: Necrotic/devitalized tissue will be minimized in the wound bed Date Initiated: 07/15/2019 Target Resolution Date: 10/18/2019 Goal Status: Active Interventions: Provide education on necrotic tissue and debridement process Notes: Orientation to the Wound Care Program Nursing Diagnoses: Knowledge deficit related to the wound healing center program Goals: Patient/caregiver will verbalize understanding of the [facility] Program Date Initiated: 07/15/2019 Target Resolution Date: 10/18/2019 Goal Status: Active Interventions: Provide education on orientation to the wound center JOSEJULIAN, TARANGO ([id number]) Notes: Peripheral Neuropathy Nursing Diagnoses: Knowledge deficit related to disease process and management of peripheral neurovascular dysfunction Goals: Patient/caregiver will verbalize understanding of disease process and disease management Date Initiated: 07/15/2019 Target Resolution Date: 10/18/2019 Goal Status: Active Interventions: Assess signs and symptoms of neuropathy upon admission and as needed Provide education on Management of Neuropathy and Related Ulcers Notes: Wound/Skin Impairment Nursing Diagnoses: Impaired tissue integrity Goals: Ulcer/skin breakdown will heal within 14 weeks Date Initiated: 07/15/2019 Target Resolution Date: 10/18/2019 Goal Status: Active Interventions: Assess patient/caregiver ability to obtain necessary supplies Assess patient/caregiver ability to perform ulcer/skin care regimen upon admission and as needed Assess ulceration(s) every visit Notes: Electronic Signature(s) Signed:  08/14/2019 11:39:54 AM By: Army Melia Entered By: Army Melia on 08/14/2019 09:00:11 Timothy Gibson ([id number]) -------------------------------------------------------------------------------- Pain Assessment Details Patient Name: Timothy Gibson Date of Service: 08/14/2019 8:45 AM Medical Record Number: [id number] Patient Account Number: [id number] Date of Birth/Sex: 12/20/1950 (69 y.o. M) Treating RN: Army Melia Primary Care Quantavius Humm: Merrie Roof Other Clinician: Referring Arneisha Kincannon: Merrie Roof Treating Jacki Couse/Extender: Melburn Hake, [name] Weeks in Treatment: 4 Active Problems Location of Pain Severity and Description of Pain Patient Has Paino No Site Locations Pain Management and Medication Current Pain Management: Electronic Signature(s) Signed: 08/14/2019 11:39:54 AM By: Army Melia Signed: 08/14/2019 11:49:35 AM By: Lorine Bears RCP, RRT, CHT Entered By: Lorine Bears on 08/14/2019 08:41:09 Timothy Gibson ([id number]) -------------------------------------------------------------------------------- Patient/Caregiver Education Details Patient Name: Timothy Gibson Date of Service: 08/14/2019 8:45 AM Medical Record Number: [id number] Patient Account Number: [id number] Date of Birth/Gender: [birth year] (69 y.o. M) Treating RN: Army Melia Primary Care Physician: Merrie Roof Other Clinician: Referring Physician: Merrie Roof Treating Physician/Extender: Melburn Hake, [name] Weeks  in Treatment: 4 Education Assessment Education Provided To: Patient Education Topics Provided Wound/Skin Impairment: Handouts: Caring for Your Ulcer Methods: Demonstration, Explain/Verbal Responses: State content correctly Electronic Signature(s) Signed: 08/14/2019 11:39:54 AM By: Army Melia Entered By: Army Melia on 08/14/2019 09:08:49 Timothy Gibson ([id number]) -------------------------------------------------------------------------------- Wound  Assessment Details Patient Name: Timothy Gibson Date of Service: 08/14/2019 8:45 AM Medical Record Number: [id number] Patient Account Number: [id number] Date of Birth/Sex: 07/18/1950 (69 y.o. M) Treating RN: Montey Hora Primary Care Januel Doolan: Merrie Roof Other Clinician: Referring Tish Begin: Merrie Roof Treating Harue Pribble/Extender: Melburn Hake, [name] Weeks in Treatment: 4 Wound Status Wound Number: 3 Primary Diabetic Wound/Ulcer of the Lower Extremity Etiology: Wound Location: Left Toe Great Wound Open Wounding Event: Gradually Appeared Status: Date Acquired: 06/19/2019 Comorbid Lymphedema, Arrhythmia, Hypertension, Type II Weeks Of Treatment: 4 History: Diabetes, End Stage Renal Disease, Clustered Wound: No Rheumatoid Arthritis, Osteoarthritis, Confinement Anxiety Photos Wound Measurements Length: (cm) 3.1 % Reduction in Width: (cm) 1.8 % Reduction in Depth: (cm) 0.1 Epithelializati Area: (cm) 4.383 Tunneling: Volume: (cm) 0.438 Undermining: Area: -61.7% Volume: -61.6% on: None No No Wound Description Classification: Grade 2 Foul Odor After Wound Margin: Flat and Intact Slough/Fibrino Exudate Amount: Medium Exudate Type: Serous Exudate Color: amber Cleansing: No No Wound Bed Granulation Amount: Small (1-33%) Exposed Structure Granulation Quality: Pink Fascia Exposed: No Necrotic Amount: Large (67-100%) Fat Layer (Subcutaneous Tissue) Exposed: Yes Necrotic Quality: Eschar Tendon Exposed: No Muscle Exposed: No Joint Exposed: No Bone Exposed: No CADEL, STAIRS ([id number]) Treatment Notes Wound #3 (Left Toe Great) Notes iodoflex and gauze, offloading shoe Electronic Signature(s) Signed: 08/14/2019 4:59:12 PM By: Montey Hora Entered By: Montey Hora on 08/14/2019 08:48:37 Timothy Gibson ([id number]) -------------------------------------------------------------------------------- Vitals Details Patient Name: Timothy Gibson Date of  Service: 08/14/2019 8:45 AM Medical Record Number: [id number] Patient Account Number: [id number] Date of Birth/Sex: 08/22/1950 (69 y.o. M) Treating RN: Army Melia Primary Care Oluwaseun Cremer: Merrie Roof Other Clinician: Referring Lakeya Mulka: Merrie Roof Treating Shantana Christon/Extender: Melburn Hake, [name] Weeks in Treatment: 4 Vital Signs Time Taken: 08:40 Temperature (F): 97.4 Height (in): 76 Pulse (bpm): 90 Weight (lbs): 244 Respiratory Rate (breaths/min): 16 Body Mass Index (BMI): 29.7 Blood Pressure (mmHg): 132/62 Reference Range: 80 - 120 mg / dl Electronic Signature(s) Signed: 08/14/2019 11:49:35 AM By: Lorine Bears RCP, RRT, CHT Entered By: Lorine Bears on 08/14/2019 08:41:33

## 2019-08-14 NOTE — Progress Notes (Addendum)
VALOR, QUAINTANCE ([id number]) Visit Report for 08/14/2019 Chief Complaint Document Details Patient Name: Timothy Gibson, Timothy Gibson Date of Service: 08/14/2019 8:45 AM Medical Record Number: [id number] Patient Account Number: [id number] Date of Birth/Sex: 05/14/1951 (69 y.o. M) Treating RN: Army Melia Primary Care Provider: Merrie Roof Other Clinician: Referring Provider: Merrie Roof Treating Provider/Extender: Melburn Hake, Tymia Streb Weeks in Treatment: 4 Information Obtained from: Patient Chief Complaint Left Great Toe Electronic Signature(s) Signed: 08/14/2019 9:25:22 AM By: Worthy Keeler PA-C Entered By: Worthy Keeler on 08/14/2019 09:25:22 Timothy Gibson, Timothy Gibson ([id number]) -------------------------------------------------------------------------------- Debridement Details Patient Name: Timothy Gibson Date of Service: 08/14/2019 8:45 AM Medical Record Number: [id number] Patient Account Number: [id number] Date of Birth/Sex: 1951/06/27 (68 y.o. M) Treating RN: Army Melia Primary Care Provider: Merrie Roof Other Clinician: Referring Provider: Merrie Roof Treating Provider/Extender: Melburn Hake, Kyshawn Teal Weeks in Treatment: 4 Debridement Performed for Wound #3 Left Toe Great Assessment: Performed By: Physician [name], Yuvonne Lanahan E., PA-C Debridement Type: Debridement Severity of Tissue Pre Fat layer exposed Debridement: Level of Consciousness (Pre- Awake and Alert procedure): Pre-procedure Verification/Time Yes - 09:05 Out Taken: Start Time: 09:05 Pain Control: Lidocaine Total Area Debrided (L x W): 3.1 (cm) x 1.8 (cm) = 5.58 (cm) Tissue and other material Viable, Non-Viable, Callus, Slough, Subcutaneous, Slough debrided: Level: Skin/Subcutaneous Tissue Debridement Description: Excisional Instrument: Curette Bleeding: Minimum Hemostasis Achieved: Pressure End Time: 09:06 Response to Treatment: Procedure was tolerated well Level of Consciousness Awake and  Alert (Post-procedure): Post Debridement Measurements of Total Wound Length: (cm) 3.1 Width: (cm) 1.8 Depth: (cm) 0.1 Volume: (cm) 0.438 Character of Wound/Ulcer Post Debridement: Stable Severity of Tissue Post Debridement: Fat layer exposed Post Procedure Diagnosis Same as Pre-procedure Electronic Signature(s) Signed: 08/14/2019 11:39:54 AM By: Army Melia Signed: 08/14/2019 5:05:52 PM By: Worthy Keeler PA-C Entered By: Army Melia on 08/14/2019 09:06:08 Timothy Gibson ([id number]) -------------------------------------------------------------------------------- HPI Details Patient Name: Timothy Gibson Date of Service: 08/14/2019 8:45 AM Medical Record Number: [id number] Patient Account Number: [id number] Date of Birth/Sex: [birth year] (69 y.o. M) Treating RN: Army Melia Primary Care Provider: Merrie Roof Other Clinician: Referring Provider: Merrie Roof Treating Provider/Extender: Melburn Hake, Kenetra Hildenbrand Weeks in Treatment: 4 History of Present Illness HPI Description: 11/27/17 on evaluation today patient presents for initial evaluation concerning an issue that he has been having with his right great toe which began last Thursday 11/22/17. He has a history of diabetes mellitus type to which he has had for greater than 30 years currently he is on insulin. Subsequently he also has a history of hypertension. On physical exam inspection is also appears he may have some peripheral vascular disease with his ABI's being noncompressible bilaterally. He is on dialysis as well and this is on Monday, Wednesday, and Friday. Patient was hospitalized back in January/February 2019 although this was more for stomach/back pain though they never told me exactly what was going on. This is according to the patient. Subsequently the ulcer which is between the third and fourth toe when space of the right foot as well is on the plantar aspect of the fourth toe is due to him having cleaned his toes this  morning and he states that his finger which is large split the toe tissue in between causing the wounds that we currently see. With that being said he states this has happened before I explained I would definitely recommend that he not do this any longer. He can use a small soft washcloth to clean between the toes without causing trauma. Subsequently  the right great toe hatchery does show evidence of necrotic tissue present on the surface of the wound specifically there is callous and dead skin surrounding which is trapping fluid causing problems as far as the wound is concerned. The surface of the wound does show slough although due to his vascular flow I'm not going to sharply debride this today I think I will selectively debride away the necrotic skin as well is callous from around the surrounding so this will not continue to be a moisture issue. Nonetheless the patient has no pain he does have diabetic neuropathy. 12/06/17-He is here in follow-up evaluation for a right great toe ulcer. He is voicing no complaints or concerns. He continues to infrequently/socially smokes cigars with no desire to quit. He has been compliant with offloading, using open toed surgical shoe; has been compliant using Santyl daily. He continues on clindamycin although takes it inconsistently secondary to indigestion. The plain film x-ray performed on 5/23 impression: Soft tissue swelling of the left first toe and is noted with probable irregular lucency involving distal tuft of first distal phalanx concerning for osteomyelitis. MRI may be performed for further evaluation; MRI ordered. The vascular evaluation performed on 5/24 field non-compressible ABIs bilaterally and reduced TBI bilaterally suggesting significant tibial disease. He will be referred to vascular medicine for further evaluation. 12/13/17-He is here in follow-up evaluation for right great toe ulcer. He has appointment next Thursday for the MRI and  vascular evaluation. Wound culture obtained last week grew abundant Klebsiella oxytoca (multidrug sensitivity), and abundant enteric coccus faecalis (sensitive to ampicillin). Ampicillin and Cipro were called in, along with a probiotic. In light of his appointments next Thursday he will follow-up in 2 weeks, we will continue with Santyl 12/27/17-He is here for evaluation for a right great toe ulcer. 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 He did have evaluation by vascular medicine, Dr. Ronalee Belts, on 6/13, at that appointment it was decided for him to undergo angiography with possible intervention but he refused and is still considering. If he chooses to have it done he will contact the office. He has not heard back from either ID offices that he was referred to two weeks ago: [place] and [place]. there is improvement in both appearance and measurement to the ulcer. We will extend antibiotic therapy (amoxicillin 500 every 12, Cipro 500 daily) for additional 2 weeks pending infectious disease consult, he will continue with Santyl daily. He was reminded to continue with probiotic therapy while on oral antibiotics; he denies any GI disturbance. 01/03/18-He is here in follow-up evaluation for right great toe ulcer. He is decided to not undergo any vascular intervention at this time. He was established with [facility] infectious disease (Dr. Linus Salmons) last week initiated on vancomycin and cefazolin with dialysis treatment for 6 weeks. We will continue with current treatment plan with Santyl and offloading and he will follow- up in 2 weeks 01/17/18-He is seen in follow-up evaluation for right great toe ulcer. There is improvement in appearance with less nonviable tissue present. We will continue with same treatment plan he will follow-up next week. He is tolerating IV antibiotics without any complications 1/66/06-TK is seen in  follow up evaluation for right great toe ulcer. There continues to be improvement. He is tolerating IV antibiotics with hemodialysis, completion date of 7/31. We will transition to collagen and and follow-up next week 02/07/18-He is here in follow up for a right  great toe ulcer. There is red granulation tissue throughout the wound, improved in appearance. He completed antibiotics yesterday. He saw podiatry last Thursday for nail trimming, at that appointment he periwound callus was trimmed and a culture was obtained; culture negative. He does not have a follow up appointment with ROCKNEY, GRENZ. ([id number]) infectious disease. We will submit for grafix. 02/14/18-He is here in follow-up evaluation for a right great toe ulcer. There is slow improvement, red granulation tissue throughout. The insurance approval for grafix is pending. We will continue with collagen and he'll follow-up next week 02/21/18-He is seen in follow-up evaluation for right great toe ulcer. There is improvement with red granulation tissue throughout. He was approved for oasis and this was placed today. He will follow up next week 02/28/18-He is seen in follow-up evaluation for right great toe ulcer. The wound is stable, oasis applied and he'll follow-up next week 03/07/18-He is seen in follow-up violation of her right great toe ulcer. The wound is dry, no evidence of drainage. The wound appears healed today. He was painted with Betadine, instructed to paint with Betadine daily, cover with band-aid for the next week and then cover with band-aid for the following week continue with surgical shoe. He was encouraged to contact the clinic with any evidence of drainage, or change in appearance to toe/wound. He will be discharged from wound care services Readmission: 07/14/18 on evaluation today patient presents for initial evaluation our clinic concerning issues that he is having with his left great toe. We previously saw him in 2019 for  his right great toe which was actually worse at that time and subsequently was able to be completely healed in the end although it did take several months. With that being said the patient is currently having an area on his left great toe that occurred initially as result of a blister that came up he's not really sure why or how. Subsequently this was initially treated by his podiatrist though the recommendation there was apparently amputation according to the patient and his daughter who was present with him today. With that being said the patient did not want to proceed with amputation and would like to try to perform wound care and get this to heal without having to go down that road. We were able to do that with his other toe and so he would like to at least give that a try before going forward with amputation. Again I completely understand his concern in this regard. With that being said I did discuss with the patient obviously that it's always a chance that we cannot get this to heal with normal wound care measures but obviously we will give it our best shot. He does actually have an appointment scheduled for later today with the vascular specialist in order to evaluate his blood flow. That was at the recommendation of his podiatrist as well. Obviously if he is having good arterial flow and nonetheless is still experiencing issues with having the wound delay in healing then it may simply be a matter of we need to initiate more aggressive wound to therapy in order to see if we can get this to heal. No fevers, chills, nausea, or vomiting noted at this time. The patient notes that this woman has been present for approximately three weeks. He is not a smoker. His most recent hemoglobin A1c was 6.2 on 06/18/19. 07/17/2019 on evaluation today patient appears unfortunately to not be doing as well. He did see vascular I  did review the note as well. That was on 07/15/2019. Subsequently there can actually be  taking him to the OR for an angiogram due to poor arterial flow into this extremity. Apparently the atherosclerotic changes were severe. The patient is stated to be at risk for limb loss. Nonetheless the good news is this is being scheduled fairly quickly he will have the surgery/procedure next Thursday. With that being said I am going also go ahead based on the results of his x-ray which showed some chance of osteomyelitis in the distal portion of the toe get the patient set up for an MRI to further evaluate for the possibility of osteomyelitis obviously if he does have osteomyelitis we need to know so that we can address this appropriately. 07/29/2019 upon evaluation today patient appears to be doing better in general with regard to his toe ulcer. He does have much better blood flow you can actually feel a palpable pulse which appears to be very strong at this time. I did review his arterial intervention/angiogram which did show that he had evidence of sufficient arterial flow restoration at this point. He did have occlusions that were 60 and 80% that residually were only 10 and 25% with no limiting flow. This is excellent news and hopefully he will continue to show signs of improvement in light of the improved vascular status. With that being said the toe mainly shows somewhat of an eschar today I think that this is fairly stable and as such probably would be best not to really do much in the way of debridement at this point I do believe Betadine would likely be a good option. 08/07/2019 on evaluation today patient actually appears to be doing about the same. The one difference is the eschar that we were just seeing if we can maintain is actually starting to loosen up and leak around the edges I think it is time to actually go ahead and remove this today. Is also no longer stable is very soft. Nonetheless the patient is okay with proceeding with debridement at this point. Fortunately there is no  signs of active infection. No fevers, chills, nausea, vomiting, or diarrhea. The patient states that he took his last antibiotic that he had last night. He would need a refill as he will not be seeing infectious disease until February 2. Nonetheless obviously I do not want him to have any break in therapy especially when he is doing as well as he is currently. 08/14/2019 on evaluation today patient appears to be doing quite well all things considered in regard to his toe. I do feel like he is making progress and though this is not completely healed by any means we still have some ways to go I feel like he is making progress which is excellent. There is no sign of active infection at this time systemically. He does have osteomyelitis of the distal phalanx of his great toe on the left. He did see Dr. Linus Salmons. That is the infectious disease doctor in [place] where he was referred. Dr. Linus Salmons apparently according to the note did not feel like the patient's MRI revealed osteomyelitis Timothy Gibson, Timothy Gibson. ([id number]) but just reactive marrow and subsequently did not recommend any antibiotic therapy whatsoever. To be peripherally honest I am not really in agreement with this based on the MRI results and what we are seeing and I think he has been responding at least decently well to oral medication as well. Nonetheless we may want to check into  the possibility of IV antibiotics being administered at the dialysis center. I will discuss this with Dr. Dellia Nims further. Electronic Signature(s) Signed: 08/14/2019 5:03:40 PM By: Worthy Keeler PA-C Entered By: Worthy Keeler on 08/14/2019 17:03:40 Timothy Gibson, Timothy Gibson ([id number]) -------------------------------------------------------------------------------- Physical Exam Details Patient Name: Timothy Gibson Date of Service: 08/14/2019 8:45 AM Medical Record Number: [id number] Patient Account Number: [id number] Date of Birth/Sex: [birth year] (69 y.o. M) Treating  RN: Army Melia Primary Care Provider: Merrie Roof Other Clinician: Referring Provider: Merrie Roof Treating Provider/Extender: [name], Jasmeen Fritsch Weeks in Treatment: 4 Constitutional Well-nourished and well-hydrated in no acute distress. Respiratory normal breathing without difficulty. Psychiatric this patient is able to make decisions and demonstrates good insight into disease process. Alert and Oriented x 3. pleasant and cooperative. Notes Patient's wound bed currently did require some sharp debridement to remove necrotic debris from the surface of the wound he tolerated this without complication he does seem to have much better perfusion and blood flow and overall very pleased and can continue with the Iodoflex however for now to try to help continue to clean this wound bed up and get a lot healthier than what it is currently. Electronic Signature(s) Signed: 08/14/2019 5:04:00 PM By: Worthy Keeler PA-C Entered By: Worthy Keeler on 08/14/2019 17:04:00 Timothy Gibson ([id number]) -------------------------------------------------------------------------------- Physician Orders Details Patient Name: Timothy Gibson Date of Service: 08/14/2019 8:45 AM Medical Record Number: [id number] Patient Account Number: [id number] Date of Birth/Sex: 1951/06/21 (68 y.o. M) Treating RN: Army Melia Primary Care Provider: Merrie Roof Other Clinician: Referring Provider: Merrie Roof Treating Provider/Extender: Melburn Hake, Deanna Wiater Weeks in Treatment: 4 Verbal / Phone Orders: No Diagnosis Coding ICD-10 Coding Code Description [phone] Other chronic osteomyelitis, left ankle and foot E11.621 Type 2 diabetes mellitus with foot ulcer L97.522 Non-pressure chronic ulcer of other part of left foot with fat layer exposed I10 Essential (primary) hypertension Wound Cleansing Wound #3 Left Toe Great o Dial antibacterial soap, wash wounds, rinse and pat dry prior to dressing wounds o May Shower,  gently pat wound dry prior to applying new dressing. Anesthetic (add to Medication List) Wound #3 Left Toe Great o Topical Lidocaine 4% cream applied to wound bed prior to debridement (In Clinic Only). Primary Wound Dressing Wound #3 Left Toe Great o Iodoflex Secondary Dressing Wound #3 Left Toe Great o Dry Gauze o Conform/Kerlix Dressing Change Frequency Wound #3 Left Toe Great o Change dressing every day. Follow-up Appointments Wound #3 Left Toe Great o Return Appointment in 1 week. Electronic Signature(s) Signed: 08/14/2019 11:39:54 AM By: Army Melia Signed: 08/14/2019 5:05:52 PM By: Worthy Keeler PA-C Entered By: Army Melia on 08/14/2019 09:07:37 Timothy Gibson, Timothy Gibson ([id number]) -------------------------------------------------------------------------------- Problem List Details Patient Name: Timothy Gibson Date of Service: 08/14/2019 8:45 AM Medical Record Number: [id number] Patient Account Number: [id number] Date of Birth/Sex: 30-Jan-1951 (69 y.o. M) Treating RN: Army Melia Primary Care Provider: Merrie Roof Other Clinician: Referring Provider: Merrie Roof Treating Provider/Extender: Melburn Hake, Zahmir Lalla Weeks in Treatment: 4 Active Problems ICD-10 Evaluated Encounter Code Description Active Date Today Diagnosis [phone] Other chronic osteomyelitis, left ankle and foot 07/29/2019 No Yes E11.621 Type 2 diabetes mellitus with foot ulcer 07/15/2019 No Yes L97.522 Non-pressure chronic ulcer of other part of left foot with fat 07/15/2019 No Yes layer exposed I10 Essential (primary) hypertension 07/15/2019 No Yes Inactive Problems Resolved Problems Electronic Signature(s) Signed: 08/14/2019 8:52:59 AM By: Worthy Keeler PA-C Entered By: Worthy Keeler on 08/14/2019 08:52:59 Timothy Gibson ([id number]) --------------------------------------------------------------------------------  Progress Note Details Patient Name: Timothy Gibson, Timothy Gibson Date of Service: 08/14/2019  8:45 AM Medical Record Number: [id number] Patient Account Number: [id number] Date of Birth/Sex: 1951/06/27 (69 y.o. M) Treating RN: Army Melia Primary Care Provider: Merrie Roof Other Clinician: Referring Provider: Merrie Roof Treating Provider/Extender: Melburn Hake, Ira Dougher Weeks in Treatment: 4 Subjective Chief Complaint Information obtained from Patient Left Great Toe History of Present Illness (HPI) 11/27/17 on evaluation today patient presents for initial evaluation concerning an issue that he has been having with his right great toe which began last Thursday 11/22/17. He has a history of diabetes mellitus type to which he has had for greater than 30 years currently he is on insulin. Subsequently he also has a history of hypertension. On physical exam inspection is also appears he may have some peripheral vascular disease with his ABI's being noncompressible bilaterally. He is on dialysis as well and this is on Monday, Wednesday, and Friday. Patient was hospitalized back in January/February 2019 although this was more for stomach/back pain though they never told me exactly what was going on. This is according to the patient. Subsequently the ulcer which is between the third and fourth toe when space of the right foot as well is on the plantar aspect of the fourth toe is due to him having cleaned his toes this morning and he states that his finger which is large split the toe tissue in between causing the wounds that we currently see. With that being said he states this has happened before I explained I would definitely recommend that he not do this any longer. He can use a small soft washcloth to clean between the toes without causing trauma. Subsequently the right great toe hatchery does show evidence of necrotic tissue present on the surface of the wound specifically there is callous and dead skin surrounding which is trapping fluid causing problems as far as the wound is concerned. The  surface of the wound does show slough although due to his vascular flow I'm not going to sharply debride this today I think I will selectively debride away the necrotic skin as well is callous from around the surrounding so this will not continue to be a moisture issue. Nonetheless the patient has no pain he does have diabetic neuropathy. 12/06/17-He is here in follow-up evaluation for a right great toe ulcer. He is voicing no complaints or concerns. He continues to infrequently/socially smokes cigars with no desire to quit. He has been compliant with offloading, using open toed surgical shoe; has been compliant using Santyl daily. He continues on clindamycin although takes it inconsistently secondary to indigestion. The plain film x-ray performed on 5/23 impression: Soft tissue swelling of the left first toe and is noted with probable irregular lucency involving distal tuft of first distal phalanx concerning for osteomyelitis. MRI may be performed for further evaluation; MRI ordered. The vascular evaluation performed on 5/24 field non-compressible ABIs bilaterally and reduced TBI bilaterally suggesting significant tibial disease. He will be referred to vascular medicine for further evaluation. 12/13/17-He is here in follow-up evaluation for right great toe ulcer. He has appointment next Thursday for the MRI and vascular evaluation. Wound culture obtained last week grew abundant Klebsiella oxytoca (multidrug sensitivity), and abundant enteric coccus faecalis (sensitive to ampicillin). Ampicillin and Cipro were called in, along with a probiotic. In light of his appointments next Thursday he will follow-up in 2 weeks, we will continue with Santyl 12/27/17-He is here for evaluation for a right great toe ulcer. MRI  performed on 6/13 revealed osteomyelitis at the distal phalanx of the great toe, negative for abscess or septic joint. He did have evaluation by vascular medicine, Dr. Ronalee Belts, on 6/13, at that  appointment it was decided for him to undergo angiography with possible intervention but he refused and is still considering. If he chooses to have it done he will contact the office. He has not heard back from either ID offices that he was referred to two weeks ago: [place] and [place]. there is improvement in both appearance and measurement to the ulcer. We will extend antibiotic therapy (amoxicillin 500 every 12, Cipro 500 daily) for additional 2 weeks pending infectious disease consult, he will continue with Santyl daily. He was reminded to continue with probiotic therapy while on oral antibiotics; he denies any GI disturbance. 01/03/18-He is here in follow-up evaluation for right great toe ulcer. He is decided to not undergo any vascular intervention at this time. He was established with [facility] infectious disease (Dr. Linus Salmons) last week initiated on vancomycin and cefazolin with dialysis treatment for 6 weeks. We will continue with current treatment plan with Santyl and offloading and he will follow- up in 2 weeks 01/17/18-He is seen in follow-up evaluation for right great toe ulcer. There is improvement in appearance with less nonviable tissue present. We will continue with same treatment plan he will follow-up next week. He is tolerating IV antibiotics without Timothy Gibson, Timothy Gibson. ([id number]) any complications 5/63/14-HF is seen in follow up evaluation for right great toe ulcer. There continues to be improvement. He is tolerating IV antibiotics with hemodialysis, completion date of 7/31. We will transition to collagen and and follow-up next week 02/07/18-He is here in follow up for a right great toe ulcer. There is red granulation tissue throughout the wound, improved in appearance. He completed antibiotics yesterday. He saw podiatry last Thursday for nail trimming, at that appointment he periwound callus was trimmed and a culture was obtained; culture negative. He does not have a follow up  appointment with infectious disease. We will submit for grafix. 02/14/18-He is here in follow-up evaluation for a right great toe ulcer. There is slow improvement, red granulation tissue throughout. The insurance approval for grafix is pending. We will continue with collagen and he'll follow-up next week 02/21/18-He is seen in follow-up evaluation for right great toe ulcer. There is improvement with red granulation tissue throughout. He was approved for oasis and this was placed today. He will follow up next week 02/28/18-He is seen in follow-up evaluation for right great toe ulcer. The wound is stable, oasis applied and he'll follow-up next week 03/07/18-He is seen in follow-up violation of her right great toe ulcer. The wound is dry, no evidence of drainage. The wound appears healed today. He was painted with Betadine, instructed to paint with Betadine daily, cover with band-aid for the next week and then cover with band-aid for the following week continue with surgical shoe. He was encouraged to contact the clinic with any evidence of drainage, or change in appearance to toe/wound. He will be discharged from wound care services Readmission: 07/14/18 on evaluation today patient presents for initial evaluation our clinic concerning issues that he is having with his left great toe. We previously saw him in 2019 for his right great toe which was actually worse at that time and subsequently was able to be completely healed in the end although it did take several months. With that being said the patient is currently having an area on his left  great toe that occurred initially as result of a blister that came up he's not really sure why or how. Subsequently this was initially treated by his podiatrist though the recommendation there was apparently amputation according to the patient and his daughter who was present with him today. With that being said the patient did not want to proceed with amputation and  would like to try to perform wound care and get this to heal without having to go down that road. We were able to do that with his other toe and so he would like to at least give that a try before going forward with amputation. Again I completely understand his concern in this regard. With that being said I did discuss with the patient obviously that it's always a chance that we cannot get this to heal with normal wound care measures but obviously we will give it our best shot. He does actually have an appointment scheduled for later today with the vascular specialist in order to evaluate his blood flow. That was at the recommendation of his podiatrist as well. Obviously if he is having good arterial flow and nonetheless is still experiencing issues with having the wound delay in healing then it may simply be a matter of we need to initiate more aggressive wound to therapy in order to see if we can get this to heal. No fevers, chills, nausea, or vomiting noted at this time. The patient notes that this woman has been present for approximately three weeks. He is not a smoker. His most recent hemoglobin A1c was 6.2 on 06/18/19. 07/17/2019 on evaluation today patient appears unfortunately to not be doing as well. He did see vascular I did review the note as well. That was on 07/15/2019. Subsequently there can actually be taking him to the OR for an angiogram due to poor arterial flow into this extremity. Apparently the atherosclerotic changes were severe. The patient is stated to be at risk for limb loss. Nonetheless the good news is this is being scheduled fairly quickly he will have the surgery/procedure next Thursday. With that being said I am going also go ahead based on the results of his x-ray which showed some chance of osteomyelitis in the distal portion of the toe get the patient set up for an MRI to further evaluate for the possibility of osteomyelitis obviously if he does have osteomyelitis we need  to know so that we can address this appropriately. 07/29/2019 upon evaluation today patient appears to be doing better in general with regard to his toe ulcer. He does have much better blood flow you can actually feel a palpable pulse which appears to be very strong at this time. I did review his arterial intervention/angiogram which did show that he had evidence of sufficient arterial flow restoration at this point. He did have occlusions that were 60 and 80% that residually were only 10 and 25% with no limiting flow. This is excellent news and hopefully he will continue to show signs of improvement in light of the improved vascular status. With that being said the toe mainly shows somewhat of an eschar today I think that this is fairly stable and as such probably would be best not to really do much in the way of debridement at this point I do believe Betadine would likely be a good option. 08/07/2019 on evaluation today patient actually appears to be doing about the same. The one difference is the eschar that we were just seeing if we  can maintain is actually starting to loosen up and leak around the edges I think it is time to actually go ahead and remove this today. Is also no longer stable is very soft. Nonetheless the patient is okay with proceeding with debridement at this point. Fortunately there is no signs of active infection. No fevers, chills, nausea, vomiting, or diarrhea. The patient states that he took his last antibiotic that he had last night. He would need a refill as he will not be seeing infectious disease until February 2. Nonetheless obviously I do not want him to have any break in therapy especially when he is doing as well as he is currently. Timothy Gibson, Timothy Gibson ([id number]) 08/14/2019 on evaluation today patient appears to be doing quite well all things considered in regard to his toe. I do feel like he is making progress and though this is not completely healed by any means we  still have some ways to go I feel like he is making progress which is excellent. There is no sign of active infection at this time systemically. He does have osteomyelitis of the distal phalanx of his great toe on the left. He did see Dr. Linus Salmons. That is the infectious disease doctor in [place] where he was referred. Dr. Linus Salmons apparently according to the note did not feel like the patient's MRI revealed osteomyelitis but just reactive marrow and subsequently did not recommend any antibiotic therapy whatsoever. To be peripherally honest I am not really in agreement with this based on the MRI results and what we are seeing and I think he has been responding at least decently well to oral medication as well. Nonetheless we may want to check into the possibility of IV antibiotics being administered at the dialysis center. I will discuss this with Dr. Dellia Nims further. Objective Constitutional Well-nourished and well-hydrated in no acute distress. Vitals Time Taken: 8:40 AM, Height: 76 in, Weight: 244 lbs, BMI: 29.7, Temperature: 97.4 F, Pulse: 90 bpm, Respiratory Rate: 16 breaths/min, Blood Pressure: 132/62 mmHg. Respiratory normal breathing without difficulty. Psychiatric this patient is able to make decisions and demonstrates good insight into disease process. Alert and Oriented x 3. pleasant and cooperative. General Notes: Patient's wound bed currently did require some sharp debridement to remove necrotic debris from the surface of the wound he tolerated this without complication he does seem to have much better perfusion and blood flow and overall very pleased and can continue with the Iodoflex however for now to try to help continue to clean this wound bed up and get a lot healthier than what it is currently. Integumentary (Hair, Skin) Wound #3 status is Open. Original cause of wound was Gradually Appeared. The wound is located on the Left Toe Great. The wound measures 3.1cm length x 1.8cm  width x 0.1cm depth; 4.383cm^2 area and 0.438cm^3 volume. There is Fat Layer (Subcutaneous Tissue) Exposed exposed. There is no tunneling or undermining noted. There is a medium amount of serous drainage noted. The wound margin is flat and intact. There is small (1-33%) pink granulation within the wound bed. There is a large (67-100%) amount of necrotic tissue within the wound bed including Eschar. Assessment Active Problems ICD-10 Other chronic osteomyelitis, left ankle and foot Type 2 diabetes mellitus with foot ulcer Timothy Gibson, Timothy Gibson. ([id number]) Non-pressure chronic ulcer of other part of left foot with fat layer exposed Essential (primary) hypertension Procedures Wound #3 Pre-procedure diagnosis of Wound #3 is a Diabetic Wound/Ulcer of the Lower Extremity located on the Left  Toe Great .Severity of Tissue Pre Debridement is: Fat layer exposed. There was a Excisional Skin/Subcutaneous Tissue Debridement with a total area of 5.58 sq cm performed by [name], Aziz Slape E., PA-C. With the following instrument(s): Curette to remove Viable and Non-Viable tissue/material. Material removed includes Callus, Subcutaneous Tissue, and Slough after achieving pain control using Lidocaine. A time out was conducted at 09:05, prior to the start of the procedure. A Minimum amount of bleeding was controlled with Pressure. The procedure was tolerated well. Post Debridement Measurements: 3.1cm length x 1.8cm width x 0.1cm depth; 0.438cm^3 volume. Character of Wound/Ulcer Post Debridement is stable. Severity of Tissue Post Debridement is: Fat layer exposed. Post procedure Diagnosis Wound #3: Same as Pre-Procedure Plan Wound Cleansing: Wound #3 Left Toe Great: Dial antibacterial soap, wash wounds, rinse and pat dry prior to dressing wounds May Shower, gently pat wound dry prior to applying new dressing. Anesthetic (add to Medication List): Wound #3 Left Toe Great: Topical Lidocaine 4% cream applied to wound  bed prior to debridement (In Clinic Only). Primary Wound Dressing: Wound #3 Left Toe Great: Iodoflex Secondary Dressing: Wound #3 Left Toe Great: Dry Gauze Conform/Kerlix Dressing Change Frequency: Wound #3 Left Toe Great: Change dressing every day. Follow-up Appointments: Wound #3 Left Toe Great: Return Appointment in 1 week. 1. My suggestion currently is can be that we continue with the Iodoflex dressing I still think that is probably best we may switch him next week we will see how things appear. 2. I am also going to suggest that we continue with the postop surgical shoe to keep pressure off of the toe I still think that is appropriate as well. 3. I am going to look into whether we can initiate IV antibiotic therapy at dialysis for the patient I think this may be a possibility we will see what we can work out number to discuss this with Dr. Dellia Nims as well. Timothy Gibson, Timothy Gibson ([id number]) 4. I also am going to recommend potentially that we proceed with authorization for hyperbaric oxygen therapy next week if were not seeing a significant improvement compared to this week I think that could be beneficial in getting this to heal more effectively and quickly as well and prevent amputation. We will see patient back for reevaluation in 1 week here in the clinic. If anything worsens or changes patient will contact our office for additional recommendations. Electronic Signature(s) Signed: 08/14/2019 5:05:08 PM By: Worthy Keeler PA-C Entered By: Worthy Keeler on 08/14/2019 17:05:08 Timothy Gibson, Timothy Gibson ([id number]) -------------------------------------------------------------------------------- SuperBill Details Patient Name: Timothy Gibson Date of Service: 08/14/2019 Medical Record Number: [id number] Patient Account Number: [id number] Date of Birth/Sex: 1951/03/14 (68 y.o. M) Treating RN: Army Melia Primary Care Provider: Merrie Roof Other Clinician: Referring Provider: Merrie Roof Treating Provider/Extender: Melburn Hake, Janeka Libman Weeks in Treatment: 4 Diagnosis Coding ICD-10 Codes Code Description [phone] Other chronic osteomyelitis, left ankle and foot E11.621 Type 2 diabetes mellitus with foot ulcer L97.522 Non-pressure chronic ulcer of other part of left foot with fat layer exposed I10 Essential (primary) hypertension Facility Procedures CPT4 Code: [id number] Description: 45809 - DEB SUBQ TISSUE 20 SQ CM/< ICD-10 Diagnosis Description L97.522 Non-pressure chronic ulcer of other part of left foot with fat Modifier: layer exposed Quantity: 1 Physician Procedures CPT4 Code: 9833825 Description: 11042 - WC PHYS SUBQ TISS 20 SQ CM ICD-10 Diagnosis Description L97.522 Non-pressure chronic ulcer of other part of left foot with fat Modifier: layer exposed Quantity: 1 Electronic Signature(s) Signed: 08/14/2019 5:05:24 PM  By: Worthy Keeler PA-C Entered By: Worthy Keeler on 08/14/2019 17:05:24

## 2019-08-15 DIAGNOSIS — Z992 Dependence on renal dialysis: Secondary | ICD-10-CM | POA: Diagnosis not present

## 2019-08-15 DIAGNOSIS — D509 Iron deficiency anemia, unspecified: Secondary | ICD-10-CM | POA: Diagnosis not present

## 2019-08-15 DIAGNOSIS — N2581 Secondary hyperparathyroidism of renal origin: Secondary | ICD-10-CM | POA: Diagnosis not present

## 2019-08-15 DIAGNOSIS — D631 Anemia in chronic kidney disease: Secondary | ICD-10-CM | POA: Diagnosis not present

## 2019-08-15 DIAGNOSIS — N186 End stage renal disease: Secondary | ICD-10-CM | POA: Diagnosis not present

## 2019-08-18 DIAGNOSIS — D509 Iron deficiency anemia, unspecified: Secondary | ICD-10-CM | POA: Diagnosis not present

## 2019-08-18 DIAGNOSIS — N186 End stage renal disease: Secondary | ICD-10-CM | POA: Diagnosis not present

## 2019-08-18 DIAGNOSIS — Z992 Dependence on renal dialysis: Secondary | ICD-10-CM | POA: Diagnosis not present

## 2019-08-18 DIAGNOSIS — N2581 Secondary hyperparathyroidism of renal origin: Secondary | ICD-10-CM | POA: Diagnosis not present

## 2019-08-18 DIAGNOSIS — D631 Anemia in chronic kidney disease: Secondary | ICD-10-CM | POA: Diagnosis not present

## 2019-08-20 DIAGNOSIS — N2581 Secondary hyperparathyroidism of renal origin: Secondary | ICD-10-CM | POA: Diagnosis not present

## 2019-08-20 DIAGNOSIS — D509 Iron deficiency anemia, unspecified: Secondary | ICD-10-CM | POA: Diagnosis not present

## 2019-08-20 DIAGNOSIS — D631 Anemia in chronic kidney disease: Secondary | ICD-10-CM | POA: Diagnosis not present

## 2019-08-20 DIAGNOSIS — Z992 Dependence on renal dialysis: Secondary | ICD-10-CM | POA: Diagnosis not present

## 2019-08-20 DIAGNOSIS — N186 End stage renal disease: Secondary | ICD-10-CM | POA: Diagnosis not present

## 2019-08-21 ENCOUNTER — Other Ambulatory Visit: Payer: Self-pay

## 2019-08-21 ENCOUNTER — Encounter: Payer: Medicare Other | Admitting: Physician Assistant

## 2019-08-21 DIAGNOSIS — E1122 Type 2 diabetes mellitus with diabetic chronic kidney disease: Secondary | ICD-10-CM | POA: Diagnosis not present

## 2019-08-21 DIAGNOSIS — J449 Chronic obstructive pulmonary disease, unspecified: Secondary | ICD-10-CM | POA: Diagnosis not present

## 2019-08-21 DIAGNOSIS — I5032 Chronic diastolic (congestive) heart failure: Secondary | ICD-10-CM | POA: Diagnosis not present

## 2019-08-21 DIAGNOSIS — M069 Rheumatoid arthritis, unspecified: Secondary | ICD-10-CM | POA: Diagnosis not present

## 2019-08-21 DIAGNOSIS — I739 Peripheral vascular disease, unspecified: Secondary | ICD-10-CM | POA: Diagnosis not present

## 2019-08-21 DIAGNOSIS — E11621 Type 2 diabetes mellitus with foot ulcer: Secondary | ICD-10-CM | POA: Diagnosis not present

## 2019-08-21 DIAGNOSIS — N186 End stage renal disease: Secondary | ICD-10-CM | POA: Diagnosis not present

## 2019-08-21 DIAGNOSIS — L97524 Non-pressure chronic ulcer of other part of left foot with necrosis of bone: Secondary | ICD-10-CM | POA: Diagnosis not present

## 2019-08-21 DIAGNOSIS — I120 Hypertensive chronic kidney disease with stage 5 chronic kidney disease or end stage renal disease: Secondary | ICD-10-CM | POA: Diagnosis not present

## 2019-08-21 DIAGNOSIS — L97522 Non-pressure chronic ulcer of other part of left foot with fat layer exposed: Secondary | ICD-10-CM | POA: Diagnosis not present

## 2019-08-21 DIAGNOSIS — R630 Anorexia: Secondary | ICD-10-CM | POA: Diagnosis not present

## 2019-08-21 DIAGNOSIS — E1165 Type 2 diabetes mellitus with hyperglycemia: Secondary | ICD-10-CM | POA: Diagnosis not present

## 2019-08-21 DIAGNOSIS — I159 Secondary hypertension, unspecified: Secondary | ICD-10-CM | POA: Diagnosis not present

## 2019-08-21 NOTE — Progress Notes (Signed)
DEYONTE, CADDEN ([id number]) Visit Report for 08/21/2019 Arrival Information Details Patient Name: Timothy Gibson, Timothy Gibson Date of Service: 08/21/2019 1:00 PM Medical Record Number: [id number] Patient Account Number: [id number] Date of Birth/Sex: 08/22/1950 (69 y.o. M) Treating RN: Army Melia Primary Care Erle Guster: Merrie Roof Other Clinician: Referring Selso Mannor: Merrie Roof Treating Weston Fulco/Extender: Melburn Hake, Timothy Gibson Weeks in Treatment: 5 Visit Information History Since Last Visit Added or deleted any medications: No Patient Arrived: Ambulatory Any new allergies or adverse reactions: No Arrival Time: 12:55 Had a fall or experienced change in No Accompanied By: caregiver activities of daily living that may affect Transfer Assistance: None risk of falls: Patient Identification Verified: Yes Signs or symptoms of abuse/neglect since last visito No Secondary Verification Process Completed: Yes Hospitalized since last visit: No Implantable device outside of the clinic excluding No cellular tissue based products placed in the center since last visit: Has Dressing in Place as Prescribed: Yes Pain Present Now: No Electronic Signature(s) Signed: 08/21/2019 4:36:47 PM By: Lorine Bears RCP, RRT, CHT Entered By: Lorine Bears on 08/21/2019 12:57:09 Timothy Gibson ([id number]) -------------------------------------------------------------------------------- Encounter Discharge Information Details Patient Name: Timothy Gibson Date of Service: 08/21/2019 1:00 PM Medical Record Number: [id number] Patient Account Number: [id number] Date of Birth/Sex: 07/17/1950 (68 y.o. M) Treating RN: Army Melia Primary Care Sidharth Leverette: Merrie Roof Other Clinician: Referring Sari Cogan: Merrie Roof Treating Timothy Gibson/Extender: Melburn Hake, Timothy Gibson Weeks in Treatment: 5 Encounter Discharge Information Items Post Procedure Vitals Discharge Condition: Stable Temperature (F):  97.8 Ambulatory Status: Ambulatory Pulse (bpm): 113 Discharge Destination: Home Respiratory Rate (breaths/min): 16 Transportation: Private Auto Blood Pressure (mmHg): 166/73 Accompanied By: family Schedule Follow-up Appointment: Yes Clinical Summary of Care: Electronic Signature(s) Signed: 08/21/2019 4:09:28 PM By: Army Melia Entered By: Army Melia on 08/21/2019 14:00:40 Timothy Gibson ([id number]) -------------------------------------------------------------------------------- Lower Extremity Assessment Details Patient Name: Timothy Gibson Date of Service: 08/21/2019 1:00 PM Medical Record Number: [id number] Patient Account Number: [id number] Date of Birth/Sex: 1951-01-19 (68 y.o. M) Treating RN: Army Melia Primary Care Gerado Nabers: Merrie Roof Other Clinician: Referring Elmire Amrein: Merrie Roof Treating Jamariya Davidoff/Extender: Timothy Gibson, Timothy Gibson Weeks in Treatment: 5 Edema Assessment Assessed: [Left: No] [Right: No] Edema: [Left: Ye] [Right: s] Vascular Assessment Pulses: Dorsalis Pedis Palpable: [Left:Yes] Electronic Signature(s) Signed: 08/21/2019 4:09:28 PM By: Army Melia Entered By: Army Melia on 08/21/2019 13:12:40 Timothy Gibson ([id number]) -------------------------------------------------------------------------------- Multi Wound Chart Details Patient Name: Timothy Gibson Date of Service: 08/21/2019 1:00 PM Medical Record Number: [id number] Patient Account Number: [id number] Date of Birth/Sex: 04-Aug-1950 (69 y.o. M) Treating RN: Army Melia Primary Care Leeland Lovelady: Merrie Roof Other Clinician: Referring [place] Mielke: Merrie Roof Treating Tayte Mcwherter/Extender: Melburn Hake, Timothy Gibson Weeks in Treatment: 5 Vital Signs Height(in): 76 Pulse(bpm): 113 Weight(lbs): 244 Blood Pressure(mmHg): 166/73 Body Mass Index(BMI): 30 Temperature(F): 97.8 Respiratory Rate 16 (breaths/min): Photos: [N/A:N/A] Wound Location: Left Toe Great N/A N/A Wounding Event: Gradually  Appeared N/A N/A Primary Etiology: Diabetic Wound/Ulcer of the N/A N/A Lower Extremity Comorbid History: Lymphedema, Arrhythmia, N/A N/A Hypertension, Type II Diabetes, End Stage Renal Disease, Rheumatoid Arthritis, Osteoarthritis, Confinement Anxiety Date Acquired: 06/19/2019 N/A N/A Weeks of Treatment: 5 N/A N/A Wound Status: Open N/A N/A Measurements L x W x D 1.6x2.8x0.1 N/A N/A (cm) Area (cm) : 3.519 N/A N/A Volume (cm) : 0.352 N/A N/A % Reduction in Area: -29.90% N/A N/A % Reduction in Volume: -29.90% N/A N/A Classification: Grade 2 N/A N/A Exudate Amount: Medium N/A N/A Exudate Type: Serous N/A N/A Exudate Color: amber N/A N/A Wound Margin: Flat and Intact  N/A N/A Granulation Amount: Small (1-33%) N/A N/A Granulation Quality: Pink N/A N/A Necrotic Amount: Large (67-100%) N/A N/A Necrotic Tissue: Eschar N/A N/A Exposed Structures: N/A N/A Timothy Gibson, Timothy Gibson ([id number]) Fat Layer (Subcutaneous Tissue) Exposed: Yes Fascia: No Tendon: No Muscle: No Joint: No Bone: No Epithelialization: None N/A N/A Treatment Notes Electronic Signature(s) Signed: 08/21/2019 4:09:28 PM By: Army Melia Entered By: Army Melia on 08/21/2019 13:51:27 Timothy Gibson ([id number]) -------------------------------------------------------------------------------- [place] Details Patient Name: Timothy Gibson Date of Service: 08/21/2019 1:00 PM Medical Record Number: [id number] Patient Account Number: [id number] Date of Birth/Sex: [birth year] (69 y.o. M) Treating RN: Army Melia Primary Care Tianni Escamilla: Merrie Roof Other Clinician: Referring Timothy Gibson: Merrie Roof Treating Timothy Gibson/Extender: Melburn Hake, Timothy Gibson Weeks in Treatment: 5 Active Inactive Abuse / Safety / Falls / Self Care Management Nursing Diagnoses: Potential for falls Goals: Patient will not experience any injury related to falls Date Initiated: 07/15/2019 Target Resolution Date: 10/18/2019 Goal  Status: Active Interventions: Assess fall risk on admission and as needed Notes: Necrotic Tissue Nursing Diagnoses: Impaired tissue integrity related to necrotic/devitalized tissue Goals: Necrotic/devitalized tissue will be minimized in the wound bed Date Initiated: 07/15/2019 Target Resolution Date: 10/18/2019 Goal Status: Active Interventions: Provide education on necrotic tissue and debridement process Notes: Orientation to the Wound Care Program Nursing Diagnoses: Knowledge deficit related to the wound healing center program Goals: Patient/caregiver will verbalize understanding of the Welcome Program Date Initiated: 07/15/2019 Target Resolution Date: 10/18/2019 Goal Status: Active Interventions: Provide education on orientation to the wound center Timothy Gibson, Timothy Gibson ([id number]) Notes: Peripheral Neuropathy Nursing Diagnoses: Knowledge deficit related to disease process and management of peripheral neurovascular dysfunction Goals: Patient/caregiver will verbalize understanding of disease process and disease management Date Initiated: 07/15/2019 Target Resolution Date: 10/18/2019 Goal Status: Active Interventions: Assess signs and symptoms of neuropathy upon admission and as needed Provide education on Management of Neuropathy and Related Ulcers Notes: Wound/Skin Impairment Nursing Diagnoses: Impaired tissue integrity Goals: Ulcer/skin breakdown will heal within 14 weeks Date Initiated: 07/15/2019 Target Resolution Date: 10/18/2019 Goal Status: Active Interventions: Assess patient/caregiver ability to obtain necessary supplies Assess patient/caregiver ability to perform ulcer/skin care regimen upon admission and as needed Assess ulceration(s) every visit Notes: Electronic Signature(s) Signed: 08/21/2019 4:09:28 PM By: Army Melia Entered By: Army Melia on 08/21/2019 13:51:17 Timothy Gibson  ([id number]) -------------------------------------------------------------------------------- Pain Assessment Details Patient Name: Timothy Gibson Date of Service: 08/21/2019 1:00 PM Medical Record Number: [id number] Patient Account Number: [id number] Date of Birth/Sex: [birth year] (69 y.o. M) Treating RN: Army Melia Primary Care Ebone Alcivar: Merrie Roof Other Clinician: Referring Roshini Fulwider: Merrie Roof Treating Azaiah Mello/Extender: Melburn Hake, Timothy Gibson Weeks in Treatment: 5 Active Problems Location of Pain Severity and Description of Pain Patient Has Paino No Site Locations Pain Management and Medication Current Pain Management: Electronic Signature(s) Signed: 08/21/2019 4:09:28 PM By: Army Melia Signed: 08/21/2019 4:36:47 PM By: Lorine Bears RCP, RRT, CHT Entered By: Lorine Bears on 08/21/2019 12:57:17 Timothy Gibson ([id number]) -------------------------------------------------------------------------------- Patient/Caregiver Education Details Patient Name: Timothy Gibson Date of Service: 08/21/2019 1:00 PM Medical Record Number: [id number] Patient Account Number: [id number] Date of Birth/Gender: 03/03/1951 (68 y.o. M) Treating RN: Army Melia Primary Care Physician: Merrie Roof Other Clinician: Referring Physician: Merrie Roof Treating Physician/Extender: Sharalyn Ink in Treatment: 5 Education Assessment Education Provided To: Patient Education Topics Provided Wound/Skin Impairment: Handouts: Caring for Your Ulcer Methods: Demonstration, Explain/Verbal Responses: State content correctly Electronic Signature(s) Signed: 08/21/2019 4:09:28 PM By: Army Melia Entered By: Army Melia on  08/21/2019 13:59:32 Timothy Gibson, Timothy Gibson ([id number]) -------------------------------------------------------------------------------- Wound Assessment Details Patient Name: Timothy Gibson, Timothy Gibson Date of Service: 08/21/2019 1:00 PM Medical Record Number:  [id number] Patient Account Number: [id number] Date of Birth/Sex: July 21, 1950 (69 y.o. M) Treating RN: Army Melia Primary Care Olander Friedl: Merrie Roof Other Clinician: Referring Baleigh Rennaker: Merrie Roof Treating Shama Monfils/Extender: Melburn Hake, Timothy Gibson Weeks in Treatment: 5 Wound Status Wound Number: 3 Primary Diabetic Wound/Ulcer of the Lower Extremity Etiology: Wound Location: Left Toe Great Wound Open Wounding Event: Gradually Appeared Status: Date Acquired: 06/19/2019 Comorbid Lymphedema, Arrhythmia, Hypertension, Type II Weeks Of Treatment: 5 History: Diabetes, End Stage Renal Disease, Clustered Wound: No Rheumatoid Arthritis, Osteoarthritis, Confinement Anxiety Photos Wound Measurements Length: (cm) 1.6 % Reduction in Width: (cm) 2.8 % Reduction in Depth: (cm) 0.1 Epithelializati Area: (cm) 3.519 Volume: (cm) 0.352 Area: -29.9% Volume: -29.9% on: None Wound Description Classification: Grade 2 Foul Odor After Wound Margin: Flat and Intact Slough/Fibrino Exudate Amount: Medium Exudate Type: Serous Exudate Color: amber Cleansing: No No Wound Bed Granulation Amount: Small (1-33%) Exposed Structure Granulation Quality: Pink Fascia Exposed: No Necrotic Amount: Large (67-100%) Fat Layer (Subcutaneous Tissue) Exposed: Yes Necrotic Quality: Eschar Tendon Exposed: No Muscle Exposed: No Joint Exposed: No Bone Exposed: No Timothy Gibson, Timothy Gibson ([id number]) Treatment Notes Wound #3 (Left Toe Great) Notes prisma, gauze, foam, conform, offloading shoe Electronic Signature(s) Signed: 08/21/2019 4:09:28 PM By: Army Melia Entered By: Army Melia on 08/21/2019 13:11:53 Timothy Gibson ([id number]) -------------------------------------------------------------------------------- Vitals Details Patient Name: Timothy Gibson Date of Service: 08/21/2019 1:00 PM Medical Record Number: [id number] Patient Account Number: [id number] Date of Birth/Sex: 1951/02/14 (69 y.o. M) Treating  RN: Army Melia Primary Care Lynsey Ange: Merrie Roof Other Clinician: Referring Lis Savitt: Merrie Roof Treating Miyani Cronic/Extender: Melburn Hake, Timothy Gibson Weeks in Treatment: 5 Vital Signs Time Taken: 12:55 Temperature (F): 97.8 Height (in): 76 Pulse (bpm): 113 Weight (lbs): 244 Respiratory Rate (breaths/min): 16 Body Mass Index (BMI): 29.7 Blood Pressure (mmHg): 166/73 Reference Range: 80 - 120 mg / dl Electronic Signature(s) Signed: 08/21/2019 4:36:47 PM By: Lorine Bears RCP, RRT, CHT Entered By: Lorine Bears on 08/21/2019 12:57:41

## 2019-08-21 NOTE — Progress Notes (Addendum)
[facility] ([id number]) Visit Report for 08/21/2019 Chief Complaint Document Details Patient Name: Timothy Gibson, Timothy Gibson Date of Service: 08/21/2019 1:00 PM Medical Record Number: [id number] Patient Account Number: [id number] Date of Birth/Sex: 09/30/1950 (69 y.o. M) Treating RN: Army Melia Primary Care Provider: Merrie Roof Other Clinician: Referring Provider: Merrie Roof Treating Provider/Extender: Melburn Hake, Jaquez Farrington Weeks in Treatment: 5 Information Obtained from: Patient Chief Complaint Left Great Toe Electronic Signature(s) Signed: 08/21/2019 1:13:06 PM By: Worthy Keeler PA-C Entered By: Worthy Keeler on 08/21/2019 13:13:05 Timothy Gibson ([id number]) -------------------------------------------------------------------------------- Debridement Details Patient Name: Timothy Gibson Date of Service: 08/21/2019 1:00 PM Medical Record Number: [id number] Patient Account Number: [id number] Date of Birth/Sex: 1951-01-03 (68 y.o. M) Treating RN: Army Melia Primary Care Provider: Merrie Roof Other Clinician: Referring Provider: Merrie Roof Treating Provider/Extender: Melburn Hake, Champayne Kocian Weeks in Treatment: 5 Debridement Performed for Wound #3 Left Toe Great Assessment: Performed By: Physician [name], Thoms Barthelemy E., PA-C Debridement Type: Debridement Severity of Tissue Pre Fat layer exposed Debridement: Level of Consciousness (Pre- Awake and Alert procedure): Pre-procedure Verification/Time Yes - 13:52 Out Taken: Start Time: 13:53 Pain Control: Lidocaine Total Area Debrided (L x W): 1.6 (cm) x 2.8 (cm) = 4.48 (cm) Tissue and other material Viable, Non-Viable, Callus, Slough, Subcutaneous, Slough debrided: Level: Skin/Subcutaneous Tissue Debridement Description: Excisional Instrument: Curette Bleeding: Minimum Hemostasis Achieved: Pressure End Time: 13:54 Response to Treatment: Procedure was tolerated well Level of Consciousness Awake and  Alert (Post-procedure): Post Debridement Measurements of Total Wound Length: (cm) 1.6 Width: (cm) 2.8 Depth: (cm) 0.1 Volume: (cm) 0.352 Character of Wound/Ulcer Post Debridement: Stable Severity of Tissue Post Debridement: Fat layer exposed Post Procedure Diagnosis Same as Pre-procedure Electronic Signature(s) Signed: 08/21/2019 4:09:28 PM By: Army Melia Signed: 08/21/2019 6:17:56 PM By: Worthy Keeler PA-C Entered By: Army Melia on 08/21/2019 13:54:44 Timothy Gibson ([id number]) -------------------------------------------------------------------------------- HPI Details Patient Name: Timothy Gibson Date of Service: 08/21/2019 1:00 PM Medical Record Number: [id number] Patient Account Number: [id number] Date of Birth/Sex: 1950-12-07 (69 y.o. M) Treating RN: Army Melia Primary Care Provider: Merrie Roof Other Clinician: Referring Provider: Merrie Roof Treating Provider/Extender: Melburn Hake, Ziyon Soltau Weeks in Treatment: 5 History of Present Illness HPI Description: 11/27/17 on evaluation today patient presents for initial evaluation concerning an issue that he has been having with his right great toe which began last Thursday 11/22/17. He has a history of diabetes mellitus type to which he has had for greater than 30 years currently he is on insulin. Subsequently he also has a history of hypertension. On physical exam inspection is also appears he may have some peripheral vascular disease with his ABI's being noncompressible bilaterally. He is on dialysis as well and this is on Monday, Wednesday, and Friday. Patient was hospitalized back in January/February 2019 although this was more for stomach/back pain though they never told me exactly what was going on. This is according to the patient. Subsequently the ulcer which is between the third and fourth toe when space of the right foot as well is on the plantar aspect of the fourth toe is due to him having cleaned his toes this  morning and he states that his finger which is large split the toe tissue in between causing the wounds that we currently see. With that being said he states this has happened before I explained I would definitely recommend that he not do this any longer. He can use a small soft washcloth to clean between the toes without causing trauma. Subsequently  the right great toe hatchery does show evidence of necrotic tissue present on the surface of the wound specifically there is callous and dead skin surrounding which is trapping fluid causing problems as far as the wound is concerned. The surface of the wound does show slough although due to his vascular flow I'm not going to sharply debride this today I think I will selectively debride away the necrotic skin as well is callous from around the surrounding so this will not continue to be a moisture issue. Nonetheless the patient has no pain he does have diabetic neuropathy. 12/06/17-He is here in follow-up evaluation for a right great toe ulcer. He is voicing no complaints or concerns. He continues to infrequently/socially smokes cigars with no desire to quit. He has been compliant with offloading, using open toed surgical shoe; has been compliant using Santyl daily. He continues on clindamycin although takes it inconsistently secondary to indigestion. The plain film x-ray performed on 5/23 impression: Soft tissue swelling of the left first toe and is noted with probable irregular lucency involving distal tuft of first distal phalanx concerning for osteomyelitis. MRI may be performed for further evaluation; MRI ordered. The vascular evaluation performed on 5/24 field non-compressible ABIs bilaterally and reduced TBI bilaterally suggesting significant tibial disease. He will be referred to vascular medicine for further evaluation. 12/13/17-He is here in follow-up evaluation for right great toe ulcer. He has appointment next Thursday for the MRI and  vascular evaluation. Wound culture obtained last week grew abundant Klebsiella oxytoca (multidrug sensitivity), and abundant enteric coccus faecalis (sensitive to ampicillin). Ampicillin and Cipro were called in, along with a probiotic. In light of his appointments next Thursday he will follow-up in 2 weeks, we will continue with Santyl 12/27/17-He is here for evaluation for a right great toe ulcer. 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 He did have evaluation by vascular medicine, Dr. Ronalee Belts, on 6/13, at that appointment it was decided for him to undergo angiography with possible intervention but he refused and is still considering. If he chooses to have it done he will contact the office. He has not heard back from either ID offices that he was referred to two weeks ago: [place] and [place]. there is improvement in both appearance and measurement to the ulcer. We will extend antibiotic therapy (amoxicillin 500 every 12, Cipro 500 daily) for additional 2 weeks pending infectious disease consult, he will continue with Santyl daily. He was reminded to continue with probiotic therapy while on oral antibiotics; he denies any GI disturbance. 01/03/18-He is here in follow-up evaluation for right great toe ulcer. He is decided to not undergo any vascular intervention at this time. He was established with [facility] infectious disease (Dr. Linus Salmons) last week initiated on vancomycin and cefazolin with dialysis treatment for 6 weeks. We will continue with current treatment plan with Santyl and offloading and he will follow- up in 2 weeks 01/17/18-He is seen in follow-up evaluation for right great toe ulcer. There is improvement in appearance with less nonviable tissue present. We will continue with same treatment plan he will follow-up next week. He is tolerating IV antibiotics without any complications 08/20/92-RD is seen in  follow up evaluation for right great toe ulcer. There continues to be improvement. He is tolerating IV antibiotics with hemodialysis, completion date of 7/31. We will transition to collagen and and follow-up next week 02/07/18-He is here in follow up for a right  great toe ulcer. There is red granulation tissue throughout the wound, improved in appearance. He completed antibiotics yesterday. He saw podiatry last Thursday for nail trimming, at that appointment he periwound callus was trimmed and a culture was obtained; culture negative. He does not have a follow up appointment with Timothy Gibson, Timothy Gibson. ([id number]) infectious disease. We will submit for grafix. 02/14/18-He is here in follow-up evaluation for a right great toe ulcer. There is slow improvement, red granulation tissue throughout. The insurance approval for grafix is pending. We will continue with collagen and he'll follow-up next week 02/21/18-He is seen in follow-up evaluation for right great toe ulcer. There is improvement with red granulation tissue throughout. He was approved for oasis and this was placed today. He will follow up next week 02/28/18-He is seen in follow-up evaluation for right great toe ulcer. The wound is stable, oasis applied and he'll follow-up next week 03/07/18-He is seen in follow-up violation of her right great toe ulcer. The wound is dry, no evidence of drainage. The wound appears healed today. He was painted with Betadine, instructed to paint with Betadine daily, cover with band-aid for the next week and then cover with band-aid for the following week continue with surgical shoe. He was encouraged to contact the clinic with any evidence of drainage, or change in appearance to toe/wound. He will be discharged from wound care services Readmission: 07/14/18 on evaluation today patient presents for initial evaluation our clinic concerning issues that he is having with his left great toe. We previously saw him in 2019 for  his right great toe which was actually worse at that time and subsequently was able to be completely healed in the end although it did take several months. With that being said the patient is currently having an area on his left great toe that occurred initially as result of a blister that came up he's not really sure why or how. Subsequently this was initially treated by his podiatrist though the recommendation there was apparently amputation according to the patient and his daughter who was present with him today. With that being said the patient did not want to proceed with amputation and would like to try to perform wound care and get this to heal without having to go down that road. We were able to do that with his other toe and so he would like to at least give that a try before going forward with amputation. Again I completely understand his concern in this regard. With that being said I did discuss with the patient obviously that it's always a chance that we cannot get this to heal with normal wound care measures but obviously we will give it our best shot. He does actually have an appointment scheduled for later today with the vascular specialist in order to evaluate his blood flow. That was at the recommendation of his podiatrist as well. Obviously if he is having good arterial flow and nonetheless is still experiencing issues with having the wound delay in healing then it may simply be a matter of we need to initiate more aggressive wound to therapy in order to see if we can get this to heal. No fevers, chills, nausea, or vomiting noted at this time. The patient notes that this woman has been present for approximately three weeks. He is not a smoker. His most recent hemoglobin A1c was 6.2 on 06/18/19. 07/17/2019 on evaluation today patient appears unfortunately to not be doing as well. He did see vascular I  did review the note as well. That was on 07/15/2019. Subsequently there can actually be  taking him to the OR for an angiogram due to poor arterial flow into this extremity. Apparently the atherosclerotic changes were severe. The patient is stated to be at risk for limb loss. Nonetheless the good news is this is being scheduled fairly quickly he will have the surgery/procedure next Thursday. With that being said I am going also go ahead based on the results of his x-ray which showed some chance of osteomyelitis in the distal portion of the toe get the patient set up for an MRI to further evaluate for the possibility of osteomyelitis obviously if he does have osteomyelitis we need to know so that we can address this appropriately. 07/29/2019 upon evaluation today patient appears to be doing better in general with regard to his toe ulcer. He does have much better blood flow you can actually feel a palpable pulse which appears to be very strong at this time. I did review his arterial intervention/angiogram which did show that he had evidence of sufficient arterial flow restoration at this point. He did have occlusions that were 60 and 80% that residually were only 10 and 25% with no limiting flow. This is excellent news and hopefully he will continue to show signs of improvement in light of the improved vascular status. With that being said the toe mainly shows somewhat of an eschar today I think that this is fairly stable and as such probably would be best not to really do much in the way of debridement at this point I do believe Betadine would likely be a good option. 08/07/2019 on evaluation today patient actually appears to be doing about the same. The one difference is the eschar that we were just seeing if we can maintain is actually starting to loosen up and leak around the edges I think it is time to actually go ahead and remove this today. Is also no longer stable is very soft. Nonetheless the patient is okay with proceeding with debridement at this point. Fortunately there is no  signs of active infection. No fevers, chills, nausea, vomiting, or diarrhea. The patient states that he took his last antibiotic that he had last night. He would need a refill as he will not be seeing infectious disease until February 2. Nonetheless obviously I do not want him to have any break in therapy especially when he is doing as well as he is currently. 08/14/2019 on evaluation today patient appears to be doing quite well all things considered in regard to his toe. I do feel like he is making progress and though this is not completely healed by any means we still have some ways to go I feel like he is making progress which is excellent. There is no sign of active infection at this time systemically. He does have osteomyelitis of the distal phalanx of his great toe on the left. He did see Dr. Linus Salmons. That is the infectious disease doctor in [place] where he was referred. Dr. Linus Salmons apparently according to the note did not feel like the patient's MRI revealed osteomyelitis Timothy Gibson, Timothy Gibson. ([id number]) but just reactive marrow and subsequently did not recommend any antibiotic therapy whatsoever. To be peripherally honest I am not really in agreement with this based on the MRI results and what we are seeing and I think he has been responding at least decently well to oral medication as well. Nonetheless we may want to check into  the possibility of IV antibiotics being administered at the dialysis center. I will discuss this with Dr. Dellia Nims further. 08/21/2019 upon evaluation today patient appears at this point to be making some progress with regard to his wounds. He has been tolerating the dressing changes without complication. Fortunately there is no evidence of active infection at this time. Overall I feel like he is doing well with the oral antibiotics I discussed with Dr. Dellia Nims the possibility of doing IV antibiotic therapy versus continuing with the oral. His opinion was if the patient is  doing well with oral to maybe stick with that. Obviously we can initiate additional Cipro as well just to help ensure that there is nothing worsening here. The Cipro should be beneficial for him as well. That helps cover more gram-negative's were is at the doxycycline is more gram-positive's. Electronic Signature(s) Signed: 08/21/2019 5:42:13 PM By: Worthy Keeler PA-C Entered By: Worthy Keeler on 08/21/2019 17:42:13 Timothy Gibson, Timothy Gibson ([id number]) -------------------------------------------------------------------------------- Physical Exam Details Patient Name: Timothy Gibson Date of Service: 08/21/2019 1:00 PM Medical Record Number: [id number] Patient Account Number: [id number] Date of Birth/Sex: September 19, 1950 (69 y.o. M) Treating RN: Army Melia Primary Care Provider: Merrie Roof Other Clinician: Referring Provider: Merrie Roof Treating Provider/Extender: [name], Dushawn Pusey Weeks in Treatment: 5 Constitutional Well-nourished and well-hydrated in no acute distress. Respiratory normal breathing without difficulty. Psychiatric this patient is able to make decisions and demonstrates good insight into disease process. Alert and Oriented x 3. pleasant and cooperative. Notes Fraction patient's wound did require sharp debridement clear away some of the necrotic tissue from the surface of the wound and he actually tolerated the debridement today without complication. Post debridement the wound bed appears to be doing much better which is excellent news. Electronic Signature(s) Signed: 08/21/2019 5:43:27 PM By: Worthy Keeler PA-C Entered By: Worthy Keeler on 08/21/2019 17:43:27 Timothy Gibson, Timothy Gibson ([id number]) -------------------------------------------------------------------------------- Physician Orders Details Patient Name: Timothy Gibson Date of Service: 08/21/2019 1:00 PM Medical Record Number: [id number] Patient Account Number: [id number] Date of Birth/Sex: August 13, 1950 (68 y.o.  M) Treating RN: Army Melia Primary Care Provider: Merrie Roof Other Clinician: Referring Provider: Merrie Roof Treating Provider/Extender: Melburn Hake, Troyce Gieske Weeks in Treatment: 5 Verbal / Phone Orders: No Diagnosis Coding ICD-10 Coding Code Description [phone] Other chronic osteomyelitis, left ankle and foot E11.621 Type 2 diabetes mellitus with foot ulcer L97.522 Non-pressure chronic ulcer of other part of left foot with fat layer exposed I10 Essential (primary) hypertension Wound Cleansing Wound #3 Left Toe Great o Dial antibacterial soap, wash wounds, rinse and pat dry prior to dressing wounds o May Shower, gently pat wound dry prior to applying new dressing. Anesthetic (add to Medication List) Wound #3 Left Toe Great o Topical Lidocaine 4% cream applied to wound bed prior to debridement (In Clinic Only). Primary Wound Dressing Wound #3 Left Toe Great o Silver Collagen Secondary Dressing Wound #3 Left Toe Great o Dry Gauze o Conform/Kerlix o Foam Dressing Change Frequency Wound #3 Left Toe Great o Change dressing every day. Follow-up Appointments Wound #3 Left Toe Great o Return Appointment in 1 week. Patient Medications Allergies: No Known Drug Allergies Notifications Medication Indication Start End Cipro 08/21/2019 Timothy Gibson, Timothy Gibson ([id number]) Notifications Medication Indication Start End DOSE 1 - oral 500 mg tablet - 1 tablet oral taken 1 time a day for 30 days. on Dialysis days take after dialysis Electronic Signature(s) Signed: 08/21/2019 5:48:42 PM By: Worthy Keeler PA-C Previous Signature: 08/21/2019 4:09:28 PM Version By:  Nicki Reaper, Dajea Entered By: Worthy Keeler on 08/21/2019 17:48:42 Timothy Gibson, Timothy Gibson ([id number]) -------------------------------------------------------------------------------- Problem List Details Patient Name: Timothy Gibson Date of Service: 08/21/2019 1:00 PM Medical Record Number: [id number] Patient Account  Number: [id number] Date of Birth/Sex: 08/27/1950 (69 y.o. M) Treating RN: Army Melia Primary Care Provider: Merrie Roof Other Clinician: Referring Provider: Merrie Roof Treating Provider/Extender: Melburn Hake, Kriste Broman Weeks in Treatment: 5 Active Problems ICD-10 Evaluated Encounter Code Description Active Date Today Diagnosis [phone] Other chronic osteomyelitis, left ankle and foot 07/29/2019 No Yes E11.621 Type 2 diabetes mellitus with foot ulcer 07/15/2019 No Yes L97.522 Non-pressure chronic ulcer of other part of left foot with fat 07/15/2019 No Yes layer exposed I10 Essential (primary) hypertension 07/15/2019 No Yes Inactive Problems Resolved Problems Electronic Signature(s) Signed: 08/21/2019 1:12:59 PM By: Worthy Keeler PA-C Entered By: Worthy Keeler on 08/21/2019 13:12:59 Timothy Gibson ([id number]) -------------------------------------------------------------------------------- Progress Note Details Patient Name: Timothy Gibson Date of Service: 08/21/2019 1:00 PM Medical Record Number: [id number] Patient Account Number: [id number] Date of Birth/Sex: 1951/03/09 (68 y.o. M) Treating RN: Army Melia Primary Care Provider: Merrie Roof Other Clinician: Referring Provider: Merrie Roof Treating Provider/Extender: Melburn Hake, Edmond Ginsberg Weeks in Treatment: 5 Subjective Chief Complaint Information obtained from Patient Left Great Toe History of Present Illness (HPI) 11/27/17 on evaluation today patient presents for initial evaluation concerning an issue that he has been having with his right great toe which began last Thursday 11/22/17. He has a history of diabetes mellitus type to which he has had for greater than 30 years currently he is on insulin. Subsequently he also has a history of hypertension. On physical exam inspection is also appears he may have some peripheral vascular disease with his ABI's being noncompressible bilaterally. He is on dialysis as well and this is on  Monday, Wednesday, and Friday. Patient was hospitalized back in January/February 2019 although this was more for stomach/back pain though they never told me exactly what was going on. This is according to the patient. Subsequently the ulcer which is between the third and fourth toe when space of the right foot as well is on the plantar aspect of the fourth toe is due to him having cleaned his toes this morning and he states that his finger which is large split the toe tissue in between causing the wounds that we currently see. With that being said he states this has happened before I explained I would definitely recommend that he not do this any longer. He can use a small soft washcloth to clean between the toes without causing trauma. Subsequently the right great toe hatchery does show evidence of necrotic tissue present on the surface of the wound specifically there is callous and dead skin surrounding which is trapping fluid causing problems as far as the wound is concerned. The surface of the wound does show slough although due to his vascular flow I'm not going to sharply debride this today I think I will selectively debride away the necrotic skin as well is callous from around the surrounding so this will not continue to be a moisture issue. Nonetheless the patient has no pain he does have diabetic neuropathy. 12/06/17-He is here in follow-up evaluation for a right great toe ulcer. He is voicing no complaints or concerns. He continues to infrequently/socially smokes cigars with no desire to quit. He has been compliant with offloading, using open toed surgical shoe; has been compliant using Santyl daily. He continues on clindamycin although takes it inconsistently secondary  to indigestion. The plain film x-ray performed on 5/23 impression: Soft tissue swelling of the left first toe and is noted with probable irregular lucency involving distal tuft of first distal phalanx concerning for  osteomyelitis. MRI may be performed for further evaluation; MRI ordered. The vascular evaluation performed on 5/24 field non-compressible ABIs bilaterally and reduced TBI bilaterally suggesting significant tibial disease. He will be referred to vascular medicine for further evaluation. 12/13/17-He is here in follow-up evaluation for right great toe ulcer. He has appointment next Thursday for the MRI and vascular evaluation. Wound culture obtained last week grew abundant Klebsiella oxytoca (multidrug sensitivity), and abundant enteric coccus faecalis (sensitive to ampicillin). Ampicillin and Cipro were called in, along with a probiotic. In light of his appointments next Thursday he will follow-up in 2 weeks, we will continue with Santyl 12/27/17-He is here for evaluation for a right great toe ulcer. 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 He did have evaluation by vascular medicine, Dr. Ronalee Belts, on 6/13, at that appointment it was decided for him to undergo angiography with possible intervention but he refused and is still considering. If he chooses to have it done he will contact the office. He has not heard back from either ID offices that he was referred to two weeks ago: [place] and [place]. there is improvement in both appearance and measurement to the ulcer. We will extend antibiotic therapy (amoxicillin 500 every 12, Cipro 500 daily) for additional 2 weeks pending infectious disease consult, he will continue with Santyl daily. He was reminded to continue with probiotic therapy while on oral antibiotics; he denies any GI disturbance. 01/03/18-He is here in follow-up evaluation for right great toe ulcer. He is decided to not undergo any vascular intervention at this time. He was established with [facility] infectious disease (Dr. Linus Salmons) last week initiated on vancomycin and cefazolin with dialysis treatment for 6  weeks. We will continue with current treatment plan with Santyl and offloading and he will follow- up in 2 weeks 01/17/18-He is seen in follow-up evaluation for right great toe ulcer. There is improvement in appearance with less nonviable tissue present. We will continue with same treatment plan he will follow-up next week. He is tolerating IV antibiotics without Timothy Gibson, Timothy Gibson. ([id number]) any complications 9/93/71-IR is seen in follow up evaluation for right great toe ulcer. There continues to be improvement. He is tolerating IV antibiotics with hemodialysis, completion date of 7/31. We will transition to collagen and and follow-up next week 02/07/18-He is here in follow up for a right great toe ulcer. There is red granulation tissue throughout the wound, improved in appearance. He completed antibiotics yesterday. He saw podiatry last Thursday for nail trimming, at that appointment he periwound callus was trimmed and a culture was obtained; culture negative. He does not have a follow up appointment with infectious disease. We will submit for grafix. 02/14/18-He is here in follow-up evaluation for a right great toe ulcer. There is slow improvement, red granulation tissue throughout. The insurance approval for grafix is pending. We will continue with collagen and he'll follow-up next week 02/21/18-He is seen in follow-up evaluation for right great toe ulcer. There is improvement with red granulation tissue throughout. He was approved for oasis and this was placed today. He will follow up next week 02/28/18-He is seen in follow-up evaluation for right great toe ulcer. The wound is stable, oasis applied and he'll follow-up next week 03/07/18-He  is seen in follow-up violation of her right great toe ulcer. The wound is dry, no evidence of drainage. The wound appears healed today. He was painted with Betadine, instructed to paint with Betadine daily, cover with band-aid for the next week and then cover  with band-aid for the following week continue with surgical shoe. He was encouraged to contact the clinic with any evidence of drainage, or change in appearance to toe/wound. He will be discharged from wound care services Readmission: 07/14/18 on evaluation today patient presents for initial evaluation our clinic concerning issues that he is having with his left great toe. We previously saw him in 2019 for his right great toe which was actually worse at that time and subsequently was able to be completely healed in the end although it did take several months. With that being said the patient is currently having an area on his left great toe that occurred initially as result of a blister that came up he's not really sure why or how. Subsequently this was initially treated by his podiatrist though the recommendation there was apparently amputation according to the patient and his daughter who was present with him today. With that being said the patient did not want to proceed with amputation and would like to try to perform wound care and get this to heal without having to go down that road. We were able to do that with his other toe and so he would like to at least give that a try before going forward with amputation. Again I completely understand his concern in this regard. With that being said I did discuss with the patient obviously that it's always a chance that we cannot get this to heal with normal wound care measures but obviously we will give it our best shot. He does actually have an appointment scheduled for later today with the vascular specialist in order to evaluate his blood flow. That was at the recommendation of his podiatrist as well. Obviously if he is having good arterial flow and nonetheless is still experiencing issues with having the wound delay in healing then it may simply be a matter of we need to initiate more aggressive wound to therapy in order to see if we can get this to  heal. No fevers, chills, nausea, or vomiting noted at this time. The patient notes that this woman has been present for approximately three weeks. He is not a smoker. His most recent hemoglobin A1c was 6.2 on 06/18/19. 07/17/2019 on evaluation today patient appears unfortunately to not be doing as well. He did see vascular I did review the note as well. That was on 07/15/2019. Subsequently there can actually be taking him to the OR for an angiogram due to poor arterial flow into this extremity. Apparently the atherosclerotic changes were severe. The patient is stated to be at risk for limb loss. Nonetheless the good news is this is being scheduled fairly quickly he will have the surgery/procedure next Thursday. With that being said I am going also go ahead based on the results of his x-ray which showed some chance of osteomyelitis in the distal portion of the toe get the patient set up for an MRI to further evaluate for the possibility of osteomyelitis obviously if he does have osteomyelitis we need to know so that we can address this appropriately. 07/29/2019 upon evaluation today patient appears to be doing better in general with regard to his toe ulcer. He does have much better blood flow you  can actually feel a palpable pulse which appears to be very strong at this time. I did review his arterial intervention/angiogram which did show that he had evidence of sufficient arterial flow restoration at this point. He did have occlusions that were 60 and 80% that residually were only 10 and 25% with no limiting flow. This is excellent news and hopefully he will continue to show signs of improvement in light of the improved vascular status. With that being said the toe mainly shows somewhat of an eschar today I think that this is fairly stable and as such probably would be best not to really do much in the way of debridement at this point I do believe Betadine would likely be a good option. 08/07/2019 on  evaluation today patient actually appears to be doing about the same. The one difference is the eschar that we were just seeing if we can maintain is actually starting to loosen up and leak around the edges I think it is time to actually go ahead and remove this today. Is also no longer stable is very soft. Nonetheless the patient is okay with proceeding with debridement at this point. Fortunately there is no signs of active infection. No fevers, chills, nausea, vomiting, or diarrhea. The patient states that he took his last antibiotic that he had last night. He would need a refill as he will not be seeing infectious disease until February 2. Nonetheless obviously I do not want him to have any break in therapy especially when he is doing as well as he is currently. Timothy Gibson, Timothy Gibson ([id number]) 08/14/2019 on evaluation today patient appears to be doing quite well all things considered in regard to his toe. I do feel like he is making progress and though this is not completely healed by any means we still have some ways to go I feel like he is making progress which is excellent. There is no sign of active infection at this time systemically. He does have osteomyelitis of the distal phalanx of his great toe on the left. He did see Dr. Linus Salmons. That is the infectious disease doctor in [place] where he was referred. Dr. Linus Salmons apparently according to the note did not feel like the patient's MRI revealed osteomyelitis but just reactive marrow and subsequently did not recommend any antibiotic therapy whatsoever. To be peripherally honest I am not really in agreement with this based on the MRI results and what we are seeing and I think he has been responding at least decently well to oral medication as well. Nonetheless we may want to check into the possibility of IV antibiotics being administered at the dialysis center. I will discuss this with Dr. Dellia Nims further. 08/21/2019 upon evaluation today patient  appears at this point to be making some progress with regard to his wounds. He has been tolerating the dressing changes without complication. Fortunately there is no evidence of active infection at this time. Overall I feel like he is doing well with the oral antibiotics I discussed with Dr. Dellia Nims the possibility of doing IV antibiotic therapy versus continuing with the oral. His opinion was if the patient is doing well with oral to maybe stick with that. Obviously we can initiate additional Cipro as well just to help ensure that there is nothing worsening here. The Cipro should be beneficial for him as well. That helps cover more gram-negative's were is at the doxycycline is more gram-positive's. Objective Constitutional Well-nourished and well-hydrated in no acute distress. Vitals Time Taken:  12:55 PM, Height: 76 in, Weight: 244 lbs, BMI: 29.7, Temperature: 97.8 F, Pulse: 113 bpm, Respiratory Rate: 16 breaths/min, Blood Pressure: 166/73 mmHg. Respiratory normal breathing without difficulty. Psychiatric this patient is able to make decisions and demonstrates good insight into disease process. Alert and Oriented x 3. pleasant and cooperative. General Notes: Fraction patient's wound did require sharp debridement clear away some of the necrotic tissue from the surface of the wound and he actually tolerated the debridement today without complication. Post debridement the wound bed appears to be doing much better which is excellent news. Integumentary (Hair, Skin) Wound #3 status is Open. Original cause of wound was Gradually Appeared. The wound is located on the Left Toe Great. The wound measures 1.6cm length x 2.8cm width x 0.1cm depth; 3.519cm^2 area and 0.352cm^3 volume. There is Fat Layer (Subcutaneous Tissue) Exposed exposed. There is a medium amount of serous drainage noted. The wound margin is flat and intact. There is small (1-33%) pink granulation within the wound bed. There is a  large (67-100%) amount of necrotic tissue within the wound bed including Eschar. 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 There was a Excisional Skin/Subcutaneous Tissue Debridement with a total area of 4.48 sq cm performed by [name], Jaquilla Woodroof E., PA-C. With the following instrument(s): Curette to remove Viable and Non-Viable tissue/material. Material removed includes Callus, Subcutaneous Tissue, and Slough after achieving pain control using Lidocaine. A time out was conducted at 13:52, prior to the start of the procedure. A Minimum amount of bleeding was controlled with Pressure. The procedure was tolerated well. Post Debridement Measurements: 1.6cm length x 2.8cm width x 0.1cm depth; 0.352cm^3 volume. Character of Wound/Ulcer Post Debridement is stable. Severity of Tissue Post Debridement is: Fat layer exposed. Post procedure Diagnosis Wound #3: Same as Pre-Procedure Plan Wound Cleansing: Wound #3 Left Toe Great: Dial antibacterial soap, wash wounds, rinse and pat dry prior to dressing wounds May Shower, gently pat wound dry prior to applying new dressing. Anesthetic (add to Medication List): Wound #3 Left Toe Great: Topical Lidocaine 4% cream applied to wound bed prior to debridement (In Clinic Only). Primary Wound Dressing: Wound #3 Left Toe Great: Silver Collagen Secondary Dressing: Wound #3 Left Toe Great: Dry Gauze Conform/Kerlix Foam Dressing Change Frequency: Wound #3 Left Toe Great: Change dressing every day. Follow-up Appointments: Wound #3 Left Toe  Great: Return Appointment in 1 week. YOUSEF, HUGE ([id number]) The following medication(s) was prescribed: Cipro oral 500 mg tablet 1 1 tablet oral taken 1 time a day for 30 days. on Dialysis days take after dialysis starting 08/21/2019 1. I would recommend at this time that we actually add Cipro to the patient's regimen. He will take 500 mg 1 time a day and on dialysis days he should take this after dialysis. He will be done after 1 week with the doxycycline I think that would be sufficient he has been on that for about 6 weeks currently. 2. I am to recommend that we switch to a silver collagen dressing as I think this will be beneficial for the patient as well he and his daughter are in agreement with that  plan. 3. I am also going to suggest that he continue with offloading he is using a postop surgical shoe I think that is definitely appropriate. We will see patient back for reevaluation in 1 week here in the clinic. If anything worsens or changes patient will contact our office for additional recommendations. Electronic Signature(s) Signed: 08/21/2019 5:49:29 PM By: Worthy Keeler PA-C Entered By: Worthy Keeler on 08/21/2019 17:49:29 KIERNAN, ATKERSON ([id number]) -------------------------------------------------------------------------------- SuperBill Details Patient Name: Timothy Gibson Date of Service: 08/21/2019 Medical Record Number: [id number] Patient Account Number: [id number] Date of Birth/Sex: 06-Oct-1950 (68 y.o. M) Treating RN: Army Melia Primary Care Provider: Merrie Roof Other Clinician: Referring Provider: Merrie Roof Treating Provider/Extender: Melburn Hake, Jackquline Branca Weeks in Treatment: 5 Diagnosis Coding ICD-10 Codes Code Description [phone] Other chronic osteomyelitis, left ankle and foot E11.621 Type 2 diabetes mellitus with foot ulcer L97.522 Non-pressure chronic ulcer of other part of left foot with fat layer exposed I10 Essential (primary)  hypertension Facility Procedures CPT4 Code: [id number] Description: 41740 - DEB SUBQ TISSUE 20 SQ CM/< ICD-10 Diagnosis Description L97.522 Non-pressure chronic ulcer of other part of left foot with fat Modifier: layer exposed Quantity: 1 Physician Procedures CPT4 Code: [id number] Description: 56314 - WC PHYS LEVEL 4 - EST PT ICD-10 Diagnosis Description M86.672 Other chronic osteomyelitis, left ankle and foot E11.621 Type 2 diabetes mellitus with foot ulcer L97.522 Non-pressure chronic ulcer of other part of left foot with fat  I10 Essential (primary) hypertension Modifier: 25 layer exposed Quantity: 1 CPT4 Code: 9702637 Description: 11042 - WC PHYS SUBQ TISS 20 SQ CM ICD-10 Diagnosis Description L97.522 Non-pressure chronic ulcer of other part of left foot with fat Modifier: layer exposed Quantity: 1 Electronic Signature(s) Signed: 08/21/2019 5:49:47 PM By: Worthy Keeler PA-C Entered By: Worthy Keeler on 08/21/2019 17:49:47

## 2019-08-22 DIAGNOSIS — D509 Iron deficiency anemia, unspecified: Secondary | ICD-10-CM | POA: Diagnosis not present

## 2019-08-22 DIAGNOSIS — N186 End stage renal disease: Secondary | ICD-10-CM | POA: Diagnosis not present

## 2019-08-22 DIAGNOSIS — N2581 Secondary hyperparathyroidism of renal origin: Secondary | ICD-10-CM | POA: Diagnosis not present

## 2019-08-22 DIAGNOSIS — D631 Anemia in chronic kidney disease: Secondary | ICD-10-CM | POA: Diagnosis not present

## 2019-08-22 DIAGNOSIS — Z23 Encounter for immunization: Secondary | ICD-10-CM | POA: Diagnosis not present

## 2019-08-22 DIAGNOSIS — Z992 Dependence on renal dialysis: Secondary | ICD-10-CM | POA: Diagnosis not present

## 2019-08-25 DIAGNOSIS — Z992 Dependence on renal dialysis: Secondary | ICD-10-CM | POA: Diagnosis not present

## 2019-08-25 DIAGNOSIS — N2581 Secondary hyperparathyroidism of renal origin: Secondary | ICD-10-CM | POA: Diagnosis not present

## 2019-08-25 DIAGNOSIS — D631 Anemia in chronic kidney disease: Secondary | ICD-10-CM | POA: Diagnosis not present

## 2019-08-25 DIAGNOSIS — D509 Iron deficiency anemia, unspecified: Secondary | ICD-10-CM | POA: Diagnosis not present

## 2019-08-25 DIAGNOSIS — N186 End stage renal disease: Secondary | ICD-10-CM | POA: Diagnosis not present

## 2019-08-26 ENCOUNTER — Ambulatory Visit (INDEPENDENT_AMBULATORY_CARE_PROVIDER_SITE_OTHER): Payer: Medicare Other

## 2019-08-26 ENCOUNTER — Other Ambulatory Visit (INDEPENDENT_AMBULATORY_CARE_PROVIDER_SITE_OTHER): Payer: Self-pay | Admitting: Vascular Surgery

## 2019-08-26 ENCOUNTER — Other Ambulatory Visit: Payer: Self-pay

## 2019-08-26 ENCOUNTER — Encounter (INDEPENDENT_AMBULATORY_CARE_PROVIDER_SITE_OTHER): Payer: Self-pay | Admitting: Vascular Surgery

## 2019-08-26 ENCOUNTER — Ambulatory Visit (INDEPENDENT_AMBULATORY_CARE_PROVIDER_SITE_OTHER): Payer: Medicare Other | Admitting: Vascular Surgery

## 2019-08-26 VITALS — BP 197/76 | HR 98 | Resp 16 | Ht 76.0 in | Wt 256.0 lb

## 2019-08-26 DIAGNOSIS — I7025 Atherosclerosis of native arteries of other extremities with ulceration: Secondary | ICD-10-CM | POA: Diagnosis not present

## 2019-08-26 DIAGNOSIS — N186 End stage renal disease: Secondary | ICD-10-CM

## 2019-08-26 DIAGNOSIS — Z9862 Peripheral vascular angioplasty status: Secondary | ICD-10-CM

## 2019-08-26 DIAGNOSIS — I10 Essential (primary) hypertension: Secondary | ICD-10-CM

## 2019-08-26 DIAGNOSIS — E1165 Type 2 diabetes mellitus with hyperglycemia: Secondary | ICD-10-CM

## 2019-08-26 DIAGNOSIS — I70262 Atherosclerosis of native arteries of extremities with gangrene, left leg: Secondary | ICD-10-CM

## 2019-08-26 NOTE — Assessment & Plan Note (Signed)
blood glucose control important in reducing the progression of atherosclerotic disease. Also, involved in wound healing. On appropriate medications.  

## 2019-08-26 NOTE — Assessment & Plan Note (Signed)
Certainly is an atherosclerotic risk factor as well as a risk factor for poor wound healing even after revascularization.

## 2019-08-26 NOTE — Patient Instructions (Signed)

## 2019-08-26 NOTE — Progress Notes (Signed)
MRN : [id number]  Timothy Gibson is a 69 y.o. (1950-08-19) male who presents with chief complaint of  Chief Complaint  Patient presents with  . Follow-up    4 wk [facility] Post RLE ANGIO  .  History of Present Illness: Patient returns today in follow up of his PAD and ulceration on the left foot.  After intervention, he says the wound has been healing and they have been trimming away the dead skin.  The wound persists and is stressed today.  He sees the wound care center who seems to be doing a reasonably good job with this.  His noninvasive studies today show noncompressible ABIs bilaterally although the waveforms are fairly strong to the ankle on each side.  His digital pressure is still reduced on the left at 37.  This would be indicative of small vessel disease on the left.  Current Outpatient Medications  Medication Sig Dispense Refill  . albuterol (VENTOLIN HFA) 108 (90 Base) MCG/ACT inhaler Inhale 2 puffs into the lungs every 6 (six) hours as needed for wheezing or shortness of breath. 18 g 5  . aspirin EC 81 MG tablet Take 1 tablet (81 mg total) by mouth daily. 150 tablet 2  . atorvastatin (LIPITOR) 10 MG tablet Take 1 tablet (10 mg total) by mouth daily. 30 tablet 11  . clopidogrel (PLAVIX) 75 MG tablet Take 1 tablet (75 mg total) by mouth daily. 30 tablet 11  . losartan (COZAAR) 100 MG tablet Take 1 tablet (100 mg total) by mouth daily. 90 tablet 1  . sevelamer carbonate (RENVELA) 800 MG tablet Take 3,200 mg by mouth 3 (three) times daily with meals.     Marland Kitchen umeclidinium-vilanterol (ANORO ELLIPTA) 62.5-25 MCG/INH AEPB INHALE 1 PUFF INTO THE LUNGS DAILY 180 each 1  . acetaminophen (TYLENOL) 500 MG tablet Take 500 mg by mouth daily as needed for moderate pain.    Marland Kitchen amLODipine (NORVASC) 5 MG tablet Take 1 tablet (5 mg total) by mouth daily. (Patient not taking: Reported on 08/26/2019) 90 tablet 1  . doxycycline (VIBRAMYCIN) 100 MG capsule Take 100 mg by mouth 2 (two) times daily.    Marland Kitchen  EPINEPHrine 0.3 mg/0.3 mL IJ SOAJ injection Inject 0.3 mg into the muscle as needed for anaphylaxis.    Marland Kitchen insulin degludec (TRESIBA) 100 UNIT/ML SOPN FlexTouch Pen Inject 0.12 mLs (12 Units total) into the skin at bedtime. (Patient not taking: Reported on 08/26/2019) 6 mL 2   No current facility-administered medications for this visit.    Past Medical History:  Diagnosis Date  . Cancer ([place])    right kidney  . Diabetes mellitus without complication ([place])   . Dialysis patient [facility])    on Mon-Wed-Fri dialysis  . ESRD (end stage renal disease) ([place])   . History of gangrene   . Hypertension     Past Surgical History:  Procedure Laterality Date  . A/V FISTULAGRAM Left 06/04/2018   Procedure: A/V FISTULAGRAM;  Surgeon: Katha Cabal, MD;  Location: [place] [facility];  Service: Cardiovascular;  Laterality: Left;  . COLONOSCOPY WITH PROPOFOL N/A 12/16/2015   Procedure: COLONOSCOPY WITH PROPOFOL;  Surgeon: Manya Silvas, MD;  Location: [facility] ENDOSCOPY;  Service: Endoscopy;  Laterality: N/A;  . KNEE SURGERY    . left forearm AV fistula    . LOWER EXTREMITY ANGIOGRAPHY Left 07/24/2019   Procedure: LOWER EXTREMITY ANGIOGRAPHY;  Surgeon: Algernon Huxley, MD;  Location: [place] [facility];  Service: Cardiovascular;  Laterality:  Left;  . NEPHRECTOMY       Social History   Tobacco Use  . Smoking status: Current Some Day Smoker  . Smokeless tobacco: Former Systems developer  . Tobacco comment: cigar smoker  Substance Use Topics  . Alcohol use: Not Currently  . Drug use: Never    Family History  Problem Relation Age of Onset  . Hypertension Father   . Colon cancer Father   . Hypertension Mother   . Parkinson's disease Mother   . Diabetes Mother   . Alzheimer's disease Mother      Allergies  Allergen Reactions  . Adhesive [Tape]     Pulls skin off.  Must use paper tape     REVIEW OF SYSTEMS (Negative unless checked) Constitutional: [] ?Weight  loss[] ?Fever[] ?Chills Cardiac:[] ?Chest pain[] ? Atrial Fibrillation[] ?Palpitations [] ?Shortness of breath when laying flat [] ?Shortness of breath with exertion. [] ?Shortness of breath at rest Vascular: [] ?Pain in legs with walking[] ?Pain in legswith standing[] ?Pain in legs when laying flat [] ?Claudication  [] ?Pain in feet when laying flat [] ?History of DVT [] ?Phlebitis [x] ?Swelling in legs [] ?Varicose veins [x] ?Non-healing ulcers Pulmonary: [] ?Uses home oxygen [] ?Productive cough[] ?Hemoptysis [] ?Wheeze [] ?COPD [] ?Asthma Neurologic: [] ?Dizziness[] ?Seizures [] ?Blackouts[] ?History of stroke [] ?History of TIA[] ?Aphasia [] ?Temporary Blindness[] ?Weaknessor numbness in arm [] ?Weakness or numbnessin leg Musculoskeletal:[] ?Joint swelling [] ?Joint pain [] ?Low back pain  [] ? History of Knee Replacement [] ?Arthritis [] ?back Surgeries[] ? Spinal Stenosis  Hematologic:[] ?Easy bruising[] ?Easy bleeding [] ?Hypercoagulable state [x] ?Anemic Gastrointestinal:[] ?Diarrhea [] ?Vomiting[] ?Gastroesophageal reflux/heartburn[] ?Difficulty swallowing. [] ?Abdominal pain Genitourinary: [x] ?Chronic kidney disease [] ?Difficulturination [] ?Anuric[] ?Blood in urine [] ?Frequenturination [] ?Burning with urination[] ?Hematuria Skin: [] ?Rashes [] ?Ulcers [x] ?Wounds Psychological: [] ?History of anxiety[] ?History of major depression  [] ? Memory Difficulties  Physical Examination  BP (!) 197/76 (BP Location: Right Arm)   Pulse 98   Resp 16   Ht 6\' 4"  (1.93 m)   Wt 256 lb (116.1 kg)   BMI 31.16 kg/m  Gen:  WD/WN, NAD Head: [place]/AT, No temporalis wasting. Ear/Nose/Throat: Hearing grossly intact, nares w/o erythema or drainage Eyes: Conjunctiva clear. Sclera non-icteric Neck: Supple.  Trachea midline Pulmonary:  Good air movement, no use of accessory muscles.  Cardiac: RRR, no JVD Vascular:  Vessel Right Left  Radial Palpable  Palpable                          PT  1+ palpable  1+ palpable  DP  2+ palpable  1+ palpable    Musculoskeletal: M/S 5/5 throughout.  No deformity or atrophy.  In a walking boot on the left foot today.  Wound is currently dressed.  Mild bilateral lower extremity edema. Neurologic: Sensation grossly intact in extremities.  Symmetrical.  Speech is fluent.  Psychiatric: Judgment intact, Mood & affect appropriate for pt's clinical situation. Dermatologic: Left toe ulceration is dressed today.       Labs Recent Results (from the past 2160 hour(s))  SARS CORONAVIRUS 2 (TAT 6-24 HRS) Nasopharyngeal Nasopharyngeal Swab     Status: None   Collection Time: 07/22/19 12:32 PM   Specimen: Nasopharyngeal Swab  Result Value Ref Range   SARS Coronavirus 2 NEGATIVE NEGATIVE    Comment: (NOTE) SARS-CoV-2 target nucleic acids are NOT DETECTED. The SARS-CoV-2 RNA is generally detectable in upper and lower respiratory specimens during the acute phase of infection. Negative results do not preclude SARS-CoV-2 infection, do not rule out co-infections with other pathogens, and should not be used as the sole basis for treatment or other patient management decisions. Negative results must be combined with clinical observations, patient history, and epidemiological information. The expected result is Negative. Fact  Sheet for Patients: SugarRoll.be Fact Sheet for Healthcare Providers: https://www.woods-mathews.com/ This test is not yet approved or cleared by the [place] FDA and  has been authorized for detection and/or diagnosis of SARS-CoV-2 by FDA under an Emergency Use Authorization (EUA). This EUA will remain  in effect (meaning this test can be used) for the duration of the COVID-19 declaration under Section 56 4(b)(1) of the Act, 21 U.S.C. section 360bbb-3(b)(1), unless the authorization is terminated or revoked sooner. Performed at [facility], [place] [address] [place], [place] [address]   Glucose, capillary     Status: None   Collection Time: 07/24/19  9:17 AM  Result Value Ref Range   Glucose-Capillary 82 70 - 99 mg/dL  BUN     Status: Abnormal   Collection Time: 07/24/19  9:30 AM  Result Value Ref Range   BUN 32 (H) 8 - 23 mg/dL    Comment: Performed at [facility], [address] [place], [place] [address]  Creatinine, serum     Status: Abnormal   Collection Time: 07/24/19  9:30 AM  Result Value Ref Range   Creatinine, Ser 8.43 (H) 0.61 - 1.24 mg/dL   GFR calc non Af Amer 6 (L) >60 mL/min   GFR calc Af Amer 7 (L) >60 mL/min    Comment: Performed at [facility], [address] [place], [place] [address]  Glucose, capillary     Status: None   Collection Time: 07/24/19 12:47 PM  Result Value Ref Range   Glucose-Capillary 74 70 - 99 mg/dL    Radiology No results found.  Assessment/Plan  Essential hypertension blood pressure control important in reducing the progression of atherosclerotic disease. On appropriate oral medications.   Type 2 diabetes mellitus with hyperglycemia (HCC) blood glucose control important in reducing the progression of atherosclerotic disease. Also, involved in wound healing. On appropriate medications.   End stage renal disease ([place]) Certainly is an atherosclerotic risk factor as well as a risk factor for poor wound healing even after revascularization.  Atherosclerosis of native arteries of the extremities with ulceration ([place]) His noninvasive studies today show noncompressible ABIs bilaterally although the waveforms are fairly strong to the ankle on each side.  His digital pressure is still reduced on the left at 37.  This would be indicative of small vessel disease on the left.  Although his flow is still not ideal, his wound is healing.  It is likely mostly small vessel disease at this point although if his wound gets infected or begins to deteriorate, a  repeat angiogram would certainly be recommended.  At this time, a short interval follow-up of 3 months is reasonable as long as his wound continues to improve.    Leotis Pain, MD  08/26/2019 4:40 PM    This note was created with Dragon medical transcription system.  Any errors from dictation are purely unintentional

## 2019-08-26 NOTE — Assessment & Plan Note (Signed)
blood pressure control important in reducing the progression of atherosclerotic disease. On appropriate oral medications.  

## 2019-08-26 NOTE — Assessment & Plan Note (Signed)
His noninvasive studies today show noncompressible ABIs bilaterally although the waveforms are fairly strong to the ankle on each side.  His digital pressure is still reduced on the left at 37.  This would be indicative of small vessel disease on the left.  Although his flow is still not ideal, his wound is healing.  It is likely mostly small vessel disease at this point although if his wound gets infected or begins to deteriorate, a repeat angiogram would certainly be recommended.  At this time, a short interval follow-up of 3 months is reasonable as long as his wound continues to improve.

## 2019-08-27 DIAGNOSIS — N186 End stage renal disease: Secondary | ICD-10-CM | POA: Diagnosis not present

## 2019-08-27 DIAGNOSIS — D631 Anemia in chronic kidney disease: Secondary | ICD-10-CM | POA: Diagnosis not present

## 2019-08-27 DIAGNOSIS — Z992 Dependence on renal dialysis: Secondary | ICD-10-CM | POA: Diagnosis not present

## 2019-08-27 DIAGNOSIS — D509 Iron deficiency anemia, unspecified: Secondary | ICD-10-CM | POA: Diagnosis not present

## 2019-08-27 DIAGNOSIS — N2581 Secondary hyperparathyroidism of renal origin: Secondary | ICD-10-CM | POA: Diagnosis not present

## 2019-08-28 ENCOUNTER — Ambulatory Visit: Payer: Medicare Other | Admitting: Physician Assistant

## 2019-08-28 ENCOUNTER — Ambulatory Visit (INDEPENDENT_AMBULATORY_CARE_PROVIDER_SITE_OTHER): Payer: Medicare Other | Admitting: Family Medicine

## 2019-08-28 ENCOUNTER — Encounter: Payer: Self-pay | Admitting: Family Medicine

## 2019-08-28 VITALS — Temp 94.9°F | Ht 76.0 in | Wt 256.0 lb

## 2019-08-28 DIAGNOSIS — M86171 Other acute osteomyelitis, right ankle and foot: Secondary | ICD-10-CM

## 2019-08-28 NOTE — Progress Notes (Signed)
Will cancel this OV appt, pt was following up on his toe infection but has already gotten in with wound care, infectious disease and vascular for this infection now

## 2019-08-29 DIAGNOSIS — N2581 Secondary hyperparathyroidism of renal origin: Secondary | ICD-10-CM | POA: Diagnosis not present

## 2019-08-29 DIAGNOSIS — D631 Anemia in chronic kidney disease: Secondary | ICD-10-CM | POA: Diagnosis not present

## 2019-08-29 DIAGNOSIS — D509 Iron deficiency anemia, unspecified: Secondary | ICD-10-CM | POA: Diagnosis not present

## 2019-08-29 DIAGNOSIS — N186 End stage renal disease: Secondary | ICD-10-CM | POA: Diagnosis not present

## 2019-08-29 DIAGNOSIS — Z992 Dependence on renal dialysis: Secondary | ICD-10-CM | POA: Diagnosis not present

## 2019-08-29 NOTE — Progress Notes (Signed)
Timothy Gibson ([id number]) Visit Report for 08/07/2019 Arrival Information Details Patient Name: Timothy Gibson Date of Service: 08/07/2019 8:45 AM Medical Record Number: [id number] Patient Account Number: [id number] Date of Birth/Sex: Mar 15, 1951 (69 y.o. M) Treating RN: Cornell Barman Primary Care Hale Chalfin: Merrie Roof Other Clinician: Referring Karesa Maultsby: Merrie Roof Treating Diamante Truszkowski/Extender: Melburn Hake, [name] Weeks in Treatment: 3 Visit Information History Since Last Visit Has Dressing in Place as Prescribed: Yes Patient Arrived: Ambulatory Pain Present Now: No Arrival Time: 08:53 Accompanied By: daughter Transfer Assistance: None Patient Identification Verified: Yes Secondary Verification Process Completed: Yes Electronic Signature(s) Signed: 08/29/2019 11:50:22 AM By: Gretta Cool, BSN, RN, CWS, Kim RN, BSN Entered By: Gretta Cool, BSN, RN, CWS, Kim on 08/07/2019 08:54:30 Timothy Gibson ([id number]) -------------------------------------------------------------------------------- Encounter Discharge Information Details Patient Name: Timothy Gibson Date of Service: 08/07/2019 8:45 AM Medical Record Number: [id number] Patient Account Number: [id number] Date of Birth/Sex: 1951/03/01 (69 y.o. M) Treating RN: Army Melia Primary Care Azarias Chiou: Merrie Roof Other Clinician: Referring Cesareo Vickrey: Merrie Roof Treating Alline Pio/Extender: Melburn Hake, [name] Weeks in Treatment: 3 Encounter Discharge Information Items Post Procedure Vitals Discharge Condition: Stable Temperature (F): 98.4 Ambulatory Status: Ambulatory Pulse (bpm): 85 Discharge Destination: Home Respiratory Rate (breaths/min): 16 Transportation: Private Auto Blood Pressure (mmHg): 168/84 Accompanied By: family Schedule Follow-up Appointment: Yes Clinical Summary of Care: Electronic Signature(s) Signed: 08/07/2019 11:58:35 AM By: Army Melia Entered By: Army Melia on 08/07/2019 09:31:54 Timothy Gibson  ([id number]) -------------------------------------------------------------------------------- Lower Extremity Assessment Details Patient Name: Timothy Gibson Date of Service: 08/07/2019 8:45 AM Medical Record Number: [id number] Patient Account Number: [id number] Date of Birth/Sex: 12-04-1950 (69 y.o. M) Treating RN: Cornell Barman Primary Care Windle Huebert: Merrie Roof Other Clinician: Referring Harvir Patry: Merrie Roof Treating Gad Aymond/Extender: Sharalyn Ink in Treatment: 3 Electronic Signature(s) Signed: 08/29/2019 11:50:22 AM By: Gretta Cool, BSN, RN, CWS, Kim RN, BSN Entered By: Gretta Cool, BSN, RN, CWS, Kim on 08/07/2019 09:02:46 Timothy Gibson ([id number]) -------------------------------------------------------------------------------- Multi Wound Chart Details Patient Name: Timothy Gibson Date of Service: 08/07/2019 8:45 AM Medical Record Number: [id number] Patient Account Number: [id number] Date of Birth/Sex: 11-21-1950 (69 y.o. M) Treating RN: Army Melia Primary Care Markayla Reichart: Merrie Roof Other Clinician: Referring Taijuan Serviss: Merrie Roof Treating Vishruth Seoane/Extender: Melburn Hake, [name] Weeks in Treatment: 3 Vital Signs Height(in): 76 Pulse(bpm): 85 Weight(lbs): 244 Blood Pressure(mmHg): 168/82 Body Mass Index(BMI): 30 Temperature(F): 98.4 Respiratory Rate 16 (breaths/min): Photos: [3:No Photos] [N/A:N/A] Wound Location: [3:Left Toe Great] [N/A:N/A] Wounding Event: [3:Gradually Appeared] [N/A:N/A] Primary Etiology: [3:Diabetic Wound/Ulcer of the Lower Extremity] [N/A:N/A] Date Acquired: [3:06/19/2019] [N/A:N/A] Weeks of Treatment: [3:3] [N/A:N/A] Wound Status: [3:Open] [N/A:N/A] Measurements L x W x D [3:2.5x3x0.1] [N/A:N/A] (cm) Area (cm) : [3:5.89] [N/A:N/A] Volume (cm) : [3:0.589] [N/A:N/A] % Reduction in Area: [3:-117.30%] [N/A:N/A] % Reduction in Volume: [3:-117.30% Grade 2] [N/A:N/A N/A] Treatment Notes Electronic Signature(s) Signed: 08/07/2019 11:58:35 AM  By: Army Melia Entered By: Army Melia on 08/07/2019 09:16:23 Timothy Gibson ([id number]) -------------------------------------------------------------------------------- [place] Details Patient Name: Timothy Gibson Date of Service: 08/07/2019 8:45 AM Medical Record Number: [id number] Patient Account Number: [id number] Date of Birth/Sex: 1951-04-17 (69 y.o. M) Treating RN: Cornell Barman Primary Care Nesanel Aguila: Merrie Roof Other Clinician: Referring Codee Tutson: Merrie Roof Treating Ranetta Armacost/Extender: Melburn Hake, [name] Weeks in Treatment: 3 Active Inactive Abuse / Safety / Falls / Self Care Management Nursing Diagnoses: Potential for falls Goals: Patient will not experience any injury related to falls Date Initiated: 07/15/2019 Target Resolution Date: 10/18/2019 Goal Status: Active Interventions: Assess fall risk on admission and as needed  Notes: Necrotic Tissue Nursing Diagnoses: Impaired tissue integrity related to necrotic/devitalized tissue Goals: Necrotic/devitalized tissue will be minimized in the wound bed Date Initiated: 07/15/2019 Target Resolution Date: 10/18/2019 Goal Status: Active Interventions: Provide education on necrotic tissue and debridement process Notes: Orientation to the Wound Care Program Nursing Diagnoses: Knowledge deficit related to the wound healing center program Goals: Patient/caregiver will verbalize understanding of the [place] Program Date Initiated: 07/15/2019 Target Resolution Date: 10/18/2019 Goal Status: Active Interventions: Provide education on orientation to the wound center Timothy Gibson ([id number]) Notes: Peripheral Neuropathy Nursing Diagnoses: Knowledge deficit related to disease process and management of peripheral neurovascular dysfunction Goals: Patient/caregiver will verbalize understanding of disease process and disease management Date Initiated: 07/15/2019 Target Resolution Date:  10/18/2019 Goal Status: Active Interventions: Assess signs and symptoms of neuropathy upon admission and as needed Provide education on Management of Neuropathy and Related Ulcers Notes: Wound/Skin Impairment Nursing Diagnoses: Impaired tissue integrity Goals: Ulcer/skin breakdown will heal within 14 weeks Date Initiated: 07/15/2019 Target Resolution Date: 10/18/2019 Goal Status: Active Interventions: Assess patient/caregiver ability to obtain necessary supplies Assess patient/caregiver ability to perform ulcer/skin care regimen upon admission and as needed Assess ulceration(s) every visit Notes: Electronic Signature(s) Signed: 08/07/2019 11:58:35 AM By: Army Melia Signed: 08/29/2019 11:50:22 AM By: Gretta Cool, BSN, RN, CWS, Kim RN, BSN Entered By: Army Melia on 08/07/2019 09:16:00 Timothy Gibson ([id number]) -------------------------------------------------------------------------------- Pain Assessment Details Patient Name: Timothy Gibson Date of Service: 08/07/2019 8:45 AM Medical Record Number: [id number] Patient Account Number: [id number] Date of Birth/Sex: 12/10/1950 (69 y.o. M) Treating RN: Cornell Barman Primary Care Meylin Stenzel: Merrie Roof Other Clinician: Referring Calib Wadhwa: Merrie Roof Treating Dyquan Minks/Extender: Melburn Hake, [name] Weeks in Treatment: 3 Active Problems Location of Pain Severity and Description of Pain Patient Has Paino No Site Locations Pain Management and Medication Current Pain Management: Notes Patient denies pain at this time. Electronic Signature(s) Signed: 08/29/2019 11:50:22 AM By: Gretta Cool, BSN, RN, CWS, Kim RN, BSN Entered By: Gretta Cool, BSN, RN, CWS, Kim on 08/07/2019 08:55:11 Timothy Gibson ([id number]) -------------------------------------------------------------------------------- Patient/Caregiver Education Details Patient Name: Timothy Gibson Date of Service: 08/07/2019 8:45 AM Medical Record Number: [id number] Patient Account Number:  [id number] Date of Birth/Gender: June 27, 1951 (69 y.o. M) Treating RN: Army Melia Primary Care Physician: Merrie Roof Other Clinician: Referring Physician: Merrie Roof Treating Physician/Extender: Sharalyn Ink in Treatment: 3 Education Assessment Education Provided To: Patient Education Topics Provided Wound/Skin Impairment: Handouts: Caring for Your Ulcer Methods: Demonstration, Explain/Verbal Responses: State content correctly Electronic Signature(s) Signed: 08/07/2019 11:58:35 AM By: Army Melia Entered By: Army Melia on 08/07/2019 09:30:39 Timothy Gibson ([id number]) -------------------------------------------------------------------------------- Wound Assessment Details Patient Name: Timothy Gibson Date of Service: 08/07/2019 8:45 AM Medical Record Number: [id number] Patient Account Number: [id number] Date of Birth/Sex: February 09, 1951 (69 y.o. M) Treating RN: Cornell Barman Primary Care Harden Bramer: Merrie Roof Other Clinician: Referring Rionna Feltes: Merrie Roof Treating Ulyses Panico/Extender: Melburn Hake, [name] Weeks in Treatment: 3 Wound Status Wound Number: 3 Primary Diabetic Wound/Ulcer of the Lower Etiology: Extremity Wound Location: Left Toe Great Wound Status: Open Wounding Event: Gradually Appeared Date Acquired: 06/19/2019 Weeks Of Treatment: 3 Clustered Wound: No Wound Measurements Length: (cm) 2.5 Width: (cm) 3 Depth: (cm) 0.1 Area: (cm) 5.89 Volume: (cm) 0.589 % Reduction in Area: -117.3% % Reduction in Volume: -117.3% Wound Description Classification: Grade 2 Electronic Signature(s) Signed: 08/29/2019 11:50:22 AM By: Gretta Cool, BSN, RN, CWS, Kim RN, BSN Entered By: Gretta Cool, BSN, RN, CWS, Kim on 08/07/2019 09:00:30 Timothy Gibson ([id number]) -------------------------------------------------------------------------------- Vitals Details Patient  Name: Timothy Gibson Date of Service: 08/07/2019 8:45 AM Medical Record Number: [id number] Patient  Account Number: [id number] Date of Birth/Sex: March 30, 1951 (69 y.o. M) Treating RN: Cornell Barman Primary Care Janaria Mccammon: Merrie Roof Other Clinician: Referring Bernece Gall: Merrie Roof Treating Gracielynn Birkel/Extender: Melburn Hake, [name] Weeks in Treatment: 3 Vital Signs Time Taken: 08:55 Temperature (F): 98.4 Height (in): 76 Pulse (bpm): 85 Weight (lbs): 244 Respiratory Rate (breaths/min): 16 Body Mass Index (BMI): 29.7 Blood Pressure (mmHg): 168/82 Reference Range: 80 - 120 mg / dl Electronic Signature(s) Signed: 08/29/2019 11:50:22 AM By: Gretta Cool, BSN, RN, CWS, Kim RN, BSN Entered By: Gretta Cool, BSN, RN, CWS, Kim on 08/07/2019 08:57:30

## 2019-09-01 DIAGNOSIS — N186 End stage renal disease: Secondary | ICD-10-CM | POA: Diagnosis not present

## 2019-09-01 DIAGNOSIS — N2581 Secondary hyperparathyroidism of renal origin: Secondary | ICD-10-CM | POA: Diagnosis not present

## 2019-09-01 DIAGNOSIS — D631 Anemia in chronic kidney disease: Secondary | ICD-10-CM | POA: Diagnosis not present

## 2019-09-01 DIAGNOSIS — Z992 Dependence on renal dialysis: Secondary | ICD-10-CM | POA: Diagnosis not present

## 2019-09-01 DIAGNOSIS — D509 Iron deficiency anemia, unspecified: Secondary | ICD-10-CM | POA: Diagnosis not present

## 2019-09-02 ENCOUNTER — Encounter: Payer: Medicare Other | Admitting: Physician Assistant

## 2019-09-02 ENCOUNTER — Other Ambulatory Visit: Payer: Self-pay

## 2019-09-02 DIAGNOSIS — N186 End stage renal disease: Secondary | ICD-10-CM | POA: Diagnosis not present

## 2019-09-02 DIAGNOSIS — E11621 Type 2 diabetes mellitus with foot ulcer: Secondary | ICD-10-CM | POA: Diagnosis not present

## 2019-09-02 DIAGNOSIS — L97522 Non-pressure chronic ulcer of other part of left foot with fat layer exposed: Secondary | ICD-10-CM | POA: Diagnosis not present

## 2019-09-02 DIAGNOSIS — E1122 Type 2 diabetes mellitus with diabetic chronic kidney disease: Secondary | ICD-10-CM | POA: Diagnosis not present

## 2019-09-02 DIAGNOSIS — I120 Hypertensive chronic kidney disease with stage 5 chronic kidney disease or end stage renal disease: Secondary | ICD-10-CM | POA: Diagnosis not present

## 2019-09-02 DIAGNOSIS — M069 Rheumatoid arthritis, unspecified: Secondary | ICD-10-CM | POA: Diagnosis not present

## 2019-09-02 NOTE — Progress Notes (Signed)
THARUN, CAPPELLA ([id number]) Visit Report for 09/02/2019 Arrival Information Details Patient Name: NEO, YEPIZ Date of Service: 09/02/2019 11:45 AM Medical Record Number: [id number] Patient Account Number: [id number] Date of Birth/Sex: 08-25-1950 (69 y.o. M) Treating RN: Army Melia Primary Care Lyana Asbill: Merrie Roof Other Clinician: Referring Felissa Blouch: Merrie Roof Treating Devri Kreher/Extender: Melburn Hake, [name] Weeks in Treatment: 7 Visit Information History Since Last Visit Added or deleted any medications: No Patient Arrived: Ambulatory Any new allergies or adverse reactions: No Arrival Time: 11:42 Had a fall or experienced change in No Accompanied By: family activities of daily living that may affect Transfer Assistance: None risk of falls: Patient Identification Verified: Yes Signs or symptoms of abuse/neglect since last visito No Hospitalized since last visit: No Has Dressing in Place as Prescribed: Yes Pain Present Now: No Electronic Signature(s) Signed: 09/02/2019 12:50:45 PM By: Army Melia Entered By: Army Melia on 09/02/2019 11:42:44 [name] ([id number]) -------------------------------------------------------------------------------- Encounter Discharge Information Details Patient Name: [name] Date of Service: 09/02/2019 11:45 AM Medical Record Number: [id number] Patient Account Number: [id number] Date of Birth/Sex: [birth year] (69 y.o. M) Treating RN: Montey Hora Primary Care Easton Fetty: Merrie Roof Other Clinician: Referring Kenyana Husak: Merrie Roof Treating Marisa Hufstetler/Extender: Melburn Hake, [name] Weeks in Treatment: 7 Encounter Discharge Information Items Post Procedure Vitals Discharge Condition: Stable Temperature (F): 98.1 Ambulatory Status: Ambulatory Pulse (bpm): 85 Discharge Destination: Home Respiratory Rate (breaths/min): 16 Transportation: Private Auto Blood Pressure (mmHg): 154/89 Accompanied By: caregiver Schedule  Follow-up Appointment: Yes Clinical Summary of Care: Electronic Signature(s) Signed: 09/02/2019 1:15:24 PM By: Montey Hora Entered By: Montey Hora on 09/02/2019 12:11:33 [name] ([id number]) -------------------------------------------------------------------------------- Lower Extremity Assessment Details Patient Name: [name] Date of Service: 09/02/2019 11:45 AM Medical Record Number: [id number] Patient Account Number: [id number] Date of Birth/Sex: [birth year] (69 y.o. M) Treating RN: Army Melia Primary Care Kebron Pulse: Merrie Roof Other Clinician: Referring Keiley Levey: Merrie Roof Treating Nana Vastine/Extender: [name], [name] Weeks in Treatment: 7 Edema Assessment Assessed: [Left: No] [Right: No] Edema: [Left: N] [Right: o] Vascular Assessment Pulses: Dorsalis Pedis Palpable: [Left:Yes] Electronic Signature(s) Signed: 09/02/2019 12:50:45 PM By: Army Melia Entered By: Army Melia on 09/02/2019 11:47:45 [name] ([id number]) -------------------------------------------------------------------------------- Multi Wound Chart Details Patient Name: [name] Date of Service: 09/02/2019 11:45 AM Medical Record Number: [id number] Patient Account Number: [id number] Date of Birth/Sex: [birth year] (69 y.o. M) Treating RN: Montey Hora Primary Care Linn Clavin: Merrie Roof Other Clinician: Referring Christos Mixson: Merrie Roof Treating Rahul Malinak/Extender: Melburn Hake, [name] Weeks in Treatment: 7 Vital Signs Height(in): 76 Pulse(bpm): 85 Weight(lbs): 244 Blood Pressure(mmHg): 154/89 Body Mass Index(BMI): 30 Temperature(F): 98.1 Respiratory Rate(breaths/min): 16 Photos: [N/A:N/A] Wound Location: Left Toe Great N/A N/A Wounding Event: Gradually Appeared N/A N/A Primary Etiology: Diabetic Wound/Ulcer of the Lower N/A N/A Extremity Comorbid History: Lymphedema, Arrhythmia, N/A N/A Hypertension, Type II Diabetes, End Stage Renal Disease,  Rheumatoid Arthritis, Osteoarthritis, Confinement Anxiety Date Acquired: 06/19/2019 N/A N/A Weeks of Treatment: 7 N/A N/A Wound Status: Open N/A N/A Measurements L x W x D (cm) 1.5x2.5x0.1 N/A N/A Area (cm) : 2.945 N/A N/A Volume (cm) : 0.295 N/A N/A % Reduction in Area: -8.70% N/A N/A % Reduction in Volume: -8.90% N/A N/A Classification: Grade 2 N/A N/A Exudate Amount: Medium N/A N/A Exudate Type: Serous N/A N/A Exudate Color: amber N/A N/A Wound Margin: Flat and Intact N/A N/A Granulation Amount: Small (1-33%) N/A N/A Granulation Quality: Pink N/A N/A Necrotic Amount: Large (67-100%) N/A N/A Necrotic Tissue: Eschar N/A N/A Exposed Structures: Fat Layer (Subcutaneous Tissue)  N/A N/A Exposed: Yes Fascia: No Tendon: No Muscle: No Joint: No Bone: No Epithelialization: None N/A N/A Treatment Notes ARIES, TOWNLEY ([id number]) Electronic Signature(s) Signed: 09/02/2019 1:15:24 PM By: Montey Hora Entered By: Montey Hora on 09/02/2019 12:04:03 QUANDRE, POLINSKI ([id number]) -------------------------------------------------------------------------------- Hendrix Details Patient Name: [name] Date of Service: 09/02/2019 11:45 AM Medical Record Number: [id number] Patient Account Number: [id number] Date of Birth/Sex: May 31, 1951 (69 y.o. M) Treating RN: Montey Hora Primary Care Avyn Coate: Merrie Roof Other Clinician: Referring Sharmane Dame: Merrie Roof Treating Madysin Crisp/Extender: Melburn Hake, [name] Weeks in Treatment: 7 Active Inactive Abuse / Safety / Falls / Self Care Management Nursing Diagnoses: Potential for falls Goals: Patient will not experience any injury related to falls Date Initiated: 07/15/2019 Target Resolution Date: 10/18/2019 Goal Status: Active Interventions: Assess fall risk on admission and as needed Notes: Necrotic Tissue Nursing Diagnoses: Impaired tissue integrity related to necrotic/devitalized  tissue Goals: Necrotic/devitalized tissue will be minimized in the wound bed Date Initiated: 07/15/2019 Target Resolution Date: 10/18/2019 Goal Status: Active Interventions: Provide education on necrotic tissue and debridement process Notes: Orientation to the Wound Care Program Nursing Diagnoses: Knowledge deficit related to the wound healing center program Goals: Patient/caregiver will verbalize understanding of the [facility] Date Initiated: 07/15/2019 Target Resolution Date: 10/18/2019 Goal Status: Active Interventions: Provide education on orientation to the wound center Notes: Peripheral Neuropathy Nursing Diagnoses: Knowledge deficit related to disease process and management of peripheral neurovascular dysfunction RISHI, VICARIO ([id number]) Goals: Patient/caregiver will verbalize understanding of disease process and disease management Date Initiated: 07/15/2019 Target Resolution Date: 10/18/2019 Goal Status: Active Interventions: Assess signs and symptoms of neuropathy upon admission and as needed Provide education on Management of Neuropathy and Related Ulcers Notes: Wound/Skin Impairment Nursing Diagnoses: Impaired tissue integrity Goals: Ulcer/skin breakdown will heal within 14 weeks Date Initiated: 07/15/2019 Target Resolution Date: 10/18/2019 Goal Status: Active Interventions: Assess patient/caregiver ability to obtain necessary supplies Assess patient/caregiver ability to perform ulcer/skin care regimen upon admission and as needed Assess ulceration(s) every visit Notes: Electronic Signature(s) Signed: 09/02/2019 1:15:24 PM By: Montey Hora Entered By: Montey Hora on 09/02/2019 12:03:53 [name] ([id number]) -------------------------------------------------------------------------------- Pain Assessment Details Patient Name: [name] Date of Service: 09/02/2019 11:45 AM Medical Record Number: [id number] Patient Account  Number: [id number] Date of Birth/Sex: [birth year] (69 y.o. M) Treating RN: Army Melia Primary Care Vinisha Faxon: Merrie Roof Other Clinician: Referring Indie Boehne: Merrie Roof Treating Micky Overturf/Extender: Melburn Hake, [name] Weeks in Treatment: 7 Active Problems Location of Pain Severity and Description of Pain Patient Has Paino No Site Locations Pain Management and Medication Current Pain Management: Electronic Signature(s) Signed: 09/02/2019 12:50:45 PM By: Army Melia Entered By: Army Melia on 09/02/2019 11:45:00 [name] ([id number]) -------------------------------------------------------------------------------- Patient/Caregiver Education Details Patient Name: [name] Date of Service: 09/02/2019 11:45 AM Medical Record Number: [id number] Patient Account Number: [id number] Date of Birth/Gender: Oct 01, 1950 (69 y.o. M) Treating RN: Montey Hora Primary Care Physician: Merrie Roof Other Clinician: Referring Physician: Merrie Roof Treating Physician/Extender: Sharalyn Ink in Treatment: 7 Education Assessment Education Provided To: Patient and Caregiver Education Topics Provided Hyperbaric Oxygenation: Handouts: Other: need for HBOT Methods: Explain/Verbal Responses: State content correctly Electronic Signature(s) Signed: 09/02/2019 1:15:24 PM By: Montey Hora Entered By: Montey Hora on 09/02/2019 12:10:33 [name] ([id number]) -------------------------------------------------------------------------------- Wound Assessment Details Patient Name: [name] Date of Service: 09/02/2019 11:45 AM Medical Record Number: [id number] Patient Account Number: [id number] Date of Birth/Sex: [birth year] (69 y.o. M) Treating RN: Army Melia Primary  Care Lela Gell: Merrie Roof Other Clinician: Referring Cambree Hendrix: Merrie Roof Treating Mashonda Broski/Extender: Melburn Hake, [name] Weeks in Treatment: 7 Wound Status Wound Number: 3 Primary Diabetic  Wound/Ulcer of the Lower Extremity Etiology: Wound Location: Left Toe Great Wound Open Wounding Event: Gradually Appeared Status: Date Acquired: 06/19/2019 Comorbid Lymphedema, Arrhythmia, Hypertension, Type II Diabetes, Weeks Of Treatment: 7 History: End Stage Renal Disease, Rheumatoid Arthritis, Clustered Wound: No Osteoarthritis, Confinement Anxiety Photos Wound Measurements Length: (cm) 1.5 % Re Width: (cm) 2.5 % Re Depth: (cm) 0.1 Epit Area: (cm) 2.945 Volume: (cm) 0.295 duction in Area: -8.7% duction in Volume: -8.9% helialization: None Wound Description Classification: Grade 2 Foul Wound Margin: Flat and Intact Slou Exudate Amount: Medium Exudate Type: Serous Exudate Color: amber Odor After Cleansing: No gh/Fibrino No Wound Bed Granulation Amount: Small (1-33%) Exposed Structure Granulation Quality: Pink Fascia Exposed: No Necrotic Amount: Large (67-100%) Fat Layer (Subcutaneous Tissue) Exposed: Yes Necrotic Quality: Eschar Tendon Exposed: No Muscle Exposed: No Joint Exposed: No Bone Exposed: No Treatment Notes Wound #3 (Left Toe Great) Notes santyl, gauze, foam, conform, offloading shoe Electronic Signature(s) AMBERS, IYENGAR ([id number]) Signed: 09/02/2019 12:50:45 PM By: Army Melia Entered By: Army Melia on 09/02/2019 11:47:31 [name] ([id number]) -------------------------------------------------------------------------------- Vitals Details Patient Name: [name] Date of Service: 09/02/2019 11:45 AM Medical Record Number: [id number] Patient Account Number: [id number] Date of Birth/Sex: Dec 30, 1950 (69 y.o. M) Treating RN: Army Melia Primary Care Oris Calmes: Merrie Roof Other Clinician: Referring Audre Cenci: Merrie Roof Treating Ezekiel Menzer/Extender: Melburn Hake, [name] Weeks in Treatment: 7 Vital Signs Time Taken: 11:42 Temperature (F): 98.1 Height (in): 76 Pulse (bpm): 85 Weight (lbs): 244 Respiratory Rate (breaths/min):  16 Body Mass Index (BMI): 29.7 Blood Pressure (mmHg): 154/89 Reference Range: 80 - 120 mg / dl Electronic Signature(s) Signed: 09/02/2019 12:50:45 PM By: Army Melia Entered By: Army Melia on 09/02/2019 11:44:51

## 2019-09-02 NOTE — Progress Notes (Addendum)
ANEES, VANECEK ([id number]) Visit Report for 09/02/2019 Chief Complaint Document Details Patient Name: Timothy Gibson, Timothy Gibson Date of Service: 09/02/2019 11:45 AM Medical Record Number: [id number] Patient Account Number: [id number] Date of Birth/Sex: Jul 07, 1951 (69 y.o. M) Treating RN: Montey Hora Primary Care Provider: Merrie Roof Other Clinician: Referring Provider: Merrie Roof Treating Provider/Extender: Melburn Hake, Miu Chiong Weeks in Treatment: 7 Information Obtained from: Patient Chief Complaint Left Great Toe Electronic Signature(s) Signed: 09/02/2019 12:01:11 PM By: Worthy Keeler PA-C Entered By: Worthy Keeler on 09/02/2019 12:01:10 Timothy Gibson ([id number]) -------------------------------------------------------------------------------- Debridement Details Patient Name: Timothy Gibson Date of Service: 09/02/2019 11:45 AM Medical Record Number: [id number] Patient Account Number: [id number] Date of Birth/Sex: 12/26/1950 (68 y.o. M) Treating RN: Montey Hora Primary Care Provider: Merrie Roof Other Clinician: Referring Provider: Merrie Roof Treating Provider/Extender: Melburn Hake, Tavonna Worthington Weeks in Treatment: 7 Debridement Performed for Wound #3 Left Toe Great Assessment: Performed By: Physician [name], Sherell Christoffel E., PA-C Debridement Type: Debridement Severity of Tissue Pre Debridement: Fat layer exposed Level of Consciousness (Pre- Awake and Alert procedure): Pre-procedure Verification/Time Out Yes - 12:04 Taken: Start Time: 12:04 Pain Control: Lidocaine 4% Topical Solution Total Area Debrided (L x W): 1.5 (cm) x 2.5 (cm) = 3.75 (cm) Tissue and other material debrided: Viable, Non-Viable, Callus, Slough, Subcutaneous, Slough Level: Skin/Subcutaneous Tissue Debridement Description: Excisional Instrument: Curette Bleeding: Minimum Hemostasis Achieved: Pressure End Time: 12:10 Procedural Pain: 0 Post Procedural Pain: 0 Response to Treatment: Procedure was  tolerated well Level of Consciousness (Post- Awake and Alert procedure): Post Debridement Measurements of Total Wound Length: (cm) 1.5 Width: (cm) 2.5 Depth: (cm) 0.2 Volume: (cm) 0.589 Character of Wound/Ulcer Post Debridement: Improved Severity of Tissue Post Debridement: Fat layer exposed Post Procedure Diagnosis Same as Pre-procedure Electronic Signature(s) Signed: 09/02/2019 1:15:24 PM By: Montey Hora Signed: 09/03/2019 8:00:28 AM By: Worthy Keeler PA-C Entered By: Montey Hora on 09/02/2019 12:09:43 Timothy Gibson ([id number]) -------------------------------------------------------------------------------- HPI Details Patient Name: Timothy Gibson Date of Service: 09/02/2019 11:45 AM Medical Record Number: [id number] Patient Account Number: [id number] Date of Birth/Sex: 1951-05-25 (69 y.o. M) Treating RN: Montey Hora Primary Care Provider: Merrie Roof Other Clinician: Referring Provider: Merrie Roof Treating Provider/Extender: Melburn Hake, Braxxton Stoudt Weeks in Treatment: 7 History of Present Illness HPI Description: 11/27/17 on evaluation today patient presents for initial evaluation concerning an issue that he has been having with his right great toe which began last Thursday 11/22/17. He has a history of diabetes mellitus type to which he has had for greater than 30 years currently he is on insulin. Subsequently he also has a history of hypertension. On physical exam inspection is also appears he may have some peripheral vascular disease with his ABI's being noncompressible bilaterally. He is on dialysis as well and this is on Monday, Wednesday, and Friday. Patient was hospitalized back in January/February 2019 although this was more for stomach/back pain though they never told me exactly what was going on. This is according to the patient. Subsequently the ulcer which is between the third and fourth toe when space of the right foot as well is on the plantar aspect of the  fourth toe is due to him having cleaned his toes this morning and he states that his finger which is large split the toe tissue in between causing the wounds that we currently see. With that being said he states this has happened before I explained I would definitely recommend that he not do this any longer. He can use a small  soft washcloth to clean between the toes without causing trauma. Subsequently the right great toe hatchery does show evidence of necrotic tissue present on the surface of the wound specifically there is callous and dead skin surrounding which is trapping fluid causing problems as far as the wound is concerned. The surface of the wound does show slough although due to his vascular flow I'm not going to sharply debride this today I think I will selectively debride away the necrotic skin as well is callous from around the surrounding so this will not continue to be a moisture issue. Nonetheless the patient has no pain he does have diabetic neuropathy. 12/06/17-He is here in follow-up evaluation for a right great toe ulcer. He is voicing no complaints or concerns. He continues to infrequently/socially smokes cigars with no desire to quit. He has been compliant with offloading, using open toed surgical shoe; has been compliant using Santyl daily. He continues on clindamycin although takes it inconsistently secondary to indigestion. The plain film x-ray performed on 5/23 impression: Soft tissue swelling of the left first toe and is noted with probable irregular lucency involving distal tuft of first distal phalanx concerning for osteomyelitis. MRI may be performed for further evaluation; MRI ordered. The vascular evaluation performed on 5/24 field non-compressible ABIs bilaterally and reduced TBI bilaterally suggesting significant tibial disease. He will be referred to vascular medicine for further evaluation. 12/13/17-He is here in follow-up evaluation for right great toe ulcer. He has  appointment next Thursday for the MRI and vascular evaluation. Wound culture obtained last week grew abundant Klebsiella oxytoca (multidrug sensitivity), and abundant enteric coccus faecalis (sensitive to ampicillin). Ampicillin and Cipro were called in, along with a probiotic. In light of his appointments next Thursday he will follow-up in 2 weeks, we will continue with Santyl 12/27/17-He is here for evaluation for a right great toe ulcer. 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 He did have evaluation by vascular medicine, Dr. Ronalee Belts, on 6/13, at that appointment it was decided for him to undergo angiography with possible intervention but he refused and is still considering. If he chooses to have it done he will contact the office. He has not heard back from either ID offices that he was referred to two weeks ago: [place] and [place]. there is improvement in both appearance and measurement to the ulcer. We will extend antibiotic therapy (amoxicillin 500 every 12, Cipro 500 daily) for additional 2 weeks pending infectious disease consult, he will continue with Santyl daily. He was reminded to continue with probiotic therapy while on oral antibiotics; he denies any GI disturbance. 01/03/18-He is here in follow-up evaluation for right great toe ulcer. He is decided to not undergo any vascular intervention at this time. He was established with [facility] infectious disease (Dr. Linus Salmons) last week initiated on vancomycin and cefazolin with dialysis treatment for 6 weeks. We will continue with current treatment plan with Santyl and offloading and he will follow-up in 2 weeks 01/17/18-He is seen in follow-up evaluation for right great toe ulcer. There is improvement in appearance with less nonviable tissue present. We will continue with same treatment plan he will follow-up next week. He is tolerating IV antibiotics without any  complications 10/26/60-IW is seen in follow up evaluation for right great toe ulcer. There continues to be improvement. He is tolerating IV antibiotics with hemodialysis, completion date of 7/31. We will transition to collagen and and follow-up next  week 02/07/18-He is here in follow up for a right great toe ulcer. There is red granulation tissue throughout the wound, improved in appearance. He completed antibiotics yesterday. He saw podiatry last Thursday for nail trimming, at that appointment he periwound callus was trimmed and a culture was obtained; culture negative. He does not have a follow up appointment with infectious disease. We will submit for grafix. 02/14/18-He is here in follow-up evaluation for a right great toe ulcer. There is slow improvement, red granulation tissue throughout. The insurance approval for grafix is pending. We will continue with collagen and he'll follow-up next week 02/21/18-He is seen in follow-up evaluation for right great toe ulcer. There is improvement with red granulation tissue throughout. He was approved for oasis and this was placed today. He will follow up next week 02/28/18-He is seen in follow-up evaluation for right great toe ulcer. The wound is stable, oasis applied and he'll follow-up next week 03/07/18-He is seen in follow-up violation of her right great toe ulcer. The wound is dry, no evidence of drainage. The wound appears healed today. He was painted with Betadine, instructed to paint with Betadine daily, cover with band-aid for the next week and then cover with band-aid for the following week continue with surgical shoe. He was encouraged to contact the clinic with any evidence of drainage, or change in appearance to toe/wound. He will be discharged from wound care services Readmission: 07/14/18 on evaluation today patient presents for initial evaluation our clinic concerning issues that he is having with his left great toe. We previously saw him in 2019  for his right great toe which was actually worse at that time and subsequently was able to be completely healed in the end although it did take several months. With that being said the patient is currently having an area on his left great toe that occurred initially as result of a blister that came up he's not really sure why or how. Subsequently this was initially treated by his podiatrist though the recommendation there was apparently amputation according to the patient and his daughter who was present with him today. With that being said the patient did not want to proceed with amputation and would like to try to perform wound care and get this to heal without having to go down that road. We were able to do that with his other Timothy Gibson, Timothy Gibson. ([id number]) toe and so he would like to at least give that a try before going forward with amputation. Again I completely understand his concern in this regard. With that being said I did discuss with the patient obviously that it's always a chance that we cannot get this to heal with normal wound care measures but obviously we will give it our best shot. He does actually have an appointment scheduled for later today with the vascular specialist in order to evaluate his blood flow. That was at the recommendation of his podiatrist as well. Obviously if he is having good arterial flow and nonetheless is still experiencing issues with having the wound delay in healing then it may simply be a matter of we need to initiate more aggressive wound to therapy in order to see if we can get this to heal. No fevers, chills, nausea, or vomiting noted at this time. The patient notes that this woman has been present for approximately three weeks. He is not a smoker. His most recent hemoglobin A1c was 6.2 on 06/18/19. 07/17/2019 on evaluation today patient appears unfortunately to  not be doing as well. He did see vascular I did review the note as well. That was on 07/15/2019.  Subsequently there can actually be taking him to the OR for an angiogram due to poor arterial flow into this extremity. Apparently the atherosclerotic changes were severe. The patient is stated to be at risk for limb loss. Nonetheless the good news is this is being scheduled fairly quickly he will have the surgery/procedure next Thursday. With that being said I am going also go ahead based on the results of his x-ray which showed some chance of osteomyelitis in the distal portion of the toe get the patient set up for an MRI to further evaluate for the possibility of osteomyelitis obviously if he does have osteomyelitis we need to know so that we can address this appropriately. 07/29/2019 upon evaluation today patient appears to be doing better in general with regard to his toe ulcer. He does have much better blood flow you can actually feel a palpable pulse which appears to be very strong at this time. I did review his arterial intervention/angiogram which did show that he had evidence of sufficient arterial flow restoration at this point. He did have occlusions that were 60 and 80% that residually were only 10 and 25% with no limiting flow. This is excellent news and hopefully he will continue to show signs of improvement in light of the improved vascular status. With that being said the toe mainly shows somewhat of an eschar today I think that this is fairly stable and as such probably would be best not to really do much in the way of debridement at this point I do believe Betadine would likely be a good option. 08/07/2019 on evaluation today patient actually appears to be doing about the same. The one difference is the eschar that we were just seeing if we can maintain is actually starting to loosen up and leak around the edges I think it is time to actually go ahead and remove this today. Is also no longer stable is very soft. Nonetheless the patient is okay with proceeding with debridement at this  point. Fortunately there is no signs of active infection. No fevers, chills, nausea, vomiting, or diarrhea. The patient states that he took his last antibiotic that he had last night. He would need a refill as he will not be seeing infectious disease until February 2. Nonetheless obviously I do not want him to have any break in therapy especially when he is doing as well as he is currently. 08/14/2019 on evaluation today patient appears to be doing quite well all things considered in regard to his toe. I do feel like he is making progress and though this is not completely healed by any means we still have some ways to go I feel like he is making progress which is excellent. There is no sign of active infection at this time systemically. He does have osteomyelitis of the distal phalanx of his great toe on the left. He did see Dr. Linus Salmons. That is the infectious disease doctor in [place] where he was referred. Dr. Linus Salmons apparently according to the note did not feel like the patient's MRI revealed osteomyelitis but just reactive marrow and subsequently did not recommend any antibiotic therapy whatsoever. To be peripherally honest I am not really in agreement with this based on the MRI results and what we are seeing and I think he has been responding at least decently well to oral medication as well. Nonetheless  we may want to check into the possibility of IV antibiotics being administered at the dialysis center. I will discuss this with Dr. Dellia Nims further. 08/21/2019 upon evaluation today patient appears at this point to be making some progress with regard to his wounds. He has been tolerating the dressing changes without complication. Fortunately there is no evidence of active infection at this time. Overall I feel like he is doing well with the oral antibiotics I discussed with Dr. Dellia Nims the possibility of doing IV antibiotic therapy versus continuing with the oral. His opinion was if the patient  is doing well with oral to maybe stick with that. Obviously we can initiate additional Cipro as well just to help ensure that there is nothing worsening here. The Cipro should be beneficial for him as well. That helps cover more gram-negative's were is at the doxycycline is more gram-positive's. 09/02/2019 on evaluation today patient appears to be doing fairly well upon inspection today. With that being said though the patient is continuing to do well he does also continue to develop issues with buildup of necrotic tissue on the distal portion of his toe he does have good arterial flow however. For that reason I am going to suggest that what we may need to do is consider using something such as Santyl to try to keep this free and clear. Also discussed in greater detail HBO therapy with him today please see plan for additional recommendations and results of this discussion. Electronic Signature(s) Signed: 09/03/2019 7:42:18 AM By: Worthy Keeler PA-C Entered By: Worthy Keeler on 09/03/2019 07:42:18 Timothy Gibson, Timothy Gibson ([id number]) -------------------------------------------------------------------------------- Physical Exam Details Patient Name: Timothy Gibson Date of Service: 09/02/2019 11:45 AM Medical Record Number: [id number] Patient Account Number: [id number] Date of Birth/Sex: 1951/03/01 (69 y.o. M) Treating RN: Montey Hora Primary Care Provider: Merrie Roof Other Clinician: Referring Provider: Merrie Roof Treating Provider/Extender: [name], Cliff Damiani Weeks in Treatment: 7 Constitutional Well-nourished and well-hydrated in no acute distress. Respiratory normal breathing without difficulty. Psychiatric this patient is able to make decisions and demonstrates good insight into disease process. Alert and Oriented x 3. pleasant and cooperative. Notes Upon inspection patient's wound did require sharp debridement to remove necrotic tissue from the distal portion of the wound. Underneath  he did seem to have decent tissue this may not be exactly the best wound bed yet but it is getting very close. I do think we can prevent this from building up at [facility] that will make a big difference for that reason I think Santyl is going to be better. Electronic Signature(s) Signed: 09/03/2019 7:42:41 AM By: Worthy Keeler PA-C Entered By: Worthy Keeler on 09/03/2019 07:42:41 Timothy Gibson, Timothy Gibson ([id number]) -------------------------------------------------------------------------------- Physician Orders Details Patient Name: Timothy Gibson Date of Service: 09/02/2019 11:45 AM Medical Record Number: [id number] Patient Account Number: [id number] Date of Birth/Sex: February 20, 1951 (68 y.o. M) Treating RN: Montey Hora Primary Care Provider: Merrie Roof Other Clinician: Referring Provider: Merrie Roof Treating Provider/Extender: Melburn Hake, Lorrayne Ismael Weeks in Treatment: 7 Verbal / Phone Orders: No Diagnosis Coding ICD-10 Coding Code Description [phone] Other chronic osteomyelitis, left ankle and foot E11.621 Type 2 diabetes mellitus with foot ulcer L97.522 Non-pressure chronic ulcer of other part of left foot with fat layer exposed I10 Essential (primary) hypertension Wound Cleansing Wound #3 Left Toe Great o Dial antibacterial soap, wash wounds, rinse and pat dry prior to dressing wounds o May Shower, gently pat wound dry prior to applying new dressing. Anesthetic (add to Medication List)  Wound #3 Left Toe Great o Topical Lidocaine 4% cream applied to wound bed prior to debridement (In Clinic Only). Primary Wound Dressing Wound #3 Left Toe Great o Santyl Ointment Secondary Dressing Wound #3 Left Toe Great o Dry Gauze o Conform/Kerlix o Foam Dressing Change Frequency Wound #3 Left Toe Great o Change dressing every day. Follow-up Appointments Wound #3 Left Toe Great o Return Appointment in 1 week. Patient Medications Allergies: No Known Drug  Allergies Notifications Medication Indication Start End Santyl 09/02/2019 DOSE topical 250 unit/gram ointment - ointment topical Apply nickel thick to the wound bed and then cover with a dressing as directed in clinic. doxycycline hyclate 09/03/2019 DOSE 1 - oral 100 mg capsule - 1 capsule oral taken 2 times per day for 14 additional days Electronic Signature(s) Signed: 09/03/2019 7:45:45 AM By: Wende Mott ([id number]) Previous Signature: 09/02/2019 12:17:14 PM Version By: Worthy Keeler PA-C Entered By: Worthy Keeler on 09/03/2019 07:45:45 Timothy Gibson, Timothy Gibson ([id number]) -------------------------------------------------------------------------------- Problem List Details Patient Name: Timothy Gibson Date of Service: 09/02/2019 11:45 AM Medical Record Number: [id number] Patient Account Number: [id number] Date of Birth/Sex: May 24, 1951 (68 y.o. M) Treating RN: Montey Hora Primary Care Provider: Merrie Roof Other Clinician: Referring Provider: Merrie Roof Treating Provider/Extender: Melburn Hake, Rakin Lemelle Weeks in Treatment: 7 Active Problems ICD-10 Evaluated Encounter Code Description Active Date Today Diagnosis [phone] Other chronic osteomyelitis, left ankle and foot 07/29/2019 No Yes E11.621 Type 2 diabetes mellitus with foot ulcer 07/15/2019 No Yes L97.522 Non-pressure chronic ulcer of other part of left foot with fat layer exposed 07/15/2019 No Yes I10 Essential (primary) hypertension 07/15/2019 No Yes Inactive Problems Resolved Problems Electronic Signature(s) Signed: 09/02/2019 12:01:04 PM By: Worthy Keeler PA-C Entered By: Worthy Keeler on 09/02/2019 12:01:03 Timothy Gibson ([id number]) -------------------------------------------------------------------------------- Progress Note Details Patient Name: Timothy Gibson Date of Service: 09/02/2019 11:45 AM Medical Record Number: [id number] Patient Account Number: [id number] Date of Birth/Sex:  08/07/1950 (68 y.o. M) Treating RN: Montey Hora Primary Care Provider: Merrie Roof Other Clinician: Referring Provider: Merrie Roof Treating Provider/Extender: Melburn Hake, Meredith Kilbride Weeks in Treatment: 7 Subjective Chief Complaint Information obtained from Patient Left Great Toe History of Present Illness (HPI) 11/27/17 on evaluation today patient presents for initial evaluation concerning an issue that he has been having with his right great toe which began last Thursday 11/22/17. He has a history of diabetes mellitus type to which he has had for greater than 30 years currently he is on insulin. Subsequently he also has a history of hypertension. On physical exam inspection is also appears he may have some peripheral vascular disease with his ABI's being noncompressible bilaterally. He is on dialysis as well and this is on Monday, Wednesday, and Friday. Patient was hospitalized back in January/February 2019 although this was more for stomach/back pain though they never told me exactly what was going on. This is according to the patient. Subsequently the ulcer which is between the third and fourth toe when space of the right foot as well is on the plantar aspect of the fourth toe is due to him having cleaned his toes this morning and he states that his finger which is large split the toe tissue in between causing the wounds that we currently see. With that being said he states this has happened before I explained I would definitely recommend that he not do this any longer. He can use a small soft washcloth to clean between the toes without causing trauma.  Subsequently the right great toe hatchery does show evidence of necrotic tissue present on the surface of the wound specifically there is callous and dead skin surrounding which is trapping fluid causing problems as far as the wound is concerned. The surface of the wound does show slough although due to his vascular flow I'm not going to sharply  debride this today I think I will selectively debride away the necrotic skin as well is callous from around the surrounding so this will not continue to be a moisture issue. Nonetheless the patient has no pain he does have diabetic neuropathy. 12/06/17-He is here in follow-up evaluation for a right great toe ulcer. He is voicing no complaints or concerns. He continues to infrequently/socially smokes cigars with no desire to quit. He has been compliant with offloading, using open toed surgical shoe; has been compliant using Santyl daily. He continues on clindamycin although takes it inconsistently secondary to indigestion. The plain film x-ray performed on 5/23 impression: Soft tissue swelling of the left first toe and is noted with probable irregular lucency involving distal tuft of first distal phalanx concerning for osteomyelitis. MRI may be performed for further evaluation; MRI ordered. The vascular evaluation performed on 5/24 field non-compressible ABIs bilaterally and reduced TBI bilaterally suggesting significant tibial disease. He will be referred to vascular medicine for further evaluation. 12/13/17-He is here in follow-up evaluation for right great toe ulcer. He has appointment next Thursday for the MRI and vascular evaluation. Wound culture obtained last week grew abundant Klebsiella oxytoca (multidrug sensitivity), and abundant enteric coccus faecalis (sensitive to ampicillin). Ampicillin and Cipro were called in, along with a probiotic. In light of his appointments next Thursday he will follow-up in 2 weeks, we will continue with Santyl 12/27/17-He is here for evaluation for a right great toe ulcer. 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 He did have evaluation by vascular medicine, Dr. Ronalee Belts, on 6/13, at that appointment it was decided for him to undergo angiography with possible intervention but he refused  and is still considering. If he chooses to have it done he will contact the office. He has not heard back from either ID offices that he was referred to two weeks ago: [place] and [place]. there is improvement in both appearance and measurement to the ulcer. We will extend antibiotic therapy (amoxicillin 500 every 12, Cipro 500 daily) for additional 2 weeks pending infectious disease consult, he will continue with Santyl daily. He was reminded to continue with probiotic therapy while on oral antibiotics; he denies any GI disturbance. 01/03/18-He is here in follow-up evaluation for right great toe ulcer. He is decided to not undergo any vascular intervention at this time. He was established with [facility] infectious disease (Dr. Linus Salmons) last week initiated on vancomycin and cefazolin with dialysis treatment for 6 weeks. We will continue with current treatment plan with Santyl and offloading and he will follow-up in 2 weeks 01/17/18-He is seen in follow-up evaluation for right great toe ulcer. There is improvement in appearance with less nonviable tissue present. We will continue with same treatment plan he will follow-up next week. He is tolerating IV antibiotics without any complications 3/84/66-ZL is seen in follow up evaluation for right great toe ulcer. There continues to be improvement. He is tolerating IV antibiotics with hemodialysis, completion date of 7/31. We will transition to collagen and and follow-up next week 02/07/18-He is here in follow up for a right  great toe ulcer. There is red granulation tissue throughout the wound, improved in appearance. He completed antibiotics yesterday. He saw podiatry last Thursday for nail trimming, at that appointment he periwound callus was trimmed and a culture was obtained; culture negative. He does not have a follow up appointment with infectious disease. We will submit for grafix. 02/14/18-He is here in follow-up evaluation for a right great toe ulcer. There  is slow improvement, red granulation tissue throughout. The insurance approval for grafix is pending. We will continue with collagen and he'll follow-up next week 02/21/18-He is seen in follow-up evaluation for right great toe ulcer. There is improvement with red granulation tissue throughout. He was approved for oasis and this was placed today. He will follow up next week 02/28/18-He is seen in follow-up evaluation for right great toe ulcer. The wound is stable, oasis applied and he'll follow-up next week 03/07/18-He is seen in follow-up violation of her right great toe ulcer. The wound is dry, no evidence of drainage. The wound appears healed today. He was painted with Betadine, instructed to paint with Betadine daily, cover with band-aid for the next week and then cover with band-aid for the following week continue with surgical shoe. He was encouraged to contact the clinic with any evidence of drainage, or change in appearance to toe/wound. He will be discharged from wound care services Readmission: 07/14/18 on evaluation today patient presents for initial evaluation our clinic concerning issues that he is having with his left great toe. We previously saw Timothy Gibson, Timothy Gibson ([id number]) him in 2019 for his right great toe which was actually worse at that time and subsequently was able to be completely healed in the end although it did take several months. With that being said the patient is currently having an area on his left great toe that occurred initially as result of a blister that came up he's not really sure why or how. Subsequently this was initially treated by his podiatrist though the recommendation there was apparently amputation according to the patient and his daughter who was present with him today. With that being said the patient did not want to proceed with amputation and would like to try to perform wound care and get this to heal without having to go down that road. We were able to  do that with his other toe and so he would like to at least give that a try before going forward with amputation. Again I completely understand his concern in this regard. With that being said I did discuss with the patient obviously that it's always a chance that we cannot get this to heal with normal wound care measures but obviously we will give it our best shot. He does actually have an appointment scheduled for later today with the vascular specialist in order to evaluate his blood flow. That was at the recommendation of his podiatrist as well. Obviously if he is having good arterial flow and nonetheless is still experiencing issues with having the wound delay in healing then it may simply be a matter of we need to initiate more aggressive wound to therapy in order to see if we can get this to heal. No fevers, chills, nausea, or vomiting noted at this time. The patient notes that this woman has been present for approximately three weeks. He is not a smoker. His most recent hemoglobin A1c was 6.2 on 06/18/19. 07/17/2019 on evaluation today patient appears unfortunately to not be doing as well. He did see vascular  I did review the note as well. That was on 07/15/2019. Subsequently there can actually be taking him to the OR for an angiogram due to poor arterial flow into this extremity. Apparently the atherosclerotic changes were severe. The patient is stated to be at risk for limb loss. Nonetheless the good news is this is being scheduled fairly quickly he will have the surgery/procedure next Thursday. With that being said I am going also go ahead based on the results of his x-ray which showed some chance of osteomyelitis in the distal portion of the toe get the patient set up for an MRI to further evaluate for the possibility of osteomyelitis obviously if he does have osteomyelitis we need to know so that we can address this appropriately. 07/29/2019 upon evaluation today patient appears to be doing  better in general with regard to his toe ulcer. He does have much better blood flow you can actually feel a palpable pulse which appears to be very strong at this time. I did review his arterial intervention/angiogram which did show that he had evidence of sufficient arterial flow restoration at this point. He did have occlusions that were 60 and 80% that residually were only 10 and 25% with no limiting flow. This is excellent news and hopefully he will continue to show signs of improvement in light of the improved vascular status. With that being said the toe mainly shows somewhat of an eschar today I think that this is fairly stable and as such probably would be best not to really do much in the way of debridement at this point I do believe Betadine would likely be a good option. 08/07/2019 on evaluation today patient actually appears to be doing about the same. The one difference is the eschar that we were just seeing if we can maintain is actually starting to loosen up and leak around the edges I think it is time to actually go ahead and remove this today. Is also no longer stable is very soft. Nonetheless the patient is okay with proceeding with debridement at this point. Fortunately there is no signs of active infection. No fevers, chills, nausea, vomiting, or diarrhea. The patient states that he took his last antibiotic that he had last night. He would need a refill as he will not be seeing infectious disease until February 2. Nonetheless obviously I do not want him to have any break in therapy especially when he is doing as well as he is currently. 08/14/2019 on evaluation today patient appears to be doing quite well all things considered in regard to his toe. I do feel like he is making progress and though this is not completely healed by any means we still have some ways to go I feel like he is making progress which is excellent. There is no sign of active infection at this time systemically. He  does have osteomyelitis of the distal phalanx of his great toe on the left. He did see Dr. Linus Salmons. That is the infectious disease doctor in [place] where he was referred. Dr. Linus Salmons apparently according to the note did not feel like the patient's MRI revealed osteomyelitis but just reactive marrow and subsequently did not recommend any antibiotic therapy whatsoever. To be peripherally honest I am not really in agreement with this based on the MRI results and what we are seeing and I think he has been responding at least decently well to oral medication as well. Nonetheless we may want to check into the possibility of  IV antibiotics being administered at the dialysis center. I will discuss this with Dr. Dellia Nims further. 08/21/2019 upon evaluation today patient appears at this point to be making some progress with regard to his wounds. He has been tolerating the dressing changes without complication. Fortunately there is no evidence of active infection at this time. Overall I feel like he is doing well with the oral antibiotics I discussed with Dr. Dellia Nims the possibility of doing IV antibiotic therapy versus continuing with the oral. His opinion was if the patient is doing well with oral to maybe stick with that. Obviously we can initiate additional Cipro as well just to help ensure that there is nothing worsening here. The Cipro should be beneficial for him as well. That helps cover more gram-negative's were is at the doxycycline is more gram-positive's. 09/02/2019 on evaluation today patient appears to be doing fairly well upon inspection today. With that being said though the patient is continuing to do well he does also continue to develop issues with buildup of necrotic tissue on the distal portion of his toe he does have good arterial flow however. For that reason I am going to suggest that what we may need to do is consider using something such as Santyl to try to keep this free and clear.  Also discussed in greater detail HBO therapy with him today please see plan for additional recommendations and results of this discussion. Objective Constitutional Well-nourished and well-hydrated in no acute distress. Vitals Time Taken: 11:42 AM, Height: 76 in, Weight: 244 lbs, BMI: 29.7, Temperature: 98.1 F, Pulse: 85 bpm, Respiratory Rate: 16 breaths/min, Blood Pressure: 154/89 mmHg. Timothy Gibson, Timothy Gibson ([id number]) Respiratory normal breathing without difficulty. Psychiatric this patient is able to make decisions and demonstrates good insight into disease process. Alert and Oriented x 3. pleasant and cooperative. General Notes: Upon inspection patient's wound did require sharp debridement to remove necrotic tissue from the distal portion of the wound. Underneath he did seem to have decent tissue this may not be exactly the best wound bed yet but it is getting very close. I do think we can prevent this from building up at [facility] that will make a big difference for that reason I think Santyl is going to be better. Integumentary (Hair, Skin) Wound #3 status is Open. Original cause of wound was Gradually Appeared. The wound is located on the Left Toe Great. The wound measures 1.5cm length x 2.5cm width x 0.1cm depth; 2.945cm^2 area and 0.295cm^3 volume. There is Fat Layer (Subcutaneous Tissue) Exposed exposed. There is a medium amount of serous drainage noted. The wound margin is flat and intact. There is small (1-33%) pink granulation within the wound bed. There is a large (67-100%) amount of necrotic tissue within the wound bed including Eschar. 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 There was a Excisional Skin/Subcutaneous Tissue Debridement with a total area of 3.75 sq cm performed by [name], Chany Woolworth E., PA-C. With the following instrument(s): Curette to remove Viable and Non-Viable tissue/material. Material removed includes Callus, Subcutaneous Tissue, and Slough after achieving pain control using Lidocaine 4% Topical Solution. No specimens were taken. A time out was conducted  at 12:04, prior to the start of the procedure. A Minimum amount of bleeding was controlled with Pressure. The procedure was tolerated well with a pain level of 0 throughout and a pain level of 0 following the procedure. Post Debridement Measurements: 1.5cm length x 2.5cm width x 0.2cm depth; 0.589cm^3 volume. Character of Wound/Ulcer Post Debridement is improved. Severity of Tissue Post Debridement is: Fat layer exposed. Post procedure Diagnosis Wound #3: Same as Pre-Procedure Plan Wound Cleansing: Wound #3 Left Toe Great: Dial antibacterial soap, wash wounds, rinse and pat dry prior to dressing wounds May Shower, gently pat wound dry prior to applying new dressing. Anesthetic (add to Medication List): Wound #3 Left Toe Great: Topical Lidocaine 4% cream applied to wound bed prior to debridement (In Clinic Only). Primary Wound Dressing: Wound #3 Left Toe Great: Santyl Ointment Secondary Dressing: Wound #3 Left Toe Great: Dry Gauze Conform/Kerlix Timothy Gibson, Timothy Gibson. ([id number]) Foam Dressing Change Frequency: Wound #3 Left Toe Great: Change dressing every day. Follow-up Appointments: Wound #3 Left Toe Great: Return Appointment in 1 week. The following medication(s) was prescribed: Santyl topical 250 unit/gram ointment ointment topical Apply nickel thick to the wound bed and then cover with a dressing as directed in clinic. starting 09/02/2019 doxycycline hyclate oral 100 mg capsule 1 1 capsule oral  taken 2 times per day for 14 additional days starting 09/03/2019 1. The patient is currently on antibiotic therapy as prescribed by myself for chronic osteomyelitis. With that being said I do think that he needs to continue with such therapy currently. I have discussed again in greater detail with him today the possibility of hyperbaric oxygen therapy. However with his dialysis coupled with the fact that he lives about an hour away which would mean roughly a 4 to 5-hour commitment every day just for hyperbarics he is not sure he to be able to really do this feasibly. I completely understand. With that being said there can I think about it and see how things are next week they be in the patient and his daughter we will see where things stand. 2. I did switch him to [facility] for the treatment of choice for the great toe and we will see how this does over the next week as well as far as keeping the eschar from building up. 3. I am also going to recommend that we go ahead and continue with the offloading using the postop shoe I think that still the best option at this time. 4. I am keeping him on the antibiotic therapy at this point I did go ahead and represcribed the doxycycline today as well. We will see patient back for reevaluation in 1 week here in the clinic. If anything worsens or changes patient will contact our office for additional recommendations. Electronic Signature(s) Signed: 09/03/2019 7:46:01 AM By: Worthy Keeler PA-C Entered By: Worthy Keeler on 09/03/2019 07:46:01 Timothy Gibson, Timothy Gibson ([id number]) -------------------------------------------------------------------------------- SuperBill Details Patient Name: Timothy Gibson Date of Service: 09/02/2019 Medical Record Number: [id number] Patient Account Number: [id number] Date of Birth/Sex: June 28, 1951 (68 y.o. M) Treating RN: Montey Hora Primary Care Provider: Merrie Roof Other Clinician: Referring Provider: Merrie Roof Treating  Provider/Extender: Melburn Hake, Ayisha Pol Weeks in Treatment: 7 Diagnosis Coding ICD-10 Codes Code Description [id number] Other chronic osteomyelitis, left ankle and foot E11.621 Type 2 diabetes mellitus with foot ulcer L97.522 Non-pressure chronic ulcer of other part of left foot with fat layer exposed I10 Essential (primary) hypertension Facility Procedures CPT4 Code: [id number] Description: [place]  TISSUE 20 SQ CM/< Modifier: Quantity: 1 CPT4 Code: Description: ICD-10 Diagnosis Description L97.522 Non-pressure chronic ulcer of other part of left foot with fat layer exposed Modifier: Quantity: Physician Procedures CPT4 Code: 3500938 Description: 18299 - WC PHYS LEVEL 4 - EST PT Modifier: 25 Quantity: 1 CPT4 Code: Description: ICD-10 Diagnosis Description M86.672 Other chronic osteomyelitis, left ankle and foot E11.621 Type 2 diabetes mellitus with foot ulcer L97.522 Non-pressure chronic ulcer of other part of left foot with fat layer exposed I10 Essential  (primary) hypertension Modifier: Quantity: CPT4 Code: 3716967 Description: 11042 - WC PHYS SUBQ TISS 20 SQ CM Modifier: Quantity: 1 CPT4 Code: Description: ICD-10 Diagnosis Description L97.522 Non-pressure chronic ulcer of other part of left foot with fat layer exposed Modifier: Quantity: Electronic Signature(s) Signed: 09/03/2019 7:46:53 AM By: Worthy Keeler PA-C Previous Signature: 09/03/2019 7:46:20 AM Version By: Worthy Keeler PA-C Entered By: Worthy Keeler on 09/03/2019 07:46:53

## 2019-09-03 DIAGNOSIS — D631 Anemia in chronic kidney disease: Secondary | ICD-10-CM | POA: Diagnosis not present

## 2019-09-03 DIAGNOSIS — D509 Iron deficiency anemia, unspecified: Secondary | ICD-10-CM | POA: Diagnosis not present

## 2019-09-03 DIAGNOSIS — Z992 Dependence on renal dialysis: Secondary | ICD-10-CM | POA: Diagnosis not present

## 2019-09-03 DIAGNOSIS — N2581 Secondary hyperparathyroidism of renal origin: Secondary | ICD-10-CM | POA: Diagnosis not present

## 2019-09-03 DIAGNOSIS — N186 End stage renal disease: Secondary | ICD-10-CM | POA: Diagnosis not present

## 2019-09-04 ENCOUNTER — Ambulatory Visit: Payer: Medicare Other | Admitting: Physician Assistant

## 2019-09-05 DIAGNOSIS — Z992 Dependence on renal dialysis: Secondary | ICD-10-CM | POA: Diagnosis not present

## 2019-09-05 DIAGNOSIS — N186 End stage renal disease: Secondary | ICD-10-CM | POA: Diagnosis not present

## 2019-09-05 DIAGNOSIS — N2581 Secondary hyperparathyroidism of renal origin: Secondary | ICD-10-CM | POA: Diagnosis not present

## 2019-09-05 DIAGNOSIS — D631 Anemia in chronic kidney disease: Secondary | ICD-10-CM | POA: Diagnosis not present

## 2019-09-05 DIAGNOSIS — D509 Iron deficiency anemia, unspecified: Secondary | ICD-10-CM | POA: Diagnosis not present

## 2019-09-07 DIAGNOSIS — Z992 Dependence on renal dialysis: Secondary | ICD-10-CM | POA: Diagnosis not present

## 2019-09-07 DIAGNOSIS — N186 End stage renal disease: Secondary | ICD-10-CM | POA: Diagnosis not present

## 2019-09-08 DIAGNOSIS — N186 End stage renal disease: Secondary | ICD-10-CM | POA: Diagnosis not present

## 2019-09-08 DIAGNOSIS — N2581 Secondary hyperparathyroidism of renal origin: Secondary | ICD-10-CM | POA: Diagnosis not present

## 2019-09-08 DIAGNOSIS — D631 Anemia in chronic kidney disease: Secondary | ICD-10-CM | POA: Diagnosis not present

## 2019-09-08 DIAGNOSIS — Z992 Dependence on renal dialysis: Secondary | ICD-10-CM | POA: Diagnosis not present

## 2019-09-08 DIAGNOSIS — D509 Iron deficiency anemia, unspecified: Secondary | ICD-10-CM | POA: Diagnosis not present

## 2019-09-10 DIAGNOSIS — N186 End stage renal disease: Secondary | ICD-10-CM | POA: Diagnosis not present

## 2019-09-10 DIAGNOSIS — D631 Anemia in chronic kidney disease: Secondary | ICD-10-CM | POA: Diagnosis not present

## 2019-09-10 DIAGNOSIS — D509 Iron deficiency anemia, unspecified: Secondary | ICD-10-CM | POA: Diagnosis not present

## 2019-09-10 DIAGNOSIS — N2581 Secondary hyperparathyroidism of renal origin: Secondary | ICD-10-CM | POA: Diagnosis not present

## 2019-09-10 DIAGNOSIS — Z992 Dependence on renal dialysis: Secondary | ICD-10-CM | POA: Diagnosis not present

## 2019-09-11 ENCOUNTER — Other Ambulatory Visit: Payer: Self-pay

## 2019-09-11 ENCOUNTER — Encounter: Payer: Medicare Other | Attending: Physician Assistant | Admitting: Physician Assistant

## 2019-09-11 DIAGNOSIS — M069 Rheumatoid arthritis, unspecified: Secondary | ICD-10-CM | POA: Insufficient documentation

## 2019-09-11 DIAGNOSIS — L97522 Non-pressure chronic ulcer of other part of left foot with fat layer exposed: Secondary | ICD-10-CM | POA: Insufficient documentation

## 2019-09-11 DIAGNOSIS — Z794 Long term (current) use of insulin: Secondary | ICD-10-CM | POA: Insufficient documentation

## 2019-09-11 DIAGNOSIS — M1990 Unspecified osteoarthritis, unspecified site: Secondary | ICD-10-CM | POA: Insufficient documentation

## 2019-09-11 DIAGNOSIS — I120 Hypertensive chronic kidney disease with stage 5 chronic kidney disease or end stage renal disease: Secondary | ICD-10-CM | POA: Diagnosis not present

## 2019-09-11 DIAGNOSIS — E11621 Type 2 diabetes mellitus with foot ulcer: Secondary | ICD-10-CM | POA: Insufficient documentation

## 2019-09-11 DIAGNOSIS — E1169 Type 2 diabetes mellitus with other specified complication: Secondary | ICD-10-CM | POA: Diagnosis not present

## 2019-09-11 DIAGNOSIS — N186 End stage renal disease: Secondary | ICD-10-CM | POA: Insufficient documentation

## 2019-09-11 DIAGNOSIS — Z992 Dependence on renal dialysis: Secondary | ICD-10-CM | POA: Insufficient documentation

## 2019-09-11 DIAGNOSIS — E1122 Type 2 diabetes mellitus with diabetic chronic kidney disease: Secondary | ICD-10-CM | POA: Diagnosis not present

## 2019-09-11 DIAGNOSIS — F17290 Nicotine dependence, other tobacco product, uncomplicated: Secondary | ICD-10-CM | POA: Diagnosis not present

## 2019-09-11 DIAGNOSIS — M86672 Other chronic osteomyelitis, left ankle and foot: Secondary | ICD-10-CM | POA: Insufficient documentation

## 2019-09-11 NOTE — Progress Notes (Addendum)
JAMAS, JAQUAY ([id number]) Visit Report for 09/11/2019 Chief Complaint Document Details Patient Name: Timothy Gibson, Timothy Gibson Date of Service: 09/11/2019 10:45 AM Medical Record Number: [id number] Patient Account Number: [id number] Date of Birth/Sex: June 30, 1951 (69 y.o. M) Treating RN: Army Melia Primary Care Provider: Merrie Roof Other Clinician: Referring Provider: Merrie Roof Treating Provider/Extender: Melburn Hake, Nickolaus Bordelon Weeks in Treatment: 8 Information Obtained from: Patient Chief Complaint Left Great Toe Electronic Signature(s) Signed: 09/11/2019 11:03:10 AM By: Worthy Keeler Timothy Gibson Entered By: Worthy Keeler on 09/11/2019 11:03:10 Timothy Gibson ([id number]) -------------------------------------------------------------------------------- Debridement Details Patient Name: Timothy Gibson Date of Service: 09/11/2019 10:45 AM Medical Record Number: [id number] Patient Account Number: [id number] Date of Birth/Sex: 1950-08-12 (68 y.o. M) Treating RN: Army Melia Primary Care Provider: Merrie Roof Other Clinician: Referring Provider: Merrie Roof Treating Provider/Extender: Melburn Hake, Maliya Marich Weeks in Treatment: 8 Debridement Performed for Wound #3 Left Toe Great Assessment: Performed By: Physician Timothy Gibson, Navina Wohlers E., Timothy Gibson Debridement Type: Debridement Severity of Tissue Pre Debridement: Fat layer exposed Level of Consciousness (Pre- Awake and Alert procedure): Pre-procedure Verification/Time Out Yes - 11:03 Taken: Start Time: 11:04 Pain Control: Lidocaine Total Area Debrided (L x W): 1.7 (cm) x 2.5 (cm) = 4.25 (cm) Tissue and other material debrided: Viable, Non-Viable, Slough, Subcutaneous, Slough Level: Skin/Subcutaneous Tissue Debridement Description: Excisional Instrument: Forceps, Scissors Bleeding: Minimum Hemostasis Achieved: Pressure End Time: 11:05 Response to Treatment: Procedure was tolerated well Level of Consciousness (Post- Awake and  Alert procedure): Post Debridement Measurements of Total Wound Length: (cm) 1.7 Width: (cm) 2.5 Depth: (cm) 0.3 Volume: (cm) 1.001 Character of Wound/Ulcer Post Debridement: Stable Severity of Tissue Post Debridement: Fat layer exposed Post Procedure Diagnosis Same as Pre-procedure Electronic Signature(s) Signed: 09/11/2019 3:26:37 PM By: Army Melia Signed: 09/11/2019 5:37:52 PM By: Worthy Keeler Timothy Gibson Entered By: Army Melia on 09/11/2019 11:05:18 Timothy Gibson ([id number]) -------------------------------------------------------------------------------- HPI Details Patient Name: Timothy Gibson Date of Service: 09/11/2019 10:45 AM Medical Record Number: [id number] Patient Account Number: [id number] Date of Birth/Sex: 10/07/1950 (69 y.o. M) Treating RN: Army Melia Primary Care Provider: Merrie Roof Other Clinician: Referring Provider: Merrie Roof Treating Provider/Extender: Melburn Hake, Eleuterio Dollar Weeks in Treatment: 8 History of Present Illness HPI Description: 11/27/17 on evaluation today patient presents for initial evaluation concerning an issue that he has been having with his right great toe which began last Thursday 11/22/17. He has a history of diabetes mellitus type to which he has had for greater than 30 years currently he is on insulin. Subsequently he also has a history of hypertension. On physical exam inspection is also appears he may have some peripheral vascular disease with his ABI's being noncompressible bilaterally. He is on dialysis as well and this is on Monday, Wednesday, and Friday. Patient was hospitalized back in January/February 2019 although this was more for stomach/back pain though they never told me exactly what was going on. This is according to the patient. Subsequently the ulcer which is between the third and fourth toe when space of the right foot as well is on the plantar aspect of the fourth toe is due to him having cleaned his toes this morning and  he states that his finger which is large split the toe tissue in between causing the wounds that we currently see. With that being said he states this has happened before I explained I would definitely recommend that he not do this any longer. He can use a small soft washcloth to clean between the toes without causing trauma.  Subsequently the right great toe hatchery does show evidence of necrotic tissue present on the surface of the wound specifically there is callous and dead skin surrounding which is trapping fluid causing problems as far as the wound is concerned. The surface of the wound does show slough although due to his vascular flow I'm not going to sharply debride this today I think I will selectively debride away the necrotic skin as well is callous from around the surrounding so this will not continue to be a moisture issue. Nonetheless the patient has no pain he does have diabetic neuropathy. 12/06/17-He is here in follow-up evaluation for a right great toe ulcer. He is voicing no complaints or concerns. He continues to infrequently/socially smokes cigars with no desire to quit. He has been compliant with offloading, using open toed surgical shoe; has been compliant using Santyl daily. He continues on clindamycin although takes it inconsistently secondary to indigestion. The plain film x-ray performed on 5/23 impression: Soft tissue swelling of the left first toe and is noted with probable irregular lucency involving distal tuft of first distal phalanx concerning for osteomyelitis. MRI may be performed for further evaluation; MRI ordered. The vascular evaluation performed on 5/24 field non-compressible ABIs bilaterally and reduced TBI bilaterally suggesting significant tibial disease. He will be referred to vascular medicine for further evaluation. 12/13/17-He is here in follow-up evaluation for right great toe ulcer. He has appointment next Thursday for the MRI and vascular evaluation.  Wound culture obtained last week grew abundant Klebsiella oxytoca (multidrug sensitivity), and abundant enteric coccus faecalis (sensitive to ampicillin). Ampicillin and Cipro were called in, along with a probiotic. In light of his appointments next Thursday he will follow-up in 2 weeks, we will continue with Santyl 12/27/17-He is here for evaluation for a right great toe ulcer. 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 He did have evaluation by vascular medicine, Dr. Ronalee Belts, on 6/13, at that appointment it was decided for him to undergo angiography with possible intervention but he refused and is still considering. If he chooses to have it done he will contact the office. He has not heard back from either ID offices that he was referred to two weeks ago: [place] and [place]. there is improvement in both appearance and measurement to the ulcer. We will extend antibiotic therapy (amoxicillin 500 every 12, Cipro 500 daily) for additional 2 weeks pending infectious disease consult, he will continue with Santyl daily. He was reminded to continue with probiotic therapy while on oral antibiotics; he denies any GI disturbance. 01/03/18-He is here in follow-up evaluation for right great toe ulcer. He is decided to not undergo any vascular intervention at this time. He was established with [facility] infectious disease (Dr. Linus Salmons) last week initiated on vancomycin and cefazolin with dialysis treatment for 6 weeks. We will continue with current treatment plan with Santyl and offloading and he will follow-up in 2 weeks 01/17/18-He is seen in follow-up evaluation for right great toe ulcer. There is improvement in appearance with less nonviable tissue present. We will continue with same treatment plan he will follow-up next week. He is tolerating IV antibiotics without any complications 02/21/47-JE is seen in follow up evaluation for  right great toe ulcer. There continues to be improvement. He is tolerating IV antibiotics with hemodialysis, completion date of 7/31. We will transition to collagen and and follow-up next week 02/07/18-He is here in follow up for a right  great toe ulcer. There is red granulation tissue throughout the wound, improved in appearance. He completed antibiotics yesterday. He saw podiatry last Thursday for nail trimming, at that appointment he periwound callus was trimmed and a culture was obtained; culture negative. He does not have a follow up appointment with infectious disease. We will submit for grafix. 02/14/18-He is here in follow-up evaluation for a right great toe ulcer. There is slow improvement, red granulation tissue throughout. The insurance approval for grafix is pending. We will continue with collagen and he'll follow-up next week 02/21/18-He is seen in follow-up evaluation for right great toe ulcer. There is improvement with red granulation tissue throughout. He was approved for oasis and this was placed today. He will follow up next week 02/28/18-He is seen in follow-up evaluation for right great toe ulcer. The wound is stable, oasis applied and he'll follow-up next week 03/07/18-He is seen in follow-up violation of her right great toe ulcer. The wound is dry, no evidence of drainage. The wound appears healed today. He was painted with Betadine, instructed to paint with Betadine daily, cover with band-aid for the next week and then cover with band-aid for the following week continue with surgical shoe. He was encouraged to contact the clinic with any evidence of drainage, or change in appearance to toe/wound. He will be discharged from wound care services Readmission: 07/14/18 on evaluation today patient presents for initial evaluation our clinic concerning issues that he is having with his left great toe. We previously saw him in 2019 for his right great toe which was actually worse at that time  and subsequently was able to be completely healed in the end although it did take several months. With that being said the patient is currently having an area on his left great toe that occurred initially as result of a blister that came up he's not really sure why or how. Subsequently this was initially treated by his podiatrist though the recommendation there was apparently amputation according to the patient and his daughter who was present with him today. With that being said the patient did not want to proceed with amputation and would like to try to perform wound care and get this to heal without having to go down that road. We were able to do that with his other Timothy Gibson, Timothy Gibson. ([id number]) toe and so he would like to at least give that a try before going forward with amputation. Again I completely understand his concern in this regard. With that being said I did discuss with the patient obviously that it's always a chance that we cannot get this to heal with normal wound care measures but obviously we will give it our best shot. He does actually have an appointment scheduled for later today with the vascular specialist in order to evaluate his blood flow. That was at the recommendation of his podiatrist as well. Obviously if he is having good arterial flow and nonetheless is still experiencing issues with having the wound delay in healing then it may simply be a matter of we need to initiate more aggressive wound to therapy in order to see if we can get this to heal. No fevers, chills, nausea, or vomiting noted at this time. The patient notes that this woman has been present for approximately three weeks. He is not a smoker. His most recent hemoglobin A1c was 6.2 on 06/18/19. 07/17/2019 on evaluation today patient appears unfortunately to not be doing as well. He did see vascular I  did review the note as well. That was on 07/15/2019. Subsequently there can actually be taking him to the OR for an  angiogram due to poor arterial flow into this extremity. Apparently the atherosclerotic changes were severe. The patient is stated to be at risk for limb loss. Nonetheless the good news is this is being scheduled fairly quickly he will have the surgery/procedure next Thursday. With that being said I am going also go ahead based on the results of his x-ray which showed some chance of osteomyelitis in the distal portion of the toe get the patient set up for an MRI to further evaluate for the possibility of osteomyelitis obviously if he does have osteomyelitis we need to know so that we can address this appropriately. 07/29/2019 upon evaluation today patient appears to be doing better in general with regard to his toe ulcer. He does have much better blood flow you can actually feel a palpable pulse which appears to be very strong at this time. I did review his arterial intervention/angiogram which did show that he had evidence of sufficient arterial flow restoration at this point. He did have occlusions that were 60 and 80% that residually were only 10 and 25% with no limiting flow. This is excellent news and hopefully he will continue to show signs of improvement in light of the improved vascular status. With that being said the toe mainly shows somewhat of an eschar today I think that this is fairly stable and as such probably would be best not to really do much in the way of debridement at this point I do believe Betadine would likely be a good option. 08/07/2019 on evaluation today patient actually appears to be doing about the same. The one difference is the eschar that we were just seeing if we can maintain is actually starting to loosen up and leak around the edges I think it is time to actually go ahead and remove this today. Is also no longer stable is very soft. Nonetheless the patient is okay with proceeding with debridement at this point. Fortunately there is no signs of active infection.  No fevers, chills, nausea, vomiting, or diarrhea. The patient states that he took his last antibiotic that he had last night. He would need a refill as he will not be seeing infectious disease until February 2. Nonetheless obviously I do not want him to have any break in therapy especially when he is doing as well as he is currently. 08/14/2019 on evaluation today patient appears to be doing quite well all things considered in regard to his toe. I do feel like he is making progress and though this is not completely healed by any means we still have some ways to go I feel like he is making progress which is excellent. There is no sign of active infection at this time systemically. He does have osteomyelitis of the distal phalanx of his great toe on the left. He did see Dr. Linus Salmons. That is the infectious disease doctor in [place] where he was referred. Dr. Linus Salmons apparently according to the note did not feel like the patient's MRI revealed osteomyelitis but just reactive marrow and subsequently did not recommend any antibiotic therapy whatsoever. To be peripherally honest I am not really in agreement with this based on the MRI results and what we are seeing and I think he has been responding at least decently well to oral medication as well. Nonetheless we may want to check into the possibility of IV  antibiotics being administered at the dialysis center. I will discuss this with Dr. Dellia Nims further. 08/21/2019 upon evaluation today patient appears at this point to be making some progress with regard to his wounds. He has been tolerating the dressing changes without complication. Fortunately there is no evidence of active infection at this time. Overall I feel like he is doing well with the oral antibiotics I discussed with Dr. Dellia Nims the possibility of doing IV antibiotic therapy versus continuing with the oral. His opinion was if the patient is doing well with oral to maybe stick with that. Obviously we  can initiate additional Cipro as well just to help ensure that there is nothing worsening here. The Cipro should be beneficial for him as well. That helps cover more gram-negative's were is at the doxycycline is more gram-positive's. 09/02/2019 on evaluation today patient appears to be doing fairly well upon inspection today. With that being said though the patient is continuing to do well he does also continue to develop issues with buildup of necrotic tissue on the distal portion of his toe he does have good arterial flow however. For that reason I am going to suggest that what we may need to do is consider using something such as Santyl to try to keep this free and clear. Also discussed in greater detail HBO therapy with him today please see plan for additional recommendations and results of this discussion. 09/11/2019 upon evaluation today patient appears to be doing well with regard to his toe ulcer. This actually appears to be much better today using the Santyl than it was previous. Fortunately there is no signs of active infection. No fevers, chills, nausea, vomiting, or diarrhea. Electronic Signature(s) Signed: 09/11/2019 5:20:03 PM By: Worthy Keeler Timothy Gibson Entered By: Worthy Keeler on 09/11/2019 17:20:02 Timothy Gibson, Timothy Gibson ([id number]) -------------------------------------------------------------------------------- Physical Exam Details Patient Name: Timothy Gibson Date of Service: 09/11/2019 10:45 AM Medical Record Number: [id number] Patient Account Number: [id number] Date of Birth/Sex: February 24, 1951 (69 y.o. M) Treating RN: Army Melia Primary Care Provider: Merrie Roof Other Clinician: Referring Provider: Merrie Roof Treating Provider/Extender: Melburn Hake, Harshal Sirmon Weeks in Treatment: 8 Constitutional Well-nourished and well-hydrated in no acute distress. Respiratory normal breathing without difficulty. Psychiatric this patient is able to make decisions and demonstrates good insight  into disease process. Alert and Oriented x 3. pleasant and cooperative. Notes Patient's wound again showed signs of good granulation and epithelization around the edge she did have some slough noted on the central portion of the wound and I did use scissors and forceps to clean away some of the necrotic debris as best I could obviously the goal is to make and not bleed too much the Santyl seems to be helping I do not want to have to use silver nitrate on the area which typically we have to fight go to aggressive with the debridements. Nonetheless he was able to tolerate this today with remove quite a bit of the necrotic debris and he tolerated that without complication. No excessive bleeding was noted. Electronic Signature(s) Signed: 09/11/2019 5:20:43 PM By: Worthy Keeler Timothy Gibson Entered By: Worthy Keeler on 09/11/2019 17:20:43 Timothy Gibson, Timothy Gibson ([id number]) -------------------------------------------------------------------------------- Physician Orders Details Patient Name: Timothy Gibson Date of Service: 09/11/2019 10:45 AM Medical Record Number: [id number] Patient Account Number: [id number] Date of Birth/Sex: 1950/07/19 (68 y.o. M) Treating RN: Army Melia Primary Care Provider: Merrie Roof Other Clinician: Referring Provider: Merrie Roof Treating Provider/Extender: Melburn Hake, Candas Deemer Weeks in Treatment: 8 Verbal / Phone Orders:  No Diagnosis Coding ICD-10 Coding Code Description [phone] Other chronic osteomyelitis, left ankle and foot E11.621 Type 2 diabetes mellitus with foot ulcer L97.522 Non-pressure chronic ulcer of other part of left foot with fat layer exposed I10 Essential (primary) hypertension Wound Cleansing Wound #3 Left Toe Great o Dial antibacterial soap, wash wounds, rinse and pat dry prior to dressing wounds o May Shower, gently pat wound dry prior to applying new dressing. Anesthetic (add to Medication List) Wound #3 Left Toe Great o Topical Lidocaine 4%  cream applied to wound bed prior to debridement (In Clinic Only). Primary Wound Dressing Wound #3 Left Toe Great o Santyl Ointment Secondary Dressing Wound #3 Left Toe Great o Dry Gauze o Conform/Kerlix o Foam Dressing Change Frequency Wound #3 Left Toe Great o Change dressing every day. Follow-up Appointments Wound #3 Left Toe Great o Return Appointment in 1 week. Electronic Signature(s) Signed: 09/11/2019 3:26:37 PM By: Army Melia Signed: 09/11/2019 5:37:52 PM By: Worthy Keeler Timothy Gibson Entered By: Army Melia on 09/11/2019 11:06:19 Timothy Gibson ([id number]) -------------------------------------------------------------------------------- Problem List Details Patient Name: Timothy Gibson Date of Service: 09/11/2019 10:45 AM Medical Record Number: [id number] Patient Account Number: [id number] Date of Birth/Sex: 08-23-1950 (69 y.o. M) Treating RN: Army Melia Primary Care Provider: Merrie Roof Other Clinician: Referring Provider: Merrie Roof Treating Provider/Extender: Melburn Hake, Sakia Schrimpf Weeks in Treatment: 8 Active Problems ICD-10 Evaluated Encounter Code Description Active Date Today Diagnosis [id number] Other chronic osteomyelitis, left ankle and foot 07/29/2019 No Yes E11.621 Type 2 diabetes mellitus with foot ulcer 07/15/2019 No Yes L97.522 Non-pressure chronic ulcer of other part of left foot with fat layer exposed 07/15/2019 No Yes I10 Essential (primary) hypertension 07/15/2019 No Yes Inactive Problems Resolved Problems Electronic Signature(s) Signed: 09/11/2019 11:03:04 AM By: Worthy Keeler Timothy Gibson Entered By: Worthy Keeler on 09/11/2019 11:03:03 Timothy Gibson ([id number]) -------------------------------------------------------------------------------- Progress Note Details Patient Name: Timothy Gibson Date of Service: 09/11/2019 10:45 AM Medical Record Number: [id number] Patient Account Number: [id number] Date of Birth/Sex: Feb 13, 1951 (68 y.o.  M) Treating RN: Army Melia Primary Care Provider: Merrie Roof Other Clinician: Referring Provider: Merrie Roof Treating Provider/Extender: Melburn Hake, Saket Hellstrom Weeks in Treatment: 8 Subjective Chief Complaint Information obtained from Patient Left Great Toe History of Present Illness (HPI) 11/27/17 on evaluation today patient presents for initial evaluation concerning an issue that he has been having with his right great toe which began last Thursday 11/22/17. He has a history of diabetes mellitus type to which he has had for greater than 30 years currently he is on insulin. Subsequently he also has a history of hypertension. On physical exam inspection is also appears he may have some peripheral vascular disease with his ABI's being noncompressible bilaterally. He is on dialysis as well and this is on Monday, Wednesday, and Friday. Patient was hospitalized back in January/February 2019 although this was more for stomach/back pain though they never told me exactly what was going on. This is according to the patient. Subsequently the ulcer which is between the third and fourth toe when space of the right foot as well is on the plantar aspect of the fourth toe is due to him having cleaned his toes this morning and he states that his finger which is large split the toe tissue in between causing the wounds that we currently see. With that being said he states this has happened before I explained I would definitely recommend that he not do this any longer. He can use a small soft washcloth  to clean between the toes without causing trauma. Subsequently the right great toe hatchery does show evidence of necrotic tissue present on the surface of the wound specifically there is callous and dead skin surrounding which is trapping fluid causing problems as far as the wound is concerned. The surface of the wound does show slough although due to his vascular flow I'm not going to sharply debride this today I  think I will selectively debride away the necrotic skin as well is callous from around the surrounding so this will not continue to be a moisture issue. Nonetheless the patient has no pain he does have diabetic neuropathy. 12/06/17-He is here in follow-up evaluation for a right great toe ulcer. He is voicing no complaints or concerns. He continues to infrequently/socially smokes cigars with no desire to quit. He has been compliant with offloading, using open toed surgical shoe; has been compliant using Santyl daily. He continues on clindamycin although takes it inconsistently secondary to indigestion. The plain film x-ray performed on 5/23 impression: Soft tissue swelling of the left first toe and is noted with probable irregular lucency involving distal tuft of first distal phalanx concerning for osteomyelitis. MRI may be performed for further evaluation; MRI ordered. The vascular evaluation performed on 5/24 field non-compressible ABIs bilaterally and reduced TBI bilaterally suggesting significant tibial disease. He will be referred to vascular medicine for further evaluation. 12/13/17-He is here in follow-up evaluation for right great toe ulcer. He has appointment next Thursday for the MRI and vascular evaluation. Wound culture obtained last week grew abundant Klebsiella oxytoca (multidrug sensitivity), and abundant enteric coccus faecalis (sensitive to ampicillin). Ampicillin and Cipro were called in, along with a probiotic. In light of his appointments next Thursday he will follow-up in 2 weeks, we will continue with Santyl 12/27/17-He is here for evaluation for a right great toe ulcer. 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 He did have evaluation by vascular medicine, Dr. Ronalee Belts, on 6/13, at that appointment it was decided for him to undergo angiography with possible intervention but he refused and is still  considering. If he chooses to have it done he will contact the office. He has not heard back from either ID offices that he was referred to two weeks ago: [place] and [place]. there is improvement in both appearance and measurement to the ulcer. We will extend antibiotic therapy (amoxicillin 500 every 12, Cipro 500 daily) for additional 2 weeks pending infectious disease consult, he will continue with Santyl daily. He was reminded to continue with probiotic therapy while on oral antibiotics; he denies any GI disturbance. 01/03/18-He is here in follow-up evaluation for right great toe ulcer. He is decided to not undergo any vascular intervention at this time. He was established with [facility] infectious disease (Dr. Linus Salmons) last week initiated on vancomycin and cefazolin with dialysis treatment for 6 weeks. We will continue with current treatment plan with Santyl and offloading and he will follow-up in 2 weeks 01/17/18-He is seen in follow-up evaluation for right great toe ulcer. There is improvement in appearance with less nonviable tissue present. We will continue with same treatment plan he will follow-up next week. He is tolerating IV antibiotics without any complications 9/60/45-[facility] is seen in follow up evaluation for right great toe ulcer. There continues to be improvement. He is tolerating IV antibiotics with hemodialysis, completion date of 7/31. We will transition to collagen and and follow-up next week 02/07/18-He  is here in follow up for a right great toe ulcer. There is red granulation tissue throughout the wound, improved in appearance. He completed antibiotics yesterday. He saw podiatry last Thursday for nail trimming, at that appointment he periwound callus was trimmed and a culture was obtained; culture negative. He does not have a follow up appointment with infectious disease. We will submit for grafix. 02/14/18-He is here in follow-up evaluation for a right great toe ulcer. There is slow  improvement, red granulation tissue throughout. The insurance approval for grafix is pending. We will continue with collagen and he'll follow-up next week 02/21/18-He is seen in follow-up evaluation for right great toe ulcer. There is improvement with red granulation tissue throughout. He was approved for oasis and this was placed today. He will follow up next week 02/28/18-He is seen in follow-up evaluation for right great toe ulcer. The wound is stable, oasis applied and he'll follow-up next week 03/07/18-He is seen in follow-up violation of her right great toe ulcer. The wound is dry, no evidence of drainage. The wound appears healed today. He was painted with Betadine, instructed to paint with Betadine daily, cover with band-aid for the next week and then cover with band-aid for the following week continue with surgical shoe. He was encouraged to contact the clinic with any evidence of drainage, or change in appearance to toe/wound. He will be discharged from wound care services Readmission: 07/14/18 on evaluation today patient presents for initial evaluation our clinic concerning issues that he is having with his left great toe. We previously saw Timothy Gibson, Timothy Gibson ([id number]) him in 2019 for his right great toe which was actually worse at that time and subsequently was able to be completely healed in the end although it did take several months. With that being said the patient is currently having an area on his left great toe that occurred initially as result of a blister that came up he's not really sure why or how. Subsequently this was initially treated by his podiatrist though the recommendation there was apparently amputation according to the patient and his daughter who was present with him today. With that being said the patient did not want to proceed with amputation and would like to try to perform wound care and get this to heal without having to go down that road. We were able to do that  with his other toe and so he would like to at least give that a try before going forward with amputation. Again I completely understand his concern in this regard. With that being said I did discuss with the patient obviously that it's always a chance that we cannot get this to heal with normal wound care measures but obviously we will give it our best shot. He does actually have an appointment scheduled for later today with the vascular specialist in order to evaluate his blood flow. That was at the recommendation of his podiatrist as well. Obviously if he is having good arterial flow and nonetheless is still experiencing issues with having the wound delay in healing then it may simply be a matter of we need to initiate more aggressive wound to therapy in order to see if we can get this to heal. No fevers, chills, nausea, or vomiting noted at this time. The patient notes that this woman has been present for approximately three weeks. He is not a smoker. His most recent hemoglobin A1c was 6.2 on 06/18/19. 07/17/2019 on evaluation today patient appears unfortunately to not  be doing as well. He did see vascular I did review the note as well. That was on 07/15/2019. Subsequently there can actually be taking him to the OR for an angiogram due to poor arterial flow into this extremity. Apparently the atherosclerotic changes were severe. The patient is stated to be at risk for limb loss. Nonetheless the good news is this is being scheduled fairly quickly he will have the surgery/procedure next Thursday. With that being said I am going also go ahead based on the results of his x-ray which showed some chance of osteomyelitis in the distal portion of the toe get the patient set up for an MRI to further evaluate for the possibility of osteomyelitis obviously if he does have osteomyelitis we need to know so that we can address this appropriately. 07/29/2019 upon evaluation today patient appears to be doing better in  general with regard to his toe ulcer. He does have much better blood flow you can actually feel a palpable pulse which appears to be very strong at this time. I did review his arterial intervention/angiogram which did show that he had evidence of sufficient arterial flow restoration at this point. He did have occlusions that were 60 and 80% that residually were only 10 and 25% with no limiting flow. This is excellent news and hopefully he will continue to show signs of improvement in light of the improved vascular status. With that being said the toe mainly shows somewhat of an eschar today I think that this is fairly stable and as such probably would be best not to really do much in the way of debridement at this point I do believe Betadine would likely be a good option. 08/07/2019 on evaluation today patient actually appears to be doing about the same. The one difference is the eschar that we were just seeing if we can maintain is actually starting to loosen up and leak around the edges I think it is time to actually go ahead and remove this today. Is also no longer stable is very soft. Nonetheless the patient is okay with proceeding with debridement at this point. Fortunately there is no signs of active infection. No fevers, chills, nausea, vomiting, or diarrhea. The patient states that he took his last antibiotic that he had last night. He would need a refill as he will not be seeing infectious disease until February 2. Nonetheless obviously I do not want him to have any break in therapy especially when he is doing as well as he is currently. 08/14/2019 on evaluation today patient appears to be doing quite well all things considered in regard to his toe. I do feel like he is making progress and though this is not completely healed by any means we still have some ways to go I feel like he is making progress which is excellent. There is no sign of active infection at this time systemically. He does have  osteomyelitis of the distal phalanx of his great toe on the left. He did see Dr. Linus Salmons. That is the infectious disease doctor in [place] where he was referred. Dr. Linus Salmons apparently according to the note did not feel like the patient's MRI revealed osteomyelitis but just reactive marrow and subsequently did not recommend any antibiotic therapy whatsoever. To be peripherally honest I am not really in agreement with this based on the MRI results and what we are seeing and I think he has been responding at least decently well to oral medication as well. Nonetheless we  may want to check into the possibility of IV antibiotics being administered at the dialysis center. I will discuss this with Dr. Dellia Nims further. 08/21/2019 upon evaluation today patient appears at this point to be making some progress with regard to his wounds. He has been tolerating the dressing changes without complication. Fortunately there is no evidence of active infection at this time. Overall I feel like he is doing well with the oral antibiotics I discussed with Dr. Dellia Nims the possibility of doing IV antibiotic therapy versus continuing with the oral. His opinion was if the patient is doing well with oral to maybe stick with that. Obviously we can initiate additional Cipro as well just to help ensure that there is nothing worsening here. The Cipro should be beneficial for him as well. That helps cover more gram-negative's were is at the doxycycline is more gram-positive's. 09/02/2019 on evaluation today patient appears to be doing fairly well upon inspection today. With that being said though the patient is continuing to do well he does also continue to develop issues with buildup of necrotic tissue on the distal portion of his toe he does have good arterial flow however. For that reason I am going to suggest that what we may need to do is consider using something such as Santyl to try to keep this free and clear. Also discussed in  greater detail HBO therapy with him today please see plan for additional recommendations and results of this discussion. 09/11/2019 upon evaluation today patient appears to be doing well with regard to his toe ulcer. This actually appears to be much better today using the Santyl than it was previous. Fortunately there is no signs of active infection. No fevers, chills, nausea, vomiting, or diarrhea. Objective Constitutional Well-nourished and well-hydrated in no acute distress. Vitals Time Taken: 10:53 AM, Height: 76 in, Weight: 244 lbs, BMI: 29.7, Temperature: 97.7 F, Pulse: 96 bpm, Respiratory Rate: 16 breaths/min, Blood Timothy Gibson, Timothy Gibson. ([id number]) Pressure: 192/54 mmHg. Respiratory normal breathing without difficulty. Psychiatric this patient is able to make decisions and demonstrates good insight into disease process. Alert and Oriented x 3. pleasant and cooperative. General Notes: Patient's wound again showed signs of good granulation and epithelization around the edge she did have some slough noted on the central portion of the wound and I did use scissors and forceps to clean away some of the necrotic debris as best I could obviously the goal is to make and not bleed too much the Santyl seems to be helping I do not want to have to use silver nitrate on the area which typically we have to fight go to aggressive with the debridements. Nonetheless he was able to tolerate this today with remove quite a bit of the necrotic debris and he tolerated that without complication. No excessive bleeding was noted. Integumentary (Hair, Skin) Wound #3 status is Open. Original cause of wound was Gradually Appeared. The wound is located on the Left Toe Great. The wound measures 1.7cm length x 2.5cm width x 0.3cm depth; 3.338cm^2 area and 1.001cm^3 volume. There is Fat Layer (Subcutaneous Tissue) Exposed exposed. There is no tunneling or undermining noted. There is a medium amount of serous drainage  noted. The wound margin is flat and intact. There is small (1-33%) pink granulation within the wound bed. There is a large (67-100%) amount of necrotic tissue within the wound bed including Adherent Slough. 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 There was a Excisional Skin/Subcutaneous Tissue Debridement with a total area of 4.25 sq cm performed by Timothy Gibson, Timothy Gharibian E., Timothy Gibson. With the following instrument(s): Forceps, and Scissors to remove Viable and Non-Viable tissue/material. Material removed includes Subcutaneous Tissue and Slough and after achieving pain control using Lidocaine. A time out was conducted at 11:03, prior to the start of the procedure. A Minimum amount of bleeding was controlled with Pressure. The procedure was tolerated well. Post Debridement Measurements: 1.7cm length x 2.5cm width x 0.3cm depth; 1.001cm^3 volume. Character of Wound/Ulcer Post Debridement is stable. Severity of Tissue Post Debridement is: Fat layer exposed. Post procedure Diagnosis Wound #3: Same as Pre-Procedure Plan Wound Cleansing: Wound #3 Left Toe Great: Dial antibacterial soap, wash wounds, rinse and pat dry prior to dressing wounds May Shower, gently pat wound dry prior to applying new dressing. Anesthetic (add to Medication List): Wound #3 Left Toe Great: Topical Lidocaine 4% cream applied to wound bed prior to debridement (In Clinic Only). Primary Wound Dressing: Wound #3 Left Toe Great: [address] Timothy Gibson, Timothy ([id number]) Secondary Dressing: Wound #3 Left Toe Great: Dry  Gauze Conform/Kerlix Foam Dressing Change Frequency: Wound #3 Left Toe Great: Change dressing every day. Follow-up Appointments: Wound #3 Left Toe Great: Return Appointment in 1 week. 1. My suggestion at this time is good to be that we going to continue with the current wound care measures which includes the Santyl followed by a saline moistened gauze. I am very pleased with the progress he is made in the last week with this. 2. I am also going to suggest that we continue with the Dial antibacterial soap to clean the wound whenever changed saline can also be utilized. 3. We will continue to cover this with foam along with roll gauze. 4. The patient will continue to use his antibiotics which have been prescribed by myself as well. We will see patient back for reevaluation in 1 week here in the clinic. If anything worsens or changes patient will contact our office for additional recommendations. Electronic Signature(s) Signed: 09/11/2019 5:21:31 PM By: Worthy Keeler Timothy Gibson Entered By: Worthy Keeler on 09/11/2019 17:21:31 Timothy Gibson, QUINTANAR ([id number]) -------------------------------------------------------------------------------- SuperBill Details Patient Name: Timothy Gibson Date of Service: 09/11/2019 Medical Record Number: [id number] Patient Account Number: [id number] Date of Birth/Sex: [birth year] (68 y.o. 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 I10 Essential (primary) hypertension Facility Procedures CPT4 Code: [id number] Description: 73220 - DEB SUBQ TISSUE 20 SQ CM/< Modifier: Quantity: 1 CPT4  Code: Description: ICD-10 Diagnosis Description L97.522 Non-pressure chronic ulcer of other part of left foot with fat layer exposed Modifier: Quantity: Physician Procedures CPT4 Code: 2542706 Description: 11042 - WC PHYS SUBQ TISS 20 SQ CM Modifier: Quantity: 1 CPT4 Code: Description: ICD-10 Diagnosis Description L97.522 Non-pressure chronic ulcer of other part of left foot with fat layer exposed Modifier: Quantity: Electronic Signature(s) Signed: 09/11/2019 5:21:42 PM By: Worthy Keeler  Timothy Gibson Entered By: Worthy Keeler on 09/11/2019 17:21:42

## 2019-09-11 NOTE — Progress Notes (Signed)
Timothy Gibson, Timothy Gibson ([id number]) Visit Report for 09/11/2019 Arrival Information Details Patient Name: Timothy Gibson, Timothy Gibson Date of Service: 09/11/2019 10:45 AM Medical Record Number: [id number] Patient Account Number: [id number] Date of Birth/Sex: [birth year] (69 y.o. M) Treating RN: Timothy Gibson Primary Care Kawena Lyday: Timothy Gibson Other Clinician: Referring Maven Timothy Gibson: Timothy Gibson Treating Timothy Gibson/Extender: Timothy Gibson, Timothy Gibson Timothy Gibson in Treatment: 8 Visit Information History Since Last Visit Added or deleted any medications: No Patient Arrived: Cane Any new allergies or adverse reactions: No Arrival Time: 10:51 Had a fall or experienced change in No Accompanied By: caregiver activities of daily living that may affect Transfer Assistance: None risk of falls: Patient Identification Verified: Yes Signs or symptoms of abuse/neglect since last visito No Secondary Verification Process Completed: Yes Hospitalized since last visit: No Implantable device outside of the clinic excluding No cellular tissue based products placed in the center since last visit: Has Dressing in Place as Prescribed: Yes Pain Present Now: No Electronic Signature(s) Signed: 09/11/2019 4:34:11 PM By: Timothy Gibson Entered By: Timothy Gibson on 09/11/2019 10:52:24 Timothy Gibson ([id number]) -------------------------------------------------------------------------------- Encounter Discharge Information Details Patient Name: Timothy Gibson Date of Service: 09/11/2019 10:45 AM Medical Record Number: [id number] Patient Account Number: [id number] Date of Birth/Sex: 1951-01-02 (69 y.o. M) Treating RN: Army Melia Primary Care Keora Eccleston: Timothy Gibson Other Clinician: Referring Niketa Turner: Timothy Gibson Treating Waneda Klammer/Extender: Timothy Gibson, Timothy Gibson Timothy Gibson in Treatment: 8 Encounter Discharge Information Items Post Procedure Vitals Discharge Condition: Stable Temperature (F): 97.7 Ambulatory Status: Ambulatory Pulse (bpm):  96 Discharge Destination: Home Respiratory Rate (breaths/min): 16 Transportation: Private Auto Blood Pressure (mmHg): 192/64 Accompanied By: family Schedule Follow-up Appointment: Yes Clinical Summary of Care: Electronic Signature(s) Signed: 09/11/2019 3:26:37 PM By: Army Melia Entered By: Army Melia on 09/11/2019 11:07:06 Timothy Gibson ([id number]) -------------------------------------------------------------------------------- Lower Extremity Assessment Details Patient Name: Timothy Gibson Date of Service: 09/11/2019 10:45 AM Medical Record Number: [id number] Patient Account Number: [id number] Date of Birth/Sex: [birth year] (68 y.o. M) Treating RN: Timothy Gibson Primary Care Vergie Zahm: Timothy Gibson Other Clinician: Referring Karee Christopherson: Timothy Gibson Treating Dawt Reeb/Extender: [name], Timothy Gibson Timothy Gibson in Treatment: 8 Edema Assessment Assessed: [Left: No] [Right: No] Edema: [Left: Ye] [Right: s] Vascular Assessment Pulses: Dorsalis Pedis Palpable: [Left:Yes] Electronic Signature(s) Signed: 09/11/2019 4:34:11 PM By: Timothy Gibson Entered By: Timothy Gibson on 09/11/2019 11:00:11 Timothy Gibson ([id number]) -------------------------------------------------------------------------------- Multi Wound Chart Details Patient Name: Timothy Gibson Date of Service: 09/11/2019 10:45 AM Medical Record Number: [id number] Patient Account Number: [id number] Date of Birth/Sex: 01/24/1951 (69 y.o. M) Treating RN: Army Melia Primary Care Ellah Otte: Timothy Gibson Other Clinician: Referring Ennis Heavner: Timothy Gibson Treating Kateleen Encarnacion/Extender: Timothy Gibson, Timothy Gibson Timothy Gibson in Treatment: 8 Vital Signs Height(in): 76 Pulse(bpm): 96 Weight(lbs): 244 Blood Pressure(mmHg): 192/54 Body Mass Index(BMI): 30 Temperature(F): 97.7 Respiratory Rate(breaths/min): 16 Photos: [N/A:N/A] Wound Location: Left Toe Great N/A N/A Wounding Event: Gradually Appeared N/A N/A Primary Etiology: Diabetic  Wound/Ulcer of the Lower N/A N/A Extremity Comorbid History: Lymphedema, Arrhythmia, N/A N/A Hypertension, Type II Diabetes, End Stage Renal Disease, Rheumatoid Arthritis, Osteoarthritis, Confinement Anxiety Date Acquired: 06/19/2019 N/A N/A Timothy Gibson of Treatment: 8 N/A N/A Wound Status: Open N/A N/A Measurements L x W x D (cm) 1.7x2.5x0.3 N/A N/A Area (cm) : 3.338 N/A N/A Volume (cm) : 1.001 N/A N/A % Reduction in Area: -23.20% N/A N/A % Reduction in Volume: -269.40% N/A N/A Classification: Grade 2 N/A N/A Exudate Amount: Medium N/A N/A Exudate Type: Serous N/A N/A Exudate Color: amber N/A N/A Wound Margin: Flat and Intact N/A N/A Granulation Amount: Small (1-33%)  N/A N/A Granulation Quality: Pink N/A N/A Necrotic Amount: Large (67-100%) N/A N/A Exposed Structures: Fat Layer (Subcutaneous Tissue) N/A N/A Exposed: Yes Fascia: No Tendon: No Muscle: No Joint: No Bone: No Epithelialization: Small (1-33%) N/A N/A Treatment Notes Electronic Signature(s) [name], [name] ([id number]) Signed: 09/11/2019 3:26:37 PM By: Army Melia Entered By: Army Melia on 09/11/2019 11:04:11 Timothy Gibson ([id number]) -------------------------------------------------------------------------------- Multi-Disciplinary Care Plan Details Patient Name: Timothy Gibson Date of Service: 09/11/2019 10:45 AM Medical Record Number: [id number] Patient Account Number: [id number] Date of Birth/Sex: [birth year] (69 y.o. M) Treating RN: Army Melia Primary Care Chaney Ingram: Timothy Gibson Other Clinician: Referring Jolene Guyett: Timothy Gibson Treating Michaelangelo Mittelman/Extender: Timothy Gibson, Timothy Gibson Timothy Gibson in Treatment: 8 Active Inactive Abuse / Safety / Falls / Self Care Management Nursing Diagnoses: Potential for falls Goals: Patient will not experience any injury related to falls Date Initiated: 07/15/2019 Target Resolution Date: 10/18/2019 Goal Status: Active Interventions: Assess fall risk on admission and as  needed Notes: Necrotic Tissue Nursing Diagnoses: Impaired tissue integrity related to necrotic/devitalized tissue Goals: Necrotic/devitalized tissue will be minimized in the wound bed Date Initiated: 07/15/2019 Target Resolution Date: 10/18/2019 Goal Status: Active Interventions: Provide education on necrotic tissue and debridement process Notes: Orientation to the Wound Care Program Nursing Diagnoses: Knowledge deficit related to the wound healing center program Goals: Patient/caregiver will verbalize understanding of the [place] Program Date Initiated: 07/15/2019 Target Resolution Date: 10/18/2019 Goal Status: Active Interventions: Provide education on orientation to the wound center Notes: Peripheral Neuropathy Nursing Diagnoses: Knowledge deficit related to disease process and management of peripheral neurovascular dysfunction JUNO, BOZARD ([id number]) Goals: Patient/caregiver will verbalize understanding of disease process and disease management Date Initiated: 07/15/2019 Target Resolution Date: 10/18/2019 Goal Status: Active Interventions: Assess signs and symptoms of neuropathy upon admission and as needed Provide education on Management of Neuropathy and Related Ulcers Notes: Wound/Skin Impairment Nursing Diagnoses: Impaired tissue integrity Goals: Ulcer/skin breakdown will heal within 14 Timothy Gibson Date Initiated: 07/15/2019 Target Resolution Date: 10/18/2019 Goal Status: Active Interventions: Assess patient/caregiver ability to obtain necessary supplies Assess patient/caregiver ability to perform ulcer/skin care regimen upon admission and as needed Assess ulceration(s) every visit Notes: Electronic Signature(s) Signed: 09/11/2019 3:26:37 PM By: Army Melia Entered By: Army Melia on 09/11/2019 11:04:02 Timothy Gibson ([id number]) -------------------------------------------------------------------------------- Pain Assessment Details Patient Name:  Timothy Gibson Date of Service: 09/11/2019 10:45 AM Medical Record Number: [id number] Patient Account Number: [id number] Date of Birth/Sex: September 22, 1950 (69 y.o. M) Treating RN: Timothy Gibson Primary Care Diamonte Stavely: Timothy Gibson Other Clinician: Referring Bethel Gaglio: Timothy Gibson Treating Thurl Boen/Extender: Timothy Gibson, Timothy Gibson Timothy Gibson in Treatment: 8 Active Problems Location of Pain Severity and Description of Pain Patient Has Paino No Site Locations Pain Management and Medication Current Pain Management: Electronic Signature(s) Signed: 09/11/2019 4:34:11 PM By: Timothy Gibson Entered By: Timothy Gibson on 09/11/2019 10:52:32 Timothy Gibson ([id number]) -------------------------------------------------------------------------------- Patient/Caregiver Education Details Patient Name: Timothy Gibson Date of Service: 09/11/2019 10:45 AM Medical Record Number: [id number] Patient Account Number: [id number] Date of Birth/Gender: [birth year] (69 y.o. M) Treating RN: Army Melia Primary Care Physician: Timothy Gibson Other Clinician: Referring Physician: Merrie Gibson Treating Physician/Extender: Sharalyn Ink in Treatment: 8 Education Assessment Education Provided To: Patient Education Topics Provided Wound/Skin Impairment: Handouts: Caring for Your Ulcer Methods: Demonstration, Explain/Verbal Responses: State content correctly Electronic Signature(s) Signed: 09/11/2019 3:26:37 PM By: Army Melia Entered By: Army Melia on 09/11/2019 11:06:34 Timothy Gibson ([id number]) -------------------------------------------------------------------------------- Wound Assessment Details Patient Name: Timothy Gibson Date of Service: 09/11/2019 10:45 AM Medical  Record Number: [id number] Patient Account Number: [id number] Date of Birth/Sex: May 20, 1951 (68 y.o. M) Treating RN: Timothy Gibson Primary Care Reese Senk: Timothy Gibson Other Clinician: Referring Euva Rundell: Timothy Gibson Treating  Malaiyah Achorn/Extender: Timothy Gibson, Timothy Gibson Timothy Gibson in Treatment: 8 Wound Status Wound Number: 3 Primary Diabetic Wound/Ulcer of the Lower Extremity Etiology: Wound Location: Left Toe Great Wound Open Wounding Event: Gradually Appeared Status: Date Acquired: 06/19/2019 Comorbid Lymphedema, Arrhythmia, Hypertension, Type II Diabetes, Timothy Gibson Of Treatment: 8 History: End Stage Renal Disease, Rheumatoid Arthritis, Clustered Wound: No Osteoarthritis, Confinement Anxiety Photos Wound Measurements Length: (cm) 1.7 % Re Width: (cm) 2.5 % Re Depth: (cm) 0.3 Epit Area: (cm) 3.338 Tun Volume: (cm) 1.001 Und duction in Area: -23.2% duction in Volume: -269.4% helialization: Small (1-33%) neling: No ermining: No Wound Description Classification: Grade 2 Foul Wound Margin: Flat and Intact Slou Exudate Amount: Medium Exudate Type: Serous Exudate Color: amber Odor After Cleansing: No gh/Fibrino Yes Wound Bed Granulation Amount: Small (1-33%) Exposed Structure Granulation Quality: Pink Fascia Exposed: No Necrotic Amount: Large (67-100%) Fat Layer (Subcutaneous Tissue) Exposed: Yes Necrotic Quality: Adherent Slough Tendon Exposed: No Muscle Exposed: No Joint Exposed: No Bone Exposed: No Treatment Notes Wound #3 (Left Toe Great) Notes santyl, gauze, foam, conform, offloading shoe Electronic Signature(s) DELSIN, COPEN ([id number]) Signed: 09/11/2019 4:34:11 PM By: Timothy Gibson Entered By: Timothy Gibson on 09/11/2019 10:59:09 Timothy Gibson ([id number]) -------------------------------------------------------------------------------- Vitals Details Patient Name: Timothy Gibson Date of Service: 09/11/2019 10:45 AM Medical Record Number: [id number] Patient Account Number: [id number] Date of Birth/Sex: 1951-04-05 (69 y.o. M) Treating RN: Timothy Gibson Primary Care Daryle Amis: Timothy Gibson Other Clinician: Referring Larrie Lucia: Timothy Gibson Treating Lawton Dollinger/Extender: Timothy Gibson,  Timothy Gibson Timothy Gibson in Treatment: 8 Vital Signs Time Taken: 10:53 Temperature (F): 97.7 Height (in): 76 Pulse (bpm): 96 Weight (lbs): 244 Respiratory Rate (breaths/min): 16 Body Mass Index (BMI): 29.7 Blood Pressure (mmHg): 192/54 Reference Range: 80 - 120 mg / dl Electronic Signature(s) Signed: 09/11/2019 4:34:11 PM By: Timothy Gibson Entered By: Timothy Gibson on 09/11/2019 10:53:58

## 2019-09-12 DIAGNOSIS — N186 End stage renal disease: Secondary | ICD-10-CM | POA: Diagnosis not present

## 2019-09-12 DIAGNOSIS — Z992 Dependence on renal dialysis: Secondary | ICD-10-CM | POA: Diagnosis not present

## 2019-09-12 DIAGNOSIS — D631 Anemia in chronic kidney disease: Secondary | ICD-10-CM | POA: Diagnosis not present

## 2019-09-12 DIAGNOSIS — N2581 Secondary hyperparathyroidism of renal origin: Secondary | ICD-10-CM | POA: Diagnosis not present

## 2019-09-12 DIAGNOSIS — D509 Iron deficiency anemia, unspecified: Secondary | ICD-10-CM | POA: Diagnosis not present

## 2019-09-15 DIAGNOSIS — D509 Iron deficiency anemia, unspecified: Secondary | ICD-10-CM | POA: Diagnosis not present

## 2019-09-15 DIAGNOSIS — D631 Anemia in chronic kidney disease: Secondary | ICD-10-CM | POA: Diagnosis not present

## 2019-09-15 DIAGNOSIS — Z992 Dependence on renal dialysis: Secondary | ICD-10-CM | POA: Diagnosis not present

## 2019-09-15 DIAGNOSIS — N186 End stage renal disease: Secondary | ICD-10-CM | POA: Diagnosis not present

## 2019-09-15 DIAGNOSIS — N2581 Secondary hyperparathyroidism of renal origin: Secondary | ICD-10-CM | POA: Diagnosis not present

## 2019-09-17 ENCOUNTER — Ambulatory Visit (INDEPENDENT_AMBULATORY_CARE_PROVIDER_SITE_OTHER): Payer: Medicare Other

## 2019-09-17 ENCOUNTER — Telehealth: Payer: Self-pay | Admitting: Family Medicine

## 2019-09-17 VITALS — BP 140/80 | Wt 245.0 lb

## 2019-09-17 DIAGNOSIS — N2581 Secondary hyperparathyroidism of renal origin: Secondary | ICD-10-CM | POA: Diagnosis not present

## 2019-09-17 DIAGNOSIS — Z0000 Encounter for general adult medical examination without abnormal findings: Secondary | ICD-10-CM

## 2019-09-17 DIAGNOSIS — Z992 Dependence on renal dialysis: Secondary | ICD-10-CM | POA: Diagnosis not present

## 2019-09-17 DIAGNOSIS — N186 End stage renal disease: Secondary | ICD-10-CM | POA: Diagnosis not present

## 2019-09-17 DIAGNOSIS — D631 Anemia in chronic kidney disease: Secondary | ICD-10-CM | POA: Diagnosis not present

## 2019-09-17 DIAGNOSIS — D509 Iron deficiency anemia, unspecified: Secondary | ICD-10-CM | POA: Diagnosis not present

## 2019-09-17 NOTE — Telephone Encounter (Signed)
   09/17/2019  Name: Timothy Gibson   MRN: [id number]   DOB: 16-Sep-1950   AGE: 69 y.o.   GENDER: male   PCP Volney American, PA-C.   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 Pt stated that he was starting dialysis and asked that I c/b later.  [place] . [email or web address]  [phone]

## 2019-09-17 NOTE — Telephone Encounter (Signed)
   09/17/2019  Name: OLUWADAMILOLA ROSAMOND   MRN: [id number]   DOB: [birth year]   AGE: 69 y.o.   GENDER: male   PCP Volney American, PA-C.   Called pt regarding [employer] Referral for food. Spoke with caregiver Jennefer Bravo, she stated that she prepares most meals for Mr. Gramling and that he is on dialysis. Pt had received Meals on Wheels in the past but did not care for the freezer meals.  Ms. Alvina Filbert would like a copy of meal calendar and food pantrys, CG to email lists. She agreed to [employer] referral.  Ms. Alvina Filbert was also interested in financial assistance paying for her utility bills. Will include in email to her. Closing referral pending any other needs of patient.  [place] . [email or web address]  [phone]

## 2019-09-17 NOTE — Progress Notes (Signed)
Subjective:   Timothy Gibson is a 69 y.o. male who presents for an Initial Medicare Annual Wellness Visit.  This visit is being conducted via phone call  - after an attmept to do on video chat - due to the COVID-19 pandemic. This patient has given me verbal consent via phone to conduct this visit, patient states they are participating from their home address. Some vital signs may be absent or patient reported.   Patient identification: identified by name, DOB, and current address.    Review of Systems   Cardiac Risk Factors include: male gender;advanced age (>22men, >59 women);dyslipidemia;hypertension;diabetes mellitus    Objective:    Today's Vitals   09/17/19 1120  BP: 140/80  Weight: 245 lb (111.1 kg)   Body mass index is 29.82 kg/m.  Advanced Directives 09/17/2019 06/04/2018 05/23/2017 05/16/2017 03/20/2017 03/20/2017 10/08/2015  Does Patient Have a Medical Advance Directive? No Yes Yes Yes Yes Yes No  Type of Advance Directive - [place];Living will Living will;Healthcare Power of [place] will [place];Living will -  Does patient want to make changes to medical advance directive? - No - Patient declined - - No - Patient declined - -  Copy of Downs in Chart? - No - copy requested No - copy requested No - copy requested No - copy requested No - copy requested -  Would patient like information on creating a medical advance directive? - - - - No - Patient declined No - Patient declined No - patient declined information    Current Medications (verified) Outpatient Encounter Medications as of 09/17/2019  Medication Sig  . acetaminophen (TYLENOL) 500 MG tablet Take 500 mg by mouth daily as needed for moderate pain.  Marland Kitchen albuterol (VENTOLIN HFA) 108 (90 Base) MCG/ACT inhaler Inhale 2 puffs into the lungs every 6 (six) hours as needed for wheezing or shortness of breath.  Marland Kitchen amLODipine  (NORVASC) 5 MG tablet Take 1 tablet (5 mg total) by mouth daily.  Marland Kitchen aspirin EC 81 MG tablet Take 1 tablet (81 mg total) by mouth daily.  . ciprofloxacin (CIPRO) 500 MG tablet Take 500 mg by mouth daily.  Marland Kitchen doxycycline (VIBRAMYCIN) 100 MG capsule Take 100 mg by mouth 2 (two) times daily.  . insulin degludec (TRESIBA) 100 UNIT/ML SOPN FlexTouch Pen Inject 0.12 mLs (12 Units total) into the skin at bedtime.  Marland Kitchen losartan (COZAAR) 100 MG tablet Take 1 tablet (100 mg total) by mouth daily.  . Probiotic Product (PROBIOTIC DAILY PO) Take by mouth daily.  . sevelamer carbonate (RENVELA) 800 MG tablet Take 3,200 mg by mouth 3 (three) times daily with meals.   Marland Kitchen umeclidinium-vilanterol (ANORO ELLIPTA) 62.5-25 MCG/INH AEPB INHALE 1 PUFF INTO THE LUNGS DAILY  . EPINEPHrine 0.3 mg/0.3 mL IJ SOAJ injection Inject 0.3 mg into the muscle as needed for anaphylaxis.  . [DISCONTINUED] atorvastatin (LIPITOR) 10 MG tablet Take 1 tablet (10 mg total) by mouth daily. (Patient not taking: Reported on 08/28/2019)   No facility-administered encounter medications on file as of 09/17/2019.    Allergies (verified) Adhesive [tape]   History: Past Medical History:  Diagnosis Date  . 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 Diabetes mellitus without complication ([place])   . Dialysis patient [facility])    on Mon-Wed-Fri dialysis  . ESRD (end stage renal disease) ([place])   . History of gangrene   . Hypertension    Past Surgical History:  Procedure Laterality Date  . A/V FISTULAGRAM Left 06/04/2018   Procedure: A/V FISTULAGRAM;  Surgeon: Katha Cabal, MD;  Location: [facility] LAB;  Service: Cardiovascular;  Laterality: Left;  . COLONOSCOPY WITH PROPOFOL N/A 12/16/2015   Procedure: COLONOSCOPY WITH PROPOFOL;  Surgeon: Manya Silvas, MD;  Location: [facility] ENDOSCOPY;  Service: Endoscopy;  Laterality: N/A;  . KNEE SURGERY    . left forearm AV fistula    . LOWER EXTREMITY ANGIOGRAPHY Left 07/24/2019   Procedure: LOWER EXTREMITY  ANGIOGRAPHY;  Surgeon: Algernon Huxley, MD;  Location: [facility] LAB;  Service: Cardiovascular;  Laterality: Left;  . NEPHRECTOMY     Family History  Problem Relation Age of Onset  . Hypertension Father   . Colon cancer Father   . Hypertension Mother   . Parkinson's disease Mother   . Diabetes Mother   . Alzheimer's disease Mother    Social History   Socioeconomic History  . Marital status: Divorced    Spouse name: Not on file  . Number of children: Not on file  . Years of education: Not on file  . Highest education level: Not on file  Occupational History  . Not on file  Tobacco Use  . Smoking status: Current Some Day Smoker    Types: Cigars  . Smokeless tobacco: Former Systems developer  . Tobacco comment: cigar smoker  Substance and Sexual Activity  . Alcohol use: Not Currently  . Drug use: Never  . Sexual activity: Not Currently  Other Topics Concern  . Not on file  Social History Narrative   Independent, lives with brother at home.   Social Determinants of Health   Financial Resource Strain:   . Difficulty of Paying Living Expenses: Not on file  Food Insecurity:   . Worried About Charity fundraiser in the Last Year: Not on file  . Ran Out of Food in the Last Year: Not on file  Transportation Needs:   . Lack of Transportation (Medical): Not on file  . Lack of Transportation (Non-Medical): Not on file  Physical Activity:   . Days of Exercise per Week: Not on file  . Minutes of Exercise per Session: Not on file  Stress:   . Feeling of Stress : Not on file  Social Connections:   . Frequency of Communication with Friends and Family: Not on file  . Frequency of Social Gatherings with Friends and Family: Not on file  . Attends Religious Services: Not on file  . Active Member of Clubs or Organizations: Not on file  . Attends Archivist Meetings: Not on file  . Marital Status: Not on file   Tobacco Counseling Ready to quit: No Counseling given: Yes Comment:  cigar smoker   Clinical Intake:  Pre-visit preparation completed: Yes  Pain : No/denies pain     Nutritional Risks: None Diabetes: Yes CBG done?: No Did pt. bring in CBG monitor from home?: No  How often do you need to have someone help you when you read instructions, pamphlets, or other written materials from your doctor or pharmacy?: 1 - Never  Nutrition Risk Assessment:  Has the patient had any N/V/D within the last 2 months?  No  Does the patient have any non-healing wounds?  No  Has the patient had any unintentional weight loss or weight gain?  No   Diabetes:  Is the patient diabetic?  Yes  If diabetic, was a CBG obtained today?  No  Did the patient  bring in their glucometer from home?  No .  Financial Strains and Diabetes Management:  Are you having any financial strains with the device, your supplies or your medication? No .  Does the patient want to be seen by Chronic Care Management for management of their diabetes?  No  Would the patient like to be referred to a Nutritionist or for Diabetic Management?  No   Diabetic Exams:  Diabetic Eye Exam: . Overdue for diabetic eye exam. Pt has been advised about the importance in completing this exam.  Diabetic Foot Exam: Completed 06/2019.   Interpreter Needed?: No  Information entered by :: Manas Hickling,LPN  Activities of Daily Living In your present state of health, do you have any difficulty performing the following activities: 09/17/2019 07/24/2019  Hearing? Y N  Comment no hearing aids -  Vision? Y N  Comment eyeglasses, [employer] vision -  Difficulty concentrating or making decisions? N N  Walking or climbing stairs? N N  Dressing or bathing? N N  Doing errands, shopping? Y -  Comment has help on days cant drive, -  Preparing Food and eating ? N -  Using the Toilet? N -  In the past six months, have you accidently leaked urine? N -  Do you have problems with loss of bowel control? N -  Managing your  Medications? Y -  Comment Olegario Shearer does -  Managing your Finances? [place] does Control and instrumentation engineer? N -  Comment some, Olegario Shearer helps -  Some recent data might be hidden     Immunizations and Health Maintenance Immunization History  Administered Date(s) Administered  . Influenza, Seasonal, Injecte, Preservative Fre 03/28/2019  . Influenza-Unspecified 03/15/2016, 04/02/2017, 04/01/2018, 03/28/2019  . Moderna SARS-COVID-2 Vaccination 08/22/2019  . PPD Test 09/04/2011, 01/01/2012, 08/14/2012, 07/23/2014, 04/12/2015, 08/02/2016, 07/16/2017, 07/17/2018  . Pneumococcal Conjugate-13 06/16/2019  . Pneumococcal Polysaccharide-23 05/09/2010, 09/02/2012, 07/18/2017  . Pneumococcal-Unspecified 05/09/2010, 09/02/2012, 07/18/2017   Health Maintenance Due  Topic Date Due  . OPHTHALMOLOGY EXAM  12/08/1960    Patient Care Team: Volney American, PA-C as PCP - General (Family Medicine)  Indicate any recent Medical Services you may have received from other than [facility] providers in the past year (date may be approximate).    Assessment:   This is a routine wellness examination for Alexsandro.  Hearing/Vision screen No exam data present  Dietary issues and exercise activities discussed: Current Exercise Habits: Home exercise routine, Type of exercise: stretching;walking, Time (Minutes): 10, Frequency (Times/Week): 2, Weekly Exercise (Minutes/Week): 20, Intensity: Mild, Exercise limited by: None identified  Goals Addressed   None    Depression Screen PHQ 2/9 Scores 09/17/2019 08/15/2018  PHQ - 2 Score 0 2  PHQ- 9 Score - 9    Fall Risk Fall Risk  11/19/2018 08/15/2018  Falls in the past year? 1 0  Number falls in past yr: 0 0  Injury with Fall? 0 0  Risk for fall due to : History of fall(s) -  Follow up Falls evaluation completed -    FALL RISK PREVENTION PERTAINING TO THE HOME:  Any stairs in or around the home? Yes  If so, are there any without  handrails? No   Home free of loose throw rugs in walkways, pet beds, electrical cords, etc? Yes  Adequate lighting in your home to reduce risk of falls? Yes   ASSISTIVE DEVICES UTILIZED TO PREVENT FALLS:  Life alert? No  Use of a cane, walker  or w/c? Yes  cane  Grab bars in the bathroom? No  Shower chair or bench in shower? No  Elevated toilet seat or a handicapped toilet? Yes    TIMED UP AND GO:  Unable to perform   Cognitive Function:        Screening Tests Health Maintenance  Topic Date Due  . OPHTHALMOLOGY EXAM  12/08/1960  . TETANUS/TDAP  07/13/2020 (Originally 12/08/1969)  . Hepatitis C Screening  07/13/2020 (Originally 07-04-1951)  . HEMOGLOBIN A1C  11/07/2019  . FOOT EXAM  07/07/2020  . COLONOSCOPY  12/15/2025  . INFLUENZA VACCINE  Completed  . PNA vac Low Risk Adult  Completed    Qualifies for Shingles Vaccine? Yes  Zostavax completed n/a. Due for Shingrix. Education has been provided regarding the importance of this vaccine. Pt has been advised to call insurance company to determine out of pocket expense. Advised may also receive vaccine at local pharmacy or Health Dept. Verbalized acceptance and understanding.  Tdap: Discussed need for TD/TDAP vaccine, patient verbalized understanding that this is not covered as a preventative with there insurance and to call the office if he develops any new skin injuries, ie: cuts, scrapes, bug bites, or open wounds.  Flu Vaccine: up to date   Pneumococcal Vaccine: up to date   Covid-19: Completed vaccines   Cancer Screenings:  Colorectal Screening: Completed 2017. Repeat every 10 years  Lung Cancer Screening: (Low Dose CT Chest recommended if Age 58-80 years, 30 pack-year currently smoking OR have quit w/in 15years.) does not qualify.     Additional Screening:  Hepatitis C Screening: does qualify; declined  Vision Screening: Recommended annual ophthalmology exams for early detection of glaucoma and other disorders  of the eye. Is the patient up to date with their annual eye exam?  Yes  Who is the provider or what is the name of the office in which the pt attends annual eye exams? [facility] [place]   Dental Screening: Recommended annual dental exams for proper oral hygiene  Community Resource Referral:  CRR required this visit?  No        Plan:  I have personally reviewed and addressed the Medicare Annual Wellness questionnaire and have noted the following in the patient's chart:  A. Medical and social history B. Use of alcohol, tobacco or illicit drugs  C. Current medications and supplements D. Functional ability and status E.  Nutritional status F.  Physical activity G. Advance directives H. List of other physicians I.  Hospitalizations, surgeries, and ER visits in previous 12 months J.  [place] such as hearing and vision if needed, cognitive and depression L. Referrals and appointments   In addition, I have reviewed and discussed with patient certain preventive protocols, quality metrics, and best practice recommendations. A written personalized care plan for preventive services as well as general preventive health recommendations were provided to patient.   Signed,    Bevelyn Ngo, LPN   [id number]  Nurse Health Advisor    Nurse Notes: none

## 2019-09-17 NOTE — Telephone Encounter (Signed)
Email to Patient's Caregiver  From: Jill Alexanders [facility])  Sent: Wednesday, September 17, 2019 3:49 PM To: '[email or web address]> Subject: SECURE: [place] Food Resources  Good Afternoon [name], Please see attached list of food banks and the meal calendar through [employer].  I have also included some financial resources for your review.  Please let me know if you need anything further,  [place] . Embedded Care Coordination [place]  Care Management ??[email or web address]  ??[phone]

## 2019-09-17 NOTE — Telephone Encounter (Signed)
Closing referral pending any other needs of patient.

## 2019-09-17 NOTE — Patient Instructions (Signed)
Timothy Gibson , Thank you for taking time to come for your Medicare Wellness Visit. I appreciate your ongoing commitment to your health goals. Please review the following plan we discussed and let me know if I can assist you in the future.   Screening recommendations/referrals: Colonoscopy: up to date  Recommended yearly ophthalmology/optometry visit for glaucoma screening and checkup Recommended yearly dental visit for hygiene and checkup  Vaccinations: Influenza vaccine: up to date Pneumococcal vaccine: up to date Tdap vaccine: due now  Shingles vaccine: shingrix eligible    Covid-19:first dose completed   Advanced directives: Advance directive discussed with you today.Please pick up a copy of this information next time you are in the office. Once this is complete please bring a copy in to our office so we can scan it into your chart.  Conditions/risks identified: food assistance, someone will contact you to discuss assistance.   Next appointment: Follow up in one year for your annual wellness visit   Preventive Care 69 Years and Older, Male Preventive care refers to lifestyle choices and visits with your health care provider that can promote health and wellness. What does preventive care include?  A yearly physical exam. This is also called an annual well check.  Dental exams once or twice a year.  Routine eye exams. Ask your health care provider how often you should have your eyes checked.  Personal lifestyle choices, including:  Daily care of your teeth and gums.  Regular physical activity.  Eating a healthy diet.  Avoiding tobacco and drug use.  Limiting alcohol use.  Practicing safe sex.  Taking low doses of aspirin every day.  Taking vitamin and mineral supplements as recommended by your health care provider. What happens during an annual well check? The services and screenings done by your health care provider during your annual well check will depend on your  age, overall health, lifestyle risk factors, and family history of disease. Counseling  Your health care provider may ask you questions about your:  Alcohol use.  Tobacco use.  Drug use.  Emotional well-being.  Home and relationship well-being.  Sexual activity.  Eating habits.  History of falls.  Memory and ability to understand (cognition).  Work and work Statistician. Screening  You may have the following tests or measurements:  Height, weight, and BMI.  Blood pressure.  Lipid and cholesterol levels. These may be checked every 5 years, or more frequently if you are over 18 years old.  Skin check.  Lung cancer screening. You may have this screening every year starting at age 69 if you have a 30-pack-year history of smoking and currently smoke or have quit within the past 15 years.  Fecal occult blood test (FOBT) of the stool. You may have this test every year starting at age 69.  Flexible sigmoidoscopy or colonoscopy. You may have a sigmoidoscopy every 5 years or a colonoscopy every 10 years starting at age 69.  Prostate cancer screening. Recommendations will vary depending on your family history and other risks.  Hepatitis C blood test.  Hepatitis B blood test.  Sexually transmitted disease (STD) testing.  Diabetes screening. This is done by checking your blood sugar (glucose) after you have not eaten for a while (fasting). You may have this done every 1-3 years.  Abdominal aortic aneurysm (AAA) screening. You may need this if you are a current or former smoker.  Osteoporosis. You may be screened starting at age 69 if you are at high risk. Talk with your  health care provider about your test results, treatment options, and if necessary, the need for more tests. Vaccines  Your health care provider may recommend certain vaccines, such as:  Influenza vaccine. This is recommended every year.  Tetanus, diphtheria, and acellular pertussis (Tdap, Td) vaccine. You  may need a Td booster every 10 years.  Zoster vaccine. You may need this after age 69  Pneumococcal 13-valent conjugate (PCV13) vaccine. One dose is recommended after age 65.  Pneumococcal polysaccharide (PPSV23) vaccine. One dose is recommended after age 69 Talk to your health care provider about which screenings and vaccines you need and how often you need them. This information is not intended to replace advice given to you by your health care provider. Make sure you discuss any questions you have with your health care provider. Document Released: 07/23/2015 Document Revised: 03/15/2016 Document Reviewed: 04/27/2015 Elsevier Interactive Patient Education  2017 [place] Prevention in the Home Falls can cause injuries. They can happen to people of all ages. There are many things you can do to make your home safe and to help prevent falls. What can I do on the outside of my home?  Regularly fix the edges of walkways and driveways and fix any cracks.  Remove anything that might make you trip as you walk through a door, such as a raised step or threshold.  Trim any bushes or trees on the path to your home.  Use bright outdoor lighting.  Clear any walking paths of anything that might make someone trip, such as rocks or tools.  Regularly check to see if handrails are loose or broken. Make sure that both sides of any steps have handrails.  Any raised decks and porches should have guardrails on the edges.  Have any leaves, snow, or ice cleared regularly.  Use sand or salt on walking paths during winter.  Clean up any spills in your garage right away. This includes oil or grease spills. What can I do in the bathroom?  Use night lights.  Install grab bars by the toilet and in the tub and shower. Do not use towel bars as grab bars.  Use non-skid mats or decals in the tub or shower.  If you need to sit down in the shower, use a plastic, non-slip stool.  Keep the floor  dry. Clean up any water that spills on the floor as soon as it happens.  Remove soap buildup in the tub or shower regularly.  Attach bath mats securely with double-sided non-slip rug tape.  Do not have throw rugs and other things on the floor that can make you trip. What can I do in the bedroom?  Use night lights.  Make sure that you have a light by your bed that is easy to reach.  Do not use any sheets or blankets that are too big for your bed. They should not hang down onto the floor.  Have a firm chair that has side arms. You can use this for support while you get dressed.  Do not have throw rugs and other things on the floor that can make you trip. What can I do in the kitchen?  Clean up any spills right away.  Avoid walking on wet floors.  Keep items that you use a lot in easy-to-reach places.  If you need to reach something above you, use a strong step stool that has a grab bar.  Keep electrical cords out of the way.  Do not use  floor polish or wax that makes floors slippery. If you must use wax, use non-skid floor wax.  Do not have throw rugs and other things on the floor that can make you trip. What can I do with my stairs?  Do not leave any items on the stairs.  Make sure that there are handrails on both sides of the stairs and use them. Fix handrails that are broken or loose. Make sure that handrails are as long as the stairways.  Check any carpeting to make sure that it is firmly attached to the stairs. Fix any carpet that is loose or worn.  Avoid having throw rugs at the top or bottom of the stairs. If you do have throw rugs, attach them to the floor with carpet tape.  Make sure that you have a light switch at the top of the stairs and the bottom of the stairs. If you do not have them, ask someone to add them for you. What else can I do to help prevent falls?  Wear shoes that:  Do not have high heels.  Have rubber bottoms.  Are comfortable and fit you  well.  Are closed at the toe. Do not wear sandals.  If you use a stepladder:  Make sure that it is fully opened. Do not climb a closed stepladder.  Make sure that both sides of the stepladder are locked into place.  Ask someone to hold it for you, if possible.  Clearly mark and make sure that you can see:  Any grab bars or handrails.  First and last steps.  Where the edge of each step is.  Use tools that help you move around (mobility aids) if they are needed. These include:  Canes.  Walkers.  Scooters.  Crutches.  Turn on the lights when you go into a dark area. Replace any light bulbs as soon as they burn out.  Set up your furniture so you have a clear path. Avoid moving your furniture around.  If any of your floors are uneven, fix them.  If there are any pets around you, be aware of where they are.  Review your medicines with your doctor. Some medicines can make you feel dizzy. This can increase your chance of falling. Ask your doctor what other things that you can do to help prevent falls. This information is not intended to replace advice given to you by your health care provider. Make sure you discuss any questions you have with your health care provider. Document Released: 04/22/2009 Document Revised: 12/02/2015 Document Reviewed: 07/31/2014 Elsevier Interactive Patient Education  2017 [employer].

## 2019-09-18 ENCOUNTER — Encounter: Payer: Medicare Other | Admitting: Physician Assistant

## 2019-09-18 ENCOUNTER — Other Ambulatory Visit: Payer: Self-pay

## 2019-09-18 DIAGNOSIS — E11621 Type 2 diabetes mellitus with foot ulcer: Secondary | ICD-10-CM | POA: Diagnosis not present

## 2019-09-18 DIAGNOSIS — I120 Hypertensive chronic kidney disease with stage 5 chronic kidney disease or end stage renal disease: Secondary | ICD-10-CM | POA: Diagnosis not present

## 2019-09-18 DIAGNOSIS — N186 End stage renal disease: Secondary | ICD-10-CM | POA: Diagnosis not present

## 2019-09-18 DIAGNOSIS — L97522 Non-pressure chronic ulcer of other part of left foot with fat layer exposed: Secondary | ICD-10-CM | POA: Diagnosis not present

## 2019-09-18 DIAGNOSIS — E1122 Type 2 diabetes mellitus with diabetic chronic kidney disease: Secondary | ICD-10-CM | POA: Diagnosis not present

## 2019-09-18 DIAGNOSIS — M1990 Unspecified osteoarthritis, unspecified site: Secondary | ICD-10-CM | POA: Diagnosis not present

## 2019-09-18 NOTE — Progress Notes (Addendum)
TREQUAN, MARSOLEK ([id number]) Visit Report for 09/18/2019 Chief Complaint Document Details Patient Name: Timothy Gibson, Timothy Gibson Date of Service: 09/18/2019 8:45 AM Medical Record Number: [id number] Patient Account Number: [id number] Date of Birth/Sex: Mar 23, 1951 (69 y.o. M) Treating RN: Army Melia Primary Care Provider: Merrie Roof Other Clinician: Referring Provider: Merrie Roof Treating Provider/Extender: Melburn Hake, Dniyah Grant Weeks in Treatment: 9 Information Obtained from: Patient Chief Complaint Left Great Toe Electronic Signature(s) Signed: 09/18/2019 8:53:23 AM By: Worthy Keeler PA-C Entered By: Worthy Keeler on 09/18/2019 08:53:22 JHAMARI, MARKOWICZ ([id number]) -------------------------------------------------------------------------------- Debridement Details Patient Name: Timothy Gibson Date of Service: 09/18/2019 8:45 AM Medical Record Number: [id number] Patient Account Number: [id number] Date of Birth/Sex: 08-Sep-1950 (68 y.o. M) Treating RN: Army Melia Primary Care Provider: Merrie Roof Other Clinician: Referring Provider: Merrie Roof Treating Provider/Extender: Melburn Hake, Reginald Mangels Weeks in Treatment: 9 Debridement Performed for Wound #3 Left Toe Great Assessment: Performed By: Physician [name], Milen Lengacher E., PA-C Debridement Type: Debridement Severity of Tissue Pre Debridement: Fat layer exposed Level of Consciousness (Pre- Awake and Alert procedure): Pre-procedure Verification/Time Out Yes - 09:16 Taken: Start Time: 09:17 Pain Control: Lidocaine Total Area Debrided (L x W): 1.6 (cm) x 2.4 (cm) = 3.84 (cm) Tissue and other material debrided: Viable, Non-Viable, Slough, Subcutaneous, Slough Level: Skin/Subcutaneous Tissue Debridement Description: Excisional Instrument: Curette Bleeding: Minimum Hemostasis Achieved: Pressure End Time: 09:18 Response to Treatment: Procedure was tolerated well Level of Consciousness (Post- Awake and Alert procedure): Post  Debridement Measurements of Total Wound Length: (cm) 1.6 Width: (cm) 2.4 Depth: (cm) 0.4 Volume: (cm) 1.206 Character of Wound/Ulcer Post Debridement: Stable Severity of Tissue Post Debridement: Fat layer exposed Post Procedure Diagnosis Same as Pre-procedure Electronic Signature(s) Signed: 09/18/2019 3:43:50 PM By: Army Melia Signed: 09/19/2019 4:19:26 PM By: Worthy Keeler PA-C Entered By: Army Melia on 09/18/2019 09:18:33 Timothy Gibson ([id number]) -------------------------------------------------------------------------------- HPI Details Patient Name: Timothy Gibson Date of Service: 09/18/2019 8:45 AM Medical Record Number: [id number] Patient Account Number: [id number] Date of Birth/Sex: May 21, 1951 (69 y.o. M) Treating RN: Army Melia Primary Care Provider: Merrie Roof Other Clinician: Referring Provider: Merrie Roof Treating Provider/Extender: Melburn Hake, Ryllie Nieland Weeks in Treatment: 9 History of Present Illness HPI Description: 11/27/17 on evaluation today patient presents for initial evaluation concerning an issue that he has been having with his right great toe which began last Thursday 11/22/17. He has a history of diabetes mellitus type to which he has had for greater than 30 years currently he is on insulin. Subsequently he also has a history of hypertension. On physical exam inspection is also appears he may have some peripheral vascular disease with his ABI's being noncompressible bilaterally. He is on dialysis as well and this is on Monday, Wednesday, and Friday. Patient was hospitalized back in January/February 2019 although this was more for stomach/back pain though they never told me exactly what was going on. This is according to the patient. Subsequently the ulcer which is between the third and fourth toe when space of the right foot as well is on the plantar aspect of the fourth toe is due to him having cleaned his toes this morning and he states that his  finger which is large split the toe tissue in between causing the wounds that we currently see. With that being said he states this has happened before I explained I would definitely recommend that he not do this any longer. He can use a small soft washcloth to clean between the toes without causing trauma. Subsequently  the right great toe hatchery does show evidence of necrotic tissue present on the surface of the wound specifically there is callous and dead skin surrounding which is trapping fluid causing problems as far as the wound is concerned. The surface of the wound does show slough although due to his vascular flow I'm not going to sharply debride this today I think I will selectively debride away the necrotic skin as well is callous from around the surrounding so this will not continue to be a moisture issue. Nonetheless the patient has no pain he does have diabetic neuropathy. 12/06/17-He is here in follow-up evaluation for a right great toe ulcer. He is voicing no complaints or concerns. He continues to infrequently/socially smokes cigars with no desire to quit. He has been compliant with offloading, using open toed surgical shoe; has been compliant using Santyl daily. He continues on clindamycin although takes it inconsistently secondary to indigestion. The plain film x-ray performed on 5/23 impression: Soft tissue swelling of the left first toe and is noted with probable irregular lucency involving distal tuft of first distal phalanx concerning for osteomyelitis. MRI may be performed for further evaluation; MRI ordered. The vascular evaluation performed on 5/24 field non-compressible ABIs bilaterally and reduced TBI bilaterally suggesting significant tibial disease. He will be referred to vascular medicine for further evaluation. 12/13/17-He is here in follow-up evaluation for right great toe ulcer. He has appointment next Thursday for the MRI and vascular evaluation. Wound culture obtained  last week grew abundant Klebsiella oxytoca (multidrug sensitivity), and abundant enteric coccus faecalis (sensitive to ampicillin). Ampicillin and Cipro were called in, along with a probiotic. In light of his appointments next Thursday he will follow-up in 2 weeks, we will continue with Santyl 12/27/17-He is here for evaluation for a right great toe ulcer. 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 He did have evaluation by vascular medicine, Dr. Ronalee Belts, on 6/13, at that appointment it was decided for him to undergo angiography with possible intervention but he refused and is still considering. If he chooses to have it done he will contact the office. He has not heard back from either ID offices that he was referred to two weeks ago: [place] and [place]. there is improvement in both appearance and measurement to the ulcer. We will extend antibiotic therapy (amoxicillin 500 every 12, Cipro 500 daily) for additional 2 weeks pending infectious disease consult, he will continue with Santyl daily. He was reminded to continue with probiotic therapy while on oral antibiotics; he denies any GI disturbance. 01/03/18-He is here in follow-up evaluation for right great toe ulcer. He is decided to not undergo any vascular intervention at this time. He was established with [facility] infectious disease (Dr. Linus Salmons) last week initiated on vancomycin and cefazolin with dialysis treatment for 6 weeks. We will continue with current treatment plan with Santyl and offloading and he will follow-up in 2 weeks 01/17/18-He is seen in follow-up evaluation for right great toe ulcer. There is improvement in appearance with less nonviable tissue present. We will continue with same treatment plan he will follow-up next week. He is tolerating IV antibiotics without any complications 07/11/56-NI is seen in follow up evaluation for right great toe ulcer.  There continues to be improvement. He is tolerating IV antibiotics with hemodialysis, completion date of 7/31. We will transition to collagen and and follow-up next week 02/07/18-He is here in follow up for a right great  toe ulcer. There is red granulation tissue throughout the wound, improved in appearance. He completed antibiotics yesterday. He saw podiatry last Thursday for nail trimming, at that appointment he periwound callus was trimmed and a culture was obtained; culture negative. He does not have a follow up appointment with infectious disease. We will submit for grafix. 02/14/18-He is here in follow-up evaluation for a right great toe ulcer. There is slow improvement, red granulation tissue throughout. The insurance approval for grafix is pending. We will continue with collagen and he'll follow-up next week 02/21/18-He is seen in follow-up evaluation for right great toe ulcer. There is improvement with red granulation tissue throughout. He was approved for oasis and this was placed today. He will follow up next week 02/28/18-He is seen in follow-up evaluation for right great toe ulcer. The wound is stable, oasis applied and he'll follow-up next week 03/07/18-He is seen in follow-up violation of her right great toe ulcer. The wound is dry, no evidence of drainage. The wound appears healed today. He was painted with Betadine, instructed to paint with Betadine daily, cover with band-aid for the next week and then cover with band-aid for the following week continue with surgical shoe. He was encouraged to contact the clinic with any evidence of drainage, or change in appearance to toe/wound. He will be discharged from wound care services Readmission: 07/14/18 on evaluation today patient presents for initial evaluation our clinic concerning issues that he is having with his left great toe. We previously saw him in 2019 for his right great toe which was actually worse at that time and subsequently was able  to be completely healed in the end although it did take several months. With that being said the patient is currently having an area on his left great toe that occurred initially as result of a blister that came up he's not really sure why or how. Subsequently this was initially treated by his podiatrist though the recommendation there was apparently amputation according to the patient and his daughter who was present with him today. With that being said the patient did not want to proceed with amputation and would like to try to perform wound care and get this to heal without having to go down that road. We were able to do that with his other [place]. ([id number]) toe and so he would like to at least give that a try before going forward with amputation. Again I completely understand his concern in this regard. With that being said I did discuss with the patient obviously that it's always a chance that we cannot get this to heal with normal wound care measures but obviously we will give it our best shot. He does actually have an appointment scheduled for later today with the vascular specialist in order to evaluate his blood flow. That was at the recommendation of his podiatrist as well. Obviously if he is having good arterial flow and nonetheless is still experiencing issues with having the wound delay in healing then it may simply be a matter of we need to initiate more aggressive wound to therapy in order to see if we can get this to heal. No fevers, chills, nausea, or vomiting noted at this time. The patient notes that this woman has been present for approximately three weeks. He is not a smoker. His most recent hemoglobin A1c was 6.2 on 06/18/19. 07/17/2019 on evaluation today patient appears unfortunately to not be doing as well. He did see vascular I did  review the note as well. That was on 07/15/2019. Subsequently there can actually be taking him to the OR for an angiogram due to poor  arterial flow into this extremity. Apparently the atherosclerotic changes were severe. The patient is stated to be at risk for limb loss. Nonetheless the good news is this is being scheduled fairly quickly he will have the surgery/procedure next Thursday. With that being said I am going also go ahead based on the results of his x-ray which showed some chance of osteomyelitis in the distal portion of the toe get the patient set up for an MRI to further evaluate for the possibility of osteomyelitis obviously if he does have osteomyelitis we need to know so that we can address this appropriately. 07/29/2019 upon evaluation today patient appears to be doing better in general with regard to his toe ulcer. He does have much better blood flow you can actually feel a palpable pulse which appears to be very strong at this time. I did review his arterial intervention/angiogram which did show that he had evidence of sufficient arterial flow restoration at this point. He did have occlusions that were 60 and 80% that residually were only 10 and 25% with no limiting flow. This is excellent news and hopefully he will continue to show signs of improvement in light of the improved vascular status. With that being said the toe mainly shows somewhat of an eschar today I think that this is fairly stable and as such probably would be best not to really do much in the way of debridement at this point I do believe Betadine would likely be a good option. 08/07/2019 on evaluation today patient actually appears to be doing about the same. The one difference is the eschar that we were just seeing if we can maintain is actually starting to loosen up and leak around the edges I think it is time to actually go ahead and remove this today. Is also no longer stable is very soft. Nonetheless the patient is okay with proceeding with debridement at this point. Fortunately there is no signs of active infection. No fevers, chills, nausea,  vomiting, or diarrhea. The patient states that he took his last antibiotic that he had last night. He would need a refill as he will not be seeing infectious disease until February 2. Nonetheless obviously I do not want him to have any break in therapy especially when he is doing as well as he is currently. 08/14/2019 on evaluation today patient appears to be doing quite well all things considered in regard to his toe. I do feel like he is making progress and though this is not completely healed by any means we still have some ways to go I feel like he is making progress which is excellent. There is no sign of active infection at this time systemically. He does have osteomyelitis of the distal phalanx of his great toe on the left. He did see Dr. Linus Salmons. That is the infectious disease doctor in [place] where he was referred. Dr. Linus Salmons apparently according to the note did not feel like the patient's MRI revealed osteomyelitis but just reactive marrow and subsequently did not recommend any antibiotic therapy whatsoever. To be peripherally honest I am not really in agreement with this based on the MRI results and what we are seeing and I think he has been responding at least decently well to oral medication as well. Nonetheless we may want to check into the possibility of IV antibiotics  being administered at the dialysis center. I will discuss this with Dr. Dellia Nims further. 08/21/2019 upon evaluation today patient appears at this point to be making some progress with regard to his wounds. He has been tolerating the dressing changes without complication. Fortunately there is no evidence of active infection at this time. Overall I feel like he is doing well with the oral antibiotics I discussed with Dr. Dellia Nims the possibility of doing IV antibiotic therapy versus continuing with the oral. His opinion was if the patient is doing well with oral to maybe stick with that. Obviously we can initiate additional Cipro  as well just to help ensure that there is nothing worsening here. The Cipro should be beneficial for him as well. That helps cover more gram-negative's were is at the doxycycline is more gram-positive's. 09/02/2019 on evaluation today patient appears to be doing fairly well upon inspection today. With that being said though the patient is continuing to do well he does also continue to develop issues with buildup of necrotic tissue on the distal portion of his toe he does have good arterial flow however. For that reason I am going to suggest that what we may need to do is consider using something such as Santyl to try to keep this free and clear. Also discussed in greater detail HBO therapy with him today please see plan for additional recommendations and results of this discussion. 09/11/2019 upon evaluation today patient appears to be doing well with regard to his toe ulcer. This actually appears to be much better today using the Santyl than it was previous. Fortunately there is no signs of active infection. No fevers, chills, nausea, vomiting, or diarrhea. 09/18/2019 upon evaluation today patient appears to be doing much better in regard to his wound in general. The Santyl was doing a good job at loosening stuff up here for [place] which is excellent news. With that being said he is still taking the oral antibiotic. I sent this in last for him on 09/03/2019. This was the doxycycline. When this runs out he will actually be complete as far as the treatment is concerned. With that being said I am getting continue the Cipro for 1 additional month. Overall his wound seems to be doing quite well. Electronic Signature(s) Signed: 09/18/2019 9:31:38 AM By: Worthy Keeler PA-C Entered By: Worthy Keeler on 09/18/2019 09:31:38 IGNACIO, LOWDER ([id number]) -------------------------------------------------------------------------------- Physical Exam Details Patient Name: Timothy Gibson Date of Service:  09/18/2019 8:45 AM Medical Record Number: [id number] Patient Account Number: [id number] Date of Birth/Sex: 19-Jul-1950 (69 y.o. M) Treating RN: Army Melia Primary Care Provider: Merrie Roof Other Clinician: Referring Provider: Merrie Roof Treating Provider/Extender: [name], Farris Geiman Weeks in Treatment: 9 Constitutional Well-nourished and well-hydrated in no acute distress. Respiratory normal breathing without difficulty. Psychiatric this patient is able to make decisions and demonstrates good insight into disease process. Alert and Oriented x 3. pleasant and cooperative. Notes Patient's wound bed currently showed signs of good granulation at this time. Fortunately there is no evidence of active infection which is also good news. I did perform some sharp debridement today to clear away necrotic tissue from the surface of the wound he tolerated this without complication post debridement wound bed appears to be doing much better. Overall I think he is making good progress and is showing signs of healing. Electronic Signature(s) Signed: 09/18/2019 9:32:06 AM By: Worthy Keeler PA-C Entered By: Worthy Keeler on 09/18/2019 09:32:05 Timothy Gibson ([id number]) -------------------------------------------------------------------------------- Physician Orders  Details Patient Name: LOWRY, BALA Date of Service: 09/18/2019 8:45 AM Medical Record Number: [id number] Patient Account Number: [id number] Date of Birth/Sex: Jan 29, 1951 (69 y.o. M) Treating RN: Army Melia Primary Care Provider: Merrie Roof Other Clinician: Referring Provider: Merrie Roof Treating Provider/Extender: Melburn Hake, Tonnia Bardin Weeks in Treatment: 9 Verbal / Phone Orders: No Diagnosis Coding ICD-10 Coding Code Description [phone] Other chronic osteomyelitis, left ankle and foot E11.621 Type 2 diabetes mellitus with foot ulcer L97.522 Non-pressure chronic ulcer of other part of left foot with fat layer exposed I10  Essential (primary) hypertension Wound Cleansing Wound #3 Left Toe Great o Dial antibacterial soap, wash wounds, rinse and pat dry prior to dressing wounds o May Shower, gently pat wound dry prior to applying new dressing. Anesthetic (add to Medication List) Wound #3 Left Toe Great o Topical Lidocaine 4% cream applied to wound bed prior to debridement (In Clinic Only). Primary Wound Dressing Wound #3 Left Toe Great o Santyl Ointment Secondary Dressing Wound #3 Left Toe Great o Dry Gauze o Conform/Kerlix o Foam Dressing Change Frequency Wound #3 Left Toe Great o Change dressing every day. Follow-up Appointments Wound #3 Left Toe Great o Return Appointment in 1 week. Patient Medications Allergies: No Known Drug Allergies Notifications Medication Indication Start End Cipro 09/18/2019 DOSE 1 - oral 500 mg tablet - 1 tablet oral taken 1 time a day for 30 days. on Dialysis days take after dialysis Electronic Signature(s) Signed: 09/18/2019 9:34:45 AM By: Worthy Keeler PA-C Entered By: Worthy Keeler on 09/18/2019 09:34:44 LANGSTON, [place] ([id number]) LAFE, CLERK ([id number]) -------------------------------------------------------------------------------- Problem List Details Patient Name: Timothy Gibson Date of Service: 09/18/2019 8:45 AM Medical Record Number: [id number] Patient Account Number: [id number] Date of Birth/Sex: Jan 18, 1951 (68 y.o. M) Treating RN: Army Melia Primary Care Provider: Merrie Roof Other Clinician: Referring Provider: Merrie Roof Treating Provider/Extender: Melburn Hake, Zakhari Fogel Weeks in Treatment: 9 Active Problems ICD-10 Evaluated Encounter Code Description Active Date Today Diagnosis [phone] Other chronic osteomyelitis, left ankle and foot 07/29/2019 No Yes E11.621 Type 2 diabetes mellitus with foot ulcer 07/15/2019 No Yes L97.522 Non-pressure chronic ulcer of other part of left foot with fat layer exposed 07/15/2019 No  Yes I10 Essential (primary) hypertension 07/15/2019 No Yes Inactive Problems Resolved Problems Electronic Signature(s) Signed: 09/18/2019 8:53:16 AM By: Worthy Keeler PA-C Entered By: Worthy Keeler on 09/18/2019 08:53:16 Timothy Gibson ([id number]) -------------------------------------------------------------------------------- Progress Note Details Patient Name: Timothy Gibson Date of Service: 09/18/2019 8:45 AM Medical Record Number: [id number] Patient Account Number: [id number] Date of Birth/Sex: 1950/08/23 (68 y.o. M) Treating RN: Army Melia Primary Care Provider: Merrie Roof Other Clinician: Referring Provider: Merrie Roof Treating Provider/Extender: Melburn Hake, Jacquese Cassarino Weeks in Treatment: 9 Subjective Chief Complaint Information obtained from Patient Left Great Toe History of Present Illness (HPI) 11/27/17 on evaluation today patient presents for initial evaluation concerning an issue that he has been having with his right great toe which began last Thursday 11/22/17. He has a history of diabetes mellitus type to which he has had for greater than 30 years currently he is on insulin. Subsequently he also has a history of hypertension. On physical exam inspection is also appears he may have some peripheral vascular disease with his ABI's being noncompressible bilaterally. He is on dialysis as well and this is on Monday, Wednesday, and Friday. Patient was hospitalized back in January/February 2019 although this was more for stomach/back pain though they never told me exactly what was going on. This is according to  the patient. Subsequently the ulcer which is between the third and fourth toe when space of the right foot as well is on the plantar aspect of the fourth toe is due to him having cleaned his toes this morning and he states that his finger which is large split the toe tissue in between causing the wounds that we currently see. With that being said he states this has  happened before I explained I would definitely recommend that he not do this any longer. He can use a small soft washcloth to clean between the toes without causing trauma. Subsequently the right great toe hatchery does show evidence of necrotic tissue present on the surface of the wound specifically there is callous and dead skin surrounding which is trapping fluid causing problems as far as the wound is concerned. The surface of the wound does show slough although due to his vascular flow I'm not going to sharply debride this today I think I will selectively debride away the necrotic skin as well is callous from around the surrounding so this will not continue to be a moisture issue. Nonetheless the patient has no pain he does have diabetic neuropathy. 12/06/17-He is here in follow-up evaluation for a right great toe ulcer. He is voicing no complaints or concerns. He continues to infrequently/socially smokes cigars with no desire to quit. He has been compliant with offloading, using open toed surgical shoe; has been compliant using Santyl daily. He continues on clindamycin although takes it inconsistently secondary to indigestion. The plain film x-ray performed on 5/23 impression: Soft tissue swelling of the left first toe and is noted with probable irregular lucency involving distal tuft of first distal phalanx concerning for osteomyelitis. MRI may be performed for further evaluation; MRI ordered. The vascular evaluation performed on 5/24 field non-compressible ABIs bilaterally and reduced TBI bilaterally suggesting significant tibial disease. He will be referred to vascular medicine for further evaluation. 12/13/17-He is here in follow-up evaluation for right great toe ulcer. He has appointment next Thursday for the MRI and vascular evaluation. Wound culture obtained last week grew abundant Klebsiella oxytoca (multidrug sensitivity), and abundant enteric coccus faecalis (sensitive to  ampicillin). Ampicillin and Cipro were called in, along with a probiotic. In light of his appointments next Thursday he will follow-up in 2 weeks, we will continue with Santyl 12/27/17-He is here for evaluation for a right great toe ulcer. 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 He did have evaluation by vascular medicine, Dr. Ronalee Belts, on 6/13, at that appointment it was decided for him to undergo angiography with possible intervention but he refused and is still considering. If he chooses to have it done he will contact the office. He has not heard back from either ID offices that he was referred to two weeks ago: [place] and [place]. there is improvement in both appearance and measurement to the ulcer. We will extend antibiotic therapy (amoxicillin 500 every 12, Cipro 500 daily) for additional 2 weeks pending infectious disease consult, he will continue with Santyl daily. He was reminded to continue with probiotic therapy while on oral antibiotics; he denies any GI disturbance. 01/03/18-He is here in follow-up evaluation for right great toe ulcer. He is decided to not undergo any vascular intervention at this time. He was established with [facility] infectious disease (Dr. Linus Salmons) last week initiated on vancomycin and cefazolin with dialysis treatment for 6 weeks. We will continue with current treatment  plan with Santyl and offloading and he will follow-up in 2 weeks 01/17/18-He is seen in follow-up evaluation for right great toe ulcer. There is improvement in appearance with less nonviable tissue present. We will continue with same treatment plan he will follow-up next week. He is tolerating IV antibiotics without any complications 6/31/49-FW is seen in follow up evaluation for right great toe ulcer. There continues to be improvement. He is tolerating IV antibiotics with hemodialysis, completion date of 7/31. We will  transition to collagen and and follow-up next week 02/07/18-He is here in follow up for a right great toe ulcer. There is red granulation tissue throughout the wound, improved in appearance. He completed antibiotics yesterday. He saw podiatry last Thursday for nail trimming, at that appointment he periwound callus was trimmed and a culture was obtained; culture negative. He does not have a follow up appointment with infectious disease. We will submit for grafix. 02/14/18-He is here in follow-up evaluation for a right great toe ulcer. There is slow improvement, red granulation tissue throughout. The insurance approval for grafix is pending. We will continue with collagen and he'll follow-up next week 02/21/18-He is seen in follow-up evaluation for right great toe ulcer. There is improvement with red granulation tissue throughout. He was approved for oasis and this was placed today. He will follow up next week 02/28/18-He is seen in follow-up evaluation for right great toe ulcer. The wound is stable, oasis applied and he'll follow-up next week 03/07/18-He is seen in follow-up violation of her right great toe ulcer. The wound is dry, no evidence of drainage. The wound appears healed today. He was painted with Betadine, instructed to paint with Betadine daily, cover with band-aid for the next week and then cover with band-aid for the following week continue with surgical shoe. He was encouraged to contact the clinic with any evidence of drainage, or change in appearance to toe/wound. He will be discharged from wound care services Readmission: 07/14/18 on evaluation today patient presents for initial evaluation our clinic concerning issues that he is having with his left great toe. We previously saw GLENNIS, MONTENEGRO ([id number]) him in 2019 for his right great toe which was actually worse at that time and subsequently was able to be completely healed in the end although it did take several months. With that  being said the patient is currently having an area on his left great toe that occurred initially as result of a blister that came up he's not really sure why or how. Subsequently this was initially treated by his podiatrist though the recommendation there was apparently amputation according to the patient and his daughter who was present with him today. With that being said the patient did not want to proceed with amputation and would like to try to perform wound care and get this to heal without having to go down that road. We were able to do that with his other toe and so he would like to at least give that a try before going forward with amputation. Again I completely understand his concern in this regard. With that being said I did discuss with the patient obviously that it's always a chance that we cannot get this to heal with normal wound care measures but obviously we will give it our best shot. He does actually have an appointment scheduled for later today with the vascular specialist in order to evaluate his blood flow. That was at the recommendation of his podiatrist as well. Obviously if he  is having good arterial flow and nonetheless is still experiencing issues with having the wound delay in healing then it may simply be a matter of we need to initiate more aggressive wound to therapy in order to see if we can get this to heal. No fevers, chills, nausea, or vomiting noted at this time. The patient notes that this woman has been present for approximately three weeks. He is not a smoker. His most recent hemoglobin A1c was 6.2 on 06/18/19. 07/17/2019 on evaluation today patient appears unfortunately to not be doing as well. He did see vascular I did review the note as well. That was on 07/15/2019. Subsequently there can actually be taking him to the OR for an angiogram due to poor arterial flow into this extremity. Apparently the atherosclerotic changes were severe. The patient is stated to be at  risk for limb loss. Nonetheless the good news is this is being scheduled fairly quickly he will have the surgery/procedure next Thursday. With that being said I am going also go ahead based on the results of his x-ray which showed some chance of osteomyelitis in the distal portion of the toe get the patient set up for an MRI to further evaluate for the possibility of osteomyelitis obviously if he does have osteomyelitis we need to know so that we can address this appropriately. 07/29/2019 upon evaluation today patient appears to be doing better in general with regard to his toe ulcer. He does have much better blood flow you can actually feel a palpable pulse which appears to be very strong at this time. I did review his arterial intervention/angiogram which did show that he had evidence of sufficient arterial flow restoration at this point. He did have occlusions that were 60 and 80% that residually were only 10 and 25% with no limiting flow. This is excellent news and hopefully he will continue to show signs of improvement in light of the improved vascular status. With that being said the toe mainly shows somewhat of an eschar today I think that this is fairly stable and as such probably would be best not to really do much in the way of debridement at this point I do believe Betadine would likely be a good option. 08/07/2019 on evaluation today patient actually appears to be doing about the same. The one difference is the eschar that we were just seeing if we can maintain is actually starting to loosen up and leak around the edges I think it is time to actually go ahead and remove this today. Is also no longer stable is very soft. Nonetheless the patient is okay with proceeding with debridement at this point. Fortunately there is no signs of active infection. No fevers, chills, nausea, vomiting, or diarrhea. The patient states that he took his last antibiotic that he had last night. He would need a  refill as he will not be seeing infectious disease until February 2. Nonetheless obviously I do not want him to have any break in therapy especially when he is doing as well as he is currently. 08/14/2019 on evaluation today patient appears to be doing quite well all things considered in regard to his toe. I do feel like he is making progress and though this is not completely healed by any means we still have some ways to go I feel like he is making progress which is excellent. There is no sign of active infection at this time systemically. He does have osteomyelitis of the distal phalanx of  his great toe on the left. He did see Dr. Linus Salmons. That is the infectious disease doctor in [place] where he was referred. Dr. Linus Salmons apparently according to the note did not feel like the patient's MRI revealed osteomyelitis but just reactive marrow and subsequently did not recommend any antibiotic therapy whatsoever. To be peripherally honest I am not really in agreement with this based on the MRI results and what we are seeing and I think he has been responding at least decently well to oral medication as well. Nonetheless we may want to check into the possibility of IV antibiotics being administered at the dialysis center. I will discuss this with Dr. Dellia Nims further. 08/21/2019 upon evaluation today patient appears at this point to be making some progress with regard to his wounds. He has been tolerating the dressing changes without complication. Fortunately there is no evidence of active infection at this time. Overall I feel like he is doing well with the oral antibiotics I discussed with Dr. Dellia Nims the possibility of doing IV antibiotic therapy versus continuing with the oral. His opinion was if the patient is doing well with oral to maybe stick with that. Obviously we can initiate additional Cipro as well just to help ensure that there is nothing worsening here. The Cipro should be beneficial for him as well.  That helps cover more gram-negative's were is at the doxycycline is more gram-positive's. 09/02/2019 on evaluation today patient appears to be doing fairly well upon inspection today. With that being said though the patient is continuing to do well he does also continue to develop issues with buildup of necrotic tissue on the distal portion of his toe he does have good arterial flow however. For that reason I am going to suggest that what we may need to do is consider using something such as Santyl to try to keep this free and clear. Also discussed in greater detail HBO therapy with him today please see plan for additional recommendations and results of this discussion. 09/11/2019 upon evaluation today patient appears to be doing well with regard to his toe ulcer. This actually appears to be much better today using the Santyl than it was previous. Fortunately there is no signs of active infection. No fevers, chills, nausea, vomiting, or diarrhea. 09/18/2019 upon evaluation today patient appears to be doing much better in regard to his wound in general. The Santyl was doing a good job at loosening stuff up here for [place] which is excellent news. With that being said he is still taking the oral antibiotic. I sent this in last for him on 09/03/2019. This was the doxycycline. When this runs out he will actually be complete as far as the treatment is concerned. With that being said I am getting continue the Cipro for 1 additional month. Overall his wound seems to be doing quite well. Objective KENRICK, PORE ([id number]) Constitutional Well-nourished and well-hydrated in no acute distress. Vitals Time Taken: 8:55 AM, Height: 76 in, Weight: 244 lbs, BMI: 29.7, Temperature: 98.1 F, Pulse: 78 bpm, Respiratory Rate: 16 breaths/min, Blood Pressure: 183/72 mmHg. Respiratory normal breathing without difficulty. Psychiatric this patient is able to make decisions and demonstrates good insight into disease  process. Alert and Oriented x 3. pleasant and cooperative. General Notes: Patient's wound bed currently showed signs of good granulation at this time. Fortunately there is no evidence of active infection which is also good news. I did perform some sharp debridement today to clear away necrotic tissue from the surface  of the wound he tolerated this without complication post debridement wound bed appears to be doing much better. Overall I think he is making good progress and is showing signs of healing. Integumentary (Hair, Skin) Wound #3 status is Open. Original cause of wound was Gradually Appeared. The wound is located on the Left Toe Great. The wound measures 1.6cm length x 2.4cm width x 0.4cm depth; 3.016cm^2 area and 1.206cm^3 volume. There is Fat Layer (Subcutaneous Tissue) Exposed exposed. There is no tunneling or undermining noted. There is a medium amount of serous drainage noted. The wound margin is flat and intact. There is medium (34- 66%) pink granulation within the wound bed. There is a medium (34-66%) amount of necrotic tissue within the wound bed including Adherent Slough. 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 There was a Excisional Skin/Subcutaneous Tissue Debridement with a total area of 3.84 sq cm performed by [name], Zeek Rostron E., PA-C. With the following instrument(s): Curette to remove Viable and Non-Viable tissue/material. Material removed includes Subcutaneous Tissue and Slough and after achieving pain control using Lidocaine. A time out was conducted at 09:16, prior to the start of  the procedure. A Minimum amount of bleeding was controlled with Pressure. The procedure was tolerated well. Post Debridement Measurements: 1.6cm length x 2.4cm width x 0.4cm depth; 1.206cm^3 volume. Character of Wound/Ulcer Post Debridement is stable. Severity of Tissue Post Debridement is: Fat layer exposed. Post procedure Diagnosis Wound #3: Same as Pre-Procedure Plan Wound Cleansing: Wound #3 Left Toe Great: Dial antibacterial soap, wash wounds, rinse and pat dry prior to dressing wounds May Shower, gently pat wound dry prior to applying new dressing. Anesthetic (add to Medication List): Wound #3 Left Toe GreatARYON, NHAM. ([id number]) Topical Lidocaine 4% cream applied to wound bed prior to debridement (In Clinic Only). Primary Wound Dressing: Wound #3 Left Toe Great: Santyl Ointment Secondary Dressing: Wound #3 Left Toe Great: Dry Gauze Conform/Kerlix Foam Dressing Change Frequency: Wound #3 Left Toe Great: Change dressing every day. Follow-up Appointments: Wound #3 Left Toe Great: Return Appointment in 1 week. The following medication(s) was prescribed: Cipro oral 500 mg tablet 1 1 tablet oral taken 1 time a day for 30 days. on Dialysis days take after dialysis starting 09/18/2019 1. My suggestion currently is can be that we going continue with the oral antibiotic therapy he is done very well with this up to this point. I am going to send in a prescription for the Cipro to be extended for an additional 30 days. 2. I am also going to recommend that we go ahead and continue with the Santyl that seems to be doing excellent for him at this point as well. 3. I am also can recommend the patient continue with the postop surgical shoe as I think that is best for him as well. We will see patient back for reevaluation in 1 week here in the clinic. If anything worsens or changes patient will contact our office for additional recommendations. Electronic Signature(s) Signed:  09/18/2019 9:37:27 AM By: Worthy Keeler PA-C Previous Signature: 09/18/2019 9:33:24 AM Version By: Worthy Keeler PA-C Entered By: Worthy Keeler on 09/18/2019 09:37:27  GABRIAN, HOQUE ([id number]) -------------------------------------------------------------------------------- SuperBill Details Patient Name: MACLIN, GUERRETTE Date of Service: 09/18/2019 Medical Record Number: [id number] Patient Account Number: [id number] Date of Birth/Sex: June 06, 1951 (69 y.o. M) Treating RN: Army Melia Primary Care Provider: Merrie Roof Other Clinician: Referring Provider: Merrie Roof Treating Provider/Extender: Melburn Hake, Stesha Neyens Weeks in Treatment: 9 Diagnosis Coding ICD-10 Codes Code Description [phone] Other chronic osteomyelitis, left ankle and foot E11.621 Type 2 diabetes mellitus with foot ulcer L97.522 Non-pressure chronic ulcer of other part of left foot with fat layer exposed I10 Essential (primary) hypertension Facility Procedures CPT4 Code: [id number] Description: 05697 - DEB SUBQ TISSUE 20 SQ CM/< Modifier: Quantity: 1 CPT4 Code: Description: ICD-10 Diagnosis Description L97.522 Non-pressure chronic ulcer of other part of left foot with fat layer exposed Modifier: Quantity: Physician Procedures CPT4 Code: 9480165 Description: 53748 - WC PHYS LEVEL 4 - EST PT Modifier: 25 Quantity: 1 CPT4 Code: Description: ICD-10 Diagnosis Description M86.672 Other chronic osteomyelitis, left ankle and foot E11.621 Type 2 diabetes mellitus with foot ulcer L97.522 Non-pressure chronic ulcer of other part of left foot with fat layer exposed I10 Essential  (primary) hypertension Modifier: Quantity: CPT4 Code: [id number] Description: 11042 - WC PHYS SUBQ TISS 20 SQ CM Modifier: Quantity: 1 CPT4 Code: Description: ICD-10 Diagnosis Description L97.522 Non-pressure chronic ulcer of other part of left foot with fat layer exposed Modifier: Quantity: Electronic Signature(s) Signed: 09/18/2019 9:38:22  AM By: Worthy Keeler PA-C Entered By: Worthy Keeler on 09/18/2019 09:38:21

## 2019-09-18 NOTE — Progress Notes (Addendum)
Timothy Gibson, Timothy Gibson ([id number]) Visit Report for 09/18/2019 Arrival Information Details Patient Name: Timothy Gibson Date of Service: 09/18/2019 8:45 AM Medical Record Number: [id number] Patient Account Number: [id number] Date of Birth/Sex: 1950-12-27 (69 y.o. M) Treating RN: Army Melia Primary Care Deyonna Fitzsimmons: Merrie Roof Other Clinician: Referring Aaiden Depoy: Merrie Roof Treating Giuseppe Duchemin/Extender: Melburn Hake, [name] Weeks in Treatment: 9 Visit Information History Since Last Visit Added or deleted any medications: No Patient Arrived: Ambulatory Any new allergies or adverse reactions: No Arrival Time: 09:01 Had a fall or experienced change in No Accompanied By: caregiver activities of daily living that may affect Transfer Assistance: None risk of falls: Patient Identification Verified: Yes Signs or symptoms of abuse/neglect since last visito No Secondary Verification Process Completed: Yes Hospitalized since last visit: No Implantable device outside of the clinic excluding No cellular tissue based products placed in the center since last visit: Has Dressing in Place as Prescribed: Yes Pain Present Now: No Electronic Signature(s) Signed: 09/18/2019 3:53:04 PM By: Lorine Bears RCP, RRT, CHT Entered By: Lorine Bears on 09/18/2019 09:01:59 Timothy Gibson ([id number]) -------------------------------------------------------------------------------- Encounter Discharge Information Details Patient Name: Timothy Gibson Date of Service: 09/18/2019 8:45 AM Medical Record Number: [id number] Patient Account Number: [id number] Date of Birth/Sex: 03-Nov-1950 (68 y.o. M) Treating RN: Army Melia Primary Care Dazja Houchin: Merrie Roof Other Clinician: Referring Seda Kronberg: Merrie Roof Treating Brendan Gruwell/Extender: Melburn Hake, [name] Weeks in Treatment: 9 Encounter Discharge Information Items Post Procedure Vitals Discharge Condition: Stable Temperature (F):  98.1 Ambulatory Status: Ambulatory Pulse (bpm): 78 Discharge Destination: Home Respiratory Rate (breaths/min): 16 Transportation: Private Auto Blood Pressure (mmHg): 183/72 Accompanied By: family Schedule Follow-up Appointment: Yes Clinical Summary of Care: Electronic Signature(s) Signed: 09/18/2019 3:43:50 PM By: Army Melia Entered By: Army Melia on 09/18/2019 09:19:47 Timothy Gibson ([id number]) -------------------------------------------------------------------------------- Lower Extremity Assessment Details Patient Name: Timothy Gibson Date of Service: 09/18/2019 8:45 AM Medical Record Number: [id number] Patient Account Number: [id number] Date of Birth/Sex: December 18, 1950 (68 y.o. M) Treating RN: Montey Hora Primary Care Betty Daidone: Merrie Roof Other Clinician: Referring Alva Kuenzel: Merrie Roof Treating Kahmya Pinkham/Extender: [name], [name] Weeks in Treatment: 9 Edema Assessment Assessed: [Left: No] [Right: No] Edema: [Left: Ye] [Right: s] Vascular Assessment Pulses: Dorsalis Pedis Palpable: [Left:Yes] Electronic Signature(s) Signed: 09/18/2019 4:05:00 PM By: Montey Hora Entered By: Montey Hora on 09/18/2019 09:10:00 Timothy Gibson ([id number]) -------------------------------------------------------------------------------- Multi Wound Chart Details Patient Name: Timothy Gibson Date of Service: 09/18/2019 8:45 AM Medical Record Number: [id number] Patient Account Number: [id number] Date of Birth/Sex: 03/17/1951 (69 y.o. M) Treating RN: Army Melia Primary Care Jayel Scaduto: Merrie Roof Other Clinician: Referring Ermalee Mealy: Merrie Roof Treating Lynkin Saini/Extender: Melburn Hake, [name] Weeks in Treatment: 9 Vital Signs Height(in): 76 Pulse(bpm): 78 Weight(lbs): 244 Blood Pressure(mmHg): 183/72 Body Mass Index(BMI): 30 Temperature(F): 98.1 Respiratory Rate(breaths/min): 16 Photos: [N/A:N/A] Wound Location: Left Toe Great N/A N/A Wounding Event: Gradually  Appeared N/A N/A Primary Etiology: Diabetic Wound/Ulcer of the Lower N/A N/A Extremity Comorbid History: Lymphedema, Arrhythmia, N/A N/A Hypertension, Type II Diabetes, End Stage Renal Disease, Rheumatoid Arthritis, Osteoarthritis, Confinement Anxiety Date Acquired: 06/19/2019 N/A N/A Weeks of Treatment: 9 N/A N/A Wound Status: Open N/A N/A Measurements L x W x D (cm) 1.6x2.4x0.4 N/A N/A Area (cm) : 3.016 N/A N/A Volume (cm) : 1.206 N/A N/A % Reduction in Area: -11.30% N/A N/A % Reduction in Volume: -345.00% N/A N/A Classification: Grade 2 N/A N/A Exudate Amount: Medium N/A N/A Exudate Type: Serous N/A N/A Exudate Color: amber N/A N/A Wound Margin: Flat and Intact N/A  N/A Granulation Amount: Medium (34-66%) N/A N/A Granulation Quality: Pink N/A N/A Necrotic Amount: Medium (34-66%) N/A N/A Exposed Structures: Fat Layer (Subcutaneous Tissue) N/A N/A Exposed: Yes Fascia: No Tendon: No Muscle: No Joint: No Bone: No Epithelialization: Small (1-33%) N/A N/A Treatment Notes Electronic Signature(s) Timothy Gibson, Timothy Gibson ([id number]) Signed: 09/18/2019 3:43:50 PM By: Army Melia Entered By: Army Melia on 09/18/2019 09:16:47 Timothy Gibson ([id number]) -------------------------------------------------------------------------------- [place] Details Patient Name: Timothy Gibson Date of Service: 09/18/2019 8:45 AM Medical Record Number: [id number] Patient Account Number: [id number] Date of Birth/Sex: Feb 15, 1951 (69 y.o. M) Treating RN: Army Melia Primary Care Deloma Spindle: Merrie Roof Other Clinician: Referring Helen Winterhalter: Merrie Roof Treating Spenser Cong/Extender: Melburn Hake, [name] Weeks in Treatment: 9 Active Inactive Abuse / Safety / Falls / Self Care Management Nursing Diagnoses: Potential for falls Goals: Patient will not experience any injury related to falls Date Initiated: 07/15/2019 Target Resolution Date: 10/18/2019 Goal Status:  Active Interventions: Assess fall risk on admission and as needed Notes: Necrotic Tissue Nursing Diagnoses: Impaired tissue integrity related to necrotic/devitalized tissue Goals: Necrotic/devitalized tissue will be minimized in the wound bed Date Initiated: 07/15/2019 Target Resolution Date: 10/18/2019 Goal Status: Active Interventions: Provide education on necrotic tissue and debridement process Notes: Orientation to the Wound Care Program Nursing Diagnoses: Knowledge deficit related to the wound healing center program Goals: Patient/caregiver will verbalize understanding of the [facility] Date Initiated: 07/15/2019 Target Resolution Date: 10/18/2019 Goal Status: Active Interventions: Provide education on orientation to the wound center Notes: Peripheral Neuropathy Nursing Diagnoses: Knowledge deficit related to disease process and management of peripheral neurovascular dysfunction Timothy Gibson, Timothy Gibson ([id number]) Goals: Patient/caregiver will verbalize understanding of disease process and disease management Date Initiated: 07/15/2019 Target Resolution Date: 10/18/2019 Goal Status: Active Interventions: Assess signs and symptoms of neuropathy upon admission and as needed Provide education on Management of Neuropathy and Related Ulcers Notes: Wound/Skin Impairment Nursing Diagnoses: Impaired tissue integrity Goals: Ulcer/skin breakdown will heal within 14 weeks Date Initiated: 07/15/2019 Target Resolution Date: 10/18/2019 Goal Status: Active Interventions: Assess patient/caregiver ability to obtain necessary supplies Assess patient/caregiver ability to perform ulcer/skin care regimen upon admission and as needed Assess ulceration(s) every visit Notes: Electronic Signature(s) Signed: 09/18/2019 3:43:50 PM By: Army Melia Entered By: Army Melia on 09/18/2019 09:16:38 Timothy Gibson  ([id number]) -------------------------------------------------------------------------------- Pain Assessment Details Patient Name: Timothy Gibson Date of Service: 09/18/2019 8:45 AM Medical Record Number: [id number] Patient Account Number: [id number] Date of Birth/Sex: [birth year] (69 y.o. M) Treating RN: Montey Hora Primary Care Bethan Adamek: Merrie Roof Other Clinician: Referring Ayaan Ringle: Merrie Roof Treating Gerrod Maule/Extender: Melburn Hake, [name] Weeks in Treatment: 9 Active Problems Location of Pain Severity and Description of Pain Patient Has Paino No Site Locations Pain Management and Medication Current Pain Management: Electronic Signature(s) Signed: 09/18/2019 4:05:00 PM By: Montey Hora Entered By: Montey Hora on 09/18/2019 09:03:56 Timothy Gibson ([id number]) -------------------------------------------------------------------------------- Patient/Caregiver Education Details Patient Name: Timothy Gibson Date of Service: 09/18/2019 8:45 AM Medical Record Number: [id number] Patient Account Number: [id number] Date of Birth/Gender: 09-28-1950 (69 y.o. M) Treating RN: Army Melia Primary Care Physician: Merrie Roof Other Clinician: Referring Physician: Merrie Roof Treating Physician/Extender: Sharalyn Ink in Treatment: 9 Education Assessment Education Provided To: Patient Education Topics Provided Wound/Skin Impairment: Handouts: Caring for Your Ulcer Methods: Demonstration, Explain/Verbal Responses: State content correctly Electronic Signature(s) Signed: 09/18/2019 3:43:50 PM By: Army Melia Entered By: Army Melia on 09/18/2019 09:19:10 Timothy Gibson ([id number]) -------------------------------------------------------------------------------- Wound Assessment Details Patient Name: Timothy Gibson. Date of  Service: 09/18/2019 8:45 AM Medical Record Number: [id number] Patient Account Number: [id number] Date of Birth/Sex: 1951-03-21 (69 y.o.  M) Treating RN: Montey Hora Primary Care Eleshia Wooley: Merrie Roof Other Clinician: Referring Aylanie Cubillos: Merrie Roof Treating Teyla Skidgel/Extender: Melburn Hake, [name] Weeks in Treatment: 9 Wound Status Wound Number: 3 Primary Diabetic Wound/Ulcer of the Lower Extremity Etiology: Wound Location: Left Toe Great Wound Open Wounding Event: Gradually Appeared Status: Date Acquired: 06/19/2019 Comorbid Lymphedema, Arrhythmia, Hypertension, Type II Diabetes, Weeks Of Treatment: 9 History: End Stage Renal Disease, Rheumatoid Arthritis, Clustered Wound: No Osteoarthritis, Confinement Anxiety Photos Wound Measurements Length: (cm) 1.6 % Re Width: (cm) 2.4 % Re Depth: (cm) 0.4 Epit Area: (cm) 3.016 Tun Volume: (cm) 1.206 Und duction in Area: -11.3% duction in Volume: -345% helialization: Small (1-33%) neling: No ermining: No Wound Description Classification: Grade 2 Foul Wound Margin: Flat and Intact Slou Exudate Amount: Medium Exudate Type: Serous Exudate Color: amber Odor After Cleansing: No gh/Fibrino Yes Wound Bed Granulation Amount: Medium (34-66%) Exposed Structure Granulation Quality: Pink Fascia Exposed: No Necrotic Amount: Medium (34-66%) Fat Layer (Subcutaneous Tissue) Exposed: Yes Necrotic Quality: Adherent Slough Tendon Exposed: No Muscle Exposed: No Joint Exposed: No Bone Exposed: No Treatment Notes Wound #3 (Left Toe Great) Notes santyl, gauze, foam, conform, offloading shoe Electronic Signature(s) Timothy Gibson, Timothy Gibson ([id number]) Signed: 09/18/2019 4:05:00 PM By: Montey Hora Entered By: Montey Hora on 09/18/2019 09:08:47 Timothy Gibson ([id number]) -------------------------------------------------------------------------------- [place] Details Patient Name: Timothy Gibson Date of Service: 09/18/2019 8:45 AM Medical Record Number: [id number] Patient Account Number: [id number] Date of Birth/Sex: [birth year] (69 y.o. M) Treating RN: Army Melia Primary Care Roselynn Whitacre: Merrie Roof Other Clinician: Referring Eldridge Marcott: Merrie Roof Treating Kindal Ponti/Extender: Melburn Hake, [name] Weeks in Treatment: 9 Vital Signs Time Taken: 08:55 Temperature (F): 98.1 Height (in): 76 Pulse (bpm): 78 Weight (lbs): 244 Respiratory Rate (breaths/min): 16 Body Mass Index (BMI): 29.7 Blood Pressure (mmHg): 183/72 Reference Range: 80 - 120 mg / dl Electronic Signature(s) Signed: 09/18/2019 3:53:04 PM By: Lorine Bears RCP, RRT, CHT Entered By: Lorine Bears on 09/18/2019 09:02:37

## 2019-09-19 DIAGNOSIS — Z23 Encounter for immunization: Secondary | ICD-10-CM | POA: Diagnosis not present

## 2019-09-19 DIAGNOSIS — D509 Iron deficiency anemia, unspecified: Secondary | ICD-10-CM | POA: Diagnosis not present

## 2019-09-19 DIAGNOSIS — N186 End stage renal disease: Secondary | ICD-10-CM | POA: Diagnosis not present

## 2019-09-19 DIAGNOSIS — N2581 Secondary hyperparathyroidism of renal origin: Secondary | ICD-10-CM | POA: Diagnosis not present

## 2019-09-19 DIAGNOSIS — Z992 Dependence on renal dialysis: Secondary | ICD-10-CM | POA: Diagnosis not present

## 2019-09-19 DIAGNOSIS — D631 Anemia in chronic kidney disease: Secondary | ICD-10-CM | POA: Diagnosis not present

## 2019-09-22 DIAGNOSIS — Z992 Dependence on renal dialysis: Secondary | ICD-10-CM | POA: Diagnosis not present

## 2019-09-22 DIAGNOSIS — N2581 Secondary hyperparathyroidism of renal origin: Secondary | ICD-10-CM | POA: Diagnosis not present

## 2019-09-22 DIAGNOSIS — D509 Iron deficiency anemia, unspecified: Secondary | ICD-10-CM | POA: Diagnosis not present

## 2019-09-22 DIAGNOSIS — N186 End stage renal disease: Secondary | ICD-10-CM | POA: Diagnosis not present

## 2019-09-22 DIAGNOSIS — D631 Anemia in chronic kidney disease: Secondary | ICD-10-CM | POA: Diagnosis not present

## 2019-09-24 DIAGNOSIS — N2581 Secondary hyperparathyroidism of renal origin: Secondary | ICD-10-CM | POA: Diagnosis not present

## 2019-09-24 DIAGNOSIS — Z992 Dependence on renal dialysis: Secondary | ICD-10-CM | POA: Diagnosis not present

## 2019-09-24 DIAGNOSIS — D509 Iron deficiency anemia, unspecified: Secondary | ICD-10-CM | POA: Diagnosis not present

## 2019-09-24 DIAGNOSIS — D631 Anemia in chronic kidney disease: Secondary | ICD-10-CM | POA: Diagnosis not present

## 2019-09-24 DIAGNOSIS — N186 End stage renal disease: Secondary | ICD-10-CM | POA: Diagnosis not present

## 2019-09-25 ENCOUNTER — Other Ambulatory Visit: Payer: Self-pay

## 2019-09-25 ENCOUNTER — Encounter: Payer: Medicare Other | Admitting: Physician Assistant

## 2019-09-25 DIAGNOSIS — E1122 Type 2 diabetes mellitus with diabetic chronic kidney disease: Secondary | ICD-10-CM | POA: Diagnosis not present

## 2019-09-25 DIAGNOSIS — N186 End stage renal disease: Secondary | ICD-10-CM | POA: Diagnosis not present

## 2019-09-25 DIAGNOSIS — M1990 Unspecified osteoarthritis, unspecified site: Secondary | ICD-10-CM | POA: Diagnosis not present

## 2019-09-25 DIAGNOSIS — I120 Hypertensive chronic kidney disease with stage 5 chronic kidney disease or end stage renal disease: Secondary | ICD-10-CM | POA: Diagnosis not present

## 2019-09-25 DIAGNOSIS — L97522 Non-pressure chronic ulcer of other part of left foot with fat layer exposed: Secondary | ICD-10-CM | POA: Diagnosis not present

## 2019-09-25 DIAGNOSIS — E11621 Type 2 diabetes mellitus with foot ulcer: Secondary | ICD-10-CM | POA: Diagnosis not present

## 2019-09-25 NOTE — Progress Notes (Addendum)
JAICOB, DIA ([id number]) Visit Report for 09/25/2019 Chief Complaint Document Details Patient Name: Timothy Gibson, Timothy Gibson Date of Service: 09/25/2019 9:00 AM Medical Record Number: [id number] Patient Account Number: [id number] Date of Birth/Sex: [birth year] (69 y.o. M) Treating RN: Cornell Barman Primary Care Provider: Merrie Roof Other Clinician: Referring Provider: Merrie Roof Treating Provider/Extender: Melburn Hake, Natalie Leclaire Weeks in Treatment: 10 Information Obtained from: Patient Chief Complaint Left Great Toe Electronic Signature(s) Signed: 09/25/2019 9:06:05 AM By: Worthy Keeler Timothy Gibson Entered By: Worthy Keeler on 09/25/2019 09:06:05 Timothy Gibson ([id number]) -------------------------------------------------------------------------------- Debridement Details Patient Name: Timothy Gibson Date of Service: 09/25/2019 9:00 AM Medical Record Number: [id number] Patient Account Number: [id number] Date of Birth/Sex: 1950/11/03 (68 y.o. M) Treating RN: Cornell Barman Primary Care Provider: Merrie Roof Other Clinician: Referring Provider: Merrie Roof Treating Provider/Extender: Melburn Hake, Hendryx Ricke Weeks in Treatment: 10 Debridement Performed for Wound #3 Left Toe Great Assessment: Performed By: Physician [name], Benett Swoyer E., Timothy Gibson Debridement Type: Debridement Severity of Tissue Pre Debridement: Fat layer exposed Level of Consciousness (Pre- Awake and Alert procedure): Pre-procedure Verification/Time Out Yes - 09:05 Taken: Start Time: 09:05 Pain Control: Lidocaine Total Area Debrided (L x W): 1.8 (cm) x 1.2 (cm) = 2.16 (cm) Tissue and other material debrided: Viable, Non-Viable, Slough, Subcutaneous, Skin: Dermis , Slough Level: Skin/Subcutaneous Tissue Debridement Description: Excisional Instrument: Curette Bleeding: Minimum Hemostasis Achieved: Pressure End Time: 09:08 Response to Treatment: Procedure was tolerated well Level of Consciousness (Post- Awake and  Alert procedure): Post Debridement Measurements of Total Wound Length: (cm) 1.8 Width: (cm) 1.2 Depth: (cm) 0.4 Volume: (cm) 0.679 Character of Wound/Ulcer Post Debridement: Stable Severity of Tissue Post Debridement: Fat layer exposed Post Procedure Diagnosis Same as Pre-procedure Electronic Signature(s) Signed: 09/25/2019 5:16:18 PM By: Worthy Keeler Timothy Gibson Signed: 09/26/2019 5:21:44 PM By: Gretta Cool, BSN, RN, CWS, Kim RN, BSN Entered By: Gretta Cool, BSN, RN, CWS, Kim on 09/25/2019 09:08:59 Timothy Gibson ([id number]) -------------------------------------------------------------------------------- HPI Details Patient Name: Timothy Gibson Date of Service: 09/25/2019 9:00 AM Medical Record Number: [id number] Patient Account Number: [id number] Date of Birth/Sex: 10/16/1950 (69 y.o. M) Treating RN: Cornell Barman Primary Care Provider: Merrie Roof Other Clinician: Referring Provider: Merrie Roof Treating Provider/Extender: Melburn Hake, Shenoa Hattabaugh Weeks in Treatment: 10 History of Present Illness HPI Description: 11/27/17 on evaluation today patient presents for initial evaluation concerning an issue that he has been having with his right great toe which began last Thursday 11/22/17. He has a history of diabetes mellitus type to which he has had for greater than 30 years currently he is on insulin. Subsequently he also has a history of hypertension. On physical exam inspection is also appears he may have some peripheral vascular disease with his ABI's being noncompressible bilaterally. He is on dialysis as well and this is on Monday, Wednesday, and Friday. Patient was hospitalized back in January/February 2019 although this was more for stomach/back pain though they never told me exactly what was going on. This is according to the patient. Subsequently the ulcer which is between the third and fourth toe when space of the right foot as well is on the plantar aspect of the fourth toe is due to him having  cleaned his toes this morning and he states that his finger which is large split the toe tissue in between causing the wounds that we currently see. With that being said he states this has happened before I explained I would definitely recommend that he not do this any longer. He can use a small  soft washcloth to clean between the toes without causing trauma. Subsequently the right great toe hatchery does show evidence of necrotic tissue present on the surface of the wound specifically there is callous and dead skin surrounding which is trapping fluid causing problems as far as the wound is concerned. The surface of the wound does show slough although due to his vascular flow I'm not going to sharply debride this today I think I will selectively debride away the necrotic skin as well is callous from around the surrounding so this will not continue to be a moisture issue. Nonetheless the patient has no pain he does have diabetic neuropathy. 12/06/17-He is here in follow-up evaluation for a right great toe ulcer. He is voicing no complaints or concerns. He continues to infrequently/socially smokes cigars with no desire to quit. He has been compliant with offloading, using open toed surgical shoe; has been compliant using Santyl daily. He continues on clindamycin although takes it inconsistently secondary to indigestion. The plain film x-ray performed on 5/23 impression: Soft tissue swelling of the left first toe and is noted with probable irregular lucency involving distal tuft of first distal phalanx concerning for osteomyelitis. MRI may be performed for further evaluation; MRI ordered. The vascular evaluation performed on 5/24 field non-compressible ABIs bilaterally and reduced TBI bilaterally suggesting significant tibial disease. He will be referred to vascular medicine for further evaluation. 12/13/17-He is here in follow-up evaluation for right great toe ulcer. He has appointment next Thursday for the  MRI and vascular evaluation. Wound culture obtained last week grew abundant Klebsiella oxytoca (multidrug sensitivity), and abundant enteric coccus faecalis (sensitive to ampicillin). Ampicillin and Cipro were called in, along with a probiotic. In light of his appointments next Thursday he will follow-up in 2 weeks, we will continue with Santyl 12/27/17-He is here for evaluation for a right great toe ulcer. 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 He did have evaluation by vascular medicine, Dr. Ronalee Belts, on 6/13, at that appointment it was decided for him to undergo angiography with possible intervention but he refused and is still considering. If he chooses to have it done he will contact the office. He has not heard back from either ID offices that he was referred to two weeks ago: [place] and [place]. there is improvement in both appearance and measurement to the ulcer. We will extend antibiotic therapy (amoxicillin 500 every 12, Cipro 500 daily) for additional 2 weeks pending infectious disease consult, he will continue with Santyl daily. He was reminded to continue with probiotic therapy while on oral antibiotics; he denies any GI disturbance. 01/03/18-He is here in follow-up evaluation for right great toe ulcer. He is decided to not undergo any vascular intervention at this time. He was established with [facility] infectious disease (Dr. Linus Salmons) last week initiated on vancomycin and cefazolin with dialysis treatment for 6 weeks. We will continue with current treatment plan with Santyl and offloading and he will follow-up in 2 weeks 01/17/18-He is seen in follow-up evaluation for right great toe ulcer. There is improvement in appearance with less nonviable tissue present. We will continue with same treatment plan he will follow-up next week. He is tolerating IV antibiotics without any complications 9/56/21-HY is seen in  follow up evaluation for right great toe ulcer. There continues to be improvement. He is tolerating IV antibiotics with hemodialysis, completion date of 7/31. We will transition to collagen and and follow-up next  week 02/07/18-He is here in follow up for a right great toe ulcer. There is red granulation tissue throughout the wound, improved in appearance. He completed antibiotics yesterday. He saw podiatry last Thursday for nail trimming, at that appointment he periwound callus was trimmed and a culture was obtained; culture negative. He does not have a follow up appointment with infectious disease. We will submit for grafix. 02/14/18-He is here in follow-up evaluation for a right great toe ulcer. There is slow improvement, red granulation tissue throughout. The insurance approval for grafix is pending. We will continue with collagen and he'll follow-up next week 02/21/18-He is seen in follow-up evaluation for right great toe ulcer. There is improvement with red granulation tissue throughout. He was approved for oasis and this was placed today. He will follow up next week 02/28/18-He is seen in follow-up evaluation for right great toe ulcer. The wound is stable, oasis applied and he'll follow-up next week 03/07/18-He is seen in follow-up violation of her right great toe ulcer. The wound is dry, no evidence of drainage. The wound appears healed today. He was painted with Betadine, instructed to paint with Betadine daily, cover with band-aid for the next week and then cover with band-aid for the following week continue with surgical shoe. He was encouraged to contact the clinic with any evidence of drainage, or change in appearance to toe/wound. He will be discharged from wound care services Readmission: 07/14/18 on evaluation today patient presents for initial evaluation our clinic concerning issues that he is having with his left great toe. We previously saw him in 2019 for his right great toe which was  actually worse at that time and subsequently was able to be completely healed in the end although it did take several months. With that being said the patient is currently having an area on his left great toe that occurred initially as result of a blister that came up he's not really sure why or how. Subsequently this was initially treated by his podiatrist though the recommendation there was apparently amputation according to the patient and his daughter who was present with him today. With that being said the patient did not want to proceed with amputation and would like to try to perform wound care and get this to heal without having to go down that road. We were able to do that with his other KAULDER, ZAHNER. ([id number]) toe and so he would like to at least give that a try before going forward with amputation. Again I completely understand his concern in this regard. With that being said I did discuss with the patient obviously that it's always a chance that we cannot get this to heal with normal wound care measures but obviously we will give it our best shot. He does actually have an appointment scheduled for later today with the vascular specialist in order to evaluate his blood flow. That was at the recommendation of his podiatrist as well. Obviously if he is having good arterial flow and nonetheless is still experiencing issues with having the wound delay in healing then it may simply be a matter of we need to initiate more aggressive wound to therapy in order to see if we can get this to heal. No fevers, chills, nausea, or vomiting noted at this time. The patient notes that this woman has been present for approximately three weeks. He is not a smoker. His most recent hemoglobin A1c was 6.2 on 06/18/19. 07/17/2019 on evaluation today patient appears unfortunately to  not be doing as well. He did see vascular I did review the note as well. That was on 07/15/2019. Subsequently there can actually be  taking him to the OR for an angiogram due to poor arterial flow into this extremity. Apparently the atherosclerotic changes were severe. The patient is stated to be at risk for limb loss. Nonetheless the good news is this is being scheduled fairly quickly he will have the surgery/procedure next Thursday. With that being said I am going also go ahead based on the results of his x-ray which showed some chance of osteomyelitis in the distal portion of the toe get the patient set up for an MRI to further evaluate for the possibility of osteomyelitis obviously if he does have osteomyelitis we need to know so that we can address this appropriately. 07/29/2019 upon evaluation today patient appears to be doing better in general with regard to his toe ulcer. He does have much better blood flow you can actually feel a palpable pulse which appears to be very strong at this time. I did review his arterial intervention/angiogram which did show that he had evidence of sufficient arterial flow restoration at this point. He did have occlusions that were 60 and 80% that residually were only 10 and 25% with no limiting flow. This is excellent news and hopefully he will continue to show signs of improvement in light of the improved vascular status. With that being said the toe mainly shows somewhat of an eschar today I think that this is fairly stable and as such probably would be best not to really do much in the way of debridement at this point I do believe Betadine would likely be a good option. 08/07/2019 on evaluation today patient actually appears to be doing about the same. The one difference is the eschar that we were just seeing if we can maintain is actually starting to loosen up and leak around the edges I think it is time to actually go ahead and remove this today. Is also no longer stable is very soft. Nonetheless the patient is okay with proceeding with debridement at this point. Fortunately there is no signs  of active infection. No fevers, chills, nausea, vomiting, or diarrhea. The patient states that he took his last antibiotic that he had last night. He would need a refill as he will not be seeing infectious disease until February 2. Nonetheless obviously I do not want him to have any break in therapy especially when he is doing as well as he is currently. 08/14/2019 on evaluation today patient appears to be doing quite well all things considered in regard to his toe. I do feel like he is making progress and though this is not completely healed by any means we still have some ways to go I feel like he is making progress which is excellent. There is no sign of active infection at this time systemically. He does have osteomyelitis of the distal phalanx of his great toe on the left. He did see Dr. Linus Salmons. That is the infectious disease doctor in [place] where he was referred. Dr. Linus Salmons apparently according to the note did not feel like the patient's MRI revealed osteomyelitis but just reactive marrow and subsequently did not recommend any antibiotic therapy whatsoever. To be peripherally honest I am not really in agreement with this based on the MRI results and what we are seeing and I think he has been responding at least decently well to oral medication as well. Nonetheless  we may want to check into the possibility of IV antibiotics being administered at the dialysis center. I will discuss this with Dr. Dellia Nims further. 08/21/2019 upon evaluation today patient appears at this point to be making some progress with regard to his wounds. He has been tolerating the dressing changes without complication. Fortunately there is no evidence of active infection at this time. Overall I feel like he is doing well with the oral antibiotics I discussed with Dr. Dellia Nims the possibility of doing IV antibiotic therapy versus continuing with the oral. His opinion was if the patient is doing well with oral to maybe stick with  that. Obviously we can initiate additional Cipro as well just to help ensure that there is nothing worsening here. The Cipro should be beneficial for him as well. That helps cover more gram-negative's were is at the doxycycline is more gram-positive's. 09/02/2019 on evaluation today patient appears to be doing fairly well upon inspection today. With that being said though the patient is continuing to do well he does also continue to develop issues with buildup of necrotic tissue on the distal portion of his toe he does have good arterial flow however. For that reason I am going to suggest that what we may need to do is consider using something such as Santyl to try to keep this free and clear. Also discussed in greater detail HBO therapy with him today please see plan for additional recommendations and results of this discussion. 09/11/2019 upon evaluation today patient appears to be doing well with regard to his toe ulcer. This actually appears to be much better today using the Santyl than it was previous. Fortunately there is no signs of active infection. No fevers, chills, nausea, vomiting, or diarrhea. 09/18/2019 upon evaluation today patient appears to be doing much better in regard to his wound in general. The Santyl was doing a good job at loosening stuff up here for [place] which is excellent news. With that being said he is still taking the oral antibiotic. I sent this in last for him on 09/03/2019. This was the doxycycline. When this runs out he will actually be complete as far as the treatment is concerned. With that being said I am getting continue the Cipro for 1 additional month. Overall his wound seems to be doing quite well. 09/25/2019 upon evaluation today patient appears to be doing well with regard to his toe ulcer. This is measuring much better and overall seems to be making good progress. Fortunately there is no signs of active infection at this time. No fevers, chills, nausea, vomiting, or  diarrhea. Electronic Signature(s) Signed: 09/25/2019 9:12:28 AM By: Worthy Keeler Timothy Gibson Entered By: Worthy Keeler on 09/25/2019 09:12:27 Timothy Gibson, Timothy Gibson ([id number]) -------------------------------------------------------------------------------- Physical Exam Details Patient Name: Timothy Gibson Date of Service: 09/25/2019 9:00 AM Medical Record Number: [id number] Patient Account Number: [id number] Date of Birth/Sex: 1950/10/16 (68 y.o. M) Treating RN: Cornell Barman Primary Care Provider: Merrie Roof Other Clinician: Referring Provider: Merrie Roof Treating Provider/Extender: Melburn Hake, Desmin Daleo Weeks in Treatment: 10 Constitutional Well-nourished and well-hydrated in no acute distress. Respiratory normal breathing without difficulty. Psychiatric this patient is able to make decisions and demonstrates good insight into disease process. Alert and Oriented x 3. pleasant and cooperative. Notes Patient's wound did require sharp debridement to remove slough and necrotic debris from the surface of the wound. He tolerated the debridement today without complication and post debridement wound bed appears to be doing much better which is great news.  Overall I am very pleased with the progress at this time. Electronic Signature(s) Signed: 09/25/2019 9:13:00 AM By: Worthy Keeler Timothy Gibson Entered By: Worthy Keeler on 09/25/2019 09:12:59 Timothy Gibson, Timothy Gibson ([id number]) -------------------------------------------------------------------------------- Physician Orders Details Patient Name: Timothy Gibson Date of Service: 09/25/2019 9:00 AM Medical Record Number: [id number] Patient Account Number: [id number] Date of Birth/Sex: 04-15-1951 (68 y.o. M) Treating RN: Cornell Barman Primary Care Provider: Merrie Roof Other Clinician: Referring Provider: Merrie Roof Treating Provider/Extender: Melburn Hake, Rubylee Zamarripa Weeks in Treatment: 10 Verbal / Phone Orders: No Diagnosis Coding ICD-10 Coding Code  Description [phone] Other chronic osteomyelitis, left ankle and foot E11.621 Type 2 diabetes mellitus with foot ulcer L97.522 Non-pressure chronic ulcer of other part of left foot with fat layer exposed I10 Essential (primary) hypertension Wound Cleansing Wound #3 Left Toe Great o Dial antibacterial soap, wash wounds, rinse and pat dry prior to dressing wounds o May Shower, gently pat wound dry prior to applying new dressing. Anesthetic (add to Medication List) Wound #3 Left Toe Great o Topical Lidocaine 4% cream applied to wound bed prior to debridement (In Clinic Only). Primary Wound Dressing Wound #3 Left Toe Great o Santyl Ointment Secondary Dressing Wound #3 Left Toe Great o Dry Gauze o Conform/Kerlix o Foam Dressing Change Frequency Wound #3 Left Toe Great o Change dressing every day. Follow-up Appointments Wound #3 Left Toe Great o Return Appointment in 1 week. Electronic Signature(s) Signed: 09/25/2019 5:16:18 PM By: Worthy Keeler Timothy Gibson Signed: 09/26/2019 5:21:44 PM By: Gretta Cool, BSN, RN, CWS, Kim RN, BSN Entered By: Gretta Cool, BSN, RN, CWS, Kim on 09/25/2019 09:09:30 Timothy Gibson, Timothy Gibson ([id number]) -------------------------------------------------------------------------------- Problem List Details Patient Name: Timothy Gibson Date of Service: 09/25/2019 9:00 AM Medical Record Number: [id number] Patient Account Number: [id number] Date of Birth/Sex: 1950-11-18 (68 y.o. M) Treating RN: Cornell Barman Primary Care Provider: Merrie Roof Other Clinician: Referring Provider: Merrie Roof Treating Provider/Extender: Melburn Hake, Mao Lockner Weeks in Treatment: 10 Active Problems ICD-10 Evaluated Encounter Code Description Active Date Today Diagnosis [phone] Other chronic osteomyelitis, left ankle and foot 07/29/2019 No Yes E11.621 Type 2 diabetes mellitus with foot ulcer 07/15/2019 No Yes L97.522 Non-pressure chronic ulcer of other part of left foot with fat layer exposed  07/15/2019 No Yes I10 Essential (primary) hypertension 07/15/2019 No Yes Inactive Problems Resolved Problems Electronic Signature(s) Signed: 09/25/2019 9:06:00 AM By: Worthy Keeler Timothy Gibson Entered By: Worthy Keeler on 09/25/2019 09:05:59 Timothy Gibson ([id number]) -------------------------------------------------------------------------------- Progress Note Details Patient Name: Timothy Gibson Date of Service: 09/25/2019 9:00 AM Medical Record Number: [id number] Patient Account Number: [id number] Date of Birth/Sex: [birth year] (68 y.o. M) Treating RN: Cornell Barman Primary Care Provider: Merrie Roof Other Clinician: Referring Provider: Merrie Roof Treating Provider/Extender: Melburn Hake, Maebell Lyvers Weeks in Treatment: 10 Subjective Chief Complaint Information obtained from Patient Left Great Toe History of Present Illness (HPI) 11/27/17 on evaluation today patient presents for initial evaluation concerning an issue that he has been having with his right great toe which began last Thursday 11/22/17. He has a history of diabetes mellitus type to which he has had for greater than 30 years currently he is on insulin. Subsequently he also has a history of hypertension. On physical exam inspection is also appears he may have some peripheral vascular disease with his ABI's being noncompressible bilaterally. He is on dialysis as well and this is on Monday, Wednesday, and Friday. Patient was hospitalized back in January/February 2019 although this was more for stomach/back pain though they never told me exactly  what was going on. This is according to the patient. Subsequently the ulcer which is between the third and fourth toe when space of the right foot as well is on the plantar aspect of the fourth toe is due to him having cleaned his toes this morning and he states that his finger which is large split the toe tissue in between causing the wounds that we currently see. With that being said he states  this has happened before I explained I would definitely recommend that he not do this any longer. He can use a small soft washcloth to clean between the toes without causing trauma. Subsequently the right great toe hatchery does show evidence of necrotic tissue present on the surface of the wound specifically there is callous and dead skin surrounding which is trapping fluid causing problems as far as the wound is concerned. The surface of the wound does show slough although due to his vascular flow I'm not going to sharply debride this today I think I will selectively debride away the necrotic skin as well is callous from around the surrounding so this will not continue to be a moisture issue. Nonetheless the patient has no pain he does have diabetic neuropathy. 12/06/17-He is here in follow-up evaluation for a right great toe ulcer. He is voicing no complaints or concerns. He continues to infrequently/socially smokes cigars with no desire to quit. He has been compliant with offloading, using open toed surgical shoe; has been compliant using Santyl daily. He continues on clindamycin although takes it inconsistently secondary to indigestion. The plain film x-ray performed on 5/23 impression: Soft tissue swelling of the left first toe and is noted with probable irregular lucency involving distal tuft of first distal phalanx concerning for osteomyelitis. MRI may be performed for further evaluation; MRI ordered. The vascular evaluation performed on 5/24 field non-compressible ABIs bilaterally and reduced TBI bilaterally suggesting significant tibial disease. He will be referred to vascular medicine for further evaluation. 12/13/17-He is here in follow-up evaluation for right great toe ulcer. He has appointment next Thursday for the MRI and vascular evaluation. Wound culture obtained last week grew abundant Klebsiella oxytoca (multidrug sensitivity), and abundant enteric coccus faecalis (sensitive to  ampicillin). Ampicillin and Cipro were called in, along with a probiotic. In light of his appointments next Thursday he will follow-up in 2 weeks, we will continue with Santyl 12/27/17-He is here for evaluation for a right great toe ulcer. 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 He did have evaluation by vascular medicine, Dr. Ronalee Belts, on 6/13, at that appointment it was decided for him to undergo angiography with possible intervention but he refused and is still considering. If he chooses to have it done he will contact the office. He has not heard back from either ID offices that he was referred to two weeks ago: [place] and [place]. there is improvement in both appearance and measurement to the ulcer. We will extend antibiotic therapy (amoxicillin 500 every 12, Cipro 500 daily) for additional 2 weeks pending infectious disease consult, he will continue with Santyl daily. He was reminded to continue with probiotic therapy while on oral antibiotics; he denies any GI disturbance. 01/03/18-He is here in follow-up evaluation for right great toe ulcer. He is decided to not undergo any vascular intervention at this time. He was established with [facility] infectious disease (Dr. Linus Salmons) last week initiated on vancomycin and cefazolin with dialysis treatment for  6 weeks. We will continue with current treatment plan with Santyl and offloading and he will follow-up in 2 weeks 01/17/18-He is seen in follow-up evaluation for right great toe ulcer. There is improvement in appearance with less nonviable tissue present. We will continue with same treatment plan he will follow-up next week. He is tolerating IV antibiotics without any complications 0/99/83-JA is seen in follow up evaluation for right great toe ulcer. There continues to be improvement. He is tolerating IV antibiotics with hemodialysis, completion date of 7/31. We will  transition to collagen and and follow-up next week 02/07/18-He is here in follow up for a right great toe ulcer. There is red granulation tissue throughout the wound, improved in appearance. He completed antibiotics yesterday. He saw podiatry last Thursday for nail trimming, at that appointment he periwound callus was trimmed and a culture was obtained; culture negative. He does not have a follow up appointment with infectious disease. We will submit for grafix. 02/14/18-He is here in follow-up evaluation for a right great toe ulcer. There is slow improvement, red granulation tissue throughout. The insurance approval for grafix is pending. We will continue with collagen and he'll follow-up next week 02/21/18-He is seen in follow-up evaluation for right great toe ulcer. There is improvement with red granulation tissue throughout. He was approved for oasis and this was placed today. He will follow up next week 02/28/18-He is seen in follow-up evaluation for right great toe ulcer. The wound is stable, oasis applied and he'll follow-up next week 03/07/18-He is seen in follow-up violation of her right great toe ulcer. The wound is dry, no evidence of drainage. The wound appears healed today. He was painted with Betadine, instructed to paint with Betadine daily, cover with band-aid for the next week and then cover with band-aid for the following week continue with surgical shoe. He was encouraged to contact the clinic with any evidence of drainage, or change in appearance to toe/wound. He will be discharged from wound care services Readmission: 07/14/18 on evaluation today patient presents for initial evaluation our clinic concerning issues that he is having with his left great toe. We previously saw Timothy Gibson, Timothy Gibson ([id number]) him in 2019 for his right great toe which was actually worse at that time and subsequently was able to be completely healed in the end although it did take several months. With that  being said the patient is currently having an area on his left great toe that occurred initially as result of a blister that came up he's not really sure why or how. Subsequently this was initially treated by his podiatrist though the recommendation there was apparently amputation according to the patient and his daughter who was present with him today. With that being said the patient did not want to proceed with amputation and would like to try to perform wound care and get this to heal without having to go down that road. We were able to do that with his other toe and so he would like to at least give that a try before going forward with amputation. Again I completely understand his concern in this regard. With that being said I did discuss with the patient obviously that it's always a chance that we cannot get this to heal with normal wound care measures but obviously we will give it our best shot. He does actually have an appointment scheduled for later today with the vascular specialist in order to evaluate his blood flow. That was at the recommendation  of his podiatrist as well. Obviously if he is having good arterial flow and nonetheless is still experiencing issues with having the wound delay in healing then it may simply be a matter of we need to initiate more aggressive wound to therapy in order to see if we can get this to heal. No fevers, chills, nausea, or vomiting noted at this time. The patient notes that this woman has been present for approximately three weeks. He is not a smoker. His most recent hemoglobin A1c was 6.2 on 06/18/19. 07/17/2019 on evaluation today patient appears unfortunately to not be doing as well. He did see vascular I did review the note as well. That was on 07/15/2019. Subsequently there can actually be taking him to the OR for an angiogram due to poor arterial flow into this extremity. Apparently the atherosclerotic changes were severe. The patient is stated to be at  risk for limb loss. Nonetheless the good news is this is being scheduled fairly quickly he will have the surgery/procedure next Thursday. With that being said I am going also go ahead based on the results of his x-ray which showed some chance of osteomyelitis in the distal portion of the toe get the patient set up for an MRI to further evaluate for the possibility of osteomyelitis obviously if he does have osteomyelitis we need to know so that we can address this appropriately. 07/29/2019 upon evaluation today patient appears to be doing better in general with regard to his toe ulcer. He does have much better blood flow you can actually feel a palpable pulse which appears to be very strong at this time. I did review his arterial intervention/angiogram which did show that he had evidence of sufficient arterial flow restoration at this point. He did have occlusions that were 60 and 80% that residually were only 10 and 25% with no limiting flow. This is excellent news and hopefully he will continue to show signs of improvement in light of the improved vascular status. With that being said the toe mainly shows somewhat of an eschar today I think that this is fairly stable and as such probably would be best not to really do much in the way of debridement at this point I do believe Betadine would likely be a good option. 08/07/2019 on evaluation today patient actually appears to be doing about the same. The one difference is the eschar that we were just seeing if we can maintain is actually starting to loosen up and leak around the edges I think it is time to actually go ahead and remove this today. Is also no longer stable is very soft. Nonetheless the patient is okay with proceeding with debridement at this point. Fortunately there is no signs of active infection. No fevers, chills, nausea, vomiting, or diarrhea. The patient states that he took his last antibiotic that he had last night. He would need a  refill as he will not be seeing infectious disease until February 2. Nonetheless obviously I do not want him to have any break in therapy especially when he is doing as well as he is currently. 08/14/2019 on evaluation today patient appears to be doing quite well all things considered in regard to his toe. I do feel like he is making progress and though this is not completely healed by any means we still have some ways to go I feel like he is making progress which is excellent. There is no sign of active infection at this time systemically. He  does have osteomyelitis of the distal phalanx of his great toe on the left. He did see Dr. Linus Salmons. That is the infectious disease doctor in [place] where he was referred. Dr. Linus Salmons apparently according to the note did not feel like the patient's MRI revealed osteomyelitis but just reactive marrow and subsequently did not recommend any antibiotic therapy whatsoever. To be peripherally honest I am not really in agreement with this based on the MRI results and what we are seeing and I think he has been responding at least decently well to oral medication as well. Nonetheless we may want to check into the possibility of IV antibiotics being administered at the dialysis center. I will discuss this with Dr. Dellia Nims further. 08/21/2019 upon evaluation today patient appears at this point to be making some progress with regard to his wounds. He has been tolerating the dressing changes without complication. Fortunately there is no evidence of active infection at this time. Overall I feel like he is doing well with the oral antibiotics I discussed with Dr. Dellia Nims the possibility of doing IV antibiotic therapy versus continuing with the oral. His opinion was if the patient is doing well with oral to maybe stick with that. Obviously we can initiate additional Cipro as well just to help ensure that there is nothing worsening here. The Cipro should be beneficial for him as well.  That helps cover more gram-negative's were is at the doxycycline is more gram-positive's. 09/02/2019 on evaluation today patient appears to be doing fairly well upon inspection today. With that being said though the patient is continuing to do well he does also continue to develop issues with buildup of necrotic tissue on the distal portion of his toe he does have good arterial flow however. For that reason I am going to suggest that what we may need to do is consider using something such as Santyl to try to keep this free and clear. Also discussed in greater detail HBO therapy with him today please see plan for additional recommendations and results of this discussion. 09/11/2019 upon evaluation today patient appears to be doing well with regard to his toe ulcer. This actually appears to be much better today using the Santyl than it was previous. Fortunately there is no signs of active infection. No fevers, chills, nausea, vomiting, or diarrhea. 09/18/2019 upon evaluation today patient appears to be doing much better in regard to his wound in general. The Santyl was doing a good job at loosening stuff up here for [place] which is excellent news. With that being said he is still taking the oral antibiotic. I sent this in last for him on 09/03/2019. This was the doxycycline. When this runs out he will actually be complete as far as the treatment is concerned. With that being said I am getting continue the Cipro for 1 additional month. Overall his wound seems to be doing quite well. 09/25/2019 upon evaluation today patient appears to be doing well with regard to his toe ulcer. This is measuring much better and overall seems to be making good progress. Fortunately there is no signs of active infection at this time. No fevers, chills, nausea, vomiting, or diarrhea. Timothy Gibson, Timothy Gibson ([id number]) Objective Constitutional Well-nourished and well-hydrated in no acute distress. Vitals Time Taken: 8:43 AM, Height: 76  in, Weight: 244 lbs, BMI: 29.7, Temperature: 98.7 F, Pulse: 78 bpm, Respiratory Rate: 16 breaths/min, Blood Pressure: 191/84 mmHg. Respiratory normal breathing without difficulty. Psychiatric this patient is able to make decisions and demonstrates  good insight into disease process. Alert and Oriented x 3. pleasant and cooperative. General Notes: Patient's wound did require sharp debridement to remove slough and necrotic debris from the surface of the wound. He tolerated the debridement today without complication and post debridement wound bed appears to be doing much better which is great news. Overall I am very pleased with the progress at this time. Integumentary (Hair, Skin) Wound #3 status is Open. Original cause of wound was Gradually Appeared. The wound is located on the Left Toe Great. The wound measures 1.8cm length x 1.2cm width x 0.3cm depth; 1.696cm^2 area and 0.509cm^3 volume. There is Fat Layer (Subcutaneous Tissue) Exposed exposed. There is no tunneling or undermining noted. There is a medium amount of serous drainage noted. The wound margin is flat and intact. There is medium (34- 66%) pink granulation within the wound bed. There is a medium (34-66%) amount of necrotic tissue within the wound bed including Adherent Slough. 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 There was a Excisional Skin/Subcutaneous Tissue Debridement with a total area of 2.16 sq cm performed by [name], Timothy Callaham E., Timothy Gibson. With the following instrument(s): Curette to remove Viable and Non-Viable tissue/material. Material  removed includes Subcutaneous Tissue, Slough, and Skin: Dermis after achieving pain control using Lidocaine. No specimens were taken. A time out was conducted at 09:05, prior to the start of the procedure. A Minimum amount of bleeding was controlled with Pressure. The procedure was tolerated well. Post Debridement Measurements: 1.8cm length x 1.2cm width x 0.4cm depth; 0.679cm^3 volume. Character of Wound/Ulcer Post Debridement is stable. Severity of Tissue Post Debridement is: Fat layer exposed. Post procedure Diagnosis Wound #3: Same as Pre-Procedure Plan Wound Cleansing: Wound #3 Left Toe Great: Dial antibacterial soap, wash wounds, rinse and pat dry prior to dressing wounds May Shower, gently pat wound dry prior to applying new dressing. Anesthetic (add to Medication List): Timothy Gibson, Timothy Gibson ([id number]) Wound #3 Left Toe Great: Topical Lidocaine 4% cream applied to wound bed prior to debridement (In Clinic Only). Primary Wound Dressing: Wound #3 Left Toe Great: Santyl Ointment Secondary Dressing: Wound #3 Left Toe Great: Dry Gauze Conform/Kerlix Foam Dressing Change Frequency: Wound #3 Left Toe Great: Change dressing every day. Follow-up Appointments: Wound #3 Left Toe Great: Return Appointment in 1 week. 1. My suggestion at this point is can be that we continue with the [facility] which I think has done extremely well for the patient he is in agreement with the plan. 2. Also can recommend that we continue to cover this with a saline moistened gauze followed by a foam, dry gauze, and roll gauze. This seems to have done extremely well for him. 3. We will continue to use the postop shoe. We will see patient back for reevaluation in 1 week here in the clinic. If anything worsens or changes patient will contact our office for additional recommendations. Electronic Signature(s) Signed: 09/25/2019 9:13:29 AM By: Worthy Keeler Timothy Gibson Entered By: Worthy Keeler on 09/25/2019  09:13:29 Timothy Gibson, Timothy Gibson ([id number]) -------------------------------------------------------------------------------- SuperBill Details Patient Name: Timothy Gibson Date of Service: 09/25/2019 Medical Record Number: [id number] Patient Account Number: [id number] Date of Birth/Sex:  07/02/1951 (70 y.o. M) Treating RN: Cornell Barman Primary Care Provider: Merrie Roof Other Clinician: Referring Provider: Merrie Roof Treating Provider/Extender: Melburn Hake, Shaka Cardin Weeks in Treatment: 10 Diagnosis Coding ICD-10 Codes Code Description [phone] Other chronic osteomyelitis, left ankle and foot E11.621 Type 2 diabetes mellitus with foot ulcer L97.522 Non-pressure chronic ulcer of other part of left foot with fat layer exposed I10 Essential (primary) hypertension Facility Procedures CPT4 Code: [id number] Description: 95093 - DEB SUBQ TISSUE 20 SQ CM/< Modifier: Quantity: 1 CPT4 Code: Description: ICD-10 Diagnosis Description L97.522 Non-pressure chronic ulcer of other part of left foot with fat layer exposed Modifier: Quantity: Physician Procedures CPT4 Code: [id number] Description: 11042 - WC PHYS SUBQ TISS 20 SQ CM Modifier: Quantity: 1 CPT4 Code: Description: ICD-10 Diagnosis Description L97.522 Non-pressure chronic ulcer of other part of left foot with fat layer exposed Modifier: Quantity: Electronic Signature(s) Signed: 09/25/2019 9:13:39 AM By: Worthy Keeler Timothy Gibson Entered By: Worthy Keeler on 09/25/2019 09:13:38

## 2019-09-26 DIAGNOSIS — N186 End stage renal disease: Secondary | ICD-10-CM | POA: Diagnosis not present

## 2019-09-26 DIAGNOSIS — D631 Anemia in chronic kidney disease: Secondary | ICD-10-CM | POA: Diagnosis not present

## 2019-09-26 DIAGNOSIS — D509 Iron deficiency anemia, unspecified: Secondary | ICD-10-CM | POA: Diagnosis not present

## 2019-09-26 DIAGNOSIS — N2581 Secondary hyperparathyroidism of renal origin: Secondary | ICD-10-CM | POA: Diagnosis not present

## 2019-09-26 DIAGNOSIS — Z992 Dependence on renal dialysis: Secondary | ICD-10-CM | POA: Diagnosis not present

## 2019-09-26 NOTE — Progress Notes (Signed)
NYLAN, NEVEL ([id number]) Visit Report for 09/25/2019 Arrival Information Details Patient Name: Timothy Gibson, Timothy Gibson Date of Service: 09/25/2019 9:00 AM Medical Record Number: [id number] Patient Account Number: [id number] Date of Birth/Sex: [birth year] (69 y.o. M) Treating RN: Cornell Barman Primary Care Stanislawa Gaffin: Merrie Roof Other Clinician: Referring Jahni Paul: Merrie Roof Treating Aerilyn Slee/Extender: Melburn Hake, [name] Weeks in Treatment: 10 Visit Information History Since Last Visit Added or deleted any medications: No Patient Arrived: C[place] Any new allergies or adverse reactions: No Arrival Time: 08:44 Had a fall or experienced change in No Accompanied By: self activities of daily living that may affect Transfer Assistance: None risk of falls: Patient Identification Verified: Yes Signs or symptoms of abuse/neglect since last visito No Secondary Verification Process Completed: Yes Hospitalized since last visit: No Implantable device outside of the clinic excluding No cellular tissue based products placed in the center since last visit: Has Dressing in Place as Prescribed: Yes Pain Present Now: No Electronic Signature(s) Signed: 09/25/2019 3:52:07 PM By: Lorine Bears RCP, RRT, CHT Entered By: Lorine Bears on 09/25/2019 08:46:00 Timothy Gibson ([id number]) -------------------------------------------------------------------------------- Encounter Discharge Information Details Patient Name: Timothy Gibson Date of Service: 09/25/2019 9:00 AM Medical Record Number: [id number] Patient Account Number: [id number] Date of Birth/Sex: 08-25-1950 (68 y.o. M) Treating RN: Cornell Barman Primary Care Caren Garske: Merrie Roof Other Clinician: Referring Kaci Dillie: Merrie Roof Treating Vernona Peake/Extender: Melburn Hake, [name] Weeks in Treatment: 10 Encounter Discharge Information Items Post Procedure Vitals Discharge Condition: Stable Temperature (F): 98.7 Ambulatory  Status: Ambulatory Pulse (bpm): 91 Discharge Destination: Home Respiratory Rate (breaths/min): 16 Transportation: Private Auto Blood Pressure (mmHg): 191/84 Accompanied By: self Schedule Follow-up Appointment: Yes Clinical Summary of Care: Electronic Signature(s) Signed: 09/26/2019 5:21:44 PM By: Gretta Cool, BSN, RN, CWS, Kim RN, BSN Entered By: Gretta Cool, BSN, RN, CWS, Kim on 09/25/2019 09:11:01 Timothy Gibson ([id number]) -------------------------------------------------------------------------------- Lower Extremity Assessment Details Patient Name: Timothy Gibson Date of Service: 09/25/2019 9:00 AM Medical Record Number: [id number] Patient Account Number: [id number] Date of Birth/Sex: [birth year] (68 y.o. M) Treating RN: Cornell Barman Primary Care Kelson Queenan: Merrie Roof Other Clinician: Referring Susanne Baumgarner: Merrie Roof Treating Kynleigh Artz/Extender: Melburn Hake, [name] Weeks in Treatment: 10 Edema Assessment Assessed: [Left: No] [Right: No] Edema: [Left: Ye] [Right: s] Vascular Assessment Pulses: Dorsalis Pedis Palpable: [Left:Yes] Electronic Signature(s) Signed: 09/26/2019 5:21:44 PM By: Gretta Cool, BSN, RN, CWS, Kim RN, BSN Entered By: Gretta Cool, BSN, RN, CWS, Kim on 09/25/2019 08:51:05 JI, FELDNER ([id number]) -------------------------------------------------------------------------------- Multi Wound Chart Details Patient Name: Timothy Gibson Date of Service: 09/25/2019 9:00 AM Medical Record Number: [id number] Patient Account Number: [id number] Date of Birth/Sex: 05-Apr-1951 (68 y.o. M) Treating RN: Cornell Barman Primary Care Kailash Hinze: Merrie Roof Other Clinician: Referring Magda Muise: Merrie Roof Treating Fredick Schlosser/Extender: Melburn Hake, [name] Weeks in Treatment: 10 Vital Signs Height(in): 76 Pulse(bpm): 37 Weight(lbs): 244 Blood Pressure(mmHg): 191/84 Body Mass Index(BMI): 30 Temperature(F): 98.7 Respiratory Rate(breaths/min): 16 Photos: [N/A:N/A] Wound Location: Left Toe Great  N/A N/A Wounding Event: Gradually Appeared N/A N/A Primary Etiology: Diabetic Wound/Ulcer of the Lower N/A N/A Extremity Comorbid History: Lymphedema, Arrhythmia, N/A N/A Hypertension, Type II Diabetes, End Stage Renal Disease, Rheumatoid Arthritis, Osteoarthritis, Confinement Anxiety Date Acquired: 06/19/2019 N/A N/A Weeks of Treatment: 10 N/A N/A Wound Status: Open N/A N/A Measurements L x W x D (cm) 1.8x1.2x0.3 N/A N/A Area (cm) : 1.696 N/A N/A Volume (cm) : 0.509 N/A N/A % Reduction in Area: 37.40% N/A N/A % Reduction in Volume: -87.80% N/A N/A Classification: Grade 2 N/A N/A Exudate Amount: Medium N/A N/A  Exudate Type: Serous N/A N/A Exudate Color: amber N/A N/A Wound Margin: Flat and Intact N/A N/A Granulation Amount: Medium (34-66%) N/A N/A Granulation Quality: Pink N/A N/A Necrotic Amount: Medium (34-66%) N/A N/A Exposed Structures: Fat Layer (Subcutaneous Tissue) N/A N/A Exposed: Yes Fascia: No Tendon: No Muscle: No Joint: No Bone: No Epithelialization: Small (1-33%) N/A N/A Treatment Notes Electronic Signature(s) SAJID, RUPPERT ([id number]) Signed: 09/26/2019 5:21:44 PM By: Gretta Cool, BSN, RN, CWS, Kim RN, BSN Entered By: Gretta Cool, BSN, RN, CWS, Kim on 09/25/2019 09:07:18 AMIAS, HUTCHINSON ([id number]) -------------------------------------------------------------------------------- [place] Details Patient Name: Timothy Gibson Date of Service: 09/25/2019 9:00 AM Medical Record Number: [id number] Patient Account Number: [id number] Date of Birth/Sex: [birth year] (68 y.o. M) Treating RN: Cornell Barman Primary Care Harold Moncus: Merrie Roof Other Clinician: Referring Pauline Trainer: Merrie Roof Treating Shawntae Lowy/Extender: Melburn Hake, [name] Weeks in Treatment: 10 Active Inactive Abuse / Safety / Falls / Self Care Management Nursing Diagnoses: Potential for falls Goals: Patient will not experience any injury related to falls Date Initiated:  07/15/2019 Target Resolution Date: 10/18/2019 Goal Status: Active Interventions: Assess fall risk on admission and as needed Notes: Necrotic Tissue Nursing Diagnoses: Impaired tissue integrity related to necrotic/devitalized tissue Goals: Necrotic/devitalized tissue will be minimized in the wound bed Date Initiated: 07/15/2019 Target Resolution Date: 10/18/2019 Goal Status: Active Interventions: Provide education on necrotic tissue and debridement process Notes: Orientation to the Wound Care Program Nursing Diagnoses: Knowledge deficit related to the wound healing center program Goals: Patient/caregiver will verbalize understanding of the [facility] [place] [facility] Date Initiated: 07/15/2019 Target Resolution Date: 10/18/2019 Goal Status: Active Interventions: Provide education on orientation to the wound center Notes: Peripheral Neuropathy Nursing Diagnoses: Knowledge deficit related to disease process and management of peripheral neurovascular dysfunction KALEL, HARTY ([id number]) Goals: Patient/caregiver will verbalize understanding of disease process and disease management Date Initiated: 07/15/2019 Target Resolution Date: 10/18/2019 Goal Status: Active Interventions: Assess signs and symptoms of neuropathy upon admission and as needed Provide education on Management of Neuropathy and Related Ulcers Notes: Wound/Skin Impairment Nursing Diagnoses: Impaired tissue integrity Goals: Ulcer/skin breakdown will heal within 14 weeks Date Initiated: 07/15/2019 Target Resolution Date: 10/18/2019 Goal Status: Active Interventions: Assess patient/caregiver ability to obtain necessary supplies Assess patient/caregiver ability to perform ulcer/skin care regimen upon admission and as needed Assess ulceration(s) every visit Notes: Electronic Signature(s) Signed: 09/26/2019 5:21:44 PM By: Gretta Cool, BSN, RN, CWS, Kim RN, BSN Entered By: Gretta Cool, BSN, RN, CWS, Kim on 09/25/2019  09:07:09 Timothy Gibson ([id number]) -------------------------------------------------------------------------------- Pain Assessment Details Patient Name: Timothy Gibson Date of Service: 09/25/2019 9:00 AM Medical Record Number: [id number] Patient Account Number: [id number] Date of Birth/Sex: [birth year] (68 y.o. M) Treating RN: Cornell Barman Primary Care Shreya [name]: Merrie Roof Other Clinician: Referring Braylea Brancato: Merrie Roof Treating Kurtiss Wence/Extender: Melburn Hake, [name] Weeks in Treatment: 10 Active Problems Location of Pain Severity and Description of Pain Patient Has Paino No Site Locations Pain Management and Medication Current Pain Management: Notes Patient denies pain at this time. Electronic Signature(s) Signed: 09/26/2019 5:21:44 PM By: Gretta Cool, BSN, RN, CWS, Kim RN, BSN Entered By: Gretta Cool, BSN, RN, CWS, Kim on 09/25/2019 08:49:39 Timothy Gibson ([id number]) -------------------------------------------------------------------------------- Patient/Caregiver Education Details Patient Name: Timothy Gibson Date of Service: 09/25/2019 9:00 AM Medical Record Number: [id number] Patient Account Number: [id number] Date of Birth/Gender: August 31, 1950 (68 y.o. M) Treating RN: Cornell Barman Primary Care Physician: Merrie Roof Other Clinician: Referring Physician: Merrie Roof Treating Physician/Extender: Sharalyn Ink in Treatment: 10 Education Assessment Education Provided To: Patient Education  Topics Provided Wound Debridement: Handouts: Wound Debridement Methods: Demonstration, Explain/Verbal Responses: State content correctly Electronic Signature(s) Signed: 09/26/2019 5:21:44 PM By: Gretta Cool, BSN, RN, CWS, Kim RN, BSN Entered By: Gretta Cool, BSN, RN, CWS, Kim on 09/25/2019 09:09:52 Timothy Gibson ([id number]) -------------------------------------------------------------------------------- Wound Assessment Details Patient Name: Timothy Gibson Date of Service: 09/25/2019  9:00 AM Medical Record Number: [id number] Patient Account Number: [id number] Date of Birth/Sex: [birth year] (68 y.o. M) Treating RN: Cornell Barman Primary Care Dona Klemann: Merrie Roof Other Clinician: Referring Fahd Galea: Merrie Roof Treating Juaquin Ludington/Extender: Melburn Hake, [name] Weeks in Treatment: 10 Wound Status Wound Number: 3 Primary Diabetic Wound/Ulcer of the Lower Extremity Etiology: Wound Location: Left Toe Great Wound Open Wounding Event: Gradually Appeared Status: Date Acquired: 06/19/2019 Comorbid Lymphedema, Arrhythmia, Hypertension, Type II Diabetes, Weeks Of Treatment: 10 History: End Stage Renal Disease, Rheumatoid Arthritis, Clustered Wound: No Osteoarthritis, Confinement Anxiety Photos Wound Measurements Length: (cm) 1.8 % Re Width: (cm) 1.2 % Re Depth: (cm) 0.3 Epit Area: (cm) 1.696 Tun Volume: (cm) 0.509 Und duction in Area: 37.4% duction in Volume: -87.8% helialization: Small (1-33%) neling: No ermining: No Wound Description Classification: Grade 2 Foul Wound Margin: Flat and Intact Slou Exudate Amount: Medium Exudate Type: Serous Exudate Color: amber Odor After Cleansing: No gh/Fibrino Yes Wound Bed Granulation Amount: Medium (34-66%) Exposed Structure Granulation Quality: Pink Fascia Exposed: No Necrotic Amount: Medium (34-66%) Fat Layer (Subcutaneous Tissue) Exposed: Yes Necrotic Quality: Adherent Slough Tendon Exposed: No Muscle Exposed: No Joint Exposed: No Bone Exposed: No Treatment Notes Wound #3 (Left Toe Great) Notes santyl, gauze, foam, conform, offloading shoe Electronic Signature(s) SHALON, COUNCILMAN ([id number]) Signed: 09/26/2019 5:21:44 PM By: Gretta Cool, BSN, RN, CWS, Kim RN, BSN Entered By: Gretta Cool, BSN, RN, CWS, Kim on 09/25/2019 08:50:49 LEONA, PRESSLY ([id number]) -------------------------------------------------------------------------------- [place] Details Patient Name: Timothy Gibson Date of Service: 09/25/2019 9:00  AM Medical Record Number: [id number] Patient Account Number: [id number] Date of Birth/Sex: 03-24-1951 (69 y.o. M) Treating RN: Cornell Barman Primary Care Suprina Mand[place]: Merrie Roof Other Clinician: Referring Garv Kuechle: Merrie Roof Treating Lavette Yankovich/Extender: Melburn Hake, [name] Weeks in Treatment: 10 Vital Signs Time Taken: 08:43 Temperature (F): 98.7 Height (in): 76 Pulse (bpm): 78 Weight (lbs): 244 Respiratory Rate (breaths/min): 16 Body Mass Index (BMI): 29.7 Blood Pressure (mmHg): 191/84 Reference Range: 80 - 120 mg / dl Electronic Signature(s) Signed: 09/25/2019 3:52:07 PM By: Lorine Bears RCP, RRT, CHT Entered By: Lorine Bears on 09/25/2019 08:46:46

## 2019-09-29 DIAGNOSIS — N186 End stage renal disease: Secondary | ICD-10-CM | POA: Diagnosis not present

## 2019-09-29 DIAGNOSIS — D509 Iron deficiency anemia, unspecified: Secondary | ICD-10-CM | POA: Diagnosis not present

## 2019-09-29 DIAGNOSIS — D631 Anemia in chronic kidney disease: Secondary | ICD-10-CM | POA: Diagnosis not present

## 2019-09-29 DIAGNOSIS — Z992 Dependence on renal dialysis: Secondary | ICD-10-CM | POA: Diagnosis not present

## 2019-09-29 DIAGNOSIS — N2581 Secondary hyperparathyroidism of renal origin: Secondary | ICD-10-CM | POA: Diagnosis not present

## 2019-10-01 DIAGNOSIS — N2581 Secondary hyperparathyroidism of renal origin: Secondary | ICD-10-CM | POA: Diagnosis not present

## 2019-10-01 DIAGNOSIS — D509 Iron deficiency anemia, unspecified: Secondary | ICD-10-CM | POA: Diagnosis not present

## 2019-10-01 DIAGNOSIS — N186 End stage renal disease: Secondary | ICD-10-CM | POA: Diagnosis not present

## 2019-10-01 DIAGNOSIS — Z992 Dependence on renal dialysis: Secondary | ICD-10-CM | POA: Diagnosis not present

## 2019-10-01 DIAGNOSIS — D631 Anemia in chronic kidney disease: Secondary | ICD-10-CM | POA: Diagnosis not present

## 2019-10-02 ENCOUNTER — Other Ambulatory Visit: Payer: Self-pay

## 2019-10-02 ENCOUNTER — Encounter: Payer: Medicare Other | Admitting: Physician Assistant

## 2019-10-02 DIAGNOSIS — N186 End stage renal disease: Secondary | ICD-10-CM | POA: Diagnosis not present

## 2019-10-02 DIAGNOSIS — E1122 Type 2 diabetes mellitus with diabetic chronic kidney disease: Secondary | ICD-10-CM | POA: Diagnosis not present

## 2019-10-02 DIAGNOSIS — I120 Hypertensive chronic kidney disease with stage 5 chronic kidney disease or end stage renal disease: Secondary | ICD-10-CM | POA: Diagnosis not present

## 2019-10-02 DIAGNOSIS — E11621 Type 2 diabetes mellitus with foot ulcer: Secondary | ICD-10-CM | POA: Diagnosis not present

## 2019-10-02 DIAGNOSIS — L97522 Non-pressure chronic ulcer of other part of left foot with fat layer exposed: Secondary | ICD-10-CM | POA: Diagnosis not present

## 2019-10-02 DIAGNOSIS — M1990 Unspecified osteoarthritis, unspecified site: Secondary | ICD-10-CM | POA: Diagnosis not present

## 2019-10-02 NOTE — Progress Notes (Addendum)
Timothy Gibson ([id number]) Visit Report for 10/02/2019 Chief Complaint Document Details Patient Name: Timothy Gibson, Timothy Gibson Date of Service: 10/02/2019 9:00 AM Medical Record Number: [id number] Patient Account Number: [id number] Date of Birth/Sex: [birth year] (69 y.o. M) Treating RN: Timothy Gibson Primary Care Provider: Merrie Gibson Other Clinician: Referring Provider: Merrie Gibson Treating Provider/Extender: Timothy Gibson: 11 Information Obtained from: Patient Chief Complaint Left Great Toe Electronic Signature(s) Signed: 10/02/2019 8:52:23 AM By: Timothy Keeler PA-C Entered By: Timothy Gibson on 10/02/2019 08:52:23 Timothy Gibson ([id number]) -------------------------------------------------------------------------------- Debridement Details Patient Name: Timothy Gibson Date of Service: 10/02/2019 9:00 AM Medical Record Number: [id number] Patient Account Number: [id number] Date of Birth/Sex: June 27, 1951 (69 y.o. M) Treating RN: Timothy Gibson Primary Care Provider: Merrie Gibson Other Clinician: Referring Provider: Merrie Gibson Treating Provider/Extender: Timothy Gibson: 11 Debridement Performed for Wound #3 Left Toe Great Assessment: Performed By: Physician Timothy III, Aldwin Micalizzi E., PA-C Debridement Type: Debridement Severity of Tissue Pre Debridement: Fat layer exposed Level of Consciousness (Pre- Awake and Alert procedure): Pre-procedure Verification/Time Out Yes - 09:41 Taken: Start Time: 09:42 Pain Control: Lidocaine Total Area Debrided (L x W): 1.2 (cm) x 1.9 (cm) = 2.28 (cm) Tissue and other material debrided: Viable, Non-Viable, Callus, Slough, Subcutaneous, Slough Level: Skin/Subcutaneous Tissue Debridement Description: Excisional Instrument: Curette Bleeding: Minimum Hemostasis Achieved: Pressure End Time: 09:43 Response to Gibson: Procedure was tolerated well Level of Consciousness (Post- Awake and  Alert procedure): Post Debridement Measurements of Total Wound Length: (cm) 1.2 Width: (cm) 1.9 Depth: (cm) 0.3 Volume: (cm) 0.537 Character of Wound/Ulcer Post Debridement: Stable Severity of Tissue Post Debridement: Fat layer exposed Post Procedure Diagnosis Same as Pre-procedure Electronic Signature(s) Signed: 10/02/2019 3:40:54 PM By: Timothy Gibson Signed: 10/02/2019 4:59:35 PM By: Timothy Keeler PA-C Entered By: Timothy Gibson on 10/02/2019 09:42:55 Timothy Gibson ([id number]) -------------------------------------------------------------------------------- HPI Details Patient Name: Timothy Gibson Date of Service: 10/02/2019 9:00 AM Medical Record Number: [id number] Patient Account Number: [id number] Date of Birth/Sex: [birth year] (69 y.o. M) Treating RN: Timothy Gibson Primary Care Provider: Merrie Gibson Other Clinician: Referring Provider: Merrie Gibson Treating Provider/Extender: Timothy Gibson, Timothy Gibson Gibson in Gibson: 11 History of Present Illness HPI Description: 11/27/17 on evaluation today patient presents for initial evaluation concerning an issue that he has been having with his right great toe which began last Thursday 11/22/17. He has a history of diabetes mellitus type to which he has had for greater than 30 years currently he is on insulin. Subsequently he also has a history of hypertension. On physical exam inspection is also appears he may have some peripheral vascular disease with his ABI's being noncompressible bilaterally. He is on dialysis as well and this is on Monday, Wednesday, and Friday. Patient was hospitalized back in January/February 2019 although this was more for stomach/back pain though they never told me exactly what was going on. This is according to the patient. Subsequently the ulcer which is between the third and fourth toe when space of the right foot as well is on the plantar aspect of the fourth toe is due to him having cleaned his toes this morning  and he states that his finger which is large split the toe tissue in between causing the wounds that we currently see. With that being said he states this has happened before I explained I would definitely recommend that he not do this any longer. He can use a small soft washcloth to clean between the toes without causing trauma.  Subsequently the right great toe hatchery does show evidence of necrotic tissue present on the surface of the wound specifically there is callous and dead skin surrounding which is trapping fluid causing problems as far as the wound is concerned. The surface of the wound does show slough although due to his vascular flow I'm not going to sharply debride this today I think I will selectively debride away the necrotic skin as well is callous from around the surrounding so this will not continue to be a moisture issue. Nonetheless the patient has no pain he does have diabetic neuropathy. 12/06/17-He is here in follow-up evaluation for a right great toe ulcer. He is voicing no complaints or concerns. He continues to infrequently/socially smokes cigars with no desire to quit. He has been compliant with offloading, using open toed surgical shoe; has been compliant using Santyl daily. He continues on clindamycin although takes it inconsistently secondary to indigestion. The plain film x-ray performed on 5/23 impression: Soft tissue swelling of the left first toe and is noted with probable irregular lucency involving distal tuft of first distal phalanx concerning for osteomyelitis. MRI may be performed for further evaluation; MRI ordered. The vascular evaluation performed on 5/24 field non-compressible ABIs bilaterally and reduced TBI bilaterally suggesting significant tibial disease. He will be referred to vascular medicine for further evaluation. 12/13/17-He is here in follow-up evaluation for right great toe ulcer. He has appointment next Thursday for the MRI and vascular evaluation.  Wound culture obtained last week grew abundant Klebsiella oxytoca (multidrug sensitivity), and abundant enteric coccus faecalis (sensitive to ampicillin). Ampicillin and Cipro were called in, along with a probiotic. In light of his appointments next Thursday he will follow-up in 2 Gibson, we will continue with Santyl 12/27/17-He is here for evaluation for a right great toe ulcer. 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 He did have evaluation by vascular medicine, Dr. Ronalee Belts, on 6/13, at that appointment it was decided for him to undergo angiography with possible intervention but he refused and is still considering. If he chooses to have it done he will contact the office. He has not heard back from either ID offices that he was referred to two Gibson ago: [place] and [place]. there is improvement in both appearance and measurement to the ulcer. We will extend antibiotic therapy (amoxicillin 500 every 12, Cipro 500 daily) for additional 2 Gibson pending infectious disease consult, he will continue with Santyl daily. He was reminded to continue with probiotic therapy while on oral antibiotics; he denies any GI disturbance. 01/03/18-He is here in follow-up evaluation for right great toe ulcer. He is decided to not undergo any vascular intervention at this time. He was established with [facility] infectious disease (Dr. Linus Salmons) last week initiated on vancomycin and cefazolin with dialysis Gibson for 6 Gibson. We will continue with current Gibson plan with Santyl and offloading and he will follow-up in 2 Gibson 01/17/18-He is seen in follow-up evaluation for right great toe ulcer. There is improvement in appearance with less nonviable tissue present. We will continue with same Gibson plan he will follow-up next week. He is tolerating IV antibiotics without any complications 0/03/70-[facility] is seen in follow up evaluation for  right great toe ulcer. There continues to be improvement. He is tolerating IV antibiotics with hemodialysis, completion date of 7/31. We will transition to collagen and and follow-up next week 02/07/18-He is here in follow up for a right  great toe ulcer. There is red granulation tissue throughout the wound, improved in appearance. He completed antibiotics yesterday. He saw podiatry last Thursday for nail trimming, at that appointment he periwound callus was trimmed and a culture was obtained; culture negative. He does not have a follow up appointment with infectious disease. We will submit for grafix. 02/14/18-He is here in follow-up evaluation for a right great toe ulcer. There is slow improvement, red granulation tissue throughout. The insurance approval for grafix is pending. We will continue with collagen and he'll follow-up next week 02/21/18-He is seen in follow-up evaluation for right great toe ulcer. There is improvement with red granulation tissue throughout. He was approved for oasis and this was placed today. He will follow up next week 02/28/18-He is seen in follow-up evaluation for right great toe ulcer. The wound is stable, oasis applied and he'll follow-up next week 03/07/18-He is seen in follow-up violation of her right great toe ulcer. The wound is dry, no evidence of drainage. The wound appears healed today. He was painted with Betadine, instructed to paint with Betadine daily, cover with band-aid for the next week and then cover with band-aid for the following week continue with surgical shoe. He was encouraged to contact the clinic with any evidence of drainage, or change in appearance to toe/wound. He will be discharged from wound care services Readmission: 07/14/18 on evaluation today patient presents for initial evaluation our clinic concerning issues that he is having with his left great toe. We previously saw him in 2019 for his right great toe which was actually worse at that time  and subsequently was able to be completely healed in the end although it did take several months. With that being said the patient is currently having an area on his left great toe that occurred initially as result of a blister that came up he's not really sure why or how. Subsequently this was initially treated by his podiatrist though the recommendation there was apparently amputation according to the patient and his daughter who was present with him today. With that being said the patient did not want to proceed with amputation and would like to try to perform wound care and get this to heal without having to go down that road. We were able to do that with his other Timothy Gibson, Timothy Gibson. ([id number]) toe and so he would like to at least give that a try before going forward with amputation. Again I completely understand his concern in this regard. With that being said I did discuss with the patient obviously that it's always a chance that we cannot get this to heal with normal wound care measures but obviously we will give it our best shot. He does actually have an appointment scheduled for later today with the vascular specialist in order to evaluate his blood flow. That was at the recommendation of his podiatrist as well. Obviously if he is having good arterial flow and nonetheless is still experiencing issues with having the wound delay in healing then it may simply be a matter of we need to initiate more aggressive wound to therapy in order to see if we can get this to heal. No fevers, chills, nausea, or vomiting noted at this time. The patient notes that this woman has been present for approximately three Gibson. He is not a smoker. His most recent hemoglobin A1c was 6.2 on 06/18/19. 07/17/2019 on evaluation today patient appears unfortunately to not be doing as well. He did see vascular I  did review the note as well. That was on 07/15/2019. Subsequently there can actually be taking him to the OR for an  angiogram due to poor arterial flow into this extremity. Apparently the atherosclerotic changes were severe. The patient is stated to be at risk for limb loss. Nonetheless the good news is this is being scheduled fairly quickly he will have the surgery/procedure next Thursday. With that being said I am going also go ahead based on the results of his x-ray which showed some chance of osteomyelitis in the distal portion of the toe get the patient set up for an MRI to further evaluate for the possibility of osteomyelitis obviously if he does have osteomyelitis we need to know so that we can address this appropriately. 07/29/2019 upon evaluation today patient appears to be doing better in general with regard to his toe ulcer. He does have much better blood flow you can actually feel a palpable pulse which appears to be very strong at this time. I did review his arterial intervention/angiogram which did show that he had evidence of sufficient arterial flow restoration at this point. He did have occlusions that were 60 and 80% that residually were only 10 and 25% with no limiting flow. This is excellent news and hopefully he will continue to show signs of improvement in light of the improved vascular status. With that being said the toe mainly shows somewhat of an eschar today I think that this is fairly stable and as such probably would be best not to really do much in the way of debridement at this point I do believe Betadine would likely be a good option. 08/07/2019 on evaluation today patient actually appears to be doing about the same. The one difference is the eschar that we were just seeing if we can maintain is actually starting to loosen up and leak around the edges I think it is time to actually go ahead and remove this today. Is also no longer stable is very soft. Nonetheless the patient is okay with proceeding with debridement at this point. Fortunately there is no signs of active infection.  No fevers, chills, nausea, vomiting, or diarrhea. The patient states that he took his last antibiotic that he had last night. He would need a refill as he will not be seeing infectious disease until February 2. Nonetheless obviously I do not want him to have any break in therapy especially when he is doing as well as he is currently. 08/14/2019 on evaluation today patient appears to be doing quite well all things considered in regard to his toe. I do feel like he is making progress and though this is not completely healed by any means we still have some ways to go I feel like he is making progress which is excellent. There is no sign of active infection at this time systemically. He does have osteomyelitis of the distal phalanx of his great toe on the left. He did see Dr. Linus Salmons. That is the infectious disease doctor in [place] where he was referred. Dr. Linus Salmons apparently according to the note did not feel like the patient's MRI revealed osteomyelitis but just reactive marrow and subsequently did not recommend any antibiotic therapy whatsoever. To be peripherally honest I am not really in agreement with this based on the MRI results and what we are seeing and I think he has been responding at least decently well to oral medication as well. Nonetheless we may want to check into the possibility of IV  antibiotics being administered at the dialysis center. I will discuss this with Dr. Dellia Nims further. 08/21/2019 upon evaluation today patient appears at this point to be making some progress with regard to his wounds. He has been tolerating the dressing changes without complication. Fortunately there is no evidence of active infection at this time. Overall I feel like he is doing well with the oral antibiotics I discussed with Dr. Dellia Nims the possibility of doing IV antibiotic therapy versus continuing with the oral. His opinion was if the patient is doing well with oral to maybe stick with that. Obviously we  can initiate additional Cipro as well just to help ensure that there is nothing worsening here. The Cipro should be beneficial for him as well. That helps cover more gram-negative's were is at the doxycycline is more gram-positive's. 09/02/2019 on evaluation today patient appears to be doing fairly well upon inspection today. With that being said though the patient is continuing to do well he does also continue to develop issues with buildup of necrotic tissue on the distal portion of his toe he does have good arterial flow however. For that reason I am going to suggest that what we may need to do is consider using something such as Santyl to try to keep this free and clear. Also discussed in greater detail HBO therapy with him today please see plan for additional recommendations and results of this discussion. 09/11/2019 upon evaluation today patient appears to be doing well with regard to his toe ulcer. This actually appears to be much better today using the Santyl than it was previous. Fortunately there is no signs of active infection. No fevers, chills, nausea, vomiting, or diarrhea. 09/18/2019 upon evaluation today patient appears to be doing much better in regard to his wound in general. The Santyl was doing a good job at loosening stuff up here for [place] which is excellent news. With that being said he is still taking the oral antibiotic. I sent this in last for him on 09/03/2019. This was the doxycycline. When this runs out he will actually be complete as far as the Gibson is concerned. With that being said I am getting continue the Cipro for 1 additional month. Overall his wound seems to be doing quite well. 09/25/2019 upon evaluation today patient appears to be doing well with regard to his toe ulcer. This is measuring much better and overall seems to be making good progress. Fortunately there is no signs of active infection at this time. No fevers, chills, nausea, vomiting, or diarrhea. 10/02/2019  upon evaluation today patient appears to be doing well with regard to his wound. He has been tolerating the dressing changes without complication. Fortunately there is no signs of active infection at this time. No fevers, chills, nausea, vomiting, or diarrhea. Overall I am extremely pleased with how things seem to be progressing. I think the wound bed is at the point now where we can likely proceed with additional measures to try to get this healed more quickly I think Dermagraft may be a good possibility for the patient. We can definitely look into seeing about approval for this. Electronic Signature(s) Signed: 10/02/2019 9:50:07 AM By: Timothy Keeler PA-C Entered By: Timothy Gibson on 10/02/2019 09:50:07 MALAHKI, GASAWAY ([id number]) -------------------------------------------------------------------------------- Physical Exam Details Patient Name: Timothy Gibson Date of Service: 10/02/2019 9:00 AM Medical Record Number: [id number] Patient Account Number: [id number] Date of Birth/Sex: [birth year] (68 y.o. M) Treating RN: Timothy Gibson Primary Care Provider: Merrie Gibson Other  Clinician: Referring Provider: Merrie Gibson Treating Provider/Extender: Timothy III, Thurston Brendlinger Gibson in Gibson: 40 Constitutional Well-nourished and well-hydrated in no acute distress. Respiratory normal breathing without difficulty. Psychiatric this patient is able to make decisions and demonstrates good insight into disease process. Alert and Oriented x 3. pleasant and cooperative. Notes Patient's wound bed currently showed signs of good granulation at this time. Fortunately there is no evidence of active infection which is great news. No fevers, chills, nausea, vomiting, or diarrhea. Electronic Signature(s) Signed: 10/02/2019 9:50:22 AM By: Timothy Keeler PA-C Entered By: Timothy Gibson on 10/02/2019 09:50:22 Timothy Gibson, Timothy Gibson  ([id number]) -------------------------------------------------------------------------------- Physician Orders Details Patient Name: Timothy Gibson Date of Service: 10/02/2019 9:00 AM Medical Record Number: [id number] Patient Account Number: [id number] Date of Birth/Sex: July 08, 1951 (68 y.o. M) Treating RN: Timothy Gibson Primary Care Provider: Merrie Gibson Other Clinician: Referring Provider: Merrie Gibson Treating Provider/Extender: Timothy Gibson, Layaan Mott Gibson in Gibson: 107 Verbal / Phone Orders: No Diagnosis Coding ICD-10 Coding Code Description [phone] Other chronic osteomyelitis, left ankle and foot E11.621 Type 2 diabetes mellitus with foot ulcer L97.522 Non-pressure chronic ulcer of other part of left foot with fat layer exposed I10 Essential (primary) hypertension Wound Cleansing Wound #3 Left Toe Great o Dial antibacterial soap, wash wounds, rinse and pat dry prior to dressing wounds o May Shower, gently pat wound dry prior to applying new dressing. Anesthetic (add to Medication List) Wound #3 Left Toe Great o Topical Lidocaine 4% cream applied to wound bed prior to debridement (In Clinic Only). Primary Wound Dressing Wound #3 Left Toe Great o Santyl Ointment Secondary Dressing Wound #3 Left Toe Great o Dry Gauze o Conform/Kerlix o Foam Dressing Change Frequency Wound #3 Left Toe Great o Change dressing every day. Follow-up Appointments Wound #3 Left Toe Great o Return Appointment in 1 week. Electronic Signature(s) Signed: 10/02/2019 3:40:54 PM By: Timothy Gibson Signed: 10/02/2019 4:59:35 PM By: Timothy Keeler PA-C Entered By: Timothy Gibson on 10/02/2019 09:44:16 Timothy Gibson, Timothy Gibson ([id number]) -------------------------------------------------------------------------------- Problem List Details Patient Name: Timothy Gibson Date of Service: 10/02/2019 9:00 AM Medical Record Number: [id number] Patient Account Number: [id number] Date of Birth/Sex:  [birth year] (69 y.o. M) Treating RN: Timothy Gibson Primary Care Provider: Merrie Gibson Other Clinician: Referring Provider: Merrie Gibson Treating Provider/Extender: Timothy Gibson, Jamaris Biernat Gibson in Gibson: 11 Active Problems ICD-10 Evaluated Encounter Code Description Active Date Today Diagnosis [phone] Other chronic osteomyelitis, left ankle and foot 07/29/2019 No Yes E11.621 Type 2 diabetes mellitus with foot ulcer 07/15/2019 No Yes L97.522 Non-pressure chronic ulcer of other part of left foot with fat layer exposed 07/15/2019 No Yes I10 Essential (primary) hypertension 07/15/2019 No Yes Inactive Problems Resolved Problems Electronic Signature(s) Signed: 10/02/2019 8:52:17 AM By: Timothy Keeler PA-C Entered By: Timothy Gibson on 10/02/2019 08:52:16 Timothy Gibson ([id number]) -------------------------------------------------------------------------------- Progress Note Details Patient Name: Timothy Gibson Date of Service: 10/02/2019 9:00 AM Medical Record Number: [id number] Patient Account Number: [id number] Date of Birth/Sex: [birth year] (68 y.o. M) Treating RN: Timothy Gibson Primary Care Provider: Merrie Gibson Other Clinician: Referring Provider: Merrie Gibson Treating Provider/Extender: Timothy Gibson, Zavian Slowey Gibson in Gibson: 11 Subjective Chief Complaint Information obtained from Patient Left Great Toe History of Present Illness (HPI) 11/27/17 on evaluation today patient presents for initial evaluation concerning an issue that he has been having with his right great toe which began last Thursday 11/22/17. He has a history of diabetes mellitus type to which he has had for greater than 30 years currently he is on  insulin. Subsequently he also has a history of hypertension. On physical exam inspection is also appears he may have some peripheral vascular disease with his ABI's being noncompressible bilaterally. He is on dialysis as well and this is on Monday, Wednesday, and Friday. Patient was  hospitalized back in January/February 2019 although this was more for stomach/back pain though they never told me exactly what was going on. This is according to the patient. Subsequently the ulcer which is between the third and fourth toe when space of the right foot as well is on the plantar aspect of the fourth toe is due to him having cleaned his toes this morning and he states that his finger which is large split the toe tissue in between causing the wounds that we currently see. With that being said he states this has happened before I explained I would definitely recommend that he not do this any longer. He can use a small soft washcloth to clean between the toes without causing trauma. Subsequently the right great toe hatchery does show evidence of necrotic tissue present on the surface of the wound specifically there is callous and dead skin surrounding which is trapping fluid causing problems as far as the wound is concerned. The surface of the wound does show slough although due to his vascular flow I'm not going to sharply debride this today I think I will selectively debride away the necrotic skin as well is callous from around the surrounding so this will not continue to be a moisture issue. Nonetheless the patient has no pain he does have diabetic neuropathy. 12/06/17-He is here in follow-up evaluation for a right great toe ulcer. He is voicing no complaints or concerns. He continues to infrequently/socially smokes cigars with no desire to quit. He has been compliant with offloading, using open toed surgical shoe; has been compliant using Santyl daily. He continues on clindamycin although takes it inconsistently secondary to indigestion. The plain film x-ray performed on 5/23 impression: Soft tissue swelling of the left first toe and is noted with probable irregular lucency involving distal tuft of first distal phalanx concerning for osteomyelitis. MRI may be performed for further  evaluation; MRI ordered. The vascular evaluation performed on 5/24 field non-compressible ABIs bilaterally and reduced TBI bilaterally suggesting significant tibial disease. He will be referred to vascular medicine for further evaluation. 12/13/17-He is here in follow-up evaluation for right great toe ulcer. He has appointment next Thursday for the MRI and vascular evaluation. Wound culture obtained last week grew abundant Klebsiella oxytoca (multidrug sensitivity), and abundant enteric coccus faecalis (sensitive to ampicillin). Ampicillin and Cipro were called in, along with a probiotic. In light of his appointments next Thursday he will follow-up in 2 Gibson, we will continue with Santyl 12/27/17-He is here for evaluation for a right great toe ulcer. 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 He did have evaluation by vascular medicine, Dr. Ronalee Belts, on 6/13, at that appointment it was decided for him to undergo angiography with possible intervention but he refused and is still considering. If he chooses to have it done he will contact the office. He has not heard back from either ID offices that he was referred to two Gibson ago: [place] and [place]. there is improvement in both appearance and measurement to the ulcer. We will extend antibiotic therapy (amoxicillin 500 every 12, Cipro 500 daily) for additional 2 Gibson pending infectious disease consult, he will continue  with Santyl daily. He was reminded to continue with probiotic therapy while on oral antibiotics; he denies any GI disturbance. 01/03/18-He is here in follow-up evaluation for right great toe ulcer. He is decided to not undergo any vascular intervention at this time. He was established with [facility] infectious disease (Dr. Linus Salmons) last week initiated on vancomycin and cefazolin with dialysis Gibson for 6 Gibson. We will continue with current Gibson plan  with Santyl and offloading and he will follow-up in 2 Gibson 01/17/18-He is seen in follow-up evaluation for right great toe ulcer. There is improvement in appearance with less nonviable tissue present. We will continue with same Gibson plan he will follow-up next week. He is tolerating IV antibiotics without any complications 9/89/21-JH is seen in follow up evaluation for right great toe ulcer. There continues to be improvement. He is tolerating IV antibiotics with hemodialysis, completion date of 7/31. We will transition to collagen and and follow-up next week 02/07/18-He is here in follow up for a right great toe ulcer. There is red granulation tissue throughout the wound, improved in appearance. He completed antibiotics yesterday. He saw podiatry last Thursday for nail trimming, at that appointment he periwound callus was trimmed and a culture was obtained; culture negative. He does not have a follow up appointment with infectious disease. We will submit for grafix. 02/14/18-He is here in follow-up evaluation for a right great toe ulcer. There is slow improvement, red granulation tissue throughout. The insurance approval for grafix is pending. We will continue with collagen and he'll follow-up next week 02/21/18-He is seen in follow-up evaluation for right great toe ulcer. There is improvement with red granulation tissue throughout. He was approved for oasis and this was placed today. He will follow up next week 02/28/18-He is seen in follow-up evaluation for right great toe ulcer. The wound is stable, oasis applied and he'll follow-up next week 03/07/18-He is seen in follow-up violation of her right great toe ulcer. The wound is dry, no evidence of drainage. The wound appears healed today. He was painted with Betadine, instructed to paint with Betadine daily, cover with band-aid for the next week and then cover with band-aid for the following week continue with surgical shoe. He was encouraged to  contact the clinic with any evidence of drainage, or change in appearance to toe/wound. He will be discharged from wound care services Readmission: 07/14/18 on evaluation today patient presents for initial evaluation our clinic concerning issues that he is having with his left great toe. We previously saw Timothy Gibson, Timothy Gibson ([id number]) him in 2019 for his right great toe which was actually worse at that time and subsequently was able to be completely healed in the end although it did take several months. With that being said the patient is currently having an area on his left great toe that occurred initially as result of a blister that came up he's not really sure why or how. Subsequently this was initially treated by his podiatrist though the recommendation there was apparently amputation according to the patient and his daughter who was present with him today. With that being said the patient did not want to proceed with amputation and would like to try to perform wound care and get this to heal without having to go down that road. We were able to do that with his other toe and so he would like to at least give that a try before going forward with amputation. Again I completely understand his concern in this regard.  With that being said I did discuss with the patient obviously that it's always a chance that we cannot get this to heal with normal wound care measures but obviously we will give it our best shot. He does actually have an appointment scheduled for later today with the vascular specialist in order to evaluate his blood flow. That was at the recommendation of his podiatrist as well. Obviously if he is having good arterial flow and nonetheless is still experiencing issues with having the wound delay in healing then it may simply be a matter of we need to initiate more aggressive wound to therapy in order to see if we can get this to heal. No fevers, chills, nausea, or vomiting noted at this  time. The patient notes that this woman has been present for approximately three Gibson. He is not a smoker. His most recent hemoglobin A1c was 6.2 on 06/18/19. 07/17/2019 on evaluation today patient appears unfortunately to not be doing as well. He did see vascular I did review the note as well. That was on 07/15/2019. Subsequently there can actually be taking him to the OR for an angiogram due to poor arterial flow into this extremity. Apparently the atherosclerotic changes were severe. The patient is stated to be at risk for limb loss. Nonetheless the good news is this is being scheduled fairly quickly he will have the surgery/procedure next Thursday. With that being said I am going also go ahead based on the results of his x-ray which showed some chance of osteomyelitis in the distal portion of the toe get the patient set up for an MRI to further evaluate for the possibility of osteomyelitis obviously if he does have osteomyelitis we need to know so that we can address this appropriately. 07/29/2019 upon evaluation today patient appears to be doing better in general with regard to his toe ulcer. He does have much better blood flow you can actually feel a palpable pulse which appears to be very strong at this time. I did review his arterial intervention/angiogram which did show that he had evidence of sufficient arterial flow restoration at this point. He did have occlusions that were 60 and 80% that residually were only 10 and 25% with no limiting flow. This is excellent news and hopefully he will continue to show signs of improvement in light of the improved vascular status. With that being said the toe mainly shows somewhat of an eschar today I think that this is fairly stable and as such probably would be best not to really do much in the way of debridement at this point I do believe Betadine would likely be a good option. 08/07/2019 on evaluation today patient actually appears to be doing about the  same. The one difference is the eschar that we were just seeing if we can maintain is actually starting to loosen up and leak around the edges I think it is time to actually go ahead and remove this today. Is also no longer stable is very soft. Nonetheless the patient is okay with proceeding with debridement at this point. Fortunately there is no signs of active infection. No fevers, chills, nausea, vomiting, or diarrhea. The patient states that he took his last antibiotic that he had last night. He would need a refill as he will not be seeing infectious disease until February 2. Nonetheless obviously I do not want him to have any break in therapy especially when he is doing as well as he is currently. 08/14/2019 on evaluation  today patient appears to be doing quite well all things considered in regard to his toe. I do feel like he is making progress and though this is not completely healed by any means we still have some ways to go I feel like he is making progress which is excellent. There is no sign of active infection at this time systemically. He does have osteomyelitis of the distal phalanx of his great toe on the left. He did see Dr. Linus Salmons. That is the infectious disease doctor in [place] where he was referred. Dr. Linus Salmons apparently according to the note did not feel like the patient's MRI revealed osteomyelitis but just reactive marrow and subsequently did not recommend any antibiotic therapy whatsoever. To be peripherally honest I am not really in agreement with this based on the MRI results and what we are seeing and I think he has been responding at least decently well to oral medication as well. Nonetheless we may want to check into the possibility of IV antibiotics being administered at the dialysis center. I will discuss this with Dr. Dellia Nims further. 08/21/2019 upon evaluation today patient appears at this point to be making some progress with regard to his wounds. He has been tolerating  the dressing changes without complication. Fortunately there is no evidence of active infection at this time. Overall I feel like he is doing well with the oral antibiotics I discussed with Dr. Dellia Nims the possibility of doing IV antibiotic therapy versus continuing with the oral. His opinion was if the patient is doing well with oral to maybe stick with that. Obviously we can initiate additional Cipro as well just to help ensure that there is nothing worsening here. The Cipro should be beneficial for him as well. That helps cover more gram-negative's were is at the doxycycline is more gram-positive's. 09/02/2019 on evaluation today patient appears to be doing fairly well upon inspection today. With that being said though the patient is continuing to do well he does also continue to develop issues with buildup of necrotic tissue on the distal portion of his toe he does have good arterial flow however. For that reason I am going to suggest that what we may need to do is consider using something such as Santyl to try to keep this free and clear. Also discussed in greater detail HBO therapy with him today please see plan for additional recommendations and results of this discussion. 09/11/2019 upon evaluation today patient appears to be doing well with regard to his toe ulcer. This actually appears to be much better today using the Santyl than it was previous. Fortunately there is no signs of active infection. No fevers, chills, nausea, vomiting, or diarrhea. 09/18/2019 upon evaluation today patient appears to be doing much better in regard to his wound in general. The Santyl was doing a good job at loosening stuff up here for [place] which is excellent news. With that being said he is still taking the oral antibiotic. I sent this in last for him on 09/03/2019. This was the doxycycline. When this runs out he will actually be complete as far as the Gibson is concerned. With that being said I am getting continue  the Cipro for 1 additional month. Overall his wound seems to be doing quite well. 09/25/2019 upon evaluation today patient appears to be doing well with regard to his toe ulcer. This is measuring much better and overall seems to be making good progress. Fortunately there is no signs of active infection at this  time. No fevers, chills, nausea, vomiting, or diarrhea. 10/02/2019 upon evaluation today patient appears to be doing well with regard to his wound. He has been tolerating the dressing changes without complication. Fortunately there is no signs of active infection at this time. No fevers, chills, nausea, vomiting, or diarrhea. Overall I am extremely pleased with how things seem to be progressing. I think the wound bed is at the point now where we can likely proceed with additional measures to try to get this healed more quickly I think Dermagraft may be a good possibility for the patient. We can definitely look into seeing about approval for this. Timothy Gibson, Timothy Gibson ([id number]) Objective Constitutional Well-nourished and well-hydrated in no acute distress. Vitals Time Taken: 9:04 AM, Height: 76 in, Weight: 244 lbs, BMI: 29.7, Temperature: 98.0 F, Pulse: 83 bpm, Respiratory Rate: 16 breaths/min, Blood Pressure: 146/77 mmHg. Respiratory normal breathing without difficulty. Psychiatric this patient is able to make decisions and demonstrates good insight into disease process. Alert and Oriented x 3. pleasant and cooperative. General Notes: Patient's wound bed currently showed signs of good granulation at this time. Fortunately there is no evidence of active infection which is great news. No fevers, chills, nausea, vomiting, or diarrhea. Integumentary (Hair, Skin) Wound #3 status is Open. Original cause of wound was Gradually Appeared. The wound is located on the Left Toe Great. The wound measures 1.2cm length x 1.9cm width x 0.3cm depth; 1.791cm^2 area and 0.537cm^3 volume. There is Fat Layer  (Subcutaneous Tissue) Exposed exposed. There is a medium amount of serous drainage noted. The wound margin is flat and intact. There is medium (34-66%) pink granulation within the wound bed. There is a medium (34-66%) amount of necrotic tissue within the wound bed including Adherent Slough. 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 There was a Excisional Skin/Subcutaneous Tissue Debridement with a total area of 2.28 sq cm performed by Timothy III, Whisper Kurka E., PA-C. With the following instrument(s): Curette to remove Viable and Non-Viable tissue/material. Material removed includes Callus, Subcutaneous Tissue, and Slough after achieving pain control using Lidocaine. A time out was conducted at 09:41, prior to the start of the procedure. A Minimum amount of bleeding was controlled with Pressure. The procedure was tolerated well. Post Debridement Measurements: 1.2cm length x 1.9cm width x 0.3cm depth; 0.537cm^3 volume. Character of Wound/Ulcer Post Debridement is stable. Severity of Tissue Post Debridement is: Fat layer exposed. Post procedure Diagnosis Wound #3: Same as Pre-Procedure Plan Wound Cleansing: Timothy Gibson, Timothy Gibson ([id number]) Wound #3 Left Toe Great: Dial antibacterial soap, wash wounds, rinse and pat dry prior to dressing wounds May Shower, gently pat wound dry prior to applying new dressing. Anesthetic (add to Medication List): Wound #3 Left Toe Great: Topical Lidocaine 4% cream applied to wound bed prior to debridement (In Clinic Only). Primary Wound Dressing: Wound #3 Left Toe Great: Santyl  Ointment Secondary Dressing: Wound #3 Left Toe Great: Dry Gauze Conform/Kerlix Foam Dressing Change Frequency: Wound #3 Left Toe Great: Change dressing every day. Follow-up Appointments: Wound #3 Left Toe Great: Return Appointment in 1 week. 1. Based on what I am seeing currently I think I would recommend continuing with the  Santyl for 1 more week though I do feel like the patient is making good progress I feel like we may be able to do something to speed this up as well I think Dermagraft or Apligraf would be a good option we will look into approval for this. 2. I am also can recommend at this time that we continue with the [facility] for 1 more week that way we can hopefully make sure the wound bed is nice and clean and ready to go in preparation for applying the skin substitute if were able to get approval. If not I may still consider something along the lines of endoform to try to see if we can speed things up from a healing perspective. 3. I am also can recommend the patient continue with the offloading shoe obviously wants to keep any pressure off of the area. We will see patient back for reevaluation in 1 week here in the clinic. If anything worsens or changes patient will contact our office for additional recommendations. Electronic Signature(s) Signed: 10/02/2019 9:52:08 AM By: Timothy Keeler PA-C Entered By: Timothy Gibson on 10/02/2019 09:52:07 Timothy Gibson, Timothy Gibson ([id number]) -------------------------------------------------------------------------------- SuperBill Details Patient Name: Timothy Gibson Date of Service: 10/02/2019 Medical Record Number: [id number] Patient Account Number: [id number] Date of Birth/Sex: [birth year] (68 y.o. M) Treating RN: Timothy Gibson Primary Care Provider: Merrie Gibson Other Clinician: Referring Provider: Merrie Gibson Treating Provider/Extender: Timothy Gibson, Maile Linford Gibson in Gibson: 11 Diagnosis Coding ICD-10 Codes Code Description [phone]  Other chronic osteomyelitis, left ankle and foot E11.621 Type 2 diabetes mellitus with foot ulcer L97.522 Non-pressure chronic ulcer of other part of left foot with fat layer exposed I10 Essential (primary) hypertension Facility Procedures CPT4 Code: 56812751 Description: 70017 - DEB SUBQ TISSUE 20 SQ CM/< Modifier: Quantity: 1 CPT4 Code: Description: ICD-10 Diagnosis Description L97.522 Non-pressure chronic ulcer of other part of left foot with fat layer exposed Modifier: Quantity: Physician Procedures CPT4 Code: 4944967 Description: 11042 - WC PHYS SUBQ TISS 20 SQ CM Modifier: Quantity: 1 CPT4 Code: Description: ICD-10 Diagnosis Description L97.522 Non-pressure chronic ulcer of other part of left foot with fat layer exposed Modifier: Quantity: Electronic Signature(s) Signed: 10/02/2019 9:52:20 AM By: Timothy Keeler PA-C Entered By: Timothy Gibson on 10/02/2019 09:52:19

## 2019-10-03 DIAGNOSIS — Z992 Dependence on renal dialysis: Secondary | ICD-10-CM | POA: Diagnosis not present

## 2019-10-03 DIAGNOSIS — D631 Anemia in chronic kidney disease: Secondary | ICD-10-CM | POA: Diagnosis not present

## 2019-10-03 DIAGNOSIS — N2581 Secondary hyperparathyroidism of renal origin: Secondary | ICD-10-CM | POA: Diagnosis not present

## 2019-10-03 DIAGNOSIS — N186 End stage renal disease: Secondary | ICD-10-CM | POA: Diagnosis not present

## 2019-10-03 DIAGNOSIS — D509 Iron deficiency anemia, unspecified: Secondary | ICD-10-CM | POA: Diagnosis not present

## 2019-10-06 DIAGNOSIS — Z992 Dependence on renal dialysis: Secondary | ICD-10-CM | POA: Diagnosis not present

## 2019-10-06 DIAGNOSIS — N186 End stage renal disease: Secondary | ICD-10-CM | POA: Diagnosis not present

## 2019-10-06 DIAGNOSIS — D509 Iron deficiency anemia, unspecified: Secondary | ICD-10-CM | POA: Diagnosis not present

## 2019-10-06 DIAGNOSIS — N2581 Secondary hyperparathyroidism of renal origin: Secondary | ICD-10-CM | POA: Diagnosis not present

## 2019-10-06 DIAGNOSIS — D631 Anemia in chronic kidney disease: Secondary | ICD-10-CM | POA: Diagnosis not present

## 2019-10-08 DIAGNOSIS — N186 End stage renal disease: Secondary | ICD-10-CM | POA: Diagnosis not present

## 2019-10-08 DIAGNOSIS — D631 Anemia in chronic kidney disease: Secondary | ICD-10-CM | POA: Diagnosis not present

## 2019-10-08 DIAGNOSIS — Z992 Dependence on renal dialysis: Secondary | ICD-10-CM | POA: Diagnosis not present

## 2019-10-08 DIAGNOSIS — N2581 Secondary hyperparathyroidism of renal origin: Secondary | ICD-10-CM | POA: Diagnosis not present

## 2019-10-08 DIAGNOSIS — D509 Iron deficiency anemia, unspecified: Secondary | ICD-10-CM | POA: Diagnosis not present

## 2019-10-09 ENCOUNTER — Encounter: Payer: Medicare Other | Attending: Physician Assistant | Admitting: Physician Assistant

## 2019-10-09 ENCOUNTER — Other Ambulatory Visit: Payer: Self-pay

## 2019-10-09 DIAGNOSIS — M86672 Other chronic osteomyelitis, left ankle and foot: Secondary | ICD-10-CM | POA: Diagnosis not present

## 2019-10-09 DIAGNOSIS — E1122 Type 2 diabetes mellitus with diabetic chronic kidney disease: Secondary | ICD-10-CM | POA: Diagnosis not present

## 2019-10-09 DIAGNOSIS — N186 End stage renal disease: Secondary | ICD-10-CM | POA: Diagnosis not present

## 2019-10-09 DIAGNOSIS — E1169 Type 2 diabetes mellitus with other specified complication: Secondary | ICD-10-CM | POA: Insufficient documentation

## 2019-10-09 DIAGNOSIS — I120 Hypertensive chronic kidney disease with stage 5 chronic kidney disease or end stage renal disease: Secondary | ICD-10-CM | POA: Diagnosis not present

## 2019-10-09 DIAGNOSIS — E11621 Type 2 diabetes mellitus with foot ulcer: Secondary | ICD-10-CM | POA: Diagnosis not present

## 2019-10-09 DIAGNOSIS — F17290 Nicotine dependence, other tobacco product, uncomplicated: Secondary | ICD-10-CM | POA: Insufficient documentation

## 2019-10-09 DIAGNOSIS — Z992 Dependence on renal dialysis: Secondary | ICD-10-CM | POA: Diagnosis not present

## 2019-10-09 DIAGNOSIS — L97522 Non-pressure chronic ulcer of other part of left foot with fat layer exposed: Secondary | ICD-10-CM | POA: Diagnosis not present

## 2019-10-09 DIAGNOSIS — M069 Rheumatoid arthritis, unspecified: Secondary | ICD-10-CM | POA: Diagnosis not present

## 2019-10-09 NOTE — Progress Notes (Addendum)
Timothy Gibson, Timothy Gibson ([id number]) Visit Report for 10/09/2019 Chief Complaint Document Details Patient Name: Timothy Gibson, Timothy Gibson Date of Service: 10/09/2019 10:15 AM Medical Record Number: [id number] Patient Account Number: [id number] Date of Birth/Sex: 1951-06-26 (69 y.o. M) Treating RN: Army Melia Primary Care Provider: Merrie Roof Other Clinician: Referring Provider: Merrie Roof Treating Provider/Extender: Melburn Hake, Kensli Bowley Weeks in Treatment: 12 Information Obtained from: Patient Chief Complaint Left Great Toe Electronic Signature(s) Signed: 10/09/2019 10:07:52 AM By: Worthy Keeler PA-C Entered By: Worthy Keeler on 10/09/2019 10:07:52 Timothy Gibson ([id number]) -------------------------------------------------------------------------------- Cellular or Tissue Based Product Details Patient Name: Timothy Gibson Date of Service: 10/09/2019 10:15 AM Medical Record Number: [id number] Patient Account Number: [id number] Date of Birth/Sex: 20-Sep-1950 (68 y.o. M) Treating RN: Army Melia Primary Care Provider: Merrie Roof Other Clinician: Referring Provider: Merrie Roof Treating Provider/Extender: Melburn Hake, Seraphina Mitchner Weeks in Treatment: 12 Cellular or Tissue Based Product Type Wound #3 Left Toe Great Applied to: Performed By: Physician [name], Denyse Fillion E., PA-C Cellular or Tissue Based Product Type: Apligraf Level of Consciousness (Pre- Awake and Alert procedure): Pre-procedure Verification/Time Out Yes - 10:30 Taken: Location: genitalia / hands / feet / multiple digits Wound Size (sq cm): 1.7 Product Size (sq cm): 44 Waste Size (sq cm): 30 Waste Reason: wound size Amount of Product Applied (sq cm): 14 Instrument Used: Forceps, Scissors Lot #: [id number] Expiration Date: 10/21/2019 Fenestrated: Yes Instrument: Blade Reconstituted: Yes Solution Type: normal saline Solution Amount: 5ML Lot #: [id number] Solution Expiration Date: 02/07/2021 Secured: Yes Secured With:  Steri-Strips Dressing Applied: Yes Primary Dressing: mepitel one Response to Treatment: Procedure was tolerated well Level of Consciousness (Post- Awake and Alert procedure): Post Procedure Diagnosis Same as Pre-procedure Electronic Signature(s) Signed: 10/09/2019 3:58:38 PM By: Army Melia Entered By: Army Melia on 10/09/2019 10:47:53 Timothy Gibson ([id number]) -------------------------------------------------------------------------------- Debridement Details Patient Name: Timothy Gibson Date of Service: 10/09/2019 10:15 AM Medical Record Number: [id number] Patient Account Number: [id number] Date of Birth/Sex: 1950-07-18 (69 y.o. M) Treating RN: Army Melia Primary Care Provider: Merrie Roof Other Clinician: Referring Provider: Merrie Roof Treating Provider/Extender: Melburn Hake, Muhsin Doris Weeks in Treatment: 12 Debridement Performed for Wound #3 Left Toe Great Assessment: Performed By: Physician [name], Kaedin Hicklin E., PA-C Debridement Type: Debridement Severity of Tissue Pre Debridement: Fat layer exposed Level of Consciousness (Pre- Awake and Alert procedure): Pre-procedure Verification/Time Out Yes - 10:23 Taken: Start Time: 10:24 Pain Control: Lidocaine Total Area Debrided (L x W): 1.7 (cm) x 1 (cm) = 1.7 (cm) Tissue and other material debrided: Viable, Non-Viable, Callus, Subcutaneous Level: Skin/Subcutaneous Tissue Debridement Description: Excisional Instrument: Curette Bleeding: Minimum Hemostasis Achieved: Pressure End Time: 10:25 Response to Treatment: Procedure was tolerated well Level of Consciousness (Post- Awake and Alert procedure): Post Debridement Measurements of Total Wound Length: (cm) 1.7 Width: (cm) 1 Depth: (cm) 0.2 Volume: (cm) 0.267 Character of Wound/Ulcer Post Debridement: Stable Severity of Tissue Post Debridement: Fat layer exposed Post Procedure Diagnosis Same as Pre-procedure Electronic Signature(s) Signed: 10/09/2019 3:58:38 PM  By: Army Melia Signed: 10/09/2019 5:29:46 PM By: Worthy Keeler PA-C Entered By: Army Melia on 10/09/2019 10:26:05 Timothy Gibson ([id number]) -------------------------------------------------------------------------------- HPI Details Patient Name: Timothy Gibson Date of Service: 10/09/2019 10:15 AM Medical Record Number: [id number] Patient Account Number: [id number] Date of Birth/Sex: 05/26/1951 (69 y.o. M) Treating RN: Army Melia Primary Care Provider: Merrie Roof Other Clinician: Referring Provider: Merrie Roof Treating Provider/Extender: Melburn Hake, Rachell Druckenmiller Weeks in Treatment: 12 History of Present Illness HPI Description: 11/27/17 on evaluation today  patient presents for initial evaluation concerning an issue that he has been having with his right great toe which began last Thursday 11/22/17. He has a history of diabetes mellitus type to which he has had for greater than 30 years currently he is on insulin. Subsequently he also has a history of hypertension. On physical exam inspection is also appears he Timothy Gibson have some peripheral vascular disease with his ABI's being noncompressible bilaterally. He is on dialysis as well and this is on Monday, Wednesday, and Friday. Patient was hospitalized back in January/February 2019 although this was more for stomach/back pain though they never told me exactly what was going on. This is according to the patient. Subsequently the ulcer which is between the third and fourth toe when space of the right foot as well is on the plantar aspect of the fourth toe is due to him having cleaned his toes this morning and he states that his finger which is large split the toe tissue in between causing the wounds that we currently see. With that being said he states this has happened before I explained I would definitely recommend that he not do this any longer. He can use a small soft washcloth to clean between the toes without causing trauma. Subsequently the  right great toe hatchery does show evidence of necrotic tissue present on the surface of the wound specifically there is callous and dead skin surrounding which is trapping fluid causing problems as far as the wound is concerned. The surface of the wound does show slough although due to his vascular flow I'm not going to sharply debride this today I think I will selectively debride away the necrotic skin as well is callous from around the surrounding so this will not continue to be a moisture issue. Nonetheless the patient has no pain he does have diabetic neuropathy. 12/06/17-He is here in follow-up evaluation for a right great toe ulcer. He is voicing no complaints or concerns. He continues to infrequently/socially smokes cigars with no desire to quit. He has been compliant with offloading, using open toed surgical shoe; has been compliant using Santyl daily. He continues on clindamycin although takes it inconsistently secondary to indigestion. The plain film x-ray performed on 5/23 impression: Soft tissue swelling of the left first toe and is noted with probable irregular lucency involving distal tuft of first distal phalanx concerning for osteomyelitis. MRI Timothy Gibson be performed for further evaluation; MRI ordered. The vascular evaluation performed on 5/24 field non-compressible ABIs bilaterally and reduced TBI bilaterally suggesting significant tibial disease. He will be referred to vascular medicine for further evaluation. 12/13/17-He is here in follow-up evaluation for right great toe ulcer. He has appointment next Thursday for the MRI and vascular evaluation. Wound culture obtained last week grew abundant Klebsiella oxytoca (multidrug sensitivity), and abundant enteric coccus faecalis (sensitive to ampicillin). Ampicillin and Cipro were called in, along with a probiotic. In light of his appointments next Thursday he will follow-up in 2 weeks, we will continue with Santyl 12/27/17-He is here for  evaluation for a right great toe ulcer. 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 He did have evaluation by vascular medicine, Dr. Ronalee Belts, on 6/13, at that appointment it was decided for him to undergo angiography with possible intervention but he refused and is still considering. If he chooses to have it done he will contact the office. He has not heard back from either ID offices  that he was referred to two weeks ago: [place] and [place]. there is improvement in both appearance and measurement to the ulcer. We will extend antibiotic therapy (amoxicillin 500 every 12, Cipro 500 daily) for additional 2 weeks pending infectious disease consult, he will continue with Santyl daily. He was reminded to continue with probiotic therapy while on oral antibiotics; he denies any GI disturbance. 01/03/18-He is here in follow-up evaluation for right great toe ulcer. He is decided to not undergo any vascular intervention at this time. He was established with [facility] infectious disease (Dr. Linus Salmons) last week initiated on vancomycin and cefazolin with dialysis treatment for 6 weeks. We will continue with current treatment plan with Santyl and offloading and he will follow-up in 2 weeks 01/17/18-He is seen in follow-up evaluation for right great toe ulcer. There is improvement in appearance with less nonviable tissue present. We will continue with same treatment plan he will follow-up next week. He is tolerating IV antibiotics without any complications 7/35/32-DJ is seen in follow up evaluation for right great toe ulcer. There continues to be improvement. He is tolerating IV antibiotics with hemodialysis, completion date of 7/31. We will transition to collagen and and follow-up next week 02/07/18-He is here in follow up for a right great toe ulcer. There is red granulation tissue throughout the wound, improved in appearance. He  completed antibiotics yesterday. He saw podiatry last Thursday for nail trimming, at that appointment he periwound callus was trimmed and a culture was obtained; culture negative. He does not have a follow up appointment with infectious disease. We will submit for grafix. 02/14/18-He is here in follow-up evaluation for a right great toe ulcer. There is slow improvement, red granulation tissue throughout. The insurance approval for grafix is pending. We will continue with collagen and he'll follow-up next week 02/21/18-He is seen in follow-up evaluation for right great toe ulcer. There is improvement with red granulation tissue throughout. He was approved for oasis and this was placed today. He will follow up next week 02/28/18-He is seen in follow-up evaluation for right great toe ulcer. The wound is stable, oasis applied and he'll follow-up next week 03/07/18-He is seen in follow-up violation of her right great toe ulcer. The wound is dry, no evidence of drainage. The wound appears healed today. He was painted with Betadine, instructed to paint with Betadine daily, cover with band-aid for the next week and then cover with band-aid for the following week continue with surgical shoe. He was encouraged to contact the clinic with any evidence of drainage, or change in appearance to toe/wound. He will be discharged from wound care services Readmission: 07/14/18 on evaluation today patient presents for initial evaluation our clinic concerning issues that he is having with his left great toe. We previously saw him in 2019 for his right great toe which was actually worse at that time and subsequently was able to be completely healed in the end although it did take several months. With that being said the patient is currently having an area on his left great toe that occurred initially as result of a blister that came up he's not really sure why or how. Subsequently this was initially treated by his podiatrist  though the recommendation there was apparently amputation according to the patient and his daughter who was present with him today. With that being said the patient did not want to proceed with amputation and would like to try to perform wound care and get this to heal without having  to go down that road. We were able to do that with his other Timothy Gibson, LIA. ([id number]) toe and so he would like to at least give that a try before going forward with amputation. Again I completely understand his concern in this regard. With that being said I did discuss with the patient obviously that it's always a chance that we cannot get this to heal with normal wound care measures but obviously we will give it our best shot. He does actually have an appointment scheduled for later today with the vascular specialist in order to evaluate his blood flow. That was at the recommendation of his podiatrist as well. Obviously if he is having good arterial flow and nonetheless is still experiencing issues with having the wound delay in healing then it Timothy Gibson simply be a matter of we need to initiate more aggressive wound to therapy in order to see if we can get this to heal. No fevers, chills, nausea, or vomiting noted at this time. The patient notes that this woman has been present for approximately three weeks. He is not a smoker. His most recent hemoglobin A1c was 6.2 on 06/18/19. 07/17/2019 on evaluation today patient appears unfortunately to not be doing as well. He did see vascular I did review the note as well. That was on 07/15/2019. Subsequently there can actually be taking him to the OR for an angiogram due to poor arterial flow into this extremity. Apparently the atherosclerotic changes were severe. The patient is stated to be at risk for limb loss. Nonetheless the good news is this is being scheduled fairly quickly he will have the surgery/procedure next Thursday. With that being said I am going also go ahead based on  the results of his x-ray which showed some chance of osteomyelitis in the distal portion of the toe get the patient set up for an MRI to further evaluate for the possibility of osteomyelitis obviously if he does have osteomyelitis we need to know so that we can address this appropriately. 07/29/2019 upon evaluation today patient appears to be doing better in general with regard to his toe ulcer. He does have much better blood flow you can actually feel a palpable pulse which appears to be very strong at this time. I did review his arterial intervention/angiogram which did show that he had evidence of sufficient arterial flow restoration at this point. He did have occlusions that were 60 and 80% that residually were only 10 and 25% with no limiting flow. This is excellent news and hopefully he will continue to show signs of improvement in light of the improved vascular status. With that being said the toe mainly shows somewhat of an eschar today I think that this is fairly stable and as such probably would be best not to really do much in the way of debridement at this point I do believe Betadine would likely be a good option. 08/07/2019 on evaluation today patient actually appears to be doing about the same. The one difference is the eschar that we were just seeing if we can maintain is actually starting to loosen up and leak around the edges I think it is time to actually go ahead and remove this today. Is also no longer stable is very soft. Nonetheless the patient is okay with proceeding with debridement at this point. Fortunately there is no signs of active infection. No fevers, chills, nausea, vomiting, or diarrhea. The patient states that he took his last antibiotic that he had last  night. He would need a refill as he will not be seeing infectious disease until February 2. Nonetheless obviously I do not want him to have any break in therapy especially when he is doing as well as he is  currently. 08/14/2019 on evaluation today patient appears to be doing quite well all things considered in regard to his toe. I do feel like he is making progress and though this is not completely healed by any means we still have some ways to go I feel like he is making progress which is excellent. There is no sign of active infection at this time systemically. He does have osteomyelitis of the distal phalanx of his great toe on the left. He did see Dr. Linus Salmons. That is the infectious disease doctor in [place] where he was referred. Dr. Linus Salmons apparently according to the note did not feel like the patient's MRI revealed osteomyelitis but just reactive marrow and subsequently did not recommend any antibiotic therapy whatsoever. To be peripherally honest I am not really in agreement with this based on the MRI results and what we are seeing and I think he has been responding at least decently well to oral medication as well. Nonetheless we Timothy Gibson want to check into the possibility of IV antibiotics being administered at the dialysis center. I will discuss this with Dr. Dellia Nims further. 08/21/2019 upon evaluation today patient appears at this point to be making some progress with regard to his wounds. He has been tolerating the dressing changes without complication. Fortunately there is no evidence of active infection at this time. Overall I feel like he is doing well with the oral antibiotics I discussed with Dr. Dellia Nims the possibility of doing IV antibiotic therapy versus continuing with the oral. His opinion was if the patient is doing well with oral to maybe stick with that. Obviously we can initiate additional Cipro as well just to help ensure that there is nothing worsening here. The Cipro should be beneficial for him as well. That helps cover more gram-negative's were is at the doxycycline is more gram-positive's. 09/02/2019 on evaluation today patient appears to be doing fairly well upon inspection today.  With that being said though the patient is continuing to do well he does also continue to develop issues with buildup of necrotic tissue on the distal portion of his toe he does have good arterial flow however. For that reason I am going to suggest that what we Timothy Gibson need to do is consider using something such as Santyl to try to keep this free and clear. Also discussed in greater detail HBO therapy with him today please see plan for additional recommendations and results of this discussion. 09/11/2019 upon evaluation today patient appears to be doing well with regard to his toe ulcer. This actually appears to be much better today using the Santyl than it was previous. Fortunately there is no signs of active infection. No fevers, chills, nausea, vomiting, or diarrhea. 09/18/2019 upon evaluation today patient appears to be doing much better in regard to his wound in general. The Santyl was doing a good job at loosening stuff up here for [place] which is excellent news. With that being said he is still taking the oral antibiotic. I sent this in last for him on 09/03/2019. This was the doxycycline. When this runs out he will actually be complete as far as the treatment is concerned. With that being said I am getting continue the Cipro for 1 additional month. Overall his wound seems  to be doing quite well. 09/25/2019 upon evaluation today patient appears to be doing well with regard to his toe ulcer. This is measuring much better and overall seems to be making good progress. Fortunately there is no signs of active infection at this time. No fevers, chills, nausea, vomiting, or diarrhea. 10/02/2019 upon evaluation today patient appears to be doing well with regard to his wound. He has been tolerating the dressing changes without complication. Fortunately there is no signs of active infection at this time. No fevers, chills, nausea, vomiting, or diarrhea. Overall I am extremely pleased with how things seem to be  progressing. I think the wound bed is at the point now where we can likely proceed with additional measures to try to get this healed more quickly I think Dermagraft Timothy Gibson be a good possibility for the patient. We can definitely look into seeing about approval for this. 10/09/2019 upon evaluation today patient's wound actually appears to be doing well although it still is progressing somewhat slowly compared to what I would like to see. With that being said I did order Apligraf for the patient which I felt would keep the wound nice and moist without allowing it to dry out and hopefully allow this to heal much more effectively and quickly. The patient actually did obtain approval and therefore we can apply that today. Electronic Signature(s) Signed: 10/09/2019 11:18:03 AM By: Wende Mott ([id number]) Entered By: Worthy Keeler on 10/09/2019 11:18:01 Timothy Gibson, Timothy Gibson ([id number]) -------------------------------------------------------------------------------- Physical Exam Details Patient Name: Timothy Gibson Date of Service: 10/09/2019 10:15 AM Medical Record Number: [id number] Patient Account Number: [id number] Date of Birth/Sex: 10-13-1950 (68 y.o. M) Treating RN: Army Melia Primary Care Provider: Merrie Roof Other Clinician: Referring Provider: Merrie Roof Treating Provider/Extender: [name], Athony Coppa Weeks in Treatment: 63 Constitutional Well-nourished and well-hydrated in no acute distress. Respiratory normal breathing without difficulty. Psychiatric this patient is able to make decisions and demonstrates good insight into disease process. Alert and Oriented x 3. pleasant and cooperative. Notes Patient's wound bed currently showed signs of some slough noted on the surface of the wound I did perform sharp debridement to clear away necrotic tissue at this point. Fortunately the patient tolerated this well there was some bleeding I had to use a little bit of  silver nitrate to chemically cauterize one area to stop the bleeding. Other than this the patient did very well and then I did apply the Apligraf to the wound bed followed by securing this with Mepitel and then Steri-Strips after applying skin prep. I then subsequently use drawtex externally to hold this in place as a bolster behind. We will also obviously apply dry gauze dressing. Electronic Signature(s) Signed: 10/09/2019 11:18:57 AM By: Worthy Keeler PA-C Entered By: Worthy Keeler on 10/09/2019 11:18:57 Timothy Gibson, Timothy Gibson ([id number]) -------------------------------------------------------------------------------- Physician Orders Details Patient Name: Timothy Gibson Date of Service: 10/09/2019 10:15 AM Medical Record Number: [id number] Patient Account Number: [id number] Date of Birth/Sex: [birth year] (68 y.o. M) Treating RN: Army Melia Primary Care Provider: Merrie Roof Other Clinician: Referring Provider: Merrie Roof Treating Provider/Extender: Melburn Hake, Zaira Iacovelli Weeks in Treatment: 12 Verbal / Phone Orders: No Diagnosis Coding ICD-10 Coding Code Description [phone] Other chronic osteomyelitis, left ankle and foot E11.621 Type 2 diabetes mellitus with foot ulcer L97.522 Non-pressure chronic ulcer of other part of left foot with fat layer exposed I10 Essential (primary) hypertension Wound Cleansing Wound #3 Left Toe Great o Dial antibacterial soap, wash wounds, rinse  and pat dry prior to dressing wounds o Timothy Gibson Shower, gently pat wound dry prior to applying new dressing. Anesthetic (add to Medication List) Wound #3 Left Toe Great o Topical Lidocaine 4% cream applied to wound bed prior to debridement (In Clinic Only). Primary Wound Dressing Wound #3 Left Toe Great o Other: - Apligraft applied Secondary Dressing Wound #3 Left Toe Great o Conform/Kerlix o Drawtex Dressing Change Frequency Wound #3 Left Toe Great o Other: - As needed Follow-up  Appointments Wound #3 Left Toe Great o Return Appointment in 2 weeks. Advanced Therapies Wound #3 Left Toe Great o Apligraf application in clinic; including contact layer, fixation with steri strips, dry gauze and cover dressing. Electronic Signature(s) Signed: 10/09/2019 3:58:38 PM By: Army Melia Signed: 10/09/2019 5:29:46 PM By: Worthy Keeler PA-C Entered By: Army Melia on 10/09/2019 10:49:53 Timothy Gibson, Timothy Gibson ([id number]) -------------------------------------------------------------------------------- Problem List Details Patient Name: Timothy Gibson Date of Service: 10/09/2019 10:15 AM Medical Record Number: [id number] Patient Account Number: [id number] Date of Birth/Sex: 1951/05/11 (69 y.o. M) Treating RN: Army Melia Primary Care Provider: Merrie Roof Other Clinician: Referring Provider: Merrie Roof Treating Provider/Extender: Melburn Hake, Tayona Sarnowski Weeks in Treatment: 12 Active Problems ICD-10 Evaluated Encounter Code Description Active Date Today Diagnosis [phone] Other chronic osteomyelitis, left ankle and foot 07/29/2019 No Yes E11.621 Type 2 diabetes mellitus with foot ulcer 07/15/2019 No Yes L97.522 Non-pressure chronic ulcer of other part of left foot with fat layer exposed 07/15/2019 No Yes I10 Essential (primary) hypertension 07/15/2019 No Yes Inactive Problems Resolved Problems Electronic Signature(s) Signed: 10/09/2019 10:07:45 AM By: Worthy Keeler PA-C Entered By: Worthy Keeler on 10/09/2019 10:07:45 Timothy Gibson ([id number]) -------------------------------------------------------------------------------- Progress Note Details Patient Name: Timothy Gibson Date of Service: 10/09/2019 10:15 AM Medical Record Number: [id number] Patient Account Number: [id number] Date of Birth/Sex: [birth year] (68 y.o. M) Treating RN: Army Melia Primary Care Provider: Merrie Roof Other Clinician: Referring Provider: Merrie Roof Treating Provider/Extender: Melburn Hake,  Oziah Vitanza Weeks in Treatment: 12 Subjective Chief Complaint Information obtained from Patient Left Great Toe History of Present Illness (HPI) 11/27/17 on evaluation today patient presents for initial evaluation concerning an issue that he has been having with his right great toe which began last Thursday 11/22/17. He has a history of diabetes mellitus type to which he has had for greater than 30 years currently he is on insulin. Subsequently he also has a history of hypertension. On physical exam inspection is also appears he Timothy Gibson have some peripheral vascular disease with his ABI's being noncompressible bilaterally. He is on dialysis as well and this is on Monday, Wednesday, and Friday. Patient was hospitalized back in January/February 2019 although this was more for stomach/back pain though they never told me exactly what was going on. This is according to the patient. Subsequently the ulcer which is between the third and fourth toe when space of the right foot as well is on the plantar aspect of the fourth toe is due to him having cleaned his toes this morning and he states that his finger which is large split the toe tissue in between causing the wounds that we currently see. With that being said he states this has happened before I explained I would definitely recommend that he not do this any longer. He can use a small soft washcloth to clean between the toes without causing trauma. Subsequently the right great toe hatchery does show evidence of necrotic tissue present on the surface of the wound specifically there is callous and  dead skin surrounding which is trapping fluid causing problems as far as the wound is concerned. The surface of the wound does show slough although due to his vascular flow I'm not going to sharply debride this today I think I will selectively debride away the necrotic skin as well is callous from around the surrounding so this will not continue to be a moisture issue.  Nonetheless the patient has no pain he does have diabetic neuropathy. 12/06/17-He is here in follow-up evaluation for a right great toe ulcer. He is voicing no complaints or concerns. He continues to infrequently/socially smokes cigars with no desire to quit. He has been compliant with offloading, using open toed surgical shoe; has been compliant using Santyl daily. He continues on clindamycin although takes it inconsistently secondary to indigestion. The plain film x-ray performed on 5/23 impression: Soft tissue swelling of the left first toe and is noted with probable irregular lucency involving distal tuft of first distal phalanx concerning for osteomyelitis. MRI Timothy Gibson be performed for further evaluation; MRI ordered. The vascular evaluation performed on 5/24 field non-compressible ABIs bilaterally and reduced TBI bilaterally suggesting significant tibial disease. He will be referred to vascular medicine for further evaluation. 12/13/17-He is here in follow-up evaluation for right great toe ulcer. He has appointment next Thursday for the MRI and vascular evaluation. Wound culture obtained last week grew abundant Klebsiella oxytoca (multidrug sensitivity), and abundant enteric coccus faecalis (sensitive to ampicillin). Ampicillin and Cipro were called in, along with a probiotic. In light of his appointments next Thursday he will follow-up in 2 weeks, we will continue with Santyl 12/27/17-He is here for evaluation for a right great toe ulcer. 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 He did have evaluation by vascular medicine, Dr. Ronalee Belts, on 6/13, at that appointment it was decided for him to undergo angiography with possible intervention but he refused and is still considering. If he chooses to have it done he will contact the office. He has not heard back from either ID offices that he was referred to two weeks ago:  [place] and [place]. there is improvement in both appearance and measurement to the ulcer. We will extend antibiotic therapy (amoxicillin 500 every 12, Cipro 500 daily) for additional 2 weeks pending infectious disease consult, he will continue with Santyl daily. He was reminded to continue with probiotic therapy while on oral antibiotics; he denies any GI disturbance. 01/03/18-He is here in follow-up evaluation for right great toe ulcer. He is decided to not undergo any vascular intervention at this time. He was established with [facility] infectious disease (Dr. Linus Salmons) last week initiated on vancomycin and cefazolin with dialysis treatment for 6 weeks. We will continue with current treatment plan with Santyl and offloading and he will follow-up in 2 weeks 01/17/18-He is seen in follow-up evaluation for right great toe ulcer. There is improvement in appearance with less nonviable tissue present. We will continue with same treatment plan he will follow-up next week. He is tolerating IV antibiotics without any complications 0/93/23-FT is seen in follow up evaluation for right great toe ulcer. There continues to be improvement. He is tolerating IV antibiotics with hemodialysis, completion date of 7/31. We will transition to collagen and and follow-up next week 02/07/18-He is here in follow up for a right great toe ulcer. There is red granulation tissue throughout the wound, improved in appearance. He completed antibiotics yesterday. He saw podiatry last Thursday for  nail trimming, at that appointment he periwound callus was trimmed and a culture was obtained; culture negative. He does not have a follow up appointment with infectious disease. We will submit for grafix. 02/14/18-He is here in follow-up evaluation for a right great toe ulcer. There is slow improvement, red granulation tissue throughout. The insurance approval for grafix is pending. We will continue with collagen and he'll follow-up next  week 02/21/18-He is seen in follow-up evaluation for right great toe ulcer. There is improvement with red granulation tissue throughout. He was approved for oasis and this was placed today. He will follow up next week 02/28/18-He is seen in follow-up evaluation for right great toe ulcer. The wound is stable, oasis applied and he'll follow-up next week 03/07/18-He is seen in follow-up violation of her right great toe ulcer. The wound is dry, no evidence of drainage. The wound appears healed today. He was painted with Betadine, instructed to paint with Betadine daily, cover with band-aid for the next week and then cover with band-aid for the following week continue with surgical shoe. He was encouraged to contact the clinic with any evidence of drainage, or change in appearance to toe/wound. He will be discharged from wound care services Readmission: 07/14/18 on evaluation today patient presents for initial evaluation our clinic concerning issues that he is having with his left great toe. We previously saw ABDELAZIZ, WESTENBERGER ([id number]) him in 2019 for his right great toe which was actually worse at that time and subsequently was able to be completely healed in the end although it did take several months. With that being said the patient is currently having an area on his left great toe that occurred initially as result of a blister that came up he's not really sure why or how. Subsequently this was initially treated by his podiatrist though the recommendation there was apparently amputation according to the patient and his daughter who was present with him today. With that being said the patient did not want to proceed with amputation and would like to try to perform wound care and get this to heal without having to go down that road. We were able to do that with his other toe and so he would like to at least give that a try before going forward with amputation. Again I completely understand his concern in  this regard. With that being said I did discuss with the patient obviously that it's always a chance that we cannot get this to heal with normal wound care measures but obviously we will give it our best shot. He does actually have an appointment scheduled for later today with the vascular specialist in order to evaluate his blood flow. That was at the recommendation of his podiatrist as well. Obviously if he is having good arterial flow and nonetheless is still experiencing issues with having the wound delay in healing then it Timothy Gibson simply be a matter of we need to initiate more aggressive wound to therapy in order to see if we can get this to heal. No fevers, chills, nausea, or vomiting noted at this time. The patient notes that this woman has been present for approximately three weeks. He is not a smoker. His most recent hemoglobin A1c was 6.2 on 06/18/19. 07/17/2019 on evaluation today patient appears unfortunately to not be doing as well. He did see vascular I did review the note as well. That was on 07/15/2019. Subsequently there can actually be taking him to the OR for an angiogram  due to poor arterial flow into this extremity. Apparently the atherosclerotic changes were severe. The patient is stated to be at risk for limb loss. Nonetheless the good news is this is being scheduled fairly quickly he will have the surgery/procedure next Thursday. With that being said I am going also go ahead based on the results of his x-ray which showed some chance of osteomyelitis in the distal portion of the toe get the patient set up for an MRI to further evaluate for the possibility of osteomyelitis obviously if he does have osteomyelitis we need to know so that we can address this appropriately. 07/29/2019 upon evaluation today patient appears to be doing better in general with regard to his toe ulcer. He does have much better blood flow you can actually feel a palpable pulse which appears to be very strong at this  time. I did review his arterial intervention/angiogram which did show that he had evidence of sufficient arterial flow restoration at this point. He did have occlusions that were 60 and 80% that residually were only 10 and 25% with no limiting flow. This is excellent news and hopefully he will continue to show signs of improvement in light of the improved vascular status. With that being said the toe mainly shows somewhat of an eschar today I think that this is fairly stable and as such probably would be best not to really do much in the way of debridement at this point I do believe Betadine would likely be a good option. 08/07/2019 on evaluation today patient actually appears to be doing about the same. The one difference is the eschar that we were just seeing if we can maintain is actually starting to loosen up and leak around the edges I think it is time to actually go ahead and remove this today. Is also no longer stable is very soft. Nonetheless the patient is okay with proceeding with debridement at this point. Fortunately there is no signs of active infection. No fevers, chills, nausea, vomiting, or diarrhea. The patient states that he took his last antibiotic that he had last night. He would need a refill as he will not be seeing infectious disease until February 2. Nonetheless obviously I do not want him to have any break in therapy especially when he is doing as well as he is currently. 08/14/2019 on evaluation today patient appears to be doing quite well all things considered in regard to his toe. I do feel like he is making progress and though this is not completely healed by any means we still have some ways to go I feel like he is making progress which is excellent. There is no sign of active infection at this time systemically. He does have osteomyelitis of the distal phalanx of his great toe on the left. He did see Dr. Linus Salmons. That is the infectious disease doctor in [place] where he was  referred. Dr. Linus Salmons apparently according to the note did not feel like the patient's MRI revealed osteomyelitis but just reactive marrow and subsequently did not recommend any antibiotic therapy whatsoever. To be peripherally honest I am not really in agreement with this based on the MRI results and what we are seeing and I think he has been responding at least decently well to oral medication as well. Nonetheless we Timothy Gibson want to check into the possibility of IV antibiotics being administered at the dialysis center. I will discuss this with Dr. Dellia Nims further. 08/21/2019 upon evaluation today patient appears at this  point to be making some progress with regard to his wounds. He has been tolerating the dressing changes without complication. Fortunately there is no evidence of active infection at this time. Overall I feel like he is doing well with the oral antibiotics I discussed with Dr. Dellia Nims the possibility of doing IV antibiotic therapy versus continuing with the oral. His opinion was if the patient is doing well with oral to maybe stick with that. Obviously we can initiate additional Cipro as well just to help ensure that there is nothing worsening here. The Cipro should be beneficial for him as well. That helps cover more gram-negative's were is at the doxycycline is more gram-positive's. 09/02/2019 on evaluation today patient appears to be doing fairly well upon inspection today. With that being said though the patient is continuing to do well he does also continue to develop issues with buildup of necrotic tissue on the distal portion of his toe he does have good arterial flow however. For that reason I am going to suggest that what we Timothy Gibson need to do is consider using something such as Santyl to try to keep this free and clear. Also discussed in greater detail HBO therapy with him today please see plan for additional recommendations and results of this discussion. 09/11/2019 upon evaluation today  patient appears to be doing well with regard to his toe ulcer. This actually appears to be much better today using the Santyl than it was previous. Fortunately there is no signs of active infection. No fevers, chills, nausea, vomiting, or diarrhea. 09/18/2019 upon evaluation today patient appears to be doing much better in regard to his wound in general. The Santyl was doing a good job at loosening stuff up here for [place] which is excellent news. With that being said he is still taking the oral antibiotic. I sent this in last for him on 09/03/2019. This was the doxycycline. When this runs out he will actually be complete as far as the treatment is concerned. With that being said I am getting continue the Cipro for 1 additional month. Overall his wound seems to be doing quite well. 09/25/2019 upon evaluation today patient appears to be doing well with regard to his toe ulcer. This is measuring much better and overall seems to be making good progress. Fortunately there is no signs of active infection at this time. No fevers, chills, nausea, vomiting, or diarrhea. 10/02/2019 upon evaluation today patient appears to be doing well with regard to his wound. He has been tolerating the dressing changes without complication. Fortunately there is no signs of active infection at this time. No fevers, chills, nausea, vomiting, or diarrhea. Overall I am extremely pleased with how things seem to be progressing. I think the wound bed is at the point now where we can likely proceed with additional measures to try to get this healed more quickly I think Dermagraft Timothy Gibson be a good possibility for the patient. We can definitely look into seeing about approval for this. 10/09/2019 upon evaluation today patient's wound actually appears to be doing well although it still is progressing somewhat slowly compared to what I would like to see. With that being said I did order Apligraf for the patient which I felt would keep the wound nice  and moist without allowing it to dry out Timothy Gibson, Timothy Gibson. ([id number]) and hopefully allow this to heal much more effectively and quickly. The patient actually did obtain approval and therefore we can apply that today. Objective Constitutional Well-nourished and  well-hydrated in no acute distress. Vitals Time Taken: 10:12 AM, Height: 76 in, Weight: 244 lbs, BMI: 29.7, Temperature: 97.7 F, Pulse: 96 bpm, Respiratory Rate: 16 breaths/min, Blood Pressure: 190/78 mmHg. General Notes: DT dialysis patient's BP fluctuates. Respiratory normal breathing without difficulty. Psychiatric this patient is able to make decisions and demonstrates good insight into disease process. Alert and Oriented x 3. pleasant and cooperative. General Notes: Patient's wound bed currently showed signs of some slough noted on the surface of the wound I did perform sharp debridement to clear away necrotic tissue at this point. Fortunately the patient tolerated this well there was some bleeding I had to use a little bit of silver nitrate to chemically cauterize one area to stop the bleeding. Other than this the patient did very well and then I did apply the Apligraf to the wound bed followed by securing this with Mepitel and then Steri-Strips after applying skin prep. I then subsequently use drawtex externally to hold this in place as a bolster behind. We will also obviously apply dry gauze dressing. Integumentary (Hair, Skin) Wound #3 status is Open. Original cause of wound was Gradually Appeared. The wound is located on the Left Toe Great. The wound measures 1.7cm length x 1cm width x 0.2cm depth; 1.335cm^2 area and 0.267cm^3 volume. There is Fat Layer (Subcutaneous Tissue) Exposed exposed. There is no tunneling or undermining noted. There is a medium amount of serous drainage noted. The wound margin is flat and intact. There is large (67-100%) pink granulation within the wound bed. There is a small (1-33%) amount of  necrotic tissue within the wound bed including Adherent Slough. 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 There was a Excisional Skin/Subcutaneous Tissue Debridement with a total area of 1.7 sq cm performed by [name], Haileigh Pitz E., PA-C. With the following instrument(s): Curette to remove Viable and Non-Viable tissue/material. Material removed includes Callus and Subcutaneous Tissue and after achieving pain control using Lidocaine. A time out was conducted at 10:23, prior to the start of the procedure. A Minimum amount of bleeding was controlled with Pressure. The procedure was tolerated well. Post Debridement Measurements: 1.7cm length x 1cm width x 0.2cm depth; 0.267cm^3 volume. Character of Wound/Ulcer Post Debridement is stable. Severity of Tissue Post Debridement is: Fat layer exposed. Post procedure Diagnosis Wound #3: Same as Pre-Procedure Timothy Gibson, Timothy Gibson ([id number]) Pre-procedure diagnosis of Wound #3 is a Diabetic Wound/Ulcer of the Lower Extremity located on the Left Toe Great. A skin graft procedure using a bioengineered skin substitute/cellular or tissue based product was performed by [name], Izacc Demeyer E., PA-C with the following instrument(s): Forceps and Scissors. Apligraf was applied and secured with Steri-Strips. 14 sq cm of product was utilized and 30 sq cm was wasted due to wound size. Post Application, mepitel one was applied. A Time Out was conducted at 10:30, prior to the start of the procedure. The procedure was tolerated well. Post procedure Diagnosis Wound #3: Same as  Pre-Procedure . Plan Wound Cleansing: Wound #3 Left Toe Great: Dial antibacterial soap, wash wounds, rinse and pat dry prior to dressing wounds Timothy Gibson Shower, gently pat wound dry prior to applying new dressing. Anesthetic (  add to Medication List): Wound #3 Left Toe Great: Topical Lidocaine 4% cream applied to wound bed prior to debridement (In Clinic Only). Primary Wound Dressing: Wound #3 Left Toe Great: Other: - Apligraft applied Secondary Dressing: Wound #3 Left Toe Great: Conform/Kerlix Drawtex Dressing Change Frequency: Wound #3 Left Toe Great: Other: - As needed Follow-up Appointments: Wound #3 Left Toe Great: Return Appointment in 2 weeks. Advanced Therapies: Wound #3 Left Toe Great: Apligraf application in clinic; including contact layer, fixation with steri strips, dry gauze and cover dressing. 1. My suggestion at this time is good be that we continue with the Apligraf treatments today as #1. We will see how things do over the next 2 weeks. I explained to the patient that typically this is a every 2-week treatment although hopefully will be able to keep the dressing in place that long toes are obviously a little bit more difficult to keep in place compared to the leg underneath the compression wrap. 2. We will continue to monitor for any signs of infection though I do not see anything obvious at this point and hopefully he will have no issues with the Apligraf. 3. I am also going to recommend that they change the dressing just as needed if anything becomes soiled or saturated obviously this Timothy Gibson not even need to be changed to be honest. We will see patient back for reevaluation in 2 weeks here in the clinic. If anything worsens or changes patient will contact our office for additional recommendations. Electronic Signature(s) Signed: 10/09/2019 11:20:07 AM By: Worthy Keeler PA-C Previous Signature: 10/09/2019 11:19:51 AM Version By: Worthy Keeler PA-C Entered By: Worthy Keeler on 10/09/2019 11:20:05 Timothy Gibson ([id number]) -------------------------------------------------------------------------------- SuperBill Details Patient Name: Timothy Gibson Date of Service: 10/09/2019 Medical Record Number: [id number] Patient Account Number: [id number] Date of Birth/Sex: [birth year] (68 y.o. M) Treating RN: Army Melia Primary Care Provider: Merrie Roof Other Clinician: Referring Provider: Merrie Roof Treating Provider/Extender: Melburn Hake, Seif Teichert Weeks in Treatment: 12 Diagnosis Coding ICD-10 Codes Code Description [phone] Other chronic osteomyelitis, left ankle and foot E11.621 Type 2 diabetes mellitus with foot ulcer L97.522 Non-pressure chronic ulcer of other part of left foot with fat layer exposed I10 Essential (primary) hypertension Facility Procedures CPT4 Code: 19758832 Description: [id number] (Facility Use Only) Apligraf 44 SQ CM Modifier: Quantity: Morris Code: [id number] Description: 40768 - SKIN SUB GRAFT FACE/NK/HF/G Modifier: Quantity: 1 CPT4 Code: Description: ICD-10 Diagnosis Description L97.522 Non-pressure chronic ulcer of other part of left foot with fat layer exposed Modifier: Quantity: Physician Procedures CPT4 Code: 0881103 Description: 15945 - WC PHYS SKIN SUB GRAFT FACE/NK/HF/G Modifier: Quantity: 1 CPT4 Code: Description: ICD-10 Diagnosis Description L97.522 Non-pressure chronic ulcer of other part of left foot with fat layer exposed Modifier: Quantity: Electronic Signature(s) Signed: 10/09/2019 11:22:34 AM By: Worthy Keeler PA-C Entered By: Worthy Keeler on 10/09/2019 11:22:33

## 2019-10-09 NOTE — Progress Notes (Addendum)
Timothy, Gibson ([id number]) Visit Report for 10/09/2019 Arrival Information Details Patient Name: Timothy, Gibson Date of Service: 10/09/2019 10:15 AM Medical Record Number: [id number] Patient Account Number: [id number] Date of Birth/Sex: 26-Aug-1950 (69 y.o. M) Treating RN: Timothy Gibson Primary Care Timothy Gibson: Timothy Gibson Other Clinician: Referring Timothy Gibson: Timothy Gibson Treating Timothy Gibson/Extender: Timothy Gibson, Timothy Gibson in Treatment: 12 Visit Information History Since Last Visit Added or deleted any medications: No Patient Arrived: Cane Has Dressing in Place as Prescribed: Yes Arrival Time: 10:11 Pain Present Now: No Accompanied By: daughter Transfer Assistance: None Patient Identification Verified: Yes Secondary Verification Process Completed: Yes Electronic Signature(s) Signed: 10/09/2019 5:42:19 PM By: Timothy Gibson, BSN, RN, CWS, Kim RN, BSN Entered By: Timothy Gibson, BSN, RN, CWS, Timothy Gibson on 10/09/2019 10:12:22 Timothy Gibson ([id number]) -------------------------------------------------------------------------------- Encounter Discharge Information Details Patient Name: Timothy Gibson Date of Service: 10/09/2019 10:15 AM Medical Record Number: [id number] Patient Account Number: [id number] Date of Birth/Sex: 11/03/1950 (69 y.o. M) Treating RN: Timothy Gibson Primary Care Andoni Busch: Timothy Gibson Other Clinician: Referring Izan Miron: Timothy Gibson Treating Timothy Gibson/Extender: Timothy Gibson, Timothy Gibson in Treatment: 12 Encounter Discharge Information Items Post Procedure Vitals Discharge Condition: Stable Temperature (F): 97.7 Ambulatory Status: Ambulatory Pulse (bpm): 96 Discharge Destination: Home Respiratory Rate (breaths/min): 16 Transportation: Private Auto Blood Pressure (mmHg): 190/70 Accompanied By: Timothy Gibson Schedule Follow-up Appointment: Yes Clinical Summary of Care: Electronic Signature(s) Signed: 10/09/2019 3:58:38 PM By: Timothy Gibson Entered By: Timothy Gibson on 10/09/2019  10:51:55 Timothy Gibson ([id number]) -------------------------------------------------------------------------------- Lower Extremity Assessment Details Patient Name: Timothy Gibson Date of Service: 10/09/2019 10:15 AM Medical Record Number: [id number] Patient Account Number: [id number] Date of Birth/Sex: 07/18/1950 (69 y.o. M) Treating RN: Timothy Gibson Primary Care Fredna Stricker: Timothy Gibson Other Clinician: Referring Sokha Craker: Timothy Gibson Treating Mikal Wisman/Extender: Timothy Gibson, Timothy Gibson in Treatment: 12 Edema Assessment Assessed: [Left: Yes] [Right: No] Edema: [Left: N] [Right: o] Vascular Assessment Pulses: Dorsalis Pedis Palpable: [Left:Yes] Electronic Signature(s) Signed: 10/09/2019 5:42:19 PM By: Timothy Gibson, BSN, RN, CWS, Kim RN, BSN Entered By: Timothy Gibson, BSN, RN, CWS, Timothy Gibson on 10/09/2019 10:19:03 Timothy Gibson ([id number]) -------------------------------------------------------------------------------- Multi Wound Chart Details Patient Name: Timothy Gibson Date of Service: 10/09/2019 10:15 AM Medical Record Number: [id number] Patient Account Number: [id number] Date of Birth/Sex: [birth year] (69 y.o. M) Treating RN: Timothy Gibson Primary Care Tonny Isensee: Timothy Gibson Other Clinician: Referring Kensley Lares: Timothy Gibson Treating Salene Mohamud/Extender: Timothy Gibson, Timothy Gibson in Treatment: 12 Vital Signs Height(in): 76 Pulse(bpm): 96 Weight(lbs): 244 Blood Pressure(mmHg): 190/78 Body Mass Index(BMI): 30 Temperature(F): 97.7 Respiratory Rate(breaths/min): 16 Photos: [N/A:N/A] Wound Location: Left Toe Great N/A N/A Wounding Event: Gradually Appeared N/A N/A Primary Etiology: Diabetic Wound/Ulcer of the Lower N/A N/A Extremity Comorbid History: Lymphedema, Arrhythmia, N/A N/A Hypertension, Type II Diabetes, End Stage Renal Disease, Rheumatoid Arthritis, Osteoarthritis, Confinement Anxiety Date Acquired: 06/19/2019 N/A N/A Gibson of Treatment: 12 N/A N/A Wound Status: Open N/A  N/A Measurements L x W x D (cm) 1.7x1x0.2 N/A N/A Area (cm) : 1.335 N/A N/A Volume (cm) : 0.267 N/A N/A % Reduction in Area: 50.70% N/A N/A % Reduction in Volume: 1.50% N/A N/A Classification: Grade 2 N/A N/A Exudate Amount: Medium N/A N/A Exudate Type: Serous N/A N/A Exudate Color: amber N/A N/A Wound Margin: Flat and Intact N/A N/A Granulation Amount: Large (67-100%) N/A N/A Granulation Quality: Pink N/A N/A Necrotic Amount: Small (1-33%) N/A N/A Exposed Structures: Fat Layer (Subcutaneous Tissue) N/A N/A Exposed: Yes Fascia: No Tendon: No Muscle: No Joint: No Bone: No Epithelialization: Small (1-33%) N/A N/A Treatment Notes Electronic Signature(s)  Timothy, Gibson ([id number]) Signed: 10/09/2019 3:58:38 PM By: Timothy Gibson Entered By: Timothy Gibson on 10/09/2019 10:24:31 Timothy, Gibson ([id number]) -------------------------------------------------------------------------------- [place] Details Patient Name: Timothy Gibson Date of Service: 10/09/2019 10:15 AM Medical Record Number: [id number] Patient Account Number: [id number] Date of Birth/Sex: March 30, 1951 (69 y.o. M) Treating RN: Timothy Gibson Primary Care Suhana Wilner: Timothy Gibson Other Clinician: Referring Arion Shankles: Timothy Gibson Treating Tryphena Perkovich/Extender: Timothy Gibson, Timothy Gibson in Treatment: 12 Active Inactive Abuse / Safety / Falls / Self Care Management Nursing Diagnoses: Potential for falls Goals: Patient will not experience any injury related to falls Date Initiated: 07/15/2019 Target Resolution Date: 10/18/2019 Goal Status: Active Interventions: Assess fall risk on admission and as needed Notes: Necrotic Tissue Nursing Diagnoses: Impaired tissue integrity related to necrotic/devitalized tissue Goals: Necrotic/devitalized tissue will be minimized in the wound bed Date Initiated: 07/15/2019 Target Resolution Date: 10/18/2019 Goal Status: Active Interventions: Provide education on  necrotic tissue and debridement process Notes: Orientation to the Wound Care Program Nursing Diagnoses: Knowledge deficit related to the wound healing center program Goals: Patient/caregiver will verbalize understanding of the [facility] Date Initiated: 07/15/2019 Target Resolution Date: 10/18/2019 Goal Status: Active Interventions: Provide education on orientation to the wound center Notes: Peripheral Neuropathy Nursing Diagnoses: Knowledge deficit related to disease process and management of peripheral neurovascular dysfunction SALMAN, WELLEN ([id number]) Goals: Patient/caregiver will verbalize understanding of disease process and disease management Date Initiated: 07/15/2019 Target Resolution Date: 10/18/2019 Goal Status: Active Interventions: Assess signs and symptoms of neuropathy upon admission and as needed Provide education on Management of Neuropathy and Related Ulcers Notes: Wound/Skin Impairment Nursing Diagnoses: Impaired tissue integrity Goals: Ulcer/skin breakdown will heal within 14 Gibson Date Initiated: 07/15/2019 Target Resolution Date: 10/18/2019 Goal Status: Active Interventions: Assess patient/caregiver ability to obtain necessary supplies Assess patient/caregiver ability to perform ulcer/skin care regimen upon admission and as needed Assess ulceration(s) every visit Notes: Electronic Signature(s) Signed: 10/09/2019 3:58:38 PM By: Timothy Gibson Entered By: Timothy Gibson on 10/09/2019 10:23:53 Timothy Gibson ([id number]) -------------------------------------------------------------------------------- Pain Assessment Details Patient Name: Timothy Gibson Date of Service: 10/09/2019 10:15 AM Medical Record Number: [id number] Patient Account Number: [id number] Date of Birth/Sex: 1950-07-30 (70 y.o. M) Treating RN: Timothy Gibson Primary Care Daylani Deblois: Timothy Gibson Other Clinician: Referring Sterling Ucci: Timothy Gibson Treating Cyncere Ruhe/Extender:  Timothy Gibson, Timothy Gibson in Treatment: 12 Active Problems Location of Pain Severity and Description of Pain Patient Has Paino No Site Locations Pain Management and Medication Current Pain Management: Notes Patient denies pain at this time. Electronic Signature(s) Signed: 10/09/2019 5:42:19 PM By: Timothy Gibson, BSN, RN, CWS, Kim RN, BSN Entered By: Timothy Gibson, BSN, RN, CWS, Timothy Gibson on 10/09/2019 10:16:45 Timothy Gibson ([id number]) -------------------------------------------------------------------------------- Patient/Caregiver Education Details Patient Name: Timothy Gibson Date of Service: 10/09/2019 10:15 AM Medical Record Number: [id number] Patient Account Number: [id number] Date of Birth/Gender: Jan 07, 1951 (69 y.o. M) Treating RN: Timothy Gibson Primary Care Physician: Timothy Gibson Other Clinician: Referring Physician: Merrie Gibson Treating Physician/Extender: Sharalyn Ink in Treatment: 12 Education Assessment Education Provided To: Patient Education Topics Provided Wound/Skin Impairment: Handouts: Caring for Your Ulcer Methods: Demonstration, Explain/Verbal Responses: State content correctly Electronic Signature(s) Signed: 10/09/2019 3:58:38 PM By: Timothy Gibson Entered By: Timothy Gibson on 10/09/2019 10:50:25 Timothy Gibson ([id number]) -------------------------------------------------------------------------------- Wound Assessment Details Patient Name: Timothy Gibson Date of Service: 10/09/2019 10:15 AM Medical Record Number: [id number] Patient Account Number: [id number] Date of Birth/Sex: December 20, 1950 (69 y.o. M) Treating RN: Timothy Gibson Primary Care Otie Headlee: Timothy Gibson Other Clinician: Referring Azrael Huss:  LANE, RACHEL Treating Jecenia Leamer/Extender: [name], Timothy Gibson in Treatment: 12 Wound Status Wound Number: 3 Primary Diabetic Wound/Ulcer of the Lower Extremity Etiology: Wound Location: Left Toe Great Wound Open Wounding Event: Gradually Appeared Status: Date  Acquired: 06/19/2019 Comorbid Lymphedema, Arrhythmia, Hypertension, Type II Diabetes, Gibson Of Treatment: 12 History: End Stage Renal Disease, Rheumatoid Arthritis, Clustered Wound: No Osteoarthritis, Confinement Anxiety Photos Wound Measurements Length: (cm) 1.7 % R Width: (cm) 1 % R Depth: (cm) 0.2 Epi Area: (cm) 1.335 Tu Volume: (cm) 0.267 Un eduction in Area: 50.7% eduction in Volume: 1.5% thelialization: Small (1-33%) nneling: No dermining: No Wound Description Classification: Grade 2 Fou Wound Margin: Flat and Intact Slo Exudate Amount: Medium Exudate Type: Serous Exudate Color: amber l Odor After Cleansing: No ugh/Fibrino Yes Wound Bed Granulation Amount: Large (67-100%) Exposed Structure Granulation Quality: Pink Fascia Exposed: No Necrotic Amount: Small (1-33%) Fat Layer (Subcutaneous Tissue) Exposed: Yes Necrotic Quality: Adherent Slough Tendon Exposed: No Muscle Exposed: No Joint Exposed: No Bone Exposed: No Treatment Notes Wound #3 (Left Toe Great) Notes apligraft applied, drawtex conform, offloading shoe Electronic Signature(s) CHIBUEZE, BEASLEY ([id number]) Signed: 10/09/2019 5:42:19 PM By: Timothy Gibson, BSN, RN, CWS, Kim RN, BSN Entered By: Timothy Gibson, BSN, RN, CWS, Timothy Gibson on 10/09/2019 10:18:03 Timothy Gibson ([id number]) -------------------------------------------------------------------------------- [place] Details Patient Name: Timothy Gibson Date of Service: 10/09/2019 10:15 AM Medical Record Number: [id number] Patient Account Number: [id number] Date of Birth/Sex: 19-May-1951 (69 y.o. M) Treating RN: Timothy Gibson Primary Care Marquette Piontek: Timothy Gibson Other Clinician: Referring Azreal Stthomas: Timothy Gibson Treating Garritt Molyneux/Extender: Timothy Gibson, Timothy Gibson in Treatment: 12 Vital Signs Time Taken: 10:12 Temperature (F): 97.7 Height (in): 76 Pulse (bpm): 96 Weight (lbs): 244 Respiratory Rate (breaths/min): 16 Body Mass Index (BMI): 29.7 Blood Pressure (mmHg):  190/78 Reference Range: 80 - 120 mg / dl Notes DT dialysis patient's BP fluctuates. Electronic Signature(s) Signed: 10/09/2019 5:42:19 PM By: Timothy Gibson, BSN, RN, CWS, Kim RN, BSN Entered By: Timothy Gibson, BSN, RN, CWS, Timothy Gibson on 10/09/2019 10:14:02

## 2019-10-10 DIAGNOSIS — Z992 Dependence on renal dialysis: Secondary | ICD-10-CM | POA: Diagnosis not present

## 2019-10-10 DIAGNOSIS — D631 Anemia in chronic kidney disease: Secondary | ICD-10-CM | POA: Diagnosis not present

## 2019-10-10 DIAGNOSIS — D509 Iron deficiency anemia, unspecified: Secondary | ICD-10-CM | POA: Diagnosis not present

## 2019-10-10 DIAGNOSIS — N2581 Secondary hyperparathyroidism of renal origin: Secondary | ICD-10-CM | POA: Diagnosis not present

## 2019-10-10 DIAGNOSIS — N186 End stage renal disease: Secondary | ICD-10-CM | POA: Diagnosis not present

## 2019-10-13 DIAGNOSIS — N2581 Secondary hyperparathyroidism of renal origin: Secondary | ICD-10-CM | POA: Diagnosis not present

## 2019-10-13 DIAGNOSIS — N186 End stage renal disease: Secondary | ICD-10-CM | POA: Diagnosis not present

## 2019-10-13 DIAGNOSIS — Z992 Dependence on renal dialysis: Secondary | ICD-10-CM | POA: Diagnosis not present

## 2019-10-13 DIAGNOSIS — D631 Anemia in chronic kidney disease: Secondary | ICD-10-CM | POA: Diagnosis not present

## 2019-10-13 DIAGNOSIS — D509 Iron deficiency anemia, unspecified: Secondary | ICD-10-CM | POA: Diagnosis not present

## 2019-10-15 DIAGNOSIS — D509 Iron deficiency anemia, unspecified: Secondary | ICD-10-CM | POA: Diagnosis not present

## 2019-10-15 DIAGNOSIS — Z992 Dependence on renal dialysis: Secondary | ICD-10-CM | POA: Diagnosis not present

## 2019-10-15 DIAGNOSIS — N186 End stage renal disease: Secondary | ICD-10-CM | POA: Diagnosis not present

## 2019-10-15 DIAGNOSIS — D631 Anemia in chronic kidney disease: Secondary | ICD-10-CM | POA: Diagnosis not present

## 2019-10-15 DIAGNOSIS — N2581 Secondary hyperparathyroidism of renal origin: Secondary | ICD-10-CM | POA: Diagnosis not present

## 2019-10-17 DIAGNOSIS — Z992 Dependence on renal dialysis: Secondary | ICD-10-CM | POA: Diagnosis not present

## 2019-10-17 DIAGNOSIS — D509 Iron deficiency anemia, unspecified: Secondary | ICD-10-CM | POA: Diagnosis not present

## 2019-10-17 DIAGNOSIS — N2581 Secondary hyperparathyroidism of renal origin: Secondary | ICD-10-CM | POA: Diagnosis not present

## 2019-10-17 DIAGNOSIS — N186 End stage renal disease: Secondary | ICD-10-CM | POA: Diagnosis not present

## 2019-10-17 DIAGNOSIS — D631 Anemia in chronic kidney disease: Secondary | ICD-10-CM | POA: Diagnosis not present

## 2019-10-20 DIAGNOSIS — N186 End stage renal disease: Secondary | ICD-10-CM | POA: Diagnosis not present

## 2019-10-20 DIAGNOSIS — D631 Anemia in chronic kidney disease: Secondary | ICD-10-CM | POA: Diagnosis not present

## 2019-10-20 DIAGNOSIS — E119 Type 2 diabetes mellitus without complications: Secondary | ICD-10-CM | POA: Diagnosis not present

## 2019-10-20 DIAGNOSIS — D509 Iron deficiency anemia, unspecified: Secondary | ICD-10-CM | POA: Diagnosis not present

## 2019-10-20 DIAGNOSIS — Z992 Dependence on renal dialysis: Secondary | ICD-10-CM | POA: Diagnosis not present

## 2019-10-20 DIAGNOSIS — N2581 Secondary hyperparathyroidism of renal origin: Secondary | ICD-10-CM | POA: Diagnosis not present

## 2019-10-20 LAB — HEMOGLOBIN A1C: Hemoglobin A1C: 6

## 2019-10-22 DIAGNOSIS — N2581 Secondary hyperparathyroidism of renal origin: Secondary | ICD-10-CM | POA: Diagnosis not present

## 2019-10-22 DIAGNOSIS — D509 Iron deficiency anemia, unspecified: Secondary | ICD-10-CM | POA: Diagnosis not present

## 2019-10-22 DIAGNOSIS — Z992 Dependence on renal dialysis: Secondary | ICD-10-CM | POA: Diagnosis not present

## 2019-10-22 DIAGNOSIS — N186 End stage renal disease: Secondary | ICD-10-CM | POA: Diagnosis not present

## 2019-10-22 DIAGNOSIS — D631 Anemia in chronic kidney disease: Secondary | ICD-10-CM | POA: Diagnosis not present

## 2019-10-23 ENCOUNTER — Encounter: Payer: Medicare Other | Admitting: Physician Assistant

## 2019-10-23 ENCOUNTER — Other Ambulatory Visit: Payer: Self-pay

## 2019-10-23 DIAGNOSIS — F17290 Nicotine dependence, other tobacco product, uncomplicated: Secondary | ICD-10-CM | POA: Diagnosis not present

## 2019-10-23 DIAGNOSIS — M86672 Other chronic osteomyelitis, left ankle and foot: Secondary | ICD-10-CM | POA: Diagnosis not present

## 2019-10-23 DIAGNOSIS — E1169 Type 2 diabetes mellitus with other specified complication: Secondary | ICD-10-CM | POA: Diagnosis not present

## 2019-10-23 DIAGNOSIS — I10 Essential (primary) hypertension: Secondary | ICD-10-CM | POA: Diagnosis not present

## 2019-10-23 DIAGNOSIS — E1122 Type 2 diabetes mellitus with diabetic chronic kidney disease: Secondary | ICD-10-CM | POA: Diagnosis not present

## 2019-10-23 DIAGNOSIS — E11621 Type 2 diabetes mellitus with foot ulcer: Secondary | ICD-10-CM | POA: Diagnosis not present

## 2019-10-23 DIAGNOSIS — L97522 Non-pressure chronic ulcer of other part of left foot with fat layer exposed: Secondary | ICD-10-CM | POA: Diagnosis not present

## 2019-10-23 NOTE — Progress Notes (Addendum)
DEUNTE, BLEDSOE ([id number]) Visit Report for 10/23/2019 Chief Complaint Document Details Patient Name: Timothy Gibson, Timothy Gibson Date of Service: 10/23/2019 10:15 AM Medical Record Number: [id number] Patient Account Number: [id number] Date of Birth/Sex: 1950/09/25 (69 y.o. M) Treating RN: Army Melia Primary Care Provider: Merrie Roof Other Clinician: Referring Provider: Merrie Roof Treating Provider/Extender: Melburn Hake, Lexine Jaspers Weeks in Treatment: 14 Information Obtained from: Patient Chief Complaint Left Great Toe Electronic Signature(s) Signed: 10/23/2019 10:30:39 AM By: Worthy Keeler PA-C Entered By: Worthy Keeler on 10/23/2019 10:30:38 Timothy Gibson ([id number]) -------------------------------------------------------------------------------- HPI Details Patient Name: Timothy Gibson Date of Service: 10/23/2019 10:15 AM Medical Record Number: [id number] Patient Account Number: [id number] Date of Birth/Sex: 30-Apr-1951 (68 y.o. M) Treating RN: Army Melia Primary Care Provider: Merrie Roof Other Clinician: Referring Provider: Merrie Roof Treating Provider/Extender: Melburn Hake, Musab Wingard Weeks in Treatment: 14 History of Present Illness HPI Description: 11/27/17 on evaluation today patient presents for initial evaluation concerning an issue that he has been having with his right great toe which began last Thursday 11/22/17. He has a history of diabetes mellitus type to which he has had for greater than 30 years currently he is on insulin. Subsequently he also has a history of hypertension. On physical exam inspection is also appears he may have some peripheral vascular disease with his ABI's being noncompressible bilaterally. He is on dialysis as well and this is on Monday, Wednesday, and Friday. Patient was hospitalized back in January/February 2019 although this was more for stomach/back pain though they never told me exactly what was going on. This is according to the patient.  Subsequently the ulcer which is between the third and fourth toe when space of the right foot as well is on the plantar aspect of the fourth toe is due to him having cleaned his toes this morning and he states that his finger which is large split the toe tissue in between causing the wounds that we currently see. With that being said he states this has happened before I explained I would definitely recommend that he not do this any longer. He can use a small soft washcloth to clean between the toes without causing trauma. Subsequently the right great toe hatchery does show evidence of necrotic tissue present on the surface of the wound specifically there is callous and dead skin surrounding which is trapping fluid causing problems as far as the wound is concerned. The surface of the wound does show slough although due to his vascular flow I'm not going to sharply debride this today I think I will selectively debride away the necrotic skin as well is callous from around the surrounding so this will not continue to be a moisture issue. Nonetheless the patient has no pain he does have diabetic neuropathy. 12/06/17-He is here in follow-up evaluation for a right great toe ulcer. He is voicing no complaints or concerns. He continues to infrequently/socially smokes cigars with no desire to quit. He has been compliant with offloading, using open toed surgical shoe; has been compliant using Santyl daily. He continues on clindamycin although takes it inconsistently secondary to indigestion. The plain film x-ray performed on 5/23 impression: Soft tissue swelling of the left first toe and is noted with probable irregular lucency involving distal tuft of first distal phalanx concerning for osteomyelitis. MRI may be performed for further evaluation; MRI ordered. The vascular evaluation performed on 5/24 field non-compressible ABIs bilaterally and reduced TBI bilaterally suggesting significant tibial disease. He will  be referred to vascular  medicine for further evaluation. 12/13/17-He is here in follow-up evaluation for right great toe ulcer. He has appointment next Thursday for the MRI and vascular evaluation. Wound culture obtained last week grew abundant Klebsiella oxytoca (multidrug sensitivity), and abundant enteric coccus faecalis (sensitive to ampicillin). Ampicillin and Cipro were called in, along with a probiotic. In light of his appointments next Thursday he will follow-up in 2 weeks, we will continue with Santyl 12/27/17-He is here for evaluation for a right great toe ulcer. 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 He did have evaluation by vascular medicine, Dr. Ronalee Belts, on 6/13, at that appointment it was decided for him to undergo angiography with possible intervention but he refused and is still considering. If he chooses to have it done he will contact the office. He has not heard back from either ID offices that he was referred to two weeks ago: [place] and [place]. there is improvement in both appearance and measurement to the ulcer. We will extend antibiotic therapy (amoxicillin 500 every 12, Cipro 500 daily) for additional 2 weeks pending infectious disease consult, he will continue with Santyl daily. He was reminded to continue with probiotic therapy while on oral antibiotics; he denies any GI disturbance. 01/03/18-He is here in follow-up evaluation for right great toe ulcer. He is decided to not undergo any vascular intervention at this time. He was established with [facility] infectious disease (Dr. Linus Salmons) last week initiated on vancomycin and cefazolin with dialysis treatment for 6 weeks. We will continue with current treatment plan with Santyl and offloading and he will follow-up in 2 weeks 01/17/18-He is seen in follow-up evaluation for right great toe ulcer. There is improvement in appearance with less nonviable  tissue present. We will continue with same treatment plan he will follow-up next week. He is tolerating IV antibiotics without any complications 09/08/58-FU is seen in follow up evaluation for right great toe ulcer. There continues to be improvement. He is tolerating IV antibiotics with hemodialysis, completion date of 7/31. We will transition to collagen and and follow-up next week 02/07/18-He is here in follow up for a right great toe ulcer. There is red granulation tissue throughout the wound, improved in appearance. He completed antibiotics yesterday. He saw podiatry last Thursday for nail trimming, at that appointment he periwound callus was trimmed and a culture was obtained; culture negative. He does not have a follow up appointment with infectious disease. We will submit for grafix. 02/14/18-He is here in follow-up evaluation for a right great toe ulcer. There is slow improvement, red granulation tissue throughout. The insurance approval for grafix is pending. We will continue with collagen and he'll follow-up next week 02/21/18-He is seen in follow-up evaluation for right great toe ulcer. There is improvement with red granulation tissue throughout. He was approved for oasis and this was placed today. He will follow up next week 02/28/18-He is seen in follow-up evaluation for right great toe ulcer. The wound is stable, oasis applied and he'll follow-up next week 03/07/18-He is seen in follow-up violation of her right great toe ulcer. The wound is dry, no evidence of drainage. The wound appears healed today. He was painted with Betadine, instructed to paint with Betadine daily, cover with band-aid for the next week and then cover with band-aid for the following week continue with surgical shoe. He was encouraged to contact the clinic with any evidence of drainage, or change in appearance to toe/wound. He  will be discharged from wound care services Readmission: 07/14/18 on evaluation today patient  presents for initial evaluation our clinic concerning issues that he is having with his left great toe. We previously saw him in 2019 for his right great toe which was actually worse at that time and subsequently was able to be completely healed in the end although it did take several months. With that being said the patient is currently having an area on his left great toe that occurred initially as result of a blister that came up he's not really sure why or how. Subsequently this was initially treated by his podiatrist though the recommendation there was apparently amputation according to the patient and his daughter who was present with him today. With that being said the patient did not want to proceed with amputation and would like to try to perform wound care and get this to heal without having to go down that road. We were able to do that with his other PADEN, SENGER. ([id number]) toe and so he would like to at least give that a try before going forward with amputation. Again I completely understand his concern in this regard. With that being said I did discuss with the patient obviously that it's always a chance that we cannot get this to heal with normal wound care measures but obviously we will give it our best shot. He does actually have an appointment scheduled for later today with the vascular specialist in order to evaluate his blood flow. That was at the recommendation of his podiatrist as well. Obviously if he is having good arterial flow and nonetheless is still experiencing issues with having the wound delay in healing then it may simply be a matter of we need to initiate more aggressive wound to therapy in order to see if we can get this to heal. No fevers, chills, nausea, or vomiting noted at this time. The patient notes that this woman has been present for approximately three weeks. He is not a smoker. His most recent hemoglobin A1c was 6.2 on 06/18/19. 07/17/2019 on evaluation  today patient appears unfortunately to not be doing as well. He did see vascular I did review the note as well. That was on 07/15/2019. Subsequently there can actually be taking him to the OR for an angiogram due to poor arterial flow into this extremity. Apparently the atherosclerotic changes were severe. The patient is stated to be at risk for limb loss. Nonetheless the good news is this is being scheduled fairly quickly he will have the surgery/procedure next Thursday. With that being said I am going also go ahead based on the results of his x-ray which showed some chance of osteomyelitis in the distal portion of the toe get the patient set up for an MRI to further evaluate for the possibility of osteomyelitis obviously if he does have osteomyelitis we need to know so that we can address this appropriately. 07/29/2019 upon evaluation today patient appears to be doing better in general with regard to his toe ulcer. He does have much better blood flow you can actually feel a palpable pulse which appears to be very strong at this time. I did review his arterial intervention/angiogram which did show that he had evidence of sufficient arterial flow restoration at this point. He did have occlusions that were 60 and 80% that residually were only 10 and 25% with no limiting flow. This is excellent news and hopefully he will continue to show signs of improvement  in light of the improved vascular status. With that being said the toe mainly shows somewhat of an eschar today I think that this is fairly stable and as such probably would be best not to really do much in the way of debridement at this point I do believe Betadine would likely be a good option. 08/07/2019 on evaluation today patient actually appears to be doing about the same. The one difference is the eschar that we were just seeing if we can maintain is actually starting to loosen up and leak around the edges I think it is time to actually go ahead and  remove this today. Is also no longer stable is very soft. Nonetheless the patient is okay with proceeding with debridement at this point. Fortunately there is no signs of active infection. No fevers, chills, nausea, vomiting, or diarrhea. The patient states that he took his last antibiotic that he had last night. He would need a refill as he will not be seeing infectious disease until February 2. Nonetheless obviously I do not want him to have any break in therapy especially when he is doing as well as he is currently. 08/14/2019 on evaluation today patient appears to be doing quite well all things considered in regard to his toe. I do feel like he is making progress and though this is not completely healed by any means we still have some ways to go I feel like he is making progress which is excellent. There is no sign of active infection at this time systemically. He does have osteomyelitis of the distal phalanx of his great toe on the left. He did see Dr. Linus Salmons. That is the infectious disease doctor in [place] where he was referred. Dr. Linus Salmons apparently according to the note did not feel like the patient's MRI revealed osteomyelitis but just reactive marrow and subsequently did not recommend any antibiotic therapy whatsoever. To be peripherally honest I am not really in agreement with this based on the MRI results and what we are seeing and I think he has been responding at least decently well to oral medication as well. Nonetheless we may want to check into the possibility of IV antibiotics being administered at the dialysis center. I will discuss this with Dr. Dellia Nims further. 08/21/2019 upon evaluation today patient appears at this point to be making some progress with regard to his wounds. He has been tolerating the dressing changes without complication. Fortunately there is no evidence of active infection at this time. Overall I feel like he is doing well with the oral antibiotics I discussed  with Dr. Dellia Nims the possibility of doing IV antibiotic therapy versus continuing with the oral. His opinion was if the patient is doing well with oral to maybe stick with that. Obviously we can initiate additional Cipro as well just to help ensure that there is nothing worsening here. The Cipro should be beneficial for him as well. That helps cover more gram-negative's were is at the doxycycline is more gram-positive's. 09/02/2019 on evaluation today patient appears to be doing fairly well upon inspection today. With that being said though the patient is continuing to do well he does also continue to develop issues with buildup of necrotic tissue on the distal portion of his toe he does have good arterial flow however. For that reason I am going to suggest that what we may need to do is consider using something such as Santyl to try to keep this free and clear. Also discussed in greater  detail HBO therapy with him today please see plan for additional recommendations and results of this discussion. 09/11/2019 upon evaluation today patient appears to be doing well with regard to his toe ulcer. This actually appears to be much better today using the Santyl than it was previous. Fortunately there is no signs of active infection. No fevers, chills, nausea, vomiting, or diarrhea. 09/18/2019 upon evaluation today patient appears to be doing much better in regard to his wound in general. The Santyl was doing a good job at loosening stuff up here for [place] which is excellent news. With that being said he is still taking the oral antibiotic. I sent this in last for him on 09/03/2019. This was the doxycycline. When this runs out he will actually be complete as far as the treatment is concerned. With that being said I am getting continue the Cipro for 1 additional month. Overall his wound seems to be doing quite well. 09/25/2019 upon evaluation today patient appears to be doing well with regard to his toe ulcer. This is  measuring much better and overall seems to be making good progress. Fortunately there is no signs of active infection at this time. No fevers, chills, nausea, vomiting, or diarrhea. 10/02/2019 upon evaluation today patient appears to be doing well with regard to his wound. He has been tolerating the dressing changes without complication. Fortunately there is no signs of active infection at this time. No fevers, chills, nausea, vomiting, or diarrhea. Overall I am extremely pleased with how things seem to be progressing. I think the wound bed is at the point now where we can likely proceed with additional measures to try to get this healed more quickly I think Dermagraft may be a good possibility for the patient. We can definitely look into seeing about approval for this. 10/09/2019 upon evaluation today patient's wound actually appears to be doing well although it still is progressing somewhat slowly compared to what I would like to see. With that being said I did order Apligraf for the patient which I felt would keep the wound nice and moist without allowing it to dry out and hopefully allow this to heal much more effectively and quickly. The patient actually did obtain approval and therefore we can apply that today. 10/23/2019 upon evaluation today patient's wound actually appears to be doing significantly better with regard to his toe ulcer. The tissue at the base of the wound is greatly improved and overall I am very pleased with the progress made. We did use Apligraf 2 weeks ago on the toe that did excellent for him much it has completely dissolved after as of the 2 weeks. They only had to change the dressing a few times due to drainage through. Obviously Timothy Gibson, Timothy Gibson ([id number]) they left the Steri-Strips and Mepitel in place. Unfortunately we do not have the Apligraf available today for application regard therefore have to use a different product here in the clinic today and then order the  Apligraf for next Thursday. That will be his second application. Electronic Signature(s) Signed: 10/23/2019 5:28:28 PM By: Worthy Keeler PA-C Entered By: Worthy Keeler on 10/23/2019 17:28:27 Timothy Gibson, Timothy Gibson ([id number]) -------------------------------------------------------------------------------- Physical Exam Details Patient Name: Timothy Gibson Date of Service: 10/23/2019 10:15 AM Medical Record Number: [id number] Patient Account Number: [id number] Date of Birth/Sex: 1951-06-09 (68 y.o. M) Treating RN: Army Melia Primary Care Provider: Merrie Roof Other Clinician: Referring Provider: Merrie Roof Treating Provider/Extender: [name], Shakora Nordquist Weeks in Treatment: 82 Constitutional Well-nourished  and well-hydrated in no acute distress. Respiratory normal breathing without difficulty. Psychiatric this patient is able to make decisions and demonstrates good insight into disease process. Alert and Oriented x 3. pleasant and cooperative. Notes Upon inspection patient's wound bed again did not require any sharp debridement today which was great news in fact this one of the first times this has been the case. He seemed to be doing quite well I was able to mechanically wipe off the slough and debris without any sharp debridement necessary. Overall the wound bed appears to be doing excellent and is much healthier. Electronic Signature(s) Signed: 10/23/2019 5:28:49 PM By: Worthy Keeler PA-C Entered By: Worthy Keeler on 10/23/2019 17:28:49 Timothy Gibson, Timothy Gibson ([id number]) -------------------------------------------------------------------------------- Physician Orders Details Patient Name: Timothy Gibson Date of Service: 10/23/2019 10:15 AM Medical Record Number: [id number] Patient Account Number: [id number] Date of Birth/Sex: 1950/10/04 (68 y.o. M) Treating RN: Army Melia Primary Care Provider: Merrie Roof Other Clinician: Referring Provider: Merrie Roof Treating  Provider/Extender: Melburn Hake, Dawnelle Warman Weeks in Treatment: 45 Verbal / Phone Orders: No Diagnosis Coding ICD-10 Coding Code Description [phone] Other chronic osteomyelitis, left ankle and foot E11.621 Type 2 diabetes mellitus with foot ulcer L97.522 Non-pressure chronic ulcer of other part of left foot with fat layer exposed I10 Essential (primary) hypertension Wound Cleansing Wound #3 Left Toe Great o Dial antibacterial soap, wash wounds, rinse and pat dry prior to dressing wounds o May Shower, gently pat wound dry prior to applying new dressing. Anesthetic (add to Medication List) Wound #3 Left Toe Great o Topical Lidocaine 4% cream applied to wound bed prior to debridement (In Clinic Only). Primary Wound Dressing Wound #3 Left Toe Great o Other: - endoform Secondary Dressing Wound #3 Left Toe Great o Conform/Kerlix o Drawtex Dressing Change Frequency Wound #3 Left Toe Great o Other: - As needed Follow-up Appointments Wound #3 Left Toe Great o Return Appointment in 1 week. Electronic Signature(s) Signed: 10/23/2019 3:28:23 PM By: Army Melia Signed: 10/23/2019 5:42:16 PM By: Worthy Keeler PA-C Entered By: Army Melia on 10/23/2019 10:35:38 Timothy Gibson, Timothy Gibson ([id number]) -------------------------------------------------------------------------------- Problem List Details Patient Name: Timothy Gibson Date of Service: 10/23/2019 10:15 AM Medical Record Number: [id number] Patient Account Number: [id number] Date of Birth/Sex: [birth year] (69 y.o. M) Treating RN: Army Melia Primary Care Provider: Merrie Roof Other Clinician: Referring Provider: Merrie Roof Treating Provider/Extender: Melburn Hake, Darbie Biancardi Weeks in Treatment: 14 Active Problems ICD-10 Evaluated Encounter Code Description Active Date Today Diagnosis [phone] Other chronic osteomyelitis, left ankle and foot 07/29/2019 No Yes E11.621 Type 2 diabetes mellitus with foot ulcer 07/15/2019 No  Yes L97.522 Non-pressure chronic ulcer of other part of left foot with fat layer exposed 07/15/2019 No Yes I10 Essential (primary) hypertension 07/15/2019 No Yes Inactive Problems Resolved Problems Electronic Signature(s) Signed: 10/23/2019 10:30:23 AM By: Worthy Keeler PA-C Entered By: Worthy Keeler on 10/23/2019 10:30:22 Timothy Gibson ([id number]) -------------------------------------------------------------------------------- Progress Note Details Patient Name: Timothy Gibson Date of Service: 10/23/2019 10:15 AM Medical Record Number: [id number] Patient Account Number: [id number] Date of Birth/Sex: 1951/06/04 (68 y.o. M) Treating RN: Army Melia Primary Care Provider: Merrie Roof Other Clinician: Referring Provider: Merrie Roof Treating Provider/Extender: Melburn Hake, Azreal Stthomas Weeks in Treatment: 14 Subjective Chief Complaint Information obtained from Patient Left Great Toe History of Present Illness (HPI) 11/27/17 on evaluation today patient presents for initial evaluation concerning an issue that he has been having with his right great toe which began last Thursday 11/22/17. He has a history of  diabetes mellitus type to which he has had for greater than 30 years currently he is on insulin. Subsequently he also has a history of hypertension. On physical exam inspection is also appears he may have some peripheral vascular disease with his ABI's being noncompressible bilaterally. He is on dialysis as well and this is on Monday, Wednesday, and Friday. Patient was hospitalized back in January/February 2019 although this was more for stomach/back pain though they never told me exactly what was going on. This is according to the patient. Subsequently the ulcer which is between the third and fourth toe when space of the right foot as well is on the plantar aspect of the fourth toe is due to him having cleaned his toes this morning and he states that his finger which is large split the toe  tissue in between causing the wounds that we currently see. With that being said he states this has happened before I explained I would definitely recommend that he not do this any longer. He can use a small soft washcloth to clean between the toes without causing trauma. Subsequently the right great toe hatchery does show evidence of necrotic tissue present on the surface of the wound specifically there is callous and dead skin surrounding which is trapping fluid causing problems as far as the wound is concerned. The surface of the wound does show slough although due to his vascular flow I'm not going to sharply debride this today I think I will selectively debride away the necrotic skin as well is callous from around the surrounding so this will not continue to be a moisture issue. Nonetheless the patient has no pain he does have diabetic neuropathy. 12/06/17-He is here in follow-up evaluation for a right great toe ulcer. He is voicing no complaints or concerns. He continues to infrequently/socially smokes cigars with no desire to quit. He has been compliant with offloading, using open toed surgical shoe; has been compliant using Santyl daily. He continues on clindamycin although takes it inconsistently secondary to indigestion. The plain film x-ray performed on 5/23 impression: Soft tissue swelling of the left first toe and is noted with probable irregular lucency involving distal tuft of first distal phalanx concerning for osteomyelitis. MRI may be performed for further evaluation; MRI ordered. The vascular evaluation performed on 5/24 field non-compressible ABIs bilaterally and reduced TBI bilaterally suggesting significant tibial disease. He will be referred to vascular medicine for further evaluation. 12/13/17-He is here in follow-up evaluation for right great toe ulcer. He has appointment next Thursday for the MRI and vascular evaluation. Wound culture obtained last week grew abundant Klebsiella  oxytoca (multidrug sensitivity), and abundant enteric coccus faecalis (sensitive to ampicillin). Ampicillin and Cipro were called in, along with a probiotic. In light of his appointments next Thursday he will follow-up in 2 weeks, we will continue with Santyl 12/27/17-He is here for evaluation for a right great toe ulcer. 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 He did have evaluation by vascular medicine, Dr. Ronalee Belts, on 6/13, at that appointment it was decided for him to undergo angiography with possible intervention but he refused and is still considering. If he chooses to have it done he will contact the office. He has not heard back from either ID offices that he was referred to two weeks ago: [place] and [place]. there is improvement in both appearance and measurement to the ulcer. We will extend antibiotic therapy (amoxicillin  500 every 12, Cipro 500 daily) for additional 2 weeks pending infectious disease consult, he will continue with Santyl daily. He was reminded to continue with probiotic therapy while on oral antibiotics; he denies any GI disturbance. 01/03/18-He is here in follow-up evaluation for right great toe ulcer. He is decided to not undergo any vascular intervention at this time. He was established with [facility] infectious disease (Dr. Linus Salmons) last week initiated on vancomycin and cefazolin with dialysis treatment for 6 weeks. We will continue with current treatment plan with Santyl and offloading and he will follow-up in 2 weeks 01/17/18-He is seen in follow-up evaluation for right great toe ulcer. There is improvement in appearance with less nonviable tissue present. We will continue with same treatment plan he will follow-up next week. He is tolerating IV antibiotics without any complications 8/54/62-VO is seen in follow up evaluation for right great toe ulcer. There continues to be improvement. He  is tolerating IV antibiotics with hemodialysis, completion date of 7/31. We will transition to collagen and and follow-up next week 02/07/18-He is here in follow up for a right great toe ulcer. There is red granulation tissue throughout the wound, improved in appearance. He completed antibiotics yesterday. He saw podiatry last Thursday for nail trimming, at that appointment he periwound callus was trimmed and a culture was obtained; culture negative. He does not have a follow up appointment with infectious disease. We will submit for grafix. 02/14/18-He is here in follow-up evaluation for a right great toe ulcer. There is slow improvement, red granulation tissue throughout. The insurance approval for grafix is pending. We will continue with collagen and he'll follow-up next week 02/21/18-He is seen in follow-up evaluation for right great toe ulcer. There is improvement with red granulation tissue throughout. He was approved for oasis and this was placed today. He will follow up next week 02/28/18-He is seen in follow-up evaluation for right great toe ulcer. The wound is stable, oasis applied and he'll follow-up next week 03/07/18-He is seen in follow-up violation of her right great toe ulcer. The wound is dry, no evidence of drainage. The wound appears healed today. He was painted with Betadine, instructed to paint with Betadine daily, cover with band-aid for the next week and then cover with band-aid for the following week continue with surgical shoe. He was encouraged to contact the clinic with any evidence of drainage, or change in appearance to toe/wound. He will be discharged from wound care services Readmission: 07/14/18 on evaluation today patient presents for initial evaluation our clinic concerning issues that he is having with his left great toe. We previously saw Timothy Gibson, Timothy Gibson ([id number]) him in 2019 for his right great toe which was actually worse at that time and subsequently was able to be  completely healed in the end although it did take several months. With that being said the patient is currently having an area on his left great toe that occurred initially as result of a blister that came up he's not really sure why or how. Subsequently this was initially treated by his podiatrist though the recommendation there was apparently amputation according to the patient and his daughter who was present with him today. With that being said the patient did not want to proceed with amputation and would like to try to perform wound care and get this to heal without having to go down that road. We were able to do that with his other toe and so he would like to at least give  that a try before going forward with amputation. Again I completely understand his concern in this regard. With that being said I did discuss with the patient obviously that it's always a chance that we cannot get this to heal with normal wound care measures but obviously we will give it our best shot. He does actually have an appointment scheduled for later today with the vascular specialist in order to evaluate his blood flow. That was at the recommendation of his podiatrist as well. Obviously if he is having good arterial flow and nonetheless is still experiencing issues with having the wound delay in healing then it may simply be a matter of we need to initiate more aggressive wound to therapy in order to see if we can get this to heal. No fevers, chills, nausea, or vomiting noted at this time. The patient notes that this woman has been present for approximately three weeks. He is not a smoker. His most recent hemoglobin A1c was 6.2 on 06/18/19. 07/17/2019 on evaluation today patient appears unfortunately to not be doing as well. He did see vascular I did review the note as well. That was on 07/15/2019. Subsequently there can actually be taking him to the OR for an angiogram due to poor arterial flow into this extremity.  Apparently the atherosclerotic changes were severe. The patient is stated to be at risk for limb loss. Nonetheless the good news is this is being scheduled fairly quickly he will have the surgery/procedure next Thursday. With that being said I am going also go ahead based on the results of his x-ray which showed some chance of osteomyelitis in the distal portion of the toe get the patient set up for an MRI to further evaluate for the possibility of osteomyelitis obviously if he does have osteomyelitis we need to know so that we can address this appropriately. 07/29/2019 upon evaluation today patient appears to be doing better in general with regard to his toe ulcer. He does have much better blood flow you can actually feel a palpable pulse which appears to be very strong at this time. I did review his arterial intervention/angiogram which did show that he had evidence of sufficient arterial flow restoration at this point. He did have occlusions that were 60 and 80% that residually were only 10 and 25% with no limiting flow. This is excellent news and hopefully he will continue to show signs of improvement in light of the improved vascular status. With that being said the toe mainly shows somewhat of an eschar today I think that this is fairly stable and as such probably would be best not to really do much in the way of debridement at this point I do believe Betadine would likely be a good option. 08/07/2019 on evaluation today patient actually appears to be doing about the same. The one difference is the eschar that we were just seeing if we can maintain is actually starting to loosen up and leak around the edges I think it is time to actually go ahead and remove this today. Is also no longer stable is very soft. Nonetheless the patient is okay with proceeding with debridement at this point. Fortunately there is no signs of active infection. No fevers, chills, nausea, vomiting, or diarrhea. The patient  states that he took his last antibiotic that he had last night. He would need a refill as he will not be seeing infectious disease until February 2. Nonetheless obviously I do not want him to have any  break in therapy especially when he is doing as well as he is currently. 08/14/2019 on evaluation today patient appears to be doing quite well all things considered in regard to his toe. I do feel like he is making progress and though this is not completely healed by any means we still have some ways to go I feel like he is making progress which is excellent. There is no sign of active infection at this time systemically. He does have osteomyelitis of the distal phalanx of his great toe on the left. He did see Dr. Linus Salmons. That is the infectious disease doctor in [place] where he was referred. Dr. Linus Salmons apparently according to the note did not feel like the patient's MRI revealed osteomyelitis but just reactive marrow and subsequently did not recommend any antibiotic therapy whatsoever. To be peripherally honest I am not really in agreement with this based on the MRI results and what we are seeing and I think he has been responding at least decently well to oral medication as well. Nonetheless we may want to check into the possibility of IV antibiotics being administered at the dialysis center. I will discuss this with Dr. Dellia Nims further. 08/21/2019 upon evaluation today patient appears at this point to be making some progress with regard to his wounds. He has been tolerating the dressing changes without complication. Fortunately there is no evidence of active infection at this time. Overall I feel like he is doing well with the oral antibiotics I discussed with Dr. Dellia Nims the possibility of doing IV antibiotic therapy versus continuing with the oral. His opinion was if the patient is doing well with oral to maybe stick with that. Obviously we can initiate additional Cipro as well just to help ensure that  there is nothing worsening here. The Cipro should be beneficial for him as well. That helps cover more gram-negative's were is at the doxycycline is more gram-positive's. 09/02/2019 on evaluation today patient appears to be doing fairly well upon inspection today. With that being said though the patient is continuing to do well he does also continue to develop issues with buildup of necrotic tissue on the distal portion of his toe he does have good arterial flow however. For that reason I am going to suggest that what we may need to do is consider using something such as Santyl to try to keep this free and clear. Also discussed in greater detail HBO therapy with him today please see plan for additional recommendations and results of this discussion. 09/11/2019 upon evaluation today patient appears to be doing well with regard to his toe ulcer. This actually appears to be much better today using the Santyl than it was previous. Fortunately there is no signs of active infection. No fevers, chills, nausea, vomiting, or diarrhea. 09/18/2019 upon evaluation today patient appears to be doing much better in regard to his wound in general. The Santyl was doing a good job at loosening stuff up here for [place] which is excellent news. With that being said he is still taking the oral antibiotic. I sent this in last for him on 09/03/2019. This was the doxycycline. When this runs out he will actually be complete as far as the treatment is concerned. With that being said I am getting continue the Cipro for 1 additional month. Overall his wound seems to be doing quite well. 09/25/2019 upon evaluation today patient appears to be doing well with regard to his toe ulcer. This is measuring much better and overall  seems to be making good progress. Fortunately there is no signs of active infection at this time. No fevers, chills, nausea, vomiting, or diarrhea. 10/02/2019 upon evaluation today patient appears to be doing well with  regard to his wound. He has been tolerating the dressing changes without complication. Fortunately there is no signs of active infection at this time. No fevers, chills, nausea, vomiting, or diarrhea. Overall I am extremely pleased with how things seem to be progressing. I think the wound bed is at the point now where we can likely proceed with additional measures to try to get this healed more quickly I think Dermagraft may be a good possibility for the patient. We can definitely look into seeing about approval for this. 10/09/2019 upon evaluation today patient's wound actually appears to be doing well although it still is progressing somewhat slowly compared to what I would like to see. With that being said I did order Apligraf for the patient which I felt would keep the wound nice and moist without allowing it to dry out Timothy Gibson, Timothy Gibson. ([id number]) and hopefully allow this to heal much more effectively and quickly. The patient actually did obtain approval and therefore we can apply that today. 10/23/2019 upon evaluation today patient's wound actually appears to be doing significantly better with regard to his toe ulcer. The tissue at the base of the wound is greatly improved and overall I am very pleased with the progress made. We did use Apligraf 2 weeks ago on the toe that did excellent for him much it has completely dissolved after as of the 2 weeks. They only had to change the dressing a few times due to drainage through. Obviously they left the Steri-Strips and Mepitel in place. Unfortunately we do not have the Apligraf available today for application regard therefore have to use a different product here in the clinic today and then order the Apligraf for next Thursday. That will be his second application. Objective Constitutional Well-nourished and well-hydrated in no acute distress. Vitals Time Taken: 10:00 AM, Height: 76 in, Weight: 244 lbs, BMI: 29.7, Temperature: 98.2 F, Pulse: 86 bpm,  Respiratory Rate: 20 breaths/min, Blood Pressure: 230/80 mmHg. Respiratory normal breathing without difficulty. Psychiatric this patient is able to make decisions and demonstrates good insight into disease process. Alert and Oriented x 3. pleasant and cooperative. General Notes: Upon inspection patient's wound bed again did not require any sharp debridement today which was great news in fact this one of the first times this has been the case. He seemed to be doing quite well I was able to mechanically wipe off the slough and debris without any sharp debridement necessary. Overall the wound bed appears to be doing excellent and is much healthier. Integumentary (Hair, Skin) Wound #3 status is Open. Original cause of wound was Gradually Appeared. The wound is located on the Left Toe Great. The wound measures 1.5cm length x 1cm width x 0.2cm depth; 1.178cm^2 area and 0.236cm^3 volume. There is Fat Layer (Subcutaneous Tissue) Exposed exposed. There is no tunneling or undermining noted. There is a medium amount of serous drainage noted. The wound margin is flat and intact. There is small (1-33%) pink granulation within the wound bed. There is a large (67-100%) amount of necrotic tissue within the wound bed including Adherent Slough. 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 Anesthetic (add to Medication List): Wound #3 Left Toe Great: Topical Lidocaine 4% cream applied to wound bed prior to debridement (In Clinic Only). Timothy Gibson, Timothy Gibson ([id number]) Primary Wound Dressing: Wound #3 Left Toe Great: Other: -  endoform Secondary Dressing: Wound #3 Left Toe Great: Conform/Kerlix Drawtex Dressing Change Frequency: Wound #3 Left Toe Great: Other: - As needed Follow-up Appointments: Wound #3 Left Toe Great: Return Appointment in 1 week. 1. My suggestion currently is can be that for this week we will use endoform and the patient will change this at home 1 or 2 times between his follow-up next Thursday. 2. I am also can recommend that we go ahead and order the Apligraf for next week and then we will reapply this at that point. We have been doing 2 weeks in between applications which I think will do well for him as well at that point. 3. I am also can a suggest the patient continue to monitor for any signs of infection obviously anything changes he will contact the office and let me know. We will see patient back for reevaluation in 1 week here in the clinic. If anything worsens or changes patient will contact our office for additional recommendations. Electronic Signature(s) Signed: 10/23/2019 5:29:26 PM By: Worthy Keeler PA-C Entered By: Worthy Keeler on 10/23/2019 17:29:25 Timothy Gibson, Timothy Gibson ([id number]) -------------------------------------------------------------------------------- SuperBill Details Patient Name: Timothy Gibson Date of Service: 10/23/2019 Medical Record Number: [id number] Patient Account Number: [id number] Date of Birth/Sex: August 28, 1950 (68 y.o. M) Treating RN: Army Melia Primary Care Provider: Merrie Roof Other Clinician: Referring Provider: Merrie Roof Treating Provider/Extender: Melburn Hake, Loveta Dellis Weeks in Treatment: 14 Diagnosis Coding ICD-10 Codes Code Description [phone] Other chronic osteomyelitis, left ankle and foot E11.621 Type 2 diabetes mellitus with foot ulcer L97.522 Non-pressure chronic ulcer of other part of left foot with fat layer exposed I10 Essential (primary) hypertension Facility Procedures CPT4 Code: [id number] Description: 99213 - WOUND CARE  VISIT-LEV 3 EST PT Modifier: Quantity: 1 Physician Procedures CPT4 Code: 7824235 Description: 36144 - WC PHYS LEVEL 3 - EST PT Modifier: Quantity: 1 CPT4 Code: Description: ICD-10 Diagnosis Description M86.672 Other chronic osteomyelitis, left ankle and foot E11.621 Type 2 diabetes mellitus with foot ulcer L97.522 Non-pressure chronic ulcer of other part of left foot with fat layer expose Modifier: d Quantity: Electronic Signature(s) Signed: 10/23/2019 5:30:51 PM By: Worthy Keeler PA-C Entered By: Worthy Keeler on 10/23/2019 17:30:51

## 2019-10-24 DIAGNOSIS — D631 Anemia in chronic kidney disease: Secondary | ICD-10-CM | POA: Diagnosis not present

## 2019-10-24 DIAGNOSIS — Z992 Dependence on renal dialysis: Secondary | ICD-10-CM | POA: Diagnosis not present

## 2019-10-24 DIAGNOSIS — D509 Iron deficiency anemia, unspecified: Secondary | ICD-10-CM | POA: Diagnosis not present

## 2019-10-24 DIAGNOSIS — N2581 Secondary hyperparathyroidism of renal origin: Secondary | ICD-10-CM | POA: Diagnosis not present

## 2019-10-24 DIAGNOSIS — N186 End stage renal disease: Secondary | ICD-10-CM | POA: Diagnosis not present

## 2019-10-27 DIAGNOSIS — N2581 Secondary hyperparathyroidism of renal origin: Secondary | ICD-10-CM | POA: Diagnosis not present

## 2019-10-27 DIAGNOSIS — D509 Iron deficiency anemia, unspecified: Secondary | ICD-10-CM | POA: Diagnosis not present

## 2019-10-27 DIAGNOSIS — N186 End stage renal disease: Secondary | ICD-10-CM | POA: Diagnosis not present

## 2019-10-27 DIAGNOSIS — D631 Anemia in chronic kidney disease: Secondary | ICD-10-CM | POA: Diagnosis not present

## 2019-10-27 DIAGNOSIS — Z992 Dependence on renal dialysis: Secondary | ICD-10-CM | POA: Diagnosis not present

## 2019-10-29 ENCOUNTER — Ambulatory Visit (INDEPENDENT_AMBULATORY_CARE_PROVIDER_SITE_OTHER): Payer: Medicare Other | Admitting: Family Medicine

## 2019-10-29 ENCOUNTER — Encounter: Payer: Self-pay | Admitting: Family Medicine

## 2019-10-29 ENCOUNTER — Other Ambulatory Visit: Payer: Self-pay

## 2019-10-29 VITALS — BP 163/80 | HR 105 | Temp 98.6°F | Wt 254.0 lb

## 2019-10-29 DIAGNOSIS — J449 Chronic obstructive pulmonary disease, unspecified: Secondary | ICD-10-CM | POA: Diagnosis not present

## 2019-10-29 DIAGNOSIS — E1159 Type 2 diabetes mellitus with other circulatory complications: Secondary | ICD-10-CM

## 2019-10-29 DIAGNOSIS — L97529 Non-pressure chronic ulcer of other part of left foot with unspecified severity: Secondary | ICD-10-CM

## 2019-10-29 DIAGNOSIS — I5032 Chronic diastolic (congestive) heart failure: Secondary | ICD-10-CM | POA: Diagnosis not present

## 2019-10-29 DIAGNOSIS — I10 Essential (primary) hypertension: Secondary | ICD-10-CM | POA: Diagnosis not present

## 2019-10-29 DIAGNOSIS — N2581 Secondary hyperparathyroidism of renal origin: Secondary | ICD-10-CM | POA: Diagnosis not present

## 2019-10-29 DIAGNOSIS — N186 End stage renal disease: Secondary | ICD-10-CM

## 2019-10-29 DIAGNOSIS — E11621 Type 2 diabetes mellitus with foot ulcer: Secondary | ICD-10-CM | POA: Diagnosis not present

## 2019-10-29 DIAGNOSIS — E1165 Type 2 diabetes mellitus with hyperglycemia: Secondary | ICD-10-CM | POA: Diagnosis not present

## 2019-10-29 DIAGNOSIS — I70213 Atherosclerosis of native arteries of extremities with intermittent claudication, bilateral legs: Secondary | ICD-10-CM | POA: Diagnosis not present

## 2019-10-29 DIAGNOSIS — Z992 Dependence on renal dialysis: Secondary | ICD-10-CM | POA: Diagnosis not present

## 2019-10-29 DIAGNOSIS — I152 Hypertension secondary to endocrine disorders: Secondary | ICD-10-CM

## 2019-10-29 DIAGNOSIS — D631 Anemia in chronic kidney disease: Secondary | ICD-10-CM | POA: Diagnosis not present

## 2019-10-29 DIAGNOSIS — D509 Iron deficiency anemia, unspecified: Secondary | ICD-10-CM | POA: Diagnosis not present

## 2019-10-29 NOTE — Assessment & Plan Note (Signed)
Appears euvolemic, consistent with dialysis and fluid intake. Continue to monitor and current regimen

## 2019-10-29 NOTE — Assessment & Plan Note (Signed)
Stable and well controlled, continue inhaler regimen. No recent exacerbations

## 2019-10-29 NOTE — Assessment & Plan Note (Signed)
On dialysis, continue current regimen

## 2019-10-29 NOTE — Progress Notes (Signed)
BP (!) 163/80   Pulse (!) 105   Temp 98.6 F (37 C) (Oral)   Wt 254 lb (115.2 kg)   SpO2 97%   BMI 30.92 kg/m    Subjective:    Patient ID: Timothy Gibson, male    DOB: Jan 28, 1951, 69 y.o.   MRN: [id number]  HPI: Timothy Gibson is a 69 y.o. male  Chief Complaint  Patient presents with  . Hypertension  . Diabetes   Here today for chronic condition f/u.   Does not check home BPs. Readings at appts have been high but per patient has white coat hypertension. Daughter states he's only taking the losartan and wonders if he's supposed to also be on the amlodipine. Denies CP, SOB, dizziness.   Home BSs not being checked. Taking 12 units tresiba daily, trying to eat well. Last A1C was 6.8 last month per patient. Denies low blood sugar spells.   Needing new orthotic shoes due to his diabetes and left toe ulcer. Working with wound care on this issue, stopped seeing Podiatrist as he did not agree with the provider's approach. Requires inserts.   On dialysis for ESRD, stable.   Relevant past medical, surgical, family and social history reviewed and updated as indicated. Interim medical history since our last visit reviewed. Allergies and medications reviewed and updated.  Review of Systems  Per HPI unless specifically indicated above     Objective:    BP (!) 163/80   Pulse (!) 105   Temp 98.6 F (37 C) (Oral)   Wt 254 lb (115.2 kg)   SpO2 97%   BMI 30.92 kg/m   Wt Readings from Last 3 Encounters:  10/29/19 254 lb (115.2 kg)  09/17/19 245 lb (111.1 kg)  08/28/19 256 lb (116.1 kg)    Physical Exam Vitals and nursing note reviewed.  Constitutional:      Appearance: Normal appearance.  HENT:     Head: Atraumatic.  Eyes:     Extraocular Movements: Extraocular movements intact.     Conjunctiva/sclera: Conjunctivae normal.  Cardiovascular:     Rate and Rhythm: Normal rate and regular rhythm.  Pulmonary:     Effort: Pulmonary effort is normal.     Breath sounds:  Normal breath sounds.  Musculoskeletal:        General: Normal range of motion.     Cervical back: Normal range of motion and neck supple.  Skin:    General: Skin is warm.     Comments: Stable left great toe ulceration  Neurological:     General: No focal deficit present.     Mental Status: He is oriented to person, place, and time.  Psychiatric:        Mood and Affect: Mood normal.        Thought Content: Thought content normal.        Judgment: Judgment normal.     Results for orders placed or performed during the hospital encounter of 07/24/19  BUN  Result Value Ref Range   BUN 32 (H) 8 - 23 mg/dL  Creatinine, serum  Result Value Ref Range   Creatinine, Ser 8.43 (H) 0.61 - 1.24 mg/dL   GFR calc non Af Amer 6 (L) >60 mL/min   GFR calc Af Amer 7 (L) >60 mL/min  Glucose, capillary  Result Value Ref Range   Glucose-Capillary 82 70 - 99 mg/dL  Glucose, capillary  Result Value Ref Range   Glucose-Capillary 74 70 - 99 mg/dL  Assessment & Plan:   Problem List Items Addressed This Visit      Cardiovascular and Mediastinum   Atherosclerosis of native arteries of extremity with intermittent claudication ([place])    Following with vascular specialist, continue per their recommendations      Hypertension associated with diabetes ([place])    Elevated persistently but not taking both medications. Start amlodipine, continue losartan and start monitoring home readings. Recheck 1 month      Chronic diastolic heart failure ([facility])    Appears euvolemic, consistent with dialysis and fluid intake. Continue to monitor and current regimen        Respiratory   COPD (chronic obstructive pulmonary disease) ([facility])    Stable and well controlled, continue inhaler regimen. No recent exacerbations        Endocrine   Type 2 diabetes mellitus with hyperglycemia ([place])    Will obtain recent lab records from Dialysis center, continue current regimen with close monitoring. Strongly recommended  home BS checks      Diabetic ulcer of left great toe ([place])    Will complete forms for diabetic shoes with inserts and scan into chart. Continue working with wound care        Genitourinary   End stage renal disease ([place]) - Primary    On dialysis, continue current regimen          Follow up plan: Return in about 4 weeks (around 11/26/2019) for BP.

## 2019-10-29 NOTE — Assessment & Plan Note (Signed)
Elevated persistently but not taking both medications. Start amlodipine, continue losartan and start monitoring home readings. Recheck 1 month

## 2019-10-29 NOTE — Assessment & Plan Note (Signed)
Will complete forms for diabetic shoes with inserts and scan into chart. Continue working with wound care

## 2019-10-29 NOTE — Assessment & Plan Note (Signed)
Will obtain recent lab records from Dialysis center, continue current regimen with close monitoring. Strongly recommended home BS checks

## 2019-10-29 NOTE — Assessment & Plan Note (Signed)
Following with vascular specialist, continue per their recommendations

## 2019-10-30 ENCOUNTER — Encounter: Payer: Medicare Other | Admitting: Internal Medicine

## 2019-10-30 DIAGNOSIS — L97522 Non-pressure chronic ulcer of other part of left foot with fat layer exposed: Secondary | ICD-10-CM | POA: Diagnosis not present

## 2019-10-30 DIAGNOSIS — M86672 Other chronic osteomyelitis, left ankle and foot: Secondary | ICD-10-CM | POA: Diagnosis not present

## 2019-10-30 DIAGNOSIS — E11621 Type 2 diabetes mellitus with foot ulcer: Secondary | ICD-10-CM | POA: Diagnosis not present

## 2019-10-30 DIAGNOSIS — F17290 Nicotine dependence, other tobacco product, uncomplicated: Secondary | ICD-10-CM | POA: Diagnosis not present

## 2019-10-30 DIAGNOSIS — E1122 Type 2 diabetes mellitus with diabetic chronic kidney disease: Secondary | ICD-10-CM | POA: Diagnosis not present

## 2019-10-30 DIAGNOSIS — E1169 Type 2 diabetes mellitus with other specified complication: Secondary | ICD-10-CM | POA: Diagnosis not present

## 2019-10-30 NOTE — Progress Notes (Signed)
HIROKI, WINT ([id number]) Visit Report for 10/30/2019 Arrival Information Details Patient Name: IYAN, FLETT Date of Service: 10/30/2019 1:15 PM Medical Record Number: [id number] Patient Account Number: [id number] Date of Birth/Sex: September 28, 1950 (69 y.o. M) Treating RN: Army Melia Primary Care Djeneba Barsch: Merrie Roof Other Clinician: Referring Guillermina Shaft: Merrie Roof Treating Jaylise Peek/Extender: Tito Dine in Treatment: 15 Visit Information History Since Last Visit Added or deleted any medications: No Patient Arrived: Ambulatory Any new allergies or adverse reactions: No Arrival Time: 13:08 Had a fall or experienced change in No Accompanied By: self activities of daily living that may affect Transfer Assistance: None risk of falls: Patient Identification Verified: Yes Signs or symptoms of abuse/neglect since last visito No Secondary Verification Process Completed: Yes Hospitalized since last visit: No Patient Requires Transmission-Based Precautions: No Implantable device outside of the clinic excluding No cellular tissue based products placed in the center since last visit: Has Dressing in Place as Prescribed: Yes Pain Present Now: No Electronic Signature(s) Signed: 10/30/2019 4:06:52 PM By: Lorine Bears RCP, RRT, CHT Entered By: Lorine Bears on 10/30/2019 13:11:04 [name] ([id number]) -------------------------------------------------------------------------------- Encounter Discharge Information Details Patient Name: [name] Date of Service: 10/30/2019 1:15 PM Medical Record Number: [id number] Patient Account Number: [id number] Date of Birth/Sex: 01/14/1951 (68 y.o. M) Treating RN: Army Melia Primary Care Puneet Masoner: Merrie Roof Other Clinician: Referring Amias Hutchinson: Merrie Roof Treating Vallie Teters/Extender: Tito Dine in Treatment: 15 Encounter Discharge Information Items Post Procedure  Vitals Discharge Condition: Stable Temperature (F): 98.4 Ambulatory Status: Ambulatory Pulse (bpm): 92 Discharge Destination: Home Respiratory Rate (breaths/min): 16 Transportation: Private Auto Blood Pressure (mmHg): 185/66 Accompanied By: self Schedule Follow-up Appointment: Yes Clinical Summary of Care: Electronic Signature(s) Signed: 10/30/2019 3:53:36 PM By: Army Melia Entered By: Army Melia on 10/30/2019 14:01:02 [name] ([id number]) -------------------------------------------------------------------------------- Lower Extremity Assessment Details Patient Name: [name] Date of Service: 10/30/2019 1:15 PM Medical Record Number: [id number] Patient Account Number: [id number] Date of Birth/Sex: May 19, 1951 (68 y.o. M) Treating RN: Cornell Barman Primary Care Aliece Honold: Merrie Roof Other Clinician: Referring Saundra Gin: Merrie Roof Treating Brianni Manthe/Extender: Tito Dine in Treatment: 15 Vascular Assessment Pulses: Dorsalis Pedis Palpable: [Left:Yes] Electronic Signature(s) Signed: 10/30/2019 5:10:21 PM By: Gretta Cool, BSN, RN, CWS, Kim RN, BSN Entered By: Gretta Cool, BSN, RN, CWS, Kim on 10/30/2019 13:29:56 KHADEN, GATER ([id number]) -------------------------------------------------------------------------------- Multi Wound Chart Details Patient Name: [name] Date of Service: 10/30/2019 1:15 PM Medical Record Number: [id number] Patient Account Number: [id number] Date of Birth/Sex: Nov 25, 1950 (68 y.o. M) Treating RN: Army Melia Primary Care Idona Stach: Merrie Roof Other Clinician: Referring Ulys Favia: Merrie Roof Treating Katrinka Herbison/Extender: Tito Dine in Treatment: 15 Vital Signs Height(in): 76 Pulse(bpm): 92 Weight(lbs): 244 Blood Pressure(mmHg): 185/66 Body Mass Index(BMI): 30 Temperature(F): 98.4 Respiratory Rate(breaths/min): 18 Photos: [N/A:N/A] Wound Location: Left Toe Great N/A N/A Wounding Event: Gradually  Appeared N/A N/A Primary Etiology: Diabetic Wound/Ulcer of the Lower N/A N/A Extremity Comorbid History: Lymphedema, Arrhythmia, N/A N/A Hypertension, Type II Diabetes, End Stage Renal Disease, Rheumatoid Arthritis, Osteoarthritis, Confinement Anxiety Date Acquired: 06/19/2019 N/A N/A Weeks of Treatment: 15 N/A N/A Wound Status: Open N/A N/A Measurements L x W x D (cm) 1.2x0.8x0.3 N/A N/A Area (cm) : 0.754 N/A N/A Volume (cm) : 0.226 N/A N/A % Reduction in Area: 72.20% N/A N/A % Reduction in Volume: 16.60% N/A N/A Starting Position 1 (o'clock): 12 Ending Position 1 (o'clock): 12 Maximum Distance 1 (cm): 0.5 Undermining: Yes N/A N/A Classification: Grade 2 N/A N/A Exudate  Amount: Medium N/A N/A Exudate Type: Serous N/A N/A Exudate Color: amber N/A N/A Wound Margin: Flat and Intact N/A N/A Granulation Amount: Medium (34-66%) N/A N/A Granulation Quality: Pink N/A N/A Necrotic Amount: Medium (34-66%) N/A N/A Exposed Structures: Fat Layer (Subcutaneous Tissue) N/A N/A Exposed: Yes Fascia: No Tendon: No Muscle: No Joint: No Bone: No Epithelialization: Small (1-33%) N/A N/A Debridement: Debridement - Excisional N/A N/A TONI, HOFFMEISTER ([id number]) Pre-procedure Verification/Time 13:34 N/A N/A Out Taken: Pain Control: Lidocaine N/A N/A Tissue Debrided: Subcutaneous, Slough N/A N/A Level: Skin/Subcutaneous Tissue N/A N/A Debridement Area (sq cm): 0.96 N/A N/A Instrument: Blade, Forceps N/A N/A Bleeding: Minimum N/A N/A Hemostasis Achieved: Silver Nitrate N/A N/A Debridement Treatment Procedure was tolerated well N/A N/A Response: Post Debridement Measurements 1.2x0.8x0.3 N/A N/A L x W x D (cm) Post Debridement Volume: (cm) 0.226 N/A N/A Procedures Performed: Cellular or Tissue Based Product N/A N/A Debridement Treatment Notes Electronic Signature(s) Signed: 10/30/2019 4:21:14 PM By: Linton Ham MD Entered By: Linton Ham on 10/30/2019 14:00:07 [name] ([id number]) -------------------------------------------------------------------------------- Multi-Disciplinary Care Plan Details Patient Name: [name] Date of Service: 10/30/2019 1:15 PM Medical Record Number: [id number] Patient Account Number: [id number] Date of Birth/Sex: 06-17-1951 (69 y.o. M) Treating RN: Army Melia Primary Care Freddye Cardamone: Merrie Roof Other Clinician: Referring Rosmarie Esquibel: Merrie Roof Treating Kassadi Presswood/Extender: Tito Dine in Treatment: 15 Active Inactive Abuse / Safety / Falls / Self Care Management Nursing Diagnoses: Potential for falls Goals: Patient will not experience any injury related to falls Date Initiated: 07/15/2019 Target Resolution Date: 10/18/2019 Goal Status: Active Interventions: Assess fall risk on admission and as needed Notes: Necrotic Tissue Nursing Diagnoses: Impaired tissue integrity related to necrotic/devitalized tissue Goals: Necrotic/devitalized tissue will be minimized in the wound bed Date Initiated: 07/15/2019 Target Resolution Date: 10/18/2019 Goal Status: Active Interventions: Provide education on necrotic tissue and debridement process Notes: Orientation to the Wound Care Program Nursing Diagnoses: Knowledge deficit related to the wound healing center program Goals: Patient/caregiver will verbalize understanding of the [facility] Date Initiated: 07/15/2019 Target Resolution Date: 10/18/2019 Goal Status: Active Interventions: Provide education on orientation to the wound center Notes: Peripheral Neuropathy Nursing Diagnoses: Knowledge deficit related to disease process and management of peripheral neurovascular dysfunction ALVAH, LAGROW ([id number]) Goals: Patient/caregiver will verbalize understanding of disease process and disease management Date Initiated: 07/15/2019 Target Resolution Date: 10/18/2019 Goal Status: Active Interventions: Assess signs and symptoms of  neuropathy upon admission and as needed Provide education on Management of Neuropathy and Related Ulcers Notes: Wound/Skin Impairment Nursing Diagnoses: Impaired tissue integrity Goals: Ulcer/skin breakdown will heal within 14 weeks Date Initiated: 07/15/2019 Target Resolution Date: 10/18/2019 Goal Status: Active Interventions: Assess patient/caregiver ability to obtain necessary supplies Assess patient/caregiver ability to perform ulcer/skin care regimen upon admission and as needed Assess ulceration(s) every visit Notes: Electronic Signature(s) Signed: 10/30/2019 3:53:36 PM By: Army Melia Entered By: Army Melia on 10/30/2019 13:33:26 [name] ([id number]) -------------------------------------------------------------------------------- Pain Assessment Details Patient Name: [name] Date of Service: 10/30/2019 1:15 PM Medical Record Number: [id number] Patient Account Number: [id number] Date of Birth/Sex: [birth year] (69 y.o. M) Treating RN: Cornell Barman Primary Care Alveta Quintela: Merrie Roof Other Clinician: Referring Dimple Bastyr: Merrie Roof Treating Titilayo Hagans/Extender: Tito Dine in Treatment: 15 Active Problems Location of Pain Severity and Description of Pain Patient Has Paino No Site Locations Pain Management and Medication Current Pain Management: Electronic Signature(s) Signed: 10/30/2019 5:10:21 PM By: Gretta Cool, BSN, RN, CWS, Kim RN, BSN Entered By: Gretta Cool, BSN,  RN, CWS, Kim on 10/30/2019 13:25:29 EUEL, CASTILE ([id number]) -------------------------------------------------------------------------------- Patient/Caregiver Education Details Patient Name: [name] Date of Service: 10/30/2019 1:15 PM Medical Record Number: [id number] Patient Account Number: [id number] Date of Birth/Gender: [birth year] (69 y.o. M) Treating RN: Army Melia Primary Care Physician: Merrie Roof Other Clinician: Referring Physician: Merrie Roof Treating  Physician/Extender: Tito Dine in Treatment: 15 Education Assessment Education Provided To: Patient Education Topics Provided Wound/Skin Impairment: Handouts: Caring for Your Ulcer Methods: Demonstration, Explain/Verbal Responses: State content correctly Electronic Signature(s) Signed: 10/30/2019 3:53:36 PM By: Army Melia Entered By: Army Melia on 10/30/2019 14:00:11 [name] ([id number]) -------------------------------------------------------------------------------- Wound Assessment Details Patient Name: [name] Date of Service: 10/30/2019 1:15 PM Medical Record Number: [id number] Patient Account Number: [id number] Date of Birth/Sex: 06-03-1951 (69 y.o. M) Treating RN: Cornell Barman Primary Care Rivers Hamrick: Merrie Roof Other Clinician: Referring Tayanna Talford: Merrie Roof Treating Stryder Poitra/Extender: Tito Dine in Treatment: 15 Wound Status Wound Number: 3 Primary Diabetic Wound/Ulcer of the Lower Extremity Etiology: Wound Location: Left Toe Great Wound Open Wounding Event: Gradually Appeared Status: Date Acquired: 06/19/2019 Comorbid Lymphedema, Arrhythmia, Hypertension, Type II Diabetes, Weeks Of Treatment: 15 History: End Stage Renal Disease, Rheumatoid Arthritis, Clustered Wound: No Osteoarthritis, Confinement Anxiety Photos Wound Measurements Length: (cm) 1.2 % Redu Width: (cm) 0.8 % Redu Depth: (cm) 0.3 Epithe Area: (cm) 0.754 Tunne Volume: (cm) 0.226 Under Sta End Max ction in Area: 72.2% ction in Volume: 16.6% lialization: Small (1-33%) ling: No mining: Yes rting Position (o'clock): 12 ing Position (o'clock): 12 imum Distance: (cm) 0.5 Wound Description Classification: Grade 2 Foul O Wound Margin: Flat and Intact Slough Exudate Amount: Medium Exudate Type: Serous Exudate Color: amber dor After Cleansing: No /Fibrino Yes Wound Bed Granulation Amount: Medium (34-66%) Exposed Structure Granulation Quality:  Pink Fascia Exposed: No Necrotic Amount: Medium (34-66%) Fat Layer (Subcutaneous Tissue) Exposed: Yes Necrotic Quality: Adherent Slough Tendon Exposed: No Muscle Exposed: No Joint Exposed: No Bone Exposed: No Treatment Notes Wound #3 (Left [address]BENTON, TOOKER ([id number]) Notes apligraft, drawtex conform, offloading shoe Electronic Signature(s) Signed: 10/30/2019 5:10:21 PM By: Gretta Cool, BSN, RN, CWS, Kim RN, BSN Entered By: Gretta Cool, BSN, RN, CWS, Kim on 10/30/2019 13:28:22 ANOTHONY, BURSCH ([id number]) -------------------------------------------------------------------------------- [place] Details Patient Name: [name] Date of Service: 10/30/2019 1:15 PM Medical Record Number: [id number] Patient Account Number: [id number] Date of Birth/Sex: 09-22-1950 (69 y.o. M) Treating RN: Army Melia Primary Care Nigel Ericsson: Merrie Roof Other Clinician: Referring Sesilia Poucher: Merrie Roof Treating Arnetta Odeh/Extender: Tito Dine in Treatment: 15 Vital Signs Time Taken: 13:10 Temperature (F): 98.4 Height (in): 76 Pulse (bpm): 92 Weight (lbs): 244 Respiratory Rate (breaths/min): 18 Body Mass Index (BMI): 29.7 Blood Pressure (mmHg): 185/66 Reference Range: 80 - 120 mg / dl Electronic Signature(s) Signed: 10/30/2019 4:06:52 PM By: Lorine Bears RCP, RRT, CHT Entered By: Lorine Bears on 10/30/2019 13:11:39

## 2019-10-30 NOTE — Progress Notes (Signed)
ALANTE, TOLAN ([id number]) Visit Report for 10/30/2019 Cellular or Tissue Based Product Details Patient Name: Timothy Gibson, Timothy Gibson Date of Service: 10/30/2019 1:15 PM Medical Record Number: [id number] Patient Account Number: [id number] Date of Birth/Sex: 06-12-1951 (69 y.o. M) Treating RN: Army Melia Primary Care Provider: Merrie Roof Other Clinician: Referring Provider: Merrie Roof Treating Provider/Extender: Tito Dine in Treatment: 15 Cellular or Tissue Based Product Type Wound #3 Left Toe Great Applied to: Performed By: Physician Ricard Dillon, MD Cellular or Tissue Based Product Type: Apligraf Level of Consciousness (Pre- Awake and Alert procedure): Pre-procedure Verification/Time Out Yes - 13:38 Taken: Location: genitalia / hands / feet / multiple digits Wound Size (sq cm): 0.96 Product Size (sq cm): 44 Waste Size (sq cm): 22 Waste Reason: wound size Amount of Product Applied (sq cm): 22 Instrument Used: Blade, Forceps, Scissors Lot #: [id number] Expiration Date: 11/11/2019 Fenestrated: Yes Instrument: Blade Reconstituted: Yes Solution Type: normal saline Solution Amount: 5ML Lot #: [id number] Solution Expiration Date: 07/10/2021 Secured: Yes Secured With: Steri-Strips Dressing Applied: Yes Primary Dressing: mepitel one Response to Treatment: Procedure was tolerated well Level of Consciousness (Post- Awake and Alert procedure): Post Procedure Diagnosis Same as Pre-procedure Electronic Signature(s) Signed: 10/30/2019 4:21:14 PM By: Linton Ham MD Entered By: Linton Ham on 10/30/2019 14:00:35 Timothy Gibson ([id number]) -------------------------------------------------------------------------------- Debridement Details Patient Name: Timothy Gibson Date of Service: 10/30/2019 1:15 PM Medical Record Number: [id number] Patient Account Number: [id number] Date of Birth/Sex: 17-Feb-1951 (69 y.o. M) Treating RN: Army Melia Primary Care  Provider: Merrie Roof Other Clinician: Referring Provider: Merrie Roof Treating Provider/Extender: Tito Dine in Treatment: 15 Debridement Performed for Wound #3 Left Toe Great Assessment: Performed By: Physician Ricard Dillon, MD Debridement Type: Debridement Severity of Tissue Pre Debridement: Fat layer exposed Level of Consciousness (Pre- Awake and Alert procedure): Pre-procedure Verification/Time Out Yes - 13:34 Taken: Start Time: 13:34 Pain Control: Lidocaine Total Area Debrided (L x W): 1.2 (cm) x 0.8 (cm) = 0.96 (cm) Tissue and other material debrided: Viable, Non-Viable, Slough, Subcutaneous, Skin: Dermis , Slough Level: Skin/Subcutaneous Tissue Debridement Description: Excisional Instrument: Blade, Forceps Bleeding: Minimum Hemostasis Achieved: Silver Nitrate End Time: 13:35 Response to Treatment: Procedure was tolerated well Level of Consciousness (Post- Awake and Alert procedure): Post Debridement Measurements of Total Wound Length: (cm) 1.2 Width: (cm) 0.8 Depth: (cm) 0.3 Volume: (cm) 0.226 Character of Wound/Ulcer Post Debridement: Stable Severity of Tissue Post Debridement: Fat layer exposed Post Procedure Diagnosis Same as Pre-procedure Electronic Signature(s) Signed: 10/30/2019 3:53:36 PM By: Army Melia Signed: 10/30/2019 4:21:14 PM By: Linton Ham MD Entered By: Linton Ham on 10/30/2019 14:00:21 Timothy Gibson ([id number]) -------------------------------------------------------------------------------- HPI Details Patient Name: Timothy Gibson Date of Service: 10/30/2019 1:15 PM Medical Record Number: [id number] Patient Account Number: [id number] Date of Birth/Sex: 05-Dec-1950 (69 y.o. M) Treating RN: Army Melia Primary Care Provider: Merrie Roof Other Clinician: Referring Provider: Merrie Roof Treating Provider/Extender: Tito Dine in Treatment: 15 History of Present Illness HPI Description:  11/27/17 on evaluation today patient presents for initial evaluation concerning an issue that he has been having with his right great toe which began last Thursday 11/22/17. He has a history of diabetes mellitus type to which he has had for greater than 30 years currently he is on insulin. Subsequently he also has a history of hypertension. On physical exam inspection is also appears he may have some peripheral vascular disease with his ABI's being noncompressible bilaterally. He is on dialysis as well and this  is on Monday, Wednesday, and Friday. Patient was hospitalized back in January/February 2019 although this was more for stomach/back pain though they never told me exactly what was going on. This is according to the patient. Subsequently the ulcer which is between the third and fourth toe when space of the right foot as well is on the plantar aspect of the fourth toe is due to him having cleaned his toes this morning and he states that his finger which is large split the toe tissue in between causing the wounds that we currently see. With that being said he states this has happened before I explained I would definitely recommend that he not do this any longer. He can use a small soft washcloth to clean between the toes without causing trauma. Subsequently the right great toe hatchery does show evidence of necrotic tissue present on the surface of the wound specifically there is callous and dead skin surrounding which is trapping fluid causing problems as far as the wound is concerned. The surface of the wound does show slough although due to his vascular flow I'm not going to sharply debride this today I think I will selectively debride away the necrotic skin as well is callous from around the surrounding so this will not continue to be a moisture issue. Nonetheless the patient has no pain he does have diabetic neuropathy. 12/06/17-He is here in follow-up evaluation for a right great toe ulcer. He is  voicing no complaints or concerns. He continues to infrequently/socially smokes cigars with no desire to quit. He has been compliant with offloading, using open toed surgical shoe; has been compliant using Santyl daily. He continues on clindamycin although takes it inconsistently secondary to indigestion. The plain film x-ray performed on 5/23 impression: Soft tissue swelling of the left first toe and is noted with probable irregular lucency involving distal tuft of first distal phalanx concerning for osteomyelitis. MRI may be performed for further evaluation; MRI ordered. The vascular evaluation performed on 5/24 field non-compressible ABIs bilaterally and reduced TBI bilaterally suggesting significant tibial disease. He will be referred to vascular medicine for further evaluation. 12/13/17-He is here in follow-up evaluation for right great toe ulcer. He has appointment next Thursday for the MRI and vascular evaluation. Wound culture obtained last week grew abundant Klebsiella oxytoca (multidrug sensitivity), and abundant enteric coccus faecalis (sensitive to ampicillin). Ampicillin and Cipro were called in, along with a probiotic. In light of his appointments next Thursday he will follow-up in 2 weeks, we will continue with Santyl 12/27/17-He is here for evaluation for a right great toe ulcer. 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 He did have evaluation by vascular medicine, Dr. Ronalee Belts, on 6/13, at that appointment it was decided for him to undergo angiography with possible intervention but he refused and is still considering. If he chooses to have it done he will contact the office. He has not heard back from either ID offices that he was referred to two weeks ago: [place] and [place]. there is improvement in both appearance and measurement to the ulcer. We will extend antibiotic therapy (amoxicillin 500 every 12,  Cipro 500 daily) for additional 2 weeks pending infectious disease consult, he will continue with Santyl daily. He was reminded to continue with probiotic therapy while on oral antibiotics; he denies any GI disturbance. 01/03/18-He is here in follow-up evaluation for right great toe ulcer. He is decided to not undergo  any vascular intervention at this time. He was established with [facility] infectious disease (Dr. Linus Salmons) last week initiated on vancomycin and cefazolin with dialysis treatment for 6 weeks. We will continue with current treatment plan with Santyl and offloading and he will follow-up in 2 weeks 01/17/18-He is seen in follow-up evaluation for right great toe ulcer. There is improvement in appearance with less nonviable tissue present. We will continue with same treatment plan he will follow-up next week. He is tolerating IV antibiotics without any complications 2/67/12-WP is seen in follow up evaluation for right great toe ulcer. There continues to be improvement. He is tolerating IV antibiotics with hemodialysis, completion date of 7/31. We will transition to collagen and and follow-up next week 02/07/18-He is here in follow up for a right great toe ulcer. There is red granulation tissue throughout the wound, improved in appearance. He completed antibiotics yesterday. He saw podiatry last Thursday for nail trimming, at that appointment he periwound callus was trimmed and a culture was obtained; culture negative. He does not have a follow up appointment with infectious disease. We will submit for grafix. 02/14/18-He is here in follow-up evaluation for a right great toe ulcer. There is slow improvement, red granulation tissue throughout. The insurance approval for grafix is pending. We will continue with collagen and he'll follow-up next week 02/21/18-He is seen in follow-up evaluation for right great toe ulcer. There is improvement with red granulation tissue throughout. He was approved  for oasis and this was placed today. He will follow up next week 02/28/18-He is seen in follow-up evaluation for right great toe ulcer. The wound is stable, oasis applied and he'll follow-up next week 03/07/18-He is seen in follow-up violation of her right great toe ulcer. The wound is dry, no evidence of drainage. The wound appears healed today. He was painted with Betadine, instructed to paint with Betadine daily, cover with band-aid for the next week and then cover with band-aid for the following week continue with surgical shoe. He was encouraged to contact the clinic with any evidence of drainage, or change in appearance to toe/wound. He will be discharged from wound care services Readmission: 07/14/18 on evaluation today patient presents for initial evaluation our clinic concerning issues that he is having with his left great toe. We previously saw him in 2019 for his right great toe which was actually worse at that time and subsequently was able to be completely healed in the end although it did take several months. With that being said the patient is currently having an area on his left great toe that occurred initially as result of a blister that came up he's not really sure why or how. Subsequently this was initially treated by his podiatrist though the recommendation there was apparently amputation according to the patient and his daughter who was present with him today. With that being said the patient did not want to proceed with amputation and would like to try to perform wound care and get this to heal without having to go down that road. We were able to do that with his other Timothy Gibson, Timothy Gibson. ([id number]) toe and so he would like to at least give that a try before going forward with amputation. Again I completely understand his concern in this regard. With that being said I did discuss with the patient obviously that it's always a chance that we cannot get this to heal with normal wound  care measures but obviously we will give it our best shot.  He does actually have an appointment scheduled for later today with the vascular specialist in order to evaluate his blood flow. That was at the recommendation of his podiatrist as well. Obviously if he is having good arterial flow and nonetheless is still experiencing issues with having the wound delay in healing then it may simply be a matter of we need to initiate more aggressive wound to therapy in order to see if we can get this to heal. No fevers, chills, nausea, or vomiting noted at this time. The patient notes that this woman has been present for approximately three weeks. He is not a smoker. His most recent hemoglobin A1c was 6.2 on 06/18/19. 07/17/2019 on evaluation today patient appears unfortunately to not be doing as well. He did see vascular I did review the note as well. That was on 07/15/2019. Subsequently there can actually be taking him to the OR for an angiogram due to poor arterial flow into this extremity. Apparently the atherosclerotic changes were severe. The patient is stated to be at risk for limb loss. Nonetheless the good news is this is being scheduled fairly quickly he will have the surgery/procedure next Thursday. With that being said I am going also go ahead based on the results of his x-ray which showed some chance of osteomyelitis in the distal portion of the toe get the patient set up for an MRI to further evaluate for the possibility of osteomyelitis obviously if he does have osteomyelitis we need to know so that we can address this appropriately. 07/29/2019 upon evaluation today patient appears to be doing better in general with regard to his toe ulcer. He does have much better blood flow you can actually feel a palpable pulse which appears to be very strong at this time. I did review his arterial intervention/angiogram which did show that he had evidence of sufficient arterial flow restoration at this point. He  did have occlusions that were 60 and 80% that residually were only 10 and 25% with no limiting flow. This is excellent news and hopefully he will continue to show signs of improvement in light of the improved vascular status. With that being said the toe mainly shows somewhat of an eschar today I think that this is fairly stable and as such probably would be best not to really do much in the way of debridement at this point I do believe Betadine would likely be a good option. 08/07/2019 on evaluation today patient actually appears to be doing about the same. The one difference is the eschar that we were just seeing if we can maintain is actually starting to loosen up and leak around the edges I think it is time to actually go ahead and remove this today. Is also no longer stable is very soft. Nonetheless the patient is okay with proceeding with debridement at this point. Fortunately there is no signs of active infection. No fevers, chills, nausea, vomiting, or diarrhea. The patient states that he took his last antibiotic that he had last night. He would need a refill as he will not be seeing infectious disease until February 2. Nonetheless obviously I do not want him to have any break in therapy especially when he is doing as well as he is currently. 08/14/2019 on evaluation today patient appears to be doing quite well all things considered in regard to his toe. I do feel like he is making progress and though this is not completely healed by any means we still have some  ways to go I feel like he is making progress which is excellent. There is no sign of active infection at this time systemically. He does have osteomyelitis of the distal phalanx of his great toe on the left. He did see Dr. Linus Salmons. That is the infectious disease doctor in [place] where he was referred. Dr. Linus Salmons apparently according to the note did not feel like the patient's MRI revealed osteomyelitis but just reactive marrow and  subsequently did not recommend any antibiotic therapy whatsoever. To be peripherally honest I am not really in agreement with this based on the MRI results and what we are seeing and I think he has been responding at least decently well to oral medication as well. Nonetheless we may want to check into the possibility of IV antibiotics being administered at the dialysis center. I will discuss this with Dr. Dellia Nims further. 08/21/2019 upon evaluation today patient appears at this point to be making some progress with regard to his wounds. He has been tolerating the dressing changes without complication. Fortunately there is no evidence of active infection at this time. Overall I feel like he is doing well with the oral antibiotics I discussed with Dr. Dellia Nims the possibility of doing IV antibiotic therapy versus continuing with the oral. His opinion was if the patient is doing well with oral to maybe stick with that. Obviously we can initiate additional Cipro as well just to help ensure that there is nothing worsening here. The Cipro should be beneficial for him as well. That helps cover more gram-negative's were is at the doxycycline is more gram-positive's. 09/02/2019 on evaluation today patient appears to be doing fairly well upon inspection today. With that being said though the patient is continuing to do well he does also continue to develop issues with buildup of necrotic tissue on the distal portion of his toe he does have good arterial flow however. For that reason I am going to suggest that what we may need to do is consider using something such as Santyl to try to keep this free and clear. Also discussed in greater detail HBO therapy with him today please see plan for additional recommendations and results of this discussion. 09/11/2019 upon evaluation today patient appears to be doing well with regard to his toe ulcer. This actually appears to be much better today using the Santyl than it was  previous. Fortunately there is no signs of active infection. No fevers, chills, nausea, vomiting, or diarrhea. 09/18/2019 upon evaluation today patient appears to be doing much better in regard to his wound in general. The Santyl was doing a good job at loosening stuff up here for [place] which is excellent news. With that being said he is still taking the oral antibiotic. I sent this in last for him on 09/03/2019. This was the doxycycline. When this runs out he will actually be complete as far as the treatment is concerned. With that being said I am getting continue the Cipro for 1 additional month. Overall his wound seems to be doing quite well. 09/25/2019 upon evaluation today patient appears to be doing well with regard to his toe ulcer. This is measuring much better and overall seems to be making good progress. Fortunately there is no signs of active infection at this time. No fevers, chills, nausea, vomiting, or diarrhea. 10/02/2019 upon evaluation today patient appears to be doing well with regard to his wound. He has been tolerating the dressing changes without complication. Fortunately there is no signs of  active infection at this time. No fevers, chills, nausea, vomiting, or diarrhea. Overall I am extremely pleased with how things seem to be progressing. I think the wound bed is at the point now where we can likely proceed with additional measures to try to get this healed more quickly I think Dermagraft may be a good possibility for the patient. We can definitely look into seeing about approval for this. 10/09/2019 upon evaluation today patient's wound actually appears to be doing well although it still is progressing somewhat slowly compared to what I would like to see. With that being said I did order Apligraf for the patient which I felt would keep the wound nice and moist without allowing it to dry out and hopefully allow this to heal much more effectively and quickly. The patient actually did  obtain approval and therefore we can apply that today. 10/23/2019 upon evaluation today patient's wound actually appears to be doing significantly better with regard to his toe ulcer. The tissue at the base of the wound is greatly improved and overall I am very pleased with the progress made. We did use Apligraf 2 weeks ago on the toe that did excellent for him much it has completely dissolved after as of the 2 weeks. They only had to change the dressing a few times due to drainage through. Obviously Timothy Gibson, Timothy Gibson ([id number]) they left the Steri-Strips and Mepitel in place. Unfortunately we do not have the Apligraf available today for application regard therefore have to use a different product here in the clinic today and then order the Apligraf for next Thursday. That will be his second application. 4/22; patient arrived with overhanging skin and subcutaneous tissue on the wound margins this was removed. Apligraf #2 applied Electronic Signature(s) Signed: 10/30/2019 4:21:14 PM By: Linton Ham MD Entered By: Linton Ham on 10/30/2019 14:01:46 Timothy Gibson ([id number]) -------------------------------------------------------------------------------- Physical Exam Details Patient Name: Timothy Gibson Date of Service: 10/30/2019 1:15 PM Medical Record Number: [id number] Patient Account Number: [id number] Date of Birth/Sex: 10-22-1950 (70 y.o. M) Treating RN: Army Melia Primary Care Provider: Merrie Roof Other Clinician: Referring Provider: Merrie Roof Treating Provider/Extender: Tito Dine in Treatment: 15 Constitutional Patient is hypertensive.. Pulse regular and within target range for patient.Marland Kitchen Respirations regular, non-labored and within target range.. Temperature is normal and within the target range for the patient.Marland Kitchen appears in no distress. Notes Wound exam; skin and subcutaneous tissue around the wound margins. This was removed with a #10 scalpel and  pickups. Hemostasis with silver nitrate and a pressure dressing. We then applied Apligraf #2 in the standard fashion Electronic Signature(s) Signed: 10/30/2019 4:21:14 PM By: Linton Ham MD Entered By: Linton Ham on 10/30/2019 14:02:50 Timothy Gibson ([id number]) -------------------------------------------------------------------------------- Physician Orders Details Patient Name: Timothy Gibson Date of Service: 10/30/2019 1:15 PM Medical Record Number: [id number] Patient Account Number: [id number] Date of Birth/Sex: Jan 02, 1951 (69 y.o. M) Treating RN: Army Melia Primary Care Provider: Merrie Roof Other Clinician: Referring Provider: Merrie Roof Treating Provider/Extender: Tito Dine in Treatment: 15 Verbal / Phone Orders: No Diagnosis Coding Wound Cleansing Wound #3 Left Toe Great o Dial antibacterial soap, wash wounds, rinse and pat dry prior to dressing wounds o May Shower, gently pat wound dry prior to applying new dressing. Anesthetic (add to Medication List) Wound #3 Left Toe Great o Topical Lidocaine 4% cream applied to wound bed prior to debridement (In Clinic Only). Primary Wound Dressing Wound #3 Left Toe Great o Other: - Apligraft applied  Secondary Dressing Wound #3 Left Toe Great o Conform/Kerlix o Drawtex Dressing Change Frequency Wound #3 Left Toe Great o Other: - As needed Follow-up Appointments Wound #3 Left Toe Great o Return Appointment in 2 weeks. Advanced Therapies Wound #3 Left Toe Great o Apligraf application in clinic; including contact layer, fixation with steri strips, dry gauze and cover dressing. Electronic Signature(s) Signed: 10/30/2019 3:53:36 PM By: Army Melia Signed: 10/30/2019 4:21:14 PM By: Linton Ham MD Entered By: Army Melia on 10/30/2019 13:59:56 Timothy Gibson, Timothy Gibson ([id number]) -------------------------------------------------------------------------------- Problem List  Details Patient Name: Timothy Gibson Date of Service: 10/30/2019 1:15 PM Medical Record Number: [id number] Patient Account Number: [id number] Date of Birth/Sex: 04-11-1951 (69 y.o. M) Treating RN: Army Melia Primary Care Provider: Merrie Roof Other Clinician: Referring Provider: Merrie Roof Treating Provider/Extender: Tito Dine in Treatment: 15 Active Problems ICD-10 Evaluated Encounter Code Description Active Date Today Diagnosis [phone] Other chronic osteomyelitis, left ankle and foot 07/29/2019 No Yes E11.621 Type 2 diabetes mellitus with foot ulcer 07/15/2019 No Yes L97.522 Non-pressure chronic ulcer of other part of left foot with fat layer exposed 07/15/2019 No Yes I10 Essential (primary) hypertension 07/15/2019 No Yes Inactive Problems Resolved Problems Electronic Signature(s) Signed: 10/30/2019 4:21:14 PM By: Linton Ham MD Entered By: Linton Ham on 10/30/2019 13:59:58 Timothy Gibson ([id number]) -------------------------------------------------------------------------------- Progress Note Details Patient Name: Timothy Gibson Date of Service: 10/30/2019 1:15 PM Medical Record Number: [id number] Patient Account Number: [id number] Date of Birth/Sex: May 25, 1951 (69 y.o. M) Treating RN: Army Melia Primary Care Provider: Merrie Roof Other Clinician: Referring Provider: Merrie Roof Treating Provider/Extender: Tito Dine in Treatment: 15 Subjective History of Present Illness (HPI) 11/27/17 on evaluation today patient presents for initial evaluation concerning an issue that he has been having with his right great toe which began last Thursday 11/22/17. He has a history of diabetes mellitus type to which he has had for greater than 30 years currently he is on insulin. Subsequently he also has a history of hypertension. On physical exam inspection is also appears he may have some peripheral vascular disease with his ABI's  being noncompressible bilaterally. He is on dialysis as well and this is on Monday, Wednesday, and Friday. Patient was hospitalized back in January/February 2019 although this was more for stomach/back pain though they never told me exactly what was going on. This is according to the patient. Subsequently the ulcer which is between the third and fourth toe when space of the right foot as well is on the plantar aspect of the fourth toe is due to him having cleaned his toes this morning and he states that his finger which is large split the toe tissue in between causing the wounds that we currently see. With that being said he states this has happened before I explained I would definitely recommend that he not do this any longer. He can use a small soft washcloth to clean between the toes without causing trauma. Subsequently the right great toe hatchery does show evidence of necrotic tissue present on the surface of the wound specifically there is callous and dead skin surrounding which is trapping fluid causing problems as far as the wound is concerned. The surface of the wound does show slough although due to his vascular flow I'm not going to sharply debride this today I think I will selectively debride away the necrotic skin as well is callous from around the surrounding so this will not continue to be a moisture issue. Nonetheless the patient  has no pain he does have diabetic neuropathy. 12/06/17-He is here in follow-up evaluation for a right great toe ulcer. He is voicing no complaints or concerns. He continues to infrequently/socially smokes cigars with no desire to quit. He has been compliant with offloading, using open toed surgical shoe; has been compliant using Santyl daily. He continues on clindamycin although takes it inconsistently secondary to indigestion. The plain film x-ray performed on 5/23 impression: Soft tissue swelling of the left first toe and is noted with probable irregular  lucency involving distal tuft of first distal phalanx concerning for osteomyelitis. MRI may be performed for further evaluation; MRI ordered. The vascular evaluation performed on 5/24 field non-compressible ABIs bilaterally and reduced TBI bilaterally suggesting significant tibial disease. He will be referred to vascular medicine for further evaluation. 12/13/17-He is here in follow-up evaluation for right great toe ulcer. He has appointment next Thursday for the MRI and vascular evaluation. Wound culture obtained last week grew abundant Klebsiella oxytoca (multidrug sensitivity), and abundant enteric coccus faecalis (sensitive to ampicillin). Ampicillin and Cipro were called in, along with a probiotic. In light of his appointments next Thursday he will follow-up in 2 weeks, we will continue with Santyl 12/27/17-He is here for evaluation for a right great toe ulcer. 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 He did have evaluation by vascular medicine, Dr. Ronalee Belts, on 6/13, at that appointment it was decided for him to undergo angiography with possible intervention but he refused and is still considering. If he chooses to have it done he will contact the office. He has not heard back from either ID offices that he was referred to two weeks ago: [place] and [place]. there is improvement in both appearance and measurement to the ulcer. We will extend antibiotic therapy (amoxicillin 500 every 12, Cipro 500 daily) for additional 2 weeks pending infectious disease consult, he will continue with Santyl daily. He was reminded to continue with probiotic therapy while on oral antibiotics; he denies any GI disturbance. 01/03/18-He is here in follow-up evaluation for right great toe ulcer. He is decided to not undergo any vascular intervention at this time. He was established with [facility] infectious disease (Dr. Linus Salmons) last week  initiated on vancomycin and cefazolin with dialysis treatment for 6 weeks. We will continue with current treatment plan with Santyl and offloading and he will follow-up in 2 weeks 01/17/18-He is seen in follow-up evaluation for right great toe ulcer. There is improvement in appearance with less nonviable tissue present. We will continue with same treatment plan he will follow-up next week. He is tolerating IV antibiotics without any complications 8/78/67-EH is seen in follow up evaluation for right great toe ulcer. There continues to be improvement. He is tolerating IV antibiotics with hemodialysis, completion date of 7/31. We will transition to collagen and and follow-up next week 02/07/18-He is here in follow up for a right great toe ulcer. There is red granulation tissue throughout the wound, improved in appearance. He completed antibiotics yesterday. He saw podiatry last Thursday for nail trimming, at that appointment he periwound callus was trimmed and a culture was obtained; culture negative. He does not have a follow up appointment with infectious disease. We will submit for grafix. 02/14/18-He is here in follow-up evaluation for a right great toe ulcer. There is slow improvement, red granulation tissue throughout. The insurance approval for grafix is pending. We will continue with collagen and he'll follow-up next  week 02/21/18-He is seen in follow-up evaluation for right great toe ulcer. There is improvement with red granulation tissue throughout. He was approved for oasis and this was placed today. He will follow up next week 02/28/18-He is seen in follow-up evaluation for right great toe ulcer. The wound is stable, oasis applied and he'll follow-up next week 03/07/18-He is seen in follow-up violation of her right great toe ulcer. The wound is dry, no evidence of drainage. The wound appears healed today. He was painted with Betadine, instructed to paint with Betadine daily, cover with band-aid for  the next week and then cover with band-aid for the following week continue with surgical shoe. He was encouraged to contact the clinic with any evidence of drainage, or change in appearance to toe/wound. He will be discharged from wound care services Readmission: 07/14/18 on evaluation today patient presents for initial evaluation our clinic concerning issues that he is having with his left great toe. We previously saw him in 2019 for his right great toe which was actually worse at that time and subsequently was able to be completely healed in the end although it did take several months. With that being said the patient is currently having an area on his left great toe that occurred initially as result of a blister that came up he's not really sure why or how. Subsequently this was initially treated by his podiatrist though the recommendation there was apparently amputation according to the patient and his daughter who was present with him today. With that being said the patient did not want to proceed with amputation and would like to try to perform wound care and get this to heal without having to go down that road. We were able to do that with his other Timothy Gibson, Timothy Gibson. ([id number]) toe and so he would like to at least give that a try before going forward with amputation. Again I completely understand his concern in this regard. With that being said I did discuss with the patient obviously that it's always a chance that we cannot get this to heal with normal wound care measures but obviously we will give it our best shot. He does actually have an appointment scheduled for later today with the vascular specialist in order to evaluate his blood flow. That was at the recommendation of his podiatrist as well. Obviously if he is having good arterial flow and nonetheless is still experiencing issues with having the wound delay in healing then it may simply be a matter of we need to initiate more  aggressive wound to therapy in order to see if we can get this to heal. No fevers, chills, nausea, or vomiting noted at this time. The patient notes that this woman has been present for approximately three weeks. He is not a smoker. His most recent hemoglobin A1c was 6.2 on 06/18/19. 07/17/2019 on evaluation today patient appears unfortunately to not be doing as well. He did see vascular I did review the note as well. That was on 07/15/2019. Subsequently there can actually be taking him to the OR for an angiogram due to poor arterial flow into this extremity. Apparently the atherosclerotic changes were severe. The patient is stated to be at risk for limb loss. Nonetheless the good news is this is being scheduled fairly quickly he will have the surgery/procedure next Thursday. With that being said I am going also go ahead based on the results of his x-ray which showed some chance of osteomyelitis in the distal  portion of the toe get the patient set up for an MRI to further evaluate for the possibility of osteomyelitis obviously if he does have osteomyelitis we need to know so that we can address this appropriately. 07/29/2019 upon evaluation today patient appears to be doing better in general with regard to his toe ulcer. He does have much better blood flow you can actually feel a palpable pulse which appears to be very strong at this time. I did review his arterial intervention/angiogram which did show that he had evidence of sufficient arterial flow restoration at this point. He did have occlusions that were 60 and 80% that residually were only 10 and 25% with no limiting flow. This is excellent news and hopefully he will continue to show signs of improvement in light of the improved vascular status. With that being said the toe mainly shows somewhat of an eschar today I think that this is fairly stable and as such probably would be best not to really do much in the way of debridement at this point I do  believe Betadine would likely be a good option. 08/07/2019 on evaluation today patient actually appears to be doing about the same. The one difference is the eschar that we were just seeing if we can maintain is actually starting to loosen up and leak around the edges I think it is time to actually go ahead and remove this today. Is also no longer stable is very soft. Nonetheless the patient is okay with proceeding with debridement at this point. Fortunately there is no signs of active infection. No fevers, chills, nausea, vomiting, or diarrhea. The patient states that he took his last antibiotic that he had last night. He would need a refill as he will not be seeing infectious disease until February 2. Nonetheless obviously I do not want him to have any break in therapy especially when he is doing as well as he is currently. 08/14/2019 on evaluation today patient appears to be doing quite well all things considered in regard to his toe. I do feel like he is making progress and though this is not completely healed by any means we still have some ways to go I feel like he is making progress which is excellent. There is no sign of active infection at this time systemically. He does have osteomyelitis of the distal phalanx of his great toe on the left. He did see Dr. Linus Salmons. That is the infectious disease doctor in [place] where he was referred. Dr. Linus Salmons apparently according to the note did not feel like the patient's MRI revealed osteomyelitis but just reactive marrow and subsequently did not recommend any antibiotic therapy whatsoever. To be peripherally honest I am not really in agreement with this based on the MRI results and what we are seeing and I think he has been responding at least decently well to oral medication as well. Nonetheless we may want to check into the possibility of IV antibiotics being administered at the dialysis center. I will discuss this with Dr. Dellia Nims further. 08/21/2019 upon  evaluation today patient appears at this point to be making some progress with regard to his wounds. He has been tolerating the dressing changes without complication. Fortunately there is no evidence of active infection at this time. Overall I feel like he is doing well with the oral antibiotics I discussed with Dr. Dellia Nims the possibility of doing IV antibiotic therapy versus continuing with the oral. His opinion was if the patient is doing well  with oral to maybe stick with that. Obviously we can initiate additional Cipro as well just to help ensure that there is nothing worsening here. The Cipro should be beneficial for him as well. That helps cover more gram-negative's were is at the doxycycline is more gram-positive's. 09/02/2019 on evaluation today patient appears to be doing fairly well upon inspection today. With that being said though the patient is continuing to do well he does also continue to develop issues with buildup of necrotic tissue on the distal portion of his toe he does have good arterial flow however. For that reason I am going to suggest that what we may need to do is consider using something such as Santyl to try to keep this free and clear. Also discussed in greater detail HBO therapy with him today please see plan for additional recommendations and results of this discussion. 09/11/2019 upon evaluation today patient appears to be doing well with regard to his toe ulcer. This actually appears to be much better today using the Santyl than it was previous. Fortunately there is no signs of active infection. No fevers, chills, nausea, vomiting, or diarrhea. 09/18/2019 upon evaluation today patient appears to be doing much better in regard to his wound in general. The Santyl was doing a good job at loosening stuff up here for [place] which is excellent news. With that being said he is still taking the oral antibiotic. I sent this in last for him on 09/03/2019. This was the doxycycline. When  this runs out he will actually be complete as far as the treatment is concerned. With that being said I am getting continue the Cipro for 1 additional month. Overall his wound seems to be doing quite well. 09/25/2019 upon evaluation today patient appears to be doing well with regard to his toe ulcer. This is measuring much better and overall seems to be making good progress. Fortunately there is no signs of active infection at this time. No fevers, chills, nausea, vomiting, or diarrhea. 10/02/2019 upon evaluation today patient appears to be doing well with regard to his wound. He has been tolerating the dressing changes without complication. Fortunately there is no signs of active infection at this time. No fevers, chills, nausea, vomiting, or diarrhea. Overall I am extremely pleased with how things seem to be progressing. I think the wound bed is at the point now where we can likely proceed with additional measures to try to get this healed more quickly I think Dermagraft may be a good possibility for the patient. We can definitely look into seeing about approval for this. 10/09/2019 upon evaluation today patient's wound actually appears to be doing well although it still is progressing somewhat slowly compared to what I would like to see. With that being said I did order Apligraf for the patient which I felt would keep the wound nice and moist without allowing it to dry out and hopefully allow this to heal much more effectively and quickly. The patient actually did obtain approval and therefore we can apply that today. 10/23/2019 upon evaluation today patient's wound actually appears to be doing significantly better with regard to his toe ulcer. The tissue at the base of the wound is greatly improved and overall I am very pleased with the progress made. We did use Apligraf 2 weeks ago on the toe that did excellent for him much it has completely dissolved after as of the 2 weeks. They only had to change the  dressing a few times due  to drainage through. Obviously Timothy Gibson, Timothy Gibson ([id number]) they left the Steri-Strips and Mepitel in place. Unfortunately we do not have the Apligraf available today for application regard therefore have to use a different product here in the clinic today and then order the Apligraf for next Thursday. That will be his second application. 4/22; patient arrived with overhanging skin and subcutaneous tissue on the wound margins this was removed. Apligraf #2 applied Objective Constitutional Patient is hypertensive.. Pulse regular and within target range for patient.Marland Kitchen Respirations regular, non-labored and within target range.. Temperature is normal and within the target range for the patient.Marland Kitchen appears in no distress. Vitals Time Taken: 1:10 PM, Height: 76 in, Weight: 244 lbs, BMI: 29.7, Temperature: 98.4 F, Pulse: 92 bpm, Respiratory Rate: 18 breaths/min, Blood Pressure: 185/66 mmHg. General Notes: Wound exam; skin and subcutaneous tissue around the wound margins. This was removed with a #10 scalpel and pickups. Hemostasis with silver nitrate and a pressure dressing. We then applied Apligraf #2 in the standard fashion Integumentary (Hair, Skin) Wound #3 status is Open. Original cause of wound was Gradually Appeared. The wound is located on the Left Toe Great. The wound measures 1.2cm length x 0.8cm width x 0.3cm depth; 0.754cm^2 area and 0.226cm^3 volume. There is Fat Layer (Subcutaneous Tissue) Exposed exposed. There is no tunneling noted, however, there is undermining starting at 12:00 and ending at 12:00 with a maximum distance of 0.5cm. There is a medium amount of serous drainage noted. The wound margin is flat and intact. There is medium (34-66%) pink granulation within the wound bed. There is a medium (34-66%) amount of necrotic tissue within the wound bed including Adherent Slough. 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 There was a Excisional Skin/Subcutaneous Tissue Debridement with a total area of 0.96 sq cm performed by Ricard Dillon, MD. With the following instrument(s): Blade, and Forceps to remove Viable and Non-Viable tissue/material. Material removed includes Subcutaneous Tissue, Slough, and Skin: Dermis after achieving pain control using Lidocaine. A time out was conducted at 13:34, prior to the start of the procedure. A Minimum amount of bleeding was controlled with Silver Nitrate. The procedure was tolerated well. Post Debridement Measurements: 1.2cm length x 0.8cm width x 0.3cm depth; 0.226cm^3 volume. Character of Wound/Ulcer Post Debridement is stable. Severity of Tissue Post Debridement is: Fat layer exposed. Post procedure Diagnosis Wound #3: Same as Pre-Procedure Pre-procedure diagnosis of Wound #3 is a Diabetic Wound/Ulcer of the Lower Extremity located on the Left Toe Great. A skin graft procedure using a bioengineered skin substitute/cellular or tissue based product was performed by Ricard Dillon, MD with the following instrument(s): Blade, Forceps, and Scissors. Apligraf was applied and secured with Steri-Strips. 22 sq cm of product was utilized and 22 sq cm was wasted due to wound size. Post Application, mepitel one was applied. A Time Out was conducted at 13:38, prior to the start of the procedure. The procedure was tolerated well. Timothy Gibson, Timothy Gibson ([id number]) Post procedure Diagnosis Wound #3: Same as Pre-Procedure . Plan Wound Cleansing: Wound #3 Left Toe Great: Dial antibacterial soap, wash wounds, rinse and pat dry prior to  dressing wounds May Shower, gently  pat wound dry prior to applying new dressing. Anesthetic (add to Medication List): Wound #3 Left Toe Great: Topical Lidocaine 4% cream applied to wound bed prior to debridement (In Clinic Only). Primary Wound Dressing: Wound #3 Left Toe Great: Other: - Apligraft applied Secondary Dressing: Wound #3 Left Toe Great: Conform/Kerlix Drawtex Dressing Change Frequency: Wound #3 Left Toe Great: Other: - As needed Follow-up Appointments: Wound #3 Left Toe Great: Return Appointment in 2 weeks. Advanced Therapies: Wound #3 Left Toe Great: Apligraf application in clinic; including contact layer, fixation with steri strips, dry gauze and cover dressing. 1. Apligraf #2 applied in the standard fashion. 2. He is offloading this in a surgical boot Electronic Signature(s) Signed: 10/30/2019 4:21:14 PM By: Linton Ham MD Entered By: Linton Ham on 10/30/2019 14:03:28 Timothy Gibson, Timothy Gibson ([id number]) -------------------------------------------------------------------------------- [place] Details Patient Name: Timothy Gibson Date of Service: 10/30/2019 Medical Record Number: [id number] Patient Account Number: [id number] Date of Birth/Sex: 1951-06-12 (69 y.o. M) Treating RN: Army Melia Primary Care Provider: Merrie Roof Other Clinician: Referring Provider: Merrie Roof Treating Provider/Extender: Tito Dine in Treatment: 15 Diagnosis Coding ICD-10 Codes Code Description [phone] Other chronic osteomyelitis, left ankle and foot E11.621 Type 2 diabetes mellitus with foot ulcer L97.522 Non-pressure chronic ulcer of other part of left foot with fat layer exposed I10 Essential (primary) hypertension Facility Procedures CPT4 Code: [id number] Description: [phone] (Facility Use Only) Apligraf 44 SQ CM Modifier: Quantity: [place] Code: [id number] Description: 91660 - SKIN SUB GRAFT FACE/NK/HF/G Modifier: Quantity: 1 CPT4 Code: Description:  ICD-10 Diagnosis Description L97.522 Non-pressure chronic ulcer of other part of left foot with fat layer exposed Modifier: Quantity: Physician Procedures CPT4 Code: [id number] Description: 77414 - WC PHYS SKIN SUB GRAFT FACE/NK/HF/G Modifier: Quantity: 1 CPT4 Code: Description: ICD-10 Diagnosis Description L97.522 Non-pressure chronic ulcer of other part of left foot with fat layer exposed Modifier: Quantity: Electronic Signature(s) Signed: 10/30/2019 4:21:14 PM By: Linton Ham MD Entered By: Linton Ham on 10/30/2019 14:03:43

## 2019-10-31 DIAGNOSIS — D631 Anemia in chronic kidney disease: Secondary | ICD-10-CM | POA: Diagnosis not present

## 2019-10-31 DIAGNOSIS — Z992 Dependence on renal dialysis: Secondary | ICD-10-CM | POA: Diagnosis not present

## 2019-10-31 DIAGNOSIS — N186 End stage renal disease: Secondary | ICD-10-CM | POA: Diagnosis not present

## 2019-10-31 DIAGNOSIS — D509 Iron deficiency anemia, unspecified: Secondary | ICD-10-CM | POA: Diagnosis not present

## 2019-10-31 DIAGNOSIS — N2581 Secondary hyperparathyroidism of renal origin: Secondary | ICD-10-CM | POA: Diagnosis not present

## 2019-11-03 ENCOUNTER — Telehealth: Payer: Self-pay

## 2019-11-03 NOTE — Telephone Encounter (Signed)
Called patient to let him know his diabetic shoes Rx is ready for pick up. Spoke with Olegario Shearer pt's friend (DPR reviewed) and she stated she will come to pick it up today.

## 2019-11-05 DIAGNOSIS — N2581 Secondary hyperparathyroidism of renal origin: Secondary | ICD-10-CM | POA: Diagnosis not present

## 2019-11-05 DIAGNOSIS — N186 End stage renal disease: Secondary | ICD-10-CM | POA: Diagnosis not present

## 2019-11-05 DIAGNOSIS — D631 Anemia in chronic kidney disease: Secondary | ICD-10-CM | POA: Diagnosis not present

## 2019-11-05 DIAGNOSIS — Z992 Dependence on renal dialysis: Secondary | ICD-10-CM | POA: Diagnosis not present

## 2019-11-05 DIAGNOSIS — D509 Iron deficiency anemia, unspecified: Secondary | ICD-10-CM | POA: Diagnosis not present

## 2019-11-07 DIAGNOSIS — N186 End stage renal disease: Secondary | ICD-10-CM | POA: Diagnosis not present

## 2019-11-07 DIAGNOSIS — N2581 Secondary hyperparathyroidism of renal origin: Secondary | ICD-10-CM | POA: Diagnosis not present

## 2019-11-07 DIAGNOSIS — D631 Anemia in chronic kidney disease: Secondary | ICD-10-CM | POA: Diagnosis not present

## 2019-11-07 DIAGNOSIS — Z992 Dependence on renal dialysis: Secondary | ICD-10-CM | POA: Diagnosis not present

## 2019-11-07 DIAGNOSIS — D509 Iron deficiency anemia, unspecified: Secondary | ICD-10-CM | POA: Diagnosis not present

## 2019-11-10 DIAGNOSIS — Z992 Dependence on renal dialysis: Secondary | ICD-10-CM | POA: Diagnosis not present

## 2019-11-10 DIAGNOSIS — D631 Anemia in chronic kidney disease: Secondary | ICD-10-CM | POA: Diagnosis not present

## 2019-11-10 DIAGNOSIS — N186 End stage renal disease: Secondary | ICD-10-CM | POA: Diagnosis not present

## 2019-11-10 DIAGNOSIS — N2581 Secondary hyperparathyroidism of renal origin: Secondary | ICD-10-CM | POA: Diagnosis not present

## 2019-11-10 DIAGNOSIS — D509 Iron deficiency anemia, unspecified: Secondary | ICD-10-CM | POA: Diagnosis not present

## 2019-11-12 ENCOUNTER — Other Ambulatory Visit: Payer: Self-pay | Admitting: Family Medicine

## 2019-11-12 DIAGNOSIS — D509 Iron deficiency anemia, unspecified: Secondary | ICD-10-CM | POA: Diagnosis not present

## 2019-11-12 DIAGNOSIS — Z992 Dependence on renal dialysis: Secondary | ICD-10-CM | POA: Diagnosis not present

## 2019-11-12 DIAGNOSIS — D631 Anemia in chronic kidney disease: Secondary | ICD-10-CM | POA: Diagnosis not present

## 2019-11-12 DIAGNOSIS — N186 End stage renal disease: Secondary | ICD-10-CM | POA: Diagnosis not present

## 2019-11-12 DIAGNOSIS — N2581 Secondary hyperparathyroidism of renal origin: Secondary | ICD-10-CM | POA: Diagnosis not present

## 2019-11-13 ENCOUNTER — Other Ambulatory Visit: Payer: Self-pay

## 2019-11-13 ENCOUNTER — Encounter: Payer: Medicare Other | Attending: Physician Assistant | Admitting: Physician Assistant

## 2019-11-13 DIAGNOSIS — E1122 Type 2 diabetes mellitus with diabetic chronic kidney disease: Secondary | ICD-10-CM | POA: Diagnosis not present

## 2019-11-13 DIAGNOSIS — F17290 Nicotine dependence, other tobacco product, uncomplicated: Secondary | ICD-10-CM | POA: Insufficient documentation

## 2019-11-13 DIAGNOSIS — E1169 Type 2 diabetes mellitus with other specified complication: Secondary | ICD-10-CM | POA: Diagnosis not present

## 2019-11-13 DIAGNOSIS — I120 Hypertensive chronic kidney disease with stage 5 chronic kidney disease or end stage renal disease: Secondary | ICD-10-CM | POA: Insufficient documentation

## 2019-11-13 DIAGNOSIS — E11621 Type 2 diabetes mellitus with foot ulcer: Secondary | ICD-10-CM | POA: Insufficient documentation

## 2019-11-13 DIAGNOSIS — M069 Rheumatoid arthritis, unspecified: Secondary | ICD-10-CM | POA: Insufficient documentation

## 2019-11-13 DIAGNOSIS — N186 End stage renal disease: Secondary | ICD-10-CM | POA: Diagnosis not present

## 2019-11-13 DIAGNOSIS — Z992 Dependence on renal dialysis: Secondary | ICD-10-CM | POA: Diagnosis not present

## 2019-11-13 DIAGNOSIS — M86672 Other chronic osteomyelitis, left ankle and foot: Secondary | ICD-10-CM | POA: Diagnosis not present

## 2019-11-13 DIAGNOSIS — L97522 Non-pressure chronic ulcer of other part of left foot with fat layer exposed: Secondary | ICD-10-CM | POA: Diagnosis not present

## 2019-11-13 NOTE — Progress Notes (Addendum)
CRIMSON, BEER ([id number]) Visit Report for 11/13/2019 Chief Complaint Document Details Patient Name: Timothy Gibson, Timothy Gibson Date of Service: 11/13/2019 8:45 AM Medical Record Number: [id number] Patient Account Number: [id number] Date of Birth/Sex: 1950/07/31 (69 y.o. M) Treating RN: Army Melia Primary Care Provider: Merrie Roof Other Clinician: Referring Provider: Merrie Roof Treating Provider/Extender: Melburn Hake, Shatima Zalar Weeks in Treatment: 17 Information Obtained from: Patient Chief Complaint Left Great Toe Electronic Signature(s) Signed: 11/13/2019 8:47:57 AM By: Worthy Keeler PA-C Entered By: Worthy Keeler on 11/13/2019 08:47:57 Timothy Gibson, Timothy Gibson ([id number]) -------------------------------------------------------------------------------- Cellular or Tissue Based Product Details Patient Name: Timothy Gibson Date of Service: 11/13/2019 8:45 AM Medical Record Number: [id number] Patient Account Number: [id number] Date of Birth/Sex: 01/24/1951 (69 y.o. M) Treating RN: Army Melia Primary Care Provider: Merrie Roof Other Clinician: Referring Provider: Merrie Roof Treating Provider/Extender: Melburn Hake, Kem Parcher Weeks in Treatment: 17 Cellular or Tissue Based Product Type Wound #3 Left Toe Great Applied to: Performed By: Physician [name], Guillermo Difrancesco E., PA-C Cellular or Tissue Based Product Apligraf Type: Level of Consciousness (Pre- Awake and Alert procedure): Pre-procedure Verification/Time Out Yes - 08:52 Taken: Location: genitalia / hands / feet / multiple digits Wound Size (sq cm): 1.12 Product Size (sq cm): 44 Waste Size (sq cm): 22 Waste Reason: wound size Amount of Product Applied (sq cm): 22 Instrument Used: Forceps, Scissors Lot #: [id number] Expiration Date: 11/21/2019 Fenestrated: Yes Instrument: Blade Reconstituted: Yes Solution Type: normal saline Solution Amount: 5ML Lot #: [id number] Solution Expiration Date: 01/07/2021 Secured: Yes Secured With:  Steri-Strips Dressing Applied: Yes Primary Dressing: mepitel one Response to Treatment: Procedure was tolerated well Level of Consciousness (Post- Awake and Alert procedure): Post Procedure Diagnosis Same as Pre-procedure Electronic Signature(s) Signed: 11/13/2019 9:18:35 AM By: Army Melia Entered By: Army Melia on 11/13/2019 08:59:57 Timothy Gibson ([id number]) -------------------------------------------------------------------------------- Debridement Details Patient Name: Timothy Gibson Date of Service: 11/13/2019 8:45 AM Medical Record Number: [id number] Patient Account Number: [id number] Date of Birth/Sex: [birth year] (69 y.o. M) Treating RN: Army Melia Primary Care Provider: Merrie Roof Other Clinician: Referring Provider: Merrie Roof Treating Provider/Extender: Melburn Hake, Jaesean Litzau Weeks in Treatment: 17 Debridement Performed for Wound #3 Left Toe Great Assessment: Performed By: Physician [name], Azlin Zilberman E., PA-C Debridement Type: Debridement Severity of Tissue Pre Debridement: Fat layer exposed Level of Consciousness (Pre- Awake and Alert procedure): Pre-procedure Verification/Time Out Yes - 08:49 Taken: Start Time: 08:50 Pain Control: Lidocaine Total Area Debrided (L x W): 1.4 (cm) x 0.8 (cm) = 1.12 (cm) Tissue and other material Viable, Non-Viable, Callus, Slough, Subcutaneous, Slough debrided: Level: Skin/Subcutaneous Tissue Debridement Description: Excisional Instrument: Curette Bleeding: Minimum Hemostasis Achieved: Pressure End Time: 08:51 Response to Treatment: Procedure was tolerated well Level of Consciousness (Post- Awake and Alert procedure): Post Debridement Measurements of Total Wound Length: (cm) 1.4 Width: (cm) 0.8 Depth: (cm) 0.3 Volume: (cm) 0.264 Character of Wound/Ulcer Post Debridement: Stable Severity of Tissue Post Debridement: Fat layer exposed Post Procedure Diagnosis Same as Pre-procedure Electronic Signature(s) Signed:  11/13/2019 9:18:35 AM By: Army Melia Signed: 11/13/2019 4:59:38 PM By: Worthy Keeler PA-C Entered By: Army Melia on 11/13/2019 08:51:28 Timothy Gibson ([id number]) -------------------------------------------------------------------------------- HPI Details Patient Name: Timothy Gibson Date of Service: 11/13/2019 8:45 AM Medical Record Number: [id number] Patient Account Number: [id number] Date of Birth/Sex: 20-Apr-1951 (69 y.o. M) Treating RN: Army Melia Primary Care Provider: Merrie Roof Other Clinician: Referring Provider: Merrie Roof Treating Provider/Extender: Melburn Hake, Conan Mcmanaway Weeks in Treatment: 17 History of Present Illness HPI Description: 11/27/17 on  evaluation today patient presents for initial evaluation concerning an issue that he has been having with his right great toe which began last Thursday 11/22/17. He has a history of diabetes mellitus type to which he has had for greater than 30 years currently he is on insulin. Subsequently he also has a history of hypertension. On physical exam inspection is also appears he may have some peripheral vascular disease with his ABI's being noncompressible bilaterally. He is on dialysis as well and this is on Monday, Wednesday, and Friday. Patient was hospitalized back in January/February 2019 although this was more for stomach/back pain though they never told me exactly what was going on. This is according to the patient. Subsequently the ulcer which is between the third and fourth toe when space of the right foot as well is on the plantar aspect of the fourth toe is due to him having cleaned his toes this morning and he states that his finger which is large split the toe tissue in between causing the wounds that we currently see. With that being said he states this has happened before I explained I would definitely recommend that he not do this any longer. He can use a small soft washcloth to clean between the toes without causing  trauma. Subsequently the right great toe hatchery does show evidence of necrotic tissue present on the surface of the wound specifically there is callous and dead skin surrounding which is trapping fluid causing problems as far as the wound is concerned. The surface of the wound does show slough although due to his vascular flow I'm not going to sharply debride this today I think I will selectively debride away the necrotic skin as well is callous from around the surrounding so this will not continue to be a moisture issue. Nonetheless the patient has no pain he does have diabetic neuropathy. 12/06/17-He is here in follow-up evaluation for a right great toe ulcer. He is voicing no complaints or concerns. He continues to infrequently/socially smokes cigars with no desire to quit. He has been compliant with offloading, using open toed surgical shoe; has been compliant using Santyl daily. He continues on clindamycin although takes it inconsistently secondary to indigestion. The plain film x-ray performed on 5/23 impression: Soft tissue swelling of the left first toe and is noted with probable irregular lucency involving distal tuft of first distal phalanx concerning for osteomyelitis. MRI may be performed for further evaluation; MRI ordered. The vascular evaluation performed on 5/24 field non- compressible ABIs bilaterally and reduced TBI bilaterally suggesting significant tibial disease. He will be referred to vascular medicine for further evaluation. 12/13/17-He is here in follow-up evaluation for right great toe ulcer. He has appointment next Thursday for the MRI and vascular evaluation. Wound culture obtained last week grew abundant Klebsiella oxytoca (multidrug sensitivity), and abundant enteric coccus faecalis (sensitive to ampicillin). Ampicillin and Cipro were called in, along with a probiotic. In light of his appointments next Thursday he will follow-up in 2 weeks, we will continue with  Santyl 12/27/17-He is here for evaluation for a right great toe ulcer. 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 He did have evaluation by vascular medicine, Dr. Ronalee Belts, on 6/13, at that appointment it was decided for him to undergo angiography with possible intervention but he refused and is still considering. If he chooses to have it done he will contact the office. He has not heard back from  either ID offices that he was referred to two weeks ago: [place] and [place]. there is improvement in both appearance and measurement to the ulcer. We will extend antibiotic therapy (amoxicillin 500 every 12, Cipro 500 daily) for additional 2 weeks pending infectious disease consult, he will continue with Santyl daily. He was reminded to continue with probiotic therapy while on oral antibiotics; he denies any GI disturbance. 01/03/18-He is here in follow-up evaluation for right great toe ulcer. He is decided to not undergo any vascular intervention at this time. He was established with [facility] infectious disease (Dr. Linus Salmons) last week initiated on vancomycin and cefazolin with dialysis treatment for 6 weeks. We will continue with current treatment plan with Santyl and offloading and he will follow-up in 2 weeks 01/17/18-He is seen in follow-up evaluation for right great toe ulcer. There is improvement in appearance with less nonviable tissue present. We will continue with same treatment plan he will follow-up next week. He is tolerating IV antibiotics without any complications 12/28/28-QM is seen in follow up evaluation for right great toe ulcer. There continues to be improvement. He is tolerating IV antibiotics with hemodialysis, completion date of 7/31. We will transition to collagen and and follow-up next week 02/07/18-He is here in follow up for a right great toe ulcer. There is red granulation tissue throughout the wound, improved  in appearance. He completed antibiotics yesterday. He saw podiatry last Thursday for nail trimming, at that appointment he periwound callus was trimmed and a culture was obtained; culture negative. He does not have a follow up appointment with infectious disease. We will submit for grafix. 02/14/18-He is here in follow-up evaluation for a right great toe ulcer. There is slow improvement, red granulation tissue throughout. The insurance approval for grafix is pending. We will continue with collagen and he'll follow-up next week 02/21/18-He is seen in follow-up evaluation for right great toe ulcer. There is improvement with red granulation tissue throughout. He was approved for oasis and this was placed today. He will follow up next week 02/28/18-He is seen in follow-up evaluation for right great toe ulcer. The wound is stable, oasis applied and he'll follow-up next week 03/07/18-He is seen in follow-up violation of her right great toe ulcer. The wound is dry, no evidence of drainage. The wound appears healed today. He was painted with Betadine, instructed to paint with Betadine daily, cover with band-aid for the next week and then cover with band-aid for the following week continue with surgical shoe. He was encouraged to contact the clinic with any evidence of drainage, or change in appearance to toe/wound. He will be discharged from wound care services Readmission: 07/14/18 on evaluation today patient presents for initial evaluation our clinic concerning issues that he is having with his left great toe. We previously saw him in 2019 for his right great toe which was actually worse at that time and subsequently was able to be completely healed in the end although it did take several months. With that being said the patient is currently having an area on his left great toe that occurred initially as result of a blister that came up he's not really sure why or how. Subsequently this was initially treated by  his podiatrist though the recommendation there was apparently amputation according to the patient and his daughter who was present with him today. With that being said the patient did not want to proceed with amputation and would like to try to perform wound care and get this to  heal without having to go down that road. We were able to do that with his other toe and so he would like to at least give that a try before going forward with amputation. Again I completely understand his concern in this regard. With that being said I did discuss with the patient obviously that it's always a chance that we cannot get this to heal with normal wound care measures but obviously we will give it our best shot. He does actually have an appointment scheduled for later today with the vascular specialist in order to evaluate his blood flow. That was at the recommendation of his podiatrist as well. Obviously if he is having good arterial flow and nonetheless is still experiencing issues with having the wound delay in healing then it may simply be a matter of we need to initiate more aggressive wound to therapy in order to see if we can get this to heal. No fevers, chills, [place], [place]. ([id number]) nausea, or vomiting noted at this time. The patient notes that this woman has been present for approximately three weeks. He is not a smoker. His most recent hemoglobin A1c was 6.2 on 06/18/19. 07/17/2019 on evaluation today patient appears unfortunately to not be doing as well. He did see vascular I did review the note as well. That was on 07/15/2019. Subsequently there can actually be taking him to the OR for an angiogram due to poor arterial flow into this extremity. Apparently the atherosclerotic changes were severe. The patient is stated to be at risk for limb loss. Nonetheless the good news is this is being scheduled fairly quickly he will have the surgery/procedure next Thursday. With that being said I am going also go  ahead based on the results of his x-ray which showed some chance of osteomyelitis in the distal portion of the toe get the patient set up for an MRI to further evaluate for the possibility of osteomyelitis obviously if he does have osteomyelitis we need to know so that we can address this appropriately. 07/29/2019 upon evaluation today patient appears to be doing better in general with regard to his toe ulcer. He does have much better blood flow you can actually feel a palpable pulse which appears to be very strong at this time. I did review his arterial intervention/angiogram which did show that he had evidence of sufficient arterial flow restoration at this point. He did have occlusions that were 60 and 80% that residually were only 10 and 25% with no limiting flow. This is excellent news and hopefully he will continue to show signs of improvement in light of the improved vascular status. With that being said the toe mainly shows somewhat of an eschar today I think that this is fairly stable and as such probably would be best not to really do much in the way of debridement at this point I do believe Betadine would likely be a good option. 08/07/2019 on evaluation today patient actually appears to be doing about the same. The one difference is the eschar that we were just seeing if we can maintain is actually starting to loosen up and leak around the edges I think it is time to actually go ahead and remove this today. Is also no longer stable is very soft. Nonetheless the patient is okay with proceeding with debridement at this point. Fortunately there is no signs of active infection. No fevers, chills, nausea, vomiting, or diarrhea. The patient states that he took his last antibiotic that  he had last night. He would need a refill as he will not be seeing infectious disease until February 2. Nonetheless obviously I do not want him to have any break in therapy especially when he is doing as well as he is  currently. 08/14/2019 on evaluation today patient appears to be doing quite well all things considered in regard to his toe. I do feel like he is making progress and though this is not completely healed by any means we still have some ways to go I feel like he is making progress which is excellent. There is no sign of active infection at this time systemically. He does have osteomyelitis of the distal phalanx of his great toe on the left. He did see Dr. Linus Salmons. That is the infectious disease doctor in [place] where he was referred. Dr. Linus Salmons apparently according to the note did not feel like the patient's MRI revealed osteomyelitis but just reactive marrow and subsequently did not recommend any antibiotic therapy whatsoever. To be peripherally honest I am not really in agreement with this based on the MRI results and what we are seeing and I think he has been responding at least decently well to oral medication as well. Nonetheless we may want to check into the possibility of IV antibiotics being administered at the dialysis center. I will discuss this with Dr. Dellia Nims further. 08/21/2019 upon evaluation today patient appears at this point to be making some progress with regard to his wounds. He has been tolerating the dressing changes without complication. Fortunately there is no evidence of active infection at this time. Overall I feel like he is doing well with the oral antibiotics I discussed with Dr. Dellia Nims the possibility of doing IV antibiotic therapy versus continuing with the oral. His opinion was if the patient is doing well with oral to maybe stick with that. Obviously we can initiate additional Cipro as well just to help ensure that there is nothing worsening here. The Cipro should be beneficial for him as well. That helps cover more gram-negative's were is at the doxycycline is more gram- positive's. 09/02/2019 on evaluation today patient appears to be doing fairly well upon inspection  today. With that being said though the patient is continuing to do well he does also continue to develop issues with buildup of necrotic tissue on the distal portion of his toe he does have good arterial flow however. For that reason I am going to suggest that what we may need to do is consider using something such as Santyl to try to keep this free and clear. Also discussed in greater detail HBO therapy with him today please see plan for additional recommendations and results of this discussion. 09/11/2019 upon evaluation today patient appears to be doing well with regard to his toe ulcer. This actually appears to be much better today using the Santyl than it was previous. Fortunately there is no signs of active infection. No fevers, chills, nausea, vomiting, or diarrhea. 09/18/2019 upon evaluation today patient appears to be doing much better in regard to his wound in general. The Santyl was doing a good job at loosening stuff up here for [place] which is excellent news. With that being said he is still taking the oral antibiotic. I sent this in last for him on 09/03/2019. This was the doxycycline. When this runs out he will actually be complete as far as the treatment is concerned. With that being said I am getting continue the Cipro for 1 additional month.  Overall his wound seems to be doing quite well. 09/25/2019 upon evaluation today patient appears to be doing well with regard to his toe ulcer. This is measuring much better and overall seems to be making good progress. Fortunately there is no signs of active infection at this time. No fevers, chills, nausea, vomiting, or diarrhea. 10/02/2019 upon evaluation today patient appears to be doing well with regard to his wound. He has been tolerating the dressing changes without complication. Fortunately there is no signs of active infection at this time. No fevers, chills, nausea, vomiting, or diarrhea. Overall I am extremely pleased with how things seem to be  progressing. I think the wound bed is at the point now where we can likely proceed with additional measures to try to get this healed more quickly I think Dermagraft may be a good possibility for the patient. We can definitely look into seeing about approval for this. 10/09/2019 upon evaluation today patient's wound actually appears to be doing well although it still is progressing somewhat slowly compared to what I would like to see. With that being said I did order Apligraf for the patient which I felt would keep the wound nice and moist without allowing it to dry out and hopefully allow this to heal much more effectively and quickly. The patient actually did obtain approval and therefore we can apply that today. 10/23/2019 upon evaluation today patient's wound actually appears to be doing significantly better with regard to his toe ulcer. The tissue at the base of the wound is greatly improved and overall I am very pleased with the progress made. We did use Apligraf 2 weeks ago on the toe that did excellent for him much it has completely dissolved after as of the 2 weeks. They only had to change the dressing a few times due to drainage through. Obviously they left the Steri-Strips and Mepitel in place. Unfortunately we do not have the Apligraf available today for application regard therefore have to use a different product here in the clinic today and then order the Apligraf for next Thursday. That will be his second application. 4/22; patient arrived with overhanging skin and subcutaneous tissue on the wound margins this was removed. Apligraf #2 applied 11/13/2019 upon evaluation today patient actually appears to be doing well with regard to his foot ulcer. He has been tolerating the dressing changes without complication. The overall wound bed appears to be healthier and has filled in as well as not nearly as deep as it was the tissue is also better quality. Overall I am extremely pleased with how  things seem to be progressing. The patient is likewise very happy in this regard. Timothy Gibson, Timothy Gibson ([id number]) Electronic Signature(s) Signed: 11/13/2019 9:18:26 AM By: Worthy Keeler PA-C Entered By: Worthy Keeler on 11/13/2019 09:18:26 Timothy Gibson, Timothy Gibson ([id number]) -------------------------------------------------------------------------------- Physical Exam Details Patient Name: Timothy Gibson Date of Service: 11/13/2019 8:45 AM Medical Record Number: [id number] Patient Account Number: [id number] Date of Birth/Sex: Jan 07, 1951 (68 y.o. M) Treating RN: Army Melia Primary Care Provider: Merrie Roof Other Clinician: Referring Provider: Merrie Roof Treating Provider/Extender: [name], Belina Mandile Weeks in Treatment: 60 Constitutional Well-nourished and well-hydrated in no acute distress. Respiratory normal breathing without difficulty. Psychiatric this patient is able to make decisions and demonstrates good insight into disease process. Alert and Oriented x 3. pleasant and cooperative. Notes Upon inspection patient appears to be doing well with regard to his wound. This did require some sharp debridement today in preparation for application  of the Apligraf. He actually tolerated debridement with minimal bleeding compared to normal and subsequently I was able to prepare the wound bed very well. Post debridement the wound bed appears to be doing much better which is great news. This is Apligraf #3. Electronic Signature(s) Signed: 11/13/2019 4:50:08 PM By: Worthy Keeler PA-C Entered By: Worthy Keeler on 11/13/2019 16:50:08 Timothy Gibson, Timothy Gibson ([id number]) -------------------------------------------------------------------------------- Physician Orders Details Patient Name: Timothy Gibson Date of Service: 11/13/2019 8:45 AM Medical Record Number: [id number] Patient Account Number: [id number] Date of Birth/Sex: Feb 04, 1951 (68 y.o. M) Treating RN: Army Melia Primary Care Provider:  Merrie Roof Other Clinician: Referring Provider: Merrie Roof Treating Provider/Extender: Melburn Hake, Keiko Myricks Weeks in Treatment: 105 Verbal / Phone Orders: No Diagnosis Coding ICD-10 Coding Code Description 5793978841 Other chronic osteomyelitis, left ankle and foot E11.621 Type 2 diabetes mellitus with foot ulcer L97.522 Non-pressure chronic ulcer of other part of left foot with fat layer exposed I10 Essential (primary) hypertension Wound Cleansing Wound #3 Left Toe Great o Dial antibacterial soap, wash wounds, rinse and pat dry prior to dressing wounds o May Shower, gently pat wound dry prior to applying new dressing. Anesthetic (add to Medication List) Wound #3 Left Toe Great o Topical Lidocaine 4% cream applied to wound bed prior to debridement (In Clinic Only). Primary Wound Dressing Wound #3 Left Toe Great o Other: - Apligraft applied Secondary Dressing Wound #3 Left Toe Great o Conform/Kerlix o Drawtex Dressing Change Frequency Wound #3 Left Toe Great o Other: - As needed Follow-up Appointments Wound #3 Left Toe Great o Return Appointment in 2 weeks. Advanced Therapies Wound #3 Left Toe Great o Apligraf application in clinic; including contact layer, fixation with steri strips, dry gauze and cover dressing. Electronic Signature(s) Signed: 11/13/2019 9:18:35 AM By: Army Melia Signed: 11/13/2019 4:59:38 PM By: Worthy Keeler PA-C Entered By: Army Melia on 11/13/2019 09:00:41 Timothy Gibson, Timothy Gibson ([id number]) -------------------------------------------------------------------------------- Problem List Details Patient Name: Timothy Gibson Date of Service: 11/13/2019 8:45 AM Medical Record Number: [id number] Patient Account Number: [id number] Date of Birth/Sex: October 21, 1950 (69 y.o. M) Treating RN: Army Melia Primary Care Provider: Merrie Roof Other Clinician: Referring Provider: Merrie Roof Treating Provider/Extender: Melburn Hake, Solmon Bohr Weeks in Treatment:  17 Active Problems ICD-10 Encounter Code Description Active Date MDM Diagnosis [id number] Other chronic osteomyelitis, left ankle and foot 07/29/2019 No Yes E11.621 Type 2 diabetes mellitus with foot ulcer 07/15/2019 No Yes L97.522 Non-pressure chronic ulcer of other part of left foot with fat layer 07/15/2019 No Yes exposed [place] (primary) hypertension 07/15/2019 No Yes Inactive Problems Resolved Problems Electronic Signature(s) Signed: 11/13/2019 8:47:52 AM By: Worthy Keeler PA-C Entered By: Worthy Keeler on 11/13/2019 08:47:51 Timothy Gibson ([id number]) -------------------------------------------------------------------------------- Progress Note Details Patient Name: Timothy Gibson Date of Service: 11/13/2019 8:45 AM Medical Record Number: [id number] Patient Account Number: [id number] Date of Birth/Sex: 07-23-1950 (68 y.o. M) Treating RN: Army Melia Primary Care Provider: Merrie Roof Other Clinician: Referring Provider: Merrie Roof Treating Provider/Extender: Melburn Hake, Dayson Aboud Weeks in Treatment: 17 Subjective Chief Complaint Information obtained from Patient Left Great Toe History of Present Illness (HPI) 11/27/17 on evaluation today patient presents for initial evaluation concerning an issue that he has been having with his right great toe which began last Thursday 11/22/17. He has a history of diabetes mellitus type to which he has had for greater than 30 years currently he is on insulin. Subsequently he also has a history of hypertension. On physical exam inspection is also appears  he may have some peripheral vascular disease with his ABI's being noncompressible bilaterally. He is on dialysis as well and this is on Monday, Wednesday, and Friday. Patient was hospitalized back in January/February 2019 although this was more for stomach/back pain though they never told me exactly what was going on. This is according to the patient. Subsequently the ulcer which is between  the third and fourth toe when space of the right foot as well is on the plantar aspect of the fourth toe is due to him having cleaned his toes this morning and he states that his finger which is large split the toe tissue in between causing the wounds that we currently see. With that being said he states this has happened before I explained I would definitely recommend that he not do this any longer. He can use a small soft washcloth to clean between the toes without causing trauma. Subsequently the right great toe hatchery does show evidence of necrotic tissue present on the surface of the wound specifically there is callous and dead skin surrounding which is trapping fluid causing problems as far as the wound is concerned. The surface of the wound does show slough although due to his vascular flow I'm not going to sharply debride this today I think I will selectively debride away the necrotic skin as well is callous from around the surrounding so this will not continue to be a moisture issue. Nonetheless the patient has no pain he does have diabetic neuropathy. 12/06/17-He is here in follow-up evaluation for a right great toe ulcer. He is voicing no complaints or concerns. He continues to infrequently/socially smokes cigars with no desire to quit. He has been compliant with offloading, using open toed surgical shoe; has been compliant using Santyl daily. He continues on clindamycin although takes it inconsistently secondary to indigestion. The plain film x-ray performed on 5/23 impression: Soft tissue swelling of the left first toe and is noted with probable irregular lucency involving distal tuft of first distal phalanx concerning for osteomyelitis. MRI may be performed for further evaluation; MRI ordered. The vascular evaluation performed on 5/24 field non- compressible ABIs bilaterally and reduced TBI bilaterally suggesting significant tibial disease. He will be referred to vascular medicine for  further evaluation. 12/13/17-He is here in follow-up evaluation for right great toe ulcer. He has appointment next Thursday for the MRI and vascular evaluation. Wound culture obtained last week grew abundant Klebsiella oxytoca (multidrug sensitivity), and abundant enteric coccus faecalis (sensitive to ampicillin). Ampicillin and Cipro were called in, along with a probiotic. In light of his appointments next Thursday he will follow-up in 2 weeks, we will continue with Santyl 12/27/17-He is here for evaluation for a right great toe ulcer. 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 He did have evaluation by vascular medicine, Dr. Ronalee Belts, on 6/13, at that appointment it was decided for him to undergo angiography with possible intervention but he refused and is still considering. If he chooses to have it done he will contact the office. He has not heard back from either ID offices that he was referred to two weeks ago: [place] and [place]. there is improvement in both appearance and measurement to the ulcer. We will extend antibiotic therapy (amoxicillin 500 every 12, Cipro 500 daily) for additional 2 weeks pending infectious disease consult, he will continue with Santyl daily. He was reminded to continue with probiotic therapy while on oral antibiotics;  he denies any GI disturbance. 01/03/18-He is here in follow-up evaluation for right great toe ulcer. He is decided to not undergo any vascular intervention at this time. He was established with [facility] infectious disease (Dr. Linus Salmons) last week initiated on vancomycin and cefazolin with dialysis treatment for 6 weeks. We will continue with current treatment plan with Santyl and offloading and he will follow-up in 2 weeks 01/17/18-He is seen in follow-up evaluation for right great toe ulcer. There is improvement in appearance with less nonviable tissue present. We will continue  with same treatment plan he will follow-up next week. He is tolerating IV antibiotics without any complications 08/30/23-[place] is seen in follow up evaluation for right great toe ulcer. There continues to be improvement. He is tolerating IV antibiotics with hemodialysis, completion date of 7/31. We will transition to collagen and and follow-up next week 02/07/18-He is here in follow up for a right great toe ulcer. There is red granulation tissue throughout the wound, improved in appearance. He completed antibiotics yesterday. He saw podiatry last Thursday for nail trimming, at that appointment he periwound callus was trimmed and a culture was obtained; culture negative. He does not have a follow up appointment with infectious disease. We will submit for grafix. 02/14/18-He is here in follow-up evaluation for a right great toe ulcer. There is slow improvement, red granulation tissue throughout. The insurance approval for grafix is pending. We will continue with collagen and he'll follow-up next week 02/21/18-He is seen in follow-up evaluation for right great toe ulcer. There is improvement with red granulation tissue throughout. He was approved for oasis and this was placed today. He will follow up next week 02/28/18-He is seen in follow-up evaluation for right great toe ulcer. The wound is stable, oasis applied and he'll follow-up next week 03/07/18-He is seen in follow-up violation of her right great toe ulcer. The wound is dry, no evidence of drainage. The wound appears healed today. He was painted with Betadine, instructed to paint with Betadine daily, cover with band-aid for the next week and then cover with band-aid for the following week continue with surgical shoe. He was encouraged to contact the clinic with any evidence of drainage, or change in appearance to toe/wound. He will be discharged from wound care services Readmission: 07/14/18 on evaluation today patient presents for initial evaluation our  clinic concerning issues that he is having with his left great toe. We previously saw him in 2019 for his right great toe which was actually worse at that time and subsequently was able to be completely healed in the end although it did take several months. With that being said the patient is currently having an area on his left great toe that occurred initially as result of a blister that came up he's not really sure why or how. Subsequently this was initially treated by his podiatrist though the recommendation there was apparently amputation according to the patient and his daughter who was present with him today. With that being said the patient did not want to proceed with amputation and would like to try to perform wound care and get this to heal without having to go down that road. We were able to do that with his other toe and so he would like to at least give that a try before going forward with amputation. AMAAR, Timothy Gibson ([id number]) Again I completely understand his concern in this regard. With that being said I did discuss with the patient obviously that it's always a  chance that we cannot get this to heal with normal wound care measures but obviously we will give it our best shot. He does actually have an appointment scheduled for later today with the vascular specialist in order to evaluate his blood flow. That was at the recommendation of his podiatrist as well. Obviously if he is having good arterial flow and nonetheless is still experiencing issues with having the wound delay in healing then it may simply be a matter of we need to initiate more aggressive wound to therapy in order to see if we can get this to heal. No fevers, chills, nausea, or vomiting noted at this time. The patient notes that this woman has been present for approximately three weeks. He is not a smoker. His most recent hemoglobin A1c was 6.2 on 06/18/19. 07/17/2019 on evaluation today patient appears unfortunately  to not be doing as well. He did see vascular I did review the note as well. That was on 07/15/2019. Subsequently there can actually be taking him to the OR for an angiogram due to poor arterial flow into this extremity. Apparently the atherosclerotic changes were severe. The patient is stated to be at risk for limb loss. Nonetheless the good news is this is being scheduled fairly quickly he will have the surgery/procedure next Thursday. With that being said I am going also go ahead based on the results of his x-ray which showed some chance of osteomyelitis in the distal portion of the toe get the patient set up for an MRI to further evaluate for the possibility of osteomyelitis obviously if he does have osteomyelitis we need to know so that we can address this appropriately. 07/29/2019 upon evaluation today patient appears to be doing better in general with regard to his toe ulcer. He does have much better blood flow you can actually feel a palpable pulse which appears to be very strong at this time. I did review his arterial intervention/angiogram which did show that he had evidence of sufficient arterial flow restoration at this point. He did have occlusions that were 60 and 80% that residually were only 10 and 25% with no limiting flow. This is excellent news and hopefully he will continue to show signs of improvement in light of the improved vascular status. With that being said the toe mainly shows somewhat of an eschar today I think that this is fairly stable and as such probably would be best not to really do much in the way of debridement at this point I do believe Betadine would likely be a good option. 08/07/2019 on evaluation today patient actually appears to be doing about the same. The one difference is the eschar that we were just seeing if we can maintain is actually starting to loosen up and leak around the edges I think it is time to actually go ahead and remove this today. Is also no  longer stable is very soft. Nonetheless the patient is okay with proceeding with debridement at this point. Fortunately there is no signs of active infection. No fevers, chills, nausea, vomiting, or diarrhea. The patient states that he took his last antibiotic that he had last night. He would need a refill as he will not be seeing infectious disease until February 2. Nonetheless obviously I do not want him to have any break in therapy especially when he is doing as well as he is currently. 08/14/2019 on evaluation today patient appears to be doing quite well all things considered in regard to his  toe. I do feel like he is making progress and though this is not completely healed by any means we still have some ways to go I feel like he is making progress which is excellent. There is no sign of active infection at this time systemically. He does have osteomyelitis of the distal phalanx of his great toe on the left. He did see Dr. Linus Salmons. That is the infectious disease doctor in [place] where he was referred. Dr. Linus Salmons apparently according to the note did not feel like the patient's MRI revealed osteomyelitis but just reactive marrow and subsequently did not recommend any antibiotic therapy whatsoever. To be peripherally honest I am not really in agreement with this based on the MRI results and what we are seeing and I think he has been responding at least decently well to oral medication as well. Nonetheless we may want to check into the possibility of IV antibiotics being administered at the dialysis center. I will discuss this with Dr. Dellia Nims further. 08/21/2019 upon evaluation today patient appears at this point to be making some progress with regard to his wounds. He has been tolerating the dressing changes without complication. Fortunately there is no evidence of active infection at this time. Overall I feel like he is doing well with the oral antibiotics I discussed with Dr. Dellia Nims the possibility  of doing IV antibiotic therapy versus continuing with the oral. His opinion was if the patient is doing well with oral to maybe stick with that. Obviously we can initiate additional Cipro as well just to help ensure that there is nothing worsening here. The Cipro should be beneficial for him as well. That helps cover more gram-negative's were is at the doxycycline is more gram- positive's. 09/02/2019 on evaluation today patient appears to be doing fairly well upon inspection today. With that being said though the patient is continuing to do well he does also continue to develop issues with buildup of necrotic tissue on the distal portion of his toe he does have good arterial flow however. For that reason I am going to suggest that what we may need to do is consider using something such as Santyl to try to keep this free and clear. Also discussed in greater detail HBO therapy with him today please see plan for additional recommendations and results of this discussion. 09/11/2019 upon evaluation today patient appears to be doing well with regard to his toe ulcer. This actually appears to be much better today using the Santyl than it was previous. Fortunately there is no signs of active infection. No fevers, chills, nausea, vomiting, or diarrhea. 09/18/2019 upon evaluation today patient appears to be doing much better in regard to his wound in general. The Santyl was doing a good job at loosening stuff up here for [place] which is excellent news. With that being said he is still taking the oral antibiotic. I sent this in last for him on 09/03/2019. This was the doxycycline. When this runs out he will actually be complete as far as the treatment is concerned. With that being said I am getting continue the Cipro for 1 additional month. Overall his wound seems to be doing quite well. 09/25/2019 upon evaluation today patient appears to be doing well with regard to his toe ulcer. This is measuring much better and  overall seems to be making good progress. Fortunately there is no signs of active infection at this time. No fevers, chills, nausea, vomiting, or diarrhea. 10/02/2019 upon evaluation today patient appears  to be doing well with regard to his wound. He has been tolerating the dressing changes without complication. Fortunately there is no signs of active infection at this time. No fevers, chills, nausea, vomiting, or diarrhea. Overall I am extremely pleased with how things seem to be progressing. I think the wound bed is at the point now where we can likely proceed with additional measures to try to get this healed more quickly I think Dermagraft may be a good possibility for the patient. We can definitely look into seeing about approval for this. 10/09/2019 upon evaluation today patient's wound actually appears to be doing well although it still is progressing somewhat slowly compared to what I would like to see. With that being said I did order Apligraf for the patient which I felt would keep the wound nice and moist without allowing it to dry out and hopefully allow this to heal much more effectively and quickly. The patient actually did obtain approval and therefore we can apply that today. 10/23/2019 upon evaluation today patient's wound actually appears to be doing significantly better with regard to his toe ulcer. The tissue at the base of the wound is greatly improved and overall I am very pleased with the progress made. We did use Apligraf 2 weeks ago on the toe that did excellent for him much it has completely dissolved after as of the 2 weeks. They only had to change the dressing a few times due to drainage through. Obviously they left the Steri-Strips and Mepitel in place. Unfortunately we do not have the Apligraf available today for application regard therefore have to use a different product here in the clinic today and then order the Apligraf for next Thursday. That will be his second  application. 4/22; patient arrived with overhanging skin and subcutaneous tissue on the wound margins this was removed. Apligraf #2 applied Timothy Gibson, Timothy Gibson ([id number]) 11/13/2019 upon evaluation today patient actually appears to be doing well with regard to his foot ulcer. He has been tolerating the dressing changes without complication. The overall wound bed appears to be healthier and has filled in as well as not nearly as deep as it was the tissue is also better quality. Overall I am extremely pleased with how things seem to be progressing. The patient is likewise very happy in this regard. Objective Constitutional Well-nourished and well-hydrated in no acute distress. Vitals Time Taken: 8:39 AM, Height: 76 in, Weight: 244 lbs, BMI: 29.7, Temperature: 98.4 F, Pulse: 74 bpm, Respiratory Rate: 16 breaths/min, Blood Pressure: 196/90 mmHg. Respiratory normal breathing without difficulty. Psychiatric this patient is able to make decisions and demonstrates good insight into disease process. Alert and Oriented x 3. pleasant and cooperative. General Notes: Upon inspection patient appears to be doing well with regard to his wound. This did require some sharp debridement today in preparation for application of the Apligraf. He actually tolerated debridement with minimal bleeding compared to normal and subsequently I was able to prepare the wound bed very well. Post debridement the wound bed appears to be doing much better which is great news. This is Apligraf #3. Integumentary (Hair, Skin) Wound #3 status is Open. Original cause of wound was Gradually Appeared. The wound is located on the Left Toe Great. The wound measures 1.4cm length x 0.8cm width x 0.3cm depth; 0.88cm^2 area and 0.264cm^3 volume. There is Fat Layer (Subcutaneous Tissue) Exposed exposed. There is no tunneling or undermining noted. There is a medium amount of serous drainage noted. The wound margin  is flat and intact. There  is large (67-100%) pink granulation within the wound bed. There is a small (1-33%) amount of necrotic tissue within the wound bed including Adherent Slough. 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 There was a Excisional Skin/Subcutaneous Tissue Debridement with a total area of 1.12 sq cm performed by [name], Aurelius Gildersleeve E., PA-C. With the following instrument(s): Curette to remove Viable and Non-Viable tissue/material. Material removed includes Callus, Subcutaneous Tissue, and Slough after achieving pain control using Lidocaine. A time out was conducted at 08:49, prior to the start of the procedure. A Minimum amount of bleeding was controlled with Pressure. The procedure was tolerated well. Post Debridement Measurements: 1.4cm length x 0.8cm width x 0.3cm depth; 0.264cm^3 volume. Character of Wound/Ulcer Post Debridement is stable. Severity of Tissue Post Debridement is: Fat layer exposed. Post procedure Diagnosis Wound #3: Same as Pre-Procedure Pre-procedure diagnosis of Wound #3 is a Diabetic Wound/Ulcer of the Lower Extremity located on the Left Toe Great. A skin graft procedure using a bioengineered skin substitute/cellular or tissue based product was performed by [name], Nahzir Pohle E., PA-C with the following instrument (s): Forceps and Scissors. Apligraf was applied and secured with Steri-Strips. 22 sq cm of product was utilized and 22 sq cm was wasted due to wound size. Post Application, mepitel one was applied. A Time Out was conducted at 08:52, prior to the start of the procedure. The procedure was Timothy Gibson, Timothy Gibson ([id number]) tolerated well. Post procedure Diagnosis Wound #3: Same as Pre-Procedure . Plan Wound Cleansing: Wound #3 Left Toe Great: Dial antibacterial soap, wash wounds, rinse and pat dry prior to dressing wounds May Shower, gently pat wound dry prior to applying new dressing. Anesthetic (add to Medication List): Wound #3 Left Toe Great: Topical Lidocaine 4% cream applied to wound bed prior to debridement (In Clinic Only). Primary Wound Dressing: Wound #3 Left Toe Great: Other: - Apligraft applied Secondary Dressing: Wound #3 Left Toe Great: Conform/Kerlix Drawtex Dressing Change Frequency: Wound #3 Left Toe Great: Other: - As needed Follow-up Appointments: Wound #3 Left Toe Great: Return Appointment in 2 weeks. Advanced Therapies: Wound #3 Left Toe Great: Apligraf application in clinic; including contact layer, fixation with steri strips, dry gauze and cover dressing. 1. My suggestion currently is can be that we go ahead and continue with the current measures with regard to the Apligraf that was reapplied today he seems to be doing excellent there is no signs of infection. 2. I am also can recommend at this time that we continue with drawtex over top of the dressing as a bolster. 3. I am going to suggest as well that he change the outer dressing as needed but maintain the inner dressing as far as keeping the Apligraf in place. We will see patient back for reevaluation in 2 weeks here in the clinic. If anything worsens or changes patient will contact our office for additional recommendations. Electronic Signature(s) Signed: 11/13/2019 4:50:44 PM By: Worthy Keeler PA-C Entered By: Worthy Keeler on 11/13/2019 16:50:44 Timothy Gibson, Timothy Gibson ([id number]) -------------------------------------------------------------------------------- SuperBill Details Patient Name: Timothy Gibson Date of Service: 11/13/2019 Medical Record Number: [id number] Patient Account  Number:  [id number] Date of Birth/Sex: [birth year] (68 y.o. 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 I10 Essential (primary) hypertension Facility Procedures CPT4 Code: [id number] Description: [phone] (Facility Use Only) Apligraf 44 SQ CM Modifier: Quantity: Merrick Code: [id number] Description: 19622 - SKIN SUB GRAFT FACE/NK/HF/G Modifier: Quantity: 1 CPT4 Code: Description: ICD-10 Diagnosis Description L97.522 Non-pressure chronic ulcer of other part of left foot with fat layer expo Modifier: sed Quantity: Physician Procedures CPT4 Code: [id number] Description: 11941 - WC PHYS SKIN SUB GRAFT FACE/NK/HF/G Modifier: Quantity: 1 CPT4 Code: Description: ICD-10 Diagnosis Description L97.522 Non-pressure chronic ulcer of other part of left foot with fat layer expos Modifier: ed Quantity: Electronic Signature(s) Signed: 11/13/2019 4:50:57 PM By: Worthy Keeler PA-C Entered By: Worthy Keeler on 11/13/2019 16:50:56

## 2019-11-13 NOTE — Progress Notes (Signed)
UGOCHUKWU, CHICHESTER ([id number]) Visit Report for 11/13/2019 Arrival Information Details Patient Name: Timothy Gibson, Timothy Gibson Date of Service: 11/13/2019 8:45 AM Medical Record Number: [id number] Patient Account Number: [id number] Date of Birth/Sex: 11/09/1950 (69 y.o. M) Treating RN: Montey Hora Primary Care Dior Stepter: Merrie Roof Other Clinician: Referring Kendi Defalco: Merrie Roof Treating Rhonda Linan/Extender: Melburn Hake, [name] Weeks in Treatment: 25 Visit Information History Since Last Visit Added or deleted any medications: No Patient Arrived: Ambulatory Any new allergies or adverse reactions: No Arrival Time: 08:37 Had a fall or experienced change in No Accompanied By: caregiver activities of daily living that may affect Transfer Assistance: None risk of falls: Patient Identification Verified: Yes Signs or symptoms of abuse/neglect since last visito No Secondary Verification Process Completed: Yes Hospitalized since last visit: No Patient Requires Transmission-Based Precautions: No Implantable device outside of the clinic excluding No cellular tissue based products placed in the center since last visit: Has Dressing in Place as Prescribed: Yes Pain Present Now: No Electronic Signature(s) Signed: 11/13/2019 4:19:43 PM By: Montey Hora Entered By: Montey Hora on 11/13/2019 08:38:49 Timothy Gibson ([id number]) -------------------------------------------------------------------------------- Encounter Discharge Information Details Patient Name: Timothy Gibson Date of Service: 11/13/2019 8:45 AM Medical Record Number: [id number] Patient Account Number: [id number] Date of Birth/Sex: 12/28/1950 (69 y.o. M) Treating RN: Army Melia Primary Care Kaylyne Axton: Merrie Roof Other Clinician: Referring Maleki Hippe: Merrie Roof Treating Shirlette Scarber/Extender: Melburn Hake, [name] Weeks in Treatment: 44 Encounter Discharge Information Items Post Procedure Vitals Discharge Condition: Stable Temperature  (F): 98.4 Ambulatory Status: Ambulatory Pulse (bpm): 74 Discharge Destination: Home Respiratory Rate (breaths/min): 16 Transportation: Private Auto Blood Pressure (mmHg): 196/90 Accompanied By: family Schedule Follow-up Appointment: Yes Clinical Summary of Care: Electronic Signature(s) Signed: 11/13/2019 9:18:35 AM By: Army Melia Entered By: Army Melia on 11/13/2019 09:01:34 Timothy Gibson ([id number]) -------------------------------------------------------------------------------- Lower Extremity Assessment Details Patient Name: Timothy Gibson Date of Service: 11/13/2019 8:45 AM Medical Record Number: [id number] Patient Account Number: [id number] Date of Birth/Sex: 1950/08/26 (68 y.o. M) Treating RN: Montey Hora Primary Care Mehdi Gironda: Merrie Roof Other Clinician: Referring Yarrow Linhart: Merrie Roof Treating Yang Rack/Extender: Melburn Hake, [name] Weeks in Treatment: 17 Edema Assessment Assessed: [Left: No] [Right: No] Edema: [Left: Ye] [Right: s] Vascular Assessment Pulses: Dorsalis Pedis Palpable: [Left:Yes] Electronic Signature(s) Signed: 11/13/2019 4:19:43 PM By: Montey Hora Entered By: Montey Hora on 11/13/2019 08:45:36 Timothy Gibson ([id number]) -------------------------------------------------------------------------------- Multi Wound Chart Details Patient Name: Timothy Gibson Date of Service: 11/13/2019 8:45 AM Medical Record Number: [id number] Patient Account Number: [id number] Date of Birth/Sex: 1951/03/11 (69 y.o. 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 Comorbid History: Lymphedema, Arrhythmia, N/A N/A Hypertension, Type II Diabetes, End Stage Renal Disease, Rheumatoid Arthritis, Osteoarthritis, Confinement Anxiety Date Acquired: 06/19/2019 N/A N/A Weeks of Treatment: 17 N/A N/A Wound Status: Open N/A N/A Measurements L x W x D (cm) 1.4x0.8x0.3 N/A N/A Area (cm) : 0.88 N/A N/A Volume (cm) : 0.264 N/A N/A % Reduction in Area: 67.50% N/A N/A % Reduction in Volume: 2.60% N/A N/A Classification: Grade 2 N/A N/A Exudate Amount: Medium N/A N/A Exudate Type: Serous N/A N/A Exudate Color: amber N/A N/A Wound Margin: Flat and Intact N/A  N/A Granulation Amount: Large (67-100%) N/A N/A Granulation Quality: Pink N/A N/A Necrotic Amount: Small (1-33%) N/A N/A Exposed Structures: Fat Layer (Subcutaneous Tissue) N/A N/A Exposed: Yes Fascia: No Tendon: No Muscle: No Joint: No Bone: No Epithelialization: Small (1-33%) N/A N/A Treatment Notes Electronic Signature(s) Signed: 11/13/2019 9:18:35 AM By: Army Melia Entered By: Army Melia on 11/13/2019 08:49:40 COVE, HAYDON ([id number]) HIREN, PEPLINSKI ([id number]) -------------------------------------------------------------------------------- [place] Details Patient Name: Timothy Gibson Date of Service: 11/13/2019 8:45 AM Medical Record Number: [id number] Patient Account Number: [id number] Date of Birth/Sex: [birth year] (69 y.o. M) Treating RN: Army Melia Primary Care Ahana Najera: Merrie Roof Other Clinician: Referring Cherise Fedder: Merrie Roof Treating Humberto Addo/Extender: Melburn Hake, [name] Weeks in Treatment: 7 Active Inactive Abuse / Safety / Falls / Self Care Management Nursing Diagnoses: Potential for falls Goals: Patient will not experience any injury related to falls Date Initiated: 07/15/2019 Target Resolution Date: 10/18/2019 Goal Status:  Active Interventions: Assess fall risk on admission and as needed Notes: Necrotic Tissue Nursing Diagnoses: Impaired tissue integrity related to necrotic/devitalized tissue Goals: Necrotic/devitalized tissue will be minimized in the wound bed Date Initiated: 07/15/2019 Target Resolution Date: 10/18/2019 Goal Status: Active Interventions: Provide education on necrotic tissue and debridement process Notes: Orientation to the Wound Care Program Nursing Diagnoses: Knowledge deficit related to the wound healing center program Goals: Patient/caregiver will verbalize understanding of the [facility] Date Initiated: 07/15/2019 Target Resolution Date: 10/18/2019 Goal Status: Active Interventions: Provide education on orientation to the wound center Notes: Peripheral Neuropathy Nursing Diagnoses: Knowledge deficit related to disease process and management of peripheral neurovascular dysfunction Goals: Patient/caregiver will verbalize understanding of disease process and disease management Date Initiated: 07/15/2019 Target Resolution Date: 10/18/2019 Goal Status: Active FREMON, ZACHARIA ([id number]) Interventions: Assess signs and symptoms of neuropathy upon admission and as needed Provide education on Management of Neuropathy and Related Ulcers Notes: Wound/Skin Impairment Nursing Diagnoses: Impaired tissue integrity Goals: Ulcer/skin breakdown will heal within 14 weeks Date Initiated: 07/15/2019 Target Resolution Date: 10/18/2019 Goal Status: Active Interventions: Assess patient/caregiver ability to obtain necessary supplies Assess patient/caregiver ability to perform ulcer/skin care regimen upon admission and as needed Assess ulceration(s) every visit Notes: Electronic Signature(s) Signed: 11/13/2019 9:18:35 AM By: Army Melia Entered By: Army Melia on 11/13/2019 08:49:30 BREK, REECE  ([id number]) -------------------------------------------------------------------------------- Pain Assessment Details Patient Name: Timothy Gibson Date of Service: 11/13/2019 8:45 AM Medical Record Number: [id number] Patient Account Number: [id number] Date of Birth/Sex: Jan 29, 1951 (69 y.o. M) Treating RN: Montey Hora Primary Care [place] Difranco: Merrie Roof Other Clinician: Referring Loree Shehata: Merrie Roof Treating Cherron Blitzer/Extender: Melburn Hake, [name] Weeks in Treatment: 17 Active Problems Location of Pain Severity and Description of Pain Patient Has Paino No Site Locations Pain Management and Medication Current Pain Management: Electronic Signature(s) Signed: 11/13/2019 4:19:43 PM By: Montey Hora Entered By: Montey Hora on 11/13/2019 08:38:58 Timothy Gibson ([id number]) -------------------------------------------------------------------------------- Patient/Caregiver Education Details Patient Name: Timothy Gibson Date of Service: 11/13/2019 8:45 AM Medical Record Number: [id number] Patient Account Number: [id number] Date of Birth/Gender: [birth year] (69 y.o. M) Treating RN: Army Melia Primary Care Physician: Merrie Roof Other Clinician: Referring Physician: Merrie Roof Treating Physician/Extender: Sharalyn Ink in Treatment: 17 Education Assessment Education Provided To: Patient Education Topics Provided Wound/Skin Impairment: Handouts: Caring for Your Ulcer Methods: Demonstration, Explain/Verbal Responses: State content correctly Electronic Signature(s) Signed: 11/13/2019 9:18:35 AM By: Army Melia Entered By: Army Melia on 11/13/2019 09:00:56 DIONEL, ARCHEY ([id number]) -------------------------------------------------------------------------------- Wound Assessment Details Patient Name: Timothy Gibson. Date of  Service: 11/13/2019 8:45 AM Medical Record Number: [id number] Patient Account Number: [id number] Date of Birth/Sex: 1950/11/14 (69 y.o.  M) Treating RN: Montey Hora Primary Care Khyan Oats: Merrie Roof Other Clinician: Referring Regana Kemple: Merrie Roof Treating Garret Teale/Extender: Melburn Hake, [name] Weeks in Treatment: 17 Wound Status Wound Number: 3 Primary Diabetic Wound/Ulcer of the Lower Extremity Etiology: Wound Location: Left Toe Great Wound Open Wounding Event: Gradually Appeared Status: Date Acquired: 06/19/2019 Comorbid Lymphedema, Arrhythmia, Hypertension, Type II Weeks Of Treatment: 17 History: Diabetes, End Stage Renal Disease, Rheumatoid Arthritis, Clustered Wound: No Osteoarthritis, Confinement Anxiety Photos Wound Measurements Length: (cm) 1.4 % Red Width: (cm) 0.8 % Red Depth: (cm) 0.3 Epith Area: (cm) 0.88 Tunn Volume: (cm) 0.264 Unde uction in Area: 67.5% uction in Volume: 2.6% elialization: Small (1-33%) eling: No rmining: No Wound Description Classification: Grade 2 Foul Wound Margin: Flat and Intact Sloug Exudate Amount: Medium Exudate Type: Serous Exudate Color: amber Odor After Cleansing: No h/Fibrino Yes Wound Bed Granulation Amount: Large (67-100%) Exposed Structure Granulation Quality: Pink Fascia Exposed: No Necrotic Amount: Small (1-33%) Fat Layer (Subcutaneous Tissue) Exposed: Yes Necrotic Quality: Adherent Slough Tendon Exposed: No Muscle Exposed: No Joint Exposed: No Bone Exposed: No Treatment Notes Wound #3 (Left Toe Great) Notes apligraft, drawtex conform, offloading shoe Electronic Signature(s) LARELL, BANEY ([id number]) Signed: 11/13/2019 4:19:43 PM By: Montey Hora Entered By: Montey Hora on 11/13/2019 08:45:15 Timothy Gibson ([id number]) -------------------------------------------------------------------------------- [place] Details Patient Name: Timothy Gibson Date of Service: 11/13/2019 8:45 AM Medical Record Number: [id number] Patient Account Number: [id number] Date of Birth/Sex: 15-Nov-1950 (69 y.o. M) Treating RN: Montey Hora Primary  Care Regena Delucchi: Merrie Roof Other Clinician: Referring Kaisa Wofford: Merrie Roof Treating Teosha Casso/Extender: Melburn Hake, [name] Weeks in Treatment: 17 Vital Signs Time Taken: 08:39 Temperature (F): 98.4 Height (in): 76 Pulse (bpm): 74 Weight (lbs): 244 Respiratory Rate (breaths/min): 16 Body Mass Index (BMI): 29.7 Blood Pressure (mmHg): 196/90 Reference Range: 80 - 120 mg / dl Electronic Signature(s) Signed: 11/13/2019 4:19:43 PM By: Montey Hora Entered By: Montey Hora on 11/13/2019 08:41:48

## 2019-11-14 DIAGNOSIS — N2581 Secondary hyperparathyroidism of renal origin: Secondary | ICD-10-CM | POA: Diagnosis not present

## 2019-11-14 DIAGNOSIS — N186 End stage renal disease: Secondary | ICD-10-CM | POA: Diagnosis not present

## 2019-11-14 DIAGNOSIS — D509 Iron deficiency anemia, unspecified: Secondary | ICD-10-CM | POA: Diagnosis not present

## 2019-11-14 DIAGNOSIS — Z992 Dependence on renal dialysis: Secondary | ICD-10-CM | POA: Diagnosis not present

## 2019-11-14 DIAGNOSIS — D631 Anemia in chronic kidney disease: Secondary | ICD-10-CM | POA: Diagnosis not present

## 2019-11-17 DIAGNOSIS — N186 End stage renal disease: Secondary | ICD-10-CM | POA: Diagnosis not present

## 2019-11-17 DIAGNOSIS — D631 Anemia in chronic kidney disease: Secondary | ICD-10-CM | POA: Diagnosis not present

## 2019-11-17 DIAGNOSIS — D509 Iron deficiency anemia, unspecified: Secondary | ICD-10-CM | POA: Diagnosis not present

## 2019-11-17 DIAGNOSIS — N2581 Secondary hyperparathyroidism of renal origin: Secondary | ICD-10-CM | POA: Diagnosis not present

## 2019-11-17 DIAGNOSIS — Z992 Dependence on renal dialysis: Secondary | ICD-10-CM | POA: Diagnosis not present

## 2019-11-19 DIAGNOSIS — Z992 Dependence on renal dialysis: Secondary | ICD-10-CM | POA: Diagnosis not present

## 2019-11-19 DIAGNOSIS — D509 Iron deficiency anemia, unspecified: Secondary | ICD-10-CM | POA: Diagnosis not present

## 2019-11-19 DIAGNOSIS — D631 Anemia in chronic kidney disease: Secondary | ICD-10-CM | POA: Diagnosis not present

## 2019-11-19 DIAGNOSIS — N2581 Secondary hyperparathyroidism of renal origin: Secondary | ICD-10-CM | POA: Diagnosis not present

## 2019-11-19 DIAGNOSIS — N186 End stage renal disease: Secondary | ICD-10-CM | POA: Diagnosis not present

## 2019-11-21 DIAGNOSIS — Z992 Dependence on renal dialysis: Secondary | ICD-10-CM | POA: Diagnosis not present

## 2019-11-21 DIAGNOSIS — D631 Anemia in chronic kidney disease: Secondary | ICD-10-CM | POA: Diagnosis not present

## 2019-11-21 DIAGNOSIS — D509 Iron deficiency anemia, unspecified: Secondary | ICD-10-CM | POA: Diagnosis not present

## 2019-11-21 DIAGNOSIS — N186 End stage renal disease: Secondary | ICD-10-CM | POA: Diagnosis not present

## 2019-11-21 DIAGNOSIS — N2581 Secondary hyperparathyroidism of renal origin: Secondary | ICD-10-CM | POA: Diagnosis not present

## 2019-11-24 DIAGNOSIS — D509 Iron deficiency anemia, unspecified: Secondary | ICD-10-CM | POA: Diagnosis not present

## 2019-11-24 DIAGNOSIS — Z992 Dependence on renal dialysis: Secondary | ICD-10-CM | POA: Diagnosis not present

## 2019-11-24 DIAGNOSIS — N186 End stage renal disease: Secondary | ICD-10-CM | POA: Diagnosis not present

## 2019-11-24 DIAGNOSIS — N2581 Secondary hyperparathyroidism of renal origin: Secondary | ICD-10-CM | POA: Diagnosis not present

## 2019-11-24 DIAGNOSIS — D631 Anemia in chronic kidney disease: Secondary | ICD-10-CM | POA: Diagnosis not present

## 2019-11-25 ENCOUNTER — Ambulatory Visit (INDEPENDENT_AMBULATORY_CARE_PROVIDER_SITE_OTHER): Payer: Medicare Other | Admitting: Nurse Practitioner

## 2019-11-25 ENCOUNTER — Encounter (INDEPENDENT_AMBULATORY_CARE_PROVIDER_SITE_OTHER): Payer: Medicare Other

## 2019-11-25 DIAGNOSIS — H2513 Age-related nuclear cataract, bilateral: Secondary | ICD-10-CM | POA: Diagnosis not present

## 2019-11-25 DIAGNOSIS — H43812 Vitreous degeneration, left eye: Secondary | ICD-10-CM | POA: Diagnosis not present

## 2019-11-25 DIAGNOSIS — E113293 Type 2 diabetes mellitus with mild nonproliferative diabetic retinopathy without macular edema, bilateral: Secondary | ICD-10-CM | POA: Diagnosis not present

## 2019-11-25 LAB — HM DIABETES EYE EXAM

## 2019-11-26 DIAGNOSIS — D509 Iron deficiency anemia, unspecified: Secondary | ICD-10-CM | POA: Diagnosis not present

## 2019-11-26 DIAGNOSIS — N2581 Secondary hyperparathyroidism of renal origin: Secondary | ICD-10-CM | POA: Diagnosis not present

## 2019-11-26 DIAGNOSIS — N186 End stage renal disease: Secondary | ICD-10-CM | POA: Diagnosis not present

## 2019-11-26 DIAGNOSIS — Z992 Dependence on renal dialysis: Secondary | ICD-10-CM | POA: Diagnosis not present

## 2019-11-26 DIAGNOSIS — D631 Anemia in chronic kidney disease: Secondary | ICD-10-CM | POA: Diagnosis not present

## 2019-11-27 ENCOUNTER — Other Ambulatory Visit: Payer: Self-pay

## 2019-11-27 ENCOUNTER — Encounter: Payer: Medicare Other | Admitting: Internal Medicine

## 2019-11-27 DIAGNOSIS — L97522 Non-pressure chronic ulcer of other part of left foot with fat layer exposed: Secondary | ICD-10-CM | POA: Diagnosis not present

## 2019-11-27 DIAGNOSIS — E1122 Type 2 diabetes mellitus with diabetic chronic kidney disease: Secondary | ICD-10-CM | POA: Diagnosis not present

## 2019-11-27 DIAGNOSIS — E11621 Type 2 diabetes mellitus with foot ulcer: Secondary | ICD-10-CM | POA: Diagnosis not present

## 2019-11-27 DIAGNOSIS — M86672 Other chronic osteomyelitis, left ankle and foot: Secondary | ICD-10-CM | POA: Diagnosis not present

## 2019-11-27 DIAGNOSIS — E1169 Type 2 diabetes mellitus with other specified complication: Secondary | ICD-10-CM | POA: Diagnosis not present

## 2019-11-27 DIAGNOSIS — I120 Hypertensive chronic kidney disease with stage 5 chronic kidney disease or end stage renal disease: Secondary | ICD-10-CM | POA: Diagnosis not present

## 2019-11-28 ENCOUNTER — Ambulatory Visit: Payer: Medicare Other | Admitting: Family Medicine

## 2019-11-28 DIAGNOSIS — N186 End stage renal disease: Secondary | ICD-10-CM | POA: Diagnosis not present

## 2019-11-28 DIAGNOSIS — N2581 Secondary hyperparathyroidism of renal origin: Secondary | ICD-10-CM | POA: Diagnosis not present

## 2019-11-28 DIAGNOSIS — Z992 Dependence on renal dialysis: Secondary | ICD-10-CM | POA: Diagnosis not present

## 2019-11-28 DIAGNOSIS — D631 Anemia in chronic kidney disease: Secondary | ICD-10-CM | POA: Diagnosis not present

## 2019-11-28 DIAGNOSIS — D509 Iron deficiency anemia, unspecified: Secondary | ICD-10-CM | POA: Diagnosis not present

## 2019-11-28 NOTE — Progress Notes (Signed)
ZOLTAN, GENEST ([id number]) Visit Report for 11/27/2019 Arrival Information Details Patient Name: JONOTHAN, HEBERLE Date of Service: 11/27/2019 12:45 PM Medical Record Number: [id number] Patient Account Number: [id number] Date of Birth/Sex: 1950-08-07 (69 y.o. M) Treating RN: Army Melia Primary Care Lilliona Blakeney: Merrie Roof Other Clinician: Referring Lourene Hoston: Merrie Roof Treating Jodee Wagenaar/Extender: Tito Dine in Treatment: 47 Visit Information History Since Last Visit Added or deleted any medications: No Patient Arrived: Ambulatory Any new allergies or adverse reactions: No Arrival Time: 12:57 Had a fall or experienced change in No Accompanied By: self activities of daily living that may affect Transfer Assistance: None risk of falls: Patient Identification Verified: Yes Signs or symptoms of abuse/neglect since last visito No Secondary Verification Process Completed: Yes Hospitalized since last visit: No Patient Requires Transmission-Based Precautions: No Implantable device outside of the clinic excluding No cellular tissue based products placed in the center since last visit: Pain Present Now: No Electronic Signature(s) Signed: 11/27/2019 5:33:40 PM By: Lorine Bears RCP, RRT, CHT Entered By: Lorine Bears on 11/27/2019 12:59:00 [name] ([id number]) -------------------------------------------------------------------------------- Encounter Discharge Information Details Patient Name: [name] Date of Service: 11/27/2019 12:45 PM Medical Record Number: [id number] Patient Account Number: [id number] Date of Birth/Sex: [birth year] (68 y.o. M) Treating RN: Army Melia Primary Care Marianny Goris: Merrie Roof Other Clinician: Referring Brixton Franko: Merrie Roof Treating Alfredia Desanctis/Extender: Tito Dine in Treatment: 24 Encounter Discharge Information Items Post Procedure Vitals Discharge Condition:  Stable Temperature (F): 98.5 Ambulatory Status: Ambulatory Pulse (bpm): 84 Discharge Destination: Home Respiratory Rate (breaths/min): 16 Transportation: Private Auto Blood Pressure (mmHg): 142/82 Accompanied By: self Schedule Follow-up Appointment: Yes Clinical Summary of Care: Electronic Signature(s) Signed: 11/27/2019 5:18:09 PM By: Army Melia Entered By: Army Melia on 11/27/2019 13:34:18 [name] ([id number]) -------------------------------------------------------------------------------- Lower Extremity Assessment Details Patient Name: [name] Date of Service: 11/27/2019 12:45 PM Medical Record Number: [id number] Patient Account Number: [id number] Date of Birth/Sex: 09-Oct-1950 (68 y.o. M) Treating RN: Montey Hora Primary Care Amori Cooperman: Merrie Roof Other Clinician: Referring Joniah Bednarski: Merrie Roof Treating Mattson Dayal/Extender: Ricard Dillon Weeks in Treatment: 19 Edema Assessment Assessed: [Left: No] [Right: No] Edema: [Left: Ye] [Right: s] Vascular Assessment Pulses: Dorsalis Pedis Palpable: [Left:Yes] Electronic Signature(s) Signed: 11/27/2019 5:37:23 PM By: Montey Hora Entered By: Montey Hora on 11/27/2019 13:04:00 [name] ([id number]) -------------------------------------------------------------------------------- Multi Wound Chart Details Patient Name: [name] Date of Service: 11/27/2019 12:45 PM Medical Record Number: [id number] Patient Account Number: [id number] Date of Birth/Sex: [birth year] (69 y.o. M) Treating RN: Army Melia Primary Care Donathan Buller: Merrie Roof Other Clinician: Referring Takeem Krotzer: Merrie Roof Treating Arturo Freundlich/Extender: Tito Dine in Treatment: 19 Vital Signs Height(in): 76 Pulse(bpm): 47 Weight(lbs): 244 Blood Pressure(mmHg): 142/82 Body Mass Index(BMI): 30 Temperature(F): 98.5 Respiratory Rate(breaths/min): 18 Photos: [N/A:N/A] Wound Location: Left Toe Great N/A  N/A Wounding Event: Gradually Appeared N/A N/A Primary Etiology: Diabetic Wound/Ulcer of the Lower N/A N/A Extremity Comorbid History: Lymphedema, Arrhythmia, N/A N/A Hypertension, Type II Diabetes, End Stage Renal Disease, Rheumatoid Arthritis, Osteoarthritis, Confinement Anxiety Date Acquired: 06/19/2019 N/A N/A Weeks of Treatment: 19 N/A N/A Wound Status: Open N/A N/A Measurements L x W x D (cm) 1.2x0.6x0.3 N/A N/A Area (cm) : 0.565 N/A N/A Volume (cm) : 0.17 N/A N/A % Reduction in Area: 79.20% N/A N/A % Reduction in Volume: 37.30% N/A N/A Classification: Grade 2 N/A N/A Exudate Amount: Medium N/A N/A Exudate Type: Serous N/A N/A Exudate Color: amber N/A N/A Wound Margin: Flat and Intact N/A N/A Granulation  Amount: Large (67-100%) N/A N/A Granulation Quality: Pink N/A N/A Necrotic Amount: Small (1-33%) N/A N/A Exposed Structures: Fat Layer (Subcutaneous Tissue) N/A N/A Exposed: Yes Fascia: No Tendon: No Muscle: No Joint: No Bone: No Epithelialization: Small (1-33%) N/A N/A Treatment Notes Electronic Signature(s) Signed: 11/27/2019 5:46:06 PM By: Linton Ham MD Entered By: Linton Ham on 11/27/2019 13:28:46 LORIK, GUO ([id number]) JACORY, KAMEL ([id number]) -------------------------------------------------------------------------------- Multi-Disciplinary Care Plan Details Patient Name: [name] Date of Service: 11/27/2019 12:45 PM Medical Record Number: [id number] Patient Account Number: [id number] Date of Birth/Sex: 04-27-1951 (69 y.o. M) Treating RN: Army Melia Primary Care Addalynn Kumari: Merrie Roof Other Clinician: Referring Kodah Maret: Merrie Roof Treating Jeramey Lanuza/Extender: Tito Dine in Treatment: 74 Active Inactive Abuse / Safety / Falls / Self Care Management Nursing Diagnoses: Potential for falls Goals: Patient will not experience any injury related to falls Date Initiated: 07/15/2019 Target Resolution Date:  10/18/2019 Goal Status: Active Interventions: Assess fall risk on admission and as needed Notes: Necrotic Tissue Nursing Diagnoses: Impaired tissue integrity related to necrotic/devitalized tissue Goals: Necrotic/devitalized tissue will be minimized in the wound bed Date Initiated: 07/15/2019 Target Resolution Date: 10/18/2019 Goal Status: Active Interventions: Provide education on necrotic tissue and debridement process Notes: Orientation to the Wound Care Program Nursing Diagnoses: Knowledge deficit related to the wound healing center program Goals: Patient/caregiver will verbalize understanding of the [place] Program Date Initiated: 07/15/2019 Target Resolution Date: 10/18/2019 Goal Status: Active Interventions: Provide education on orientation to the wound center Notes: Peripheral Neuropathy Nursing Diagnoses: Knowledge deficit related to disease process and management of peripheral neurovascular dysfunction Goals: Patient/caregiver will verbalize understanding of disease process and disease management Date Initiated: 07/15/2019 Target Resolution Date: 10/18/2019 Goal Status: Active AUSTEN, OYSTER ([id number]) Interventions: Assess signs and symptoms of neuropathy upon admission and as needed Provide education on Management of Neuropathy and Related Ulcers Notes: Wound/Skin Impairment Nursing Diagnoses: Impaired tissue integrity Goals: Ulcer/skin breakdown will heal within 14 weeks Date Initiated: 07/15/2019 Target Resolution Date: 10/18/2019 Goal Status: Active Interventions: Assess patient/caregiver ability to obtain necessary supplies Assess patient/caregiver ability to perform ulcer/skin care regimen upon admission and as needed Assess ulceration(s) every visit Notes: Electronic Signature(s) Signed: 11/27/2019 5:18:09 PM By: Army Melia Entered By: Army Melia on 11/27/2019 13:24:38 [name]  ([id number]) -------------------------------------------------------------------------------- Pain Assessment Details Patient Name: [name] Date of Service: 11/27/2019 12:45 PM Medical Record Number: [id number] Patient Account Number: [id number] Date of Birth/Sex: 12-15-1950 (69 y.o. M) Treating RN: Montey Hora Primary Care Reeva Davern: Merrie Roof Other Clinician: Referring Latondra Gebhart: Merrie Roof Treating Hoke Baer/Extender: Tito Dine in Treatment: 25 Active Problems Location of Pain Severity and Description of Pain Patient Has Paino No Site Locations Pain Management and Medication Current Pain Management: Electronic Signature(s) Signed: 11/27/2019 5:37:23 PM By: Montey Hora Entered By: Montey Hora on 11/27/2019 13:03:49 [name] ([id number]) -------------------------------------------------------------------------------- Patient/Caregiver Education Details Patient Name: [name] Date of Service: 11/27/2019 12:45 PM Medical Record Number: [id number] Patient Account Number: [id number] Date of Birth/Gender: 04/07/1951 (69 y.o. M) Treating RN: Army Melia Primary Care Physician: Merrie Roof Other Clinician: Referring Physician: Merrie Roof Treating Physician/Extender: Tito Dine in Treatment: 81 Education Assessment Education Provided To: Patient Education Topics Provided Wound/Skin Impairment: Handouts: Caring for Your Ulcer Methods: Demonstration, Explain/Verbal Responses: State content correctly Electronic Signature(s) Signed: 11/27/2019 5:18:09 PM By: Army Melia Entered By: Army Melia on 11/27/2019 13:33:45 [name] ([id number]) -------------------------------------------------------------------------------- Wound Assessment Details Patient Name: [name] Date of Service:  11/27/2019 12:45 PM Medical Record Number: [id number] Patient Account Number: [id number] Date of Birth/Sex: Dec 25, 1950 (68  y.o. M) Treating RN: Montey Hora Primary Care Kenzo Ozment: Merrie Roof Other Clinician: Referring Tessia Kassin: Merrie Roof Treating Denna Fryberger/Extender: Tito Dine in Treatment: 19 Wound Status Wound Number: 3 Primary Diabetic Wound/Ulcer of the Lower Extremity Etiology: Wound Location: Left Toe Great Wound Open Wounding Event: Gradually Appeared Status: Date Acquired: 06/19/2019 Comorbid Lymphedema, Arrhythmia, Hypertension, Type II Weeks Of Treatment: 19 History: Diabetes, End Stage Renal Disease, Rheumatoid Arthritis, Clustered Wound: No Osteoarthritis, Confinement Anxiety Photos Wound Measurements Length: (cm) 1.2 % R Width: (cm) 0.6 % R Depth: (cm) 0.3 Epi Area: (cm) 0.565 Tu Volume: (cm) 0.17 Un eduction in Area: 79.2% eduction in Volume: 37.3% thelialization: Small (1-33%) nneling: No dermining: No Wound Description Classification: Grade 2 Fou Wound Margin: Flat and Intact Slo Exudate Amount: Medium Exudate Type: Serous Exudate Color: amber l Odor After Cleansing: No ugh/Fibrino Yes Wound Bed Granulation Amount: Large (67-100%) Exposed Structure Granulation Quality: Pink Fascia Exposed: No Necrotic Amount: Small (1-33%) Fat Layer (Subcutaneous Tissue) Exposed: Yes Necrotic Quality: Adherent Slough Tendon Exposed: No Muscle Exposed: No Joint Exposed: No Bone Exposed: No Treatment Notes Wound #3 (Left Toe Great) Notes apligraft, drawtex conform, offloading shoe Electronic Signature(s) VERNELL, TOWNLEY ([id number]) Signed: 11/27/2019 5:37:23 PM By: Montey Hora Entered By: Montey Hora on 11/27/2019 13:05:16 [name] ([id number]) -------------------------------------------------------------------------------- [place] Details Patient Name: [name] Date of Service: 11/27/2019 12:45 PM Medical Record Number: [id number] Patient Account Number: [id number] Date of Birth/Sex: [birth year] (69 y.o. M) Treating RN: Army Melia Primary Care Christin Mccreedy: Merrie Roof Other Clinician: Referring Akilah Cureton: Merrie Roof Treating Ladawna Walgren/Extender: Tito Dine in Treatment: 19 Vital Signs Time Taken: 12:55 Temperature (F): 98.5 Height (in): 76 Pulse (bpm): 84 Weight (lbs): 244 Respiratory Rate (breaths/min): 18 Body Mass Index (BMI): 29.7 Blood Pressure (mmHg): 142/82 Reference Range: 80 - 120 mg / dl Electronic Signature(s) Signed: 11/27/2019 5:33:40 PM By: Lorine Bears RCP, RRT, CHT Entered By: Lorine Bears on 11/27/2019 13:01:55

## 2019-11-28 NOTE — Progress Notes (Signed)
KRUZ, CHIU ([id number]) Visit Report for 11/27/2019 Cellular or Tissue Based Product Details Patient Name: Timothy Gibson, Timothy Gibson Date of Service: 11/27/2019 12:45 PM Medical Record Number: [id number] Patient Account Number: [id number] Date of Birth/Sex: 02-Apr-1951 (69 y.o. M) Treating RN: Army Melia Primary Care Provider: Merrie Roof Other Clinician: Referring Provider: Merrie Roof Treating Provider/Extender: Tito Dine in Treatment: 43 Cellular or Tissue Based Product Type Wound #3 Left Toe Great Applied to: Performed By: Physician Ricard Dillon, MD Cellular or Tissue Based Product Apligraf Type: Level of Consciousness (Pre- Awake and Alert procedure): Pre-procedure Verification/Time Out Yes - 13:20 Taken: Location: genitalia / hands / feet / multiple digits Wound Size (sq cm): 0.72 Product Size (sq cm): 44 Waste Size (sq cm): 10 Waste Reason: wound size Amount of Product Applied (sq cm): 34 Instrument Used: Blade, Forceps, Scissors Lot #: [id number] Expiration Date: 12/04/2019 Fenestrated: Yes Instrument: Blade Reconstituted: Yes Solution Type: normal saline Solution Amount: 5ML Lot #: [id number] Solution Expiration Date: 07/10/2021 Secured: Yes Secured With: mepitel Dressing Applied: Yes Primary Dressing: mepitel one Level of Consciousness (Post- Awake and Alert procedure): Post Procedure Diagnosis Same as Pre-procedure Electronic Signature(s) Signed: 11/27/2019 5:46:06 PM By: Linton Ham MD Entered By: Linton Ham on 11/27/2019 13:34:58 Timothy Gibson ([id number]) -------------------------------------------------------------------------------- HPI Details Patient Name: Timothy Gibson Date of Service: 11/27/2019 12:45 PM Medical Record Number: [id number] Patient Account Number: [id number] Date of Birth/Sex: 1950/08/31 (69 y.o. M) Treating RN: Army Melia Primary Care Provider: Merrie Roof Other Clinician: Referring Provider:  Merrie Roof Treating Provider/Extender: Tito Dine in Treatment: 19 History of Present Illness HPI Description: 11/27/17 on evaluation today patient presents for initial evaluation concerning an issue that he has been having with his right great toe which began last Thursday 11/22/17. He has a history of diabetes mellitus type to which he has had for greater than 30 years currently he is on insulin. Subsequently he also has a history of hypertension. On physical exam inspection is also appears he may have some peripheral vascular disease with his ABI's being noncompressible bilaterally. He is on dialysis as well and this is on Monday, Wednesday, and Friday. Patient was hospitalized back in January/February 2019 although this was more for stomach/back pain though they never told me exactly what was going on. This is according to the patient. Subsequently the ulcer which is between the third and fourth toe when space of the right foot as well is on the plantar aspect of the fourth toe is due to him having cleaned his toes this morning and he states that his finger which is large split the toe tissue in between causing the wounds that we currently see. With that being said he states this has happened before I explained I would definitely recommend that he not do this any longer. He can use a small soft washcloth to clean between the toes without causing trauma. Subsequently the right great toe hatchery does show evidence of necrotic tissue present on the surface of the wound specifically there is callous and dead skin surrounding which is trapping fluid causing problems as far as the wound is concerned. The surface of the wound does show slough although due to his vascular flow I'm not going to sharply debride this today I think I will selectively debride away the necrotic skin as well is callous from around the surrounding so this will not continue to be a moisture issue. Nonetheless the  patient has no pain he does have diabetic  neuropathy. 12/06/17-He is here in follow-up evaluation for a right great toe ulcer. He is voicing no complaints or concerns. He continues to infrequently/socially smokes cigars with no desire to quit. He has been compliant with offloading, using open toed surgical shoe; has been compliant using Santyl daily. He continues on clindamycin although takes it inconsistently secondary to indigestion. The plain film x-ray performed on 5/23 impression: Soft tissue swelling of the left first toe and is noted with probable irregular lucency involving distal tuft of first distal phalanx concerning for osteomyelitis. MRI may be performed for further evaluation; MRI ordered. The vascular evaluation performed on 5/24 field non- compressible ABIs bilaterally and reduced TBI bilaterally suggesting significant tibial disease. He will be referred to vascular medicine for further evaluation. 12/13/17-He is here in follow-up evaluation for right great toe ulcer. He has appointment next Thursday for the MRI and vascular evaluation. Wound culture obtained last week grew abundant Klebsiella oxytoca (multidrug sensitivity), and abundant enteric coccus faecalis (sensitive to ampicillin). Ampicillin and Cipro were called in, along with a probiotic. In light of his appointments next Thursday he will follow-up in 2 weeks, we will continue with Santyl 12/27/17-He is here for evaluation for a right great toe ulcer. 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 He did have evaluation by vascular medicine, Dr. Ronalee Belts, on 6/13, at that appointment it was decided for him to undergo angiography with possible intervention but he refused and is still considering. If he chooses to have it done he will contact the office. He has not heard back from either ID offices that he was referred to two weeks ago: [place] and [place].  there is improvement in both appearance and measurement to the ulcer. We will extend antibiotic therapy (amoxicillin 500 every 12, Cipro 500 daily) for additional 2 weeks pending infectious disease consult, he will continue with Santyl daily. He was reminded to continue with probiotic therapy while on oral antibiotics; he denies any GI disturbance. 01/03/18-He is here in follow-up evaluation for right great toe ulcer. He is decided to not undergo any vascular intervention at this time. He was established with [facility] infectious disease (Dr. Linus Salmons) last week initiated on vancomycin and cefazolin with dialysis treatment for 6 weeks. We will continue with current treatment plan with Santyl and offloading and he will follow-up in 2 weeks 01/17/18-He is seen in follow-up evaluation for right great toe ulcer. There is improvement in appearance with less nonviable tissue present. We will continue with same treatment plan he will follow-up next week. He is tolerating IV antibiotics without any complications 9/32/35-TD is seen in follow up evaluation for right great toe ulcer. There continues to be improvement. He is tolerating IV antibiotics with hemodialysis, completion date of 7/31. We will transition to collagen and and follow-up next week 02/07/18-He is here in follow up for a right great toe ulcer. There is red granulation tissue throughout the wound, improved in appearance. He completed antibiotics yesterday. He saw podiatry last Thursday for nail trimming, at that appointment he periwound callus was trimmed and a culture was obtained; culture negative. He does not have a follow up appointment with infectious disease. We will submit for grafix. 02/14/18-He is here in follow-up evaluation for a right great toe ulcer. There is slow improvement, red granulation tissue throughout. The insurance approval for grafix is pending. We will continue with collagen and he'll follow-up next week 02/21/18-He is seen in  follow-up  evaluation for right great toe ulcer. There is improvement with red granulation tissue throughout. He was approved for oasis and this was placed today. He will follow up next week 02/28/18-He is seen in follow-up evaluation for right great toe ulcer. The wound is stable, oasis applied and he'll follow-up next week 03/07/18-He is seen in follow-up violation of her right great toe ulcer. The wound is dry, no evidence of drainage. The wound appears healed today. He was painted with Betadine, instructed to paint with Betadine daily, cover with band-aid for the next week and then cover with band-aid for the following week continue with surgical shoe. He was encouraged to contact the clinic with any evidence of drainage, or change in appearance to toe/wound. He will be discharged from wound care services Readmission: 07/14/18 on evaluation today patient presents for initial evaluation our clinic concerning issues that he is having with his left great toe. We previously saw him in 2019 for his right great toe which was actually worse at that time and subsequently was able to be completely healed in the end although it did take several months. With that being said the patient is currently having an area on his left great toe that occurred initially as result of a blister that came up he's not really sure why or how. Subsequently this was initially treated by his podiatrist though the recommendation there was apparently amputation according to the patient and his daughter who was present with him today. With that being said the patient did not want to proceed with amputation and would like to try to perform wound care and get this to heal without having to go down that road. We were able to do that with his other toe and so he would like to at least give that a try before going forward with amputation. Again I completely understand his concern in this regard. With that being said I did discuss with the  patient obviously that it's always a chance that we cannot get this to heal with normal wound care measures but obviously we will give it our best shot. He does actually have an appointment scheduled for later today with the vascular specialist in order to evaluate his blood flow. That was at the recommendation of his podiatrist as well. Obviously if he is having good arterial flow and nonetheless is still experiencing issues with having the wound delay in healing then it may simply be a matter of we need to initiate more aggressive wound to therapy in order to see if we can get this to heal. No fevers, chills, GLOYD, HAPP. ([id number]) nausea, or vomiting noted at this time. The patient notes that this woman has been present for approximately three weeks. He is not a smoker. His most recent hemoglobin A1c was 6.2 on 06/18/19. 07/17/2019 on evaluation today patient appears unfortunately to not be doing as well. He did see vascular I did review the note as well. That was on 07/15/2019. Subsequently there can actually be taking him to the OR for an angiogram due to poor arterial flow into this extremity. Apparently the atherosclerotic changes were severe. The patient is stated to be at risk for limb loss. Nonetheless the good news is this is being scheduled fairly quickly he will have the surgery/procedure next Thursday. With that being said I am going also go ahead based on the results of his x-ray which showed some chance of osteomyelitis in the distal portion of the toe get the patient  set up for an MRI to further evaluate for the possibility of osteomyelitis obviously if he does have osteomyelitis we need to know so that we can address this appropriately. 07/29/2019 upon evaluation today patient appears to be doing better in general with regard to his toe ulcer. He does have much better blood flow you can actually feel a palpable pulse which appears to be very strong at this time. I did review his  arterial intervention/angiogram which did show that he had evidence of sufficient arterial flow restoration at this point. He did have occlusions that were 60 and 80% that residually were only 10 and 25% with no limiting flow. This is excellent news and hopefully he will continue to show signs of improvement in light of the improved vascular status. With that being said the toe mainly shows somewhat of an eschar today I think that this is fairly stable and as such probably would be best not to really do much in the way of debridement at this point I do believe Betadine would likely be a good option. 08/07/2019 on evaluation today patient actually appears to be doing about the same. The one difference is the eschar that we were just seeing if we can maintain is actually starting to loosen up and leak around the edges I think it is time to actually go ahead and remove this today. Is also no longer stable is very soft. Nonetheless the patient is okay with proceeding with debridement at this point. Fortunately there is no signs of active infection. No fevers, chills, nausea, vomiting, or diarrhea. The patient states that he took his last antibiotic that he had last night. He would need a refill as he will not be seeing infectious disease until February 2. Nonetheless obviously I do not want him to have any break in therapy especially when he is doing as well as he is currently. 08/14/2019 on evaluation today patient appears to be doing quite well all things considered in regard to his toe. I do feel like he is making progress and though this is not completely healed by any means we still have some ways to go I feel like he is making progress which is excellent. There is no sign of active infection at this time systemically. He does have osteomyelitis of the distal phalanx of his great toe on the left. He did see Dr. Linus Salmons. That is the infectious disease doctor in [place] where he was referred. Dr. Linus Salmons  apparently according to the note did not feel like the patient's MRI revealed osteomyelitis but just reactive marrow and subsequently did not recommend any antibiotic therapy whatsoever. To be peripherally honest I am not really in agreement with this based on the MRI results and what we are seeing and I think he has been responding at least decently well to oral medication as well. Nonetheless we may want to check into the possibility of IV antibiotics being administered at the dialysis center. I will discuss this with Dr. Dellia Nims further. 08/21/2019 upon evaluation today patient appears at this point to be making some progress with regard to his wounds. He has been tolerating the dressing changes without complication. Fortunately there is no evidence of active infection at this time. Overall I feel like he is doing well with the oral antibiotics I discussed with Dr. Dellia Nims the possibility of doing IV antibiotic therapy versus continuing with the oral. His opinion was if the patient is doing well with oral to maybe stick with that.  Obviously we can initiate additional Cipro as well just to help ensure that there is nothing worsening here. The Cipro should be beneficial for him as well. That helps cover more gram-negative's were is at the doxycycline is more gram- positive's. 09/02/2019 on evaluation today patient appears to be doing fairly well upon inspection today. With that being said though the patient is continuing to do well he does also continue to develop issues with buildup of necrotic tissue on the distal portion of his toe he does have good arterial flow however. For that reason I am going to suggest that what we may need to do is consider using something such as Santyl to try to keep this free and clear. Also discussed in greater detail HBO therapy with him today please see plan for additional recommendations and results of this discussion. 09/11/2019 upon evaluation today patient appears to  be doing well with regard to his toe ulcer. This actually appears to be much better today using the Santyl than it was previous. Fortunately there is no signs of active infection. No fevers, chills, nausea, vomiting, or diarrhea. 09/18/2019 upon evaluation today patient appears to be doing much better in regard to his wound in general. The Santyl was doing a good job at loosening stuff up here for [place] which is excellent news. With that being said he is still taking the oral antibiotic. I sent this in last for him on 09/03/2019. This was the doxycycline. When this runs out he will actually be complete as far as the treatment is concerned. With that being said I am getting continue the Cipro for 1 additional month. Overall his wound seems to be doing quite well. 09/25/2019 upon evaluation today patient appears to be doing well with regard to his toe ulcer. This is measuring much better and overall seems to be making good progress. Fortunately there is no signs of active infection at this time. No fevers, chills, nausea, vomiting, or diarrhea. 10/02/2019 upon evaluation today patient appears to be doing well with regard to his wound. He has been tolerating the dressing changes without complication. Fortunately there is no signs of active infection at this time. No fevers, chills, nausea, vomiting, or diarrhea. Overall I am extremely pleased with how things seem to be progressing. I think the wound bed is at the point now where we can likely proceed with additional measures to try to get this healed more quickly I think Dermagraft may be a good possibility for the patient. We can definitely look into seeing about approval for this. 10/09/2019 upon evaluation today patient's wound actually appears to be doing well although it still is progressing somewhat slowly compared to what I would like to see. With that being said I did order Apligraf for the patient which I felt would keep the wound nice and moist  without allowing it to dry out and hopefully allow this to heal much more effectively and quickly. The patient actually did obtain approval and therefore we can apply that today. 10/23/2019 upon evaluation today patient's wound actually appears to be doing significantly better with regard to his toe ulcer. The tissue at the base of the wound is greatly improved and overall I am very pleased with the progress made. We did use Apligraf 2 weeks ago on the toe that did excellent for him much it has completely dissolved after as of the 2 weeks. They only had to change the dressing a few times due to drainage through. Obviously they left  the Steri-Strips and Mepitel in place. Unfortunately we do not have the Apligraf available today for application regard therefore have to use a different product here in the clinic today and then order the Apligraf for next Thursday. That will be his second application. 4/22; patient arrived with overhanging skin and subcutaneous tissue on the wound margins this was removed. Apligraf #2 applied 11/13/2019 upon evaluation today patient actually appears to be doing well with regard to his foot ulcer. He has been tolerating the dressing changes without complication. The overall wound bed appears to be healthier and has filled in as well as not nearly as deep as it was the tissue is also better quality. Overall I am extremely pleased with how things seem to be progressing. The patient is likewise very happy in this regard. 5/20; wound is measuring smaller had Apligraf #3 last time. Apligraf #4 today CHAPIN, ARDUINI ([id number]) Electronic Signature(s) Signed: 11/27/2019 5:46:06 PM By: Linton Ham MD Entered By: Linton Ham on 11/27/2019 92:42:68 JAKOLBY, SEDIVY ([id number]) -------------------------------------------------------------------------------- Physical Exam Details Patient Name: Timothy Gibson Date of Service: 11/27/2019 12:45 PM Medical Record  Number: [id number] Patient Account Number: [id number] Date of Birth/Sex: [birth year] (69 y.o. M) Treating RN: Army Melia Primary Care Provider: Merrie Roof Other Clinician: Referring Provider: Merrie Roof Treating Provider/Extender: Tito Dine in Treatment: 47 Constitutional Patient is hypertensive.. Pulse regular and within target range for patient.Marland Kitchen Respirations regular, non-labored and within target range.. Temperature is normal and within the target range for the patient.Marland Kitchen appears in no distress. Notes Wound exam; no debridement is required. Healthy looking wound bed but still some depth. Apligraf #4 in the standard fashion Electronic Signature(s) Signed: 11/27/2019 5:46:06 PM By: Linton Ham MD Entered By: Linton Ham on 11/27/2019 13:30:05 Timothy Gibson ([id number]) -------------------------------------------------------------------------------- Physician Orders Details Patient Name: Timothy Gibson Date of Service: 11/27/2019 12:45 PM Medical Record Number: [id number] Patient Account Number: [id number] Date of Birth/Sex: Nov 23, 1950 (69 y.o. M) Treating RN: Army Melia Primary Care Provider: Merrie Roof Other Clinician: Referring Provider: Merrie Roof Treating Provider/Extender: Tito Dine in Treatment: 63 Verbal / Phone Orders: No Diagnosis Coding ICD-10 Coding Code Description [phone] Other chronic osteomyelitis, left ankle and foot E11.621 Type 2 diabetes mellitus with foot ulcer L97.522 Non-pressure chronic ulcer of other part of left foot with fat layer exposed I10 Essential (primary) hypertension Wound Cleansing Wound #3 Left Toe Great o Dial antibacterial soap, wash wounds, rinse and pat dry prior to dressing wounds o May Shower, gently pat wound dry prior to applying new dressing. Anesthetic (add to Medication List) Wound #3 Left Toe Great o Topical Lidocaine 4% cream applied to wound bed prior to debridement (In  Clinic Only). Primary Wound Dressing Wound #3 Left Toe Great o Other: - Apligraft applied Secondary Dressing Wound #3 Left Toe Great o Conform/Kerlix o Drawtex o Mepitel One or equivalent Dressing Change Frequency Wound #3 Left Toe Great o Other: - As needed Follow-up Appointments Wound #3 Left Toe Great o Return Appointment in 2 weeks. Advanced Therapies Wound #3 Left Toe Great o Apligraf application in clinic; including contact layer, fixation with steri strips, dry gauze and cover dressing. Electronic Signature(s) Signed: 11/27/2019 5:18:09 PM By: Army Melia Signed: 11/27/2019 5:46:06 PM By: Linton Ham MD Entered By: Army Melia on 11/27/2019 13:33:25 VINH, SACHS ([id number]) -------------------------------------------------------------------------------- Problem List Details Patient Name: Timothy Gibson Date of Service: 11/27/2019 12:45 PM Medical Record Number: [id number] Patient Account Number: [id number] Date of Birth/Sex: 25-Sep-1950 (68  y.o. M) Treating RN: Army Melia Primary Care Provider: Merrie Roof Other Clinician: Referring Provider: Merrie Roof Treating Provider/Extender: Tito Dine in Treatment: 33 Active Problems ICD-10 Encounter Code Description Active Date MDM Diagnosis [phone] Other chronic osteomyelitis, left ankle and foot 07/29/2019 No Yes E11.621 Type 2 diabetes mellitus with foot ulcer 07/15/2019 No Yes L97.522 Non-pressure chronic ulcer of other part of left foot with fat layer 07/15/2019 No Yes exposed [place] (primary) hypertension 07/15/2019 No Yes Inactive Problems Resolved Problems Electronic Signature(s) Signed: 11/27/2019 5:46:06 PM By: Linton Ham MD Entered By: Linton Ham on 11/27/2019 13:28:36 Timothy Gibson ([id number]) -------------------------------------------------------------------------------- Progress Note Details Patient Name: Timothy Gibson Date of Service:  11/27/2019 12:45 PM Medical Record Number: [id number] Patient Account Number: [id number] Date of Birth/Sex: May 24, 1951 (69 y.o. M) Treating RN: Army Melia Primary Care Provider: Merrie Roof Other Clinician: Referring Provider: Merrie Roof Treating Provider/Extender: Tito Dine in Treatment: 19 Subjective History of Present Illness (HPI) 11/27/17 on evaluation today patient presents for initial evaluation concerning an issue that he has been having with his right great toe which began last Thursday 11/22/17. He has a history of diabetes mellitus type to which he has had for greater than 30 years currently he is on insulin. Subsequently he also has a history of hypertension. On physical exam inspection is also appears he may have some peripheral vascular disease with his ABI's being noncompressible bilaterally. He is on dialysis as well and this is on Monday, Wednesday, and Friday. Patient was hospitalized back in January/February 2019 although this was more for stomach/back pain though they never told me exactly what was going on. This is according to the patient. Subsequently the ulcer which is between the third and fourth toe when space of the right foot as well is on the plantar aspect of the fourth toe is due to him having cleaned his toes this morning and he states that his finger which is large split the toe tissue in between causing the wounds that we currently see. With that being said he states this has happened before I explained I would definitely recommend that he not do this any longer. He can use a small soft washcloth to clean between the toes without causing trauma. Subsequently the right great toe hatchery does show evidence of necrotic tissue present on the surface of the wound specifically there is callous and dead skin surrounding which is trapping fluid causing problems as far as the wound is concerned. The surface of the wound does show slough although due to his  vascular flow I'm not going to sharply debride this today I think I will selectively debride away the necrotic skin as well is callous from around the surrounding so this will not continue to be a moisture issue. Nonetheless the patient has no pain he does have diabetic neuropathy. 12/06/17-He is here in follow-up evaluation for a right great toe ulcer. He is voicing no complaints or concerns. He continues to infrequently/socially smokes cigars with no desire to quit. He has been compliant with offloading, using open toed surgical shoe; has been compliant using Santyl daily. He continues on clindamycin although takes it inconsistently secondary to indigestion. The plain film x-ray performed on 5/23 impression: Soft tissue swelling of the left first toe and is noted with probable irregular lucency involving distal tuft of first distal phalanx concerning for osteomyelitis. MRI may be performed for further evaluation; MRI ordered. The vascular evaluation performed on 5/24 field non- compressible ABIs  bilaterally and reduced TBI bilaterally suggesting significant tibial disease. He will be referred to vascular medicine for further evaluation. 12/13/17-He is here in follow-up evaluation for right great toe ulcer. He has appointment next Thursday for the MRI and vascular evaluation. Wound culture obtained last week grew abundant Klebsiella oxytoca (multidrug sensitivity), and abundant enteric coccus faecalis (sensitive to ampicillin). Ampicillin and Cipro were called in, along with a probiotic. In light of his appointments next Thursday he will follow-up in 2 weeks, we will continue with Santyl 12/27/17-He is here for evaluation for a right great toe ulcer. 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 He did have evaluation by vascular medicine, Dr. Ronalee Belts, on 6/13, at that appointment it was decided for him to undergo angiography  with possible intervention but he refused and is still considering. If he chooses to have it done he will contact the office. He has not heard back from either ID offices that he was referred to two weeks ago: [place] and [place]. there is improvement in both appearance and measurement to the ulcer. We will extend antibiotic therapy (amoxicillin 500 every 12, Cipro 500 daily) for additional 2 weeks pending infectious disease consult, he will continue with Santyl daily. He was reminded to continue with probiotic therapy while on oral antibiotics; he denies any GI disturbance. 01/03/18-He is here in follow-up evaluation for right great toe ulcer. He is decided to not undergo any vascular intervention at this time. He was established with [facility] infectious disease (Dr. Linus Salmons) last week initiated on vancomycin and cefazolin with dialysis treatment for 6 weeks. We will continue with current treatment plan with Santyl and offloading and he will follow-up in 2 weeks 01/17/18-He is seen in follow-up evaluation for right great toe ulcer. There is improvement in appearance with less nonviable tissue present. We will continue with same treatment plan he will follow-up next week. He is tolerating IV antibiotics without any complications 01/16/61-IR is seen in follow up evaluation for right great toe ulcer. There continues to be improvement. He is tolerating IV antibiotics with hemodialysis, completion date of 7/31. We will transition to collagen and and follow-up next week 02/07/18-He is here in follow up for a right great toe ulcer. There is red granulation tissue throughout the wound, improved in appearance. He completed antibiotics yesterday. He saw podiatry last Thursday for nail trimming, at that appointment he periwound callus was trimmed and a culture was obtained; culture negative. He does not have a follow up appointment with infectious disease. We will submit for grafix. 02/14/18-He is here in follow-up  evaluation for a right great toe ulcer. There is slow improvement, red granulation tissue throughout. The insurance approval for grafix is pending. We will continue with collagen and he'll follow-up next week 02/21/18-He is seen in follow-up evaluation for right great toe ulcer. There is improvement with red granulation tissue throughout. He was approved for oasis and this was placed today. He will follow up next week 02/28/18-He is seen in follow-up evaluation for right great toe ulcer. The wound is stable, oasis applied and he'll follow-up next week 03/07/18-He is seen in follow-up violation of her right great toe ulcer. The wound is dry, no evidence of drainage. The wound appears healed today. He was painted with Betadine, instructed to paint with Betadine daily, cover with band-aid for the next week and then cover with band-aid for the following week continue with surgical shoe. He was encouraged  to contact the clinic with any evidence of drainage, or change in appearance to toe/wound. He will be discharged from wound care services Readmission: 07/14/18 on evaluation today patient presents for initial evaluation our clinic concerning issues that he is having with his left great toe. We previously saw him in 2019 for his right great toe which was actually worse at that time and subsequently was able to be completely healed in the end although it did take several months. With that being said the patient is currently having an area on his left great toe that occurred initially as result of a blister that came up he's not really sure why or how. Subsequently this was initially treated by his podiatrist though the recommendation there was apparently amputation according to the patient and his daughter who was present with him today. With that being said the patient did not want to proceed with amputation and would like to try to perform wound care and get this to heal without having to go down that road.  We were able to do that with his other toe and so he would like to at least give that a try before going forward with amputation. Again I completely understand his concern in this regard. With that being said I did discuss with the patient obviously that it's always a chance that we cannot get this to heal with normal wound care measures but obviously we will give it our best shot. He does actually have an appointment scheduled for later today with the vascular specialist in order to evaluate his blood flow. That was at the recommendation of his podiatrist as well. Obviously if he is having good arterial flow and nonetheless is still experiencing issues with having the wound delay in healing then it may simply be a matter of we need to initiate more aggressive wound to therapy in order to see if we can get this to heal. No fevers, chills, JAYCEION, LISENBY. ([id number]) nausea, or vomiting noted at this time. The patient notes that this woman has been present for approximately three weeks. He is not a smoker. His most recent hemoglobin A1c was 6.2 on 06/18/19. 07/17/2019 on evaluation today patient appears unfortunately to not be doing as well. He did see vascular I did review the note as well. That was on 07/15/2019. Subsequently there can actually be taking him to the OR for an angiogram due to poor arterial flow into this extremity. Apparently the atherosclerotic changes were severe. The patient is stated to be at risk for limb loss. Nonetheless the good news is this is being scheduled fairly quickly he will have the surgery/procedure next Thursday. With that being said I am going also go ahead based on the results of his x-ray which showed some chance of osteomyelitis in the distal portion of the toe get the patient set up for an MRI to further evaluate for the possibility of osteomyelitis obviously if he does have osteomyelitis we need to know so that we can address this appropriately. 07/29/2019 upon  evaluation today patient appears to be doing better in general with regard to his toe ulcer. He does have much better blood flow you can actually feel a palpable pulse which appears to be very strong at this time. I did review his arterial intervention/angiogram which did show that he had evidence of sufficient arterial flow restoration at this point. He did have occlusions that were 60 and 80% that residually were only 10 and 25% with no  limiting flow. This is excellent news and hopefully he will continue to show signs of improvement in light of the improved vascular status. With that being said the toe mainly shows somewhat of an eschar today I think that this is fairly stable and as such probably would be best not to really do much in the way of debridement at this point I do believe Betadine would likely be a good option. 08/07/2019 on evaluation today patient actually appears to be doing about the same. The one difference is the eschar that we were just seeing if we can maintain is actually starting to loosen up and leak around the edges I think it is time to actually go ahead and remove this today. Is also no longer stable is very soft. Nonetheless the patient is okay with proceeding with debridement at this point. Fortunately there is no signs of active infection. No fevers, chills, nausea, vomiting, or diarrhea. The patient states that he took his last antibiotic that he had last night. He would need a refill as he will not be seeing infectious disease until February 2. Nonetheless obviously I do not want him to have any break in therapy especially when he is doing as well as he is currently. 08/14/2019 on evaluation today patient appears to be doing quite well all things considered in regard to his toe. I do feel like he is making progress and though this is not completely healed by any means we still have some ways to go I feel like he is making progress which is excellent. There is no sign of  active infection at this time systemically. He does have osteomyelitis of the distal phalanx of his great toe on the left. He did see Dr. Linus Salmons. That is the infectious disease doctor in [place] where he was referred. Dr. Linus Salmons apparently according to the note did not feel like the patient's MRI revealed osteomyelitis but just reactive marrow and subsequently did not recommend any antibiotic therapy whatsoever. To be peripherally honest I am not really in agreement with this based on the MRI results and what we are seeing and I think he has been responding at least decently well to oral medication as well. Nonetheless we may want to check into the possibility of IV antibiotics being administered at the dialysis center. I will discuss this with Dr. Dellia Nims further. 08/21/2019 upon evaluation today patient appears at this point to be making some progress with regard to his wounds. He has been tolerating the dressing changes without complication. Fortunately there is no evidence of active infection at this time. Overall I feel like he is doing well with the oral antibiotics I discussed with Dr. Dellia Nims the possibility of doing IV antibiotic therapy versus continuing with the oral. His opinion was if the patient is doing well with oral to maybe stick with that. Obviously we can initiate additional Cipro as well just to help ensure that there is nothing worsening here. The Cipro should be beneficial for him as well. That helps cover more gram-negative's were is at the doxycycline is more gram- positive's. 09/02/2019 on evaluation today patient appears to be doing fairly well upon inspection today. With that being said though the patient is continuing to do well he does also continue to develop issues with buildup of necrotic tissue on the distal portion of his toe he does have good arterial flow however. For that reason I am going to suggest that what we may need to do is consider using  something such as  Santyl to try to keep this free and clear. Also discussed in greater detail HBO therapy with him today please see plan for additional recommendations and results of this discussion. 09/11/2019 upon evaluation today patient appears to be doing well with regard to his toe ulcer. This actually appears to be much better today using the Santyl than it was previous. Fortunately there is no signs of active infection. No fevers, chills, nausea, vomiting, or diarrhea. 09/18/2019 upon evaluation today patient appears to be doing much better in regard to his wound in general. The Santyl was doing a good job at loosening stuff up here for [place] which is excellent news. With that being said he is still taking the oral antibiotic. I sent this in last for him on 09/03/2019. This was the doxycycline. When this runs out he will actually be complete as far as the treatment is concerned. With that being said I am getting continue the Cipro for 1 additional month. Overall his wound seems to be doing quite well. 09/25/2019 upon evaluation today patient appears to be doing well with regard to his toe ulcer. This is measuring much better and overall seems to be making good progress. Fortunately there is no signs of active infection at this time. No fevers, chills, nausea, vomiting, or diarrhea. 10/02/2019 upon evaluation today patient appears to be doing well with regard to his wound. He has been tolerating the dressing changes without complication. Fortunately there is no signs of active infection at this time. No fevers, chills, nausea, vomiting, or diarrhea. Overall I am extremely pleased with how things seem to be progressing. I think the wound bed is at the point now where we can likely proceed with additional measures to try to get this healed more quickly I think Dermagraft may be a good possibility for the patient. We can definitely look into seeing about approval for this. 10/09/2019 upon evaluation today patient's wound  actually appears to be doing well although it still is progressing somewhat slowly compared to what I would like to see. With that being said I did order Apligraf for the patient which I felt would keep the wound nice and moist without allowing it to dry out and hopefully allow this to heal much more effectively and quickly. The patient actually did obtain approval and therefore we can apply that today. 10/23/2019 upon evaluation today patient's wound actually appears to be doing significantly better with regard to his toe ulcer. The tissue at the base of the wound is greatly improved and overall I am very pleased with the progress made. We did use Apligraf 2 weeks ago on the toe that did excellent for him much it has completely dissolved after as of the 2 weeks. They only had to change the dressing a few times due to drainage through. Obviously they left the Steri-Strips and Mepitel in place. Unfortunately we do not have the Apligraf available today for application regard therefore have to use a different product here in the clinic today and then order the Apligraf for next Thursday. That will be his second application. 4/22; patient arrived with overhanging skin and subcutaneous tissue on the wound margins this was removed. Apligraf #2 applied 11/13/2019 upon evaluation today patient actually appears to be doing well with regard to his foot ulcer. He has been tolerating the dressing changes without complication. The overall wound bed appears to be healthier and has filled in as well as not nearly as deep as it was  the tissue is also better quality. Overall I am extremely pleased with how things seem to be progressing. The patient is likewise very happy in this regard. 5/20; wound is measuring smaller had Apligraf #3 last time. Apligraf #4 today NAVEN, GIAMBALVO ([id number]) Objective Constitutional Patient is hypertensive.. Pulse regular and within target range for patient.Marland Kitchen Respirations regular,  non-labored and within target range.. Temperature is normal and within the target range for the patient.Marland Kitchen appears in no distress. Vitals Time Taken: 12:55 PM, Height: 76 in, Weight: 244 lbs, BMI: 29.7, Temperature: 98.5 F, Pulse: 84 bpm, Respiratory Rate: 18 breaths/min, Blood Pressure: 142/82 mmHg. General Notes: Wound exam; no debridement is required. Healthy looking wound bed but still some depth. Apligraf #4 in the standard fashion Integumentary (Hair, Skin) Wound #3 status is Open. Original cause of wound was Gradually Appeared. The wound is located on the Left Toe Great. The wound measures 1.2cm length x 0.6cm width x 0.3cm depth; 0.565cm^2 area and 0.17cm^3 volume. There is Fat Layer (Subcutaneous Tissue) Exposed exposed. There is no tunneling or undermining noted. There is a medium amount of serous drainage noted. The wound margin is flat and intact. There is large (67-100%) pink granulation within the wound bed. There is a small (1-33%) amount of necrotic tissue within the wound bed including Adherent Slough. 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 A skin graft procedure using a bioengineered skin substitute/cellular or tissue based product was performed by Ricard Dillon, MD with the following instrument (s): Blade, Forceps, and Scissors. Apligraf was applied and secured with mepitel. 34 sq cm of product was utilized and 10 sq cm was wasted due to wound size. Post Application, mepitel one was applied. A Time Out was conducted at 13:20, prior to the start of the procedure. Post procedure Diagnosis Wound #3: Same as Pre-Procedure . Plan Wound  Cleansing: Wound #3 Left Toe Great: Dial antibacterial soap, wash wounds, rinse and pat dry prior to dressing wounds May Shower, gently pat wound dry prior to applying new dressing. Anesthetic (add to Medication List): Wound #3 Left Toe Great: Topical Lidocaine 4% cream applied to wound bed prior to debridement (In Clinic Only). Primary Wound Dressing: Wound #3 Left Toe Great: Other: - Apligraft applied Secondary Dressing: Wound #3 Left Toe Great: Conform/Kerlix OTHON, GUARDIA ([id number]) Drawtex Mepitel One or equivalent Dressing Change Frequency: Wound #3 Left Toe Great: Other: - As needed Follow-up Appointments: Wound #3 Left Toe Great: Return Appointment in 2 weeks. Advanced Therapies: Wound #3 Left Toe Great: Apligraf application in clinic; including contact layer, fixation with steri strips, dry gauze and cover dressing. 1. Apligraf #4 applied in a standard fashion. Electronic Signature(s) Signed: 11/27/2019 1:35:47 PM By: Linton Ham MD Entered By: Linton Ham on 11/27/2019 13:35:46 Timothy Gibson ([id number]) -------------------------------------------------------------------------------- [facility] Details Patient Name: Timothy Gibson Date of Service: 11/27/2019 Medical Record Number: [id number] Patient Account Number: [id number] Date of Birth/Sex: July 10, 1951 (69 y.o. M) Treating RN: Army Melia Primary Care Provider: Merrie Roof Other Clinician: Referring Provider: Merrie Roof Treating Provider/Extender: Tito Dine in Treatment: 19 Diagnosis Coding ICD-10 Codes Code Description [phone] Other chronic osteomyelitis, left ankle and foot E11.621 Type 2 diabetes mellitus with foot ulcer L97.522  Non-pressure chronic ulcer of other part of left foot with fat layer exposed I10 Essential (primary) hypertension Facility Procedures CPT4 Code: [id number] Description: 37374 - SKIN SUB GRAFT TRNK/ARM/LEG Modifier: Quantity: 1 CPT4  Code: Description: ICD-10 Diagnosis Description E11.621 Type 2 diabetes mellitus with foot ulcer L97.522 Non-pressure chronic ulcer of other part of left foot with fat layer exp Modifier: osed Quantity: Physician Procedures CPT4 Code: 9664660 Description: 56372 - WC PHYS SKIN SUB GRAFT TRNK/ARM/LEG Modifier: Quantity: 1 CPT4 Code: Description: ICD-10 Diagnosis Description E11.621 Type 2 diabetes mellitus with foot ulcer L97.522 Non-pressure chronic ulcer of other part of left foot with fat layer expos Modifier: ed Quantity: Electronic Signature(s) Signed: 11/27/2019 5:46:06 PM By: Linton Ham MD Entered By: Linton Ham on 11/27/2019 13:34:22

## 2019-12-01 DIAGNOSIS — N2581 Secondary hyperparathyroidism of renal origin: Secondary | ICD-10-CM | POA: Diagnosis not present

## 2019-12-01 DIAGNOSIS — Z992 Dependence on renal dialysis: Secondary | ICD-10-CM | POA: Diagnosis not present

## 2019-12-01 DIAGNOSIS — D509 Iron deficiency anemia, unspecified: Secondary | ICD-10-CM | POA: Diagnosis not present

## 2019-12-01 DIAGNOSIS — D631 Anemia in chronic kidney disease: Secondary | ICD-10-CM | POA: Diagnosis not present

## 2019-12-01 DIAGNOSIS — N186 End stage renal disease: Secondary | ICD-10-CM | POA: Diagnosis not present

## 2019-12-03 DIAGNOSIS — D631 Anemia in chronic kidney disease: Secondary | ICD-10-CM | POA: Diagnosis not present

## 2019-12-03 DIAGNOSIS — N186 End stage renal disease: Secondary | ICD-10-CM | POA: Diagnosis not present

## 2019-12-03 DIAGNOSIS — N2581 Secondary hyperparathyroidism of renal origin: Secondary | ICD-10-CM | POA: Diagnosis not present

## 2019-12-03 DIAGNOSIS — Z992 Dependence on renal dialysis: Secondary | ICD-10-CM | POA: Diagnosis not present

## 2019-12-03 DIAGNOSIS — D509 Iron deficiency anemia, unspecified: Secondary | ICD-10-CM | POA: Diagnosis not present

## 2019-12-05 DIAGNOSIS — N2581 Secondary hyperparathyroidism of renal origin: Secondary | ICD-10-CM | POA: Diagnosis not present

## 2019-12-05 DIAGNOSIS — Z992 Dependence on renal dialysis: Secondary | ICD-10-CM | POA: Diagnosis not present

## 2019-12-05 DIAGNOSIS — D631 Anemia in chronic kidney disease: Secondary | ICD-10-CM | POA: Diagnosis not present

## 2019-12-05 DIAGNOSIS — N186 End stage renal disease: Secondary | ICD-10-CM | POA: Diagnosis not present

## 2019-12-05 DIAGNOSIS — D509 Iron deficiency anemia, unspecified: Secondary | ICD-10-CM | POA: Diagnosis not present

## 2019-12-08 DIAGNOSIS — Z992 Dependence on renal dialysis: Secondary | ICD-10-CM | POA: Diagnosis not present

## 2019-12-08 DIAGNOSIS — D509 Iron deficiency anemia, unspecified: Secondary | ICD-10-CM | POA: Diagnosis not present

## 2019-12-08 DIAGNOSIS — N2581 Secondary hyperparathyroidism of renal origin: Secondary | ICD-10-CM | POA: Diagnosis not present

## 2019-12-08 DIAGNOSIS — D631 Anemia in chronic kidney disease: Secondary | ICD-10-CM | POA: Diagnosis not present

## 2019-12-08 DIAGNOSIS — N186 End stage renal disease: Secondary | ICD-10-CM | POA: Diagnosis not present

## 2019-12-11 ENCOUNTER — Other Ambulatory Visit: Payer: Self-pay

## 2019-12-11 ENCOUNTER — Encounter: Payer: Medicare Other | Attending: Physician Assistant | Admitting: Physician Assistant

## 2019-12-11 DIAGNOSIS — Z794 Long term (current) use of insulin: Secondary | ICD-10-CM | POA: Insufficient documentation

## 2019-12-11 DIAGNOSIS — L97522 Non-pressure chronic ulcer of other part of left foot with fat layer exposed: Secondary | ICD-10-CM | POA: Insufficient documentation

## 2019-12-11 DIAGNOSIS — F17290 Nicotine dependence, other tobacco product, uncomplicated: Secondary | ICD-10-CM | POA: Diagnosis not present

## 2019-12-11 DIAGNOSIS — Z992 Dependence on renal dialysis: Secondary | ICD-10-CM | POA: Insufficient documentation

## 2019-12-11 DIAGNOSIS — E11621 Type 2 diabetes mellitus with foot ulcer: Secondary | ICD-10-CM | POA: Diagnosis not present

## 2019-12-11 DIAGNOSIS — M86672 Other chronic osteomyelitis, left ankle and foot: Secondary | ICD-10-CM | POA: Diagnosis present

## 2019-12-11 DIAGNOSIS — I10 Essential (primary) hypertension: Secondary | ICD-10-CM | POA: Diagnosis not present

## 2019-12-11 NOTE — Progress Notes (Addendum)
MAGUIRE, SIME ([id number]) Visit Report for 12/11/2019 Chief Complaint Document Details Patient Name: Timothy Gibson, Timothy Gibson Date of Service: 12/11/2019 8:45 AM Medical Record Number: [id number] Patient Account Number: [id number] Date of Birth/Sex: August 26, 1950 (69 y.o. M) Treating RN: Army Melia Primary Care Provider: Merrie Roof Other Clinician: Referring Provider: Merrie Roof Treating Provider/Extender: Melburn Hake, Josedejesus Marcum Weeks in Treatment: 21 Information Obtained from: Patient Chief Complaint Left Great Toe Electronic Signature(s) Signed: 12/11/2019 8:48:57 AM By: Worthy Keeler PA-C Entered By: Worthy Keeler on 12/11/2019 08:48:57 Timothy Gibson, Timothy Gibson ([id number]) -------------------------------------------------------------------------------- Cellular or Tissue Based Product Details Patient Name: Timothy Gibson Date of Service: 12/11/2019 8:45 AM Medical Record Number: [id number] Patient Account Number: [id number] Date of Birth/Sex: 20-Apr-1951 (69 y.o. M) Treating RN: Army Melia Primary Care Provider: Merrie Roof Other Clinician: Referring Provider: Merrie Roof Treating Provider/Extender: Melburn Hake, Carthel Castille Weeks in Treatment: 21 Cellular or Tissue Based Product Type Wound #3 Left Toe Great Applied to: Performed By: Physician [name], Avabella Wailes E., PA-C Cellular or Tissue Based Product Apligraf Type: Level of Consciousness (Pre- Awake and Alert procedure): Pre-procedure Verification/Time Out Yes - 08:55 Taken: Location: genitalia / hands / feet / multiple digits Wound Size (sq cm): 0.55 Product Size (sq cm): 44 Waste Size (sq cm): 34 Waste Reason: wound size Amount of Product Applied (sq cm): 10 Instrument Used: Forceps, Scissors Lot #: [id number] Expiration Date: 12/18/2019 Fenestrated: Yes Instrument: Blade Reconstituted: Yes Solution Type: normal saline Solution Amount: 5ML Lot #: [id number] Solution Expiration Date: 07/10/2021 Secured: Yes Secured With:  Steri-Strips Dressing Applied: Yes Primary Dressing: mepitel one Response to Treatment: Procedure was tolerated well Level of Consciousness (Post- Awake and Alert procedure): Post Procedure Diagnosis Same as Pre-procedure Electronic Signature(s) Signed: 12/11/2019 11:31:22 AM By: Army Melia Entered By: Army Melia on 12/11/2019 09:02:15 Timothy Gibson ([id number]) -------------------------------------------------------------------------------- Debridement Details Patient Name: Timothy Gibson Date of Service: 12/11/2019 8:45 AM Medical Record Number: [id number] Patient Account Number: [id number] Date of Birth/Sex: February 28, 1951 (69 y.o. M) Treating RN: Army Melia Primary Care Provider: Merrie Roof Other Clinician: Referring Provider: Merrie Roof Treating Provider/Extender: Melburn Hake, Kaylyn Garrow Weeks in Treatment: 21 Debridement Performed for Wound #3 Left Toe Great Assessment: Performed By: Physician [name], Khizar Fiorella E., PA-C Debridement Type: Debridement Severity of Tissue Pre Debridement: Fat layer exposed Level of Consciousness (Pre- Awake and Alert procedure): Pre-procedure Verification/Time Out Yes - 08:52 Taken: Start Time: 08:53 Pain Control: Lidocaine Total Area Debrided (L x W): 1.1 (cm) x 0.5 (cm) = 0.55 (cm) Tissue and other material Viable, Non-Viable, Callus, Slough, Subcutaneous, Slough debrided: Level: Skin/Subcutaneous Tissue Debridement Description: Excisional Instrument: Curette Bleeding: Minimum Hemostasis Achieved: Pressure End Time: 08:54 Response to Treatment: Procedure was tolerated well Level of Consciousness (Post- Awake and Alert procedure): Post Debridement Measurements of Total Wound Length: (cm) 1.1 Width: (cm) 0.5 Depth: (cm) 0.3 Volume: (cm) 0.13 Character of Wound/Ulcer Post Debridement: Stable Severity of Tissue Post Debridement: Fat layer exposed Post Procedure Diagnosis Same as Pre-procedure Electronic Signature(s) Signed:  12/11/2019 11:31:22 AM By: Army Melia Signed: 12/12/2019 6:04:00 PM By: Worthy Keeler PA-C Entered By: Army Melia on 12/11/2019 08:53:53 Timothy Gibson ([id number]) -------------------------------------------------------------------------------- HPI Details Patient Name: Timothy Gibson Date of Service: 12/11/2019 8:45 AM Medical Record Number: [id number] Patient Account Number: [id number] Date of Birth/Sex: [birth year] (69 y.o. M) Treating RN: Army Melia Primary Care Provider: Merrie Roof Other Clinician: Referring Provider: Merrie Roof Treating Provider/Extender: Melburn Hake, Joline Encalada Weeks in Treatment: 21 History of Present Illness HPI Description: 11/27/17 on  evaluation today patient presents for initial evaluation concerning an issue that he has been having with his right great toe which began last Thursday 11/22/17. He has a history of diabetes mellitus type to which he has had for greater than 30 years currently he is on insulin. Subsequently he also has a history of hypertension. On physical exam inspection is also appears he may have some peripheral vascular disease with his ABI's being noncompressible bilaterally. He is on dialysis as well and this is on Monday, Wednesday, and Friday. Patient was hospitalized back in January/February 2019 although this was more for stomach/back pain though they never told me exactly what was going on. This is according to the patient. Subsequently the ulcer which is between the third and fourth toe when space of the right foot as well is on the plantar aspect of the fourth toe is due to him having cleaned his toes this morning and he states that his finger which is large split the toe tissue in between causing the wounds that we currently see. With that being said he states this has happened before I explained I would definitely recommend that he not do this any longer. He can use a small soft washcloth to clean between the toes without causing  trauma. Subsequently the right great toe hatchery does show evidence of necrotic tissue present on the surface of the wound specifically there is callous and dead skin surrounding which is trapping fluid causing problems as far as the wound is concerned. The surface of the wound does show slough although due to his vascular flow I'm not going to sharply debride this today I think I will selectively debride away the necrotic skin as well is callous from around the surrounding so this will not continue to be a moisture issue. Nonetheless the patient has no pain he does have diabetic neuropathy. 12/06/17-He is here in follow-up evaluation for a right great toe ulcer. He is voicing no complaints or concerns. He continues to infrequently/socially smokes cigars with no desire to quit. He has been compliant with offloading, using open toed surgical shoe; has been compliant using Santyl daily. He continues on clindamycin although takes it inconsistently secondary to indigestion. The plain film x-ray performed on 5/23 impression: Soft tissue swelling of the left first toe and is noted with probable irregular lucency involving distal tuft of first distal phalanx concerning for osteomyelitis. MRI may be performed for further evaluation; MRI ordered. The vascular evaluation performed on 5/24 field non- compressible ABIs bilaterally and reduced TBI bilaterally suggesting significant tibial disease. He will be referred to vascular medicine for further evaluation. 12/13/17-He is here in follow-up evaluation for right great toe ulcer. He has appointment next Thursday for the MRI and vascular evaluation. Wound culture obtained last week grew abundant Klebsiella oxytoca (multidrug sensitivity), and abundant enteric coccus faecalis (sensitive to ampicillin). Ampicillin and Cipro were called in, along with a probiotic. In light of his appointments next Thursday he will follow-up in 2 weeks, we will continue with  Santyl 12/27/17-He is here for evaluation for a right great toe ulcer. 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 He did have evaluation by vascular medicine, Dr. Ronalee Belts, on 6/13, at that appointment it was decided for him to undergo angiography with possible intervention but he refused and is still considering. If he chooses to have it done he will contact the office. He has not heard back from  either ID offices that he was referred to two weeks ago: [place] and [place]. there is improvement in both appearance and measurement to the ulcer. We will extend antibiotic therapy (amoxicillin 500 every 12, Cipro 500 daily) for additional 2 weeks pending infectious disease consult, he will continue with Santyl daily. He was reminded to continue with probiotic therapy while on oral antibiotics; he denies any GI disturbance. 01/03/18-He is here in follow-up evaluation for right great toe ulcer. He is decided to not undergo any vascular intervention at this time. He was established with [facility] infectious disease (Dr. Linus Salmons) last week initiated on vancomycin and cefazolin with dialysis treatment for 6 weeks. We will continue with current treatment plan with Santyl and offloading and he will follow-up in 2 weeks 01/17/18-He is seen in follow-up evaluation for right great toe ulcer. There is improvement in appearance with less nonviable tissue present. We will continue with same treatment plan he will follow-up next week. He is tolerating IV antibiotics without any complications 09/28/00-RK is seen in follow up evaluation for right great toe ulcer. There continues to be improvement. He is tolerating IV antibiotics with hemodialysis, completion date of 7/31. We will transition to collagen and and follow-up next week 02/07/18-He is here in follow up for a right great toe ulcer. There is red granulation tissue throughout the wound, improved  in appearance. He completed antibiotics yesterday. He saw podiatry last Thursday for nail trimming, at that appointment he periwound callus was trimmed and a culture was obtained; culture negative. He does not have a follow up appointment with infectious disease. We will submit for grafix. 02/14/18-He is here in follow-up evaluation for a right great toe ulcer. There is slow improvement, red granulation tissue throughout. The insurance approval for grafix is pending. We will continue with collagen and he'll follow-up next week 02/21/18-He is seen in follow-up evaluation for right great toe ulcer. There is improvement with red granulation tissue throughout. He was approved for oasis and this was placed today. He will follow up next week 02/28/18-He is seen in follow-up evaluation for right great toe ulcer. The wound is stable, oasis applied and he'll follow-up next week 03/07/18-He is seen in follow-up violation of her right great toe ulcer. The wound is dry, no evidence of drainage. The wound appears healed today. He was painted with Betadine, instructed to paint with Betadine daily, cover with band-aid for the next week and then cover with band-aid for the following week continue with surgical shoe. He was encouraged to contact the clinic with any evidence of drainage, or change in appearance to toe/wound. He will be discharged from wound care services Readmission: 07/14/18 on evaluation today patient presents for initial evaluation our clinic concerning issues that he is having with his left great toe. We previously saw him in 2019 for his right great toe which was actually worse at that time and subsequently was able to be completely healed in the end although it did take several months. With that being said the patient is currently having an area on his left great toe that occurred initially as result of a blister that came up he's not really sure why or how. Subsequently this was initially treated by  his podiatrist though the recommendation there was apparently amputation according to the patient and his daughter who was present with him today. With that being said the patient did not want to proceed with amputation and would like to try to perform wound care and get this to  heal without having to go down that road. We were able to do that with his other toe and so he would like to at least give that a try before going forward with amputation. Again I completely understand his concern in this regard. With that being said I did discuss with the patient obviously that it's always a chance that we cannot get this to heal with normal wound care measures but obviously we will give it our best shot. He does actually have an appointment scheduled for later today with the vascular specialist in order to evaluate his blood flow. That was at the recommendation of his podiatrist as well. Obviously if he is having good arterial flow and nonetheless is still experiencing issues with having the wound delay in healing then it may simply be a matter of we need to initiate more aggressive wound to therapy in order to see if we can get this to heal. No fevers, chills, Timothy Gibson, Timothy Gibson. ([id number]) nausea, or vomiting noted at this time. The patient notes that this woman has been present for approximately three weeks. He is not a smoker. His most recent hemoglobin A1c was 6.2 on 06/18/19. 07/17/2019 on evaluation today patient appears unfortunately to not be doing as well. He did see vascular I did review the note as well. That was on 07/15/2019. Subsequently there can actually be taking him to the OR for an angiogram due to poor arterial flow into this extremity. Apparently the atherosclerotic changes were severe. The patient is stated to be at risk for limb loss. Nonetheless the good news is this is being scheduled fairly quickly he will have the surgery/procedure next Thursday. With that being said I am going also go  ahead based on the results of his x-ray which showed some chance of osteomyelitis in the distal portion of the toe get the patient set up for an MRI to further evaluate for the possibility of osteomyelitis obviously if he does have osteomyelitis we need to know so that we can address this appropriately. 07/29/2019 upon evaluation today patient appears to be doing better in general with regard to his toe ulcer. He does have much better blood flow you can actually feel a palpable pulse which appears to be very strong at this time. I did review his arterial intervention/angiogram which did show that he had evidence of sufficient arterial flow restoration at this point. He did have occlusions that were 60 and 80% that residually were only 10 and 25% with no limiting flow. This is excellent news and hopefully he will continue to show signs of improvement in light of the improved vascular status. With that being said the toe mainly shows somewhat of an eschar today I think that this is fairly stable and as such probably would be best not to really do much in the way of debridement at this point I do believe Betadine would likely be a good option. 08/07/2019 on evaluation today patient actually appears to be doing about the same. The one difference is the eschar that we were just seeing if we can maintain is actually starting to loosen up and leak around the edges I think it is time to actually go ahead and remove this today. Is also no longer stable is very soft. Nonetheless the patient is okay with proceeding with debridement at this point. Fortunately there is no signs of active infection. No fevers, chills, nausea, vomiting, or diarrhea. The patient states that he took his last antibiotic that  he had last night. He would need a refill as he will not be seeing infectious disease until February 2. Nonetheless obviously I do not want him to have any break in therapy especially when he is doing as well as he is  currently. 08/14/2019 on evaluation today patient appears to be doing quite well all things considered in regard to his toe. I do feel like he is making progress and though this is not completely healed by any means we still have some ways to go I feel like he is making progress which is excellent. There is no sign of active infection at this time systemically. He does have osteomyelitis of the distal phalanx of his great toe on the left. He did see Dr. Linus Salmons. That is the infectious disease doctor in [place] where he was referred. Dr. Linus Salmons apparently according to the note did not feel like the patient's MRI revealed osteomyelitis but just reactive marrow and subsequently did not recommend any antibiotic therapy whatsoever. To be peripherally honest I am not really in agreement with this based on the MRI results and what we are seeing and I think he has been responding at least decently well to oral medication as well. Nonetheless we may want to check into the possibility of IV antibiotics being administered at the dialysis center. I will discuss this with Dr. Dellia Nims further. 08/21/2019 upon evaluation today patient appears at this point to be making some progress with regard to his wounds. He has been tolerating the dressing changes without complication. Fortunately there is no evidence of active infection at this time. Overall I feel like he is doing well with the oral antibiotics I discussed with Dr. Dellia Nims the possibility of doing IV antibiotic therapy versus continuing with the oral. His opinion was if the patient is doing well with oral to maybe stick with that. Obviously we can initiate additional Cipro as well just to help ensure that there is nothing worsening here. The Cipro should be beneficial for him as well. That helps cover more gram-negative's were is at the doxycycline is more gram- positive's. 09/02/2019 on evaluation today patient appears to be doing fairly well upon inspection  today. With that being said though the patient is continuing to do well he does also continue to develop issues with buildup of necrotic tissue on the distal portion of his toe he does have good arterial flow however. For that reason I am going to suggest that what we may need to do is consider using something such as Santyl to try to keep this free and clear. Also discussed in greater detail HBO therapy with him today please see plan for additional recommendations and results of this discussion. 09/11/2019 upon evaluation today patient appears to be doing well with regard to his toe ulcer. This actually appears to be much better today using the Santyl than it was previous. Fortunately there is no signs of active infection. No fevers, chills, nausea, vomiting, or diarrhea. 09/18/2019 upon evaluation today patient appears to be doing much better in regard to his wound in general. The Santyl was doing a good job at loosening stuff up here for [place] which is excellent news. With that being said he is still taking the oral antibiotic. I sent this in last for him on 09/03/2019. This was the doxycycline. When this runs out he will actually be complete as far as the treatment is concerned. With that being said I am getting continue the Cipro for 1 additional month.  Overall his wound seems to be doing quite well. 09/25/2019 upon evaluation today patient appears to be doing well with regard to his toe ulcer. This is measuring much better and overall seems to be making good progress. Fortunately there is no signs of active infection at this time. No fevers, chills, nausea, vomiting, or diarrhea. 10/02/2019 upon evaluation today patient appears to be doing well with regard to his wound. He has been tolerating the dressing changes without complication. Fortunately there is no signs of active infection at this time. No fevers, chills, nausea, vomiting, or diarrhea. Overall I am extremely pleased with how things seem to be  progressing. I think the wound bed is at the point now where we can likely proceed with additional measures to try to get this healed more quickly I think Dermagraft may be a good possibility for the patient. We can definitely look into seeing about approval for this. 10/09/2019 upon evaluation today patient's wound actually appears to be doing well although it still is progressing somewhat slowly compared to what I would like to see. With that being said I did order Apligraf for the patient which I felt would keep the wound nice and moist without allowing it to dry out and hopefully allow this to heal much more effectively and quickly. The patient actually did obtain approval and therefore we can apply that today. 10/23/2019 upon evaluation today patient's wound actually appears to be doing significantly better with regard to his toe ulcer. The tissue at the base of the wound is greatly improved and overall I am very pleased with the progress made. We did use Apligraf 2 weeks ago on the toe that did excellent for him much it has completely dissolved after as of the 2 weeks. They only had to change the dressing a few times due to drainage through. Obviously they left the Steri-Strips and Mepitel in place. Unfortunately we do not have the Apligraf available today for application regard therefore have to use a different product here in the clinic today and then order the Apligraf for next Thursday. That will be his second application. 4/22; patient arrived with overhanging skin and subcutaneous tissue on the wound margins this was removed. Apligraf #2 applied 11/13/2019 upon evaluation today patient actually appears to be doing well with regard to his foot ulcer. He has been tolerating the dressing changes without complication. The overall wound bed appears to be healthier and has filled in as well as not nearly as deep as it was the tissue is also better quality. Overall I am extremely pleased with how  things seem to be progressing. The patient is likewise very happy in this regard. 5/20; wound is measuring smaller had Apligraf #3 last time. Apligraf #4 today Timothy Gibson, Timothy Gibson ([id number]) 12/12/2019 upon evaluation today patient actually appears to be doing quite well with regard to his wound in my opinion. The ulcer is significantly smaller compared to last time I saw him although that has been several weeks. I do believe the Apligraf has been beneficial he has 1 remaining today this will be application #5. Electronic Signature(s) Signed: 12/12/2019 5:57:55 PM By: Worthy Keeler PA-C Entered By: Worthy Keeler on 12/12/2019 17:57:55 Timothy Gibson, Timothy Gibson ([id number]) -------------------------------------------------------------------------------- Physical Exam Details Patient Name: Timothy Gibson Date of Service: 12/11/2019 8:45 AM Medical Record Number: [id number] Patient Account Number: [id number] Date of Birth/Sex: 10/24/1950 (69 y.o. M) Treating RN: Army Melia Primary Care Provider: Merrie Roof Other Clinician: Referring Provider: Merrie Roof Treating  Provider/Extender: [name], Rajat Staver Weeks in Treatment: 21 Constitutional Well-nourished and well-hydrated in no acute distress. Respiratory normal breathing without difficulty. Psychiatric this patient is able to make decisions and demonstrates good insight into disease process. Alert and Oriented x 3. pleasant and cooperative. Notes Upon inspection patient's wound did not require any significant sharp debridement though there was some need to remove some of the callus and dry skin around the edge as well as clean the surface of the wound to some degree. He tolerated all this today without complication. Following that hemostasis was achieved and then Apligraf was applied and secured with Mepitel and Steri-Strips. He tolerated all this today without complication. Electronic Signature(s) Signed: 12/12/2019 5:58:28 PM By: Worthy Keeler PA-C Entered By: Worthy Keeler on 12/12/2019 17:58:27 Timothy Gibson, Timothy Gibson ([id number]) -------------------------------------------------------------------------------- Physician Orders Details Patient Name: Timothy Gibson Date of Service: 12/11/2019 8:45 AM Medical Record Number: [id number] Patient Account Number: [id number] Date of Birth/Sex: [birth year] (69 y.o. M) Treating RN: Army Melia Primary Care Provider: Merrie Roof Other Clinician: Referring Provider: Merrie Roof Treating Provider/Extender: Melburn Hake, Stacye Noori Weeks in Treatment: 41 Verbal / Phone Orders: No Diagnosis Coding ICD-10 Coding Code Description [phone] Other chronic osteomyelitis, left ankle and foot E11.621 Type 2 diabetes mellitus with foot ulcer L97.522 Non-pressure chronic ulcer of other part of left foot with fat layer exposed I10 Essential (primary) hypertension Wound Cleansing Wound #3 Left Toe Great o Dial antibacterial soap, wash wounds, rinse and pat dry prior to dressing wounds o May Shower, gently pat wound dry prior to applying new dressing. Anesthetic (add to Medication List) Wound #3 Left Toe Great o Topical Lidocaine 4% cream applied to wound bed prior to debridement (In Clinic Only). Primary Wound Dressing Wound #3 Left Toe Great o Other: - Apligraft applied Secondary Dressing Wound #3 Left Toe Great o Conform/Kerlix o Drawtex o Mepitel One or equivalent Dressing Change Frequency Wound #3 Left Toe Great o Other: - As needed Follow-up Appointments Wound #3 Left Toe Great o Return Appointment in 2 weeks. Advanced Therapies Wound #3 Left Toe Great o Apligraf application in clinic; including contact layer, fixation with steri strips, dry gauze and cover dressing. Electronic Signature(s) Signed: 12/11/2019 11:31:22 AM By: Army Melia Signed: 12/12/2019 6:04:00 PM By: Worthy Keeler PA-C Entered By: Army Melia on 12/11/2019 08:54:21 Timothy Gibson, Timothy Gibson  ([id number]) -------------------------------------------------------------------------------- Problem List Details Patient Name: Timothy Gibson Date of Service: 12/11/2019 8:45 AM Medical Record Number: [id number] Patient Account Number: [id number] Date of Birth/Sex: 1950-08-18 (69 y.o. M) Treating RN: Army Melia Primary Care Provider: Merrie Roof Other Clinician: Referring Provider: Merrie Roof Treating Provider/Extender: Melburn Hake, Izak Anding Weeks in Treatment: 21 Active Problems ICD-10 Encounter Code Description Active Date MDM Diagnosis [phone] Other chronic osteomyelitis, left ankle and foot 07/29/2019 No Yes E11.621 Type 2 diabetes mellitus with foot ulcer 07/15/2019 No Yes L97.522 Non-pressure chronic ulcer of other part of left foot with fat layer 07/15/2019 No Yes exposed [place] (primary) hypertension 07/15/2019 No Yes Inactive Problems Resolved Problems Electronic Signature(s) Signed: 12/11/2019 8:48:50 AM By: Worthy Keeler PA-C Entered By: Worthy Keeler on 12/11/2019 08:48:50 Timothy Gibson ([id number]) -------------------------------------------------------------------------------- Progress Note Details Patient Name: Timothy Gibson Date of Service: 12/11/2019 8:45 AM Medical Record Number: [id number] Patient Account Number: [id number] Date of Birth/Sex: 28-Oct-1950 (69 y.o. M) Treating RN: Army Melia Primary Care Provider: Merrie Roof Other Clinician: Referring Provider: Merrie Roof Treating Provider/Extender: Melburn Hake, Reeve Mallo Weeks in Treatment: 21 Subjective Chief Complaint Information  obtained from Patient Left Great Toe History of Present Illness (HPI) 11/27/17 on evaluation today patient presents for initial evaluation concerning an issue that he has been having with his right great toe which began last Thursday 11/22/17. He has a history of diabetes mellitus type to which he has had for greater than 30 years currently he is on insulin. Subsequently he  also has a history of hypertension. On physical exam inspection is also appears he may have some peripheral vascular disease with his ABI's being noncompressible bilaterally. He is on dialysis as well and this is on Monday, Wednesday, and Friday. Patient was hospitalized back in January/February 2019 although this was more for stomach/back pain though they never told me exactly what was going on. This is according to the patient. Subsequently the ulcer which is between the third and fourth toe when space of the right foot as well is on the plantar aspect of the fourth toe is due to him having cleaned his toes this morning and he states that his finger which is large split the toe tissue in between causing the wounds that we currently see. With that being said he states this has happened before I explained I would definitely recommend that he not do this any longer. He can use a small soft washcloth to clean between the toes without causing trauma. Subsequently the right great toe hatchery does show evidence of necrotic tissue present on the surface of the wound specifically there is callous and dead skin surrounding which is trapping fluid causing problems as far as the wound is concerned. The surface of the wound does show slough although due to his vascular flow I'm not going to sharply debride this today I think I will selectively debride away the necrotic skin as well is callous from around the surrounding so this will not continue to be a moisture issue. Nonetheless the patient has no pain he does have diabetic neuropathy. 12/06/17-He is here in follow-up evaluation for a right great toe ulcer. He is voicing no complaints or concerns. He continues to infrequently/socially smokes cigars with no desire to quit. He has been compliant with offloading, using open toed surgical shoe; has been compliant using Santyl daily. He continues on clindamycin although takes it inconsistently secondary to  indigestion. The plain film x-ray performed on 5/23 impression: Soft tissue swelling of the left first toe and is noted with probable irregular lucency involving distal tuft of first distal phalanx concerning for osteomyelitis. MRI may be performed for further evaluation; MRI ordered. The vascular evaluation performed on 5/24 field non- compressible ABIs bilaterally and reduced TBI bilaterally suggesting significant tibial disease. He will be referred to vascular medicine for further evaluation. 12/13/17-He is here in follow-up evaluation for right great toe ulcer. He has appointment next Thursday for the MRI and vascular evaluation. Wound culture obtained last week grew abundant Klebsiella oxytoca (multidrug sensitivity), and abundant enteric coccus faecalis (sensitive to ampicillin). Ampicillin and Cipro were called in, along with a probiotic. In light of his appointments next Thursday he will follow-up in 2 weeks, we will continue with Santyl 12/27/17-He is here for evaluation for a right great toe ulcer. 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 He did have evaluation by vascular medicine, Dr. Ronalee Belts, on 6/13, at that appointment it was decided for him to undergo angiography with possible intervention but he refused and is still considering. If he chooses to have  it done he will contact the office. He has not heard back from either ID offices that he was referred to two weeks ago: [place] and [place]. there is improvement in both appearance and measurement to the ulcer. We will extend antibiotic therapy (amoxicillin 500 every 12, Cipro 500 daily) for additional 2 weeks pending infectious disease consult, he will continue with Santyl daily. He was reminded to continue with probiotic therapy while on oral antibiotics; he denies any GI disturbance. 01/03/18-He is here in follow-up evaluation for right great toe ulcer. He is  decided to not undergo any vascular intervention at this time. He was established with [facility] infectious disease (Dr. Linus Salmons) last week initiated on vancomycin and cefazolin with dialysis treatment for 6 weeks. We will continue with current treatment plan with Santyl and offloading and he will follow-up in 2 weeks 01/17/18-He is seen in follow-up evaluation for right great toe ulcer. There is improvement in appearance with less nonviable tissue present. We will continue with same treatment plan he will follow-up next week. He is tolerating IV antibiotics without any complications 9/51/88-CZ is seen in follow up evaluation for right great toe ulcer. There continues to be improvement. He is tolerating IV antibiotics with hemodialysis, completion date of 7/31. We will transition to collagen and and follow-up next week 02/07/18-He is here in follow up for a right great toe ulcer. There is red granulation tissue throughout the wound, improved in appearance. He completed antibiotics yesterday. He saw podiatry last Thursday for nail trimming, at that appointment he periwound callus was trimmed and a culture was obtained; culture negative. He does not have a follow up appointment with infectious disease. We will submit for grafix. 02/14/18-He is here in follow-up evaluation for a right great toe ulcer. There is slow improvement, red granulation tissue throughout. The insurance approval for grafix is pending. We will continue with collagen and he'll follow-up next week 02/21/18-He is seen in follow-up evaluation for right great toe ulcer. There is improvement with red granulation tissue throughout. He was approved for oasis and this was placed today. He will follow up next week 02/28/18-He is seen in follow-up evaluation for right great toe ulcer. The wound is stable, oasis applied and he'll follow-up next week 03/07/18-He is seen in follow-up violation of her right great toe ulcer. The wound is dry, no evidence of  drainage. The wound appears healed today. He was painted with Betadine, instructed to paint with Betadine daily, cover with band-aid for the next week and then cover with band-aid for the following week continue with surgical shoe. He was encouraged to contact the clinic with any evidence of drainage, or change in appearance to toe/wound. He will be discharged from wound care services Readmission: 07/14/18 on evaluation today patient presents for initial evaluation our clinic concerning issues that he is having with his left great toe. We previously saw him in 2019 for his right great toe which was actually worse at that time and subsequently was able to be completely healed in the end although it did take several months. With that being said the patient is currently having an area on his left great toe that occurred initially as result of a blister that came up he's not really sure why or how. Subsequently this was initially treated by his podiatrist though the recommendation there was apparently amputation according to the patient and his daughter who was present with him today. With that being said the patient did not want to proceed with amputation  and would like to try to perform wound care and get this to heal without having to go down that road. We were able to do that with his other toe and so he would like to at least give that a try before going forward with amputation. Timothy Gibson, Timothy Gibson ([id number]) Again I completely understand his concern in this regard. With that being said I did discuss with the patient obviously that it's always a chance that we cannot get this to heal with normal wound care measures but obviously we will give it our best shot. He does actually have an appointment scheduled for later today with the vascular specialist in order to evaluate his blood flow. That was at the recommendation of his podiatrist as well. Obviously if he is having good arterial flow and  nonetheless is still experiencing issues with having the wound delay in healing then it may simply be a matter of we need to initiate more aggressive wound to therapy in order to see if we can get this to heal. No fevers, chills, nausea, or vomiting noted at this time. The patient notes that this woman has been present for approximately three weeks. He is not a smoker. His most recent hemoglobin A1c was 6.2 on 06/18/19. 07/17/2019 on evaluation today patient appears unfortunately to not be doing as well. He did see vascular I did review the note as well. That was on 07/15/2019. Subsequently there can actually be taking him to the OR for an angiogram due to poor arterial flow into this extremity. Apparently the atherosclerotic changes were severe. The patient is stated to be at risk for limb loss. Nonetheless the good news is this is being scheduled fairly quickly he will have the surgery/procedure next Thursday. With that being said I am going also go ahead based on the results of his x-ray which showed some chance of osteomyelitis in the distal portion of the toe get the patient set up for an MRI to further evaluate for the possibility of osteomyelitis obviously if he does have osteomyelitis we need to know so that we can address this appropriately. 07/29/2019 upon evaluation today patient appears to be doing better in general with regard to his toe ulcer. He does have much better blood flow you can actually feel a palpable pulse which appears to be very strong at this time. I did review his arterial intervention/angiogram which did show that he had evidence of sufficient arterial flow restoration at this point. He did have occlusions that were 60 and 80% that residually were only 10 and 25% with no limiting flow. This is excellent news and hopefully he will continue to show signs of improvement in light of the improved vascular status. With that being said the toe mainly shows somewhat of an eschar today I  think that this is fairly stable and as such probably would be best not to really do much in the way of debridement at this point I do believe Betadine would likely be a good option. 08/07/2019 on evaluation today patient actually appears to be doing about the same. The one difference is the eschar that we were just seeing if we can maintain is actually starting to loosen up and leak around the edges I think it is time to actually go ahead and remove this today. Is also no longer stable is very soft. Nonetheless the patient is okay with proceeding with debridement at this point. Fortunately there is no signs of active infection. No fevers, chills,  nausea, vomiting, or diarrhea. The patient states that he took his last antibiotic that he had last night. He would need a refill as he will not be seeing infectious disease until February 2. Nonetheless obviously I do not want him to have any break in therapy especially when he is doing as well as he is currently. 08/14/2019 on evaluation today patient appears to be doing quite well all things considered in regard to his toe. I do feel like he is making progress and though this is not completely healed by any means we still have some ways to go I feel like he is making progress which is excellent. There is no sign of active infection at this time systemically. He does have osteomyelitis of the distal phalanx of his great toe on the left. He did see Dr. Linus Salmons. That is the infectious disease doctor in [place] where he was referred. Dr. Linus Salmons apparently according to the note did not feel like the patient's MRI revealed osteomyelitis but just reactive marrow and subsequently did not recommend any antibiotic therapy whatsoever. To be peripherally honest I am not really in agreement with this based on the MRI results and what we are seeing and I think he has been responding at least decently well to oral medication as well. Nonetheless we may want to check into  the possibility of IV antibiotics being administered at the dialysis center. I will discuss this with Dr. Dellia Nims further. 08/21/2019 upon evaluation today patient appears at this point to be making some progress with regard to his wounds. He has been tolerating the dressing changes without complication. Fortunately there is no evidence of active infection at this time. Overall I feel like he is doing well with the oral antibiotics I discussed with Dr. Dellia Nims the possibility of doing IV antibiotic therapy versus continuing with the oral. His opinion was if the patient is doing well with oral to maybe stick with that. Obviously we can initiate additional Cipro as well just to help ensure that there is nothing worsening here. The Cipro should be beneficial for him as well. That helps cover more gram-negative's were is at the doxycycline is more gram- positive's. 09/02/2019 on evaluation today patient appears to be doing fairly well upon inspection today. With that being said though the patient is continuing to do well he does also continue to develop issues with buildup of necrotic tissue on the distal portion of his toe he does have good arterial flow however. For that reason I am going to suggest that what we may need to do is consider using something such as Santyl to try to keep this free and clear. Also discussed in greater detail HBO therapy with him today please see plan for additional recommendations and results of this discussion. 09/11/2019 upon evaluation today patient appears to be doing well with regard to his toe ulcer. This actually appears to be much better today using the Santyl than it was previous. Fortunately there is no signs of active infection. No fevers, chills, nausea, vomiting, or diarrhea. 09/18/2019 upon evaluation today patient appears to be doing much better in regard to his wound in general. The Santyl was doing a good job at loosening stuff up here for [place] which is excellent  news. With that being said he is still taking the oral antibiotic. I sent this in last for him on 09/03/2019. This was the doxycycline. When this runs out he will actually be complete as far as the treatment is concerned.  With that being said I am getting continue the Cipro for 1 additional month. Overall his wound seems to be doing quite well. 09/25/2019 upon evaluation today patient appears to be doing well with regard to his toe ulcer. This is measuring much better and overall seems to be making good progress. Fortunately there is no signs of active infection at this time. No fevers, chills, nausea, vomiting, or diarrhea. 10/02/2019 upon evaluation today patient appears to be doing well with regard to his wound. He has been tolerating the dressing changes without complication. Fortunately there is no signs of active infection at this time. No fevers, chills, nausea, vomiting, or diarrhea. Overall I am extremely pleased with how things seem to be progressing. I think the wound bed is at the point now where we can likely proceed with additional measures to try to get this healed more quickly I think Dermagraft may be a good possibility for the patient. We can definitely look into seeing about approval for this. 10/09/2019 upon evaluation today patient's wound actually appears to be doing well although it still is progressing somewhat slowly compared to what I would like to see. With that being said I did order Apligraf for the patient which I felt would keep the wound nice and moist without allowing it to dry out and hopefully allow this to heal much more effectively and quickly. The patient actually did obtain approval and therefore we can apply that today. 10/23/2019 upon evaluation today patient's wound actually appears to be doing significantly better with regard to his toe ulcer. The tissue at the base of the wound is greatly improved and overall I am very pleased with the progress made. We did use  Apligraf 2 weeks ago on the toe that did excellent for him much it has completely dissolved after as of the 2 weeks. They only had to change the dressing a few times due to drainage through. Obviously they left the Steri-Strips and Mepitel in place. Unfortunately we do not have the Apligraf available today for application regard therefore have to use a different product here in the clinic today and then order the Apligraf for next Thursday. That will be his second application. 4/22; patient arrived with overhanging skin and subcutaneous tissue on the wound margins this was removed. Apligraf #2 applied Timothy Gibson, Timothy Gibson ([id number]) 11/13/2019 upon evaluation today patient actually appears to be doing well with regard to his foot ulcer. He has been tolerating the dressing changes without complication. The overall wound bed appears to be healthier and has filled in as well as not nearly as deep as it was the tissue is also better quality. Overall I am extremely pleased with how things seem to be progressing. The patient is likewise very happy in this regard. 5/20; wound is measuring smaller had Apligraf #3 last time. Apligraf #4 today 12/12/2019 upon evaluation today patient actually appears to be doing quite well with regard to his wound in my opinion. The ulcer is significantly smaller compared to last time I saw him although that has been several weeks. I do believe the Apligraf has been beneficial he has 1 remaining today this will be application #5. Objective Constitutional Well-nourished and well-hydrated in no acute distress. Vitals Time Taken: 8:38 AM, Height: 76 in, Weight: 244 lbs, BMI: 29.7, Temperature: 98.2 F, Pulse: 134 bpm, Respiratory Rate: 16 breaths/min, Blood Pressure: 149/81 mmHg. Respiratory normal breathing without difficulty. Psychiatric this patient is able to make decisions and demonstrates good insight into disease process.  Alert and Oriented x 3. pleasant and  cooperative. General Notes: Upon inspection patient's wound did not require any significant sharp debridement though there was some need to remove some of the callus and dry skin around the edge as well as clean the surface of the wound to some degree. He tolerated all this today without complication. Following that hemostasis was achieved and then Apligraf was applied and secured with Mepitel and Steri-Strips. He tolerated all this today without complication. Integumentary (Hair, Skin) Wound #3 status is Open. Original cause of wound was Gradually Appeared. The wound is located on the Left Toe Great. The wound measures 1.1cm length x 0.5cm width x 0.3cm depth; 0.432cm^2 area and 0.13cm^3 volume. There is Fat Layer (Subcutaneous Tissue) Exposed exposed. There is no tunneling or undermining noted. There is a medium amount of serous drainage noted. The wound margin is thickened. There is large (67-100%) pink granulation within the wound bed. There is a small (1-33%) amount of necrotic tissue within the wound bed including Adherent Slough. 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 There was a Excisional Skin/Subcutaneous Tissue Debridement with a total area of 0.55 sq cm performed by [name], Patrisia Faeth E., PA-C. With the following instrument(s): Curette to remove Viable and Non-Viable tissue/material. Material removed includes Callus, Subcutaneous Tissue, and Slough after achieving pain control using Lidocaine. A time out was conducted at 08:52, prior to the start of the procedure. A Minimum amount of bleeding was  controlled with Pressure. The procedure was tolerated well. Post Debridement Measurements: 1.1cm length x 0.5cm width x 0.3cm depth; 0.13cm^3 volume. Character of Wound/Ulcer Post Debridement is stable. Severity of Tissue Post Debridement is: Fat layer exposed. Post procedure Diagnosis Wound #3: Same as Pre-Procedure Timothy Gibson, [place] ([id number]) Pre-procedure diagnosis of Wound #3 is a Diabetic Wound/Ulcer of the Lower Extremity located on the Left Toe Great. A skin graft procedure using a bioengineered skin substitute/cellular or tissue based product was performed by [name], Zaira Iacovelli E., PA-C with the following instrument (s): Forceps and Scissors. Apligraf was applied and secured with Steri-Strips. 10 sq cm of product was utilized and 34 sq cm was wasted due to wound size. Post Application, mepitel one was applied. A Time Out was conducted at 08:55, prior to the start of the procedure. The procedure was tolerated well. Post procedure Diagnosis Wound #3: Same as Pre-Procedure . Plan Wound Cleansing: Wound #3 Left Toe Great: Dial antibacterial soap, wash wounds, rinse and pat dry prior to dressing wounds May Shower, gently pat wound dry prior to applying new dressing. Anesthetic (add to Medication List): Wound #3 Left Toe Great: Topical Lidocaine 4% cream applied to wound bed prior to debridement (In Clinic Only). Primary Wound Dressing: Wound #3 Left Toe Great: Other: - Apligraft applied Secondary Dressing: Wound #3 Left Toe Great: Conform/Kerlix Drawtex Mepitel One or equivalent Dressing Change Frequency: Wound #3 Left Toe Great: Other: - As needed Follow-up Appointments: Wound #3 Left Toe Great: Return Appointment in 2 weeks. Advanced Therapies: Wound #3 Left Toe Great: Apligraf application in clinic; including contact layer, fixation with steri strips, dry gauze and cover dressing. 1. I would recommend currently that we go  ahead and continue with the wound care measures as  before specifically with regard to the Apligraf which I think has done well today as his last application. 2. I am also can recommend that he continue to monitor for any signs of infection obviously this will mainly include pain since he cannot see the wound very well but obviously if he has excessive drainage to let me know that as well. We will see patient back for reevaluation in 2 weeks here in the clinic. If anything worsens or changes patient will contact our office for additional recommendations. Electronic Signature(s) Signed: 12/12/2019 5:59:11 PM By: Worthy Keeler PA-C Entered By: Worthy Keeler on 12/12/2019 17:59:11 HEZIKIAH, RETZLOFF ([id number]) -------------------------------------------------------------------------------- SuperBill Details Patient Name: Timothy Gibson Date of Service: 12/11/2019 Medical Record Number: [id number] Patient Account Number: [id number] Date of Birth/Sex: Aug 19, 1950 (69 y.o. M) Treating RN: Army Melia Primary Care Provider: Merrie Roof Other Clinician: Referring Provider: Merrie Roof Treating Provider/Extender: Melburn Hake, Corinthian Mizrahi Weeks in Treatment: 21 Diagnosis Coding ICD-10 Codes Code Description [phone] Other chronic osteomyelitis, left ankle and foot E11.621 Type 2 diabetes mellitus with foot ulcer L97.522 Non-pressure chronic ulcer of other part of left foot with fat layer exposed I10 Essential (primary) hypertension Facility Procedures CPT4 Code: [id number] Description: [phone] (Facility Use Only) Apligraf 44 SQ CM Modifier: Quantity: Basco Code: [id number] Description: 76720 - SKIN SUB GRAFT FACE/NK/HF/G Modifier: Quantity: 1 CPT4 Code: Description: ICD-10 Diagnosis Description L97.522 Non-pressure chronic ulcer of other part of left foot with fat layer expo Modifier: sed Quantity: Physician Procedures CPT4 Code: 9470962 Description: 83662 - WC PHYS SKIN SUB GRAFT FACE/NK/HF/G Modifier: Quantity: 1 CPT4 Code: Description:  ICD-10 Diagnosis Description L97.522 Non-pressure chronic ulcer of other part of left foot with fat layer expos Modifier: ed Quantity: Electronic Signature(s) Signed: 12/11/2019 6:11:50 PM By: Worthy Keeler PA-C Entered By: Worthy Keeler on 12/11/2019 18:11:50

## 2019-12-11 NOTE — Progress Notes (Signed)
Timothy Gibson ([id number]) Visit Report for 12/11/2019 Arrival Information Details Patient Name: Timothy Gibson Date of Service: 12/11/2019 8:45 AM Medical Record Number: [id number] Patient Account Number: [id number] Date of Birth/Sex: 05/18/1951 (69 y.o. M) Treating Gibson: Timothy Gibson Primary Care Timothy Gibson: Timothy Gibson Other Clinician: Referring Timothy Gibson: Timothy Gibson Timothy Gibson: Timothy Gibson Timothy Gibson: 21 Visit Information History Since Last Visit Added or deleted any medications: No Patient Arrived: Cane Any new allergies or adverse reactions: No Arrival Time: 08:38 Had a fall or experienced change in No Accompanied By: self activities of daily living that may affect Transfer Assistance: None risk of falls: Patient Identification Verified: Yes Signs or symptoms of abuse/neglect since last visito No Secondary Verification Process Completed: Yes Hospitalized since last visit: No Patient Requires Transmission-Based Precautions: No Implantable device outside of the clinic excluding No cellular tissue based products placed in the center since last visit: Has Dressing in Place as Prescribed: Yes Pain Present Now: No Electronic Signature(s) Signed: 12/11/2019 4:34:20 PM By: Timothy Gibson Entered By: Timothy Gibson on 12/11/2019 08:38:51 Timothy Gibson ([id number]) -------------------------------------------------------------------------------- Encounter Discharge Information Details Patient Name: Timothy Gibson Date of Service: 12/11/2019 8:45 AM Medical Record Number: [id number] Patient Account Number: [id number] Date of Birth/Sex: 1951-06-12 (69 y.o. M) Treating Gibson: Timothy Gibson Primary Care Timothy Gibson: Timothy Gibson Other Clinician: Referring Timothy Gibson: Timothy Gibson Timothy Kahmya Pinkham/Extender: Timothy Gibson Timothy Gibson: 21 Encounter Discharge Information Items Post Procedure Vitals Discharge Condition: Stable Temperature (F):  98.2 Ambulatory Status: Ambulatory Pulse (bpm): 130 Discharge Destination: Home Respiratory Rate (breaths/min): 16 Transportation: Private Auto Blood Pressure (mmHg): 149/81 Accompanied By: self Schedule Follow-up Appointment: Yes Clinical Summary of Care: Electronic Signature(s) Signed: 12/11/2019 11:31:22 AM By: Timothy Gibson Entered By: Timothy Gibson on 12/11/2019 09:03:47 Timothy Gibson ([id number]) -------------------------------------------------------------------------------- Lower Extremity Assessment Details Patient Name: Timothy Gibson Date of Service: 12/11/2019 8:45 AM Medical Record Number: [id number] Patient Account Number: [id number] Date of Birth/Sex: 01-24-1951 (69 y.o. M) Treating Gibson: Timothy Gibson Primary Care Timothy Gibson: Timothy Gibson Other Clinician: Referring Timothy Gibson: Timothy Gibson Timothy Gibson: Timothy Gibson Timothy Gibson: 21 Edema Assessment Assessed: [Left: No] [Right: No] Edema: [Left: Ye] [Right: s] Vascular Assessment Pulses: Dorsalis Pedis Palpable: [Left:Yes] Electronic Signature(s) Signed: 12/11/2019 4:34:20 PM By: Timothy Gibson Entered By: Timothy Gibson on 12/11/2019 08:44:33 Timothy Gibson ([id number]) -------------------------------------------------------------------------------- Multi Wound Chart Details Patient Name: Timothy Gibson Date of Service: 12/11/2019 8:45 AM Medical Record Number: [id number] Patient Account Number: [id number] Date of Birth/Sex: 11/21/1950 (69 y.o. M) Treating Gibson: Timothy Gibson Primary Care Lauranne Beyersdorf: Timothy Gibson Other Clinician: Referring Timothy Gibson: Timothy Gibson Timothy Gibson: Timothy Gibson Timothy Gibson: 21 Vital Signs Height(in): 76 Pulse(bpm): 134 Weight(lbs): 244 Blood Pressure(mmHg): 149/81 Body Mass Index(BMI): 30 Temperature(F): 98.2 Respiratory Rate(breaths/min): 16 Photos: [N/A:N/A] Wound Location: Left Toe Great N/A N/A Wounding Event: Gradually  Appeared N/A N/A Primary Etiology: Diabetic Wound/Ulcer of the Lower N/A N/A Extremity Comorbid History: Lymphedema, Arrhythmia, N/A N/A Hypertension, Type II Diabetes, End Stage Renal Disease, Rheumatoid Arthritis, Osteoarthritis, Confinement Anxiety Date Acquired: 06/19/2019 N/A N/A Timothy of Gibson: 21 N/A N/A Wound Status: Open N/A N/A Measurements L x W x D (cm) 1.1x0.5x0.3 N/A N/A Area (cm) : 0.432 N/A N/A Volume (cm) : 0.13 N/A N/A % Reduction in Area: 84.10% N/A N/A % Reduction in Volume: 52.00% N/A N/A Classification: Grade 2 N/A N/A Exudate Amount: Medium N/A N/A Exudate Type: Serous N/A N/A Exudate Color: amber N/A N/A Wound Margin: Thickened N/A N/A Granulation  Amount: Large (67-100%) N/A N/A Granulation Quality: Pink N/A N/A Necrotic Amount: Small (1-33%) N/A N/A Exposed Structures: Fat Layer (Subcutaneous Tissue) N/A N/A Exposed: Yes Fascia: No Tendon: No Muscle: No Joint: No Bone: No Epithelialization: Small (1-33%) N/A N/A Gibson Notes Electronic Signature(s) Signed: 12/11/2019 11:31:22 AM By: Timothy Gibson Entered By: Timothy Gibson on 12/11/2019 08:51:11 Timothy Gibson ([id number]) Timothy Gibson ([id number]) -------------------------------------------------------------------------------- [place] Details Patient Name: Timothy Gibson Date of Service: 12/11/2019 8:45 AM Medical Record Number: [id number] Patient Account Number: [id number] Date of Birth/Sex: [birth year] (69 y.o. M) Treating Gibson: Timothy Gibson Primary Care Timothy Gibson: Timothy Gibson Other Clinician: Referring Timothy Gibson: Timothy Gibson Timothy Gibson: Timothy Gibson Timothy Gibson: 21 Active Inactive Abuse / Safety / Falls / Self Care Management Nursing Diagnoses: Potential for falls Goals: Patient will not experience any injury related to falls Date Initiated: 07/15/2019 Target Resolution Date: 10/18/2019 Goal Status: Active Interventions: Assess  fall risk on admission and as needed Notes: Necrotic Tissue Nursing Diagnoses: Impaired tissue integrity related to necrotic/devitalized tissue Goals: Necrotic/devitalized tissue will be minimized in the wound bed Date Initiated: 07/15/2019 Target Resolution Date: 10/18/2019 Goal Status: Active Interventions: Provide education on necrotic tissue and debridement process Notes: Orientation to the Wound Care Program Nursing Diagnoses: Knowledge deficit related to the wound healing center program Goals: Patient/caregiver will verbalize understanding of the [place] [facility] Date Initiated: 07/15/2019 Target Resolution Date: 10/18/2019 Goal Status: Active Interventions: Provide education on orientation to the wound center Notes: Peripheral Neuropathy Nursing Diagnoses: Knowledge deficit related to disease process and management of peripheral neurovascular dysfunction Goals: Patient/caregiver will verbalize understanding of disease process and disease management Date Initiated: 07/15/2019 Target Resolution Date: 10/18/2019 Goal Status: Active MASIN, SHATTO ([id number]) Interventions: Assess signs and symptoms of neuropathy upon admission and as needed Provide education on Management of Neuropathy and Related Ulcers Notes: Wound/Skin Impairment Nursing Diagnoses: Impaired tissue integrity Goals: Ulcer/skin breakdown will heal within 14 Timothy Date Initiated: 07/15/2019 Target Resolution Date: 10/18/2019 Goal Status: Active Interventions: Assess patient/caregiver ability to obtain necessary supplies Assess patient/caregiver ability to perform ulcer/skin care regimen upon admission and as needed Assess ulceration(s) every visit Notes: Electronic Signature(s) Signed: 12/11/2019 11:31:22 AM By: Timothy Gibson Entered By: Timothy Gibson on 12/11/2019 08:51:01 Timothy Gibson ([id number]) -------------------------------------------------------------------------------- Pain  Assessment Details Patient Name: Timothy Gibson Date of Service: 12/11/2019 8:45 AM Medical Record Number: [id number] Patient Account Number: [id number] Date of Birth/Sex: June 02, 1951 (69 y.o. M) Treating Gibson: Timothy Gibson Primary Care Cicilia Clinger: Timothy Gibson Other Clinician: Referring Missy Baksh: Timothy Gibson Timothy Glorie Dowlen/Extender: Timothy Gibson Timothy Gibson: 21 Active Problems Location of Pain Severity and Description of Pain Patient Has Paino No Site Locations Pain Management and Medication Current Pain Management: Electronic Signature(s) Signed: 12/11/2019 4:34:20 PM By: Timothy Gibson Entered By: Timothy Gibson on 12/11/2019 08:39:16 Timothy Gibson ([id number]) -------------------------------------------------------------------------------- Patient/Caregiver Education Details Patient Name: Timothy Gibson Date of Service: 12/11/2019 8:45 AM Medical Record Number: [id number] Patient Account Number: [id number] Date of Birth/Gender: 1950-08-11 (69 y.o. M) Treating Gibson: Timothy Gibson Primary Care Physician: Timothy Gibson Other Clinician: Referring Physician: Merrie Gibson Timothy Physician/Extender: Sharalyn Ink in Gibson: 21 Education Assessment Education Provided To: Patient Education Topics Provided Wound/Skin Impairment: Handouts: Caring for Your Ulcer Methods: Demonstration, Explain/Verbal Responses: State content correctly Electronic Signature(s) Signed: 12/11/2019 11:31:22 AM By: Timothy Gibson Entered By: Timothy Gibson on 12/11/2019 09:03:05 Timothy Gibson ([id number]) -------------------------------------------------------------------------------- Wound Assessment Details Patient Name: Timothy Gibson Date of Service: 12/11/2019  8:45 AM Medical Record Number: [id number] Patient Account Number: [id number] Date of Birth/Sex: December 10, 1950 (69 y.o. M) Treating Gibson: Timothy Gibson Primary Care Flay Ghosh: Timothy Gibson Other Clinician: Referring  Josephine Wooldridge: Timothy Gibson Timothy Lexa Coronado/Extender: Timothy Gibson Timothy Gibson: 21 Wound Status Wound Number: 3 Primary Diabetic Wound/Ulcer of the Lower Extremity Etiology: Wound Location: Left Toe Great Wound Open Wounding Event: Gradually Appeared Status: Date Acquired: 06/19/2019 Comorbid Lymphedema, Arrhythmia, Hypertension, Type II Timothy Of Gibson: 21 History: Diabetes, End Stage Renal Disease, Rheumatoid Arthritis, Clustered Wound: No Osteoarthritis, Confinement Anxiety Photos Wound Measurements Length: (cm) 1.1 % Redu Width: (cm) 0.5 % Redu Depth: (cm) 0.3 Epithe Area: (cm) 0.432 [place]ne Volume: (cm) 0.13 Under ction in Area: 84.1% ction in Volume: 52% lialization: Small (1-33%) ling: No mining: No Wound Description Classification: Grade 2 Foul Wound Margin: Thickened Sloug Exudate Amount: Medium Exudate Type: Serous Exudate Color: amber Odor After Cleansing: No h/Fibrino Yes Wound Bed Granulation Amount: Large (67-100%) Exposed Structure Granulation Quality: Pink Fascia Exposed: No Necrotic Amount: Small (1-33%) Fat Layer (Subcutaneous Tissue) Exposed: Yes Necrotic Quality: Adherent Slough Tendon Exposed: No Muscle Exposed: No Joint Exposed: No Bone Exposed: No Gibson Notes Wound #3 (Left Toe Great) Notes apligraft, drawtex conform, offloading shoe Electronic Signature(s) GREELY, ATIYEH ([id number]) Signed: 12/11/2019 4:34:20 PM By: Timothy Gibson Entered By: Timothy Gibson on 12/11/2019 08:43:37 Timothy Gibson ([id number]) -------------------------------------------------------------------------------- Vitals Details Patient Name: Timothy Gibson Date of Service: 12/11/2019 8:45 AM Medical Record Number: [id number] Patient Account Number: [id number] Date of Birth/Sex: [birth year] (69 y.o. M) Treating Gibson: Timothy Gibson Primary Care Shaye Lagace: Timothy Gibson Other Clinician: Referring Jasean Ambrosia: Timothy Gibson Timothy  Aqueelah Cotrell/Extender: Timothy Gibson Timothy Gibson: 21 Vital Signs Time Taken: 08:38 Temperature (F): 98.2 Height (in): 76 Pulse (bpm): 134 Weight (lbs): 244 Respiratory Rate (breaths/min): 16 Body Mass Index (BMI): 29.7 Blood Pressure (mmHg): 149/81 Reference Range: 80 - 120 mg / dl Electronic Signature(s) Signed: 12/11/2019 4:34:20 PM By: Timothy Gibson Entered By: Timothy Gibson on 12/11/2019 08:39:09

## 2019-12-18 ENCOUNTER — Other Ambulatory Visit: Payer: Self-pay | Admitting: Family Medicine

## 2019-12-18 DIAGNOSIS — B351 Tinea unguium: Secondary | ICD-10-CM | POA: Diagnosis not present

## 2019-12-18 DIAGNOSIS — E1140 Type 2 diabetes mellitus with diabetic neuropathy, unspecified: Secondary | ICD-10-CM | POA: Diagnosis not present

## 2019-12-18 DIAGNOSIS — Z794 Long term (current) use of insulin: Secondary | ICD-10-CM | POA: Diagnosis not present

## 2019-12-19 NOTE — Progress Notes (Signed)
ZEIN, HELBING ([id number]) Visit Report for 10/23/2019 Arrival Information Details Patient Name: Timothy Gibson, Timothy Gibson Date of Service: 10/23/2019 10:15 AM Medical Record Number: [id number] Patient Account Number: [id number] Date of Birth/Sex: 12-20-1950 (69 y.o. M) Treating RN: Cornell Barman Primary Care Kelisha Dall: Merrie Roof Other Clinician: Referring Madalaine Portier: Merrie Roof Treating Rosi Secrist/Extender: Melburn Hake, [name] Weeks in Treatment: 14 Visit Information History Since Last Visit Added or deleted any medications: No Patient Arrived: Cane Has Dressing in Place as Prescribed: Yes Arrival Time: 09:54 Has Footwear/Offloading in Place as Prescribed: Yes Accompanied By: caregiver Left: Surgical Shoe with Pressure Relief Transfer Assistance: None Insole Patient Identification Verified: Yes Pain Present Now: No Secondary Verification Process Completed: Yes Patient Requires Transmission-Based Precautions: No Electronic Signature(s) Signed: 12/19/2019 12:59:55 PM By: Gretta Cool, BSN, RN, CWS, Kim RN, BSN Entered By: Gretta Cool, BSN, RN, CWS, Kim on 10/23/2019 10:00:48 Timothy Gibson ([id number]) -------------------------------------------------------------------------------- Clinic Level of Care Assessment Details Patient Name: Timothy Gibson Date of Service: 10/23/2019 10:15 AM Medical Record Number: [id number] Patient Account Number: [id number] Date of Birth/Sex: Sep 03, 1950 (68 y.o. M) Treating RN: Army Melia Primary Care Bailea Beed: Merrie Roof Other Clinician: Referring Milo Solana: Merrie Roof Treating Ivoree Felmlee/Extender: Melburn Hake, [name] Weeks in Treatment: 14 Clinic Level of Care Assessment Items TOOL 4 Quantity Score []  - Use when only an EandM is performed on FOLLOW-UP visit 0 ASSESSMENTS - Nursing Assessment / Reassessment X - Reassessment of Co-morbidities (includes updates in patient status) 1 10 X- 1 5 Reassessment of Adherence to Treatment Plan ASSESSMENTS - Wound and Skin  Assessment / Reassessment X - Simple Wound Assessment / Reassessment - one wound 1 5 []  - 0 Complex Wound Assessment / Reassessment - multiple wounds []  - 0 Dermatologic / Skin Assessment (not related to wound area) ASSESSMENTS - Focused Assessment []  - Circumferential Edema Measurements - multi extremities 0 []  - 0 Nutritional Assessment / Counseling / Intervention []  - 0 Lower Extremity Assessment (monofilament, tuning fork, pulses) []  - 0 Peripheral Arterial Disease Assessment (using hand held doppler) ASSESSMENTS - Ostomy and/or Continence Assessment and Care []  - Incontinence Assessment and Management 0 []  - 0 Ostomy Care Assessment and Management (repouching, etc.) PROCESS - Coordination of Care X - Simple Patient / Family Education for ongoing care 1 15 []  - 0 Complex (extensive) Patient / Family Education for ongoing care []  - 0 Staff obtains Programmer, systems, Records, Test Results / Process Orders []  - 0 Staff telephones HHA, Nursing Homes / Clarify orders / etc []  - 0 Routine Transfer to another Facility (non-emergent condition) []  - 0 Routine Hospital Admission (non-emergent condition) []  - 0 New Admissions / Biomedical engineer / Ordering NPWT, Apligraf, etc. []  - 0 Emergency Hospital Admission (emergent condition) X- 1 10 Simple Discharge Coordination []  - 0 Complex (extensive) Discharge Coordination PROCESS - Special Needs []  - Pediatric / Minor Patient Management 0 []  - 0 Isolation Patient Management []  - 0 Hearing / Language / Visual special needs []  - 0 Assessment of Community assistance (transportation, D/C planning, etc.) []  - 0 Additional assistance / Altered mentation []  - 0 Support Surface(s) Assessment (bed, cushion, seat, etc.) INTERVENTIONS - Wound Cleansing / Measurement Timothy Gibson, PAVEK. ([id number]) X- 1 5 Simple Wound Cleansing - one wound []  - 0 Complex Wound Cleansing - multiple wounds X- 1 5 Wound Imaging (photographs - any number  of wounds) []  - 0 Wound Tracing (instead of photographs) X- 1 5 Simple Wound Measurement - one wound []  - 0 Complex Wound Measurement - multiple wounds  INTERVENTIONS - Wound Dressings []  - Small Wound Dressing one or multiple wounds 0 X- 1 15 Medium Wound Dressing one or multiple wounds []  - 0 Large Wound Dressing one or multiple wounds []  - 0 Application of Medications - topical []  - 0 Application of Medications - injection INTERVENTIONS - Miscellaneous []  - External ear exam 0 []  - 0 Specimen Collection (cultures, biopsies, blood, body fluids, etc.) []  - 0 Specimen(s) / Culture(s) sent or taken to Lab for analysis []  - 0 Patient Transfer (multiple staff / Harrel Lemon Lift / Similar devices) []  - 0 Simple Staple / Suture removal (25 or less) []  - 0 Complex Staple / Suture removal (26 or more) []  - 0 Hypo / Hyperglycemic Management (close monitor of Blood Glucose) []  - 0 Ankle / Brachial Index (ABI) - do not check if billed separately X- 1 5 Vital Signs Has the patient been seen at the hospital within the last three years: Yes Total Score: 80 Level Of Care: New/Established - Level 3 Electronic Signature(s) Signed: 10/23/2019 3:28:23 PM By: Army Melia Entered By: Army Melia on 10/23/2019 10:35:57 Timothy Gibson ([id number]) -------------------------------------------------------------------------------- Encounter Discharge Information Details Patient Name: Timothy Gibson Date of Service: 10/23/2019 10:15 AM Medical Record Number: [id number] Patient Account Number: [id number] Date of Birth/Sex: 1950/11/17 (69 y.o. M) Treating RN: Army Melia Primary Care Gricel Copen: Merrie Roof Other Clinician: Referring Jabori Henegar: Merrie Roof Treating Aliviana Burdell/Extender: Melburn Hake, [name] Weeks in Treatment: 14 Encounter Discharge Information Items Discharge Condition: Stable Ambulatory Status: Ambulatory Discharge Destination: Home Transportation: Private Auto Accompanied By:  family Schedule Follow-up Appointment: Yes Clinical Summary of Care: Electronic Signature(s) Signed: 10/23/2019 3:28:23 PM By: Army Melia Entered By: Army Melia on 10/23/2019 10:36:42 Timothy Gibson ([id number]) -------------------------------------------------------------------------------- Lower Extremity Assessment Details Patient Name: Timothy Gibson Date of Service: 10/23/2019 10:15 AM Medical Record Number: [id number] Patient Account Number: [id number] Date of Birth/Sex: Nov 08, 1950 (68 y.o. M) Treating RN: Cornell Barman Primary Care Phila Shoaf: Merrie Roof Other Clinician: Referring Samel Bruna: Merrie Roof Treating Xavia Kniskern/Extender: Melburn Hake, [name] Weeks in Treatment: 14 Edema Assessment Assessed: [Left: No] [Right: No] Edema: [Left: Ye] [Right: s] Vascular Assessment Pulses: Dorsalis Pedis Palpable: [Left:Yes] Electronic Signature(s) Signed: 12/19/2019 12:59:55 PM By: Gretta Cool, BSN, RN, CWS, Kim RN, BSN Entered By: Gretta Cool, BSN, RN, CWS, Kim on 10/23/2019 10:09:49 Timothy Gibson, Timothy Gibson ([id number]) -------------------------------------------------------------------------------- Multi Wound Chart Details Patient Name: Timothy Gibson Date of Service: 10/23/2019 10:15 AM Medical Record Number: [id number] Patient Account Number: [id number] Date of Birth/Sex: 1951-01-17 (68 y.o. M) Treating RN: Army Melia Primary Care Mineola Duan: Merrie Roof Other Clinician: Referring Milah Recht: Merrie Roof Treating Marijean Montanye/Extender: Melburn Hake, [name] Weeks in Treatment: 14 Vital Signs Height(in): 76 Pulse(bpm): 86 Weight(lbs): 244 Blood Pressure(mmHg): 230/80 Body Mass Index(BMI): 30 Temperature(F): 98.2 Respiratory Rate(breaths/min): 20 Photos: [N/A:N/A] Wound Location: Left Toe Great N/A N/A Wounding Event: Gradually Appeared N/A N/A Primary Etiology: Diabetic Wound/Ulcer of the Lower N/A N/A Extremity Comorbid History: Lymphedema, Arrhythmia, N/A N/A Hypertension, Type II  Diabetes, End Stage Renal Disease, Rheumatoid Arthritis, Osteoarthritis, Confinement Anxiety Date Acquired: 06/19/2019 N/A N/A Weeks of Treatment: 14 N/A N/A Wound Status: Open N/A N/A Measurements L x W x D (cm) 1.5x1x0.2 N/A N/A Area (cm) : 1.178 N/A N/A Volume (cm) : 0.236 N/A N/A % Reduction in Area: 56.50% N/A N/A % Reduction in Volume: 12.90% N/A N/A Classification: Grade 2 N/A N/A Exudate Amount: Medium N/A N/A Exudate Type: Serous N/A N/A Exudate Color: amber N/A N/A Wound Margin: Flat and Intact N/A N/A Granulation Amount:  Small (1-33%) N/A N/A Granulation Quality: Pink N/A N/A Necrotic Amount: Large (67-100%) N/A N/A Exposed Structures: Fat Layer (Subcutaneous Tissue) N/A N/A Exposed: Yes Fascia: No Tendon: No Muscle: No Joint: No Bone: No Epithelialization: Small (1-33%) N/A N/A Treatment Notes Electronic Signature(s) Signed: 10/23/2019 3:28:23 PM By: Army Melia Entered By: Army Melia on 10/23/2019 10:31:57 Timothy Gibson, Timothy Gibson ([id number]) Timothy Gibson, Timothy Gibson ([id number]) -------------------------------------------------------------------------------- [place] Details Patient Name: Timothy Gibson Date of Service: 10/23/2019 10:15 AM Medical Record Number: [id number] Patient Account Number: [id number] Date of Birth/Sex: Sep 19, 1950 (69 y.o. M) Treating RN: Army Melia Primary Care Derius Ghosh: Merrie Roof Other Clinician: Referring Myka Hitz: Merrie Roof Treating Zaron Zwiefelhofer/Extender: Melburn Hake, [name] Weeks in Treatment: 14 Active Inactive Abuse / Safety / Falls / Self Care Management Nursing Diagnoses: Potential for falls Goals: Patient will not experience any injury related to falls Date Initiated: 07/15/2019 Target Resolution Date: 10/18/2019 Goal Status: Active Interventions: Assess fall risk on admission and as needed Notes: Necrotic Tissue Nursing Diagnoses: Impaired tissue integrity related to necrotic/devitalized  tissue Goals: Necrotic/devitalized tissue will be minimized in the wound bed Date Initiated: 07/15/2019 Target Resolution Date: 10/18/2019 Goal Status: Active Interventions: Provide education on necrotic tissue and debridement process Notes: Orientation to the Wound Care Program Nursing Diagnoses: Knowledge deficit related to the wound healing center program Goals: Patient/caregiver will verbalize understanding of the [place] Program Date Initiated: 07/15/2019 Target Resolution Date: 10/18/2019 Goal Status: Active Interventions: Provide education on orientation to the wound center Notes: Peripheral Neuropathy Nursing Diagnoses: Knowledge deficit related to disease process and management of peripheral neurovascular dysfunction Goals: Patient/caregiver will verbalize understanding of disease process and disease management Date Initiated: 07/15/2019 Target Resolution Date: 10/18/2019 Goal Status: Active Timothy Gibson, Timothy Gibson ([id number]) Interventions: Assess signs and symptoms of neuropathy upon admission and as needed Provide education on Management of Neuropathy and Related Ulcers Notes: Wound/Skin Impairment Nursing Diagnoses: Impaired tissue integrity Goals: Ulcer/skin breakdown will heal within 14 weeks Date Initiated: 07/15/2019 Target Resolution Date: 10/18/2019 Goal Status: Active Interventions: Assess patient/caregiver ability to obtain necessary supplies Assess patient/caregiver ability to perform ulcer/skin care regimen upon admission and as needed Assess ulceration(s) every visit Notes: Electronic Signature(s) Signed: 10/23/2019 3:28:23 PM By: Army Melia Entered By: Army Melia on 10/23/2019 10:31:47 Timothy Gibson ([id number]) -------------------------------------------------------------------------------- Pain Assessment Details Patient Name: Timothy Gibson Date of Service: 10/23/2019 10:15 AM Medical Record Number: [id number] Patient Account  Number: [id number] Date of Birth/Sex: 10-Oct-1950 (69 y.o. M) Treating RN: Cornell Barman Primary Care Alaiza Yau: Merrie Roof Other Clinician: Referring Oletta Buehring: Merrie Roof Treating Luqman Perrelli/Extender: Melburn Hake, [name] Weeks in Treatment: 14 Active Problems Location of Pain Severity and Description of Pain Patient Has Paino No Site Locations Pain Management and Medication Current Pain Management: Notes pt denies pain Electronic Signature(s) Signed: 12/19/2019 12:59:55 PM By: Gretta Cool, BSN, RN, CWS, Kim RN, BSN Entered By: Gretta Cool, BSN, RN, CWS, Kim on 10/23/2019 10:02:51 Timothy Gibson ([id number]) -------------------------------------------------------------------------------- Patient/Caregiver Education Details Patient Name: Timothy Gibson Date of Service: 10/23/2019 10:15 AM Medical Record Number: [id number] Patient Account Number: [id number] Date of Birth/Gender: October 05, 1950 (68 y.o. M) Treating RN: Army Melia Primary Care Physician: Merrie Roof Other Clinician: Referring Physician: Merrie Roof Treating Physician/Extender: Sharalyn Ink in Treatment: 14 Education Assessment Education Provided To: Patient Education Topics Provided Wound/Skin Impairment: Handouts: Caring for Your Ulcer Methods: Demonstration, Explain/Verbal Responses: State content correctly Electronic Signature(s) Signed: 10/23/2019 3:28:23 PM By: Army Melia Entered By: Army Melia on 10/23/2019 10:36:10 Timothy Gibson ([id number]) -------------------------------------------------------------------------------- Wound  Assessment Details Patient Name: Timothy Gibson, Timothy Gibson Date of Service: 10/23/2019 10:15 AM Medical Record Number: [id number] Patient Account Number: [id number] Date of Birth/Sex: [birth year] (69 y.o. M) Treating RN: Cornell Barman Primary Care Mycala Warshawsky: Merrie Roof Other Clinician: Referring Darice Vicario: Merrie Roof Treating Anoushka Divito/Extender: Melburn Hake, [name] Weeks in Treatment: 14 Wound  Status Wound Number: 3 Primary Diabetic Wound/Ulcer of the Lower Extremity Etiology: Wound Location: Left Toe Great Wound Open Wounding Event: Gradually Appeared Status: Date Acquired: 06/19/2019 Comorbid Lymphedema, Arrhythmia, Hypertension, Type II Weeks Of Treatment: 14 History: Diabetes, End Stage Renal Disease, Rheumatoid Arthritis, Clustered Wound: No Osteoarthritis, Confinement Anxiety Photos Wound Measurements Length: (cm) 1.5 Width: (cm) 1 Depth: (cm) 0.2 Area: (cm) 1.178 Volume: (cm) 0.236 % Reduction in Area: 56.5% % Reduction in Volume: 12.9% Epithelialization: Small (1-33%) Tunneling: No Undermining: No Wound Description Classification: Grade 2 Wound Margin: Flat and Intact Exudate Amount: Medium Exudate Type: Serous Exudate Color: amber Foul Odor After Cleansing: No Slough/Fibrino Yes Wound Bed Granulation Amount: Small (1-33%) Exposed Structure Granulation Quality: Pink Fascia Exposed: No Necrotic Amount: Large (67-100%) Fat Layer (Subcutaneous Tissue) Exposed: Yes Necrotic Quality: Adherent Slough Tendon Exposed: No Muscle Exposed: No Joint Exposed: No Bone Exposed: No Electronic Signature(s) Signed: 12/19/2019 12:59:55 PM By: Gretta Cool, BSN, RN, CWS, Kim RN, BSN Entered By: Gretta Cool, BSN, RN, CWS, Kim on 10/23/2019 10:07:43 Timothy Gibson ([id number]) -------------------------------------------------------------------------------- [place] Details Patient Name: Timothy Gibson Date of Service: 10/23/2019 10:15 AM Medical Record Number: [id number] Patient Account Number: [id number] Date of Birth/Sex: 10-Dec-1950 (69 y.o. M) Treating RN: Cornell Barman Primary Care Kaisley Stiverson: Merrie Roof Other Clinician: Referring Emin Foree: Merrie Roof Treating Raylinn Kosar/Extender: Melburn Hake, [name] Weeks in Treatment: 14 Vital Signs Time Taken: 10:00 Temperature (F): 98.2 Height (in): 76 Pulse (bpm): 86 Weight (lbs): 244 Respiratory Rate (breaths/min): 20 Body  Mass Index (BMI): 29.7 Blood Pressure (mmHg): 230/80 Reference Range: 80 - 120 mg / dl Electronic Signature(s) Signed: 12/19/2019 12:59:55 PM By: Gretta Cool, BSN, RN, CWS, Kim RN, BSN Entered By: Gretta Cool, BSN, RN, CWS, Kim on 10/23/2019 10:02:26

## 2019-12-22 DIAGNOSIS — Z992 Dependence on renal dialysis: Secondary | ICD-10-CM | POA: Diagnosis not present

## 2019-12-25 ENCOUNTER — Other Ambulatory Visit: Payer: Self-pay

## 2019-12-25 ENCOUNTER — Encounter: Payer: Medicare Other | Admitting: Physician Assistant

## 2019-12-25 DIAGNOSIS — E11621 Type 2 diabetes mellitus with foot ulcer: Secondary | ICD-10-CM | POA: Diagnosis not present

## 2019-12-25 DIAGNOSIS — L97522 Non-pressure chronic ulcer of other part of left foot with fat layer exposed: Secondary | ICD-10-CM | POA: Diagnosis not present

## 2019-12-25 DIAGNOSIS — M86672 Other chronic osteomyelitis, left ankle and foot: Secondary | ICD-10-CM | POA: Diagnosis not present

## 2019-12-25 NOTE — Progress Notes (Signed)
Timothy Gibson ([id number]) Visit Report for 12/25/2019 Chief Complaint Document Details Patient Name: Timothy Gibson, Timothy Gibson Date of Service: 12/25/2019 8:45 AM Medical Record Number: [id number] Patient Account Number: [id number] Date of Birth/Sex: 03/31/1951 (69 y.o. M) Treating RN: Army Melia Primary Care Provider: Merrie Roof Other Clinician: Referring Provider: Merrie Roof Treating Provider/Extender: Melburn Hake, Gali Spinney Weeks in Treatment: 23 Information Obtained from: Patient Chief Complaint Left Great Toe Electronic Signature(s) Signed: 12/25/2019 4:21:54 PM By: Worthy Keeler PA-C Entered By: Worthy Keeler on 12/25/2019 08:23:07 Timothy Gibson ([id number]) -------------------------------------------------------------------------------- Debridement Details Patient Name: Timothy Gibson Date of Service: 12/25/2019 8:45 AM Medical Record Number: [id number] Patient Account Number: [id number] Date of Birth/Sex: May 17, 1951 (69 y.o. M) Treating RN: Army Melia Primary Care Provider: Merrie Roof Other Clinician: Referring Provider: Merrie Roof Treating Provider/Extender: Melburn Hake, Piero Mustard Weeks in Treatment: 23 Debridement Performed for Wound #3 Left Toe Great Assessment: Performed By: Physician [name], Jazzalyn Loewenstein E., PA-C Debridement Type: Debridement Severity of Tissue Pre Debridement: Fat layer exposed Level of Consciousness (Pre- Awake and Alert procedure): Pre-procedure Verification/Time Out Yes - 08:53 Taken: Start Time: 08:54 Pain Control: Lidocaine Total Area Debrided (L x W): 1.2 (cm) x 0.5 (cm) = 0.6 (cm) Tissue and other material Viable, Non-Viable, Callus, Subcutaneous debrided: Level: Skin/Subcutaneous Tissue Debridement Description: Excisional Instrument: Curette Bleeding: None End Time: 08:55 Response to Treatment: Procedure was tolerated well Level of Consciousness (Post- Awake and Alert procedure): Post Debridement Measurements of Total  Wound Length: (cm) 1.2 Width: (cm) 0.5 Depth: (cm) 0.3 Volume: (cm) 0.141 Character of Wound/Ulcer Post Debridement: Stable Severity of Tissue Post Debridement: Fat layer exposed Post Procedure Diagnosis Same as Pre-procedure Electronic Signature(s) Signed: 12/25/2019 1:58:43 PM By: Army Melia Signed: 12/25/2019 4:21:54 PM By: Worthy Keeler PA-C Entered By: Army Melia on 12/25/2019 08:55:22 Timothy Gibson ([id number]) -------------------------------------------------------------------------------- HPI Details Patient Name: Timothy Gibson Date of Service: 12/25/2019 8:45 AM Medical Record Number: [id number] Patient Account Number: [id number] Date of Birth/Sex: 12-24-1950 (69 y.o. M) Treating RN: Army Melia Primary Care Provider: Merrie Roof Other Clinician: Referring Provider: Merrie Roof Treating Provider/Extender: Melburn Hake, Jrue Jarriel Weeks in Treatment: 23 History of Present Illness HPI Description: 11/27/17 on evaluation today patient presents for initial evaluation concerning an issue that he has been having with his right great toe which began last Thursday 11/22/17. He has a history of diabetes mellitus type to which he has had for greater than 30 years currently he is on insulin. Subsequently he also has a history of hypertension. On physical exam inspection is also appears he may have some peripheral vascular disease with his ABI's being noncompressible bilaterally. He is on dialysis as well and this is on Monday, Wednesday, and Friday. Patient was hospitalized back in January/February 2019 although this was more for stomach/back pain though they never told me exactly what was going on. This is according to the patient. Subsequently the ulcer which is between the third and fourth toe when space of the right foot as well is on the plantar aspect of the fourth toe is due to him having cleaned his toes this morning and he states that his finger which is large split the toe  tissue in between causing the wounds that we currently see. With that being said he states this has happened before I explained I would definitely recommend that he not do this any longer. He can use a small soft washcloth to clean between the toes without causing trauma. Subsequently the right great toe  hatchery does show evidence of necrotic tissue present on the surface of the wound specifically there is callous and dead skin surrounding which is trapping fluid causing problems as far as the wound is concerned. The surface of the wound does show slough although due to his vascular flow I'm not going to sharply debride this today I think I will selectively debride away the necrotic skin as well is callous from around the surrounding so this will not continue to be a moisture issue. Nonetheless the patient has no pain he does have diabetic neuropathy. 12/06/17-He is here in follow-up evaluation for a right great toe ulcer. He is voicing no complaints or concerns. He continues to infrequently/socially smokes cigars with no desire to quit. He has been compliant with offloading, using open toed surgical shoe; has been compliant using Santyl daily. He continues on clindamycin although takes it inconsistently secondary to indigestion. The plain film x-ray performed on 5/23 impression: Soft tissue swelling of the left first toe and is noted with probable irregular lucency involving distal tuft of first distal phalanx concerning for osteomyelitis. MRI may be performed for further evaluation; MRI ordered. The vascular evaluation performed on 5/24 field non- compressible ABIs bilaterally and reduced TBI bilaterally suggesting significant tibial disease. He will be referred to vascular medicine for further evaluation. 12/13/17-He is here in follow-up evaluation for right great toe ulcer. He has appointment next Thursday for the MRI and vascular evaluation. Wound culture obtained last week grew abundant Klebsiella  oxytoca (multidrug sensitivity), and abundant enteric coccus faecalis (sensitive to ampicillin). Ampicillin and Cipro were called in, along with a probiotic. In light of his appointments next Thursday he will follow-up in 2 weeks, we will continue with Santyl 12/27/17-He is here for evaluation for a right great toe ulcer. 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 He did have evaluation by vascular medicine, Dr. Ronalee Belts, on 6/13, at that appointment it was decided for him to undergo angiography with possible intervention but he refused and is still considering. If he chooses to have it done he will contact the office. He has not heard back from either ID offices that he was referred to two weeks ago: [place] and [place]. there is improvement in both appearance and measurement to the ulcer. We will extend antibiotic therapy (amoxicillin 500 every 12, Cipro 500 daily) for additional 2 weeks pending infectious disease consult, he will continue with Santyl daily. He was reminded to continue with probiotic therapy while on oral antibiotics; he denies any GI disturbance. 01/03/18-He is here in follow-up evaluation for right great toe ulcer. He is decided to not undergo any vascular intervention at this time. He was established with [facility] infectious disease (Dr. Linus Salmons) last week initiated on vancomycin and cefazolin with dialysis treatment for 6 weeks. We will continue with current treatment plan with Santyl and offloading and he will follow-up in 2 weeks 01/17/18-He is seen in follow-up evaluation for right great toe ulcer. There is improvement in appearance with less nonviable tissue present. We will continue with same treatment plan he will follow-up next week. He is tolerating IV antibiotics without any complications 01/04/30-DV is seen in follow up evaluation for right great toe ulcer. There continues to be improvement. He  is tolerating IV antibiotics with hemodialysis, completion date of 7/31. We will transition to collagen and and follow-up next week 02/07/18-He is here in follow up for a right great toe ulcer. There  is red granulation tissue throughout the wound, improved in appearance. He completed antibiotics yesterday. He saw podiatry last Thursday for nail trimming, at that appointment he periwound callus was trimmed and a culture was obtained; culture negative. He does not have a follow up appointment with infectious disease. We will submit for grafix. 02/14/18-He is here in follow-up evaluation for a right great toe ulcer. There is slow improvement, red granulation tissue throughout. The insurance approval for grafix is pending. We will continue with collagen and he'll follow-up next week 02/21/18-He is seen in follow-up evaluation for right great toe ulcer. There is improvement with red granulation tissue throughout. He was approved for oasis and this was placed today. He will follow up next week 02/28/18-He is seen in follow-up evaluation for right great toe ulcer. The wound is stable, oasis applied and he'll follow-up next week 03/07/18-He is seen in follow-up violation of her right great toe ulcer. The wound is dry, no evidence of drainage. The wound appears healed today. He was painted with Betadine, instructed to paint with Betadine daily, cover with band-aid for the next week and then cover with band-aid for the following week continue with surgical shoe. He was encouraged to contact the clinic with any evidence of drainage, or change in appearance to toe/wound. He will be discharged from wound care services Readmission: 07/14/18 on evaluation today patient presents for initial evaluation our clinic concerning issues that he is having with his left great toe. We previously saw him in 2019 for his right great toe which was actually worse at that time and subsequently was able to be completely healed in the end  although it did take several months. With that being said the patient is currently having an area on his left great toe that occurred initially as result of a blister that came up he's not really sure why or how. Subsequently this was initially treated by his podiatrist though the recommendation there was apparently amputation according to the patient and his daughter who was present with him today. With that being said the patient did not want to proceed with amputation and would like to try to perform wound care and get this to heal without having to go down that road. We were able to do that with his other toe and so he would like to at least give that a try before going forward with amputation. Again I completely understand his concern in this regard. With that being said I did discuss with the patient obviously that it's always a chance that we cannot get this to heal with normal wound care measures but obviously we will give it our best shot. He does actually have an appointment scheduled for later today with the vascular specialist in order to evaluate his blood flow. That was at the recommendation of his podiatrist as well. Obviously if he is having good arterial flow and nonetheless is still experiencing issues with having the wound delay in healing then it may simply be a matter of we need to initiate more aggressive wound to therapy in order to see if we can get this to heal. No fevers, chills, CHIBUIKEM, THANG. ([id number]) nausea, or vomiting noted at this time. The patient notes that this woman has been present for approximately three weeks. He is not a smoker. His most recent hemoglobin A1c was 6.2 on 06/18/19. 07/17/2019 on evaluation today patient appears unfortunately to not be doing as well. He did see vascular I did review the note  as well. That was on 07/15/2019. Subsequently there can actually be taking him to the OR for an angiogram due to poor arterial flow into this extremity.  Apparently the atherosclerotic changes were severe. The patient is stated to be at risk for limb loss. Nonetheless the good news is this is being scheduled fairly quickly he will have the surgery/procedure next Thursday. With that being said I am going also go ahead based on the results of his x-ray which showed some chance of osteomyelitis in the distal portion of the toe get the patient set up for an MRI to further evaluate for the possibility of osteomyelitis obviously if he does have osteomyelitis we need to know so that we can address this appropriately. 07/29/2019 upon evaluation today patient appears to be doing better in general with regard to his toe ulcer. He does have much better blood flow you can actually feel a palpable pulse which appears to be very strong at this time. I did review his arterial intervention/angiogram which did show that he had evidence of sufficient arterial flow restoration at this point. He did have occlusions that were 60 and 80% that residually were only 10 and 25% with no limiting flow. This is excellent news and hopefully he will continue to show signs of improvement in light of the improved vascular status. With that being said the toe mainly shows somewhat of an eschar today I think that this is fairly stable and as such probably would be best not to really do much in the way of debridement at this point I do believe Betadine would likely be a good option. 08/07/2019 on evaluation today patient actually appears to be doing about the same. The one difference is the eschar that we were just seeing if we can maintain is actually starting to loosen up and leak around the edges I think it is time to actually go ahead and remove this today. Is also no longer stable is very soft. Nonetheless the patient is okay with proceeding with debridement at this point. Fortunately there is no signs of active infection. No fevers, chills, nausea, vomiting, or diarrhea. The patient  states that he took his last antibiotic that he had last night. He would need a refill as he will not be seeing infectious disease until February 2. Nonetheless obviously I do not want him to have any break in therapy especially when he is doing as well as he is currently. 08/14/2019 on evaluation today patient appears to be doing quite well all things considered in regard to his toe. I do feel like he is making progress and though this is not completely healed by any means we still have some ways to go I feel like he is making progress which is excellent. There is no sign of active infection at this time systemically. He does have osteomyelitis of the distal phalanx of his great toe on the left. He did see Dr. Linus Salmons. That is the infectious disease doctor in [place] where he was referred. Dr. Linus Salmons apparently according to the note did not feel like the patient's MRI revealed osteomyelitis but just reactive marrow and subsequently did not recommend any antibiotic therapy whatsoever. To be peripherally honest I am not really in agreement with this based on the MRI results and what we are seeing and I think he has been responding at least decently well to oral medication as well. Nonetheless we may want to check into the possibility of IV antibiotics being administered at  the dialysis center. I will discuss this with Dr. Dellia Nims further. 08/21/2019 upon evaluation today patient appears at this point to be making some progress with regard to his wounds. He has been tolerating the dressing changes without complication. Fortunately there is no evidence of active infection at this time. Overall I feel like he is doing well with the oral antibiotics I discussed with Dr. Dellia Nims the possibility of doing IV antibiotic therapy versus continuing with the oral. His opinion was if the patient is doing well with oral to maybe stick with that. Obviously we can initiate additional Cipro as well just to help ensure that  there is nothing worsening here. The Cipro should be beneficial for him as well. That helps cover more gram-negative's were is at the doxycycline is more gram- positive's. 09/02/2019 on evaluation today patient appears to be doing fairly well upon inspection today. With that being said though the patient is continuing to do well he does also continue to develop issues with buildup of necrotic tissue on the distal portion of his toe he does have good arterial flow however. For that reason I am going to suggest that what we may need to do is consider using something such as Santyl to try to keep this free and clear. Also discussed in greater detail HBO therapy with him today please see plan for additional recommendations and results of this discussion. 09/11/2019 upon evaluation today patient appears to be doing well with regard to his toe ulcer. This actually appears to be much better today using the Santyl than it was previous. Fortunately there is no signs of active infection. No fevers, chills, nausea, vomiting, or diarrhea. 09/18/2019 upon evaluation today patient appears to be doing much better in regard to his wound in general. The Santyl was doing a good job at loosening stuff up here for [place] which is excellent news. With that being said he is still taking the oral antibiotic. I sent this in last for him on 09/03/2019. This was the doxycycline. When this runs out he will actually be complete as far as the treatment is concerned. With that being said I am getting continue the Cipro for 1 additional month. Overall his wound seems to be doing quite well. 09/25/2019 upon evaluation today patient appears to be doing well with regard to his toe ulcer. This is measuring much better and overall seems to be making good progress. Fortunately there is no signs of active infection at this time. No fevers, chills, nausea, vomiting, or diarrhea. 10/02/2019 upon evaluation today patient appears to be doing well with  regard to his wound. He has been tolerating the dressing changes without complication. Fortunately there is no signs of active infection at this time. No fevers, chills, nausea, vomiting, or diarrhea. Overall I am extremely pleased with how things seem to be progressing. I think the wound bed is at the point now where we can likely proceed with additional measures to try to get this healed more quickly I think Dermagraft may be a good possibility for the patient. We can definitely look into seeing about approval for this. 10/09/2019 upon evaluation today patient's wound actually appears to be doing well although it still is progressing somewhat slowly compared to what I would like to see. With that being said I did order Apligraf for the patient which I felt would keep the wound nice and moist without allowing it to dry out and hopefully allow this to heal much more effectively and quickly. The  patient actually did obtain approval and therefore we can apply that today. 10/23/2019 upon evaluation today patient's wound actually appears to be doing significantly better with regard to his toe ulcer. The tissue at the base of the wound is greatly improved and overall I am very pleased with the progress made. We did use Apligraf 2 weeks ago on the toe that did excellent for him much it has completely dissolved after as of the 2 weeks. They only had to change the dressing a few times due to drainage through. Obviously they left the Steri-Strips and Mepitel in place. Unfortunately we do not have the Apligraf available today for application regard therefore have to use a different product here in the clinic today and then order the Apligraf for next Thursday. That will be his second application. 4/22; patient arrived with overhanging skin and subcutaneous tissue on the wound margins this was removed. Apligraf #2 applied 11/13/2019 upon evaluation today patient actually appears to be doing well with regard to his  foot ulcer. He has been tolerating the dressing changes without complication. The overall wound bed appears to be healthier and has filled in as well as not nearly as deep as it was the tissue is also better quality. Overall I am extremely pleased with how things seem to be progressing. The patient is likewise very happy in this regard. 5/20; wound is measuring smaller had Apligraf #3 last time. Apligraf #4 today Timothy Gibson, Timothy Gibson ([id number]) 12/12/2019 upon evaluation today patient actually appears to be doing quite well with regard to his wound in my opinion. The ulcer is significantly smaller compared to last time I saw him although that has been several weeks. I do believe the Apligraf has been beneficial he has 1 remaining today this will be application #5. 5/36/1443 upon evaluation today patient appears to be doing well with regard to his toe ulcer. He is measuring about the same today but he has come a long way from where this started. Fortunately there is no evidence of active infection which is great news and overall I am very pleased with the progress that is been made. Nonetheless at this point we have completed the Apligraf course he did see improvement through this but I am going to switch to something different as of today. Electronic Signature(s) Signed: 12/25/2019 9:20:42 AM By: Worthy Keeler PA-C Entered By: Worthy Keeler on 12/25/2019 09:20:41 Timothy Gibson, Timothy Gibson ([id number]) -------------------------------------------------------------------------------- Physical Exam Details Patient Name: Timothy Gibson Date of Service: 12/25/2019 8:45 AM Medical Record Number: [id number] Patient Account Number: [id number] Date of Birth/Sex: [birth year] (69 y.o. M) Treating RN: Army Melia Primary Care Provider: Merrie Roof Other Clinician: Referring Provider: Merrie Roof Treating Provider/Extender: [name], Tylee Yum Weeks in Treatment: 49 Constitutional Well-nourished and  well-hydrated in no acute distress. Respiratory normal breathing without difficulty. Psychiatric this patient is able to make decisions and demonstrates good insight into disease process. Alert and Oriented x 3. pleasant and cooperative. Notes Patient's wound bed did require sharp debridement to clear away some of the necrotic debris and he tolerated that today without complication. Post debridement wound bed appears to be doing much better which is excellent news I am very pleased in that regard as well. Electronic Signature(s) Signed: 12/25/2019 9:20:59 AM By: Worthy Keeler PA-C Entered By: Worthy Keeler on 12/25/2019 09:20:59 Timothy Gibson, Timothy Gibson ([id number]) -------------------------------------------------------------------------------- Physician Orders Details Patient Name: Timothy Gibson Date of Service: 12/25/2019 8:45 AM Medical Record Number: [id number] Patient Account Number: [id number]  Date of Birth/Sex: 21-Oct-1950 (69 y.o. M) Treating RN: Army Melia Primary Care Provider: Merrie Roof Other Clinician: Referring Provider: Merrie Roof Treating Provider/Extender: Melburn Hake, Maijor Hornig Weeks in Treatment: 60 Verbal / Phone Orders: No Diagnosis Coding ICD-10 Coding Code Description [phone] Other chronic osteomyelitis, left ankle and foot E11.621 Type 2 diabetes mellitus with foot ulcer L97.522 Non-pressure chronic ulcer of other part of left foot with fat layer exposed I10 Essential (primary) hypertension Wound Cleansing Wound #3 Left Toe Great o Dial antibacterial soap, wash wounds, rinse and pat dry prior to dressing wounds o May Shower, gently pat wound dry prior to applying new dressing. Anesthetic (add to Medication List) Wound #3 Left Toe Great o Topical Lidocaine 4% cream applied to wound bed prior to debridement (In Clinic Only). Primary Wound Dressing Wound #3 Left Toe Great o Hydrafera Blue Ready Transfer Secondary Dressing Wound #3 Left Toe Great o  Conform/Kerlix Dressing Change Frequency Wound #3 Left Toe Great o Other: - As needed Follow-up Appointments Wound #3 Left Toe Great o Return Appointment in 2 weeks. Electronic Signature(s) Signed: 12/25/2019 1:58:43 PM By: Army Melia Signed: 12/25/2019 4:21:54 PM By: Worthy Keeler PA-C Entered By: Army Melia on 12/25/2019 08:57:16 Timothy Gibson, Timothy Gibson ([id number]) -------------------------------------------------------------------------------- Problem List Details Patient Name: Timothy Gibson Date of Service: 12/25/2019 8:45 AM Medical Record Number: [id number] Patient Account Number: [id number] Date of Birth/Sex: [birth year] (69 y.o. M) Treating RN: Army [facility] Primary Care Provider: Merrie Roof Other Clinician: Referring Provider: Merrie Roof Treating Provider/Extender: Melburn Hake, Ashanti Ratti Weeks in Treatment: 23 Active Problems ICD-10 Encounter Code Description Active Date MDM Diagnosis [phone] Other chronic osteomyelitis, left ankle and foot 07/29/2019 No Yes E11.621 Type 2 diabetes mellitus with foot ulcer 07/15/2019 No Yes L97.522 Non-pressure chronic ulcer of other part of left foot with fat layer 07/15/2019 No Yes exposed [place] (primary) hypertension 07/15/2019 No Yes Inactive Problems Resolved Problems Electronic Signature(s) Signed: 12/25/2019 4:21:54 PM By: Worthy Keeler PA-C Entered By: Worthy Keeler on 12/25/2019 08:22:49 Timothy Gibson ([id number]) -------------------------------------------------------------------------------- Progress Note Details Patient Name: Timothy Gibson Date of Service: 12/25/2019 8:45 AM Medical Record Number: [id number] Patient Account Number: [id number] Date of Birth/Sex: May 11, 1951 (69 y.o. M) Treating RN: Army Melia Primary Care Provider: Merrie Roof Other Clinician: Referring Provider: Merrie Roof Treating Provider/Extender: Melburn Hake, Nehemyah Foushee Weeks in Treatment: 23 Subjective Chief Complaint Information  obtained from Patient Left Great Toe History of Present Illness (HPI) 11/27/17 on evaluation today patient presents for initial evaluation concerning an issue that he has been having with his right great toe which began last Thursday 11/22/17. He has a history of diabetes mellitus type to which he has had for greater than 30 years currently he is on insulin. Subsequently he also has a history of hypertension. On physical exam inspection is also appears he may have some peripheral vascular disease with his ABI's being noncompressible bilaterally. He is on dialysis as well and this is on Monday, Wednesday, and Friday. Patient was hospitalized back in January/February 2019 although this was more for stomach/back pain though they never told me exactly what was going on. This is according to the patient. Subsequently the ulcer which is between the third and fourth toe when space of the right foot as well is on the plantar aspect of the fourth toe is due to him having cleaned his toes this morning and he states that his finger which is large split the toe tissue in between causing the wounds that we currently  see. With that being said he states this has happened before I explained I would definitely recommend that he not do this any longer. He can use a small soft washcloth to clean between the toes without causing trauma. Subsequently the right great toe hatchery does show evidence of necrotic tissue present on the surface of the wound specifically there is callous and dead skin surrounding which is trapping fluid causing problems as far as the wound is concerned. The surface of the wound does show slough although due to his vascular flow I'm not going to sharply debride this today I think I will selectively debride away the necrotic skin as well is callous from around the surrounding so this will not continue to be a moisture issue. Nonetheless the patient has no pain he does have diabetic  neuropathy. 12/06/17-He is here in follow-up evaluation for a right great toe ulcer. He is voicing no complaints or concerns. He continues to infrequently/socially smokes cigars with no desire to quit. He has been compliant with offloading, using open toed surgical shoe; has been compliant using Santyl daily. He continues on clindamycin although takes it inconsistently secondary to indigestion. The plain film x-ray performed on 5/23 impression: Soft tissue swelling of the left first toe and is noted with probable irregular lucency involving distal tuft of first distal phalanx concerning for osteomyelitis. MRI may be performed for further evaluation; MRI ordered. The vascular evaluation performed on 5/24 field non- compressible ABIs bilaterally and reduced TBI bilaterally suggesting significant tibial disease. He will be referred to vascular medicine for further evaluation. 12/13/17-He is here in follow-up evaluation for right great toe ulcer. He has appointment next Thursday for the MRI and vascular evaluation. Wound culture obtained last week grew abundant Klebsiella oxytoca (multidrug sensitivity), and abundant enteric coccus faecalis (sensitive to ampicillin). Ampicillin and Cipro were called in, along with a probiotic. In light of his appointments next Thursday he will follow-up in 2 weeks, we will continue with Santyl 12/27/17-He is here for evaluation for a right great toe ulcer. 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 He did have evaluation by vascular medicine, Dr. Ronalee Belts, on 6/13, at that appointment it was decided for him to undergo angiography with possible intervention but he refused and is still considering. If he chooses to have it done he will contact the office. He has not heard back from either ID offices that he was referred to two weeks ago: [place] and [place]. there is improvement in both appearance  and measurement to the ulcer. We will extend antibiotic therapy (amoxicillin 500 every 12, Cipro 500 daily) for additional 2 weeks pending infectious disease consult, he will continue with Santyl daily. He was reminded to continue with probiotic therapy while on oral antibiotics; he denies any GI disturbance. 01/03/18-He is here in follow-up evaluation for right great toe ulcer. He is decided to not undergo any vascular intervention at this time. He was established with [facility] infectious disease (Dr. Linus Salmons) last week initiated on vancomycin and cefazolin with dialysis treatment for 6 weeks. We will continue with current treatment plan with Santyl and offloading and he will follow-up in 2 weeks 01/17/18-He is seen in follow-up evaluation for right great toe ulcer. There is improvement in appearance with less nonviable tissue present. We will continue with same treatment plan he will follow-up next week. He is tolerating IV antibiotics without any complications 5/85/27-PO is seen in follow up evaluation  for right great toe ulcer. There continues to be improvement. He is tolerating IV antibiotics with hemodialysis, completion date of 7/31. We will transition to collagen and and follow-up next week 02/07/18-He is here in follow up for a right great toe ulcer. There is red granulation tissue throughout the wound, improved in appearance. He completed antibiotics yesterday. He saw podiatry last Thursday for nail trimming, at that appointment he periwound callus was trimmed and a culture was obtained; culture negative. He does not have a follow up appointment with infectious disease. We will submit for grafix. 02/14/18-He is here in follow-up evaluation for a right great toe ulcer. There is slow improvement, red granulation tissue throughout. The insurance approval for grafix is pending. We will continue with collagen and he'll follow-up next week 02/21/18-He is seen in follow-up evaluation for right great toe  ulcer. There is improvement with red granulation tissue throughout. He was approved for oasis and this was placed today. He will follow up next week 02/28/18-He is seen in follow-up evaluation for right great toe ulcer. The wound is stable, oasis applied and he'll follow-up next week 03/07/18-He is seen in follow-up violation of her right great toe ulcer. The wound is dry, no evidence of drainage. The wound appears healed today. He was painted with Betadine, instructed to paint with Betadine daily, cover with band-aid for the next week and then cover with band-aid for the following week continue with surgical shoe. He was encouraged to contact the clinic with any evidence of drainage, or change in appearance to toe/wound. He will be discharged from wound care services Readmission: 07/14/18 on evaluation today patient presents for initial evaluation our clinic concerning issues that he is having with his left great toe. We previously saw him in 2019 for his right great toe which was actually worse at that time and subsequently was able to be completely healed in the end although it did take several months. With that being said the patient is currently having an area on his left great toe that occurred initially as result of a blister that came up he's not really sure why or how. Subsequently this was initially treated by his podiatrist though the recommendation there was apparently amputation according to the patient and his daughter who was present with him today. With that being said the patient did not want to proceed with amputation and would like to try to perform wound care and get this to heal without having to go down that road. We were able to do that with his other toe and so he would like to at least give that a try before going forward with amputation. Timothy Gibson, Timothy Gibson ([id number]) Again I completely understand his concern in this regard. With that being said I did discuss with the patient  obviously that it's always a chance that we cannot get this to heal with normal wound care measures but obviously we will give it our best shot. He does actually have an appointment scheduled for later today with the vascular specialist in order to evaluate his blood flow. That was at the recommendation of his podiatrist as well. Obviously if he is having good arterial flow and nonetheless is still experiencing issues with having the wound delay in healing then it may simply be a matter of we need to initiate more aggressive wound to therapy in order to see if we can get this to heal. No fevers, chills, nausea, or vomiting noted at this time. The patient notes that  this woman has been present for approximately three weeks. He is not a smoker. His most recent hemoglobin A1c was 6.2 on 06/18/19. 07/17/2019 on evaluation today patient appears unfortunately to not be doing as well. He did see vascular I did review the note as well. That was on 07/15/2019. Subsequently there can actually be taking him to the OR for an angiogram due to poor arterial flow into this extremity. Apparently the atherosclerotic changes were severe. The patient is stated to be at risk for limb loss. Nonetheless the good news is this is being scheduled fairly quickly he will have the surgery/procedure next Thursday. With that being said I am going also go ahead based on the results of his x-ray which showed some chance of osteomyelitis in the distal portion of the toe get the patient set up for an MRI to further evaluate for the possibility of osteomyelitis obviously if he does have osteomyelitis we need to know so that we can address this appropriately. 07/29/2019 upon evaluation today patient appears to be doing better in general with regard to his toe ulcer. He does have much better blood flow you can actually feel a palpable pulse which appears to be very strong at this time. I did review his arterial intervention/angiogram which  did show that he had evidence of sufficient arterial flow restoration at this point. He did have occlusions that were 60 and 80% that residually were only 10 and 25% with no limiting flow. This is excellent news and hopefully he will continue to show signs of improvement in light of the improved vascular status. With that being said the toe mainly shows somewhat of an eschar today I think that this is fairly stable and as such probably would be best not to really do much in the way of debridement at this point I do believe Betadine would likely be a good option. 08/07/2019 on evaluation today patient actually appears to be doing about the same. The one difference is the eschar that we were just seeing if we can maintain is actually starting to loosen up and leak around the edges I think it is time to actually go ahead and remove this today. Is also no longer stable is very soft. Nonetheless the patient is okay with proceeding with debridement at this point. Fortunately there is no signs of active infection. No fevers, chills, nausea, vomiting, or diarrhea. The patient states that he took his last antibiotic that he had last night. He would need a refill as he will not be seeing infectious disease until February 2. Nonetheless obviously I do not want him to have any break in therapy especially when he is doing as well as he is currently. 08/14/2019 on evaluation today patient appears to be doing quite well all things considered in regard to his toe. I do feel like he is making progress and though this is not completely healed by any means we still have some ways to go I feel like he is making progress which is excellent. There is no sign of active infection at this time systemically. He does have osteomyelitis of the distal phalanx of his great toe on the left. He did see Dr. Linus Salmons. That is the infectious disease doctor in [place] where he was referred. Dr. Linus Salmons apparently according to the note did not  feel like the patient's MRI revealed osteomyelitis but just reactive marrow and subsequently did not recommend any antibiotic therapy whatsoever. To be peripherally honest I am not really  in agreement with this based on the MRI results and what we are seeing and I think he has been responding at least decently well to oral medication as well. Nonetheless we may want to check into the possibility of IV antibiotics being administered at the dialysis center. I will discuss this with Dr. Dellia Nims further. 08/21/2019 upon evaluation today patient appears at this point to be making some progress with regard to his wounds. He has been tolerating the dressing changes without complication. Fortunately there is no evidence of active infection at this time. Overall I feel like he is doing well with the oral antibiotics I discussed with Dr. Dellia Nims the possibility of doing IV antibiotic therapy versus continuing with the oral. His opinion was if the patient is doing well with oral to maybe stick with that. Obviously we can initiate additional Cipro as well just to help ensure that there is nothing worsening here. The Cipro should be beneficial for him as well. That helps cover more gram-negative's were is at the doxycycline is more gram- positive's. 09/02/2019 on evaluation today patient appears to be doing fairly well upon inspection today. With that being said though the patient is continuing to do well he does also continue to develop issues with buildup of necrotic tissue on the distal portion of his toe he does have good arterial flow however. For that reason I am going to suggest that what we may need to do is consider using something such as Santyl to try to keep this free and clear. Also discussed in greater detail HBO therapy with him today please see plan for additional recommendations and results of this discussion. 09/11/2019 upon evaluation today patient appears to be doing well with regard to his toe  ulcer. This actually appears to be much better today using the Santyl than it was previous. Fortunately there is no signs of active infection. No fevers, chills, nausea, vomiting, or diarrhea. 09/18/2019 upon evaluation today patient appears to be doing much better in regard to his wound in general. The Santyl was doing a good job at loosening stuff up here for [place] which is excellent news. With that being said he is still taking the oral antibiotic. I sent this in last for him on 09/03/2019. This was the doxycycline. When this runs out he will actually be complete as far as the treatment is concerned. With that being said I am getting continue the Cipro for 1 additional month. Overall his wound seems to be doing quite well. 09/25/2019 upon evaluation today patient appears to be doing well with regard to his toe ulcer. This is measuring much better and overall seems to be making good progress. Fortunately there is no signs of active infection at this time. No fevers, chills, nausea, vomiting, or diarrhea. 10/02/2019 upon evaluation today patient appears to be doing well with regard to his wound. He has been tolerating the dressing changes without complication. Fortunately there is no signs of active infection at this time. No fevers, chills, nausea, vomiting, or diarrhea. Overall I am extremely pleased with how things seem to be progressing. I think the wound bed is at the point now where we can likely proceed with additional measures to try to get this healed more quickly I think Dermagraft may be a good possibility for the patient. We can definitely look into seeing about approval for this. 10/09/2019 upon evaluation today patient's wound actually appears to be doing well although it still is progressing somewhat slowly compared to what  I would like to see. With that being said I did order Apligraf for the patient which I felt would keep the wound nice and moist without allowing it to dry out and hopefully  allow this to heal much more effectively and quickly. The patient actually did obtain approval and therefore we can apply that today. 10/23/2019 upon evaluation today patient's wound actually appears to be doing significantly better with regard to his toe ulcer. The tissue at the base of the wound is greatly improved and overall I am very pleased with the progress made. We did use Apligraf 2 weeks ago on the toe that did excellent for him much it has completely dissolved after as of the 2 weeks. They only had to change the dressing a few times due to drainage through. Obviously they left the Steri-Strips and Mepitel in place. Unfortunately we do not have the Apligraf available today for application regard therefore have to use a different product here in the clinic today and then order the Apligraf for next Thursday. That will be his second application. 4/22; patient arrived with overhanging skin and subcutaneous tissue on the wound margins this was removed. Apligraf #2 applied Timothy Gibson, Timothy Gibson ([id number]) 11/13/2019 upon evaluation today patient actually appears to be doing well with regard to his foot ulcer. He has been tolerating the dressing changes without complication. The overall wound bed appears to be healthier and has filled in as well as not nearly as deep as it was the tissue is also better quality. Overall I am extremely pleased with how things seem to be progressing. The patient is likewise very happy in this regard. 5/20; wound is measuring smaller had Apligraf #3 last time. Apligraf #4 today 12/12/2019 upon evaluation today patient actually appears to be doing quite well with regard to his wound in my opinion. The ulcer is significantly smaller compared to last time I saw him although that has been several weeks. I do believe the Apligraf has been beneficial he has 1 remaining today this will be application #5. 6/43/3295 upon evaluation today patient appears to be doing well with regard  to his toe ulcer. He is measuring about the same today but he has come a long way from where this started. Fortunately there is no evidence of active infection which is great news and overall I am very pleased with the progress that is been made. Nonetheless at this point we have completed the Apligraf course he did see improvement through this but I am going to switch to something different as of today. Objective Constitutional Well-nourished and well-hydrated in no acute distress. Vitals Time Taken: 8:24 AM, Height: 76 in, Weight: 244 lbs, BMI: 29.7, Temperature: 98.2 F, Pulse: 79 bpm, Respiratory Rate: 18 breaths/min, Blood Pressure: 198/81 mmHg. Respiratory normal breathing without difficulty. Psychiatric this patient is able to make decisions and demonstrates good insight into disease process. Alert and Oriented x 3. pleasant and cooperative. General Notes: Patient's wound bed did require sharp debridement to clear away some of the necrotic debris and he tolerated that today without complication. Post debridement wound bed appears to be doing much better which is excellent news I am very pleased in that regard as well. Integumentary (Hair, Skin) Wound #3 status is Open. Original cause of wound was Gradually Appeared. The wound is located on the Left Toe Great. The wound measures 1.2cm length x 0.5cm width x 0.3cm depth; 0.471cm^2 area and 0.141cm^3 volume. There is Fat Layer (Subcutaneous Tissue) Exposed exposed. There is  no tunneling or undermining noted. There is a medium amount of serous drainage noted. The wound margin is thickened. There is large (67-100%) pink granulation within the wound bed. There is a small (1-33%) amount of necrotic tissue within the wound bed including Adherent Slough. 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 There was a Excisional Skin/Subcutaneous Tissue Debridement with a total area of 0.6 sq cm performed by [name], Lejon Afzal E., PA-C. With the following instrument(s): Curette to remove Viable and Non-Viable tissue/material. Material removed includes Callus and Subcutaneous Tissue and after achieving pain control using Lidocaine. A time out was conducted at 08:53, prior to the start of the procedure. There was no bleeding. The procedure was tolerated well. Post Debridement Measurements: 1.2cm length x 0.5cm width x 0.3cm depth; 0.141cm^3 volume. Timothy Gibson, Timothy Gibson ([id number]) Character of Wound/Ulcer Post Debridement is stable. Severity of Tissue Post Debridement is: Fat layer exposed. Post procedure Diagnosis Wound #3: Same as Pre-Procedure Plan Wound Cleansing: Wound #3 Left Toe Great: Dial antibacterial soap, wash wounds, rinse and pat dry prior to dressing wounds May Shower, gently pat wound dry prior to applying new dressing. Anesthetic (add to Medication List): Wound #3 Left Toe Great: Topical Lidocaine 4% cream applied to wound bed prior to debridement (In Clinic Only). Primary Wound Dressing: Wound #3 Left Toe Great: Hydrafera Blue Ready Transfer Secondary Dressing: Wound #3 Left Toe Great: Conform/Kerlix Dressing Change Frequency: Wound #3 Left Toe Great: Other: - As needed Follow-up Appointments: Wound #3 Left Toe Great: Return Appointment in 2 weeks. 1. I would recommend currently that we initiate treatment with the [facility] Blue dressing we will cut a small piece to go inside the wound to be in contact and then subsequently a second piece to go external to hold this in place and help with drainage control. This should be changed every 3  days. 2. I am also can recommend currently that the patient continue with appropriate offloading using the postop shoe. 3. I am get a suggest as well that the patient can take a shower recommend he keep the dressing in place until he bathes and showers off above and then subsequently take the dressing off and clean the toe well with Dial antibacterial soap. He is in agreement with this and very happy to be able to take a full shower. We will see patient back for reevaluation in 2 weeks here in the clinic. If anything worsens or changes patient will contact our office for additional recommendations. Electronic Signature(s) Signed: 12/25/2019 9:21:53 AM By: Worthy Keeler PA-C Entered By: Worthy Keeler on 12/25/2019 09:21:53 Timothy Gibson, Timothy Gibson ([id number]) -------------------------------------------------------------------------------- SuperBill Details Patient Name: Timothy Gibson Date of Service: 12/25/2019 Medical Record Number: [id number] Patient Account Number: [id number] Date of Birth/Sex: 1951/02/05 (69 y.o. 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 I10 Essential (primary) hypertension Facility  Procedures CPT4 Code: 84665993 Description: 57017 - DEB SUBQ TISSUE 20 SQ CM/< Modifier: Quantity: 1 CPT4 Code: Description: ICD-10 Diagnosis Description 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

## 2019-12-25 NOTE — Progress Notes (Signed)
Timothy Gibson, Timothy Gibson ([id number]) Visit Report for 12/25/2019 Arrival Information Details Patient Name: Timothy Gibson Date of Service: 12/25/2019 8:45 AM Medical Record Number: [id number] Patient Account Number: [id number] Date of Birth/Sex: Mar 30, 1951 (70 y.o. M) Treating RN: Army Melia Primary Care Melvine Julin: Merrie Roof Other Clinician: Referring Myking Sar: Merrie Roof Treating Leeza Heiner/Extender: Melburn Hake, [name] Weeks in Treatment: 23 Visit Information History Since Last Visit Added or deleted any medications: No Patient Arrived: Cane Any new allergies or adverse reactions: No Arrival Time: 08:23 Had a fall or experienced change in No Accompanied By: caregiver activities of daily living that may affect Transfer Assistance: None risk of falls: Patient Identification Verified: Yes Signs or symptoms of abuse/neglect since last visito No Secondary Verification Process Completed: Yes Hospitalized since last visit: No Patient Requires Transmission-Based Precautions: No Has Dressing in Place as Prescribed: Yes Pain Present Now: No Electronic Signature(s) Signed: 12/25/2019 3:30:19 PM By: Lorine Bears RCP, RRT, CHT Entered By: Lorine Bears on 12/25/2019 08:24:19 Timothy Gibson ([id number]) -------------------------------------------------------------------------------- Encounter Discharge Information Details Patient Name: Timothy Gibson Date of Service: 12/25/2019 8:45 AM Medical Record Number: [id number] Patient Account Number: [id number] Date of Birth/Sex: Nov 23, 1950 (69 y.o. M) Treating RN: Army Melia Primary Care Blaike Vickers: Merrie Roof Other Clinician: Referring Kenedie Dirocco: Merrie Roof Treating Levada Bowersox/Extender: Melburn Hake, [name] Weeks in Treatment: 69 Encounter Discharge Information Items Post Procedure Vitals Discharge Condition: Stable Temperature (F): 98.2 Ambulatory Status: Ambulatory Pulse (bpm): 79 Discharge Destination:  Home Respiratory Rate (breaths/min): 16 Transportation: Private Auto Blood Pressure (mmHg): 198/81 Accompanied By: self Schedule Follow-up Appointment: Yes Clinical Summary of Care: Electronic Signature(s) Signed: 12/25/2019 1:58:43 PM By: Army Melia Entered By: Army Melia on 12/25/2019 08:58:29 Timothy Gibson ([id number]) -------------------------------------------------------------------------------- Lower Extremity Assessment Details Patient Name: Timothy Gibson Date of Service: 12/25/2019 8:45 AM Medical Record Number: [id number] Patient Account Number: [id number] Date of Birth/Sex: 09/13/1950 (69 y.o. M) Treating RN: Montey Hora Primary Care Paris Hohn: Merrie Roof Other Clinician: Referring Lynnette Pote: Merrie Roof Treating Taijon Vink/Extender: [name], [name] Weeks in Treatment: 23 Edema Assessment Assessed: [Left: No] [Right: No] Edema: [Left: Ye] [Right: s] Vascular Assessment Pulses: Dorsalis Pedis Palpable: [Left:Yes] Electronic Signature(s) Signed: 12/25/2019 4:14:31 PM By: Montey Hora Entered By: Montey Hora on 12/25/2019 08:32:01 Timothy Gibson ([id number]) -------------------------------------------------------------------------------- Multi Wound Chart Details Patient Name: Timothy Gibson Date of Service: 12/25/2019 8:45 AM Medical Record Number: [id number] Patient Account Number: [id number] Date of Birth/Sex: [birth year] (69 y.o. 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 Comorbid History: Lymphedema, Arrhythmia, N/A N/A Hypertension, Type II Diabetes, End Stage Renal Disease, Rheumatoid Arthritis, Osteoarthritis, Confinement Anxiety Date Acquired: 06/19/2019 N/A N/A Weeks of Treatment: 23 N/A N/A Wound Status: Open N/A N/A Measurements L x W x D (cm) 1.2x0.5x0.3 N/A N/A Area (cm) : 0.471 N/A N/A Volume (cm) : 0.141 N/A N/A % Reduction in Area: 82.60% N/A N/A % Reduction in Volume: 48.00% N/A N/A Classification: Grade 2 N/A N/A Exudate Amount: Medium N/A N/A Exudate Type: Serous N/A N/A Exudate Color: amber N/A N/A Wound Margin: Thickened N/A N/A Granulation Amount: Large (67-100%) N/A N/A Granulation Quality: Pink N/A N/A Necrotic Amount: Small (1-33%)  N/A N/A Exposed Structures: Fat Layer (Subcutaneous Tissue) N/A N/A Exposed: Yes Fascia: No Tendon: No Muscle: No Joint: No Bone: No Epithelialization: Small (1-33%) N/A N/A Treatment Notes Electronic Signature(s) Signed: 12/25/2019 1:58:43 PM By: Army Melia Entered By: Army Melia on 12/25/2019 08:51:12 Timothy Gibson, Timothy Gibson ([id number]) Timothy Gibson, Timothy Gibson ([id number]) -------------------------------------------------------------------------------- Multi-Disciplinary Care Plan Details Patient Name: Timothy Gibson Date of Service: 12/25/2019 8:45 AM Medical Record Number: [id number] Patient Account Number: [id number] Date of Birth/Sex: 02-Dec-1950 (69 y.o. M) Treating RN: Army Melia Primary Care Alijah Hyde: Merrie Roof Other Clinician: Referring Saprina Chuong: Merrie Roof Treating Selin Eisler/Extender: Melburn Hake, [name] Weeks in Treatment: 24 Active Inactive Abuse / Safety / Falls / Self Care Management Nursing Diagnoses: Potential for falls Goals: Patient will not experience any injury related to falls Date Initiated: 07/15/2019 Target Resolution Date: 10/18/2019 Goal Status: Active Interventions: Assess fall risk on admission and as needed Notes: Necrotic Tissue Nursing  Diagnoses: Impaired tissue integrity related to necrotic/devitalized tissue Goals: Necrotic/devitalized tissue will be minimized in the wound bed Date Initiated: 07/15/2019 Target Resolution Date: 10/18/2019 Goal Status: Active Interventions: Provide education on necrotic tissue and debridement process Notes: Orientation to the Wound Care Program Nursing Diagnoses: Knowledge deficit related to the wound healing center program Goals: Patient/caregiver will verbalize understanding of the [facility] Date Initiated: 07/15/2019 Target Resolution Date: 10/18/2019 Goal Status: Active Interventions: Provide education on orientation to the wound center Notes: Peripheral Neuropathy Nursing Diagnoses: Knowledge deficit related to disease process and management of peripheral neurovascular dysfunction Goals: Patient/caregiver will verbalize understanding of disease process and disease management Date Initiated: 07/15/2019 Target Resolution Date: 10/18/2019 Goal Status: Active Timothy Gibson, Timothy Gibson ([id number]) Interventions: Assess signs and symptoms of neuropathy upon admission and as needed Provide education on Management of Neuropathy and Related Ulcers Notes: Wound/Skin Impairment Nursing Diagnoses: Impaired tissue integrity Goals: Ulcer/skin breakdown will heal within 14 weeks Date Initiated: 07/15/2019 Target Resolution Date: 10/18/2019 Goal Status: Active Interventions: Assess patient/caregiver ability to obtain necessary supplies Assess patient/caregiver ability to perform ulcer/skin care regimen upon admission and as needed Assess ulceration(s) every visit Notes: Electronic Signature(s) Signed: 12/25/2019 1:58:43 PM By: Army Melia Entered By: Army Melia on 12/25/2019 08:51:03 Timothy Gibson ([id number]) -------------------------------------------------------------------------------- Pain Assessment Details Patient Name: Timothy Gibson Date of Service:  12/25/2019 8:45 AM Medical Record Number: [id number] Patient Account Number: [id number] Date of Birth/Sex: 01-Sep-1950 (69 y.o. M) Treating RN: Montey Hora Primary Care Logon Uttech: Merrie Roof Other Clinician: Referring Garrie Woodin: Merrie Roof Treating Kelsee Preslar/Extender: Melburn Hake, [name] Weeks in Treatment: 50 Active Problems Location of Pain Severity and Description of Pain Patient Has Paino No Site Locations Pain Management and Medication Current Pain Management: Electronic Signature(s) Signed: 12/25/2019 4:14:31 PM By: Montey Hora Entered By: Montey Hora on 12/25/2019 08:27:17 Timothy Gibson ([id number]) -------------------------------------------------------------------------------- Patient/Caregiver Education Details Patient Name: Timothy Gibson Date of Service: 12/25/2019 8:45 AM Medical Record Number: [id number] Patient Account Number: [id number] Date of Birth/Gender: 06-09-1951 (69 y.o. M) Treating RN: Army Melia Primary Care Physician: Merrie Roof Other Clinician: Referring Physician: Merrie Roof Treating Physician/Extender: Sharalyn Ink in Treatment: 26 Education Assessment Education Provided To: Patient Education Topics Provided Wound/Skin Impairment: Handouts: Caring for Your Ulcer Methods: Demonstration, Explain/Verbal Responses: State content correctly Electronic Signature(s) Signed: 12/25/2019 1:58:43 PM By: Army Melia Entered By: Army Melia on 12/25/2019 08:57:47 Timothy Gibson ([id number]) -------------------------------------------------------------------------------- Wound Assessment Details Patient Name: Timothy Gibson Date of Service: 12/25/2019 8:45 AM Medical Record Number: [id number] Patient Account Number: [id number] Date of Birth/Sex: [birth year] (  69 y.o. M) Treating RN: Montey Hora Primary Care Wiley Magan: Merrie Roof Other Clinician: Referring Riyana Biel: Merrie Roof Treating Mineola Duan/Extender: Melburn Hake, [name] Weeks  in Treatment: 23 Wound Status Wound Number: 3 Primary Diabetic Wound/Ulcer of the Lower Extremity Etiology: Wound Location: Left Toe Great Wound Open Wounding Event: Gradually Appeared Status: Date Acquired: 06/19/2019 Comorbid Lymphedema, Arrhythmia, Hypertension, Type II Weeks Of Treatment: 23 History: Diabetes, End Stage Renal Disease, Rheumatoid Arthritis, Clustered Wound: No Osteoarthritis, Confinement Anxiety Photos Wound Measurements Length: (cm) 1.2 % Redu Width: (cm) 0.5 % Redu Depth: (cm) 0.3 Epithe Area: (cm) 0.471 [place] Volume: (cm) 0.141 Under ction in Area: 82.6% ction in Volume: 48% lialization: Small (1-33%) ling: No mining: No Wound Description Classification: Grade 2 Foul O Wound Margin: Thickened Slough Exudate Amount: Medium Exudate Type: Serous Exudate Color: amber dor After Cleansing: No /Fibrino Yes Wound Bed Granulation Amount: Large (67-100%) Exposed Structure Granulation Quality: Pink Fascia Exposed: No Necrotic Amount: Small (1-33%) Fat Layer (Subcutaneous Tissue) Exposed: Yes Necrotic Quality: Adherent Slough Tendon Exposed: No Muscle Exposed: No Joint Exposed: No Bone Exposed: No Treatment Notes Wound #3 (Left Toe Great) Notes hblue, conform, offloading shoe Electronic Signature(s) Timothy Gibson, Timothy Gibson ([id number]) Signed: 12/25/2019 4:14:31 PM By: Montey Hora Entered By: Montey Hora on 12/25/2019 08:31:17 Timothy Gibson ([id number]) -------------------------------------------------------------------------------- Vitals Details Patient Name: Timothy Gibson Date of Service: 12/25/2019 8:45 AM Medical Record Number: [id number] Patient Account Number: [id number] Date of Birth/Sex: 1951-05-31 (69 y.o. M) Treating RN: Army Melia Primary Care Yanelie Abraha: Merrie Roof Other Clinician: Referring Tarvares Lant: Merrie Roof Treating Astou Lada/Extender: Melburn Hake, [name] Weeks in Treatment: 23 Vital Signs Time Taken:  08:24 Temperature (F): 98.2 Height (in): 76 Pulse (bpm): 79 Weight (lbs): 244 Respiratory Rate (breaths/min): 18 Body Mass Index (BMI): 29.7 Blood Pressure (mmHg): 198/81 Reference Range: 80 - 120 mg / dl Electronic Signature(s) Signed: 12/25/2019 3:30:19 PM By: Lorine Bears RCP, RRT, CHT Entered By: Lorine Bears on 12/25/2019 08:25:31

## 2019-12-27 ENCOUNTER — Other Ambulatory Visit: Payer: Self-pay | Admitting: Family Medicine

## 2019-12-27 NOTE — Telephone Encounter (Signed)
Requested Prescriptions  Pending Prescriptions Disp Refills  . losartan (COZAAR) 100 MG tablet [Pharmacy Med Name: LOSARTAN 100MG  TABLETS] 90 tablet 1    Sig: TAKE 1 TABLET(100 MG) BY MOUTH DAILY     Cardiovascular:  Angiotensin Receptor Blockers Failed - 12/27/2019  9:13 AM      Failed - Cr in normal range and within 180 days    Creatinine  Date Value Ref Range Status  08/13/2011 8.72 (H) 0.60 - 1.30 mg/dL Final   Creatinine, Ser  Date Value Ref Range Status  07/24/2019 8.43 (H) 0.61 - 1.24 mg/dL Final         Failed - K in normal range and within 180 days    Potassium  Date Value Ref Range Status  08/15/2018 4.2 3.5 - 5.2 mmol/L Final  08/13/2011 4.3 3.5 - 5.1 mmol/L Final   Potassium [facility] vascular [facility])  Date Value Ref Range Status  06/04/2018 3.7 3.5 - 5.1 Final    Comment:    Performed at [facility], [address] [place], [place] [address]         Failed - Last BP in normal range    BP Readings from Last 1 Encounters:  10/29/19 (!) 163/80         Passed - Patient is not pregnant      Passed - Valid encounter within last 6 months    Recent Outpatient Visits          1 month ago End stage renal disease ([place])   [place], Rachel Elizabeth, PA-C   4 months ago Osteomyelitis of ankle or foot, acute, right [facility])   [facility], [place], PA-C   5 months ago Diabetic ulcer of left great toe [facility])   Hamer, Jolene T, NP   6 months ago Diabetic ulcer of toe of left foot associated with diabetes mellitus due to underlying condition, limited to breakdown of skin [facility])   [facility], [place], [place]   10 months ago Skin ulcer of right foot, limited to breakdown of skin [facility])   [place], [place], DO

## 2020-01-08 ENCOUNTER — Other Ambulatory Visit: Payer: Self-pay

## 2020-01-08 ENCOUNTER — Encounter: Payer: Medicare Other | Attending: Physician Assistant | Admitting: Physician Assistant

## 2020-01-08 DIAGNOSIS — Z992 Dependence on renal dialysis: Secondary | ICD-10-CM | POA: Diagnosis not present

## 2020-01-08 DIAGNOSIS — F419 Anxiety disorder, unspecified: Secondary | ICD-10-CM | POA: Insufficient documentation

## 2020-01-08 DIAGNOSIS — E1122 Type 2 diabetes mellitus with diabetic chronic kidney disease: Secondary | ICD-10-CM | POA: Insufficient documentation

## 2020-01-08 DIAGNOSIS — M069 Rheumatoid arthritis, unspecified: Secondary | ICD-10-CM | POA: Insufficient documentation

## 2020-01-08 DIAGNOSIS — N186 End stage renal disease: Secondary | ICD-10-CM | POA: Insufficient documentation

## 2020-01-08 DIAGNOSIS — E11621 Type 2 diabetes mellitus with foot ulcer: Secondary | ICD-10-CM | POA: Diagnosis not present

## 2020-01-08 DIAGNOSIS — M86672 Other chronic osteomyelitis, left ankle and foot: Secondary | ICD-10-CM | POA: Diagnosis not present

## 2020-01-08 DIAGNOSIS — Z794 Long term (current) use of insulin: Secondary | ICD-10-CM | POA: Insufficient documentation

## 2020-01-08 DIAGNOSIS — F17290 Nicotine dependence, other tobacco product, uncomplicated: Secondary | ICD-10-CM | POA: Diagnosis not present

## 2020-01-08 DIAGNOSIS — I120 Hypertensive chronic kidney disease with stage 5 chronic kidney disease or end stage renal disease: Secondary | ICD-10-CM | POA: Diagnosis not present

## 2020-01-08 DIAGNOSIS — L97522 Non-pressure chronic ulcer of other part of left foot with fat layer exposed: Secondary | ICD-10-CM | POA: Diagnosis not present

## 2020-01-08 NOTE — Progress Notes (Addendum)
Timothy Gibson, Timothy Gibson ([id number]) Visit Report for 01/08/2020 Chief Complaint Document Details Patient Name: Timothy Gibson, Timothy Gibson Date of Service: 01/08/2020 9:00 AM Medical Record Number: [id number] Patient Account Number: [id number] Date of Birth/Sex: 06-17-1951 (69 y.o. M) Treating RN: Army Melia Primary Care Provider: Merrie Roof Other Clinician: Referring Provider: Merrie Roof Treating Provider/Extender: Melburn Hake, Jesika Men Weeks in Treatment: 25 Information Obtained from: Patient Chief Complaint Left Great Toe Electronic Signature(s) Signed: 01/08/2020 9:04:59 AM By: Worthy Keeler PA-C Entered By: Worthy Keeler on 01/08/2020 09:04:59 Timothy Gibson ([id number]) -------------------------------------------------------------------------------- Debridement Details Patient Name: Timothy Gibson Date of Service: 01/08/2020 9:00 AM Medical Record Number: [id number] Patient Account Number: [id number] Date of Birth/Sex: 1951-06-15 (69 y.o. M) Treating RN: Army Melia Primary Care Provider: Merrie Roof Other Clinician: Referring Provider: Merrie Roof Treating Provider/Extender: Melburn Hake, Imanie Darrow Weeks in Treatment: 25 Debridement Performed for Wound #3 Left Toe Great Assessment: Performed By: Physician [name], Keondra Haydu E., PA-C Debridement Type: Debridement Severity of Tissue Pre Debridement: Fat layer exposed Level of Consciousness (Pre- Awake and Alert procedure): Pre-procedure Verification/Time Out Yes - 09:25 Taken: Start Time: 09:25 Pain Control: Lidocaine Total Area Debrided (L x W): 0.4 (cm) x 0.7 (cm) = 0.28 (cm) Tissue and other material Viable, Non-Viable, Callus, Slough, Subcutaneous, Slough debrided: Level: Skin/Subcutaneous Tissue Debridement Description: Excisional Instrument: Curette Bleeding: Minimum Hemostasis Achieved: Pressure End Time: 09:26 Response to Treatment: Procedure was tolerated well Level of Consciousness (Post- Awake and  Alert procedure): Post Debridement Measurements of Total Wound Length: (cm) 0.4 Width: (cm) 0.7 Depth: (cm) 0.2 Volume: (cm) 0.044 Character of Wound/Ulcer Post Debridement: Stable Severity of Tissue Post Debridement: Fat layer exposed Post Procedure Diagnosis Same as Pre-procedure Electronic Signature(s) Signed: 01/08/2020 4:03:52 PM By: Worthy Keeler PA-C Signed: 01/09/2020 10:21:07 AM By: Army Melia Previous Signature: 01/08/2020 3:55:13 PM Version By: Army Melia Entered By: Worthy Keeler on 01/08/2020 16:03:51 Timothy Gibson ([id number]) -------------------------------------------------------------------------------- HPI Details Patient Name: Timothy Gibson Date of Service: 01/08/2020 9:00 AM Medical Record Number: [id number] Patient Account Number: [id number] Date of Birth/Sex: 1950-11-25 (69 y.o. M) Treating RN: Army Melia Primary Care Provider: Merrie Roof Other Clinician: Referring Provider: Merrie Roof Treating Provider/Extender: Melburn Hake, Saleemah Mollenhauer Weeks in Treatment: 25 History of Present Illness HPI Description: 11/27/17 on evaluation today patient presents for initial evaluation concerning an issue that he has been having with his right great toe which began last Thursday 11/22/17. He has a history of diabetes mellitus type to which he has had for greater than 30 years currently he is on insulin. Subsequently he also has a history of hypertension. On physical exam inspection is also appears he may have some peripheral vascular disease with his ABI's being noncompressible bilaterally. He is on dialysis as well and this is on Monday, Wednesday, and Friday. Patient was hospitalized back in January/February 2019 although this was more for stomach/back pain though they never told me exactly what was going on. This is according to the patient. Subsequently the ulcer which is between the third and fourth toe when space of the right foot as well is on the plantar aspect of  the fourth toe is due to him having cleaned his toes this morning and he states that his finger which is large split the toe tissue in between causing the wounds that we currently see. With that being said he states this has happened before I explained I would definitely recommend that he not do this any longer. He can use a small  soft washcloth to clean between the toes without causing trauma. Subsequently the right great toe hatchery does show evidence of necrotic tissue present on the surface of the wound specifically there is callous and dead skin surrounding which is trapping fluid causing problems as far as the wound is concerned. The surface of the wound does show slough although due to his vascular flow I'm not going to sharply debride this today I think I will selectively debride away the necrotic skin as well is callous from around the surrounding so this will not continue to be a moisture issue. Nonetheless the patient has no pain he does have diabetic neuropathy. 12/06/17-He is here in follow-up evaluation for a right great toe ulcer. He is voicing no complaints or concerns. He continues to infrequently/socially smokes cigars with no desire to quit. He has been compliant with offloading, using open toed surgical shoe; has been compliant using Santyl daily. He continues on clindamycin although takes it inconsistently secondary to indigestion. The plain film x-ray performed on 5/23 impression: Soft tissue swelling of the left first toe and is noted with probable irregular lucency involving distal tuft of first distal phalanx concerning for osteomyelitis. MRI may be performed for further evaluation; MRI ordered. The vascular evaluation performed on 5/24 field non- compressible ABIs bilaterally and reduced TBI bilaterally suggesting significant tibial disease. He will be referred to vascular medicine for further evaluation. 12/13/17-He is here in follow-up evaluation for right great toe ulcer. He  has appointment next Thursday for the MRI and vascular evaluation. Wound culture obtained last week grew abundant Klebsiella oxytoca (multidrug sensitivity), and abundant enteric coccus faecalis (sensitive to ampicillin). Ampicillin and Cipro were called in, along with a probiotic. In light of his appointments next Thursday he will follow-up in 2 weeks, we will continue with Santyl 12/27/17-He is here for evaluation for a right great toe ulcer. 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 He did have evaluation by vascular medicine, Dr. Ronalee Belts, on 6/13, at that appointment it was decided for him to undergo angiography with possible intervention but he refused and is still considering. If he chooses to have it done he will contact the office. He has not heard back from either ID offices that he was referred to two weeks ago: [place] and [place]. there is improvement in both appearance and measurement to the ulcer. We will extend antibiotic therapy (amoxicillin 500 every 12, Cipro 500 daily) for additional 2 weeks pending infectious disease consult, he will continue with Santyl daily. He was reminded to continue with probiotic therapy while on oral antibiotics; he denies any GI disturbance. 01/03/18-He is here in follow-up evaluation for right great toe ulcer. He is decided to not undergo any vascular intervention at this time. He was established with [facility] infectious disease (Dr. Linus Salmons) last week initiated on vancomycin and cefazolin with dialysis treatment for 6 weeks. We will continue with current treatment plan with Santyl and offloading and he will follow-up in 2 weeks 01/17/18-He is seen in follow-up evaluation for right great toe ulcer. There is improvement in appearance with less nonviable tissue present. We will continue with same treatment plan he will follow-up next week. He is tolerating IV antibiotics without  any complications 8/56/31-SH is seen in follow up evaluation for right great toe ulcer. There continues to be improvement. He is tolerating IV antibiotics with hemodialysis, completion date of 7/31. We will transition to collagen and and follow-up  next week 02/07/18-He is here in follow up for a right great toe ulcer. There is red granulation tissue throughout the wound, improved in appearance. He completed antibiotics yesterday. He saw podiatry last Thursday for nail trimming, at that appointment he periwound callus was trimmed and a culture was obtained; culture negative. He does not have a follow up appointment with infectious disease. We will submit for grafix. 02/14/18-He is here in follow-up evaluation for a right great toe ulcer. There is slow improvement, red granulation tissue throughout. The insurance approval for grafix is pending. We will continue with collagen and he'll follow-up next week 02/21/18-He is seen in follow-up evaluation for right great toe ulcer. There is improvement with red granulation tissue throughout. He was approved for oasis and this was placed today. He will follow up next week 02/28/18-He is seen in follow-up evaluation for right great toe ulcer. The wound is stable, oasis applied and he'll follow-up next week 03/07/18-He is seen in follow-up violation of her right great toe ulcer. The wound is dry, no evidence of drainage. The wound appears healed today. He was painted with Betadine, instructed to paint with Betadine daily, cover with band-aid for the next week and then cover with band-aid for the following week continue with surgical shoe. He was encouraged to contact the clinic with any evidence of drainage, or change in appearance to toe/wound. He will be discharged from wound care services Readmission: 07/14/18 on evaluation today patient presents for initial evaluation our clinic concerning issues that he is having with his left great toe. We previously saw him in  2019 for his right great toe which was actually worse at that time and subsequently was able to be completely healed in the end although it did take several months. With that being said the patient is currently having an area on his left great toe that occurred initially as result of a blister that came up he's not really sure why or how. Subsequently this was initially treated by his podiatrist though the recommendation there was apparently amputation according to the patient and his daughter who was present with him today. With that being said the patient did not want to proceed with amputation and would like to try to perform wound care and get this to heal without having to go down that road. We were able to do that with his other toe and so he would like to at least give that a try before going forward with amputation. Again I completely understand his concern in this regard. With that being said I did discuss with the patient obviously that it's always a chance that we cannot get this to heal with normal wound care measures but obviously we will give it our best shot. He does actually have an appointment scheduled for later today with the vascular specialist in order to evaluate his blood flow. That was at the recommendation of his podiatrist as well. Obviously if he is having good arterial flow and nonetheless is still experiencing issues with having the wound delay in healing then it may simply be a matter of we need to initiate more aggressive wound to therapy in order to see if we can get this to heal. No fevers, chills, KONNAR, BEN. ([id number]) nausea, or vomiting noted at this time. The patient notes that this woman has been present for approximately three weeks. He is not a smoker. His most recent hemoglobin A1c was 6.2 on 06/18/19. 07/17/2019 on evaluation today patient appears unfortunately  to not be doing as well. He did see vascular I did review the note as well. That was  on 07/15/2019. Subsequently there can actually be taking him to the OR for an angiogram due to poor arterial flow into this extremity. Apparently the atherosclerotic changes were severe. The patient is stated to be at risk for limb loss. Nonetheless the good news is this is being scheduled fairly quickly he will have the surgery/procedure next Thursday. With that being said I am going also go ahead based on the results of his x-ray which showed some chance of osteomyelitis in the distal portion of the toe get the patient set up for an MRI to further evaluate for the possibility of osteomyelitis obviously if he does have osteomyelitis we need to know so that we can address this appropriately. 07/29/2019 upon evaluation today patient appears to be doing better in general with regard to his toe ulcer. He does have much better blood flow you can actually feel a palpable pulse which appears to be very strong at this time. I did review his arterial intervention/angiogram which did show that he had evidence of sufficient arterial flow restoration at this point. He did have occlusions that were 60 and 80% that residually were only 10 and 25% with no limiting flow. This is excellent news and hopefully he will continue to show signs of improvement in light of the improved vascular status. With that being said the toe mainly shows somewhat of an eschar today I think that this is fairly stable and as such probably would be best not to really do much in the way of debridement at this point I do believe Betadine would likely be a good option. 08/07/2019 on evaluation today patient actually appears to be doing about the same. The one difference is the eschar that we were just seeing if we can maintain is actually starting to loosen up and leak around the edges I think it is time to actually go ahead and remove this today. Is also no longer stable is very soft. Nonetheless the patient is okay with proceeding with  debridement at this point. Fortunately there is no signs of active infection. No fevers, chills, nausea, vomiting, or diarrhea. The patient states that he took his last antibiotic that he had last night. He would need a refill as he will not be seeing infectious disease until February 2. Nonetheless obviously I do not want him to have any break in therapy especially when he is doing as well as he is currently. 08/14/2019 on evaluation today patient appears to be doing quite well all things considered in regard to his toe. I do feel like he is making progress and though this is not completely healed by any means we still have some ways to go I feel like he is making progress which is excellent. There is no sign of active infection at this time systemically. He does have osteomyelitis of the distal phalanx of his great toe on the left. He did see Dr. Linus Salmons. That is the infectious disease doctor in [place] where he was referred. Dr. Linus Salmons apparently according to the note did not feel like the patient's MRI revealed osteomyelitis but just reactive marrow and subsequently did not recommend any antibiotic therapy whatsoever. To be peripherally honest I am not really in agreement with this based on the MRI results and what we are seeing and I think he has been responding at least decently well to oral medication as well.  Nonetheless we may want to check into the possibility of IV antibiotics being administered at the dialysis center. I will discuss this with Dr. Dellia Nims further. 08/21/2019 upon evaluation today patient appears at this point to be making some progress with regard to his wounds. He has been tolerating the dressing changes without complication. Fortunately there is no evidence of active infection at this time. Overall I feel like he is doing well with the oral antibiotics I discussed with Dr. Dellia Nims the possibility of doing IV antibiotic therapy versus continuing with the oral. His opinion was if  the patient is doing well with oral to maybe stick with that. Obviously we can initiate additional Cipro as well just to help ensure that there is nothing worsening here. The Cipro should be beneficial for him as well. That helps cover more gram-negative's were is at the doxycycline is more gram- positive's. 09/02/2019 on evaluation today patient appears to be doing fairly well upon inspection today. With that being said though the patient is continuing to do well he does also continue to develop issues with buildup of necrotic tissue on the distal portion of his toe he does have good arterial flow however. For that reason I am going to suggest that what we may need to do is consider using something such as Santyl to try to keep this free and clear. Also discussed in greater detail HBO therapy with him today please see plan for additional recommendations and results of this discussion. 09/11/2019 upon evaluation today patient appears to be doing well with regard to his toe ulcer. This actually appears to be much better today using the Santyl than it was previous. Fortunately there is no signs of active infection. No fevers, chills, nausea, vomiting, or diarrhea. 09/18/2019 upon evaluation today patient appears to be doing much better in regard to his wound in general. The Santyl was doing a good job at loosening stuff up here for [place] which is excellent news. With that being said he is still taking the oral antibiotic. I sent this in last for him on 09/03/2019. This was the doxycycline. When this runs out he will actually be complete as far as the treatment is concerned. With that being said I am getting continue the Cipro for 1 additional month. Overall his wound seems to be doing quite well. 09/25/2019 upon evaluation today patient appears to be doing well with regard to his toe ulcer. This is measuring much better and overall seems to be making good progress. Fortunately there is no signs of active  infection at this time. No fevers, chills, nausea, vomiting, or diarrhea. 10/02/2019 upon evaluation today patient appears to be doing well with regard to his wound. He has been tolerating the dressing changes without complication. Fortunately there is no signs of active infection at this time. No fevers, chills, nausea, vomiting, or diarrhea. Overall I am extremely pleased with how things seem to be progressing. I think the wound bed is at the point now where we can likely proceed with additional measures to try to get this healed more quickly I think Dermagraft may be a good possibility for the patient. We can definitely look into seeing about approval for this. 10/09/2019 upon evaluation today patient's wound actually appears to be doing well although it still is progressing somewhat slowly compared to what I would like to see. With that being said I did order Apligraf for the patient which I felt would keep the wound nice and moist without allowing it  to dry out and hopefully allow this to heal much more effectively and quickly. The patient actually did obtain approval and therefore we can apply that today. 10/23/2019 upon evaluation today patient's wound actually appears to be doing significantly better with regard to his toe ulcer. The tissue at the base of the wound is greatly improved and overall I am very pleased with the progress made. We did use Apligraf 2 weeks ago on the toe that did excellent for him much it has completely dissolved after as of the 2 weeks. They only had to change the dressing a few times due to drainage through. Obviously they left the Steri-Strips and Mepitel in place. Unfortunately we do not have the Apligraf available today for application regard therefore have to use a different product here in the clinic today and then order the Apligraf for next Thursday. That will be his second application. 4/22; patient arrived with overhanging skin and subcutaneous tissue on the  wound margins this was removed. Apligraf #2 applied 11/13/2019 upon evaluation today patient actually appears to be doing well with regard to his foot ulcer. He has been tolerating the dressing changes without complication. The overall wound bed appears to be healthier and has filled in as well as not nearly as deep as it was the tissue is also better quality. Overall I am extremely pleased with how things seem to be progressing. The patient is likewise very happy in this regard. 5/20; wound is measuring smaller had Apligraf #3 last time. Apligraf #4 today Timothy Gibson, Timothy Gibson ([id number]) 12/12/2019 upon evaluation today patient actually appears to be doing quite well with regard to his wound in my opinion. The ulcer is significantly smaller compared to last time I saw him although that has been several weeks. I do believe the Apligraf has been beneficial he has 1 remaining today this will be application #5. 09/04/3333 upon evaluation today patient appears to be doing well with regard to his toe ulcer. He is measuring about the same today but he has come a long way from where this started. Fortunately there is no evidence of active infection which is great news and overall I am very pleased with the progress that is been made. Nonetheless at this point we have completed the Apligraf course he did see improvement through this but I am going to switch to something different as of today. 01/08/2020 upon evaluation today patient appears to be doing much better in regards to his toe ulcer this is measuring smaller and overall very pleased with the progress. The Hydrofera Blue seems to be doing a great job. Fortunately there is no evidence of active infection at this time. No fevers, chills, nausea, vomiting, or diarrhea. Electronic Signature(s) Signed: 01/08/2020 10:11:05 AM By: Worthy Keeler PA-C Entered By: Worthy Keeler on 01/08/2020 10:11:04 Timothy Gibson, Timothy Gibson  ([id number]) -------------------------------------------------------------------------------- Physical Exam Details Patient Name: Timothy Gibson Date of Service: 01/08/2020 9:00 AM Medical Record Number: [id number] Patient Account Number: [id number] Date of Birth/Sex: 11-17-1950 (69 y.o. M) Treating RN: Army Melia Primary Care Provider: Merrie Roof Other Clinician: Referring Provider: Merrie Roof Treating Provider/Extender: [name], Aronda Burford Weeks in Treatment: 25 Constitutional Well-nourished and well-hydrated in no acute distress. Respiratory normal breathing without difficulty. Psychiatric this patient is able to make decisions and demonstrates good insight into disease process. Alert and Oriented x 3. pleasant and cooperative. Notes Upon inspection patient's wound bed actually showed signs of good granulation there was some minimal slough noted on the surface  of the wound which I did perform sharp debridement on today patient tolerated that without complication. Post debridement wound bed appears to be doing much better which is great news. Electronic Signature(s) Signed: 01/08/2020 4:01:51 PM By: Worthy Keeler PA-C Entered By: Worthy Keeler on 01/08/2020 16:01:50 Timothy Gibson ([id number]) -------------------------------------------------------------------------------- Physician Orders Details Patient Name: Timothy Gibson Date of Service: 01/08/2020 9:00 AM Medical Record Number: [id number] Patient Account Number: [id number] Date of Birth/Sex: 04-11-1951 (69 y.o. M) Treating RN: Army Melia Primary Care Provider: Merrie Roof Other Clinician: Referring Provider: Merrie Roof Treating Provider/Extender: Melburn Hake, Akif Weldy Weeks in Treatment: 81 Verbal / Phone Orders: No Diagnosis Coding ICD-10 Coding Code Description [phone] Other chronic osteomyelitis, left ankle and foot E11.621 Type 2 diabetes mellitus with foot ulcer L97.522 Non-pressure chronic ulcer of other  part of left foot with fat layer exposed I10 Essential (primary) hypertension Wound Cleansing Wound #3 Left Toe Great o Dial antibacterial soap, wash wounds, rinse and pat dry prior to dressing wounds o May Shower, gently pat wound dry prior to applying new dressing. Anesthetic (add to Medication List) Wound #3 Left Toe Great o Topical Lidocaine 4% cream applied to wound bed prior to debridement (In Clinic Only). Primary Wound Dressing Wound #3 Left Toe Great o Hydrafera Blue Ready Transfer Secondary Dressing Wound #3 Left Toe Great o Conform/Kerlix Dressing Change Frequency Wound #3 Left Toe Great o Other: - As needed Follow-up Appointments Wound #3 Left Toe Great o Return Appointment in 3 weeks. Electronic Signature(s) Signed: 01/08/2020 3:55:13 PM By: Army Melia Signed: 01/08/2020 4:07:16 PM By: Worthy Keeler PA-C Entered By: Army Melia on 01/08/2020 09:34:19 Timothy Gibson, Timothy Gibson ([id number]) -------------------------------------------------------------------------------- Problem List Details Patient Name: Timothy Gibson Date of Service: 01/08/2020 9:00 AM Medical Record Number: [id number] Patient Account Number: [id number] Date of Birth/Sex: 1950-11-19 (69 y.o. M) Treating RN: Army Melia Primary Care Provider: Merrie Roof Other Clinician: Referring Provider: Merrie Roof Treating Provider/Extender: Melburn Hake, Xzavien Harada Weeks in Treatment: 25 Active Problems ICD-10 Encounter Code Description Active Date MDM Diagnosis [phone] Other chronic osteomyelitis, left ankle and foot 07/29/2019 No Yes E11.621 Type 2 diabetes mellitus with foot ulcer 07/15/2019 No Yes L97.522 Non-pressure chronic ulcer of other part of left foot with fat layer 07/15/2019 No Yes exposed [place] (primary) hypertension 07/15/2019 No Yes Inactive Problems Resolved Problems Electronic Signature(s) Signed: 01/08/2020 9:04:53 AM By: Worthy Keeler PA-C Entered By: Worthy Keeler on  01/08/2020 09:04:53 Timothy Gibson ([id number]) -------------------------------------------------------------------------------- Progress Note Details Patient Name: Timothy Gibson Date of Service: 01/08/2020 9:00 AM Medical Record Number: [id number] Patient Account Number: [id number] Date of Birth/Sex: Apr 14, 1951 (69 y.o. M) Treating RN: Army Melia Primary Care Provider: Merrie Roof Other Clinician: Referring Provider: Merrie Roof Treating Provider/Extender: Melburn Hake, Raney Koeppen Weeks in Treatment: 25 Subjective Chief Complaint Information obtained from Patient Left Great Toe History of Present Illness (HPI) 11/27/17 on evaluation today patient presents for initial evaluation concerning an issue that he has been having with his right great toe which began last Thursday 11/22/17. He has a history of diabetes mellitus type to which he has had for greater than 30 years currently he is on insulin. Subsequently he also has a history of hypertension. On physical exam inspection is also appears he may have some peripheral vascular disease with his ABI's being noncompressible bilaterally. He is on dialysis as well and this is on Monday, Wednesday, and Friday. Patient was hospitalized back in January/February 2019 although this was more for stomach/back  pain though they never told me exactly what was going on. This is according to the patient. Subsequently the ulcer which is between the third and fourth toe when space of the right foot as well is on the plantar aspect of the fourth toe is due to him having cleaned his toes this morning and he states that his finger which is large split the toe tissue in between causing the wounds that we currently see. With that being said he states this has happened before I explained I would definitely recommend that he not do this any longer. He can use a small soft washcloth to clean between the toes without causing trauma. Subsequently the right great toe  hatchery does show evidence of necrotic tissue present on the surface of the wound specifically there is callous and dead skin surrounding which is trapping fluid causing problems as far as the wound is concerned. The surface of the wound does show slough although due to his vascular flow I'm not going to sharply debride this today I think I will selectively debride away the necrotic skin as well is callous from around the surrounding so this will not continue to be a moisture issue. Nonetheless the patient has no pain he does have diabetic neuropathy. 12/06/17-He is here in follow-up evaluation for a right great toe ulcer. He is voicing no complaints or concerns. He continues to infrequently/socially smokes cigars with no desire to quit. He has been compliant with offloading, using open toed surgical shoe; has been compliant using Santyl daily. He continues on clindamycin although takes it inconsistently secondary to indigestion. The plain film x-ray performed on 5/23 impression: Soft tissue swelling of the left first toe and is noted with probable irregular lucency involving distal tuft of first distal phalanx concerning for osteomyelitis. MRI may be performed for further evaluation; MRI ordered. The vascular evaluation performed on 5/24 field non- compressible ABIs bilaterally and reduced TBI bilaterally suggesting significant tibial disease. He will be referred to vascular medicine for further evaluation. 12/13/17-He is here in follow-up evaluation for right great toe ulcer. He has appointment next Thursday for the MRI and vascular evaluation. Wound culture obtained last week grew abundant Klebsiella oxytoca (multidrug sensitivity), and abundant enteric coccus faecalis (sensitive to ampicillin). Ampicillin and Cipro were called in, along with a probiotic. In light of his appointments next Thursday he will follow-up in 2 weeks, we will continue with Santyl 12/27/17-He is here for evaluation for a right  great toe ulcer. 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 He did have evaluation by vascular medicine, Dr. Ronalee Belts, on 6/13, at that appointment it was decided for him to undergo angiography with possible intervention but he refused and is still considering. If he chooses to have it done he will contact the office. He has not heard back from either ID offices that he was referred to two weeks ago: [place] and [place]. there is improvement in both appearance and measurement to the ulcer. We will extend antibiotic therapy (amoxicillin 500 every 12, Cipro 500 daily) for additional 2 weeks pending infectious disease consult, he will continue with Santyl daily. He was reminded to continue with probiotic therapy while on oral antibiotics; he denies any GI disturbance. 01/03/18-He is here in follow-up evaluation for right great toe ulcer. He is decided to not undergo any vascular intervention at this time. He was established with [facility] infectious disease (Dr. Linus Salmons) last week initiated  on vancomycin and cefazolin with dialysis treatment for 6 weeks. We will continue with current treatment plan with Santyl and offloading and he will follow-up in 2 weeks 01/17/18-He is seen in follow-up evaluation for right great toe ulcer. There is improvement in appearance with less nonviable tissue present. We will continue with same treatment plan he will follow-up next week. He is tolerating IV antibiotics without any complications 0/62/69-SW is seen in follow up evaluation for right great toe ulcer. There continues to be improvement. He is tolerating IV antibiotics with hemodialysis, completion date of 7/31. We will transition to collagen and and follow-up next week 02/07/18-He is here in follow up for a right great toe ulcer. There is red granulation tissue throughout the wound, improved in appearance. He completed antibiotics yesterday.  He saw podiatry last Thursday for nail trimming, at that appointment he periwound callus was trimmed and a culture was obtained; culture negative. He does not have a follow up appointment with infectious disease. We will submit for grafix. 02/14/18-He is here in follow-up evaluation for a right great toe ulcer. There is slow improvement, red granulation tissue throughout. The insurance approval for grafix is pending. We will continue with collagen and he'll follow-up next week 02/21/18-He is seen in follow-up evaluation for right great toe ulcer. There is improvement with red granulation tissue throughout. He was approved for oasis and this was placed today. He will follow up next week 02/28/18-He is seen in follow-up evaluation for right great toe ulcer. The wound is stable, oasis applied and he'll follow-up next week 03/07/18-He is seen in follow-up violation of her right great toe ulcer. The wound is dry, no evidence of drainage. The wound appears healed today. He was painted with Betadine, instructed to paint with Betadine daily, cover with band-aid for the next week and then cover with band-aid for the following week continue with surgical shoe. He was encouraged to contact the clinic with any evidence of drainage, or change in appearance to toe/wound. He will be discharged from wound care services Readmission: 07/14/18 on evaluation today patient presents for initial evaluation our clinic concerning issues that he is having with his left great toe. We previously saw him in 2019 for his right great toe which was actually worse at that time and subsequently was able to be completely healed in the end although it did take several months. With that being said the patient is currently having an area on his left great toe that occurred initially as result of a blister that came up he's not really sure why or how. Subsequently this was initially treated by his podiatrist though the recommendation there was  apparently amputation according to the patient and his daughter who was present with him today. With that being said the patient did not want to proceed with amputation and would like to try to perform wound care and get this to heal without having to go down that road. We were able to do that with his other toe and so he would like to at least give that a try before going forward with amputation. Timothy Gibson, Timothy Gibson ([id number]) Again I completely understand his concern in this regard. With that being said I did discuss with the patient obviously that it's always a chance that we cannot get this to heal with normal wound care measures but obviously we will give it our best shot. He does actually have an appointment scheduled for later today with the vascular specialist in order to evaluate  his blood flow. That was at the recommendation of his podiatrist as well. Obviously if he is having good arterial flow and nonetheless is still experiencing issues with having the wound delay in healing then it may simply be a matter of we need to initiate more aggressive wound to therapy in order to see if we can get this to heal. No fevers, chills, nausea, or vomiting noted at this time. The patient notes that this woman has been present for approximately three weeks. He is not a smoker. His most recent hemoglobin A1c was 6.2 on 06/18/19. 07/17/2019 on evaluation today patient appears unfortunately to not be doing as well. He did see vascular I did review the note as well. That was on 07/15/2019. Subsequently there can actually be taking him to the OR for an angiogram due to poor arterial flow into this extremity. Apparently the atherosclerotic changes were severe. The patient is stated to be at risk for limb loss. Nonetheless the good news is this is being scheduled fairly quickly he will have the surgery/procedure next Thursday. With that being said I am going also go ahead based on the results of his x-ray  which showed some chance of osteomyelitis in the distal portion of the toe get the patient set up for an MRI to further evaluate for the possibility of osteomyelitis obviously if he does have osteomyelitis we need to know so that we can address this appropriately. 07/29/2019 upon evaluation today patient appears to be doing better in general with regard to his toe ulcer. He does have much better blood flow you can actually feel a palpable pulse which appears to be very strong at this time. I did review his arterial intervention/angiogram which did show that he had evidence of sufficient arterial flow restoration at this point. He did have occlusions that were 60 and 80% that residually were only 10 and 25% with no limiting flow. This is excellent news and hopefully he will continue to show signs of improvement in light of the improved vascular status. With that being said the toe mainly shows somewhat of an eschar today I think that this is fairly stable and as such probably would be best not to really do much in the way of debridement at this point I do believe Betadine would likely be a good option. 08/07/2019 on evaluation today patient actually appears to be doing about the same. The one difference is the eschar that we were just seeing if we can maintain is actually starting to loosen up and leak around the edges I think it is time to actually go ahead and remove this today. Is also no longer stable is very soft. Nonetheless the patient is okay with proceeding with debridement at this point. Fortunately there is no signs of active infection. No fevers, chills, nausea, vomiting, or diarrhea. The patient states that he took his last antibiotic that he had last night. He would need a refill as he will not be seeing infectious disease until February 2. Nonetheless obviously I do not want him to have any break in therapy especially when he is doing as well as he is currently. 08/14/2019 on evaluation  today patient appears to be doing quite well all things considered in regard to his toe. I do feel like he is making progress and though this is not completely healed by any means we still have some ways to go I feel like he is making progress which is excellent. There is no sign  of active infection at this time systemically. He does have osteomyelitis of the distal phalanx of his great toe on the left. He did see Dr. Linus Salmons. That is the infectious disease doctor in [place] where he was referred. Dr. Linus Salmons apparently according to the note did not feel like the patient's MRI revealed osteomyelitis but just reactive marrow and subsequently did not recommend any antibiotic therapy whatsoever. To be peripherally honest I am not really in agreement with this based on the MRI results and what we are seeing and I think he has been responding at least decently well to oral medication as well. Nonetheless we may want to check into the possibility of IV antibiotics being administered at the dialysis center. I will discuss this with Dr. Dellia Nims further. 08/21/2019 upon evaluation today patient appears at this point to be making some progress with regard to his wounds. He has been tolerating the dressing changes without complication. Fortunately there is no evidence of active infection at this time. Overall I feel like he is doing well with the oral antibiotics I discussed with Dr. Dellia Nims the possibility of doing IV antibiotic therapy versus continuing with the oral. His opinion was if the patient is doing well with oral to maybe stick with that. Obviously we can initiate additional Cipro as well just to help ensure that there is nothing worsening here. The Cipro should be beneficial for him as well. That helps cover more gram-negative's were is at the doxycycline is more gram- positive's. 09/02/2019 on evaluation today patient appears to be doing fairly well upon inspection today. With that being said though the  patient is continuing to do well he does also continue to develop issues with buildup of necrotic tissue on the distal portion of his toe he does have good arterial flow however. For that reason I am going to suggest that what we may need to do is consider using something such as Santyl to try to keep this free and clear. Also discussed in greater detail HBO therapy with him today please see plan for additional recommendations and results of this discussion. 09/11/2019 upon evaluation today patient appears to be doing well with regard to his toe ulcer. This actually appears to be much better today using the Santyl than it was previous. Fortunately there is no signs of active infection. No fevers, chills, nausea, vomiting, or diarrhea. 09/18/2019 upon evaluation today patient appears to be doing much better in regard to his wound in general. The Santyl was doing a good job at loosening stuff up here for [place] which is excellent news. With that being said he is still taking the oral antibiotic. I sent this in last for him on 09/03/2019. This was the doxycycline. When this runs out he will actually be complete as far as the treatment is concerned. With that being said I am getting continue the Cipro for 1 additional month. Overall his wound seems to be doing quite well. 09/25/2019 upon evaluation today patient appears to be doing well with regard to his toe ulcer. This is measuring much better and overall seems to be making good progress. Fortunately there is no signs of active infection at this time. No fevers, chills, nausea, vomiting, or diarrhea. 10/02/2019 upon evaluation today patient appears to be doing well with regard to his wound. He has been tolerating the dressing changes without complication. Fortunately there is no signs of active infection at this time. No fevers, chills, nausea, vomiting, or diarrhea. Overall I am extremely pleased  with how things seem to be progressing. I think the wound bed is  at the point now where we can likely proceed with additional measures to try to get this healed more quickly I think Dermagraft may be a good possibility for the patient. We can definitely look into seeing about approval for this. 10/09/2019 upon evaluation today patient's wound actually appears to be doing well although it still is progressing somewhat slowly compared to what I would like to see. With that being said I did order Apligraf for the patient which I felt would keep the wound nice and moist without allowing it to dry out and hopefully allow this to heal much more effectively and quickly. The patient actually did obtain approval and therefore we can apply that today. 10/23/2019 upon evaluation today patient's wound actually appears to be doing significantly better with regard to his toe ulcer. The tissue at the base of the wound is greatly improved and overall I am very pleased with the progress made. We did use Apligraf 2 weeks ago on the toe that did excellent for him much it has completely dissolved after as of the 2 weeks. They only had to change the dressing a few times due to drainage through. Obviously they left the Steri-Strips and Mepitel in place. Unfortunately we do not have the Apligraf available today for application regard therefore have to use a different product here in the clinic today and then order the Apligraf for next Thursday. That will be his second application. 4/22; patient arrived with overhanging skin and subcutaneous tissue on the wound margins this was removed. Apligraf #2 applied Timothy Gibson, Timothy Gibson ([id number]) 11/13/2019 upon evaluation today patient actually appears to be doing well with regard to his foot ulcer. He has been tolerating the dressing changes without complication. The overall wound bed appears to be healthier and has filled in as well as not nearly as deep as it was the tissue is also better quality. Overall I am extremely pleased with how things  seem to be progressing. The patient is likewise very happy in this regard. 5/20; wound is measuring smaller had Apligraf #3 last time. Apligraf #4 today 12/12/2019 upon evaluation today patient actually appears to be doing quite well with regard to his wound in my opinion. The ulcer is significantly smaller compared to last time I saw him although that has been several weeks. I do believe the Apligraf has been beneficial he has 1 remaining today this will be application #5. 4/69/6295 upon evaluation today patient appears to be doing well with regard to his toe ulcer. He is measuring about the same today but he has come a long way from where this started. Fortunately there is no evidence of active infection which is great news and overall I am very pleased with the progress that is been made. Nonetheless at this point we have completed the Apligraf course he did see improvement through this but I am going to switch to something different as of today. 01/08/2020 upon evaluation today patient appears to be doing much better in regards to his toe ulcer this is measuring smaller and overall very pleased with the progress. The Hydrofera Blue seems to be doing a great job. Fortunately there is no evidence of active infection at this time. No fevers, chills, nausea, vomiting, or diarrhea. Objective Constitutional Well-nourished and well-hydrated in no acute distress. Vitals Time Taken: 8:51 AM, Height: 76 in, Weight: 244 lbs, BMI: 29.7, Temperature: 98.0 F, Pulse: 73 bpm, Respiratory  Rate: 18 breaths/min, Blood Pressure: 185/86 mmHg. Respiratory normal breathing without difficulty. Psychiatric this patient is able to make decisions and demonstrates good insight into disease process. Alert and Oriented x 3. pleasant and cooperative. General Notes: Upon inspection patient's wound bed actually showed signs of good granulation there was some minimal slough noted on the surface of the wound which I did  perform sharp debridement on today patient tolerated that without complication. Post debridement wound bed appears to be doing much better which is great news. Integumentary (Hair, Skin) Wound #3 status is Open. Original cause of wound was Gradually Appeared. The wound is located on the Left Toe Great. The wound measures 0.4cm length x 0.7cm width x 0.2cm depth; 0.22cm^2 area and 0.044cm^3 volume. There is Fat Layer (Subcutaneous Tissue) Exposed exposed. There is a medium amount of serous drainage noted. The wound margin is thickened. There is large (67-100%) pink granulation within the wound bed. There is a small (1-33%) amount of necrotic tissue within the wound bed including Adherent Slough. 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 There was a Excisional Skin/Subcutaneous Tissue Debridement with a total area of 0.28 sq cm performed by [name], Gearold Wainer E., PA-C. With the following instrument(s): Curette to remove Viable and Non-Viable tissue/material. Material removed includes JDEN, WANT ([id number]) Callus, Subcutaneous Tissue, and Slough after achieving pain control using Lidocaine. A time out was conducted at 09:25, prior to the start of the procedure. A Minimum amount of bleeding was controlled with Pressure. The procedure was tolerated well. Post Debridement Measurements: 0.4cm length x 0.7cm width x 0.2cm depth; 0.044cm^3 volume. Character of Wound/Ulcer Post Debridement is stable. Severity of Tissue Post Debridement is: Fat layer exposed. Post procedure Diagnosis Wound #3: Same as  Pre-Procedure Plan Wound Cleansing: Wound #3 Left Toe Great: Dial antibacterial soap, wash wounds, rinse and pat dry prior to dressing wounds May Shower, gently pat wound dry prior to applying new dressing. Anesthetic (add to Medication List): Wound #3 Left Toe Great: Topical Lidocaine 4% cream applied to wound bed prior to debridement (In Clinic Only). Primary Wound Dressing: Wound #3 Left Toe Great: Hydrafera Blue Ready Transfer Secondary Dressing: Wound #3 Left Toe Great: Conform/Kerlix Dressing Change Frequency: Wound #3 Left Toe Great: Other: - As needed Follow-up Appointments: Wound #3 Left Toe Great: Return Appointment in 3 weeks. 1. I am going to suggest currently that we go ahead and continue with the [facility] dressing cut a small piece to fit into the base of the wound and then cover with a second piece as I feel like that still the best way to go. 2. I am also going to suggest the patient continue to monitor for any signs of active infection. Obviously if anything worsens or changes he will contact the office and let me know. 3. With regard to infection I do not see anything that appears to be infected at this point I think were doing well in that regard. We will see patient back for reevaluation in 3 weeks here in the clinic. If anything worsens or changes patient will contact our office for additional recommendations. Electronic Signature(s) Signed: 01/09/2020 1:41:21 PM By: Gretta Cool,  BSN, RN, CWS, Leisure centre manager, BSN Signed: 01/09/2020 1:43:29 PM By: Worthy Keeler PA-C Previous Signature: 01/08/2020 4:02:40 PM Version By: Worthy Keeler PA-C Entered By: Gretta Cool BSN, RN, CWS, Kim on 01/09/2020 13:41:20 Timothy Gibson, Timothy Gibson ([id number]) -------------------------------------------------------------------------------- [place] Details Patient Name: Timothy Gibson Date of Service: 01/08/2020 Medical Record Number: [id number] Patient Account Number: [id number] Date of Birth/Sex:  Mar 23, 1951 (69 y.o. 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 I10 Essential (primary) hypertension Facility Procedures CPT4 Code: [id number] Description: 70962 - DEB SUBQ TISSUE 20 SQ CM/< Modifier: Quantity: 1 CPT4 Code: Description: ICD-10 Diagnosis Description L97.522 Non-pressure chronic ulcer of other part of left foot with fat layer exp Modifier: osed Quantity: Physician Procedures CPT4 Code: 8366294 Description: 11042 - WC PHYS SUBQ TISS 20 SQ CM Modifier: Quantity: 1 CPT4 Code: Description: ICD-10 Diagnosis Description L97.522 Non-pressure chronic ulcer of other part of left foot with fat layer exp Modifier: osed Quantity: Electronic Signature(s) Signed: 01/08/2020 4:04:19 PM By: Worthy Keeler PA-C Previous Signature: 01/08/2020 4:03:24 PM Version By: Worthy Keeler PA-C Entered By: Worthy Keeler on 01/08/2020 16:04:19

## 2020-01-08 NOTE — Progress Notes (Signed)
RODOLPHE, EDMONSTON ([id number]) Visit Report for 01/08/2020 Arrival Information Details Patient Name: BASHAR, MILAM Date of Service: 01/08/2020 9:00 AM Medical Record Number: [id number] Patient Account Number: [id number] Date of Birth/Sex: 1950/10/24 (69 y.o. M) Treating RN: Army Melia Primary Care Laine Fonner: Merrie Roof Other Clinician: Referring Key Cen: Merrie Roof Treating Jontavius Rabalais/Extender: Melburn Hake, [name] Weeks in Treatment: 25 Visit Information History Since Last Visit Added or deleted any medications: No Patient Arrived: Cane Any new allergies or adverse reactions: No Arrival Time: 08:46 Had a fall or experienced change in No Accompanied By: caregiver activities of daily living that may affect Transfer Assistance: None risk of falls: Patient Identification Verified: Yes Signs or symptoms of abuse/neglect since last visito No Secondary Verification Process Completed: Yes Hospitalized since last visit: No Patient Requires Transmission-Based Precautions: No Implantable device outside of the clinic excluding No cellular tissue based products placed in the center since last visit: Has Dressing in Place as Prescribed: Yes Pain Present Now: No Electronic Signature(s) Signed: 01/08/2020 11:35:27 AM By: Lorine Bears RCP, RRT, CHT Entered By: Lorine Bears on 01/08/2020 08:51:23 [name] ([id number]) -------------------------------------------------------------------------------- Encounter Discharge Information Details Patient Name: [name] Date of Service: 01/08/2020 9:00 AM Medical Record Number: [id number] Patient Account Number: [id number] Date of Birth/Sex: 1951-03-03 (69 y.o. M) Treating RN: Army Melia Primary Care Burch Marchuk: Merrie Roof Other Clinician: Referring Asahd Can: Merrie Roof Treating Ethlyn Alto/Extender: Melburn Hake, [name] Weeks in Treatment: 25 Encounter Discharge Information Items Post Procedure  Vitals Discharge Condition: Stable Temperature (F): 98.0 Ambulatory Status: Ambulatory Pulse (bpm): 73 Discharge Destination: Home Respiratory Rate (breaths/min): 16 Transportation: Private Auto Blood Pressure (mmHg): 185/86 Accompanied By: family Schedule Follow-up Appointment: Yes Clinical Summary of Care: Electronic Signature(s) Signed: 01/08/2020 3:55:13 PM By: Army Melia Entered By: Army Melia on 01/08/2020 09:27:36 [name] ([id number]) -------------------------------------------------------------------------------- Lower Extremity Assessment Details Patient Name: [name] Date of Service: 01/08/2020 9:00 AM Medical Record Number: [id number] Patient Account Number: [id number] Date of Birth/Sex: October 17, 1950 (69 y.o. M) Treating RN: Army Melia Primary Care Jeris Roser: Merrie Roof Other Clinician: Referring Ryun Velez: Merrie Roof Treating Ein Rijo/Extender: Melburn Hake, [name] Weeks in Treatment: 25 Edema Assessment Assessed: [Left: No] [Right: No] Edema: [Left: N] [Right: o] Vascular Assessment Pulses: Dorsalis Pedis Palpable: [Left:Yes] Electronic Signature(s) Signed: 01/08/2020 3:55:13 PM By: Army Melia Entered By: Army Melia on 01/08/2020 09:02:01 SURESH, AUDI ([id number]) -------------------------------------------------------------------------------- Multi Wound Chart Details Patient Name: [name] Date of Service: 01/08/2020 9:00 AM Medical Record Number: [id number] Patient Account Number: [id number] Date of Birth/Sex: [birth year] (69 y.o. 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 Comorbid History: Lymphedema, Arrhythmia, N/A N/A Hypertension, Type II Diabetes, End Stage Renal Disease, Rheumatoid Arthritis, Osteoarthritis, Confinement Anxiety Date Acquired: 06/19/2019 N/A N/A Weeks of Treatment: 25 N/A N/A Wound Status: Open N/A N/A Measurements L x W x D (cm) 0.4x0.7x0.2 N/A N/A Area (cm) : 0.22 N/A N/A Volume (cm) : 0.044 N/A N/A % Reduction in Area: 91.90% N/A N/A % Reduction in Volume: 83.80% N/A N/A Classification: Grade 2 N/A N/A Exudate Amount: Medium N/A N/A Exudate Type: Serous N/A N/A Exudate Color: amber N/A N/A Wound  Margin: Thickened N/A N/A Granulation Amount: Large (67-100%) N/A N/A Granulation Quality: Pink N/A N/A Necrotic Amount: Small (1-33%) N/A N/A Exposed Structures: Fat Layer (Subcutaneous Tissue) N/A N/A Exposed: Yes Fascia: No Tendon: No Muscle: No Joint: No Bone: No Epithelialization: Small (1-33%) N/A N/A Treatment Notes Electronic Signature(s) Signed: 01/08/2020 3:55:13 PM By: Army Melia Entered By: Army Melia on 01/08/2020 09:02:15 DEWITT, JUDICE ([id number]) MARTEZ, WEIAND ([id number]) -------------------------------------------------------------------------------- [place] Details Patient Name: [name] Date of Service: 01/08/2020 9:00 AM Medical Record Number: [id number] Patient Account Number: [id number] Date of Birth/Sex: [birth year] (69 y.o. M) Treating RN: Army Melia Primary Care Dorlene Footman: Merrie Roof Other Clinician: Referring Venancio Chenier: Merrie Roof Treating Randale Carvalho/Extender: Melburn Hake, [name] Weeks in Treatment: 25 Active Inactive Abuse / Safety / Falls / Self Care Management Nursing Diagnoses: Potential for falls Goals: Patient will not experience any injury related to falls Date Initiated: 07/15/2019 Target Resolution Date:  10/18/2019 Goal Status: Active Interventions: Assess fall risk on admission and as needed Notes: Necrotic Tissue Nursing Diagnoses: Impaired tissue integrity related to necrotic/devitalized tissue Goals: Necrotic/devitalized tissue will be minimized in the wound bed Date Initiated: 07/15/2019 Target Resolution Date: 10/18/2019 Goal Status: Active Interventions: Provide education on necrotic tissue and debridement process Notes: Orientation to the Wound Care Program Nursing Diagnoses: Knowledge deficit related to the wound healing center program Goals: Patient/caregiver will verbalize understanding of the [place] Program Date Initiated: 07/15/2019 Target Resolution Date: 10/18/2019 Goal Status: Active Interventions: Provide education on orientation to the wound center Notes: Peripheral Neuropathy Nursing Diagnoses: Knowledge deficit related to disease process and management of peripheral neurovascular dysfunction Goals: Patient/caregiver will verbalize understanding of disease process and disease management Date Initiated: 07/15/2019 Target Resolution Date: 10/18/2019 Goal Status: Active PRINCE, COUEY ([id number]) Interventions: Assess signs and symptoms of neuropathy upon admission and as needed Provide education on Management of Neuropathy and Related Ulcers Notes: Wound/Skin Impairment Nursing Diagnoses: Impaired tissue integrity Goals: Ulcer/skin breakdown will heal within 14 weeks Date Initiated: 07/15/2019 Target Resolution Date: 10/18/2019 Goal Status: Active Interventions: Assess patient/caregiver ability to obtain necessary supplies Assess patient/caregiver ability to perform ulcer/skin care regimen upon admission and as needed Assess ulceration(s) every visit Notes: Electronic Signature(s) Signed: 01/08/2020 3:55:13 PM By: Army Melia Entered By: Army Melia on 01/08/2020 09:02:07 [name]  ([id number]) -------------------------------------------------------------------------------- Pain Assessment Details Patient Name: [name] Date of Service: 01/08/2020 9:00 AM Medical Record Number: [id number] Patient Account Number: [id number] Date of Birth/Sex: [birth year] (69 y.o. M) Treating RN: Army Melia Primary Care Niaya Hickok: Merrie Roof Other Clinician: Referring Branden Shallenberger: Merrie Roof Treating Yerlin Gasparyan/Extender: Melburn Hake, [name] Weeks in Treatment: 25 Active Problems Location of Pain Severity and Description of Pain Patient Has Paino No Site Locations Pain Management and Medication Current Pain Management: Electronic Signature(s) Signed: 01/08/2020 3:55:13 PM By: Army Melia Entered By: Army Melia on 01/08/2020 09:01:14 [name] ([id number]) -------------------------------------------------------------------------------- Patient/Caregiver Education Details Patient Name: [name] Date of Service: 01/08/2020 9:00 AM Medical Record Number: [id number] Patient Account Number: [id number] Date of Birth/Gender: Apr 12, 1951 (69 y.o. M) Treating RN: Army Melia Primary Care Physician: Merrie Roof Other Clinician: Referring Physician: Merrie Roof Treating Physician/Extender: Sharalyn Ink in Treatment: 25 Education Assessment Education Provided To: Patient Education Topics Provided Wound/Skin Impairment: Handouts: Caring for Your Ulcer Methods: Demonstration, Explain/Verbal Responses: State content correctly Electronic Signature(s) Signed: 01/08/2020 3:55:13 PM By: Army Melia Entered By: Army Melia on 01/08/2020 09:26:58 [name] ([id number]) -------------------------------------------------------------------------------- Wound Assessment Details Patient Name: Tobey Bride  W. Date of Service: 01/08/2020 9:00 AM Medical Record Number: [id number] Patient Account Number: [id number] Date of Birth/Sex: 1950/11/06 (69 y.o. M) Treating  RN: Army Melia Primary Care Jamaia Brum: Merrie Roof Other Clinician: Referring Janautica Netzley: Merrie Roof Treating Leelynn Whetsel/Extender: Melburn Hake, [name] Weeks in Treatment: 25 Wound Status Wound Number: 3 Primary Diabetic Wound/Ulcer of the Lower Extremity Etiology: Wound Location: Left Toe Great Wound Open Wounding Event: Gradually Appeared Status: Date Acquired: 06/19/2019 Comorbid Lymphedema, Arrhythmia, Hypertension, Type II Weeks Of Treatment: 25 History: Diabetes, End Stage Renal Disease, Rheumatoid Arthritis, Clustered Wound: No Osteoarthritis, Confinement Anxiety Photos Wound Measurements Length: (cm) 0.4 % Reduc Width: (cm) 0.7 % Reduc Depth: (cm) 0.2 Epithel Area: (cm) 0.22 Volume: (cm) 0.044 tion in Area: 91.9% tion in Volume: 83.8% ialization: Small (1-33%) Wound Description Classification: Grade 2 Foul O Wound Margin: Thickened Slough Exudate Amount: Medium Exudate Type: Serous Exudate Color: amber dor After Cleansing: No /Fibrino Yes Wound Bed Granulation Amount: Large (67-100%) Exposed Structure Granulation Quality: Pink Fascia Exposed: No Necrotic Amount: Small (1-33%) Fat Layer (Subcutaneous Tissue) Exposed: Yes Necrotic Quality: Adherent Slough Tendon Exposed: No Muscle Exposed: No Joint Exposed: No Bone Exposed: No Treatment Notes Wound #3 (Left Toe Great) Notes hblue, conform, offloading shoe Electronic Signature(s) AMARION, PORTELL ([id number]) Signed: 01/08/2020 3:55:13 PM By: Army Melia Entered By: Army Melia on 01/08/2020 09:01:48 [name] ([id number]) -------------------------------------------------------------------------------- [place] Details Patient Name: [name] Date of Service: 01/08/2020 9:00 AM Medical Record Number: [id number] Patient Account Number: [id number] Date of Birth/Sex: 1950-12-30 (69 y.o. M) Treating RN: Army Melia Primary Care Nayelis Bonito: Merrie Roof Other Clinician: Referring Mela Perham: Merrie Roof Treating Megin Consalvo/Extender: Melburn Hake, [name] Weeks in Treatment: 25 Vital Signs Time Taken: 08:51 Temperature (F): 98.0 Height (in): 76 Pulse (bpm): 73 Weight (lbs): 244 Respiratory Rate (breaths/min): 18 Body Mass Index (BMI): 29.7 Blood Pressure (mmHg): 185/86 Reference Range: 80 - 120 mg / dl Electronic Signature(s) Signed: 01/08/2020 11:35:27 AM By: Lorine Bears RCP, RRT, CHT Entered By: Lorine Bears on 01/08/2020 08:54:58

## 2020-01-09 DIAGNOSIS — D509 Iron deficiency anemia, unspecified: Secondary | ICD-10-CM | POA: Diagnosis not present

## 2020-01-09 DIAGNOSIS — N2581 Secondary hyperparathyroidism of renal origin: Secondary | ICD-10-CM | POA: Diagnosis not present

## 2020-01-09 DIAGNOSIS — D631 Anemia in chronic kidney disease: Secondary | ICD-10-CM | POA: Diagnosis not present

## 2020-01-09 DIAGNOSIS — N186 End stage renal disease: Secondary | ICD-10-CM | POA: Diagnosis not present

## 2020-01-09 DIAGNOSIS — Z992 Dependence on renal dialysis: Secondary | ICD-10-CM | POA: Diagnosis not present

## 2020-01-12 DIAGNOSIS — N2581 Secondary hyperparathyroidism of renal origin: Secondary | ICD-10-CM | POA: Diagnosis not present

## 2020-01-12 DIAGNOSIS — D509 Iron deficiency anemia, unspecified: Secondary | ICD-10-CM | POA: Diagnosis not present

## 2020-01-12 DIAGNOSIS — N186 End stage renal disease: Secondary | ICD-10-CM | POA: Diagnosis not present

## 2020-01-12 DIAGNOSIS — Z992 Dependence on renal dialysis: Secondary | ICD-10-CM | POA: Diagnosis not present

## 2020-01-12 DIAGNOSIS — D631 Anemia in chronic kidney disease: Secondary | ICD-10-CM | POA: Diagnosis not present

## 2020-01-14 DIAGNOSIS — D509 Iron deficiency anemia, unspecified: Secondary | ICD-10-CM | POA: Diagnosis not present

## 2020-01-14 DIAGNOSIS — D631 Anemia in chronic kidney disease: Secondary | ICD-10-CM | POA: Diagnosis not present

## 2020-01-14 DIAGNOSIS — N186 End stage renal disease: Secondary | ICD-10-CM | POA: Diagnosis not present

## 2020-01-14 DIAGNOSIS — Z992 Dependence on renal dialysis: Secondary | ICD-10-CM | POA: Diagnosis not present

## 2020-01-14 DIAGNOSIS — N2581 Secondary hyperparathyroidism of renal origin: Secondary | ICD-10-CM | POA: Diagnosis not present

## 2020-01-16 DIAGNOSIS — Z992 Dependence on renal dialysis: Secondary | ICD-10-CM | POA: Diagnosis not present

## 2020-01-16 DIAGNOSIS — N186 End stage renal disease: Secondary | ICD-10-CM | POA: Diagnosis not present

## 2020-01-16 DIAGNOSIS — D631 Anemia in chronic kidney disease: Secondary | ICD-10-CM | POA: Diagnosis not present

## 2020-01-16 DIAGNOSIS — N2581 Secondary hyperparathyroidism of renal origin: Secondary | ICD-10-CM | POA: Diagnosis not present

## 2020-01-16 DIAGNOSIS — D509 Iron deficiency anemia, unspecified: Secondary | ICD-10-CM | POA: Diagnosis not present

## 2020-01-19 DIAGNOSIS — D631 Anemia in chronic kidney disease: Secondary | ICD-10-CM | POA: Diagnosis not present

## 2020-01-19 DIAGNOSIS — N2581 Secondary hyperparathyroidism of renal origin: Secondary | ICD-10-CM | POA: Diagnosis not present

## 2020-01-19 DIAGNOSIS — D509 Iron deficiency anemia, unspecified: Secondary | ICD-10-CM | POA: Diagnosis not present

## 2020-01-19 DIAGNOSIS — Z992 Dependence on renal dialysis: Secondary | ICD-10-CM | POA: Diagnosis not present

## 2020-01-19 DIAGNOSIS — N186 End stage renal disease: Secondary | ICD-10-CM | POA: Diagnosis not present

## 2020-01-19 DIAGNOSIS — E119 Type 2 diabetes mellitus without complications: Secondary | ICD-10-CM | POA: Diagnosis not present

## 2020-01-21 DIAGNOSIS — D509 Iron deficiency anemia, unspecified: Secondary | ICD-10-CM | POA: Diagnosis not present

## 2020-01-21 DIAGNOSIS — D631 Anemia in chronic kidney disease: Secondary | ICD-10-CM | POA: Diagnosis not present

## 2020-01-21 DIAGNOSIS — Z992 Dependence on renal dialysis: Secondary | ICD-10-CM | POA: Diagnosis not present

## 2020-01-21 DIAGNOSIS — N186 End stage renal disease: Secondary | ICD-10-CM | POA: Diagnosis not present

## 2020-01-21 DIAGNOSIS — N2581 Secondary hyperparathyroidism of renal origin: Secondary | ICD-10-CM | POA: Diagnosis not present

## 2020-01-23 DIAGNOSIS — N2581 Secondary hyperparathyroidism of renal origin: Secondary | ICD-10-CM | POA: Diagnosis not present

## 2020-01-23 DIAGNOSIS — D631 Anemia in chronic kidney disease: Secondary | ICD-10-CM | POA: Diagnosis not present

## 2020-01-23 DIAGNOSIS — N186 End stage renal disease: Secondary | ICD-10-CM | POA: Diagnosis not present

## 2020-01-23 DIAGNOSIS — Z992 Dependence on renal dialysis: Secondary | ICD-10-CM | POA: Diagnosis not present

## 2020-01-23 DIAGNOSIS — D509 Iron deficiency anemia, unspecified: Secondary | ICD-10-CM | POA: Diagnosis not present

## 2020-01-26 DIAGNOSIS — Z992 Dependence on renal dialysis: Secondary | ICD-10-CM | POA: Diagnosis not present

## 2020-01-26 DIAGNOSIS — N186 End stage renal disease: Secondary | ICD-10-CM | POA: Diagnosis not present

## 2020-01-26 DIAGNOSIS — D509 Iron deficiency anemia, unspecified: Secondary | ICD-10-CM | POA: Diagnosis not present

## 2020-01-26 DIAGNOSIS — N2581 Secondary hyperparathyroidism of renal origin: Secondary | ICD-10-CM | POA: Diagnosis not present

## 2020-01-26 DIAGNOSIS — D631 Anemia in chronic kidney disease: Secondary | ICD-10-CM | POA: Diagnosis not present

## 2020-01-28 DIAGNOSIS — Z992 Dependence on renal dialysis: Secondary | ICD-10-CM | POA: Diagnosis not present

## 2020-01-28 DIAGNOSIS — D509 Iron deficiency anemia, unspecified: Secondary | ICD-10-CM | POA: Diagnosis not present

## 2020-01-28 DIAGNOSIS — D631 Anemia in chronic kidney disease: Secondary | ICD-10-CM | POA: Diagnosis not present

## 2020-01-28 DIAGNOSIS — N2581 Secondary hyperparathyroidism of renal origin: Secondary | ICD-10-CM | POA: Diagnosis not present

## 2020-01-28 DIAGNOSIS — N186 End stage renal disease: Secondary | ICD-10-CM | POA: Diagnosis not present

## 2020-01-29 ENCOUNTER — Other Ambulatory Visit: Payer: Self-pay

## 2020-01-29 ENCOUNTER — Encounter: Payer: Medicare Other | Admitting: Physician Assistant

## 2020-01-29 DIAGNOSIS — N186 End stage renal disease: Secondary | ICD-10-CM | POA: Diagnosis not present

## 2020-01-29 DIAGNOSIS — E11621 Type 2 diabetes mellitus with foot ulcer: Secondary | ICD-10-CM | POA: Diagnosis not present

## 2020-01-29 DIAGNOSIS — I120 Hypertensive chronic kidney disease with stage 5 chronic kidney disease or end stage renal disease: Secondary | ICD-10-CM | POA: Diagnosis not present

## 2020-01-29 DIAGNOSIS — F419 Anxiety disorder, unspecified: Secondary | ICD-10-CM | POA: Diagnosis not present

## 2020-01-29 DIAGNOSIS — L97522 Non-pressure chronic ulcer of other part of left foot with fat layer exposed: Secondary | ICD-10-CM | POA: Diagnosis not present

## 2020-01-29 DIAGNOSIS — E1122 Type 2 diabetes mellitus with diabetic chronic kidney disease: Secondary | ICD-10-CM | POA: Diagnosis not present

## 2020-01-29 DIAGNOSIS — M069 Rheumatoid arthritis, unspecified: Secondary | ICD-10-CM | POA: Diagnosis not present

## 2020-01-29 NOTE — Progress Notes (Addendum)
Timothy, Gibson ([id number]) Visit Report for 01/29/2020 Chief Complaint Document Details Patient Name: Timothy Gibson, Timothy Gibson Date of Service: 01/29/2020 9:00 AM Medical Record Number: [id number] Patient Account Number: [id number] Date of Birth/Sex: [birth year] (69 y.o. M) Treating RN: Army Melia Primary Care Provider: Merrie Roof Other Clinician: Referring Provider: Merrie Roof Treating Provider/Extender: Melburn Hake, Rodney Yera Weeks in Treatment: 28 Information Obtained from: Patient Chief Complaint Left Great Toe Electronic Signature(s) Signed: 01/29/2020 8:59:56 AM By: Worthy Keeler PA-C Entered By: Worthy Keeler on 01/29/2020 08:59:56 ROQUE, SCHILL ([id number]) -------------------------------------------------------------------------------- Debridement Details Patient Name: Timothy Gibson Date of Service: 01/29/2020 9:00 AM Medical Record Number: [id number] Patient Account Number: [id number] Date of Birth/Sex: [birth year] (69 y.o. M) Treating RN: Army Melia Primary Care Provider: Merrie Roof Other Clinician: Referring Provider: Merrie Roof Treating Provider/Extender: Melburn Hake, Niyati Heinke Weeks in Treatment: 28 Debridement Performed for Wound #3 Left Toe Great Assessment: Performed By: Physician [name], Lakevia Perris E., PA-C Debridement Type: Debridement Severity of Tissue Pre Debridement: Fat layer exposed Level of Consciousness (Pre- Awake and Alert procedure): Pre-procedure Verification/Time Out Yes - 09:10 Taken: Start Time: 09:12 Pain Control: Lidocaine Total Area Debrided (L x W): 0.2 (cm) x 0.5 (cm) = 0.1 (cm) Tissue and other material Viable, Non-Viable, Callus, Slough, Subcutaneous, Slough debrided: Level: Skin/Subcutaneous Tissue Debridement Description: Excisional Instrument: Curette Bleeding: Minimum Hemostasis Achieved: Pressure End Time: 09:15 Response to Treatment: Procedure was tolerated well Level of Consciousness (Post- Awake and  Alert procedure): Post Debridement Measurements of Total Wound Length: (cm) 0.5 Width: (cm) 1.2 Depth: (cm) 0.4 Volume: (cm) 0.188 Character of Wound/Ulcer Post Debridement: Stable Severity of Tissue Post Debridement: Fat layer exposed Post Procedure Diagnosis Same as Pre-procedure Electronic Signature(s) Signed: 01/29/2020 4:00:14 PM By: Army Melia Signed: 01/30/2020 2:35:58 PM By: Worthy Keeler PA-C Entered By: Army Melia on 01/29/2020 09:19:12 DARRILL, VREELAND ([id number]) -------------------------------------------------------------------------------- HPI Details Patient Name: Timothy Gibson Date of Service: 01/29/2020 9:00 AM Medical Record Number: [id number] Patient Account Number: [id number] Date of Birth/Sex: December 21, 1950 (69 y.o. M) Treating RN: Army Melia Primary Care Provider: Merrie Roof Other Clinician: Referring Provider: Merrie Roof Treating Provider/Extender: Melburn Hake, Lorrie Strauch Weeks in Treatment: 28 History of Present Illness HPI Description: 11/27/17 on evaluation today patient presents for initial evaluation concerning an issue that he has been having with his right great toe which began last Thursday 11/22/17. He has a history of diabetes mellitus type to which he has had for greater than 30 years currently he is on insulin. Subsequently he also has a history of hypertension. On physical exam inspection is also appears he may have some peripheral vascular disease with his ABI's being noncompressible bilaterally. He is on dialysis as well and this is on Monday, Wednesday, and Friday. Patient was hospitalized back in January/February 2019 although this was more for stomach/back pain though they never told me exactly what was going on. This is according to the patient. Subsequently the ulcer which is between the third and fourth toe when space of the right foot as well is on the plantar aspect of the fourth toe is due to him having cleaned his toes this morning  and he states that his finger which is large split the toe tissue in between causing the wounds that we currently see. With that being said he states this has happened before I explained I would definitely recommend that he not do this any longer. He can use a small soft washcloth to clean between the toes without causing trauma.  Subsequently the right great toe hatchery does show evidence of necrotic tissue present on the surface of the wound specifically there is callous and dead skin surrounding which is trapping fluid causing problems as far as the wound is concerned. The surface of the wound does show slough although due to his vascular flow I'm not going to sharply debride this today I think I will selectively debride away the necrotic skin as well is callous from around the surrounding so this will not continue to be a moisture issue. Nonetheless the patient has no pain he does have diabetic neuropathy. 12/06/17-He is here in follow-up evaluation for a right great toe ulcer. He is voicing no complaints or concerns. He continues to infrequently/socially smokes cigars with no desire to quit. He has been compliant with offloading, using open toed surgical shoe; has been compliant using Santyl daily. He continues on clindamycin although takes it inconsistently secondary to indigestion. The plain film x-ray performed on 5/23 impression: Soft tissue swelling of the left first toe and is noted with probable irregular lucency involving distal tuft of first distal phalanx concerning for osteomyelitis. MRI may be performed for further evaluation; MRI ordered. The vascular evaluation performed on 5/24 field non- compressible ABIs bilaterally and reduced TBI bilaterally suggesting significant tibial disease. He will be referred to vascular medicine for further evaluation. 12/13/17-He is here in follow-up evaluation for right great toe ulcer. He has appointment next Thursday for the MRI and vascular evaluation.  Wound culture obtained last week grew abundant Klebsiella oxytoca (multidrug sensitivity), and abundant enteric coccus faecalis (sensitive to ampicillin). Ampicillin and Cipro were called in, along with a probiotic. In light of his appointments next Thursday he will follow-up in 2 weeks, we will continue with Santyl 12/27/17-He is here for evaluation for a right great toe ulcer. 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 He did have evaluation by vascular medicine, Dr. Ronalee Belts, on 6/13, at that appointment it was decided for him to undergo angiography with possible intervention but he refused and is still considering. If he chooses to have it done he will contact the office. He has not heard back from either ID offices that he was referred to two weeks ago: [place] and [place]. there is improvement in both appearance and measurement to the ulcer. We will extend antibiotic therapy (amoxicillin 500 every 12, Cipro 500 daily) for additional 2 weeks pending infectious disease consult, he will continue with Santyl daily. He was reminded to continue with probiotic therapy while on oral antibiotics; he denies any GI disturbance. 01/03/18-He is here in follow-up evaluation for right great toe ulcer. He is decided to not undergo any vascular intervention at this time. He was established with [facility] infectious disease (Dr. Linus Salmons) last week initiated on vancomycin and cefazolin with dialysis treatment for 6 weeks. We will continue with current treatment plan with Santyl and offloading and he will follow-up in 2 weeks 01/17/18-He is seen in follow-up evaluation for right great toe ulcer. There is improvement in appearance with less nonviable tissue present. We will continue with same treatment plan he will follow-up next week. He is tolerating IV antibiotics without any complications 02/15/97-PJ is seen in follow up evaluation for  right great toe ulcer. There continues to be improvement. He is tolerating IV antibiotics with hemodialysis, completion date of 7/31. We will transition to collagen and and follow-up next week 02/07/18-He is here in follow up for a  right great toe ulcer. There is red granulation tissue throughout the wound, improved in appearance. He completed antibiotics yesterday. He saw podiatry last Thursday for nail trimming, at that appointment he periwound callus was trimmed and a culture was obtained; culture negative. He does not have a follow up appointment with infectious disease. We will submit for grafix. 02/14/18-He is here in follow-up evaluation for a right great toe ulcer. There is slow improvement, red granulation tissue throughout. The insurance approval for grafix is pending. We will continue with collagen and he'll follow-up next week 02/21/18-He is seen in follow-up evaluation for right great toe ulcer. There is improvement with red granulation tissue throughout. He was approved for oasis and this was placed today. He will follow up next week 02/28/18-He is seen in follow-up evaluation for right great toe ulcer. The wound is stable, oasis applied and he'll follow-up next week 03/07/18-He is seen in follow-up violation of her right great toe ulcer. The wound is dry, no evidence of drainage. The wound appears healed today. He was painted with Betadine, instructed to paint with Betadine daily, cover with band-aid for the next week and then cover with band-aid for the following week continue with surgical shoe. He was encouraged to contact the clinic with any evidence of drainage, or change in appearance to toe/wound. He will be discharged from wound care services Readmission: 07/14/18 on evaluation today patient presents for initial evaluation our clinic concerning issues that he is having with his left great toe. We previously saw him in 2019 for his right great toe which was actually worse at that time  and subsequently was able to be completely healed in the end although it did take several months. With that being said the patient is currently having an area on his left great toe that occurred initially as result of a blister that came up he's not really sure why or how. Subsequently this was initially treated by his podiatrist though the recommendation there was apparently amputation according to the patient and his daughter who was present with him today. With that being said the patient did not want to proceed with amputation and would like to try to perform wound care and get this to heal without having to go down that road. We were able to do that with his other toe and so he would like to at least give that a try before going forward with amputation. Again I completely understand his concern in this regard. With that being said I did discuss with the patient obviously that it's always a chance that we cannot get this to heal with normal wound care measures but obviously we will give it our best shot. He does actually have an appointment scheduled for later today with the vascular specialist in order to evaluate his blood flow. That was at the recommendation of his podiatrist as well. Obviously if he is having good arterial flow and nonetheless is still experiencing issues with having the wound delay in healing then it may simply be a matter of we need to initiate more aggressive wound to therapy in order to see if we can get this to heal. No fevers, chills, DANZIG, MACGREGOR. ([id number]) nausea, or vomiting noted at this time. The patient notes that this woman has been present for approximately three weeks. He is not a smoker. His most recent hemoglobin A1c was 6.2 on 06/18/19. 07/17/2019 on evaluation today patient appears unfortunately to not be doing as well. He did see vascular  I did review the note as well. That was on 07/15/2019. Subsequently there can actually be taking him to the OR for an  angiogram due to poor arterial flow into this extremity. Apparently the atherosclerotic changes were severe. The patient is stated to be at risk for limb loss. Nonetheless the good news is this is being scheduled fairly quickly he will have the surgery/procedure next Thursday. With that being said I am going also go ahead based on the results of his x-ray which showed some chance of osteomyelitis in the distal portion of the toe get the patient set up for an MRI to further evaluate for the possibility of osteomyelitis obviously if he does have osteomyelitis we need to know so that we can address this appropriately. 07/29/2019 upon evaluation today patient appears to be doing better in general with regard to his toe ulcer. He does have much better blood flow you can actually feel a palpable pulse which appears to be very strong at this time. I did review his arterial intervention/angiogram which did show that he had evidence of sufficient arterial flow restoration at this point. He did have occlusions that were 60 and 80% that residually were only 10 and 25% with no limiting flow. This is excellent news and hopefully he will continue to show signs of improvement in light of the improved vascular status. With that being said the toe mainly shows somewhat of an eschar today I think that this is fairly stable and as such probably would be best not to really do much in the way of debridement at this point I do believe Betadine would likely be a good option. 08/07/2019 on evaluation today patient actually appears to be doing about the same. The one difference is the eschar that we were just seeing if we can maintain is actually starting to loosen up and leak around the edges I think it is time to actually go ahead and remove this today. Is also no longer stable is very soft. Nonetheless the patient is okay with proceeding with debridement at this point. Fortunately there is no signs of active infection. No  fevers, chills, nausea, vomiting, or diarrhea. The patient states that he took his last antibiotic that he had last night. He would need a refill as he will not be seeing infectious disease until February 2. Nonetheless obviously I do not want him to have any break in therapy especially when he is doing as well as he is currently. 08/14/2019 on evaluation today patient appears to be doing quite well all things considered in regard to his toe. I do feel like he is making progress and though this is not completely healed by any means we still have some ways to go I feel like he is making progress which is excellent. There is no sign of active infection at this time systemically. He does have osteomyelitis of the distal phalanx of his great toe on the left. He did see Dr. Linus Salmons. That is the infectious disease doctor in [place] where he was referred. Dr. Linus Salmons apparently according to the note did not feel like the patient's MRI revealed osteomyelitis but just reactive marrow and subsequently did not recommend any antibiotic therapy whatsoever. To be peripherally honest I am not really in agreement with this based on the MRI results and what we are seeing and I think he has been responding at least decently well to oral medication as well. Nonetheless we may want to check into the possibility of  IV antibiotics being administered at the dialysis center. I will discuss this with Dr. Dellia Nims further. 08/21/2019 upon evaluation today patient appears at this point to be making some progress with regard to his wounds. He has been tolerating the dressing changes without complication. Fortunately there is no evidence of active infection at this time. Overall I feel like he is doing well with the oral antibiotics I discussed with Dr. Dellia Nims the possibility of doing IV antibiotic therapy versus continuing with the oral. His opinion was if the patient is doing well with oral to maybe stick with that. Obviously we can  initiate additional Cipro as well just to help ensure that there is nothing worsening here. The Cipro should be beneficial for him as well. That helps cover more gram-negative's were is at the doxycycline is more gram- positive's. 09/02/2019 on evaluation today patient appears to be doing fairly well upon inspection today. With that being said though the patient is continuing to do well he does also continue to develop issues with buildup of necrotic tissue on the distal portion of his toe he does have good arterial flow however. For that reason I am going to suggest that what we may need to do is consider using something such as Santyl to try to keep this free and clear. Also discussed in greater detail HBO therapy with him today please see plan for additional recommendations and results of this discussion. 09/11/2019 upon evaluation today patient appears to be doing well with regard to his toe ulcer. This actually appears to be much better today using the Santyl than it was previous. Fortunately there is no signs of active infection. No fevers, chills, nausea, vomiting, or diarrhea. 09/18/2019 upon evaluation today patient appears to be doing much better in regard to his wound in general. The Santyl was doing a good job at loosening stuff up here for [place] which is excellent news. With that being said he is still taking the oral antibiotic. I sent this in last for him on 09/03/2019. This was the doxycycline. When this runs out he will actually be complete as far as the treatment is concerned. With that being said I am getting continue the Cipro for 1 additional month. Overall his wound seems to be doing quite well. 09/25/2019 upon evaluation today patient appears to be doing well with regard to his toe ulcer. This is measuring much better and overall seems to be making good progress. Fortunately there is no signs of active infection at this time. No fevers, chills, nausea, vomiting, or diarrhea. 10/02/2019  upon evaluation today patient appears to be doing well with regard to his wound. He has been tolerating the dressing changes without complication. Fortunately there is no signs of active infection at this time. No fevers, chills, nausea, vomiting, or diarrhea. Overall I am extremely pleased with how things seem to be progressing. I think the wound bed is at the point now where we can likely proceed with additional measures to try to get this healed more quickly I think Dermagraft may be a good possibility for the patient. We can definitely look into seeing about approval for this. 10/09/2019 upon evaluation today patient's wound actually appears to be doing well although it still is progressing somewhat slowly compared to what I would like to see. With that being said I did order Apligraf for the patient which I felt would keep the wound nice and moist without allowing it to dry out and hopefully allow this to heal much  more effectively and quickly. The patient actually did obtain approval and therefore we can apply that today. 10/23/2019 upon evaluation today patient's wound actually appears to be doing significantly better with regard to his toe ulcer. The tissue at the base of the wound is greatly improved and overall I am very pleased with the progress made. We did use Apligraf 2 weeks ago on the toe that did excellent for him much it has completely dissolved after as of the 2 weeks. They only had to change the dressing a few times due to drainage through. Obviously they left the Steri-Strips and Mepitel in place. Unfortunately we do not have the Apligraf available today for application regard therefore have to use a different product here in the clinic today and then order the Apligraf for next Thursday. That will be his second application. 4/22; patient arrived with overhanging skin and subcutaneous tissue on the wound margins this was removed. Apligraf #2 applied 11/13/2019 upon evaluation today  patient actually appears to be doing well with regard to his foot ulcer. He has been tolerating the dressing changes without complication. The overall wound bed appears to be healthier and has filled in as well as not nearly as deep as it was the tissue is also better quality. Overall I am extremely pleased with how things seem to be progressing. The patient is likewise very happy in this regard. 5/20; wound is measuring smaller had Apligraf #3 last time. Apligraf #4 today BRONDON, WANN ([id number]) 12/12/2019 upon evaluation today patient actually appears to be doing quite well with regard to his wound in my opinion. The ulcer is significantly smaller compared to last time I saw him although that has been several weeks. I do believe the Apligraf has been beneficial he has 1 remaining today this will be application #5. 10/16/8117 upon evaluation today patient appears to be doing well with regard to his toe ulcer. He is measuring about the same today but he has come a long way from where this started. Fortunately there is no evidence of active infection which is great news and overall I am very pleased with the progress that is been made. Nonetheless at this point we have completed the Apligraf course he did see improvement through this but I am going to switch to something different as of today. 01/08/2020 upon evaluation today patient appears to be doing much better in regards to his toe ulcer this is measuring smaller and overall very pleased with the progress. The Hydrofera Blue seems to be doing a great job. Fortunately there is no evidence of active infection at this time. No fevers, chills, nausea, vomiting, or diarrhea. 01/29/2020 upon evaluation today patient appears to be doing okay in regard to his toe ulcer. The main issue I see is that he actually has trouble right now with callus growing and covering over the wound bed. This prevents him from being able to get a dressing in contact with  the wound bed and subsequently fluid collection underneath. I think that we probably need to see him more frequently to keep this pared down and debrided to the point to allow the dressings to effectively heal this wound. I discussed that with the patient today. Electronic Signature(s) Signed: 01/29/2020 3:12:40 PM By: Worthy Keeler PA-C Entered By: Worthy Keeler on 01/29/2020 15:12:39 RONDAL, VANDEVELDE ([id number]) -------------------------------------------------------------------------------- Physical Exam Details Patient Name: Timothy Gibson Date of Service: 01/29/2020 9:00 AM Medical Record Number: [id number] Patient Account Number: [id number] Date of  Birth/Sex: March 09, 1951 (69 y.o. M) Treating RN: Army Melia Primary Care Provider: Merrie Roof Other Clinician: Referring Provider: Merrie Roof Treating Provider/Extender: [name], Reita Shindler Weeks in Treatment: 12 Constitutional Well-nourished and well-hydrated in no acute distress. Respiratory normal breathing without difficulty. Psychiatric this patient is able to make decisions and demonstrates good insight into disease process. Alert and Oriented x 3. pleasant and cooperative. Notes Upon inspection patient's wound bed actually showed signs of good granulation at this time there does not appear to be any evidence of active infection there was some slough noted I did clear this way along with the callus surrounding and was able to expose the underlying wound surface quite effectively. I think this will allow the patient to heal more effectively as well but will likely get a need to do this more regularly therefore I will plan to see him next week. Electronic Signature(s) Signed: 01/29/2020 3:13:07 PM By: Worthy Keeler PA-C Entered By: Worthy Keeler on 01/29/2020 15:13:06 Timothy Gibson ([id number]) -------------------------------------------------------------------------------- Physician Orders Details Patient Name:  Timothy Gibson Date of Service: 01/29/2020 9:00 AM Medical Record Number: [id number] Patient Account Number: [id number] Date of Birth/Sex: 1951-06-13 (69 y.o. M) Treating RN: Army Melia Primary Care Provider: Merrie Roof Other Clinician: Referring Provider: Merrie Roof Treating Provider/Extender: Melburn Hake, Kalle Bernath Weeks in Treatment: 73 Verbal / Phone Orders: No Diagnosis Coding ICD-10 Coding Code Description [phone] Other chronic osteomyelitis, left ankle and foot E11.621 Type 2 diabetes mellitus with foot ulcer L97.522 Non-pressure chronic ulcer of other part of left foot with fat layer exposed I10 Essential (primary) hypertension Wound Cleansing Wound #3 Left Toe Great o Dial antibacterial soap, wash wounds, rinse and pat dry prior to dressing wounds o May Shower, gently pat wound dry prior to applying new dressing. Anesthetic (add to Medication List) Wound #3 Left Toe Great o Topical Lidocaine 4% cream applied to wound bed prior to debridement (In Clinic Only). Primary Wound Dressing Wound #3 Left Toe Great o Hydrafera Blue Ready Transfer Secondary Dressing Wound #3 Left Toe Great o Conform/Kerlix Dressing Change Frequency Wound #3 Left Toe Great o Other: - As needed Follow-up Appointments Wound #3 Left Toe Great o Return Appointment in 1 week. Electronic Signature(s) Signed: 01/29/2020 4:00:14 PM By: Army Melia Signed: 01/30/2020 2:35:58 PM By: Worthy Keeler PA-C Entered By: Army Melia on 01/29/2020 09:19:54 NELDON, SHEPARD ([id number]) -------------------------------------------------------------------------------- Problem List Details Patient Name: Timothy Gibson Date of Service: 01/29/2020 9:00 AM Medical Record Number: [id number] Patient Account Number: [id number] Date of Birth/Sex: 03-20-1951 (69 y.o. M) Treating RN: Army Melia Primary Care Provider: Merrie Roof Other Clinician: Referring Provider: Merrie Roof Treating  Provider/Extender: Melburn Hake, Ceili Boshers Weeks in Treatment: 28 Active Problems ICD-10 Encounter Code Description Active Date MDM Diagnosis [phone] Other chronic osteomyelitis, left ankle and foot 07/29/2019 No Yes E11.621 Type 2 diabetes mellitus with foot ulcer 07/15/2019 No Yes L97.522 Non-pressure chronic ulcer of other part of left foot with fat layer 07/15/2019 No Yes exposed Garber (primary) hypertension 07/15/2019 No Yes Inactive Problems Resolved Problems Electronic Signature(s) Signed: 01/29/2020 8:59:42 AM By: Worthy Keeler PA-C Entered By: Worthy Keeler on 01/29/2020 08:59:41 Timothy Gibson ([id number]) -------------------------------------------------------------------------------- Progress Note Details Patient Name: Timothy Gibson Date of Service: 01/29/2020 9:00 AM Medical Record Number: [id number] Patient Account Number: [id number] Date of Birth/Sex: 1950/07/22 (69 y.o. M) Treating RN: Army Melia Primary Care Provider: Merrie Roof Other Clinician: Referring Provider: Merrie Roof Treating Provider/Extender: [name], Meris Reede Weeks in Treatment: 8  Subjective Chief Complaint Information obtained from Patient Left Great Toe History of Present Illness (HPI) 11/27/17 on evaluation today patient presents for initial evaluation concerning an issue that he has been having with his right great toe which began last Thursday 11/22/17. He has a history of diabetes mellitus type to which he has had for greater than 30 years currently he is on insulin. Subsequently he also has a history of hypertension. On physical exam inspection is also appears he may have some peripheral vascular disease with his ABI's being noncompressible bilaterally. He is on dialysis as well and this is on Monday, Wednesday, and Friday. Patient was hospitalized back in January/February 2019 although this was more for stomach/back pain though they never told me exactly what was going on. This  is according to the patient. Subsequently the ulcer which is between the third and fourth toe when space of the right foot as well is on the plantar aspect of the fourth toe is due to him having cleaned his toes this morning and he states that his finger which is large split the toe tissue in between causing the wounds that we currently see. With that being said he states this has happened before I explained I would definitely recommend that he not do this any longer. He can use a small soft washcloth to clean between the toes without causing trauma. Subsequently the right great toe hatchery does show evidence of necrotic tissue present on the surface of the wound specifically there is callous and dead skin surrounding which is trapping fluid causing problems as far as the wound is concerned. The surface of the wound does show slough although due to his vascular flow I'm not going to sharply debride this today I think I will selectively debride away the necrotic skin as well is callous from around the surrounding so this will not continue to be a moisture issue. Nonetheless the patient has no pain he does have diabetic neuropathy. 12/06/17-He is here in follow-up evaluation for a right great toe ulcer. He is voicing no complaints or concerns. He continues to infrequently/socially smokes cigars with no desire to quit. He has been compliant with offloading, using open toed surgical shoe; has been compliant using Santyl daily. He continues on clindamycin although takes it inconsistently secondary to indigestion. The plain film x-ray performed on 5/23 impression: Soft tissue swelling of the left first toe and is noted with probable irregular lucency involving distal tuft of first distal phalanx concerning for osteomyelitis. MRI may be performed for further evaluation; MRI ordered. The vascular evaluation performed on 5/24 field non- compressible ABIs bilaterally and reduced TBI bilaterally suggesting  significant tibial disease. He will be referred to vascular medicine for further evaluation. 12/13/17-He is here in follow-up evaluation for right great toe ulcer. He has appointment next Thursday for the MRI and vascular evaluation. Wound culture obtained last week grew abundant Klebsiella oxytoca (multidrug sensitivity), and abundant enteric coccus faecalis (sensitive to ampicillin). Ampicillin and Cipro were called in, along with a probiotic. In light of his appointments next Thursday he will follow-up in 2 weeks, we will continue with Santyl 12/27/17-He is here for evaluation for a right great toe ulcer. 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 He did have evaluation by vascular medicine, Dr. Ronalee Belts, on 6/13, at that appointment it was decided for him to undergo angiography with possible intervention but he refused and is still considering. If  he chooses to have it done he will contact the office. He has not heard back from either ID offices that he was referred to two weeks ago: [place] and [place]. there is improvement in both appearance and measurement to the ulcer. We will extend antibiotic therapy (amoxicillin 500 every 12, Cipro 500 daily) for additional 2 weeks pending infectious disease consult, he will continue with Santyl daily. He was reminded to continue with probiotic therapy while on oral antibiotics; he denies any GI disturbance. 01/03/18-He is here in follow-up evaluation for right great toe ulcer. He is decided to not undergo any vascular intervention at this time. He was established with [facility] infectious disease (Dr. Linus Salmons) last week initiated on vancomycin and cefazolin with dialysis treatment for 6 weeks. We will continue with current treatment plan with Santyl and offloading and he will follow-up in 2 weeks 01/17/18-He is seen in follow-up evaluation for right great toe ulcer. There is  improvement in appearance with less nonviable tissue present. We will continue with same treatment plan he will follow-up next week. He is tolerating IV antibiotics without any complications 08/08/84-[facility] is seen in follow up evaluation for right great toe ulcer. There continues to be improvement. He is tolerating IV antibiotics with hemodialysis, completion date of 7/31. We will transition to collagen and and follow-up next week 02/07/18-He is here in follow up for a right great toe ulcer. There is red granulation tissue throughout the wound, improved in appearance. He completed antibiotics yesterday. He saw podiatry last Thursday for nail trimming, at that appointment he periwound callus was trimmed and a culture was obtained; culture negative. He does not have a follow up appointment with infectious disease. We will submit for grafix. 02/14/18-He is here in follow-up evaluation for a right great toe ulcer. There is slow improvement, red granulation tissue throughout. The insurance approval for grafix is pending. We will continue with collagen and he'll follow-up next week 02/21/18-He is seen in follow-up evaluation for right great toe ulcer. There is improvement with red granulation tissue throughout. He was approved for oasis and this was placed today. He will follow up next week 02/28/18-He is seen in follow-up evaluation for right great toe ulcer. The wound is stable, oasis applied and he'll follow-up next week 03/07/18-He is seen in follow-up violation of her right great toe ulcer. The wound is dry, no evidence of drainage. The wound appears healed today. He was painted with Betadine, instructed to paint with Betadine daily, cover with band-aid for the next week and then cover with band-aid for the following week continue with surgical shoe. He was encouraged to contact the clinic with any evidence of drainage, or change in appearance to toe/wound. He will be discharged from wound care  services Readmission: 07/14/18 on evaluation today patient presents for initial evaluation our clinic concerning issues that he is having with his left great toe. We previously saw him in 2019 for his right great toe which was actually worse at that time and subsequently was able to be completely healed in the end although it did take several months. With that being said the patient is currently having an area on his left great toe that occurred initially as result of a blister that came up he's not really sure why or how. Subsequently this was initially treated by his podiatrist though the recommendation there was apparently amputation according to the patient and his daughter who was present with him today. With that being said the patient did not want  to proceed with amputation and would like to try to perform wound care and get this to heal without having to go down that road. We were able to do that with his other toe and so he would like to at least give that a try before going forward with amputation. JEANCARLOS, MARCHENA ([id number]) Again I completely understand his concern in this regard. With that being said I did discuss with the patient obviously that it's always a chance that we cannot get this to heal with normal wound care measures but obviously we will give it our best shot. He does actually have an appointment scheduled for later today with the vascular specialist in order to evaluate his blood flow. That was at the recommendation of his podiatrist as well. Obviously if he is having good arterial flow and nonetheless is still experiencing issues with having the wound delay in healing then it may simply be a matter of we need to initiate more aggressive wound to therapy in order to see if we can get this to heal. No fevers, chills, nausea, or vomiting noted at this time. The patient notes that this woman has been present for approximately three weeks. He is not a smoker. His most recent  hemoglobin A1c was 6.2 on 06/18/19. 07/17/2019 on evaluation today patient appears unfortunately to not be doing as well. He did see vascular I did review the note as well. That was on 07/15/2019. Subsequently there can actually be taking him to the OR for an angiogram due to poor arterial flow into this extremity. Apparently the atherosclerotic changes were severe. The patient is stated to be at risk for limb loss. Nonetheless the good news is this is being scheduled fairly quickly he will have the surgery/procedure next Thursday. With that being said I am going also go ahead based on the results of his x-ray which showed some chance of osteomyelitis in the distal portion of the toe get the patient set up for an MRI to further evaluate for the possibility of osteomyelitis obviously if he does have osteomyelitis we need to know so that we can address this appropriately. 07/29/2019 upon evaluation today patient appears to be doing better in general with regard to his toe ulcer. He does have much better blood flow you can actually feel a palpable pulse which appears to be very strong at this time. I did review his arterial intervention/angiogram which did show that he had evidence of sufficient arterial flow restoration at this point. He did have occlusions that were 60 and 80% that residually were only 10 and 25% with no limiting flow. This is excellent news and hopefully he will continue to show signs of improvement in light of the improved vascular status. With that being said the toe mainly shows somewhat of an eschar today I think that this is fairly stable and as such probably would be best not to really do much in the way of debridement at this point I do believe Betadine would likely be a good option. 08/07/2019 on evaluation today patient actually appears to be doing about the same. The one difference is the eschar that we were just seeing if we can maintain is actually starting to loosen up and leak  around the edges I think it is time to actually go ahead and remove this today. Is also no longer stable is very soft. Nonetheless the patient is okay with proceeding with debridement at this point. Fortunately there is no signs of active  infection. No fevers, chills, nausea, vomiting, or diarrhea. The patient states that he took his last antibiotic that he had last night. He would need a refill as he will not be seeing infectious disease until February 2. Nonetheless obviously I do not want him to have any break in therapy especially when he is doing as well as he is currently. 08/14/2019 on evaluation today patient appears to be doing quite well all things considered in regard to his toe. I do feel like he is making progress and though this is not completely healed by any means we still have some ways to go I feel like he is making progress which is excellent. There is no sign of active infection at this time systemically. He does have osteomyelitis of the distal phalanx of his great toe on the left. He did see Dr. Linus Salmons. That is the infectious disease doctor in [place] where he was referred. Dr. Linus Salmons apparently according to the note did not feel like the patient's MRI revealed osteomyelitis but just reactive marrow and subsequently did not recommend any antibiotic therapy whatsoever. To be peripherally honest I am not really in agreement with this based on the MRI results and what we are seeing and I think he has been responding at least decently well to oral medication as well. Nonetheless we may want to check into the possibility of IV antibiotics being administered at the dialysis center. I will discuss this with Dr. Dellia Nims further. 08/21/2019 upon evaluation today patient appears at this point to be making some progress with regard to his wounds. He has been tolerating the dressing changes without complication. Fortunately there is no evidence of active infection at this time. Overall I feel  like he is doing well with the oral antibiotics I discussed with Dr. Dellia Nims the possibility of doing IV antibiotic therapy versus continuing with the oral. His opinion was if the patient is doing well with oral to maybe stick with that. Obviously we can initiate additional Cipro as well just to help ensure that there is nothing worsening here. The Cipro should be beneficial for him as well. That helps cover more gram-negative's were is at the doxycycline is more gram- positive's. 09/02/2019 on evaluation today patient appears to be doing fairly well upon inspection today. With that being said though the patient is continuing to do well he does also continue to develop issues with buildup of necrotic tissue on the distal portion of his toe he does have good arterial flow however. For that reason I am going to suggest that what we may need to do is consider using something such as Santyl to try to keep this free and clear. Also discussed in greater detail HBO therapy with him today please see plan for additional recommendations and results of this discussion. 09/11/2019 upon evaluation today patient appears to be doing well with regard to his toe ulcer. This actually appears to be much better today using the Santyl than it was previous. Fortunately there is no signs of active infection. No fevers, chills, nausea, vomiting, or diarrhea. 09/18/2019 upon evaluation today patient appears to be doing much better in regard to his wound in general. The Santyl was doing a good job at loosening stuff up here for [place] which is excellent news. With that being said he is still taking the oral antibiotic. I sent this in last for him on 09/03/2019. This was the doxycycline. When this runs out he will actually be complete as far as the  treatment is concerned. With that being said I am getting continue the Cipro for 1 additional month. Overall his wound seems to be doing quite well. 09/25/2019 upon evaluation today patient  appears to be doing well with regard to his toe ulcer. This is measuring much better and overall seems to be making good progress. Fortunately there is no signs of active infection at this time. No fevers, chills, nausea, vomiting, or diarrhea. 10/02/2019 upon evaluation today patient appears to be doing well with regard to his wound. He has been tolerating the dressing changes without complication. Fortunately there is no signs of active infection at this time. No fevers, chills, nausea, vomiting, or diarrhea. Overall I am extremely pleased with how things seem to be progressing. I think the wound bed is at the point now where we can likely proceed with additional measures to try to get this healed more quickly I think Dermagraft may be a good possibility for the patient. We can definitely look into seeing about approval for this. 10/09/2019 upon evaluation today patient's wound actually appears to be doing well although it still is progressing somewhat slowly compared to what I would like to see. With that being said I did order Apligraf for the patient which I felt would keep the wound nice and moist without allowing it to dry out and hopefully allow this to heal much more effectively and quickly. The patient actually did obtain approval and therefore we can apply that today. 10/23/2019 upon evaluation today patient's wound actually appears to be doing significantly better with regard to his toe ulcer. The tissue at the base of the wound is greatly improved and overall I am very pleased with the progress made. We did use Apligraf 2 weeks ago on the toe that did excellent for him much it has completely dissolved after as of the 2 weeks. They only had to change the dressing a few times due to drainage through. Obviously they left the Steri-Strips and Mepitel in place. Unfortunately we do not have the Apligraf available today for application regard therefore have to use a different product here in the  clinic today and then order the Apligraf for next Thursday. That will be his second application. 4/22; patient arrived with overhanging skin and subcutaneous tissue on the wound margins this was removed. Apligraf #2 applied BRAXDON, GAPPA ([id number]) 11/13/2019 upon evaluation today patient actually appears to be doing well with regard to his foot ulcer. He has been tolerating the dressing changes without complication. The overall wound bed appears to be healthier and has filled in as well as not nearly as deep as it was the tissue is also better quality. Overall I am extremely pleased with how things seem to be progressing. The patient is likewise very happy in this regard. 5/20; wound is measuring smaller had Apligraf #3 last time. Apligraf #4 today 12/12/2019 upon evaluation today patient actually appears to be doing quite well with regard to his wound in my opinion. The ulcer is significantly smaller compared to last time I saw him although that has been several weeks. I do believe the Apligraf has been beneficial he has 1 remaining today this will be application #5. 6/81/1572 upon evaluation today patient appears to be doing well with regard to his toe ulcer. He is measuring about the same today but he has come a long way from where this started. Fortunately there is no evidence of active infection which is great news and overall I am very pleased  with the progress that is been made. Nonetheless at this point we have completed the Apligraf course he did see improvement through this but I am going to switch to something different as of today. 01/08/2020 upon evaluation today patient appears to be doing much better in regards to his toe ulcer this is measuring smaller and overall very pleased with the progress. The Hydrofera Blue seems to be doing a great job. Fortunately there is no evidence of active infection at this time. No fevers, chills, nausea, vomiting, or diarrhea. 01/29/2020 upon  evaluation today patient appears to be doing okay in regard to his toe ulcer. The main issue I see is that he actually has trouble right now with callus growing and covering over the wound bed. This prevents him from being able to get a dressing in contact with the wound bed and subsequently fluid collection underneath. I think that we probably need to see him more frequently to keep this pared down and debrided to the point to allow the dressings to effectively heal this wound. I discussed that with the patient today. Objective Constitutional Well-nourished and well-hydrated in no acute distress. Vitals Time Taken: 9:09 AM, Height: 76 in, Weight: 244 lbs, BMI: 29.7, Temperature: 98.2 F, Pulse: 75 bpm, Respiratory Rate: 16 breaths/min, Blood Pressure: 189/62 mmHg. Respiratory normal breathing without difficulty. Psychiatric this patient is able to make decisions and demonstrates good insight into disease process. Alert and Oriented x 3. pleasant and cooperative. General Notes: Upon inspection patient's wound bed actually showed signs of good granulation at this time there does not appear to be any evidence of active infection there was some slough noted I did clear this way along with the callus surrounding and was able to expose the underlying wound surface quite effectively. I think this will allow the patient to heal more effectively as well but will likely get a need to do this more regularly therefore I will plan to see him next week. Integumentary (Hair, Skin) Wound #3 status is Open. Original cause of wound was Gradually Appeared. The wound is located on the Left Toe Great. The wound measures 0.2cm length x 0.5cm width x 0.4cm depth; 0.079cm^2 area and 0.031cm^3 volume. There is Fat Layer (Subcutaneous Tissue) Exposed exposed. There is no tunneling noted, however, there is undermining starting at 12:00 and ending at 12:00 with a maximum distance of 0.8cm. There is a medium amount of  serous drainage noted. The wound margin is thickened. There is medium (34-66%) pink granulation within the wound bed. There is a medium (34-66%) amount of necrotic tissue within the wound bed including Adherent Slough. Assessment Active Problems ICD-10 Other chronic osteomyelitis, left ankle and foot Type 2 diabetes mellitus with foot ulcer Non-pressure chronic ulcer of other part of left foot with fat layer exposed Essential (primary) hypertension KODEN, HUNZEKER ([id number]) Procedures Wound #3 Pre-procedure diagnosis of Wound #3 is a Diabetic Wound/Ulcer of the Lower Extremity located on the Left Toe Great .Severity of Tissue Pre Debridement is: Fat layer exposed. There was a Excisional Skin/Subcutaneous Tissue Debridement with a total area of 0.1 sq cm performed by [name], Winnie Umali E., PA-C. With the following instrument(s): Curette to remove Viable and Non-Viable tissue/material. Material removed includes Callus, Subcutaneous Tissue, and Slough after achieving pain control using Lidocaine. A time out was conducted at 09:10, prior to the start of the procedure. A Minimum amount of bleeding was controlled with Pressure. The procedure was tolerated well. Post Debridement Measurements: 0.5cm length x 1.2cm  width x 0.4cm depth; 0.188cm^3 volume. Character of Wound/Ulcer Post Debridement is stable. Severity of Tissue Post Debridement is: Fat layer exposed. Post procedure Diagnosis Wound #3: Same as Pre-Procedure Plan Wound Cleansing: Wound #3 Left Toe Great: Dial antibacterial soap, wash wounds, rinse and pat dry prior to dressing wounds May Shower, gently pat wound dry prior to applying new dressing. Anesthetic (add to Medication List): Wound #3 Left Toe Great: Topical Lidocaine 4% cream applied to wound bed prior to debridement (In Clinic Only). Primary Wound Dressing: Wound #3 Left Toe Great: Hydrafera Blue Ready Transfer Secondary Dressing: Wound #3 Left Toe  Great: Conform/Kerlix Dressing Change Frequency: Wound #3 Left Toe Great: Other: - As needed Follow-up Appointments: Wound #3 Left Toe Great: Return Appointment in 1 week. 1. I would recommend that we continue with the [facility] that I will have more appropriate and regular serial debridements performed. 2. I am also can recommend that we continue to cover the area following the [facility] with a rolled gauze to secure in place as he seems to have done well in that regard. 3. He should also continue to use an offloading shoe to keep pressure off of the toe area we have discussed this in the past and he seems to do okay with that though he still gets some friction in the callus buildup. We will see patient back for reevaluation in 1 week here in the clinic. If anything worsens or changes patient will contact our office for additional recommendations. Electronic Signature(s) Signed: 01/29/2020 3:13:48 PM By: Worthy Keeler PA-C Entered By: Worthy Keeler on 01/29/2020 15:13:47 ELIE, LEPPO ([id number]) -------------------------------------------------------------------------------- SuperBill Details Patient Name: Timothy Gibson Date of Service: 01/29/2020 Medical Record Number: [id number] Patient Account Number: [id number] Date of Birth/Sex: Mar 18, 1951 (69 y.o. 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 I10 Essential (primary) hypertension Facility Procedures CPT4 Code: [id number] Description: 91916 - DEB SUBQ TISSUE 20 SQ CM/< Modifier: Quantity: 1 CPT4 Code: Description: ICD-10 Diagnosis  Description L97.522 Non-pressure chronic ulcer of other part of left foot with fat layer exp Modifier: osed Quantity: Physician Procedures CPT4 Code: 6060045 Description: 11042 - WC PHYS SUBQ TISS 20 SQ CM Modifier: Quantity: 1 CPT4 Code: Description: ICD-10 Diagnosis Description L97.522 Non-pressure chronic ulcer of other part of left foot with fat layer exp Modifier: osed Quantity: Electronic Signature(s) Signed: 01/29/2020 3:14:16 PM By: Worthy Keeler PA-C Entered By: Worthy Keeler on 01/29/2020 15:14:15

## 2020-01-29 NOTE — Progress Notes (Addendum)
CASHMERE, HARMES ([id number]) Visit Report for 01/29/2020 Arrival Information Details Patient Name: Timothy Gibson, Timothy Gibson Date of Service: 01/29/2020 9:00 AM Medical Record Number: [id number] Patient Account Number: [id number] Date of Birth/Sex: 1951/03/07 (69 y.o. M) Treating RN: Army Melia Primary Care Lestat Golob: Merrie Roof Other Clinician: Referring Rey Fors: Merrie Roof Treating Jamerius Boeckman/Extender: Melburn Hake, [name] Weeks in Treatment: 28 Visit Information History Since Last Visit Added or deleted any medications: No Patient Arrived: Ambulatory Any new allergies or adverse reactions: No Arrival Time: 09:08 Had a fall or experienced change in No Accompanied By: self activities of daily living that may affect Transfer Assistance: None risk of falls: Patient Requires Transmission-Based Precautions: No Signs or symptoms of abuse/neglect since last visito No Hospitalized since last visit: No Has Dressing in Place as Prescribed: Yes Pain Present Now: No Electronic Signature(s) Signed: 01/29/2020 4:00:14 PM By: Army Melia Entered By: Army Melia on 01/29/2020 09:09:03 Timothy Gibson ([id number]) -------------------------------------------------------------------------------- Encounter Discharge Information Details Patient Name: Timothy Gibson Date of Service: 01/29/2020 9:00 AM Medical Record Number: [id number] Patient Account Number: [id number] Date of Birth/Sex: [birth year] (69 y.o. M) Treating RN: Army Melia Primary Care Jaymar Loeber: Merrie Roof Other Clinician: Referring Kaliyah Gladman: Merrie Roof Treating Lenward Able/Extender: Melburn Hake, [name] Weeks in Treatment: 39 Encounter Discharge Information Items Post Procedure Vitals Discharge Condition: Stable Temperature (F): 98.2 Ambulatory Status: Ambulatory Pulse (bpm): 75 Discharge Destination: Home Respiratory Rate (breaths/min): 16 Transportation: Private Auto Blood Pressure (mmHg): 189/62 Accompanied By: self Schedule  Follow-up Appointment: No Clinical Summary of Care: Electronic Signature(s) Signed: 01/29/2020 4:00:14 PM By: Army Melia Entered By: Army Melia on 01/29/2020 09:21:09 Timothy Gibson ([id number]) -------------------------------------------------------------------------------- Lower Extremity Assessment Details Patient Name: Timothy Gibson Date of Service: 01/29/2020 9:00 AM Medical Record Number: [id number] Patient Account Number: [id number] Date of Birth/Sex: 1951/04/23 (69 y.o. M) Treating RN: Army Melia Primary Care Tianni Escamilla: Merrie Roof Other Clinician: Referring Electa Sterry: Merrie Roof Treating Skylen Danielsen/Extender: [name], [name] Weeks in Treatment: 28 Edema Assessment Assessed: [Left: No] [Right: No] Edema: [Left: N] [Right: o] Vascular Assessment Pulses: Dorsalis Pedis Palpable: [Left:Yes] Electronic Signature(s) Signed: 01/29/2020 4:00:14 PM By: Army Melia Entered By: Army Melia on 01/29/2020 09:10:46 Timothy Gibson ([id number]) -------------------------------------------------------------------------------- Multi Wound Chart Details Patient Name: Timothy Gibson Date of Service: 01/29/2020 9:00 AM Medical Record Number: [id number] Patient Account Number: [id number] Date of Birth/Sex: 1951-02-18 (69 y.o. 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 Comorbid History: Lymphedema, Arrhythmia, N/A N/A Hypertension, Type II Diabetes, End Stage Renal Disease, Rheumatoid Arthritis,  Osteoarthritis, Confinement Anxiety Date Acquired: 06/19/2019 N/A N/A Weeks of Treatment: 28 N/A N/A Wound Status: Open N/A N/A Measurements L x W x D (cm) 0.2x0.5x0.4 N/A N/A Area (cm) : 0.079 N/A N/A Volume (cm) : 0.031 N/A N/A % Reduction in Area: 97.10% N/A N/A % Reduction in Volume: 88.60% N/A N/A Starting Position 1 (o'clock): 12 Ending Position 1 (o'clock): 12 Maximum Distance 1 (cm): 0.8 Undermining: Yes N/A N/A Classification: Grade 2 N/A N/A Exudate Amount: Medium N/A N/A Exudate Type: Serous N/A N/A Exudate Color: amber N/A N/A Wound Margin: Thickened N/A N/A Granulation Amount: Medium (34-66%) N/A N/A Granulation Quality: Pink N/A  N/A Necrotic Amount: Medium (34-66%) N/A N/A Exposed Structures: Fat Layer (Subcutaneous Tissue) N/A N/A Exposed: Yes Fascia: No Tendon: No Muscle: No Joint: No Bone: No Epithelialization: Small (1-33%) N/A N/A Treatment Notes CHRISHAUN, SASSO ([id number]) Electronic Signature(s) Signed: 01/29/2020 4:00:14 PM By: Army Melia Entered By: Army Melia on 01/29/2020 09:13:00 ELI, PATTILLO ([id number]) -------------------------------------------------------------------------------- [place] Details Patient Name: Timothy Gibson Date of Service: 01/29/2020 9:00 AM Medical Record Number: [id number] Patient Account Number: [id number] Date of Birth/Sex: 1951-07-02 (69 y.o. M) Treating RN: Army Melia Primary Care Nasri Boakye: Merrie Roof Other Clinician: Referring Almetta Liddicoat: Merrie Roof Treating Dwyane Dupree/Extender: Melburn Hake, [name] Weeks in Treatment: 28 Active Inactive Abuse / Safety / Falls / Self Care Management Nursing Diagnoses: Potential for falls Goals: Patient will not experience any injury related to falls Date Initiated: 07/15/2019 Target Resolution Date: 10/18/2019 Goal Status: Active Interventions: Assess fall risk on admission and as needed Notes: Necrotic Tissue Nursing Diagnoses: Impaired  tissue integrity related to necrotic/devitalized tissue Goals: Necrotic/devitalized tissue will be minimized in the wound bed Date Initiated: 07/15/2019 Target Resolution Date: 10/18/2019 Goal Status: Active Interventions: Provide education on necrotic tissue and debridement process Notes: Orientation to the Wound Care Program Nursing Diagnoses: Knowledge deficit related to the wound healing center program Goals: Patient/caregiver will verbalize understanding of the [place] Program Date Initiated: 07/15/2019 Target Resolution Date: 10/18/2019 Goal Status: Active Interventions: Provide education on orientation to the wound center Notes: Peripheral Neuropathy Nursing Diagnoses: Knowledge deficit related to disease process and management of peripheral neurovascular dysfunction Goals: Patient/caregiver will verbalize understanding of disease process and disease management Date Initiated: 07/15/2019 Target Resolution Date: 10/18/2019 Goal Status: Active JARMAL, LEWELLING ([id number]) Interventions: Assess signs and symptoms of neuropathy upon admission and as needed Provide education on Management of Neuropathy and Related Ulcers Notes: Wound/Skin Impairment Nursing Diagnoses: Impaired tissue integrity Goals: Ulcer/skin breakdown will heal within 14 weeks Date Initiated: 07/15/2019 Target Resolution Date: 10/18/2019 Goal Status: Active Interventions: Assess patient/caregiver ability to obtain necessary supplies Assess patient/caregiver ability to perform ulcer/skin care regimen upon admission and as needed Assess ulceration(s) every visit Notes: Electronic Signature(s) Signed: 01/29/2020 4:00:14 PM By: Army Melia Entered By: Army Melia on 01/29/2020 09:12:50 Timothy Gibson ([id number]) -------------------------------------------------------------------------------- Pain Assessment Details Patient Name: Timothy Gibson Date of Service: 01/29/2020 9:00  AM Medical Record Number: [id number] Patient Account Number: [id number] Date of Birth/Sex: Apr 02, 1951 (69 y.o. M) Treating RN: Army Melia Primary Care Jennings Stirling: Merrie Roof Other Clinician: Referring Swayze Kozuch: Merrie Roof Treating Gimena Buick/Extender: Melburn Hake, [name] Weeks in Treatment: 28 Active Problems Location of Pain Severity and Description of Pain Patient Has Paino No Site Locations Pain Management and Medication Current Pain Management: Electronic Signature(s) Signed: 01/29/2020 4:00:14 PM By: Army Melia Entered By: Army Melia on 01/29/2020 09:09:22 Timothy Gibson ([id number]) -------------------------------------------------------------------------------- Patient/Caregiver Education Details Patient Name: Timothy Gibson Date of Service: 01/29/2020 9:00 AM Medical Record Number: [id number] Patient Account Number: [id number] Date of Birth/Gender: 06/24/1951 (70 y.o. M) Treating RN: Army Melia Primary Care Physician: Merrie Roof Other Clinician: Referring Physician: Merrie Roof Treating Physician/Extender: Sharalyn Ink in Treatment: 28 Education Assessment Education Provided To: Patient Education Topics Provided Wound/Skin Impairment: Handouts: Caring for Your Ulcer Methods: Demonstration, Explain/Verbal Responses: State content correctly Electronic Signature(s) Signed: 01/29/2020 4:00:14 PM By: Army Melia Entered By: Army Melia on 01/29/2020 09:20:25 COLMAN, BIRDWELL ([id number]) -------------------------------------------------------------------------------- Wound Assessment Details Patient Name: Timothy Gibson Date of Service: 01/29/2020 9:00 AM Medical Record Number: [id number] Patient Account Number:  [id number] Date of Birth/Sex: 05-11-1951 (69 y.o. M) Treating RN: Army Melia Primary Care Coben Godshall: Merrie Roof Other Clinician: Referring Valdez Brannan: Merrie Roof Treating Katherene Dinino/Extender: Melburn Hake, [name] Weeks in Treatment: 28 Wound  Status Wound Number: 3 Primary Diabetic Wound/Ulcer of the Lower Extremity Etiology: Wound Location: Left Toe Great Wound Open Wounding Event: Gradually Appeared Status: Date Acquired: 06/19/2019 Comorbid Lymphedema, Arrhythmia, Hypertension, Type II Weeks Of Treatment: 28 History: Diabetes, End Stage Renal Disease, Rheumatoid Arthritis, Clustered Wound: No Osteoarthritis, Confinement Anxiety Photos Wound Measurements Length: (cm) 0.2 % Redu Width: (cm) 0.5 % Redu Depth: (cm) 0.4 Epithe Area: (cm) 0.079 Tunne Volume: (cm) 0.031 Under Sta End Max ction in Area: 97.1% ction in Volume: 88.6% lialization: Small (1-33%) ling: No mining: Yes rting Position (o'clock): 12 ing Position (o'clock): 12 imum Distance: (cm) 0.8 Wound Description Classification: Grade 2 Foul O Wound Margin: Thickened Slough Exudate Amount: Medium Exudate Type: Serous Exudate Color: amber dor After Cleansing: No /Fibrino Yes Wound Bed Granulation Amount: Medium (34-66%) Exposed Structure Granulation Quality: Pink Fascia Exposed: No Necrotic Amount: Medium (34-66%) Fat Layer (Subcutaneous Tissue) Exposed: Yes Necrotic Quality: Adherent Slough Tendon Exposed: No Muscle Exposed: No Joint Exposed: No Bone Exposed: No Treatment Notes Wound #3 (Left [address], [place]. ([id number]) Notes hblue, conform, offloading shoe Electronic Signature(s) Signed: 01/29/2020 4:00:14 PM By: Army Melia Entered By: Army Melia on 01/29/2020 09:09:58 Timothy Gibson ([id number]) -------------------------------------------------------------------------------- Vitals Details Patient Name: Timothy Gibson Date of Service: 01/29/2020 9:00 AM Medical Record Number: [id number] Patient Account Number: [id number] Date of Birth/Sex: 09-21-1950 (69 y.o. M) Treating RN: Army Melia Primary Care Valynn Schamberger: Merrie Roof Other Clinician: Referring Kazia Grisanti: Merrie Roof Treating Ernest Popowski/Extender: Melburn Hake, [name] Weeks in Treatment: 28 Vital Signs Time Taken: 09:09 Temperature (F): 98.2 Height (in): 76 Pulse (bpm): 75 Weight (lbs): 244 Respiratory Rate (breaths/min): 16 Body Mass Index (BMI): 29.7 Blood Pressure (mmHg): 189/62 Reference Range: 80 - 120 mg / dl Electronic Signature(s) Signed: 01/29/2020 4:00:14 PM By: Army Melia Entered By: Army Melia on 01/29/2020 09:09:17

## 2020-01-30 DIAGNOSIS — D509 Iron deficiency anemia, unspecified: Secondary | ICD-10-CM | POA: Diagnosis not present

## 2020-01-30 DIAGNOSIS — D631 Anemia in chronic kidney disease: Secondary | ICD-10-CM | POA: Diagnosis not present

## 2020-01-30 DIAGNOSIS — Z992 Dependence on renal dialysis: Secondary | ICD-10-CM | POA: Diagnosis not present

## 2020-01-30 DIAGNOSIS — N186 End stage renal disease: Secondary | ICD-10-CM | POA: Diagnosis not present

## 2020-01-30 DIAGNOSIS — N2581 Secondary hyperparathyroidism of renal origin: Secondary | ICD-10-CM | POA: Diagnosis not present

## 2020-02-02 DIAGNOSIS — Z992 Dependence on renal dialysis: Secondary | ICD-10-CM | POA: Diagnosis not present

## 2020-02-02 DIAGNOSIS — D631 Anemia in chronic kidney disease: Secondary | ICD-10-CM | POA: Diagnosis not present

## 2020-02-02 DIAGNOSIS — D509 Iron deficiency anemia, unspecified: Secondary | ICD-10-CM | POA: Diagnosis not present

## 2020-02-02 DIAGNOSIS — N2581 Secondary hyperparathyroidism of renal origin: Secondary | ICD-10-CM | POA: Diagnosis not present

## 2020-02-02 DIAGNOSIS — N186 End stage renal disease: Secondary | ICD-10-CM | POA: Diagnosis not present

## 2020-02-04 DIAGNOSIS — D509 Iron deficiency anemia, unspecified: Secondary | ICD-10-CM | POA: Diagnosis not present

## 2020-02-04 DIAGNOSIS — D631 Anemia in chronic kidney disease: Secondary | ICD-10-CM | POA: Diagnosis not present

## 2020-02-04 DIAGNOSIS — N186 End stage renal disease: Secondary | ICD-10-CM | POA: Diagnosis not present

## 2020-02-04 DIAGNOSIS — Z992 Dependence on renal dialysis: Secondary | ICD-10-CM | POA: Diagnosis not present

## 2020-02-04 DIAGNOSIS — N2581 Secondary hyperparathyroidism of renal origin: Secondary | ICD-10-CM | POA: Diagnosis not present

## 2020-02-05 ENCOUNTER — Encounter: Payer: Medicare Other | Admitting: Physician Assistant

## 2020-02-05 DIAGNOSIS — I120 Hypertensive chronic kidney disease with stage 5 chronic kidney disease or end stage renal disease: Secondary | ICD-10-CM | POA: Diagnosis not present

## 2020-02-05 DIAGNOSIS — E11621 Type 2 diabetes mellitus with foot ulcer: Secondary | ICD-10-CM | POA: Diagnosis not present

## 2020-02-05 DIAGNOSIS — E1122 Type 2 diabetes mellitus with diabetic chronic kidney disease: Secondary | ICD-10-CM | POA: Diagnosis not present

## 2020-02-05 DIAGNOSIS — Z8249 Family history of ischemic heart disease and other diseases of the circulatory system: Secondary | ICD-10-CM | POA: Diagnosis not present

## 2020-02-05 DIAGNOSIS — L97522 Non-pressure chronic ulcer of other part of left foot with fat layer exposed: Secondary | ICD-10-CM | POA: Diagnosis not present

## 2020-02-05 DIAGNOSIS — N186 End stage renal disease: Secondary | ICD-10-CM | POA: Diagnosis not present

## 2020-02-05 DIAGNOSIS — F419 Anxiety disorder, unspecified: Secondary | ICD-10-CM | POA: Diagnosis not present

## 2020-02-05 DIAGNOSIS — I10 Essential (primary) hypertension: Secondary | ICD-10-CM | POA: Diagnosis not present

## 2020-02-05 DIAGNOSIS — M069 Rheumatoid arthritis, unspecified: Secondary | ICD-10-CM | POA: Diagnosis not present

## 2020-02-05 DIAGNOSIS — E7800 Pure hypercholesterolemia, unspecified: Secondary | ICD-10-CM | POA: Diagnosis not present

## 2020-02-05 NOTE — Progress Notes (Addendum)
Gibson, Timothy ([id number]) Visit Report for 02/05/2020 Chief Complaint Document Details Patient Name: Timothy, Gibson Date of Service: 02/05/2020 8:30 AM Medical Record Number: [id number] Patient Account Number: [id number] Date of Birth/Sex: September 03, 1950 (69 y.o. M) Treating RN: Cornell Barman Primary Care Provider: Merrie Roof Other Clinician: Referring Provider: Merrie Roof Treating Provider/Extender: Melburn Hake, Corona Popovich Weeks in Treatment: 78 Information Obtained from: Patient Chief Complaint Left Great Toe Electronic Signature(s) Signed: 02/05/2020 8:48:34 AM By: Worthy Keeler PA-C Entered By: Worthy Keeler on 02/05/2020 08:48:33 Timothy, Gibson ([id number]) -------------------------------------------------------------------------------- Debridement Details Patient Name: Timothy Gibson Date of Service: 02/05/2020 8:30 AM Medical Record Number: [id number] Patient Account Number: [id number] Date of Birth/Sex: [birth year] (69 y.o. M) Treating RN: Montey Hora Primary Care Provider: Merrie Roof Other Clinician: Referring Provider: Merrie Roof Treating Provider/Extender: Melburn Hake, Tatiyana Foucher Weeks in Treatment: 29 Debridement Performed for Wound #3 Left Toe Great Assessment: Performed By: Physician [name], Elon Lomeli E., PA-C Debridement Type: Debridement Severity of Tissue Pre Debridement: Fat layer exposed Level of Consciousness (Pre- Awake and Alert procedure): Pre-procedure Verification/Time Out Yes - 09:02 Taken: Start Time: 09:02 Pain Control: Lidocaine 4% Topical Solution Total Area Debrided (L x W): 0.4 (cm) x 0.8 (cm) = 0.32 (cm) Tissue and other material Viable, Non-Viable, Callus, Slough, Subcutaneous, Slough debrided: Level: Skin/Subcutaneous Tissue Debridement Description: Excisional Instrument: Curette Bleeding: Minimum Hemostasis Achieved: Pressure End Time: 09:06 Procedural Pain: 0 Post Procedural Pain: 0 Response to Treatment: Procedure was tolerated  well Level of Consciousness (Post- Awake and Alert procedure): Post Debridement Measurements of Total Wound Length: (cm) 0.4 Width: (cm) 0.9 Depth: (cm) 0.4 Volume: (cm) 0.113 Character of Wound/Ulcer Post Debridement: Improved Severity of Tissue Post Debridement: Fat layer exposed Post Procedure Diagnosis Same as Pre-procedure Electronic Signature(s) Signed: 02/05/2020 5:01:28 PM By: Worthy Keeler PA-C Signed: 02/09/2020 6:49:35 PM By: Montey Hora Entered By: Montey Hora on 02/05/2020 09:06:53 Timothy Gibson ([id number]) -------------------------------------------------------------------------------- HPI Details Patient Name: Timothy Gibson Date of Service: 02/05/2020 8:30 AM Medical Record Number: [id number] Patient Account Number: [id number] Date of Birth/Sex: 1951/01/11 (69 y.o. M) Treating RN: Cornell Barman Primary Care Provider: Merrie Roof Other Clinician: Referring Provider: Merrie Roof Treating Provider/Extender: Melburn Hake, Vondell Sowell Weeks in Treatment: 21 History of Present Illness HPI Description: 11/27/17 on evaluation today patient presents for initial evaluation concerning an issue that he has been having with his right great toe which began last Thursday 11/22/17. He has a history of diabetes mellitus type to which he has had for greater than 30 years currently he is on insulin. Subsequently he also has a history of hypertension. On physical exam inspection is also appears he may have some peripheral vascular disease with his ABI's being noncompressible bilaterally. He is on dialysis as well and this is on Monday, Wednesday, and Friday. Patient was hospitalized back in January/February 2019 although this was more for stomach/back pain though they never told me exactly what was going on. This is according to the patient. Subsequently the ulcer which is between the third and fourth toe when space of the right foot as well is on the plantar aspect of the fourth toe is  due to him having cleaned his toes this morning and he states that his finger which is large split the toe tissue in between causing the wounds that we currently see. With that being said he states this has happened before I explained I would definitely recommend that he not do this any longer. He can use a small  soft washcloth to clean between the toes without causing trauma. Subsequently the right great toe hatchery does show evidence of necrotic tissue present on the surface of the wound specifically there is callous and dead skin surrounding which is trapping fluid causing problems as far as the wound is concerned. The surface of the wound does show slough although due to his vascular flow I'm not going to sharply debride this today I think I will selectively debride away the necrotic skin as well is callous from around the surrounding so this will not continue to be a moisture issue. Nonetheless the patient has no pain he does have diabetic neuropathy. 12/06/17-He is here in follow-up evaluation for a right great toe ulcer. He is voicing no complaints or concerns. He continues to infrequently/socially smokes cigars with no desire to quit. He has been compliant with offloading, using open toed surgical shoe; has been compliant using Santyl daily. He continues on clindamycin although takes it inconsistently secondary to indigestion. The plain film x-ray performed on 5/23 impression: Soft tissue swelling of the left first toe and is noted with probable irregular lucency involving distal tuft of first distal phalanx concerning for osteomyelitis. MRI may be performed for further evaluation; MRI ordered. The vascular evaluation performed on 5/24 field non- compressible ABIs bilaterally and reduced TBI bilaterally suggesting significant tibial disease. He will be referred to vascular medicine for further evaluation. 12/13/17-He is here in follow-up evaluation for right great toe ulcer. He has appointment  next Thursday for the MRI and vascular evaluation. Wound culture obtained last week grew abundant Klebsiella oxytoca (multidrug sensitivity), and abundant enteric coccus faecalis (sensitive to ampicillin). Ampicillin and Cipro were called in, along with a probiotic. In light of his appointments next Thursday he will follow-up in 2 weeks, we will continue with Santyl 12/27/17-He is here for evaluation for a right great toe ulcer. 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 He did have evaluation by vascular medicine, Dr. Ronalee Belts, on 6/13, at that appointment it was decided for him to undergo angiography with possible intervention but he refused and is still considering. If he chooses to have it done he will contact the office. He has not heard back from either ID offices that he was referred to two weeks ago: [place] and [place]. there is improvement in both appearance and measurement to the ulcer. We will extend antibiotic therapy (amoxicillin 500 every 12, Cipro 500 daily) for additional 2 weeks pending infectious disease consult, he will continue with Santyl daily. He was reminded to continue with probiotic therapy while on oral antibiotics; he denies any GI disturbance. 01/03/18-He is here in follow-up evaluation for right great toe ulcer. He is decided to not undergo any vascular intervention at this time. He was established with [facility] infectious disease (Dr. Linus Salmons) last week initiated on vancomycin and cefazolin with dialysis treatment for 6 weeks. We will continue with current treatment plan with Santyl and offloading and he will follow-up in 2 weeks 01/17/18-He is seen in follow-up evaluation for right great toe ulcer. There is improvement in appearance with less nonviable tissue present. We will continue with same treatment plan he will follow-up next week. He is tolerating IV antibiotics without any  complications 07/28/12-NW is seen in follow up evaluation for right great toe ulcer. There continues to be improvement. He is tolerating IV antibiotics with hemodialysis, completion date of 7/31. We will transition to collagen and and follow-up  next week 02/07/18-He is here in follow up for a right great toe ulcer. There is red granulation tissue throughout the wound, improved in appearance. He completed antibiotics yesterday. He saw podiatry last Thursday for nail trimming, at that appointment he periwound callus was trimmed and a culture was obtained; culture negative. He does not have a follow up appointment with infectious disease. We will submit for grafix. 02/14/18-He is here in follow-up evaluation for a right great toe ulcer. There is slow improvement, red granulation tissue throughout. The insurance approval for grafix is pending. We will continue with collagen and he'll follow-up next week 02/21/18-He is seen in follow-up evaluation for right great toe ulcer. There is improvement with red granulation tissue throughout. He was approved for oasis and this was placed today. He will follow up next week 02/28/18-He is seen in follow-up evaluation for right great toe ulcer. The wound is stable, oasis applied and he'll follow-up next week 03/07/18-He is seen in follow-up violation of her right great toe ulcer. The wound is dry, no evidence of drainage. The wound appears healed today. He was painted with Betadine, instructed to paint with Betadine daily, cover with band-aid for the next week and then cover with band-aid for the following week continue with surgical shoe. He was encouraged to contact the clinic with any evidence of drainage, or change in appearance to toe/wound. He will be discharged from wound care services Readmission: 07/14/18 on evaluation today patient presents for initial evaluation our clinic concerning issues that he is having with his left great toe. We previously saw him in 2019  for his right great toe which was actually worse at that time and subsequently was able to be completely healed in the end although it did take several months. With that being said the patient is currently having an area on his left great toe that occurred initially as result of a blister that came up he's not really sure why or how. Subsequently this was initially treated by his podiatrist though the recommendation there was apparently amputation according to the patient and his daughter who was present with him today. With that being said the patient did not want to proceed with amputation and would like to try to perform wound care and get this to heal without having to go down that road. We were able to do that with his other toe and so he would like to at least give that a try before going forward with amputation. Again I completely understand his concern in this regard. With that being said I did discuss with the patient obviously that it's always a chance that we cannot get this to heal with normal wound care measures but obviously we will give it our best shot. He does actually have an appointment scheduled for later today with the vascular specialist in order to evaluate his blood flow. That was at the recommendation of his podiatrist as well. Obviously if he is having good arterial flow and nonetheless is still experiencing issues with having the wound delay in healing then it may simply be a matter of we need to initiate more aggressive wound to therapy in order to see if we can get this to heal. No fevers, chills, Timothy, Gibson. ([id number]) nausea, or vomiting noted at this time. The patient notes that this woman has been present for approximately three weeks. He is not a smoker. His most recent hemoglobin A1c was 6.2 on 06/18/19. 07/17/2019 on evaluation today patient appears unfortunately  to not be doing as well. He did see vascular I did review the note as well. That was on 07/15/2019.  Subsequently there can actually be taking him to the OR for an angiogram due to poor arterial flow into this extremity. Apparently the atherosclerotic changes were severe. The patient is stated to be at risk for limb loss. Nonetheless the good news is this is being scheduled fairly quickly he will have the surgery/procedure next Thursday. With that being said I am going also go ahead based on the results of his x-ray which showed some chance of osteomyelitis in the distal portion of the toe get the patient set up for an MRI to further evaluate for the possibility of osteomyelitis obviously if he does have osteomyelitis we need to know so that we can address this appropriately. 07/29/2019 upon evaluation today patient appears to be doing better in general with regard to his toe ulcer. He does have much better blood flow you can actually feel a palpable pulse which appears to be very strong at this time. I did review his arterial intervention/angiogram which did show that he had evidence of sufficient arterial flow restoration at this point. He did have occlusions that were 60 and 80% that residually were only 10 and 25% with no limiting flow. This is excellent news and hopefully he will continue to show signs of improvement in light of the improved vascular status. With that being said the toe mainly shows somewhat of an eschar today I think that this is fairly stable and as such probably would be best not to really do much in the way of debridement at this point I do believe Betadine would likely be a good option. 08/07/2019 on evaluation today patient actually appears to be doing about the same. The one difference is the eschar that we were just seeing if we can maintain is actually starting to loosen up and leak around the edges I think it is time to actually go ahead and remove this today. Is also no longer stable is very soft. Nonetheless the patient is okay with proceeding with debridement at this  point. Fortunately there is no signs of active infection. No fevers, chills, nausea, vomiting, or diarrhea. The patient states that he took his last antibiotic that he had last night. He would need a refill as he will not be seeing infectious disease until February 2. Nonetheless obviously I do not want him to have any break in therapy especially when he is doing as well as he is currently. 08/14/2019 on evaluation today patient appears to be doing quite well all things considered in regard to his toe. I do feel like he is making progress and though this is not completely healed by any means we still have some ways to go I feel like he is making progress which is excellent. There is no sign of active infection at this time systemically. He does have osteomyelitis of the distal phalanx of his great toe on the left. He did see Dr. Linus Salmons. That is the infectious disease doctor in [place] where he was referred. Dr. Linus Salmons apparently according to the note did not feel like the patient's MRI revealed osteomyelitis but just reactive marrow and subsequently did not recommend any antibiotic therapy whatsoever. To be peripherally honest I am not really in agreement with this based on the MRI results and what we are seeing and I think he has been responding at least decently well to oral medication as well.  Nonetheless we may want to check into the possibility of IV antibiotics being administered at the dialysis center. I will discuss this with Dr. Dellia Nims further. 08/21/2019 upon evaluation today patient appears at this point to be making some progress with regard to his wounds. He has been tolerating the dressing changes without complication. Fortunately there is no evidence of active infection at this time. Overall I feel like he is doing well with the oral antibiotics I discussed with Dr. Dellia Nims the possibility of doing IV antibiotic therapy versus continuing with the oral. His opinion was if the patient is  doing well with oral to maybe stick with that. Obviously we can initiate additional Cipro as well just to help ensure that there is nothing worsening here. The Cipro should be beneficial for him as well. That helps cover more gram-negative's were is at the doxycycline is more gram- positive's. 09/02/2019 on evaluation today patient appears to be doing fairly well upon inspection today. With that being said though the patient is continuing to do well he does also continue to develop issues with buildup of necrotic tissue on the distal portion of his toe he does have good arterial flow however. For that reason I am going to suggest that what we may need to do is consider using something such as Santyl to try to keep this free and clear. Also discussed in greater detail HBO therapy with him today please see plan for additional recommendations and results of this discussion. 09/11/2019 upon evaluation today patient appears to be doing well with regard to his toe ulcer. This actually appears to be much better today using the Santyl than it was previous. Fortunately there is no signs of active infection. No fevers, chills, nausea, vomiting, or diarrhea. 09/18/2019 upon evaluation today patient appears to be doing much better in regard to his wound in general. The Santyl was doing a good job at loosening stuff up here for [place] which is excellent news. With that being said he is still taking the oral antibiotic. I sent this in last for him on 09/03/2019. This was the doxycycline. When this runs out he will actually be complete as far as the treatment is concerned. With that being said I am getting continue the Cipro for 1 additional month. Overall his wound seems to be doing quite well. 09/25/2019 upon evaluation today patient appears to be doing well with regard to his toe ulcer. This is measuring much better and overall seems to be making good progress. Fortunately there is no signs of active infection at this  time. No fevers, chills, nausea, vomiting, or diarrhea. 10/02/2019 upon evaluation today patient appears to be doing well with regard to his wound. He has been tolerating the dressing changes without complication. Fortunately there is no signs of active infection at this time. No fevers, chills, nausea, vomiting, or diarrhea. Overall I am extremely pleased with how things seem to be progressing. I think the wound bed is at the point now where we can likely proceed with additional measures to try to get this healed more quickly I think Dermagraft may be a good possibility for the patient. We can definitely look into seeing about approval for this. 10/09/2019 upon evaluation today patient's wound actually appears to be doing well although it still is progressing somewhat slowly compared to what I would like to see. With that being said I did order Apligraf for the patient which I felt would keep the wound nice and moist without allowing it  to dry out and hopefully allow this to heal much more effectively and quickly. The patient actually did obtain approval and therefore we can apply that today. 10/23/2019 upon evaluation today patient's wound actually appears to be doing significantly better with regard to his toe ulcer. The tissue at the base of the wound is greatly improved and overall I am very pleased with the progress made. We did use Apligraf 2 weeks ago on the toe that did excellent for him much it has completely dissolved after as of the 2 weeks. They only had to change the dressing a few times due to drainage through. Obviously they left the Steri-Strips and Mepitel in place. Unfortunately we do not have the Apligraf available today for application regard therefore have to use a different product here in the clinic today and then order the Apligraf for next Thursday. That will be his second application. 4/22; patient arrived with overhanging skin and subcutaneous tissue on the wound margins this  was removed. Apligraf #2 applied 11/13/2019 upon evaluation today patient actually appears to be doing well with regard to his foot ulcer. He has been tolerating the dressing changes without complication. The overall wound bed appears to be healthier and has filled in as well as not nearly as deep as it was the tissue is also better quality. Overall I am extremely pleased with how things seem to be progressing. The patient is likewise very happy in this regard. 5/20; wound is measuring smaller had Apligraf #3 last time. Apligraf #4 today Timothy, Gibson ([id number]) 12/12/2019 upon evaluation today patient actually appears to be doing quite well with regard to his wound in my opinion. The ulcer is significantly smaller compared to last time I saw him although that has been several weeks. I do believe the Apligraf has been beneficial he has 1 remaining today this will be application #5. 1/44/8185 upon evaluation today patient appears to be doing well with regard to his toe ulcer. He is measuring about the same today but he has come a long way from where this started. Fortunately there is no evidence of active infection which is great news and overall I am very pleased with the progress that is been made. Nonetheless at this point we have completed the Apligraf course he did see improvement through this but I am going to switch to something different as of today. 01/08/2020 upon evaluation today patient appears to be doing much better in regards to his toe ulcer this is measuring smaller and overall very pleased with the progress. The Hydrofera Blue seems to be doing a great job. Fortunately there is no evidence of active infection at this time. No fevers, chills, nausea, vomiting, or diarrhea. 01/29/2020 upon evaluation today patient appears to be doing okay in regard to his toe ulcer. The main issue I see is that he actually has trouble right now with callus growing and covering over the wound bed. This  prevents him from being able to get a dressing in contact with the wound bed and subsequently fluid collection underneath. I think that we probably need to see him more frequently to keep this pared down and debrided to the point to allow the dressings to effectively heal this wound. I discussed that with the patient today. 02/05/2020 on evaluation today patient appears to be doing well with regard to his wounds currently. In fact he just has 1 on his toe remaining at this point he seems to be doing quite well in my  opinion. I am very pleased with the overall appearance today. There is no signs of active infection I do think keeping the callus trimmed down has been of great benefit as far as healing is concerned. I do not think we need to go 3 weeks out like we were in the past I believe that is been somewhat more detrimental than good for him. Electronic Signature(s) Signed: 02/05/2020 2:45:24 PM By: Worthy Keeler PA-C Entered By: Worthy Keeler on 02/05/2020 14:45:24 Timothy, Gibson ([id number]) -------------------------------------------------------------------------------- Physical Exam Details Patient Name: Timothy Gibson Date of Service: 02/05/2020 8:30 AM Medical Record Number: [id number] Patient Account Number: [id number] Date of Birth/Sex: October 04, 1950 (69 y.o. M) Treating RN: Cornell Barman Primary Care Provider: Merrie Roof Other Clinician: Referring Provider: Merrie Roof Treating Provider/Extender: Melburn Hake, Denay Pleitez Weeks in Treatment: 57 Constitutional Well-nourished and well-hydrated in no acute distress. Respiratory normal breathing without difficulty. Psychiatric this patient is able to make decisions and demonstrates good insight into disease process. Alert and Oriented x 3. pleasant and cooperative. Notes Upon inspection patient's wound bed actually showed signs of good granulation and epithelization at this point it did require some sharp debridement to clear away  minimal callus and some slough on the base of the wound he tolerated that today without complication post debridement wound bed appears to be doing much better which is great news. Electronic Signature(s) Signed: 02/05/2020 2:45:43 PM By: Worthy Keeler PA-C Entered By: Worthy Keeler on 02/05/2020 14:45:42 Timothy, Gibson ([id number]) -------------------------------------------------------------------------------- Physician Orders Details Patient Name: Timothy Gibson Date of Service: 02/05/2020 8:30 AM Medical Record Number: [id number] Patient Account Number: [id number] Date of Birth/Sex: 1951/07/06 (69 y.o. M) Treating RN: Montey Hora Primary Care Provider: Merrie Roof Other Clinician: Referring Provider: Merrie Roof Treating Provider/Extender: Melburn Hake, Ethel Veronica Weeks in Treatment: 40 Verbal / Phone Orders: No Diagnosis Coding ICD-10 Coding Code Description [phone] Other chronic osteomyelitis, left ankle and foot E11.621 Type 2 diabetes mellitus with foot ulcer L97.522 Non-pressure chronic ulcer of other part of left foot with fat layer exposed I10 Essential (primary) hypertension Wound Cleansing Wound #3 Left Toe Great o Dial antibacterial soap, wash wounds, rinse and pat dry prior to dressing wounds o May Shower, gently pat wound dry prior to applying new dressing. Anesthetic (add to Medication List) Wound #3 Left Toe Great o Topical Lidocaine 4% cream applied to wound bed prior to debridement (In Clinic Only). Primary Wound Dressing Wound #3 Left Toe Great o Hydrafera Blue Ready Transfer Secondary Dressing Wound #3 Left Toe Great o Conform/Kerlix Dressing Change Frequency Wound #3 Left Toe Great o Other: - As needed Follow-up Appointments Wound #3 Left Toe Great o Return Appointment in 1 week. Electronic Signature(s) Signed: 02/05/2020 5:01:28 PM By: Worthy Keeler PA-C Signed: 02/09/2020 6:49:35 PM By: Montey Hora Entered By: Montey Hora on  02/05/2020 09:07:33 Timothy, Gibson ([id number]) -------------------------------------------------------------------------------- Problem List Details Patient Name: Timothy Gibson Date of Service: 02/05/2020 8:30 AM Medical Record Number: [id number] Patient Account Number: [id number] Date of Birth/Sex: 04-May-1951 (69 y.o. M) Treating RN: Cornell Barman Primary Care Provider: Merrie Roof Other Clinician: Referring Provider: Merrie Roof Treating Provider/Extender: Melburn Hake, Samarth Ogle Weeks in Treatment: 29 Active Problems ICD-10 Encounter Code Description Active Date MDM Diagnosis [phone] Other chronic osteomyelitis, left ankle and foot 07/29/2019 No Yes E11.621 Type 2 diabetes mellitus with foot ulcer 07/15/2019 No Yes L97.522 Non-pressure chronic ulcer of other part of left foot with fat layer 07/15/2019 No Yes exposed [place] (primary)  hypertension 07/15/2019 No Yes Inactive Problems Resolved Problems Electronic Signature(s) Signed: 02/05/2020 8:48:27 AM By: Worthy Keeler PA-C Entered By: Worthy Keeler on 02/05/2020 08:48:27 Timothy, Gibson ([id number]) -------------------------------------------------------------------------------- Progress Note Details Patient Name: Timothy Gibson Date of Service: 02/05/2020 8:30 AM Medical Record Number: [id number] Patient Account Number: [id number] Date of Birth/Sex: 09/21/1950 (69 y.o. M) Treating RN: Cornell Barman Primary Care Provider: Merrie Roof Other Clinician: Referring Provider: Merrie Roof Treating Provider/Extender: Melburn Hake, Travious Vanover Weeks in Treatment: 25 Subjective Chief Complaint Information obtained from Patient Left Great Toe History of Present Illness (HPI) 11/27/17 on evaluation today patient presents for initial evaluation concerning an issue that he has been having with his right great toe which began last Thursday 11/22/17. He has a history of diabetes mellitus type to which he has had for greater than 30 years  currently he is on insulin. Subsequently he also has a history of hypertension. On physical exam inspection is also appears he may have some peripheral vascular disease with his ABI's being noncompressible bilaterally. He is on dialysis as well and this is on Monday, Wednesday, and Friday. Patient was hospitalized back in January/February 2019 although this was more for stomach/back pain though they never told me exactly what was going on. This is according to the patient. Subsequently the ulcer which is between the third and fourth toe when space of the right foot as well is on the plantar aspect of the fourth toe is due to him having cleaned his toes this morning and he states that his finger which is large split the toe tissue in between causing the wounds that we currently see. With that being said he states this has happened before I explained I would definitely recommend that he not do this any longer. He can use a small soft washcloth to clean between the toes without causing trauma. Subsequently the right great toe hatchery does show evidence of necrotic tissue present on the surface of the wound specifically there is callous and dead skin surrounding which is trapping fluid causing problems as far as the wound is concerned. The surface of the wound does show slough although due to his vascular flow I'm not going to sharply debride this today I think I will selectively debride away the necrotic skin as well is callous from around the surrounding so this will not continue to be a moisture issue. Nonetheless the patient has no pain he does have diabetic neuropathy. 12/06/17-He is here in follow-up evaluation for a right great toe ulcer. He is voicing no complaints or concerns. He continues to infrequently/socially smokes cigars with no desire to quit. He has been compliant with offloading, using open toed surgical shoe; has been compliant using Santyl daily. He continues on clindamycin although  takes it inconsistently secondary to indigestion. The plain film x-ray performed on 5/23 impression: Soft tissue swelling of the left first toe and is noted with probable irregular lucency involving distal tuft of first distal phalanx concerning for osteomyelitis. MRI may be performed for further evaluation; MRI ordered. The vascular evaluation performed on 5/24 field non- compressible ABIs bilaterally and reduced TBI bilaterally suggesting significant tibial disease. He will be referred to vascular medicine for further evaluation. 12/13/17-He is here in follow-up evaluation for right great toe ulcer. He has appointment next Thursday for the MRI and vascular evaluation. Wound culture obtained last week grew abundant Klebsiella oxytoca (multidrug sensitivity), and abundant enteric coccus faecalis (sensitive to ampicillin). Ampicillin and Cipro were called in, along  with a probiotic. In light of his appointments next Thursday he will follow-up in 2 weeks, we will continue with Santyl 12/27/17-He is here for evaluation for a right great toe ulcer. 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 He did have evaluation by vascular medicine, Dr. Ronalee Belts, on 6/13, at that appointment it was decided for him to undergo angiography with possible intervention but he refused and is still considering. If he chooses to have it done he will contact the office. He has not heard back from either ID offices that he was referred to two weeks ago: [place] and [place]. there is improvement in both appearance and measurement to the ulcer. We will extend antibiotic therapy (amoxicillin 500 every 12, Cipro 500 daily) for additional 2 weeks pending infectious disease consult, he will continue with Santyl daily. He was reminded to continue with probiotic therapy while on oral antibiotics; he denies any GI disturbance. 01/03/18-He is here in follow-up  evaluation for right great toe ulcer. He is decided to not undergo any vascular intervention at this time. He was established with [facility] infectious disease (Dr. Linus Salmons) last week initiated on vancomycin and cefazolin with dialysis treatment for 6 weeks. We will continue with current treatment plan with Santyl and offloading and he will follow-up in 2 weeks 01/17/18-He is seen in follow-up evaluation for right great toe ulcer. There is improvement in appearance with less nonviable tissue present. We will continue with same treatment plan he will follow-up next week. He is tolerating IV antibiotics without any complications 0/27/25-DG is seen in follow up evaluation for right great toe ulcer. There continues to be improvement. He is tolerating IV antibiotics with hemodialysis, completion date of 7/31. We will transition to collagen and and follow-up next week 02/07/18-He is here in follow up for a right great toe ulcer. There is red granulation tissue throughout the wound, improved in appearance. He completed antibiotics yesterday. He saw podiatry last Thursday for nail trimming, at that appointment he periwound callus was trimmed and a culture was obtained; culture negative. He does not have a follow up appointment with infectious disease. We will submit for grafix. 02/14/18-He is here in follow-up evaluation for a right great toe ulcer. There is slow improvement, red granulation tissue throughout. The insurance approval for grafix is pending. We will continue with collagen and he'll follow-up next week 02/21/18-He is seen in follow-up evaluation for right great toe ulcer. There is improvement with red granulation tissue throughout. He was approved for oasis and this was placed today. He will follow up next week 02/28/18-He is seen in follow-up evaluation for right great toe ulcer. The wound is stable, oasis applied and he'll follow-up next week 03/07/18-He is seen in follow-up violation of her right great  toe ulcer. The wound is dry, no evidence of drainage. The wound appears healed today. He was painted with Betadine, instructed to paint with Betadine daily, cover with band-aid for the next week and then cover with band-aid for the following week continue with surgical shoe. He was encouraged to contact the clinic with any evidence of drainage, or change in appearance to toe/wound. He will be discharged from wound care services Readmission: 07/14/18 on evaluation today patient presents for initial evaluation our clinic concerning issues that he is having with his left great toe. We previously saw him in 2019 for his right great toe which was actually worse at that time and subsequently was  able to be completely healed in the end although it did take several months. With that being said the patient is currently having an area on his left great toe that occurred initially as result of a blister that came up he's not really sure why or how. Subsequently this was initially treated by his podiatrist though the recommendation there was apparently amputation according to the patient and his daughter who was present with him today. With that being said the patient did not want to proceed with amputation and would like to try to perform wound care and get this to heal without having to go down that road. We were able to do that with his other toe and so he would like to at least give that a try before going forward with amputation. Timothy, Gibson ([id number]) Again I completely understand his concern in this regard. With that being said I did discuss with the patient obviously that it's always a chance that we cannot get this to heal with normal wound care measures but obviously we will give it our best shot. He does actually have an appointment scheduled for later today with the vascular specialist in order to evaluate his blood flow. That was at the recommendation of his podiatrist as well. Obviously if he  is having good arterial flow and nonetheless is still experiencing issues with having the wound delay in healing then it may simply be a matter of we need to initiate more aggressive wound to therapy in order to see if we can get this to heal. No fevers, chills, nausea, or vomiting noted at this time. The patient notes that this woman has been present for approximately three weeks. He is not a smoker. His most recent hemoglobin A1c was 6.2 on 06/18/19. 07/17/2019 on evaluation today patient appears unfortunately to not be doing as well. He did see vascular I did review the note as well. That was on 07/15/2019. Subsequently there can actually be taking him to the OR for an angiogram due to poor arterial flow into this extremity. Apparently the atherosclerotic changes were severe. The patient is stated to be at risk for limb loss. Nonetheless the good news is this is being scheduled fairly quickly he will have the surgery/procedure next Thursday. With that being said I am going also go ahead based on the results of his x-ray which showed some chance of osteomyelitis in the distal portion of the toe get the patient set up for an MRI to further evaluate for the possibility of osteomyelitis obviously if he does have osteomyelitis we need to know so that we can address this appropriately. 07/29/2019 upon evaluation today patient appears to be doing better in general with regard to his toe ulcer. He does have much better blood flow you can actually feel a palpable pulse which appears to be very strong at this time. I did review his arterial intervention/angiogram which did show that he had evidence of sufficient arterial flow restoration at this point. He did have occlusions that were 60 and 80% that residually were only 10 and 25% with no limiting flow. This is excellent news and hopefully he will continue to show signs of improvement in light of the improved vascular status. With that being said the toe mainly  shows somewhat of an eschar today I think that this is fairly stable and as such probably would be best not to really do much in the way of debridement at this point I do believe Betadine  would likely be a good option. 08/07/2019 on evaluation today patient actually appears to be doing about the same. The one difference is the eschar that we were just seeing if we can maintain is actually starting to loosen up and leak around the edges I think it is time to actually go ahead and remove this today. Is also no longer stable is very soft. Nonetheless the patient is okay with proceeding with debridement at this point. Fortunately there is no signs of active infection. No fevers, chills, nausea, vomiting, or diarrhea. The patient states that he took his last antibiotic that he had last night. He would need a refill as he will not be seeing infectious disease until February 2. Nonetheless obviously I do not want him to have any break in therapy especially when he is doing as well as he is currently. 08/14/2019 on evaluation today patient appears to be doing quite well all things considered in regard to his toe. I do feel like he is making progress and though this is not completely healed by any means we still have some ways to go I feel like he is making progress which is excellent. There is no sign of active infection at this time systemically. He does have osteomyelitis of the distal phalanx of his great toe on the left. He did see Dr. Linus Salmons. That is the infectious disease doctor in [place] where he was referred. Dr. Linus Salmons apparently according to the note did not feel like the patient's MRI revealed osteomyelitis but just reactive marrow and subsequently did not recommend any antibiotic therapy whatsoever. To be peripherally honest I am not really in agreement with this based on the MRI results and what we are seeing and I think he has been responding at least decently well to oral medication as well.  Nonetheless we may want to check into the possibility of IV antibiotics being administered at the dialysis center. I will discuss this with Dr. Dellia Nims further. 08/21/2019 upon evaluation today patient appears at this point to be making some progress with regard to his wounds. He has been tolerating the dressing changes without complication. Fortunately there is no evidence of active infection at this time. Overall I feel like he is doing well with the oral antibiotics I discussed with Dr. Dellia Nims the possibility of doing IV antibiotic therapy versus continuing with the oral. His opinion was if the patient is doing well with oral to maybe stick with that. Obviously we can initiate additional Cipro as well just to help ensure that there is nothing worsening here. The Cipro should be beneficial for him as well. That helps cover more gram-negative's were is at the doxycycline is more gram- positive's. 09/02/2019 on evaluation today patient appears to be doing fairly well upon inspection today. With that being said though the patient is continuing to do well he does also continue to develop issues with buildup of necrotic tissue on the distal portion of his toe he does have good arterial flow however. For that reason I am going to suggest that what we may need to do is consider using something such as Santyl to try to keep this free and clear. Also discussed in greater detail HBO therapy with him today please see plan for additional recommendations and results of this discussion. 09/11/2019 upon evaluation today patient appears to be doing well with regard to his toe ulcer. This actually appears to be much better today using the Santyl than it was previous. Fortunately there is  no signs of active infection. No fevers, chills, nausea, vomiting, or diarrhea. 09/18/2019 upon evaluation today patient appears to be doing much better in regard to his wound in general. The Santyl was doing a good job at loosening  stuff up here for [place] which is excellent news. With that being said he is still taking the oral antibiotic. I sent this in last for him on 09/03/2019. This was the doxycycline. When this runs out he will actually be complete as far as the treatment is concerned. With that being said I am getting continue the Cipro for 1 additional month. Overall his wound seems to be doing quite well. 09/25/2019 upon evaluation today patient appears to be doing well with regard to his toe ulcer. This is measuring much better and overall seems to be making good progress. Fortunately there is no signs of active infection at this time. No fevers, chills, nausea, vomiting, or diarrhea. 10/02/2019 upon evaluation today patient appears to be doing well with regard to his wound. He has been tolerating the dressing changes without complication. Fortunately there is no signs of active infection at this time. No fevers, chills, nausea, vomiting, or diarrhea. Overall I am extremely pleased with how things seem to be progressing. I think the wound bed is at the point now where we can likely proceed with additional measures to try to get this healed more quickly I think Dermagraft may be a good possibility for the patient. We can definitely look into seeing about approval for this. 10/09/2019 upon evaluation today patient's wound actually appears to be doing well although it still is progressing somewhat slowly compared to what I would like to see. With that being said I did order Apligraf for the patient which I felt would keep the wound nice and moist without allowing it to dry out and hopefully allow this to heal much more effectively and quickly. The patient actually did obtain approval and therefore we can apply that today. 10/23/2019 upon evaluation today patient's wound actually appears to be doing significantly better with regard to his toe ulcer. The tissue at the base of the wound is greatly improved and overall I am very  pleased with the progress made. We did use Apligraf 2 weeks ago on the toe that did excellent for him much it has completely dissolved after as of the 2 weeks. They only had to change the dressing a few times due to drainage through. Obviously they left the Steri-Strips and Mepitel in place. Unfortunately we do not have the Apligraf available today for application regard therefore have to use a different product here in the clinic today and then order the Apligraf for next Thursday. That will be his second application. 4/22; patient arrived with overhanging skin and subcutaneous tissue on the wound margins this was removed. Apligraf #2 applied Timothy, Gibson ([id number]) 11/13/2019 upon evaluation today patient actually appears to be doing well with regard to his foot ulcer. He has been tolerating the dressing changes without complication. The overall wound bed appears to be healthier and has filled in as well as not nearly as deep as it was the tissue is also better quality. Overall I am extremely pleased with how things seem to be progressing. The patient is likewise very happy in this regard. 5/20; wound is measuring smaller had Apligraf #3 last time. Apligraf #4 today 12/12/2019 upon evaluation today patient actually appears to be doing quite well with regard to his wound in my opinion. The ulcer is  significantly smaller compared to last time I saw him although that has been several weeks. I do believe the Apligraf has been beneficial he has 1 remaining today this will be application #5. 3/47/4259 upon evaluation today patient appears to be doing well with regard to his toe ulcer. He is measuring about the same today but he has come a long way from where this started. Fortunately there is no evidence of active infection which is great news and overall I am very pleased with the progress that is been made. Nonetheless at this point we have completed the Apligraf course he did see improvement through  this but I am going to switch to something different as of today. 01/08/2020 upon evaluation today patient appears to be doing much better in regards to his toe ulcer this is measuring smaller and overall very pleased with the progress. The Hydrofera Blue seems to be doing a great job. Fortunately there is no evidence of active infection at this time. No fevers, chills, nausea, vomiting, or diarrhea. 01/29/2020 upon evaluation today patient appears to be doing okay in regard to his toe ulcer. The main issue I see is that he actually has trouble right now with callus growing and covering over the wound bed. This prevents him from being able to get a dressing in contact with the wound bed and subsequently fluid collection underneath. I think that we probably need to see him more frequently to keep this pared down and debrided to the point to allow the dressings to effectively heal this wound. I discussed that with the patient today. 02/05/2020 on evaluation today patient appears to be doing well with regard to his wounds currently. In fact he just has 1 on his toe remaining at this point he seems to be doing quite well in my opinion. I am very pleased with the overall appearance today. There is no signs of active infection I do think keeping the callus trimmed down has been of great benefit as far as healing is concerned. I do not think we need to go 3 weeks out like we were in the past I believe that is been somewhat more detrimental than good for him. Objective Constitutional Well-nourished and well-hydrated in no acute distress. Vitals Time Taken: 8:30 AM, Height: 76 in, Weight: 244 lbs, BMI: 29.7, Temperature: 98.2 F, Pulse: 79 bpm, Respiratory Rate: 18 breaths/min, Blood Pressure: 173/69 mmHg. Respiratory normal breathing without difficulty. Psychiatric this patient is able to make decisions and demonstrates good insight into disease process. Alert and Oriented x 3. pleasant and  cooperative. General Notes: Upon inspection patient's wound bed actually showed signs of good granulation and epithelization at this point it did require some sharp debridement to clear away minimal callus and some slough on the base of the wound he tolerated that today without complication post debridement wound bed appears to be doing much better which is great news. Integumentary (Hair, Skin) Wound #3 status is Open. Original cause of wound was Gradually Appeared. The wound is located on the Left Toe Great. The wound measures 0.4cm length x 0.8cm width x 0.3cm depth; 0.251cm^2 area and 0.075cm^3 volume. There is Fat Layer (Subcutaneous Tissue) Exposed exposed. There is no tunneling or undermining noted. There is a medium amount of serous drainage noted. The wound margin is thickened. There is medium (34-66%) pink granulation within the wound bed. There is a medium (34-66%) amount of necrotic tissue within the wound bed including Adherent Slough. Assessment Active Problems ICD-10 Other chronic  osteomyelitis, left ankle and foot Type 2 diabetes mellitus with foot ulcer Non-pressure chronic ulcer of other part of left foot with fat layer exposed Essential (primary) hypertension Timothy, Gibson ([id number]) Procedures Wound #3 Pre-procedure diagnosis of Wound #3 is a Diabetic Wound/Ulcer of the Lower Extremity located on the Left Toe Great .Severity of Tissue Pre Debridement is: Fat layer exposed. There was a Excisional Skin/Subcutaneous Tissue Debridement with a total area of 0.32 sq cm performed by [name], Amanii Snethen E., PA-C. With the following instrument(s): Curette to remove Viable and Non-Viable tissue/material. Material removed includes Callus, Subcutaneous Tissue, and Slough after achieving pain control using Lidocaine 4% Topical Solution. No specimens were taken. A time out was conducted at 09:02, prior to the start of the procedure. A Minimum amount of bleeding was controlled with  Pressure. The procedure was tolerated well with a pain level of 0 throughout and a pain level of 0 following the procedure. Post Debridement Measurements: 0.4cm length x 0.9cm width x 0.4cm depth; 0.113cm^3 volume. Character of Wound/Ulcer Post Debridement is improved. Severity of Tissue Post Debridement is: Fat layer exposed. Post procedure Diagnosis Wound #3: Same as Pre-Procedure Plan Wound Cleansing: Wound #3 Left Toe Great: Dial antibacterial soap, wash wounds, rinse and pat dry prior to dressing wounds May Shower, gently pat wound dry prior to applying new dressing. Anesthetic (add to Medication List): Wound #3 Left Toe Great: Topical Lidocaine 4% cream applied to wound bed prior to debridement (In Clinic Only). Primary Wound Dressing: Wound #3 Left Toe Great: Hydrafera Blue Ready Transfer Secondary Dressing: Wound #3 Left Toe Great: Conform/Kerlix Dressing Change Frequency: Wound #3 Left Toe Great: Other: - As needed Follow-up Appointments: Wound #3 Left Toe Great: Return Appointment in 1 week. 1. I would recommend currently that we go ahead and continue with the wound care measures as before with the [facility] Blue small piece cut to fit inside the wound bed a second piece over top for protection and to catch any drainage. 2. I am also can recommend that we continue to change this every other day in order to keep the area nice and clean and dry as it does not become too macerated or cause any troubles. 3. We will also have him continue to monitor for any signs of infection though I do not see anything at this point I am hopeful that he will continue to show signs of good improvement as things progress. We will see patient back for reevaluation in 1 week here in the clinic. If anything worsens or changes patient will contact our office for additional recommendations. Electronic Signature(s) Signed: 02/05/2020 2:46:29 PM By: Worthy Keeler PA-C Entered By: Worthy Keeler on  02/05/2020 14:46:28 Timothy, Gibson ([id number]) -------------------------------------------------------------------------------- SuperBill Details Patient Name: Timothy Gibson Date of Service: 02/05/2020 Medical Record Number: [id number] Patient Account Number: [id number] Date of Birth/Sex: 1951-03-02 (69 y.o. 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 I10 Essential (primary) hypertension Facility Procedures CPT4 Code: [id number] Description: 16967 - DEB SUBQ TISSUE 20 SQ CM/< Modifier: Quantity: 1 CPT4 Code: Description: ICD-10 Diagnosis Description L97.522 Non-pressure chronic ulcer of other part of left foot with fat layer exp Modifier: osed Quantity:  Physician Procedures CPT4 Code: 7837542 Description: 37023 - WC PHYS SUBQ TISS 20 SQ CM Modifier: Quantity: 1 CPT4 Code: Description: ICD-10 Diagnosis Description L97.522 Non-pressure chronic ulcer of other part of left foot with fat layer exp Modifier: osed Quantity: Electronic Signature(s) Signed: 02/05/2020 2:46:40 PM By: Worthy Keeler PA-C Entered By: Worthy Keeler on 02/05/2020 14:46:38

## 2020-02-06 DIAGNOSIS — D509 Iron deficiency anemia, unspecified: Secondary | ICD-10-CM | POA: Diagnosis not present

## 2020-02-06 DIAGNOSIS — D631 Anemia in chronic kidney disease: Secondary | ICD-10-CM | POA: Diagnosis not present

## 2020-02-06 DIAGNOSIS — N186 End stage renal disease: Secondary | ICD-10-CM | POA: Diagnosis not present

## 2020-02-06 DIAGNOSIS — N2581 Secondary hyperparathyroidism of renal origin: Secondary | ICD-10-CM | POA: Diagnosis not present

## 2020-02-06 DIAGNOSIS — Z992 Dependence on renal dialysis: Secondary | ICD-10-CM | POA: Diagnosis not present

## 2020-02-07 DIAGNOSIS — N186 End stage renal disease: Secondary | ICD-10-CM | POA: Diagnosis not present

## 2020-02-07 DIAGNOSIS — Z992 Dependence on renal dialysis: Secondary | ICD-10-CM | POA: Diagnosis not present

## 2020-02-09 DIAGNOSIS — N2581 Secondary hyperparathyroidism of renal origin: Secondary | ICD-10-CM | POA: Diagnosis not present

## 2020-02-09 DIAGNOSIS — N186 End stage renal disease: Secondary | ICD-10-CM | POA: Diagnosis not present

## 2020-02-09 DIAGNOSIS — D509 Iron deficiency anemia, unspecified: Secondary | ICD-10-CM | POA: Diagnosis not present

## 2020-02-09 DIAGNOSIS — D631 Anemia in chronic kidney disease: Secondary | ICD-10-CM | POA: Diagnosis not present

## 2020-02-09 DIAGNOSIS — Z992 Dependence on renal dialysis: Secondary | ICD-10-CM | POA: Diagnosis not present

## 2020-02-10 NOTE — Progress Notes (Signed)
WILLAM, MUNFORD ([id number]) Visit Report for 02/05/2020 Arrival Information Details Patient Name: Timothy Gibson Date of Service: 02/05/2020 8:30 AM Medical Record Number: [id number] Patient Account Number: [id number] Date of Birth/Sex: April 17, 1951 (69 y.o. M) Treating RN: Cornell Barman Primary Care Nimah Uphoff: Merrie Roof Other Clinician: Referring Aleera Gilcrease: Merrie Roof Treating Keola Heninger/Extender: Melburn Hake, [name] Weeks in Treatment: 43 Visit Information History Since Last Visit All ordered tests and consults were completed: No Patient Arrived: Ambulatory Added or deleted any medications: No Arrival Time: 08:29 Any new allergies or adverse reactions: No Accompanied By: self Had a fall or experienced change in No Transfer Assistance: None activities of daily living that may affect Patient Identification Verified: Yes risk of falls: Secondary Verification Process Completed: Yes Signs or symptoms of abuse/neglect since last visito No Patient Requires Transmission-Based Precautions: No Hospitalized since last visit: No Patient Has Alerts: No Implantable device outside of the clinic excluding No cellular tissue based products placed in the center since last visit: Has Dressing in Place as Prescribed: Yes Has Compression in Place as Prescribed: No Has Footwear/Offloading in Place as Prescribed: Yes Left: Surgical Shoe with Pressure Relief Insole Pain Present Now: No Electronic Signature(s) Signed: 02/05/2020 4:44:30 PM By: Darci Needle Entered By: Darci Needle on 02/05/2020 08:31:48 Timothy Gibson ([id number]) -------------------------------------------------------------------------------- Encounter Discharge Information Details Patient Name: Timothy Gibson Date of Service: 02/05/2020 8:30 AM Medical Record Number: [id number] Patient Account Number: [id number] Date of Birth/Sex: 1951-06-21 (69 y.o. M) Treating RN: Montey Hora Primary Care Earma Nicolaou: Merrie Roof  Other Clinician: Referring Huan Pollok: Merrie Roof Treating Kashden Deboy/Extender: Melburn Hake, [name] Weeks in Treatment: 70 Encounter Discharge Information Items Post Procedure Vitals Discharge Condition: Stable Temperature (F): 98.2 Ambulatory Status: Cane Pulse (bpm): 79 Discharge Destination: Home Respiratory Rate (breaths/min): 16 Transportation: Private Auto Blood Pressure (mmHg): 173/69 Accompanied By: self Schedule Follow-up Appointment: Yes Clinical Summary of Care: Electronic Signature(s) Signed: 02/09/2020 6:49:35 PM By: Montey Hora Entered By: Montey Hora on 02/05/2020 09:08:50 Timothy Gibson ([id number]) -------------------------------------------------------------------------------- Lower Extremity Assessment Details Patient Name: Timothy Gibson Date of Service: 02/05/2020 8:30 AM Medical Record Number: [id number] Patient Account Number: [id number] Date of Birth/Sex: 01/28/1951 (69 y.o. M) Treating RN: Cornell Barman Primary Care Edrik Rundle: Merrie Roof Other Clinician: Referring Marvalene Barrett: Merrie Roof Treating Shatia Sindoni/Extender: Melburn Hake, [name] Weeks in Treatment: 29 Edema Assessment Assessed: [Left: Yes] [Right: No] Edema: [Left: Ye] [Right: s] Vascular Assessment Pulses: Dorsalis Pedis Palpable: [Left:Yes] Electronic Signature(s) Signed: 02/05/2020 4:44:30 PM By: Darci Needle Signed: 02/05/2020 6:25:55 PM By: Gretta Cool, BSN, RN, CWS, Kim RN, BSN Entered By: Darci Needle on 02/05/2020 08:42:32 Timothy Gibson ([id number]) -------------------------------------------------------------------------------- Multi Wound Chart Details Patient Name: Timothy Gibson Date of Service: 02/05/2020 8:30 AM Medical Record Number: [id number] Patient Account Number: [id number] Date of Birth/Sex: 1950/11/06 (69 y.o. 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 Comorbid History: Lymphedema, Arrhythmia, N/A N/A Hypertension, Type II Diabetes, End Stage Renal Disease, Rheumatoid Arthritis, Osteoarthritis, Confinement Anxiety Date Acquired: 06/19/2019 N/A N/A Weeks of Treatment: 29 N/A N/A Wound Status: Open N/A N/A Measurements L x W x D (cm) 0.4x0.8x0.3 N/A N/A Area (cm) : 0.251 N/A N/A Volume (  cm) : 0.075 N/A N/A % Reduction in Area: 90.70% N/A N/A % Reduction in Volume: 72.30% N/A N/A Classification: Grade 2 N/A N/A Exudate Amount: Medium N/A N/A Exudate Type: Serous N/A N/A Exudate Color: amber N/A N/A Wound Margin: Thickened N/A N/A Granulation Amount: Medium (34-66%) N/A N/A Granulation Quality: Pink N/A N/A Necrotic Amount: Medium (34-66%) N/A N/A Exposed Structures: Fat Layer (Subcutaneous Tissue) N/A N/A Exposed: Yes Fascia: No Tendon: No Muscle: No Joint: No Bone: No Epithelialization: Small (1-33%) N/A N/A Treatment Notes Electronic Signature(s) Signed: 02/09/2020 6:49:35 PM By: Montey Hora Entered By: Montey Hora on 02/05/2020 09:03:46 Timothy Gibson ([id number]) Timothy Gibson ([id number]) -------------------------------------------------------------------------------- [place] Details Patient Name: Timothy Gibson Date of Service: 02/05/2020 8:30 AM Medical Record Number: [id number] Patient Account Number: [id number] Date of Birth/Sex: [birth year] (69 y.o. M) Treating RN: Montey Hora Primary Care Janele Lague: Merrie Roof Other Clinician: Referring Lathyn Griggs: Merrie Roof Treating Maddyx Vallie/Extender: Melburn Hake,  [name] Weeks in Treatment: 39 Active Inactive Abuse / Safety / Falls / Self Care Management Nursing Diagnoses: Potential for falls Goals: Patient will not experience any injury related to falls Date Initiated: 07/15/2019 Target Resolution Date: 10/18/2019 Goal Status: Active Interventions: Assess fall risk on admission and as needed Notes: Necrotic Tissue Nursing Diagnoses: Impaired tissue integrity related to necrotic/devitalized tissue Goals: Necrotic/devitalized tissue will be minimized in the wound bed Date Initiated: 07/15/2019 Target Resolution Date: 10/18/2019 Goal Status: Active Interventions: Provide education on necrotic tissue and debridement process Notes: Orientation to the Wound Care Program Nursing Diagnoses: Knowledge deficit related to the wound healing center program Goals: Patient/caregiver will verbalize understanding of the [facility] Program Date Initiated: 07/15/2019 Target Resolution Date: 10/18/2019 Goal Status: Active Interventions: Provide education on orientation to the wound center Notes: Peripheral Neuropathy Nursing Diagnoses: Knowledge deficit related to disease process and management of peripheral neurovascular dysfunction Goals: Patient/caregiver will verbalize understanding of disease process and disease management Date Initiated: 07/15/2019 Target Resolution Date: 10/18/2019 Goal Status: Active PJ, ZEHNER ([id number]) Interventions: Assess signs and symptoms of neuropathy upon admission and as needed Provide education on Management of Neuropathy and Related Ulcers Notes: Wound/Skin Impairment Nursing Diagnoses: Impaired tissue integrity Goals: Ulcer/skin breakdown will heal within 14 weeks Date Initiated: 07/15/2019 Target Resolution Date: 10/18/2019 Goal Status: Active Interventions: Assess patient/caregiver ability to obtain necessary supplies Assess patient/caregiver ability to perform ulcer/skin care regimen upon  admission and as needed Assess ulceration(s) every visit Notes: Electronic Signature(s) Signed: 02/09/2020 6:49:35 PM By: Montey Hora Entered By: Montey Hora on 02/05/2020 09:03:32 Timothy Gibson ([id number]) -------------------------------------------------------------------------------- Pain Assessment Details Patient Name: Timothy Gibson Date of Service: 02/05/2020 8:30 AM Medical Record Number: [id number] Patient Account Number: [id number] Date of Birth/Sex: December 29, 1950 (70 y.o. M) Treating RN: Cornell Barman Primary Care Kesean Serviss: Merrie Roof Other Clinician: Referring Ara Mano: Merrie Roof Treating Sundeep Destin/Extender: Melburn Hake, [name] Weeks in Treatment: 29 Active Problems Location of Pain Severity and Description of Pain Patient Has Paino No Site Locations With Dressing Change: No Pain Management and Medication Current Pain Management: Electronic Signature(s) Signed: 02/05/2020 4:44:30 PM By: Darci Needle Signed: 02/05/2020 6:25:55 PM By: Gretta Cool, BSN, RN, CWS, Kim RN, BSN Entered By: Darci Needle on 02/05/2020 08:32:44 MILBERN, DOESCHER ([id number]) -------------------------------------------------------------------------------- Patient/Caregiver Education Details Patient Name: Timothy Gibson Date of Service: 02/05/2020 8:30 AM Medical Record Number: [id number] Patient Account Number: [id number] Date of Birth/Gender: July 09, 1951 (69 y.o. M) Treating RN: Montey Hora Primary Care Physician: Merrie Roof Other Clinician: Referring Physician: Merrie Roof Treating Physician/Extender: Melburn Hake,  [name] Weeks in Treatment: 29 Education Assessment Education Provided To: Patient Education Topics Provided Wound/Skin Impairment: Handouts: Other: wound care as ordered Methods: Demonstration, Explain/Verbal Responses: State content correctly Electronic Signature(s) Signed: 02/09/2020 6:49:35 PM By: Montey Hora Entered By: Montey Hora on 02/05/2020  09:08:02 Timothy Gibson ([id number]) -------------------------------------------------------------------------------- Wound Assessment Details Patient Name: Timothy Gibson Date of Service: 02/05/2020 8:30 AM Medical Record Number: [id number] Patient Account Number: [id number] Date of Birth/Sex: 1951-03-24 (69 y.o. M) Treating RN: Cornell Barman Primary Care Korrin Waterfield: Merrie Roof Other Clinician: Referring Brenetta Penny: Merrie Roof Treating Dejha King/Extender: Melburn Hake, [name] Weeks in Treatment: 29 Wound Status Wound Number: 3 Primary Diabetic Wound/Ulcer of the Lower Extremity Etiology: Wound Location: Left Toe Great Wound Open Wounding Event: Gradually Appeared Status: Date Acquired: 06/19/2019 Comorbid Lymphedema, Arrhythmia, Hypertension, Type II Weeks Of Treatment: 29 History: Diabetes, End Stage Renal Disease, Rheumatoid Arthritis, Clustered Wound: No Osteoarthritis, Confinement Anxiety Photos Wound Measurements Length: (cm) 0.4 % Redu Width: (cm) 0.8 % Redu Depth: (cm) 0.3 Epithe Area: (cm) 0.251 [place] Volume: (cm) 0.075 Under ction in Area: 90.7% ction in Volume: 72.3% lialization: Small (1-33%) ling: No mining: No Wound Description Classification: Grade 2 Foul O Wound Margin: Thickened Slough Exudate Amount: Medium Exudate Type: Serous Exudate Color: amber dor After Cleansing: No /Fibrino Yes Wound Bed Granulation Amount: Medium (34-66%) Exposed Structure Granulation Quality: Pink Fascia Exposed: No Necrotic Amount: Medium (34-66%) Fat Layer (Subcutaneous Tissue) Exposed: Yes Necrotic Quality: Adherent Slough Tendon Exposed: No Muscle Exposed: No Joint Exposed: No Bone Exposed: No Treatment Notes Wound #3 (Left Toe Great) Notes hblue, conform, offloading shoe Electronic Signature(s) KORIE, BRABSON ([id number]) Signed: 02/05/2020 4:44:30 PM By: Darci Needle Signed: 02/05/2020 6:25:55 PM By: Gretta Cool, BSN, RN, CWS, Kim RN, BSN Entered By:  Darci Needle on 02/05/2020 08:41:29 PLES, TRUDEL ([id number]) -------------------------------------------------------------------------------- Vitals Details Patient Name: Timothy Gibson Date of Service: 02/05/2020 8:30 AM Medical Record Number: [id number] Patient Account Number: [id number] Date of Birth/Sex: 1951-04-04 (69 y.o. M) Treating RN: Cornell Barman Primary Care Adeli Frost: Merrie Roof Other Clinician: Referring Abir Eroh: Merrie Roof Treating Kalen Neidert/Extender: Melburn Hake, [name] Weeks in Treatment: 29 Vital Signs Time Taken: 08:30 Temperature (F): 98.2 Height (in): 76 Pulse (bpm): 79 Weight (lbs): 244 Respiratory Rate (breaths/min): 18 Body Mass Index (BMI): 29.7 Blood Pressure (mmHg): 173/69 Reference Range: 80 - 120 mg / dl Electronic Signature(s) Signed: 02/05/2020 4:44:30 PM By: Darci Needle Entered By: Darci Needle on 02/05/2020 08:32:26

## 2020-02-11 DIAGNOSIS — N186 End stage renal disease: Secondary | ICD-10-CM | POA: Diagnosis not present

## 2020-02-11 DIAGNOSIS — D509 Iron deficiency anemia, unspecified: Secondary | ICD-10-CM | POA: Diagnosis not present

## 2020-02-11 DIAGNOSIS — N2581 Secondary hyperparathyroidism of renal origin: Secondary | ICD-10-CM | POA: Diagnosis not present

## 2020-02-11 DIAGNOSIS — Z992 Dependence on renal dialysis: Secondary | ICD-10-CM | POA: Diagnosis not present

## 2020-02-11 DIAGNOSIS — D631 Anemia in chronic kidney disease: Secondary | ICD-10-CM | POA: Diagnosis not present

## 2020-02-13 DIAGNOSIS — N186 End stage renal disease: Secondary | ICD-10-CM | POA: Diagnosis not present

## 2020-02-13 DIAGNOSIS — D631 Anemia in chronic kidney disease: Secondary | ICD-10-CM | POA: Diagnosis not present

## 2020-02-13 DIAGNOSIS — N2581 Secondary hyperparathyroidism of renal origin: Secondary | ICD-10-CM | POA: Diagnosis not present

## 2020-02-13 DIAGNOSIS — Z992 Dependence on renal dialysis: Secondary | ICD-10-CM | POA: Diagnosis not present

## 2020-02-13 DIAGNOSIS — D509 Iron deficiency anemia, unspecified: Secondary | ICD-10-CM | POA: Diagnosis not present

## 2020-02-16 DIAGNOSIS — D631 Anemia in chronic kidney disease: Secondary | ICD-10-CM | POA: Diagnosis not present

## 2020-02-16 DIAGNOSIS — N2581 Secondary hyperparathyroidism of renal origin: Secondary | ICD-10-CM | POA: Diagnosis not present

## 2020-02-16 DIAGNOSIS — N186 End stage renal disease: Secondary | ICD-10-CM | POA: Diagnosis not present

## 2020-02-16 DIAGNOSIS — Z992 Dependence on renal dialysis: Secondary | ICD-10-CM | POA: Diagnosis not present

## 2020-02-16 DIAGNOSIS — D509 Iron deficiency anemia, unspecified: Secondary | ICD-10-CM | POA: Diagnosis not present

## 2020-02-18 DIAGNOSIS — N2581 Secondary hyperparathyroidism of renal origin: Secondary | ICD-10-CM | POA: Diagnosis not present

## 2020-02-18 DIAGNOSIS — D509 Iron deficiency anemia, unspecified: Secondary | ICD-10-CM | POA: Diagnosis not present

## 2020-02-18 DIAGNOSIS — D631 Anemia in chronic kidney disease: Secondary | ICD-10-CM | POA: Diagnosis not present

## 2020-02-18 DIAGNOSIS — Z992 Dependence on renal dialysis: Secondary | ICD-10-CM | POA: Diagnosis not present

## 2020-02-18 DIAGNOSIS — N186 End stage renal disease: Secondary | ICD-10-CM | POA: Diagnosis not present

## 2020-02-19 ENCOUNTER — Encounter: Payer: Medicare Other | Attending: Physician Assistant | Admitting: Physician Assistant

## 2020-02-19 ENCOUNTER — Other Ambulatory Visit: Payer: Self-pay

## 2020-02-19 DIAGNOSIS — N186 End stage renal disease: Secondary | ICD-10-CM | POA: Insufficient documentation

## 2020-02-19 DIAGNOSIS — E1122 Type 2 diabetes mellitus with diabetic chronic kidney disease: Secondary | ICD-10-CM | POA: Insufficient documentation

## 2020-02-19 DIAGNOSIS — Z794 Long term (current) use of insulin: Secondary | ICD-10-CM | POA: Insufficient documentation

## 2020-02-19 DIAGNOSIS — E11621 Type 2 diabetes mellitus with foot ulcer: Secondary | ICD-10-CM | POA: Diagnosis not present

## 2020-02-19 DIAGNOSIS — I120 Hypertensive chronic kidney disease with stage 5 chronic kidney disease or end stage renal disease: Secondary | ICD-10-CM | POA: Insufficient documentation

## 2020-02-19 DIAGNOSIS — Z992 Dependence on renal dialysis: Secondary | ICD-10-CM | POA: Diagnosis not present

## 2020-02-19 DIAGNOSIS — M86672 Other chronic osteomyelitis, left ankle and foot: Secondary | ICD-10-CM | POA: Diagnosis not present

## 2020-02-19 DIAGNOSIS — L97522 Non-pressure chronic ulcer of other part of left foot with fat layer exposed: Secondary | ICD-10-CM | POA: Insufficient documentation

## 2020-02-19 DIAGNOSIS — E1169 Type 2 diabetes mellitus with other specified complication: Secondary | ICD-10-CM | POA: Diagnosis not present

## 2020-02-19 DIAGNOSIS — F17290 Nicotine dependence, other tobacco product, uncomplicated: Secondary | ICD-10-CM | POA: Diagnosis not present

## 2020-02-19 NOTE — Progress Notes (Addendum)
[place], [place] ([id number]) Visit Report for 02/19/2020 Chief Complaint Document Details Patient Name: Timothy Gibson, Timothy Gibson Date of Service: 02/19/2020 9:00 AM Medical Record Number: [id number] Patient Account Number: [id number] Date of Birth/Sex: 08-23-1950 (69 y.o. M) Treating RN: Army Melia Primary Care Provider: Merrie Roof Other Clinician: Referring Provider: Merrie Roof Treating Provider/Extender: Melburn Hake, Sheyli Horwitz Weeks in Treatment: 31 Information Obtained from: Patient Chief Complaint Left Great Toe Electronic Signature(s) Signed: 02/19/2020 9:18:52 AM By: Worthy Keeler PA-C Entered By: Worthy Keeler on 02/19/2020 09:18:51 Timothy Gibson ([id number]) -------------------------------------------------------------------------------- Debridement Details Patient Name: Timothy Gibson Date of Service: 02/19/2020 9:00 AM Medical Record Number: [id number] Patient Account Number: [id number] Date of Birth/Sex: 20-Jun-1951 (69 y.o. M) Treating RN: Army Melia Primary Care Provider: Merrie Roof Other Clinician: Referring Provider: Merrie Roof Treating Provider/Extender: Melburn Hake, Shaneice Barsanti Weeks in Treatment: 31 Debridement Performed for Wound #3 Left Toe Great Assessment: Performed By: Physician [name], Andra Heslin E., PA-C Debridement Type: Debridement Severity of Tissue Pre Debridement: Fat layer exposed Level of Consciousness (Pre- Awake and Alert procedure): Pre-procedure Verification/Time Out Yes - 09:37 Taken: Start Time: 09:38 Pain Control: Lidocaine Total Area Debrided (L x W): 0.5 (cm) x 0.9 (cm) = 0.45 (cm) Tissue and other material Viable, Non-Viable, Callus, Subcutaneous debrided: Level: Skin/Subcutaneous Tissue Debridement Description: Excisional Instrument: Curette Bleeding: Minimum Hemostasis Achieved: Pressure End Time: 09:39 Response to Treatment: Procedure was tolerated well Level of Consciousness (Post- Awake and Alert procedure): Post  Debridement Measurements of Total Wound Length: (cm) 0.5 Width: (cm) 0.9 Depth: (cm) 0.2 Volume: (cm) 0.071 Character of Wound/Ulcer Post Debridement: Stable Severity of Tissue Post Debridement: Fat layer exposed Post Procedure Diagnosis Same as Pre-procedure Electronic Signature(s) Signed: 02/19/2020 3:43:33 PM By: Army Melia Signed: 02/20/2020 5:00:44 PM By: Worthy Keeler PA-C Entered By: Army Melia on 02/19/2020 09:39:19 Timothy Gibson ([id number]) -------------------------------------------------------------------------------- HPI Details Patient Name: Timothy Gibson Date of Service: 02/19/2020 9:00 AM Medical Record Number: [id number] Patient Account Number: [id number] Date of Birth/Sex: February 09, 1951 (69 y.o. M) Treating RN: Army Melia Primary Care Provider: Merrie Roof Other Clinician: Referring Provider: Merrie Roof Treating Provider/Extender: Melburn Hake, Anirudh Baiz Weeks in Treatment: 31 History of Present Illness HPI Description: 11/27/17 on evaluation today patient presents for initial evaluation concerning an issue that he has been having with his right great toe which began last Thursday 11/22/17. He has a history of diabetes mellitus type to which he has had for greater than 30 years currently he is on insulin. Subsequently he also has a history of hypertension. On physical exam inspection is also appears he may have some peripheral vascular disease with his ABI's being noncompressible bilaterally. He is on dialysis as well and this is on Monday, Wednesday, and Friday. Patient was hospitalized back in January/February 2019 although this was more for stomach/back pain though they never told me exactly what was going on. This is according to the patient. Subsequently the ulcer which is between the third and fourth toe when space of the right foot as well is on the plantar aspect of the fourth toe is due to him having cleaned his toes this morning and he states that his  finger which is large split the toe tissue in between causing the wounds that we currently see. With that being said he states this has happened before I explained I would definitely recommend that he not do this any longer. He can use a small soft washcloth to clean between the toes without causing trauma. Subsequently the  right great toe hatchery does show evidence of necrotic tissue present on the surface of the wound specifically there is callous and dead skin surrounding which is trapping fluid causing problems as far as the wound is concerned. The surface of the wound does show slough although due to his vascular flow I'm not going to sharply debride this today I think I will selectively debride away the necrotic skin as well is callous from around the surrounding so this will not continue to be a moisture issue. Nonetheless the patient has no pain he does have diabetic neuropathy. 12/06/17-He is here in follow-up evaluation for a right great toe ulcer. He is voicing no complaints or concerns. He continues to infrequently/socially smokes cigars with no desire to quit. He has been compliant with offloading, using open toed surgical shoe; has been compliant using Santyl daily. He continues on clindamycin although takes it inconsistently secondary to indigestion. The plain film x-ray performed on 5/23 impression: Soft tissue swelling of the left first toe and is noted with probable irregular lucency involving distal tuft of first distal phalanx concerning for osteomyelitis. MRI may be performed for further evaluation; MRI ordered. The vascular evaluation performed on 5/24 field non- compressible ABIs bilaterally and reduced TBI bilaterally suggesting significant tibial disease. He will be referred to vascular medicine for further evaluation. 12/13/17-He is here in follow-up evaluation for right great toe ulcer. He has appointment next Thursday for the MRI and vascular evaluation. Wound culture  obtained last week grew abundant Klebsiella oxytoca (multidrug sensitivity), and abundant enteric coccus faecalis (sensitive to ampicillin). Ampicillin and Cipro were called in, along with a probiotic. In light of his appointments next Thursday he will follow-up in 2 weeks, we will continue with Santyl 12/27/17-He is here for evaluation for a right great toe ulcer. 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 He did have evaluation by vascular medicine, Dr. Ronalee Belts, on 6/13, at that appointment it was decided for him to undergo angiography with possible intervention but he refused and is still considering. If he chooses to have it done he will contact the office. He has not heard back from either ID offices that he was referred to two weeks ago: [place] and [place]. there is improvement in both appearance and measurement to the ulcer. We will extend antibiotic therapy (amoxicillin 500 every 12, Cipro 500 daily) for additional 2 weeks pending infectious disease consult, he will continue with Santyl daily. He was reminded to continue with probiotic therapy while on oral antibiotics; he denies any GI disturbance. 01/03/18-He is here in follow-up evaluation for right great toe ulcer. He is decided to not undergo any vascular intervention at this time. He was established with [facility] infectious disease (Dr. Linus Salmons) last week initiated on vancomycin and cefazolin with dialysis treatment for 6 weeks. We will continue with current treatment plan with Santyl and offloading and he will follow-up in 2 weeks 01/17/18-He is seen in follow-up evaluation for right great toe ulcer. There is improvement in appearance with less nonviable tissue present. We will continue with same treatment plan he will follow-up next week. He is tolerating IV antibiotics without any complications 1/60/73-XT is seen in follow up evaluation for right great toe  ulcer. There continues to be improvement. He is tolerating IV antibiotics with hemodialysis, completion date of 7/31. We will transition to collagen and and follow-up next week 02/07/18-He is here in follow up for a right great  toe ulcer. There is red granulation tissue throughout the wound, improved in appearance. He completed antibiotics yesterday. He saw podiatry last Thursday for nail trimming, at that appointment he periwound callus was trimmed and a culture was obtained; culture negative. He does not have a follow up appointment with infectious disease. We will submit for grafix. 02/14/18-He is here in follow-up evaluation for a right great toe ulcer. There is slow improvement, red granulation tissue throughout. The insurance approval for grafix is pending. We will continue with collagen and he'll follow-up next week 02/21/18-He is seen in follow-up evaluation for right great toe ulcer. There is improvement with red granulation tissue throughout. He was approved for oasis and this was placed today. He will follow up next week 02/28/18-He is seen in follow-up evaluation for right great toe ulcer. The wound is stable, oasis applied and he'll follow-up next week 03/07/18-He is seen in follow-up violation of her right great toe ulcer. The wound is dry, no evidence of drainage. The wound appears healed today. He was painted with Betadine, instructed to paint with Betadine daily, cover with band-aid for the next week and then cover with band-aid for the following week continue with surgical shoe. He was encouraged to contact the clinic with any evidence of drainage, or change in appearance to toe/wound. He will be discharged from wound care services Readmission: 07/14/18 on evaluation today patient presents for initial evaluation our clinic concerning issues that he is having with his left great toe. We previously saw him in 2019 for his right great toe which was actually worse at that time and subsequently  was able to be completely healed in the end although it did take several months. With that being said the patient is currently having an area on his left great toe that occurred initially as result of a blister that came up he's not really sure why or how. Subsequently this was initially treated by his podiatrist though the recommendation there was apparently amputation according to the patient and his daughter who was present with him today. With that being said the patient did not want to proceed with amputation and would like to try to perform wound care and get this to heal without having to go down that road. We were able to do that with his other toe and so he would like to at least give that a try before going forward with amputation. Again I completely understand his concern in this regard. With that being said I did discuss with the patient obviously that it's always a chance that we cannot get this to heal with normal wound care measures but obviously we will give it our best shot. He does actually have an appointment scheduled for later today with the vascular specialist in order to evaluate his blood flow. That was at the recommendation of his podiatrist as well. Obviously if he is having good arterial flow and nonetheless is still experiencing issues with having the wound delay in healing then it may simply be a matter of we need to initiate more aggressive wound to therapy in order to see if we can get this to heal. No fevers, chills, JAQUAY, POSTHUMUS. ([id number]) nausea, or vomiting noted at this time. The patient notes that this woman has been present for approximately three weeks. He is not a smoker. His most recent hemoglobin A1c was 6.2 on 06/18/19. 07/17/2019 on evaluation today patient appears unfortunately to not be doing as well. He did see vascular I did  review the note as well. That was on 07/15/2019. Subsequently there can actually be taking him to the OR for an angiogram due to  poor arterial flow into this extremity. Apparently the atherosclerotic changes were severe. The patient is stated to be at risk for limb loss. Nonetheless the good news is this is being scheduled fairly quickly he will have the surgery/procedure next Thursday. With that being said I am going also go ahead based on the results of his x-ray which showed some chance of osteomyelitis in the distal portion of the toe get the patient set up for an MRI to further evaluate for the possibility of osteomyelitis obviously if he does have osteomyelitis we need to know so that we can address this appropriately. 07/29/2019 upon evaluation today patient appears to be doing better in general with regard to his toe ulcer. He does have much better blood flow you can actually feel a palpable pulse which appears to be very strong at this time. I did review his arterial intervention/angiogram which did show that he had evidence of sufficient arterial flow restoration at this point. He did have occlusions that were 60 and 80% that residually were only 10 and 25% with no limiting flow. This is excellent news and hopefully he will continue to show signs of improvement in light of the improved vascular status. With that being said the toe mainly shows somewhat of an eschar today I think that this is fairly stable and as such probably would be best not to really do much in the way of debridement at this point I do believe Betadine would likely be a good option. 08/07/2019 on evaluation today patient actually appears to be doing about the same. The one difference is the eschar that we were just seeing if we can maintain is actually starting to loosen up and leak around the edges I think it is time to actually go ahead and remove this today. Is also no longer stable is very soft. Nonetheless the patient is okay with proceeding with debridement at this point. Fortunately there is no signs of active infection. No fevers, chills,  nausea, vomiting, or diarrhea. The patient states that he took his last antibiotic that he had last night. He would need a refill as he will not be seeing infectious disease until February 2. Nonetheless obviously I do not want him to have any break in therapy especially when he is doing as well as he is currently. 08/14/2019 on evaluation today patient appears to be doing quite well all things considered in regard to his toe. I do feel like he is making progress and though this is not completely healed by any means we still have some ways to go I feel like he is making progress which is excellent. There is no sign of active infection at this time systemically. He does have osteomyelitis of the distal phalanx of his great toe on the left. He did see Dr. Linus Salmons. That is the infectious disease doctor in [place] where he was referred. Dr. Linus Salmons apparently according to the note did not feel like the patient's MRI revealed osteomyelitis but just reactive marrow and subsequently did not recommend any antibiotic therapy whatsoever. To be peripherally honest I am not really in agreement with this based on the MRI results and what we are seeing and I think he has been responding at least decently well to oral medication as well. Nonetheless we may want to check into the possibility of IV antibiotics  being administered at the dialysis center. I will discuss this with Dr. Dellia Nims further. 08/21/2019 upon evaluation today patient appears at this point to be making some progress with regard to his wounds. He has been tolerating the dressing changes without complication. Fortunately there is no evidence of active infection at this time. Overall I feel like he is doing well with the oral antibiotics I discussed with Dr. Dellia Nims the possibility of doing IV antibiotic therapy versus continuing with the oral. His opinion was if the patient is doing well with oral to maybe stick with that. Obviously we can initiate  additional Cipro as well just to help ensure that there is nothing worsening here. The Cipro should be beneficial for him as well. That helps cover more gram-negative's were is at the doxycycline is more gram- positive's. 09/02/2019 on evaluation today patient appears to be doing fairly well upon inspection today. With that being said though the patient is continuing to do well he does also continue to develop issues with buildup of necrotic tissue on the distal portion of his toe he does have good arterial flow however. For that reason I am going to suggest that what we may need to do is consider using something such as Santyl to try to keep this free and clear. Also discussed in greater detail HBO therapy with him today please see plan for additional recommendations and results of this discussion. 09/11/2019 upon evaluation today patient appears to be doing well with regard to his toe ulcer. This actually appears to be much better today using the Santyl than it was previous. Fortunately there is no signs of active infection. No fevers, chills, nausea, vomiting, or diarrhea. 09/18/2019 upon evaluation today patient appears to be doing much better in regard to his wound in general. The Santyl was doing a good job at loosening stuff up here for [place] which is excellent news. With that being said he is still taking the oral antibiotic. I sent this in last for him on 09/03/2019. This was the doxycycline. When this runs out he will actually be complete as far as the treatment is concerned. With that being said I am getting continue the Cipro for 1 additional month. Overall his wound seems to be doing quite well. 09/25/2019 upon evaluation today patient appears to be doing well with regard to his toe ulcer. This is measuring much better and overall seems to be making good progress. Fortunately there is no signs of active infection at this time. No fevers, chills, nausea, vomiting, or diarrhea. 10/02/2019 upon  evaluation today patient appears to be doing well with regard to his wound. He has been tolerating the dressing changes without complication. Fortunately there is no signs of active infection at this time. No fevers, chills, nausea, vomiting, or diarrhea. Overall I am extremely pleased with how things seem to be progressing. I think the wound bed is at the point now where we can likely proceed with additional measures to try to get this healed more quickly I think Dermagraft may be a good possibility for the patient. We can definitely look into seeing about approval for this. 10/09/2019 upon evaluation today patient's wound actually appears to be doing well although it still is progressing somewhat slowly compared to what I would like to see. With that being said I did order Apligraf for the patient which I felt would keep the wound nice and moist without allowing it to dry out and hopefully allow this to heal much more effectively  and quickly. The patient actually did obtain approval and therefore we can apply that today. 10/23/2019 upon evaluation today patient's wound actually appears to be doing significantly better with regard to his toe ulcer. The tissue at the base of the wound is greatly improved and overall I am very pleased with the progress made. We did use Apligraf 2 weeks ago on the toe that did excellent for him much it has completely dissolved after as of the 2 weeks. They only had to change the dressing a few times due to drainage through. Obviously they left the Steri-Strips and Mepitel in place. Unfortunately we do not have the Apligraf available today for application regard therefore have to use a different product here in the clinic today and then order the Apligraf for next Thursday. That will be his second application. 4/22; patient arrived with overhanging skin and subcutaneous tissue on the wound margins this was removed. Apligraf #2 applied 11/13/2019 upon evaluation today patient  actually appears to be doing well with regard to his foot ulcer. He has been tolerating the dressing changes without complication. The overall wound bed appears to be healthier and has filled in as well as not nearly as deep as it was the tissue is also better quality. Overall I am extremely pleased with how things seem to be progressing. The patient is likewise very happy in this regard. 5/20; wound is measuring smaller had Apligraf #3 last time. Apligraf #4 today Timothy Gibson, Timothy Gibson ([id number]) 12/12/2019 upon evaluation today patient actually appears to be doing quite well with regard to his wound in my opinion. The ulcer is significantly smaller compared to last time I saw him although that has been several weeks. I do believe the Apligraf has been beneficial he has 1 remaining today this will be application #5. 6/38/4536 upon evaluation today patient appears to be doing well with regard to his toe ulcer. He is measuring about the same today but he has come a long way from where this started. Fortunately there is no evidence of active infection which is great news and overall I am very pleased with the progress that is been made. Nonetheless at this point we have completed the Apligraf course he did see improvement through this but I am going to switch to something different as of today. 01/08/2020 upon evaluation today patient appears to be doing much better in regards to his toe ulcer this is measuring smaller and overall very pleased with the progress. The Hydrofera Blue seems to be doing a great job. Fortunately there is no evidence of active infection at this time. No fevers, chills, nausea, vomiting, or diarrhea. 01/29/2020 upon evaluation today patient appears to be doing okay in regard to his toe ulcer. The main issue I see is that he actually has trouble right now with callus growing and covering over the wound bed. This prevents him from being able to get a dressing in contact with the  wound bed and subsequently fluid collection underneath. I think that we probably need to see him more frequently to keep this pared down and debrided to the point to allow the dressings to effectively heal this wound. I discussed that with the patient today. 02/05/2020 on evaluation today patient appears to be doing well with regard to his wounds currently. In fact he just has 1 on his toe remaining at this point he seems to be doing quite well in my opinion. I am very pleased with the overall appearance today. There is  no signs of active infection I do think keeping the callus trimmed down has been of great benefit as far as healing is concerned. I do not think we need to go 3 weeks out like we were in the past I believe that is been somewhat more detrimental than good for him. 02/19/2020 upon evaluation today patient appears to be doing better in regard to his toe ulcer still there is callus buildup around the top of the toe which unfortunately is I think causing some issues here with trapping a little fluid underneath them after cleaning this away. Fortunately there is no evidence of active infection at this time. Overall I feel like the patient is doing quite well which is great news. Electronic Signature(s) Signed: 02/19/2020 9:48:13 AM By: Worthy Keeler PA-C Entered By: Worthy Keeler on 02/19/2020 09:48:13 Timothy Gibson, Timothy Gibson ([id number]) -------------------------------------------------------------------------------- Physical Exam Details Patient Name: Timothy Gibson Date of Service: 02/19/2020 9:00 AM Medical Record Number: [id number] Patient Account Number: [id number] Date of Birth/Sex: 1951-04-06 (69 y.o. M) Treating RN: Army Melia Primary Care Provider: Merrie Roof Other Clinician: Referring Provider: Merrie Roof Treating Provider/Extender: [name], Jolicia Delira Weeks in Treatment: 52 Constitutional Well-nourished and well-hydrated in no acute distress. Respiratory normal  breathing without difficulty. Psychiatric this patient is able to make decisions and demonstrates good insight into disease process. Alert and Oriented x 3. pleasant and cooperative. Notes Upon inspection patient's wound bed did require sharp debridement to remove callus and slough from the surface of the wound down to good subcutaneous tissue he tolerated the debridement today without complication post debridement wound bed appears to be doing much better which is great news. Electronic Signature(s) Signed: 02/19/2020 9:48:28 AM By: Worthy Keeler PA-C Entered By: Worthy Keeler on 02/19/2020 09:48:28 Timothy Gibson, Timothy Gibson ([id number]) -------------------------------------------------------------------------------- Physician Orders Details Patient Name: Timothy Gibson Date of Service: 02/19/2020 9:00 AM Medical Record Number: [id number] Patient Account Number: [id number] Date of Birth/Sex: 04/01/1951 (69 y.o. M) Treating RN: Army Melia Primary Care Provider: Merrie Roof Other Clinician: Referring Provider: Merrie Roof Treating Provider/Extender: Melburn Hake, Chandler Swiderski Weeks in Treatment: 39 Verbal / Phone Orders: No Diagnosis Coding ICD-10 Coding Code Description [phone] Other chronic osteomyelitis, left ankle and foot E11.621 Type 2 diabetes mellitus with foot ulcer L97.522 Non-pressure chronic ulcer of other part of left foot with fat layer exposed I10 Essential (primary) hypertension Wound Cleansing Wound #3 Left Toe Great o Dial antibacterial soap, wash wounds, rinse and pat dry prior to dressing wounds o May Shower, gently pat wound dry prior to applying new dressing. Anesthetic (add to Medication List) Wound #3 Left Toe Great o Topical Lidocaine 4% cream applied to wound bed prior to debridement (In Clinic Only). Primary Wound Dressing Wound #3 Left Toe Great o Other: - Endoform, moisten with hydrogel Secondary Dressing Wound #3 Left Toe Great o  Conform/Kerlix Dressing Change Frequency Wound #3 Left Toe Great o Other: - As needed Follow-up Appointments Wound #3 Left Toe Great o Return Appointment in 1 week. Electronic Signature(s) Signed: 02/19/2020 3:43:33 PM By: Army Melia Signed: 02/20/2020 5:00:44 PM By: Worthy Keeler PA-C Entered By: Army Melia on 02/19/2020 09:40:55 Timothy Gibson, Timothy Gibson ([id number]) -------------------------------------------------------------------------------- Problem List Details Patient Name: Timothy Gibson Date of Service: 02/19/2020 9:00 AM Medical Record Number: [id number] Patient Account Number: [id number] Date of Birth/Sex: Nov 11, 1950 (69 y.o. M) Treating RN: Army Melia Primary Care Provider: Merrie Roof Other Clinician: Referring Provider: Merrie Roof Treating Provider/Extender: [name], Inanna Telford Weeks in Treatment: 4  Active Problems ICD-10 Encounter Code Description Active Date MDM Diagnosis [phone] Other chronic osteomyelitis, left ankle and foot 07/29/2019 No Yes E11.621 Type 2 diabetes mellitus with foot ulcer 07/15/2019 No Yes L97.522 Non-pressure chronic ulcer of other part of left foot with fat layer 07/15/2019 No Yes exposed [place] (primary) hypertension 07/15/2019 No Yes Inactive Problems Resolved Problems Electronic Signature(s) Signed: 02/19/2020 9:18:45 AM By: Worthy Keeler PA-C Entered By: Worthy Keeler on 02/19/2020 09:18:45 Timothy Gibson ([id number]) -------------------------------------------------------------------------------- Progress Note Details Patient Name: Timothy Gibson Date of Service: 02/19/2020 9:00 AM Medical Record Number: [id number] Patient Account Number: [id number] Date of Birth/Sex: 02/24/1951 (69 y.o. M) Treating RN: Army Melia Primary Care Provider: Merrie Roof Other Clinician: Referring Provider: Merrie Roof Treating Provider/Extender: Melburn Hake, Zafar Debrosse Weeks in Treatment: 31 Subjective Chief Complaint Information  obtained from Patient Left Great Toe History of Present Illness (HPI) 11/27/17 on evaluation today patient presents for initial evaluation concerning an issue that he has been having with his right great toe which began last Thursday 11/22/17. He has a history of diabetes mellitus type to which he has had for greater than 30 years currently he is on insulin. Subsequently he also has a history of hypertension. On physical exam inspection is also appears he may have some peripheral vascular disease with his ABI's being noncompressible bilaterally. He is on dialysis as well and this is on Monday, Wednesday, and Friday. Patient was hospitalized back in January/February 2019 although this was more for stomach/back pain though they never told me exactly what was going on. This is according to the patient. Subsequently the ulcer which is between the third and fourth toe when space of the right foot as well is on the plantar aspect of the fourth toe is due to him having cleaned his toes this morning and he states that his finger which is large split the toe tissue in between causing the wounds that we currently see. With that being said he states this has happened before I explained I would definitely recommend that he not do this any longer. He can use a small soft washcloth to clean between the toes without causing trauma. Subsequently the right great toe hatchery does show evidence of necrotic tissue present on the surface of the wound specifically there is callous and dead skin surrounding which is trapping fluid causing problems as far as the wound is concerned. The surface of the wound does show slough although due to his vascular flow I'm not going to sharply debride this today I think I will selectively debride away the necrotic skin as well is callous from around the surrounding so this will not continue to be a moisture issue. Nonetheless the patient has no pain he does have diabetic  neuropathy. 12/06/17-He is here in follow-up evaluation for a right great toe ulcer. He is voicing no complaints or concerns. He continues to infrequently/socially smokes cigars with no desire to quit. He has been compliant with offloading, using open toed surgical shoe; has been compliant using Santyl daily. He continues on clindamycin although takes it inconsistently secondary to indigestion. The plain film x-ray performed on 5/23 impression: Soft tissue swelling of the left first toe and is noted with probable irregular lucency involving distal tuft of first distal phalanx concerning for osteomyelitis. MRI may be performed for further evaluation; MRI ordered. The vascular evaluation performed on 5/24 field non- compressible ABIs bilaterally and reduced TBI bilaterally suggesting significant tibial disease. He will be referred to vascular  medicine for further evaluation. 12/13/17-He is here in follow-up evaluation for right great toe ulcer. He has appointment next Thursday for the MRI and vascular evaluation. Wound culture obtained last week grew abundant Klebsiella oxytoca (multidrug sensitivity), and abundant enteric coccus faecalis (sensitive to ampicillin). Ampicillin and Cipro were called in, along with a probiotic. In light of his appointments next Thursday he will follow-up in 2 weeks, we will continue with Santyl 12/27/17-He is here for evaluation for a right great toe ulcer. 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 He did have evaluation by vascular medicine, Dr. Ronalee Belts, on 6/13, at that appointment it was decided for him to undergo angiography with possible intervention but he refused and is still considering. If he chooses to have it done he will contact the office. He has not heard back from either ID offices that he was referred to two weeks ago: [place] and [place]. there is improvement in both appearance  and measurement to the ulcer. We will extend antibiotic therapy (amoxicillin 500 every 12, Cipro 500 daily) for additional 2 weeks pending infectious disease consult, he will continue with Santyl daily. He was reminded to continue with probiotic therapy while on oral antibiotics; he denies any GI disturbance. 01/03/18-He is here in follow-up evaluation for right great toe ulcer. He is decided to not undergo any vascular intervention at this time. He was established with [facility] infectious disease (Dr. Linus Salmons) last week initiated on vancomycin and cefazolin with dialysis treatment for 6 weeks. We will continue with current treatment plan with Santyl and offloading and he will follow-up in 2 weeks 01/17/18-He is seen in follow-up evaluation for right great toe ulcer. There is improvement in appearance with less nonviable tissue present. We will continue with same treatment plan he will follow-up next week. He is tolerating IV antibiotics without any complications 7/82/42-PN is seen in follow up evaluation for right great toe ulcer. There continues to be improvement. He is tolerating IV antibiotics with hemodialysis, completion date of 7/31. We will transition to collagen and and follow-up next week 02/07/18-He is here in follow up for a right great toe ulcer. There is red granulation tissue throughout the wound, improved in appearance. He completed antibiotics yesterday. He saw podiatry last Thursday for nail trimming, at that appointment he periwound callus was trimmed and a culture was obtained; culture negative. He does not have a follow up appointment with infectious disease. We will submit for grafix. 02/14/18-He is here in follow-up evaluation for a right great toe ulcer. There is slow improvement, red granulation tissue throughout. The insurance approval for grafix is pending. We will continue with collagen and he'll follow-up next week 02/21/18-He is seen in follow-up evaluation for right great toe  ulcer. There is improvement with red granulation tissue throughout. He was approved for oasis and this was placed today. He will follow up next week 02/28/18-He is seen in follow-up evaluation for right great toe ulcer. The wound is stable, oasis applied and he'll follow-up next week 03/07/18-He is seen in follow-up violation of her right great toe ulcer. The wound is dry, no evidence of drainage. The wound appears healed today. He was painted with Betadine, instructed to paint with Betadine daily, cover with band-aid for the next week and then cover with band-aid for the following week continue with surgical shoe. He was encouraged to contact the clinic with any evidence of drainage, or change in appearance to toe/wound.  He will be discharged from wound care services Readmission: 07/14/18 on evaluation today patient presents for initial evaluation our clinic concerning issues that he is having with his left great toe. We previously saw him in 2019 for his right great toe which was actually worse at that time and subsequently was able to be completely healed in the end although it did take several months. With that being said the patient is currently having an area on his left great toe that occurred initially as result of a blister that came up he's not really sure why or how. Subsequently this was initially treated by his podiatrist though the recommendation there was apparently amputation according to the patient and his daughter who was present with him today. With that being said the patient did not want to proceed with amputation and would like to try to perform wound care and get this to heal without having to go down that road. We were able to do that with his other toe and so he would like to at least give that a try before going forward with amputation. Timothy Gibson, Timothy Gibson ([id number]) Again I completely understand his concern in this regard. With that being said I did discuss with the patient  obviously that it's always a chance that we cannot get this to heal with normal wound care measures but obviously we will give it our best shot. He does actually have an appointment scheduled for later today with the vascular specialist in order to evaluate his blood flow. That was at the recommendation of his podiatrist as well. Obviously if he is having good arterial flow and nonetheless is still experiencing issues with having the wound delay in healing then it may simply be a matter of we need to initiate more aggressive wound to therapy in order to see if we can get this to heal. No fevers, chills, nausea, or vomiting noted at this time. The patient notes that this woman has been present for approximately three weeks. He is not a smoker. His most recent hemoglobin A1c was 6.2 on 06/18/19. 07/17/2019 on evaluation today patient appears unfortunately to not be doing as well. He did see vascular I did review the note as well. That was on 07/15/2019. Subsequently there can actually be taking him to the OR for an angiogram due to poor arterial flow into this extremity. Apparently the atherosclerotic changes were severe. The patient is stated to be at risk for limb loss. Nonetheless the good news is this is being scheduled fairly quickly he will have the surgery/procedure next Thursday. With that being said I am going also go ahead based on the results of his x-ray which showed some chance of osteomyelitis in the distal portion of the toe get the patient set up for an MRI to further evaluate for the possibility of osteomyelitis obviously if he does have osteomyelitis we need to know so that we can address this appropriately. 07/29/2019 upon evaluation today patient appears to be doing better in general with regard to his toe ulcer. He does have much better blood flow you can actually feel a palpable pulse which appears to be very strong at this time. I did review his arterial intervention/angiogram which  did show that he had evidence of sufficient arterial flow restoration at this point. He did have occlusions that were 60 and 80% that residually were only 10 and 25% with no limiting flow. This is excellent news and hopefully he will continue to show signs of  improvement in light of the improved vascular status. With that being said the toe mainly shows somewhat of an eschar today I think that this is fairly stable and as such probably would be best not to really do much in the way of debridement at this point I do believe Betadine would likely be a good option. 08/07/2019 on evaluation today patient actually appears to be doing about the same. The one difference is the eschar that we were just seeing if we can maintain is actually starting to loosen up and leak around the edges I think it is time to actually go ahead and remove this today. Is also no longer stable is very soft. Nonetheless the patient is okay with proceeding with debridement at this point. Fortunately there is no signs of active infection. No fevers, chills, nausea, vomiting, or diarrhea. The patient states that he took his last antibiotic that he had last night. He would need a refill as he will not be seeing infectious disease until February 2. Nonetheless obviously I do not want him to have any break in therapy especially when he is doing as well as he is currently. 08/14/2019 on evaluation today patient appears to be doing quite well all things considered in regard to his toe. I do feel like he is making progress and though this is not completely healed by any means we still have some ways to go I feel like he is making progress which is excellent. There is no sign of active infection at this time systemically. He does have osteomyelitis of the distal phalanx of his great toe on the left. He did see Dr. Linus Salmons. That is the infectious disease doctor in [place] where he was referred. Dr. Linus Salmons apparently according to the note did not  feel like the patient's MRI revealed osteomyelitis but just reactive marrow and subsequently did not recommend any antibiotic therapy whatsoever. To be peripherally honest I am not really in agreement with this based on the MRI results and what we are seeing and I think he has been responding at least decently well to oral medication as well. Nonetheless we may want to check into the possibility of IV antibiotics being administered at the dialysis center. I will discuss this with Dr. Dellia Nims further. 08/21/2019 upon evaluation today patient appears at this point to be making some progress with regard to his wounds. He has been tolerating the dressing changes without complication. Fortunately there is no evidence of active infection at this time. Overall I feel like he is doing well with the oral antibiotics I discussed with Dr. Dellia Nims the possibility of doing IV antibiotic therapy versus continuing with the oral. His opinion was if the patient is doing well with oral to maybe stick with that. Obviously we can initiate additional Cipro as well just to help ensure that there is nothing worsening here. The Cipro should be beneficial for him as well. That helps cover more gram-negative's were is at the doxycycline is more gram- positive's. 09/02/2019 on evaluation today patient appears to be doing fairly well upon inspection today. With that being said though the patient is continuing to do well he does also continue to develop issues with buildup of necrotic tissue on the distal portion of his toe he does have good arterial flow however. For that reason I am going to suggest that what we may need to do is consider using something such as Santyl to try to keep this free and clear. Also discussed in  greater detail HBO therapy with him today please see plan for additional recommendations and results of this discussion. 09/11/2019 upon evaluation today patient appears to be doing well with regard to his toe  ulcer. This actually appears to be much better today using the Santyl than it was previous. Fortunately there is no signs of active infection. No fevers, chills, nausea, vomiting, or diarrhea. 09/18/2019 upon evaluation today patient appears to be doing much better in regard to his wound in general. The Santyl was doing a good job at loosening stuff up here for [place] which is excellent news. With that being said he is still taking the oral antibiotic. I sent this in last for him on 09/03/2019. This was the doxycycline. When this runs out he will actually be complete as far as the treatment is concerned. With that being said I am getting continue the Cipro for 1 additional month. Overall his wound seems to be doing quite well. 09/25/2019 upon evaluation today patient appears to be doing well with regard to his toe ulcer. This is measuring much better and overall seems to be making good progress. Fortunately there is no signs of active infection at this time. No fevers, chills, nausea, vomiting, or diarrhea. 10/02/2019 upon evaluation today patient appears to be doing well with regard to his wound. He has been tolerating the dressing changes without complication. Fortunately there is no signs of active infection at this time. No fevers, chills, nausea, vomiting, or diarrhea. Overall I am extremely pleased with how things seem to be progressing. I think the wound bed is at the point now where we can likely proceed with additional measures to try to get this healed more quickly I think Dermagraft may be a good possibility for the patient. We can definitely look into seeing about approval for this. 10/09/2019 upon evaluation today patient's wound actually appears to be doing well although it still is progressing somewhat slowly compared to what I would like to see. With that being said I did order Apligraf for the patient which I felt would keep the wound nice and moist without allowing it to dry out and hopefully  allow this to heal much more effectively and quickly. The patient actually did obtain approval and therefore we can apply that today. 10/23/2019 upon evaluation today patient's wound actually appears to be doing significantly better with regard to his toe ulcer. The tissue at the base of the wound is greatly improved and overall I am very pleased with the progress made. We did use Apligraf 2 weeks ago on the toe that did excellent for him much it has completely dissolved after as of the 2 weeks. They only had to change the dressing a few times due to drainage through. Obviously they left the Steri-Strips and Mepitel in place. Unfortunately we do not have the Apligraf available today for application regard therefore have to use a different product here in the clinic today and then order the Apligraf for next Thursday. That will be his second application. 4/22; patient arrived with overhanging skin and subcutaneous tissue on the wound margins this was removed. Apligraf #2 applied Timothy Gibson, Timothy Gibson ([id number]) 11/13/2019 upon evaluation today patient actually appears to be doing well with regard to his foot ulcer. He has been tolerating the dressing changes without complication. The overall wound bed appears to be healthier and has filled in as well as not nearly as deep as it was the tissue is also better quality. Overall I am extremely pleased  with how things seem to be progressing. The patient is likewise very happy in this regard. 5/20; wound is measuring smaller had Apligraf #3 last time. Apligraf #4 today 12/12/2019 upon evaluation today patient actually appears to be doing quite well with regard to his wound in my opinion. The ulcer is significantly smaller compared to last time I saw him although that has been several weeks. I do believe the Apligraf has been beneficial he has 1 remaining today this will be application #5. 8/67/6195 upon evaluation today patient appears to be doing well with regard  to his toe ulcer. He is measuring about the same today but he has come a long way from where this started. Fortunately there is no evidence of active infection which is great news and overall I am very pleased with the progress that is been made. Nonetheless at this point we have completed the Apligraf course he did see improvement through this but I am going to switch to something different as of today. 01/08/2020 upon evaluation today patient appears to be doing much better in regards to his toe ulcer this is measuring smaller and overall very pleased with the progress. The Hydrofera Blue seems to be doing a great job. Fortunately there is no evidence of active infection at this time. No fevers, chills, nausea, vomiting, or diarrhea. 01/29/2020 upon evaluation today patient appears to be doing okay in regard to his toe ulcer. The main issue I see is that he actually has trouble right now with callus growing and covering over the wound bed. This prevents him from being able to get a dressing in contact with the wound bed and subsequently fluid collection underneath. I think that we probably need to see him more frequently to keep this pared down and debrided to the point to allow the dressings to effectively heal this wound. I discussed that with the patient today. 02/05/2020 on evaluation today patient appears to be doing well with regard to his wounds currently. In fact he just has 1 on his toe remaining at this point he seems to be doing quite well in my opinion. I am very pleased with the overall appearance today. There is no signs of active infection I do think keeping the callus trimmed down has been of great benefit as far as healing is concerned. I do not think we need to go 3 weeks out like we were in the past I believe that is been somewhat more detrimental than good for him. 02/19/2020 upon evaluation today patient appears to be doing better in regard to his toe ulcer still there is callus  buildup around the top of the toe which unfortunately is I think causing some issues here with trapping a little fluid underneath them after cleaning this away. Fortunately there is no evidence of active infection at this time. Overall I feel like the patient is doing quite well which is great news. Objective Constitutional Well-nourished and well-hydrated in no acute distress. Vitals Time Taken: 9:00 AM, Height: 76 in, Weight: 244 lbs, BMI: 29.7, Temperature: 97.5 F, Pulse: 80 bpm, Respiratory Rate: 18 breaths/min, Blood Pressure: 144/80 mmHg. Respiratory normal breathing without difficulty. Psychiatric this patient is able to make decisions and demonstrates good insight into disease process. Alert and Oriented x 3. pleasant and cooperative. General Notes: Upon inspection patient's wound bed did require sharp debridement to remove callus and slough from the surface of the wound down to good subcutaneous tissue he tolerated the debridement today without complication post  debridement wound bed appears to be doing much better which is great news. Integumentary (Hair, Skin) Wound #3 status is Open. Original cause of wound was Gradually Appeared. The wound is located on the Left Toe Great. The wound measures 0.5cm length x 0.9cm width x 0.2cm depth; 0.353cm^2 area and 0.071cm^3 volume. There is Fat Layer (Subcutaneous Tissue) Exposed exposed. There is a medium amount of serous drainage noted. The wound margin is thickened. There is medium (34-66%) pink granulation within the wound bed. There is a medium (34-66%) amount of necrotic tissue within the wound bed including Adherent Slough. 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 There was a Excisional Skin/Subcutaneous Tissue Debridement with a total area of 0.45 sq cm performed by [name], Waynesha Rammel E., PA-C. With the following instrument(s): Curette to remove Viable and Non-Viable tissue/material. Material removed includes Callus and Subcutaneous Tissue and after achieving pain control using Lidocaine. A time out was conducted at 09:37, prior to the start of the procedure. A Minimum amount of bleeding was controlled with Pressure. The procedure was tolerated well. Post Debridement Measurements: 0.5cm length x 0.9cm width x 0.2cm depth; 0.071cm^3 volume. Character of Wound/Ulcer Post Debridement is stable. Severity of Tissue Post Debridement is: Fat layer exposed. Post procedure Diagnosis Wound #3: Same as Pre-Procedure Plan Wound Cleansing: Wound #3 Left Toe Great: Dial antibacterial soap, wash wounds, rinse and pat dry prior to dressing wounds May Shower, gently pat wound dry prior to applying new dressing. Anesthetic (add to Medication List): Wound #3 Left Toe Great: Topical Lidocaine 4% cream applied to wound bed prior to debridement (In Clinic Only). Primary Wound Dressing: Wound #3 Left Toe Great: Other: - Endoform, moisten with hydrogel Secondary Dressing: Wound #3 Left Toe Great: Conform/Kerlix Dressing Change Frequency: Wound #3 Left Toe Great: Other: - As needed Follow-up Appointments: Wound #3 Left Toe Great: Return Appointment in 1 week. 1. I am going to recommend currently that we go ahead and continue with the management of the patient's wound we will switch however to endoform which she is used in the past this to be moistened with hydrogel. 2. I am also can recommend that the patient continue to be monitored for any signs of worsening or infection I really think he does need to likely have weekly  visits to try to keep the callus down especially with weights try to grow over. 3. I am also can recommend the patient continue to keep the area clean and dry I would not recommend getting in a pool or showering at this point just because of how it could trap fluid and therefore bacteria underneath the edges of the wound. We will see patient back for reevaluation in 1 week here in the clinic. If anything worsens or changes patient will contact our office for additional recommendations. Electronic Signature(s) Signed: 02/19/2020 9:49:15 AM By: Worthy Keeler PA-C Entered By: Worthy Keeler on 02/19/2020 09:49:15 Timothy Gibson, Timothy Gibson ([id number]) -------------------------------------------------------------------------------- SuperBill Details Patient Name: Timothy Gibson Date of Service: 02/19/2020 Medical Record Number: [id number] Patient Account Number: [id number] Date of Birth/Sex: 04/29/1951 (69 y.o. M) Treating RN: Army Melia Primary Care Provider:  LANE, RACHEL Other Clinician: Referring Provider: Merrie Roof Treating Provider/Extender: Melburn Hake, Dayjah Selman Weeks in Treatment: 31 Diagnosis Coding ICD-10 Codes Code Description [phone] Other chronic osteomyelitis, left ankle and foot E11.621 Type 2 diabetes mellitus with foot ulcer L97.522 Non-pressure chronic ulcer of other part of left foot with fat layer exposed I10 Essential (primary) hypertension Facility Procedures CPT4 Code: [id number] Description: 94076 - DEB SUBQ TISSUE 20 SQ CM/< Modifier: Quantity: 1 CPT4 Code: Description: ICD-10 Diagnosis Description L97.522 Non-pressure chronic ulcer of other part of left foot with fat layer exp Modifier: osed Quantity: Physician Procedures CPT4 Code: 8088110 Description: 11042 - WC PHYS SUBQ TISS 20 SQ CM Modifier: Quantity: 1 CPT4 Code: Description: ICD-10 Diagnosis Description L97.522 Non-pressure chronic ulcer of other part of left foot with fat layer exp Modifier:  osed Quantity: Electronic Signature(s) Signed: 02/19/2020 9:49:27 AM By: Worthy Keeler PA-C Entered By: Worthy Keeler on 02/19/2020 09:49:26

## 2020-02-20 DIAGNOSIS — D631 Anemia in chronic kidney disease: Secondary | ICD-10-CM | POA: Diagnosis not present

## 2020-02-20 DIAGNOSIS — D509 Iron deficiency anemia, unspecified: Secondary | ICD-10-CM | POA: Diagnosis not present

## 2020-02-20 DIAGNOSIS — N2581 Secondary hyperparathyroidism of renal origin: Secondary | ICD-10-CM | POA: Diagnosis not present

## 2020-02-20 DIAGNOSIS — N186 End stage renal disease: Secondary | ICD-10-CM | POA: Diagnosis not present

## 2020-02-20 DIAGNOSIS — Z992 Dependence on renal dialysis: Secondary | ICD-10-CM | POA: Diagnosis not present

## 2020-02-21 ENCOUNTER — Other Ambulatory Visit: Payer: Self-pay | Admitting: Family Medicine

## 2020-02-23 DIAGNOSIS — D509 Iron deficiency anemia, unspecified: Secondary | ICD-10-CM | POA: Diagnosis not present

## 2020-02-23 DIAGNOSIS — D631 Anemia in chronic kidney disease: Secondary | ICD-10-CM | POA: Diagnosis not present

## 2020-02-23 DIAGNOSIS — N186 End stage renal disease: Secondary | ICD-10-CM | POA: Diagnosis not present

## 2020-02-23 DIAGNOSIS — Z992 Dependence on renal dialysis: Secondary | ICD-10-CM | POA: Diagnosis not present

## 2020-02-23 DIAGNOSIS — N2581 Secondary hyperparathyroidism of renal origin: Secondary | ICD-10-CM | POA: Diagnosis not present

## 2020-02-25 DIAGNOSIS — Z992 Dependence on renal dialysis: Secondary | ICD-10-CM | POA: Diagnosis not present

## 2020-02-25 DIAGNOSIS — N186 End stage renal disease: Secondary | ICD-10-CM | POA: Diagnosis not present

## 2020-02-25 DIAGNOSIS — D509 Iron deficiency anemia, unspecified: Secondary | ICD-10-CM | POA: Diagnosis not present

## 2020-02-25 DIAGNOSIS — N2581 Secondary hyperparathyroidism of renal origin: Secondary | ICD-10-CM | POA: Diagnosis not present

## 2020-02-25 DIAGNOSIS — D631 Anemia in chronic kidney disease: Secondary | ICD-10-CM | POA: Diagnosis not present

## 2020-02-27 DIAGNOSIS — Z992 Dependence on renal dialysis: Secondary | ICD-10-CM | POA: Diagnosis not present

## 2020-02-27 DIAGNOSIS — D509 Iron deficiency anemia, unspecified: Secondary | ICD-10-CM | POA: Diagnosis not present

## 2020-02-27 DIAGNOSIS — N186 End stage renal disease: Secondary | ICD-10-CM | POA: Diagnosis not present

## 2020-02-27 DIAGNOSIS — D631 Anemia in chronic kidney disease: Secondary | ICD-10-CM | POA: Diagnosis not present

## 2020-02-27 DIAGNOSIS — N2581 Secondary hyperparathyroidism of renal origin: Secondary | ICD-10-CM | POA: Diagnosis not present

## 2020-03-01 DIAGNOSIS — N186 End stage renal disease: Secondary | ICD-10-CM | POA: Diagnosis not present

## 2020-03-01 DIAGNOSIS — N2581 Secondary hyperparathyroidism of renal origin: Secondary | ICD-10-CM | POA: Diagnosis not present

## 2020-03-01 DIAGNOSIS — D509 Iron deficiency anemia, unspecified: Secondary | ICD-10-CM | POA: Diagnosis not present

## 2020-03-01 DIAGNOSIS — Z992 Dependence on renal dialysis: Secondary | ICD-10-CM | POA: Diagnosis not present

## 2020-03-01 DIAGNOSIS — D631 Anemia in chronic kidney disease: Secondary | ICD-10-CM | POA: Diagnosis not present

## 2020-03-03 DIAGNOSIS — D631 Anemia in chronic kidney disease: Secondary | ICD-10-CM | POA: Diagnosis not present

## 2020-03-03 DIAGNOSIS — D509 Iron deficiency anemia, unspecified: Secondary | ICD-10-CM | POA: Diagnosis not present

## 2020-03-03 DIAGNOSIS — N2581 Secondary hyperparathyroidism of renal origin: Secondary | ICD-10-CM | POA: Diagnosis not present

## 2020-03-03 DIAGNOSIS — Z992 Dependence on renal dialysis: Secondary | ICD-10-CM | POA: Diagnosis not present

## 2020-03-03 DIAGNOSIS — N186 End stage renal disease: Secondary | ICD-10-CM | POA: Diagnosis not present

## 2020-03-04 ENCOUNTER — Encounter: Payer: Medicare Other | Admitting: Physician Assistant

## 2020-03-04 DIAGNOSIS — M86672 Other chronic osteomyelitis, left ankle and foot: Secondary | ICD-10-CM | POA: Diagnosis not present

## 2020-03-04 DIAGNOSIS — L97522 Non-pressure chronic ulcer of other part of left foot with fat layer exposed: Secondary | ICD-10-CM | POA: Diagnosis not present

## 2020-03-04 DIAGNOSIS — I120 Hypertensive chronic kidney disease with stage 5 chronic kidney disease or end stage renal disease: Secondary | ICD-10-CM | POA: Diagnosis not present

## 2020-03-04 DIAGNOSIS — E11621 Type 2 diabetes mellitus with foot ulcer: Secondary | ICD-10-CM | POA: Diagnosis not present

## 2020-03-04 DIAGNOSIS — E1169 Type 2 diabetes mellitus with other specified complication: Secondary | ICD-10-CM | POA: Diagnosis not present

## 2020-03-04 DIAGNOSIS — E1122 Type 2 diabetes mellitus with diabetic chronic kidney disease: Secondary | ICD-10-CM | POA: Diagnosis not present

## 2020-03-04 NOTE — Progress Notes (Addendum)
Timothy Gibson, Timothy Gibson ([id number]) Visit Report for 03/04/2020 Chief Complaint Document Details Patient Name: Timothy Gibson, Timothy Gibson Date of Service: 03/04/2020 9:00 AM Medical Record Number: [id number] Patient Account Number: [id number] Date of Birth/Sex: 1950/10/26 (69 y.o. M) Treating RN: Cornell Barman Primary Care Provider: Merrie Roof Other Clinician: Referring Provider: Merrie Roof Treating Provider/Extender: Melburn Hake, Lashan Gluth Weeks in Treatment: 47 Information Obtained from: Patient Chief Complaint Left Great Toe Electronic Signature(s) Signed: 03/04/2020 9:09:53 AM By: Worthy Keeler PA-C Entered By: Worthy Keeler on 03/04/2020 09:09:53 Timothy Gibson, Timothy Gibson ([id number]) -------------------------------------------------------------------------------- Debridement Details Patient Name: Timothy Gibson Date of Service: 03/04/2020 9:00 AM Medical Record Number: [id number] Patient Account Number: [id number] Date of Birth/Sex: 07-Apr-1951 (69 y.o. M) Treating RN: Cornell Barman Primary Care Provider: Merrie Roof Other Clinician: Referring Provider: Merrie Roof Treating Provider/Extender: Melburn Hake, Tyechia Allmendinger Weeks in Treatment: 33 Debridement Performed for Wound #3 Left Toe Great Assessment: Performed By: Physician [name], Shamyra Farias E., PA-C Debridement Type: Debridement Severity of Tissue Pre Debridement: Fat layer exposed Level of Consciousness (Pre- Awake and Alert procedure): Pre-procedure Verification/Time Out Yes - 09:35 Taken: Pain Control: Lidocaine Total Area Debrided (L x W): 0.5 (cm) x 0.5 (cm) = 0.25 (cm) Tissue and other material Viable, Non-Viable, Callus, Slough, Subcutaneous, Slough debrided: Level: Skin/Subcutaneous Tissue Debridement Description: Excisional Instrument: Curette Bleeding: Minimum Hemostasis Achieved: Pressure Response to Treatment: Procedure was tolerated well Level of Consciousness (Post- Awake and Alert procedure): Post Debridement Measurements of  Total Wound Length: (cm) 0.5 Width: (cm) 0.5 Depth: (cm) 0.1 Volume: (cm) 0.02 Character of Wound/Ulcer Post Debridement: Requires Further Debridement Severity of Tissue Post Debridement: Fat layer exposed Post Procedure Diagnosis Same as Pre-procedure Electronic Signature(s) Signed: 03/04/2020 5:28:41 PM By: Worthy Keeler PA-C Signed: 03/04/2020 5:40:22 PM By: Gretta Cool, BSN, RN, CWS, Kim RN, BSN Entered By: Gretta Cool, BSN, RN, CWS, Kim on 03/04/2020 09:36:42 Timothy Gibson, Timothy Gibson ([id number]) -------------------------------------------------------------------------------- HPI Details Patient Name: Timothy Gibson Date of Service: 03/04/2020 9:00 AM Medical Record Number: [id number] Patient Account Number: [id number] Date of Birth/Sex: May 23, 1951 (69 y.o. M) Treating RN: Cornell Barman Primary Care Provider: Merrie Roof Other Clinician: Referring Provider: Merrie Roof Treating Provider/Extender: Melburn Hake, Edrian Melucci Weeks in Treatment: 105 History of Present Illness HPI Description: 11/27/17 on evaluation today patient presents for initial evaluation concerning an issue that he has been having with his right great toe which began last Thursday 11/22/17. He has a history of diabetes mellitus type to which he has had for greater than 30 years currently he is on insulin. Subsequently he also has a history of hypertension. On physical exam inspection is also appears he may have some peripheral vascular disease with his ABI's being noncompressible bilaterally. He is on dialysis as well and this is on Monday, Wednesday, and Friday. Patient was hospitalized back in January/February 2019 although this was more for stomach/back pain though they never told me exactly what was going on. This is according to the patient. Subsequently the ulcer which is between the third and fourth toe when space of the right foot as well is on the plantar aspect of the fourth toe is due to him having cleaned his toes this morning and  he states that his finger which is large split the toe tissue in between causing the wounds that we currently see. With that being said he states this has happened before I explained I would definitely recommend that he not do this any longer. He can use a small soft washcloth to clean between the  toes without causing trauma. Subsequently the right great toe hatchery does show evidence of necrotic tissue present on the surface of the wound specifically there is callous and dead skin surrounding which is trapping fluid causing problems as far as the wound is concerned. The surface of the wound does show slough although due to his vascular flow I'm not going to sharply debride this today I think I will selectively debride away the necrotic skin as well is callous from around the surrounding so this will not continue to be a moisture issue. Nonetheless the patient has no pain he does have diabetic neuropathy. 12/06/17-He is here in follow-up evaluation for a right great toe ulcer. He is voicing no complaints or concerns. He continues to infrequently/socially smokes cigars with no desire to quit. He has been compliant with offloading, using open toed surgical shoe; has been compliant using Santyl daily. He continues on clindamycin although takes it inconsistently secondary to indigestion. The plain film x-ray performed on 5/23 impression: Soft tissue swelling of the left first toe and is noted with probable irregular lucency involving distal tuft of first distal phalanx concerning for osteomyelitis. MRI may be performed for further evaluation; MRI ordered. The vascular evaluation performed on 5/24 field non- compressible ABIs bilaterally and reduced TBI bilaterally suggesting significant tibial disease. He will be referred to vascular medicine for further evaluation. 12/13/17-He is here in follow-up evaluation for right great toe ulcer. He has appointment next Thursday for the MRI and vascular evaluation.  Wound culture obtained last week grew abundant Klebsiella oxytoca (multidrug sensitivity), and abundant enteric coccus faecalis (sensitive to ampicillin). Ampicillin and Cipro were called in, along with a probiotic. In light of his appointments next Thursday he will follow-up in 2 weeks, we will continue with Santyl 12/27/17-He is here for evaluation for a right great toe ulcer. 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 He did have evaluation by vascular medicine, Dr. Ronalee Belts, on 6/13, at that appointment it was decided for him to undergo angiography with possible intervention but he refused and is still considering. If he chooses to have it done he will contact the office. He has not heard back from either ID offices that he was referred to two weeks ago: [place] and [place]. there is improvement in both appearance and measurement to the ulcer. We will extend antibiotic therapy (amoxicillin 500 every 12, Cipro 500 daily) for additional 2 weeks pending infectious disease consult, he will continue with Santyl daily. He was reminded to continue with probiotic therapy while on oral antibiotics; he denies any GI disturbance. 01/03/18-He is here in follow-up evaluation for right great toe ulcer. He is decided to not undergo any vascular intervention at this time. He was established with [facility] infectious disease (Dr. Linus Salmons) last week initiated on vancomycin and cefazolin with dialysis treatment for 6 weeks. We will continue with current treatment plan with Santyl and offloading and he will follow-up in 2 weeks 01/17/18-He is seen in follow-up evaluation for right great toe ulcer. There is improvement in appearance with less nonviable tissue present. We will continue with same treatment plan he will follow-up next week. He is tolerating IV antibiotics without any complications 12/26/48-DT is seen in follow up evaluation for  right great toe ulcer. There continues to be improvement. He is tolerating IV antibiotics with hemodialysis, completion date of 7/31. We will transition to collagen and and follow-up next week 02/07/18-He is here in  follow up for a right great toe ulcer. There is red granulation tissue throughout the wound, improved in appearance. He completed antibiotics yesterday. He saw podiatry last Thursday for nail trimming, at that appointment he periwound callus was trimmed and a culture was obtained; culture negative. He does not have a follow up appointment with infectious disease. We will submit for grafix. 02/14/18-He is here in follow-up evaluation for a right great toe ulcer. There is slow improvement, red granulation tissue throughout. The insurance approval for grafix is pending. We will continue with collagen and he'll follow-up next week 02/21/18-He is seen in follow-up evaluation for right great toe ulcer. There is improvement with red granulation tissue throughout. He was approved for oasis and this was placed today. He will follow up next week 02/28/18-He is seen in follow-up evaluation for right great toe ulcer. The wound is stable, oasis applied and he'll follow-up next week 03/07/18-He is seen in follow-up violation of her right great toe ulcer. The wound is dry, no evidence of drainage. The wound appears healed today. He was painted with Betadine, instructed to paint with Betadine daily, cover with band-aid for the next week and then cover with band-aid for the following week continue with surgical shoe. He was encouraged to contact the clinic with any evidence of drainage, or change in appearance to toe/wound. He will be discharged from wound care services Readmission: 07/14/18 on evaluation today patient presents for initial evaluation our clinic concerning issues that he is having with his left great toe. We previously saw him in 2019 for his right great toe which was actually worse at that time  and subsequently was able to be completely healed in the end although it did take several months. With that being said the patient is currently having an area on his left great toe that occurred initially as result of a blister that came up he's not really sure why or how. Subsequently this was initially treated by his podiatrist though the recommendation there was apparently amputation according to the patient and his daughter who was present with him today. With that being said the patient did not want to proceed with amputation and would like to try to perform wound care and get this to heal without having to go down that road. We were able to do that with his other toe and so he would like to at least give that a try before going forward with amputation. Again I completely understand his concern in this regard. With that being said I did discuss with the patient obviously that it's always a chance that we cannot get this to heal with normal wound care measures but obviously we will give it our best shot. He does actually have an appointment scheduled for later today with the vascular specialist in order to evaluate his blood flow. That was at the recommendation of his podiatrist as well. Obviously if he is having good arterial flow and nonetheless is still experiencing issues with having the wound delay in healing then it may simply be a matter of we need to initiate more aggressive wound to therapy in order to see if we can get this to heal. No fevers, chills, JERMONE, GEISTER. ([id number]) nausea, or vomiting noted at this time. The patient notes that this woman has been present for approximately three weeks. He is not a smoker. His most recent hemoglobin A1c was 6.2 on 06/18/19. 07/17/2019 on evaluation today patient appears unfortunately to not be doing as well.  He did see vascular I did review the note as well. That was on 07/15/2019. Subsequently there can actually be taking him to the OR for an  angiogram due to poor arterial flow into this extremity. Apparently the atherosclerotic changes were severe. The patient is stated to be at risk for limb loss. Nonetheless the good news is this is being scheduled fairly quickly he will have the surgery/procedure next Thursday. With that being said I am going also go ahead based on the results of his x-ray which showed some chance of osteomyelitis in the distal portion of the toe get the patient set up for an MRI to further evaluate for the possibility of osteomyelitis obviously if he does have osteomyelitis we need to know so that we can address this appropriately. 07/29/2019 upon evaluation today patient appears to be doing better in general with regard to his toe ulcer. He does have much better blood flow you can actually feel a palpable pulse which appears to be very strong at this time. I did review his arterial intervention/angiogram which did show that he had evidence of sufficient arterial flow restoration at this point. He did have occlusions that were 60 and 80% that residually were only 10 and 25% with no limiting flow. This is excellent news and hopefully he will continue to show signs of improvement in light of the improved vascular status. With that being said the toe mainly shows somewhat of an eschar today I think that this is fairly stable and as such probably would be best not to really do much in the way of debridement at this point I do believe Betadine would likely be a good option. 08/07/2019 on evaluation today patient actually appears to be doing about the same. The one difference is the eschar that we were just seeing if we can maintain is actually starting to loosen up and leak around the edges I think it is time to actually go ahead and remove this today. Is also no longer stable is very soft. Nonetheless the patient is okay with proceeding with debridement at this point. Fortunately there is no signs of active infection. No  fevers, chills, nausea, vomiting, or diarrhea. The patient states that he took his last antibiotic that he had last night. He would need a refill as he will not be seeing infectious disease until February 2. Nonetheless obviously I do not want him to have any break in therapy especially when he is doing as well as he is currently. 08/14/2019 on evaluation today patient appears to be doing quite well all things considered in regard to his toe. I do feel like he is making progress and though this is not completely healed by any means we still have some ways to go I feel like he is making progress which is excellent. There is no sign of active infection at this time systemically. He does have osteomyelitis of the distal phalanx of his great toe on the left. He did see Dr. Linus Salmons. That is the infectious disease doctor in [place] where he was referred. Dr. Linus Salmons apparently according to the note did not feel like the patient's MRI revealed osteomyelitis but just reactive marrow and subsequently did not recommend any antibiotic therapy whatsoever. To be peripherally honest I am not really in agreement with this based on the MRI results and what we are seeing and I think he has been responding at least decently well to oral medication as well. Nonetheless we may want to check  into the possibility of IV antibiotics being administered at the dialysis center. I will discuss this with Dr. Dellia Nims further. 08/21/2019 upon evaluation today patient appears at this point to be making some progress with regard to his wounds. He has been tolerating the dressing changes without complication. Fortunately there is no evidence of active infection at this time. Overall I feel like he is doing well with the oral antibiotics I discussed with Dr. Dellia Nims the possibility of doing IV antibiotic therapy versus continuing with the oral. His opinion was if the patient is doing well with oral to maybe stick with that. Obviously we can  initiate additional Cipro as well just to help ensure that there is nothing worsening here. The Cipro should be beneficial for him as well. That helps cover more gram-negative's were is at the doxycycline is more gram- positive's. 09/02/2019 on evaluation today patient appears to be doing fairly well upon inspection today. With that being said though the patient is continuing to do well he does also continue to develop issues with buildup of necrotic tissue on the distal portion of his toe he does have good arterial flow however. For that reason I am going to suggest that what we may need to do is consider using something such as Santyl to try to keep this free and clear. Also discussed in greater detail HBO therapy with him today please see plan for additional recommendations and results of this discussion. 09/11/2019 upon evaluation today patient appears to be doing well with regard to his toe ulcer. This actually appears to be much better today using the Santyl than it was previous. Fortunately there is no signs of active infection. No fevers, chills, nausea, vomiting, or diarrhea. 09/18/2019 upon evaluation today patient appears to be doing much better in regard to his wound in general. The Santyl was doing a good job at loosening stuff up here for [place] which is excellent news. With that being said he is still taking the oral antibiotic. I sent this in last for him on 09/03/2019. This was the doxycycline. When this runs out he will actually be complete as far as the treatment is concerned. With that being said I am getting continue the Cipro for 1 additional month. Overall his wound seems to be doing quite well. 09/25/2019 upon evaluation today patient appears to be doing well with regard to his toe ulcer. This is measuring much better and overall seems to be making good progress. Fortunately there is no signs of active infection at this time. No fevers, chills, nausea, vomiting, or diarrhea. 10/02/2019  upon evaluation today patient appears to be doing well with regard to his wound. He has been tolerating the dressing changes without complication. Fortunately there is no signs of active infection at this time. No fevers, chills, nausea, vomiting, or diarrhea. Overall I am extremely pleased with how things seem to be progressing. I think the wound bed is at the point now where we can likely proceed with additional measures to try to get this healed more quickly I think Dermagraft may be a good possibility for the patient. We can definitely look into seeing about approval for this. 10/09/2019 upon evaluation today patient's wound actually appears to be doing well although it still is progressing somewhat slowly compared to what I would like to see. With that being said I did order Apligraf for the patient which I felt would keep the wound nice and moist without allowing it to dry out and hopefully allow  this to heal much more effectively and quickly. The patient actually did obtain approval and therefore we can apply that today. 10/23/2019 upon evaluation today patient's wound actually appears to be doing significantly better with regard to his toe ulcer. The tissue at the base of the wound is greatly improved and overall I am very pleased with the progress made. We did use Apligraf 2 weeks ago on the toe that did excellent for him much it has completely dissolved after as of the 2 weeks. They only had to change the dressing a few times due to drainage through. Obviously they left the Steri-Strips and Mepitel in place. Unfortunately we do not have the Apligraf available today for application regard therefore have to use a different product here in the clinic today and then order the Apligraf for next Thursday. That will be his second application. 4/22; patient arrived with overhanging skin and subcutaneous tissue on the wound margins this was removed. Apligraf #2 applied 11/13/2019 upon evaluation today  patient actually appears to be doing well with regard to his foot ulcer. He has been tolerating the dressing changes without complication. The overall wound bed appears to be healthier and has filled in as well as not nearly as deep as it was the tissue is also better quality. Overall I am extremely pleased with how things seem to be progressing. The patient is likewise very happy in this regard. 5/20; wound is measuring smaller had Apligraf #3 last time. Apligraf #4 today Timothy Gibson, Timothy Gibson ([id number]) 12/12/2019 upon evaluation today patient actually appears to be doing quite well with regard to his wound in my opinion. The ulcer is significantly smaller compared to last time I saw him although that has been several weeks. I do believe the Apligraf has been beneficial he has 1 remaining today this will be application #5. 6/96/7893 upon evaluation today patient appears to be doing well with regard to his toe ulcer. He is measuring about the same today but he has come a long way from where this started. Fortunately there is no evidence of active infection which is great news and overall I am very pleased with the progress that is been made. Nonetheless at this point we have completed the Apligraf course he did see improvement through this but I am going to switch to something different as of today. 01/08/2020 upon evaluation today patient appears to be doing much better in regards to his toe ulcer this is measuring smaller and overall very pleased with the progress. The Hydrofera Blue seems to be doing a great job. Fortunately there is no evidence of active infection at this time. No fevers, chills, nausea, vomiting, or diarrhea. 01/29/2020 upon evaluation today patient appears to be doing okay in regard to his toe ulcer. The main issue I see is that he actually has trouble right now with callus growing and covering over the wound bed. This prevents him from being able to get a dressing in contact with  the wound bed and subsequently fluid collection underneath. I think that we probably need to see him more frequently to keep this pared down and debrided to the point to allow the dressings to effectively heal this wound. I discussed that with the patient today. 02/05/2020 on evaluation today patient appears to be doing well with regard to his wounds currently. In fact he just has 1 on his toe remaining at this point he seems to be doing quite well in my opinion. I am very pleased with  the overall appearance today. There is no signs of active infection I do think keeping the callus trimmed down has been of great benefit as far as healing is concerned. I do not think we need to go 3 weeks out like we were in the past I believe that is been somewhat more detrimental than good for him. 02/19/2020 upon evaluation today patient appears to be doing better in regard to his toe ulcer still there is callus buildup around the top of the toe which unfortunately is I think causing some issues here with trapping a little fluid underneath them after cleaning this away. Fortunately there is no evidence of active infection at this time. Overall I feel like the patient is doing quite well which is great news. 03/04/2020 on evaluation today patient appears to be doing well with regard to his toe ulcer though he still has a lot of drying out of the endoform and a lot of callus buildup as well unfortunately. With that being said I think that he is can require some sharp debridement to clear away some of the callus today. We will see what the wound looks like following. Electronic Signature(s) Signed: 03/04/2020 12:50:49 PM By: Worthy Keeler PA-C Entered By: Worthy Keeler on 03/04/2020 12:50:48 Timothy Gibson, Timothy Gibson ([id number]) -------------------------------------------------------------------------------- Physical Exam Details Patient Name: Timothy Gibson Date of Service: 03/04/2020 9:00 AM Medical Record Number:  [id number] Patient Account Number: [id number] Date of Birth/Sex: [birth year] (69 y.o. M) Treating RN: Cornell Barman Primary Care Provider: Merrie Roof Other Clinician: Referring Provider: Merrie Roof Treating Provider/Extender: Melburn Hake, Donell Sliwinski Weeks in Treatment: 24 Constitutional Well-nourished and well-hydrated in no acute distress. Respiratory normal breathing without difficulty. Psychiatric this patient is able to make decisions and demonstrates good insight into disease process. Alert and Oriented x 3. pleasant and cooperative. Notes On inspection patient's wound bed actually showed signs of good granulation at this time once I was able to remove the callus and slough from the surface. I believe feel like the endoform is really not helping at this point is just trapping fluid underneath and not allowing this to actually heal. I think you may do better with a silver alginate currently. Obviously we want to do what we can to try to get this healed and closed as quickly as possible. Electronic Signature(s) Signed: 03/04/2020 12:51:17 PM By: Worthy Keeler PA-C Entered By: Worthy Keeler on 03/04/2020 12:51:16 Timothy Gibson ([id number]) -------------------------------------------------------------------------------- Physician Orders Details Patient Name: Timothy Gibson Date of Service: 03/04/2020 9:00 AM Medical Record Number: [id number] Patient Account Number: [id number] Date of Birth/Sex: July 07, 1951 (69 y.o. 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 Anesthetic (add to Medication List) Wound #3 Left Toe Great o Topical Lidocaine 4% cream applied to wound bed prior to debridement (In Clinic Only). Primary Wound Dressing Wound #3 Left Toe Great o Silver Alginate Secondary Dressing Wound #3 Left Toe Great o Conform/Kerlix Dressing Change  Frequency Wound #3 Left Toe Great o Other: - As needed Follow-up Appointments Wound #3 Left Toe Great o Return Appointment in 1 week. Off-Loading Wound #3 Left Toe Great o Open toe surgical shoe with peg assist. - left foot Electronic Signature(s) Signed: 03/04/2020 5:28:41 PM By: Worthy Keeler PA-C Signed: 03/04/2020 5:40:22 PM By: Gretta Cool, BSN, RN, CWS, Kim RN, BSN Entered By: Gretta Cool, BSN, RN, CWS, Kim on 03/04/2020 09:38:58 Timothy Gibson ([id number]) -------------------------------------------------------------------------------- Problem List Details Patient Name: Timothy Gibson Date of Service: 03/04/2020 9:00 AM Medical Record Number: [id number] Patient Account Number: [id number] Date of Birth/Sex: 01-26-1951 (69 y.o. M) Treating RN: Cornell Barman Primary Care Provider: Merrie Roof Other Clinician: Referring Provider: Merrie Roof Treating Provider/Extender: Melburn Hake, Tamila Gaulin Weeks in Treatment: 65 Active Problems ICD-10 Encounter Code Description Active Date MDM Diagnosis [phone] Other chronic osteomyelitis, left ankle and foot 07/29/2019 No Yes E11.621 Type 2 diabetes mellitus with foot ulcer 07/15/2019 No Yes L97.522 Non-pressure chronic ulcer of other part of left foot with fat layer 07/15/2019 No Yes exposed [place] (primary) hypertension 07/15/2019 No Yes Inactive Problems Resolved Problems Electronic Signature(s) Signed: 03/04/2020 9:09:39 AM By: Worthy Keeler PA-C Entered By: Worthy Keeler on 03/04/2020 09:09:38 Timothy Gibson ([id number]) -------------------------------------------------------------------------------- Progress Note Details Patient Name: Timothy Gibson Date of Service: 03/04/2020 9:00 AM Medical Record Number: [id number] Patient Account Number: [id number] Date of Birth/Sex: [birth year] (69 y.o. M) Treating RN: Cornell Barman Primary Care Provider: Merrie Roof Other Clinician: Referring Provider: Merrie Roof Treating Provider/Extender: Melburn Hake, Tulsi Crossett Weeks in Treatment: 70 Subjective Chief Complaint Information obtained from Patient Left Great Toe History of Present Illness (HPI) 11/27/17 on evaluation today patient presents for initial evaluation concerning an issue that he has been having with his right great toe which began last Thursday 11/22/17. He has a history of diabetes mellitus type to which he has had for greater than 30 years currently he is on insulin. Subsequently he also has a history of hypertension. On physical exam inspection is also appears he may have some peripheral vascular disease with his ABI's being noncompressible bilaterally. He is on dialysis as well and this is on Monday, Wednesday, and Friday. Patient was hospitalized back in January/February 2019 although this was more for stomach/back pain though they never told me exactly what was going on. This is according to the patient. Subsequently the ulcer which is between the third and fourth toe when space of the right foot as well is on the plantar aspect of the fourth toe is due to him having cleaned his toes this morning and he states that his finger which is large split the toe tissue in between causing the wounds that we currently see. With that being said he states this has happened before I explained I would definitely recommend that he not do this any longer. He can use a small soft washcloth to clean between the toes without causing  trauma. Subsequently the right great toe hatchery does show evidence of necrotic tissue present on the surface of the wound specifically there is callous and dead skin surrounding which is trapping fluid causing problems as far as the wound is concerned. The surface of the wound does show slough although due to his vascular flow I'm not going to sharply debride this today I think I will selectively debride away the necrotic skin as well is callous from around the surrounding so this will not continue to be a moisture issue. Nonetheless the patient has  no pain he does have diabetic neuropathy. 12/06/17-He is here in follow-up evaluation for a right great toe ulcer. He is voicing no complaints or concerns. He continues to infrequently/socially smokes cigars with no desire to quit. He has been compliant with offloading, using open toed surgical shoe; has been compliant using Santyl daily. He continues on clindamycin although takes it inconsistently secondary to indigestion. The plain film x-ray performed on 5/23 impression: Soft tissue swelling of the left first toe and is noted with probable irregular lucency involving distal tuft of first distal phalanx concerning for osteomyelitis. MRI may be performed for further evaluation; MRI ordered. The vascular evaluation performed on 5/24 field non- compressible ABIs bilaterally and reduced TBI bilaterally suggesting significant tibial disease. He will be referred to vascular medicine for further evaluation. 12/13/17-He is here in follow-up evaluation for right great toe ulcer. He has appointment next Thursday for the MRI and vascular evaluation. Wound culture obtained last week grew abundant Klebsiella oxytoca (multidrug sensitivity), and abundant enteric coccus faecalis (sensitive to ampicillin). Ampicillin and Cipro were called in, along with a probiotic. In light of his appointments next Thursday he will follow-up in 2 weeks, we will continue with  Santyl 12/27/17-He is here for evaluation for a right great toe ulcer. 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 He did have evaluation by vascular medicine, Dr. Ronalee Belts, on 6/13, at that appointment it was decided for him to undergo angiography with possible intervention but he refused and is still considering. If he chooses to have it done he will contact the office. He has not heard back from either ID offices that he was referred to two weeks ago: [place] and [place]. there is improvement in both appearance and measurement to the ulcer. We will extend antibiotic therapy (amoxicillin 500 every 12, Cipro 500 daily) for additional 2 weeks pending infectious disease consult, he will continue with Santyl daily. He was reminded to continue with probiotic therapy while on oral antibiotics; he denies any GI disturbance. 01/03/18-He is here in follow-up evaluation for right great toe ulcer. He is decided to not undergo any vascular intervention at this time. He was established with [facility] infectious disease (Dr. Linus Salmons) last week initiated on vancomycin and cefazolin with dialysis treatment for 6 weeks. We will continue with current treatment plan with Santyl and offloading and he will follow-up in 2 weeks 01/17/18-He is seen in follow-up evaluation for right great toe ulcer. There is improvement in appearance with less nonviable tissue present. We will continue with same treatment plan he will follow-up next week. He is tolerating IV antibiotics without any complications 8/36/62-HU is seen in follow up evaluation for right great toe ulcer. There continues to be improvement. He is tolerating IV antibiotics with hemodialysis, completion date of 7/31. We will transition to collagen and and follow-up next week 02/07/18-He is here in follow up for a right great toe ulcer. There is red granulation tissue throughout the wound, improved  in appearance. He completed antibiotics yesterday. He saw podiatry last Thursday for nail trimming, at that appointment he periwound callus was trimmed and a culture was obtained; culture negative. He does not have a follow up appointment with infectious disease. We will submit for grafix. 02/14/18-He is here in follow-up evaluation for a right great toe ulcer. There is slow improvement, red granulation tissue throughout. The insurance approval for grafix is pending. We will continue with collagen and he'll follow-up next  week 02/21/18-He is seen in follow-up evaluation for right great toe ulcer. There is improvement with red granulation tissue throughout. He was approved for oasis and this was placed today. He will follow up next week 02/28/18-He is seen in follow-up evaluation for right great toe ulcer. The wound is stable, oasis applied and he'll follow-up next week 03/07/18-He is seen in follow-up violation of her right great toe ulcer. The wound is dry, no evidence of drainage. The wound appears healed today. He was painted with Betadine, instructed to paint with Betadine daily, cover with band-aid for the next week and then cover with band-aid for the following week continue with surgical shoe. He was encouraged to contact the clinic with any evidence of drainage, or change in appearance to toe/wound. He will be discharged from wound care services Readmission: 07/14/18 on evaluation today patient presents for initial evaluation our clinic concerning issues that he is having with his left great toe. We previously saw him in 2019 for his right great toe which was actually worse at that time and subsequently was able to be completely healed in the end although it did take several months. With that being said the patient is currently having an area on his left great toe that occurred initially as result of a blister that came up he's not really sure why or how. Subsequently this was initially treated by  his podiatrist though the recommendation there was apparently amputation according to the patient and his daughter who was present with him today. With that being said the patient did not want to proceed with amputation and would like to try to perform wound care and get this to heal without having to go down that road. We were able to do that with his other toe and so he would like to at least give that a try before going forward with amputation. Timothy Gibson, Timothy Gibson ([id number]) Again I completely understand his concern in this regard. With that being said I did discuss with the patient obviously that it's always a chance that we cannot get this to heal with normal wound care measures but obviously we will give it our best shot. He does actually have an appointment scheduled for later today with the vascular specialist in order to evaluate his blood flow. That was at the recommendation of his podiatrist as well. Obviously if he is having good arterial flow and nonetheless is still experiencing issues with having the wound delay in healing then it may simply be a matter of we need to initiate more aggressive wound to therapy in order to see if we can get this to heal. No fevers, chills, nausea, or vomiting noted at this time. The patient notes that this woman has been present for approximately three weeks. He is not a smoker. His most recent hemoglobin A1c was 6.2 on 06/18/19. 07/17/2019 on evaluation today patient appears unfortunately to not be doing as well. He did see vascular I did review the note as well. That was on 07/15/2019. Subsequently there can actually be taking him to the OR for an angiogram due to poor arterial flow into this extremity. Apparently the atherosclerotic changes were severe. The patient is stated to be at risk for limb loss. Nonetheless the good news is this is being scheduled fairly quickly he will have the surgery/procedure next Thursday. With that being said I am going also go  ahead based on the results of his x-ray which showed some chance of osteomyelitis in the distal  portion of the toe get the patient set up for an MRI to further evaluate for the possibility of osteomyelitis obviously if he does have osteomyelitis we need to know so that we can address this appropriately. 07/29/2019 upon evaluation today patient appears to be doing better in general with regard to his toe ulcer. He does have much better blood flow you can actually feel a palpable pulse which appears to be very strong at this time. I did review his arterial intervention/angiogram which did show that he had evidence of sufficient arterial flow restoration at this point. He did have occlusions that were 60 and 80% that residually were only 10 and 25% with no limiting flow. This is excellent news and hopefully he will continue to show signs of improvement in light of the improved vascular status. With that being said the toe mainly shows somewhat of an eschar today I think that this is fairly stable and as such probably would be best not to really do much in the way of debridement at this point I do believe Betadine would likely be a good option. 08/07/2019 on evaluation today patient actually appears to be doing about the same. The one difference is the eschar that we were just seeing if we can maintain is actually starting to loosen up and leak around the edges I think it is time to actually go ahead and remove this today. Is also no longer stable is very soft. Nonetheless the patient is okay with proceeding with debridement at this point. Fortunately there is no signs of active infection. No fevers, chills, nausea, vomiting, or diarrhea. The patient states that he took his last antibiotic that he had last night. He would need a refill as he will not be seeing infectious disease until February 2. Nonetheless obviously I do not want him to have any break in therapy especially when he is doing as well as he is  currently. 08/14/2019 on evaluation today patient appears to be doing quite well all things considered in regard to his toe. I do feel like he is making progress and though this is not completely healed by any means we still have some ways to go I feel like he is making progress which is excellent. There is no sign of active infection at this time systemically. He does have osteomyelitis of the distal phalanx of his great toe on the left. He did see Dr. Linus Salmons. That is the infectious disease doctor in [place] where he was referred. Dr. Linus Salmons apparently according to the note did not feel like the patient's MRI revealed osteomyelitis but just reactive marrow and subsequently did not recommend any antibiotic therapy whatsoever. To be peripherally honest I am not really in agreement with this based on the MRI results and what we are seeing and I think he has been responding at least decently well to oral medication as well. Nonetheless we may want to check into the possibility of IV antibiotics being administered at the dialysis center. I will discuss this with Dr. Dellia Nims further. 08/21/2019 upon evaluation today patient appears at this point to be making some progress with regard to his wounds. He has been tolerating the dressing changes without complication. Fortunately there is no evidence of active infection at this time. Overall I feel like he is doing well with the oral antibiotics I discussed with Dr. Dellia Nims the possibility of doing IV antibiotic therapy versus continuing with the oral. His opinion was if the patient is doing well with  oral to maybe stick with that. Obviously we can initiate additional Cipro as well just to help ensure that there is nothing worsening here. The Cipro should be beneficial for him as well. That helps cover more gram-negative's were is at the doxycycline is more gram- positive's. 09/02/2019 on evaluation today patient appears to be doing fairly well upon inspection  today. With that being said though the patient is continuing to do well he does also continue to develop issues with buildup of necrotic tissue on the distal portion of his toe he does have good arterial flow however. For that reason I am going to suggest that what we may need to do is consider using something such as Santyl to try to keep this free and clear. Also discussed in greater detail HBO therapy with him today please see plan for additional recommendations and results of this discussion. 09/11/2019 upon evaluation today patient appears to be doing well with regard to his toe ulcer. This actually appears to be much better today using the Santyl than it was previous. Fortunately there is no signs of active infection. No fevers, chills, nausea, vomiting, or diarrhea. 09/18/2019 upon evaluation today patient appears to be doing much better in regard to his wound in general. The Santyl was doing a good job at loosening stuff up here for [place] which is excellent news. With that being said he is still taking the oral antibiotic. I sent this in last for him on 09/03/2019. This was the doxycycline. When this runs out he will actually be complete as far as the treatment is concerned. With that being said I am getting continue the Cipro for 1 additional month. Overall his wound seems to be doing quite well. 09/25/2019 upon evaluation today patient appears to be doing well with regard to his toe ulcer. This is measuring much better and overall seems to be making good progress. Fortunately there is no signs of active infection at this time. No fevers, chills, nausea, vomiting, or diarrhea. 10/02/2019 upon evaluation today patient appears to be doing well with regard to his wound. He has been tolerating the dressing changes without complication. Fortunately there is no signs of active infection at this time. No fevers, chills, nausea, vomiting, or diarrhea. Overall I am extremely pleased with how things seem to be  progressing. I think the wound bed is at the point now where we can likely proceed with additional measures to try to get this healed more quickly I think Dermagraft may be a good possibility for the patient. We can definitely look into seeing about approval for this. 10/09/2019 upon evaluation today patient's wound actually appears to be doing well although it still is progressing somewhat slowly compared to what I would like to see. With that being said I did order Apligraf for the patient which I felt would keep the wound nice and moist without allowing it to dry out and hopefully allow this to heal much more effectively and quickly. The patient actually did obtain approval and therefore we can apply that today. 10/23/2019 upon evaluation today patient's wound actually appears to be doing significantly better with regard to his toe ulcer. The tissue at the base of the wound is greatly improved and overall I am very pleased with the progress made. We did use Apligraf 2 weeks ago on the toe that did excellent for him much it has completely dissolved after as of the 2 weeks. They only had to change the dressing a few times due  to drainage through. Obviously they left the Steri-Strips and Mepitel in place. Unfortunately we do not have the Apligraf available today for application regard therefore have to use a different product here in the clinic today and then order the Apligraf for next Thursday. That will be his second application. 4/22; patient arrived with overhanging skin and subcutaneous tissue on the wound margins this was removed. Apligraf #2 applied Timothy Gibson, Timothy Gibson ([id number]) 11/13/2019 upon evaluation today patient actually appears to be doing well with regard to his foot ulcer. He has been tolerating the dressing changes without complication. The overall wound bed appears to be healthier and has filled in as well as not nearly as deep as it was the tissue is also better quality. Overall I am  extremely pleased with how things seem to be progressing. The patient is likewise very happy in this regard. 5/20; wound is measuring smaller had Apligraf #3 last time. Apligraf #4 today 12/12/2019 upon evaluation today patient actually appears to be doing quite well with regard to his wound in my opinion. The ulcer is significantly smaller compared to last time I saw him although that has been several weeks. I do believe the Apligraf has been beneficial he has 1 remaining today this will be application #5. 9/38/1829 upon evaluation today patient appears to be doing well with regard to his toe ulcer. He is measuring about the same today but he has come a long way from where this started. Fortunately there is no evidence of active infection which is great news and overall I am very pleased with the progress that is been made. Nonetheless at this point we have completed the Apligraf course he did see improvement through this but I am going to switch to something different as of today. 01/08/2020 upon evaluation today patient appears to be doing much better in regards to his toe ulcer this is measuring smaller and overall very pleased with the progress. The Hydrofera Blue seems to be doing a great job. Fortunately there is no evidence of active infection at this time. No fevers, chills, nausea, vomiting, or diarrhea. 01/29/2020 upon evaluation today patient appears to be doing okay in regard to his toe ulcer. The main issue I see is that he actually has trouble right now with callus growing and covering over the wound bed. This prevents him from being able to get a dressing in contact with the wound bed and subsequently fluid collection underneath. I think that we probably need to see him more frequently to keep this pared down and debrided to the point to allow the dressings to effectively heal this wound. I discussed that with the patient today. 02/05/2020 on evaluation today patient appears to be doing  well with regard to his wounds currently. In fact he just has 1 on his toe remaining at this point he seems to be doing quite well in my opinion. I am very pleased with the overall appearance today. There is no signs of active infection I do think keeping the callus trimmed down has been of great benefit as far as healing is concerned. I do not think we need to go 3 weeks out like we were in the past I believe that is been somewhat more detrimental than good for him. 02/19/2020 upon evaluation today patient appears to be doing better in regard to his toe ulcer still there is callus buildup around the top of the toe which unfortunately is I think causing some issues here with trapping a  little fluid underneath them after cleaning this away. Fortunately there is no evidence of active infection at this time. Overall I feel like the patient is doing quite well which is great news. 03/04/2020 on evaluation today patient appears to be doing well with regard to his toe ulcer though he still has a lot of drying out of the endoform and a lot of callus buildup as well unfortunately. With that being said I think that he is can require some sharp debridement to clear away some of the callus today. We will see what the wound looks like following. Objective Constitutional Well-nourished and well-hydrated in no acute distress. Vitals Time Taken: 8:50 AM, Height: 76 in, Weight: 244 lbs, BMI: 29.7, Temperature: 98.2 F, Pulse: 71 bpm, Respiratory Rate: 18 breaths/min, Blood Pressure: 132/70 mmHg. Respiratory normal breathing without difficulty. Psychiatric this patient is able to make decisions and demonstrates good insight into disease process. Alert and Oriented x 3. pleasant and cooperative. General Notes: On inspection patient's wound bed actually showed signs of good granulation at this time once I was able to remove the callus and slough from the surface. I believe feel like the endoform is really not  helping at this point is just trapping fluid underneath and not allowing this to actually heal. I think you may do better with a silver alginate currently. Obviously we want to do what we can to try to get this healed and closed as quickly as possible. Integumentary (Hair, Skin) Wound #3 status is Open. Original cause of wound was Gradually Appeared. The wound is located on the Left Toe Great. The wound measures 0.5cm length x 0.5cm width x 0.1cm depth; 0.196cm^2 area and 0.02cm^3 volume. There is Fat Layer (Subcutaneous Tissue) exposed. There is a medium amount of serous drainage noted. The wound margin is thickened. There is medium (34-66%) pink granulation within the wound bed. There is a medium (34-66%) amount of necrotic tissue within the wound bed including Adherent Slough. 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 There was a Excisional Skin/Subcutaneous Tissue Debridement with a total area of 0.25 sq cm performed by [name], Latresha Yahr E., PA-C. With the following instrument(s): Curette to remove Viable and Non-Viable tissue/material. Material removed includes Callus, Subcutaneous Tissue, and Slough after achieving pain control using Lidocaine. No specimens were taken. A time out was conducted at 09:35, prior to the start of the procedure. A Minimum amount of bleeding was controlled with Pressure. The procedure was tolerated well. Post Debridement Measurements: 0.5cm length x 0.5cm width x 0.1cm depth; 0.02cm^3 volume. Character of Wound/Ulcer Post Debridement requires further  debridement. Severity of Tissue Post Debridement is: Fat layer exposed. Post procedure Diagnosis Wound #3: Same as Pre-Procedure Plan Wound Cleansing: Wound #3 Left Toe Great: Dial antibacterial soap, wash wounds, rinse and pat dry prior to dressing wounds May Shower, gently pat wound dry prior to applying new dressing. Anesthetic (add to Medication List): Wound #3 Left Toe Great: Topical Lidocaine 4% cream applied to wound bed prior to debridement (In Clinic Only). Primary Wound Dressing: Wound #3 Left Toe Great: Silver Alginate Secondary Dressing: Wound #3 Left Toe Great: Conform/Kerlix Dressing  Change Frequency: Wound #3 Left Toe Great: Other: - As needed Follow-up Appointments: Wound #3 Left Toe Great: Return Appointment in 1 week. Off-Loading: Wound #3 Left Toe Great: Open toe surgical shoe with peg assist. - left foot 1. I would recommend at this time that the patient actually continue with changing this dressing every other day although I would recommend silver alginate as opposed to the endoform hopefully this will do that in. 2. Also would recommend that the patient continue to monitor for any signs of infection or worsening right now I think he is doing quite well if we can just prevent this from trapping fluid with regard to the dressing. We will see patient back for reevaluation in 1 week here in the clinic. If anything worsens or changes patient will contact our office for additional recommendations. Electronic Signature(s) Signed: 03/04/2020 1:30:51 PM By: Worthy Keeler PA-C Entered By: Worthy Keeler on 03/04/2020 13:30:50 Timothy Gibson ([id number]) -------------------------------------------------------------------------------- SuperBill Details Patient Name: Timothy Gibson Date of Service: 03/04/2020 Medical Record Number: [id number] Patient Account Number: [id number] Date of Birth/Sex: [birth year] (69 y.o. M) Treating RN: Cornell Barman Primary Care  Provider: Merrie Roof Other Clinician: Referring Provider: Merrie Roof Treating Provider/Extender: Melburn Hake, Felicita Nuncio Weeks in Treatment: 33 Diagnosis Coding ICD-10 Codes Code Description [phone] Other chronic osteomyelitis, left ankle and foot E11.621 Type 2 diabetes mellitus with foot ulcer L97.522 Non-pressure chronic ulcer of other part of left foot with fat layer exposed I10 Essential (primary) hypertension Facility Procedures CPT4 Code: 81103159 Description: 45859 - DEB SUBQ TISSUE 20 SQ CM/< Modifier: Quantity: 1 CPT4 Code: Description: ICD-10 Diagnosis Description L97.522 Non-pressure chronic ulcer of other part of left foot with fat layer exp Modifier: osed Quantity: Physician Procedures CPT4 Code: 2924462 Description: 11042 - WC PHYS SUBQ TISS 20 SQ CM Modifier: Quantity: 1 CPT4 Code: Description: ICD-10 Diagnosis Description L97.522 Non-pressure chronic ulcer of other part of left foot with fat layer exp Modifier: osed Quantity: Electronic Signature(s) Signed: 03/04/2020 1:31:12 PM By: Worthy Keeler PA-C Entered By: Worthy Keeler on 03/04/2020 13:31:11

## 2020-03-05 DIAGNOSIS — D509 Iron deficiency anemia, unspecified: Secondary | ICD-10-CM | POA: Diagnosis not present

## 2020-03-05 DIAGNOSIS — D631 Anemia in chronic kidney disease: Secondary | ICD-10-CM | POA: Diagnosis not present

## 2020-03-05 DIAGNOSIS — Z992 Dependence on renal dialysis: Secondary | ICD-10-CM | POA: Diagnosis not present

## 2020-03-05 DIAGNOSIS — N186 End stage renal disease: Secondary | ICD-10-CM | POA: Diagnosis not present

## 2020-03-05 DIAGNOSIS — N2581 Secondary hyperparathyroidism of renal origin: Secondary | ICD-10-CM | POA: Diagnosis not present

## 2020-03-05 NOTE — Progress Notes (Signed)
ENMANUEL, ZUFALL ([id number]) Visit Report for 03/04/2020 Arrival Information Details Patient Name: Timothy Gibson, Timothy Gibson Date of Service: 03/04/2020 9:00 AM Medical Record Number: [id number] Patient Account Number: [id number] Date of Birth/Sex: April 21, 1951 (69 y.o. M) Treating RN: Cornell Barman Primary Care Tymesha Ditmore: Merrie Roof Other Clinician: Referring Amna Welker: Merrie Roof Treating Armonee Bojanowski/Extender: Melburn Hake, [name] Weeks in Treatment: 67 Visit Information History Since Last Visit All ordered tests and consults were completed: No Patient Arrived: Ambulatory Added or deleted any medications: No Arrival Time: 09:08 Any new allergies or adverse reactions: No Accompanied By: self Had a fall or experienced change in No Transfer Assistance: None activities of daily living that may affect Patient Identification Verified: Yes risk of falls: Secondary Verification Process Completed: Yes Signs or symptoms of abuse/neglect since last visito No Patient Requires Transmission-Based Precautions: No Hospitalized since last visit: No Patient Has Alerts: No Implantable device outside of the clinic excluding No cellular tissue based products placed in the center since last visit: Has Dressing in Place as Prescribed: Yes Pain Present Now: No Electronic Signature(s) Signed: 03/04/2020 12:28:02 PM By: Darci Needle Entered By: Darci Needle on 03/04/2020 09:09:05 Timothy Gibson ([id number]) -------------------------------------------------------------------------------- Encounter Discharge Information Details Patient Name: Timothy Gibson Date of Service: 03/04/2020 9:00 AM Medical Record Number: [id number] Patient Account Number: [id number] Date of Birth/Sex: 05-Jan-1951 (69 y.o. M) Treating RN: Cornell Barman Primary Care Kha Hari: Merrie Roof Other Clinician: Referring Slevin Gunby: Merrie Roof Treating Jaime Dome/Extender: Melburn Hake, [name] Weeks in Treatment: 33 Encounter Discharge  Information Items Post Procedure Vitals Discharge Condition: Stable Temperature (F): 98.2 Ambulatory Status: Cane Pulse (bpm): 71 Discharge Destination: Home Respiratory Rate (breaths/min): 18 Transportation: Private Auto Blood Pressure (mmHg): 132/70 Accompanied By: self Schedule Follow-up Appointment: Yes Clinical Summary of Care: Electronic Signature(s) Signed: 03/04/2020 5:40:22 PM By: Gretta Cool, BSN, RN, CWS, Kim RN, BSN Entered By: Gretta Cool, BSN, RN, CWS, Kim on 03/04/2020 09:40:47 Timothy Gibson ([id number]) -------------------------------------------------------------------------------- Lower Extremity Assessment Details Patient Name: Timothy Gibson Date of Service: 03/04/2020 9:00 AM Medical Record Number: [id number] Patient Account Number: [id number] Date of Birth/Sex: [birth year] (69 y.o. M) Treating RN: Cornell Barman Primary Care Rhyann Berton: Merrie Roof Other Clinician: Referring Daichi Moris: Merrie Roof Treating Telecia Larocque/Extender: Melburn Hake, [name] Weeks in Treatment: 33 Edema Assessment Assessed: [Left: No] [Right: Yes] Edema: [Left: N] [Right: o] Vascular Assessment Pulses: Dorsalis Pedis Palpable: [Right:Yes] Posterior Tibial Palpable: [Right:Yes] Electronic Signature(s) Signed: 03/04/2020 12:28:02 PM By: Darci Needle Signed: 03/04/2020 5:40:22 PM By: Gretta Cool, BSN, RN, CWS, Kim RN, BSN Entered By: Darci Needle on 03/04/2020 09:11:36 JIAN, HODGMAN ([id number]) -------------------------------------------------------------------------------- Multi Wound Chart Details Patient Name: Timothy Gibson Date of Service: 03/04/2020 9:00 AM Medical Record Number: [id number] Patient Account Number: [id number] Date of Birth/Sex: Dec 12, 1950 (69 y.o. M) Treating RN: Cornell Barman Primary Care Jacksen Isip: Merrie Roof Other Clinician: Referring Annaliyah Willig: Merrie Roof Treating Kesean Serviss/Extender: Melburn Hake, [name] Weeks in Treatment: 33 Vital Signs Height(in): 76 Pulse(bpm):  71 Weight(lbs): 244 Blood Pressure(mmHg): 132/70 Body Mass Index(BMI): 30 Temperature(F): 98.2 Respiratory Rate(breaths/min): 18 Photos: [N/A:N/A] Wound Location: Left Toe Great N/A N/A Wounding Event: Gradually Appeared N/A N/A Primary Etiology: Diabetic Wound/Ulcer of the Lower N/A N/A Extremity Comorbid History: Lymphedema, Arrhythmia, N/A N/A Hypertension, Type II Diabetes, End Stage Renal Disease, Rheumatoid Arthritis, Osteoarthritis, Confinement Anxiety Date Acquired: 06/19/2019 N/A N/A Weeks of Treatment: 33 N/A N/A Wound Status: Open N/A N/A Measurements L x W x D (cm) 0.5x0.5x0.1 N/A N/A Area (cm) : 0.196 N/A N/A Volume (cm) : 0.02 N/A N/A % Reduction in Area:  92.80% N/A N/A % Reduction in Volume: 92.60% N/A N/A Classification: Grade 2 N/A N/A Exudate Amount: Medium N/A N/A Exudate Type: Serous N/A N/A Exudate Color: amber N/A N/A Wound Margin: Thickened N/A N/A Granulation Amount: Medium (34-66%) N/A N/A Granulation Quality: Pink N/A N/A Necrotic Amount: Medium (34-66%) N/A N/A Exposed Structures: Fat Layer (Subcutaneous Tissue): N/A N/A Yes Fascia: No Tendon: No Muscle: No Joint: No Bone: No Epithelialization: Small (1-33%) N/A N/A Treatment Notes Electronic Signature(s) Signed: 03/04/2020 5:40:22 PM By: Gretta Cool, BSN, RN, CWS, Kim RN, BSN Entered By: Gretta Cool, BSN, RN, CWS, Kim on 03/04/2020 09:35:18 MARCELLAS, MARCHANT ([id number]) MACINTYRE, ALEXA ([id number]) -------------------------------------------------------------------------------- Kathryn Details Patient Name: Timothy Gibson Date of Service: 03/04/2020 9:00 AM Medical Record Number: [id number] Patient Account Number: [id number] Date of Birth/Sex: [birth year] (69 y.o. M) Treating RN: Cornell Barman Primary Care Terell Kincy: Merrie Roof Other Clinician: Referring Keyah Blizard: Merrie Roof Treating Ameliah Baskins/Extender: Melburn Hake, [name] Weeks in Treatment: 55 Active Inactive Abuse /  Safety / Falls / Self Care Management Nursing Diagnoses: Potential for falls Goals: Patient will not experience any injury related to falls Date Initiated: 07/15/2019 Target Resolution Date: 10/18/2019 Goal Status: Active Interventions: Assess fall risk on admission and as needed Notes: Necrotic Tissue Nursing Diagnoses: Impaired tissue integrity related to necrotic/devitalized tissue Goals: Necrotic/devitalized tissue will be minimized in the wound bed Date Initiated: 07/15/2019 Target Resolution Date: 10/18/2019 Goal Status: Active Interventions: Provide education on necrotic tissue and debridement process Notes: Orientation to the Wound Care Program Nursing Diagnoses: Knowledge deficit related to the wound healing center program Goals: Patient/caregiver will verbalize understanding of the [facility] Date Initiated: 07/15/2019 Target Resolution Date: 10/18/2019 Goal Status: Active Interventions: Provide education on orientation to the wound center Notes: Peripheral Neuropathy Nursing Diagnoses: Knowledge deficit related to disease process and management of peripheral neurovascular dysfunction Goals: Patient/caregiver will verbalize understanding of disease process and disease management Date Initiated: 07/15/2019 Target Resolution Date: 10/18/2019 Goal Status: Active DEVON, PRETTY ([id number]) Interventions: Assess signs and symptoms of neuropathy upon admission and as needed Provide education on Management of Neuropathy and Related Ulcers Notes: Wound/Skin Impairment Nursing Diagnoses: Impaired tissue integrity Goals: Ulcer/skin breakdown will heal within 14 weeks Date Initiated: 07/15/2019 Target Resolution Date: 10/18/2019 Goal Status: Active Interventions: Assess patient/caregiver ability to obtain necessary supplies Assess patient/caregiver ability to perform ulcer/skin care regimen upon admission and as needed Assess ulceration(s) every  visit Notes: Electronic Signature(s) Signed: 03/04/2020 5:40:22 PM By: Gretta Cool, BSN, RN, CWS, Kim RN, BSN Entered By: Gretta Cool, BSN, RN, CWS, Kim on 03/04/2020 09:35:03 Timothy Gibson ([id number]) -------------------------------------------------------------------------------- Pain Assessment Details Patient Name: Timothy Gibson Date of Service: 03/04/2020 9:00 AM Medical Record Number: [id number] Patient Account Number: [id number] Date of Birth/Sex: 1951/04/12 (69 y.o. M) Treating RN: Cornell Barman Primary Care Latreshia Beauchaine: Merrie Roof Other Clinician: Referring Daren Yeagle: Merrie Roof Treating Yohan Samons/Extender: Melburn Hake, [name] Weeks in Treatment: 48 Active Problems Location of Pain Severity and Description of Pain Patient Has Paino No Site Locations With Dressing Change: No Pain Management and Medication Current Pain Management: Electronic Signature(s) Signed: 03/04/2020 12:28:02 PM By: Darci Needle Signed: 03/04/2020 5:40:22 PM By: Gretta Cool, BSN, RN, CWS, Kim RN, BSN Entered By: Darci Needle on 03/04/2020 09:09:40 Timothy Gibson ([id number]) -------------------------------------------------------------------------------- Patient/Caregiver Education Details Patient Name: Timothy Gibson Date of Service: 03/04/2020 9:00 AM Medical Record Number: [id number] Patient Account Number: [id number] Date of Birth/Gender: 31-Jul-1950 (68 y.o. M) Treating RN: Cornell Barman Primary Care Physician: Merrie Roof Other Clinician: Referring Physician:  LANE, RACHEL Treating Physician/Extender: Worthy Keeler Weeks in Treatment: 4 Education Assessment Education Provided To: Patient Education Topics Provided Wound Debridement: Handouts: Wound Debridement Methods: Demonstration, Explain/Verbal Responses: State content correctly Wound/Skin Impairment: Handouts: Caring for Your Ulcer Methods: Explain/Verbal Responses: State content correctly Electronic Signature(s) Signed: 03/04/2020 5:40:22  PM By: Gretta Cool, BSN, RN, CWS, Kim RN, BSN Entered By: Gretta Cool, BSN, RN, CWS, Kim on 03/04/2020 09:39:42 Timothy Gibson ([id number]) -------------------------------------------------------------------------------- Wound Assessment Details Patient Name: Timothy Gibson Date of Service: 03/04/2020 9:00 AM Medical Record Number: [id number] Patient Account Number: [id number] Date of Birth/Sex: 25-Aug-1950 (69 y.o. M) Treating RN: Cornell Barman Primary Care Narda Fundora: Merrie Roof Other Clinician: Referring Tanazia Achee: Merrie Roof Treating Alyissa Whidbee/Extender: Melburn Hake, [name] Weeks in Treatment: 33 Wound Status Wound Number: 3 Primary Diabetic Wound/Ulcer of the Lower Extremity Etiology: Wound Location: Left Toe Great Wound Open Wounding Event: Gradually Appeared Status: Date Acquired: 06/19/2019 Comorbid Lymphedema, Arrhythmia, Hypertension, Type II Weeks Of Treatment: 33 History: Diabetes, End Stage Renal Disease, Rheumatoid Arthritis, Clustered Wound: No Osteoarthritis, Confinement Anxiety Photos Wound Measurements Length: (cm) 0.5 Width: (cm) 0.5 Depth: (cm) 0.1 Area: (cm) 0.196 Volume: (cm) 0.02 % Reduction in Area: 92.8% % Reduction in Volume: 92.6% Epithelialization: Small (1-33%) Wound Description Classification: Grade 2 Foul Wound Margin: Thickened Sloug Exudate Amount: Medium Exudate Type: Serous Exudate Color: amber Odor After Cleansing: No h/Fibrino Yes Wound Bed Granulation Amount: Medium (34-66%) Exposed Structure Granulation Quality: Pink Fascia Exposed: No Necrotic Amount: Medium (34-66%) Fat Layer (Subcutaneous Tissue) Exposed: Yes Necrotic Quality: Adherent Slough Tendon Exposed: No Muscle Exposed: No Joint Exposed: No Bone Exposed: No Treatment Notes Wound #3 (Left Toe Great) Notes Silver alginate, conform, offloading shoe Electronic Signature(s) DELWYN, SCOGGIN ([id number]) Signed: 03/04/2020 12:28:02 PM By: Darci Needle Signed: 03/04/2020  5:40:22 PM By: Gretta Cool, BSN, RN, CWS, Kim RN, BSN Entered By: Darci Needle on 03/04/2020 09:10:58 Timothy Gibson ([id number]) -------------------------------------------------------------------------------- Vitals Details Patient Name: Timothy Gibson Date of Service: 03/04/2020 9:00 AM Medical Record Number: [id number] Patient Account Number: [id number] Date of Birth/Sex: 03/05/1951 (69 y.o. M) Treating RN: Cornell Barman Primary Care Taziyah Iannuzzi: Merrie Roof Other Clinician: Referring Maxyne Derocher: Merrie Roof Treating Kilynn Fitzsimmons/Extender: Melburn Hake, [name] Weeks in Treatment: 53 Vital Signs Time Taken: 08:50 Temperature (F): 98.2 Height (in): 76 Pulse (bpm): 71 Weight (lbs): 244 Respiratory Rate (breaths/min): 18 Body Mass Index (BMI): 29.7 Blood Pressure (mmHg): 132/70 Reference Range: 80 - 120 mg / dl Electronic Signature(s) Signed: 03/04/2020 12:28:02 PM By: Darci Needle Entered By: Darci Needle on 03/04/2020 09:09:30

## 2020-03-08 DIAGNOSIS — Z992 Dependence on renal dialysis: Secondary | ICD-10-CM | POA: Diagnosis not present

## 2020-03-08 DIAGNOSIS — D631 Anemia in chronic kidney disease: Secondary | ICD-10-CM | POA: Diagnosis not present

## 2020-03-08 DIAGNOSIS — N186 End stage renal disease: Secondary | ICD-10-CM | POA: Diagnosis not present

## 2020-03-08 DIAGNOSIS — N2581 Secondary hyperparathyroidism of renal origin: Secondary | ICD-10-CM | POA: Diagnosis not present

## 2020-03-08 DIAGNOSIS — D509 Iron deficiency anemia, unspecified: Secondary | ICD-10-CM | POA: Diagnosis not present

## 2020-03-09 DIAGNOSIS — Z992 Dependence on renal dialysis: Secondary | ICD-10-CM | POA: Diagnosis not present

## 2020-03-09 DIAGNOSIS — N186 End stage renal disease: Secondary | ICD-10-CM | POA: Diagnosis not present

## 2020-03-10 DIAGNOSIS — D509 Iron deficiency anemia, unspecified: Secondary | ICD-10-CM | POA: Diagnosis not present

## 2020-03-10 DIAGNOSIS — Z992 Dependence on renal dialysis: Secondary | ICD-10-CM | POA: Diagnosis not present

## 2020-03-10 DIAGNOSIS — N2581 Secondary hyperparathyroidism of renal origin: Secondary | ICD-10-CM | POA: Diagnosis not present

## 2020-03-10 DIAGNOSIS — N186 End stage renal disease: Secondary | ICD-10-CM | POA: Diagnosis not present

## 2020-03-10 DIAGNOSIS — D631 Anemia in chronic kidney disease: Secondary | ICD-10-CM | POA: Diagnosis not present

## 2020-03-11 ENCOUNTER — Encounter: Payer: Medicare Other | Attending: Physician Assistant | Admitting: Physician Assistant

## 2020-03-11 ENCOUNTER — Other Ambulatory Visit: Payer: Self-pay

## 2020-03-11 DIAGNOSIS — F17290 Nicotine dependence, other tobacco product, uncomplicated: Secondary | ICD-10-CM | POA: Insufficient documentation

## 2020-03-11 DIAGNOSIS — I10 Essential (primary) hypertension: Secondary | ICD-10-CM | POA: Insufficient documentation

## 2020-03-11 DIAGNOSIS — Z992 Dependence on renal dialysis: Secondary | ICD-10-CM | POA: Insufficient documentation

## 2020-03-11 DIAGNOSIS — E11621 Type 2 diabetes mellitus with foot ulcer: Secondary | ICD-10-CM | POA: Insufficient documentation

## 2020-03-11 DIAGNOSIS — L97529 Non-pressure chronic ulcer of other part of left foot with unspecified severity: Secondary | ICD-10-CM | POA: Diagnosis present

## 2020-03-11 DIAGNOSIS — L97522 Non-pressure chronic ulcer of other part of left foot with fat layer exposed: Secondary | ICD-10-CM | POA: Insufficient documentation

## 2020-03-11 DIAGNOSIS — M86672 Other chronic osteomyelitis, left ankle and foot: Secondary | ICD-10-CM | POA: Diagnosis not present

## 2020-03-11 DIAGNOSIS — E1169 Type 2 diabetes mellitus with other specified complication: Secondary | ICD-10-CM | POA: Diagnosis not present

## 2020-03-11 NOTE — Progress Notes (Addendum)
Timothy Gibson, Timothy Gibson ([id number]) Visit Report for 03/11/2020 Chief Complaint Document Details Patient Name: Timothy Gibson, Timothy Gibson Date of Service: 03/11/2020 9:15 AM Medical Record Number: [id number] Patient Account Number: [id number] Date of Birth/Sex: Dec 24, 1950 (69 y.o. M) Treating RN: Cornell Barman Primary Care Provider: Merrie Roof Other Clinician: Referring Provider: Merrie Roof Treating Provider/Extender: Melburn Hake, Simuel Stebner Weeks in Treatment: 77 Information Obtained from: Patient Chief Complaint Left Great Toe Electronic Signature(s) Signed: 03/11/2020 9:33:36 AM By: Worthy Keeler PA-C Entered By: Worthy Keeler on 03/11/2020 09:33:36 Timothy Gibson ([id number]) -------------------------------------------------------------------------------- Debridement Details Patient Name: Timothy Gibson Date of Service: 03/11/2020 9:15 AM Medical Record Number: [id number] Patient Account Number: [id number] Date of Birth/Sex: Dec 17, 1950 (69 y.o. M) Treating RN: Grover Canavan Primary Care Provider: Merrie Roof Other Clinician: Referring Provider: Merrie Roof Treating Provider/Extender: Melburn Hake, Latrice Storlie Weeks in Treatment: 34 Debridement Performed for Wound #3 Left Toe Great Assessment: Performed By: Physician [name], Alton Bouknight E., PA-C Debridement Type: Debridement Severity of Tissue Pre Debridement: Fat layer exposed Level of Consciousness (Pre- Awake and Alert procedure): Pre-procedure Verification/Time Out Yes - 09:35 Taken: Start Time: 09:36 Pain Control: Lidocaine Total Area Debrided (L x W): 0.4 (cm) x 0.3 (cm) = 0.12 (cm) Tissue and other material Viable, Non-Viable, Callus, Slough, Subcutaneous, Slough debrided: Level: Skin/Subcutaneous Tissue Debridement Description: Excisional Instrument: Curette Bleeding: Minimum Hemostasis Achieved: Pressure End Time: 09:40 Procedural Pain: 0 Post Procedural Pain: 0 Response to Treatment: Procedure was tolerated well Level of  Consciousness (Post- Awake and Alert procedure): Post Debridement Measurements of Total Wound Length: (cm) 0.4 Width: (cm) 0.3 Depth: (cm) 0.2 Volume: (cm) 0.019 Character of Wound/Ulcer Post Debridement: Improved Severity of Tissue Post Debridement: Fat layer exposed Post Procedure Diagnosis Same as Pre-procedure Electronic Signature(s) Signed: 03/11/2020 4:51:51 PM By: Grover Canavan Signed: 03/11/2020 5:25:12 PM By: Worthy Keeler PA-C Entered By: Grover Canavan on 03/11/2020 09:43:34 Timothy Gibson ([id number]) -------------------------------------------------------------------------------- HPI Details Patient Name: Timothy Gibson Date of Service: 03/11/2020 9:15 AM Medical Record Number: [id number] Patient Account Number: [id number] Date of Birth/Sex: 1951/04/18 (69 y.o. M) Treating RN: Cornell Barman Primary Care Provider: Merrie Roof Other Clinician: Referring Provider: Merrie Roof Treating Provider/Extender: Melburn Hake, Candia Kingsbury Weeks in Treatment: 91 History of Present Illness HPI Description: 11/27/17 on evaluation today patient presents for initial evaluation concerning an issue that he has been having with his right great toe which began last Thursday 11/22/17. He has a history of diabetes mellitus type to which he has had for greater than 30 years currently he is on insulin. Subsequently he also has a history of hypertension. On physical exam inspection is also appears he may have some peripheral vascular disease with his ABI's being noncompressible bilaterally. He is on dialysis as well and this is on Monday, Wednesday, and Friday. Patient was hospitalized back in January/February 2019 although this was more for stomach/back pain though they never told me exactly what was going on. This is according to the patient. Subsequently the ulcer which is between the third and fourth toe when space of the right foot as well is on the plantar aspect of the fourth toe is due to him  having cleaned his toes this morning and he states that his finger which is large split the toe tissue in between causing the wounds that we currently see. With that being said he states this has happened before I explained I would definitely recommend that he not do this any longer. He can use a small soft washcloth to  clean between the toes without causing trauma. Subsequently the right great toe hatchery does show evidence of necrotic tissue present on the surface of the wound specifically there is callous and dead skin surrounding which is trapping fluid causing problems as far as the wound is concerned. The surface of the wound does show slough although due to his vascular flow I'm not going to sharply debride this today I think I will selectively debride away the necrotic skin as well is callous from around the surrounding so this will not continue to be a moisture issue. Nonetheless the patient has no pain he does have diabetic neuropathy. 12/06/17-He is here in follow-up evaluation for a right great toe ulcer. He is voicing no complaints or concerns. He continues to infrequently/socially smokes cigars with no desire to quit. He has been compliant with offloading, using open toed surgical shoe; has been compliant using Santyl daily. He continues on clindamycin although takes it inconsistently secondary to indigestion. The plain film x-ray performed on 5/23 impression: Soft tissue swelling of the left first toe and is noted with probable irregular lucency involving distal tuft of first distal phalanx concerning for osteomyelitis. MRI may be performed for further evaluation; MRI ordered. The vascular evaluation performed on 5/24 field non- compressible ABIs bilaterally and reduced TBI bilaterally suggesting significant tibial disease. He will be referred to vascular medicine for further evaluation. 12/13/17-He is here in follow-up evaluation for right great toe ulcer. He has appointment next Thursday  for the MRI and vascular evaluation. Wound culture obtained last week grew abundant Klebsiella oxytoca (multidrug sensitivity), and abundant enteric coccus faecalis (sensitive to ampicillin). Ampicillin and Cipro were called in, along with a probiotic. In light of his appointments next Thursday he will follow-up in 2 weeks, we will continue with Santyl 12/27/17-He is here for evaluation for a right great toe ulcer. 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 He did have evaluation by vascular medicine, Dr. Ronalee Belts, on 6/13, at that appointment it was decided for him to undergo angiography with possible intervention but he refused and is still considering. If he chooses to have it done he will contact the office. He has not heard back from either ID offices that he was referred to two weeks ago: [place] and [place]. there is improvement in both appearance and measurement to the ulcer. We will extend antibiotic therapy (amoxicillin 500 every 12, Cipro 500 daily) for additional 2 weeks pending infectious disease consult, he will continue with Santyl daily. He was reminded to continue with probiotic therapy while on oral antibiotics; he denies any GI disturbance. 01/03/18-He is here in follow-up evaluation for right great toe ulcer. He is decided to not undergo any vascular intervention at this time. He was established with [facility] infectious disease (Dr. Linus Salmons) last week initiated on vancomycin and cefazolin with dialysis treatment for 6 weeks. We will continue with current treatment plan with Santyl and offloading and he will follow-up in 2 weeks 01/17/18-He is seen in follow-up evaluation for right great toe ulcer. There is improvement in appearance with less nonviable tissue present. We will continue with same treatment plan he will follow-up next week. He is tolerating IV antibiotics without any complications 0/93/26-ZT is  seen in follow up evaluation for right great toe ulcer. There continues to be improvement. He is tolerating IV antibiotics with hemodialysis, completion date of 7/31. We will transition to collagen and and follow-up next week 02/07/18-He  is here in follow up for a right great toe ulcer. There is red granulation tissue throughout the wound, improved in appearance. He completed antibiotics yesterday. He saw podiatry last Thursday for nail trimming, at that appointment he periwound callus was trimmed and a culture was obtained; culture negative. He does not have a follow up appointment with infectious disease. We will submit for grafix. 02/14/18-He is here in follow-up evaluation for a right great toe ulcer. There is slow improvement, red granulation tissue throughout. The insurance approval for grafix is pending. We will continue with collagen and he'll follow-up next week 02/21/18-He is seen in follow-up evaluation for right great toe ulcer. There is improvement with red granulation tissue throughout. He was approved for oasis and this was placed today. He will follow up next week 02/28/18-He is seen in follow-up evaluation for right great toe ulcer. The wound is stable, oasis applied and he'll follow-up next week 03/07/18-He is seen in follow-up violation of her right great toe ulcer. The wound is dry, no evidence of drainage. The wound appears healed today. He was painted with Betadine, instructed to paint with Betadine daily, cover with band-aid for the next week and then cover with band-aid for the following week continue with surgical shoe. He was encouraged to contact the clinic with any evidence of drainage, or change in appearance to toe/wound. He will be discharged from wound care services Readmission: 07/14/18 on evaluation today patient presents for initial evaluation our clinic concerning issues that he is having with his left great toe. We previously saw him in 2019 for his right great toe which  was actually worse at that time and subsequently was able to be completely healed in the end although it did take several months. With that being said the patient is currently having an area on his left great toe that occurred initially as result of a blister that came up he's not really sure why or how. Subsequently this was initially treated by his podiatrist though the recommendation there was apparently amputation according to the patient and his daughter who was present with him today. With that being said the patient did not want to proceed with amputation and would like to try to perform wound care and get this to heal without having to go down that road. We were able to do that with his other toe and so he would like to at least give that a try before going forward with amputation. Again I completely understand his concern in this regard. With that being said I did discuss with the patient obviously that it's always a chance that we cannot get this to heal with normal wound care measures but obviously we will give it our best shot. He does actually have an appointment scheduled for later today with the vascular specialist in order to evaluate his blood flow. That was at the recommendation of his podiatrist as well. Obviously if he is having good arterial flow and nonetheless is still experiencing issues with having the wound delay in healing then it may simply be a matter of we need to initiate more aggressive wound to therapy in order to see if we can get this to heal. No fevers, chills, NATAN, HARTOG. ([id number]) nausea, or vomiting noted at this time. The patient notes that this woman has been present for approximately three weeks. He is not a smoker. His most recent hemoglobin A1c was 6.2 on 06/18/19. 07/17/2019 on evaluation today patient appears unfortunately to not be  doing as well. He did see vascular I did review the note as well. That was on 07/15/2019. Subsequently there can actually  be taking him to the OR for an angiogram due to poor arterial flow into this extremity. Apparently the atherosclerotic changes were severe. The patient is stated to be at risk for limb loss. Nonetheless the good news is this is being scheduled fairly quickly he will have the surgery/procedure next Thursday. With that being said I am going also go ahead based on the results of his x-ray which showed some chance of osteomyelitis in the distal portion of the toe get the patient set up for an MRI to further evaluate for the possibility of osteomyelitis obviously if he does have osteomyelitis we need to know so that we can address this appropriately. 07/29/2019 upon evaluation today patient appears to be doing better in general with regard to his toe ulcer. He does have much better blood flow you can actually feel a palpable pulse which appears to be very strong at this time. I did review his arterial intervention/angiogram which did show that he had evidence of sufficient arterial flow restoration at this point. He did have occlusions that were 60 and 80% that residually were only 10 and 25% with no limiting flow. This is excellent news and hopefully he will continue to show signs of improvement in light of the improved vascular status. With that being said the toe mainly shows somewhat of an eschar today I think that this is fairly stable and as such probably would be best not to really do much in the way of debridement at this point I do believe Betadine would likely be a good option. 08/07/2019 on evaluation today patient actually appears to be doing about the same. The one difference is the eschar that we were just seeing if we can maintain is actually starting to loosen up and leak around the edges I think it is time to actually go ahead and remove this today. Is also no longer stable is very soft. Nonetheless the patient is okay with proceeding with debridement at this point. Fortunately there is no  signs of active infection. No fevers, chills, nausea, vomiting, or diarrhea. The patient states that he took his last antibiotic that he had last night. He would need a refill as he will not be seeing infectious disease until February 2. Nonetheless obviously I do not want him to have any break in therapy especially when he is doing as well as he is currently. 08/14/2019 on evaluation today patient appears to be doing quite well all things considered in regard to his toe. I do feel like he is making progress and though this is not completely healed by any means we still have some ways to go I feel like he is making progress which is excellent. There is no sign of active infection at this time systemically. He does have osteomyelitis of the distal phalanx of his great toe on the left. He did see Dr. Linus Salmons. That is the infectious disease doctor in [place] where he was referred. Dr. Linus Salmons apparently according to the note did not feel like the patient's MRI revealed osteomyelitis but just reactive marrow and subsequently did not recommend any antibiotic therapy whatsoever. To be peripherally honest I am not really in agreement with this based on the MRI results and what we are seeing and I think he has been responding at least decently well to oral medication as well. Nonetheless we may  want to check into the possibility of IV antibiotics being administered at the dialysis center. I will discuss this with Dr. Dellia Nims further. 08/21/2019 upon evaluation today patient appears at this point to be making some progress with regard to his wounds. He has been tolerating the dressing changes without complication. Fortunately there is no evidence of active infection at this time. Overall I feel like he is doing well with the oral antibiotics I discussed with Dr. Dellia Nims the possibility of doing IV antibiotic therapy versus continuing with the oral. His opinion was if the patient is doing well with oral to maybe  stick with that. Obviously we can initiate additional Cipro as well just to help ensure that there is nothing worsening here. The Cipro should be beneficial for him as well. That helps cover more gram-negative's were is at the doxycycline is more gram- positive's. 09/02/2019 on evaluation today patient appears to be doing fairly well upon inspection today. With that being said though the patient is continuing to do well he does also continue to develop issues with buildup of necrotic tissue on the distal portion of his toe he does have good arterial flow however. For that reason I am going to suggest that what we may need to do is consider using something such as Santyl to try to keep this free and clear. Also discussed in greater detail HBO therapy with him today please see plan for additional recommendations and results of this discussion. 09/11/2019 upon evaluation today patient appears to be doing well with regard to his toe ulcer. This actually appears to be much better today using the Santyl than it was previous. Fortunately there is no signs of active infection. No fevers, chills, nausea, vomiting, or diarrhea. 09/18/2019 upon evaluation today patient appears to be doing much better in regard to his wound in general. The Santyl was doing a good job at loosening stuff up here for [place] which is excellent news. With that being said he is still taking the oral antibiotic. I sent this in last for him on 09/03/2019. This was the doxycycline. When this runs out he will actually be complete as far as the treatment is concerned. With that being said I am getting continue the Cipro for 1 additional month. Overall his wound seems to be doing quite well. 09/25/2019 upon evaluation today patient appears to be doing well with regard to his toe ulcer. This is measuring much better and overall seems to be making good progress. Fortunately there is no signs of active infection at this time. No fevers, chills, nausea,  vomiting, or diarrhea. 10/02/2019 upon evaluation today patient appears to be doing well with regard to his wound. He has been tolerating the dressing changes without complication. Fortunately there is no signs of active infection at this time. No fevers, chills, nausea, vomiting, or diarrhea. Overall I am extremely pleased with how things seem to be progressing. I think the wound bed is at the point now where we can likely proceed with additional measures to try to get this healed more quickly I think Dermagraft may be a good possibility for the patient. We can definitely look into seeing about approval for this. 10/09/2019 upon evaluation today patient's wound actually appears to be doing well although it still is progressing somewhat slowly compared to what I would like to see. With that being said I did order Apligraf for the patient which I felt would keep the wound nice and moist without allowing it to dry out  and hopefully allow this to heal much more effectively and quickly. The patient actually did obtain approval and therefore we can apply that today. 10/23/2019 upon evaluation today patient's wound actually appears to be doing significantly better with regard to his toe ulcer. The tissue at the base of the wound is greatly improved and overall I am very pleased with the progress made. We did use Apligraf 2 weeks ago on the toe that did excellent for him much it has completely dissolved after as of the 2 weeks. They only had to change the dressing a few times due to drainage through. Obviously they left the Steri-Strips and Mepitel in place. Unfortunately we do not have the Apligraf available today for application regard therefore have to use a different product here in the clinic today and then order the Apligraf for next Thursday. That will be his second application. 4/22; patient arrived with overhanging skin and subcutaneous tissue on the wound margins this was removed. Apligraf #2  applied 11/13/2019 upon evaluation today patient actually appears to be doing well with regard to his foot ulcer. He has been tolerating the dressing changes without complication. The overall wound bed appears to be healthier and has filled in as well as not nearly as deep as it was the tissue is also better quality. Overall I am extremely pleased with how things seem to be progressing. The patient is likewise very happy in this regard. 5/20; wound is measuring smaller had Apligraf #3 last time. Apligraf #4 today Timothy Gibson, Timothy Gibson ([id number]) 12/12/2019 upon evaluation today patient actually appears to be doing quite well with regard to his wound in my opinion. The ulcer is significantly smaller compared to last time I saw him although that has been several weeks. I do believe the Apligraf has been beneficial he has 1 remaining today this will be application #5. 4/58/0998 upon evaluation today patient appears to be doing well with regard to his toe ulcer. He is measuring about the same today but he has come a long way from where this started. Fortunately there is no evidence of active infection which is great news and overall I am very pleased with the progress that is been made. Nonetheless at this point we have completed the Apligraf course he did see improvement through this but I am going to switch to something different as of today. 01/08/2020 upon evaluation today patient appears to be doing much better in regards to his toe ulcer this is measuring smaller and overall very pleased with the progress. The Hydrofera Blue seems to be doing a great job. Fortunately there is no evidence of active infection at this time. No fevers, chills, nausea, vomiting, or diarrhea. 01/29/2020 upon evaluation today patient appears to be doing okay in regard to his toe ulcer. The main issue I see is that he actually has trouble right now with callus growing and covering over the wound bed. This prevents him from being  able to get a dressing in contact with the wound bed and subsequently fluid collection underneath. I think that we probably need to see him more frequently to keep this pared down and debrided to the point to allow the dressings to effectively heal this wound. I discussed that with the patient today. 02/05/2020 on evaluation today patient appears to be doing well with regard to his wounds currently. In fact he just has 1 on his toe remaining at this point he seems to be doing quite well in my opinion. I am  very pleased with the overall appearance today. There is no signs of active infection I do think keeping the callus trimmed down has been of great benefit as far as healing is concerned. I do not think we need to go 3 weeks out like we were in the past I believe that is been somewhat more detrimental than good for him. 02/19/2020 upon evaluation today patient appears to be doing better in regard to his toe ulcer still there is callus buildup around the top of the toe which unfortunately is I think causing some issues here with trapping a little fluid underneath them after cleaning this away. Fortunately there is no evidence of active infection at this time. Overall I feel like the patient is doing quite well which is great news. 03/04/2020 on evaluation today patient appears to be doing well with regard to his toe ulcer though he still has a lot of drying out of the endoform and a lot of callus buildup as well unfortunately. With that being said I think that he is can require some sharp debridement to clear away some of the callus today. We will see what the wound looks like following. 03/11/2020 on evaluation today patient appears to be doing excellent in regard to his toe ulcer. He did have a lot of callus and even some of the nail, growing over top of the area where the wound is trying to heal. For that reason I did actually trim back some of the nail also did go ahead and trim away some of the callus  as well. He tolerated all this without complication the wound appears to be doing well and is showing no signs of infection at this point. Overall I am very pleased. Electronic Signature(s) Signed: 03/11/2020 10:32:12 AM By: Worthy Keeler PA-C Entered By: Worthy Keeler on 03/11/2020 10:32:12 Timothy Gibson, Timothy Gibson ([id number]) -------------------------------------------------------------------------------- Physical Exam Details Patient Name: Timothy Gibson Date of Service: 03/11/2020 9:15 AM Medical Record Number: [id number] Patient Account Number: [id number] Date of Birth/Sex: Aug 27, 1950 (69 y.o. M) Treating RN: Cornell Barman Primary Care Provider: Merrie Roof Other Clinician: Referring Provider: Merrie Roof Treating Provider/Extender: Melburn Hake, Mckala Pantaleon Weeks in Treatment: 77 Constitutional Well-nourished and well-hydrated in no acute distress. Respiratory normal breathing without difficulty. Psychiatric this patient is able to make decisions and demonstrates good insight into disease process. Alert and Oriented x 3. pleasant and cooperative. Notes Upon inspection patient's wound did require sharp debridement to clear away some of the necrotic debris currently. This included callus, slough, and I did clean down to subcutaneous tissue with minimal bleeding. The patient's nail was also somewhat in the way and I did trim this back to try to keep it from causing any pressure on the wound bed. Electronic Signature(s) Signed: 03/11/2020 10:33:27 AM By: Worthy Keeler PA-C Entered By: Worthy Keeler on 03/11/2020 10:33:26 Timothy Gibson, Timothy Gibson ([id number]) -------------------------------------------------------------------------------- Physician Orders Details Patient Name: Timothy Gibson Date of Service: 03/11/2020 9:15 AM Medical Record Number: [id number] Patient Account Number: [id number] Date of Birth/Sex: Apr 25, 1951 (69 y.o. M) Treating RN: Grover Canavan Primary Care Provider: Merrie Roof Other Clinician: Referring Provider: Merrie Roof Treating Provider/Extender: Melburn Hake, Dakota Vanwart Weeks in Treatment: 57 Verbal / Phone Orders: No Diagnosis Coding ICD-10 Coding Code Description [phone] Other chronic osteomyelitis, left ankle and foot E11.621 Type 2 diabetes mellitus with foot ulcer L97.522 Non-pressure chronic ulcer of other part of left foot with fat layer exposed I10 Essential (primary) hypertension Wound Cleansing Wound #3 Left  Toe Great o Dial antibacterial soap, wash wounds, rinse and pat dry prior to dressing wounds o May Shower, gently pat wound dry prior to applying new dressing. Anesthetic (add to Medication List) Wound #3 Left Toe Great o Topical Lidocaine 4% cream applied to wound bed prior to debridement (In Clinic Only). Primary Wound Dressing Wound #3 Left Toe Great o Silver Alginate Secondary Dressing Wound #3 Left Toe Great o Conform/Kerlix Dressing Change Frequency Wound #3 Left Toe Great o Other: - As needed Follow-up Appointments Wound #3 Left Toe Great o Return Appointment in 1 week. Off-Loading Wound #3 Left Toe Great o Open toe surgical shoe with peg assist. - left foot Electronic Signature(s) Signed: 03/11/2020 4:51:51 PM By: Grover Canavan Signed: 03/11/2020 5:25:12 PM By: Worthy Keeler PA-C Entered By: Grover Canavan on 03/11/2020 09:44:59 Timothy Gibson, Timothy Gibson ([id number]) -------------------------------------------------------------------------------- Problem List Details Patient Name: Timothy Gibson Date of Service: 03/11/2020 9:15 AM Medical Record Number: [id number] Patient Account Number: [id number] Date of Birth/Sex: 10-02-1950 (69 y.o. M) Treating RN: Cornell Barman Primary Care Provider: Merrie Roof Other Clinician: Referring Provider: Merrie Roof Treating Provider/Extender: Melburn Hake, Floretta Petro Weeks in Treatment: 73 Active Problems ICD-10 Encounter Code Description Active Date MDM Diagnosis [phone]  Other chronic osteomyelitis, left ankle and foot 07/29/2019 No Yes E11.621 Type 2 diabetes mellitus with foot ulcer 07/15/2019 No Yes L97.522 Non-pressure chronic ulcer of other part of left foot with fat layer 07/15/2019 No Yes exposed [place] (primary) hypertension 07/15/2019 No Yes Inactive Problems Resolved Problems Electronic Signature(s) Signed: 03/11/2020 9:33:30 AM By: Worthy Keeler PA-C Entered By: Worthy Keeler on 03/11/2020 09:33:29 SANJAY, BROADFOOT ([id number]) -------------------------------------------------------------------------------- Progress Note Details Patient Name: Timothy Gibson Date of Service: 03/11/2020 9:15 AM Medical Record Number: [id number] Patient Account Number: [id number] Date of Birth/Sex: 02/10/1951 (69 y.o. M) Treating RN: Cornell Barman Primary Care Provider: Merrie Roof Other Clinician: Referring Provider: Merrie Roof Treating Provider/Extender: Melburn Hake, Janiesha Diehl Weeks in Treatment: 81 Subjective Chief Complaint Information obtained from Patient Left Great Toe History of Present Illness (HPI) 11/27/17 on evaluation today patient presents for initial evaluation concerning an issue that he has been having with his right great toe which began last Thursday 11/22/17. He has a history of diabetes mellitus type to which he has had for greater than 30 years currently he is on insulin. Subsequently he also has a history of hypertension. On physical exam inspection is also appears he may have some peripheral vascular disease with his ABI's being noncompressible bilaterally. He is on dialysis as well and this is on Monday, Wednesday, and Friday. Patient was hospitalized back in January/February 2019 although this was more for stomach/back pain though they never told me exactly what was going on. This is according to the patient. Subsequently the ulcer which is between the third and fourth toe when space of the right foot as well is on the plantar aspect of the  fourth toe is due to him having cleaned his toes this morning and he states that his finger which is large split the toe tissue in between causing the wounds that we currently see. With that being said he states this has happened before I explained I would definitely recommend that he not do this any longer. He can use a small soft washcloth to clean between the toes without causing trauma. Subsequently the right great toe hatchery does show evidence of necrotic tissue present on the surface of the wound specifically there is callous and dead skin surrounding  which is trapping fluid causing problems as far as the wound is concerned. The surface of the wound does show slough although due to his vascular flow I'm not going to sharply debride this today I think I will selectively debride away the necrotic skin as well is callous from around the surrounding so this will not continue to be a moisture issue. Nonetheless the patient has no pain he does have diabetic neuropathy. 12/06/17-He is here in follow-up evaluation for a right great toe ulcer. He is voicing no complaints or concerns. He continues to infrequently/socially smokes cigars with no desire to quit. He has been compliant with offloading, using open toed surgical shoe; has been compliant using Santyl daily. He continues on clindamycin although takes it inconsistently secondary to indigestion. The plain film x-ray performed on 5/23 impression: Soft tissue swelling of the left first toe and is noted with probable irregular lucency involving distal tuft of first distal phalanx concerning for osteomyelitis. MRI may be performed for further evaluation; MRI ordered. The vascular evaluation performed on 5/24 field non- compressible ABIs bilaterally and reduced TBI bilaterally suggesting significant tibial disease. He will be referred to vascular medicine for further evaluation. 12/13/17-He is here in follow-up evaluation for right great toe ulcer. He has  appointment next Thursday for the MRI and vascular evaluation. Wound culture obtained last week grew abundant Klebsiella oxytoca (multidrug sensitivity), and abundant enteric coccus faecalis (sensitive to ampicillin). Ampicillin and Cipro were called in, along with a probiotic. In light of his appointments next Thursday he will follow-up in 2 weeks, we will continue with Santyl 12/27/17-He is here for evaluation for a right great toe ulcer. 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 He did have evaluation by vascular medicine, Dr. Ronalee Belts, on 6/13, at that appointment it was decided for him to undergo angiography with possible intervention but he refused and is still considering. If he chooses to have it done he will contact the office. He has not heard back from either ID offices that he was referred to two weeks ago: [place] and [place]. there is improvement in both appearance and measurement to the ulcer. We will extend antibiotic therapy (amoxicillin 500 every 12, Cipro 500 daily) for additional 2 weeks pending infectious disease consult, he will continue with Santyl daily. He was reminded to continue with probiotic therapy while on oral antibiotics; he denies any GI disturbance. 01/03/18-He is here in follow-up evaluation for right great toe ulcer. He is decided to not undergo any vascular intervention at this time. He was established with [facility] infectious disease (Dr. Linus Salmons) last week initiated on vancomycin and cefazolin with dialysis treatment for 6 weeks. We will continue with current treatment plan with Santyl and offloading and he will follow-up in 2 weeks 01/17/18-He is seen in follow-up evaluation for right great toe ulcer. There is improvement in appearance with less nonviable tissue present. We will continue with same treatment plan he will follow-up next week. He is tolerating IV antibiotics without any  complications 7/82/95-AO is seen in follow up evaluation for right great toe ulcer. There continues to be improvement. He is tolerating IV antibiotics with hemodialysis, completion date of 7/31. We will transition to collagen and and follow-up next week 02/07/18-He is here in follow up for a right great toe ulcer. There is red granulation tissue throughout the wound, improved in appearance. He completed antibiotics yesterday. He saw podiatry last Thursday for nail trimming,  at that appointment he periwound callus was trimmed and a culture was obtained; culture negative. He does not have a follow up appointment with infectious disease. We will submit for grafix. 02/14/18-He is here in follow-up evaluation for a right great toe ulcer. There is slow improvement, red granulation tissue throughout. The insurance approval for grafix is pending. We will continue with collagen and he'll follow-up next week 02/21/18-He is seen in follow-up evaluation for right great toe ulcer. There is improvement with red granulation tissue throughout. He was approved for oasis and this was placed today. He will follow up next week 02/28/18-He is seen in follow-up evaluation for right great toe ulcer. The wound is stable, oasis applied and he'll follow-up next week 03/07/18-He is seen in follow-up violation of her right great toe ulcer. The wound is dry, no evidence of drainage. The wound appears healed today. He was painted with Betadine, instructed to paint with Betadine daily, cover with band-aid for the next week and then cover with band-aid for the following week continue with surgical shoe. He was encouraged to contact the clinic with any evidence of drainage, or change in appearance to toe/wound. He will be discharged from wound care services Readmission: 07/14/18 on evaluation today patient presents for initial evaluation our clinic concerning issues that he is having with his left great toe. We previously saw him in 2019  for his right great toe which was actually worse at that time and subsequently was able to be completely healed in the end although it did take several months. With that being said the patient is currently having an area on his left great toe that occurred initially as result of a blister that came up he's not really sure why or how. Subsequently this was initially treated by his podiatrist though the recommendation there was apparently amputation according to the patient and his daughter who was present with him today. With that being said the patient did not want to proceed with amputation and would like to try to perform wound care and get this to heal without having to go down that road. We were able to do that with his other toe and so he would like to at least give that a try before going forward with amputation. Timothy Gibson, Timothy Gibson ([id number]) Again I completely understand his concern in this regard. With that being said I did discuss with the patient obviously that it's always a chance that we cannot get this to heal with normal wound care measures but obviously we will give it our best shot. He does actually have an appointment scheduled for later today with the vascular specialist in order to evaluate his blood flow. That was at the recommendation of his podiatrist as well. Obviously if he is having good arterial flow and nonetheless is still experiencing issues with having the wound delay in healing then it may simply be a matter of we need to initiate more aggressive wound to therapy in order to see if we can get this to heal. No fevers, chills, nausea, or vomiting noted at this time. The patient notes that this woman has been present for approximately three weeks. He is not a smoker. His most recent hemoglobin A1c was 6.2 on 06/18/19. 07/17/2019 on evaluation today patient appears unfortunately to not be doing as well. He did see vascular I did review the note as well. That was on 07/15/2019.  Subsequently there can actually be taking him to the OR for an angiogram due to  poor arterial flow into this extremity. Apparently the atherosclerotic changes were severe. The patient is stated to be at risk for limb loss. Nonetheless the good news is this is being scheduled fairly quickly he will have the surgery/procedure next Thursday. With that being said I am going also go ahead based on the results of his x-ray which showed some chance of osteomyelitis in the distal portion of the toe get the patient set up for an MRI to further evaluate for the possibility of osteomyelitis obviously if he does have osteomyelitis we need to know so that we can address this appropriately. 07/29/2019 upon evaluation today patient appears to be doing better in general with regard to his toe ulcer. He does have much better blood flow you can actually feel a palpable pulse which appears to be very strong at this time. I did review his arterial intervention/angiogram which did show that he had evidence of sufficient arterial flow restoration at this point. He did have occlusions that were 60 and 80% that residually were only 10 and 25% with no limiting flow. This is excellent news and hopefully he will continue to show signs of improvement in light of the improved vascular status. With that being said the toe mainly shows somewhat of an eschar today I think that this is fairly stable and as such probably would be best not to really do much in the way of debridement at this point I do believe Betadine would likely be a good option. 08/07/2019 on evaluation today patient actually appears to be doing about the same. The one difference is the eschar that we were just seeing if we can maintain is actually starting to loosen up and leak around the edges I think it is time to actually go ahead and remove this today. Is also no longer stable is very soft. Nonetheless the patient is okay with proceeding with debridement at this  point. Fortunately there is no signs of active infection. No fevers, chills, nausea, vomiting, or diarrhea. The patient states that he took his last antibiotic that he had last night. He would need a refill as he will not be seeing infectious disease until February 2. Nonetheless obviously I do not want him to have any break in therapy especially when he is doing as well as he is currently. 08/14/2019 on evaluation today patient appears to be doing quite well all things considered in regard to his toe. I do feel like he is making progress and though this is not completely healed by any means we still have some ways to go I feel like he is making progress which is excellent. There is no sign of active infection at this time systemically. He does have osteomyelitis of the distal phalanx of his great toe on the left. He did see Dr. Linus Salmons. That is the infectious disease doctor in [place] where he was referred. Dr. Linus Salmons apparently according to the note did not feel like the patient's MRI revealed osteomyelitis but just reactive marrow and subsequently did not recommend any antibiotic therapy whatsoever. To be peripherally honest I am not really in agreement with this based on the MRI results and what we are seeing and I think he has been responding at least decently well to oral medication as well. Nonetheless we may want to check into the possibility of IV antibiotics being administered at the dialysis center. I will discuss this with Dr. Dellia Nims further. 08/21/2019 upon evaluation today patient appears at this point to be  making some progress with regard to his wounds. He has been tolerating the dressing changes without complication. Fortunately there is no evidence of active infection at this time. Overall I feel like he is doing well with the oral antibiotics I discussed with Dr. Dellia Nims the possibility of doing IV antibiotic therapy versus continuing with the oral. His opinion was if the patient is  doing well with oral to maybe stick with that. Obviously we can initiate additional Cipro as well just to help ensure that there is nothing worsening here. The Cipro should be beneficial for him as well. That helps cover more gram-negative's were is at the doxycycline is more gram- positive's. 09/02/2019 on evaluation today patient appears to be doing fairly well upon inspection today. With that being said though the patient is continuing to do well he does also continue to develop issues with buildup of necrotic tissue on the distal portion of his toe he does have good arterial flow however. For that reason I am going to suggest that what we may need to do is consider using something such as Santyl to try to keep this free and clear. Also discussed in greater detail HBO therapy with him today please see plan for additional recommendations and results of this discussion. 09/11/2019 upon evaluation today patient appears to be doing well with regard to his toe ulcer. This actually appears to be much better today using the Santyl than it was previous. Fortunately there is no signs of active infection. No fevers, chills, nausea, vomiting, or diarrhea. 09/18/2019 upon evaluation today patient appears to be doing much better in regard to his wound in general. The Santyl was doing a good job at loosening stuff up here for [place] which is excellent news. With that being said he is still taking the oral antibiotic. I sent this in last for him on 09/03/2019. This was the doxycycline. When this runs out he will actually be complete as far as the treatment is concerned. With that being said I am getting continue the Cipro for 1 additional month. Overall his wound seems to be doing quite well. 09/25/2019 upon evaluation today patient appears to be doing well with regard to his toe ulcer. This is measuring much better and overall seems to be making good progress. Fortunately there is no signs of active infection at this  time. No fevers, chills, nausea, vomiting, or diarrhea. 10/02/2019 upon evaluation today patient appears to be doing well with regard to his wound. He has been tolerating the dressing changes without complication. Fortunately there is no signs of active infection at this time. No fevers, chills, nausea, vomiting, or diarrhea. Overall I am extremely pleased with how things seem to be progressing. I think the wound bed is at the point now where we can likely proceed with additional measures to try to get this healed more quickly I think Dermagraft may be a good possibility for the patient. We can definitely look into seeing about approval for this. 10/09/2019 upon evaluation today patient's wound actually appears to be doing well although it still is progressing somewhat slowly compared to what I would like to see. With that being said I did order Apligraf for the patient which I felt would keep the wound nice and moist without allowing it to dry out and hopefully allow this to heal much more effectively and quickly. The patient actually did obtain approval and therefore we can apply that today. 10/23/2019 upon evaluation today patient's wound actually appears to be  doing significantly better with regard to his toe ulcer. The tissue at the base of the wound is greatly improved and overall I am very pleased with the progress made. We did use Apligraf 2 weeks ago on the toe that did excellent for him much it has completely dissolved after as of the 2 weeks. They only had to change the dressing a few times due to drainage through. Obviously they left the Steri-Strips and Mepitel in place. Unfortunately we do not have the Apligraf available today for application regard therefore have to use a different product here in the clinic today and then order the Apligraf for next Thursday. That will be his second application. 4/22; patient arrived with overhanging skin and subcutaneous tissue on the wound margins this  was removed. Apligraf #2 applied Timothy Gibson, Timothy Gibson ([id number]) 11/13/2019 upon evaluation today patient actually appears to be doing well with regard to his foot ulcer. He has been tolerating the dressing changes without complication. The overall wound bed appears to be healthier and has filled in as well as not nearly as deep as it was the tissue is also better quality. Overall I am extremely pleased with how things seem to be progressing. The patient is likewise very happy in this regard. 5/20; wound is measuring smaller had Apligraf #3 last time. Apligraf #4 today 12/12/2019 upon evaluation today patient actually appears to be doing quite well with regard to his wound in my opinion. The ulcer is significantly smaller compared to last time I saw him although that has been several weeks. I do believe the Apligraf has been beneficial he has 1 remaining today this will be application #5. 3/38/2505 upon evaluation today patient appears to be doing well with regard to his toe ulcer. He is measuring about the same today but he has come a long way from where this started. Fortunately there is no evidence of active infection which is great news and overall I am very pleased with the progress that is been made. Nonetheless at this point we have completed the Apligraf course he did see improvement through this but I am going to switch to something different as of today. 01/08/2020 upon evaluation today patient appears to be doing much better in regards to his toe ulcer this is measuring smaller and overall very pleased with the progress. The Hydrofera Blue seems to be doing a great job. Fortunately there is no evidence of active infection at this time. No fevers, chills, nausea, vomiting, or diarrhea. 01/29/2020 upon evaluation today patient appears to be doing okay in regard to his toe ulcer. The main issue I see is that he actually has trouble right now with callus growing and covering over the wound bed. This  prevents him from being able to get a dressing in contact with the wound bed and subsequently fluid collection underneath. I think that we probably need to see him more frequently to keep this pared down and debrided to the point to allow the dressings to effectively heal this wound. I discussed that with the patient today. 02/05/2020 on evaluation today patient appears to be doing well with regard to his wounds currently. In fact he just has 1 on his toe remaining at this point he seems to be doing quite well in my opinion. I am very pleased with the overall appearance today. There is no signs of active infection I do think keeping the callus trimmed down has been of great benefit as far as healing is concerned. I  do not think we need to go 3 weeks out like we were in the past I believe that is been somewhat more detrimental than good for him. 02/19/2020 upon evaluation today patient appears to be doing better in regard to his toe ulcer still there is callus buildup around the top of the toe which unfortunately is I think causing some issues here with trapping a little fluid underneath them after cleaning this away. Fortunately there is no evidence of active infection at this time. Overall I feel like the patient is doing quite well which is great news. 03/04/2020 on evaluation today patient appears to be doing well with regard to his toe ulcer though he still has a lot of drying out of the endoform and a lot of callus buildup as well unfortunately. With that being said I think that he is can require some sharp debridement to clear away some of the callus today. We will see what the wound looks like following. 03/11/2020 on evaluation today patient appears to be doing excellent in regard to his toe ulcer. He did have a lot of callus and even some of the nail, growing over top of the area where the wound is trying to heal. For that reason I did actually trim back some of the nail also did go ahead and trim  away some of the callus as well. He tolerated all this without complication the wound appears to be doing well and is showing no signs of infection at this point. Overall I am very pleased. Objective Constitutional Well-nourished and well-hydrated in no acute distress. Vitals Time Taken: 9:22 AM, Height: 76 in, Weight: 244 lbs, BMI: 29.7, Temperature: 98.3 F, Pulse: 85 bpm, Respiratory Rate: 16 breaths/min, Blood Pressure: 190/84 mmHg. Respiratory normal breathing without difficulty. Psychiatric this patient is able to make decisions and demonstrates good insight into disease process. Alert and Oriented x 3. pleasant and cooperative. General Notes: Upon inspection patient's wound did require sharp debridement to clear away some of the necrotic debris currently. This included callus, slough, and I did clean down to subcutaneous tissue with minimal bleeding. The patient's nail was also somewhat in the way and I did trim this back to try to keep it from causing any pressure on the wound bed. Integumentary (Hair, Skin) Wound #3 status is Open. Original cause of wound was Gradually Appeared. The wound is located on the Left Toe Great. The wound measures 0.4cm length x 0.3cm width x 0.1cm depth; 0.094cm^2 area and 0.009cm^3 volume. There is Fat Layer (Subcutaneous Tissue) exposed. There is no tunneling or undermining noted. There is a medium amount of serous drainage noted. The wound margin is thickened. There is no granulation within the wound bed. There is a small (1-33%) amount of necrotic tissue within the wound bed including Adherent Slough. 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 There was a Excisional Skin/Subcutaneous Tissue Debridement with a total area of 0.12 sq cm performed by [name], Thayden Lemire E., PA-C. With the following instrument(s): Curette to remove Viable and Non-Viable tissue/material. Material removed includes Callus, Subcutaneous Tissue, and Slough after achieving pain control using Lidocaine. No  specimens were taken. A time out was conducted at 09:35, prior to the start of the procedure. A Minimum amount of bleeding was controlled with Pressure. The procedure was tolerated well with a pain level of 0 throughout and a pain level of 0 following the procedure. Post Debridement Measurements: 0.4cm length x 0.3cm width x 0.2cm depth; 0.019cm^3 volume. Character of Wound/Ulcer Post Debridement is improved. Severity of Tissue Post Debridement is: Fat layer exposed. Post procedure Diagnosis Wound #3: Same as Pre-Procedure Plan Wound Cleansing: Wound #3 Left Toe Great: Dial antibacterial soap, wash wounds, rinse and pat dry prior to dressing wounds May Shower, gently pat wound dry prior to applying new dressing. Anesthetic (add to Medication List): Wound #3 Left Toe Great: Topical Lidocaine 4% cream applied to wound bed prior to debridement (In Clinic Only). Primary Wound Dressing: Wound #3 Left Toe Great: Silver Alginate Secondary Dressing: Wound #3 Left Toe Great: Conform/Kerlix Dressing Change Frequency: Wound #3 Left Toe Great: Other: - As needed Follow-up Appointments: Wound #3 Left Toe Great: Return Appointment in 1 week. Off-Loading: Wound #3 Left Toe Great: Open toe surgical shoe with peg assist. - left foot 1. I would recommend that we go ahead and continue with the silver alginate dressing I think that still the best way to go. 2. I am also can recommend that he continue with the rolled gauze to secure in place over top of this. 3. I  would also recommend he continue with open toe surgical shoe at this point. We will see patient back for reevaluation in 1 week here in the clinic. If anything worsens or changes patient will contact our office for additional recommendations. Electronic Signature(s) Signed: 03/11/2020 11:00:28 AM By: Worthy Keeler PA-C Entered By: Worthy Keeler on 03/11/2020 11:00:28 Timothy Gibson, Timothy Gibson ([id number]) -------------------------------------------------------------------------------- SuperBill Details Patient Name: Timothy Gibson Date of Service: 03/11/2020 Medical Record Number: [id number] Patient Account Number: [id number] Date of Birth/Sex: 06/15/1951 (69 y.o. M) Treating RN: Cornell Barman Primary Care Provider: Merrie Roof Other Clinician: Referring Provider: Merrie Roof Treating Provider/Extender: Melburn Hake, Leaner Morici Weeks in Treatment: 34 Diagnosis Coding ICD-10 Codes Code Description [phone] Other chronic osteomyelitis, left ankle and foot E11.621 Type 2 diabetes mellitus with foot ulcer L97.522 Non-pressure chronic ulcer of other part of left foot with fat layer exposed I10 Essential (primary) hypertension Facility Procedures CPT4 Code: [id number] Description: 36629 - DEB SUBQ TISSUE 20 SQ CM/< Modifier: Quantity: 1 CPT4 Code: Description: ICD-10 Diagnosis Description L97.522 Non-pressure chronic ulcer of other part of left foot with fat layer exp Modifier: osed Quantity: Physician Procedures CPT4 Code: 4765465 Description: 11042 - WC PHYS SUBQ TISS 20 SQ CM Modifier: Quantity: 1 CPT4 Code: Description: ICD-10 Diagnosis Description L97.522 Non-pressure chronic ulcer of other part of left foot with fat layer exp Modifier: osed Quantity: Electronic Signature(s) Signed: 03/11/2020 11:00:40 AM By: Worthy Keeler PA-C Entered By: Worthy Keeler on 03/11/2020 11:00:38

## 2020-03-12 DIAGNOSIS — D631 Anemia in chronic kidney disease: Secondary | ICD-10-CM | POA: Diagnosis not present

## 2020-03-12 DIAGNOSIS — Z992 Dependence on renal dialysis: Secondary | ICD-10-CM | POA: Diagnosis not present

## 2020-03-12 DIAGNOSIS — D509 Iron deficiency anemia, unspecified: Secondary | ICD-10-CM | POA: Diagnosis not present

## 2020-03-12 DIAGNOSIS — N186 End stage renal disease: Secondary | ICD-10-CM | POA: Diagnosis not present

## 2020-03-12 DIAGNOSIS — N2581 Secondary hyperparathyroidism of renal origin: Secondary | ICD-10-CM | POA: Diagnosis not present

## 2020-03-12 NOTE — Progress Notes (Signed)
Timothy Gibson, GUGLIELMO ([id number]) Visit Report for 03/11/2020 Arrival Information Details Patient Name: Timothy Gibson, DOREN Date of Service: 03/11/2020 9:15 AM Medical Record Number: [id number] Patient Account Number: [id number] Date of Birth/Sex: Jun 28, 1951 (69 y.o. M) Treating RN: Grover Canavan Primary Care Lashane Whelpley: Merrie Roof Other Clinician: Referring Javante Nilsson: Merrie Roof Treating Trentan Trippe/Extender: Melburn Hake, [name] Weeks in Treatment: 64 Visit Information History Since Last Visit Added or deleted any medications: No Patient Arrived: Kasandra Knudsen Had a fall or experienced change in No Arrival Time: 09:22 activities of daily living that may affect Accompanied By: self risk of falls: Transfer Assistance: None Hospitalized since last visit: No Patient Requires Transmission-Based Precautions: No Has Dressing in Place as Prescribed: Yes Patient Has Alerts: No Pain Present Now: No Electronic Signature(s) Signed: 03/11/2020 4:51:51 PM By: Grover Canavan Entered By: Grover Canavan on 03/11/2020 09:22:32 AMAAR, OSHITA ([id number]) -------------------------------------------------------------------------------- Encounter Discharge Information Details Patient Name: Timothy Gibson Date of Service: 03/11/2020 9:15 AM Medical Record Number: [id number] Patient Account Number: [id number] Date of Birth/Sex: 11/03/1950 (69 y.o. M) Treating RN: Grover Canavan Primary Care Rylea Selway: Merrie Roof Other Clinician: Referring Floride Hutmacher: Merrie Roof Treating Loralie Malta/Extender: Melburn Hake, [name] Weeks in Treatment: 77 Encounter Discharge Information Items Post Procedure Vitals Discharge Condition: Stable Temperature (F): 98.3 Ambulatory Status: Cane Pulse (bpm): 85 Discharge Destination: Home Respiratory Rate (breaths/min): 16 Transportation: Private Auto Blood Pressure (mmHg): 190/84 Accompanied By: self Schedule Follow-up Appointment: Yes Clinical Summary of Care: Electronic  Signature(s) Signed: 03/11/2020 4:51:51 PM By: Grover Canavan Entered By: Grover Canavan on 03/11/2020 09:46:34 Timothy Gibson ([id number]) -------------------------------------------------------------------------------- Lower Extremity Assessment Details Patient Name: Timothy Gibson Date of Service: 03/11/2020 9:15 AM Medical Record Number: [id number] Patient Account Number: [id number] Date of Birth/Sex: 1951-01-31 (69 y.o. M) Treating RN: Grover Canavan Primary Care Kynzie Polgar: Merrie Roof Other Clinician: Referring Rafaelita Foister: Merrie Roof Treating Iris Tatsch/Extender: Melburn Hake, [name] Weeks in Treatment: 34 Electronic Signature(s) Signed: 03/11/2020 4:51:51 PM By: Grover Canavan Entered By: Grover Canavan on 03/11/2020 09:30:25 AYDIN, HINK ([id number]) -------------------------------------------------------------------------------- Multi Wound Chart Details Patient Name: Timothy Gibson Date of Service: 03/11/2020 9:15 AM Medical Record Number: [id number] Patient Account Number: [id number] Date of Birth/Sex: 01-01-1951 (69 y.o. M) Treating RN: Grover Canavan Primary Care Deshay Kirstein: Merrie Roof Other Clinician: Referring Scherrie Seneca: Merrie Roof Treating Savalas Monje/Extender: Melburn Hake, [name] Weeks in Treatment: 34 Vital Signs Height(in): 76 Pulse(bpm): 85 Weight(lbs): 244 Blood Pressure(mmHg): 190/84 Body Mass Index(BMI): 30 Temperature(F): 98.3 Respiratory Rate(breaths/min): 16 Photos: [N/A:N/A] Wound Location: Left Toe Great N/A N/A Wounding Event: Gradually Appeared N/A N/A Primary Etiology: Diabetic Wound/Ulcer of the Lower N/A N/A Extremity Comorbid History: Lymphedema, Arrhythmia, N/A N/A Hypertension, Type II Diabetes, End Stage Renal Disease, Rheumatoid Arthritis, Osteoarthritis, Confinement Anxiety Date Acquired: 06/19/2019 N/A N/A Weeks of Treatment: 34 N/A N/A Wound Status: Open N/A N/A Measurements L x W x D (cm) 0.4x0.3x0.1 N/A N/A Area (cm) :  0.094 N/A N/A Volume (cm) : 0.009 N/A N/A % Reduction in Area: 96.50% N/A N/A % Reduction in Volume: 96.70% N/A N/A Classification: Grade 2 N/A N/A Exudate Amount: Medium N/A N/A Exudate Type: Serous N/A N/A Exudate Color: amber N/A N/A Wound Margin: Thickened N/A N/A Granulation Amount: None Present (0%) N/A N/A Necrotic Amount: Small (1-33%) N/A N/A Exposed Structures: Fat Layer (Subcutaneous Tissue): N/A N/A Yes Fascia: No Tendon: No Muscle: No Joint: No Bone: No Epithelialization: Medium (34-66%) N/A N/A Treatment Notes Electronic Signature(s) Signed: 03/11/2020 4:51:51 PM By: Grover Canavan Entered By: Grover Canavan on 03/11/2020 09:34:25 Timothy Gibson ([id number]) --------------------------------------------------------------------------------  Multi-Disciplinary Care Plan Details Patient Name: MAMOUDOU, MULVEHILL Date of Service: 03/11/2020 9:15 AM Medical Record Number: [id number] Patient Account Number: [id number] Date of Birth/Sex: 1951-06-24 (69 y.o. M) Treating RN: Grover Canavan Primary Care Audelia Knape: Merrie Roof Other Clinician: Referring Ia Leeb: Merrie Roof Treating Hurschel Paynter/Extender: Melburn Hake, [name] Weeks in Treatment: 34 Active Inactive Abuse / Safety / Falls / Self Care Management Nursing Diagnoses: Potential for falls Goals: Patient will not experience any injury related to falls Date Initiated: 07/15/2019 Target Resolution Date: 10/18/2019 Goal Status: Active Interventions: Assess fall risk on admission and as needed Notes: Necrotic Tissue Nursing Diagnoses: Impaired tissue integrity related to necrotic/devitalized tissue Goals: Necrotic/devitalized tissue will be minimized in the wound bed Date Initiated: 07/15/2019 Target Resolution Date: 10/18/2019 Goal Status: Active Interventions: Provide education on necrotic tissue and debridement process Notes: Orientation to the Wound Care Program Nursing Diagnoses: Knowledge deficit related  to the wound healing center program Goals: Patient/caregiver will verbalize understanding of the [facility] Program Date Initiated: 07/15/2019 Target Resolution Date: 10/18/2019 Goal Status: Active Interventions: Provide education on orientation to the wound center Notes: Peripheral Neuropathy Nursing Diagnoses: Knowledge deficit related to disease process and management of peripheral neurovascular dysfunction Goals: Patient/caregiver will verbalize understanding of disease process and disease management Date Initiated: 07/15/2019 Target Resolution Date: 10/18/2019 Goal Status: Active SAHEJ, SCHRIEBER ([id number]) Interventions: Assess signs and symptoms of neuropathy upon admission and as needed Provide education on Management of Neuropathy and Related Ulcers Notes: Wound/Skin Impairment Nursing Diagnoses: Impaired tissue integrity Goals: Ulcer/skin breakdown will heal within 14 weeks Date Initiated: 07/15/2019 Target Resolution Date: 10/18/2019 Goal Status: Active Interventions: Assess patient/caregiver ability to obtain necessary supplies Assess patient/caregiver ability to perform ulcer/skin care regimen upon admission and as needed Assess ulceration(s) every visit Notes: Electronic Signature(s) Signed: 03/11/2020 4:51:51 PM By: Grover Canavan Entered By: Grover Canavan on 03/11/2020 09:34:08 Timothy Gibson ([id number]) -------------------------------------------------------------------------------- Pain Assessment Details Patient Name: Timothy Gibson Date of Service: 03/11/2020 9:15 AM Medical Record Number: [id number] Patient Account Number: [id number] Date of Birth/Sex: Mar 04, 1951 (68 y.o. M) Treating RN: Grover Canavan Primary Care Carroll Lingelbach: Merrie Roof Other Clinician: Referring Jameeka Marcy: Merrie Roof Treating Aliceson Dolbow/Extender: Melburn Hake, [name] Weeks in Treatment: 42 Active Problems Location of Pain Severity and Description of Pain Patient Has  Paino No Site Locations Pain Management and Medication Current Pain Management: Electronic Signature(s) Signed: 03/11/2020 4:51:51 PM By: Grover Canavan Entered By: Grover Canavan on 03/11/2020 09:23:25 Timothy Gibson ([id number]) -------------------------------------------------------------------------------- Patient/Caregiver Education Details Patient Name: Timothy Gibson Date of Service: 03/11/2020 9:15 AM Medical Record Number: [id number] Patient Account Number: [id number] Date of Birth/Gender: 12-Aug-1950 (69 y.o. M) Treating RN: Grover Canavan Primary Care Physician: Merrie Roof Other Clinician: Referring Physician: Merrie Roof Treating Physician/Extender: Sharalyn Ink in Treatment: 52 Education Assessment Education Provided To: Patient Education Topics Provided Wound/Skin Impairment: Handouts: Caring for Your Ulcer Methods: Explain/Verbal Responses: State content correctly Electronic Signature(s) Signed: 03/11/2020 4:51:51 PM By: Grover Canavan Entered By: Grover Canavan on 03/11/2020 09:45:23 HENDRIK, DONATH ([id number]) -------------------------------------------------------------------------------- Wound Assessment Details Patient Name: Timothy Gibson Date of Service: 03/11/2020 9:15 AM Medical Record Number: [id number] Patient Account Number: [id number] Date of Birth/Sex: 06-05-1951 (69 y.o. M) Treating RN: Grover Canavan Primary Care Brantly Kalman: Merrie Roof Other Clinician: Referring Javonn Gauger: Merrie Roof Treating Jaye Saal/Extender: Melburn Hake, [name] Weeks in Treatment: 34 Wound Status Wound Number: 3 Primary Diabetic Wound/Ulcer of the Lower Extremity Etiology: Wound Location: Left Toe Great Wound Open Wounding Event: Gradually Appeared Status: Date Acquired: 06/19/2019  Comorbid Lymphedema, Arrhythmia, Hypertension, Type II Weeks Of Treatment: 34 History: Diabetes, End Stage Renal Disease, Rheumatoid Arthritis, Clustered Wound:  No Osteoarthritis, Confinement Anxiety Photos Wound Measurements Length: (cm) 0.4 % Redu Width: (cm) 0.3 % Redu Depth: (cm) 0.1 Epithe Area: (cm) 0.094 Tunne Volume: (cm) 0.009 Under ction in Area: 96.5% ction in Volume: 96.7% lialization: Medium (34-66%) ling: No mining: No Wound Description Classification: Grade 2 Foul Wound Margin: Thickened Sloug Exudate Amount: Medium Exudate Type: Serous Exudate Color: amber Odor After Cleansing: No h/Fibrino Yes Wound Bed Granulation Amount: None Present (0%) Exposed Structure Necrotic Amount: Small (1-33%) Fascia Exposed: No Necrotic Quality: Adherent Slough Fat Layer (Subcutaneous Tissue) Exposed: Yes Tendon Exposed: No Muscle Exposed: No Joint Exposed: No Bone Exposed: No Treatment Notes Wound #3 (Left Toe Great) Notes Silver alginate, conform, offloading shoe Electronic Signature(s) ROBBERT, LANGLINAIS ([id number]) Signed: 03/11/2020 4:51:51 PM By: Grover Canavan Entered By: Grover Canavan on 03/11/2020 09:30:07 Timothy Gibson ([id number]) -------------------------------------------------------------------------------- Vitals Details Patient Name: Timothy Gibson Date of Service: 03/11/2020 9:15 AM Medical Record Number: [id number] Patient Account Number: [id number] Date of Birth/Sex: April 18, 1951 (69 y.o. M) Treating RN: Grover Canavan Primary Care Ojas Coone: Merrie Roof Other Clinician: Referring Johny Pitstick: Merrie Roof Treating Tenita Cue/Extender: Melburn Hake, [name] Weeks in Treatment: 34 Vital Signs Time Taken: 09:22 Temperature (F): 98.3 Height (in): 76 Pulse (bpm): 85 Weight (lbs): 244 Respiratory Rate (breaths/min): 16 Body Mass Index (BMI): 29.7 Blood Pressure (mmHg): 190/84 Reference Range: 80 - 120 mg / dl Electronic Signature(s) Signed: 03/11/2020 4:51:51 PM By: Grover Canavan Entered By: Grover Canavan on 03/11/2020 09:23:18

## 2020-03-15 DIAGNOSIS — D509 Iron deficiency anemia, unspecified: Secondary | ICD-10-CM | POA: Diagnosis not present

## 2020-03-15 DIAGNOSIS — N2581 Secondary hyperparathyroidism of renal origin: Secondary | ICD-10-CM | POA: Diagnosis not present

## 2020-03-15 DIAGNOSIS — D631 Anemia in chronic kidney disease: Secondary | ICD-10-CM | POA: Diagnosis not present

## 2020-03-15 DIAGNOSIS — N186 End stage renal disease: Secondary | ICD-10-CM | POA: Diagnosis not present

## 2020-03-15 DIAGNOSIS — Z992 Dependence on renal dialysis: Secondary | ICD-10-CM | POA: Diagnosis not present

## 2020-03-17 DIAGNOSIS — N2581 Secondary hyperparathyroidism of renal origin: Secondary | ICD-10-CM | POA: Diagnosis not present

## 2020-03-17 DIAGNOSIS — D509 Iron deficiency anemia, unspecified: Secondary | ICD-10-CM | POA: Diagnosis not present

## 2020-03-17 DIAGNOSIS — Z992 Dependence on renal dialysis: Secondary | ICD-10-CM | POA: Diagnosis not present

## 2020-03-17 DIAGNOSIS — D631 Anemia in chronic kidney disease: Secondary | ICD-10-CM | POA: Diagnosis not present

## 2020-03-17 DIAGNOSIS — N186 End stage renal disease: Secondary | ICD-10-CM | POA: Diagnosis not present

## 2020-03-18 ENCOUNTER — Encounter: Payer: Medicare Other | Admitting: Physician Assistant

## 2020-03-18 ENCOUNTER — Other Ambulatory Visit: Payer: Self-pay

## 2020-03-18 DIAGNOSIS — F17290 Nicotine dependence, other tobacco product, uncomplicated: Secondary | ICD-10-CM | POA: Diagnosis not present

## 2020-03-18 DIAGNOSIS — I10 Essential (primary) hypertension: Secondary | ICD-10-CM | POA: Diagnosis not present

## 2020-03-18 DIAGNOSIS — E11621 Type 2 diabetes mellitus with foot ulcer: Secondary | ICD-10-CM | POA: Diagnosis not present

## 2020-03-18 DIAGNOSIS — E1169 Type 2 diabetes mellitus with other specified complication: Secondary | ICD-10-CM | POA: Diagnosis not present

## 2020-03-18 DIAGNOSIS — M86672 Other chronic osteomyelitis, left ankle and foot: Secondary | ICD-10-CM | POA: Diagnosis not present

## 2020-03-18 DIAGNOSIS — L97522 Non-pressure chronic ulcer of other part of left foot with fat layer exposed: Secondary | ICD-10-CM | POA: Diagnosis not present

## 2020-03-18 NOTE — Progress Notes (Addendum)
MAEL, DELAP ([id number]) Visit Report for 03/18/2020 Chief Complaint Document Details Patient Name: Timothy Gibson, Timothy Gibson Date of Service: 03/18/2020 10:45 AM Medical Record Number: [id number] Patient Account Number: [id number] Date of Birth/Sex: 1951-02-26 (69 y.o. M) Treating RN: Cornell Barman Primary Care Provider: Merrie Roof Other Clinician: Referring Provider: Merrie Roof Treating Provider/Extender: Melburn Hake, Wen Merced Weeks in Treatment: 35 Information Obtained from: Patient Chief Complaint Left Great Toe Electronic Signature(s) Signed: 03/18/2020 10:46:40 AM By: Worthy Keeler PA-C Entered By: Worthy Keeler on 03/18/2020 10:46:39 KEVIN, SPACE ([id number]) -------------------------------------------------------------------------------- HPI Details Patient Name: Timothy Gibson Date of Service: 03/18/2020 10:45 AM Medical Record Number: [id number] Patient Account Number: [id number] Date of Birth/Sex: [birth year] (69 y.o. M) Treating RN: Cornell Barman Primary Care Provider: Merrie Roof Other Clinician: Referring Provider: Merrie Roof Treating Provider/Extender: Melburn Hake, Zoe Goonan Weeks in Treatment: 35 History of Present Illness HPI Description: 11/27/17 on evaluation today patient presents for initial evaluation concerning an issue that he has been having with his right great toe which began last Thursday 11/22/17. He has a history of diabetes mellitus type to which he has had for greater than 30 years currently he is on insulin. Subsequently he also has a history of hypertension. On physical exam inspection is also appears he may have some peripheral vascular disease with his ABI's being noncompressible bilaterally. He is on dialysis as well and this is on Monday, Wednesday, and Friday. Patient was hospitalized back in January/February 2019 although this was more for stomach/back pain though they never told me exactly what was going on. This is according to the patient. Subsequently  the ulcer which is between the third and fourth toe when space of the right foot as well is on the plantar aspect of the fourth toe is due to him having cleaned his toes this morning and he states that his finger which is large split the toe tissue in between causing the wounds that we currently see. With that being said he states this has happened before I explained I would definitely recommend that he not do this any longer. He can use a small soft washcloth to clean between the toes without causing trauma. Subsequently the right great toe hatchery does show evidence of necrotic tissue present on the surface of the wound specifically there is callous and dead skin surrounding which is trapping fluid causing problems as far as the wound is concerned. The surface of the wound does show slough although due to his vascular flow I'm not going to sharply debride this today I think I will selectively debride away the necrotic skin as well is callous from around the surrounding so this will not continue to be a moisture issue. Nonetheless the patient has no pain he does have diabetic neuropathy. 12/06/17-He is here in follow-up evaluation for a right great toe ulcer. He is voicing no complaints or concerns. He continues to infrequently/socially smokes cigars with no desire to quit. He has been compliant with offloading, using open toed surgical shoe; has been compliant using Santyl daily. He continues on clindamycin although takes it inconsistently secondary to indigestion. The plain film x-ray performed on 5/23 impression: Soft tissue swelling of the left first toe and is noted with probable irregular lucency involving distal tuft of first distal phalanx concerning for osteomyelitis. MRI may be performed for further evaluation; MRI ordered. The vascular evaluation performed on 5/24 field non- compressible ABIs bilaterally and reduced TBI bilaterally suggesting significant tibial disease. He will be referred  to  vascular medicine for further evaluation. 12/13/17-He is here in follow-up evaluation for right great toe ulcer. He has appointment next Thursday for the MRI and vascular evaluation. Wound culture obtained last week grew abundant Klebsiella oxytoca (multidrug sensitivity), and abundant enteric coccus faecalis (sensitive to ampicillin). Ampicillin and Cipro were called in, along with a probiotic. In light of his appointments next Thursday he will follow-up in 2 weeks, we will continue with Santyl 12/27/17-He is here for evaluation for a right great toe ulcer. 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 He did have evaluation by vascular medicine, Dr. Ronalee Belts, on 6/13, at that appointment it was decided for him to undergo angiography with possible intervention but he refused and is still considering. If he chooses to have it done he will contact the office. He has not heard back from either ID offices that he was referred to two weeks ago: [place] and [place]. there is improvement in both appearance and measurement to the ulcer. We will extend antibiotic therapy (amoxicillin 500 every 12, Cipro 500 daily) for additional 2 weeks pending infectious disease consult, he will continue with Santyl daily. He was reminded to continue with probiotic therapy while on oral antibiotics; he denies any GI disturbance. 01/03/18-He is here in follow-up evaluation for right great toe ulcer. He is decided to not undergo any vascular intervention at this time. He was established with [facility] infectious disease (Dr. Linus Salmons) last week initiated on vancomycin and cefazolin with dialysis treatment for 6 weeks. We will continue with current treatment plan with Santyl and offloading and he will follow-up in 2 weeks 01/17/18-He is seen in follow-up evaluation for right great toe ulcer. There is improvement in appearance with less nonviable tissue  present. We will continue with same treatment plan he will follow-up next week. He is tolerating IV antibiotics without any complications 2/67/12-WP is seen in follow up evaluation for right great toe ulcer. There continues to be improvement. He is tolerating IV antibiotics with hemodialysis, completion date of 7/31. We will transition to collagen and and follow-up next week 02/07/18-He is here in follow up for a right great toe ulcer. There is red granulation tissue throughout the wound, improved in appearance. He completed antibiotics yesterday. He saw podiatry last Thursday for nail trimming, at that appointment he periwound callus was trimmed and a culture was obtained; culture negative. He does not have a follow up appointment with infectious disease. We will submit for grafix. 02/14/18-He is here in follow-up evaluation for a right great toe ulcer. There is slow improvement, red granulation tissue throughout. The insurance approval for grafix is pending. We will continue with collagen and he'll follow-up next week 02/21/18-He is seen in follow-up evaluation for right great toe ulcer. There is improvement with red granulation tissue throughout. He was approved for oasis and this was placed today. He will follow up next week 02/28/18-He is seen in follow-up evaluation for right great toe ulcer. The wound is stable, oasis applied and he'll follow-up next week 03/07/18-He is seen in follow-up violation of her right great toe ulcer. The wound is dry, no evidence of drainage. The wound appears healed today. He was painted with Betadine, instructed to paint with Betadine daily, cover with band-aid for the next week and then cover with band-aid for the following week continue with surgical shoe. He was encouraged to contact the clinic with any evidence of drainage, or change in appearance to toe/wound.  He will be discharged from wound care services Readmission: 07/14/18 on evaluation today patient presents for  initial evaluation our clinic concerning issues that he is having with his left great toe. We previously saw him in 2019 for his right great toe which was actually worse at that time and subsequently was able to be completely healed in the end although it did take several months. With that being said the patient is currently having an area on his left great toe that occurred initially as result of a blister that came up he's not really sure why or how. Subsequently this was initially treated by his podiatrist though the recommendation there was apparently amputation according to the patient and his daughter who was present with him today. With that being said the patient did not want to proceed with amputation and would like to try to perform wound care and get this to heal without having to go down that road. We were able to do that with his other toe and so he would like to at least give that a try before going forward with amputation. Again I completely understand his concern in this regard. With that being said I did discuss with the patient obviously that it's always a chance that we cannot get this to heal with normal wound care measures but obviously we will give it our best shot. He does actually have an appointment scheduled for later today with the vascular specialist in order to evaluate his blood flow. That was at the recommendation of his podiatrist as well. Obviously if he is having good arterial flow and nonetheless is still experiencing issues with having the wound delay in healing then it may simply be a matter of we need to initiate more aggressive wound to therapy in order to see if we can get this to heal. No fevers, chills, DAVIDLEE, JEANBAPTISTE. ([id number]) nausea, or vomiting noted at this time. The patient notes that this woman has been present for approximately three weeks. He is not a smoker. His most recent hemoglobin A1c was 6.2 on 06/18/19. 07/17/2019 on evaluation today patient  appears unfortunately to not be doing as well. He did see vascular I did review the note as well. That was on 07/15/2019. Subsequently there can actually be taking him to the OR for an angiogram due to poor arterial flow into this extremity. Apparently the atherosclerotic changes were severe. The patient is stated to be at risk for limb loss. Nonetheless the good news is this is being scheduled fairly quickly he will have the surgery/procedure next Thursday. With that being said I am going also go ahead based on the results of his x-ray which showed some chance of osteomyelitis in the distal portion of the toe get the patient set up for an MRI to further evaluate for the possibility of osteomyelitis obviously if he does have osteomyelitis we need to know so that we can address this appropriately. 07/29/2019 upon evaluation today patient appears to be doing better in general with regard to his toe ulcer. He does have much better blood flow you can actually feel a palpable pulse which appears to be very strong at this time. I did review his arterial intervention/angiogram which did show that he had evidence of sufficient arterial flow restoration at this point. He did have occlusions that were 60 and 80% that residually were only 10 and 25% with no limiting flow. This is excellent news and hopefully he will continue to show signs of  improvement in light of the improved vascular status. With that being said the toe mainly shows somewhat of an eschar today I think that this is fairly stable and as such probably would be best not to really do much in the way of debridement at this point I do believe Betadine would likely be a good option. 08/07/2019 on evaluation today patient actually appears to be doing about the same. The one difference is the eschar that we were just seeing if we can maintain is actually starting to loosen up and leak around the edges I think it is time to actually go ahead and remove this  today. Is also no longer stable is very soft. Nonetheless the patient is okay with proceeding with debridement at this point. Fortunately there is no signs of active infection. No fevers, chills, nausea, vomiting, or diarrhea. The patient states that he took his last antibiotic that he had last night. He would need a refill as he will not be seeing infectious disease until February 2. Nonetheless obviously I do not want him to have any break in therapy especially when he is doing as well as he is currently. 08/14/2019 on evaluation today patient appears to be doing quite well all things considered in regard to his toe. I do feel like he is making progress and though this is not completely healed by any means we still have some ways to go I feel like he is making progress which is excellent. There is no sign of active infection at this time systemically. He does have osteomyelitis of the distal phalanx of his great toe on the left. He did see Dr. Linus Salmons. That is the infectious disease doctor in [place] where he was referred. Dr. Linus Salmons apparently according to the note did not feel like the patient's MRI revealed osteomyelitis but just reactive marrow and subsequently did not recommend any antibiotic therapy whatsoever. To be peripherally honest I am not really in agreement with this based on the MRI results and what we are seeing and I think he has been responding at least decently well to oral medication as well. Nonetheless we may want to check into the possibility of IV antibiotics being administered at the dialysis center. I will discuss this with Dr. Dellia Nims further. 08/21/2019 upon evaluation today patient appears at this point to be making some progress with regard to his wounds. He has been tolerating the dressing changes without complication. Fortunately there is no evidence of active infection at this time. Overall I feel like he is doing well with the oral antibiotics I discussed with Dr.  Dellia Nims the possibility of doing IV antibiotic therapy versus continuing with the oral. His opinion was if the patient is doing well with oral to maybe stick with that. Obviously we can initiate additional Cipro as well just to help ensure that there is nothing worsening here. The Cipro should be beneficial for him as well. That helps cover more gram-negative's were is at the doxycycline is more gram- positive's. 09/02/2019 on evaluation today patient appears to be doing fairly well upon inspection today. With that being said though the patient is continuing to do well he does also continue to develop issues with buildup of necrotic tissue on the distal portion of his toe he does have good arterial flow however. For that reason I am going to suggest that what we may need to do is consider using something such as Santyl to try to keep this free and clear. Also discussed  in greater detail HBO therapy with him today please see plan for additional recommendations and results of this discussion. 09/11/2019 upon evaluation today patient appears to be doing well with regard to his toe ulcer. This actually appears to be much better today using the Santyl than it was previous. Fortunately there is no signs of active infection. No fevers, chills, nausea, vomiting, or diarrhea. 09/18/2019 upon evaluation today patient appears to be doing much better in regard to his wound in general. The Santyl was doing a good job at loosening stuff up here for [place] which is excellent news. With that being said he is still taking the oral antibiotic. I sent this in last for him on 09/03/2019. This was the doxycycline. When this runs out he will actually be complete as far as the treatment is concerned. With that being said I am getting continue the Cipro for 1 additional month. Overall his wound seems to be doing quite well. 09/25/2019 upon evaluation today patient appears to be doing well with regard to his toe ulcer. This is measuring  much better and overall seems to be making good progress. Fortunately there is no signs of active infection at this time. No fevers, chills, nausea, vomiting, or diarrhea. 10/02/2019 upon evaluation today patient appears to be doing well with regard to his wound. He has been tolerating the dressing changes without complication. Fortunately there is no signs of active infection at this time. No fevers, chills, nausea, vomiting, or diarrhea. Overall I am extremely pleased with how things seem to be progressing. I think the wound bed is at the point now where we can likely proceed with additional measures to try to get this healed more quickly I think Dermagraft may be a good possibility for the patient. We can definitely look into seeing about approval for this. 10/09/2019 upon evaluation today patient's wound actually appears to be doing well although it still is progressing somewhat slowly compared to what I would like to see. With that being said I did order Apligraf for the patient which I felt would keep the wound nice and moist without allowing it to dry out and hopefully allow this to heal much more effectively and quickly. The patient actually did obtain approval and therefore we can apply that today. 10/23/2019 upon evaluation today patient's wound actually appears to be doing significantly better with regard to his toe ulcer. The tissue at the base of the wound is greatly improved and overall I am very pleased with the progress made. We did use Apligraf 2 weeks ago on the toe that did excellent for him much it has completely dissolved after as of the 2 weeks. They only had to change the dressing a few times due to drainage through. Obviously they left the Steri-Strips and Mepitel in place. Unfortunately we do not have the Apligraf available today for application regard therefore have to use a different product here in the clinic today and then order the Apligraf for next Thursday. That will be his  second application. 4/22; patient arrived with overhanging skin and subcutaneous tissue on the wound margins this was removed. Apligraf #2 applied 11/13/2019 upon evaluation today patient actually appears to be doing well with regard to his foot ulcer. He has been tolerating the dressing changes without complication. The overall wound bed appears to be healthier and has filled in as well as not nearly as deep as it was the tissue is also better quality. Overall I am extremely pleased with how things  seem to be progressing. The patient is likewise very happy in this regard. 5/20; wound is measuring smaller had Apligraf #3 last time. Apligraf #4 today TRACI, PLEMONS ([id number]) 12/12/2019 upon evaluation today patient actually appears to be doing quite well with regard to his wound in my opinion. The ulcer is significantly smaller compared to last time I saw him although that has been several weeks. I do believe the Apligraf has been beneficial he has 1 remaining today this will be application #5. 03/03/538 upon evaluation today patient appears to be doing well with regard to his toe ulcer. He is measuring about the same today but he has come a long way from where this started. Fortunately there is no evidence of active infection which is great news and overall I am very pleased with the progress that is been made. Nonetheless at this point we have completed the Apligraf course he did see improvement through this but I am going to switch to something different as of today. 01/08/2020 upon evaluation today patient appears to be doing much better in regards to his toe ulcer this is measuring smaller and overall very pleased with the progress. The Hydrofera Blue seems to be doing a great job. Fortunately there is no evidence of active infection at this time. No fevers, chills, nausea, vomiting, or diarrhea. 01/29/2020 upon evaluation today patient appears to be doing okay in regard to his toe ulcer. The  main issue I see is that he actually has trouble right now with callus growing and covering over the wound bed. This prevents him from being able to get a dressing in contact with the wound bed and subsequently fluid collection underneath. I think that we probably need to see him more frequently to keep this pared down and debrided to the point to allow the dressings to effectively heal this wound. I discussed that with the patient today. 02/05/2020 on evaluation today patient appears to be doing well with regard to his wounds currently. In fact he just has 1 on his toe remaining at this point he seems to be doing quite well in my opinion. I am very pleased with the overall appearance today. There is no signs of active infection I do think keeping the callus trimmed down has been of great benefit as far as healing is concerned. I do not think we need to go 3 weeks out like we were in the past I believe that is been somewhat more detrimental than good for him. 02/19/2020 upon evaluation today patient appears to be doing better in regard to his toe ulcer still there is callus buildup around the top of the toe which unfortunately is I think causing some issues here with trapping a little fluid underneath them after cleaning this away. Fortunately there is no evidence of active infection at this time. Overall I feel like the patient is doing quite well which is great news. 03/04/2020 on evaluation today patient appears to be doing well with regard to his toe ulcer though he still has a lot of drying out of the endoform and a lot of callus buildup as well unfortunately. With that being said I think that he is can require some sharp debridement to clear away some of the callus today. We will see what the wound looks like following. 03/11/2020 on evaluation today patient appears to be doing excellent in regard to his toe ulcer. He did have a lot of callus and even some of the nail, growing over  top of the area  where the wound is trying to heal. For that reason I did actually trim back some of the nail also did go ahead and trim away some of the callus as well. He tolerated all this without complication the wound appears to be doing well and is showing no signs of infection at this point. Overall I am very pleased. 03/18/2020 upon evaluation today patient appears to be doing well with regard to his toe ulcer. There does not appear to be any significant buildup of callus at this point and overall I do believe that the alginate has been beneficial. Trimming back the toenail also seems to have helped a lot in this regard I think that was causing some rubbing on the end of the toe. Fortunately overall he is doing much better unfortunately he notes that his grandson has been placed on hospice he is 42 and apparently not doing too well. He wants to make a trip to go see him I really think that something he probably should do and not wait for this to try to heal. Electronic Signature(s) Signed: 03/18/2020 11:26:39 AM By: Worthy Keeler PA-C Entered By: Worthy Keeler on 03/18/2020 11:26:38 LONNIE, RETH ([id number]) -------------------------------------------------------------------------------- Physical Exam Details Patient Name: Timothy Gibson Date of Service: 03/18/2020 10:45 AM Medical Record Number: [id number] Patient Account Number: [id number] Date of Birth/Sex: Dec 21, 1950 (69 y.o. M) Treating RN: Cornell Barman Primary Care Provider: Merrie Roof Other Clinician: Referring Provider: Merrie Roof Treating Provider/Extender: Melburn Hake, Kelita Wallis Weeks in Treatment: 75 Constitutional Well-nourished and well-hydrated in no acute distress. Respiratory normal breathing without difficulty. Psychiatric this patient is able to make decisions and demonstrates good insight into disease process. Alert and Oriented x 3. pleasant and cooperative. Notes Upon inspection patient's wound bed actually showed signs of  fairly good granulation there was no significant slough buildup and no need for sharp debridement today which is good news. I do believe talking a little bit of the alginate into the wound would be beneficial I discussed this with him last time but again I do not think that got back to his caregiver who has been doing the dressing changes. She states she will do that going forward. Electronic Signature(s) Signed: 03/18/2020 11:27:02 AM By: Worthy Keeler PA-C Entered By: Worthy Keeler on 03/18/2020 11:27:01 EDGE, MAUGER ([id number]) -------------------------------------------------------------------------------- Physician Orders Details Patient Name: Timothy Gibson Date of Service: 03/18/2020 10:45 AM Medical Record Number: [id number] Patient Account Number: [id number] Date of Birth/Sex: 01-28-1951 (69 y.o. M) Treating RN: Grover Canavan Primary Care Provider: Merrie Roof Other Clinician: Referring Provider: Merrie Roof Treating Provider/Extender: Melburn Hake, Gerarda Conklin Weeks in Treatment: 43 Verbal / Phone Orders: No Diagnosis Coding ICD-10 Coding Code Description [phone] Other chronic osteomyelitis, left ankle and foot E11.621 Type 2 diabetes mellitus with foot ulcer L97.522 Non-pressure chronic ulcer of other part of left foot with fat layer exposed I10 Essential (primary) hypertension Wound Cleansing Wound #3 Left Toe Great o Dial antibacterial soap, wash wounds, rinse and pat dry prior to dressing wounds o May Shower, gently pat wound dry prior to applying new dressing. Anesthetic (add to Medication List) Wound #3 Left Toe Great o Topical Lidocaine 4% cream applied to wound bed prior to debridement (In Clinic Only). Primary Wound Dressing Wound #3 Left Toe Great o Silver Alginate Secondary Dressing Wound #3 Left Toe Great o Dry Gauze o Conform/Kerlix Dressing Change Frequency Wound #3 Left Toe Great o Change dressing every other day. o  Other: - As  needed Follow-up Appointments Wound #3 Left Toe Great o Return Appointment in 1 week. Off-Loading Wound #3 Left Toe Great o Open toe surgical shoe with peg assist. - left foot Electronic Signature(s) Signed: 03/18/2020 4:47:46 PM By: Grover Canavan Signed: 03/18/2020 4:57:24 PM By: Worthy Keeler PA-C Entered By: Grover Canavan on 03/18/2020 11:00:58 KAMALI, NEPHEW ([id number]) -------------------------------------------------------------------------------- Problem List Details Patient Name: Timothy Gibson Date of Service: 03/18/2020 10:45 AM Medical Record Number: [id number] Patient Account Number: [id number] Date of Birth/Sex: June 02, 1951 (69 y.o. M) Treating RN: Cornell Barman Primary Care Provider: Merrie Roof Other Clinician: Referring Provider: Merrie Roof Treating Provider/Extender: Melburn Hake, Laurenashley Viar Weeks in Treatment: 35 Active Problems ICD-10 Encounter Code Description Active Date MDM Diagnosis [phone] Other chronic osteomyelitis, left ankle and foot 07/29/2019 No Yes E11.621 Type 2 diabetes mellitus with foot ulcer 07/15/2019 No Yes L97.522 Non-pressure chronic ulcer of other part of left foot with fat layer 07/15/2019 No Yes exposed [place] (primary) hypertension 07/15/2019 No Yes Inactive Problems Resolved Problems Electronic Signature(s) Signed: 03/18/2020 10:46:31 AM By: Worthy Keeler PA-C Entered By: Worthy Keeler on 03/18/2020 10:46:30 Timothy Gibson ([id number]) -------------------------------------------------------------------------------- Progress Note Details Patient Name: Timothy Gibson Date of Service: 03/18/2020 10:45 AM Medical Record Number: [id number] Patient Account Number: [id number] Date of Birth/Sex: 08/09/1950 (69 y.o. M) Treating RN: Cornell Barman Primary Care Provider: Merrie Roof Other Clinician: Referring Provider: Merrie Roof Treating Provider/Extender: Melburn Hake, Heinz Eckert Weeks in Treatment: 35 Subjective Chief  Complaint Information obtained from Patient Left Great Toe History of Present Illness (HPI) 11/27/17 on evaluation today patient presents for initial evaluation concerning an issue that he has been having with his right great toe which began last Thursday 11/22/17. He has a history of diabetes mellitus type to which he has had for greater than 30 years currently he is on insulin. Subsequently he also has a history of hypertension. On physical exam inspection is also appears he may have some peripheral vascular disease with his ABI's being noncompressible bilaterally. He is on dialysis as well and this is on Monday, Wednesday, and Friday. Patient was hospitalized back in January/February 2019 although this was more for stomach/back pain though they never told me exactly what was going on. This is according to the patient. Subsequently the ulcer which is between the third and fourth toe when space of the right foot as well is on the plantar aspect of the fourth toe is due to him having cleaned his toes this morning and he states that his finger which is large split the toe tissue in between causing the wounds that we currently see. With that being said he states this has happened before I explained I would definitely recommend that he not do this any longer. He can use a small soft washcloth to clean between the toes without causing trauma. Subsequently the right great toe hatchery does show evidence of necrotic tissue present on the surface of the wound specifically there is callous and dead skin surrounding which is trapping fluid causing problems as far as the wound is concerned. The surface of the wound does show slough although due to his vascular flow I'm not going to sharply debride this today I think I will selectively debride away the necrotic skin as well is callous from around the surrounding so this will not continue to be a moisture issue. Nonetheless the patient has no pain he does have  diabetic neuropathy. 12/06/17-He is here in follow-up evaluation for  a right great toe ulcer. He is voicing no complaints or concerns. He continues to infrequently/socially smokes cigars with no desire to quit. He has been compliant with offloading, using open toed surgical shoe; has been compliant using Santyl daily. He continues on clindamycin although takes it inconsistently secondary to indigestion. The plain film x-ray performed on 5/23 impression: Soft tissue swelling of the left first toe and is noted with probable irregular lucency involving distal tuft of first distal phalanx concerning for osteomyelitis. MRI may be performed for further evaluation; MRI ordered. The vascular evaluation performed on 5/24 field non- compressible ABIs bilaterally and reduced TBI bilaterally suggesting significant tibial disease. He will be referred to vascular medicine for further evaluation. 12/13/17-He is here in follow-up evaluation for right great toe ulcer. He has appointment next Thursday for the MRI and vascular evaluation. Wound culture obtained last week grew abundant Klebsiella oxytoca (multidrug sensitivity), and abundant enteric coccus faecalis (sensitive to ampicillin). Ampicillin and Cipro were called in, along with a probiotic. In light of his appointments next Thursday he will follow-up in 2 weeks, we will continue with Santyl 12/27/17-He is here for evaluation for a right great toe ulcer. 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 He did have evaluation by vascular medicine, Dr. Ronalee Belts, on 6/13, at that appointment it was decided for him to undergo angiography with possible intervention but he refused and is still considering. If he chooses to have it done he will contact the office. He has not heard back from either ID offices that he was referred to two weeks ago: [place] and [place]. there is improvement in  both appearance and measurement to the ulcer. We will extend antibiotic therapy (amoxicillin 500 every 12, Cipro 500 daily) for additional 2 weeks pending infectious disease consult, he will continue with Santyl daily. He was reminded to continue with probiotic therapy while on oral antibiotics; he denies any GI disturbance. 01/03/18-He is here in follow-up evaluation for right great toe ulcer. He is decided to not undergo any vascular intervention at this time. He was established with [facility] infectious disease (Dr. Linus Salmons) last week initiated on vancomycin and cefazolin with dialysis treatment for 6 weeks. We will continue with current treatment plan with Santyl and offloading and he will follow-up in 2 weeks 01/17/18-He is seen in follow-up evaluation for right great toe ulcer. There is improvement in appearance with less nonviable tissue present. We will continue with same treatment plan he will follow-up next week. He is tolerating IV antibiotics without any complications 1/44/81-EH is seen in follow up evaluation for right great toe ulcer. There continues to be improvement. He is tolerating IV antibiotics with hemodialysis, completion date of 7/31. We will transition to collagen and and follow-up next week 02/07/18-He is here in follow up for a right great toe ulcer. There is red granulation tissue throughout the wound, improved in appearance. He completed antibiotics yesterday. He saw podiatry last Thursday for nail trimming, at that appointment he periwound callus was trimmed and a culture was obtained; culture negative. He does not have a follow up appointment with infectious disease. We will submit for grafix. 02/14/18-He is here in follow-up evaluation for a right great toe ulcer. There is slow improvement, red granulation tissue throughout. The insurance approval for grafix is pending. We will continue with collagen and he'll follow-up next week 02/21/18-He is seen in follow-up evaluation for  right great toe ulcer. There is  improvement with red granulation tissue throughout. He was approved for oasis and this was placed today. He will follow up next week 02/28/18-He is seen in follow-up evaluation for right great toe ulcer. The wound is stable, oasis applied and he'll follow-up next week 03/07/18-He is seen in follow-up violation of her right great toe ulcer. The wound is dry, no evidence of drainage. The wound appears healed today. He was painted with Betadine, instructed to paint with Betadine daily, cover with band-aid for the next week and then cover with band-aid for the following week continue with surgical shoe. He was encouraged to contact the clinic with any evidence of drainage, or change in appearance to toe/wound. He will be discharged from wound care services Readmission: 07/14/18 on evaluation today patient presents for initial evaluation our clinic concerning issues that he is having with his left great toe. We previously saw him in 2019 for his right great toe which was actually worse at that time and subsequently was able to be completely healed in the end although it did take several months. With that being said the patient is currently having an area on his left great toe that occurred initially as result of a blister that came up he's not really sure why or how. Subsequently this was initially treated by his podiatrist though the recommendation there was apparently amputation according to the patient and his daughter who was present with him today. With that being said the patient did not want to proceed with amputation and would like to try to perform wound care and get this to heal without having to go down that road. We were able to do that with his other toe and so he would like to at least give that a try before going forward with amputation. PRESTIN, MUNCH ([id number]) Again I completely understand his concern in this regard. With that being said I did discuss  with the patient obviously that it's always a chance that we cannot get this to heal with normal wound care measures but obviously we will give it our best shot. He does actually have an appointment scheduled for later today with the vascular specialist in order to evaluate his blood flow. That was at the recommendation of his podiatrist as well. Obviously if he is having good arterial flow and nonetheless is still experiencing issues with having the wound delay in healing then it may simply be a matter of we need to initiate more aggressive wound to therapy in order to see if we can get this to heal. No fevers, chills, nausea, or vomiting noted at this time. The patient notes that this woman has been present for approximately three weeks. He is not a smoker. His most recent hemoglobin A1c was 6.2 on 06/18/19. 07/17/2019 on evaluation today patient appears unfortunately to not be doing as well. He did see vascular I did review the note as well. That was on 07/15/2019. Subsequently there can actually be taking him to the OR for an angiogram due to poor arterial flow into this extremity. Apparently the atherosclerotic changes were severe. The patient is stated to be at risk for limb loss. Nonetheless the good news is this is being scheduled fairly quickly he will have the surgery/procedure next Thursday. With that being said I am going also go ahead based on the results of his x-ray which showed some chance of osteomyelitis in the distal portion of the toe get the patient set up for an MRI to further evaluate  for the possibility of osteomyelitis obviously if he does have osteomyelitis we need to know so that we can address this appropriately. 07/29/2019 upon evaluation today patient appears to be doing better in general with regard to his toe ulcer. He does have much better blood flow you can actually feel a palpable pulse which appears to be very strong at this time. I did review his arterial  intervention/angiogram which did show that he had evidence of sufficient arterial flow restoration at this point. He did have occlusions that were 60 and 80% that residually were only 10 and 25% with no limiting flow. This is excellent news and hopefully he will continue to show signs of improvement in light of the improved vascular status. With that being said the toe mainly shows somewhat of an eschar today I think that this is fairly stable and as such probably would be best not to really do much in the way of debridement at this point I do believe Betadine would likely be a good option. 08/07/2019 on evaluation today patient actually appears to be doing about the same. The one difference is the eschar that we were just seeing if we can maintain is actually starting to loosen up and leak around the edges I think it is time to actually go ahead and remove this today. Is also no longer stable is very soft. Nonetheless the patient is okay with proceeding with debridement at this point. Fortunately there is no signs of active infection. No fevers, chills, nausea, vomiting, or diarrhea. The patient states that he took his last antibiotic that he had last night. He would need a refill as he will not be seeing infectious disease until February 2. Nonetheless obviously I do not want him to have any break in therapy especially when he is doing as well as he is currently. 08/14/2019 on evaluation today patient appears to be doing quite well all things considered in regard to his toe. I do feel like he is making progress and though this is not completely healed by any means we still have some ways to go I feel like he is making progress which is excellent. There is no sign of active infection at this time systemically. He does have osteomyelitis of the distal phalanx of his great toe on the left. He did see Dr. Linus Salmons. That is the infectious disease doctor in [place] where he was referred. Dr. Linus Salmons apparently  according to the note did not feel like the patient's MRI revealed osteomyelitis but just reactive marrow and subsequently did not recommend any antibiotic therapy whatsoever. To be peripherally honest I am not really in agreement with this based on the MRI results and what we are seeing and I think he has been responding at least decently well to oral medication as well. Nonetheless we may want to check into the possibility of IV antibiotics being administered at the dialysis center. I will discuss this with Dr. Dellia Nims further. 08/21/2019 upon evaluation today patient appears at this point to be making some progress with regard to his wounds. He has been tolerating the dressing changes without complication. Fortunately there is no evidence of active infection at this time. Overall I feel like he is doing well with the oral antibiotics I discussed with Dr. Dellia Nims the possibility of doing IV antibiotic therapy versus continuing with the oral. His opinion was if the patient is doing well with oral to maybe stick with that. Obviously we can initiate additional Cipro as well  just to help ensure that there is nothing worsening here. The Cipro should be beneficial for him as well. That helps cover more gram-negative's were is at the doxycycline is more gram- positive's. 09/02/2019 on evaluation today patient appears to be doing fairly well upon inspection today. With that being said though the patient is continuing to do well he does also continue to develop issues with buildup of necrotic tissue on the distal portion of his toe he does have good arterial flow however. For that reason I am going to suggest that what we may need to do is consider using something such as Santyl to try to keep this free and clear. Also discussed in greater detail HBO therapy with him today please see plan for additional recommendations and results of this discussion. 09/11/2019 upon evaluation today patient appears to be doing  well with regard to his toe ulcer. This actually appears to be much better today using the Santyl than it was previous. Fortunately there is no signs of active infection. No fevers, chills, nausea, vomiting, or diarrhea. 09/18/2019 upon evaluation today patient appears to be doing much better in regard to his wound in general. The Santyl was doing a good job at loosening stuff up here for [place] which is excellent news. With that being said he is still taking the oral antibiotic. I sent this in last for him on 09/03/2019. This was the doxycycline. When this runs out he will actually be complete as far as the treatment is concerned. With that being said I am getting continue the Cipro for 1 additional month. Overall his wound seems to be doing quite well. 09/25/2019 upon evaluation today patient appears to be doing well with regard to his toe ulcer. This is measuring much better and overall seems to be making good progress. Fortunately there is no signs of active infection at this time. No fevers, chills, nausea, vomiting, or diarrhea. 10/02/2019 upon evaluation today patient appears to be doing well with regard to his wound. He has been tolerating the dressing changes without complication. Fortunately there is no signs of active infection at this time. No fevers, chills, nausea, vomiting, or diarrhea. Overall I am extremely pleased with how things seem to be progressing. I think the wound bed is at the point now where we can likely proceed with additional measures to try to get this healed more quickly I think Dermagraft may be a good possibility for the patient. We can definitely look into seeing about approval for this. 10/09/2019 upon evaluation today patient's wound actually appears to be doing well although it still is progressing somewhat slowly compared to what I would like to see. With that being said I did order Apligraf for the patient which I felt would keep the wound nice and moist without allowing  it to dry out and hopefully allow this to heal much more effectively and quickly. The patient actually did obtain approval and therefore we can apply that today. 10/23/2019 upon evaluation today patient's wound actually appears to be doing significantly better with regard to his toe ulcer. The tissue at the base of the wound is greatly improved and overall I am very pleased with the progress made. We did use Apligraf 2 weeks ago on the toe that did excellent for him much it has completely dissolved after as of the 2 weeks. They only had to change the dressing a few times due to drainage through. Obviously they left the Steri-Strips and Mepitel in place. Unfortunately we  do not have the Apligraf available today for application regard therefore have to use a different product here in the clinic today and then order the Apligraf for next Thursday. That will be his second application. 4/22; patient arrived with overhanging skin and subcutaneous tissue on the wound margins this was removed. Apligraf #2 applied KARLA, VINES ([id number]) 11/13/2019 upon evaluation today patient actually appears to be doing well with regard to his foot ulcer. He has been tolerating the dressing changes without complication. The overall wound bed appears to be healthier and has filled in as well as not nearly as deep as it was the tissue is also better quality. Overall I am extremely pleased with how things seem to be progressing. The patient is likewise very happy in this regard. 5/20; wound is measuring smaller had Apligraf #3 last time. Apligraf #4 today 12/12/2019 upon evaluation today patient actually appears to be doing quite well with regard to his wound in my opinion. The ulcer is significantly smaller compared to last time I saw him although that has been several weeks. I do believe the Apligraf has been beneficial he has 1 remaining today this will be application #5. 10/16/8117 upon evaluation today patient appears  to be doing well with regard to his toe ulcer. He is measuring about the same today but he has come a long way from where this started. Fortunately there is no evidence of active infection which is great news and overall I am very pleased with the progress that is been made. Nonetheless at this point we have completed the Apligraf course he did see improvement through this but I am going to switch to something different as of today. 01/08/2020 upon evaluation today patient appears to be doing much better in regards to his toe ulcer this is measuring smaller and overall very pleased with the progress. The Hydrofera Blue seems to be doing a great job. Fortunately there is no evidence of active infection at this time. No fevers, chills, nausea, vomiting, or diarrhea. 01/29/2020 upon evaluation today patient appears to be doing okay in regard to his toe ulcer. The main issue I see is that he actually has trouble right now with callus growing and covering over the wound bed. This prevents him from being able to get a dressing in contact with the wound bed and subsequently fluid collection underneath. I think that we probably need to see him more frequently to keep this pared down and debrided to the point to allow the dressings to effectively heal this wound. I discussed that with the patient today. 02/05/2020 on evaluation today patient appears to be doing well with regard to his wounds currently. In fact he just has 1 on his toe remaining at this point he seems to be doing quite well in my opinion. I am very pleased with the overall appearance today. There is no signs of active infection I do think keeping the callus trimmed down has been of great benefit as far as healing is concerned. I do not think we need to go 3 weeks out like we were in the past I believe that is been somewhat more detrimental than good for him. 02/19/2020 upon evaluation today patient appears to be doing better in regard to his toe  ulcer still there is callus buildup around the top of the toe which unfortunately is I think causing some issues here with trapping a little fluid underneath them after cleaning this away. Fortunately there is no evidence of  active infection at this time. Overall I feel like the patient is doing quite well which is great news. 03/04/2020 on evaluation today patient appears to be doing well with regard to his toe ulcer though he still has a lot of drying out of the endoform and a lot of callus buildup as well unfortunately. With that being said I think that he is can require some sharp debridement to clear away some of the callus today. We will see what the wound looks like following. 03/11/2020 on evaluation today patient appears to be doing excellent in regard to his toe ulcer. He did have a lot of callus and even some of the nail, growing over top of the area where the wound is trying to heal. For that reason I did actually trim back some of the nail also did go ahead and trim away some of the callus as well. He tolerated all this without complication the wound appears to be doing well and is showing no signs of infection at this point. Overall I am very pleased. 03/18/2020 upon evaluation today patient appears to be doing well with regard to his toe ulcer. There does not appear to be any significant buildup of callus at this point and overall I do believe that the alginate has been beneficial. Trimming back the toenail also seems to have helped a lot in this regard I think that was causing some rubbing on the end of the toe. Fortunately overall he is doing much better unfortunately he notes that his grandson has been placed on hospice he is 54 and apparently not doing too well. He wants to make a trip to go see him I really think that something he probably should do and not wait for this to try to heal. Objective Constitutional Well-nourished and well-hydrated in no acute distress. Vitals Time Taken:  10:20 AM, Height: 76 in, Weight: 244 lbs, BMI: 29.7, Temperature: 97.8 F, Pulse: 80 bpm, Respiratory Rate: 20 breaths/min, Blood Pressure: 201/113 mmHg. Respiratory normal breathing without difficulty. Psychiatric this patient is able to make decisions and demonstrates good insight into disease process. Alert and Oriented x 3. pleasant and cooperative. General Notes: Upon inspection patient's wound bed actually showed signs of fairly good granulation there was no significant slough buildup and no need for sharp debridement today which is good news. I do believe talking a little bit of the alginate into the wound would be beneficial I discussed this with him last time but again I do not think that got back to his caregiver who has been doing the dressing changes. She states she will do that going forward. Integumentary (Hair, Skin) Wound #3 status is Open. Original cause of wound was Gradually Appeared. The wound is located on the Left Toe Great. The wound measures GUALBERTO, WAHLEN. ([id number]) 0.3cm length x 0.2cm width x 0.2cm depth; 0.047cm^2 area and 0.009cm^3 volume. There is Fat Layer (Subcutaneous Tissue) exposed. There is a medium amount of serous drainage noted. The wound margin is thickened. There is no granulation within the wound bed. There is a small (1-33%) amount of necrotic tissue within the wound bed including Adherent Slough. 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 Anesthetic (add to Medication List): Wound #3 Left Toe Great: Topical Lidocaine 4% cream applied to wound bed prior to  debridement (In Clinic Only). Primary Wound Dressing: Wound #3 Left Toe Great: Silver Alginate Secondary Dressing: Wound #3 Left Toe Great: Dry Gauze Conform/Kerlix Dressing Change Frequency: Wound #3 Left Toe Great: Change dressing every other day. Other: - As needed Follow-up Appointments: Wound #3 Left Toe Great: Return Appointment in 1 week. Off-Loading: Wound #3 Left Toe Great: Open toe surgical shoe with peg assist. - left foot 1. I would recommend currently that we continue with the wound care measures as before and we will continue to monitor for any signs of worsening in general. With that being said I Georgina Peer see him back for follow-up visit in 2 weeks he is likely can try to go see his grandson which I think would be ideal. He wants to try to take him hunting before he passes. 2. I am also can recommend at this time that the patient continue to be monitored for any signs of worsening with regard to infection I see nothing like that at this point but again I do believe the need to keep a close eye on this. 3. He will continue to use the surgical shoe with peg assist on the left foot although if he needs to put on a different shoe/boot to take his grandson hunting I did give information for that I will think a couple hours of doing that is good to be a problem. We will see patient back for reevaluation in 2 weeks here in the clinic. If anything worsens or changes patient will contact our office for additional recommendations. Electronic Signature(s) Signed: 03/18/2020 11:27:58 AM By: Worthy Keeler PA-C Entered By: Worthy Keeler on 03/18/2020 11:27:57 MATISSE, SALAIS ([id number]) -------------------------------------------------------------------------------- SuperBill Details Patient Name: Timothy Gibson Date of Service: 03/18/2020 Medical Record Number: [id number] Patient Account Number: [id number] Date of Birth/Sex: 08-Oct-1950 (69 y.o. M) Treating RN: Cornell Barman Primary Care Provider: Merrie Roof Other Clinician: Referring Provider: Merrie Roof Treating Provider/Extender: Melburn Hake, Warda Mcqueary Weeks in Treatment: 35 Diagnosis Coding ICD-10 Codes Code Description 4146124369 Other chronic osteomyelitis, left ankle and foot E11.621 Type 2 diabetes mellitus with foot ulcer L97.522 Non-pressure chronic ulcer of other part of left foot with fat layer exposed I10 Essential (primary) hypertension Facility Procedures CPT4 Code: 09326712 Description: 99213 - WOUND CARE VISIT-LEV 3 EST PT Modifier: Quantity: 1 Physician Procedures CPT4 Code: 4580998 Description: 33825 - WC PHYS LEVEL 3 - EST PT Modifier: Quantity: 1 CPT4 Code: Description: ICD-10 Diagnosis Description M86.672 Other chronic osteomyelitis, left ankle and foot E11.621 Type 2 diabetes mellitus with foot ulcer L97.522 Non-pressure chronic ulcer of other part of left foot with fat layer ex I10 Essential (primary)  hypertension Modifier: posed Quantity: Electronic Signature(s) Signed: 03/18/2020 11:28:18 AM By: Worthy Keeler PA-C Entered By: Worthy Keeler on 03/18/2020 11:28:17

## 2020-03-19 DIAGNOSIS — Z992 Dependence on renal dialysis: Secondary | ICD-10-CM | POA: Diagnosis not present

## 2020-03-19 DIAGNOSIS — D509 Iron deficiency anemia, unspecified: Secondary | ICD-10-CM | POA: Diagnosis not present

## 2020-03-19 DIAGNOSIS — N186 End stage renal disease: Secondary | ICD-10-CM | POA: Diagnosis not present

## 2020-03-19 DIAGNOSIS — D631 Anemia in chronic kidney disease: Secondary | ICD-10-CM | POA: Diagnosis not present

## 2020-03-19 DIAGNOSIS — N2581 Secondary hyperparathyroidism of renal origin: Secondary | ICD-10-CM | POA: Diagnosis not present

## 2020-03-19 NOTE — Progress Notes (Signed)
Timothy Gibson, Timothy Gibson ([id number]) Visit Report for 03/18/2020 Arrival Information Details Patient Name: Timothy Gibson, Timothy Gibson Date of Service: 03/18/2020 10:45 AM Medical Record Number: [id number] Patient Account Number: [id number] Date of Birth/Sex: [birth year] (69 y.o. M) Treating RN: Cornell Barman Primary Care Celia Gibbons: Merrie Roof Other Clinician: Referring Lowen Barringer: Merrie Roof Treating Antoine Fiallos/Extender: Melburn Hake, [name] Weeks in Treatment: 25 Visit Information History Since Last Visit All ordered tests and consults were completed: No Patient Arrived: Cane Added or deleted any medications: No Arrival Time: 10:30 Any new allergies or adverse reactions: No Accompanied By: caregiver Had a fall or experienced change in No Transfer Assistance: None activities of daily living that may affect Patient Identification Verified: Yes risk of falls: Secondary Verification Process Completed: Yes Signs or symptoms of abuse/neglect since last visito No Patient Requires Transmission-Based Precautions: No Hospitalized since last visit: No Patient Has Alerts: No Implantable device outside of the clinic excluding No cellular tissue based products placed in the center since last visit: Has Dressing in Place as Prescribed: Yes Pain Present Now: No Electronic Signature(s) Signed: 03/18/2020 1:36:53 PM By: Darci Needle Entered By: Darci Needle on 03/18/2020 10:31:43 Timothy Gibson ([id number]) -------------------------------------------------------------------------------- Clinic Level of Care Assessment Details Patient Name: Timothy Gibson Date of Service: 03/18/2020 10:45 AM Medical Record Number: [id number] Patient Account Number: [id number] Date of Birth/Sex: 1951/03/15 (69 y.o. M) Treating RN: Grover Canavan Primary Care Teale Goodgame: Merrie Roof Other Clinician: Referring Anndrea Mihelich: Merrie Roof Treating Lafreda Casebeer/Extender: Melburn Hake, [name] Weeks in Treatment: 35 Clinic Level of Care  Assessment Items TOOL 4 Quantity Score []  - Use when only an EandM is performed on FOLLOW-UP visit 0 ASSESSMENTS - Nursing Assessment / Reassessment X - Reassessment of Co-morbidities (includes updates in patient status) 1 10 X- 1 5 Reassessment of Adherence to Treatment Plan ASSESSMENTS - Wound and Skin Assessment / Reassessment X - Simple Wound Assessment / Reassessment - one wound 1 5 []  - 0 Complex Wound Assessment / Reassessment - multiple wounds []  - 0 Dermatologic / Skin Assessment (not related to wound area) ASSESSMENTS - Focused Assessment []  - Circumferential Edema Measurements - multi extremities 0 []  - 0 Nutritional Assessment / Counseling / Intervention X- 1 5 Lower Extremity Assessment (monofilament, tuning fork, pulses) []  - 0 Peripheral Arterial Disease Assessment (using hand held doppler) ASSESSMENTS - Ostomy and/or Continence Assessment and Care []  - Incontinence Assessment and Management 0 []  - 0 Ostomy Care Assessment and Management (repouching, etc.) PROCESS - Coordination of Care X - Simple Patient / Family Education for ongoing care 1 15 []  - 0 Complex (extensive) Patient / Family Education for ongoing care []  - 0 Staff obtains Programmer, systems, Records, Test Results / Process Orders []  - 0 Staff telephones HHA, Nursing Homes / Clarify orders / etc []  - 0 Routine Transfer to another Facility (non-emergent condition) []  - 0 Routine Hospital Admission (non-emergent condition) []  - 0 New Admissions / Biomedical engineer / Ordering NPWT, Apligraf, etc. []  - 0 Emergency Hospital Admission (emergent condition) X- 1 10 Simple Discharge Coordination []  - 0 Complex (extensive) Discharge Coordination PROCESS - Special Needs []  - Pediatric / Minor Patient Management 0 []  - 0 Isolation Patient Management []  - 0 Hearing / Language / Visual special needs []  - 0 Assessment of Community assistance (transportation, D/C planning, etc.) []  - 0 Additional  assistance / Altered mentation []  - 0 Support Surface(s) Assessment (bed, cushion, seat, etc.) INTERVENTIONS - Wound Cleansing / Measurement Timothy Gibson, Timothy Gibson. ([id number]) X- 1 5 Simple Wound Cleansing -  one wound []  - 0 Complex Wound Cleansing - multiple wounds X- 1 5 Wound Imaging (photographs - any number of wounds) []  - 0 Wound Tracing (instead of photographs) X- 1 5 Simple Wound Measurement - one wound []  - 0 Complex Wound Measurement - multiple wounds INTERVENTIONS - Wound Dressings X - Small Wound Dressing one or multiple wounds 1 10 []  - 0 Medium Wound Dressing one or multiple wounds []  - 0 Large Wound Dressing one or multiple wounds []  - 0 Application of Medications - topical []  - 0 Application of Medications - injection INTERVENTIONS - Miscellaneous []  - External ear exam 0 []  - 0 Specimen Collection (cultures, biopsies, blood, body fluids, etc.) []  - 0 Specimen(s) / Culture(s) sent or taken to Lab for analysis []  - 0 Patient Transfer (multiple staff / Civil Service fast streamer / Similar devices) []  - 0 Simple Staple / Suture removal (25 or less) []  - 0 Complex Staple / Suture removal (26 or more) []  - 0 Hypo / Hyperglycemic Management (close monitor of Blood Glucose) []  - 0 Ankle / Brachial Index (ABI) - do not check if billed separately X- 1 5 Vital Signs Has the patient been seen at the hospital within the last three years: Yes Total Score: 80 Level Of Care: New/Established - Level 3 Electronic Signature(s) Signed: 03/18/2020 4:47:46 PM By: Grover Canavan Entered By: Grover Canavan on 03/18/2020 11:01:42 Timothy Gibson ([id number]) -------------------------------------------------------------------------------- Encounter Discharge Information Details Patient Name: Timothy Gibson Date of Service: 03/18/2020 10:45 AM Medical Record Number: [id number] Patient Account Number: [id number] Date of Birth/Sex: 09/29/1950 (69 y.o. M) Treating RN: Grover Canavan Primary Care Dixie Jafri: Merrie Roof Other Clinician: Referring Andrianna Manalang: Merrie Roof Treating Maudie Shingledecker/Extender: Melburn Hake, [name] Weeks in Treatment: 55 Encounter Discharge Information Items Discharge Condition: Stable Ambulatory Status: Cane Discharge Destination: Home Transportation: Other Accompanied By: self Schedule Follow-up Appointment: Yes Clinical Summary of Care: Electronic Signature(s) Signed: 03/18/2020 4:47:46 PM By: Grover Canavan Entered By: Grover Canavan on 03/18/2020 11:03:09 Timothy Gibson ([id number]) -------------------------------------------------------------------------------- Lower Extremity Assessment Details Patient Name: Timothy Gibson Date of Service: 03/18/2020 10:45 AM Medical Record Number: [id number] Patient Account Number: [id number] Date of Birth/Sex: [birth year] (69 y.o. M) Treating RN: Cornell Barman Primary Care Gwenevere Goga: Merrie Roof Other Clinician: Referring Tanajah Boulter: Merrie Roof Treating Jadan Hinojos/Extender: Melburn Hake, [name] Weeks in Treatment: 35 Edema Assessment Assessed: [Left: No] [Right: No] [Left: Edema] [Right: :] Calf Left: Right: Point of Measurement: 36 cm From Medial Instep cm 40 cm Ankle Left: Right: Point of Measurement: 12 cm From Medial Instep cm 27 cm Vascular Assessment Pulses: Dorsalis Pedis Palpable: [Right:Yes] Posterior Tibial Palpable: [Right:Yes] Electronic Signature(s) Signed: 03/18/2020 1:36:53 PM By: Darci Needle Signed: 03/18/2020 5:20:33 PM By: Gretta Cool, BSN, RN, CWS, Kim RN, BSN Entered By: Darci Needle on 03/18/2020 10:37:04 Timothy Gibson ([id number]) -------------------------------------------------------------------------------- Multi Wound Chart Details Patient Name: Timothy Gibson Date of Service: 03/18/2020 10:45 AM Medical Record Number: [id number] Patient Account Number: [id number] Date of Birth/Sex: 06/06/1951 (69 y.o. M) Treating RN: Grover Canavan Primary Care Laurie Lovejoy:  Merrie Roof Other Clinician: Referring Laneah Luft: Merrie Roof Treating Jacquiline Zurcher/Extender: Melburn Hake, [name] Weeks in Treatment: 35 Vital Signs Height(in): 76 Pulse(bpm): 80 Weight(lbs): 244 Blood Pressure(mmHg): 201/113 Body Mass Index(BMI): 30 Temperature(F): 97.8 Respiratory Rate(breaths/min): 20 Photos: [N/A:N/A] Wound Location: Left Toe Great N/A N/A Wounding Event: Gradually Appeared N/A N/A Primary Etiology: Diabetic Wound/Ulcer of the Lower N/A N/A Extremity Comorbid History: Lymphedema, Arrhythmia, N/A N/A Hypertension, Type II Diabetes, End Stage Renal Disease, Rheumatoid Arthritis, Osteoarthritis,  Confinement Anxiety Date Acquired: 06/19/2019 N/A N/A Weeks of Treatment: 35 N/A N/A Wound Status: Open N/A N/A Measurements L x W x D (cm) 0.3x0.2x0.2 N/A N/A Area (cm) : 0.047 N/A N/A Volume (cm) : 0.009 N/A N/A % Reduction in Area: 98.30% N/A N/A % Reduction in Volume: 96.70% N/A N/A Classification: Grade 2 N/A N/A Exudate Amount: Medium N/A N/A Exudate Type: Serous N/A N/A Exudate Color: amber N/A N/A Wound Margin: Thickened N/A N/A Granulation Amount: None Present (0%) N/A N/A Necrotic Amount: Small (1-33%) N/A N/A Exposed Structures: Fat Layer (Subcutaneous Tissue): N/A N/A Yes Fascia: No Tendon: No Muscle: No Joint: No Bone: No Epithelialization: Medium (34-66%) N/A N/A Treatment Notes Electronic Signature(s) Signed: 03/18/2020 4:47:46 PM By: Grover Canavan Entered By: Grover Canavan on 03/18/2020 10:53:14 Timothy Gibson ([id number]) -------------------------------------------------------------------------------- [place] Details Patient Name: Timothy Gibson Date of Service: 03/18/2020 10:45 AM Medical Record Number: [id number] Patient Account Number: [id number] Date of Birth/Sex: 12/24/1950 (69 y.o. M) Treating RN: Grover Canavan Primary Care Shanaiya Bene: Merrie Roof Other Clinician: Referring Yoanna Jurczyk: Merrie Roof Treating Dejia Ebron/Extender: Melburn Hake, [name] Weeks in Treatment: 40 Active Inactive Abuse / Safety / Falls / Self Care Management Nursing Diagnoses: Potential for falls Goals: Patient will not experience any injury related to falls Date Initiated: 07/15/2019 Target Resolution Date: 10/18/2019 Goal Status: Active Interventions: Assess fall risk on admission and as needed Notes: Necrotic Tissue Nursing Diagnoses: Impaired tissue integrity related to necrotic/devitalized tissue Goals: Necrotic/devitalized tissue will be minimized in the wound bed Date Initiated: 07/15/2019 Target Resolution Date: 10/18/2019 Goal Status: Active Interventions: Provide education on necrotic tissue and debridement process Notes: Orientation to the Wound Care Program Nursing Diagnoses: Knowledge deficit related to the wound healing center program Goals: Patient/caregiver will verbalize understanding of the [facility] Date Initiated: 07/15/2019 Target Resolution Date: 10/18/2019 Goal Status: Active Interventions: Provide education on orientation to the wound center Notes: Peripheral Neuropathy Nursing Diagnoses: Knowledge deficit related to disease process and management of peripheral neurovascular dysfunction Goals: Patient/caregiver will verbalize understanding of disease process and disease management Date Initiated: 07/15/2019 Target Resolution Date: 10/18/2019 Goal Status: Active Timothy Gibson, Timothy Gibson ([id number]) Interventions: Assess signs and symptoms of neuropathy upon admission and as needed Provide education on Management of Neuropathy and Related Ulcers Notes: Wound/Skin Impairment Nursing Diagnoses: Impaired tissue integrity Goals: Ulcer/skin breakdown will heal within 14 weeks Date Initiated: 07/15/2019 Target Resolution Date: 10/18/2019 Goal Status: Active Interventions: Assess patient/caregiver ability to obtain necessary supplies Assess patient/caregiver  ability to perform ulcer/skin care regimen upon admission and as needed Assess ulceration(s) every visit Notes: Electronic Signature(s) Signed: 03/18/2020 4:47:46 PM By: Grover Canavan Entered By: Grover Canavan on 03/18/2020 10:52:18 Timothy Gibson, Timothy Gibson ([id number]) -------------------------------------------------------------------------------- Pain Assessment Details Patient Name: Timothy Gibson Date of Service: 03/18/2020 10:45 AM Medical Record Number: [id number] Patient Account Number: [id number] Date of Birth/Sex: 06/27/1951 (69 y.o. M) Treating RN: Cornell Barman Primary Care Katilynn Sinkler: Merrie Roof Other Clinician: Referring Blythe Veach: Merrie Roof Treating Liberty Stead/Extender: Melburn Hake, [name] Weeks in Treatment: 35 Active Problems Location of Pain Severity and Description of Pain Patient Has Paino No Site Locations With Dressing Change: No Pain Management and Medication Current Pain Management: Electronic Signature(s) Signed: 03/18/2020 1:36:53 PM By: Darci Needle Signed: 03/18/2020 5:20:33 PM By: Gretta Cool, BSN, RN, CWS, Kim RN, BSN Entered By: Darci Needle on 03/18/2020 10:33:00 Timothy Gibson ([id number]) -------------------------------------------------------------------------------- Patient/Caregiver Education Details Patient Name: Timothy Gibson Date of Service: 03/18/2020 10:45 AM Medical Record Number: [id number] Patient Account Number: [id number]  Date of Birth/Gender: [birth year] (69 y.o. M) Treating RN: Grover Canavan Primary Care Physician: Merrie Roof Other Clinician: Referring Physician: Merrie Roof Treating Physician/Extender: Sharalyn Ink in Treatment: 76 Education Assessment Education Provided To: Patient Education Topics Provided Wound/Skin Impairment: Handouts: Caring for Your Ulcer Methods: Explain/Verbal Responses: State content correctly Electronic Signature(s) Signed: 03/18/2020 4:47:46 PM By: Grover Canavan Entered By: Grover Canavan on 03/18/2020 11:02:11 Timothy Gibson, Timothy Gibson ([id number]) -------------------------------------------------------------------------------- Wound Assessment Details Patient Name: Timothy Gibson Date of Service: 03/18/2020 10:45 AM Medical Record Number: [id number] Patient Account Number: [id number] Date of Birth/Sex: 15-Jan-1951 (69 y.o. M) Treating RN: Cornell Barman Primary Care Aubrey Blackard: Merrie Roof Other Clinician: Referring Janasha Barkalow: Merrie Roof Treating Kristi Hyer/Extender: Melburn Hake, [name] Weeks in Treatment: 35 Wound Status Wound Number: 3 Primary Diabetic Wound/Ulcer of the Lower Extremity Etiology: Wound Location: Left Toe Great Wound Open Wounding Event: Gradually Appeared Status: Date Acquired: 06/19/2019 Comorbid Lymphedema, Arrhythmia, Hypertension, Type II Weeks Of Treatment: 35 History: Diabetes, End Stage Renal Disease, Rheumatoid Arthritis, Clustered Wound: No Osteoarthritis, Confinement Anxiety Photos Wound Measurements Length: (cm) 0.3 Width: (cm) 0.2 Depth: (cm) 0.2 Area: (cm) 0.04 Volume: (cm) 0.00 % Reduction in Area: 98.3% % Reduction in Volume: 96.7% Epithelialization: Medium (34-66%) 7 9 Wound Description Classification: Grade 2 Foul Wound Margin: Thickened Slou Exudate Amount: Medium Exudate Type: Serous Exudate Color: amber Odor After Cleansing: No gh/Fibrino Yes Wound Bed Granulation Amount: None Present (0%) Exposed Structure Necrotic Amount: Small (1-33%) Fascia Exposed: No Necrotic Quality: Adherent Slough Fat Layer (Subcutaneous Tissue) Exposed: Yes Tendon Exposed: No Muscle Exposed: No Joint Exposed: No Bone Exposed: No Treatment Notes Wound #3 (Left Toe Great) Notes Silver alginate, gauze, conform, offloading shoe Electronic Signature(s) Timothy Gibson, Timothy Gibson ([id number]) Signed: 03/18/2020 1:36:53 PM By: Darci Needle Signed: 03/18/2020 5:20:33 PM By: Gretta Cool, BSN, RN, CWS, Kim RN, BSN Entered By: Darci Needle on  03/18/2020 10:34:50 Timothy Gibson ([id number]) -------------------------------------------------------------------------------- [place] Details Patient Name: Timothy Gibson Date of Service: 03/18/2020 10:45 AM Medical Record Number: [id number] Patient Account Number: [id number] Date of Birth/Sex: 1950/07/24 (69 y.o. M) Treating RN: Cornell Barman Primary Care Gracemarie Skeet: Merrie Roof Other Clinician: Referring Misty Rago: Merrie Roof Treating Fatim Vanderschaaf/Extender: Melburn Hake, [name] Weeks in Treatment: 35 Vital Signs Time Taken: 10:20 Temperature (F): 97.8 Height (in): 76 Pulse (bpm): 80 Weight (lbs): 244 Respiratory Rate (breaths/min): 20 Body Mass Index (BMI): 29.7 Blood Pressure (mmHg): 201/113 Reference Range: 80 - 120 mg / dl Electronic Signature(s) Signed: 03/18/2020 1:36:53 PM By: Darci Needle Entered By: Darci Needle on 03/18/2020 10:32:38

## 2020-03-20 DIAGNOSIS — D509 Iron deficiency anemia, unspecified: Secondary | ICD-10-CM | POA: Diagnosis not present

## 2020-03-20 DIAGNOSIS — D631 Anemia in chronic kidney disease: Secondary | ICD-10-CM | POA: Diagnosis not present

## 2020-03-20 DIAGNOSIS — Z992 Dependence on renal dialysis: Secondary | ICD-10-CM | POA: Diagnosis not present

## 2020-03-20 DIAGNOSIS — Z03818 Encounter for observation for suspected exposure to other biological agents ruled out: Secondary | ICD-10-CM | POA: Diagnosis not present

## 2020-03-20 DIAGNOSIS — N186 End stage renal disease: Secondary | ICD-10-CM | POA: Diagnosis not present

## 2020-03-20 DIAGNOSIS — N2581 Secondary hyperparathyroidism of renal origin: Secondary | ICD-10-CM | POA: Diagnosis not present

## 2020-03-23 DIAGNOSIS — Z992 Dependence on renal dialysis: Secondary | ICD-10-CM | POA: Diagnosis not present

## 2020-03-23 DIAGNOSIS — N2581 Secondary hyperparathyroidism of renal origin: Secondary | ICD-10-CM | POA: Diagnosis not present

## 2020-03-23 DIAGNOSIS — N186 End stage renal disease: Secondary | ICD-10-CM | POA: Diagnosis not present

## 2020-03-23 DIAGNOSIS — D631 Anemia in chronic kidney disease: Secondary | ICD-10-CM | POA: Diagnosis not present

## 2020-03-26 DIAGNOSIS — N186 End stage renal disease: Secondary | ICD-10-CM | POA: Diagnosis not present

## 2020-03-26 DIAGNOSIS — N2581 Secondary hyperparathyroidism of renal origin: Secondary | ICD-10-CM | POA: Diagnosis not present

## 2020-03-26 DIAGNOSIS — D631 Anemia in chronic kidney disease: Secondary | ICD-10-CM | POA: Diagnosis not present

## 2020-03-26 DIAGNOSIS — Z992 Dependence on renal dialysis: Secondary | ICD-10-CM | POA: Diagnosis not present

## 2020-03-26 DIAGNOSIS — D509 Iron deficiency anemia, unspecified: Secondary | ICD-10-CM | POA: Diagnosis not present

## 2020-03-27 DIAGNOSIS — D631 Anemia in chronic kidney disease: Secondary | ICD-10-CM | POA: Diagnosis not present

## 2020-03-27 DIAGNOSIS — N186 End stage renal disease: Secondary | ICD-10-CM | POA: Diagnosis not present

## 2020-03-27 DIAGNOSIS — Z992 Dependence on renal dialysis: Secondary | ICD-10-CM | POA: Diagnosis not present

## 2020-03-27 DIAGNOSIS — D509 Iron deficiency anemia, unspecified: Secondary | ICD-10-CM | POA: Diagnosis not present

## 2020-03-27 DIAGNOSIS — N2581 Secondary hyperparathyroidism of renal origin: Secondary | ICD-10-CM | POA: Diagnosis not present

## 2020-03-29 DIAGNOSIS — N186 End stage renal disease: Secondary | ICD-10-CM | POA: Diagnosis not present

## 2020-03-29 DIAGNOSIS — D509 Iron deficiency anemia, unspecified: Secondary | ICD-10-CM | POA: Diagnosis not present

## 2020-03-29 DIAGNOSIS — D631 Anemia in chronic kidney disease: Secondary | ICD-10-CM | POA: Diagnosis not present

## 2020-03-29 DIAGNOSIS — N2581 Secondary hyperparathyroidism of renal origin: Secondary | ICD-10-CM | POA: Diagnosis not present

## 2020-03-29 DIAGNOSIS — Z992 Dependence on renal dialysis: Secondary | ICD-10-CM | POA: Diagnosis not present

## 2020-03-30 DIAGNOSIS — I5032 Chronic diastolic (congestive) heart failure: Secondary | ICD-10-CM | POA: Diagnosis not present

## 2020-03-30 DIAGNOSIS — E11621 Type 2 diabetes mellitus with foot ulcer: Secondary | ICD-10-CM | POA: Diagnosis not present

## 2020-03-30 DIAGNOSIS — I739 Peripheral vascular disease, unspecified: Secondary | ICD-10-CM | POA: Diagnosis not present

## 2020-03-30 DIAGNOSIS — J449 Chronic obstructive pulmonary disease, unspecified: Secondary | ICD-10-CM | POA: Diagnosis not present

## 2020-03-30 DIAGNOSIS — E1165 Type 2 diabetes mellitus with hyperglycemia: Secondary | ICD-10-CM | POA: Diagnosis not present

## 2020-03-30 DIAGNOSIS — I159 Secondary hypertension, unspecified: Secondary | ICD-10-CM | POA: Diagnosis not present

## 2020-03-30 DIAGNOSIS — Z6841 Body Mass Index (BMI) 40.0 and over, adult: Secondary | ICD-10-CM | POA: Diagnosis not present

## 2020-03-30 DIAGNOSIS — N186 End stage renal disease: Secondary | ICD-10-CM | POA: Diagnosis not present

## 2020-03-30 DIAGNOSIS — Z992 Dependence on renal dialysis: Secondary | ICD-10-CM | POA: Diagnosis not present

## 2020-03-30 DIAGNOSIS — L97524 Non-pressure chronic ulcer of other part of left foot with necrosis of bone: Secondary | ICD-10-CM | POA: Diagnosis not present

## 2020-03-31 DIAGNOSIS — Z992 Dependence on renal dialysis: Secondary | ICD-10-CM | POA: Diagnosis not present

## 2020-03-31 DIAGNOSIS — D631 Anemia in chronic kidney disease: Secondary | ICD-10-CM | POA: Diagnosis not present

## 2020-03-31 DIAGNOSIS — N2581 Secondary hyperparathyroidism of renal origin: Secondary | ICD-10-CM | POA: Diagnosis not present

## 2020-03-31 DIAGNOSIS — D509 Iron deficiency anemia, unspecified: Secondary | ICD-10-CM | POA: Diagnosis not present

## 2020-03-31 DIAGNOSIS — N186 End stage renal disease: Secondary | ICD-10-CM | POA: Diagnosis not present

## 2020-04-01 ENCOUNTER — Encounter: Payer: Medicare Other | Admitting: Physician Assistant

## 2020-04-01 ENCOUNTER — Other Ambulatory Visit: Payer: Self-pay

## 2020-04-01 DIAGNOSIS — F17290 Nicotine dependence, other tobacco product, uncomplicated: Secondary | ICD-10-CM | POA: Diagnosis not present

## 2020-04-01 DIAGNOSIS — E1169 Type 2 diabetes mellitus with other specified complication: Secondary | ICD-10-CM | POA: Diagnosis not present

## 2020-04-01 DIAGNOSIS — I10 Essential (primary) hypertension: Secondary | ICD-10-CM | POA: Diagnosis not present

## 2020-04-01 DIAGNOSIS — L97522 Non-pressure chronic ulcer of other part of left foot with fat layer exposed: Secondary | ICD-10-CM | POA: Diagnosis not present

## 2020-04-01 DIAGNOSIS — E11621 Type 2 diabetes mellitus with foot ulcer: Secondary | ICD-10-CM | POA: Diagnosis not present

## 2020-04-01 DIAGNOSIS — M86672 Other chronic osteomyelitis, left ankle and foot: Secondary | ICD-10-CM | POA: Diagnosis not present

## 2020-04-02 DIAGNOSIS — Z992 Dependence on renal dialysis: Secondary | ICD-10-CM | POA: Diagnosis not present

## 2020-04-02 DIAGNOSIS — D631 Anemia in chronic kidney disease: Secondary | ICD-10-CM | POA: Diagnosis not present

## 2020-04-02 DIAGNOSIS — N186 End stage renal disease: Secondary | ICD-10-CM | POA: Diagnosis not present

## 2020-04-02 DIAGNOSIS — D509 Iron deficiency anemia, unspecified: Secondary | ICD-10-CM | POA: Diagnosis not present

## 2020-04-02 DIAGNOSIS — N2581 Secondary hyperparathyroidism of renal origin: Secondary | ICD-10-CM | POA: Diagnosis not present

## 2020-04-02 NOTE — Progress Notes (Addendum)
Timothy Gibson, Timothy Gibson ([id number]) Visit Report for 04/01/2020 Chief Complaint Document Details Patient Name: Timothy Gibson, Timothy Gibson Date of Service: 04/01/2020 8:15 AM Medical Record Number: [id number] Patient Account Number: [id number] Date of Birth/Sex: Dec 19, 1950 (69 y.o. M) Treating RN: Cornell Barman Primary Care Provider: Merrie Roof Other Clinician: Referring Provider: Merrie Roof Treating Provider/Extender: Melburn Hake, Rachelann Enloe Weeks in Treatment: 48 Information Obtained from: Patient Chief Complaint Left Great Toe Electronic Signature(s) Signed: 04/01/2020 9:26:48 AM By: Worthy Keeler PA-C Entered By: Worthy Keeler on 04/01/2020 09:26:47 Timothy Gibson ([id number]) -------------------------------------------------------------------------------- Debridement Details Patient Name: Timothy Gibson Date of Service: 04/01/2020 8:15 AM Medical Record Number: [id number] Patient Account Number: [id number] Date of Birth/Sex: 1950-11-10 (69 y.o. M) Treating RN: Grover Canavan Primary Care Provider: Merrie Roof Other Clinician: Referring Provider: Merrie Roof Treating Provider/Extender: Melburn Hake, Zamantha Strebel Weeks in Treatment: 37 Debridement Performed for Wound #3 Left Toe Great Assessment: Performed By: Physician [name], Hafsah Hendler E., PA-C Debridement Type: Debridement Severity of Tissue Pre Debridement: Fat layer exposed Level of Consciousness (Pre- Awake and Alert procedure): Pre-procedure Verification/Time Out Yes - 08:45 Taken: Start Time: 08:46 Pain Control: Lidocaine Total Area Debrided (L x W): 0.3 (cm) x 0.1 (cm) = 0.03 (cm) Tissue and other material Viable, Non-Viable, Callus, Slough, Subcutaneous, Slough debrided: Level: Skin/Subcutaneous Tissue Debridement Description: Excisional Instrument: Curette Bleeding: Minimum Hemostasis Achieved: Pressure End Time: 08:50 Procedural Pain: 0 Post Procedural Pain: 0 Response to Treatment: Procedure was tolerated well Level of  Consciousness (Post- Awake and Alert procedure): Post Debridement Measurements of Total Wound Length: (cm) 0.3 Width: (cm) 0.1 Depth: (cm) 0.3 Volume: (cm) 0.007 Character of Wound/Ulcer Post Debridement: Improved Severity of Tissue Post Debridement: Fat layer exposed Post Procedure Diagnosis Same as Pre-procedure Electronic Signature(s) Signed: 04/01/2020 5:09:50 PM By: Grover Canavan Signed: 04/01/2020 5:29:43 PM By: Worthy Keeler PA-C Entered By: Grover Canavan on 04/01/2020 08:49:57 Timothy Gibson, Timothy Gibson ([id number]) -------------------------------------------------------------------------------- HPI Details Patient Name: Timothy Gibson Date of Service: 04/01/2020 8:15 AM Medical Record Number: [id number] Patient Account Number: [id number] Date of Birth/Sex: Feb 07, 1951 (69 y.o. M) Treating RN: Cornell Barman Primary Care Provider: Merrie Roof Other Clinician: Referring Provider: Merrie Roof Treating Provider/Extender: Melburn Hake, Shakeira Rhee Weeks in Treatment: 37 History of Present Illness HPI Description: 11/27/17 on evaluation today patient presents for initial evaluation concerning an issue that he has been having with his right great toe which began last Thursday 11/22/17. He has a history of diabetes mellitus type to which he has had for greater than 30 years currently he is on insulin. Subsequently he also has a history of hypertension. On physical exam inspection is also appears he may have some peripheral vascular disease with his ABI's being noncompressible bilaterally. He is on dialysis as well and this is on Monday, Wednesday, and Friday. Patient was hospitalized back in January/February 2019 although this was more for stomach/back pain though they never told me exactly what was going on. This is according to the patient. Subsequently the ulcer which is between the third and fourth toe when space of the right foot as well is on the plantar aspect of the fourth toe is due to  him having cleaned his toes this morning and he states that his finger which is large split the toe tissue in between causing the wounds that we currently see. With that being said he states this has happened before I explained I would definitely recommend that he not do this any longer. He can use a small soft washcloth to  clean between the toes without causing trauma. Subsequently the right great toe hatchery does show evidence of necrotic tissue present on the surface of the wound specifically there is callous and dead skin surrounding which is trapping fluid causing problems as far as the wound is concerned. The surface of the wound does show slough although due to his vascular flow I'm not going to sharply debride this today I think I will selectively debride away the necrotic skin as well is callous from around the surrounding so this will not continue to be a moisture issue. Nonetheless the patient has no pain he does have diabetic neuropathy. 12/06/17-He is here in follow-up evaluation for a right great toe ulcer. He is voicing no complaints or concerns. He continues to infrequently/socially smokes cigars with no desire to quit. He has been compliant with offloading, using open toed surgical shoe; has been compliant using Santyl daily. He continues on clindamycin although takes it inconsistently secondary to indigestion. The plain film x-ray performed on 5/23 impression: Soft tissue swelling of the left first toe and is noted with probable irregular lucency involving distal tuft of first distal phalanx concerning for osteomyelitis. MRI may be performed for further evaluation; MRI ordered. The vascular evaluation performed on 5/24 field non- compressible ABIs bilaterally and reduced TBI bilaterally suggesting significant tibial disease. He will be referred to vascular medicine for further evaluation. 12/13/17-He is here in follow-up evaluation for right great toe ulcer. He has appointment next  Thursday for the MRI and vascular evaluation. Wound culture obtained last week grew abundant Klebsiella oxytoca (multidrug sensitivity), and abundant enteric coccus faecalis (sensitive to ampicillin). Ampicillin and Cipro were called in, along with a probiotic. In light of his appointments next Thursday he will follow-up in 2 weeks, we will continue with Santyl 12/27/17-He is here for evaluation for a right great toe ulcer. 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 He did have evaluation by vascular medicine, Dr. Ronalee Belts, on 6/13, at that appointment it was decided for him to undergo angiography with possible intervention but he refused and is still considering. If he chooses to have it done he will contact the office. He has not heard back from either ID offices that he was referred to two weeks ago: [place] and [place]. there is improvement in both appearance and measurement to the ulcer. We will extend antibiotic therapy (amoxicillin 500 every 12, Cipro 500 daily) for additional 2 weeks pending infectious disease consult, he will continue with Santyl daily. He was reminded to continue with probiotic therapy while on oral antibiotics; he denies any GI disturbance. 01/03/18-He is here in follow-up evaluation for right great toe ulcer. He is decided to not undergo any vascular intervention at this time. He was established with [facility] infectious disease (Dr. Linus Salmons) last week initiated on vancomycin and cefazolin with dialysis treatment for 6 weeks. We will continue with current treatment plan with Santyl and offloading and he will follow-up in 2 weeks 01/17/18-He is seen in follow-up evaluation for right great toe ulcer. There is improvement in appearance with less nonviable tissue present. We will continue with same treatment plan he will follow-up next week. He is tolerating IV antibiotics without any  complications 6/59/93-TT is seen in follow up evaluation for right great toe ulcer. There continues to be improvement. He is tolerating IV antibiotics with hemodialysis, completion date of 7/31. We will transition to collagen and and follow-up next week 02/07/18-He  is here in follow up for a right great toe ulcer. There is red granulation tissue throughout the wound, improved in appearance. He completed antibiotics yesterday. He saw podiatry last Thursday for nail trimming, at that appointment he periwound callus was trimmed and a culture was obtained; culture negative. He does not have a follow up appointment with infectious disease. We will submit for grafix. 02/14/18-He is here in follow-up evaluation for a right great toe ulcer. There is slow improvement, red granulation tissue throughout. The insurance approval for grafix is pending. We will continue with collagen and he'll follow-up next week 02/21/18-He is seen in follow-up evaluation for right great toe ulcer. There is improvement with red granulation tissue throughout. He was approved for oasis and this was placed today. He will follow up next week 02/28/18-He is seen in follow-up evaluation for right great toe ulcer. The wound is stable, oasis applied and he'll follow-up next week 03/07/18-He is seen in follow-up violation of her right great toe ulcer. The wound is dry, no evidence of drainage. The wound appears healed today. He was painted with Betadine, instructed to paint with Betadine daily, cover with band-aid for the next week and then cover with band-aid for the following week continue with surgical shoe. He was encouraged to contact the clinic with any evidence of drainage, or change in appearance to toe/wound. He will be discharged from wound care services Readmission: 07/14/18 on evaluation today patient presents for initial evaluation our clinic concerning issues that he is having with his left great toe. We previously saw him in 2019  for his right great toe which was actually worse at that time and subsequently was able to be completely healed in the end although it did take several months. With that being said the patient is currently having an area on his left great toe that occurred initially as result of a blister that came up he's not really sure why or how. Subsequently this was initially treated by his podiatrist though the recommendation there was apparently amputation according to the patient and his daughter who was present with him today. With that being said the patient did not want to proceed with amputation and would like to try to perform wound care and get this to heal without having to go down that road. We were able to do that with his other toe and so he would like to at least give that a try before going forward with amputation. Again I completely understand his concern in this regard. With that being said I did discuss with the patient obviously that it's always a chance that we cannot get this to heal with normal wound care measures but obviously we will give it our best shot. He does actually have an appointment scheduled for later today with the vascular specialist in order to evaluate his blood flow. That was at the recommendation of his podiatrist as well. Obviously if he is having good arterial flow and nonetheless is still experiencing issues with having the wound delay in healing then it may simply be a matter of we need to initiate more aggressive wound to therapy in order to see if we can get this to heal. No fevers, chills, Timothy Gibson, Timothy Gibson. ([id number]) nausea, or vomiting noted at this time. The patient notes that this woman has been present for approximately three weeks. He is not a smoker. His most recent hemoglobin A1c was 6.2 on 06/18/19. 07/17/2019 on evaluation today patient appears unfortunately to not be  doing as well. He did see vascular I did review the note as well. That was on 07/15/2019.  Subsequently there can actually be taking him to the OR for an angiogram due to poor arterial flow into this extremity. Apparently the atherosclerotic changes were severe. The patient is stated to be at risk for limb loss. Nonetheless the good news is this is being scheduled fairly quickly he will have the surgery/procedure next Thursday. With that being said I am going also go ahead based on the results of his x-ray which showed some chance of osteomyelitis in the distal portion of the toe get the patient set up for an MRI to further evaluate for the possibility of osteomyelitis obviously if he does have osteomyelitis we need to know so that we can address this appropriately. 07/29/2019 upon evaluation today patient appears to be doing better in general with regard to his toe ulcer. He does have much better blood flow you can actually feel a palpable pulse which appears to be very strong at this time. I did review his arterial intervention/angiogram which did show that he had evidence of sufficient arterial flow restoration at this point. He did have occlusions that were 60 and 80% that residually were only 10 and 25% with no limiting flow. This is excellent news and hopefully he will continue to show signs of improvement in light of the improved vascular status. With that being said the toe mainly shows somewhat of an eschar today I think that this is fairly stable and as such probably would be best not to really do much in the way of debridement at this point I do believe Betadine would likely be a good option. 08/07/2019 on evaluation today patient actually appears to be doing about the same. The one difference is the eschar that we were just seeing if we can maintain is actually starting to loosen up and leak around the edges I think it is time to actually go ahead and remove this today. Is also no longer stable is very soft. Nonetheless the patient is okay with proceeding with debridement at this  point. Fortunately there is no signs of active infection. No fevers, chills, nausea, vomiting, or diarrhea. The patient states that he took his last antibiotic that he had last night. He would need a refill as he will not be seeing infectious disease until February 2. Nonetheless obviously I do not want him to have any break in therapy especially when he is doing as well as he is currently. 08/14/2019 on evaluation today patient appears to be doing quite well all things considered in regard to his toe. I do feel like he is making progress and though this is not completely healed by any means we still have some ways to go I feel like he is making progress which is excellent. There is no sign of active infection at this time systemically. He does have osteomyelitis of the distal phalanx of his great toe on the left. He did see Dr. Linus Salmons. That is the infectious disease doctor in [place] where he was referred. Dr. Linus Salmons apparently according to the note did not feel like the patient's MRI revealed osteomyelitis but just reactive marrow and subsequently did not recommend any antibiotic therapy whatsoever. To be peripherally honest I am not really in agreement with this based on the MRI results and what we are seeing and I think he has been responding at least decently well to oral medication as well. Nonetheless we may  want to check into the possibility of IV antibiotics being administered at the dialysis center. I will discuss this with Dr. Dellia Nims further. 08/21/2019 upon evaluation today patient appears at this point to be making some progress with regard to his wounds. He has been tolerating the dressing changes without complication. Fortunately there is no evidence of active infection at this time. Overall I feel like he is doing well with the oral antibiotics I discussed with Dr. Dellia Nims the possibility of doing IV antibiotic therapy versus continuing with the oral. His opinion was if the patient is  doing well with oral to maybe stick with that. Obviously we can initiate additional Cipro as well just to help ensure that there is nothing worsening here. The Cipro should be beneficial for him as well. That helps cover more gram-negative's were is at the doxycycline is more gram- positive's. 09/02/2019 on evaluation today patient appears to be doing fairly well upon inspection today. With that being said though the patient is continuing to do well he does also continue to develop issues with buildup of necrotic tissue on the distal portion of his toe he does have good arterial flow however. For that reason I am going to suggest that what we may need to do is consider using something such as Santyl to try to keep this free and clear. Also discussed in greater detail HBO therapy with him today please see plan for additional recommendations and results of this discussion. 09/11/2019 upon evaluation today patient appears to be doing well with regard to his toe ulcer. This actually appears to be much better today using the Santyl than it was previous. Fortunately there is no signs of active infection. No fevers, chills, nausea, vomiting, or diarrhea. 09/18/2019 upon evaluation today patient appears to be doing much better in regard to his wound in general. The Santyl was doing a good job at loosening stuff up here for [place] which is excellent news. With that being said he is still taking the oral antibiotic. I sent this in last for him on 09/03/2019. This was the doxycycline. When this runs out he will actually be complete as far as the treatment is concerned. With that being said I am getting continue the Cipro for 1 additional month. Overall his wound seems to be doing quite well. 09/25/2019 upon evaluation today patient appears to be doing well with regard to his toe ulcer. This is measuring much better and overall seems to be making good progress. Fortunately there is no signs of active infection at this  time. No fevers, chills, nausea, vomiting, or diarrhea. 10/02/2019 upon evaluation today patient appears to be doing well with regard to his wound. He has been tolerating the dressing changes without complication. Fortunately there is no signs of active infection at this time. No fevers, chills, nausea, vomiting, or diarrhea. Overall I am extremely pleased with how things seem to be progressing. I think the wound bed is at the point now where we can likely proceed with additional measures to try to get this healed more quickly I think Dermagraft may be a good possibility for the patient. We can definitely look into seeing about approval for this. 10/09/2019 upon evaluation today patient's wound actually appears to be doing well although it still is progressing somewhat slowly compared to what I would like to see. With that being said I did order Apligraf for the patient which I felt would keep the wound nice and moist without allowing it to dry out  and hopefully allow this to heal much more effectively and quickly. The patient actually did obtain approval and therefore we can apply that today. 10/23/2019 upon evaluation today patient's wound actually appears to be doing significantly better with regard to his toe ulcer. The tissue at the base of the wound is greatly improved and overall I am very pleased with the progress made. We did use Apligraf 2 weeks ago on the toe that did excellent for him much it has completely dissolved after as of the 2 weeks. They only had to change the dressing a few times due to drainage through. Obviously they left the Steri-Strips and Mepitel in place. Unfortunately we do not have the Apligraf available today for application regard therefore have to use a different product here in the clinic today and then order the Apligraf for next Thursday. That will be his second application. 4/22; patient arrived with overhanging skin and subcutaneous tissue on the wound margins this  was removed. Apligraf #2 applied 11/13/2019 upon evaluation today patient actually appears to be doing well with regard to his foot ulcer. He has been tolerating the dressing changes without complication. The overall wound bed appears to be healthier and has filled in as well as not nearly as deep as it was the tissue is also better quality. Overall I am extremely pleased with how things seem to be progressing. The patient is likewise very happy in this regard. 5/20; wound is measuring smaller had Apligraf #3 last time. Apligraf #4 today Timothy Gibson, Timothy Gibson ([id number]) 12/12/2019 upon evaluation today patient actually appears to be doing quite well with regard to his wound in my opinion. The ulcer is significantly smaller compared to last time I saw him although that has been several weeks. I do believe the Apligraf has been beneficial he has 1 remaining today this will be application #5. 0/30/0923 upon evaluation today patient appears to be doing well with regard to his toe ulcer. He is measuring about the same today but he has come a long way from where this started. Fortunately there is no evidence of active infection which is great news and overall I am very pleased with the progress that is been made. Nonetheless at this point we have completed the Apligraf course he did see improvement through this but I am going to switch to something different as of today. 01/08/2020 upon evaluation today patient appears to be doing much better in regards to his toe ulcer this is measuring smaller and overall very pleased with the progress. The Hydrofera Blue seems to be doing a great job. Fortunately there is no evidence of active infection at this time. No fevers, chills, nausea, vomiting, or diarrhea. 01/29/2020 upon evaluation today patient appears to be doing okay in regard to his toe ulcer. The main issue I see is that he actually has trouble right now with callus growing and covering over the wound bed. This  prevents him from being able to get a dressing in contact with the wound bed and subsequently fluid collection underneath. I think that we probably need to see him more frequently to keep this pared down and debrided to the point to allow the dressings to effectively heal this wound. I discussed that with the patient today. 02/05/2020 on evaluation today patient appears to be doing well with regard to his wounds currently. In fact he just has 1 on his toe remaining at this point he seems to be doing quite well in my opinion. I am  very pleased with the overall appearance today. There is no signs of active infection I do think keeping the callus trimmed down has been of great benefit as far as healing is concerned. I do not think we need to go 3 weeks out like we were in the past I believe that is been somewhat more detrimental than good for him. 02/19/2020 upon evaluation today patient appears to be doing better in regard to his toe ulcer still there is callus buildup around the top of the toe which unfortunately is I think causing some issues here with trapping a little fluid underneath them after cleaning this away. Fortunately there is no evidence of active infection at this time. Overall I feel like the patient is doing quite well which is great news. 03/04/2020 on evaluation today patient appears to be doing well with regard to his toe ulcer though he still has a lot of drying out of the endoform and a lot of callus buildup as well unfortunately. With that being said I think that he is can require some sharp debridement to clear away some of the callus today. We will see what the wound looks like following. 03/11/2020 on evaluation today patient appears to be doing excellent in regard to his toe ulcer. He did have a lot of callus and even some of the nail, growing over top of the area where the wound is trying to heal. For that reason I did actually trim back some of the nail also did go ahead and trim  away some of the callus as well. He tolerated all this without complication the wound appears to be doing well and is showing no signs of infection at this point. Overall I am very pleased. 03/18/2020 upon evaluation today patient appears to be doing well with regard to his toe ulcer. There does not appear to be any significant buildup of callus at this point and overall I do believe that the alginate has been beneficial. Trimming back the toenail also seems to have helped a lot in this regard I think that was causing some rubbing on the end of the toe. Fortunately overall he is doing much better unfortunately he notes that his grandson has been placed on hospice he is 44 and apparently not doing too well. He wants to make a trip to go see him I really think that something he probably should do and not wait for this to try to heal. 04/01/2020 upon evaluation today patient's toe ulcer appears to be doing about the same. He does have some callus buildup slowly he is making progress but were not completely closed as of yet. I am going to need to perform some debridement today to clear this way. Electronic Signature(s) Signed: 04/01/2020 10:24:18 AM By: Worthy Keeler PA-C Entered By: Worthy Keeler on 04/01/2020 10:24:18 Timothy Gibson, Timothy Gibson ([id number]) -------------------------------------------------------------------------------- Physical Exam Details Patient Name: Timothy Gibson Date of Service: 04/01/2020 8:15 AM Medical Record Number: [id number] Patient Account Number: [id number] Date of Birth/Sex: 03/03/1951 (69 y.o. M) Treating RN: Cornell Barman Primary Care Provider: Merrie Roof Other Clinician: Referring Provider: Merrie Roof Treating Provider/Extender: Melburn Hake, Richardine Peppers Weeks in Treatment: 58 Constitutional Well-nourished and well-hydrated in no acute distress. Respiratory normal breathing without difficulty. Psychiatric this patient is able to make decisions and demonstrates good  insight into disease process. Alert and Oriented x 3. pleasant and cooperative. Notes His wound bed actually showed signs of good granulation at this time there was some callus buildup  and some slough I cleaned all this away today without complication post debridement wound bed appears to be doing much better. Electronic Signature(s) Signed: 04/01/2020 10:24:31 AM By: Worthy Keeler PA-C Entered By: Worthy Keeler on 04/01/2020 10:24:31 JAYANT, KRIZ ([id number]) -------------------------------------------------------------------------------- Physician Orders Details Patient Name: Timothy Gibson Date of Service: 04/01/2020 8:15 AM Medical Record Number: [id number] Patient Account Number: [id number] Date of Birth/Sex: 08-07-1950 (69 y.o. M) Treating RN: Grover Canavan Primary Care Provider: Merrie Roof Other Clinician: Referring Provider: Merrie Roof Treating Provider/Extender: Melburn Hake, Carmellia Kreisler Weeks in Treatment: 62 Verbal / Phone Orders: No Diagnosis Coding Wound Cleansing Wound #3 Left Toe Great o Dial antibacterial soap, wash wounds, rinse and pat dry prior to dressing wounds o May Shower, gently pat wound dry prior to applying new dressing. Anesthetic (add to Medication List) Wound #3 Left Toe Great o Topical Lidocaine 4% cream applied to wound bed prior to debridement (In Clinic Only). Primary Wound Dressing Wound #3 Left Toe Great o Silver Alginate Secondary Dressing Wound #3 Left Toe Great o Dry Gauze o Conform/Kerlix Dressing Change Frequency Wound #3 Left Toe Great o Change dressing every other day. o Other: - As needed Follow-up Appointments Wound #3 Left Toe Great o Return Appointment in 2 weeks. Off-Loading Wound #3 Left Toe Great o Open toe surgical shoe with peg assist. - left foot Electronic Signature(s) Signed: 04/01/2020 5:09:50 PM By: Grover Canavan Signed: 04/01/2020 5:29:43 PM By: Worthy Keeler PA-C Entered By: Grover Canavan on 04/01/2020 08:53:10 LOGYN, KENDRICK ([id number]) -------------------------------------------------------------------------------- Problem List Details Patient Name: Timothy Gibson Date of Service: 04/01/2020 8:15 AM Medical Record Number: [id number] Patient Account Number: [id number] Date of Birth/Sex: 04-06-1951 (69 y.o. M) Treating RN: Grover Canavan Primary Care Provider: Merrie Roof Other Clinician: Referring Provider: Merrie Roof Treating Provider/Extender: Melburn Hake, Dasani Thurlow Weeks in Treatment: 5 Active Problems ICD-10 Encounter Code Description Active Date MDM Diagnosis [phone] Other chronic osteomyelitis, left ankle and foot 07/29/2019 No Yes E11.621 Type 2 diabetes mellitus with foot ulcer 07/15/2019 No Yes L97.522 Non-pressure chronic ulcer of other part of left foot with fat layer 07/15/2019 No Yes exposed [place] (primary) hypertension 07/15/2019 No Yes Inactive Problems Resolved Problems Electronic Signature(s) Signed: 04/02/2020 8:57:17 AM By: Ruthine Dose Signed: 04/03/2020 9:48:54 AM By: Worthy Keeler PA-C Previous Signature: 04/01/2020 9:07:18 AM Version By: Worthy Keeler PA-C Entered By: Ruthine Dose on 04/02/2020 08:57:17 Timothy Gibson ([id number]) -------------------------------------------------------------------------------- Progress Note Details Patient Name: Timothy Gibson Date of Service: 04/01/2020 8:15 AM Medical Record Number: [id number] Patient Account Number: [id number] Date of Birth/Sex: May 09, 1951 (69 y.o. M) Treating RN: Cornell Barman Primary Care Provider: Merrie Roof Other Clinician: Referring Provider: Merrie Roof Treating Provider/Extender: Melburn Hake, [name] Weeks in Treatment: 37 Subjective Chief Complaint Information obtained from Patient Left Great Toe History of Present Illness (HPI) 11/27/17 on evaluation today patient presents for initial evaluation concerning an issue that he has been having with his right  great toe which began last Thursday 11/22/17. He has a history of diabetes mellitus type to which he has had for greater than 30 years currently he is on insulin. Subsequently he also has a history of hypertension. On physical exam inspection is also appears he may have some peripheral vascular disease with his ABI's being noncompressible bilaterally. He is on dialysis as well and this is on Monday, Wednesday, and Friday. Patient was hospitalized back in January/February 2019 although this was more for stomach/back pain though they never told me  exactly what was going on. This is according to the patient. Subsequently the ulcer which is between the third and fourth toe when space of the right foot as well is on the plantar aspect of the fourth toe is due to him having cleaned his toes this morning and he states that his finger which is large split the toe tissue in between causing the wounds that we currently see. With that being said he states this has happened before I explained I would definitely recommend that he not do this any longer. He can use a small soft washcloth to clean between the toes without causing trauma. Subsequently the right great toe hatchery does show evidence of necrotic tissue present on the surface of the wound specifically there is callous and dead skin surrounding which is trapping fluid causing problems as far as the wound is concerned. The surface of the wound does show slough although due to his vascular flow I'm not going to sharply debride this today I think I will selectively debride away the necrotic skin as well is callous from around the surrounding so this will not continue to be a moisture issue. Nonetheless the patient has no pain he does have diabetic neuropathy. 12/06/17-He is here in follow-up evaluation for a right great toe ulcer. He is voicing no complaints or concerns. He continues to infrequently/socially smokes cigars with no desire to quit. He has been  compliant with offloading, using open toed surgical shoe; has been compliant using Santyl daily. He continues on clindamycin although takes it inconsistently secondary to indigestion. The plain film x-ray performed on 5/23 impression: Soft tissue swelling of the left first toe and is noted with probable irregular lucency involving distal tuft of first distal phalanx concerning for osteomyelitis. MRI may be performed for further evaluation; MRI ordered. The vascular evaluation performed on 5/24 field non- compressible ABIs bilaterally and reduced TBI bilaterally suggesting significant tibial disease. He will be referred to vascular medicine for further evaluation. 12/13/17-He is here in follow-up evaluation for right great toe ulcer. He has appointment next Thursday for the MRI and vascular evaluation. Wound culture obtained last week grew abundant Klebsiella oxytoca (multidrug sensitivity), and abundant enteric coccus faecalis (sensitive to ampicillin). Ampicillin and Cipro were called in, along with a probiotic. In light of his appointments next Thursday he will follow-up in 2 weeks, we will continue with Santyl 12/27/17-He is here for evaluation for a right great toe ulcer. 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 He did have evaluation by vascular medicine, Dr. Ronalee Belts, on 6/13, at that appointment it was decided for him to undergo angiography with possible intervention but he refused and is still considering. If he chooses to have it done he will contact the office. He has not heard back from either ID offices that he was referred to two weeks ago: [place] and [place]. there is improvement in both appearance and measurement to the ulcer. We will extend antibiotic therapy (amoxicillin 500 every 12, Cipro 500 daily) for additional 2 weeks pending infectious disease consult, he will continue with Santyl daily. He was  reminded to continue with probiotic therapy while on oral antibiotics; he denies any GI disturbance. 01/03/18-He is here in follow-up evaluation for right great toe ulcer. He is decided to not undergo any vascular intervention at this time. He was established with [facility] infectious disease (Dr. Linus Salmons) last week initiated on vancomycin and cefazolin with dialysis  treatment for 6 weeks. We will continue with current treatment plan with Santyl and offloading and he will follow-up in 2 weeks 01/17/18-He is seen in follow-up evaluation for right great toe ulcer. There is improvement in appearance with less nonviable tissue present. We will continue with same treatment plan he will follow-up next week. He is tolerating IV antibiotics without any complications 2/50/53-ZJ is seen in follow up evaluation for right great toe ulcer. There continues to be improvement. He is tolerating IV antibiotics with hemodialysis, completion date of 7/31. We will transition to collagen and and follow-up next week 02/07/18-He is here in follow up for a right great toe ulcer. There is red granulation tissue throughout the wound, improved in appearance. He completed antibiotics yesterday. He saw podiatry last Thursday for nail trimming, at that appointment he periwound callus was trimmed and a culture was obtained; culture negative. He does not have a follow up appointment with infectious disease. We will submit for grafix. 02/14/18-He is here in follow-up evaluation for a right great toe ulcer. There is slow improvement, red granulation tissue throughout. The insurance approval for grafix is pending. We will continue with collagen and he'll follow-up next week 02/21/18-He is seen in follow-up evaluation for right great toe ulcer. There is improvement with red granulation tissue throughout. He was approved for oasis and this was placed today. He will follow up next week 02/28/18-He is seen in follow-up evaluation for right great  toe ulcer. The wound is stable, oasis applied and he'll follow-up next week 03/07/18-He is seen in follow-up violation of her right great toe ulcer. The wound is dry, no evidence of drainage. The wound appears healed today. He was painted with Betadine, instructed to paint with Betadine daily, cover with band-aid for the next week and then cover with band-aid for the following week continue with surgical shoe. He was encouraged to contact the clinic with any evidence of drainage, or change in appearance to toe/wound. He will be discharged from wound care services Readmission: 07/14/18 on evaluation today patient presents for initial evaluation our clinic concerning issues that he is having with his left great toe. We previously saw him in 2019 for his right great toe which was actually worse at that time and subsequently was able to be completely healed in the end although it did take several months. With that being said the patient is currently having an area on his left great toe that occurred initially as result of a blister that came up he's not really sure why or how. Subsequently this was initially treated by his podiatrist though the recommendation there was apparently amputation according to the patient and his daughter who was present with him today. With that being said the patient did not want to proceed with amputation and would like to try to perform wound care and get this to heal without having to go down that road. We were able to do that with his other toe and so he would like to at least give that a try before going forward with amputation. Timothy Gibson, Timothy Gibson ([id number]) Again I completely understand his concern in this regard. With that being said I did discuss with the patient obviously that it's always a chance that we cannot get this to heal with normal wound care measures but obviously we will give it our best shot. He does actually have an appointment scheduled for later today  with the vascular specialist in order to evaluate his blood flow. That was at  the recommendation of his podiatrist as well. Obviously if he is having good arterial flow and nonetheless is still experiencing issues with having the wound delay in healing then it may simply be a matter of we need to initiate more aggressive wound to therapy in order to see if we can get this to heal. No fevers, chills, nausea, or vomiting noted at this time. The patient notes that this woman has been present for approximately three weeks. He is not a smoker. His most recent hemoglobin A1c was 6.2 on 06/18/19. 07/17/2019 on evaluation today patient appears unfortunately to not be doing as well. He did see vascular I did review the note as well. That was on 07/15/2019. Subsequently there can actually be taking him to the OR for an angiogram due to poor arterial flow into this extremity. Apparently the atherosclerotic changes were severe. The patient is stated to be at risk for limb loss. Nonetheless the good news is this is being scheduled fairly quickly he will have the surgery/procedure next Thursday. With that being said I am going also go ahead based on the results of his x-ray which showed some chance of osteomyelitis in the distal portion of the toe get the patient set up for an MRI to further evaluate for the possibility of osteomyelitis obviously if he does have osteomyelitis we need to know so that we can address this appropriately. 07/29/2019 upon evaluation today patient appears to be doing better in general with regard to his toe ulcer. He does have much better blood flow you can actually feel a palpable pulse which appears to be very strong at this time. I did review his arterial intervention/angiogram which did show that he had evidence of sufficient arterial flow restoration at this point. He did have occlusions that were 60 and 80% that residually were only 10 and 25% with no limiting flow. This is excellent news  and hopefully he will continue to show signs of improvement in light of the improved vascular status. With that being said the toe mainly shows somewhat of an eschar today I think that this is fairly stable and as such probably would be best not to really do much in the way of debridement at this point I do believe Betadine would likely be a good option. 08/07/2019 on evaluation today patient actually appears to be doing about the same. The one difference is the eschar that we were just seeing if we can maintain is actually starting to loosen up and leak around the edges I think it is time to actually go ahead and remove this today. Is also no longer stable is very soft. Nonetheless the patient is okay with proceeding with debridement at this point. Fortunately there is no signs of active infection. No fevers, chills, nausea, vomiting, or diarrhea. The patient states that he took his last antibiotic that he had last night. He would need a refill as he will not be seeing infectious disease until February 2. Nonetheless obviously I do not want him to have any break in therapy especially when he is doing as well as he is currently. 08/14/2019 on evaluation today patient appears to be doing quite well all things considered in regard to his toe. I do feel like he is making progress and though this is not completely healed by any means we still have some ways to go I feel like he is making progress which is excellent. There is no sign of active infection at this time systemically.  He does have osteomyelitis of the distal phalanx of his great toe on the left. He did see Dr. Linus Salmons. That is the infectious disease doctor in [place] where he was referred. Dr. Linus Salmons apparently according to the note did not feel like the patient's MRI revealed osteomyelitis but just reactive marrow and subsequently did not recommend any antibiotic therapy whatsoever. To be peripherally honest I am not really in agreement with this  based on the MRI results and what we are seeing and I think he has been responding at least decently well to oral medication as well. Nonetheless we may want to check into the possibility of IV antibiotics being administered at the dialysis center. I will discuss this with Dr. Dellia Nims further. 08/21/2019 upon evaluation today patient appears at this point to be making some progress with regard to his wounds. He has been tolerating the dressing changes without complication. Fortunately there is no evidence of active infection at this time. Overall I feel like he is doing well with the oral antibiotics I discussed with Dr. Dellia Nims the possibility of doing IV antibiotic therapy versus continuing with the oral. His opinion was if the patient is doing well with oral to maybe stick with that. Obviously we can initiate additional Cipro as well just to help ensure that there is nothing worsening here. The Cipro should be beneficial for him as well. That helps cover more gram-negative's were is at the doxycycline is more gram- positive's. 09/02/2019 on evaluation today patient appears to be doing fairly well upon inspection today. With that being said though the patient is continuing to do well he does also continue to develop issues with buildup of necrotic tissue on the distal portion of his toe he does have good arterial flow however. For that reason I am going to suggest that what we may need to do is consider using something such as Santyl to try to keep this free and clear. Also discussed in greater detail HBO therapy with him today please see plan for additional recommendations and results of this discussion. 09/11/2019 upon evaluation today patient appears to be doing well with regard to his toe ulcer. This actually appears to be much better today using the Santyl than it was previous. Fortunately there is no signs of active infection. No fevers, chills, nausea, vomiting, or diarrhea. 09/18/2019 upon  evaluation today patient appears to be doing much better in regard to his wound in general. The Santyl was doing a good job at loosening stuff up here for [place] which is excellent news. With that being said he is still taking the oral antibiotic. I sent this in last for him on 09/03/2019. This was the doxycycline. When this runs out he will actually be complete as far as the treatment is concerned. With that being said I am getting continue the Cipro for 1 additional month. Overall his wound seems to be doing quite well. 09/25/2019 upon evaluation today patient appears to be doing well with regard to his toe ulcer. This is measuring much better and overall seems to be making good progress. Fortunately there is no signs of active infection at this time. No fevers, chills, nausea, vomiting, or diarrhea. 10/02/2019 upon evaluation today patient appears to be doing well with regard to his wound. He has been tolerating the dressing changes without complication. Fortunately there is no signs of active infection at this time. No fevers, chills, nausea, vomiting, or diarrhea. Overall I am extremely pleased with how things seem to be  progressing. I think the wound bed is at the point now where we can likely proceed with additional measures to try to get this healed more quickly I think Dermagraft may be a good possibility for the patient. We can definitely look into seeing about approval for this. 10/09/2019 upon evaluation today patient's wound actually appears to be doing well although it still is progressing somewhat slowly compared to what I would like to see. With that being said I did order Apligraf for the patient which I felt would keep the wound nice and moist without allowing it to dry out and hopefully allow this to heal much more effectively and quickly. The patient actually did obtain approval and therefore we can apply that today. 10/23/2019 upon evaluation today patient's wound actually appears to be  doing significantly better with regard to his toe ulcer. The tissue at the base of the wound is greatly improved and overall I am very pleased with the progress made. We did use Apligraf 2 weeks ago on the toe that did excellent for him much it has completely dissolved after as of the 2 weeks. They only had to change the dressing a few times due to drainage through. Obviously they left the Steri-Strips and Mepitel in place. Unfortunately we do not have the Apligraf available today for application regard therefore have to use a different product here in the clinic today and then order the Apligraf for next Thursday. That will be his second application. 4/22; patient arrived with overhanging skin and subcutaneous tissue on the wound margins this was removed. Apligraf #2 applied Timothy Gibson, Timothy Gibson ([id number]) 11/13/2019 upon evaluation today patient actually appears to be doing well with regard to his foot ulcer. He has been tolerating the dressing changes without complication. The overall wound bed appears to be healthier and has filled in as well as not nearly as deep as it was the tissue is also better quality. Overall I am extremely pleased with how things seem to be progressing. The patient is likewise very happy in this regard. 5/20; wound is measuring smaller had Apligraf #3 last time. Apligraf #4 today 12/12/2019 upon evaluation today patient actually appears to be doing quite well with regard to his wound in my opinion. The ulcer is significantly smaller compared to last time I saw him although that has been several weeks. I do believe the Apligraf has been beneficial he has 1 remaining today this will be application #5. 2/67/1245 upon evaluation today patient appears to be doing well with regard to his toe ulcer. He is measuring about the same today but he has come a long way from where this started. Fortunately there is no evidence of active infection which is great news and overall I am very  pleased with the progress that is been made. Nonetheless at this point we have completed the Apligraf course he did see improvement through this but I am going to switch to something different as of today. 01/08/2020 upon evaluation today patient appears to be doing much better in regards to his toe ulcer this is measuring smaller and overall very pleased with the progress. The Hydrofera Blue seems to be doing a great job. Fortunately there is no evidence of active infection at this time. No fevers, chills, nausea, vomiting, or diarrhea. 01/29/2020 upon evaluation today patient appears to be doing okay in regard to his toe ulcer. The main issue I see is that he actually has trouble right now with callus growing and covering over  the wound bed. This prevents him from being able to get a dressing in contact with the wound bed and subsequently fluid collection underneath. I think that we probably need to see him more frequently to keep this pared down and debrided to the point to allow the dressings to effectively heal this wound. I discussed that with the patient today. 02/05/2020 on evaluation today patient appears to be doing well with regard to his wounds currently. In fact he just has 1 on his toe remaining at this point he seems to be doing quite well in my opinion. I am very pleased with the overall appearance today. There is no signs of active infection I do think keeping the callus trimmed down has been of great benefit as far as healing is concerned. I do not think we need to go 3 weeks out like we were in the past I believe that is been somewhat more detrimental than good for him. 02/19/2020 upon evaluation today patient appears to be doing better in regard to his toe ulcer still there is callus buildup around the top of the toe which unfortunately is I think causing some issues here with trapping a little fluid underneath them after cleaning this away. Fortunately there is no evidence of active  infection at this time. Overall I feel like the patient is doing quite well which is great news. 03/04/2020 on evaluation today patient appears to be doing well with regard to his toe ulcer though he still has a lot of drying out of the endoform and a lot of callus buildup as well unfortunately. With that being said I think that he is can require some sharp debridement to clear away some of the callus today. We will see what the wound looks like following. 03/11/2020 on evaluation today patient appears to be doing excellent in regard to his toe ulcer. He did have a lot of callus and even some of the nail, growing over top of the area where the wound is trying to heal. For that reason I did actually trim back some of the nail also did go ahead and trim away some of the callus as well. He tolerated all this without complication the wound appears to be doing well and is showing no signs of infection at this point. Overall I am very pleased. 03/18/2020 upon evaluation today patient appears to be doing well with regard to his toe ulcer. There does not appear to be any significant buildup of callus at this point and overall I do believe that the alginate has been beneficial. Trimming back the toenail also seems to have helped a lot in this regard I think that was causing some rubbing on the end of the toe. Fortunately overall he is doing much better unfortunately he notes that his grandson has been placed on hospice he is 72 and apparently not doing too well. He wants to make a trip to go see him I really think that something he probably should do and not wait for this to try to heal. 04/01/2020 upon evaluation today patient's toe ulcer appears to be doing about the same. He does have some callus buildup slowly he is making progress but were not completely closed as of yet. I am going to need to perform some debridement today to clear this way. Objective Constitutional Well-nourished and well-hydrated in no  acute distress. Vitals Time Taken: 8:15 AM, Height: 76 in, Weight: 244 lbs, BMI: 29.7, Temperature: 98.2 F, Pulse: 75 bpm, Respiratory  Rate: 18 breaths/min, Blood Pressure: 173/80 mmHg. Respiratory normal breathing without difficulty. Psychiatric this patient is able to make decisions and demonstrates good insight into disease process. Alert and Oriented x 3. pleasant and cooperative. General Notes: His wound bed actually showed signs of good granulation at this time there was some callus buildup and some slough I cleaned all this away today without complication post debridement wound bed appears to be doing much better. Integumentary (Hair, Skin) Timothy Gibson, Timothy Gibson. ([id number]) Wound #3 status is Open. Original cause of wound was Gradually Appeared. The wound is located on the Left Toe Great. The wound measures 0.3cm length x 0.1cm width x 0.2cm depth; 0.024cm^2 area and 0.005cm^3 volume. There is Fat Layer (Subcutaneous Tissue) exposed. There is a medium amount of serous drainage noted. The wound margin is thickened. There is no granulation within the wound bed. There is a small (1-33%) amount of necrotic tissue within the wound bed including Adherent Slough. 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 There was a Excisional Skin/Subcutaneous Tissue Debridement with a total area of 0.03 sq cm performed by [name], Julyssa Kyer E., PA-C. With the following instrument(s): Curette to remove Viable and Non-Viable tissue/material. Material removed includes Callus, Subcutaneous Tissue, and Slough after achieving pain  control using Lidocaine. No specimens were taken. A time out was conducted at 08:45, prior to the start of the procedure. A Minimum amount of bleeding was controlled with Pressure. The procedure was tolerated well with a pain level of 0 throughout and a pain level of 0 following the procedure. Post Debridement Measurements: 0.3cm length x 0.1cm width x 0.3cm depth; 0.007cm^3 volume. Character of Wound/Ulcer Post Debridement is improved. Severity of Tissue Post Debridement is: Fat layer exposed. Post procedure Diagnosis Wound #3: Same as Pre-Procedure Plan Wound Cleansing: Wound #3 Left Toe Great: Dial antibacterial soap, wash wounds, rinse and pat dry prior to dressing wounds May Shower, gently pat wound dry prior to applying new dressing. Anesthetic (add to Medication List): Wound #3 Left Toe Great: Topical Lidocaine 4% cream applied to wound bed prior to debridement (In Clinic Only). Primary Wound Dressing: Wound #3 Left Toe Great: Silver Alginate Secondary Dressing: Wound #3 Left Toe Great: Dry Gauze Conform/Kerlix Dressing Change Frequency: Wound #3 Left Toe Great: Change dressing every other day. Other: - As needed Follow-up Appointments: Wound #3 Left Toe Great: Return Appointment in 2 weeks. Off-Loading: Wound #3 Left Toe Great: Open toe surgical shoe with peg assist. - left foot 1. I am going to suggest currently that we go ahead and continue with the silver alginate dressing as I feel like this is still probably the best option for the patient. 2. I am also can recommend at this time that we continue with the offloading as much as possible with the open toe surgical shoe to try to keep pressure off of this region. Timothy Gibson, Timothy Gibson ([id number]) 3. I am also can recommend the patient continue to change this dressing every other day and as needed if he needs to do daily that is fine as well the need to make sure to cut the alginate into the base of the wound. We will  see  patient back for reevaluation in 2 weeks here in the clinic. If anything worsens or changes patient will contact our office for additional recommendations. Electronic Signature(s) Signed: 04/01/2020 10:25:26 AM By: Worthy Keeler PA-C Entered By: Worthy Keeler on 04/01/2020 10:25:25 Timothy Gibson, Timothy Gibson ([id number]) -------------------------------------------------------------------------------- SuperBill Details Patient Name: Timothy Gibson Date of Service: 04/01/2020 Medical Record Number: [id number] Patient Account Number: [id number] Date of Birth/Sex: 12-19-1950 (69 y.o. M) Treating RN: Grover Canavan Primary Care Provider: Merrie Roof Other Clinician: Referring Provider: Merrie Roof Treating Provider/Extender: Melburn Hake, Brannan Cassedy Weeks in Treatment: 37 Diagnosis Coding ICD-10 Codes Code Description [phone] Other chronic osteomyelitis, left ankle and foot E11.621 Type 2 diabetes mellitus with foot ulcer L97.522 Non-pressure chronic ulcer of other part of left foot with fat layer exposed I10 Essential (primary) hypertension Facility Procedures CPT4 Code: [id number] Description: 50277 - WOUND CARE VISIT-LEV 3 EST PT Modifier: Quantity: 1 CPT4 Code: [id number] Description: 67209 - DEB SUBQ TISSUE 20 SQ CM/< Modifier: Quantity: 1 CPT4 Code: Description: ICD-10 Diagnosis Description L97.522 Non-pressure chronic ulcer of other part of left foot with fat layer expo Modifier: sed Quantity: Physician Procedures CPT4 Code: [id number] Description: 11042 - WC PHYS SUBQ TISS 20 SQ CM Modifier: Quantity: 1 CPT4 Code: Description: ICD-10 Diagnosis Description L97.522 Non-pressure chronic ulcer of other part of left foot with fat layer exp Modifier: osed Quantity: Electronic Signature(s) Signed: 04/01/2020 5:16:53 PM By: Lorine Bears RCP, RRT, CHT Signed: 04/01/2020 5:29:43 PM By: Worthy Keeler PA-C Previous Signature: 04/01/2020 10:25:43 AM Version By: Worthy Keeler  PA-C Entered By: Lorine Bears on 04/01/2020 15:56:36

## 2020-04-05 DIAGNOSIS — Z992 Dependence on renal dialysis: Secondary | ICD-10-CM | POA: Diagnosis not present

## 2020-04-05 DIAGNOSIS — D631 Anemia in chronic kidney disease: Secondary | ICD-10-CM | POA: Diagnosis not present

## 2020-04-05 DIAGNOSIS — N2581 Secondary hyperparathyroidism of renal origin: Secondary | ICD-10-CM | POA: Diagnosis not present

## 2020-04-05 DIAGNOSIS — D509 Iron deficiency anemia, unspecified: Secondary | ICD-10-CM | POA: Diagnosis not present

## 2020-04-05 DIAGNOSIS — N186 End stage renal disease: Secondary | ICD-10-CM | POA: Diagnosis not present

## 2020-04-07 DIAGNOSIS — D631 Anemia in chronic kidney disease: Secondary | ICD-10-CM | POA: Diagnosis not present

## 2020-04-07 DIAGNOSIS — Z992 Dependence on renal dialysis: Secondary | ICD-10-CM | POA: Diagnosis not present

## 2020-04-07 DIAGNOSIS — N186 End stage renal disease: Secondary | ICD-10-CM | POA: Diagnosis not present

## 2020-04-07 DIAGNOSIS — D509 Iron deficiency anemia, unspecified: Secondary | ICD-10-CM | POA: Diagnosis not present

## 2020-04-07 DIAGNOSIS — N2581 Secondary hyperparathyroidism of renal origin: Secondary | ICD-10-CM | POA: Diagnosis not present

## 2020-04-08 DIAGNOSIS — N186 End stage renal disease: Secondary | ICD-10-CM | POA: Diagnosis not present

## 2020-04-08 DIAGNOSIS — Z992 Dependence on renal dialysis: Secondary | ICD-10-CM | POA: Diagnosis not present

## 2020-04-09 DIAGNOSIS — N186 End stage renal disease: Secondary | ICD-10-CM | POA: Diagnosis not present

## 2020-04-09 DIAGNOSIS — N2581 Secondary hyperparathyroidism of renal origin: Secondary | ICD-10-CM | POA: Diagnosis not present

## 2020-04-09 DIAGNOSIS — D631 Anemia in chronic kidney disease: Secondary | ICD-10-CM | POA: Diagnosis not present

## 2020-04-09 DIAGNOSIS — Z992 Dependence on renal dialysis: Secondary | ICD-10-CM | POA: Diagnosis not present

## 2020-04-09 DIAGNOSIS — D509 Iron deficiency anemia, unspecified: Secondary | ICD-10-CM | POA: Diagnosis not present

## 2020-04-12 DIAGNOSIS — Z992 Dependence on renal dialysis: Secondary | ICD-10-CM | POA: Diagnosis not present

## 2020-04-12 DIAGNOSIS — D631 Anemia in chronic kidney disease: Secondary | ICD-10-CM | POA: Diagnosis not present

## 2020-04-12 DIAGNOSIS — N2581 Secondary hyperparathyroidism of renal origin: Secondary | ICD-10-CM | POA: Diagnosis not present

## 2020-04-12 DIAGNOSIS — D509 Iron deficiency anemia, unspecified: Secondary | ICD-10-CM | POA: Diagnosis not present

## 2020-04-12 DIAGNOSIS — N186 End stage renal disease: Secondary | ICD-10-CM | POA: Diagnosis not present

## 2020-04-14 DIAGNOSIS — N2581 Secondary hyperparathyroidism of renal origin: Secondary | ICD-10-CM | POA: Diagnosis not present

## 2020-04-14 DIAGNOSIS — D631 Anemia in chronic kidney disease: Secondary | ICD-10-CM | POA: Diagnosis not present

## 2020-04-14 DIAGNOSIS — N186 End stage renal disease: Secondary | ICD-10-CM | POA: Diagnosis not present

## 2020-04-14 DIAGNOSIS — D509 Iron deficiency anemia, unspecified: Secondary | ICD-10-CM | POA: Diagnosis not present

## 2020-04-14 DIAGNOSIS — Z992 Dependence on renal dialysis: Secondary | ICD-10-CM | POA: Diagnosis not present

## 2020-04-15 ENCOUNTER — Encounter: Payer: Medicare Other | Attending: Physician Assistant | Admitting: Physician Assistant

## 2020-04-15 ENCOUNTER — Other Ambulatory Visit: Payer: Self-pay

## 2020-04-15 DIAGNOSIS — I10 Essential (primary) hypertension: Secondary | ICD-10-CM | POA: Diagnosis not present

## 2020-04-15 DIAGNOSIS — L97521 Non-pressure chronic ulcer of other part of left foot limited to breakdown of skin: Secondary | ICD-10-CM | POA: Diagnosis not present

## 2020-04-15 DIAGNOSIS — M86672 Other chronic osteomyelitis, left ankle and foot: Secondary | ICD-10-CM | POA: Diagnosis not present

## 2020-04-15 DIAGNOSIS — E11621 Type 2 diabetes mellitus with foot ulcer: Secondary | ICD-10-CM | POA: Diagnosis not present

## 2020-04-15 DIAGNOSIS — L97522 Non-pressure chronic ulcer of other part of left foot with fat layer exposed: Secondary | ICD-10-CM | POA: Insufficient documentation

## 2020-04-15 NOTE — Progress Notes (Addendum)
EAVEN, SCHWAGER ([id number]) Visit Report for 04/15/2020 Chief Complaint Document Details Patient Name: Timothy Gibson, Timothy Gibson Date of Service: 04/15/2020 8:15 AM Medical Record Number: [id number] Patient Account Number: [id number] Date of Birth/Sex: 1951/03/29 (69 y.o. M) Treating RN: Grover Canavan Primary Care Provider: Merrie Roof Other Clinician: Referring Provider: Merrie Roof Treating Provider/Extender: Melburn Hake, Margarito Dehaas Weeks in Treatment: 58 Information Obtained from: Patient Chief Complaint Left Great Toe Electronic Signature(s) Signed: 04/15/2020 8:26:50 AM By: Worthy Keeler PA-C Entered By: Worthy Keeler on 04/15/2020 08:26:49 DANTA, BAUMGARDNER ([id number]) -------------------------------------------------------------------------------- Debridement Details Patient Name: Timothy Gibson Date of Service: 04/15/2020 8:15 AM Medical Record Number: [id number] Patient Account Number: [id number] Date of Birth/Sex: [birth year] (69 y.o. M) Treating RN: Cornell Barman Primary Care Provider: Merrie Roof Other Clinician: Referring Provider: Merrie Roof Treating Provider/Extender: Melburn Hake, Tahj Njoku Weeks in Treatment: 39 Debridement Performed for Wound #3 Left Toe Great Assessment: Performed By: Physician [name], Hermione Havlicek E., PA-C Debridement Type: Debridement Severity of Tissue Pre Debridement: Fat layer exposed Level of Consciousness (Pre- Awake and Alert procedure): Pre-procedure Verification/Time Out Yes - 08:30 Taken: Start Time: 08:30 Total Area Debrided (L x W): 0.3 (cm) x 0.1 (cm) = 0.03 (cm) Tissue and other material Callus, Slough, Subcutaneous, Slough debrided: Level: Skin/Subcutaneous Tissue Debridement Description: Excisional Instrument: Curette Bleeding: Minimum Hemostasis Achieved: Pressure Response to Treatment: Procedure was tolerated well Level of Consciousness (Post- Awake and Alert procedure): Post Debridement Measurements of Total Wound Length:  (cm) 0.4 Width: (cm) 0.2 Depth: (cm) 0.4 Volume: (cm) 0.025 Character of Wound/Ulcer Post Debridement: Stable Severity of Tissue Post Debridement: Limited to breakdown of skin Post Procedure Diagnosis Same as Pre-procedure Electronic Signature(s) Signed: 04/15/2020 9:58:06 AM By: Worthy Keeler PA-C Signed: 04/15/2020 5:05:57 PM By: Gretta Cool, BSN, RN, CWS, Kim RN, BSN Entered By: Gretta Cool, BSN, RN, CWS, Kim on 04/15/2020 08:31:53 Timothy Gibson ([id number]) -------------------------------------------------------------------------------- HPI Details Patient Name: Timothy Gibson Date of Service: 04/15/2020 8:15 AM Medical Record Number: [id number] Patient Account Number: [id number] Date of Birth/Sex: 1950/10/07 (69 y.o. M) Treating RN: Grover Canavan Primary Care Provider: Merrie Roof Other Clinician: Referring Provider: Merrie Roof Treating Provider/Extender: Melburn Hake, Trany Chernick Weeks in Treatment: 22 History of Present Illness HPI Description: 11/27/17 on evaluation today patient presents for initial evaluation concerning an issue that he has been having with his right great toe which began last Thursday 11/22/17. He has a history of diabetes mellitus type to which he has had for greater than 30 years currently he is on insulin. Subsequently he also has a history of hypertension. On physical exam inspection is also appears he may have some peripheral vascular disease with his ABI's being noncompressible bilaterally. He is on dialysis as well and this is on Monday, Wednesday, and Friday. Patient was hospitalized back in January/February 2019 although this was more for stomach/back pain though they never told me exactly what was going on. This is according to the patient. Subsequently the ulcer which is between the third and fourth toe when space of the right foot as well is on the plantar aspect of the fourth toe is due to him having cleaned his toes this morning and he states that his  finger which is large split the toe tissue in between causing the wounds that we currently see. With that being said he states this has happened before I explained I would definitely recommend that he not do this any longer. He can use a small soft washcloth to clean between the toes without  causing trauma. Subsequently the right great toe hatchery does show evidence of necrotic tissue present on the surface of the wound specifically there is callous and dead skin surrounding which is trapping fluid causing problems as far as the wound is concerned. The surface of the wound does show slough although due to his vascular flow I'm not going to sharply debride this today I think I will selectively debride away the necrotic skin as well is callous from around the surrounding so this will not continue to be a moisture issue. Nonetheless the patient has no pain he does have diabetic neuropathy. 12/06/17-He is here in follow-up evaluation for a right great toe ulcer. He is voicing no complaints or concerns. He continues to infrequently/socially smokes cigars with no desire to quit. He has been compliant with offloading, using open toed surgical shoe; has been compliant using Santyl daily. He continues on clindamycin although takes it inconsistently secondary to indigestion. The plain film x-ray performed on 5/23 impression: Soft tissue swelling of the left first toe and is noted with probable irregular lucency involving distal tuft of first distal phalanx concerning for osteomyelitis. MRI may be performed for further evaluation; MRI ordered. The vascular evaluation performed on 5/24 field non- compressible ABIs bilaterally and reduced TBI bilaterally suggesting significant tibial disease. He will be referred to vascular medicine for further evaluation. 12/13/17-He is here in follow-up evaluation for right great toe ulcer. He has appointment next Thursday for the MRI and vascular evaluation. Wound culture  obtained last week grew abundant Klebsiella oxytoca (multidrug sensitivity), and abundant enteric coccus faecalis (sensitive to ampicillin). Ampicillin and Cipro were called in, along with a probiotic. In light of his appointments next Thursday he will follow-up in 2 weeks, we will continue with Santyl 12/27/17-He is here for evaluation for a right great toe ulcer. 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 He did have evaluation by vascular medicine, Dr. Ronalee Belts, on 6/13, at that appointment it was decided for him to undergo angiography with possible intervention but he refused and is still considering. If he chooses to have it done he will contact the office. He has not heard back from either ID offices that he was referred to two weeks ago: [place] and [place]. there is improvement in both appearance and measurement to the ulcer. We will extend antibiotic therapy (amoxicillin 500 every 12, Cipro 500 daily) for additional 2 weeks pending infectious disease consult, he will continue with Santyl daily. He was reminded to continue with probiotic therapy while on oral antibiotics; he denies any GI disturbance. 01/03/18-He is here in follow-up evaluation for right great toe ulcer. He is decided to not undergo any vascular intervention at this time. He was established with [facility] infectious disease (Dr. Linus Salmons) last week initiated on vancomycin and cefazolin with dialysis treatment for 6 weeks. We will continue with current treatment plan with Santyl and offloading and he will follow-up in 2 weeks 01/17/18-He is seen in follow-up evaluation for right great toe ulcer. There is improvement in appearance with less nonviable tissue present. We will continue with same treatment plan he will follow-up next week. He is tolerating IV antibiotics without any complications 12/28/28-QM is seen in follow up evaluation for right great toe  ulcer. There continues to be improvement. He is tolerating IV antibiotics with hemodialysis, completion date of 7/31. We will transition to collagen and and follow-up next week 02/07/18-He is here in follow up  for a right great toe ulcer. There is red granulation tissue throughout the wound, improved in appearance. He completed antibiotics yesterday. He saw podiatry last Thursday for nail trimming, at that appointment he periwound callus was trimmed and a culture was obtained; culture negative. He does not have a follow up appointment with infectious disease. We will submit for grafix. 02/14/18-He is here in follow-up evaluation for a right great toe ulcer. There is slow improvement, red granulation tissue throughout. The insurance approval for grafix is pending. We will continue with collagen and he'll follow-up next week 02/21/18-He is seen in follow-up evaluation for right great toe ulcer. There is improvement with red granulation tissue throughout. He was approved for oasis and this was placed today. He will follow up next week 02/28/18-He is seen in follow-up evaluation for right great toe ulcer. The wound is stable, oasis applied and he'll follow-up next week 03/07/18-He is seen in follow-up violation of her right great toe ulcer. The wound is dry, no evidence of drainage. The wound appears healed today. He was painted with Betadine, instructed to paint with Betadine daily, cover with band-aid for the next week and then cover with band-aid for the following week continue with surgical shoe. He was encouraged to contact the clinic with any evidence of drainage, or change in appearance to toe/wound. He will be discharged from wound care services Readmission: 07/14/18 on evaluation today patient presents for initial evaluation our clinic concerning issues that he is having with his left great toe. We previously saw him in 2019 for his right great toe which was actually worse at that time and subsequently  was able to be completely healed in the end although it did take several months. With that being said the patient is currently having an area on his left great toe that occurred initially as result of a blister that came up he's not really sure why or how. Subsequently this was initially treated by his podiatrist though the recommendation there was apparently amputation according to the patient and his daughter who was present with him today. With that being said the patient did not want to proceed with amputation and would like to try to perform wound care and get this to heal without having to go down that road. We were able to do that with his other toe and so he would like to at least give that a try before going forward with amputation. Again I completely understand his concern in this regard. With that being said I did discuss with the patient obviously that it's always a chance that we cannot get this to heal with normal wound care measures but obviously we will give it our best shot. He does actually have an appointment scheduled for later today with the vascular specialist in order to evaluate his blood flow. That was at the recommendation of his podiatrist as well. Obviously if he is having good arterial flow and nonetheless is still experiencing issues with having the wound delay in healing then it may simply be a matter of we need to initiate more aggressive wound to therapy in order to see if we can get this to heal. No fevers, chills, MENACHEM, URBANEK. ([id number]) nausea, or vomiting noted at this time. The patient notes that this woman has been present for approximately three weeks. He is not a smoker. His most recent hemoglobin A1c was 6.2 on 06/18/19. 07/17/2019 on evaluation today patient appears unfortunately to not be doing as well. He did  see vascular I did review the note as well. That was on 07/15/2019. Subsequently there can actually be taking him to the OR for an angiogram due to  poor arterial flow into this extremity. Apparently the atherosclerotic changes were severe. The patient is stated to be at risk for limb loss. Nonetheless the good news is this is being scheduled fairly quickly he will have the surgery/procedure next Thursday. With that being said I am going also go ahead based on the results of his x-ray which showed some chance of osteomyelitis in the distal portion of the toe get the patient set up for an MRI to further evaluate for the possibility of osteomyelitis obviously if he does have osteomyelitis we need to know so that we can address this appropriately. 07/29/2019 upon evaluation today patient appears to be doing better in general with regard to his toe ulcer. He does have much better blood flow you can actually feel a palpable pulse which appears to be very strong at this time. I did review his arterial intervention/angiogram which did show that he had evidence of sufficient arterial flow restoration at this point. He did have occlusions that were 60 and 80% that residually were only 10 and 25% with no limiting flow. This is excellent news and hopefully he will continue to show signs of improvement in light of the improved vascular status. With that being said the toe mainly shows somewhat of an eschar today I think that this is fairly stable and as such probably would be best not to really do much in the way of debridement at this point I do believe Betadine would likely be a good option. 08/07/2019 on evaluation today patient actually appears to be doing about the same. The one difference is the eschar that we were just seeing if we can maintain is actually starting to loosen up and leak around the edges I think it is time to actually go ahead and remove this today. Is also no longer stable is very soft. Nonetheless the patient is okay with proceeding with debridement at this point. Fortunately there is no signs of active infection. No fevers, chills,  nausea, vomiting, or diarrhea. The patient states that he took his last antibiotic that he had last night. He would need a refill as he will not be seeing infectious disease until February 2. Nonetheless obviously I do not want him to have any break in therapy especially when he is doing as well as he is currently. 08/14/2019 on evaluation today patient appears to be doing quite well all things considered in regard to his toe. I do feel like he is making progress and though this is not completely healed by any means we still have some ways to go I feel like he is making progress which is excellent. There is no sign of active infection at this time systemically. He does have osteomyelitis of the distal phalanx of his great toe on the left. He did see Dr. Linus Salmons. That is the infectious disease doctor in [place] where he was referred. Dr. Linus Salmons apparently according to the note did not feel like the patient's MRI revealed osteomyelitis but just reactive marrow and subsequently did not recommend any antibiotic therapy whatsoever. To be peripherally honest I am not really in agreement with this based on the MRI results and what we are seeing and I think he has been responding at least decently well to oral medication as well. Nonetheless we may want to check into the  possibility of IV antibiotics being administered at the dialysis center. I will discuss this with Dr. Dellia Nims further. 08/21/2019 upon evaluation today patient appears at this point to be making some progress with regard to his wounds. He has been tolerating the dressing changes without complication. Fortunately there is no evidence of active infection at this time. Overall I feel like he is doing well with the oral antibiotics I discussed with Dr. Dellia Nims the possibility of doing IV antibiotic therapy versus continuing with the oral. His opinion was if the patient is doing well with oral to maybe stick with that. Obviously we can initiate  additional Cipro as well just to help ensure that there is nothing worsening here. The Cipro should be beneficial for him as well. That helps cover more gram-negative's were is at the doxycycline is more gram- positive's. 09/02/2019 on evaluation today patient appears to be doing fairly well upon inspection today. With that being said though the patient is continuing to do well he does also continue to develop issues with buildup of necrotic tissue on the distal portion of his toe he does have good arterial flow however. For that reason I am going to suggest that what we may need to do is consider using something such as Santyl to try to keep this free and clear. Also discussed in greater detail HBO therapy with him today please see plan for additional recommendations and results of this discussion. 09/11/2019 upon evaluation today patient appears to be doing well with regard to his toe ulcer. This actually appears to be much better today using the Santyl than it was previous. Fortunately there is no signs of active infection. No fevers, chills, nausea, vomiting, or diarrhea. 09/18/2019 upon evaluation today patient appears to be doing much better in regard to his wound in general. The Santyl was doing a good job at loosening stuff up here for [place] which is excellent news. With that being said he is still taking the oral antibiotic. I sent this in last for him on 09/03/2019. This was the doxycycline. When this runs out he will actually be complete as far as the treatment is concerned. With that being said I am getting continue the Cipro for 1 additional month. Overall his wound seems to be doing quite well. 09/25/2019 upon evaluation today patient appears to be doing well with regard to his toe ulcer. This is measuring much better and overall seems to be making good progress. Fortunately there is no signs of active infection at this time. No fevers, chills, nausea, vomiting, or diarrhea. 10/02/2019 upon  evaluation today patient appears to be doing well with regard to his wound. He has been tolerating the dressing changes without complication. Fortunately there is no signs of active infection at this time. No fevers, chills, nausea, vomiting, or diarrhea. Overall I am extremely pleased with how things seem to be progressing. I think the wound bed is at the point now where we can likely proceed with additional measures to try to get this healed more quickly I think Dermagraft may be a good possibility for the patient. We can definitely look into seeing about approval for this. 10/09/2019 upon evaluation today patient's wound actually appears to be doing well although it still is progressing somewhat slowly compared to what I would like to see. With that being said I did order Apligraf for the patient which I felt would keep the wound nice and moist without allowing it to dry out and hopefully allow this to  heal much more effectively and quickly. The patient actually did obtain approval and therefore we can apply that today. 10/23/2019 upon evaluation today patient's wound actually appears to be doing significantly better with regard to his toe ulcer. The tissue at the base of the wound is greatly improved and overall I am very pleased with the progress made. We did use Apligraf 2 weeks ago on the toe that did excellent for him much it has completely dissolved after as of the 2 weeks. They only had to change the dressing a few times due to drainage through. Obviously they left the Steri-Strips and Mepitel in place. Unfortunately we do not have the Apligraf available today for application regard therefore have to use a different product here in the clinic today and then order the Apligraf for next Thursday. That will be his second application. 4/22; patient arrived with overhanging skin and subcutaneous tissue on the wound margins this was removed. Apligraf #2 applied 11/13/2019 upon evaluation today patient  actually appears to be doing well with regard to his foot ulcer. He has been tolerating the dressing changes without complication. The overall wound bed appears to be healthier and has filled in as well as not nearly as deep as it was the tissue is also better quality. Overall I am extremely pleased with how things seem to be progressing. The patient is likewise very happy in this regard. 5/20; wound is measuring smaller had Apligraf #3 last time. Apligraf #4 today BUCK, MCAFFEE ([id number]) 12/12/2019 upon evaluation today patient actually appears to be doing quite well with regard to his wound in my opinion. The ulcer is significantly smaller compared to last time I saw him although that has been several weeks. I do believe the Apligraf has been beneficial he has 1 remaining today this will be application #5. 2/67/1245 upon evaluation today patient appears to be doing well with regard to his toe ulcer. He is measuring about the same today but he has come a long way from where this started. Fortunately there is no evidence of active infection which is great news and overall I am very pleased with the progress that is been made. Nonetheless at this point we have completed the Apligraf course he did see improvement through this but I am going to switch to something different as of today. 01/08/2020 upon evaluation today patient appears to be doing much better in regards to his toe ulcer this is measuring smaller and overall very pleased with the progress. The Hydrofera Blue seems to be doing a great job. Fortunately there is no evidence of active infection at this time. No fevers, chills, nausea, vomiting, or diarrhea. 01/29/2020 upon evaluation today patient appears to be doing okay in regard to his toe ulcer. The main issue I see is that he actually has trouble right now with callus growing and covering over the wound bed. This prevents him from being able to get a dressing in contact with the  wound bed and subsequently fluid collection underneath. I think that we probably need to see him more frequently to keep this pared down and debrided to the point to allow the dressings to effectively heal this wound. I discussed that with the patient today. 02/05/2020 on evaluation today patient appears to be doing well with regard to his wounds currently. In fact he just has 1 on his toe remaining at this point he seems to be doing quite well in my opinion. I am very pleased with the overall  appearance today. There is no signs of active infection I do think keeping the callus trimmed down has been of great benefit as far as healing is concerned. I do not think we need to go 3 weeks out like we were in the past I believe that is been somewhat more detrimental than good for him. 02/19/2020 upon evaluation today patient appears to be doing better in regard to his toe ulcer still there is callus buildup around the top of the toe which unfortunately is I think causing some issues here with trapping a little fluid underneath them after cleaning this away. Fortunately there is no evidence of active infection at this time. Overall I feel like the patient is doing quite well which is great news. 03/04/2020 on evaluation today patient appears to be doing well with regard to his toe ulcer though he still has a lot of drying out of the endoform and a lot of callus buildup as well unfortunately. With that being said I think that he is can require some sharp debridement to clear away some of the callus today. We will see what the wound looks like following. 03/11/2020 on evaluation today patient appears to be doing excellent in regard to his toe ulcer. He did have a lot of callus and even some of the nail, growing over top of the area where the wound is trying to heal. For that reason I did actually trim back some of the nail also did go ahead and trim away some of the callus as well. He tolerated all this without  complication the wound appears to be doing well and is showing no signs of infection at this point. Overall I am very pleased. 03/18/2020 upon evaluation today patient appears to be doing well with regard to his toe ulcer. There does not appear to be any significant buildup of callus at this point and overall I do believe that the alginate has been beneficial. Trimming back the toenail also seems to have helped a lot in this regard I think that was causing some rubbing on the end of the toe. Fortunately overall he is doing much better unfortunately he notes that his grandson has been placed on hospice he is 38 and apparently not doing too well. He wants to make a trip to go see him I really think that something he probably should do and not wait for this to try to heal. 04/01/2020 upon evaluation today patient's toe ulcer appears to be doing about the same. He does have some callus buildup slowly he is making progress but were not completely closed as of yet. I am going to need to perform some debridement today to clear this way. 04/15/2020 on evaluation today patient appears to be doing well in general in regard to his wounds there is no signs of active infection in regard to the toe obviously although he does have a lot of drainage which does raise a bit of a question in that regard. I did discuss with him possibilities for we may want to do treatment wise to try to help out with this. With that being said I Georgina Peer probably start with a topical antibiotic and continuing with the current dressing for the time being. Electronic Signature(s) Signed: 04/15/2020 8:38:24 AM By: Worthy Keeler PA-C Entered By: Worthy Keeler on 04/15/2020 08:38:24 DAIQUAN, RESNIK ([id number]) -------------------------------------------------------------------------------- Physical Exam Details Patient Name: Timothy Gibson Date of Service: 04/15/2020 8:15 AM Medical Record Number: [id number] Patient Account Number:  [id number] Date of Birth/Sex: December 29, 1950 (69 y.o. M) Treating RN: Grover Canavan Primary Care Provider: Merrie Roof Other Clinician: Referring Provider: Merrie Roof Treating Provider/Extender: Melburn Hake, Dianah Pruett Weeks in Treatment: 21 Constitutional Well-nourished and well-hydrated in no acute distress. Respiratory normal breathing without difficulty. Psychiatric this patient is able to make decisions and demonstrates good insight into disease process. Alert and Oriented x 3. pleasant and cooperative. Notes Patient's wound bed actually showed signs of good granulation at this time there did not appear to be any evidence of active infection which is great news overall very pleased in that regard. No fevers, chills, nausea, vomiting, or diarrhea. I do think that with the amount of drainage he has we may want to consider a topical antibiotic ointment, gentamicin, to start with and then will see how things do with the current treatment regimen. Electronic Signature(s) Signed: 04/15/2020 8:38:49 AM By: Worthy Keeler PA-C Entered By: Worthy Keeler on 04/15/2020 08:38:49 CIEL, YANES ([id number]) -------------------------------------------------------------------------------- Physician Orders Details Patient Name: Timothy Gibson Date of Service: 04/15/2020 8:15 AM Medical Record Number: [id number] Patient Account Number: [id number] Date of Birth/Sex: [birth year] (69 y.o. M) Treating RN: Cornell Barman Primary Care Provider: Merrie Roof Other Clinician: Referring Provider: Merrie Roof Treating Provider/Extender: Melburn Hake, Jeven Topper Weeks in Treatment: 30 Verbal / Phone Orders: No Diagnosis Coding ICD-10 Coding Code Description [phone] Other chronic osteomyelitis, left ankle and foot E11.621 Type 2 diabetes mellitus with foot ulcer L97.522 Non-pressure chronic ulcer of other part of left foot with fat layer exposed I10 Essential (primary) hypertension Wound Cleansing Wound #3 Left Toe  Great o Dial antibacterial soap, wash wounds, rinse and pat dry prior to dressing wounds o May Shower, gently pat wound dry prior to applying new dressing. Anesthetic (add to Medication List) Wound #3 Left Toe Great o Topical Lidocaine 4% cream applied to wound bed prior to debridement (In Clinic Only). Skin Barriers/Peri-Wound Care Wound #3 Left Toe Great o Other: - Gentamycin cream Primary Wound Dressing Wound #3 Left Toe Great o Silver Alginate Secondary Dressing Wound #3 Left Toe Great o Dry Gauze o Conform/Kerlix Dressing Change Frequency Wound #3 Left Toe Great o Change dressing every other day. o Other: - As needed Follow-up Appointments Wound #3 Left Toe Great o Return Appointment in 2 weeks. Off-Loading Wound #3 Left Toe Great o Open toe surgical shoe with peg assist. - left foot Patient Medications Allergies: No Known Drug Allergies Notifications Medication Indication Start End gentamicin 04/15/2020 TORETTO, TINGLER ([id number]) Notifications Medication Indication Start End DOSE topical 0.1 % cream - cream topical applied in a thin film to the wound bed the apply dressing every other day as directed Electronic Signature(s) Signed: 04/15/2020 8:40:26 AM By: Worthy Keeler PA-C Entered By: Worthy Keeler on 04/15/2020 08:40:25 DONAVEN, CRISWELL ([id number]) -------------------------------------------------------------------------------- Problem List Details Patient Name: Timothy Gibson Date of Service: 04/15/2020 8:15 AM Medical Record Number: [id number] Patient Account Number: [id number] Date of Birth/Sex: Dec 02, 1950 (69 y.o. M) Treating RN: Grover Canavan Primary Care Provider: Merrie Roof Other Clinician: Referring Provider: Merrie Roof Treating Provider/Extender: Melburn Hake, Zerek Litsey Weeks in Treatment: 50 Active Problems ICD-10 Encounter Code Description Active Date MDM Diagnosis [phone] Other chronic osteomyelitis, left ankle  and foot 07/29/2019 No Yes E11.621 Type 2 diabetes mellitus with foot ulcer 07/15/2019 No Yes L97.522 Non-pressure chronic ulcer of other part of left foot with fat layer 07/15/2019 No Yes exposed Reagan (primary) hypertension 07/15/2019 No Yes Inactive Problems Resolved Problems Electronic Signature(s) Signed:  04/15/2020 8:26:44 AM By: Worthy Keeler PA-C Entered By: Worthy Keeler on 04/15/2020 08:26:43 Timothy Gibson ([id number]) -------------------------------------------------------------------------------- Progress Note Details Patient Name: Timothy Gibson Date of Service: 04/15/2020 8:15 AM Medical Record Number: [id number] Patient Account Number: [id number] Date of Birth/Sex: [birth year] (69 y.o. M) Treating RN: Grover Canavan Primary Care Provider: Merrie Roof Other Clinician: Referring Provider: Merrie Roof Treating Provider/Extender: Melburn Hake, Rhilee Currin Weeks in Treatment: 65 Subjective Chief Complaint Information obtained from Patient Left Great Toe History of Present Illness (HPI) 11/27/17 on evaluation today patient presents for initial evaluation concerning an issue that he has been having with his right great toe which began last Thursday 11/22/17. He has a history of diabetes mellitus type to which he has had for greater than 30 years currently he is on insulin. Subsequently he also has a history of hypertension. On physical exam inspection is also appears he may have some peripheral vascular disease with his ABI's being noncompressible bilaterally. He is on dialysis as well and this is on Monday, Wednesday, and Friday. Patient was hospitalized back in January/February 2019 although this was more for stomach/back pain though they never told me exactly what was going on. This is according to the patient. Subsequently the ulcer which is between the third and fourth toe when space of the right foot as well is on the plantar aspect of the fourth toe is due to him having  cleaned his toes this morning and he states that his finger which is large split the toe tissue in between causing the wounds that we currently see. With that being said he states this has happened before I explained I would definitely recommend that he not do this any longer. He can use a small soft washcloth to clean between the toes without causing trauma. Subsequently the right great toe hatchery does show evidence of necrotic tissue present on the surface of the wound specifically there is callous and dead skin surrounding which is trapping fluid causing problems as far as the wound is concerned. The surface of the wound does show slough although due to his vascular flow I'm not going to sharply debride this today I think I will selectively debride away the necrotic skin as well is callous from around the surrounding so this will not continue to be a moisture issue. Nonetheless the patient has no pain he does have diabetic neuropathy. 12/06/17-He is here in follow-up evaluation for a right great toe ulcer. He is voicing no complaints or concerns. He continues to infrequently/socially smokes cigars with no desire to quit. He has been compliant with offloading, using open toed surgical shoe; has been compliant using Santyl daily. He continues on clindamycin although takes it inconsistently secondary to indigestion. The plain film x-ray performed on 5/23 impression: Soft tissue swelling of the left first toe and is noted with probable irregular lucency involving distal tuft of first distal phalanx concerning for osteomyelitis. MRI may be performed for further evaluation; MRI ordered. The vascular evaluation performed on 5/24 field non- compressible ABIs bilaterally and reduced TBI bilaterally suggesting significant tibial disease. He will be referred to vascular medicine for further evaluation. 12/13/17-He is here in follow-up evaluation for right great toe ulcer. He has appointment next Thursday for  the MRI and vascular evaluation. Wound culture obtained last week grew abundant Klebsiella oxytoca (multidrug sensitivity), and abundant enteric coccus faecalis (sensitive to ampicillin). Ampicillin and Cipro were called in, along with a probiotic. In light of his appointments next Thursday he  will follow-up in 2 weeks, we will continue with Santyl 12/27/17-He is here for evaluation for a right great toe ulcer. 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 He did have evaluation by vascular medicine, Dr. Ronalee Belts, on 6/13, at that appointment it was decided for him to undergo angiography with possible intervention but he refused and is still considering. If he chooses to have it done he will contact the office. He has not heard back from either ID offices that he was referred to two weeks ago: [place] and [place]. there is improvement in both appearance and measurement to the ulcer. We will extend antibiotic therapy (amoxicillin 500 every 12, Cipro 500 daily) for additional 2 weeks pending infectious disease consult, he will continue with Santyl daily. He was reminded to continue with probiotic therapy while on oral antibiotics; he denies any GI disturbance. 01/03/18-He is here in follow-up evaluation for right great toe ulcer. He is decided to not undergo any vascular intervention at this time. He was established with [facility] infectious disease (Dr. Linus Salmons) last week initiated on vancomycin and cefazolin with dialysis treatment for 6 weeks. We will continue with current treatment plan with Santyl and offloading and he will follow-up in 2 weeks 01/17/18-He is seen in follow-up evaluation for right great toe ulcer. There is improvement in appearance with less nonviable tissue present. We will continue with same treatment plan he will follow-up next week. He is tolerating IV antibiotics without any complications 2/84/13-KG is  seen in follow up evaluation for right great toe ulcer. There continues to be improvement. He is tolerating IV antibiotics with hemodialysis, completion date of 7/31. We will transition to collagen and and follow-up next week 02/07/18-He is here in follow up for a right great toe ulcer. There is red granulation tissue throughout the wound, improved in appearance. He completed antibiotics yesterday. He saw podiatry last Thursday for nail trimming, at that appointment he periwound callus was trimmed and a culture was obtained; culture negative. He does not have a follow up appointment with infectious disease. We will submit for grafix. 02/14/18-He is here in follow-up evaluation for a right great toe ulcer. There is slow improvement, red granulation tissue throughout. The insurance approval for grafix is pending. We will continue with collagen and he'll follow-up next week 02/21/18-He is seen in follow-up evaluation for right great toe ulcer. There is improvement with red granulation tissue throughout. He was approved for oasis and this was placed today. He will follow up next week 02/28/18-He is seen in follow-up evaluation for right great toe ulcer. The wound is stable, oasis applied and he'll follow-up next week 03/07/18-He is seen in follow-up violation of her right great toe ulcer. The wound is dry, no evidence of drainage. The wound appears healed today. He was painted with Betadine, instructed to paint with Betadine daily, cover with band-aid for the next week and then cover with band-aid for the following week continue with surgical shoe. He was encouraged to contact the clinic with any evidence of drainage, or change in appearance to toe/wound. He will be discharged from wound care services Readmission: 07/14/18 on evaluation today patient presents for initial evaluation our clinic concerning issues that he is having with his left great toe. We previously saw him in 2019 for his right great toe which  was actually worse at that time and subsequently was able to be completely healed in the end although it did  take several months. With that being said the patient is currently having an area on his left great toe that occurred initially as result of a blister that came up he's not really sure why or how. Subsequently this was initially treated by his podiatrist though the recommendation there was apparently amputation according to the patient and his daughter who was present with him today. With that being said the patient did not want to proceed with amputation and would like to try to perform wound care and get this to heal without having to go down that road. We were able to do that with his other toe and so he would like to at least give that a try before going forward with amputation. JAKIM, DRAPEAU ([id number]) Again I completely understand his concern in this regard. With that being said I did discuss with the patient obviously that it's always a chance that we cannot get this to heal with normal wound care measures but obviously we will give it our best shot. He does actually have an appointment scheduled for later today with the vascular specialist in order to evaluate his blood flow. That was at the recommendation of his podiatrist as well. Obviously if he is having good arterial flow and nonetheless is still experiencing issues with having the wound delay in healing then it may simply be a matter of we need to initiate more aggressive wound to therapy in order to see if we can get this to heal. No fevers, chills, nausea, or vomiting noted at this time. The patient notes that this woman has been present for approximately three weeks. He is not a smoker. His most recent hemoglobin A1c was 6.2 on 06/18/19. 07/17/2019 on evaluation today patient appears unfortunately to not be doing as well. He did see vascular I did review the note as well. That was on 07/15/2019. Subsequently there can actually  be taking him to the OR for an angiogram due to poor arterial flow into this extremity. Apparently the atherosclerotic changes were severe. The patient is stated to be at risk for limb loss. Nonetheless the good news is this is being scheduled fairly quickly he will have the surgery/procedure next Thursday. With that being said I am going also go ahead based on the results of his x-ray which showed some chance of osteomyelitis in the distal portion of the toe get the patient set up for an MRI to further evaluate for the possibility of osteomyelitis obviously if he does have osteomyelitis we need to know so that we can address this appropriately. 07/29/2019 upon evaluation today patient appears to be doing better in general with regard to his toe ulcer. He does have much better blood flow you can actually feel a palpable pulse which appears to be very strong at this time. I did review his arterial intervention/angiogram which did show that he had evidence of sufficient arterial flow restoration at this point. He did have occlusions that were 60 and 80% that residually were only 10 and 25% with no limiting flow. This is excellent news and hopefully he will continue to show signs of improvement in light of the improved vascular status. With that being said the toe mainly shows somewhat of an eschar today I think that this is fairly stable and as such probably would be best not to really do much in the way of debridement at this point I do believe Betadine would likely be a good option. 08/07/2019 on evaluation today patient actually  appears to be doing about the same. The one difference is the eschar that we were just seeing if we can maintain is actually starting to loosen up and leak around the edges I think it is time to actually go ahead and remove this today. Is also no longer stable is very soft. Nonetheless the patient is okay with proceeding with debridement at this point. Fortunately there is no  signs of active infection. No fevers, chills, nausea, vomiting, or diarrhea. The patient states that he took his last antibiotic that he had last night. He would need a refill as he will not be seeing infectious disease until February 2. Nonetheless obviously I do not want him to have any break in therapy especially when he is doing as well as he is currently. 08/14/2019 on evaluation today patient appears to be doing quite well all things considered in regard to his toe. I do feel like he is making progress and though this is not completely healed by any means we still have some ways to go I feel like he is making progress which is excellent. There is no sign of active infection at this time systemically. He does have osteomyelitis of the distal phalanx of his great toe on the left. He did see Dr. Linus Salmons. That is the infectious disease doctor in [place] where he was referred. Dr. Linus Salmons apparently according to the note did not feel like the patient's MRI revealed osteomyelitis but just reactive marrow and subsequently did not recommend any antibiotic therapy whatsoever. To be peripherally honest I am not really in agreement with this based on the MRI results and what we are seeing and I think he has been responding at least decently well to oral medication as well. Nonetheless we may want to check into the possibility of IV antibiotics being administered at the dialysis center. I will discuss this with Dr. Dellia Nims further. 08/21/2019 upon evaluation today patient appears at this point to be making some progress with regard to his wounds. He has been tolerating the dressing changes without complication. Fortunately there is no evidence of active infection at this time. Overall I feel like he is doing well with the oral antibiotics I discussed with Dr. Dellia Nims the possibility of doing IV antibiotic therapy versus continuing with the oral. His opinion was if the patient is doing well with oral to maybe  stick with that. Obviously we can initiate additional Cipro as well just to help ensure that there is nothing worsening here. The Cipro should be beneficial for him as well. That helps cover more gram-negative's were is at the doxycycline is more gram- positive's. 09/02/2019 on evaluation today patient appears to be doing fairly well upon inspection today. With that being said though the patient is continuing to do well he does also continue to develop issues with buildup of necrotic tissue on the distal portion of his toe he does have good arterial flow however. For that reason I am going to suggest that what we may need to do is consider using something such as Santyl to try to keep this free and clear. Also discussed in greater detail HBO therapy with him today please see plan for additional recommendations and results of this discussion. 09/11/2019 upon evaluation today patient appears to be doing well with regard to his toe ulcer. This actually appears to be much better today using the Santyl than it was previous. Fortunately there is no signs of active infection. No fevers, chills, nausea, vomiting, or  diarrhea. 09/18/2019 upon evaluation today patient appears to be doing much better in regard to his wound in general. The Santyl was doing a good job at loosening stuff up here for [place] which is excellent news. With that being said he is still taking the oral antibiotic. I sent this in last for him on 09/03/2019. This was the doxycycline. When this runs out he will actually be complete as far as the treatment is concerned. With that being said I am getting continue the Cipro for 1 additional month. Overall his wound seems to be doing quite well. 09/25/2019 upon evaluation today patient appears to be doing well with regard to his toe ulcer. This is measuring much better and overall seems to be making good progress. Fortunately there is no signs of active infection at this time. No fevers, chills, nausea,  vomiting, or diarrhea. 10/02/2019 upon evaluation today patient appears to be doing well with regard to his wound. He has been tolerating the dressing changes without complication. Fortunately there is no signs of active infection at this time. No fevers, chills, nausea, vomiting, or diarrhea. Overall I am extremely pleased with how things seem to be progressing. I think the wound bed is at the point now where we can likely proceed with additional measures to try to get this healed more quickly I think Dermagraft may be a good possibility for the patient. We can definitely look into seeing about approval for this. 10/09/2019 upon evaluation today patient's wound actually appears to be doing well although it still is progressing somewhat slowly compared to what I would like to see. With that being said I did order Apligraf for the patient which I felt would keep the wound nice and moist without allowing it to dry out and hopefully allow this to heal much more effectively and quickly. The patient actually did obtain approval and therefore we can apply that today. 10/23/2019 upon evaluation today patient's wound actually appears to be doing significantly better with regard to his toe ulcer. The tissue at the base of the wound is greatly improved and overall I am very pleased with the progress made. We did use Apligraf 2 weeks ago on the toe that did excellent for him much it has completely dissolved after as of the 2 weeks. They only had to change the dressing a few times due to drainage through. Obviously they left the Steri-Strips and Mepitel in place. Unfortunately we do not have the Apligraf available today for application regard therefore have to use a different product here in the clinic today and then order the Apligraf for next Thursday. That will be his second application. 4/22; patient arrived with overhanging skin and subcutaneous tissue on the wound margins this was removed. Apligraf #2  applied POSEY, PETRIK ([id number]) 11/13/2019 upon evaluation today patient actually appears to be doing well with regard to his foot ulcer. He has been tolerating the dressing changes without complication. The overall wound bed appears to be healthier and has filled in as well as not nearly as deep as it was the tissue is also better quality. Overall I am extremely pleased with how things seem to be progressing. The patient is likewise very happy in this regard. 5/20; wound is measuring smaller had Apligraf #3 last time. Apligraf #4 today 12/12/2019 upon evaluation today patient actually appears to be doing quite well with regard to his wound in my opinion. The ulcer is significantly smaller compared to last time I saw him although that  has been several weeks. I do believe the Apligraf has been beneficial he has 1 remaining today this will be application #5. 03/02/2352 upon evaluation today patient appears to be doing well with regard to his toe ulcer. He is measuring about the same today but he has come a long way from where this started. Fortunately there is no evidence of active infection which is great news and overall I am very pleased with the progress that is been made. Nonetheless at this point we have completed the Apligraf course he did see improvement through this but I am going to switch to something different as of today. 01/08/2020 upon evaluation today patient appears to be doing much better in regards to his toe ulcer this is measuring smaller and overall very pleased with the progress. The Hydrofera Blue seems to be doing a great job. Fortunately there is no evidence of active infection at this time. No fevers, chills, nausea, vomiting, or diarrhea. 01/29/2020 upon evaluation today patient appears to be doing okay in regard to his toe ulcer. The main issue I see is that he actually has trouble right now with callus growing and covering over the wound bed. This prevents him from being  able to get a dressing in contact with the wound bed and subsequently fluid collection underneath. I think that we probably need to see him more frequently to keep this pared down and debrided to the point to allow the dressings to effectively heal this wound. I discussed that with the patient today. 02/05/2020 on evaluation today patient appears to be doing well with regard to his wounds currently. In fact he just has 1 on his toe remaining at this point he seems to be doing quite well in my opinion. I am very pleased with the overall appearance today. There is no signs of active infection I do think keeping the callus trimmed down has been of great benefit as far as healing is concerned. I do not think we need to go 3 weeks out like we were in the past I believe that is been somewhat more detrimental than good for him. 02/19/2020 upon evaluation today patient appears to be doing better in regard to his toe ulcer still there is callus buildup around the top of the toe which unfortunately is I think causing some issues here with trapping a little fluid underneath them after cleaning this away. Fortunately there is no evidence of active infection at this time. Overall I feel like the patient is doing quite well which is great news. 03/04/2020 on evaluation today patient appears to be doing well with regard to his toe ulcer though he still has a lot of drying out of the endoform and a lot of callus buildup as well unfortunately. With that being said I think that he is can require some sharp debridement to clear away some of the callus today. We will see what the wound looks like following. 03/11/2020 on evaluation today patient appears to be doing excellent in regard to his toe ulcer. He did have a lot of callus and even some of the nail, growing over top of the area where the wound is trying to heal. For that reason I did actually trim back some of the nail also did go ahead and trim away some of the callus  as well. He tolerated all this without complication the wound appears to be doing well and is showing no signs of infection at this point. Overall I am very  pleased. 03/18/2020 upon evaluation today patient appears to be doing well with regard to his toe ulcer. There does not appear to be any significant buildup of callus at this point and overall I do believe that the alginate has been beneficial. Trimming back the toenail also seems to have helped a lot in this regard I think that was causing some rubbing on the end of the toe. Fortunately overall he is doing much better unfortunately he notes that his grandson has been placed on hospice he is 33 and apparently not doing too well. He wants to make a trip to go see him I really think that something he probably should do and not wait for this to try to heal. 04/01/2020 upon evaluation today patient's toe ulcer appears to be doing about the same. He does have some callus buildup slowly he is making progress but were not completely closed as of yet. I am going to need to perform some debridement today to clear this way. 04/15/2020 on evaluation today patient appears to be doing well in general in regard to his wounds there is no signs of active infection in regard to the toe obviously although he does have a lot of drainage which does raise a bit of a question in that regard. I did discuss with him possibilities for we may want to do treatment wise to try to help out with this. With that being said I Georgina Peer probably start with a topical antibiotic and continuing with the current dressing for the time being. Objective Constitutional Well-nourished and well-hydrated in no acute distress. Vitals Time Taken: 8:17 AM, Height: 76 in, Weight: 244 lbs, BMI: 29.7, Temperature: 98.3 F, Pulse: 74 bpm, Respiratory Rate: 16 breaths/min, Blood Pressure: 203/77 mmHg. General Notes: Dialysis patient, BP fluctuates. Respiratory normal breathing without  difficulty. Psychiatric this patient is able to make decisions and demonstrates good insight into disease process. Alert and Oriented x 3. pleasant and cooperative. JALEEN, FINCH ([id number]) General Notes: Patient's wound bed actually showed signs of good granulation at this time there did not appear to be any evidence of active infection which is great news overall very pleased in that regard. No fevers, chills, nausea, vomiting, or diarrhea. I do think that with the amount of drainage he has we may want to consider a topical antibiotic ointment, gentamicin, to start with and then will see how things do with the current treatment regimen. Integumentary (Hair, Skin) Wound #3 status is Open. Original cause of wound was Gradually Appeared. The wound is located on the Left Toe Great. The wound measures 0.3cm length x 0.1cm width x 0.3cm depth; 0.024cm^2 area and 0.007cm^3 volume. There is Fat Layer (Subcutaneous Tissue) exposed. There is no tunneling or undermining noted. There is a medium amount of serous drainage noted. The wound margin is thickened. There is large (67- 100%) pale granulation within the wound bed. There is a small (1-33%) amount of necrotic tissue within the wound bed including Adherent Slough. 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 There was a Excisional Skin/Subcutaneous Tissue Debridement with a total area of 0.03 sq cm performed by [name], Florestine Carmical E., PA-C. With the  following instrument(s): Curette Material removed includes Callus, Subcutaneous Tissue, and Slough. No specimens were  taken. A time out was conducted at 08:30, prior to the start of the procedure. A Minimum amount of bleeding was controlled with Pressure. The procedure was tolerated well. Post Debridement Measurements: 0.4cm length x 0.2cm width x 0.4cm depth; 0.025cm^3 volume. Character of Wound/Ulcer Post Debridement is stable. Severity of Tissue Post Debridement is: Limited to breakdown of skin. Post procedure Diagnosis Wound #3: Same as Pre-Procedure Plan Wound Cleansing: Wound #3 Left Toe Great: Dial antibacterial soap, wash wounds, rinse and pat dry prior to dressing wounds May Shower, gently pat wound dry prior to applying new dressing. Anesthetic (add to Medication List): Wound #3 Left Toe Great: Topical Lidocaine 4% cream applied to wound bed prior to debridement (In Clinic Only). Skin Barriers/Peri-Wound Care: Wound #3 Left Toe Great: Other: - Gentamycin cream Primary Wound Dressing: Wound #3 Left Toe Great: Silver Alginate Secondary Dressing: Wound #3 Left Toe Great: Dry Gauze Conform/Kerlix Dressing Change Frequency: Wound #3 Left Toe Great: Change dressing every other day. Other: - As needed Follow-up Appointments: Wound #3 Left Toe Great: Return Appointment in 2 weeks. Off-Loading: KORRY, DALGLEISH ([id number]) Wound #3 Left Toe Great: Open toe surgical shoe with peg assist. - left foot The following medication(s) was prescribed: gentamicin topical 0.1 % cream cream topical applied in a thin film to the wound bed the apply dressing every other day as directed starting 04/15/2020 1. I would recommend currently that we add gentamicin in a thin layer to the base of the wound underneath the silver alginate will then continue to pack with a silver alginate dressing over top I think that is the best way to go. 2. I am also can recommend that we have the patient continue to wrap with gauze and roll gauze to secure this pads well as far as the toe is concerned and is using the postop  surgical shoe. We will see patient back for reevaluation in 2 weeks here in the clinic. If anything worsens or changes patient will contact our office for additional recommendations. Electronic Signature(s) Signed: 04/15/2020 8:40:37 AM By: Worthy Keeler PA-C Entered By: Worthy Keeler on 04/15/2020 08:40:36 SIAN, JOLES ([id number]) -------------------------------------------------------------------------------- SuperBill Details Patient Name: Timothy Gibson Date of Service: 04/15/2020 Medical Record Number: [id number] Patient Account Number: [id number] Date of Birth/Sex: 1950-07-22 (69 y.o. M) Treating RN: Grover Canavan Primary Care Provider: Merrie Roof Other Clinician: Referring Provider: Merrie Roof Treating Provider/Extender: Melburn Hake, Janiece Scovill Weeks in Treatment: 39 Diagnosis Coding ICD-10 Codes Code Description [phone] Other chronic osteomyelitis, left ankle and foot E11.621 Type 2 diabetes mellitus with foot ulcer L97.522 Non-pressure chronic ulcer of other part of left foot with fat layer exposed I10 Essential (primary) hypertension Facility Procedures CPT4 Code: 03559741 Description: 11042 - DEB SUBQ TISSUE 20 SQ CM/< Modifier: Quantity: 1 CPT4 Code: Description: ICD-10 Diagnosis Description L97.522 Non-pressure chronic ulcer of other part of left foot with fat layer exp Modifier: osed Quantity: Physician Procedures CPT4 Code: [id number] Description: 99214 - WC PHYS LEVEL 4 - EST PT Modifier: 25 Quantity: 1 CPT4 Code: Description: ICD-10 Diagnosis Description M86.672 Other chronic osteomyelitis, left ankle and foot E11.621 Type 2 diabetes mellitus with foot ulcer L97.522 Non-pressure chronic ulcer of other part of left foot with fat layer exp I10 Essential (primary)  hypertension Modifier: osed Quantity: CPT4 Code: 4680321 Description: 11042 - WC PHYS SUBQ TISS 20 SQ CM Modifier: Quantity: 1 CPT4 Code: Description: ICD-10 Diagnosis Description  P21.624  Non-pressure chronic ulcer of other part of left foot with fat layer exp Modifier: osed Quantity: Electronic Signature(s) Signed: 04/15/2020 8:41:05 AM By: Worthy Keeler PA-C Entered By: Worthy Keeler on 04/15/2020 08:41:04

## 2020-04-16 DIAGNOSIS — N186 End stage renal disease: Secondary | ICD-10-CM | POA: Diagnosis not present

## 2020-04-16 DIAGNOSIS — D509 Iron deficiency anemia, unspecified: Secondary | ICD-10-CM | POA: Diagnosis not present

## 2020-04-16 DIAGNOSIS — Z992 Dependence on renal dialysis: Secondary | ICD-10-CM | POA: Diagnosis not present

## 2020-04-16 DIAGNOSIS — D631 Anemia in chronic kidney disease: Secondary | ICD-10-CM | POA: Diagnosis not present

## 2020-04-16 DIAGNOSIS — N2581 Secondary hyperparathyroidism of renal origin: Secondary | ICD-10-CM | POA: Diagnosis not present

## 2020-04-16 NOTE — Progress Notes (Signed)
Timothy Gibson, Timothy Gibson ([id number]) Visit Report for 04/15/2020 Arrival Information Details Patient Name: Timothy Gibson Date of Service: 04/15/2020 8:15 AM Medical Record Number: [id number] Patient Account Number: [id number] Date of Birth/Sex: March 29, 1951 (69 y.o. M) Treating Gibson: Timothy Gibson Primary Care Timothy Gibson: Timothy Gibson Other Clinician: Referring Timothy Gibson: Timothy Gibson Treating Timothy Gibson/Extender: Timothy Gibson, Timothy Gibson: 38 Visit Information History Since Last Visit Pain Present Now: No Patient Arrived: Cane Arrival Time: 08:16 Accompanied By: self Transfer Assistance: None Patient Identification Verified: Yes Secondary Verification Process Completed: Yes Patient Requires Transmission-Based Precautions: No Patient Has Alerts: No Electronic Signature(s) Signed: 04/15/2020 5:05:57 PM By: Timothy Gibson, BSN, Gibson, CWS, Timothy Gibson, BSN Entered By: Timothy Gibson, BSN, Gibson, CWS, Timothy on 04/15/2020 08:16:48 Timothy Gibson ([id number]) -------------------------------------------------------------------------------- Encounter Discharge Information Details Patient Name: Timothy Gibson Date of Service: 04/15/2020 8:15 AM Medical Record Number: [id number] Patient Account Number: [id number] Date of Birth/Sex: [birth year] (69 y.o. M) Treating Gibson: Timothy Gibson Primary Care Timothy Gibson: Timothy Gibson Other Clinician: Referring Timothy Gibson: Timothy Gibson Treating Timothy Gibson/Extender: Timothy Gibson, Timothy Gibson: 32 Encounter Discharge Information Items Post Procedure Vitals Discharge Condition: Stable Temperature (F): 98.3 Ambulatory Status: Ambulatory Pulse (bpm): 78 Discharge Destination: Home Respiratory Rate (breaths/min): 16 Transportation: Private Auto Blood Pressure (mmHg): 202/77 Accompanied By: self Schedule Follow-up Appointment: Yes Clinical Summary of Care: Notes Dailysis patient. BP fluctuates. Electronic Signature(s) Signed: 04/15/2020 5:05:57 PM By: Timothy Gibson, BSN, Gibson, CWS, Timothy Gibson,  BSN Entered By: Timothy Gibson, BSN, Gibson, CWS, Timothy on 04/15/2020 08:35:19 Timothy Gibson ([id number]) -------------------------------------------------------------------------------- Lower Extremity Assessment Details Patient Name: Timothy Gibson, Timothy Gibson Date of Service: 04/15/2020 8:15 AM Medical Record Number: [id number] Patient Account Number: [id number] Date of Birth/Sex: [birth year] (69 y.o. M) Treating Gibson: Timothy Gibson Primary Care Timothy Gibson: Timothy Gibson Other Clinician: Referring Timothy Gibson: Timothy Gibson Treating Timothy Gibson/Extender: Timothy Gibson, Timothy Gibson: 39 Edema Assessment Assessed: [Left: Yes] [Right: No] [Left: Edema] [Right: :] Vascular Assessment Pulses: Dorsalis Pedis Palpable: [Left:Yes] Electronic Signature(s) Signed: 04/15/2020 5:05:57 PM By: Timothy Gibson, BSN, Gibson, CWS, Timothy Gibson, BSN Entered By: Timothy Gibson, BSN, Gibson, CWS, Timothy on 04/15/2020 08:24:01 Timothy Gibson, Timothy Gibson ([id number]) -------------------------------------------------------------------------------- Multi Wound Chart Details Patient Name: Timothy Gibson Date of Service: 04/15/2020 8:15 AM Medical Record Number: [id number] Patient Account Number: [id number] Date of Birth/Sex: [birth year] (69 y.o. M) Treating Gibson: Timothy Gibson Primary Care Timothy Gibson: Timothy Gibson Other Clinician: Referring Timothy Gibson: Timothy Gibson Treating Timothy Gibson/Extender: Timothy Gibson, Timothy Gibson: 39 Vital Signs Height(in): 76 Pulse(bpm): 74 Weight(lbs): 244 Blood Pressure(mmHg): 203/77 Body Mass Index(BMI): 30 Temperature(F): 98.3 Respiratory Rate(breaths/min): 16 Photos: [N/A:N/A] Wound Location: Left Toe Great N/A N/A Wounding Event: Gradually Appeared N/A N/A Primary Etiology: Diabetic Wound/Ulcer of the Lower N/A N/A Extremity Comorbid History: Lymphedema, Arrhythmia, N/A N/A Hypertension, Type II Diabetes, End Stage Renal Disease, Rheumatoid Arthritis, Osteoarthritis, Confinement Anxiety Date Acquired: 06/19/2019 N/A N/A Gibson of  Gibson: 39 N/A N/A Wound Status: Open N/A N/A Measurements L x W x D (cm) 0.3x0.1x0.3 N/A N/A Area (cm) : 0.024 N/A N/A Volume (cm) : 0.007 N/A N/A % Reduction in Area: 99.10% N/A N/A % Reduction in Volume: 97.40% N/A N/A Classification: Grade 2 N/A N/A Exudate Amount: Medium N/A N/A Exudate Type: Serous N/A N/A Exudate Color: amber N/A N/A Wound Margin: Thickened N/A N/A Granulation Amount: Large (67-100%) N/A N/A Granulation Quality: Pale N/A N/A Necrotic Amount: Small (1-33%) N/A N/A Exposed Structures: Fat Layer (Subcutaneous Tissue): N/A N/A Yes Fascia: No Tendon: No Muscle: No Joint: No Bone: No Epithelialization: Medium (34-66%) N/A  N/A Gibson Notes Electronic Signature(s) Signed: 04/15/2020 5:05:57 PM By: Timothy Gibson, BSN, Gibson, CWS, Timothy Gibson, BSN Entered By: Timothy Gibson, BSN, Gibson, CWS, Timothy on 04/15/2020 08:29:52 Timothy Gibson, Timothy Gibson ([id number]) Timothy Gibson, Timothy Gibson ([id number]) -------------------------------------------------------------------------------- [place] Details Patient Name: Timothy Gibson, Timothy Gibson Date of Service: 04/15/2020 8:15 AM Medical Record Number: [id number] Patient Account Number: [id number] Date of Birth/Sex: 12-04-1950 (69 y.o. M) Treating Gibson: Timothy Gibson Primary Care Timothy Gibson: Timothy Gibson Other Clinician: Referring Timothy Gibson: Timothy Gibson Treating Timothy Gibson/Extender: Timothy Gibson, Timothy Gibson: 80 Active Inactive Necrotic Tissue Nursing Diagnoses: Impaired tissue integrity related to necrotic/devitalized tissue Goals: Necrotic/devitalized tissue will be minimized in the wound bed Date Initiated: 07/15/2019 Target Resolution Date: 10/18/2019 Goal Status: Active Interventions: Provide education on necrotic tissue and debridement process Notes: Peripheral Neuropathy Nursing Diagnoses: Knowledge deficit related to disease process and management of peripheral neurovascular dysfunction Goals: Patient/caregiver will verbalize  understanding of disease process and disease management Date Initiated: 07/15/2019 Target Resolution Date: 10/18/2019 Goal Status: Active Interventions: Assess signs and symptoms of neuropathy upon admission and as needed Provide education on Management of Neuropathy and Related Ulcers Notes: Wound/Skin Impairment Nursing Diagnoses: Impaired tissue integrity Goals: Ulcer/skin breakdown will heal within 14 Gibson Date Initiated: 07/15/2019 Target Resolution Date: 10/18/2019 Goal Status: Active Interventions: Assess patient/caregiver ability to obtain necessary supplies Assess patient/caregiver ability to perform ulcer/skin care regimen upon admission and as needed Assess ulceration(s) every visit Notes: Electronic Signature(s) Signed: 04/15/2020 5:05:57 PM By: Timothy Gibson, BSN, Gibson, CWS, Timothy Gibson, BSN Entered By: Timothy Gibson, BSN, Gibson, CWS, Timothy on 04/15/2020 08:29:40 Timothy Gibson ([id number]) -------------------------------------------------------------------------------- Pain Assessment Details Patient Name: Timothy Gibson Date of Service: 04/15/2020 8:15 AM Medical Record Number: [id number] Patient Account Number: [id number] Date of Birth/Sex: May 11, 1951 (69 y.o. M) Treating Gibson: Timothy Gibson Primary Care Cristan Hout: Timothy Gibson Other Clinician: Referring Taten Merrow: Timothy Gibson Treating Nafis Farnan/Extender: Timothy Gibson, Timothy Gibson: 31 Active Problems Location of Pain Severity and Description of Pain Patient Has Paino No Site Locations Pain Management and Medication Current Pain Management: Notes Patient denies pain at this time. Electronic Signature(s) Signed: 04/15/2020 5:05:57 PM By: Timothy Gibson, BSN, Gibson, CWS, Timothy Gibson, BSN Entered By: Timothy Gibson, BSN, Gibson, CWS, Timothy on 04/15/2020 08:18:45 Timothy Gibson ([id number]) -------------------------------------------------------------------------------- Patient/Caregiver Education Details Patient Name: Timothy Gibson Date of Service:  04/15/2020 8:15 AM Medical Record Number: [id number] Patient Account Number: [id number] Date of Birth/Gender: 12-15-1950 (69 y.o. M) Treating Gibson: Timothy Gibson Primary Care Physician: Timothy Gibson Other Clinician: Referring Physician: Merrie Gibson Treating Physician/Extender: Sharalyn Ink in Gibson: 43 Education Assessment Education Provided To: Patient Education Topics Provided Wound Debridement: Handouts: Wound Debridement Methods: Demonstration, Explain/Verbal Responses: State content correctly Wound/Skin Impairment: Handouts: Caring for Your Ulcer, Other: continue wound care as prescribed Methods: Demonstration, Explain/Verbal Responses: State content correctly Electronic Signature(s) Signed: 04/15/2020 5:05:57 PM By: Timothy Gibson, BSN, Gibson, CWS, Timothy Gibson, BSN Entered By: Timothy Gibson, BSN, Gibson, CWS, Timothy on 04/15/2020 08:33:43 Timothy Gibson ([id number]) -------------------------------------------------------------------------------- Wound Assessment Details Patient Name: Timothy Gibson Date of Service: 04/15/2020 8:15 AM Medical Record Number: [id number] Patient Account Number: [id number] Date of Birth/Sex: [birth year] (69 y.o. M) Treating Gibson: Timothy Gibson Primary Care Farzana Koci: Timothy Gibson Other Clinician: Referring Jonus Coble: Timothy Gibson Treating Joscelyn Hardrick/Extender: Timothy Gibson, Timothy Gibson: 39 Wound Status Wound Number: 3 Primary Diabetic Wound/Ulcer of the Lower Extremity Etiology: Wound Location: Left Toe Great Wound Open Wounding Event: Gradually Appeared Status: Date Acquired: 06/19/2019 Comorbid Lymphedema, Arrhythmia, Hypertension, Type II Gibson Of Gibson: 39  History: Diabetes, End Stage Renal Disease, Rheumatoid Arthritis, Clustered Wound: No Osteoarthritis, Confinement Anxiety Photos Wound Measurements Length: (cm) 0.3 Width: (cm) 0.1 Depth: (cm) 0.3 Area: (cm) 0.024 Volume: (cm) 0.007 % Reduction in Area: 99.1% % Reduction in Volume:  97.4% Epithelialization: Medium (34-66%) Tunneling: No Undermining: No Wound Description Classification: Grade 2 Wound Margin: Thickened Exudate Amount: Medium Exudate Type: Serous Exudate Color: amber Foul Odor After Cleansing: No Slough/Fibrino Yes Wound Bed Granulation Amount: Large (67-100%) Exposed Structure Granulation Quality: Pale Fascia Exposed: No Necrotic Amount: Small (1-33%) Fat Layer (Subcutaneous Tissue) Exposed: Yes Necrotic Quality: Adherent Slough Tendon Exposed: No Muscle Exposed: No Joint Exposed: No Bone Exposed: No Gibson Notes Wound #3 (Left Toe Great) Notes Gentamycin cream, Silver alginate, gauze, conform, offloading shoe Electronic Signature(s) Timothy Gibson, Timothy Gibson ([id number]) Signed: 04/15/2020 5:05:57 PM By: Timothy Gibson, BSN, Gibson, CWS, Timothy Gibson, BSN Entered By: Timothy Gibson, BSN, Gibson, CWS, Timothy on 04/15/2020 08:23:22 Timothy Gibson, Timothy Gibson ([id number]) -------------------------------------------------------------------------------- Vitals Details Patient Name: Timothy Gibson Date of Service: 04/15/2020 8:15 AM Medical Record Number: [id number] Patient Account Number: [id number] Date of Birth/Sex: May 31, 1951 (69 y.o. M) Treating Gibson: Timothy Gibson Primary Care Braedan Meuth: Timothy Gibson Other Clinician: Referring Ellionna Buckbee: Timothy Gibson Treating Kwaku Mostafa/Extender: Timothy Gibson, Timothy Gibson: 62 Vital Signs Time Taken: 08:17 Temperature (F): 98.3 Height (in): 76 Pulse (bpm): 74 Weight (lbs): 244 Respiratory Rate (breaths/min): 16 Body Mass Index (BMI): 29.7 Blood Pressure (mmHg): 203/77 Reference Range: 80 - 120 mg / dl Notes Dialysis patient, BP fluctuates. Electronic Signature(s) Signed: 04/15/2020 5:05:57 PM By: Timothy Gibson, BSN, Gibson, CWS, Timothy Gibson, BSN Entered By: Timothy Gibson, BSN, Gibson, CWS, Timothy on 04/15/2020 08:18:06

## 2020-04-19 DIAGNOSIS — D631 Anemia in chronic kidney disease: Secondary | ICD-10-CM | POA: Diagnosis not present

## 2020-04-19 DIAGNOSIS — N186 End stage renal disease: Secondary | ICD-10-CM | POA: Diagnosis not present

## 2020-04-19 DIAGNOSIS — D509 Iron deficiency anemia, unspecified: Secondary | ICD-10-CM | POA: Diagnosis not present

## 2020-04-19 DIAGNOSIS — N2581 Secondary hyperparathyroidism of renal origin: Secondary | ICD-10-CM | POA: Diagnosis not present

## 2020-04-19 DIAGNOSIS — Z992 Dependence on renal dialysis: Secondary | ICD-10-CM | POA: Diagnosis not present

## 2020-04-19 DIAGNOSIS — E119 Type 2 diabetes mellitus without complications: Secondary | ICD-10-CM | POA: Diagnosis not present

## 2020-04-21 DIAGNOSIS — N186 End stage renal disease: Secondary | ICD-10-CM | POA: Diagnosis not present

## 2020-04-21 DIAGNOSIS — N2581 Secondary hyperparathyroidism of renal origin: Secondary | ICD-10-CM | POA: Diagnosis not present

## 2020-04-21 DIAGNOSIS — D631 Anemia in chronic kidney disease: Secondary | ICD-10-CM | POA: Diagnosis not present

## 2020-04-21 DIAGNOSIS — D509 Iron deficiency anemia, unspecified: Secondary | ICD-10-CM | POA: Diagnosis not present

## 2020-04-21 DIAGNOSIS — Z992 Dependence on renal dialysis: Secondary | ICD-10-CM | POA: Diagnosis not present

## 2020-04-23 DIAGNOSIS — N186 End stage renal disease: Secondary | ICD-10-CM | POA: Diagnosis not present

## 2020-04-23 DIAGNOSIS — D631 Anemia in chronic kidney disease: Secondary | ICD-10-CM | POA: Diagnosis not present

## 2020-04-23 DIAGNOSIS — Z992 Dependence on renal dialysis: Secondary | ICD-10-CM | POA: Diagnosis not present

## 2020-04-23 DIAGNOSIS — D509 Iron deficiency anemia, unspecified: Secondary | ICD-10-CM | POA: Diagnosis not present

## 2020-04-23 DIAGNOSIS — N2581 Secondary hyperparathyroidism of renal origin: Secondary | ICD-10-CM | POA: Diagnosis not present

## 2020-04-26 ENCOUNTER — Other Ambulatory Visit: Payer: Self-pay | Admitting: Family Medicine

## 2020-04-26 DIAGNOSIS — N186 End stage renal disease: Secondary | ICD-10-CM | POA: Diagnosis not present

## 2020-04-26 DIAGNOSIS — D509 Iron deficiency anemia, unspecified: Secondary | ICD-10-CM | POA: Diagnosis not present

## 2020-04-26 DIAGNOSIS — D631 Anemia in chronic kidney disease: Secondary | ICD-10-CM | POA: Diagnosis not present

## 2020-04-26 DIAGNOSIS — Z992 Dependence on renal dialysis: Secondary | ICD-10-CM | POA: Diagnosis not present

## 2020-04-26 DIAGNOSIS — N2581 Secondary hyperparathyroidism of renal origin: Secondary | ICD-10-CM | POA: Diagnosis not present

## 2020-04-26 NOTE — Telephone Encounter (Signed)
Previous RL patient, last seen 10/29/19. Routing to admin for appointment and routing to provider for refill.

## 2020-04-26 NOTE — Telephone Encounter (Signed)
Unable  to lcm to make this apt. Pt hung up on me.

## 2020-04-26 NOTE — Telephone Encounter (Signed)
Last prescribed by Merrie Roof Review for refill   Requested Prescriptions  Pending Prescriptions Disp Refills   TRESIBA FLEXTOUCH 100 UNIT/ML FlexTouch Pen [Pharmacy Med Name: TRESIBA FLEXTOUCH PEN ([id number])[id number]] 6 mL 2    Sig: INJECT 12 UNITS TOTAL INTO THE SKIN AT BEDTIME      Endocrinology:  Diabetes - Insulins Failed - 04/26/2020 11:44 AM      Failed - HBA1C is between 0 and 7.9 and within 180 days    Hemoglobin A1C  Date Value Ref Range Status  10/20/2019 6.0  Final          Passed - Valid encounter within last 6 months    Recent Outpatient Visits           6 months ago End stage renal disease [facility])   [facility] Volney American, PA-C   8 months ago Osteomyelitis of ankle or foot, acute, right [facility])   [facility] [place], [place]   9 months ago Diabetic ulcer of left great toe [facility])   [place], Jolene T, NP   10 months ago Diabetic ulcer of toe of left foot associated with diabetes mellitus due to underlying condition, limited to breakdown of skin [facility])   [facility] Volney American, [place]   1 year ago Skin ulcer of right foot, limited to breakdown of skin [facility])   [place], [place], DO

## 2020-04-26 NOTE — Telephone Encounter (Signed)
TRESIBA FLEXTOUCH 100 UNIT/ML FlexTouch Pen  [id number] - [address], [place] AT [facility] [address] Phone:  [phone]  Fax:  [phone]

## 2020-04-26 NOTE — Telephone Encounter (Signed)
Awaiting review/ recommendation from provider; last seen in office 10/2019

## 2020-04-27 NOTE — Telephone Encounter (Signed)
Lvm to to let Pt know medication is sent in

## 2020-04-27 NOTE — Telephone Encounter (Signed)
Pt scheduled for 05/10/2020,Pt asking if we can send in medication to bridge over due to being completely out of medication.

## 2020-04-28 DIAGNOSIS — D509 Iron deficiency anemia, unspecified: Secondary | ICD-10-CM | POA: Diagnosis not present

## 2020-04-28 DIAGNOSIS — N2581 Secondary hyperparathyroidism of renal origin: Secondary | ICD-10-CM | POA: Diagnosis not present

## 2020-04-28 DIAGNOSIS — Z992 Dependence on renal dialysis: Secondary | ICD-10-CM | POA: Diagnosis not present

## 2020-04-28 DIAGNOSIS — N186 End stage renal disease: Secondary | ICD-10-CM | POA: Diagnosis not present

## 2020-04-28 DIAGNOSIS — D631 Anemia in chronic kidney disease: Secondary | ICD-10-CM | POA: Diagnosis not present

## 2020-04-29 ENCOUNTER — Other Ambulatory Visit: Payer: Self-pay

## 2020-04-29 ENCOUNTER — Encounter: Payer: Medicare Other | Admitting: Physician Assistant

## 2020-04-29 DIAGNOSIS — L97522 Non-pressure chronic ulcer of other part of left foot with fat layer exposed: Secondary | ICD-10-CM | POA: Diagnosis not present

## 2020-04-29 DIAGNOSIS — E11621 Type 2 diabetes mellitus with foot ulcer: Secondary | ICD-10-CM | POA: Diagnosis not present

## 2020-04-29 DIAGNOSIS — M86672 Other chronic osteomyelitis, left ankle and foot: Secondary | ICD-10-CM | POA: Diagnosis not present

## 2020-04-30 ENCOUNTER — Ambulatory Visit: Payer: Medicare Other | Admitting: Family Medicine

## 2020-04-30 DIAGNOSIS — N186 End stage renal disease: Secondary | ICD-10-CM | POA: Diagnosis not present

## 2020-04-30 DIAGNOSIS — D631 Anemia in chronic kidney disease: Secondary | ICD-10-CM | POA: Diagnosis not present

## 2020-04-30 DIAGNOSIS — D509 Iron deficiency anemia, unspecified: Secondary | ICD-10-CM | POA: Diagnosis not present

## 2020-04-30 DIAGNOSIS — N2581 Secondary hyperparathyroidism of renal origin: Secondary | ICD-10-CM | POA: Diagnosis not present

## 2020-04-30 DIAGNOSIS — Z992 Dependence on renal dialysis: Secondary | ICD-10-CM | POA: Diagnosis not present

## 2020-04-30 NOTE — Progress Notes (Signed)
VAL, SCHIAVO ([id number]) Visit Report for 04/29/2020 Chief Complaint Document Details Patient Name: Timothy Gibson, Timothy Gibson Date of Service: 04/29/2020 8:15 AM Medical Record Number: [id number] Patient Account Number: [id number] Date of Birth/Sex: 10/11/1950 (69 y.o. M) Treating RN: Cornell Barman Primary Care Provider: Merrie Roof Other Clinician: Referring Provider: Merrie Roof Treating Provider/Extender: Melburn Hake, Yazir Koerber Weeks in Treatment: 67 Information Obtained from: Patient Chief Complaint Left Great Toe Electronic Signature(s) Signed: 04/29/2020 5:09:06 PM By: Worthy Keeler PA-C Entered By: Worthy Keeler on 04/29/2020 08:23:19 OLUWAFERANMI, WAIN ([id number]) -------------------------------------------------------------------------------- Debridement Details Patient Name: Timothy Gibson Date of Service: 04/29/2020 8:15 AM Medical Record Number: [id number] Patient Account Number: [id number] Date of Birth/Sex: [birth year] (69 y.o. M) Treating RN: Cornell Barman Primary Care Provider: Merrie Roof Other Clinician: Referring Provider: Merrie Roof Treating Provider/Extender: Melburn Hake, Sheppard Luckenbach Weeks in Treatment: 33 Debridement Performed for Wound #3 Left Toe Great Assessment: Performed By: Physician [name], Elleana Stillson E., PA-C Debridement Type: Debridement Severity of Tissue Pre Debridement: Fat layer exposed Level of Consciousness (Pre- Awake and Alert procedure): Pre-procedure Verification/Time Out Yes - 08:50 Taken: Total Area Debrided (L x W): 0.2 (cm) x 0.1 (cm) = 0.02 (cm) Tissue and other material Viable, Non-Viable, Slough, Subcutaneous, Slough debrided: Level: Skin/Subcutaneous Tissue Debridement Description: Excisional Instrument: Curette Bleeding: Minimum Hemostasis Achieved: Pressure Response to Treatment: Procedure was tolerated well Level of Consciousness (Post- Awake and Alert procedure): Post Debridement Measurements of Total Wound Length: (cm)  0.2 Width: (cm) 0.2 Depth: (cm) 0.4 Volume: (cm) 0.013 Character of Wound/Ulcer Post Debridement: Stable Severity of Tissue Post Debridement: Fat layer exposed Post Procedure Diagnosis Same as Pre-procedure Electronic Signature(s) Signed: 04/29/2020 5:09:06 PM By: Worthy Keeler PA-C Signed: 04/29/2020 5:56:15 PM By: Gretta Cool, BSN, RN, CWS, Kim RN, BSN Entered By: Gretta Cool, BSN, RN, CWS, Kim on 04/29/2020 08:51:29 Timothy Gibson, Timothy Gibson ([id number]) -------------------------------------------------------------------------------- HPI Details Patient Name: Timothy Gibson Date of Service: 04/29/2020 8:15 AM Medical Record Number: [id number] Patient Account Number: [id number] Date of Birth/Sex: April 24, 1951 (69 y.o. M) Treating RN: Cornell Barman Primary Care Provider: Merrie Roof Other Clinician: Referring Provider: Merrie Roof Treating Provider/Extender: Melburn Hake, Raymund Manrique Weeks in Treatment: 10 History of Present Illness HPI Description: 11/27/17 on evaluation today patient presents for initial evaluation concerning an issue that he has been having with his right great toe which began last Thursday 11/22/17. He has a history of diabetes mellitus type to which he has had for greater than 30 years currently he is on insulin. Subsequently he also has a history of hypertension. On physical exam inspection is also appears he may have some peripheral vascular disease with his ABI's being noncompressible bilaterally. He is on dialysis as well and this is on Monday, Wednesday, and Friday. Patient was hospitalized back in January/February 2019 although this was more for stomach/back pain though they never told me exactly what was going on. This is according to the patient. Subsequently the ulcer which is between the third and fourth toe when space of the right foot as well is on the plantar aspect of the fourth toe is due to him having cleaned his toes this morning and he states that his finger which is large  split the toe tissue in between causing the wounds that we currently see. With that being said he states this has happened before I explained I would definitely recommend that he not do this any longer. He can use a small soft washcloth to clean between the toes without causing trauma. Subsequently the  right great toe hatchery does show evidence of necrotic tissue present on the surface of the wound specifically there is callous and dead skin surrounding which is trapping fluid causing problems as far as the wound is concerned. The surface of the wound does show slough although due to his vascular flow I'm not going to sharply debride this today I think I will selectively debride away the necrotic skin as well is callous from around the surrounding so this will not continue to be a moisture issue. Nonetheless the patient has Timothy Gibson pain he does have diabetic neuropathy. 12/06/17-He is here in follow-up evaluation for a right great toe ulcer. He is voicing Timothy Gibson complaints or concerns. He continues to infrequently/socially smokes cigars with Timothy Gibson desire to quit. He has been compliant with offloading, using open toed surgical shoe; has been compliant using Santyl daily. He continues on clindamycin although takes it inconsistently secondary to indigestion. The plain film x-ray performed on 5/23 impression: Soft tissue swelling of the left first toe and is noted with probable irregular lucency involving distal tuft of first distal phalanx concerning for osteomyelitis. MRI may be performed for further evaluation; MRI ordered. The vascular evaluation performed on 5/24 field non- compressible ABIs bilaterally and reduced TBI bilaterally suggesting significant tibial disease. He will be referred to vascular medicine for further evaluation. 12/13/17-He is here in follow-up evaluation for right great toe ulcer. He has appointment next Thursday for the MRI and vascular evaluation. Wound culture obtained last week grew  abundant Klebsiella oxytoca (multidrug sensitivity), and abundant enteric coccus faecalis (sensitive to ampicillin). Ampicillin and Cipro were called in, along with a probiotic. In light of his appointments next Thursday he will follow-up in 2 weeks, we will continue with Santyl 12/27/17-He is here for evaluation for a right great toe ulcer. 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 He did have evaluation by vascular medicine, Dr. Ronalee Belts, on 6/13, at that appointment it was decided for him to undergo angiography with possible intervention but he refused and is still considering. If he chooses to have it done he will contact the office. He has not heard back from either ID offices that he was referred to two weeks ago: [place] and [place]. there is improvement in both appearance and measurement to the ulcer. We will extend antibiotic therapy (amoxicillin 500 every 12, Cipro 500 daily) for additional 2 weeks pending infectious disease consult, he will continue with Santyl daily. He was reminded to continue with probiotic therapy while on oral antibiotics; he denies any GI disturbance. 01/03/18-He is here in follow-up evaluation for right great toe ulcer. He is decided to not undergo any vascular intervention at this time. He was established with [facility] infectious disease (Dr. Linus Salmons) last week initiated on vancomycin and cefazolin with dialysis treatment for 6 weeks. We will continue with current treatment plan with Santyl and offloading and he will follow-up in 2 weeks 01/17/18-He is seen in follow-up evaluation for right great toe ulcer. There is improvement in appearance with less nonviable tissue present. We will continue with same treatment plan he will follow-up next week. He is tolerating IV antibiotics without any complications 5/68/12-XN is seen in follow up evaluation for right great toe ulcer. There continues to  be improvement. He is tolerating IV antibiotics with hemodialysis, completion date of 7/31. We will transition to collagen and and follow-up next week 02/07/18-He is here in follow up for a right great  toe ulcer. There is red granulation tissue throughout the wound, improved in appearance. He completed antibiotics yesterday. He saw podiatry last Thursday for nail trimming, at that appointment he periwound callus was trimmed and a culture was obtained; culture negative. He does not have a follow up appointment with infectious disease. We will submit for grafix. 02/14/18-He is here in follow-up evaluation for a right great toe ulcer. There is slow improvement, red granulation tissue throughout. The insurance approval for grafix is pending. We will continue with collagen and he'll follow-up next week 02/21/18-He is seen in follow-up evaluation for right great toe ulcer. There is improvement with red granulation tissue throughout. He was approved for oasis and this was placed today. He will follow up next week 02/28/18-He is seen in follow-up evaluation for right great toe ulcer. The wound is stable, oasis applied and he'll follow-up next week 03/07/18-He is seen in follow-up violation of her right great toe ulcer. The wound is dry, Timothy Gibson evidence of drainage. The wound appears healed today. He was painted with Betadine, instructed to paint with Betadine daily, cover with band-aid for the next week and then cover with band-aid for the following week continue with surgical shoe. He was encouraged to contact the clinic with any evidence of drainage, or change in appearance to toe/wound. He will be discharged from wound care services Readmission: 07/14/18 on evaluation today patient presents for initial evaluation our clinic concerning issues that he is having with his left great toe. We previously saw him in 2019 for his right great toe which was actually worse at that time and subsequently was able to be completely  healed in the end although it did take several months. With that being said the patient is currently having an area on his left great toe that occurred initially as result of a blister that came up he's not really sure why or how. Subsequently this was initially treated by his podiatrist though the recommendation there was apparently amputation according to the patient and his daughter who was present with him today. With that being said the patient did not want to proceed with amputation and would like to try to perform wound care and get this to heal without having to go down that road. We were able to do that with his other toe and so he would like to at least give that a try before going forward with amputation. Again I completely understand his concern in this regard. With that being said I did discuss with the patient obviously that it's always a chance that we cannot get this to heal with normal wound care measures but obviously we will give it our best shot. He does actually have an appointment scheduled for later today with the vascular specialist in order to evaluate his blood flow. That was at the recommendation of his podiatrist as well. Obviously if he is having good arterial flow and nonetheless is still experiencing issues with having the wound delay in healing then it may simply be a matter of we need to initiate more aggressive wound to therapy in order to see if we can get this to heal. Timothy Gibson fevers, chills, JANTZ, MAIN. ([id number]) nausea, or vomiting noted at this time. The patient notes that this woman has been present for approximately three weeks. He is not a smoker. His most recent hemoglobin A1c was 6.2 on 06/18/19. 07/17/2019 on evaluation today patient appears unfortunately to not be doing as well. He did see vascular I did  review the note as well. That was on 07/15/2019. Subsequently there can actually be taking him to the OR for an angiogram due to poor arterial flow into  this extremity. Apparently the atherosclerotic changes were severe. The patient is stated to be at risk for limb loss. Nonetheless the good news is this is being scheduled fairly quickly he will have the surgery/procedure next Thursday. With that being said I am going also go ahead based on the results of his x-ray which showed some chance of osteomyelitis in the distal portion of the toe get the patient set up for an MRI to further evaluate for the possibility of osteomyelitis obviously if he does have osteomyelitis we need to know so that we can address this appropriately. 07/29/2019 upon evaluation today patient appears to be doing better in general with regard to his toe ulcer. He does have much better blood flow you can actually feel a palpable pulse which appears to be very strong at this time. I did review his arterial intervention/angiogram which did show that he had evidence of sufficient arterial flow restoration at this point. He did have occlusions that were 60 and 80% that residually were only 10 and 25% with Timothy Gibson limiting flow. This is excellent news and hopefully he will continue to show signs of improvement in light of the improved vascular status. With that being said the toe mainly shows somewhat of an eschar today I think that this is fairly stable and as such probably would be best not to really do much in the way of debridement at this point I do believe Betadine would likely be a good option. 08/07/2019 on evaluation today patient actually appears to be doing about the same. The one difference is the eschar that we were just seeing if we can maintain is actually starting to loosen up and leak around the edges I think it is time to actually go ahead and remove this today. Is also Timothy Gibson longer stable is very soft. Nonetheless the patient is okay with proceeding with debridement at this point. Fortunately there is Timothy Gibson signs of active infection. Timothy Gibson fevers, chills, nausea, vomiting, or  diarrhea. The patient states that he took his last antibiotic that he had last night. He would need a refill as he will not be seeing infectious disease until February 2. Nonetheless obviously I do not want him to have any break in therapy especially when he is doing as well as he is currently. 08/14/2019 on evaluation today patient appears to be doing quite well all things considered in regard to his toe. I do feel like he is making progress and though this is not completely healed by any means we still have some ways to go I feel like he is making progress which is excellent. There is Timothy Gibson sign of active infection at this time systemically. He does have osteomyelitis of the distal phalanx of his great toe on the left. He did see Dr. Linus Salmons. That is the infectious disease doctor in [place] where he was referred. Dr. Linus Salmons apparently according to the note did not feel like the patient's MRI revealed osteomyelitis but just reactive marrow and subsequently did not recommend any antibiotic therapy whatsoever. To be peripherally honest I am not really in agreement with this based on the MRI results and what we are seeing and I think he has been responding at least decently well to oral medication as well. Nonetheless we may want to check into the possibility of IV antibiotics  being administered at the dialysis center. I will discuss this with Dr. Dellia Nims further. 08/21/2019 upon evaluation today patient appears at this point to be making some progress with regard to his wounds. He has been tolerating the dressing changes without complication. Fortunately there is Timothy Gibson evidence of active infection at this time. Overall I feel like he is doing well with the oral antibiotics I discussed with Dr. Dellia Nims the possibility of doing IV antibiotic therapy versus continuing with the oral. His opinion was if the patient is doing well with oral to maybe stick with that. Obviously we can initiate additional Cipro as well just  to help ensure that there is nothing worsening here. The Cipro should be beneficial for him as well. That helps cover more gram-negative's were is at the doxycycline is more gram- positive's. 09/02/2019 on evaluation today patient appears to be doing fairly well upon inspection today. With that being said though the patient is continuing to do well he does also continue to develop issues with buildup of necrotic tissue on the distal portion of his toe he does have good arterial flow however. For that reason I am going to suggest that what we may need to do is consider using something such as Santyl to try to keep this free and clear. Also discussed in greater detail HBO therapy with him today please see plan for additional recommendations and results of this discussion. 09/11/2019 upon evaluation today patient appears to be doing well with regard to his toe ulcer. This actually appears to be much better today using the Santyl than it was previous. Fortunately there is Timothy Gibson signs of active infection. Timothy Gibson fevers, chills, nausea, vomiting, or diarrhea. 09/18/2019 upon evaluation today patient appears to be doing much better in regard to his wound in general. The Santyl was doing a good job at loosening stuff up here for [place] which is excellent news. With that being said he is still taking the oral antibiotic. I sent this in last for him on 09/03/2019. This was the doxycycline. When this runs out he will actually be complete as far as the treatment is concerned. With that being said I am getting continue the Cipro for 1 additional month. Overall his wound seems to be doing quite well. 09/25/2019 upon evaluation today patient appears to be doing well with regard to his toe ulcer. This is measuring much better and overall seems to be making good progress. Fortunately there is Timothy Gibson signs of active infection at this time. Timothy Gibson fevers, chills, nausea, vomiting, or diarrhea. 10/02/2019 upon evaluation today patient appears to  be doing well with regard to his wound. He has been tolerating the dressing changes without complication. Fortunately there is Timothy Gibson signs of active infection at this time. Timothy Gibson fevers, chills, nausea, vomiting, or diarrhea. Overall I am extremely pleased with how things seem to be progressing. I think the wound bed is at the point now where we can likely proceed with additional measures to try to get this healed more quickly I think Dermagraft may be a good possibility for the patient. We can definitely look into seeing about approval for this. 10/09/2019 upon evaluation today patient's wound actually appears to be doing well although it still is progressing somewhat slowly compared to what I would like to see. With that being said I did order Apligraf for the patient which I felt would keep the wound nice and moist without allowing it to dry out and hopefully allow this to heal much more effectively  and quickly. The patient actually did obtain approval and therefore we can apply that today. 10/23/2019 upon evaluation today patient's wound actually appears to be doing significantly better with regard to his toe ulcer. The tissue at the base of the wound is greatly improved and overall I am very pleased with the progress made. We did use Apligraf 2 weeks ago on the toe that did excellent for him much it has completely dissolved after as of the 2 weeks. They only had to change the dressing a few times due to drainage through. Obviously they left the Steri-Strips and Mepitel in place. Unfortunately we do not have the Apligraf available today for application regard therefore have to use a different product here in the clinic today and then order the Apligraf for next Thursday. That will be his second application. 4/22; patient arrived with overhanging skin and subcutaneous tissue on the wound margins this was removed. Apligraf #2 applied 11/13/2019 upon evaluation today patient actually appears to be doing well  with regard to his foot ulcer. He has been tolerating the dressing changes without complication. The overall wound bed appears to be healthier and has filled in as well as not nearly as deep as it was the tissue is also better quality. Overall I am extremely pleased with how things seem to be progressing. The patient is likewise very happy in this regard. 5/20; wound is measuring smaller had Apligraf #3 last time. Apligraf #4 today Timothy Gibson, Timothy Gibson ([id number]) 12/12/2019 upon evaluation today patient actually appears to be doing quite well with regard to his wound in my opinion. The ulcer is significantly smaller compared to last time I saw him although that has been several weeks. I do believe the Apligraf has been beneficial he has 1 remaining today this will be application #5. 0/99/8338 upon evaluation today patient appears to be doing well with regard to his toe ulcer. He is measuring about the same today but he has come a long way from where this started. Fortunately there is Timothy Gibson evidence of active infection which is great news and overall I am very pleased with the progress that is been made. Nonetheless at this point we have completed the Apligraf course he did see improvement through this but I am going to switch to something different as of today. 01/08/2020 upon evaluation today patient appears to be doing much better in regards to his toe ulcer this is measuring smaller and overall very pleased with the progress. The Hydrofera Blue seems to be doing a great job. Fortunately there is Timothy Gibson evidence of active infection at this time. Timothy Gibson fevers, chills, nausea, vomiting, or diarrhea. 01/29/2020 upon evaluation today patient appears to be doing okay in regard to his toe ulcer. The main issue I see is that he actually has trouble right now with callus growing and covering over the wound bed. This prevents him from being able to get a dressing in contact with the wound bed and subsequently fluid  collection underneath. I think that we probably need to see him more frequently to keep this pared down and debrided to the point to allow the dressings to effectively heal this wound. I discussed that with the patient today. 02/05/2020 on evaluation today patient appears to be doing well with regard to his wounds currently. In fact he just has 1 on his toe remaining at this point he seems to be doing quite well in my opinion. I am very pleased with the overall appearance today. There is  Timothy Gibson signs of active infection I do think keeping the callus trimmed down has been of great benefit as far as healing is concerned. I do not think we need to go 3 weeks out like we were in the past I believe that is been somewhat more detrimental than good for him. 02/19/2020 upon evaluation today patient appears to be doing better in regard to his toe ulcer still there is callus buildup around the top of the toe which unfortunately is I think causing some issues here with trapping a little fluid underneath them after cleaning this away. Fortunately there is Timothy Gibson evidence of active infection at this time. Overall I feel like the patient is doing quite well which is great news. 03/04/2020 on evaluation today patient appears to be doing well with regard to his toe ulcer though he still has a lot of drying out of the endoform and a lot of callus buildup as well unfortunately. With that being said I think that he is can require some sharp debridement to clear away some of the callus today. We will see what the wound looks like following. 03/11/2020 on evaluation today patient appears to be doing excellent in regard to his toe ulcer. He did have a lot of callus and even some of the nail, growing over top of the area where the wound is trying to heal. For that reason I did actually trim back some of the nail also did go ahead and trim away some of the callus as well. He tolerated all this without complication the wound appears to be  doing well and is showing Timothy Gibson signs of infection at this point. Overall I am very pleased. 03/18/2020 upon evaluation today patient appears to be doing well with regard to his toe ulcer. There does not appear to be any significant buildup of callus at this point and overall I do believe that the alginate has been beneficial. Trimming back the toenail also seems to have helped a lot in this regard I think that was causing some rubbing on the end of the toe. Fortunately overall he is doing much better unfortunately he notes that his grandson has been placed on hospice he is 95 and apparently not doing too well. He wants to make a trip to go see him I really think that something he probably should do and not wait for this to try to heal. 04/01/2020 upon evaluation today patient's toe ulcer appears to be doing about the same. He does have some callus buildup slowly he is making progress but were not completely closed as of yet. I am going to need to perform some debridement today to clear this way. 04/15/2020 on evaluation today patient appears to be doing well in general in regard to his wounds there is Timothy Gibson signs of active infection in regard to the toe obviously although he does have a lot of drainage which does raise a bit of a question in that regard. I did discuss with him possibilities for we may want to do treatment wise to try to help out with this. With that being said I Georgina Peer probably start with a topical antibiotic and continuing with the current dressing for the time being. 04/29/2020 on evaluation today patient appears to be doing decently well in regard to his toe ulcer. There is Timothy Gibson signs of active infection at this time. Fortunately I believe that the patient has made some progress since last time I saw him. Still this is taken much longer  than I would like to see obviously the patient is ready for this to be done. Electronic Signature(s) Signed: 04/29/2020 9:42:31 AM By: Worthy Keeler  PA-C Entered By: Worthy Keeler on 04/29/2020 09:42:30 Timothy Gibson, Timothy Gibson ([id number]) -------------------------------------------------------------------------------- Physical Exam Details Patient Name: Timothy Gibson Date of Service: 04/29/2020 8:15 AM Medical Record Number: [id number] Patient Account Number: [id number] Date of Birth/Sex: [birth year] (69 y.o. M) Treating RN: Cornell Barman Primary Care Provider: Merrie Roof Other Clinician: Referring Provider: Merrie Roof Treating Provider/Extender: Melburn Hake, Kerney Hopfensperger Weeks in Treatment: 44 Constitutional Well-nourished and well-hydrated in Timothy Gibson acute distress. Respiratory normal breathing without difficulty. Psychiatric this patient is able to make decisions and demonstrates good insight into disease process. Alert and Oriented x 3. pleasant and cooperative. Notes Wound bed did require some sharp debridement to remove some of the callus from the surface of the wound. This was minimal compared to normal and there was some slough noted as well on the surface of the wound which I did clear away. Fortunately after everything was debrided the wound appears to still be doing better than it was previous. Electronic Signature(s) Signed: 04/29/2020 9:43:01 AM By: Worthy Keeler PA-C Entered By: Worthy Keeler on 04/29/2020 09:43:00 Timothy Gibson ([id number]) -------------------------------------------------------------------------------- Physician Orders Details Patient Name: Timothy Gibson Date of Service: 04/29/2020 8:15 AM Medical Record Number: [id number] Patient Account Number: [id number] Date of Birth/Sex: April 25, 1951 (69 y.o. M) Treating RN: Cornell Barman Primary Care Provider: Merrie Roof Other Clinician: Referring Provider: Merrie Roof Treating Provider/Extender: Melburn Hake, Claris Pech Weeks in Treatment: 64 Verbal / Phone Orders: Timothy Gibson Diagnosis Coding ICD-10 Coding Code Description [phone] Other chronic osteomyelitis, left ankle  and foot E11.621 Type 2 diabetes mellitus with foot ulcer L97.522 Non-pressure chronic ulcer of other part of left foot with fat layer exposed I10 Essential (primary) hypertension Wound Cleansing Wound #3 Left Toe Great o Dial antibacterial soap, wash wounds, rinse and pat dry prior to dressing wounds o May Shower, gently pat wound dry prior to applying new dressing. Anesthetic (add to Medication List) Wound #3 Left Toe Great o Topical Lidocaine 4% cream applied to wound bed prior to debridement (In Clinic Only). Skin Barriers/Peri-Wound Care Wound #3 Left Toe Great o Other: - Gentamycin cream Primary Wound Dressing Wound #3 Left Toe Great o Silver Alginate Secondary Dressing Wound #3 Left Toe Great o Dry Gauze o Conform/Kerlix Dressing Change Frequency Wound #3 Left Toe Great o Change dressing every other day. o Other: - As needed Follow-up Appointments Wound #3 Left Toe Great o Return Appointment in 2 weeks. Off-Loading Wound #3 Left Toe Great o Open toe surgical shoe with peg assist. - left foot Electronic Signature(s) Signed: 04/29/2020 5:09:06 PM By: Worthy Keeler PA-C Signed: 04/29/2020 5:56:15 PM By: Gretta Cool, BSN, RN, CWS, Kim RN, BSN Entered By: Gretta Cool, BSN, RN, CWS, Kim on 04/29/2020 08:52:05 Timothy Gibson, Timothy Gibson ([id number]) Timothy Gibson, Timothy Gibson ([id number]) -------------------------------------------------------------------------------- Problem List Details Patient Name: Timothy Gibson, Timothy Gibson Date of Service: 04/29/2020 8:15 AM Medical Record Number: [id number] Patient Account Number: [id number] Date of Birth/Sex: [birth year] (69 y.o. M) Treating RN: Cornell Barman Primary Care Provider: Merrie Roof Other Clinician: Referring Provider: Merrie Roof Treating Provider/Extender: Melburn Hake, Aspyn Warnke Weeks in Treatment: 50 Active Problems ICD-10 Encounter Code Description Active Date MDM Diagnosis [phone] Other chronic osteomyelitis, left ankle and foot  07/29/2019 Timothy Gibson Yes E11.621 Type 2 diabetes mellitus with foot ulcer 07/15/2019 Timothy Gibson Yes L97.522 Non-pressure chronic ulcer of other part of left foot with fat layer 07/15/2019  Timothy Gibson Yes exposed [place] (primary) hypertension 07/15/2019 Timothy Gibson Yes Inactive Problems Resolved Problems Electronic Signature(s) Signed: 04/29/2020 5:09:06 PM By: Worthy Keeler PA-C Entered By: Worthy Keeler on 04/29/2020 08:23:10 KHIRY, PASQUARIELLO ([id number]) -------------------------------------------------------------------------------- Progress Note Details Patient Name: Timothy Gibson Date of Service: 04/29/2020 8:15 AM Medical Record Number: [id number] Patient Account Number: [id number] Date of Birth/Sex: 1951-01-13 (69 y.o. M) Treating RN: Cornell Barman Primary Care Provider: Merrie Roof Other Clinician: Referring Provider: Merrie Roof Treating Provider/Extender: Melburn Hake, Nessa Ramaker Weeks in Treatment: 16 Subjective Chief Complaint Information obtained from Patient Left Great Toe History of Present Illness (HPI) 11/27/17 on evaluation today patient presents for initial evaluation concerning an issue that he has been having with his right great toe which began last Thursday 11/22/17. He has a history of diabetes mellitus type to which he has had for greater than 30 years currently he is on insulin. Subsequently he also has a history of hypertension. On physical exam inspection is also appears he may have some peripheral vascular disease with his ABI's being noncompressible bilaterally. He is on dialysis as well and this is on Monday, Wednesday, and Friday. Patient was hospitalized back in January/February 2019 although this was more for stomach/back pain though they never told me exactly what was going on. This is according to the patient. Subsequently the ulcer which is between the third and fourth toe when space of the right foot as well is on the plantar aspect of the fourth toe is due to him having cleaned his  toes this morning and he states that his finger which is large split the toe tissue in between causing the wounds that we currently see. With that being said he states this has happened before I explained I would definitely recommend that he not do this any longer. He can use a small soft washcloth to clean between the toes without causing trauma. Subsequently the right great toe hatchery does show evidence of necrotic tissue present on the surface of the wound specifically there is callous and dead skin surrounding which is trapping fluid causing problems as far as the wound is concerned. The surface of the wound does show slough although due to his vascular flow I'm not going to sharply debride this today I think I will selectively debride away the necrotic skin as well is callous from around the surrounding so this will not continue to be a moisture issue. Nonetheless the patient has Timothy Gibson pain he does have diabetic neuropathy. 12/06/17-He is here in follow-up evaluation for a right great toe ulcer. He is voicing Timothy Gibson complaints or concerns. He continues to infrequently/socially smokes cigars with Timothy Gibson desire to quit. He has been compliant with offloading, using open toed surgical shoe; has been compliant using Santyl daily. He continues on clindamycin although takes it inconsistently secondary to indigestion. The plain film x-ray performed on 5/23 impression: Soft tissue swelling of the left first toe and is noted with probable irregular lucency involving distal tuft of first distal phalanx concerning for osteomyelitis. MRI may be performed for further evaluation; MRI ordered. The vascular evaluation performed on 5/24 field non- compressible ABIs bilaterally and reduced TBI bilaterally suggesting significant tibial disease. He will be referred to vascular medicine for further evaluation. 12/13/17-He is here in follow-up evaluation for right great toe ulcer. He has appointment next Thursday for the MRI and  vascular evaluation. Wound culture obtained last week grew abundant Klebsiella oxytoca (multidrug sensitivity), and abundant enteric coccus faecalis (sensitive to ampicillin). Ampicillin  and Cipro were called in, along with a probiotic. In light of his appointments next Thursday he will follow-up in 2 weeks, we will continue with Santyl 12/27/17-He is here for evaluation for a right great toe ulcer. 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 He did have evaluation by vascular medicine, Dr. Ronalee Belts, on 6/13, at that appointment it was decided for him to undergo angiography with possible intervention but he refused and is still considering. If he chooses to have it done he will contact the office. He has not heard back from either ID offices that he was referred to two weeks ago: [place] and [place]. there is improvement in both appearance and measurement to the ulcer. We will extend antibiotic therapy (amoxicillin 500 every 12, Cipro 500 daily) for additional 2 weeks pending infectious disease consult, he will continue with Santyl daily. He was reminded to continue with probiotic therapy while on oral antibiotics; he denies any GI disturbance. 01/03/18-He is here in follow-up evaluation for right great toe ulcer. He is decided to not undergo any vascular intervention at this time. He was established with [facility] infectious disease (Dr. Linus Salmons) last week initiated on vancomycin and cefazolin with dialysis treatment for 6 weeks. We will continue with current treatment plan with Santyl and offloading and he will follow-up in 2 weeks 01/17/18-He is seen in follow-up evaluation for right great toe ulcer. There is improvement in appearance with less nonviable tissue present. We will continue with same treatment plan he will follow-up next week. He is tolerating IV antibiotics without any complications 07/11/70-ZD is seen in follow  up evaluation for right great toe ulcer. There continues to be improvement. He is tolerating IV antibiotics with hemodialysis, completion date of 7/31. We will transition to collagen and and follow-up next week 02/07/18-He is here in follow up for a right great toe ulcer. There is red granulation tissue throughout the wound, improved in appearance. He completed antibiotics yesterday. He saw podiatry last Thursday for nail trimming, at that appointment he periwound callus was trimmed and a culture was obtained; culture negative. He does not have a follow up appointment with infectious disease. We will submit for grafix. 02/14/18-He is here in follow-up evaluation for a right great toe ulcer. There is slow improvement, red granulation tissue throughout. The insurance approval for grafix is pending. We will continue with collagen and he'll follow-up next week 02/21/18-He is seen in follow-up evaluation for right great toe ulcer. There is improvement with red granulation tissue throughout. He was approved for oasis and this was placed today. He will follow up next week 02/28/18-He is seen in follow-up evaluation for right great toe ulcer. The wound is stable, oasis applied and he'll follow-up next week 03/07/18-He is seen in follow-up violation of her right great toe ulcer. The wound is dry, Timothy Gibson evidence of drainage. The wound appears healed today. He was painted with Betadine, instructed to paint with Betadine daily, cover with band-aid for the next week and then cover with band-aid for the following week continue with surgical shoe. He was encouraged to contact the clinic with any evidence of drainage, or change in appearance to toe/wound. He will be discharged from wound care services Readmission: 07/14/18 on evaluation today patient presents for initial evaluation our clinic concerning issues that he is having with his left great toe. We previously saw him in 2019 for his right great toe which was actually  worse  at that time and subsequently was able to be completely healed in the end although it did take several months. With that being said the patient is currently having an area on his left great toe that occurred initially as result of a blister that came up he's not really sure why or how. Subsequently this was initially treated by his podiatrist though the recommendation there was apparently amputation according to the patient and his daughter who was present with him today. With that being said the patient did not want to proceed with amputation and would like to try to perform wound care and get this to heal without having to go down that road. We were able to do that with his other toe and so he would like to at least give that a try before going forward with amputation. JAKWAN, SALLY ([id number]) Again I completely understand his concern in this regard. With that being said I did discuss with the patient obviously that it's always a chance that we cannot get this to heal with normal wound care measures but obviously we will give it our best shot. He does actually have an appointment scheduled for later today with the vascular specialist in order to evaluate his blood flow. That was at the recommendation of his podiatrist as well. Obviously if he is having good arterial flow and nonetheless is still experiencing issues with having the wound delay in healing then it may simply be a matter of we need to initiate more aggressive wound to therapy in order to see if we can get this to heal. Timothy Gibson fevers, chills, nausea, or vomiting noted at this time. The patient notes that this woman has been present for approximately three weeks. He is not a smoker. His most recent hemoglobin A1c was 6.2 on 06/18/19. 07/17/2019 on evaluation today patient appears unfortunately to not be doing as well. He did see vascular I did review the note as well. That was on 07/15/2019. Subsequently there can actually be taking  him to the OR for an angiogram due to poor arterial flow into this extremity. Apparently the atherosclerotic changes were severe. The patient is stated to be at risk for limb loss. Nonetheless the good news is this is being scheduled fairly quickly he will have the surgery/procedure next Thursday. With that being said I am going also go ahead based on the results of his x-ray which showed some chance of osteomyelitis in the distal portion of the toe get the patient set up for an MRI to further evaluate for the possibility of osteomyelitis obviously if he does have osteomyelitis we need to know so that we can address this appropriately. 07/29/2019 upon evaluation today patient appears to be doing better in general with regard to his toe ulcer. He does have much better blood flow you can actually feel a palpable pulse which appears to be very strong at this time. I did review his arterial intervention/angiogram which did show that he had evidence of sufficient arterial flow restoration at this point. He did have occlusions that were 60 and 80% that residually were only 10 and 25% with Timothy Gibson limiting flow. This is excellent news and hopefully he will continue to show signs of improvement in light of the improved vascular status. With that being said the toe mainly shows somewhat of an eschar today I think that this is fairly stable and as such probably would be best not to really do much in the way of debridement at this  point I do believe Betadine would likely be a good option. 08/07/2019 on evaluation today patient actually appears to be doing about the same. The one difference is the eschar that we were just seeing if we can maintain is actually starting to loosen up and leak around the edges I think it is time to actually go ahead and remove this today. Is also Timothy Gibson longer stable is very soft. Nonetheless the patient is okay with proceeding with debridement at this point. Fortunately there is Timothy Gibson signs  of active infection. Timothy Gibson fevers, chills, nausea, vomiting, or diarrhea. The patient states that he took his last antibiotic that he had last night. He would need a refill as he will not be seeing infectious disease until February 2. Nonetheless obviously I do not want him to have any break in therapy especially when he is doing as well as he is currently. 08/14/2019 on evaluation today patient appears to be doing quite well all things considered in regard to his toe. I do feel like he is making progress and though this is not completely healed by any means we still have some ways to go I feel like he is making progress which is excellent. There is Timothy Gibson sign of active infection at this time systemically. He does have osteomyelitis of the distal phalanx of his great toe on the left. He did see Dr. Linus Salmons. That is the infectious disease doctor in [place] where he was referred. Dr. Linus Salmons apparently according to the note did not feel like the patient's MRI revealed osteomyelitis but just reactive marrow and subsequently did not recommend any antibiotic therapy whatsoever. To be peripherally honest I am not really in agreement with this based on the MRI results and what we are seeing and I think he has been responding at least decently well to oral medication as well. Nonetheless we may want to check into the possibility of IV antibiotics being administered at the dialysis center. I will discuss this with Dr. Dellia Nims further. 08/21/2019 upon evaluation today patient appears at this point to be making some progress with regard to his wounds. He has been tolerating the dressing changes without complication. Fortunately there is Timothy Gibson evidence of active infection at this time. Overall I feel like he is doing well with the oral antibiotics I discussed with Dr. Dellia Nims the possibility of doing IV antibiotic therapy versus continuing with the oral. His opinion was if the patient is doing well with oral to maybe stick with  that. Obviously we can initiate additional Cipro as well just to help ensure that there is nothing worsening here. The Cipro should be beneficial for him as well. That helps cover more gram-negative's were is at the doxycycline is more gram- positive's. 09/02/2019 on evaluation today patient appears to be doing fairly well upon inspection today. With that being said though the patient is continuing to do well he does also continue to develop issues with buildup of necrotic tissue on the distal portion of his toe he does have good arterial flow however. For that reason I am going to suggest that what we may need to do is consider using something such as Santyl to try to keep this free and clear. Also discussed in greater detail HBO therapy with him today please see plan for additional recommendations and results of this discussion. 09/11/2019 upon evaluation today patient appears to be doing well with regard to his toe ulcer. This actually appears to be much better today using the Santyl than  it was previous. Fortunately there is Timothy Gibson signs of active infection. Timothy Gibson fevers, chills, nausea, vomiting, or diarrhea. 09/18/2019 upon evaluation today patient appears to be doing much better in regard to his wound in general. The Santyl was doing a good job at loosening stuff up here for [place] which is excellent news. With that being said he is still taking the oral antibiotic. I sent this in last for him on 09/03/2019. This was the doxycycline. When this runs out he will actually be complete as far as the treatment is concerned. With that being said I am getting continue the Cipro for 1 additional month. Overall his wound seems to be doing quite well. 09/25/2019 upon evaluation today patient appears to be doing well with regard to his toe ulcer. This is measuring much better and overall seems to be making good progress. Fortunately there is Timothy Gibson signs of active infection at this time. Timothy Gibson fevers, chills, nausea, vomiting, or  diarrhea. 10/02/2019 upon evaluation today patient appears to be doing well with regard to his wound. He has been tolerating the dressing changes without complication. Fortunately there is Timothy Gibson signs of active infection at this time. Timothy Gibson fevers, chills, nausea, vomiting, or diarrhea. Overall I am extremely pleased with how things seem to be progressing. I think the wound bed is at the point now where we can likely proceed with additional measures to try to get this healed more quickly I think Dermagraft may be a good possibility for the patient. We can definitely look into seeing about approval for this. 10/09/2019 upon evaluation today patient's wound actually appears to be doing well although it still is progressing somewhat slowly compared to what I would like to see. With that being said I did order Apligraf for the patient which I felt would keep the wound nice and moist without allowing it to dry out and hopefully allow this to heal much more effectively and quickly. The patient actually did obtain approval and therefore we can apply that today. 10/23/2019 upon evaluation today patient's wound actually appears to be doing significantly better with regard to his toe ulcer. The tissue at the base of the wound is greatly improved and overall I am very pleased with the progress made. We did use Apligraf 2 weeks ago on the toe that did excellent for him much it has completely dissolved after as of the 2 weeks. They only had to change the dressing a few times due to drainage through. Obviously they left the Steri-Strips and Mepitel in place. Unfortunately we do not have the Apligraf available today for application regard therefore have to use a different product here in the clinic today and then order the Apligraf for next Thursday. That will be his second application. 4/22; patient arrived with overhanging skin and subcutaneous tissue on the wound margins this was removed. Apligraf #2 applied Timothy Gibson, Timothy Gibson ([id number]) 11/13/2019 upon evaluation today patient actually appears to be doing well with regard to his foot ulcer. He has been tolerating the dressing changes without complication. The overall wound bed appears to be healthier and has filled in as well as not nearly as deep as it was the tissue is also better quality. Overall I am extremely pleased with how things seem to be progressing. The patient is likewise very happy in this regard. 5/20; wound is measuring smaller had Apligraf #3 last time. Apligraf #4 today 12/12/2019 upon evaluation today patient actually appears to be doing quite well with regard to his wound  in my opinion. The ulcer is significantly smaller compared to last time I saw him although that has been several weeks. I do believe the Apligraf has been beneficial he has 1 remaining today this will be application #5. 8/34/1962 upon evaluation today patient appears to be doing well with regard to his toe ulcer. He is measuring about the same today but he has come a long way from where this started. Fortunately there is Timothy Gibson evidence of active infection which is great news and overall I am very pleased with the progress that is been made. Nonetheless at this point we have completed the Apligraf course he did see improvement through this but I am going to switch to something different as of today. 01/08/2020 upon evaluation today patient appears to be doing much better in regards to his toe ulcer this is measuring smaller and overall very pleased with the progress. The Hydrofera Blue seems to be doing a great job. Fortunately there is Timothy Gibson evidence of active infection at this time. Timothy Gibson fevers, chills, nausea, vomiting, or diarrhea. 01/29/2020 upon evaluation today patient appears to be doing okay in regard to his toe ulcer. The main issue I see is that he actually has trouble right now with callus growing and covering over the wound bed. This prevents him from being able to get a  dressing in contact with the wound bed and subsequently fluid collection underneath. I think that we probably need to see him more frequently to keep this pared down and debrided to the point to allow the dressings to effectively heal this wound. I discussed that with the patient today. 02/05/2020 on evaluation today patient appears to be doing well with regard to his wounds currently. In fact he just has 1 on his toe remaining at this point he seems to be doing quite well in my opinion. I am very pleased with the overall appearance today. There is Timothy Gibson signs of active infection I do think keeping the callus trimmed down has been of great benefit as far as healing is concerned. I do not think we need to go 3 weeks out like we were in the past I believe that is been somewhat more detrimental than good for him. 02/19/2020 upon evaluation today patient appears to be doing better in regard to his toe ulcer still there is callus buildup around the top of the toe which unfortunately is I think causing some issues here with trapping a little fluid underneath them after cleaning this away. Fortunately there is Timothy Gibson evidence of active infection at this time. Overall I feel like the patient is doing quite well which is great news. 03/04/2020 on evaluation today patient appears to be doing well with regard to his toe ulcer though he still has a lot of drying out of the endoform and a lot of callus buildup as well unfortunately. With that being said I think that he is can require some sharp debridement to clear away some of the callus today. We will see what the wound looks like following. 03/11/2020 on evaluation today patient appears to be doing excellent in regard to his toe ulcer. He did have a lot of callus and even some of the nail, growing over top of the area where the wound is trying to heal. For that reason I did actually trim back some of the nail also did go ahead and trim away some of the callus as well. He  tolerated all this without complication the wound appears to  be doing well and is showing Timothy Gibson signs of infection at this point. Overall I am very pleased. 03/18/2020 upon evaluation today patient appears to be doing well with regard to his toe ulcer. There does not appear to be any significant buildup of callus at this point and overall I do believe that the alginate has been beneficial. Trimming back the toenail also seems to have helped a lot in this regard I think that was causing some rubbing on the end of the toe. Fortunately overall he is doing much better unfortunately he notes that his grandson has been placed on hospice he is 43 and apparently not doing too well. He wants to make a trip to go see him I really think that something he probably should do and not wait for this to try to heal. 04/01/2020 upon evaluation today patient's toe ulcer appears to be doing about the same. He does have some callus buildup slowly he is making progress but were not completely closed as of yet. I am going to need to perform some debridement today to clear this way. 04/15/2020 on evaluation today patient appears to be doing well in general in regard to his wounds there is Timothy Gibson signs of active infection in regard to the toe obviously although he does have a lot of drainage which does raise a bit of a question in that regard. I did discuss with him possibilities for we may want to do treatment wise to try to help out with this. With that being said I Georgina Peer probably start with a topical antibiotic and continuing with the current dressing for the time being. 04/29/2020 on evaluation today patient appears to be doing decently well in regard to his toe ulcer. There is Timothy Gibson signs of active infection at this time. Fortunately I believe that the patient has made some progress since last time I saw him. Still this is taken much longer than I would like to see obviously the patient is ready for this to be  done. Objective Constitutional Well-nourished and well-hydrated in Timothy Gibson acute distress. Vitals Time Taken: 8:33 AM, Height: 76 in, Weight: 244 lbs, BMI: 29.7, Temperature: 98.2 F, Pulse: 75 bpm, Respiratory Rate: 20 breaths/min, Blood Pressure: 187/80 mmHg. Respiratory normal breathing without difficulty. Timothy Gibson, Timothy Gibson ([id number]) Psychiatric this patient is able to make decisions and demonstrates good insight into disease process. Alert and Oriented x 3. pleasant and cooperative. General Notes: Wound bed did require some sharp debridement to remove some of the callus from the surface of the wound. This was minimal compared to normal and there was some slough noted as well on the surface of the wound which I did clear away. Fortunately after everything was debrided the wound appears to still be doing better than it was previous. Integumentary (Hair, Skin) Wound #3 status is Open. Original cause of wound was Gradually Appeared. The wound is located on the Left Toe Great. The wound measures 0.2cm length x 0.1cm width x 0.4cm depth; 0.016cm^2 area and 0.006cm^3 volume. There is Fat Layer (Subcutaneous Tissue) exposed. There is Timothy Gibson tunneling noted, however, there is undermining starting at 9:00 and ending at 4:00 with a maximum distance of 0.2cm. There is a medium amount of serous drainage noted. The wound margin is thickened. There is medium (34-66%) pale granulation within the wound bed. There is a medium (34-66%) amount of necrotic tissue within the wound bed including Adherent Slough. 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 There was a Excisional Skin/Subcutaneous Tissue Debridement with a total area of 0.02 sq cm performed by [name], Tylon Kemmerling E., PA-C. With the following instrument(s): Curette to remove Viable and Non-Viable tissue/material. Material removed includes Subcutaneous Tissue and Slough and. Timothy Gibson specimens were taken. A time out was conducted at 08:50, prior to the start of the procedure. A Minimum amount of bleeding was controlled with Pressure. The procedure was tolerated well. Post Debridement Measurements: 0.2cm length x 0.2cm width x 0.4cm depth; 0.013cm^3 volume. Character of Wound/Ulcer Post Debridement is stable. Severity of Tissue Post Debridement is: Fat layer exposed. Post procedure Diagnosis Wound #3: Same as Pre-Procedure Plan Wound Cleansing: Wound #3 Left Toe Great: Dial antibacterial soap, wash wounds, rinse and pat dry prior to dressing wounds May Shower, gently pat wound dry prior to applying new dressing. Anesthetic (add to Medication List): Wound #3 Left Toe Great: Topical Lidocaine 4% cream applied to wound bed prior to debridement (In Clinic Only). Skin Barriers/Peri-Wound Care: Wound #3 Left Toe Great: Other: - Gentamycin cream Primary Wound Dressing: Wound #3 Left Toe Great: Silver Alginate Secondary Dressing: Wound #3 Left Toe Great: Dry Gauze Conform/Kerlix Dressing Change Frequency: Wound #3 Left Toe Great: Change dressing every other day. Other: - As needed Follow-up Appointments: GEOVONNI, MEYERHOFF ([id number]) Wound #3 Left Toe Great: Return Appointment in 2 weeks. Off-Loading: Wound #3 Left Toe Great: Open toe surgical shoe with peg assist. - left foot 1. I would recommend currently that we going to continue with the wound care measures as before with the gentamicin cream followed by the silver alginate dressing. 2. I am also can recommend at this time that we have the patient continue to monitor for any signs of worsening infection.  Right now I do not see any evidence of this. 3. I am also can recommend the patient continue to avoid excessive walking to try to allow this to heal more effectively and quickly. I still think that is of utmost importance. We will see patient back for reevaluation in 2 weeks here in the clinic. If anything worsens or changes patient will contact our office for additional recommendations. Electronic Signature(s) Signed: 04/29/2020 9:43:41 AM By: Worthy Keeler PA-C Entered By: Worthy Keeler on 04/29/2020 09:43:41 CONO, GEBHARD ([id number]) -------------------------------------------------------------------------------- SuperBill Details Patient Name: Timothy Gibson Date of Service: 04/29/2020 Medical Record Number: [id number] Patient Account Number: [id number] Date of Birth/Sex: Nov 14, 1950 (69 y.o. M) Treating RN: Cornell Barman Primary Care Provider: Merrie Roof Other Clinician: Referring Provider: Merrie Roof Treating Provider/Extender: Melburn Hake, Jamekia Gannett Weeks in Treatment: 19 Diagnosis Coding ICD-10 Codes Code Description [phone] Other chronic osteomyelitis, left ankle and foot E11.621 Type 2 diabetes mellitus with foot ulcer L97.522 Non-pressure chronic ulcer of other part of left foot with fat layer exposed I10 Essential (primary) hypertension Facility Procedures CPT4 Code: [id number] Description: 11042 - DEB SUBQ TISSUE 20 SQ CM/< Modifier: Quantity: 1 CPT4 Code: Description: ICD-10 Diagnosis Description L97.522 Non-pressure chronic ulcer of other part of left foot with fat layer exp Modifier: osed Quantity: Physician Procedures CPT4 Code: [id number] Description: 11042 - WC PHYS SUBQ TISS 20 SQ CM Modifier: Quantity: 1 CPT4 Code: Description: ICD-10 Diagnosis Description L97.522 Non-pressure chronic  ulcer of other part of left foot with fat layer exp Modifier: osed Quantity: Electronic Signature(s) Signed: 04/29/2020 9:43:53 AM By: Worthy Keeler PA-C Entered By:  Worthy Keeler on 04/29/2020 09:43:52

## 2020-05-03 DIAGNOSIS — D509 Iron deficiency anemia, unspecified: Secondary | ICD-10-CM | POA: Diagnosis not present

## 2020-05-03 DIAGNOSIS — Z992 Dependence on renal dialysis: Secondary | ICD-10-CM | POA: Diagnosis not present

## 2020-05-03 DIAGNOSIS — N2581 Secondary hyperparathyroidism of renal origin: Secondary | ICD-10-CM | POA: Diagnosis not present

## 2020-05-03 DIAGNOSIS — D631 Anemia in chronic kidney disease: Secondary | ICD-10-CM | POA: Diagnosis not present

## 2020-05-03 DIAGNOSIS — N186 End stage renal disease: Secondary | ICD-10-CM | POA: Diagnosis not present

## 2020-05-05 DIAGNOSIS — N2581 Secondary hyperparathyroidism of renal origin: Secondary | ICD-10-CM | POA: Diagnosis not present

## 2020-05-05 DIAGNOSIS — Z992 Dependence on renal dialysis: Secondary | ICD-10-CM | POA: Diagnosis not present

## 2020-05-05 DIAGNOSIS — D509 Iron deficiency anemia, unspecified: Secondary | ICD-10-CM | POA: Diagnosis not present

## 2020-05-05 DIAGNOSIS — D631 Anemia in chronic kidney disease: Secondary | ICD-10-CM | POA: Diagnosis not present

## 2020-05-05 DIAGNOSIS — N186 End stage renal disease: Secondary | ICD-10-CM | POA: Diagnosis not present

## 2020-05-07 DIAGNOSIS — Z03818 Encounter for observation for suspected exposure to other biological agents ruled out: Secondary | ICD-10-CM | POA: Diagnosis not present

## 2020-05-07 DIAGNOSIS — R110 Nausea: Secondary | ICD-10-CM | POA: Diagnosis not present

## 2020-05-08 DIAGNOSIS — N2581 Secondary hyperparathyroidism of renal origin: Secondary | ICD-10-CM | POA: Diagnosis not present

## 2020-05-08 DIAGNOSIS — D509 Iron deficiency anemia, unspecified: Secondary | ICD-10-CM | POA: Diagnosis not present

## 2020-05-08 DIAGNOSIS — Z992 Dependence on renal dialysis: Secondary | ICD-10-CM | POA: Diagnosis not present

## 2020-05-08 DIAGNOSIS — N186 End stage renal disease: Secondary | ICD-10-CM | POA: Diagnosis not present

## 2020-05-08 DIAGNOSIS — D631 Anemia in chronic kidney disease: Secondary | ICD-10-CM | POA: Diagnosis not present

## 2020-05-09 DIAGNOSIS — Z992 Dependence on renal dialysis: Secondary | ICD-10-CM | POA: Diagnosis not present

## 2020-05-09 DIAGNOSIS — N186 End stage renal disease: Secondary | ICD-10-CM | POA: Diagnosis not present

## 2020-05-10 ENCOUNTER — Other Ambulatory Visit: Payer: Self-pay

## 2020-05-10 ENCOUNTER — Encounter: Payer: Self-pay | Admitting: Family Medicine

## 2020-05-10 ENCOUNTER — Ambulatory Visit (INDEPENDENT_AMBULATORY_CARE_PROVIDER_SITE_OTHER): Payer: Medicare Other | Admitting: Family Medicine

## 2020-05-10 VITALS — BP 138/74 | HR 89 | Temp 98.8°F | Ht 76.25 in | Wt 257.8 lb

## 2020-05-10 DIAGNOSIS — D509 Iron deficiency anemia, unspecified: Secondary | ICD-10-CM | POA: Diagnosis not present

## 2020-05-10 DIAGNOSIS — D631 Anemia in chronic kidney disease: Secondary | ICD-10-CM | POA: Diagnosis not present

## 2020-05-10 DIAGNOSIS — J189 Pneumonia, unspecified organism: Secondary | ICD-10-CM | POA: Diagnosis not present

## 2020-05-10 DIAGNOSIS — Z992 Dependence on renal dialysis: Secondary | ICD-10-CM

## 2020-05-10 DIAGNOSIS — N186 End stage renal disease: Secondary | ICD-10-CM | POA: Diagnosis not present

## 2020-05-10 DIAGNOSIS — I152 Hypertension secondary to endocrine disorders: Secondary | ICD-10-CM

## 2020-05-10 DIAGNOSIS — I70213 Atherosclerosis of native arteries of extremities with intermittent claudication, bilateral legs: Secondary | ICD-10-CM

## 2020-05-10 DIAGNOSIS — J449 Chronic obstructive pulmonary disease, unspecified: Secondary | ICD-10-CM

## 2020-05-10 DIAGNOSIS — E1159 Type 2 diabetes mellitus with other circulatory complications: Secondary | ICD-10-CM | POA: Diagnosis not present

## 2020-05-10 DIAGNOSIS — E1165 Type 2 diabetes mellitus with hyperglycemia: Secondary | ICD-10-CM | POA: Diagnosis not present

## 2020-05-10 DIAGNOSIS — I5032 Chronic diastolic (congestive) heart failure: Secondary | ICD-10-CM

## 2020-05-10 DIAGNOSIS — Z23 Encounter for immunization: Secondary | ICD-10-CM | POA: Diagnosis not present

## 2020-05-10 DIAGNOSIS — N2581 Secondary hyperparathyroidism of renal origin: Secondary | ICD-10-CM | POA: Diagnosis not present

## 2020-05-10 LAB — BAYER DCA HB A1C WAIVED: HB A1C (BAYER DCA - WAIVED): 5.6 % (ref <7.0)

## 2020-05-10 MED ORDER — TRESIBA FLEXTOUCH 100 UNIT/ML ~~LOC~~ SOPN: PEN_INJECTOR | SUBCUTANEOUS | 3 refills | Status: DC

## 2020-05-10 NOTE — Progress Notes (Signed)
BP 138/74   Pulse 89   Temp 98.8 F (37.1 C) (Oral)   Ht 6' 4.25" (1.937 m)   Wt 257 lb 12.8 oz (116.9 kg)   SpO2 94%   BMI 31.17 kg/m    Subjective:    Patient ID: Timothy Gibson, male    DOB: 1950-08-13, 69 y.o.   MRN: [id number]  HPI: Timothy Gibson is a 69 y.o. male  Chief Complaint  Patient presents with  . Diabetes    needs a refill of Tresiba, taking 12 units every night  . Cough    was given antibiotic on 05/07/20 taking Doxycycline 100mg  BID.   Marland Kitchen Constipation   DIABETES Hypoglycemic episodes:no Polydipsia/polyuria: no Visual disturbance: yes Chest pain: no Paresthesias: no Glucose Monitoring: no  Accucheck frequency: Not Checking Taking Insulin?: yes Blood Pressure Monitoring: not checking Retinal Examination: Up to Date Foot Exam: Up to Date Diabetic Education: Completed Pneumovax: Up to Date Influenza: Up to Date Aspirin: no  HYPERTENSION / HYPERLIPIDEMIA Satisfied with current treatment? yes Duration of hypertension: chronic BP monitoring frequency: not checking BP medication side effects: no Past BP meds: losartan, amlodipine Duration of hyperlipidemia: chronic Cholesterol medication side effects: no Cholesterol supplements: none Past cholesterol medications: atorvastatin Medication compliance: excellent compliance Aspirin: no Recent stressors: no Recurrent headaches: no Visual changes: no Palpitations: no Dyspnea: no Chest pain: no Lower extremity edema: no Dizzy/lightheaded: no   Has had a cough for the past couple of weeks. He has been on doxycycline for the past 3 days. He continues with cough. No fevers. No chills. No SOB. He is otherwise feeling well with no other concerns or complaints at this time.    Relevant past medical, surgical, family and social history reviewed and updated as indicated. Interim medical history since our last visit reviewed. Allergies and medications reviewed and updated.  Review of Systems    Constitutional: Negative.   HENT: Negative.   Respiratory: Positive for cough. Negative for apnea, choking, chest tightness, shortness of breath, wheezing and stridor.   Cardiovascular: Negative.   Gastrointestinal: Negative.   Musculoskeletal: Negative.   Neurological: Negative.   Psychiatric/Behavioral: Negative.     Per HPI unless specifically indicated above     Objective:    BP 138/74   Pulse 89   Temp 98.8 F (37.1 C) (Oral)   Ht 6' 4.25" (1.937 m)   Wt 257 lb 12.8 oz (116.9 kg)   SpO2 94%   BMI 31.17 kg/m   Wt Readings from Last 3 Encounters:  05/10/20 257 lb 12.8 oz (116.9 kg)  10/29/19 254 lb (115.2 kg)  09/17/19 245 lb (111.1 kg)    Physical Exam Vitals and nursing note reviewed.  Constitutional:      General: He is not in acute distress.    Appearance: Normal appearance. He is not ill-appearing, toxic-appearing or diaphoretic.  HENT:     Head: Normocephalic and atraumatic.     Right Ear: External ear normal.     Left Ear: External ear normal.     Nose: Nose normal.     Mouth/Throat:     Mouth: Mucous membranes are moist.     Pharynx: Oropharynx is clear.  Eyes:     General: No scleral icterus.       Right eye: No discharge.        Left eye: No discharge.     Extraocular Movements: Extraocular movements intact.     Conjunctiva/sclera: Conjunctivae normal.     Pupils:  Pupils are equal, round, and reactive to light.  Cardiovascular:     Rate and Rhythm: Normal rate and regular rhythm.     Pulses: Normal pulses.     Heart sounds: Normal heart sounds. No murmur heard.  No friction rub. No gallop.   Pulmonary:     Effort: Pulmonary effort is normal. No respiratory distress.     Breath sounds: No stridor. Rhonchi (LLL) present. No wheezing or rales.  Chest:     Chest wall: No tenderness.  Musculoskeletal:        General: Normal range of motion.     Cervical back: Normal range of motion and neck supple.  Skin:    General: Skin is warm and dry.      Capillary Refill: Capillary refill takes less than 2 seconds.     Coloration: Skin is not jaundiced or pale.     Findings: No bruising, erythema, lesion or rash.  Neurological:     General: No focal deficit present.     Mental Status: He is alert and oriented to person, place, and time. Mental status is at baseline.  Psychiatric:        Mood and Affect: Mood normal.        Behavior: Behavior normal.        Thought Content: Thought content normal.        Judgment: Judgment normal.     Results for orders placed or performed in visit on 05/10/20  CBC with Differential/Platelet  Result Value Ref Range   WBC 3.7 3.4 - 10.8 x10E3/uL   RBC 3.13 (L) 4.14 - 5.80 x10E6/uL   Hemoglobin 9.9 (L) 13.0 - 17.7 g/dL   Hematocrit 30.3 (L) 37.5 - 51.0 %   MCV 97 79 - 97 fL   MCH 31.6 26.6 - 33.0 pg   MCHC 32.7 31 - 35 g/dL   RDW 12.7 11.6 - 15.4 %   Platelets 127 (L) 150 - 450 x10E3/uL   Neutrophils 65 Not Estab. %   Lymphs 22 Not Estab. %   Monocytes 6 Not Estab. %   Eos 6 Not Estab. %   Basos 1 Not Estab. %   Neutrophils Absolute 2.4 1.40 - 7.00 x10E3/uL   Lymphocytes Absolute 0.8 0 - 3 x10E3/uL   Monocytes Absolute 0.2 0 - 0 x10E3/uL   EOS (ABSOLUTE) 0.2 0.0 - 0.4 x10E3/uL   Basophils Absolute 0.0 0 - 0 x10E3/uL   Immature Granulocytes 0 Not Estab. %   Immature Grans (Abs) 0.0 0.0 - 0.1 x10E3/uL  Comprehensive metabolic panel  Result Value Ref Range   Glucose 182 (H) 65 - 99 mg/dL   BUN 23 8 - 27 mg/dL   Creatinine, Ser 7.63 (H) 0.76 - 1.27 mg/dL   GFR calc non Af Amer 7 (L) >59 mL/min/1.73   GFR calc Af Amer 8 (L) >59 mL/min/1.73   BUN/Creatinine Ratio 3 (L) 10 - 24   Sodium 142 134 - 144 mmol/L   Potassium 4.0 3.5 - 5.2 mmol/L   Chloride 97 96 - 106 mmol/L   CO2 31 (H) 20 - 29 mmol/L   Calcium 8.7 8.6 - 10.2 mg/dL   Total Protein 6.6 6.0 - 8.5 g/dL   Albumin 3.7 (L) 3.8 - 4.8 g/dL   Globulin, Total 2.9 1.5 - 4.5 g/dL   Albumin/Globulin Ratio 1.3 1.2 - 2.2   Bilirubin Total  0.3 0.0 - 1.2 mg/dL   Alkaline Phosphatase 94 44 - 121 IU/L   AST  12 0 - 40 IU/L   ALT 8 0 - 44 IU/L  Lipid Panel w/o Chol/HDL Ratio  Result Value Ref Range   Cholesterol, Total 99 (L) 100 - 199 mg/dL   Triglycerides 74 0 - 149 mg/dL   HDL 36 (L) >39 mg/dL   VLDL Cholesterol Cal 16 5 - 40 mg/dL   LDL Chol Calc (NIH) 47 0 - 99 mg/dL  TSH  Result Value Ref Range   TSH 0.686 0.450 - 4.500 uIU/mL  Bayer DCA Hb A1c Waived  Result Value Ref Range   HB A1C (BAYER DCA - WAIVED) 5.6 <7.0 %      Assessment & Plan:   Problem List Items Addressed This Visit      Cardiovascular and Mediastinum   Hypertension associated with diabetes ([place])    Under good control on current regimen. Continue current regimen. Continue to monitor. Call with any concerns. Refills given. Labs drawn today.        Relevant Medications   atorvastatin (LIPITOR) 10 MG tablet   insulin degludec (TRESIBA FLEXTOUCH) 100 UNIT/ML FlexTouch Pen   Chronic diastolic heart failure (H[facility])    Euvolemic today. Continue current regimen. Continue to monitor. Call with any concerns.       Relevant Medications   atorvastatin (LIPITOR) 10 MG tablet     Respiratory   COPD (chronic obstructive pulmonary disease) ([place])    Under good control on current regimen. Continue current regimen. Continue to monitor. Call with any concerns. Refills given. Labs drawn today.         Endocrine   Type 2 diabetes mellitus with hyperglycemia ([place]) - Primary    Under good control with a1c of 5.6. Will cut his insulin to 10 units daily and recheck 1 week. Call with any concerns.       Relevant Medications   atorvastatin (LIPITOR) 10 MG tablet   insulin degludec (TRESIBA FLEXTOUCH) 100 UNIT/ML FlexTouch Pen   Other Relevant Orders   CBC with Differential/Platelet (Completed)   Comprehensive metabolic panel (Completed)   Lipid Panel w/o Chol/HDL Ratio (Completed)   TSH (Completed)   Urinalysis, Routine w reflex microscopic   Bayer DCA Hb  A1c Waived (Completed)     Genitourinary   End stage renal disease (HCC)    Stable. Continue to work with nephrology. Continue dialysis. Call with any concerns.         Other   Dialysis patient ([place])    Stable. Continue to work with nephrology. Continue dialysis. Call with any concerns.        Other Visit Diagnoses    Pneumonia of left lower lobe due to infectious organism       Continue doxycycline. Recheck 1 week to confirm resolution. Call with any concerns.    Relevant Medications   doxycycline (VIBRAMYCIN) 100 MG capsule       Follow up plan: Return next week lung recheck then 3 months DM follow up.

## 2020-05-11 ENCOUNTER — Other Ambulatory Visit: Payer: Self-pay | Admitting: Family Medicine

## 2020-05-11 ENCOUNTER — Encounter: Payer: Self-pay | Admitting: Family Medicine

## 2020-05-11 DIAGNOSIS — D649 Anemia, unspecified: Secondary | ICD-10-CM

## 2020-05-11 DIAGNOSIS — R2681 Unsteadiness on feet: Secondary | ICD-10-CM

## 2020-05-11 LAB — CBC WITH DIFFERENTIAL/PLATELET
Basophils Absolute: 0 10*3/uL (ref 0.0–0.2)
Basos: 1 %
EOS (ABSOLUTE): 0.2 10*3/uL (ref 0.0–0.4)
Eos: 6 %
Hematocrit: 30.3 % — ABNORMAL LOW (ref 37.5–51.0)
Hemoglobin: 9.9 g/dL — ABNORMAL LOW (ref 13.0–17.7)
Immature Grans (Abs): 0 10*3/uL (ref 0.0–0.1)
Immature Granulocytes: 0 %
Lymphocytes Absolute: 0.8 10*3/uL (ref 0.7–3.1)
Lymphs: 22 %
MCH: 31.6 pg (ref 26.6–33.0)
MCHC: 32.7 g/dL (ref 31.5–35.7)
MCV: 97 fL (ref 79–97)
Monocytes Absolute: 0.2 10*3/uL (ref 0.1–0.9)
Monocytes: 6 %
Neutrophils Absolute: 2.4 10*3/uL (ref 1.4–7.0)
Neutrophils: 65 %
Platelets: 127 10*3/uL — ABNORMAL LOW (ref 150–450)
RBC: 3.13 x10E6/uL — ABNORMAL LOW (ref 4.14–5.80)
RDW: 12.7 % (ref 11.6–15.4)
WBC: 3.7 10*3/uL (ref 3.4–10.8)

## 2020-05-11 LAB — COMPREHENSIVE METABOLIC PANEL
ALT: 8 IU/L (ref 0–44)
AST: 12 IU/L (ref 0–40)
Albumin/Globulin Ratio: 1.3 (ref 1.2–2.2)
Albumin: 3.7 g/dL — ABNORMAL LOW (ref 3.8–4.8)
Alkaline Phosphatase: 94 IU/L (ref 44–121)
BUN/Creatinine Ratio: 3 — ABNORMAL LOW (ref 10–24)
BUN: 23 mg/dL (ref 8–27)
Bilirubin Total: 0.3 mg/dL (ref 0.0–1.2)
CO2: 31 mmol/L — ABNORMAL HIGH (ref 20–29)
Calcium: 8.7 mg/dL (ref 8.6–10.2)
Chloride: 97 mmol/L (ref 96–106)
Creatinine, Ser: 7.63 mg/dL — ABNORMAL HIGH (ref 0.76–1.27)
GFR calc Af Amer: 8 mL/min/{1.73_m2} — ABNORMAL LOW (ref >59)
GFR calc non Af Amer: 7 mL/min/{1.73_m2} — ABNORMAL LOW (ref >59)
Globulin, Total: 2.9 g/dL (ref 1.5–4.5)
Glucose: 182 mg/dL — ABNORMAL HIGH (ref 65–99)
Potassium: 4 mmol/L (ref 3.5–5.2)
Sodium: 142 mmol/L (ref 134–144)
Total Protein: 6.6 g/dL (ref 6.0–8.5)

## 2020-05-11 LAB — LIPID PANEL W/O CHOL/HDL RATIO
Cholesterol, Total: 99 mg/dL — ABNORMAL LOW (ref 100–199)
HDL: 36 mg/dL — ABNORMAL LOW (ref >39)
LDL Chol Calc (NIH): 47 mg/dL (ref 0–99)
Triglycerides: 74 mg/dL (ref 0–149)
VLDL Cholesterol Cal: 16 mg/dL (ref 5–40)

## 2020-05-11 LAB — TSH: TSH: 0.686 u[IU]/mL (ref 0.450–4.500)

## 2020-05-11 NOTE — Assessment & Plan Note (Signed)
Euvolemic today. Continue current regimen. Continue to monitor. Call with any concerns.  

## 2020-05-11 NOTE — Assessment & Plan Note (Signed)
Under good control on current regimen. Continue current regimen. Continue to monitor. Call with any concerns. Refills given. Labs drawn today.   

## 2020-05-11 NOTE — Assessment & Plan Note (Signed)
Under good control with a1c of 5.6. Will cut his insulin to 10 units daily and recheck 1 week. Call with any concerns.

## 2020-05-11 NOTE — Assessment & Plan Note (Signed)
Stable. Continue to work with nephrology. Continue dialysis. Call with any concerns.  

## 2020-05-12 DIAGNOSIS — Z992 Dependence on renal dialysis: Secondary | ICD-10-CM | POA: Diagnosis not present

## 2020-05-12 DIAGNOSIS — D631 Anemia in chronic kidney disease: Secondary | ICD-10-CM | POA: Diagnosis not present

## 2020-05-12 DIAGNOSIS — N186 End stage renal disease: Secondary | ICD-10-CM | POA: Diagnosis not present

## 2020-05-12 DIAGNOSIS — D509 Iron deficiency anemia, unspecified: Secondary | ICD-10-CM | POA: Diagnosis not present

## 2020-05-12 DIAGNOSIS — N2581 Secondary hyperparathyroidism of renal origin: Secondary | ICD-10-CM | POA: Diagnosis not present

## 2020-05-12 DIAGNOSIS — Z23 Encounter for immunization: Secondary | ICD-10-CM | POA: Diagnosis not present

## 2020-05-13 ENCOUNTER — Other Ambulatory Visit: Payer: Self-pay

## 2020-05-13 ENCOUNTER — Encounter: Payer: Medicare Other | Attending: Physician Assistant | Admitting: Physician Assistant

## 2020-05-13 DIAGNOSIS — M86672 Other chronic osteomyelitis, left ankle and foot: Secondary | ICD-10-CM | POA: Diagnosis not present

## 2020-05-13 DIAGNOSIS — Z794 Long term (current) use of insulin: Secondary | ICD-10-CM | POA: Diagnosis not present

## 2020-05-13 DIAGNOSIS — L97522 Non-pressure chronic ulcer of other part of left foot with fat layer exposed: Secondary | ICD-10-CM | POA: Insufficient documentation

## 2020-05-13 DIAGNOSIS — E11621 Type 2 diabetes mellitus with foot ulcer: Secondary | ICD-10-CM | POA: Insufficient documentation

## 2020-05-13 NOTE — Progress Notes (Addendum)
Timothy Gibson, Timothy Gibson ([id number]) Visit Report for 05/13/2020 Chief Complaint Document Details Patient Name: Timothy Gibson, Timothy Gibson Date of Service: 05/13/2020 12:30 PM Medical Record Number: [id number] Patient Account Number: [id number] Date of Birth/Sex: 03-24-1951 (69 y.o. M) Treating RN: Cornell Barman Primary Care Provider: Merrie Roof Other Clinician: Referring Provider: Merrie Roof Treating Provider/Extender: Skipper Cliche in Treatment: 40 Information Obtained from: Patient Chief Complaint Left Great Toe Electronic Signature(s) Signed: 05/13/2020 12:49:27 PM By: Worthy Keeler PA-C Entered By: Worthy Keeler on 05/13/2020 12:49:26 Timothy Gibson, Timothy Gibson ([id number]) -------------------------------------------------------------------------------- Debridement Details Patient Name: Timothy Gibson Date of Service: 05/13/2020 12:30 PM Medical Record Number: [id number] Patient Account Number: [id number] Date of Birth/Sex: [birth year] (69 y.o. M) Treating RN: Dolan Amen Primary Care Provider: Merrie Roof Other Clinician: Referring Provider: Merrie Roof Treating Provider/Extender: Skipper Cliche in Treatment: 43 Debridement Performed for Wound #3 Left Toe Great Assessment: Performed By: Physician Tommie Sams., PA-C Debridement Type: Debridement Severity of Tissue Pre Debridement: Fat layer exposed Level of Consciousness (Pre- Awake and Alert procedure): Pre-procedure Verification/Time Out Yes - 12:55 Taken: Start Time: 12:55 Total Area Debrided (L x W): 0.2 (cm) x 0.4 (cm) = 0.08 (cm) Tissue and other material Viable, Non-Viable, Callus, Subcutaneous debrided: Level: Skin/Subcutaneous Tissue Debridement Description: Excisional Instrument: Curette Bleeding: Minimum Hemostasis Achieved: Pressure End Time: 12:57 Procedural Pain: Insensate Post Procedural Pain: Insensate Response to Treatment: Procedure was tolerated well Level of Consciousness (Post- Awake and  Alert procedure): Post Debridement Measurements of Total Wound Length: (cm) 0.2 Width: (cm) 0.4 Depth: (cm) 0.3 Volume: (cm) 0.019 Character of Wound/Ulcer Post Debridement: Improved Severity of Tissue Post Debridement: Fat layer exposed Post Procedure Diagnosis Same as Pre-procedure Electronic Signature(s) Signed: 05/13/2020 3:12:24 PM By: Charlett Nose Signed: 05/13/2020 5:01:49 PM By: Worthy Keeler PA-C Entered By: Georges Mouse, Minus Breeding on 05/13/2020 13:00:18 Timothy Gibson ([id number]) -------------------------------------------------------------------------------- HPI Details Patient Name: Timothy Gibson Date of Service: 05/13/2020 12:30 PM Medical Record Number: [id number] Patient Account Number: [id number] Date of Birth/Sex: Dec 16, 1950 (69 y.o. M) Treating RN: Cornell Barman Primary Care Provider: Merrie Roof Other Clinician: Referring Provider: Merrie Roof Treating Provider/Extender: Skipper Cliche in Treatment: 94 History of Present Illness HPI Description: 11/27/17 on evaluation today patient presents for initial evaluation concerning an issue that he has been having with his right great toe which began last Thursday 11/22/17. He has a history of diabetes mellitus type to which he has had for greater than 30 years currently he is on insulin. Subsequently he also has a history of hypertension. On physical exam inspection is also appears he may have some peripheral vascular disease with his ABI's being noncompressible bilaterally. He is on dialysis as well and this is on Monday, Wednesday, and Friday. Patient was hospitalized back in January/February 2019 although this was more for stomach/back pain though they never told me exactly what was going on. This is according to the patient. Subsequently the ulcer which is between the third and fourth toe when space of the right foot as well is on the plantar aspect of the fourth toe is due to him having cleaned his  toes this morning and he states that his finger which is large split the toe tissue in between causing the wounds that we currently see. With that being said he states this has happened before I explained I would definitely recommend that he not do this any longer. He can use a small soft washcloth to clean between the toes without causing trauma.  Subsequently the right great toe hatchery does show evidence of necrotic tissue present on the surface of the wound specifically there is callous and dead skin surrounding which is trapping fluid causing problems as far as the wound is concerned. The surface of the wound does show slough although due to his vascular flow I'm not going to sharply debride this today I think I will selectively debride away the necrotic skin as well is callous from around the surrounding so this will not continue to be a moisture issue. Nonetheless the patient has no pain he does have diabetic neuropathy. 12/06/17-He is here in follow-up evaluation for a right great toe ulcer. He is voicing no complaints or concerns. He continues to infrequently/socially smokes cigars with no desire to quit. He has been compliant with offloading, using open toed surgical shoe; has been compliant using Santyl Timothy Gibson. He continues on clindamycin although takes it inconsistently secondary to indigestion. The plain film x-ray performed on 5/23 impression: Soft tissue swelling of the left first toe and is noted with probable irregular lucency involving distal tuft of first distal phalanx concerning for osteomyelitis. MRI may be performed for further evaluation; MRI ordered. The vascular evaluation performed on 5/24 field non- compressible ABIs bilaterally and reduced TBI bilaterally suggesting significant tibial disease. He will be referred to vascular medicine for further evaluation. 12/13/17-He is here in follow-up evaluation for right great toe ulcer. He has appointment next Thursday for the MRI and  vascular evaluation. Wound culture obtained last week grew abundant Klebsiella oxytoca (multidrug sensitivity), and abundant enteric coccus faecalis (sensitive to ampicillin). Ampicillin and Cipro were called in, along with a probiotic. In light of his appointments next Thursday he will follow-up in 2 weeks, we will continue with Santyl 12/27/17-He is here for evaluation for a right great toe ulcer. 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 He did have evaluation by vascular medicine, Dr. Ronalee Belts, on 6/13, at that appointment it was decided for him to undergo angiography with possible intervention but he refused and is still considering. If he chooses to have it done he will contact the office. He has not heard back from either ID offices that he was referred to two weeks ago: [place] and [place]. there is improvement in both appearance and measurement to the ulcer. We will extend antibiotic therapy (amoxicillin 500 every 12, Cipro 500 Timothy Gibson) for additional 2 weeks pending infectious disease consult, he will continue with Santyl Timothy Gibson. He was reminded to continue with probiotic therapy while on oral antibiotics; he denies any GI disturbance. 01/03/18-He is here in follow-up evaluation for right great toe ulcer. He is decided to not undergo any vascular intervention at this time. He was established with [facility] infectious disease (Dr. Linus Salmons) last week initiated on vancomycin and cefazolin with dialysis treatment for 6 weeks. We will continue with current treatment plan with Santyl and offloading and he will follow-up in 2 weeks 01/17/18-He is seen in follow-up evaluation for right great toe ulcer. There is improvement in appearance with less nonviable tissue present. We will continue with same treatment plan he will follow-up next week. He is tolerating IV antibiotics without any complications 1/61/09-UE is seen in follow  up evaluation for right great toe ulcer. There continues to be improvement. He is tolerating IV antibiotics with hemodialysis, completion date of 7/31. We will transition to collagen and and follow-up next week 02/07/18-He is here in follow up for a  right great toe ulcer. There is red granulation tissue throughout the wound, improved in appearance. He completed antibiotics yesterday. He saw podiatry last Thursday for nail trimming, at that appointment he periwound callus was trimmed and a culture was obtained; culture negative. He does not have a follow up appointment with infectious disease. We will submit for grafix. 02/14/18-He is here in follow-up evaluation for a right great toe ulcer. There is slow improvement, red granulation tissue throughout. The insurance approval for grafix is pending. We will continue with collagen and he'll follow-up next week 02/21/18-He is seen in follow-up evaluation for right great toe ulcer. There is improvement with red granulation tissue throughout. He was approved for oasis and this was placed today. He will follow up next week 02/28/18-He is seen in follow-up evaluation for right great toe ulcer. The wound is stable, oasis applied and he'll follow-up next week 03/07/18-He is seen in follow-up violation of her right great toe ulcer. The wound is dry, no evidence of drainage. The wound appears healed today. He was painted with Betadine, instructed to paint with Betadine Timothy Gibson, cover with band-aid for the next week and then cover with band-aid for the following week continue with surgical shoe. He was encouraged to contact the clinic with any evidence of drainage, or change in appearance to toe/wound. He will be discharged from wound care services Readmission: 07/14/18 on evaluation today patient presents for initial evaluation our clinic concerning issues that he is having with his left great toe. We previously saw him in 2019 for his right great toe which was actually  worse at that time and subsequently was able to be completely healed in the end although it did take several months. With that being said the patient is currently having an area on his left great toe that occurred initially as result of a blister that came up he's not really sure why or how. Subsequently this was initially treated by his podiatrist though the recommendation there was apparently amputation according to the patient and his daughter who was present with him today. With that being said the patient did not want to proceed with amputation and would like to try to perform wound care and get this to heal without having to go down that road. We were able to do that with his other toe and so he would like to at least give that a try before going forward with amputation. Again I completely understand his concern in this regard. With that being said I did discuss with the patient obviously that it's always a chance that we cannot get this to heal with normal wound care measures but obviously we will give it our best shot. He does actually have an appointment scheduled for later today with the vascular specialist in order to evaluate his blood flow. That was at the recommendation of his podiatrist as well. Obviously if he is having good arterial flow and nonetheless is still experiencing issues with having the wound delay in healing then it may simply be a matter of we need to initiate more aggressive wound to therapy in order to see if we can get this to heal. No fevers, chills, [place], [place]. ([id number]) nausea, or vomiting noted at this time. The patient notes that this woman has been present for approximately three weeks. He is not a smoker. His most recent hemoglobin A1c was 6.2 on 06/18/19. 07/17/2019 on evaluation today patient appears unfortunately to not be doing as well. He did see vascular  I did review the note as well. That was on 07/15/2019. Subsequently there can actually be taking  him to the OR for an angiogram due to poor arterial flow into this extremity. Apparently the atherosclerotic changes were severe. The patient is stated to be at risk for limb loss. Nonetheless the good news is this is being scheduled fairly quickly he will have the surgery/procedure next Thursday. With that being said I am going also go ahead based on the results of his x-ray which showed some chance of osteomyelitis in the distal portion of the toe get the patient set up for an MRI to further evaluate for the possibility of osteomyelitis obviously if he does have osteomyelitis we need to know so that we can address this appropriately. 07/29/2019 upon evaluation today patient appears to be doing better in general with regard to his toe ulcer. He does have much better blood flow you can actually feel a palpable pulse which appears to be very strong at this time. I did review his arterial intervention/angiogram which did show that he had evidence of sufficient arterial flow restoration at this point. He did have occlusions that were 60 and 80% that residually were only 10 and 25% with no limiting flow. This is excellent news and hopefully he will continue to show signs of improvement in light of the improved vascular status. With that being said the toe mainly shows somewhat of an eschar today I think that this is fairly stable and as such probably would be best not to really do much in the way of debridement at this point I do believe Betadine would likely be a good option. 08/07/2019 on evaluation today patient actually appears to be doing about the same. The one difference is the eschar that we were just seeing if we can maintain is actually starting to loosen up and leak around the edges I think it is time to actually go ahead and remove this today. Is also no longer stable is very soft. Nonetheless the patient is okay with proceeding with debridement at this point. Fortunately there is no signs  of active infection. No fevers, chills, nausea, vomiting, or diarrhea. The patient states that he took his last antibiotic that he had last night. He would need a refill as he will not be seeing infectious disease until February 2. Nonetheless obviously I do not want him to have any break in therapy especially when he is doing as well as he is currently. 08/14/2019 on evaluation today patient appears to be doing quite well all things considered in regard to his toe. I do feel like he is making progress and though this is not completely healed by any means we still have some ways to go I feel like he is making progress which is excellent. There is no sign of active infection at this time systemically. He does have osteomyelitis of the distal phalanx of his great toe on the left. He did see Dr. Linus Salmons. That is the infectious disease doctor in [place] where he was referred. Dr. Linus Salmons apparently according to the note did not feel like the patient's MRI revealed osteomyelitis but just reactive marrow and subsequently did not recommend any antibiotic therapy whatsoever. To be peripherally honest I am not really in agreement with this based on the MRI results and what we are seeing and I think he has been responding at least decently well to oral medication as well. Nonetheless we may want to check into the possibility of  IV antibiotics being administered at the dialysis center. I will discuss this with Dr. Dellia Nims further. 08/21/2019 upon evaluation today patient appears at this point to be making some progress with regard to his wounds. He has been tolerating the dressing changes without complication. Fortunately there is no evidence of active infection at this time. Overall I feel like he is doing well with the oral antibiotics I discussed with Dr. Dellia Nims the possibility of doing IV antibiotic therapy versus continuing with the oral. His opinion was if the patient is doing well with oral to maybe stick with  that. Obviously we can initiate additional Cipro as well just to help ensure that there is nothing worsening here. The Cipro should be beneficial for him as well. That helps cover more gram-negative's were is at the doxycycline is more gram- positive's. 09/02/2019 on evaluation today patient appears to be doing fairly well upon inspection today. With that being said though the patient is continuing to do well he does also continue to develop issues with buildup of necrotic tissue on the distal portion of his toe he does have good arterial flow however. For that reason I am going to suggest that what we may need to do is consider using something such as Santyl to try to keep this free and clear. Also discussed in greater detail HBO therapy with him today please see plan for additional recommendations and results of this discussion. 09/11/2019 upon evaluation today patient appears to be doing well with regard to his toe ulcer. This actually appears to be much better today using the Santyl than it was previous. Fortunately there is no signs of active infection. No fevers, chills, nausea, vomiting, or diarrhea. 09/18/2019 upon evaluation today patient appears to be doing much better in regard to his wound in general. The Santyl was doing a good job at loosening stuff up here for [place] which is excellent news. With that being said he is still taking the oral antibiotic. I sent this in last for him on 09/03/2019. This was the doxycycline. When this runs out he will actually be complete as far as the treatment is concerned. With that being said I am getting continue the Cipro for 1 additional month. Overall his wound seems to be doing quite well. 09/25/2019 upon evaluation today patient appears to be doing well with regard to his toe ulcer. This is measuring much better and overall seems to be making good progress. Fortunately there is no signs of active infection at this time. No fevers, chills, nausea, vomiting, or  diarrhea. 10/02/2019 upon evaluation today patient appears to be doing well with regard to his wound. He has been tolerating the dressing changes without complication. Fortunately there is no signs of active infection at this time. No fevers, chills, nausea, vomiting, or diarrhea. Overall I am extremely pleased with how things seem to be progressing. I think the wound bed is at the point now where we can likely proceed with additional measures to try to get this healed more quickly I think Dermagraft may be a good possibility for the patient. We can definitely look into seeing about approval for this. 10/09/2019 upon evaluation today patient's wound actually appears to be doing well although it still is progressing somewhat slowly compared to what I would like to see. With that being said I did order Apligraf for the patient which I felt would keep the wound nice and moist without allowing it to dry out and hopefully allow this to heal much  more effectively and quickly. The patient actually did obtain approval and therefore we can apply that today. 10/23/2019 upon evaluation today patient's wound actually appears to be doing significantly better with regard to his toe ulcer. The tissue at the base of the wound is greatly improved and overall I am very pleased with the progress made. We did use Apligraf 2 weeks ago on the toe that did excellent for him much it has completely dissolved after as of the 2 weeks. They only had to change the dressing a few times due to drainage through. Obviously they left the Steri-Strips and Mepitel in place. Unfortunately we do not have the Apligraf available today for application regard therefore have to use a different product here in the clinic today and then order the Apligraf for next Thursday. That will be his second application. 4/22; patient arrived with overhanging skin and subcutaneous tissue on the wound margins this was removed. Apligraf #2 applied 11/13/2019 upon  evaluation today patient actually appears to be doing well with regard to his foot ulcer. He has been tolerating the dressing changes without complication. The overall wound bed appears to be healthier and has filled in as well as not nearly as deep as it was the tissue is also better quality. Overall I am extremely pleased with how things seem to be progressing. The patient is likewise very happy in this regard. 5/20; wound is measuring smaller had Apligraf #3 last time. Apligraf #4 today Timothy Gibson, Timothy Gibson ([id number]) 12/12/2019 upon evaluation today patient actually appears to be doing quite well with regard to his wound in my opinion. The ulcer is significantly smaller compared to last time I saw him although that has been several weeks. I do believe the Apligraf has been beneficial he has 1 remaining today this will be application #5. 11/05/7679 upon evaluation today patient appears to be doing well with regard to his toe ulcer. He is measuring about the same today but he has come a long way from where this started. Fortunately there is no evidence of active infection which is great news and overall I am very pleased with the progress that is been made. Nonetheless at this point we have completed the Apligraf course he did see improvement through this but I am going to switch to something different as of today. 01/08/2020 upon evaluation today patient appears to be doing much better in regards to his toe ulcer this is measuring smaller and overall very pleased with the progress. The Hydrofera Blue seems to be doing a great job. Fortunately there is no evidence of active infection at this time. No fevers, chills, nausea, vomiting, or diarrhea. 01/29/2020 upon evaluation today patient appears to be doing okay in regard to his toe ulcer. The main issue I see is that he actually has trouble right now with callus growing and covering over the wound bed. This prevents him from being able to get a dressing  in contact with the wound bed and subsequently fluid collection underneath. I think that we probably need to see him more frequently to keep this pared down and debrided to the point to allow the dressings to effectively heal this wound. I discussed that with the patient today. 02/05/2020 on evaluation today patient appears to be doing well with regard to his wounds currently. In fact he just has 1 on his toe remaining at this point he seems to be doing quite well in my opinion. I am very pleased with the overall appearance today.  There is no signs of active infection I do think keeping the callus trimmed down has been of great benefit as far as healing is concerned. I do not think we need to go 3 weeks out like we were in the past I believe that is been somewhat more detrimental than good for him. 02/19/2020 upon evaluation today patient appears to be doing better in regard to his toe ulcer still there is callus buildup around the top of the toe which unfortunately is I think causing some issues here with trapping a little fluid underneath them after cleaning this away. Fortunately there is no evidence of active infection at this time. Overall I feel like the patient is doing quite well which is great news. 03/04/2020 on evaluation today patient appears to be doing well with regard to his toe ulcer though he still has a lot of drying out of the endoform and a lot of callus buildup as well unfortunately. With that being said I think that he is can require some sharp debridement to clear away some of the callus today. We will see what the wound looks like following. 03/11/2020 on evaluation today patient appears to be doing excellent in regard to his toe ulcer. He did have a lot of callus and even some of the nail, growing over top of the area where the wound is trying to heal. For that reason I did actually trim back some of the nail also did go ahead and trim away some of the callus as well. He tolerated  all this without complication the wound appears to be doing well and is showing no signs of infection at this point. Overall I am very pleased. 03/18/2020 upon evaluation today patient appears to be doing well with regard to his toe ulcer. There does not appear to be any significant buildup of callus at this point and overall I do believe that the alginate has been beneficial. Trimming back the toenail also seems to have helped a lot in this regard I think that was causing some rubbing on the end of the toe. Fortunately overall he is doing much better unfortunately he notes that his grandson has been placed on hospice he is 12 and apparently not doing too well. He wants to make a trip to go see him I really think that something he probably should do and not wait for this to try to heal. 04/01/2020 upon evaluation today patient's toe ulcer appears to be doing about the same. He does have some callus buildup slowly he is making progress but were not completely closed as of yet. I am going to need to perform some debridement today to clear this way. 04/15/2020 on evaluation today patient appears to be doing well in general in regard to his wounds there is no signs of active infection in regard to the toe obviously although he does have a lot of drainage which does raise a bit of a question in that regard. I did discuss with him possibilities for we may want to do treatment wise to try to help out with this. With that being said I Georgina Peer probably start with a topical antibiotic and continuing with the current dressing for the time being. 04/29/2020 on evaluation today patient appears to be doing decently well in regard to his toe ulcer. There is no signs of active infection at this time. Fortunately I believe that the patient has made some progress since last time I saw him. Still this is taken  much longer than I would like to see obviously the patient is ready for this to be done. 05/13/2020 upon evaluation  today patient appears to be showing some signs of improvement with regard to his toe ulcer this is great news and overall very pleased in that regard. There is no evidence of active infection currently which is great news as well. No fevers, chills, nausea, vomiting, or diarrhea. Electronic Signature(s) Signed: 05/13/2020 4:43:49 PM By: Worthy Keeler PA-C Entered By: Worthy Keeler on 05/13/2020 16:43:49 Timothy Gibson, Timothy Gibson ([id number]) -------------------------------------------------------------------------------- Physical Exam Details Patient Name: Timothy Gibson Date of Service: 05/13/2020 12:30 PM Medical Record Number: [id number] Patient Account Number: [id number] Date of Birth/Sex: 08-Jun-1951 (69 y.o. M) Treating RN: Cornell Barman Primary Care Provider: Merrie Roof Other Clinician: Referring Provider: Merrie Roof Treating Provider/Extender: Skipper Cliche in Treatment: 86 Constitutional Well-nourished and well-hydrated in no acute distress. Respiratory normal breathing without difficulty. Psychiatric this patient is able to make decisions and demonstrates good insight into disease process. Alert and Oriented x 3. pleasant and cooperative. Notes Patient's wound bed showed signs of good granulation and epithelization at this point I do not see any evidence of active infection right now which is also great news and overall I am extremely happy with where we stand and how things are progressing. Electronic Signature(s) Signed: 05/13/2020 4:44:05 PM By: Worthy Keeler PA-C Entered By: Worthy Keeler on 05/13/2020 16:44:04 Timothy Gibson ([id number]) -------------------------------------------------------------------------------- Physician Orders Details Patient Name: Timothy Gibson Date of Service: 05/13/2020 12:30 PM Medical Record Number: [id number] Patient Account Number: [id number] Date of Birth/Sex: 06-11-1951 (69 y.o. M) Treating RN: Dolan Amen Primary Care  Provider: Merrie Roof Other Clinician: Referring Provider: Merrie Roof Treating Provider/Extender: Skipper Cliche in Treatment: 1 Verbal / Phone Orders: No Diagnosis Coding ICD-10 Coding Code Description [phone] Other chronic osteomyelitis, left ankle and foot E11.621 Type 2 diabetes mellitus with foot ulcer L97.522 Non-pressure chronic ulcer of other part of left foot with fat layer exposed I10 Essential (primary) hypertension Wound Cleansing Wound #3 Left Toe Great o Dial antibacterial soap, wash wounds, rinse and pat dry prior to dressing wounds o May Shower, gently pat wound dry prior to applying new dressing. Anesthetic (add to Medication List) Wound #3 Left Toe Great o Topical Lidocaine 4% cream applied to wound bed prior to debridement (In Clinic Only). Skin Barriers/Peri-Wound Care Wound #3 Left Toe Great o Other: - Gentamycin cream Primary Wound Dressing Wound #3 Left Toe Great o Silver Alginate Secondary Dressing Wound #3 Left Toe Great o Dry Gauze o Conform/Kerlix Dressing Change Frequency Wound #3 Left Toe Great o Change dressing every other day. o Other: - As needed Follow-up Appointments Wound #3 Left Toe Great o Return Appointment in 2 weeks. Off-Loading Wound #3 Left Toe Great o Open toe surgical shoe with peg assist. - left foot Electronic Signature(s) Signed: 05/13/2020 3:12:24 PM By: Georges Mouse, Minus Breeding Signed: 05/13/2020 5:01:49 PM By: Worthy Keeler PA-C Entered By: Georges Mouse, Minus Breeding on 05/13/2020 13:01:16 Timothy Gibson, Timothy Gibson ([id number]) Timothy Gibson, Timothy Gibson ([id number]) -------------------------------------------------------------------------------- Problem List Details Patient Name: Timothy Gibson Date of Service: 05/13/2020 12:30 PM Medical Record Number: [id number] Patient Account Number: [id number] Date of Birth/Sex: 1950-11-06 (69 y.o. M) Treating RN: Cornell Barman Primary Care Provider: Merrie Roof Other  Clinician: Referring Provider: Merrie Roof Treating Provider/Extender: Skipper Cliche in Treatment: 55 Active Problems ICD-10 Encounter Code Description Active Date MDM Diagnosis [phone] Other chronic osteomyelitis, left ankle and foot 07/29/2019  No Yes E11.621 Type 2 diabetes mellitus with foot ulcer 07/15/2019 No Yes L97.522 Non-pressure chronic ulcer of other part of left foot with fat layer 07/15/2019 No Yes exposed [place] (primary) hypertension 07/15/2019 No Yes Inactive Problems Resolved Problems Electronic Signature(s) Signed: 05/13/2020 12:49:20 PM By: Worthy Keeler PA-C Entered By: Worthy Keeler on 05/13/2020 12:49:19 Timothy Gibson ([id number]) -------------------------------------------------------------------------------- Progress Note Details Patient Name: Timothy Gibson Date of Service: 05/13/2020 12:30 PM Medical Record Number: [id number] Patient Account Number: [id number] Date of Birth/Sex: 1951/01/08 (69 y.o. M) Treating RN: Cornell Barman Primary Care Provider: Merrie Roof Other Clinician: Referring Provider: Merrie Roof Treating Provider/Extender: Skipper Cliche in Treatment: 68 Subjective Chief Complaint Information obtained from Patient Left Great Toe History of Present Illness (HPI) 11/27/17 on evaluation today patient presents for initial evaluation concerning an issue that he has been having with his right great toe which began last Thursday 11/22/17. He has a history of diabetes mellitus type to which he has had for greater than 30 years currently he is on insulin. Subsequently he also has a history of hypertension. On physical exam inspection is also appears he may have some peripheral vascular disease with his ABI's being noncompressible bilaterally. He is on dialysis as well and this is on Monday, Wednesday, and Friday. Patient was hospitalized back in January/February 2019 although this was more for stomach/back pain though they never told  me exactly what was going on. This is according to the patient. Subsequently the ulcer which is between the third and fourth toe when space of the right foot as well is on the plantar aspect of the fourth toe is due to him having cleaned his toes this morning and he states that his finger which is large split the toe tissue in between causing the wounds that we currently see. With that being said he states this has happened before I explained I would definitely recommend that he not do this any longer. He can use a small soft washcloth to clean between the toes without causing trauma. Subsequently the right great toe hatchery does show evidence of necrotic tissue present on the surface of the wound specifically there is callous and dead skin surrounding which is trapping fluid causing problems as far as the wound is concerned. The surface of the wound does show slough although due to his vascular flow I'm not going to sharply debride this today I think I will selectively debride away the necrotic skin as well is callous from around the surrounding so this will not continue to be a moisture issue. Nonetheless the patient has no pain he does have diabetic neuropathy. 12/06/17-He is here in follow-up evaluation for a right great toe ulcer. He is voicing no complaints or concerns. He continues to infrequently/socially smokes cigars with no desire to quit. He has been compliant with offloading, using open toed surgical shoe; has been compliant using Santyl Timothy Gibson. He continues on clindamycin although takes it inconsistently secondary to indigestion. The plain film x-ray performed on 5/23 impression: Soft tissue swelling of the left first toe and is noted with probable irregular lucency involving distal tuft of first distal phalanx concerning for osteomyelitis. MRI may be performed for further evaluation; MRI ordered. The vascular evaluation performed on 5/24 field non- compressible ABIs bilaterally and  reduced TBI bilaterally suggesting significant tibial disease. He will be referred to vascular medicine for further evaluation. 12/13/17-He is here in follow-up evaluation for right great toe ulcer. He has appointment next Thursday  for the MRI and vascular evaluation. Wound culture obtained last week grew abundant Klebsiella oxytoca (multidrug sensitivity), and abundant enteric coccus faecalis (sensitive to ampicillin). Ampicillin and Cipro were called in, along with a probiotic. In light of his appointments next Thursday he will follow-up in 2 weeks, we will continue with Santyl 12/27/17-He is here for evaluation for a right great toe ulcer. 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 He did have evaluation by vascular medicine, Dr. Ronalee Belts, on 6/13, at that appointment it was decided for him to undergo angiography with possible intervention but he refused and is still considering. If he chooses to have it done he will contact the office. He has not heard back from either ID offices that he was referred to two weeks ago: [place] and [place]. there is improvement in both appearance and measurement to the ulcer. We will extend antibiotic therapy (amoxicillin 500 every 12, Cipro 500 Timothy Gibson) for additional 2 weeks pending infectious disease consult, he will continue with Santyl Timothy Gibson. He was reminded to continue with probiotic therapy while on oral antibiotics; he denies any GI disturbance. 01/03/18-He is here in follow-up evaluation for right great toe ulcer. He is decided to not undergo any vascular intervention at this time. He was established with [facility] infectious disease (Dr. Linus Salmons) last week initiated on vancomycin and cefazolin with dialysis treatment for 6 weeks. We will continue with current treatment plan with Santyl and offloading and he will follow-up in 2 weeks 01/17/18-He is seen in follow-up evaluation for right  great toe ulcer. There is improvement in appearance with less nonviable tissue present. We will continue with same treatment plan he will follow-up next week. He is tolerating IV antibiotics without any complications 10/17/79-XB is seen in follow up evaluation for right great toe ulcer. There continues to be improvement. He is tolerating IV antibiotics with hemodialysis, completion date of 7/31. We will transition to collagen and and follow-up next week 02/07/18-He is here in follow up for a right great toe ulcer. There is red granulation tissue throughout the wound, improved in appearance. He completed antibiotics yesterday. He saw podiatry last Thursday for nail trimming, at that appointment he periwound callus was trimmed and a culture was obtained; culture negative. He does not have a follow up appointment with infectious disease. We will submit for grafix. 02/14/18-He is here in follow-up evaluation for a right great toe ulcer. There is slow improvement, red granulation tissue throughout. The insurance approval for grafix is pending. We will continue with collagen and he'll follow-up next week 02/21/18-He is seen in follow-up evaluation for right great toe ulcer. There is improvement with red granulation tissue throughout. He was approved for oasis and this was placed today. He will follow up next week 02/28/18-He is seen in follow-up evaluation for right great toe ulcer. The wound is stable, oasis applied and he'll follow-up next week 03/07/18-He is seen in follow-up violation of her right great toe ulcer. The wound is dry, no evidence of drainage. The wound appears healed today. He was painted with Betadine, instructed to paint with Betadine Timothy Gibson, cover with band-aid for the next week and then cover with band-aid for the following week continue with surgical shoe. He was encouraged to contact the clinic with any evidence of drainage, or change in appearance to toe/wound. He will be discharged from  wound care services Readmission: 07/14/18 on evaluation today patient presents for initial evaluation our clinic  concerning issues that he is having with his left great toe. We previously saw him in 2019 for his right great toe which was actually worse at that time and subsequently was able to be completely healed in the end although it did take several months. With that being said the patient is currently having an area on his left great toe that occurred initially as result of a blister that came up he's not really sure why or how. Subsequently this was initially treated by his podiatrist though the recommendation there was apparently amputation according to the patient and his daughter who was present with him today. With that being said the patient did not want to proceed with amputation and would like to try to perform wound care and get this to heal without having to go down that road. We were able to do that with his other toe and so he would like to at least give that a try before going forward with amputation. BRADELY, RUDIN ([id number]) Again I completely understand his concern in this regard. With that being said I did discuss with the patient obviously that it's always a chance that we cannot get this to heal with normal wound care measures but obviously we will give it our best shot. He does actually have an appointment scheduled for later today with the vascular specialist in order to evaluate his blood flow. That was at the recommendation of his podiatrist as well. Obviously if he is having good arterial flow and nonetheless is still experiencing issues with having the wound delay in healing then it may simply be a matter of we need to initiate more aggressive wound to therapy in order to see if we can get this to heal. No fevers, chills, nausea, or vomiting noted at this time. The patient notes that this woman has been present for approximately three weeks. He is not a smoker. His  most recent hemoglobin A1c was 6.2 on 06/18/19. 07/17/2019 on evaluation today patient appears unfortunately to not be doing as well. He did see vascular I did review the note as well. That was on 07/15/2019. Subsequently there can actually be taking him to the OR for an angiogram due to poor arterial flow into this extremity. Apparently the atherosclerotic changes were severe. The patient is stated to be at risk for limb loss. Nonetheless the good news is this is being scheduled fairly quickly he will have the surgery/procedure next Thursday. With that being said I am going also go ahead based on the results of his x-ray which showed some chance of osteomyelitis in the distal portion of the toe get the patient set up for an MRI to further evaluate for the possibility of osteomyelitis obviously if he does have osteomyelitis we need to know so that we can address this appropriately. 07/29/2019 upon evaluation today patient appears to be doing better in general with regard to his toe ulcer. He does have much better blood flow you can actually feel a palpable pulse which appears to be very strong at this time. I did review his arterial intervention/angiogram which did show that he had evidence of sufficient arterial flow restoration at this point. He did have occlusions that were 60 and 80% that residually were only 10 and 25% with no limiting flow. This is excellent news and hopefully he will continue to show signs of improvement in light of the improved vascular status. With that being said the toe mainly shows somewhat of an eschar today  I think that this is fairly stable and as such probably would be best not to really do much in the way of debridement at this point I do believe Betadine would likely be a good option. 08/07/2019 on evaluation today patient actually appears to be doing about the same. The one difference is the eschar that we were just seeing if we can maintain is actually starting to loosen  up and leak around the edges I think it is time to actually go ahead and remove this today. Is also no longer stable is very soft. Nonetheless the patient is okay with proceeding with debridement at this point. Fortunately there is no signs of active infection. No fevers, chills, nausea, vomiting, or diarrhea. The patient states that he took his last antibiotic that he had last night. He would need a refill as he will not be seeing infectious disease until February 2. Nonetheless obviously I do not want him to have any break in therapy especially when he is doing as well as he is currently. 08/14/2019 on evaluation today patient appears to be doing quite well all things considered in regard to his toe. I do feel like he is making progress and though this is not completely healed by any means we still have some ways to go I feel like he is making progress which is excellent. There is no sign of active infection at this time systemically. He does have osteomyelitis of the distal phalanx of his great toe on the left. He did see Dr. Linus Salmons. That is the infectious disease doctor in [place] where he was referred. Dr. Linus Salmons apparently according to the note did not feel like the patient's MRI revealed osteomyelitis but just reactive marrow and subsequently did not recommend any antibiotic therapy whatsoever. To be peripherally honest I am not really in agreement with this based on the MRI results and what we are seeing and I think he has been responding at least decently well to oral medication as well. Nonetheless we may want to check into the possibility of IV antibiotics being administered at the dialysis center. I will discuss this with Dr. Dellia Nims further. 08/21/2019 upon evaluation today patient appears at this point to be making some progress with regard to his wounds. He has been tolerating the dressing changes without complication. Fortunately there is no evidence of active infection at this time.  Overall I feel like he is doing well with the oral antibiotics I discussed with Dr. Dellia Nims the possibility of doing IV antibiotic therapy versus continuing with the oral. His opinion was if the patient is doing well with oral to maybe stick with that. Obviously we can initiate additional Cipro as well just to help ensure that there is nothing worsening here. The Cipro should be beneficial for him as well. That helps cover more gram-negative's were is at the doxycycline is more gram- positive's. 09/02/2019 on evaluation today patient appears to be doing fairly well upon inspection today. With that being said though the patient is continuing to do well he does also continue to develop issues with buildup of necrotic tissue on the distal portion of his toe he does have good arterial flow however. For that reason I am going to suggest that what we may need to do is consider using something such as Santyl to try to keep this free and clear. Also discussed in greater detail HBO therapy with him today please see plan for additional recommendations and results of this discussion. 09/11/2019 upon  evaluation today patient appears to be doing well with regard to his toe ulcer. This actually appears to be much better today using the Santyl than it was previous. Fortunately there is no signs of active infection. No fevers, chills, nausea, vomiting, or diarrhea. 09/18/2019 upon evaluation today patient appears to be doing much better in regard to his wound in general. The Santyl was doing a good job at loosening stuff up here for [place] which is excellent news. With that being said he is still taking the oral antibiotic. I sent this in last for him on 09/03/2019. This was the doxycycline. When this runs out he will actually be complete as far as the treatment is concerned. With that being said I am getting continue the Cipro for 1 additional month. Overall his wound seems to be doing quite well. 09/25/2019 upon evaluation  today patient appears to be doing well with regard to his toe ulcer. This is measuring much better and overall seems to be making good progress. Fortunately there is no signs of active infection at this time. No fevers, chills, nausea, vomiting, or diarrhea. 10/02/2019 upon evaluation today patient appears to be doing well with regard to his wound. He has been tolerating the dressing changes without complication. Fortunately there is no signs of active infection at this time. No fevers, chills, nausea, vomiting, or diarrhea. Overall I am extremely pleased with how things seem to be progressing. I think the wound bed is at the point now where we can likely proceed with additional measures to try to get this healed more quickly I think Dermagraft may be a good possibility for the patient. We can definitely look into seeing about approval for this. 10/09/2019 upon evaluation today patient's wound actually appears to be doing well although it still is progressing somewhat slowly compared to what I would like to see. With that being said I did order Apligraf for the patient which I felt would keep the wound nice and moist without allowing it to dry out and hopefully allow this to heal much more effectively and quickly. The patient actually did obtain approval and therefore we can apply that today. 10/23/2019 upon evaluation today patient's wound actually appears to be doing significantly better with regard to his toe ulcer. The tissue at the base of the wound is greatly improved and overall I am very pleased with the progress made. We did use Apligraf 2 weeks ago on the toe that did excellent for him much it has completely dissolved after as of the 2 weeks. They only had to change the dressing a few times due to drainage through. Obviously they left the Steri-Strips and Mepitel in place. Unfortunately we do not have the Apligraf available today for application regard therefore have to use a different product  here in the clinic today and then order the Apligraf for next Thursday. That will be his second application. 4/22; patient arrived with overhanging skin and subcutaneous tissue on the wound margins this was removed. Apligraf #2 applied Timothy Gibson, Timothy Gibson ([id number]) 11/13/2019 upon evaluation today patient actually appears to be doing well with regard to his foot ulcer. He has been tolerating the dressing changes without complication. The overall wound bed appears to be healthier and has filled in as well as not nearly as deep as it was the tissue is also better quality. Overall I am extremely pleased with how things seem to be progressing. The patient is likewise very happy in this regard. 5/20; wound is measuring  smaller had Apligraf #3 last time. Apligraf #4 today 12/12/2019 upon evaluation today patient actually appears to be doing quite well with regard to his wound in my opinion. The ulcer is significantly smaller compared to last time I saw him although that has been several weeks. I do believe the Apligraf has been beneficial he has 1 remaining today this will be application #5. 3/50/0938 upon evaluation today patient appears to be doing well with regard to his toe ulcer. He is measuring about the same today but he has come a long way from where this started. Fortunately there is no evidence of active infection which is great news and overall I am very pleased with the progress that is been made. Nonetheless at this point we have completed the Apligraf course he did see improvement through this but I am going to switch to something different as of today. 01/08/2020 upon evaluation today patient appears to be doing much better in regards to his toe ulcer this is measuring smaller and overall very pleased with the progress. The Hydrofera Blue seems to be doing a great job. Fortunately there is no evidence of active infection at this time. No fevers, chills, nausea, vomiting, or diarrhea. 01/29/2020  upon evaluation today patient appears to be doing okay in regard to his toe ulcer. The main issue I see is that he actually has trouble right now with callus growing and covering over the wound bed. This prevents him from being able to get a dressing in contact with the wound bed and subsequently fluid collection underneath. I think that we probably need to see him more frequently to keep this pared down and debrided to the point to allow the dressings to effectively heal this wound. I discussed that with the patient today. 02/05/2020 on evaluation today patient appears to be doing well with regard to his wounds currently. In fact he just has 1 on his toe remaining at this point he seems to be doing quite well in my opinion. I am very pleased with the overall appearance today. There is no signs of active infection I do think keeping the callus trimmed down has been of great benefit as far as healing is concerned. I do not think we need to go 3 weeks out like we were in the past I believe that is been somewhat more detrimental than good for him. 02/19/2020 upon evaluation today patient appears to be doing better in regard to his toe ulcer still there is callus buildup around the top of the toe which unfortunately is I think causing some issues here with trapping a little fluid underneath them after cleaning this away. Fortunately there is no evidence of active infection at this time. Overall I feel like the patient is doing quite well which is great news. 03/04/2020 on evaluation today patient appears to be doing well with regard to his toe ulcer though he still has a lot of drying out of the endoform and a lot of callus buildup as well unfortunately. With that being said I think that he is can require some sharp debridement to clear away some of the callus today. We will see what the wound looks like following. 03/11/2020 on evaluation today patient appears to be doing excellent in regard to his toe ulcer.  He did have a lot of callus and even some of the nail, growing over top of the area where the wound is trying to heal. For that reason I did actually trim back some  of the nail also did go ahead and trim away some of the callus as well. He tolerated all this without complication the wound appears to be doing well and is showing no signs of infection at this point. Overall I am very pleased. 03/18/2020 upon evaluation today patient appears to be doing well with regard to his toe ulcer. There does not appear to be any significant buildup of callus at this point and overall I do believe that the alginate has been beneficial. Trimming back the toenail also seems to have helped a lot in this regard I think that was causing some rubbing on the end of the toe. Fortunately overall he is doing much better unfortunately he notes that his grandson has been placed on hospice he is 3 and apparently not doing too well. He wants to make a trip to go see him I really think that something he probably should do and not wait for this to try to heal. 04/01/2020 upon evaluation today patient's toe ulcer appears to be doing about the same. He does have some callus buildup slowly he is making progress but were not completely closed as of yet. I am going to need to perform some debridement today to clear this way. 04/15/2020 on evaluation today patient appears to be doing well in general in regard to his wounds there is no signs of active infection in regard to the toe obviously although he does have a lot of drainage which does raise a bit of a question in that regard. I did discuss with him possibilities for we may want to do treatment wise to try to help out with this. With that being said I Georgina Peer probably start with a topical antibiotic and continuing with the current dressing for the time being. 04/29/2020 on evaluation today patient appears to be doing decently well in regard to his toe ulcer. There is no signs of active  infection at this time. Fortunately I believe that the patient has made some progress since last time I saw him. Still this is taken much longer than I would like to see obviously the patient is ready for this to be done. 05/13/2020 upon evaluation today patient appears to be showing some signs of improvement with regard to his toe ulcer this is great news and overall very pleased in that regard. There is no evidence of active infection currently which is great news as well. No fevers, chills, nausea, vomiting, or diarrhea. Objective Constitutional Well-nourished and well-hydrated in no acute distress. Vitals Time Taken: 12:35 PM, Height: 76 in, Weight: 244 lbs, BMI: 29.7, Temperature: 97.5 F, Pulse: 81 bpm, Respiratory Rate: 20 breaths/min, Blood Pressure: 182/57 mmHg. Timothy Gibson, Timothy Gibson ([id number]) Respiratory normal breathing without difficulty. Psychiatric this patient is able to make decisions and demonstrates good insight into disease process. Alert and Oriented x 3. pleasant and cooperative. General Notes: Patient's wound bed showed signs of good granulation and epithelization at this point I do not see any evidence of active infection right now which is also great news and overall I am extremely happy with where we stand and how things are progressing. Integumentary (Hair, Skin) Wound #3 status is Open. Original cause of wound was Gradually Appeared. The wound is located on the Left Toe Great. The wound measures 0.1cm length x 0.1cm width x 0.1cm depth; 0.008cm^2 area and 0.001cm^3 volume. There is Fat Layer (Subcutaneous Tissue) exposed. There is no tunneling or undermining noted. There is a none present amount of drainage noted.  The wound margin is thickened. There is medium (34- 66%) pale granulation within the wound bed. There is no necrotic tissue within the wound bed. 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 There was a Excisional Skin/Subcutaneous Tissue Debridement with a total area of 0.08 sq cm performed by Tommie Sams., PA-C. With the following instrument(s): Curette to remove Viable and Non-Viable tissue/material. Material removed includes Callus and Subcutaneous Tissue and. A time out was conducted at 12:55, prior to the start of the procedure. A Minimum amount of bleeding was controlled with Pressure. The procedure was tolerated well with a pain level of Insensate throughout and a pain level of Insensate following the procedure. Post Debridement Measurements: 0.2cm length x 0.4cm width x 0.3cm depth; 0.019cm^3 volume. Character of Wound/Ulcer Post Debridement is improved. Severity of Tissue Post Debridement is: Fat layer exposed. Post procedure Diagnosis Wound #3: Same as Pre-Procedure Plan Wound Cleansing: Wound #3 Left Toe Great: Dial antibacterial soap, wash wounds, rinse and pat dry prior to dressing wounds May Shower, gently pat wound dry prior to applying new dressing. Anesthetic (add to Medication List): Wound #3 Left Toe Great: Topical Lidocaine 4% cream applied to wound bed prior to debridement (In Clinic Only). Skin Barriers/Peri-Wound Care: Wound #3 Left Toe Great: Other: - Gentamycin cream Primary Wound Dressing: Wound #3 Left Toe Great: Silver Alginate Secondary Dressing: Wound #3 Left Toe Great: Dry Gauze Conform/Kerlix Dressing Change Frequency: Wound #3 Left Toe Great: Change dressing every other day. Timothy Gibson, Timothy Gibson ([id number]) Other: - As needed Follow-up Appointments: Wound #3 Left Toe Great: Return Appointment in 2  weeks. Off-Loading: Wound #3 Left Toe Great: Open toe surgical shoe with peg assist. - left foot 1. I would recommend currently that we go ahead and continue with the wound care measures as before we are going to prevent this from closing up with callus that is the main reason I am seeing him as frequently as I am. We are using silver alginate to try to keep it nice and clean I think that still done a good job for him unfortunately the collagen just feels that up too quickly which I think is not beneficial for him. 2. I do not see any signs of infection which is great news and overall very pleased I am hopeful that we will be able to get him healed shortly. We will see patient back for reevaluation in 1 week here in the clinic. If anything worsens or changes patient will contact our office for additional recommendations. Electronic Signature(s) Signed: 05/13/2020 4:44:42 PM By: Worthy Keeler PA-C Entered By: Worthy Keeler on 05/13/2020 16:44:42 Timothy Gibson, Timothy Gibson ([id number]) -------------------------------------------------------------------------------- SuperBill Details Patient Name: Timothy Gibson Date of Service: 05/13/2020 Medical Record Number: [id number] Patient Account Number: [id number] Date of Birth/Sex: 11/26/1950 (69 y.o. M) Treating RN: Cornell Barman Primary Care Provider: Merrie Roof Other Clinician: Referring Provider: Merrie Roof Treating Provider/Extender: Skipper Cliche in Treatment: 43 Diagnosis Coding ICD-10 Codes Code Description [phone] Other chronic osteomyelitis, left ankle and foot E11.621 Type 2 diabetes mellitus with foot ulcer L97.522 Non-pressure chronic ulcer of other part of left foot with fat layer exposed I10  Essential (primary) hypertension Facility Procedures CPT4 Code: [id number] Description: 03009 - DEB SUBQ TISSUE 20 SQ CM/< Modifier: Quantity: 1 CPT4 Code: Description: ICD-10 Diagnosis Description L97.522 Non-pressure chronic ulcer of  other part of left foot with fat layer exp Modifier: osed Quantity: Physician Procedures CPT4 Code: [id number] Description: 11042 - WC PHYS SUBQ TISS 20 SQ CM Modifier: Quantity: 1 CPT4 Code: Description: ICD-10 Diagnosis Description L97.522 Non-pressure chronic ulcer of other part of left foot with fat layer exp Modifier: osed Quantity: Electronic Signature(s) Signed: 05/13/2020 4:45:18 PM By: Worthy Keeler PA-C Entered By: Worthy Keeler on 05/13/2020 16:45:17

## 2020-05-13 NOTE — Progress Notes (Signed)
Timothy Gibson, Timothy Gibson ([id number]) Visit Report for 05/13/2020 Arrival Information Details Patient Name: Timothy Gibson, Timothy Gibson Date of Service: 05/13/2020 12:30 PM Medical Record Number: [id number] Patient Account Number: [id number] Date of Birth/Sex: Sep 17, 1950 (69 y.o. M) Treating RN: Dolan Amen Primary Care Nayra Coury: Merrie Roof Other Clinician: Referring Salimatou Simone: Merrie Roof Treating Gay Moncivais/Extender: Skipper Cliche in Treatment: 76 Visit Information History Since Last Visit Pain Present Now: No Patient Arrived: Cane Arrival Time: 12:34 Accompanied By: friend/caregiver Transfer Assistance: None Patient Identification Verified: Yes Secondary Verification Process Completed: Yes Patient Requires Transmission-Based Precautions: No Patient Has Alerts: No Electronic Signature(s) Signed: 05/13/2020 3:12:24 PM By: Georges Mouse, Minus Breeding Entered By: Georges Mouse, Minus Breeding on 05/13/2020 12:35:28 Timothy Gibson ([id number]) -------------------------------------------------------------------------------- Encounter Discharge Information Details Patient Name: Timothy Gibson Date of Service: 05/13/2020 12:30 PM Medical Record Number: [id number] Patient Account Number: [id number] Date of Birth/Sex: [birth year] (69 y.o. M) Treating RN: Dolan Amen Primary Care Anson Peddie: Merrie Roof Other Clinician: Referring Danny Zimny: Merrie Roof Treating Rena Hunke/Extender: Skipper Cliche in Treatment: 2 Encounter Discharge Information Items Post Procedure Vitals Discharge Condition: Stable Temperature (F): 97.5 Ambulatory Status: Cane Pulse (bpm): 81 Discharge Destination: Home Respiratory Rate (breaths/min): 20 Transportation: Private Auto Blood Pressure (mmHg): 182/57 Accompanied By: friend Schedule Follow-up Appointment: Yes Clinical Summary of Care: Electronic Signature(s) Signed: 05/13/2020 3:12:24 PM By: Georges Mouse, Minus Breeding Entered By: Georges Mouse, Minus Breeding on 05/13/2020  13:04:31 Timothy Gibson ([id number]) -------------------------------------------------------------------------------- Lower Extremity Assessment Details Patient Name: Timothy Gibson Date of Service: 05/13/2020 12:30 PM Medical Record Number: [id number] Patient Account Number: [id number] Date of Birth/Sex: 06-Dec-1950 (69 y.o. M) Treating RN: Dolan Amen Primary Care Lekia Nier: Merrie Roof Other Clinician: Referring Greenley Martone: Merrie Roof Treating Robben Jagiello/Extender: Skipper Cliche in Treatment: 43 Edema Assessment Assessed: [Left: Yes] [Right: No] Edema: [Left: Ye] [Right: s] Calf Left: Right: Point of Measurement: From Medial Instep 42 cm Ankle Left: Right: Point of Measurement: From Medial Instep 32.5 cm Vascular Assessment Pulses: Dorsalis Pedis Palpable: [Left:Yes] Electronic Signature(s) Signed: 05/13/2020 3:12:24 PM By: Georges Mouse, Minus Breeding Entered By: Georges Mouse, Minus Breeding on 05/13/2020 12:50:03 Timothy Gibson ([id number]) -------------------------------------------------------------------------------- [place] Details Patient Name: Timothy Gibson Date of Service: 05/13/2020 12:30 PM Medical Record Number: [id number] Patient Account Number: [id number] Date of Birth/Sex: January 05, 1951 (69 y.o. M) Treating RN: Dolan Amen Primary Care Altan Kraai: Merrie Roof Other Clinician: Referring Oluwadamilola Deliz: Merrie Roof Treating Jaquil Todt/Extender: Skipper Cliche in Treatment: 43 Active Inactive Necrotic Tissue Nursing Diagnoses: Impaired tissue integrity related to necrotic/devitalized tissue Goals: Necrotic/devitalized tissue will be minimized in the wound bed Date Initiated: 07/15/2019 Target Resolution Date: 10/18/2019 Goal Status: Active Interventions: Provide education on necrotic tissue and debridement process Notes: Peripheral Neuropathy Nursing Diagnoses: Knowledge deficit related to disease process and management of peripheral  neurovascular dysfunction Goals: Patient/caregiver will verbalize understanding of disease process and disease management Date Initiated: 07/15/2019 Target Resolution Date: 10/18/2019 Goal Status: Active Interventions: Assess signs and symptoms of neuropathy upon admission and as needed Provide education on Management of Neuropathy and Related Ulcers Notes: Wound/Skin Impairment Nursing Diagnoses: Impaired tissue integrity Goals: Ulcer/skin breakdown will heal within 14 weeks Date Initiated: 07/15/2019 Target Resolution Date: 10/18/2019 Goal Status: Active Interventions: Assess patient/caregiver ability to obtain necessary supplies Assess patient/caregiver ability to perform ulcer/skin care regimen upon admission and as needed Assess ulceration(s) every visit Notes: Electronic Signature(s) Signed: 05/13/2020 3:12:24 PM By: Georges Mouse, Minus Breeding Entered By: Georges Mouse, Minus Breeding on 05/13/2020 12:54:49 Timothy Gibson ([id number]) -------------------------------------------------------------------------------- Pain Assessment Details Patient Name: Timothy Gibson. Date  of Service: 05/13/2020 12:30 PM Medical Record Number: [id number] Patient Account Number: [id number] Date of Birth/Sex: 10/02/1950 (69 y.o. M) Treating RN: Dolan Amen Primary Care Arizbeth Cawthorn: Merrie Roof Other Clinician: Referring Terez Freimark: Merrie Roof Treating Malloree Raboin/Extender: Skipper Cliche in Treatment: 50 Active Problems Location of Pain Severity and Description of Pain Patient Has Paino No Site Locations Rate the pain. Current Pain Level: 0 Pain Management and Medication Current Pain Management: Electronic Signature(s) Signed: 05/13/2020 3:12:24 PM By: Georges Mouse, Minus Breeding Entered By: Georges Mouse, Minus Breeding on 05/13/2020 12:39:37 Timothy Gibson ([id number]) -------------------------------------------------------------------------------- Patient/Caregiver Education Details Patient Name:  Timothy Gibson Date of Service: 05/13/2020 12:30 PM Medical Record Number: [id number] Patient Account Number: [id number] Date of Birth/Gender: 1951-05-15 (69 y.o. M) Treating RN: Dolan Amen Primary Care Physician: Merrie Roof Other Clinician: Referring Physician: Merrie Roof Treating Physician/Extender: Skipper Cliche in Treatment: 71 Education Assessment Education Provided To: Patient Education Topics Provided Wound Debridement: Handouts: Wound Debridement Methods: Demonstration, Explain/Verbal Responses: State content correctly Electronic Signature(s) Signed: 05/13/2020 3:12:24 PM By: Georges Mouse, Minus Breeding Entered By: Georges Mouse, Minus Breeding on 05/13/2020 13:03:52 Timothy Gibson ([id number]) -------------------------------------------------------------------------------- Wound Assessment Details Patient Name: Timothy Gibson Date of Service: 05/13/2020 12:30 PM Medical Record Number: [id number] Patient Account Number: [id number] Date of Birth/Sex: 26-Jan-1951 (69 y.o. M) Treating RN: Dolan Amen Primary Care Jannae Fagerstrom: Merrie Roof Other Clinician: Referring Jamisyn Langer: Merrie Roof Treating Jensen Cheramie/Extender: Skipper Cliche in Treatment: 43 Wound Status Wound Number: 3 Primary Diabetic Wound/Ulcer of the Lower Extremity Etiology: Wound Location: Left Toe Great Wound Open Wounding Event: Gradually Appeared Status: Date Acquired: 06/19/2019 Comorbid Lymphedema, Arrhythmia, Hypertension, Type II Weeks Of Treatment: 43 History: Diabetes, End Stage Renal Disease, Rheumatoid Arthritis, Clustered Wound: No Osteoarthritis, Confinement Anxiety Photos Wound Measurements Length: (cm) 0.1 Width: (cm) 0.1 Depth: (cm) 0.1 Area: (cm) 0.008 Volume: (cm) 0.001 % Reduction in Area: 99.7% % Reduction in Volume: 99.6% Epithelialization: Large (67-100%) Tunneling: No Undermining: No Wound Description Classification: Grade 2 Wound Margin: Thickened Exudate  Amount: None Present Foul Odor After Cleansing: No Slough/Fibrino No Wound Bed Granulation Amount: Medium (34-66%) Exposed Structure Granulation Quality: Pale Fascia Exposed: No Necrotic Amount: None Present (0%) Fat Layer (Subcutaneous Tissue) Exposed: Yes Tendon Exposed: No Muscle Exposed: No Joint Exposed: No Bone Exposed: No Electronic Signature(s) Signed: 05/13/2020 3:12:24 PM By: Georges Mouse, Minus Breeding Entered By: Georges Mouse, Minus Breeding on 05/13/2020 12:47:06 Timothy Gibson ([id number]) -------------------------------------------------------------------------------- [facility] [place] Details Patient Name: Timothy Gibson Date of Service: 05/13/2020 12:30 PM Medical Record Number: [id number] Patient Account Number: [id number] Date of Birth/Sex: 22-Apr-1951 (69 y.o. M) Treating RN: Dolan Amen Primary Care Talon Regala: Merrie Roof Other Clinician: Referring Nivaan Dicenzo: Merrie Roof Treating Fitzroy Mikami/Extender: Skipper Cliche in Treatment: 8 Vital Signs Time Taken: 12:35 Temperature (F): 97.5 Height (in): 76 Pulse (bpm): 81 Weight (lbs): 244 Respiratory Rate (breaths/min): 20 Body Mass Index (BMI): 29.7 Blood Pressure (mmHg): 182/57 Reference Range: 80 - 120 mg / dl Electronic Signature(s) Signed: 05/13/2020 3:12:24 PM By: Georges Mouse, Minus Breeding Entered By: Georges Mouse, Minus Breeding on 05/13/2020 12:39:28

## 2020-05-14 DIAGNOSIS — D631 Anemia in chronic kidney disease: Secondary | ICD-10-CM | POA: Diagnosis not present

## 2020-05-14 DIAGNOSIS — N2581 Secondary hyperparathyroidism of renal origin: Secondary | ICD-10-CM | POA: Diagnosis not present

## 2020-05-14 DIAGNOSIS — D509 Iron deficiency anemia, unspecified: Secondary | ICD-10-CM | POA: Diagnosis not present

## 2020-05-14 DIAGNOSIS — Z992 Dependence on renal dialysis: Secondary | ICD-10-CM | POA: Diagnosis not present

## 2020-05-14 DIAGNOSIS — N186 End stage renal disease: Secondary | ICD-10-CM | POA: Diagnosis not present

## 2020-05-14 DIAGNOSIS — Z23 Encounter for immunization: Secondary | ICD-10-CM | POA: Diagnosis not present

## 2020-05-17 DIAGNOSIS — N2581 Secondary hyperparathyroidism of renal origin: Secondary | ICD-10-CM | POA: Diagnosis not present

## 2020-05-17 DIAGNOSIS — D509 Iron deficiency anemia, unspecified: Secondary | ICD-10-CM | POA: Diagnosis not present

## 2020-05-17 DIAGNOSIS — Z992 Dependence on renal dialysis: Secondary | ICD-10-CM | POA: Diagnosis not present

## 2020-05-17 DIAGNOSIS — Z23 Encounter for immunization: Secondary | ICD-10-CM | POA: Diagnosis not present

## 2020-05-17 DIAGNOSIS — D631 Anemia in chronic kidney disease: Secondary | ICD-10-CM | POA: Diagnosis not present

## 2020-05-17 DIAGNOSIS — N186 End stage renal disease: Secondary | ICD-10-CM | POA: Diagnosis not present

## 2020-05-18 ENCOUNTER — Other Ambulatory Visit: Payer: Self-pay

## 2020-05-18 ENCOUNTER — Ambulatory Visit (INDEPENDENT_AMBULATORY_CARE_PROVIDER_SITE_OTHER): Payer: Medicare Other | Admitting: Family Medicine

## 2020-05-18 ENCOUNTER — Encounter: Payer: Self-pay | Admitting: Family Medicine

## 2020-05-18 ENCOUNTER — Ambulatory Visit
Admission: RE | Admit: 2020-05-18 | Discharge: 2020-05-18 | Disposition: A | Discharge status: Home / Self Care | Payer: Medicare Other | Attending: Family Medicine | Admitting: Family Medicine

## 2020-05-18 ENCOUNTER — Ambulatory Visit
Admission: RE | Admit: 2020-05-18 | Discharge: 2020-05-18 | Disposition: A | Discharge status: Home / Self Care | Payer: Medicare Other | Source: Ambulatory Visit | Attending: Family Medicine | Admitting: Family Medicine

## 2020-05-18 VITALS — BP 165/80 | HR 84 | Temp 98.9°F | Wt 256.0 lb

## 2020-05-18 DIAGNOSIS — I70213 Atherosclerosis of native arteries of extremities with intermittent claudication, bilateral legs: Secondary | ICD-10-CM

## 2020-05-18 DIAGNOSIS — J189 Pneumonia, unspecified organism: Secondary | ICD-10-CM

## 2020-05-18 DIAGNOSIS — R0602 Shortness of breath: Secondary | ICD-10-CM | POA: Diagnosis not present

## 2020-05-18 DIAGNOSIS — R059 Cough, unspecified: Secondary | ICD-10-CM | POA: Diagnosis not present

## 2020-05-18 MED ORDER — LEVOFLOXACIN 750 MG PO TABS: 750.0000 mg | ORAL_TABLET | Freq: Every day | ORAL | 0 refills | Status: DC

## 2020-05-18 NOTE — Progress Notes (Signed)
BP (!) 165/80   Pulse 84   Temp 98.9 F (37.2 C)   Wt 256 lb (116.1 kg)   SpO2 100%   BMI 30.96 kg/m    Subjective:    Patient ID: Timothy Gibson, male    DOB: 1950-12-02, 69 y.o.   MRN: [id number]  HPI: Timothy Gibson is a 69 y.o. male  Chief Complaint  Patient presents with  . Cough    pt here to recheck lungs from last week, pt states his cough and congestion is getting worse    Cough is worse. Finished doxycycline 3 days ago. +SOB, + wheezing. No fever  Relevant past medical, surgical, family and social history reviewed and updated as indicated. Interim medical history since our last visit reviewed. Allergies and medications reviewed and updated.  Review of Systems  Constitutional: Positive for fatigue. Negative for activity change, appetite change, chills, diaphoresis, fever and unexpected weight change.  HENT: Negative.   Respiratory: Positive for cough, chest tightness, shortness of breath and wheezing. Negative for apnea, choking and stridor.   Cardiovascular: Negative.   Gastrointestinal: Negative.   Musculoskeletal: Negative.   Neurological: Negative.   Psychiatric/Behavioral: Negative.     Per HPI unless specifically indicated above     Objective:    BP (!) 165/80   Pulse 84   Temp 98.9 F (37.2 C)   Wt 256 lb (116.1 kg)   SpO2 100%   BMI 30.96 kg/m   Wt Readings from Last 3 Encounters:  05/18/20 256 lb (116.1 kg)  05/10/20 257 lb 12.8 oz (116.9 kg)  10/29/19 254 lb (115.2 kg)    Physical Exam Vitals and nursing note reviewed.  Constitutional:      General: He is not in acute distress.    Appearance: Normal appearance. He is not ill-appearing, toxic-appearing or diaphoretic.  HENT:     Head: Normocephalic and atraumatic.     Right Ear: External ear normal.     Left Ear: External ear normal.     Nose: Nose normal.     Mouth/Throat:     Mouth: Mucous membranes are moist.     Pharynx: Oropharynx is clear.  Eyes:     General: No scleral  icterus.       Right eye: No discharge.        Left eye: No discharge.     Extraocular Movements: Extraocular movements intact.     Conjunctiva/sclera: Conjunctivae normal.     Pupils: Pupils are equal, round, and reactive to light.  Cardiovascular:     Rate and Rhythm: Normal rate and regular rhythm.     Pulses: Normal pulses.     Heart sounds: Normal heart sounds. No murmur heard.  No friction rub. No gallop.   Pulmonary:     Effort: Pulmonary effort is normal. No respiratory distress.     Breath sounds: No stridor. Rhonchi (L lung and base of R lung) present. No wheezing or rales.  Chest:     Chest wall: No tenderness.  Musculoskeletal:        General: Normal range of motion.     Cervical back: Normal range of motion and neck supple.  Skin:    General: Skin is warm and dry.     Capillary Refill: Capillary refill takes less than 2 seconds.     Coloration: Skin is not jaundiced or pale.     Findings: No bruising, erythema, lesion or rash.  Neurological:     General: No  focal deficit present.     Mental Status: He is alert and oriented to person, place, and time. Mental status is at baseline.  Psychiatric:        Mood and Affect: Mood normal.        Behavior: Behavior normal.        Thought Content: Thought content normal.        Judgment: Judgment normal.     Results for orders placed or performed in visit on 05/10/20  CBC with Differential/Platelet  Result Value Ref Range   WBC 3.7 3.4 - 10.8 x10E3/uL   RBC 3.13 (L) 4.14 - 5.80 x10E6/uL   Hemoglobin 9.9 (L) 13.0 - 17.7 g/dL   Hematocrit 30.3 (L) 37.5 - 51.0 %   MCV 97 79 - 97 fL   MCH 31.6 26.6 - 33.0 pg   MCHC 32.7 31 - 35 g/dL   RDW 12.7 11.6 - 15.4 %   Platelets 127 (L) 150 - 450 x10E3/uL   Neutrophils 65 Not Estab. %   Lymphs 22 Not Estab. %   Monocytes 6 Not Estab. %   Eos 6 Not Estab. %   Basos 1 Not Estab. %   Neutrophils Absolute 2.4 1.40 - 7.00 x10E3/uL   Lymphocytes Absolute 0.8 0 - 3 x10E3/uL    Monocytes Absolute 0.2 0 - 0 x10E3/uL   EOS (ABSOLUTE) 0.2 0.0 - 0.4 x10E3/uL   Basophils Absolute 0.0 0 - 0 x10E3/uL   Immature Granulocytes 0 Not Estab. %   Immature Grans (Abs) 0.0 0.0 - 0.1 x10E3/uL  Comprehensive metabolic panel  Result Value Ref Range   Glucose 182 (H) 65 - 99 mg/dL   BUN 23 8 - 27 mg/dL   Creatinine, Ser 7.63 (H) 0.76 - 1.27 mg/dL   GFR calc non Af Amer 7 (L) >59 mL/min/1.73   GFR calc Af Amer 8 (L) >59 mL/min/1.73   BUN/Creatinine Ratio 3 (L) 10 - 24   Sodium 142 134 - 144 mmol/L   Potassium 4.0 3.5 - 5.2 mmol/L   Chloride 97 96 - 106 mmol/L   CO2 31 (H) 20 - 29 mmol/L   Calcium 8.7 8.6 - 10.2 mg/dL   Total Protein 6.6 6.0 - 8.5 g/dL   Albumin 3.7 (L) 3.8 - 4.8 g/dL   Globulin, Total 2.9 1.5 - 4.5 g/dL   Albumin/Globulin Ratio 1.3 1.2 - 2.2   Bilirubin Total 0.3 0.0 - 1.2 mg/dL   Alkaline Phosphatase 94 44 - 121 IU/L   AST 12 0 - 40 IU/L   ALT 8 0 - 44 IU/L  Lipid Panel w/o Chol/HDL Ratio  Result Value Ref Range   Cholesterol, Total 99 (L) 100 - 199 mg/dL   Triglycerides 74 0 - 149 mg/dL   HDL 36 (L) >39 mg/dL   VLDL Cholesterol Cal 16 5 - 40 mg/dL   LDL Chol Calc (NIH) 47 0 - 99 mg/dL  TSH  Result Value Ref Range   TSH 0.686 0.450 - 4.500 uIU/mL  Bayer DCA Hb A1c Waived  Result Value Ref Range   HB A1C (BAYER DCA - WAIVED) 5.6 <7.0 %      Assessment & Plan:   Problem List Items Addressed This Visit    None    Visit Diagnoses    Pneumonia of left lower lobe due to infectious organism    -  Primary   Worsening. Finished doxycycline. Will start levaquin and get CXR. Discussed warning signs for which to go to  ER   Relevant Medications   gentamicin cream (GARAMYCIN) 0.1 %   levofloxacin (LEVAQUIN) 750 MG tablet   Other Relevant Orders   DG Chest 2 View       Follow up plan: Return Friday.

## 2020-05-19 ENCOUNTER — Other Ambulatory Visit: Payer: Self-pay | Admitting: Family Medicine

## 2020-05-19 DIAGNOSIS — R0602 Shortness of breath: Secondary | ICD-10-CM

## 2020-05-19 DIAGNOSIS — Z992 Dependence on renal dialysis: Secondary | ICD-10-CM | POA: Diagnosis not present

## 2020-05-19 DIAGNOSIS — Z23 Encounter for immunization: Secondary | ICD-10-CM | POA: Diagnosis not present

## 2020-05-19 DIAGNOSIS — N2581 Secondary hyperparathyroidism of renal origin: Secondary | ICD-10-CM | POA: Diagnosis not present

## 2020-05-19 DIAGNOSIS — N186 End stage renal disease: Secondary | ICD-10-CM | POA: Diagnosis not present

## 2020-05-19 DIAGNOSIS — J189 Pneumonia, unspecified organism: Secondary | ICD-10-CM

## 2020-05-19 DIAGNOSIS — D509 Iron deficiency anemia, unspecified: Secondary | ICD-10-CM | POA: Diagnosis not present

## 2020-05-19 DIAGNOSIS — D631 Anemia in chronic kidney disease: Secondary | ICD-10-CM | POA: Diagnosis not present

## 2020-05-19 NOTE — Progress Notes (Signed)
FUTURE, [name] ([id number]) Visit Report for 04/29/2020 Arrival Information Details Patient Name: Timothy Gibson Date of Service: 04/29/2020 8:15 AM Medical Record Number: [id number] Patient Account Number: [id number] Date of Birth/Sex: 1950-12-02 (69 y.o. M) Treating RN: Timothy Gibson Primary Care Timothy Gibson: Timothy Gibson Other Clinician: Referring Timothy Gibson: Timothy Gibson Treating Timothy Gibson/Extender: Timothy Gibson, Timothy Gibson Weeks in Treatment: 34 Visit Information History Since Last Visit All ordered tests and consults were completed: No Patient Arrived: Ambulatory Added or deleted any medications: No Arrival Time: 08:32 Any new allergies or adverse reactions: No Accompanied By: self Had a fall or experienced change in No Transfer Assistance: None activities of daily living that may affect Patient Identification Verified: Yes risk of falls: Secondary Verification Process Completed: Yes Signs or symptoms of abuse/neglect since last visito No Patient Requires Transmission-Based Precautions: No Hospitalized since last visit: No Patient Has Alerts: No Implantable device outside of the clinic excluding No cellular tissue based products placed in the center since last visit: Has Dressing in Place as Prescribed: Yes Pain Present Now: No Electronic Signature(s) Signed: 05/19/2020 12:05:28 PM By: Timothy Coria RN Entered By: Timothy Gibson on 04/29/2020 08:33:08 Timothy Gibson ([id number]) -------------------------------------------------------------------------------- Encounter Discharge Information Details Patient Name: Timothy Gibson Date of Service: 04/29/2020 8:15 AM Medical Record Number: [id number] Patient Account Number: [id number] Date of Birth/Sex: 1950/09/17 (69 y.o. M) Treating RN: Timothy Gibson Primary Care Amarie Tarte: Timothy Gibson Other Clinician: Referring Nataley Bahri: Timothy Gibson Treating Evamaria Detore/Extender: Timothy Gibson, Timothy Gibson Weeks in Treatment: 40 Encounter Discharge  Information Items Post Procedure Vitals Discharge Condition: Stable Temperature (F): 98.2 Ambulatory Status: Ambulatory Pulse (bpm): 75 Discharge Destination: Home Respiratory Rate (breaths/min): 20 Transportation: Private Auto Blood Pressure (mmHg): 187/80 Accompanied By: self Schedule Follow-up Appointment: Yes Clinical Summary of Care: Electronic Signature(s) Signed: 04/29/2020 5:56:15 PM By: Timothy Gibson Entered By: Timothy Gibson, BSN, RN, CWS, Kim on 04/29/2020 08:54:16 Timothy Gibson ([id number]) -------------------------------------------------------------------------------- Lower Extremity Assessment Details Patient Name: Timothy Gibson Date of Service: 04/29/2020 8:15 AM Medical Record Number: [id number] Patient Account Number: [id number] Date of Birth/Sex: [birth year] (69 y.o. M) Treating RN: Timothy Gibson Primary Care Tashi Andujo: Timothy Gibson Other Clinician: Referring Timothy Gibson: Timothy Gibson Treating Timothy Gibson/Extender: Timothy Gibson, Timothy Gibson Weeks in Treatment: 41 Edema Assessment Assessed: [Left: No] [Right: No] [Left: Edema] [Right: :] Calf Left: Right: Point of Measurement: From Medial Instep 42 cm Ankle Left: Right: Point of Measurement: From Medial Instep 25 cm Electronic Signature(s) Signed: 05/19/2020 12:05:28 PM By: Timothy Coria RN Entered By: Timothy Gibson on 04/29/2020 08:42:58 Timothy Gibson ([id number]) -------------------------------------------------------------------------------- Multi Wound Chart Details Patient Name: Timothy Gibson Date of Service: 04/29/2020 8:15 AM Medical Record Number: [id number] Patient Account Number: [id number] Date of Birth/Sex: 1951/04/15 (69 y.o. M) Treating RN: Timothy Gibson Primary Care Nahima Ales: Timothy Gibson Other Clinician: Referring Timothy Gibson: Timothy Gibson Treating Timothy Gibson/Extender: Timothy Gibson, Timothy Gibson Weeks in Treatment: 60 Vital Signs Height(in): 76 Pulse(bpm): 75 Weight(lbs): 244 Blood  Pressure(mmHg): 187/80 Body Mass Index(BMI): 30 Temperature(F): 98.2 Respiratory Rate(breaths/min): 20 Photos: [N/A:N/A] Wound Location: Left Toe Great N/A N/A Wounding Event: Gradually Appeared N/A N/A Primary Etiology: Diabetic Wound/Ulcer of the Lower N/A N/A Extremity Comorbid History: Lymphedema, Arrhythmia, N/A N/A Hypertension, Type II Diabetes, End Stage Renal Disease, Rheumatoid Arthritis, Osteoarthritis, Confinement Anxiety Date Acquired: 06/19/2019 N/A N/A Weeks of Treatment: 41 N/A N/A Wound Status: Open N/A N/A Measurements L x W x D (cm) 0.2x0.1x0.4 N/A N/A Area (cm) : 0.016 N/A N/A Volume (cm) : 0.006 N/A N/A % Reduction in Area:  99.40% N/A N/A % Reduction in Volume: 97.80% N/A N/A Starting Position 1 (o'clock): 9 Ending Position 1 (o'clock): 4 Maximum Distance 1 (cm): 0.2 Undermining: Yes N/A N/A Classification: Grade 2 N/A N/A Exudate Amount: Medium N/A N/A Exudate Type: Serous N/A N/A Exudate Color: amber N/A N/A Wound Margin: Thickened N/A N/A Granulation Amount: Medium (34-66%) N/A N/A Granulation Quality: Pale N/A N/A Necrotic Amount: Medium (34-66%) N/A N/A Exposed Structures: Fat Layer (Subcutaneous Tissue): N/A N/A Yes Fascia: No Tendon: No Muscle: No Joint: No Bone: No Epithelialization: Medium (34-66%) N/A N/A Treatment Notes Timothy Gibson ([id number]) Electronic Signature(s) Signed: 04/29/2020 5:56:15 PM By: Timothy Gibson Entered By: Timothy Gibson, BSN, RN, CWS, Kim on 04/29/2020 08:50:24 Timothy Gibson ([id number]) -------------------------------------------------------------------------------- [place] Details Patient Name: Timothy Gibson Date of Service: 04/29/2020 8:15 AM Medical Record Number: [id number] Patient Account Number: [id number] Date of Birth/Sex: 1951/06/23 (69 y.o. M) Treating RN: Timothy Gibson Primary Care Zynia Wojtowicz: Timothy Gibson Other Clinician: Referring Timothy Gibson: Timothy Gibson Treating Timothy Gibson/Extender: Timothy Gibson, Timothy Gibson Weeks in Treatment: 40 Active Inactive Necrotic Tissue Nursing Diagnoses: Impaired tissue integrity related to necrotic/devitalized tissue Goals: Necrotic/devitalized tissue will be minimized in the wound bed Date Initiated: 07/15/2019 Target Resolution Date: 10/18/2019 Goal Status: Active Interventions: Provide education on necrotic tissue and debridement process Notes: Peripheral Neuropathy Nursing Diagnoses: Knowledge deficit related to disease process and management of peripheral neurovascular dysfunction Goals: Patient/caregiver will verbalize understanding of disease process and disease management Date Initiated: 07/15/2019 Target Resolution Date: 10/18/2019 Goal Status: Active Interventions: Assess signs and symptoms of neuropathy upon admission and as needed Provide education on Management of Neuropathy and Related Ulcers Notes: Wound/Skin Impairment Nursing Diagnoses: Impaired tissue integrity Goals: Ulcer/skin breakdown will heal within 14 weeks Date Initiated: 07/15/2019 Target Resolution Date: 10/18/2019 Goal Status: Active Interventions: Assess patient/caregiver ability to obtain necessary supplies Assess patient/caregiver ability to perform ulcer/skin care regimen upon admission and as needed Assess ulceration(s) every visit Notes: Electronic Signature(s) Signed: 04/29/2020 5:56:15 PM By: Timothy Gibson Entered By: Timothy Gibson, BSN, RN, CWS, Kim on 04/29/2020 08:50:11 Timothy Gibson ([id number]) -------------------------------------------------------------------------------- Pain Assessment Details Patient Name: Timothy Gibson Date of Service: 04/29/2020 8:15 AM Medical Record Number: [id number] Patient Account Number: [id number] Date of Birth/Sex: 12/25/1950 (69 y.o. M) Treating RN: Timothy Gibson Primary Care Magen Suriano: Timothy Gibson Other Clinician: Referring Jaheim Canino: Timothy Gibson Treating  Mariesa Grieder/Extender: Timothy Gibson, Timothy Gibson Weeks in Treatment: 25 Active Problems Location of Pain Severity and Description of Pain Patient Has Paino No Site Locations Pain Management and Medication Current Pain Management: Electronic Signature(s) Signed: 05/19/2020 12:05:28 PM By: Timothy Coria RN Entered By: Timothy Gibson on 04/29/2020 08:35:10 Timothy Gibson ([id number]) -------------------------------------------------------------------------------- Patient/Caregiver Education Details Patient Name: Timothy Gibson Date of Service: 04/29/2020 8:15 AM Medical Record Number: [id number] Patient Account Number: [id number] Date of Birth/Gender: May 07, 1951 (69 y.o. M) Treating RN: Timothy Gibson Primary Care Physician: Timothy Gibson Other Clinician: Referring Physician: Merrie Gibson Treating Physician/Extender: Sharalyn Ink in Treatment: 35 Education Assessment Education Provided To: Patient Education Topics Provided Pressure: Handouts: Pressure Ulcers: Care and Offloading Methods: Demonstration, Explain/Verbal Responses: State content correctly Wound/Skin Impairment: Handouts: Caring for Your Ulcer Methods: Demonstration, Explain/Verbal Responses: State content correctly Electronic Signature(s) Signed: 04/29/2020 5:56:15 PM By: Timothy Gibson Entered By: Timothy Gibson, BSN, RN, CWS, Kim on 04/29/2020 08:53:22 ZAHMIR, LALLA ([id number]) -------------------------------------------------------------------------------- Wound Assessment Details Patient Name: Timothy Gibson Date of Service: 04/29/2020 8:15 AM Medical  Record Number: [id number] Patient Account Number: [id number] Date of Birth/Sex: May 07, 1951 (69 y.o. M) Treating RN: Timothy Gibson Primary Care Tynetta Bachmann: Timothy Gibson Other Clinician: Referring Jameson Morrow: Timothy Gibson Treating Ritvik Mczeal/Extender: Timothy Gibson, Timothy Gibson Weeks in Treatment: 41 Wound Status Wound Number: 3 Primary Diabetic Wound/Ulcer of the  Lower Extremity Etiology: Wound Location: Left Toe Great Wound Open Wounding Event: Gradually Appeared Status: Date Acquired: 06/19/2019 Comorbid Lymphedema, Arrhythmia, Hypertension, Type II Weeks Of Treatment: 41 History: Diabetes, End Stage Renal Disease, Rheumatoid Arthritis, Clustered Wound: No Osteoarthritis, Confinement Anxiety Photos Wound Measurements Length: (cm) 0.2 Width: (cm) 0.1 Depth: (cm) 0.4 Area: (cm) 0.016 Volume: (cm) 0.006 % Reduction in Area: 99.4% % Reduction in Volume: 97.8% Epithelialization: Medium (34-66%) Tunneling: No Undermining: Yes Starting Position (o'clock): 9 Ending Position (o'clock): 4 Maximum Distance: (cm) 0.2 Wound Description Classification: Grade 2 Wound Margin: Thickened Exudate Amount: Medium Exudate Type: Serous Exudate Color: amber Foul Odor After Cleansing: No Slough/Fibrino Yes Wound Bed Granulation Amount: Medium (34-66%) Exposed Structure Granulation Quality: Pale Fascia Exposed: No Necrotic Amount: Medium (34-66%) Fat Layer (Subcutaneous Tissue) Exposed: Yes Necrotic Quality: Adherent Slough Tendon Exposed: No Muscle Exposed: No Joint Exposed: No Bone Exposed: No Electronic Signature(s) Signed: 05/19/2020 12:05:28 PM By: Timothy Coria RN Entered By: Timothy Gibson on 04/29/2020 08:41:40 Timothy Gibson ([id number]) SOLOMON, SKOWRONEK ([id number]) -------------------------------------------------------------------------------- Vitals Details Patient Name: Timothy Gibson Date of Service: 04/29/2020 8:15 AM Medical Record Number: [id number] Patient Account Number: [id number] Date of Birth/Sex: April 26, 1951 (69 y.o. M) Treating RN: Timothy Gibson Primary Care Jozee Hammer: Timothy Gibson Other Clinician: Referring Grayden Burley: Timothy Gibson Treating Laketia Vicknair/Extender: Timothy Gibson, Timothy Gibson Weeks in Treatment: 97 Vital Signs Time Taken: 08:33 Temperature (F): 98.2 Height (in): 76 Pulse (bpm): 75 Weight (lbs):  244 Respiratory Rate (breaths/min): 20 Body Mass Index (BMI): 29.7 Blood Pressure (mmHg): 187/80 Reference Range: 80 - 120 mg / dl Electronic Signature(s) Signed: 05/19/2020 12:05:28 PM By: Timothy Coria RN Entered By: Timothy Gibson on 04/29/2020 08:35:03

## 2020-05-20 ENCOUNTER — Other Ambulatory Visit: Payer: Self-pay

## 2020-05-20 ENCOUNTER — Encounter: Payer: Medicare Other | Admitting: Physician Assistant

## 2020-05-20 DIAGNOSIS — L97522 Non-pressure chronic ulcer of other part of left foot with fat layer exposed: Secondary | ICD-10-CM | POA: Diagnosis not present

## 2020-05-20 DIAGNOSIS — Z794 Long term (current) use of insulin: Secondary | ICD-10-CM | POA: Diagnosis not present

## 2020-05-20 DIAGNOSIS — E11621 Type 2 diabetes mellitus with foot ulcer: Secondary | ICD-10-CM | POA: Diagnosis not present

## 2020-05-20 DIAGNOSIS — M86672 Other chronic osteomyelitis, left ankle and foot: Secondary | ICD-10-CM | POA: Diagnosis not present

## 2020-05-20 NOTE — Progress Notes (Signed)
EYTAN, CARRIGAN ([id number]) Visit Report for 05/20/2020 Arrival Information Details Patient Name: NAJEH, CREDIT Date of Service: 05/20/2020 8:00 AM Medical Record Number: [id number] Patient Account Number: [id number] Date of Birth/Sex: [birth year] (69 y.o. M) Treating RN: Dolan Amen Primary Care Teja Costen: Merrie Roof Other Clinician: Referring Kaled Allende: Merrie Roof Treating Elisandro Jarrett/Extender: Skipper Cliche in Treatment: 14 Visit Information History Since Last Visit Pain Present Now: No Patient Arrived: Ambulatory Arrival Time: 08:05 Accompanied By: friend Transfer Assistance: None Patient Identification Verified: Yes Secondary Verification Process Completed: Yes Patient Requires Transmission-Based Precautions: No Patient Has Alerts: No Electronic Signature(s) Signed: 05/20/2020 8:47:05 AM By: Georges Mouse, Minus Breeding Entered By: Georges Mouse, Minus Breeding on 05/20/2020 08:06:22 [name] ([id number]) -------------------------------------------------------------------------------- Encounter Discharge Information Details Patient Name: [name] Date of Service: 05/20/2020 8:00 AM Medical Record Number: [id number] Patient Account Number: [id number] Date of Birth/Sex: 12/04/1950 (69 y.o. M) Treating RN: Carlene Coria Primary Care Aishani Kalis: Merrie Roof Other Clinician: Referring Mikaele Stecher: Merrie Roof Treating Zaylan Kissoon/Extender: Skipper Cliche in Treatment: 26 Encounter Discharge Information Items Post Procedure Vitals Discharge Condition: Stable Temperature (F): 97.9 Ambulatory Status: Cane Pulse (bpm): 88 Discharge Destination: Home Respiratory Rate (breaths/min): 18 Transportation: Private Auto Blood Pressure (mmHg): 193/101 Accompanied By: caregiver Schedule Follow-up Appointment: Yes Clinical Summary of Care: Electronic Signature(s) Signed: 05/20/2020 5:01:08 PM By: Carlene Coria RN Entered By: Carlene Coria on 05/20/2020 10:16:17 [name] ([id number]) -------------------------------------------------------------------------------- Lower Extremity Assessment Details Patient Name: [name] Date of Service: 05/20/2020 8:00 AM Medical Record Number: [id number] Patient Account Number: [id number] Date of Birth/Sex: Jul 15, 1950 (69 y.o. M) Treating RN: Dolan Amen Primary Care Ninamarie Keel: Merrie Roof Other Clinician: Referring Leeasia Secrist: Merrie Roof Treating Arlie Riker/Extender: Skipper Cliche in Treatment: 44 Edema Assessment Assessed: [Left: Yes] [Right: No] Edema: [Left: Ye] [Right: s] Calf Left: Right: Point of Measurement: 36 cm From Medial Instep 42 cm Ankle Left: Right: Point of Measurement: 12 cm From Medial Instep 31 cm Vascular Assessment Pulses: Dorsalis Pedis Palpable: [Left:Yes] Electronic Signature(s) Signed: 05/20/2020 8:47:05 AM By: Georges Mouse, Minus Breeding Entered By: Georges Mouse, Minus Breeding on 05/20/2020 08:16:00 [name] ([id number]) -------------------------------------------------------------------------------- Multi Wound Chart Details Patient Name: [name] Date of Service: 05/20/2020 8:00 AM Medical Record Number: [id number] Patient Account Number: [id number] Date of Birth/Sex: March 18, 1951 (69 y.o. M) Treating RN: Carlene Coria Primary Care Radie Berges: Merrie Roof Other Clinician: Referring Treyton Slimp: Merrie Roof Treating Louise Victory/Extender: Skipper Cliche in Treatment: 44 Vital Signs Height(in): 76 Pulse(bpm): 88 Weight(lbs): 244 Blood Pressure(mmHg): Body Mass Index(BMI): 30 Temperature(F): 97.9 Respiratory Rate(breaths/min): 18 Photos: [N/A:N/A] Wound Location: Left Toe Great N/A N/A Wounding Event: Gradually Appeared N/A N/A Primary Etiology: Diabetic Wound/Ulcer of the Lower N/A N/A Extremity Comorbid History: Lymphedema, Arrhythmia, N/A N/A Hypertension, Type II Diabetes, End Stage Renal Disease, Rheumatoid Arthritis,  Osteoarthritis, Confinement Anxiety Date Acquired: 06/19/2019 N/A N/A Weeks of Treatment: 44 N/A N/A Wound Status: Open N/A N/A Measurements L x W x D (cm) 0.1x0.1x0.1 N/A N/A Area (cm) : 0.008 N/A N/A Volume (cm) : 0.001 N/A N/A % Reduction in Area: 99.70% N/A N/A % Reduction in Volume: 99.60% N/A N/A Classification: Grade 2 N/A N/A Exudate Amount: None Present N/A N/A Wound Margin: Thickened N/A N/A Granulation Amount: None Present (0%) N/A N/A Necrotic Amount: Large (67-100%) N/A N/A Necrotic Tissue: Eschar N/A N/A Exposed Structures: Fat Layer (Subcutaneous Tissue): N/A N/A Yes Fascia: No Tendon: No Muscle: No Joint: No Bone: No Epithelialization: Small (1-33%) N/A N/A Treatment Notes Electronic Signature(s) Signed: 05/20/2020 5:01:08 PM By:  Carlene Coria RN Entered By: Carlene Coria on 05/20/2020 08:18:45 [name] ([id number]) -------------------------------------------------------------------------------- [place] Details Patient Name: SEAB, AXEL Date of Service: 05/20/2020 8:00 AM Medical Record Number: [id number] Patient Account Number: [id number] Date of Birth/Sex: [birth year] (69 y.o. M) Treating RN: Carlene Coria Primary Care Arriyana Rodell: Merrie Roof Other Clinician: Referring Celita Aron: Merrie Roof Treating Porche Steinberger/Extender: Skipper Cliche in Treatment: 44 Active Inactive Necrotic Tissue Nursing Diagnoses: Impaired tissue integrity related to necrotic/devitalized tissue Goals: Necrotic/devitalized tissue will be minimized in the wound bed Date Initiated: 07/15/2019 Target Resolution Date: 10/18/2019 Goal Status: Active Interventions: Provide education on necrotic tissue and debridement process Notes: Peripheral Neuropathy Nursing Diagnoses: Knowledge deficit related to disease process and management of peripheral neurovascular dysfunction Goals: Patient/caregiver will verbalize understanding of disease process and  disease management Date Initiated: 07/15/2019 Target Resolution Date: 10/18/2019 Goal Status: Active Interventions: Assess signs and symptoms of neuropathy upon admission and as needed Provide education on Management of Neuropathy and Related Ulcers Notes: Wound/Skin Impairment Nursing Diagnoses: Impaired tissue integrity Goals: Ulcer/skin breakdown will heal within 14 weeks Date Initiated: 07/15/2019 Target Resolution Date: 10/18/2019 Goal Status: Active Interventions: Assess patient/caregiver ability to obtain necessary supplies Assess patient/caregiver ability to perform ulcer/skin care regimen upon admission and as needed Assess ulceration(s) every visit Notes: Electronic Signature(s) Signed: 05/20/2020 5:01:08 PM By: Carlene Coria RN Entered By: Carlene Coria on 05/20/2020 08:18:34 [name] ([id number]) -------------------------------------------------------------------------------- Pain Assessment Details Patient Name: [name] Date of Service: 05/20/2020 8:00 AM Medical Record Number: [id number] Patient Account Number: [id number] Date of Birth/Sex: [birth year] (69 y.o. M) Treating RN: Dolan Amen Primary Care Sheyli Horwitz: Merrie Roof Other Clinician: Referring Durrell Barajas: Merrie Roof Treating Aashrith Eves/Extender: Skipper Cliche in Treatment: 23 Active Problems Location of Pain Severity and Description of Pain Patient Has Paino No Site Locations Rate the pain. Current Pain Level: 0 Pain Management and Medication Current Pain Management: Electronic Signature(s) Signed: 05/20/2020 8:47:05 AM By: Georges Mouse, Kenia Entered By: Georges Mouse, Minus Breeding on 05/20/2020 08:09:03 [name] ([id number]) -------------------------------------------------------------------------------- Wound Assessment Details Patient Name: [name] Date of Service: 05/20/2020 8:00 AM Medical Record Number: [id number] Patient Account Number: [id number] Date of  Birth/Sex: 1951-01-10 (69 y.o. M) Treating RN: Dolan Amen Primary Care Nyko Gell: Merrie Roof Other Clinician: Referring Mihran Lebarron: Merrie Roof Treating Milanni Ayub/Extender: Skipper Cliche in Treatment: 44 Wound Status Wound Number: 3 Primary Diabetic Wound/Ulcer of the Lower Extremity Etiology: Wound Location: Left Toe Great Wound Open Wounding Event: Gradually Appeared Status: Date Acquired: 06/19/2019 Comorbid Lymphedema, Arrhythmia, Hypertension, Type II Weeks Of Treatment: 44 History: Diabetes, End Stage Renal Disease, Rheumatoid Arthritis, Clustered Wound: No Osteoarthritis, Confinement Anxiety Photos Wound Measurements Length: (cm) 0.1 Width: (cm) 0.1 Depth: (cm) 0.1 Area: (cm) 0.008 Volume: (cm) 0.001 % Reduction in Area: 99.7% % Reduction in Volume: 99.6% Epithelialization: Small (1-33%) Tunneling: No Undermining: No Wound Description Classification: Grade 2 Fo Wound Margin: Thickened Sl Exudate Amount: None Present ul Odor After Cleansing: No ough/Fibrino No Wound Bed Granulation Amount: None Present (0%) Exposed Structure Necrotic Amount: Large (67-100%) Fascia Exposed: No Necrotic Quality: Eschar Fat Layer (Subcutaneous Tissue) Exposed: Yes Tendon Exposed: No Muscle Exposed: No Joint Exposed: No Bone Exposed: No Treatment Notes Wound #3 (Left Toe Great) 1. Cleansed with: Clean wound with Normal Saline Notes Gentamycin cream, gauze, conform, netting ,offloading shoe Electronic Signature(s) SELIM, DURDEN ([id number]) Signed: 05/20/2020 8:47:05 AM By: Georges Mouse, Minus Breeding Entered By: Georges Mouse, Minus Breeding on 05/20/2020 08:14:13 [name] ([id number]) -------------------------------------------------------------------------------- Vitals Details Patient  Name: [name] Date of Service: 05/20/2020 8:00 AM Medical Record Number: [id number] Patient Account Number: [id number] Date of Birth/Sex: Feb 10, 1951 (69 y.o.  M) Treating RN: Dolan Amen Primary Care Brooklen Runquist: Merrie Roof Other Clinician: Referring Yanisa Goodgame: Merrie Roof Treating Delesa Kawa/Extender: Skipper Cliche in Treatment: 44 Vital Signs Time Taken: 08:06 Temperature (F): 97.9 Height (in): 76 Pulse (bpm): 88 Weight (lbs): 244 Respiratory Rate (breaths/min): 18 Body Mass Index (BMI): 29.7 Blood Pressure (mmHg): 193/101 Reference Range: 80 - 120 mg / dl Notes Maude Hettich made aware of elevated BP Electronic Signature(s) Signed: 05/20/2020 8:45:56 AM By: Georges Mouse, Minus Breeding Entered By: Georges Mouse, Minus Breeding on 05/20/2020 08:45:56

## 2020-05-20 NOTE — Progress Notes (Addendum)
Timothy Gibson, Timothy Gibson ([id number]) Visit Report for 05/20/2020 Chief Complaint Document Details Patient Name: Timothy Gibson, Timothy Gibson Date of Service: 05/20/2020 8:00 AM Medical Record Number: [id number] Patient Account Number: [id number] Date of Birth/Sex: 1951/01/27 (69 y.o. M) Treating RN: Carlene Coria Primary Care Provider: Merrie Roof Other Clinician: Referring Provider: Merrie Roof Treating Provider/Extender: Skipper Cliche in Treatment: 66 Information Obtained from: Patient Chief Complaint Left Great Toe Electronic Signature(s) Signed: 05/20/2020 8:13:10 AM By: Worthy Keeler PA-C Entered By: Worthy Keeler on 05/20/2020 08:13:09 Timothy Gibson ([id number]) -------------------------------------------------------------------------------- Debridement Details Patient Name: Timothy Gibson Date of Service: 05/20/2020 8:00 AM Medical Record Number: [id number] Patient Account Number: [id number] Date of Birth/Sex: 1951/07/07 (69 y.o. M) Treating RN: Carlene Coria Primary Care Provider: Merrie Roof Other Clinician: Referring Provider: Merrie Roof Treating Provider/Extender: Skipper Cliche in Treatment: 44 Debridement Performed for Wound #3 Left Toe Great Assessment: Performed By: Physician Tommie Sams., PA-C Debridement Type: Debridement Severity of Tissue Pre Debridement: Fat layer exposed Level of Consciousness (Pre- Awake and Alert procedure): Pre-procedure Verification/Time Out Yes - 08:19 Taken: Start Time: 08:19 Pain Control: Lidocaine 5% topical ointment Total Area Debrided (L x W): 0.1 (cm) x 0.1 (cm) = 0.01 (cm) Tissue and other material Viable, Non-Viable, Slough, Subcutaneous, Skin: Dermis , Skin: Epidermis, Slough debrided: Level: Skin/Subcutaneous Tissue Debridement Description: Excisional Instrument: Curette Bleeding: Minimum Hemostasis Achieved: Pressure End Time: 08:23 Procedural Pain: 0 Post Procedural Pain: 0 Response to Treatment:  Procedure was tolerated well Level of Consciousness (Post- Awake and Alert procedure): Post Debridement Measurements of Total Wound Length: (cm) 0.2 Width: (cm) 0.3 Depth: (cm) 0.1 Volume: (cm) 0.005 Character of Wound/Ulcer Post Debridement: Improved Severity of Tissue Post Debridement: Fat layer exposed Post Procedure Diagnosis Same as Pre-procedure Electronic Signature(s) Signed: 05/20/2020 4:18:53 PM By: Worthy Keeler PA-C Signed: 05/20/2020 5:01:08 PM By: Carlene Coria RN Entered By: Carlene Coria on 05/20/2020 08:25:28 Timothy Gibson ([id number]) -------------------------------------------------------------------------------- HPI Details Patient Name: Timothy Gibson Date of Service: 05/20/2020 8:00 AM Medical Record Number: [id number] Patient Account Number: [id number] Date of Birth/Sex: March 21, 1951 (69 y.o. M) Treating RN: Carlene Coria Primary Care Provider: Merrie Roof Other Clinician: Referring Provider: Merrie Roof Treating Provider/Extender: Skipper Cliche in Treatment: 63 History of Present Illness HPI Description: 11/27/17 on evaluation today patient presents for initial evaluation concerning an issue that he has been having with his right great toe which began last Thursday 11/22/17. He has a history of diabetes mellitus type to which he has had for greater than 30 years currently he is on insulin. Subsequently he also has a history of hypertension. On physical exam inspection is also appears he may have some peripheral vascular disease with his ABI's being noncompressible bilaterally. He is on dialysis as well and this is on Monday, Wednesday, and Friday. Patient was hospitalized back in January/February 2019 although this was more for stomach/back pain though they never told me exactly what was going on. This is according to the patient. Subsequently the ulcer which is between the third and fourth toe when space of the right foot as well is on the plantar  aspect of the fourth toe is due to him having cleaned his toes this morning and he states that his finger which is large split the toe tissue in between causing the wounds that we currently see. With that being said he states this has happened before I explained I would definitely recommend that he not do this any longer. He can use a  small soft washcloth to clean between the toes without causing trauma. Subsequently the right great toe hatchery does show evidence of necrotic tissue present on the surface of the wound specifically there is callous and dead skin surrounding which is trapping fluid causing problems as far as the wound is concerned. The surface of the wound does show slough although due to his vascular flow I'm not going to sharply debride this today I think I will selectively debride away the necrotic skin as well is callous from around the surrounding so this will not continue to be a moisture issue. Nonetheless the patient has no pain he does have diabetic neuropathy. 12/06/17-He is here in follow-up evaluation for a right great toe ulcer. He is voicing no complaints or concerns. He continues to infrequently/socially smokes cigars with no desire to quit. He has been compliant with offloading, using open toed surgical shoe; has been compliant using Santyl daily. He continues on clindamycin although takes it inconsistently secondary to indigestion. The plain film x-ray performed on 5/23 impression: Soft tissue swelling of the left first toe and is noted with probable irregular lucency involving distal tuft of first distal phalanx concerning for osteomyelitis. MRI may be performed for further evaluation; MRI ordered. The vascular evaluation performed on 5/24 field non- compressible ABIs bilaterally and reduced TBI bilaterally suggesting significant tibial disease. He will be referred to vascular medicine for further evaluation. 12/13/17-He is here in follow-up evaluation for right great toe  ulcer. He has appointment next Thursday for the MRI and vascular evaluation. Wound culture obtained last week grew abundant Klebsiella oxytoca (multidrug sensitivity), and abundant enteric coccus faecalis (sensitive to ampicillin). Ampicillin and Cipro were called in, along with a probiotic. In light of his appointments next Thursday he will follow-up in 2 weeks, we will continue with Santyl 12/27/17-He is here for evaluation for a right great toe ulcer. 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 He did have evaluation by vascular medicine, Dr. Ronalee Belts, on 6/13, at that appointment it was decided for him to undergo angiography with possible intervention but he refused and is still considering. If he chooses to have it done he will contact the office. He has not heard back from either ID offices that he was referred to two weeks ago: [place] and [place]. there is improvement in both appearance and measurement to the ulcer. We will extend antibiotic therapy (amoxicillin 500 every 12, Cipro 500 daily) for additional 2 weeks pending infectious disease consult, he will continue with Santyl daily. He was reminded to continue with probiotic therapy while on oral antibiotics; he denies any GI disturbance. 01/03/18-He is here in follow-up evaluation for right great toe ulcer. He is decided to not undergo any vascular intervention at this time. He was established with [facility] infectious disease (Dr. Linus Salmons) last week initiated on vancomycin and cefazolin with dialysis treatment for 6 weeks. We will continue with current treatment plan with Santyl and offloading and he will follow-up in 2 weeks 01/17/18-He is seen in follow-up evaluation for right great toe ulcer. There is improvement in appearance with less nonviable tissue present. We will continue with same treatment plan he will follow-up next week. He is tolerating IV antibiotics  without any complications 9/70/26-VZ is seen in follow up evaluation for right great toe ulcer. There continues to be improvement. He is tolerating IV antibiotics with hemodialysis, completion date of 7/31. We will transition to collagen and and  follow-up next week 02/07/18-He is here in follow up for a right great toe ulcer. There is red granulation tissue throughout the wound, improved in appearance. He completed antibiotics yesterday. He saw podiatry last Thursday for nail trimming, at that appointment he periwound callus was trimmed and a culture was obtained; culture negative. He does not have a follow up appointment with infectious disease. We will submit for grafix. 02/14/18-He is here in follow-up evaluation for a right great toe ulcer. There is slow improvement, red granulation tissue throughout. The insurance approval for grafix is pending. We will continue with collagen and he'll follow-up next week 02/21/18-He is seen in follow-up evaluation for right great toe ulcer. There is improvement with red granulation tissue throughout. He was approved for oasis and this was placed today. He will follow up next week 02/28/18-He is seen in follow-up evaluation for right great toe ulcer. The wound is stable, oasis applied and he'll follow-up next week 03/07/18-He is seen in follow-up violation of her right great toe ulcer. The wound is dry, no evidence of drainage. The wound appears healed today. He was painted with Betadine, instructed to paint with Betadine daily, cover with band-aid for the next week and then cover with band-aid for the following week continue with surgical shoe. He was encouraged to contact the clinic with any evidence of drainage, or change in appearance to toe/wound. He will be discharged from wound care services Readmission: 07/14/18 on evaluation today patient presents for initial evaluation our clinic concerning issues that he is having with his left great toe. We previously saw  him in 2019 for his right great toe which was actually worse at that time and subsequently was able to be completely healed in the end although it did take several months. With that being said the patient is currently having an area on his left great toe that occurred initially as result of a blister that came up he's not really sure why or how. Subsequently this was initially treated by his podiatrist though the recommendation there was apparently amputation according to the patient and his daughter who was present with him today. With that being said the patient did not want to proceed with amputation and would like to try to perform wound care and get this to heal without having to go down that road. We were able to do that with his other toe and so he would like to at least give that a try before going forward with amputation. Again I completely understand his concern in this regard. With that being said I did discuss with the patient obviously that it's always a chance that we cannot get this to heal with normal wound care measures but obviously we will give it our best shot. He does actually have an appointment scheduled for later today with the vascular specialist in order to evaluate his blood flow. That was at the recommendation of his podiatrist as well. Obviously if he is having good arterial flow and nonetheless is still experiencing issues with having the wound delay in healing then it may simply be a matter of we need to initiate more aggressive wound to therapy in order to see if we can get this to heal. No fevers, chills, Timothy Gibson, Timothy Gibson. ([id number]) nausea, or vomiting noted at this time. The patient notes that this woman has been present for approximately three weeks. He is not a smoker. His most recent hemoglobin A1c was 6.2 on 06/18/19. 07/17/2019 on evaluation today patient appears  unfortunately to not be doing as well. He did see vascular I did review the note as well. That was  on 07/15/2019. Subsequently there can actually be taking him to the OR for an angiogram due to poor arterial flow into this extremity. Apparently the atherosclerotic changes were severe. The patient is stated to be at risk for limb loss. Nonetheless the good news is this is being scheduled fairly quickly he will have the surgery/procedure next Thursday. With that being said I am going also go ahead based on the results of his x-ray which showed some chance of osteomyelitis in the distal portion of the toe get the patient set up for an MRI to further evaluate for the possibility of osteomyelitis obviously if he does have osteomyelitis we need to know so that we can address this appropriately. 07/29/2019 upon evaluation today patient appears to be doing better in general with regard to his toe ulcer. He does have much better blood flow you can actually feel a palpable pulse which appears to be very strong at this time. I did review his arterial intervention/angiogram which did show that he had evidence of sufficient arterial flow restoration at this point. He did have occlusions that were 60 and 80% that residually were only 10 and 25% with no limiting flow. This is excellent news and hopefully he will continue to show signs of improvement in light of the improved vascular status. With that being said the toe mainly shows somewhat of an eschar today I think that this is fairly stable and as such probably would be best not to really do much in the way of debridement at this point I do believe Betadine would likely be a good option. 08/07/2019 on evaluation today patient actually appears to be doing about the same. The one difference is the eschar that we were just seeing if we can maintain is actually starting to loosen up and leak around the edges I think it is time to actually go ahead and remove this today. Is also no longer stable is very soft. Nonetheless the patient is okay with proceeding with  debridement at this point. Fortunately there is no signs of active infection. No fevers, chills, nausea, vomiting, or diarrhea. The patient states that he took his last antibiotic that he had last night. He would need a refill as he will not be seeing infectious disease until February 2. Nonetheless obviously I do not want him to have any break in therapy especially when he is doing as well as he is currently. 08/14/2019 on evaluation today patient appears to be doing quite well all things considered in regard to his toe. I do feel like he is making progress and though this is not completely healed by any means we still have some ways to go I feel like he is making progress which is excellent. There is no sign of active infection at this time systemically. He does have osteomyelitis of the distal phalanx of his great toe on the left. He did see Dr. Linus Salmons. That is the infectious disease doctor in [place] where he was referred. Dr. Linus Salmons apparently according to the note did not feel like the patient's MRI revealed osteomyelitis but just reactive marrow and subsequently did not recommend any antibiotic therapy whatsoever. To be peripherally honest I am not really in agreement with this based on the MRI results and what we are seeing and I think he has been responding at least decently well to oral medication as  well. Nonetheless we may want to check into the possibility of IV antibiotics being administered at the dialysis center. I will discuss this with Dr. Dellia Nims further. 08/21/2019 upon evaluation today patient appears at this point to be making some progress with regard to his wounds. He has been tolerating the dressing changes without complication. Fortunately there is no evidence of active infection at this time. Overall I feel like he is doing well with the oral antibiotics I discussed with Dr. Dellia Nims the possibility of doing IV antibiotic therapy versus continuing with the oral. His opinion was if  the patient is doing well with oral to maybe stick with that. Obviously we can initiate additional Cipro as well just to help ensure that there is nothing worsening here. The Cipro should be beneficial for him as well. That helps cover more gram-negative's were is at the doxycycline is more gram- positive's. 09/02/2019 on evaluation today patient appears to be doing fairly well upon inspection today. With that being said though the patient is continuing to do well he does also continue to develop issues with buildup of necrotic tissue on the distal portion of his toe he does have good arterial flow however. For that reason I am going to suggest that what we may need to do is consider using something such as Santyl to try to keep this free and clear. Also discussed in greater detail HBO therapy with him today please see plan for additional recommendations and results of this discussion. 09/11/2019 upon evaluation today patient appears to be doing well with regard to his toe ulcer. This actually appears to be much better today using the Santyl than it was previous. Fortunately there is no signs of active infection. No fevers, chills, nausea, vomiting, or diarrhea. 09/18/2019 upon evaluation today patient appears to be doing much better in regard to his wound in general. The Santyl was doing a good job at loosening stuff up here for [place] which is excellent news. With that being said he is still taking the oral antibiotic. I sent this in last for him on 09/03/2019. This was the doxycycline. When this runs out he will actually be complete as far as the treatment is concerned. With that being said I am getting continue the Cipro for 1 additional month. Overall his wound seems to be doing quite well. 09/25/2019 upon evaluation today patient appears to be doing well with regard to his toe ulcer. This is measuring much better and overall seems to be making good progress. Fortunately there is no signs of active  infection at this time. No fevers, chills, nausea, vomiting, or diarrhea. 10/02/2019 upon evaluation today patient appears to be doing well with regard to his wound. He has been tolerating the dressing changes without complication. Fortunately there is no signs of active infection at this time. No fevers, chills, nausea, vomiting, or diarrhea. Overall I am extremely pleased with how things seem to be progressing. I think the wound bed is at the point now where we can likely proceed with additional measures to try to get this healed more quickly I think Dermagraft may be a good possibility for the patient. We can definitely look into seeing about approval for this. 10/09/2019 upon evaluation today patient's wound actually appears to be doing well although it still is progressing somewhat slowly compared to what I would like to see. With that being said I did order Apligraf for the patient which I felt would keep the wound nice and moist without allowing  it to dry out and hopefully allow this to heal much more effectively and quickly. The patient actually did obtain approval and therefore we can apply that today. 10/23/2019 upon evaluation today patient's wound actually appears to be doing significantly better with regard to his toe ulcer. The tissue at the base of the wound is greatly improved and overall I am very pleased with the progress made. We did use Apligraf 2 weeks ago on the toe that did excellent for him much it has completely dissolved after as of the 2 weeks. They only had to change the dressing a few times due to drainage through. Obviously they left the Steri-Strips and Mepitel in place. Unfortunately we do not have the Apligraf available today for application regard therefore have to use a different product here in the clinic today and then order the Apligraf for next Thursday. That will be his second application. 4/22; patient arrived with overhanging skin and subcutaneous tissue on the  wound margins this was removed. Apligraf #2 applied 11/13/2019 upon evaluation today patient actually appears to be doing well with regard to his foot ulcer. He has been tolerating the dressing changes without complication. The overall wound bed appears to be healthier and has filled in as well as not nearly as deep as it was the tissue is also better quality. Overall I am extremely pleased with how things seem to be progressing. The patient is likewise very happy in this regard. 5/20; wound is measuring smaller had Apligraf #3 last time. Apligraf #4 today Timothy Gibson, Timothy Gibson ([id number]) 12/12/2019 upon evaluation today patient actually appears to be doing quite well with regard to his wound in my opinion. The ulcer is significantly smaller compared to last time I saw him although that has been several weeks. I do believe the Apligraf has been beneficial he has 1 remaining today this will be application #5. 0/32/1224 upon evaluation today patient appears to be doing well with regard to his toe ulcer. He is measuring about the same today but he has come a long way from where this started. Fortunately there is no evidence of active infection which is great news and overall I am very pleased with the progress that is been made. Nonetheless at this point we have completed the Apligraf course he did see improvement through this but I am going to switch to something different as of today. 01/08/2020 upon evaluation today patient appears to be doing much better in regards to his toe ulcer this is measuring smaller and overall very pleased with the progress. The Hydrofera Blue seems to be doing a great job. Fortunately there is no evidence of active infection at this time. No fevers, chills, nausea, vomiting, or diarrhea. 01/29/2020 upon evaluation today patient appears to be doing okay in regard to his toe ulcer. The main issue I see is that he actually has trouble right now with callus growing and covering over  the wound bed. This prevents him from being able to get a dressing in contact with the wound bed and subsequently fluid collection underneath. I think that we probably need to see him more frequently to keep this pared down and debrided to the point to allow the dressings to effectively heal this wound. I discussed that with the patient today. 02/05/2020 on evaluation today patient appears to be doing well with regard to his wounds currently. In fact he just has 1 on his toe remaining at this point he seems to be doing quite well in  my opinion. I am very pleased with the overall appearance today. There is no signs of active infection I do think keeping the callus trimmed down has been of great benefit as far as healing is concerned. I do not think we need to go 3 weeks out like we were in the past I believe that is been somewhat more detrimental than good for him. 02/19/2020 upon evaluation today patient appears to be doing better in regard to his toe ulcer still there is callus buildup around the top of the toe which unfortunately is I think causing some issues here with trapping a little fluid underneath them after cleaning this away. Fortunately there is no evidence of active infection at this time. Overall I feel like the patient is doing quite well which is great news. 03/04/2020 on evaluation today patient appears to be doing well with regard to his toe ulcer though he still has a lot of drying out of the endoform and a lot of callus buildup as well unfortunately. With that being said I think that he is can require some sharp debridement to clear away some of the callus today. We will see what the wound looks like following. 03/11/2020 on evaluation today patient appears to be doing excellent in regard to his toe ulcer. He did have a lot of callus and even some of the nail, growing over top of the area where the wound is trying to heal. For that reason I did actually trim back some of the nail also  did go ahead and trim away some of the callus as well. He tolerated all this without complication the wound appears to be doing well and is showing no signs of infection at this point. Overall I am very pleased. 03/18/2020 upon evaluation today patient appears to be doing well with regard to his toe ulcer. There does not appear to be any significant buildup of callus at this point and overall I do believe that the alginate has been beneficial. Trimming back the toenail also seems to have helped a lot in this regard I think that was causing some rubbing on the end of the toe. Fortunately overall he is doing much better unfortunately he notes that his grandson has been placed on hospice he is 57 and apparently not doing too well. He wants to make a trip to go see him I really think that something he probably should do and not wait for this to try to heal. 04/01/2020 upon evaluation today patient's toe ulcer appears to be doing about the same. He does have some callus buildup slowly he is making progress but were not completely closed as of yet. I am going to need to perform some debridement today to clear this way. 04/15/2020 on evaluation today patient appears to be doing well in general in regard to his wounds there is no signs of active infection in regard to the toe obviously although he does have a lot of drainage which does raise a bit of a question in that regard. I did discuss with him possibilities for we may want to do treatment wise to try to help out with this. With that being said I Georgina Peer probably start with a topical antibiotic and continuing with the current dressing for the time being. 04/29/2020 on evaluation today patient appears to be doing decently well in regard to his toe ulcer. There is no signs of active infection at this time. Fortunately I believe that the patient has made some  progress since last time I saw him. Still this is taken much longer than I would like to see obviously  the patient is ready for this to be done. 05/13/2020 upon evaluation today patient appears to be showing some signs of improvement with regard to his toe ulcer this is great news and overall very pleased in that regard. There is no evidence of active infection currently which is great news as well. No fevers, chills, nausea, vomiting, or diarrhea. 05/20/2020 on evaluation today patient appears to be doing actually very well in regard to his toe ulcer. He did have some dry skin buildup on the surface of the wound along with some drainage and dressing material. I remove this and the wound underneath appears to be doing significantly better. There is a lot of new skin growth and I am very pleased. With that being said I think that we may want to just use a little bit of antibiotic cream over this for the next week and see if we can get this to close. Electronic Signature(s) Signed: 05/20/2020 1:57:21 PM By: Worthy Keeler PA-C Entered By: Worthy Keeler on 05/20/2020 13:57:20 Timothy Gibson, Timothy Gibson ([id number]) -------------------------------------------------------------------------------- Physical Exam Details Patient Name: Timothy Gibson Date of Service: 05/20/2020 8:00 AM Medical Record Number: [id number] Patient Account Number: [id number] Date of Birth/Sex: [birth year] (69 y.o. M) Treating RN: Carlene Coria Primary Care Provider: Merrie Roof Other Clinician: Referring Provider: Merrie Roof Treating Provider/Extender: Skipper Cliche in Treatment: 48 Constitutional Well-nourished and well-hydrated in no acute distress. Respiratory normal breathing without difficulty. Psychiatric this patient is able to make decisions and demonstrates good insight into disease process. Alert and Oriented x 3. pleasant and cooperative. Notes Patient's wound did require some sharp debridement to remove minimal slough, skin, and dressing material down to good subcutaneous tissue which he tolerated without  any pain there was minimal bleeding. Post debridement this actually looks to be the best that have seen in a very long time. I think is very close to complete closure. Electronic Signature(s) Signed: 05/20/2020 2:01:15 PM By: Worthy Keeler PA-C Entered By: Worthy Keeler on 05/20/2020 14:01:14 Timothy Gibson ([id number]) -------------------------------------------------------------------------------- Physician Orders Details Patient Name: Timothy Gibson Date of Service: 05/20/2020 8:00 AM Medical Record Number: [id number] Patient Account Number: [id number] Date of Birth/Sex: 04-06-1951 (69 y.o. M) Treating RN: Carlene Coria Primary Care Provider: Merrie Roof Other Clinician: Referring Provider: Merrie Roof Treating Provider/Extender: Skipper Cliche in Treatment: 22 Verbal / Phone Orders: No Diagnosis Coding ICD-10 Coding Code Description [phone] Other chronic osteomyelitis, left ankle and foot E11.621 Type 2 diabetes mellitus with foot ulcer L97.522 Non-pressure chronic ulcer of other part of left foot with fat layer exposed I10 Essential (primary) hypertension Wound Cleansing Wound #3 Left Toe Great o Dial antibacterial soap, wash wounds, rinse and pat dry prior to dressing wounds o May Shower, gently pat wound dry prior to applying new dressing. Anesthetic (add to Medication List) Wound #3 Left Toe Great o Topical Lidocaine 4% cream applied to wound bed prior to debridement (In Clinic Only). Primary Wound Dressing Wound #3 Left Toe Great o Other: - gentamycin cream Secondary Dressing Wound #3 Left Toe Great o Dry Gauze o Conform/Kerlix Dressing Change Frequency Wound #3 Left Toe Great o Change dressing twice daily. o Other: - As needed Follow-up Appointments Wound #3 Left Toe Great o Return Appointment in 1 week. Off-Loading Wound #3 Left Toe Great o Open toe surgical shoe with peg assist. - left foot  Electronic Signature(s) Signed:  05/20/2020 4:18:53 PM By: Worthy Keeler PA-C Signed: 05/20/2020 5:01:08 PM By: Carlene Coria RN Entered By: Carlene Coria on 05/20/2020 08:31:49 Timothy Gibson, Timothy Gibson ([id number]) -------------------------------------------------------------------------------- Problem List Details Patient Name: Timothy Gibson Date of Service: 05/20/2020 8:00 AM Medical Record Number: [id number] Patient Account Number: [id number] Date of Birth/Sex: 02-Sep-1950 (69 y.o. M) Treating RN: Carlene Coria Primary Care Provider: Merrie Roof Other Clinician: Referring Provider: Merrie Roof Treating Provider/Extender: Skipper Cliche in Treatment: 59 Active Problems ICD-10 Encounter Code Description Active Date MDM Diagnosis [phone] Other chronic osteomyelitis, left ankle and foot 07/29/2019 No Yes E11.621 Type 2 diabetes mellitus with foot ulcer 07/15/2019 No Yes L97.522 Non-pressure chronic ulcer of other part of left foot with fat layer 07/15/2019 No Yes exposed [place] (primary) hypertension 07/15/2019 No Yes Inactive Problems Resolved Problems Electronic Signature(s) Signed: 05/20/2020 4:18:53 PM By: Worthy Keeler PA-C Signed: 05/20/2020 5:01:08 PM By: Carlene Coria RN Previous Signature: 05/20/2020 8:13:04 AM Version By: Worthy Keeler PA-C Entered By: Carlene Coria on 05/20/2020 08:18:21 Timothy Gibson ([id number]) -------------------------------------------------------------------------------- Progress Note Details Patient Name: Timothy Gibson Date of Service: 05/20/2020 8:00 AM Medical Record Number: [id number] Patient Account Number: [id number] Date of Birth/Sex: 10-06-1950 (69 y.o. M) Treating RN: Carlene Coria Primary Care Provider: Merrie Roof Other Clinician: Referring Provider: Merrie Roof Treating Provider/Extender: Skipper Cliche in Treatment: 13 Subjective Chief Complaint Information obtained from Patient Left Great Toe History of Present Illness (HPI) 11/27/17 on  evaluation today patient presents for initial evaluation concerning an issue that he has been having with his right great toe which began last Thursday 11/22/17. He has a history of diabetes mellitus type to which he has had for greater than 30 years currently he is on insulin. Subsequently he also has a history of hypertension. On physical exam inspection is also appears he may have some peripheral vascular disease with his ABI's being noncompressible bilaterally. He is on dialysis as well and this is on Monday, Wednesday, and Friday. Patient was hospitalized back in January/February 2019 although this was more for stomach/back pain though they never told me exactly what was going on. This is according to the patient. Subsequently the ulcer which is between the third and fourth toe when space of the right foot as well is on the plantar aspect of the fourth toe is due to him having cleaned his toes this morning and he states that his finger which is large split the toe tissue in between causing the wounds that we currently see. With that being said he states this has happened before I explained I would definitely recommend that he not do this any longer. He can use a small soft washcloth to clean between the toes without causing trauma. Subsequently the right great toe hatchery does show evidence of necrotic tissue present on the surface of the wound specifically there is callous and dead skin surrounding which is trapping fluid causing problems as far as the wound is concerned. The surface of the wound does show slough although due to his vascular flow I'm not going to sharply debride this today I think I will selectively debride away the necrotic skin as well is callous from around the surrounding so this will not continue to be a moisture issue. Nonetheless the patient has no pain he does have diabetic neuropathy. 12/06/17-He is here in follow-up evaluation for a right great toe ulcer. He is voicing no  complaints or concerns. He continues to infrequently/socially smokes  cigars with no desire to quit. He has been compliant with offloading, using open toed surgical shoe; has been compliant using Santyl daily. He continues on clindamycin although takes it inconsistently secondary to indigestion. The plain film x-ray performed on 5/23 impression: Soft tissue swelling of the left first toe and is noted with probable irregular lucency involving distal tuft of first distal phalanx concerning for osteomyelitis. MRI may be performed for further evaluation; MRI ordered. The vascular evaluation performed on 5/24 field non- compressible ABIs bilaterally and reduced TBI bilaterally suggesting significant tibial disease. He will be referred to vascular medicine for further evaluation. 12/13/17-He is here in follow-up evaluation for right great toe ulcer. He has appointment next Thursday for the MRI and vascular evaluation. Wound culture obtained last week grew abundant Klebsiella oxytoca (multidrug sensitivity), and abundant enteric coccus faecalis (sensitive to ampicillin). Ampicillin and Cipro were called in, along with a probiotic. In light of his appointments next Thursday he will follow-up in 2 weeks, we will continue with Santyl 12/27/17-He is here for evaluation for a right great toe ulcer. 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 He did have evaluation by vascular medicine, Dr. Ronalee Belts, on 6/13, at that appointment it was decided for him to undergo angiography with possible intervention but he refused and is still considering. If he chooses to have it done he will contact the office. He has not heard back from either ID offices that he was referred to two weeks ago: [place] and [place]. there is improvement in both appearance and measurement to the ulcer. We will extend antibiotic therapy (amoxicillin 500 every 12, Cipro 500  daily) for additional 2 weeks pending infectious disease consult, he will continue with Santyl daily. He was reminded to continue with probiotic therapy while on oral antibiotics; he denies any GI disturbance. 01/03/18-He is here in follow-up evaluation for right great toe ulcer. He is decided to not undergo any vascular intervention at this time. He was established with [facility] infectious disease (Dr. Linus Salmons) last week initiated on vancomycin and cefazolin with dialysis treatment for 6 weeks. We will continue with current treatment plan with Santyl and offloading and he will follow-up in 2 weeks 01/17/18-He is seen in follow-up evaluation for right great toe ulcer. There is improvement in appearance with less nonviable tissue present. We will continue with same treatment plan he will follow-up next week. He is tolerating IV antibiotics without any complications 9/98/33-AS is seen in follow up evaluation for right great toe ulcer. There continues to be improvement. He is tolerating IV antibiotics with hemodialysis, completion date of 7/31. We will transition to collagen and and follow-up next week 02/07/18-He is here in follow up for a right great toe ulcer. There is red granulation tissue throughout the wound, improved in appearance. He completed antibiotics yesterday. He saw podiatry last Thursday for nail trimming, at that appointment he periwound callus was trimmed and a culture was obtained; culture negative. He does not have a follow up appointment with infectious disease. We will submit for grafix. 02/14/18-He is here in follow-up evaluation for a right great toe ulcer. There is slow improvement, red granulation tissue throughout. The insurance approval for grafix is pending. We will continue with collagen and he'll follow-up next week 02/21/18-He is seen in follow-up evaluation for right great toe ulcer. There is improvement with red granulation tissue throughout. He was approved for oasis and  this was placed today. He  will follow up next week 02/28/18-He is seen in follow-up evaluation for right great toe ulcer. The wound is stable, oasis applied and he'll follow-up next week 03/07/18-He is seen in follow-up violation of her right great toe ulcer. The wound is dry, no evidence of drainage. The wound appears healed today. He was painted with Betadine, instructed to paint with Betadine daily, cover with band-aid for the next week and then cover with band-aid for the following week continue with surgical shoe. He was encouraged to contact the clinic with any evidence of drainage, or change in appearance to toe/wound. He will be discharged from wound care services Readmission: 07/14/18 on evaluation today patient presents for initial evaluation our clinic concerning issues that he is having with his left great toe. We previously saw him in 2019 for his right great toe which was actually worse at that time and subsequently was able to be completely healed in the end although it did take several months. With that being said the patient is currently having an area on his left great toe that occurred initially as result of a blister that came up he's not really sure why or how. Subsequently this was initially treated by his podiatrist though the recommendation there was apparently amputation according to the patient and his daughter who was present with him today. With that being said the patient did not want to proceed with amputation and would like to try to perform wound care and get this to heal without having to go down that road. We were able to do that with his other toe and so he would like to at least give that a try before going forward with amputation. SHAFT, CORIGLIANO ([id number]) Again I completely understand his concern in this regard. With that being said I did discuss with the patient obviously that it's always a chance that we cannot get this to heal with normal wound care measures  but obviously we will give it our best shot. He does actually have an appointment scheduled for later today with the vascular specialist in order to evaluate his blood flow. That was at the recommendation of his podiatrist as well. Obviously if he is having good arterial flow and nonetheless is still experiencing issues with having the wound delay in healing then it may simply be a matter of we need to initiate more aggressive wound to therapy in order to see if we can get this to heal. No fevers, chills, nausea, or vomiting noted at this time. The patient notes that this woman has been present for approximately three weeks. He is not a smoker. His most recent hemoglobin A1c was 6.2 on 06/18/19. 07/17/2019 on evaluation today patient appears unfortunately to not be doing as well. He did see vascular I did review the note as well. That was on 07/15/2019. Subsequently there can actually be taking him to the OR for an angiogram due to poor arterial flow into this extremity. Apparently the atherosclerotic changes were severe. The patient is stated to be at risk for limb loss. Nonetheless the good news is this is being scheduled fairly quickly he will have the surgery/procedure next Thursday. With that being said I am going also go ahead based on the results of his x-ray which showed some chance of osteomyelitis in the distal portion of the toe get the patient set up for an MRI to further evaluate for the possibility of osteomyelitis obviously if he does have osteomyelitis we need to know so that  we can address this appropriately. 07/29/2019 upon evaluation today patient appears to be doing better in general with regard to his toe ulcer. He does have much better blood flow you can actually feel a palpable pulse which appears to be very strong at this time. I did review his arterial intervention/angiogram which did show that he had evidence of sufficient arterial flow restoration at this point. He did have  occlusions that were 60 and 80% that residually were only 10 and 25% with no limiting flow. This is excellent news and hopefully he will continue to show signs of improvement in light of the improved vascular status. With that being said the toe mainly shows somewhat of an eschar today I think that this is fairly stable and as such probably would be best not to really do much in the way of debridement at this point I do believe Betadine would likely be a good option. 08/07/2019 on evaluation today patient actually appears to be doing about the same. The one difference is the eschar that we were just seeing if we can maintain is actually starting to loosen up and leak around the edges I think it is time to actually go ahead and remove this today. Is also no longer stable is very soft. Nonetheless the patient is okay with proceeding with debridement at this point. Fortunately there is no signs of active infection. No fevers, chills, nausea, vomiting, or diarrhea. The patient states that he took his last antibiotic that he had last night. He would need a refill as he will not be seeing infectious disease until February 2. Nonetheless obviously I do not want him to have any break in therapy especially when he is doing as well as he is currently. 08/14/2019 on evaluation today patient appears to be doing quite well all things considered in regard to his toe. I do feel like he is making progress and though this is not completely healed by any means we still have some ways to go I feel like he is making progress which is excellent. There is no sign of active infection at this time systemically. He does have osteomyelitis of the distal phalanx of his great toe on the left. He did see Dr. Linus Salmons. That is the infectious disease doctor in [place] where he was referred. Dr. Linus Salmons apparently according to the note did not feel like the patient's MRI revealed osteomyelitis but just reactive marrow and subsequently did  not recommend any antibiotic therapy whatsoever. To be peripherally honest I am not really in agreement with this based on the MRI results and what we are seeing and I think he has been responding at least decently well to oral medication as well. Nonetheless we may want to check into the possibility of IV antibiotics being administered at the dialysis center. I will discuss this with Dr. Dellia Nims further. 08/21/2019 upon evaluation today patient appears at this point to be making some progress with regard to his wounds. He has been tolerating the dressing changes without complication. Fortunately there is no evidence of active infection at this time. Overall I feel like he is doing well with the oral antibiotics I discussed with Dr. Dellia Nims the possibility of doing IV antibiotic therapy versus continuing with the oral. His opinion was if the patient is doing well with oral to maybe stick with that. Obviously we can initiate additional Cipro as well just to help ensure that there is nothing worsening here. The Cipro should be beneficial for him  as well. That helps cover more gram-negative's were is at the doxycycline is more gram- positive's. 09/02/2019 on evaluation today patient appears to be doing fairly well upon inspection today. With that being said though the patient is continuing to do well he does also continue to develop issues with buildup of necrotic tissue on the distal portion of his toe he does have good arterial flow however. For that reason I am going to suggest that what we may need to do is consider using something such as Santyl to try to keep this free and clear. Also discussed in greater detail HBO therapy with him today please see plan for additional recommendations and results of this discussion. 09/11/2019 upon evaluation today patient appears to be doing well with regard to his toe ulcer. This actually appears to be much better today using the Santyl than it was previous.  Fortunately there is no signs of active infection. No fevers, chills, nausea, vomiting, or diarrhea. 09/18/2019 upon evaluation today patient appears to be doing much better in regard to his wound in general. The Santyl was doing a good job at loosening stuff up here for [place] which is excellent news. With that being said he is still taking the oral antibiotic. I sent this in last for him on 09/03/2019. This was the doxycycline. When this runs out he will actually be complete as far as the treatment is concerned. With that being said I am getting continue the Cipro for 1 additional month. Overall his wound seems to be doing quite well. 09/25/2019 upon evaluation today patient appears to be doing well with regard to his toe ulcer. This is measuring much better and overall seems to be making good progress. Fortunately there is no signs of active infection at this time. No fevers, chills, nausea, vomiting, or diarrhea. 10/02/2019 upon evaluation today patient appears to be doing well with regard to his wound. He has been tolerating the dressing changes without complication. Fortunately there is no signs of active infection at this time. No fevers, chills, nausea, vomiting, or diarrhea. Overall I am extremely pleased with how things seem to be progressing. I think the wound bed is at the point now where we can likely proceed with additional measures to try to get this healed more quickly I think Dermagraft may be a good possibility for the patient. We can definitely look into seeing about approval for this. 10/09/2019 upon evaluation today patient's wound actually appears to be doing well although it still is progressing somewhat slowly compared to what I would like to see. With that being said I did order Apligraf for the patient which I felt would keep the wound nice and moist without allowing it to dry out and hopefully allow this to heal much more effectively and quickly. The patient actually did obtain  approval and therefore we can apply that today. 10/23/2019 upon evaluation today patient's wound actually appears to be doing significantly better with regard to his toe ulcer. The tissue at the base of the wound is greatly improved and overall I am very pleased with the progress made. We did use Apligraf 2 weeks ago on the toe that did excellent for him much it has completely dissolved after as of the 2 weeks. They only had to change the dressing a few times due to drainage through. Obviously they left the Steri-Strips and Mepitel in place. Unfortunately we do not have the Apligraf available today for application regard therefore have to use a different product  here in the clinic today and then order the Apligraf for next Thursday. That will be his second application. 4/22; patient arrived with overhanging skin and subcutaneous tissue on the wound margins this was removed. Apligraf #2 applied TORSTEN, WENIGER ([id number]) 11/13/2019 upon evaluation today patient actually appears to be doing well with regard to his foot ulcer. He has been tolerating the dressing changes without complication. The overall wound bed appears to be healthier and has filled in as well as not nearly as deep as it was the tissue is also better quality. Overall I am extremely pleased with how things seem to be progressing. The patient is likewise very happy in this regard. 5/20; wound is measuring smaller had Apligraf #3 last time. Apligraf #4 today 12/12/2019 upon evaluation today patient actually appears to be doing quite well with regard to his wound in my opinion. The ulcer is significantly smaller compared to last time I saw him although that has been several weeks. I do believe the Apligraf has been beneficial he has 1 remaining today this will be application #5. 5/36/1443 upon evaluation today patient appears to be doing well with regard to his toe ulcer. He is measuring about the same today but he has come a long way  from where this started. Fortunately there is no evidence of active infection which is great news and overall I am very pleased with the progress that is been made. Nonetheless at this point we have completed the Apligraf course he did see improvement through this but I am going to switch to something different as of today. 01/08/2020 upon evaluation today patient appears to be doing much better in regards to his toe ulcer this is measuring smaller and overall very pleased with the progress. The Hydrofera Blue seems to be doing a great job. Fortunately there is no evidence of active infection at this time. No fevers, chills, nausea, vomiting, or diarrhea. 01/29/2020 upon evaluation today patient appears to be doing okay in regard to his toe ulcer. The main issue I see is that he actually has trouble right now with callus growing and covering over the wound bed. This prevents him from being able to get a dressing in contact with the wound bed and subsequently fluid collection underneath. I think that we probably need to see him more frequently to keep this pared down and debrided to the point to allow the dressings to effectively heal this wound. I discussed that with the patient today. 02/05/2020 on evaluation today patient appears to be doing well with regard to his wounds currently. In fact he just has 1 on his toe remaining at this point he seems to be doing quite well in my opinion. I am very pleased with the overall appearance today. There is no signs of active infection I do think keeping the callus trimmed down has been of great benefit as far as healing is concerned. I do not think we need to go 3 weeks out like we were in the past I believe that is been somewhat more detrimental than good for him. 02/19/2020 upon evaluation today patient appears to be doing better in regard to his toe ulcer still there is callus buildup around the top of the toe which unfortunately is I think causing some issues  here with trapping a little fluid underneath them after cleaning this away. Fortunately there is no evidence of active infection at this time. Overall I feel like the patient is doing quite well which is  great news. 03/04/2020 on evaluation today patient appears to be doing well with regard to his toe ulcer though he still has a lot of drying out of the endoform and a lot of callus buildup as well unfortunately. With that being said I think that he is can require some sharp debridement to clear away some of the callus today. We will see what the wound looks like following. 03/11/2020 on evaluation today patient appears to be doing excellent in regard to his toe ulcer. He did have a lot of callus and even some of the nail, growing over top of the area where the wound is trying to heal. For that reason I did actually trim back some of the nail also did go ahead and trim away some of the callus as well. He tolerated all this without complication the wound appears to be doing well and is showing no signs of infection at this point. Overall I am very pleased. 03/18/2020 upon evaluation today patient appears to be doing well with regard to his toe ulcer. There does not appear to be any significant buildup of callus at this point and overall I do believe that the alginate has been beneficial. Trimming back the toenail also seems to have helped a lot in this regard I think that was causing some rubbing on the end of the toe. Fortunately overall he is doing much better unfortunately he notes that his grandson has been placed on hospice he is 16 and apparently not doing too well. He wants to make a trip to go see him I really think that something he probably should do and not wait for this to try to heal. 04/01/2020 upon evaluation today patient's toe ulcer appears to be doing about the same. He does have some callus buildup slowly he is making progress but were not completely closed as of yet. I am going to need to  perform some debridement today to clear this way. 04/15/2020 on evaluation today patient appears to be doing well in general in regard to his wounds there is no signs of active infection in regard to the toe obviously although he does have a lot of drainage which does raise a bit of a question in that regard. I did discuss with him possibilities for we may want to do treatment wise to try to help out with this. With that being said I Georgina Peer probably start with a topical antibiotic and continuing with the current dressing for the time being. 04/29/2020 on evaluation today patient appears to be doing decently well in regard to his toe ulcer. There is no signs of active infection at this time. Fortunately I believe that the patient has made some progress since last time I saw him. Still this is taken much longer than I would like to see obviously the patient is ready for this to be done. 05/13/2020 upon evaluation today patient appears to be showing some signs of improvement with regard to his toe ulcer this is great news and overall very pleased in that regard. There is no evidence of active infection currently which is great news as well. No fevers, chills, nausea, vomiting, or diarrhea. 05/20/2020 on evaluation today patient appears to be doing actually very well in regard to his toe ulcer. He did have some dry skin buildup on the surface of the wound along with some drainage and dressing material. I remove this and the wound underneath appears to be doing significantly better. There is a lot of  new skin growth and I am very pleased. With that being said I think that we may want to just use a little bit of antibiotic cream over this for the next week and see if we can get this to close. Objective ARRICK, DUTTON ([id number]) Constitutional Well-nourished and well-hydrated in no acute distress. Vitals Time Taken: 8:06 AM, Height: 76 in, Weight: 244 lbs, BMI: 29.7, Temperature: 97.9 F, Pulse: 88  bpm, Respiratory Rate: 18 breaths/min, Blood Pressure: 193/101 mmHg. General Notes: Provider made aware of elevated BP Respiratory normal breathing without difficulty. Psychiatric this patient is able to make decisions and demonstrates good insight into disease process. Alert and Oriented x 3. pleasant and cooperative. General Notes: Patient's wound did require some sharp debridement to remove minimal slough, skin, and dressing material down to good subcutaneous tissue which he tolerated without any pain there was minimal bleeding. Post debridement this actually looks to be the best that have seen in a very long time. I think is very close to complete closure. Integumentary (Hair, Skin) Wound #3 status is Open. Original cause of wound was Gradually Appeared. The wound is located on the Left Toe Great. The wound measures 0.1cm length x 0.1cm width x 0.1cm depth; 0.008cm^2 area and 0.001cm^3 volume. There is Fat Layer (Subcutaneous Tissue) exposed. There is no tunneling or undermining noted. There is a none present amount of drainage noted. The wound margin is thickened. There is no granulation within the wound bed. There is a large (67-100%) amount of necrotic tissue within the wound bed including Eschar. 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 There was a Excisional Skin/Subcutaneous Tissue Debridement with a total area of 0.01 sq cm performed by Tommie Sams., PA-C. With the following instrument(s): Curette to remove Viable and Non-Viable tissue/material. Material removed includes Subcutaneous Tissue,  Slough, Skin: Dermis, and Skin: Epidermis after achieving pain control using Lidocaine 5% topical ointment. No specimens were taken. A time out was conducted at 08:19, prior to the start of the procedure. A Minimum amount of bleeding was controlled with Pressure. The procedure was tolerated well with a pain level of 0 throughout and a pain level of 0 following the procedure. Post Debridement Measurements: 0.2cm length x 0.3cm width x 0.1cm depth; 0.005cm^3 volume. Character of Wound/Ulcer Post Debridement is improved. Severity of Tissue Post Debridement is: Fat layer exposed. Post procedure Diagnosis Wound #3: Same as Pre-Procedure Plan Wound Cleansing: Wound #3 Left Toe Great: Dial antibacterial soap, wash wounds, rinse and pat dry prior to dressing wounds May Shower, gently pat wound dry prior to applying new dressing. Anesthetic (add to Medication List): Wound #3 Left Toe Great: Topical Lidocaine 4% cream applied to wound bed prior to debridement (In Clinic Only). Primary Wound Dressing: Wound #3 Left Toe Great: Other: - gentamycin cream Secondary Dressing: SOTERO, BRINKMEYER ([id number]) Wound #3 Left Toe Great: Dry Gauze Conform/Kerlix Dressing Change Frequency: Wound #3 Left Toe Great: Change dressing twice daily. Other: - As needed Follow-up Appointments: Wound #3 Left Toe Great: Return Appointment in 1 week. Off-Loading: Wound #3 Left Toe Great: Open toe surgical shoe with peg assist. - left foot 1. I would recommend  currently that we go ahead and continue with treatment at this point however we will get a just use gentamicin cream a small amount applied to the wound bed followed by a Band-Aid to be changed 2 times a day hopefully this should keep the area clean and dry. 2. He will clean with Dial antibacterial soap when he performs the dressing changes. 3 I would also recommend that the patient continue to monitor for any signs of worsening or infection obviously I do not  see anything like that right now and in fact this appears to be healing quite nicely. We will see patient back for reevaluation in 1 week here in the clinic. If anything worsens or changes patient will contact our office for additional recommendations. Electronic Signature(s) Signed: 05/20/2020 2:57:03 PM By: Worthy Keeler PA-C Entered By: Worthy Keeler on 05/20/2020 14:57:03 Timothy Gibson ([id number]) -------------------------------------------------------------------------------- SuperBill Details Patient Name: Timothy Gibson Date of Service: 05/20/2020 Medical Record Number: [id number] Patient Account Number: [id number] Date of Birth/Sex: 1951-04-14 (69 y.o. M) Treating RN: Carlene Coria Primary Care Provider: Merrie Roof Other Clinician: Referring Provider: Merrie Roof Treating Provider/Extender: Skipper Cliche in Treatment: 44 Diagnosis Coding ICD-10 Codes Code Description [phone] Other chronic osteomyelitis, left ankle and foot E11.621 Type 2 diabetes mellitus with foot ulcer L97.522 Non-pressure chronic ulcer of other part of left foot with fat layer exposed I10 Essential (primary) hypertension Facility Procedures CPT4 Code: [id number] Description: 74128 - DEB SUBQ TISSUE 20 SQ CM/< Modifier: Quantity: 1 CPT4 Code: Description: ICD-10 Diagnosis Description L97.522 Non-pressure chronic ulcer of other part of left foot with fat layer exp Modifier: osed Quantity: Physician Procedures CPT4 Code: 7867672 Description: 11042 - WC PHYS SUBQ TISS 20 SQ CM Modifier: Quantity: 1 CPT4 Code: Description: ICD-10 Diagnosis Description L97.522 Non-pressure chronic ulcer of other part of left foot with fat layer exp Modifier: osed Quantity: Electronic Signature(s) Signed: 05/20/2020 2:57:40 PM By: Worthy Keeler PA-C Entered By: Worthy Keeler on 05/20/2020 14:57:39

## 2020-05-21 ENCOUNTER — Encounter: Payer: Self-pay | Admitting: Family Medicine

## 2020-05-21 ENCOUNTER — Ambulatory Visit (INDEPENDENT_AMBULATORY_CARE_PROVIDER_SITE_OTHER): Payer: Medicare Other | Admitting: Family Medicine

## 2020-05-21 VITALS — BP 143/66 | HR 90 | Temp 98.2°F | Ht 76.0 in | Wt 256.0 lb

## 2020-05-21 DIAGNOSIS — I70213 Atherosclerosis of native arteries of extremities with intermittent claudication, bilateral legs: Secondary | ICD-10-CM

## 2020-05-21 DIAGNOSIS — D631 Anemia in chronic kidney disease: Secondary | ICD-10-CM | POA: Diagnosis not present

## 2020-05-21 DIAGNOSIS — J189 Pneumonia, unspecified organism: Secondary | ICD-10-CM | POA: Diagnosis not present

## 2020-05-21 DIAGNOSIS — D509 Iron deficiency anemia, unspecified: Secondary | ICD-10-CM | POA: Diagnosis not present

## 2020-05-21 DIAGNOSIS — N2581 Secondary hyperparathyroidism of renal origin: Secondary | ICD-10-CM | POA: Diagnosis not present

## 2020-05-21 DIAGNOSIS — Z23 Encounter for immunization: Secondary | ICD-10-CM | POA: Diagnosis not present

## 2020-05-21 DIAGNOSIS — Z992 Dependence on renal dialysis: Secondary | ICD-10-CM | POA: Diagnosis not present

## 2020-05-21 DIAGNOSIS — N186 End stage renal disease: Secondary | ICD-10-CM | POA: Diagnosis not present

## 2020-05-21 NOTE — Progress Notes (Signed)
BP (!) 143/66   Pulse 90   Temp 98.2 F (36.8 C) (Oral)   Ht 6\' 4"  (1.93 m)   Wt 256 lb (116.1 kg)   SpO2 95%   BMI 31.16 kg/m    Subjective:    Patient ID: Timothy Gibson, male    DOB: April 03, 1951, 69 y.o.   MRN: [id number]  HPI: Timothy Gibson is a 69 y.o. male  Chief Complaint  Patient presents with  . Pneumonia    Patient states he is feeling a lot better today   Feeling so so much better. Since starting the levaquin has not been coughing. No SOB. No wheezing. No fevers. Feeling back to normal. No other concerns.   Relevant past medical, surgical, family and social history reviewed and updated as indicated. Interim medical history since our last visit reviewed. Allergies and medications reviewed and updated.  Review of Systems  Constitutional: Negative.   HENT: Negative.   Respiratory: Negative.   Cardiovascular: Negative.   Gastrointestinal: Negative.   Psychiatric/Behavioral: Negative.     Per HPI unless specifically indicated above     Objective:    BP (!) 143/66   Pulse 90   Temp 98.2 F (36.8 C) (Oral)   Ht 6\' 4"  (1.93 m)   Wt 256 lb (116.1 kg)   SpO2 95%   BMI 31.16 kg/m   Wt Readings from Last 3 Encounters:  05/21/20 256 lb (116.1 kg)  05/18/20 256 lb (116.1 kg)  05/10/20 257 lb 12.8 oz (116.9 kg)    Physical Exam Vitals and nursing note reviewed.  Constitutional:      General: He is not in acute distress.    Appearance: Normal appearance. He is obese. He is not ill-appearing, toxic-appearing or diaphoretic.  HENT:     Head: Normocephalic and atraumatic.     Right Ear: External ear normal.     Left Ear: External ear normal.     Nose: Nose normal.     Mouth/Throat:     Mouth: Mucous membranes are moist.     Pharynx: Oropharynx is clear.  Eyes:     General: No scleral icterus.       Right eye: No discharge.        Left eye: No discharge.     Extraocular Movements: Extraocular movements intact.     Conjunctiva/sclera: Conjunctivae  normal.     Pupils: Pupils are equal, round, and reactive to light.  Cardiovascular:     Rate and Rhythm: Normal rate and regular rhythm.     Pulses: Normal pulses.     Heart sounds: Normal heart sounds. No murmur heard.  No friction rub. No gallop.   Pulmonary:     Effort: Pulmonary effort is normal. No respiratory distress.     Breath sounds: Normal breath sounds. No stridor. No wheezing, rhonchi or rales.  Chest:     Chest wall: No tenderness.  Musculoskeletal:        General: Normal range of motion.     Cervical back: Normal range of motion and neck supple.  Skin:    General: Skin is warm and dry.     Capillary Refill: Capillary refill takes less than 2 seconds.     Coloration: Skin is not jaundiced or pale.     Findings: No bruising, erythema, lesion or rash.  Neurological:     General: No focal deficit present.     Mental Status: He is alert and oriented to person, place, and  time. Mental status is at baseline.  Psychiatric:        Mood and Affect: Mood normal.        Behavior: Behavior normal.        Thought Content: Thought content normal.        Judgment: Judgment normal.     Results for orders placed or performed in visit on 05/10/20  CBC with Differential/Platelet  Result Value Ref Range   WBC 3.7 3.4 - 10.8 x10E3/uL   RBC 3.13 (L) 4.14 - 5.80 x10E6/uL   Hemoglobin 9.9 (L) 13.0 - 17.7 g/dL   Hematocrit 30.3 (L) 37.5 - 51.0 %   MCV 97 79 - 97 fL   MCH 31.6 26.6 - 33.0 pg   MCHC 32.7 31 - 35 g/dL   RDW 12.7 11.6 - 15.4 %   Platelets 127 (L) 150 - 450 x10E3/uL   Neutrophils 65 Not Estab. %   Lymphs 22 Not Estab. %   Monocytes 6 Not Estab. %   Eos 6 Not Estab. %   Basos 1 Not Estab. %   Neutrophils Absolute 2.4 1.40 - 7.00 x10E3/uL   Lymphocytes Absolute 0.8 0 - 3 x10E3/uL   Monocytes Absolute 0.2 0 - 0 x10E3/uL   EOS (ABSOLUTE) 0.2 0.0 - 0.4 x10E3/uL   Basophils Absolute 0.0 0 - 0 x10E3/uL   Immature Granulocytes 0 Not Estab. %   Immature Grans (Abs) 0.0  0.0 - 0.1 x10E3/uL  Comprehensive metabolic panel  Result Value Ref Range   Glucose 182 (H) 65 - 99 mg/dL   BUN 23 8 - 27 mg/dL   Creatinine, Ser 7.63 (H) 0.76 - 1.27 mg/dL   GFR calc non Af Amer 7 (L) >59 mL/min/1.73   GFR calc Af Amer 8 (L) >59 mL/min/1.73   BUN/Creatinine Ratio 3 (L) 10 - 24   Sodium 142 134 - 144 mmol/L   Potassium 4.0 3.5 - 5.2 mmol/L   Chloride 97 96 - 106 mmol/L   CO2 31 (H) 20 - 29 mmol/L   Calcium 8.7 8.6 - 10.2 mg/dL   Total Protein 6.6 6.0 - 8.5 g/dL   Albumin 3.7 (L) 3.8 - 4.8 g/dL   Globulin, Total 2.9 1.5 - 4.5 g/dL   Albumin/Globulin Ratio 1.3 1.2 - 2.2   Bilirubin Total 0.3 0.0 - 1.2 mg/dL   Alkaline Phosphatase 94 44 - 121 IU/L   AST 12 0 - 40 IU/L   ALT 8 0 - 44 IU/L  Lipid Panel w/o Chol/HDL Ratio  Result Value Ref Range   Cholesterol, Total 99 (L) 100 - 199 mg/dL   Triglycerides 74 0 - 149 mg/dL   HDL 36 (L) >39 mg/dL   VLDL Cholesterol Cal 16 5 - 40 mg/dL   LDL Chol Calc (NIH) 47 0 - 99 mg/dL  TSH  Result Value Ref Range   TSH 0.686 0.450 - 4.500 uIU/mL  Bayer DCA Hb A1c Waived  Result Value Ref Range   HB A1C (BAYER DCA - WAIVED) 5.6 <7.0 %      Assessment & Plan:   Problem List Items Addressed This Visit    None    Visit Diagnoses    Pneumonia of left lower lobe due to infectious organism    -  Primary   Resolved. Lungs clear. Finish levaquin. OK to cancel CT as he's better. Continue to monitor.        Follow up plan: Return January follow up DM.

## 2020-05-24 DIAGNOSIS — D631 Anemia in chronic kidney disease: Secondary | ICD-10-CM | POA: Diagnosis not present

## 2020-05-24 DIAGNOSIS — Z992 Dependence on renal dialysis: Secondary | ICD-10-CM | POA: Diagnosis not present

## 2020-05-24 DIAGNOSIS — Z23 Encounter for immunization: Secondary | ICD-10-CM | POA: Diagnosis not present

## 2020-05-24 DIAGNOSIS — D509 Iron deficiency anemia, unspecified: Secondary | ICD-10-CM | POA: Diagnosis not present

## 2020-05-24 DIAGNOSIS — N186 End stage renal disease: Secondary | ICD-10-CM | POA: Diagnosis not present

## 2020-05-24 DIAGNOSIS — N2581 Secondary hyperparathyroidism of renal origin: Secondary | ICD-10-CM | POA: Diagnosis not present

## 2020-05-26 DIAGNOSIS — N2581 Secondary hyperparathyroidism of renal origin: Secondary | ICD-10-CM | POA: Diagnosis not present

## 2020-05-26 DIAGNOSIS — D509 Iron deficiency anemia, unspecified: Secondary | ICD-10-CM | POA: Diagnosis not present

## 2020-05-26 DIAGNOSIS — Z23 Encounter for immunization: Secondary | ICD-10-CM | POA: Diagnosis not present

## 2020-05-26 DIAGNOSIS — D631 Anemia in chronic kidney disease: Secondary | ICD-10-CM | POA: Diagnosis not present

## 2020-05-26 DIAGNOSIS — N186 End stage renal disease: Secondary | ICD-10-CM | POA: Diagnosis not present

## 2020-05-26 DIAGNOSIS — Z992 Dependence on renal dialysis: Secondary | ICD-10-CM | POA: Diagnosis not present

## 2020-05-27 ENCOUNTER — Ambulatory Visit: Payer: Medicare Other | Admitting: Physician Assistant

## 2020-05-28 ENCOUNTER — Other Ambulatory Visit: Payer: Self-pay

## 2020-05-28 ENCOUNTER — Encounter: Payer: Medicare Other | Admitting: Physician Assistant

## 2020-05-28 DIAGNOSIS — M86672 Other chronic osteomyelitis, left ankle and foot: Secondary | ICD-10-CM | POA: Diagnosis not present

## 2020-05-28 DIAGNOSIS — Z992 Dependence on renal dialysis: Secondary | ICD-10-CM | POA: Diagnosis not present

## 2020-05-28 DIAGNOSIS — D631 Anemia in chronic kidney disease: Secondary | ICD-10-CM | POA: Diagnosis not present

## 2020-05-28 DIAGNOSIS — L97522 Non-pressure chronic ulcer of other part of left foot with fat layer exposed: Secondary | ICD-10-CM | POA: Diagnosis not present

## 2020-05-28 DIAGNOSIS — N186 End stage renal disease: Secondary | ICD-10-CM | POA: Diagnosis not present

## 2020-05-28 DIAGNOSIS — D509 Iron deficiency anemia, unspecified: Secondary | ICD-10-CM | POA: Diagnosis not present

## 2020-05-28 DIAGNOSIS — Z23 Encounter for immunization: Secondary | ICD-10-CM | POA: Diagnosis not present

## 2020-05-28 DIAGNOSIS — N2581 Secondary hyperparathyroidism of renal origin: Secondary | ICD-10-CM | POA: Diagnosis not present

## 2020-05-28 DIAGNOSIS — E11621 Type 2 diabetes mellitus with foot ulcer: Secondary | ICD-10-CM | POA: Diagnosis not present

## 2020-05-28 DIAGNOSIS — Z794 Long term (current) use of insulin: Secondary | ICD-10-CM | POA: Diagnosis not present

## 2020-05-28 NOTE — Progress Notes (Addendum)
SHYNE, LEHRKE ([id number]) Visit Report for 05/28/2020 Arrival Information Details Patient Name: Timothy Gibson, Timothy Gibson Date of Service: 05/28/2020 3:30 PM Medical Record Number: [id number] Patient Account Number: [id number] Date of Birth/Sex: 04-06-1951 (69 y.o. M) Treating RN: Dolan Amen Primary Care Burrell Hodapp: Merrie Roof Other Clinician: Referring Kein Carlberg: Merrie Roof Treating Samer Dutton/Extender: Skipper Cliche in Treatment: 51 Visit Information History Since Last Visit Pain Present Now: No Patient Arrived: Cane Arrival Time: 15:06 Accompanied By: friend Transfer Assistance: None Patient Identification Verified: Yes Secondary Verification Process Completed: Yes Patient Requires Transmission-Based Precautions: No Patient Has Alerts: No Electronic Signature(s) Signed: 05/28/2020 4:49:21 PM By: Georges Mouse, Minus Breeding Entered By: Georges Mouse, Minus Breeding on 05/28/2020 15:07:41 Timothy Gibson ([id number]) -------------------------------------------------------------------------------- Clinic Level of Care Assessment Details Patient Name: Timothy Gibson Date of Service: 05/28/2020 3:30 PM Medical Record Number: [id number] Patient Account Number: [id number] Date of Birth/Sex: Aug 01, 1950 (69 y.o. M) Treating RN: Cornell Barman Primary Care Floye Fesler: Merrie Roof Other Clinician: Referring Seraphim Trow: Merrie Roof Treating Jaeceon Michelin/Extender: Skipper Cliche in Treatment: 11 Clinic Level of Care Assessment Items TOOL 4 Quantity Score []  - Use when only an EandM is performed on FOLLOW-UP visit 0 ASSESSMENTS - Nursing Assessment / Reassessment X - Reassessment of Co-morbidities (includes updates in patient status) 1 10 X- 1 5 Reassessment of Adherence to Treatment Plan ASSESSMENTS - Wound and Skin Assessment / Reassessment X - Simple Wound Assessment / Reassessment - one wound 1 5 []  - 0 Complex Wound Assessment / Reassessment - multiple wounds []  - 0 Dermatologic / Skin  Assessment (not related to wound area) ASSESSMENTS - Focused Assessment []  - Circumferential Edema Measurements - multi extremities 0 []  - 0 Nutritional Assessment / Counseling / Intervention []  - 0 Lower Extremity Assessment (monofilament, tuning fork, pulses) []  - 0 Peripheral Arterial Disease Assessment (using hand held doppler) ASSESSMENTS - Ostomy and/or Continence Assessment and Care []  - Incontinence Assessment and Management 0 []  - 0 Ostomy Care Assessment and Management (repouching, etc.) PROCESS - Coordination of Care X - Simple Patient / Family Education for ongoing care 1 15 []  - 0 Complex (extensive) Patient / Family Education for ongoing care []  - 0 Staff obtains Programmer, systems, Records, Test Results / Process Orders []  - 0 Staff telephones HHA, Nursing Homes / Clarify orders / etc []  - 0 Routine Transfer to another Facility (non-emergent condition) []  - 0 Routine Hospital Admission (non-emergent condition) []  - 0 New Admissions / Biomedical engineer / Ordering NPWT, Apligraf, etc. []  - 0 Emergency Hospital Admission (emergent condition) X- 1 10 Simple Discharge Coordination []  - 0 Complex (extensive) Discharge Coordination PROCESS - Special Needs []  - Pediatric / Minor Patient Management 0 []  - 0 Isolation Patient Management []  - 0 Hearing / Language / Visual special needs []  - 0 Assessment of Community assistance (transportation, D/C planning, etc.) []  - 0 Additional assistance / Altered mentation []  - 0 Support Surface(s) Assessment (bed, cushion, seat, etc.) INTERVENTIONS - Wound Cleansing / Measurement BURNHAM, TROST. ([id number]) X- 1 5 Simple Wound Cleansing - one wound []  - 0 Complex Wound Cleansing - multiple wounds X- 1 5 Wound Imaging (photographs - any number of wounds) []  - 0 Wound Tracing (instead of photographs) X- 1 5 Simple Wound Measurement - one wound []  - 0 Complex Wound Measurement - multiple wounds INTERVENTIONS - Wound  Dressings []  - Small Wound Dressing one or multiple wounds 0 X- 1 15 Medium Wound Dressing one or multiple wounds []  - 0 Large Wound Dressing one  or multiple wounds []  - 0 Application of Medications - topical []  - 0 Application of Medications - injection INTERVENTIONS - Miscellaneous []  - External ear exam 0 []  - 0 Specimen Collection (cultures, biopsies, blood, body fluids, etc.) []  - 0 Specimen(s) / Culture(s) sent or taken to Lab for analysis []  - 0 Patient Transfer (multiple staff / Civil Service fast streamer / Similar devices) []  - 0 Simple Staple / Suture removal (25 or less) []  - 0 Complex Staple / Suture removal (26 or more) []  - 0 Hypo / Hyperglycemic Management (close monitor of Blood Glucose) []  - 0 Ankle / Brachial Index (ABI) - do not check if billed separately X- 1 5 Vital Signs Has the patient been seen at the hospital within the last three years: Yes Total Score: 80 Level Of Care: New/Established - Level 3 Electronic Signature(s) Signed: 05/31/2020 10:30:31 AM By: Gretta Cool, BSN, RN, CWS, Kim RN, BSN Entered By: Gretta Cool, BSN, RN, CWS, Kim on 05/28/2020 15:31:37 JAMAURI, KRUZEL ([id number]) -------------------------------------------------------------------------------- Encounter Discharge Information Details Patient Name: Timothy Gibson Date of Service: 05/28/2020 3:30 PM Medical Record Number: [id number] Patient Account Number: [id number] Date of Birth/Sex: [birth year] (69 y.o. M) Treating RN: Cornell Barman Primary Care Cebert Dettmann: Merrie Roof Other Clinician: Referring Kailan Carmen: Merrie Roof Treating Ellianna Ruest/Extender: Skipper Cliche in Treatment: 45 Encounter Discharge Information Items Discharge Condition: Stable Ambulatory Status: Cane Discharge Destination: Home Transportation: Private Auto Accompanied By: friend Schedule Follow-up Appointment: Yes Clinical Summary of Care: Electronic Signature(s) Signed: 05/31/2020 10:30:31 AM By: Gretta Cool, BSN, RN, CWS, Kim RN,  BSN Entered By: Gretta Cool, BSN, RN, CWS, Kim on 05/28/2020 15:40:04 Timothy Gibson ([id number]) -------------------------------------------------------------------------------- Lower Extremity Assessment Details Patient Name: Timothy Gibson Date of Service: 05/28/2020 3:30 PM Medical Record Number: [id number] Patient Account Number: [id number] Date of Birth/Sex: [birth year] (69 y.o. M) Treating RN: Dolan Amen Primary Care Niam Nepomuceno: Merrie Roof Other Clinician: Referring Eydan Chianese: Merrie Roof Treating Genevie Elman/Extender: Jeri Cos Weeks in Treatment: 45 Edema Assessment Assessed: [Left: Yes] [Right: No] Edema: [Left: Ye] [Right: s] Calf Left: Right: Point of Measurement: 38 cm From Medial Instep 42 cm Ankle Left: Right: Point of Measurement: 12 cm From Medial Instep 32.2 cm Vascular Assessment Pulses: Dorsalis Pedis Palpable: [Left:Yes] Electronic Signature(s) Signed: 05/28/2020 4:49:21 PM By: Georges Mouse, Minus Breeding Entered By: Georges Mouse, Minus Breeding on 05/28/2020 15:18:45 Timothy Gibson ([id number]) -------------------------------------------------------------------------------- Multi Wound Chart Details Patient Name: Timothy Gibson Date of Service: 05/28/2020 3:30 PM Medical Record Number: [id number] Patient Account Number: [id number] Date of Birth/Sex: [birth year] (69 y.o. 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] Comorbid History: [3:Lymphedema, Arrhythmia,  Hypertension, Type II Diabetes, End Stage Renal Disease, Rheumatoid Arthritis, Osteoarthritis, Confinement Anxiety] [N/A:N/A] Date Acquired: [3:06/19/2019] [N/A:N/A] Weeks of Treatment: [3:45] [N/A:N/A] Wound Status: [3:Open] [N/A:N/A] Measurements L x W x D (cm) [3:0.1x0.1x0.1] [N/A:N/A] Area (cm) : [3:0.008] [N/A:N/A] Volume (cm) : [3:0.001] [N/A:N/A] % Reduction in Area: [3:99.70%] [N/A:N/A] % Reduction in Volume: [3:99.60%] [N/A:N/A] Classification: [3:Grade 2] [N/A:N/A] Exudate Amount: [3:None Present] [N/A:N/A] Wound Margin: [3:Thickened] [N/A:N/A] Granulation Amount: [3:Large (67-100%)] [N/A:N/A] Granulation Quality: [3:Pink] [N/A:N/A] Necrotic Amount: [3:None Present (0%)] [N/A:N/A] Exposed Structures: [3:Fat Layer (Subcutaneous Tissue): N/A Yes Fascia:  No Tendon: No Muscle: No Joint: No Bone: No Medium (34-66%)] [N/A:N/A] Treatment Notes Electronic Signature(s) Signed: 05/31/2020 10:30:31 AM By: Gretta Cool, BSN, RN, CWS, Kim RN, BSN Entered By: Gretta Cool, BSN, RN, CWS, Kim on 05/28/2020 15:30:35 COULTER, OLDAKER ([id number]) -------------------------------------------------------------------------------- [place] Details Patient Name: Timothy Gibson Date of Service: 05/28/2020 3:30 PM Medical Record Number: [id number] Patient Account Number: [id number] Date of Birth/Sex: September 01, 1950 (69 y.o. M) Treating RN: Cornell Barman Primary Care Leva Baine: Merrie Roof Other Clinician: Referring Chanika Byland: Merrie Roof Treating Marvette Schamp/Extender: Skipper Cliche in Treatment: 45 Active Inactive Electronic Signature(s) Signed: 05/31/2020 10:30:31 AM By: Gretta Cool, BSN, RN, CWS, Kim RN, BSN Entered By: Gretta Cool, BSN, RN, CWS, Kim on 05/28/2020 15:29:05 HENRIK, ORIHUELA ([id number]) -------------------------------------------------------------------------------- Pain Assessment Details Patient Name: Timothy Gibson Date of Service: 05/28/2020 3:30 PM Medical Record Number:  [id number] Patient Account Number: [id number] Date of Birth/Sex: Feb 12, 1951 (69 y.o. M) Treating RN: Dolan Amen Primary Care Sarrinah Gardin: Merrie Roof Other Clinician: Referring Genie Wenke: Merrie Roof Treating Alessa Mazur/Extender: Skipper Cliche in Treatment: 45 Active Problems Location of Pain Severity and Description of Pain Patient Has Paino No Site Locations Rate the pain. Current Pain Level: 0 Pain Management and Medication Current Pain Management: Electronic Signature(s) Signed: 05/28/2020 4:49:21 PM By: Georges Mouse, Minus Breeding Entered By: Georges Mouse, Minus Breeding on 05/28/2020 15:11:14 Timothy Gibson ([id number]) -------------------------------------------------------------------------------- Patient/Caregiver Education Details Patient Name: Timothy Gibson Date of Service: 05/28/2020 3:30 PM Medical Record Number: [id number] Patient Account Number: [id number] Date of Birth/Gender: [birth year] (69 y.o. M) Treating RN: Cornell Barman Primary Care Physician: Merrie Roof Other Clinician: Referring Physician: Merrie Roof Treating Physician/Extender: Skipper Cliche in Treatment: 26 Education Assessment Education Provided To: Patient Education Topics Provided Wound/Skin Impairment: Handouts: Caring for Your Ulcer Methods: Demonstration, Explain/Verbal Responses: State content correctly Electronic Signature(s) Signed: 05/31/2020 10:30:31 AM By: Gretta Cool, BSN, RN, CWS, Kim RN, BSN Entered By: Gretta Cool, BSN, RN, CWS, Kim on 05/28/2020 15:31:59 Timothy Gibson ([id number]) -------------------------------------------------------------------------------- Wound Assessment Details Patient Name: Timothy Gibson Date of Service: 05/28/2020 3:30 PM Medical Record Number: [id number] Patient Account Number: [id number] Date of Birth/Sex: 04-17-1951 (69 y.o. M) Treating RN: Dolan Amen Primary Care Carl Bleecker: Merrie Roof Other Clinician: Referring Fraidy Mccarrick: Merrie Roof Treating  Loula Marcella/Extender: Skipper Cliche in Treatment: 15 Wound Status Wound Number: 3 Primary Diabetic Wound/Ulcer of the Lower Extremity Etiology: Wound Location: Left Toe Great Wound Open Wounding Event: Gradually Appeared Status: Date Acquired: 06/19/2019 Comorbid Lymphedema, Arrhythmia, Hypertension, Type II Weeks Of Treatment: 45 History: Diabetes, End Stage Renal Disease, Rheumatoid Arthritis, Clustered Wound: No Osteoarthritis, Confinement Anxiety Wound Measurements Length: (cm) 0.1 Width: (cm) 0.1 Depth: (cm) 0.1 Area: (cm) 0.008 Volume: (cm) 0.001 % Reduction in Area: 99.7% % Reduction in Volume: 99.6% Epithelialization: Medium (34-66%) Tunneling: No Undermining: No Wound Description Classification: Grade 2 Wound Margin: Thickened Exudate Amount: None Present Foul Odor After Cleansing: No Slough/Fibrino No Wound Bed Granulation Amount: Large (67-100%) Exposed Structure Granulation Quality: Pink Fascia Exposed: No Necrotic Amount: None Present (0%) Fat Layer (Subcutaneous Tissue) Exposed: Yes Tendon Exposed: No Muscle Exposed: No Joint Exposed: No Bone Exposed: No Treatment Notes Wound #3 (Left Toe Great) Notes Gentamycin cream, gauze, conform, netting ,offloading shoe Electronic Signature(s) Signed: 05/28/2020 4:49:21 PM By: Charlett Nose Signed: 05/31/2020 10:30:31 AM By: Gretta Cool, BSN, RN, CWS, Kim RN, BSN Entered By: Gretta Cool, BSN, RN, CWS, Kim on 05/28/2020 15:30:15 RAYJON, WERY ([id number]) -------------------------------------------------------------------------------- Vitals Details Patient Name: Timothy Gibson Date of Service: 05/28/2020 3:30 PM Medical Record Number:  [id number] Patient Account Number: [id number] Date of Birth/Sex: 1950/08/21 (69 y.o. M) Treating RN: Dolan Amen Primary Care Darlean Warmoth: Merrie Roof Other Clinician: Referring Terryn Redner: Merrie Roof Treating Chanta Bauers/Extender: Skipper Cliche in Treatment:  45 Vital Signs Time Taken: 15:07 Temperature (F): 97.9 Height (in): 76 Pulse (bpm): 79 Weight (lbs): 244 Respiratory Rate (breaths/min): 18 Body Mass Index (BMI): 29.7 Blood Pressure (mmHg): 166/83 Reference Range: 80 - 120 mg / dl Electronic Signature(s) Signed: 05/28/2020 4:49:21 PM By: Georges Mouse, Minus Breeding Entered By: Georges Mouse, Minus Breeding on 05/28/2020 15:11:05

## 2020-05-28 NOTE — Progress Notes (Addendum)
Timothy, Gibson ([id number]) Visit Report for 05/28/2020 Chief Complaint Document Details Patient Name: Timothy Gibson, Timothy Gibson Date of Service: 05/28/2020 3:30 PM Medical Record Number: [id number] Patient Account Number: [id number] Date of Birth/Sex: 09-09-1950 (69 y.o. M) Treating RN: Cornell Barman Primary Care Provider: Merrie Roof Other Clinician: Referring Provider: Merrie Roof Treating Provider/Extender: Skipper Cliche in Treatment: 83 Information Obtained from: Patient Chief Complaint Left Great Toe Electronic Signature(s) Signed: 05/28/2020 3:05:10 PM By: Worthy Keeler PA-C Entered By: Worthy Keeler on 05/28/2020 15:05:09 Gibson, Timothy ([id number]) -------------------------------------------------------------------------------- HPI Details Patient Name: Timothy Gibson Date of Service: 05/28/2020 3:30 PM Medical Record Number: [id number] Patient Account Number: [id number] Date of Birth/Sex: 1951/01/01 (69 y.o. M) Treating RN: Cornell Barman Primary Care Provider: Merrie Roof Other Clinician: Referring Provider: Merrie Roof Treating Provider/Extender: Skipper Cliche in Treatment: 52 History of Present Illness HPI Description: 11/27/17 on evaluation today patient presents for initial evaluation concerning an issue that he has been having with his right great toe which began last Thursday 11/22/17. He has a history of diabetes mellitus type to which he has had for greater than 30 years currently he is on insulin. Subsequently he also has a history of hypertension. On physical exam inspection is also appears he may have some peripheral vascular disease with his ABI's being noncompressible bilaterally. He is on dialysis as well and this is on Monday, Wednesday, and Friday. Patient was hospitalized back in January/February 2019 although this was more for stomach/back pain though they never told me exactly what was going on. This is according to the patient. Subsequently the  ulcer which is between the third and fourth toe when space of the right foot as well is on the plantar aspect of the fourth toe is due to him having cleaned his toes this morning and he states that his finger which is large split the toe tissue in between causing the wounds that we currently see. With that being said he states this has happened before I explained I would definitely recommend that he not do this any longer. He can use a small soft washcloth to clean between the toes without causing trauma. Subsequently the right great toe hatchery does show evidence of necrotic tissue present on the surface of the wound specifically there is callous and dead skin surrounding which is trapping fluid causing problems as far as the wound is concerned. The surface of the wound does show slough although due to his vascular flow I'm not going to sharply debride this today I think I will selectively debride away the necrotic skin as well is callous from around the surrounding so this will not continue to be a moisture issue. Nonetheless the patient has no pain he does have diabetic neuropathy. 12/06/17-He is here in follow-up evaluation for a right great toe ulcer. He is voicing no complaints or concerns. He continues to infrequently/socially smokes cigars with no desire to quit. He has been compliant with offloading, using open toed surgical shoe; has been compliant using Santyl daily. He continues on clindamycin although takes it inconsistently secondary to indigestion. The plain film x-ray performed on 5/23 impression: Soft tissue swelling of the left first toe and is noted with probable irregular lucency involving distal tuft of first distal phalanx concerning for osteomyelitis. MRI may be performed for further evaluation; MRI ordered. The vascular evaluation performed on 5/24 field non- compressible ABIs bilaterally and reduced TBI bilaterally suggesting significant tibial disease. He will be referred to  vascular medicine  for further evaluation. 12/13/17-He is here in follow-up evaluation for right great toe ulcer. He has appointment next Thursday for the MRI and vascular evaluation. Wound culture obtained last week grew abundant Klebsiella oxytoca (multidrug sensitivity), and abundant enteric coccus faecalis (sensitive to ampicillin). Ampicillin and Cipro were called in, along with a probiotic. In light of his appointments next Thursday he will follow-up in 2 weeks, we will continue with Santyl 12/27/17-He is here for evaluation for a right great toe ulcer. 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 He did have evaluation by vascular medicine, Dr. Ronalee Belts, on 6/13, at that appointment it was decided for him to undergo angiography with possible intervention but he refused and is still considering. If he chooses to have it done he will contact the office. He has not heard back from either ID offices that he was referred to two weeks ago: [place] and [place]. there is improvement in both appearance and measurement to the ulcer. We will extend antibiotic therapy (amoxicillin 500 every 12, Cipro 500 daily) for additional 2 weeks pending infectious disease consult, he will continue with Santyl daily. He was reminded to continue with probiotic therapy while on oral antibiotics; he denies any GI disturbance. 01/03/18-He is here in follow-up evaluation for right great toe ulcer. He is decided to not undergo any vascular intervention at this time. He was established with [facility] infectious disease (Dr. Linus Salmons) last week initiated on vancomycin and cefazolin with dialysis treatment for 6 weeks. We will continue with current treatment plan with Santyl and offloading and he will follow-up in 2 weeks 01/17/18-He is seen in follow-up evaluation for right great toe ulcer. There is improvement in appearance with less nonviable tissue  present. We will continue with same treatment plan he will follow-up next week. He is tolerating IV antibiotics without any complications 6/37/85-YI is seen in follow up evaluation for right great toe ulcer. There continues to be improvement. He is tolerating IV antibiotics with hemodialysis, completion date of 7/31. We will transition to collagen and and follow-up next week 02/07/18-He is here in follow up for a right great toe ulcer. There is red granulation tissue throughout the wound, improved in appearance. He completed antibiotics yesterday. He saw podiatry last Thursday for nail trimming, at that appointment he periwound callus was trimmed and a culture was obtained; culture negative. He does not have a follow up appointment with infectious disease. We will submit for grafix. 02/14/18-He is here in follow-up evaluation for a right great toe ulcer. There is slow improvement, red granulation tissue throughout. The insurance approval for grafix is pending. We will continue with collagen and he'll follow-up next week 02/21/18-He is seen in follow-up evaluation for right great toe ulcer. There is improvement with red granulation tissue throughout. He was approved for oasis and this was placed today. He will follow up next week 02/28/18-He is seen in follow-up evaluation for right great toe ulcer. The wound is stable, oasis applied and he'll follow-up next week 03/07/18-He is seen in follow-up violation of her right great toe ulcer. The wound is dry, no evidence of drainage. The wound appears healed today. He was painted with Betadine, instructed to paint with Betadine daily, cover with band-aid for the next week and then cover with band-aid for the following week continue with surgical shoe. He was encouraged to contact the clinic with any evidence of drainage, or change in appearance to toe/wound. He will  be discharged from wound care services Readmission: 07/14/18 on evaluation today patient presents for  initial evaluation our clinic concerning issues that he is having with his left great toe. We previously saw him in 2019 for his right great toe which was actually worse at that time and subsequently was able to be completely healed in the end although it did take several months. With that being said the patient is currently having an area on his left great toe that occurred initially as result of a blister that came up he's not really sure why or how. Subsequently this was initially treated by his podiatrist though the recommendation there was apparently amputation according to the patient and his daughter who was present with him today. With that being said the patient did not want to proceed with amputation and would like to try to perform wound care and get this to heal without having to go down that road. We were able to do that with his other toe and so he would like to at least give that a try before going forward with amputation. Again I completely understand his concern in this regard. With that being said I did discuss with the patient obviously that it's always a chance that we cannot get this to heal with normal wound care measures but obviously we will give it our best shot. He does actually have an appointment scheduled for later today with the vascular specialist in order to evaluate his blood flow. That was at the recommendation of his podiatrist as well. Obviously if he is having good arterial flow and nonetheless is still experiencing issues with having the wound delay in healing then it may simply be a matter of we need to initiate more aggressive wound to therapy in order to see if we can get this to heal. No fevers, chills, SHAINE, NEWMARK. ([id number]) nausea, or vomiting noted at this time. The patient notes that this woman has been present for approximately three weeks. He is not a smoker. His most recent hemoglobin A1c was 6.2 on 06/18/19. 07/17/2019 on evaluation today patient  appears unfortunately to not be doing as well. He did see vascular I did review the note as well. That was on 07/15/2019. Subsequently there can actually be taking him to the OR for an angiogram due to poor arterial flow into this extremity. Apparently the atherosclerotic changes were severe. The patient is stated to be at risk for limb loss. Nonetheless the good news is this is being scheduled fairly quickly he will have the surgery/procedure next Thursday. With that being said I am going also go ahead based on the results of his x-ray which showed some chance of osteomyelitis in the distal portion of the toe get the patient set up for an MRI to further evaluate for the possibility of osteomyelitis obviously if he does have osteomyelitis we need to know so that we can address this appropriately. 07/29/2019 upon evaluation today patient appears to be doing better in general with regard to his toe ulcer. He does have much better blood flow you can actually feel a palpable pulse which appears to be very strong at this time. I did review his arterial intervention/angiogram which did show that he had evidence of sufficient arterial flow restoration at this point. He did have occlusions that were 60 and 80% that residually were only 10 and 25% with no limiting flow. This is excellent news and hopefully he will continue to show signs of improvement in  light of the improved vascular status. With that being said the toe mainly shows somewhat of an eschar today I think that this is fairly stable and as such probably would be best not to really do much in the way of debridement at this point I do believe Betadine would likely be a good option. 08/07/2019 on evaluation today patient actually appears to be doing about the same. The one difference is the eschar that we were just seeing if we can maintain is actually starting to loosen up and leak around the edges I think it is time to actually go ahead and remove this  today. Is also no longer stable is very soft. Nonetheless the patient is okay with proceeding with debridement at this point. Fortunately there is no signs of active infection. No fevers, chills, nausea, vomiting, or diarrhea. The patient states that he took his last antibiotic that he had last night. He would need a refill as he will not be seeing infectious disease until February 2. Nonetheless obviously I do not want him to have any break in therapy especially when he is doing as well as he is currently. 08/14/2019 on evaluation today patient appears to be doing quite well all things considered in regard to his toe. I do feel like he is making progress and though this is not completely healed by any means we still have some ways to go I feel like he is making progress which is excellent. There is no sign of active infection at this time systemically. He does have osteomyelitis of the distal phalanx of his great toe on the left. He did see Dr. Linus Salmons. That is the infectious disease doctor in [place] where he was referred. Dr. Linus Salmons apparently according to the note did not feel like the patient's MRI revealed osteomyelitis but just reactive marrow and subsequently did not recommend any antibiotic therapy whatsoever. To be peripherally honest I am not really in agreement with this based on the MRI results and what we are seeing and I think he has been responding at least decently well to oral medication as well. Nonetheless we may want to check into the possibility of IV antibiotics being administered at the dialysis center. I will discuss this with Dr. Dellia Nims further. 08/21/2019 upon evaluation today patient appears at this point to be making some progress with regard to his wounds. He has been tolerating the dressing changes without complication. Fortunately there is no evidence of active infection at this time. Overall I feel like he is doing well with the oral antibiotics I discussed with Dr.  Dellia Nims the possibility of doing IV antibiotic therapy versus continuing with the oral. His opinion was if the patient is doing well with oral to maybe stick with that. Obviously we can initiate additional Cipro as well just to help ensure that there is nothing worsening here. The Cipro should be beneficial for him as well. That helps cover more gram-negative's were is at the doxycycline is more gram- positive's. 09/02/2019 on evaluation today patient appears to be doing fairly well upon inspection today. With that being said though the patient is continuing to do well he does also continue to develop issues with buildup of necrotic tissue on the distal portion of his toe he does have good arterial flow however. For that reason I am going to suggest that what we may need to do is consider using something such as Santyl to try to keep this free and clear. Also discussed in greater  detail HBO therapy with him today please see plan for additional recommendations and results of this discussion. 09/11/2019 upon evaluation today patient appears to be doing well with regard to his toe ulcer. This actually appears to be much better today using the Santyl than it was previous. Fortunately there is no signs of active infection. No fevers, chills, nausea, vomiting, or diarrhea. 09/18/2019 upon evaluation today patient appears to be doing much better in regard to his wound in general. The Santyl was doing a good job at loosening stuff up here for [place] which is excellent news. With that being said he is still taking the oral antibiotic. I sent this in last for him on 09/03/2019. This was the doxycycline. When this runs out he will actually be complete as far as the treatment is concerned. With that being said I am getting continue the Cipro for 1 additional month. Overall his wound seems to be doing quite well. 09/25/2019 upon evaluation today patient appears to be doing well with regard to his toe ulcer. This is measuring  much better and overall seems to be making good progress. Fortunately there is no signs of active infection at this time. No fevers, chills, nausea, vomiting, or diarrhea. 10/02/2019 upon evaluation today patient appears to be doing well with regard to his wound. He has been tolerating the dressing changes without complication. Fortunately there is no signs of active infection at this time. No fevers, chills, nausea, vomiting, or diarrhea. Overall I am extremely pleased with how things seem to be progressing. I think the wound bed is at the point now where we can likely proceed with additional measures to try to get this healed more quickly I think Dermagraft may be a good possibility for the patient. We can definitely look into seeing about approval for this. 10/09/2019 upon evaluation today patient's wound actually appears to be doing well although it still is progressing somewhat slowly compared to what I would like to see. With that being said I did order Apligraf for the patient which I felt would keep the wound nice and moist without allowing it to dry out and hopefully allow this to heal much more effectively and quickly. The patient actually did obtain approval and therefore we can apply that today. 10/23/2019 upon evaluation today patient's wound actually appears to be doing significantly better with regard to his toe ulcer. The tissue at the base of the wound is greatly improved and overall I am very pleased with the progress made. We did use Apligraf 2 weeks ago on the toe that did excellent for him much it has completely dissolved after as of the 2 weeks. They only had to change the dressing a few times due to drainage through. Obviously they left the Steri-Strips and Mepitel in place. Unfortunately we do not have the Apligraf available today for application regard therefore have to use a different product here in the clinic today and then order the Apligraf for next Thursday. That will be his  second application. 4/22; patient arrived with overhanging skin and subcutaneous tissue on the wound margins this was removed. Apligraf #2 applied 11/13/2019 upon evaluation today patient actually appears to be doing well with regard to his foot ulcer. He has been tolerating the dressing changes without complication. The overall wound bed appears to be healthier and has filled in as well as not nearly as deep as it was the tissue is also better quality. Overall I am extremely pleased with how things seem to  be progressing. The patient is likewise very happy in this regard. 5/20; wound is measuring smaller had Apligraf #3 last time. Apligraf #4 today HAGOP, MCCOLLAM ([id number]) 12/12/2019 upon evaluation today patient actually appears to be doing quite well with regard to his wound in my opinion. The ulcer is significantly smaller compared to last time I saw him although that has been several weeks. I do believe the Apligraf has been beneficial he has 1 remaining today this will be application #5. 9/56/2130 upon evaluation today patient appears to be doing well with regard to his toe ulcer. He is measuring about the same today but he has come a long way from where this started. Fortunately there is no evidence of active infection which is great news and overall I am very pleased with the progress that is been made. Nonetheless at this point we have completed the Apligraf course he did see improvement through this but I am going to switch to something different as of today. 01/08/2020 upon evaluation today patient appears to be doing much better in regards to his toe ulcer this is measuring smaller and overall very pleased with the progress. The Hydrofera Blue seems to be doing a great job. Fortunately there is no evidence of active infection at this time. No fevers, chills, nausea, vomiting, or diarrhea. 01/29/2020 upon evaluation today patient appears to be doing okay in regard to his toe ulcer. The  main issue I see is that he actually has trouble right now with callus growing and covering over the wound bed. This prevents him from being able to get a dressing in contact with the wound bed and subsequently fluid collection underneath. I think that we probably need to see him more frequently to keep this pared down and debrided to the point to allow the dressings to effectively heal this wound. I discussed that with the patient today. 02/05/2020 on evaluation today patient appears to be doing well with regard to his wounds currently. In fact he just has 1 on his toe remaining at this point he seems to be doing quite well in my opinion. I am very pleased with the overall appearance today. There is no signs of active infection I do think keeping the callus trimmed down has been of great benefit as far as healing is concerned. I do not think we need to go 3 weeks out like we were in the past I believe that is been somewhat more detrimental than good for him. 02/19/2020 upon evaluation today patient appears to be doing better in regard to his toe ulcer still there is callus buildup around the top of the toe which unfortunately is I think causing some issues here with trapping a little fluid underneath them after cleaning this away. Fortunately there is no evidence of active infection at this time. Overall I feel like the patient is doing quite well which is great news. 03/04/2020 on evaluation today patient appears to be doing well with regard to his toe ulcer though he still has a lot of drying out of the endoform and a lot of callus buildup as well unfortunately. With that being said I think that he is can require some sharp debridement to clear away some of the callus today. We will see what the wound looks like following. 03/11/2020 on evaluation today patient appears to be doing excellent in regard to his toe ulcer. He did have a lot of callus and even some of the nail, growing over top of  the area  where the wound is trying to heal. For that reason I did actually trim back some of the nail also did go ahead and trim away some of the callus as well. He tolerated all this without complication the wound appears to be doing well and is showing no signs of infection at this point. Overall I am very pleased. 03/18/2020 upon evaluation today patient appears to be doing well with regard to his toe ulcer. There does not appear to be any significant buildup of callus at this point and overall I do believe that the alginate has been beneficial. Trimming back the toenail also seems to have helped a lot in this regard I think that was causing some rubbing on the end of the toe. Fortunately overall he is doing much better unfortunately he notes that his grandson has been placed on hospice he is 60 and apparently not doing too well. He wants to make a trip to go see him I really think that something he probably should do and not wait for this to try to heal. 04/01/2020 upon evaluation today patient's toe ulcer appears to be doing about the same. He does have some callus buildup slowly he is making progress but were not completely closed as of yet. I am going to need to perform some debridement today to clear this way. 04/15/2020 on evaluation today patient appears to be doing well in general in regard to his wounds there is no signs of active infection in regard to the toe obviously although he does have a lot of drainage which does raise a bit of a question in that regard. I did discuss with him possibilities for we may want to do treatment wise to try to help out with this. With that being said I Georgina Peer probably start with a topical antibiotic and continuing with the current dressing for the time being. 04/29/2020 on evaluation today patient appears to be doing decently well in regard to his toe ulcer. There is no signs of active infection at this time. Fortunately I believe that the patient has made some  progress since last time I saw him. Still this is taken much longer than I would like to see obviously the patient is ready for this to be done. 05/13/2020 upon evaluation today patient appears to be showing some signs of improvement with regard to his toe ulcer this is great news and overall very pleased in that regard. There is no evidence of active infection currently which is great news as well. No fevers, chills, nausea, vomiting, or diarrhea. 05/20/2020 on evaluation today patient appears to be doing actually very well in regard to his toe ulcer. He did have some dry skin buildup on the surface of the wound along with some drainage and dressing material. I remove this and the wound underneath appears to be doing significantly better. There is a lot of new skin growth and I am very pleased. With that being said I think that we may want to just use a little bit of antibiotic cream over this for the next week and see if we can get this to close. 05/28/2020 upon evaluation today patient appears to be doing well with regard to his toe ulcer. Fortunately there is no signs of active infection at this time. No fevers, chills, nausea, vomiting, or diarrhea. I do believe this is very close to closure but not quite today. Electronic Signature(s) Signed: 05/28/2020 3:34:00 PM By: Worthy Keeler PA-C Entered By:  Worthy Keeler on 05/28/2020 15:33:59 LIMUEL, NIEBLAS ([id number]) -------------------------------------------------------------------------------- Physical Exam Details Patient Name: DELONTAE, LAMM Date of Service: 05/28/2020 3:30 PM Medical Record Number: [id number] Patient Account Number: [id number] Date of Birth/Sex: 26-Jul-1950 (69 y.o. M) Treating RN: Cornell Barman Primary Care Provider: Merrie Roof Other Clinician: Referring Provider: Merrie Roof Treating Provider/Extender: Skipper Cliche in Treatment: 74 Constitutional Well-nourished and well-hydrated in no acute  distress. Respiratory normal breathing without difficulty. Psychiatric this patient is able to make decisions and demonstrates good insight into disease process. Alert and Oriented x 3. pleasant and cooperative. Notes Patient's wound currently showed signs of good granulation and epithelization there is no significant depth to the wound and overall I feel like he is healing quite nicely. He is very close to complete resolution in my opinion. Electronic Signature(s) Signed: 05/28/2020 3:34:13 PM By: Worthy Keeler PA-C Entered By: Worthy Keeler on 05/28/2020 15:34:12 THERMON, ZULAUF ([id number]) -------------------------------------------------------------------------------- Physician Orders Details Patient Name: Timothy Gibson Date of Service: 05/28/2020 3:30 PM Medical Record Number: [id number] Patient Account Number: [id number] Date of Birth/Sex: 1950-12-01 (69 y.o. M) Treating RN: Cornell Barman Primary Care Provider: Merrie Roof Other Clinician: Referring Provider: Merrie Roof Treating Provider/Extender: Skipper Cliche in Treatment: 97 Verbal / Phone Orders: No Diagnosis Coding ICD-10 Coding Code Description [phone] Other chronic osteomyelitis, left ankle and foot E11.621 Type 2 diabetes mellitus with foot ulcer L97.522 Non-pressure chronic ulcer of other part of left foot with fat layer exposed I10 Essential (primary) hypertension Wound Cleansing Wound #3 Left Toe Great o Dial antibacterial soap, wash wounds, rinse and pat dry prior to dressing wounds o May Shower, gently pat wound dry prior to applying new dressing. Anesthetic (add to Medication List) Wound #3 Left Toe Great o Topical Lidocaine 4% cream applied to wound bed prior to debridement (In Clinic Only). Primary Wound Dressing Wound #3 Left Toe Great o Other: - gentamycin cream Secondary Dressing Wound #3 Left Toe Great o Dry Gauze o Conform/Kerlix Dressing Change Frequency Wound #3 Left  Toe Great o Change dressing twice daily. o Other: - As needed Follow-up Appointments Wound #3 Left Toe Great o Return Appointment in 1 week. Off-Loading Wound #3 Left Toe Great o Open toe surgical shoe with peg assist. - left foot Electronic Signature(s) Signed: 05/28/2020 4:40:14 PM By: Worthy Keeler PA-C Signed: 05/31/2020 10:30:31 AM By: Gretta Cool, BSN, RN, CWS, Kim RN, BSN Entered By: Gretta Cool, BSN, RN, CWS, Kim on 05/28/2020 15:31:08 TRAYCEN, GOYER ([id number]) -------------------------------------------------------------------------------- Problem List Details Patient Name: Timothy Gibson Date of Service: 05/28/2020 3:30 PM Medical Record Number: [id number] Patient Account Number: [id number] Date of Birth/Sex: 1950/08/31 (69 y.o. M) Treating RN: Cornell Barman Primary Care Provider: Merrie Roof Other Clinician: Referring Provider: Merrie Roof Treating Provider/Extender: Skipper Cliche in Treatment: 45 Active Problems ICD-10 Encounter Code Description Active Date MDM Diagnosis [id number] Other chronic osteomyelitis, left ankle and foot 07/29/2019 No Yes E11.621 Type 2 diabetes mellitus with foot ulcer 07/15/2019 No Yes L97.522 Non-pressure chronic ulcer of other part of left foot with fat layer 07/15/2019 No Yes exposed [place]linger (primary) hypertension 07/15/2019 No Yes Inactive Problems Resolved Problems Electronic Signature(s) Signed: 05/28/2020 3:05:03 PM By: Worthy Keeler PA-C Entered By: Worthy Keeler on 05/28/2020 15:05:02 Timothy Gibson ([id number]) -------------------------------------------------------------------------------- Progress Note Details Patient Name: Timothy Gibson Date of Service: 05/28/2020 3:30 PM Medical Record Number: [id number] Patient Account Number: [id number] Date of Birth/Sex: 04-Oct-1950 (69 y.o. M) Treating RN: Cornell Barman Primary  Care Provider: Merrie Roof Other Clinician: Referring Provider: Merrie Roof Treating  Provider/Extender: Skipper Cliche in Treatment: 74 Subjective Chief Complaint Information obtained from Patient Left Great Toe History of Present Illness (HPI) 11/27/17 on evaluation today patient presents for initial evaluation concerning an issue that he has been having with his right great toe which began last Thursday 11/22/17. He has a history of diabetes mellitus type to which he has had for greater than 30 years currently he is on insulin. Subsequently he also has a history of hypertension. On physical exam inspection is also appears he may have some peripheral vascular disease with his ABI's being noncompressible bilaterally. He is on dialysis as well and this is on Monday, Wednesday, and Friday. Patient was hospitalized back in January/February 2019 although this was more for stomach/back pain though they never told me exactly what was going on. This is according to the patient. Subsequently the ulcer which is between the third and fourth toe when space of the right foot as well is on the plantar aspect of the fourth toe is due to him having cleaned his toes this morning and he states that his finger which is large split the toe tissue in between causing the wounds that we currently see. With that being said he states this has happened before I explained I would definitely recommend that he not do this any longer. He can use a small soft washcloth to clean between the toes without causing trauma. Subsequently the right great toe hatchery does show evidence of necrotic tissue present on the surface of the wound specifically there is callous and dead skin surrounding which is trapping fluid causing problems as far as the wound is concerned. The surface of the wound does show slough although due to his vascular flow I'm not going to sharply debride this today I think I will selectively debride away the necrotic skin as well is callous from around the surrounding so this will not continue to  be a moisture issue. Nonetheless the patient has no pain he does have diabetic neuropathy. 12/06/17-He is here in follow-up evaluation for a right great toe ulcer. He is voicing no complaints or concerns. He continues to infrequently/socially smokes cigars with no desire to quit. He has been compliant with offloading, using open toed surgical shoe; has been compliant using Santyl daily. He continues on clindamycin although takes it inconsistently secondary to indigestion. The plain film x-ray performed on 5/23 impression: Soft tissue swelling of the left first toe and is noted with probable irregular lucency involving distal tuft of first distal phalanx concerning for osteomyelitis. MRI may be performed for further evaluation; MRI ordered. The vascular evaluation performed on 5/24 field non- compressible ABIs bilaterally and reduced TBI bilaterally suggesting significant tibial disease. He will be referred to vascular medicine for further evaluation. 12/13/17-He is here in follow-up evaluation for right great toe ulcer. He has appointment next Thursday for the MRI and vascular evaluation. Wound culture obtained last week grew abundant Klebsiella oxytoca (multidrug sensitivity), and abundant enteric coccus faecalis (sensitive to ampicillin). Ampicillin and Cipro were called in, along with a probiotic. In light of his appointments next Thursday he will follow-up in 2 weeks, we will continue with Santyl 12/27/17-He is here for evaluation for a right great toe ulcer. 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 He did have evaluation by vascular medicine, Dr. Ronalee Belts, on 6/13, at that appointment it  was decided for him to undergo angiography with possible intervention but he refused and is still considering. If he chooses to have it done he will contact the office. He has not heard back from either ID offices that he was referred to  two weeks ago: [place] and [place]. there is improvement in both appearance and measurement to the ulcer. We will extend antibiotic therapy (amoxicillin 500 every 12, Cipro 500 daily) for additional 2 weeks pending infectious disease consult, he will continue with Santyl daily. He was reminded to continue with probiotic therapy while on oral antibiotics; he denies any GI disturbance. 01/03/18-He is here in follow-up evaluation for right great toe ulcer. He is decided to not undergo any vascular intervention at this time. He was established with [facility] infectious disease (Dr. Linus Salmons) last week initiated on vancomycin and cefazolin with dialysis treatment for 6 weeks. We will continue with current treatment plan with Santyl and offloading and he will follow-up in 2 weeks 01/17/18-He is seen in follow-up evaluation for right great toe ulcer. There is improvement in appearance with less nonviable tissue present. We will continue with same treatment plan he will follow-up next week. He is tolerating IV antibiotics without any complications 1/61/09-UE is seen in follow up evaluation for right great toe ulcer. There continues to be improvement. He is tolerating IV antibiotics with hemodialysis, completion date of 7/31. We will transition to collagen and and follow-up next week 02/07/18-He is here in follow up for a right great toe ulcer. There is red granulation tissue throughout the wound, improved in appearance. He completed antibiotics yesterday. He saw podiatry last Thursday for nail trimming, at that appointment he periwound callus was trimmed and a culture was obtained; culture negative. He does not have a follow up appointment with infectious disease. We will submit for grafix. 02/14/18-He is here in follow-up evaluation for a right great toe ulcer. There is slow improvement, red granulation tissue throughout. The insurance approval for grafix is pending. We will continue with collagen and he'll follow-up  next week 02/21/18-He is seen in follow-up evaluation for right great toe ulcer. There is improvement with red granulation tissue throughout. He was approved for oasis and this was placed today. He will follow up next week 02/28/18-He is seen in follow-up evaluation for right great toe ulcer. The wound is stable, oasis applied and he'll follow-up next week 03/07/18-He is seen in follow-up violation of her right great toe ulcer. The wound is dry, no evidence of drainage. The wound appears healed today. He was painted with Betadine, instructed to paint with Betadine daily, cover with band-aid for the next week and then cover with band-aid for the following week continue with surgical shoe. He was encouraged to contact the clinic with any evidence of drainage, or change in appearance to toe/wound. He will be discharged from wound care services Readmission: 07/14/18 on evaluation today patient presents for initial evaluation our clinic concerning issues that he is having with his left great toe. We previously saw him in 2019 for his right great toe which was actually worse at that time and subsequently was able to be completely healed in the end although it did take several months. With that being said the patient is currently having an area on his left great toe that occurred initially as result of a blister that came up he's not really sure why or how. Subsequently this was initially treated by his podiatrist though the recommendation there was apparently amputation according to the patient  and his daughter who was present with him today. With that being said the patient did not want to proceed with amputation and would like to try to perform wound care and get this to heal without having to go down that road. We were able to do that with his other toe and so he would like to at least give that a try before going forward with amputation. VELTON, ROSELLE ([id number]) Again I completely understand his  concern in this regard. With that being said I did discuss with the patient obviously that it's always a chance that we cannot get this to heal with normal wound care measures but obviously we will give it our best shot. He does actually have an appointment scheduled for later today with the vascular specialist in order to evaluate his blood flow. That was at the recommendation of his podiatrist as well. Obviously if he is having good arterial flow and nonetheless is still experiencing issues with having the wound delay in healing then it may simply be a matter of we need to initiate more aggressive wound to therapy in order to see if we can get this to heal. No fevers, chills, nausea, or vomiting noted at this time. The patient notes that this woman has been present for approximately three weeks. He is not a smoker. His most recent hemoglobin A1c was 6.2 on 06/18/19. 07/17/2019 on evaluation today patient appears unfortunately to not be doing as well. He did see vascular I did review the note as well. That was on 07/15/2019. Subsequently there can actually be taking him to the OR for an angiogram due to poor arterial flow into this extremity. Apparently the atherosclerotic changes were severe. The patient is stated to be at risk for limb loss. Nonetheless the good news is this is being scheduled fairly quickly he will have the surgery/procedure next Thursday. With that being said I am going also go ahead based on the results of his x-ray which showed some chance of osteomyelitis in the distal portion of the toe get the patient set up for an MRI to further evaluate for the possibility of osteomyelitis obviously if he does have osteomyelitis we need to know so that we can address this appropriately. 07/29/2019 upon evaluation today patient appears to be doing better in general with regard to his toe ulcer. He does have much better blood flow you can actually feel a palpable pulse which appears to be very  strong at this time. I did review his arterial intervention/angiogram which did show that he had evidence of sufficient arterial flow restoration at this point. He did have occlusions that were 60 and 80% that residually were only 10 and 25% with no limiting flow. This is excellent news and hopefully he will continue to show signs of improvement in light of the improved vascular status. With that being said the toe mainly shows somewhat of an eschar today I think that this is fairly stable and as such probably would be best not to really do much in the way of debridement at this point I do believe Betadine would likely be a good option. 08/07/2019 on evaluation today patient actually appears to be doing about the same. The one difference is the eschar that we were just seeing if we can maintain is actually starting to loosen up and leak around the edges I think it is time to actually go ahead and remove this today. Is also no longer stable is very soft. Nonetheless  the patient is okay with proceeding with debridement at this point. Fortunately there is no signs of active infection. No fevers, chills, nausea, vomiting, or diarrhea. The patient states that he took his last antibiotic that he had last night. He would need a refill as he will not be seeing infectious disease until February 2. Nonetheless obviously I do not want him to have any break in therapy especially when he is doing as well as he is currently. 08/14/2019 on evaluation today patient appears to be doing quite well all things considered in regard to his toe. I do feel like he is making progress and though this is not completely healed by any means we still have some ways to go I feel like he is making progress which is excellent. There is no sign of active infection at this time systemically. He does have osteomyelitis of the distal phalanx of his great toe on the left. He did see Dr. Linus Salmons. That is the infectious disease doctor in  [place] where he was referred. Dr. Linus Salmons apparently according to the note did not feel like the patient's MRI revealed osteomyelitis but just reactive marrow and subsequently did not recommend any antibiotic therapy whatsoever. To be peripherally honest I am not really in agreement with this based on the MRI results and what we are seeing and I think he has been responding at least decently well to oral medication as well. Nonetheless we may want to check into the possibility of IV antibiotics being administered at the dialysis center. I will discuss this with Dr. Dellia Nims further. 08/21/2019 upon evaluation today patient appears at this point to be making some progress with regard to his wounds. He has been tolerating the dressing changes without complication. Fortunately there is no evidence of active infection at this time. Overall I feel like he is doing well with the oral antibiotics I discussed with Dr. Dellia Nims the possibility of doing IV antibiotic therapy versus continuing with the oral. His opinion was if the patient is doing well with oral to maybe stick with that. Obviously we can initiate additional Cipro as well just to help ensure that there is nothing worsening here. The Cipro should be beneficial for him as well. That helps cover more gram-negative's were is at the doxycycline is more gram- positive's. 09/02/2019 on evaluation today patient appears to be doing fairly well upon inspection today. With that being said though the patient is continuing to do well he does also continue to develop issues with buildup of necrotic tissue on the distal portion of his toe he does have good arterial flow however. For that reason I am going to suggest that what we may need to do is consider using something such as Santyl to try to keep this free and clear. Also discussed in greater detail HBO therapy with him today please see plan for additional recommendations and results of  this discussion. 09/11/2019 upon evaluation today patient appears to be doing well with regard to his toe ulcer. This actually appears to be much better today using the Santyl than it was previous. Fortunately there is no signs of active infection. No fevers, chills, nausea, vomiting, or diarrhea. 09/18/2019 upon evaluation today patient appears to be doing much better in regard to his wound in general. The Santyl was doing a good job at loosening stuff up here for [place] which is excellent news. With that being said he is still taking the oral antibiotic. I sent this in last for him  on 09/03/2019. This was the doxycycline. When this runs out he will actually be complete as far as the treatment is concerned. With that being said I am getting continue the Cipro for 1 additional month. Overall his wound seems to be doing quite well. 09/25/2019 upon evaluation today patient appears to be doing well with regard to his toe ulcer. This is measuring much better and overall seems to be making good progress. Fortunately there is no signs of active infection at this time. No fevers, chills, nausea, vomiting, or diarrhea. 10/02/2019 upon evaluation today patient appears to be doing well with regard to his wound. He has been tolerating the dressing changes without complication. Fortunately there is no signs of active infection at this time. No fevers, chills, nausea, vomiting, or diarrhea. Overall I am extremely pleased with how things seem to be progressing. I think the wound bed is at the point now where we can likely proceed with additional measures to try to get this healed more quickly I think Dermagraft may be a good possibility for the patient. We can definitely look into seeing about approval for this. 10/09/2019 upon evaluation today patient's wound actually appears to be doing well although it still is progressing somewhat slowly compared to what I would like to see. With that being said I did order Apligraf for  the patient which I felt would keep the wound nice and moist without allowing it to dry out and hopefully allow this to heal much more effectively and quickly. The patient actually did obtain approval and therefore we can apply that today. 10/23/2019 upon evaluation today patient's wound actually appears to be doing significantly better with regard to his toe ulcer. The tissue at the base of the wound is greatly improved and overall I am very pleased with the progress made. We did use Apligraf 2 weeks ago on the toe that did excellent for him much it has completely dissolved after as of the 2 weeks. They only had to change the dressing a few times due to drainage through. Obviously they left the Steri-Strips and Mepitel in place. Unfortunately we do not have the Apligraf available today for application regard therefore have to use a different product here in the clinic today and then order the Apligraf for next Thursday. That will be his second application. 4/22; patient arrived with overhanging skin and subcutaneous tissue on the wound margins this was removed. Apligraf #2 applied EZERIAH, LUTY ([id number]) 11/13/2019 upon evaluation today patient actually appears to be doing well with regard to his foot ulcer. He has been tolerating the dressing changes without complication. The overall wound bed appears to be healthier and has filled in as well as not nearly as deep as it was the tissue is also better quality. Overall I am extremely pleased with how things seem to be progressing. The patient is likewise very happy in this regard. 5/20; wound is measuring smaller had Apligraf #3 last time. Apligraf #4 today 12/12/2019 upon evaluation today patient actually appears to be doing quite well with regard to his wound in my opinion. The ulcer is significantly smaller compared to last time I saw him although that has been several weeks. I do believe the Apligraf has been beneficial he has 1 remaining today  this will be application #5. 10/23/6061 upon evaluation today patient appears to be doing well with regard to his toe ulcer. He is measuring about the same today but he has come a long way from where this  started. Fortunately there is no evidence of active infection which is great news and overall I am very pleased with the progress that is been made. Nonetheless at this point we have completed the Apligraf course he did see improvement through this but I am going to switch to something different as of today. 01/08/2020 upon evaluation today patient appears to be doing much better in regards to his toe ulcer this is measuring smaller and overall very pleased with the progress. The Hydrofera Blue seems to be doing a great job. Fortunately there is no evidence of active infection at this time. No fevers, chills, nausea, vomiting, or diarrhea. 01/29/2020 upon evaluation today patient appears to be doing okay in regard to his toe ulcer. The main issue I see is that he actually has trouble right now with callus growing and covering over the wound bed. This prevents him from being able to get a dressing in contact with the wound bed and subsequently fluid collection underneath. I think that we probably need to see him more frequently to keep this pared down and debrided to the point to allow the dressings to effectively heal this wound. I discussed that with the patient today. 02/05/2020 on evaluation today patient appears to be doing well with regard to his wounds currently. In fact he just has 1 on his toe remaining at this point he seems to be doing quite well in my opinion. I am very pleased with the overall appearance today. There is no signs of active infection I do think keeping the callus trimmed down has been of great benefit as far as healing is concerned. I do not think we need to go 3 weeks out like we were in the past I believe that is been somewhat more detrimental than good for him. 02/19/2020  upon evaluation today patient appears to be doing better in regard to his toe ulcer still there is callus buildup around the top of the toe which unfortunately is I think causing some issues here with trapping a little fluid underneath them after cleaning this away. Fortunately there is no evidence of active infection at this time. Overall I feel like the patient is doing quite well which is great news. 03/04/2020 on evaluation today patient appears to be doing well with regard to his toe ulcer though he still has a lot of drying out of the endoform and a lot of callus buildup as well unfortunately. With that being said I think that he is can require some sharp debridement to clear away some of the callus today. We will see what the wound looks like following. 03/11/2020 on evaluation today patient appears to be doing excellent in regard to his toe ulcer. He did have a lot of callus and even some of the nail, growing over top of the area where the wound is trying to heal. For that reason I did actually trim back some of the nail also did go ahead and trim away some of the callus as well. He tolerated all this without complication the wound appears to be doing well and is showing no signs of infection at this point. Overall I am very pleased. 03/18/2020 upon evaluation today patient appears to be doing well with regard to his toe ulcer. There does not appear to be any significant buildup of callus at this point and overall I do believe that the alginate has been beneficial. Trimming back the toenail also seems to have helped a lot in this regard  I think that was causing some rubbing on the end of the toe. Fortunately overall he is doing much better unfortunately he notes that his grandson has been placed on hospice he is 66 and apparently not doing too well. He wants to make a trip to go see him I really think that something he probably should do and not wait for this to try to heal. 04/01/2020 upon  evaluation today patient's toe ulcer appears to be doing about the same. He does have some callus buildup slowly he is making progress but were not completely closed as of yet. I am going to need to perform some debridement today to clear this way. 04/15/2020 on evaluation today patient appears to be doing well in general in regard to his wounds there is no signs of active infection in regard to the toe obviously although he does have a lot of drainage which does raise a bit of a question in that regard. I did discuss with him possibilities for we may want to do treatment wise to try to help out with this. With that being said I Georgina Peer probably start with a topical antibiotic and continuing with the current dressing for the time being. 04/29/2020 on evaluation today patient appears to be doing decently well in regard to his toe ulcer. There is no signs of active infection at this time. Fortunately I believe that the patient has made some progress since last time I saw him. Still this is taken much longer than I would like to see obviously the patient is ready for this to be done. 05/13/2020 upon evaluation today patient appears to be showing some signs of improvement with regard to his toe ulcer this is great news and overall very pleased in that regard. There is no evidence of active infection currently which is great news as well. No fevers, chills, nausea, vomiting, or diarrhea. 05/20/2020 on evaluation today patient appears to be doing actually very well in regard to his toe ulcer. He did have some dry skin buildup on the surface of the wound along with some drainage and dressing material. I remove this and the wound underneath appears to be doing significantly better. There is a lot of new skin growth and I am very pleased. With that being said I think that we may want to just use a little bit of antibiotic cream over this for the next week and see if we can get this to close. 05/28/2020 upon  evaluation today patient appears to be doing well with regard to his toe ulcer. Fortunately there is no signs of active infection at this time. No fevers, chills, nausea, vomiting, or diarrhea. I do believe this is very close to closure but not quite today. Objective SEVERO, BEBER ([id number]) Constitutional Well-nourished and well-hydrated in no acute distress. Vitals Time Taken: 3:07 PM, Height: 76 in, Weight: 244 lbs, BMI: 29.7, Temperature: 97.9 F, Pulse: 79 bpm, Respiratory Rate: 18 breaths/min, Blood Pressure: 166/83 mmHg. Respiratory normal breathing without difficulty. Psychiatric this patient is able to make decisions and demonstrates good insight into disease process. Alert and Oriented x 3. pleasant and cooperative. General Notes: Patient's wound currently showed signs of good granulation and epithelization there is no significant depth to the wound and overall I feel like he is healing quite nicely. He is very close to complete resolution in my opinion. Integumentary (Hair, Skin) Wound #3 status is Open. Original cause of wound was Gradually Appeared. The wound is located on the Left  Toe Great. The wound measures 0.1cm length x 0.1cm width x 0.1cm depth; 0.008cm^2 area and 0.001cm^3 volume. There is Fat Layer (Subcutaneous Tissue) exposed. There is no tunneling or undermining noted. There is a none present amount of drainage noted. The wound margin is thickened. There is large (67- 100%) pink granulation within the wound bed. There is no necrotic tissue within the wound bed. 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 Anesthetic (add to Medication List): Wound #3 Left Toe Great: Topical Lidocaine 4% cream applied to wound bed prior to debridement (In Clinic Only). Primary Wound Dressing: Wound #3 Left Toe Great: Other: - gentamycin cream Secondary Dressing: Wound #3 Left Toe Great: Dry Gauze Conform/Kerlix Dressing Change Frequency: Wound #3 Left Toe Great: Change dressing twice daily. Other: - As needed Follow-up Appointments: Wound #3 Left Toe Great: Return Appointment in 1 week. Off-Loading: Wound #3 Left Toe Great: Open toe surgical shoe with peg assist. - left foot 1. Would recommend currently that we go ahead and continue with the wound care measures as before specifically with regard to the gentamicin cream which seems to be doing a great job keeping everything under control from an infection standpoint. 2. I am also can recommend he continue to cover this with a Band-Aid they can be changed twice a day. 3. I am also can recommend the patient continue to use the offloading shoe to prevent any rubbing or pressure getting to the tip of the toe. DAMEION, BRILES ([id number]) We will see patient back for reevaluation in 2 weeks here in the clinic. If anything worsens or changes patient will contact our office for additional recommendations. Electronic Signature(s) Signed: 05/28/2020 3:35:00 PM By: Worthy Keeler PA-C Entered By: Worthy Keeler on 05/28/2020 15:34:59 JAHMANI, STAUP ([id number]) -------------------------------------------------------------------------------- SuperBill Details Patient Name: Timothy Gibson Date of Service: 05/28/2020 Medical Record Number: [id number] Patient Account Number: [id number] Date of Birth/Sex: 11-20-1950 (69 y.o. M) Treating RN: Cornell Barman Primary Care Provider: Merrie Roof Other Clinician: Referring Provider: Merrie Roof Treating Provider/Extender: Skipper Cliche in Treatment: 45 Diagnosis Coding ICD-10  Codes Code Description [phone] Other chronic osteomyelitis, left ankle and foot E11.621 Type 2 diabetes mellitus with foot ulcer L97.522 Non-pressure chronic ulcer of other part of left foot with fat layer exposed I10 Essential (primary) hypertension Facility Procedures CPT4 Code: [id number] Description: 99213 - WOUND CARE VISIT-LEV 3 EST PT Modifier: Quantity: 1 Physician Procedures CPT4 Code: [id number] Description: 40086 - WC PHYS LEVEL 3 - EST PT Modifier: Quantity: 1 CPT4 Code: Description: ICD-10 Diagnosis Description M86.672 Other chronic osteomyelitis, left ankle and foot E11.621 Type 2 diabetes mellitus with foot ulcer I10 Essential (primary) hypertension L97.522 Non-pressure chronic ulcer of other part of left foot with  fat layer ex Modifier: posed Quantity: Electronic Signature(s) Signed: 05/28/2020 3:35:13 PM By: Worthy Keeler PA-C Entered By: Worthy Keeler on 05/28/2020 15:35:12

## 2020-05-30 DIAGNOSIS — Z23 Encounter for immunization: Secondary | ICD-10-CM | POA: Diagnosis not present

## 2020-05-30 DIAGNOSIS — D631 Anemia in chronic kidney disease: Secondary | ICD-10-CM | POA: Diagnosis not present

## 2020-05-30 DIAGNOSIS — N2581 Secondary hyperparathyroidism of renal origin: Secondary | ICD-10-CM | POA: Diagnosis not present

## 2020-05-30 DIAGNOSIS — Z992 Dependence on renal dialysis: Secondary | ICD-10-CM | POA: Diagnosis not present

## 2020-05-30 DIAGNOSIS — D509 Iron deficiency anemia, unspecified: Secondary | ICD-10-CM | POA: Diagnosis not present

## 2020-05-30 DIAGNOSIS — N186 End stage renal disease: Secondary | ICD-10-CM | POA: Diagnosis not present

## 2020-05-31 ENCOUNTER — Other Ambulatory Visit: Payer: Medicare Other

## 2020-06-01 DIAGNOSIS — D509 Iron deficiency anemia, unspecified: Secondary | ICD-10-CM | POA: Diagnosis not present

## 2020-06-01 DIAGNOSIS — N2581 Secondary hyperparathyroidism of renal origin: Secondary | ICD-10-CM | POA: Diagnosis not present

## 2020-06-01 DIAGNOSIS — D631 Anemia in chronic kidney disease: Secondary | ICD-10-CM | POA: Diagnosis not present

## 2020-06-01 DIAGNOSIS — Z992 Dependence on renal dialysis: Secondary | ICD-10-CM | POA: Diagnosis not present

## 2020-06-01 DIAGNOSIS — Z23 Encounter for immunization: Secondary | ICD-10-CM | POA: Diagnosis not present

## 2020-06-01 DIAGNOSIS — N186 End stage renal disease: Secondary | ICD-10-CM | POA: Diagnosis not present

## 2020-06-04 DIAGNOSIS — D509 Iron deficiency anemia, unspecified: Secondary | ICD-10-CM | POA: Diagnosis not present

## 2020-06-04 DIAGNOSIS — D631 Anemia in chronic kidney disease: Secondary | ICD-10-CM | POA: Diagnosis not present

## 2020-06-04 DIAGNOSIS — N186 End stage renal disease: Secondary | ICD-10-CM | POA: Diagnosis not present

## 2020-06-04 DIAGNOSIS — Z23 Encounter for immunization: Secondary | ICD-10-CM | POA: Diagnosis not present

## 2020-06-04 DIAGNOSIS — Z992 Dependence on renal dialysis: Secondary | ICD-10-CM | POA: Diagnosis not present

## 2020-06-04 DIAGNOSIS — N2581 Secondary hyperparathyroidism of renal origin: Secondary | ICD-10-CM | POA: Diagnosis not present

## 2020-06-07 DIAGNOSIS — N186 End stage renal disease: Secondary | ICD-10-CM | POA: Diagnosis not present

## 2020-06-07 DIAGNOSIS — N2581 Secondary hyperparathyroidism of renal origin: Secondary | ICD-10-CM | POA: Diagnosis not present

## 2020-06-07 DIAGNOSIS — E1140 Type 2 diabetes mellitus with diabetic neuropathy, unspecified: Secondary | ICD-10-CM | POA: Diagnosis not present

## 2020-06-07 DIAGNOSIS — D509 Iron deficiency anemia, unspecified: Secondary | ICD-10-CM | POA: Diagnosis not present

## 2020-06-07 DIAGNOSIS — D631 Anemia in chronic kidney disease: Secondary | ICD-10-CM | POA: Diagnosis not present

## 2020-06-07 DIAGNOSIS — L851 Acquired keratosis [keratoderma] palmaris et plantaris: Secondary | ICD-10-CM | POA: Diagnosis not present

## 2020-06-07 DIAGNOSIS — B351 Tinea unguium: Secondary | ICD-10-CM | POA: Diagnosis not present

## 2020-06-07 DIAGNOSIS — Z23 Encounter for immunization: Secondary | ICD-10-CM | POA: Diagnosis not present

## 2020-06-07 DIAGNOSIS — Z794 Long term (current) use of insulin: Secondary | ICD-10-CM | POA: Diagnosis not present

## 2020-06-07 DIAGNOSIS — Z992 Dependence on renal dialysis: Secondary | ICD-10-CM | POA: Diagnosis not present

## 2020-06-08 DIAGNOSIS — Z992 Dependence on renal dialysis: Secondary | ICD-10-CM | POA: Diagnosis not present

## 2020-06-08 DIAGNOSIS — N186 End stage renal disease: Secondary | ICD-10-CM | POA: Diagnosis not present

## 2020-06-09 DIAGNOSIS — N2581 Secondary hyperparathyroidism of renal origin: Secondary | ICD-10-CM | POA: Diagnosis not present

## 2020-06-09 DIAGNOSIS — Z992 Dependence on renal dialysis: Secondary | ICD-10-CM | POA: Diagnosis not present

## 2020-06-09 DIAGNOSIS — D509 Iron deficiency anemia, unspecified: Secondary | ICD-10-CM | POA: Diagnosis not present

## 2020-06-09 DIAGNOSIS — N186 End stage renal disease: Secondary | ICD-10-CM | POA: Diagnosis not present

## 2020-06-09 DIAGNOSIS — D631 Anemia in chronic kidney disease: Secondary | ICD-10-CM | POA: Diagnosis not present

## 2020-06-10 ENCOUNTER — Encounter: Payer: Medicare Other | Attending: Physician Assistant | Admitting: Physician Assistant

## 2020-06-10 ENCOUNTER — Other Ambulatory Visit: Payer: Self-pay

## 2020-06-10 DIAGNOSIS — E11621 Type 2 diabetes mellitus with foot ulcer: Secondary | ICD-10-CM | POA: Diagnosis not present

## 2020-06-10 DIAGNOSIS — M86672 Other chronic osteomyelitis, left ankle and foot: Secondary | ICD-10-CM | POA: Diagnosis not present

## 2020-06-10 DIAGNOSIS — Z794 Long term (current) use of insulin: Secondary | ICD-10-CM | POA: Insufficient documentation

## 2020-06-10 DIAGNOSIS — L97522 Non-pressure chronic ulcer of other part of left foot with fat layer exposed: Secondary | ICD-10-CM | POA: Diagnosis not present

## 2020-06-11 DIAGNOSIS — N2581 Secondary hyperparathyroidism of renal origin: Secondary | ICD-10-CM | POA: Diagnosis not present

## 2020-06-11 DIAGNOSIS — D509 Iron deficiency anemia, unspecified: Secondary | ICD-10-CM | POA: Diagnosis not present

## 2020-06-11 DIAGNOSIS — D631 Anemia in chronic kidney disease: Secondary | ICD-10-CM | POA: Diagnosis not present

## 2020-06-11 DIAGNOSIS — N186 End stage renal disease: Secondary | ICD-10-CM | POA: Diagnosis not present

## 2020-06-11 DIAGNOSIS — Z992 Dependence on renal dialysis: Secondary | ICD-10-CM | POA: Diagnosis not present

## 2020-06-11 NOTE — Progress Notes (Signed)
CLEVE, PAOLILLO ([id number]) Visit Report for 06/10/2020 Chief Complaint Document Details Patient Name: Timothy Gibson, Timothy Gibson Date of Service: 06/10/2020 12:30 PM Medical Record Number: [id number] Patient Account Number: [id number] Date of Birth/Sex: [birth year] (69 y.o. M) Treating RN: Cornell Barman Primary Care Provider: Merrie Roof Other Clinician: Referring Provider: Merrie Roof Treating Provider/Extender: Skipper Cliche in Treatment: 87 Information Obtained from: Patient Chief Complaint Left Great Toe Electronic Signature(s) Signed: 06/10/2020 12:58:49 PM By: Worthy Keeler PA-C Entered By: Worthy Keeler on 06/10/2020 12:58:49 Timothy Gibson, Timothy Gibson ([id number]) -------------------------------------------------------------------------------- HPI Details Patient Name: Timothy Gibson Date of Service: 06/10/2020 12:30 PM Medical Record Number: [id number] Patient Account Number: [id number] Date of Birth/Sex: [birth year] (69 y.o. M) Treating RN: Cornell Barman Primary Care Provider: Merrie Roof Other Clinician: Referring Provider: Merrie Roof Treating Provider/Extender: Skipper Cliche in Treatment: 35 History of Present Illness HPI Description: 11/27/17 on evaluation today patient presents for initial evaluation concerning an issue that he has been having with his right great toe which began last Thursday 11/22/17. He has a history of diabetes mellitus type to which he has had for greater than 30 years currently he is on insulin. Subsequently he also has a history of hypertension. On physical exam inspection is also appears he may have some peripheral vascular disease with his ABI's being noncompressible bilaterally. He is on dialysis as well and this is on Monday, Wednesday, and Friday. Patient was hospitalized back in January/February 2019 although this was more for stomach/back pain though they never told me exactly what was going on. This is according to the patient. Subsequently the  ulcer which is between the third and fourth toe when space of the right foot as well is on the plantar aspect of the fourth toe is due to him having cleaned his toes this morning and he states that his finger which is large split the toe tissue in between causing the wounds that we currently see. With that being said he states this has happened before I explained I would definitely recommend that he not do this any longer. He can use a small soft washcloth to clean between the toes without causing trauma. Subsequently the right great toe hatchery does show evidence of necrotic tissue present on the surface of the wound specifically there is callous and dead skin surrounding which is trapping fluid causing problems as far as the wound is concerned. The surface of the wound does show slough although due to his vascular flow I'm not going to sharply debride this today I think I will selectively debride away the necrotic skin as well is callous from around the surrounding so this will not continue to be a moisture issue. Nonetheless the patient has no pain he does have diabetic neuropathy. 12/06/17-He is here in follow-up evaluation for a right great toe ulcer. He is voicing no complaints or concerns. He continues to infrequently/socially smokes cigars with no desire to quit. He has been compliant with offloading, using open toed surgical shoe; has been compliant using Santyl daily. He continues on clindamycin although takes it inconsistently secondary to indigestion. The plain film x-ray performed on 5/23 impression: Soft tissue swelling of the left first toe and is noted with probable irregular lucency involving distal tuft of first distal phalanx concerning for osteomyelitis. MRI may be performed for further evaluation; MRI ordered. The vascular evaluation performed on 5/24 field non- compressible ABIs bilaterally and reduced TBI bilaterally suggesting significant tibial disease. He will be referred to  vascular medicine  for further evaluation. 12/13/17-He is here in follow-up evaluation for right great toe ulcer. He has appointment next Thursday for the MRI and vascular evaluation. Wound culture obtained last week grew abundant Klebsiella oxytoca (multidrug sensitivity), and abundant enteric coccus faecalis (sensitive to ampicillin). Ampicillin and Cipro were called in, along with a probiotic. In light of his appointments next Thursday he will follow-up in 2 weeks, we will continue with Santyl 12/27/17-He is here for evaluation for a right great toe ulcer. 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 He did have evaluation by vascular medicine, Dr. Ronalee Belts, on 6/13, at that appointment it was decided for him to undergo angiography with possible intervention but he refused and is still considering. If he chooses to have it done he will contact the office. He has not heard back from either ID offices that he was referred to two weeks ago: [place] and [place]. there is improvement in both appearance and measurement to the ulcer. We will extend antibiotic therapy (amoxicillin 500 every 12, Cipro 500 daily) for additional 2 weeks pending infectious disease consult, he will continue with Santyl daily. He was reminded to continue with probiotic therapy while on oral antibiotics; he denies any GI disturbance. 01/03/18-He is here in follow-up evaluation for right great toe ulcer. He is decided to not undergo any vascular intervention at this time. He was established with [facility] infectious disease (Dr. Linus Salmons) last week initiated on vancomycin and cefazolin with dialysis treatment for 6 weeks. We will continue with current treatment plan with Santyl and offloading and he will follow-up in 2 weeks 01/17/18-He is seen in follow-up evaluation for right great toe ulcer. There is improvement in appearance with less nonviable tissue  present. We will continue with same treatment plan he will follow-up next week. He is tolerating IV antibiotics without any complications 09/05/31-HL is seen in follow up evaluation for right great toe ulcer. There continues to be improvement. He is tolerating IV antibiotics with hemodialysis, completion date of 7/31. We will transition to collagen and and follow-up next week 02/07/18-He is here in follow up for a right great toe ulcer. There is red granulation tissue throughout the wound, improved in appearance. He completed antibiotics yesterday. He saw podiatry last Thursday for nail trimming, at that appointment he periwound callus was trimmed and a culture was obtained; culture negative. He does not have a follow up appointment with infectious disease. We will submit for grafix. 02/14/18-He is here in follow-up evaluation for a right great toe ulcer. There is slow improvement, red granulation tissue throughout. The insurance approval for grafix is pending. We will continue with collagen and he'll follow-up next week 02/21/18-He is seen in follow-up evaluation for right great toe ulcer. There is improvement with red granulation tissue throughout. He was approved for oasis and this was placed today. He will follow up next week 02/28/18-He is seen in follow-up evaluation for right great toe ulcer. The wound is stable, oasis applied and he'll follow-up next week 03/07/18-He is seen in follow-up violation of her right great toe ulcer. The wound is dry, no evidence of drainage. The wound appears healed today. He was painted with Betadine, instructed to paint with Betadine daily, cover with band-aid for the next week and then cover with band-aid for the following week continue with surgical shoe. He was encouraged to contact the clinic with any evidence of drainage, or change in appearance to toe/wound. He will  be discharged from wound care services Readmission: 07/14/18 on evaluation today patient presents for  initial evaluation our clinic concerning issues that he is having with his left great toe. We previously saw him in 2019 for his right great toe which was actually worse at that time and subsequently was able to be completely healed in the end although it did take several months. With that being said the patient is currently having an area on his left great toe that occurred initially as result of a blister that came up he's not really sure why or how. Subsequently this was initially treated by his podiatrist though the recommendation there was apparently amputation according to the patient and his daughter who was present with him today. With that being said the patient did not want to proceed with amputation and would like to try to perform wound care and get this to heal without having to go down that road. We were able to do that with his other toe and so he would like to at least give that a try before going forward with amputation. Again I completely understand his concern in this regard. With that being said I did discuss with the patient obviously that it's always a chance that we cannot get this to heal with normal wound care measures but obviously we will give it our best shot. He does actually have an appointment scheduled for later today with the vascular specialist in order to evaluate his blood flow. That was at the recommendation of his podiatrist as well. Obviously if he is having good arterial flow and nonetheless is still experiencing issues with having the wound delay in healing then it may simply be a matter of we need to initiate more aggressive wound to therapy in order to see if we can get this to heal. No fevers, chills, TRIGO, WINTERBOTTOM. ([id number]) nausea, or vomiting noted at this time. The patient notes that this woman has been present for approximately three weeks. He is not a smoker. His most recent hemoglobin A1c was 6.2 on 06/18/19. 07/17/2019 on evaluation today patient  appears unfortunately to not be doing as well. He did see vascular I did review the note as well. That was on 07/15/2019. Subsequently there can actually be taking him to the OR for an angiogram due to poor arterial flow into this extremity. Apparently the atherosclerotic changes were severe. The patient is stated to be at risk for limb loss. Nonetheless the good news is this is being scheduled fairly quickly he will have the surgery/procedure next Thursday. With that being said I am going also go ahead based on the results of his x-ray which showed some chance of osteomyelitis in the distal portion of the toe get the patient set up for an MRI to further evaluate for the possibility of osteomyelitis obviously if he does have osteomyelitis we need to know so that we can address this appropriately. 07/29/2019 upon evaluation today patient appears to be doing better in general with regard to his toe ulcer. He does have much better blood flow you can actually feel a palpable pulse which appears to be very strong at this time. I did review his arterial intervention/angiogram which did show that he had evidence of sufficient arterial flow restoration at this point. He did have occlusions that were 60 and 80% that residually were only 10 and 25% with no limiting flow. This is excellent news and hopefully he will continue to show signs of improvement in  light of the improved vascular status. With that being said the toe mainly shows somewhat of an eschar today I think that this is fairly stable and as such probably would be best not to really do much in the way of debridement at this point I do believe Betadine would likely be a good option. 08/07/2019 on evaluation today patient actually appears to be doing about the same. The one difference is the eschar that we were just seeing if we can maintain is actually starting to loosen up and leak around the edges I think it is time to actually go ahead and remove this  today. Is also no longer stable is very soft. Nonetheless the patient is okay with proceeding with debridement at this point. Fortunately there is no signs of active infection. No fevers, chills, nausea, vomiting, or diarrhea. The patient states that he took his last antibiotic that he had last night. He would need a refill as he will not be seeing infectious disease until February 2. Nonetheless obviously I do not want him to have any break in therapy especially when he is doing as well as he is currently. 08/14/2019 on evaluation today patient appears to be doing quite well all things considered in regard to his toe. I do feel like he is making progress and though this is not completely healed by any means we still have some ways to go I feel like he is making progress which is excellent. There is no sign of active infection at this time systemically. He does have osteomyelitis of the distal phalanx of his great toe on the left. He did see Dr. Linus Salmons. That is the infectious disease doctor in [place] where he was referred. Dr. Linus Salmons apparently according to the note did not feel like the patient's MRI revealed osteomyelitis but just reactive marrow and subsequently did not recommend any antibiotic therapy whatsoever. To be peripherally honest I am not really in agreement with this based on the MRI results and what we are seeing and I think he has been responding at least decently well to oral medication as well. Nonetheless we may want to check into the possibility of IV antibiotics being administered at the dialysis center. I will discuss this with Dr. Dellia Nims further. 08/21/2019 upon evaluation today patient appears at this point to be making some progress with regard to his wounds. He has been tolerating the dressing changes without complication. Fortunately there is no evidence of active infection at this time. Overall I feel like he is doing well with the oral antibiotics I discussed with Dr.  Dellia Nims the possibility of doing IV antibiotic therapy versus continuing with the oral. His opinion was if the patient is doing well with oral to maybe stick with that. Obviously we can initiate additional Cipro as well just to help ensure that there is nothing worsening here. The Cipro should be beneficial for him as well. That helps cover more gram-negative's were is at the doxycycline is more gram- positive's. 09/02/2019 on evaluation today patient appears to be doing fairly well upon inspection today. With that being said though the patient is continuing to do well he does also continue to develop issues with buildup of necrotic tissue on the distal portion of his toe he does have good arterial flow however. For that reason I am going to suggest that what we may need to do is consider using something such as Santyl to try to keep this free and clear. Also discussed in greater  detail HBO therapy with him today please see plan for additional recommendations and results of this discussion. 09/11/2019 upon evaluation today patient appears to be doing well with regard to his toe ulcer. This actually appears to be much better today using the Santyl than it was previous. Fortunately there is no signs of active infection. No fevers, chills, nausea, vomiting, or diarrhea. 09/18/2019 upon evaluation today patient appears to be doing much better in regard to his wound in general. The Santyl was doing a good job at loosening stuff up here for [place] which is excellent news. With that being said he is still taking the oral antibiotic. I sent this in last for him on 09/03/2019. This was the doxycycline. When this runs out he will actually be complete as far as the treatment is concerned. With that being said I am getting continue the Cipro for 1 additional month. Overall his wound seems to be doing quite well. 09/25/2019 upon evaluation today patient appears to be doing well with regard to his toe ulcer. This is measuring  much better and overall seems to be making good progress. Fortunately there is no signs of active infection at this time. No fevers, chills, nausea, vomiting, or diarrhea. 10/02/2019 upon evaluation today patient appears to be doing well with regard to his wound. He has been tolerating the dressing changes without complication. Fortunately there is no signs of active infection at this time. No fevers, chills, nausea, vomiting, or diarrhea. Overall I am extremely pleased with how things seem to be progressing. I think the wound bed is at the point now where we can likely proceed with additional measures to try to get this healed more quickly I think Dermagraft may be a good possibility for the patient. We can definitely look into seeing about approval for this. 10/09/2019 upon evaluation today patient's wound actually appears to be doing well although it still is progressing somewhat slowly compared to what I would like to see. With that being said I did order Apligraf for the patient which I felt would keep the wound nice and moist without allowing it to dry out and hopefully allow this to heal much more effectively and quickly. The patient actually did obtain approval and therefore we can apply that today. 10/23/2019 upon evaluation today patient's wound actually appears to be doing significantly better with regard to his toe ulcer. The tissue at the base of the wound is greatly improved and overall I am very pleased with the progress made. We did use Apligraf 2 weeks ago on the toe that did excellent for him much it has completely dissolved after as of the 2 weeks. They only had to change the dressing a few times due to drainage through. Obviously they left the Steri-Strips and Mepitel in place. Unfortunately we do not have the Apligraf available today for application regard therefore have to use a different product here in the clinic today and then order the Apligraf for next Thursday. That will be his  second application. 4/22; patient arrived with overhanging skin and subcutaneous tissue on the wound margins this was removed. Apligraf #2 applied 11/13/2019 upon evaluation today patient actually appears to be doing well with regard to his foot ulcer. He has been tolerating the dressing changes without complication. The overall wound bed appears to be healthier and has filled in as well as not nearly as deep as it was the tissue is also better quality. Overall I am extremely pleased with how things seem to  be progressing. The patient is likewise very happy in this regard. 5/20; wound is measuring smaller had Apligraf #3 last time. Apligraf #4 today Timothy Gibson, Timothy Gibson ([id number]) 12/12/2019 upon evaluation today patient actually appears to be doing quite well with regard to his wound in my opinion. The ulcer is significantly smaller compared to last time I saw him although that has been several weeks. I do believe the Apligraf has been beneficial he has 1 remaining today this will be application #5. 1/95/0932 upon evaluation today patient appears to be doing well with regard to his toe ulcer. He is measuring about the same today but he has come a long way from where this started. Fortunately there is no evidence of active infection which is great news and overall I am very pleased with the progress that is been made. Nonetheless at this point we have completed the Apligraf course he did see improvement through this but I am going to switch to something different as of today. 01/08/2020 upon evaluation today patient appears to be doing much better in regards to his toe ulcer this is measuring smaller and overall very pleased with the progress. The Hydrofera Blue seems to be doing a great job. Fortunately there is no evidence of active infection at this time. No fevers, chills, nausea, vomiting, or diarrhea. 01/29/2020 upon evaluation today patient appears to be doing okay in regard to his toe ulcer. The  main issue I see is that he actually has trouble right now with callus growing and covering over the wound bed. This prevents him from being able to get a dressing in contact with the wound bed and subsequently fluid collection underneath. I think that we probably need to see him more frequently to keep this pared down and debrided to the point to allow the dressings to effectively heal this wound. I discussed that with the patient today. 02/05/2020 on evaluation today patient appears to be doing well with regard to his wounds currently. In fact he just has 1 on his toe remaining at this point he seems to be doing quite well in my opinion. I am very pleased with the overall appearance today. There is no signs of active infection I do think keeping the callus trimmed down has been of great benefit as far as healing is concerned. I do not think we need to go 3 weeks out like we were in the past I believe that is been somewhat more detrimental than good for him. 02/19/2020 upon evaluation today patient appears to be doing better in regard to his toe ulcer still there is callus buildup around the top of the toe which unfortunately is I think causing some issues here with trapping a little fluid underneath them after cleaning this away. Fortunately there is no evidence of active infection at this time. Overall I feel like the patient is doing quite well which is great news. 03/04/2020 on evaluation today patient appears to be doing well with regard to his toe ulcer though he still has a lot of drying out of the endoform and a lot of callus buildup as well unfortunately. With that being said I think that he is can require some sharp debridement to clear away some of the callus today. We will see what the wound looks like following. 03/11/2020 on evaluation today patient appears to be doing excellent in regard to his toe ulcer. He did have a lot of callus and even some of the nail, growing over top of  the area  where the wound is trying to heal. For that reason I did actually trim back some of the nail also did go ahead and trim away some of the callus as well. He tolerated all this without complication the wound appears to be doing well and is showing no signs of infection at this point. Overall I am very pleased. 03/18/2020 upon evaluation today patient appears to be doing well with regard to his toe ulcer. There does not appear to be any significant buildup of callus at this point and overall I do believe that the alginate has been beneficial. Trimming back the toenail also seems to have helped a lot in this regard I think that was causing some rubbing on the end of the toe. Fortunately overall he is doing much better unfortunately he notes that his grandson has been placed on hospice he is 47 and apparently not doing too well. He wants to make a trip to go see him I really think that something he probably should do and not wait for this to try to heal. 04/01/2020 upon evaluation today patient's toe ulcer appears to be doing about the same. He does have some callus buildup slowly he is making progress but were not completely closed as of yet. I am going to need to perform some debridement today to clear this way. 04/15/2020 on evaluation today patient appears to be doing well in general in regard to his wounds there is no signs of active infection in regard to the toe obviously although he does have a lot of drainage which does raise a bit of a question in that regard. I did discuss with him possibilities for we may want to do treatment wise to try to help out with this. With that being said I Georgina Peer probably start with a topical antibiotic and continuing with the current dressing for the time being. 04/29/2020 on evaluation today patient appears to be doing decently well in regard to his toe ulcer. There is no signs of active infection at this time. Fortunately I believe that the patient has made some  progress since last time I saw him. Still this is taken much longer than I would like to see obviously the patient is ready for this to be done. 05/13/2020 upon evaluation today patient appears to be showing some signs of improvement with regard to his toe ulcer this is great news and overall very pleased in that regard. There is no evidence of active infection currently which is great news as well. No fevers, chills, nausea, vomiting, or diarrhea. 05/20/2020 on evaluation today patient appears to be doing actually very well in regard to his toe ulcer. He did have some dry skin buildup on the surface of the wound along with some drainage and dressing material. I remove this and the wound underneath appears to be doing significantly better. There is a lot of new skin growth and I am very pleased. With that being said I think that we may want to just use a little bit of antibiotic cream over this for the next week and see if we can get this to close. 05/28/2020 upon evaluation today patient appears to be doing well with regard to his toe ulcer. Fortunately there is no signs of active infection at this time. No fevers, chills, nausea, vomiting, or diarrhea. I do believe this is very close to closure but not quite today. 06/10/2020 upon evaluation today patient actually appears to be completely healed and is  doing excellent based on what I am seeing today. There does not appear to be any signs of active infection which is great news. Overall I am pleased that that he has finally healed in regard to this wound that has been giving him trouble for much too long. Electronic Signature(s) Signed: 06/10/2020 4:38:56 PM By: Worthy Keeler PA-C Entered By: Worthy Keeler on 06/10/2020 16:38:55 Timothy Gibson, Timothy Gibson ([id number]) -------------------------------------------------------------------------------- Physical Exam Details Patient Name: Timothy Gibson Date of Service: 06/10/2020 12:30 PM Medical Record  Number: [id number] Patient Account Number: [id number] Date of Birth/Sex: 06-Dec-1950 (69 y.o. M) Treating RN: Cornell Barman Primary Care Provider: Merrie Roof Other Clinician: Referring Provider: Merrie Roof Treating Provider/Extender: Skipper Cliche in Treatment: 2 Constitutional Well-nourished and well-hydrated in no acute distress. Respiratory normal breathing without difficulty. Psychiatric this patient is able to make decisions and demonstrates good insight into disease process. Alert and Oriented x 3. pleasant and cooperative. Notes His wound bed actually showed signs of good Epithelization at this time. There does not appear to be any evidence of active infection which is great news and overall I am extremely pleased with where things stand today. Electronic Signature(s) Signed: 06/10/2020 4:40:07 PM By: Worthy Keeler PA-C Previous Signature: 06/10/2020 4:39:18 PM Version By: Worthy Keeler PA-C Entered By: Worthy Keeler on 06/10/2020 16:40:07 Timothy Gibson ([id number]) -------------------------------------------------------------------------------- Physician Orders Details Patient Name: Timothy Gibson Date of Service: 06/10/2020 12:30 PM Medical Record Number: [id number] Patient Account Number: [id number] Date of Birth/Sex: Dec 15, 1950 (69 y.o. M) Treating RN: Cornell Barman Primary Care Provider: Merrie Roof Other Clinician: Referring Provider: Merrie Roof Treating Provider/Extender: Skipper Cliche in Treatment: 19 Verbal / Phone Orders: No Diagnosis Coding Discharge From [facility] o Discharge from [place] - treatment complete Electronic Signature(s) Signed: 06/10/2020 4:56:30 PM By: Worthy Keeler PA-C Signed: 06/11/2020 4:55:43 PM By: Gretta Cool, BSN, RN, CWS, Kim RN, BSN Entered By: Gretta Cool, BSN, RN, CWS, Kim on 06/10/2020 12:55:38 Timothy Gibson ([id number]) -------------------------------------------------------------------------------- Problem  List Details Patient Name: Timothy Gibson Date of Service: 06/10/2020 12:30 PM Medical Record Number: [id number] Patient Account Number: [id number] Date of Birth/Sex: 05/10/1951 (69 y.o. M) Treating RN: Cornell Barman Primary Care Provider: Merrie Roof Other Clinician: Referring Provider: Merrie Roof Treating Provider/Extender: Skipper Cliche in Treatment: 58 Active Problems ICD-10 Encounter Code Description Active Date MDM Diagnosis [phone] Other chronic osteomyelitis, left ankle and foot 07/29/2019 No Yes E11.621 Type 2 diabetes mellitus with foot ulcer 07/15/2019 No Yes L97.522 Non-pressure chronic ulcer of other part of left foot with fat layer 07/15/2019 No Yes exposed [place] (primary) hypertension 07/15/2019 No Yes Inactive Problems Resolved Problems Electronic Signature(s) Signed: 06/10/2020 12:58:34 PM By: Worthy Keeler PA-C Entered By: Worthy Keeler on 06/10/2020 12:58:34 Timothy Gibson ([id number]) -------------------------------------------------------------------------------- Progress Note Details Patient Name: Timothy Gibson Date of Service: 06/10/2020 12:30 PM Medical Record Number: [id number] Patient Account Number: [id number] Date of Birth/Sex: Feb 05, 1951 (69 y.o. M) Treating RN: Cornell Barman Primary Care Provider: Merrie Roof Other Clinician: Referring Provider: Merrie Roof Treating Provider/Extender: Skipper Cliche in Treatment: 88 Subjective Chief Complaint Information obtained from Patient Left Great Toe History of Present Illness (HPI) 11/27/17 on evaluation today patient presents for initial evaluation concerning an issue that he has been having with his right great toe which began last Thursday 11/22/17. He has a history of diabetes mellitus type to which he has had for greater than 30 years currently he is on insulin.  Subsequently he also has a history of hypertension. On physical exam inspection is also appears he may have some peripheral  vascular disease with his ABI's being noncompressible bilaterally. He is on dialysis as well and this is on Monday, Wednesday, and Friday. Patient was hospitalized back in January/February 2019 although this was more for stomach/back pain though they never told me exactly what was going on. This is according to the patient. Subsequently the ulcer which is between the third and fourth toe when space of the right foot as well is on the plantar aspect of the fourth toe is due to him having cleaned his toes this morning and he states that his finger which is large split the toe tissue in between causing the wounds that we currently see. With that being said he states this has happened before I explained I would definitely recommend that he not do this any longer. He can use a small soft washcloth to clean between the toes without causing trauma. Subsequently the right great toe hatchery does show evidence of necrotic tissue present on the surface of the wound specifically there is callous and dead skin surrounding which is trapping fluid causing problems as far as the wound is concerned. The surface of the wound does show slough although due to his vascular flow I'm not going to sharply debride this today I think I will selectively debride away the necrotic skin as well is callous from around the surrounding so this will not continue to be a moisture issue. Nonetheless the patient has no pain he does have diabetic neuropathy. 12/06/17-He is here in follow-up evaluation for a right great toe ulcer. He is voicing no complaints or concerns. He continues to infrequently/socially smokes cigars with no desire to quit. He has been compliant with offloading, using open toed surgical shoe; has been compliant using Santyl daily. He continues on clindamycin although takes it inconsistently secondary to indigestion. The plain film x-ray performed on 5/23 impression: Soft tissue swelling of the left first toe and is  noted with probable irregular lucency involving distal tuft of first distal phalanx concerning for osteomyelitis. MRI may be performed for further evaluation; MRI ordered. The vascular evaluation performed on 5/24 field non- compressible ABIs bilaterally and reduced TBI bilaterally suggesting significant tibial disease. He will be referred to vascular medicine for further evaluation. 12/13/17-He is here in follow-up evaluation for right great toe ulcer. He has appointment next Thursday for the MRI and vascular evaluation. Wound culture obtained last week grew abundant Klebsiella oxytoca (multidrug sensitivity), and abundant enteric coccus faecalis (sensitive to ampicillin). Ampicillin and Cipro were called in, along with a probiotic. In light of his appointments next Thursday he will follow-up in 2 weeks, we will continue with Santyl 12/27/17-He is here for evaluation for a right great toe ulcer. 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 He did have evaluation by vascular medicine, Dr. Ronalee Belts, on 6/13, at that appointment it was decided for him to undergo angiography with possible intervention but he refused and is still considering. If he chooses to have it done he will contact the office. He has not heard back from either ID offices that he was referred to two weeks ago: [place] and [place]. there is improvement in both appearance and measurement to the ulcer. We will extend antibiotic therapy (amoxicillin 500 every 12, Cipro 500 daily) for additional 2 weeks pending infectious disease consult, he will continue  with Santyl daily. He was reminded to continue with probiotic therapy while on oral antibiotics; he denies any GI disturbance. 01/03/18-He is here in follow-up evaluation for right great toe ulcer. He is decided to not undergo any vascular intervention at this time. He was established with [facility] infectious  disease (Dr. Linus Salmons) last week initiated on vancomycin and cefazolin with dialysis treatment for 6 weeks. We will continue with current treatment plan with Santyl and offloading and he will follow-up in 2 weeks 01/17/18-He is seen in follow-up evaluation for right great toe ulcer. There is improvement in appearance with less nonviable tissue present. We will continue with same treatment plan he will follow-up next week. He is tolerating IV antibiotics without any complications 1/60/73-XT is seen in follow up evaluation for right great toe ulcer. There continues to be improvement. He is tolerating IV antibiotics with hemodialysis, completion date of 7/31. We will transition to collagen and and follow-up next week 02/07/18-He is here in follow up for a right great toe ulcer. There is red granulation tissue throughout the wound, improved in appearance. He completed antibiotics yesterday. He saw podiatry last Thursday for nail trimming, at that appointment he periwound callus was trimmed and a culture was obtained; culture negative. He does not have a follow up appointment with infectious disease. We will submit for grafix. 02/14/18-He is here in follow-up evaluation for a right great toe ulcer. There is slow improvement, red granulation tissue throughout. The insurance approval for grafix is pending. We will continue with collagen and he'll follow-up next week 02/21/18-He is seen in follow-up evaluation for right great toe ulcer. There is improvement with red granulation tissue throughout. He was approved for oasis and this was placed today. He will follow up next week 02/28/18-He is seen in follow-up evaluation for right great toe ulcer. The wound is stable, oasis applied and he'll follow-up next week 03/07/18-He is seen in follow-up violation of her right great toe ulcer. The wound is dry, no evidence of drainage. The wound appears healed today. He was painted with Betadine, instructed to paint with Betadine  daily, cover with band-aid for the next week and then cover with band-aid for the following week continue with surgical shoe. He was encouraged to contact the clinic with any evidence of drainage, or change in appearance to toe/wound. He will be discharged from wound care services Readmission: 07/14/18 on evaluation today patient presents for initial evaluation our clinic concerning issues that he is having with his left great toe. We previously saw him in 2019 for his right great toe which was actually worse at that time and subsequently was able to be completely healed in the end although it did take several months. With that being said the patient is currently having an area on his left great toe that occurred initially as result of a blister that came up he's not really sure why or how. Subsequently this was initially treated by his podiatrist though the recommendation there was apparently amputation according to the patient and his daughter who was present with him today. With that being said the patient did not want to proceed with amputation and would like to try to perform wound care and get this to heal without having to go down that road. We were able to do that with his other toe and so he would like to at least give that a try before going forward with amputation. Timothy Gibson, Timothy Gibson ([id number]) Again I completely understand his concern in this regard.  With that being said I did discuss with the patient obviously that it's always a chance that we cannot get this to heal with normal wound care measures but obviously we will give it our best shot. He does actually have an appointment scheduled for later today with the vascular specialist in order to evaluate his blood flow. That was at the recommendation of his podiatrist as well. Obviously if he is having good arterial flow and nonetheless is still experiencing issues with having the wound delay in healing then it may simply be a matter of we  need to initiate more aggressive wound to therapy in order to see if we can get this to heal. No fevers, chills, nausea, or vomiting noted at this time. The patient notes that this woman has been present for approximately three weeks. He is not a smoker. His most recent hemoglobin A1c was 6.2 on 06/18/19. 07/17/2019 on evaluation today patient appears unfortunately to not be doing as well. He did see vascular I did review the note as well. That was on 07/15/2019. Subsequently there can actually be taking him to the OR for an angiogram due to poor arterial flow into this extremity. Apparently the atherosclerotic changes were severe. The patient is stated to be at risk for limb loss. Nonetheless the good news is this is being scheduled fairly quickly he will have the surgery/procedure next Thursday. With that being said I am going also go ahead based on the results of his x-ray which showed some chance of osteomyelitis in the distal portion of the toe get the patient set up for an MRI to further evaluate for the possibility of osteomyelitis obviously if he does have osteomyelitis we need to know so that we can address this appropriately. 07/29/2019 upon evaluation today patient appears to be doing better in general with regard to his toe ulcer. He does have much better blood flow you can actually feel a palpable pulse which appears to be very strong at this time. I did review his arterial intervention/angiogram which did show that he had evidence of sufficient arterial flow restoration at this point. He did have occlusions that were 60 and 80% that residually were only 10 and 25% with no limiting flow. This is excellent news and hopefully he will continue to show signs of improvement in light of the improved vascular status. With that being said the toe mainly shows somewhat of an eschar today I think that this is fairly stable and as such probably would be best not to really do much in the way of debridement  at this point I do believe Betadine would likely be a good option. 08/07/2019 on evaluation today patient actually appears to be doing about the same. The one difference is the eschar that we were just seeing if we can maintain is actually starting to loosen up and leak around the edges I think it is time to actually go ahead and remove this today. Is also no longer stable is very soft. Nonetheless the patient is okay with proceeding with debridement at this point. Fortunately there is no signs of active infection. No fevers, chills, nausea, vomiting, or diarrhea. The patient states that he took his last antibiotic that he had last night. He would need a refill as he will not be seeing infectious disease until February 2. Nonetheless obviously I do not want him to have any break in therapy especially when he is doing as well as he is currently. 08/14/2019 on evaluation  today patient appears to be doing quite well all things considered in regard to his toe. I do feel like he is making progress and though this is not completely healed by any means we still have some ways to go I feel like he is making progress which is excellent. There is no sign of active infection at this time systemically. He does have osteomyelitis of the distal phalanx of his great toe on the left. He did see Dr. Linus Salmons. That is the infectious disease doctor in [place] where he was referred. Dr. Linus Salmons apparently according to the note did not feel like the patient's MRI revealed osteomyelitis but just reactive marrow and subsequently did not recommend any antibiotic therapy whatsoever. To be peripherally honest I am not really in agreement with this based on the MRI results and what we are seeing and I think he has been responding at least decently well to oral medication as well. Nonetheless we may want to check into the possibility of IV antibiotics being administered at the dialysis center. I will discuss this with Dr. Dellia Nims  further. 08/21/2019 upon evaluation today patient appears at this point to be making some progress with regard to his wounds. He has been tolerating the dressing changes without complication. Fortunately there is no evidence of active infection at this time. Overall I feel like he is doing well with the oral antibiotics I discussed with Dr. Dellia Nims the possibility of doing IV antibiotic therapy versus continuing with the oral. His opinion was if the patient is doing well with oral to maybe stick with that. Obviously we can initiate additional Cipro as well just to help ensure that there is nothing worsening here. The Cipro should be beneficial for him as well. That helps cover more gram-negative's were is at the doxycycline is more gram- positive's. 09/02/2019 on evaluation today patient appears to be doing fairly well upon inspection today. With that being said though the patient is continuing to do well he does also continue to develop issues with buildup of necrotic tissue on the distal portion of his toe he does have good arterial flow however. For that reason I am going to suggest that what we may need to do is consider using something such as Santyl to try to keep this free and clear. Also discussed in greater detail HBO therapy with him today please see plan for additional recommendations and results of this discussion. 09/11/2019 upon evaluation today patient appears to be doing well with regard to his toe ulcer. This actually appears to be much better today using the Santyl than it was previous. Fortunately there is no signs of active infection. No fevers, chills, nausea, vomiting, or diarrhea. 09/18/2019 upon evaluation today patient appears to be doing much better in regard to his wound in general. The Santyl was doing a good job at loosening stuff up here for [place] which is excellent news. With that being said he is still taking the oral antibiotic. I sent this in last for him on 09/03/2019. This  was the doxycycline. When this runs out he will actually be complete as far as the treatment is concerned. With that being said I am getting continue the Cipro for 1 additional month. Overall his wound seems to be doing quite well. 09/25/2019 upon evaluation today patient appears to be doing well with regard to his toe ulcer. This is measuring much better and overall seems to be making good progress. Fortunately there is no signs of active infection at  this time. No fevers, chills, nausea, vomiting, or diarrhea. 10/02/2019 upon evaluation today patient appears to be doing well with regard to his wound. He has been tolerating the dressing changes without complication. Fortunately there is no signs of active infection at this time. No fevers, chills, nausea, vomiting, or diarrhea. Overall I am extremely pleased with how things seem to be progressing. I think the wound bed is at the point now where we can likely proceed with additional measures to try to get this healed more quickly I think Dermagraft may be a good possibility for the patient. We can definitely look into seeing about approval for this. 10/09/2019 upon evaluation today patient's wound actually appears to be doing well although it still is progressing somewhat slowly compared to what I would like to see. With that being said I did order Apligraf for the patient which I felt would keep the wound nice and moist without allowing it to dry out and hopefully allow this to heal much more effectively and quickly. The patient actually did obtain approval and therefore we can apply that today. 10/23/2019 upon evaluation today patient's wound actually appears to be doing significantly better with regard to his toe ulcer. The tissue at the base of the wound is greatly improved and overall I am very pleased with the progress made. We did use Apligraf 2 weeks ago on the toe that did excellent for him much it has completely dissolved after as of the 2 weeks.  They only had to change the dressing a few times due to drainage through. Obviously they left the Steri-Strips and Mepitel in place. Unfortunately we do not have the Apligraf available today for application regard therefore have to use a different product here in the clinic today and then order the Apligraf for next Thursday. That will be his second application. 4/22; patient arrived with overhanging skin and subcutaneous tissue on the wound margins this was removed. Apligraf #2 applied Timothy Gibson, Timothy Gibson ([id number]) 11/13/2019 upon evaluation today patient actually appears to be doing well with regard to his foot ulcer. He has been tolerating the dressing changes without complication. The overall wound bed appears to be healthier and has filled in as well as not nearly as deep as it was the tissue is also better quality. Overall I am extremely pleased with how things seem to be progressing. The patient is likewise very happy in this regard. 5/20; wound is measuring smaller had Apligraf #3 last time. Apligraf #4 today 12/12/2019 upon evaluation today patient actually appears to be doing quite well with regard to his wound in my opinion. The ulcer is significantly smaller compared to last time I saw him although that has been several weeks. I do believe the Apligraf has been beneficial he has 1 remaining today this will be application #5. 5/91/6384 upon evaluation today patient appears to be doing well with regard to his toe ulcer. He is measuring about the same today but he has come a long way from where this started. Fortunately there is no evidence of active infection which is great news and overall I am very pleased with the progress that is been made. Nonetheless at this point we have completed the Apligraf course he did see improvement through this but I am going to switch to something different as of today. 01/08/2020 upon evaluation today patient appears to be doing much better in regards to his toe  ulcer this is measuring smaller and overall very pleased with the progress. The  Hydrofera Blue seems to be doing a great job. Fortunately there is no evidence of active infection at this time. No fevers, chills, nausea, vomiting, or diarrhea. 01/29/2020 upon evaluation today patient appears to be doing okay in regard to his toe ulcer. The main issue I see is that he actually has trouble right now with callus growing and covering over the wound bed. This prevents him from being able to get a dressing in contact with the wound bed and subsequently fluid collection underneath. I think that we probably need to see him more frequently to keep this pared down and debrided to the point to allow the dressings to effectively heal this wound. I discussed that with the patient today. 02/05/2020 on evaluation today patient appears to be doing well with regard to his wounds currently. In fact he just has 1 on his toe remaining at this point he seems to be doing quite well in my opinion. I am very pleased with the overall appearance today. There is no signs of active infection I do think keeping the callus trimmed down has been of great benefit as far as healing is concerned. I do not think we need to go 3 weeks out like we were in the past I believe that is been somewhat more detrimental than good for him. 02/19/2020 upon evaluation today patient appears to be doing better in regard to his toe ulcer still there is callus buildup around the top of the toe which unfortunately is I think causing some issues here with trapping a little fluid underneath them after cleaning this away. Fortunately there is no evidence of active infection at this time. Overall I feel like the patient is doing quite well which is great news. 03/04/2020 on evaluation today patient appears to be doing well with regard to his toe ulcer though he still has a lot of drying out of the endoform and a lot of callus buildup as well unfortunately. With  that being said I think that he is can require some sharp debridement to clear away some of the callus today. We will see what the wound looks like following. 03/11/2020 on evaluation today patient appears to be doing excellent in regard to his toe ulcer. He did have a lot of callus and even some of the nail, growing over top of the area where the wound is trying to heal. For that reason I did actually trim back some of the nail also did go ahead and trim away some of the callus as well. He tolerated all this without complication the wound appears to be doing well and is showing no signs of infection at this point. Overall I am very pleased. 03/18/2020 upon evaluation today patient appears to be doing well with regard to his toe ulcer. There does not appear to be any significant buildup of callus at this point and overall I do believe that the alginate has been beneficial. Trimming back the toenail also seems to have helped a lot in this regard I think that was causing some rubbing on the end of the toe. Fortunately overall he is doing much better unfortunately he notes that his grandson has been placed on hospice he is 23 and apparently not doing too well. He wants to make a trip to go see him I really think that something he probably should do and not wait for this to try to heal. 04/01/2020 upon evaluation today patient's toe ulcer appears to be doing about the same. He  does have some callus buildup slowly he is making progress but were not completely closed as of yet. I am going to need to perform some debridement today to clear this way. 04/15/2020 on evaluation today patient appears to be doing well in general in regard to his wounds there is no signs of active infection in regard to the toe obviously although he does have a lot of drainage which does raise a bit of a question in that regard. I did discuss with him possibilities for we may want to do treatment wise to try to help out with this. With  that being said I Georgina Peer probably start with a topical antibiotic and continuing with the current dressing for the time being. 04/29/2020 on evaluation today patient appears to be doing decently well in regard to his toe ulcer. There is no signs of active infection at this time. Fortunately I believe that the patient has made some progress since last time I saw him. Still this is taken much longer than I would like to see obviously the patient is ready for this to be done. 05/13/2020 upon evaluation today patient appears to be showing some signs of improvement with regard to his toe ulcer this is great news and overall very pleased in that regard. There is no evidence of active infection currently which is great news as well. No fevers, chills, nausea, vomiting, or diarrhea. 05/20/2020 on evaluation today patient appears to be doing actually very well in regard to his toe ulcer. He did have some dry skin buildup on the surface of the wound along with some drainage and dressing material. I remove this and the wound underneath appears to be doing significantly better. There is a lot of new skin growth and I am very pleased. With that being said I think that we may want to just use a little bit of antibiotic cream over this for the next week and see if we can get this to close. 05/28/2020 upon evaluation today patient appears to be doing well with regard to his toe ulcer. Fortunately there is no signs of active infection at this time. No fevers, chills, nausea, vomiting, or diarrhea. I do believe this is very close to closure but not quite today. 06/10/2020 upon evaluation today patient actually appears to be completely healed and is doing excellent based on what I am seeing today. There does not appear to be any signs of active infection which is great news. Overall I am pleased that that he has finally healed in regard to this wound that has been giving him trouble for much too long. Timothy Gibson, Timothy Gibson  ([id number]) Objective Constitutional Well-nourished and well-hydrated in no acute distress. Vitals Time Taken: 12:49 PM, Height: 76 in, Weight: 244 lbs, BMI: 29.7, Temperature: 98.2 F, Pulse: 103 bpm, Respiratory Rate: 18 breaths/min, Blood Pressure: 198/102 mmHg. General Notes: Dialysis patient BP fluctuates. Respiratory normal breathing without difficulty. Psychiatric this patient is able to make decisions and demonstrates good insight into disease process. Alert and Oriented x 3. pleasant and cooperative. General Notes: His wound bed actually showed signs of good Epithelization at this time. There does not appear to be any evidence of active infection which is great news and overall I am extremely pleased with where things stand today. Integumentary (Hair, Skin) Wound #3 status is Healed - Epithelialized. Original cause of wound was Gradually Appeared. The wound is located on the Left Toe Great. The wound measures 0cm length x 0cm width x 0cm depth;  0cm^2 area and 0cm^3 volume. 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 Would recommend that we discontinue wound care services at this time as the patient appears to be completely healed this is great news. 2. I am also can recommend currently that the patient continue to monitor for any trouble in regard to the toe ulceration. Again it seems only healed but if anything changes he should let me know as soon as possible. We will see him back for follow-up visit as needed. Electronic Signature(s) Signed: 06/10/2020 4:40:39 PM By: Worthy Keeler PA-C Entered By: Worthy Keeler on 06/10/2020 16:40:39 Timothy Gibson, Timothy Gibson  ([id number]) -------------------------------------------------------------------------------- SuperBill Details Patient Name: Timothy Gibson Date of Service: 06/10/2020 Medical Record Number: [id number] Patient Account Number: [id number] Date of Birth/Sex: [birth year] (69 y.o. M) Treating RN: Cornell Barman Primary Care Provider: Merrie Roof Other Clinician: Referring Provider: Merrie Roof Treating Provider/Extender: Skipper Cliche in Treatment: 47 Diagnosis Coding ICD-10 Codes Code Description [phone] Other chronic osteomyelitis, left ankle and foot E11.621 Type 2 diabetes mellitus with foot ulcer L97.522 Non-pressure chronic ulcer of other part of left foot with fat layer exposed I10 Essential (primary) hypertension Facility Procedures CPT4 Code: [id number] Description: [phone] - WOUND CARE VISIT-LEV 2 EST PT Modifier: Quantity: 1 Physician Procedures CPT4 Code: 1761607 Description: 37106 - WC PHYS LEVEL 3 - EST PT Modifier: Quantity: 1 CPT4 Code: Description: ICD-10 Diagnosis Description M86.672 Other chronic osteomyelitis, left ankle and foot E11.621 Type 2 diabetes mellitus with foot ulcer L97.522 Non-pressure chronic ulcer of other part of left foot with fat layer ex I10 Essential (primary)  hypertension Modifier: posed Quantity: Electronic Signature(s) Signed: 06/10/2020 4:42:01 PM By: Worthy Keeler PA-C Entered By: Worthy Keeler on 06/10/2020 16:42:00

## 2020-06-11 NOTE — Progress Notes (Signed)
Timothy Gibson ([id number]) Visit Report for 06/10/2020 Arrival Information Details Patient Name: Timothy Gibson, Timothy Gibson Date of Service: 06/10/2020 12:30 PM Medical Record Number: [id number] Patient Account Number: [id number] Date of Birth/Sex: 1951-03-03 (69 y.o. M) Treating RN: Cornell Barman Primary Care Emmabelle Fear: Merrie Roof Other Clinician: Referring Ewing Fandino: Merrie Roof Treating Blain Hunsucker/Extender: Skipper Cliche in Treatment: 34 Visit Information History Since Last Visit Has Dressing in Place as Prescribed: Yes Patient Arrived: Ambulatory Pain Present Now: No Arrival Time: 12:47 Accompanied By: friend Transfer Assistance: None Patient Identification Verified: Yes Secondary Verification Process Completed: Yes Patient Requires Transmission-Based Precautions: No Patient Has Alerts: No Electronic Signature(s) Signed: 06/11/2020 4:55:43 PM By: Gretta Cool, BSN, RN, CWS, Kim RN, BSN Entered By: Gretta Cool, BSN, RN, CWS, Kim on 06/10/2020 12:49:27 Timothy Gibson ([id number]) -------------------------------------------------------------------------------- Clinic Level of Care Assessment Details Patient Name: Timothy Gibson Date of Service: 06/10/2020 12:30 PM Medical Record Number: [id number] Patient Account Number: [id number] Date of Birth/Sex: 1951/07/07 (69 y.o. M) Treating RN: Cornell Barman Primary Care Josey Dettmann: Merrie Roof Other Clinician: Referring Jonasia Coiner: Merrie Roof Treating Oleva Koo/Extender: Skipper Cliche in Treatment: 47 Clinic Level of Care Assessment Items TOOL 4 Quantity Score []  - Use when only an EandM is performed on FOLLOW-UP visit 0 ASSESSMENTS - Nursing Assessment / Reassessment X - Reassessment of Co-morbidities (includes updates in patient status) 1 10 X- 1 5 Reassessment of Adherence to Treatment Plan ASSESSMENTS - Wound and Skin Assessment / Reassessment X - Simple Wound Assessment / Reassessment - one wound 1 5 []  - 0 Complex Wound Assessment /  Reassessment - multiple wounds []  - 0 Dermatologic / Skin Assessment (not related to wound area) ASSESSMENTS - Focused Assessment []  - Circumferential Edema Measurements - multi extremities 0 []  - 0 Nutritional Assessment / Counseling / Intervention []  - 0 Lower Extremity Assessment (monofilament, tuning fork, pulses) []  - 0 Peripheral Arterial Disease Assessment (using hand held doppler) ASSESSMENTS - Ostomy and/or Continence Assessment and Care []  - Incontinence Assessment and Management 0 []  - 0 Ostomy Care Assessment and Management (repouching, etc.) PROCESS - Coordination of Care X - Simple Patient / Family Education for ongoing care 1 15 []  - 0 Complex (extensive) Patient / Family Education for ongoing care []  - 0 Staff obtains Programmer, systems, Records, Test Results / Process Orders []  - 0 Staff telephones HHA, Nursing Homes / Clarify orders / etc []  - 0 Routine Transfer to another Facility (non-emergent condition) []  - 0 Routine Hospital Admission (non-emergent condition) []  - 0 New Admissions / Biomedical engineer / Ordering NPWT, Apligraf, etc. []  - 0 Emergency Hospital Admission (emergent condition) X- 1 10 Simple Discharge Coordination []  - 0 Complex (extensive) Discharge Coordination PROCESS - Special Needs []  - Pediatric / Minor Patient Management 0 []  - 0 Isolation Patient Management []  - 0 Hearing / Language / Visual special needs []  - 0 Assessment of Community assistance (transportation, D/C planning, etc.) []  - 0 Additional assistance / Altered mentation []  - 0 Support Surface(s) Assessment (bed, cushion, seat, etc.) INTERVENTIONS - Wound Cleansing / Measurement JOSEDE, CICERO. ([id number]) X- 1 5 Simple Wound Cleansing - one wound []  - 0 Complex Wound Cleansing - multiple wounds X- 1 5 Wound Imaging (photographs - any number of wounds) []  - 0 Wound Tracing (instead of photographs) X- 1 5 Simple Wound Measurement - one wound []  - 0 Complex  Wound Measurement - multiple wounds INTERVENTIONS - Wound Dressings []  - Small Wound Dressing one or multiple wounds 0 []  - 0 Medium  Wound Dressing one or multiple wounds []  - 0 Large Wound Dressing one or multiple wounds []  - 0 Application of Medications - topical []  - 0 Application of Medications - injection INTERVENTIONS - Miscellaneous []  - External ear exam 0 []  - 0 Specimen Collection (cultures, biopsies, blood, body fluids, etc.) []  - 0 Specimen(s) / Culture(s) sent or taken to Lab for analysis []  - 0 Patient Transfer (multiple staff / Harrel Lemon Lift / Similar devices) []  - 0 Simple Staple / Suture removal (25 or less) []  - 0 Complex Staple / Suture removal (26 or more) []  - 0 Hypo / Hyperglycemic Management (close monitor of Blood Glucose) []  - 0 Ankle / Brachial Index (ABI) - do not check if billed separately X- 1 5 Vital Signs Has the patient been seen at the hospital within the last three years: Yes Total Score: 65 Level Of Care: New/Established - Level 2 Electronic Signature(s) Signed: 06/11/2020 4:55:43 PM By: Gretta Cool, BSN, RN, CWS, Kim RN, BSN Entered By: Gretta Cool, BSN, RN, CWS, Kim on 06/10/2020 12:56:09 Timothy Gibson ([id number]) -------------------------------------------------------------------------------- Encounter Discharge Information Details Patient Name: Timothy Gibson Date of Service: 06/10/2020 12:30 PM Medical Record Number: [id number] Patient Account Number: [id number] Date of Birth/Sex: 1951-06-30 (69 y.o. M) Treating RN: Cornell Barman Primary Care Mehr Depaoli: Merrie Roof Other Clinician: Referring Demichael Traum: Merrie Roof Treating Addley Ballinger/Extender: Skipper Cliche in Treatment: 80 Encounter Discharge Information Items Discharge Condition: Stable Ambulatory Status: Ambulatory Discharge Destination: Home Transportation: Private Auto Accompanied By: self Schedule Follow-up Appointment: Yes Clinical Summary of Care: Electronic  Signature(s) Signed: 06/10/2020 2:06:19 PM By: Gretta Cool, BSN, RN, CWS, Kim RN, BSN Entered By: Gretta Cool, BSN, RN, CWS, Kim on 06/10/2020 14:06:19 Timothy Gibson ([id number]) -------------------------------------------------------------------------------- Lower Extremity Assessment Details Patient Name: Timothy Gibson Date of Service: 06/10/2020 12:30 PM Medical Record Number: [id number] Patient Account Number: [id number] Date of Birth/Sex: 01/14/1951 (69 y.o. M) Treating RN: Cornell Barman Primary Care Aikam Hellickson: Merrie Roof Other Clinician: Referring Telesforo Brosnahan: Merrie Roof Treating Taleah Bellantoni/Extender: Skipper Cliche in Treatment: 47 Edema Assessment Assessed: [Left: No] [Right: No] Edema: [Left: N] [Right: o] Vascular Assessment Pulses: Dorsalis Pedis Palpable: [Left:Yes] Electronic Signature(s) Signed: 06/11/2020 4:55:43 PM By: Gretta Cool, BSN, RN, CWS, Kim RN, BSN Entered By: Gretta Cool, BSN, RN, CWS, Kim on 06/10/2020 12:53:57 KAVAN, DEVAN ([id number]) -------------------------------------------------------------------------------- Multi Wound Chart Details Patient Name: Timothy Gibson Date of Service: 06/10/2020 12:30 PM Medical Record Number: [id number] Patient Account Number: [id number] Date of Birth/Sex: 1950-07-15 (69 y.o. M) Treating RN: Cornell Barman Primary Care Atlee Kluth: Merrie Roof Other Clinician: Referring Paisyn Guercio: Merrie Roof Treating Jaden Abreu/Extender: Skipper Cliche in Treatment: 37 Vital Signs Height(in): 76 Pulse(bpm): 103 Weight(lbs): 244 Blood Pressure(mmHg): 198/102 Body Mass Index(BMI): 30 Temperature(F): 98.2 Respiratory Rate(breaths/min): 18 Photos: [3:No Photos] [N/A:N/A] Wound Location: [3:Left Toe Great] [N/A:N/A] Wounding Event: [3:Gradually Appeared] [N/A:N/A] Primary Etiology: [3:Diabetic Wound/Ulcer of the Lower N/A Extremity] Date Acquired: [3:06/19/2019] [N/A:N/A] Weeks of Treatment: [3:47] [N/A:N/A] Wound Status: [3:Healed -  Epithelialized] [N/A:N/A] Measurements L x W x D (cm) [3:0x0x0] [N/A:N/A] Area (cm) : [3:0] [N/A:N/A] Volume (cm) : [3:0] [N/A:N/A] % Reduction in Area: [3:100.00%] [N/A:N/A] % Reduction in Volume: [3:100.00% Grade 2] [N/A:N/A N/A] Treatment Notes Electronic Signature(s) Signed: 06/11/2020 4:55:43 PM By: Gretta Cool, BSN, RN, CWS, Kim RN, BSN Entered By: Gretta Cool, BSN, RN, CWS, Kim on 06/10/2020 12:54:41 JODY, SILAS ([id number]) -------------------------------------------------------------------------------- [place] Details Patient Name: Timothy Gibson Date of Service: 06/10/2020 12:30 PM Medical Record Number: [id number] Patient Account Number: [id number] Date of Birth/Sex:  1951/01/12 (69 y.o. M) Treating RN: Cornell Barman Primary Care Xachary Hambly: Merrie Roof Other Clinician: Referring Jibri Schriefer: Merrie Roof Treating Ceili Boshers/Extender: Skipper Cliche in Treatment: 36 Active Inactive Electronic Signature(s) Signed: 06/11/2020 4:55:43 PM By: Gretta Cool, BSN, RN, CWS, Kim RN, BSN Entered By: Gretta Cool, BSN, RN, CWS, Kim on 06/10/2020 12:54:22 HENCE, DERRICK ([id number]) -------------------------------------------------------------------------------- Pain Assessment Details Patient Name: Timothy Gibson Date of Service: 06/10/2020 12:30 PM Medical Record Number: [id number] Patient Account Number: [id number] Date of Birth/Sex: 07/11/1950 (69 y.o. M) Treating RN: Cornell Barman Primary Care Shivan Hodes: Merrie Roof Other Clinician: Referring Samil Mecham: Merrie Roof Treating Saamir Armstrong/Extender: Skipper Cliche in Treatment: 47 Active Problems Location of Pain Severity and Description of Pain Patient Has Paino No Site Locations Pain Management and Medication Current Pain Management: Electronic Signature(s) Signed: 06/11/2020 4:55:43 PM By: Gretta Cool, BSN, RN, CWS, Kim RN, BSN Entered By: Gretta Cool, BSN, RN, CWS, Kim on 06/10/2020 12:51:31 Timothy Gibson  ([id number]) -------------------------------------------------------------------------------- Patient/Caregiver Education Details Patient Name: Timothy Gibson Date of Service: 06/10/2020 12:30 PM Medical Record Number: [id number] Patient Account Number: [id number] Date of Birth/Gender: [birth year] (69 y.o. M) Treating RN: Cornell Barman Primary Care Physician: Merrie Roof Other Clinician: Referring Physician: Merrie Roof Treating Physician/Extender: Skipper Cliche in Treatment: 40 Education Assessment Education Provided To: Patient Education Topics Provided Offloading: Handouts: Other: Protect toes from pressure Methods: Demonstration, Explain/Verbal Responses: State content correctly Electronic Signature(s) Signed: 06/11/2020 4:55:43 PM By: Gretta Cool, BSN, RN, CWS, Kim RN, BSN Entered By: Gretta Cool, BSN, RN, CWS, Kim on 06/10/2020 14:04:49 Timothy Gibson ([id number]) -------------------------------------------------------------------------------- Wound Assessment Details Patient Name: Timothy Gibson Date of Service: 06/10/2020 12:30 PM Medical Record Number: [id number] Patient Account Number: [id number] Date of Birth/Sex: January 20, 1951 (69 y.o. M) Treating RN: Cornell Barman Primary Care Quatisha Zylka: Merrie Roof Other Clinician: Referring Zephaniah Enyeart: Merrie Roof Treating Taylynn Easton/Extender: Skipper Cliche in Treatment: 47 Wound Status Wound Number: 3 Primary Etiology: Diabetic Wound/Ulcer of the Lower Extremity Wound Location: Left Toe Great Wound Status: Healed - Epithelialized Wounding Event: Gradually Appeared Date Acquired: 06/19/2019 Weeks Of Treatment: 47 Clustered Wound: No Photos Photo Uploaded By: Gretta Cool, BSN, RN, CWS, Kim on 06/10/2020 15:56:42 Wound Measurements Length: (cm) 0 Width: (cm) 0 Depth: (cm) 0 Area: (cm) 0 Volume: (cm) 0 % Reduction in Area: 100% % Reduction in Volume: 100% Wound Description Classification: Grade 2 Electronic Signature(s) Signed:  06/11/2020 4:55:43 PM By: Gretta Cool, BSN, RN, CWS, Kim RN, BSN Entered By: Gretta Cool, BSN, RN, CWS, Kim on 06/10/2020 12:53:45 Timothy Gibson ([id number]) -------------------------------------------------------------------------------- [place] Details Patient Name: Timothy Gibson Date of Service: 06/10/2020 12:30 PM Medical Record Number: [id number] Patient Account Number: [id number] Date of Birth/Sex: [birth year] (69 y.o. M) Treating RN: Cornell Barman Primary Care Brentley Horrell: Merrie Roof Other Clinician: Referring Jamelle Goldston: Merrie Roof Treating Terre Hanneman/Extender: Skipper Cliche in Treatment: 47 Vital Signs Time Taken: 12:49 Temperature (F): 98.2 Height (in): 76 Pulse (bpm): 103 Weight (lbs): 244 Respiratory Rate (breaths/min): 18 Body Mass Index (BMI): 29.7 Blood Pressure (mmHg): 198/102 Reference Range: 80 - 120 mg / dl Notes Dialysis patient BP fluctuates. Electronic Signature(s) Signed: 06/11/2020 4:55:43 PM By: Gretta Cool, BSN, RN, CWS, Kim RN, BSN Entered By: Gretta Cool, BSN, RN, CWS, Kim on 06/10/2020 12:51:25

## 2020-06-14 DIAGNOSIS — N186 End stage renal disease: Secondary | ICD-10-CM | POA: Diagnosis not present

## 2020-06-14 DIAGNOSIS — D631 Anemia in chronic kidney disease: Secondary | ICD-10-CM | POA: Diagnosis not present

## 2020-06-14 DIAGNOSIS — N2581 Secondary hyperparathyroidism of renal origin: Secondary | ICD-10-CM | POA: Diagnosis not present

## 2020-06-14 DIAGNOSIS — Z992 Dependence on renal dialysis: Secondary | ICD-10-CM | POA: Diagnosis not present

## 2020-06-14 DIAGNOSIS — D509 Iron deficiency anemia, unspecified: Secondary | ICD-10-CM | POA: Diagnosis not present

## 2020-06-16 DIAGNOSIS — D509 Iron deficiency anemia, unspecified: Secondary | ICD-10-CM | POA: Diagnosis not present

## 2020-06-16 DIAGNOSIS — N2581 Secondary hyperparathyroidism of renal origin: Secondary | ICD-10-CM | POA: Diagnosis not present

## 2020-06-16 DIAGNOSIS — D631 Anemia in chronic kidney disease: Secondary | ICD-10-CM | POA: Diagnosis not present

## 2020-06-16 DIAGNOSIS — N186 End stage renal disease: Secondary | ICD-10-CM | POA: Diagnosis not present

## 2020-06-16 DIAGNOSIS — Z992 Dependence on renal dialysis: Secondary | ICD-10-CM | POA: Diagnosis not present

## 2020-06-18 DIAGNOSIS — N186 End stage renal disease: Secondary | ICD-10-CM | POA: Diagnosis not present

## 2020-06-18 DIAGNOSIS — Z992 Dependence on renal dialysis: Secondary | ICD-10-CM | POA: Diagnosis not present

## 2020-06-18 DIAGNOSIS — D509 Iron deficiency anemia, unspecified: Secondary | ICD-10-CM | POA: Diagnosis not present

## 2020-06-18 DIAGNOSIS — N2581 Secondary hyperparathyroidism of renal origin: Secondary | ICD-10-CM | POA: Diagnosis not present

## 2020-06-18 DIAGNOSIS — D631 Anemia in chronic kidney disease: Secondary | ICD-10-CM | POA: Diagnosis not present

## 2020-06-21 DIAGNOSIS — D509 Iron deficiency anemia, unspecified: Secondary | ICD-10-CM | POA: Diagnosis not present

## 2020-06-21 DIAGNOSIS — D631 Anemia in chronic kidney disease: Secondary | ICD-10-CM | POA: Diagnosis not present

## 2020-06-21 DIAGNOSIS — N2581 Secondary hyperparathyroidism of renal origin: Secondary | ICD-10-CM | POA: Diagnosis not present

## 2020-06-21 DIAGNOSIS — Z992 Dependence on renal dialysis: Secondary | ICD-10-CM | POA: Diagnosis not present

## 2020-06-21 DIAGNOSIS — N186 End stage renal disease: Secondary | ICD-10-CM | POA: Diagnosis not present

## 2020-06-23 DIAGNOSIS — N186 End stage renal disease: Secondary | ICD-10-CM | POA: Diagnosis not present

## 2020-06-23 DIAGNOSIS — D631 Anemia in chronic kidney disease: Secondary | ICD-10-CM | POA: Diagnosis not present

## 2020-06-23 DIAGNOSIS — Z992 Dependence on renal dialysis: Secondary | ICD-10-CM | POA: Diagnosis not present

## 2020-06-23 DIAGNOSIS — N2581 Secondary hyperparathyroidism of renal origin: Secondary | ICD-10-CM | POA: Diagnosis not present

## 2020-06-23 DIAGNOSIS — D509 Iron deficiency anemia, unspecified: Secondary | ICD-10-CM | POA: Diagnosis not present

## 2020-06-25 DIAGNOSIS — N186 End stage renal disease: Secondary | ICD-10-CM | POA: Diagnosis not present

## 2020-06-25 DIAGNOSIS — D509 Iron deficiency anemia, unspecified: Secondary | ICD-10-CM | POA: Diagnosis not present

## 2020-06-25 DIAGNOSIS — Z992 Dependence on renal dialysis: Secondary | ICD-10-CM | POA: Diagnosis not present

## 2020-06-25 DIAGNOSIS — D631 Anemia in chronic kidney disease: Secondary | ICD-10-CM | POA: Diagnosis not present

## 2020-06-25 DIAGNOSIS — N2581 Secondary hyperparathyroidism of renal origin: Secondary | ICD-10-CM | POA: Diagnosis not present

## 2020-06-28 DIAGNOSIS — N186 End stage renal disease: Secondary | ICD-10-CM | POA: Diagnosis not present

## 2020-06-28 DIAGNOSIS — D631 Anemia in chronic kidney disease: Secondary | ICD-10-CM | POA: Diagnosis not present

## 2020-06-28 DIAGNOSIS — D509 Iron deficiency anemia, unspecified: Secondary | ICD-10-CM | POA: Diagnosis not present

## 2020-06-28 DIAGNOSIS — Z992 Dependence on renal dialysis: Secondary | ICD-10-CM | POA: Diagnosis not present

## 2020-06-28 DIAGNOSIS — N2581 Secondary hyperparathyroidism of renal origin: Secondary | ICD-10-CM | POA: Diagnosis not present

## 2020-06-30 DIAGNOSIS — N2581 Secondary hyperparathyroidism of renal origin: Secondary | ICD-10-CM | POA: Diagnosis not present

## 2020-06-30 DIAGNOSIS — Z992 Dependence on renal dialysis: Secondary | ICD-10-CM | POA: Diagnosis not present

## 2020-06-30 DIAGNOSIS — N186 End stage renal disease: Secondary | ICD-10-CM | POA: Diagnosis not present

## 2020-06-30 DIAGNOSIS — H903 Sensorineural hearing loss, bilateral: Secondary | ICD-10-CM | POA: Diagnosis not present

## 2020-06-30 DIAGNOSIS — D509 Iron deficiency anemia, unspecified: Secondary | ICD-10-CM | POA: Diagnosis not present

## 2020-06-30 DIAGNOSIS — D631 Anemia in chronic kidney disease: Secondary | ICD-10-CM | POA: Diagnosis not present

## 2020-07-02 DIAGNOSIS — D631 Anemia in chronic kidney disease: Secondary | ICD-10-CM | POA: Diagnosis not present

## 2020-07-02 DIAGNOSIS — D509 Iron deficiency anemia, unspecified: Secondary | ICD-10-CM | POA: Diagnosis not present

## 2020-07-02 DIAGNOSIS — Z992 Dependence on renal dialysis: Secondary | ICD-10-CM | POA: Diagnosis not present

## 2020-07-02 DIAGNOSIS — N2581 Secondary hyperparathyroidism of renal origin: Secondary | ICD-10-CM | POA: Diagnosis not present

## 2020-07-02 DIAGNOSIS — N186 End stage renal disease: Secondary | ICD-10-CM | POA: Diagnosis not present

## 2020-07-05 DIAGNOSIS — N2581 Secondary hyperparathyroidism of renal origin: Secondary | ICD-10-CM | POA: Diagnosis not present

## 2020-07-05 DIAGNOSIS — N186 End stage renal disease: Secondary | ICD-10-CM | POA: Diagnosis not present

## 2020-07-05 DIAGNOSIS — D631 Anemia in chronic kidney disease: Secondary | ICD-10-CM | POA: Diagnosis not present

## 2020-07-05 DIAGNOSIS — D509 Iron deficiency anemia, unspecified: Secondary | ICD-10-CM | POA: Diagnosis not present

## 2020-07-05 DIAGNOSIS — Z992 Dependence on renal dialysis: Secondary | ICD-10-CM | POA: Diagnosis not present

## 2020-07-07 DIAGNOSIS — Z992 Dependence on renal dialysis: Secondary | ICD-10-CM | POA: Diagnosis not present

## 2020-07-07 DIAGNOSIS — N186 End stage renal disease: Secondary | ICD-10-CM | POA: Diagnosis not present

## 2020-07-07 DIAGNOSIS — N2581 Secondary hyperparathyroidism of renal origin: Secondary | ICD-10-CM | POA: Diagnosis not present

## 2020-07-07 DIAGNOSIS — D509 Iron deficiency anemia, unspecified: Secondary | ICD-10-CM | POA: Diagnosis not present

## 2020-07-07 DIAGNOSIS — D631 Anemia in chronic kidney disease: Secondary | ICD-10-CM | POA: Diagnosis not present

## 2020-07-09 DIAGNOSIS — D631 Anemia in chronic kidney disease: Secondary | ICD-10-CM | POA: Diagnosis not present

## 2020-07-09 DIAGNOSIS — N2581 Secondary hyperparathyroidism of renal origin: Secondary | ICD-10-CM | POA: Diagnosis not present

## 2020-07-09 DIAGNOSIS — Z992 Dependence on renal dialysis: Secondary | ICD-10-CM | POA: Diagnosis not present

## 2020-07-09 DIAGNOSIS — N186 End stage renal disease: Secondary | ICD-10-CM | POA: Diagnosis not present

## 2020-07-09 DIAGNOSIS — D509 Iron deficiency anemia, unspecified: Secondary | ICD-10-CM | POA: Diagnosis not present

## 2020-07-11 ENCOUNTER — Other Ambulatory Visit: Payer: Self-pay | Admitting: Family Medicine

## 2020-07-12 ENCOUNTER — Other Ambulatory Visit: Payer: Self-pay | Admitting: Family Medicine

## 2020-07-13 DIAGNOSIS — Z23 Encounter for immunization: Secondary | ICD-10-CM | POA: Diagnosis not present

## 2020-07-14 DIAGNOSIS — Z992 Dependence on renal dialysis: Secondary | ICD-10-CM | POA: Diagnosis not present

## 2020-07-14 DIAGNOSIS — D509 Iron deficiency anemia, unspecified: Secondary | ICD-10-CM | POA: Diagnosis not present

## 2020-07-14 DIAGNOSIS — N186 End stage renal disease: Secondary | ICD-10-CM | POA: Diagnosis not present

## 2020-07-14 DIAGNOSIS — D631 Anemia in chronic kidney disease: Secondary | ICD-10-CM | POA: Diagnosis not present

## 2020-07-14 DIAGNOSIS — N2581 Secondary hyperparathyroidism of renal origin: Secondary | ICD-10-CM | POA: Diagnosis not present

## 2020-07-16 DIAGNOSIS — Z992 Dependence on renal dialysis: Secondary | ICD-10-CM | POA: Diagnosis not present

## 2020-07-16 DIAGNOSIS — D631 Anemia in chronic kidney disease: Secondary | ICD-10-CM | POA: Diagnosis not present

## 2020-07-16 DIAGNOSIS — N2581 Secondary hyperparathyroidism of renal origin: Secondary | ICD-10-CM | POA: Diagnosis not present

## 2020-07-16 DIAGNOSIS — N186 End stage renal disease: Secondary | ICD-10-CM | POA: Diagnosis not present

## 2020-07-16 DIAGNOSIS — D509 Iron deficiency anemia, unspecified: Secondary | ICD-10-CM | POA: Diagnosis not present

## 2020-07-19 DIAGNOSIS — N2581 Secondary hyperparathyroidism of renal origin: Secondary | ICD-10-CM | POA: Diagnosis not present

## 2020-07-19 DIAGNOSIS — D509 Iron deficiency anemia, unspecified: Secondary | ICD-10-CM | POA: Diagnosis not present

## 2020-07-19 DIAGNOSIS — D631 Anemia in chronic kidney disease: Secondary | ICD-10-CM | POA: Diagnosis not present

## 2020-07-19 DIAGNOSIS — N186 End stage renal disease: Secondary | ICD-10-CM | POA: Diagnosis not present

## 2020-07-19 DIAGNOSIS — E119 Type 2 diabetes mellitus without complications: Secondary | ICD-10-CM | POA: Diagnosis not present

## 2020-07-19 DIAGNOSIS — Z992 Dependence on renal dialysis: Secondary | ICD-10-CM | POA: Diagnosis not present

## 2020-07-20 ENCOUNTER — Telehealth (INDEPENDENT_AMBULATORY_CARE_PROVIDER_SITE_OTHER): Payer: Medicare Other | Admitting: Family Medicine

## 2020-07-20 ENCOUNTER — Encounter: Payer: Self-pay | Admitting: Family Medicine

## 2020-07-20 DIAGNOSIS — I152 Hypertension secondary to endocrine disorders: Secondary | ICD-10-CM

## 2020-07-20 DIAGNOSIS — E1159 Type 2 diabetes mellitus with other circulatory complications: Secondary | ICD-10-CM | POA: Diagnosis not present

## 2020-07-20 DIAGNOSIS — E1165 Type 2 diabetes mellitus with hyperglycemia: Secondary | ICD-10-CM | POA: Diagnosis not present

## 2020-07-20 DIAGNOSIS — J449 Chronic obstructive pulmonary disease, unspecified: Secondary | ICD-10-CM | POA: Diagnosis not present

## 2020-07-20 MED ORDER — TRESIBA FLEXTOUCH 100 UNIT/ML ~~LOC~~ SOPN: PEN_INJECTOR | SUBCUTANEOUS | 3 refills | Status: DC

## 2020-07-20 MED ORDER — ALBUTEROL SULFATE HFA 108 (90 BASE) MCG/ACT IN AERS: 2.0000 | INHALATION_SPRAY | Freq: Four times a day (QID) | RESPIRATORY_TRACT | 5 refills | Status: DC | PRN

## 2020-07-20 MED ORDER — ANORO ELLIPTA 62.5-25 MCG/INH IN AEPB: 1.0000 | INHALATION_SPRAY | Freq: Every day | RESPIRATORY_TRACT | 1 refills | Status: DC

## 2020-07-20 MED ORDER — LOSARTAN POTASSIUM 100 MG PO TABS
ORAL_TABLET | ORAL | 1 refills | Status: DC
Start: 2020-07-20 — End: 2021-01-26

## 2020-07-20 MED ORDER — AMLODIPINE BESYLATE 5 MG PO TABS: ORAL_TABLET | ORAL | 1 refills | Status: DC

## 2020-07-20 NOTE — Progress Notes (Signed)
There were no vitals taken for this visit.   Subjective:    Patient ID: Timothy Gibson, male    DOB: 1951/04/11, 70 y.o.   MRN: [id number]  HPI: Timothy Gibson is a 70 y.o. male  Chief Complaint  Patient presents with  . Diabetes  . Hypertension   HYPERTENSION Hypertension status: controlled  Satisfied with current treatment? yes Duration of hypertension: chronic BP monitoring frequency:  not checking BP medication side effects:  no Medication compliance: excellent compliance Previous BP meds: amlodipine, losartan Aspirin: no Recurrent headaches: no Visual changes: no Palpitations: no Dyspnea: no Chest pain: no Lower extremity edema: no Dizzy/lightheaded: no  DIABETES Hypoglycemic episodes:no Polydipsia/polyuria: no Visual disturbance: yes Chest pain: no Paresthesias: no Glucose Monitoring: no  Accucheck frequency: Not Checking Taking Insulin?: no  Long acting insulin: 10 units at bedtime Blood Pressure Monitoring: not checking Retinal Examination: Up to Date Foot Exam: Up to Date Diabetic Education: Completed Pneumovax: Up to Date Influenza: Up to Date Aspirin: no  Relevant past medical, surgical, family and social history reviewed and updated as indicated. Interim medical history since our last visit reviewed. Allergies and medications reviewed and updated.  Review of Systems  Constitutional: Negative.   Respiratory: Negative.   Cardiovascular: Negative.   Gastrointestinal: Negative.   Musculoskeletal: Negative.   Neurological: Negative.   Psychiatric/Behavioral: Negative.     Per HPI unless specifically indicated above     Objective:    There were no vitals taken for this visit.  Wt Readings from Last 3 Encounters:  05/21/20 256 lb (116.1 kg)  05/18/20 256 lb (116.1 kg)  05/10/20 257 lb 12.8 oz (116.9 kg)    Physical Exam Vitals and nursing note reviewed.  Pulmonary:     Effort: Pulmonary effort is normal. No respiratory distress.      Comments: Speaking in full sentences Neurological:     Mental Status: He is alert.  Psychiatric:        Mood and Affect: Mood normal.        Behavior: Behavior normal.        Thought Content: Thought content normal.        Judgment: Judgment normal.     Results for orders placed or performed in visit on 05/10/20  CBC with Differential/Platelet  Result Value Ref Range   WBC 3.7 3.4 - 10.8 x10E3/uL   RBC 3.13 (L) 4.14 - 5.80 x10E6/uL   Hemoglobin 9.9 (L) 13.0 - 17.7 g/dL   Hematocrit 30.3 (L) 37.5 - 51.0 %   MCV 97 79 - 97 fL   MCH 31.6 26.6 - 33.0 pg   MCHC 32.7 31.5 - 35.7 g/dL   RDW 12.7 11.6 - 15.4 %   Platelets 127 (L) 150 - 450 x10E3/uL   Neutrophils 65 Not Estab. %   Lymphs 22 Not Estab. %   Monocytes 6 Not Estab. %   Eos 6 Not Estab. %   Basos 1 Not Estab. %   Neutrophils Absolute 2.4 1.4 - 7.0 x10E3/uL   Lymphocytes Absolute 0.8 0.7 - 3.1 x10E3/uL   Monocytes Absolute 0.2 0.1 - 0.9 x10E3/uL   EOS (ABSOLUTE) 0.2 0.0 - 0.4 x10E3/uL   Basophils Absolute 0.0 0.0 - 0.2 x10E3/uL   Immature Granulocytes 0 Not Estab. %   Immature Grans (Abs) 0.0 0.0 - 0.1 x10E3/uL  Comprehensive metabolic panel  Result Value Ref Range   Glucose 182 (H) 65 - 99 mg/dL   BUN 23 8 - 27 mg/dL  Creatinine, Ser 7.63 (H) 0.76 - 1.27 mg/dL   GFR calc non Af Amer 7 (L) >59 mL/min/1.73   GFR calc Af Amer 8 (L) >59 mL/min/1.73   BUN/Creatinine Ratio 3 (L) 10 - 24   Sodium 142 134 - 144 mmol/L   Potassium 4.0 3.5 - 5.2 mmol/L   Chloride 97 96 - 106 mmol/L   CO2 31 (H) 20 - 29 mmol/L   Calcium 8.7 8.6 - 10.2 mg/dL   Total Protein 6.6 6.0 - 8.5 g/dL   Albumin 3.7 (L) 3.8 - 4.8 g/dL   Globulin, Total 2.9 1.5 - 4.5 g/dL   Albumin/Globulin Ratio 1.3 1.2 - 2.2   Bilirubin Total 0.3 0.0 - 1.2 mg/dL   Alkaline Phosphatase 94 44 - 121 IU/L   AST 12 0 - 40 IU/L   ALT 8 0 - 44 IU/L  Lipid Panel w/o Chol/HDL Ratio  Result Value Ref Range   Cholesterol, Total 99 (L) 100 - 199 mg/dL   Triglycerides  74 0 - 149 mg/dL   HDL 36 (L) >39 mg/dL   VLDL Cholesterol Cal 16 5 - 40 mg/dL   LDL Chol Calc (NIH) 47 0 - 99 mg/dL  TSH  Result Value Ref Range   TSH 0.686 0.450 - 4.500 uIU/mL  Bayer DCA Hb A1c Waived  Result Value Ref Range   HB A1C (BAYER DCA - WAIVED) 5.6 <7.0 %      Assessment & Plan:   Problem List Items Addressed This Visit      Cardiovascular and Mediastinum   Hypertension associated with diabetes ([place])    States that he is doing well. Just had blood work done through dialysis. Will obtain. Refills given today. Call with any concerns. Continue to monitor.       Relevant Medications   losartan (COZAAR) 100 MG tablet   insulin degludec (TRESIBA FLEXTOUCH) 100 UNIT/ML FlexTouch Pen   amLODipine (NORVASC) 5 MG tablet     Respiratory   COPD (chronic obstructive pulmonary disease) (HCC)    Under good control on current regimen. Continue current regimen. Continue to monitor. Call with any concerns. Refills given.        Relevant Medications   umeclidinium-vilanterol (ANORO ELLIPTA) 62.5-25 MCG/INH AEPB   albuterol (VENTOLIN HFA) 108 (90 Base) MCG/ACT inhaler     Endocrine   Type 2 diabetes mellitus with hyperglycemia (HCC)    Feeling well, states that he just got his A1c done last week. We will obtain records and treat as needed. Refills given. Call with any concerns.       Relevant Medications   losartan (COZAAR) 100 MG tablet   insulin degludec (TRESIBA FLEXTOUCH) 100 UNIT/ML FlexTouch Pen       Follow up plan: Return in about 3 months (around 10/18/2020).    . This visit was completed via telephone due to the restrictions of the COVID-19 pandemic. All issues as above were discussed and addressed but no physical exam was performed. If it was felt that the patient should be evaluated in the office, they were directed there. The patient verbally consented to this visit. Patient was unable to complete an audio/visual visit due to Lack of equipment. Due to the  catastrophic nature of the COVID-19 pandemic, this visit was done through audio contact only. . Location of the patient: home . Location of the provider: home . Those involved with this call:  . Provider: Park Liter, DO . CMA: Yvonna Alanis, [facility] . Front Desk/Registration: Jill Side  .  Time spent on call: 25 minutes on the phone discussing health concerns. 40 minutes total spent in review of patient's record and preparation of their chart.

## 2020-07-20 NOTE — Assessment & Plan Note (Signed)
States that he is doing well. Just had blood work done through dialysis. Will obtain. Refills given today. Call with any concerns. Continue to monitor.

## 2020-07-20 NOTE — Assessment & Plan Note (Signed)
Under good control on current regimen. Continue current regimen. Continue to monitor. Call with any concerns. Refills given.   

## 2020-07-20 NOTE — Assessment & Plan Note (Signed)
Feeling well, states that he just got his A1c done last week. We will obtain records and treat as needed. Refills given. Call with any concerns.

## 2020-07-21 ENCOUNTER — Telehealth: Payer: Self-pay

## 2020-07-21 DIAGNOSIS — N2581 Secondary hyperparathyroidism of renal origin: Secondary | ICD-10-CM | POA: Diagnosis not present

## 2020-07-21 DIAGNOSIS — D631 Anemia in chronic kidney disease: Secondary | ICD-10-CM | POA: Diagnosis not present

## 2020-07-21 DIAGNOSIS — N186 End stage renal disease: Secondary | ICD-10-CM | POA: Diagnosis not present

## 2020-07-21 DIAGNOSIS — Z992 Dependence on renal dialysis: Secondary | ICD-10-CM | POA: Diagnosis not present

## 2020-07-21 DIAGNOSIS — D509 Iron deficiency anemia, unspecified: Secondary | ICD-10-CM | POA: Diagnosis not present

## 2020-07-21 NOTE — Telephone Encounter (Signed)
lvm to make this apt.  

## 2020-07-21 NOTE — Telephone Encounter (Signed)
-----   Message from Valerie Roys, DO sent at 07/20/2020  9:37 AM EST ----- 3 months

## 2020-07-23 DIAGNOSIS — D509 Iron deficiency anemia, unspecified: Secondary | ICD-10-CM | POA: Diagnosis not present

## 2020-07-23 DIAGNOSIS — D631 Anemia in chronic kidney disease: Secondary | ICD-10-CM | POA: Diagnosis not present

## 2020-07-23 DIAGNOSIS — N186 End stage renal disease: Secondary | ICD-10-CM | POA: Diagnosis not present

## 2020-07-23 DIAGNOSIS — N2581 Secondary hyperparathyroidism of renal origin: Secondary | ICD-10-CM | POA: Diagnosis not present

## 2020-07-23 DIAGNOSIS — Z992 Dependence on renal dialysis: Secondary | ICD-10-CM | POA: Diagnosis not present

## 2020-07-26 DIAGNOSIS — N2581 Secondary hyperparathyroidism of renal origin: Secondary | ICD-10-CM | POA: Diagnosis not present

## 2020-07-26 DIAGNOSIS — D509 Iron deficiency anemia, unspecified: Secondary | ICD-10-CM | POA: Diagnosis not present

## 2020-07-26 DIAGNOSIS — N186 End stage renal disease: Secondary | ICD-10-CM | POA: Diagnosis not present

## 2020-07-26 DIAGNOSIS — D631 Anemia in chronic kidney disease: Secondary | ICD-10-CM | POA: Diagnosis not present

## 2020-07-26 DIAGNOSIS — Z992 Dependence on renal dialysis: Secondary | ICD-10-CM | POA: Diagnosis not present

## 2020-07-28 DIAGNOSIS — Z992 Dependence on renal dialysis: Secondary | ICD-10-CM | POA: Diagnosis not present

## 2020-07-28 DIAGNOSIS — N2581 Secondary hyperparathyroidism of renal origin: Secondary | ICD-10-CM | POA: Diagnosis not present

## 2020-07-28 DIAGNOSIS — D631 Anemia in chronic kidney disease: Secondary | ICD-10-CM | POA: Diagnosis not present

## 2020-07-28 DIAGNOSIS — N186 End stage renal disease: Secondary | ICD-10-CM | POA: Diagnosis not present

## 2020-07-28 DIAGNOSIS — D509 Iron deficiency anemia, unspecified: Secondary | ICD-10-CM | POA: Diagnosis not present

## 2020-07-30 DIAGNOSIS — Z992 Dependence on renal dialysis: Secondary | ICD-10-CM | POA: Diagnosis not present

## 2020-07-30 DIAGNOSIS — N2581 Secondary hyperparathyroidism of renal origin: Secondary | ICD-10-CM | POA: Diagnosis not present

## 2020-07-30 DIAGNOSIS — D509 Iron deficiency anemia, unspecified: Secondary | ICD-10-CM | POA: Diagnosis not present

## 2020-07-30 DIAGNOSIS — N186 End stage renal disease: Secondary | ICD-10-CM | POA: Diagnosis not present

## 2020-07-30 DIAGNOSIS — D631 Anemia in chronic kidney disease: Secondary | ICD-10-CM | POA: Diagnosis not present

## 2020-08-02 DIAGNOSIS — N186 End stage renal disease: Secondary | ICD-10-CM | POA: Diagnosis not present

## 2020-08-02 DIAGNOSIS — N2581 Secondary hyperparathyroidism of renal origin: Secondary | ICD-10-CM | POA: Diagnosis not present

## 2020-08-02 DIAGNOSIS — D509 Iron deficiency anemia, unspecified: Secondary | ICD-10-CM | POA: Diagnosis not present

## 2020-08-02 DIAGNOSIS — D631 Anemia in chronic kidney disease: Secondary | ICD-10-CM | POA: Diagnosis not present

## 2020-08-02 DIAGNOSIS — Z992 Dependence on renal dialysis: Secondary | ICD-10-CM | POA: Diagnosis not present

## 2020-08-04 DIAGNOSIS — D509 Iron deficiency anemia, unspecified: Secondary | ICD-10-CM | POA: Diagnosis not present

## 2020-08-04 DIAGNOSIS — D631 Anemia in chronic kidney disease: Secondary | ICD-10-CM | POA: Diagnosis not present

## 2020-08-04 DIAGNOSIS — N186 End stage renal disease: Secondary | ICD-10-CM | POA: Diagnosis not present

## 2020-08-04 DIAGNOSIS — Z992 Dependence on renal dialysis: Secondary | ICD-10-CM | POA: Diagnosis not present

## 2020-08-04 DIAGNOSIS — N2581 Secondary hyperparathyroidism of renal origin: Secondary | ICD-10-CM | POA: Diagnosis not present

## 2020-08-06 DIAGNOSIS — N2581 Secondary hyperparathyroidism of renal origin: Secondary | ICD-10-CM | POA: Diagnosis not present

## 2020-08-06 DIAGNOSIS — Z992 Dependence on renal dialysis: Secondary | ICD-10-CM | POA: Diagnosis not present

## 2020-08-06 DIAGNOSIS — N186 End stage renal disease: Secondary | ICD-10-CM | POA: Diagnosis not present

## 2020-08-06 DIAGNOSIS — D509 Iron deficiency anemia, unspecified: Secondary | ICD-10-CM | POA: Diagnosis not present

## 2020-08-06 DIAGNOSIS — D631 Anemia in chronic kidney disease: Secondary | ICD-10-CM | POA: Diagnosis not present

## 2020-08-09 DIAGNOSIS — Z992 Dependence on renal dialysis: Secondary | ICD-10-CM | POA: Diagnosis not present

## 2020-08-09 DIAGNOSIS — D631 Anemia in chronic kidney disease: Secondary | ICD-10-CM | POA: Diagnosis not present

## 2020-08-09 DIAGNOSIS — N186 End stage renal disease: Secondary | ICD-10-CM | POA: Diagnosis not present

## 2020-08-09 DIAGNOSIS — N2581 Secondary hyperparathyroidism of renal origin: Secondary | ICD-10-CM | POA: Diagnosis not present

## 2020-08-09 DIAGNOSIS — D509 Iron deficiency anemia, unspecified: Secondary | ICD-10-CM | POA: Diagnosis not present

## 2020-08-11 DIAGNOSIS — N186 End stage renal disease: Secondary | ICD-10-CM | POA: Diagnosis not present

## 2020-08-11 DIAGNOSIS — D631 Anemia in chronic kidney disease: Secondary | ICD-10-CM | POA: Diagnosis not present

## 2020-08-11 DIAGNOSIS — D509 Iron deficiency anemia, unspecified: Secondary | ICD-10-CM | POA: Diagnosis not present

## 2020-08-11 DIAGNOSIS — N2581 Secondary hyperparathyroidism of renal origin: Secondary | ICD-10-CM | POA: Diagnosis not present

## 2020-08-11 DIAGNOSIS — Z992 Dependence on renal dialysis: Secondary | ICD-10-CM | POA: Diagnosis not present

## 2020-08-13 DIAGNOSIS — D509 Iron deficiency anemia, unspecified: Secondary | ICD-10-CM | POA: Diagnosis not present

## 2020-08-13 DIAGNOSIS — Z992 Dependence on renal dialysis: Secondary | ICD-10-CM | POA: Diagnosis not present

## 2020-08-13 DIAGNOSIS — D631 Anemia in chronic kidney disease: Secondary | ICD-10-CM | POA: Diagnosis not present

## 2020-08-13 DIAGNOSIS — N186 End stage renal disease: Secondary | ICD-10-CM | POA: Diagnosis not present

## 2020-08-13 DIAGNOSIS — N2581 Secondary hyperparathyroidism of renal origin: Secondary | ICD-10-CM | POA: Diagnosis not present

## 2020-08-16 DIAGNOSIS — D631 Anemia in chronic kidney disease: Secondary | ICD-10-CM | POA: Diagnosis not present

## 2020-08-16 DIAGNOSIS — N2581 Secondary hyperparathyroidism of renal origin: Secondary | ICD-10-CM | POA: Diagnosis not present

## 2020-08-16 DIAGNOSIS — D509 Iron deficiency anemia, unspecified: Secondary | ICD-10-CM | POA: Diagnosis not present

## 2020-08-16 DIAGNOSIS — N186 End stage renal disease: Secondary | ICD-10-CM | POA: Diagnosis not present

## 2020-08-16 DIAGNOSIS — Z992 Dependence on renal dialysis: Secondary | ICD-10-CM | POA: Diagnosis not present

## 2020-08-18 DIAGNOSIS — Z992 Dependence on renal dialysis: Secondary | ICD-10-CM | POA: Diagnosis not present

## 2020-08-18 DIAGNOSIS — N186 End stage renal disease: Secondary | ICD-10-CM | POA: Diagnosis not present

## 2020-08-18 DIAGNOSIS — D631 Anemia in chronic kidney disease: Secondary | ICD-10-CM | POA: Diagnosis not present

## 2020-08-18 DIAGNOSIS — D509 Iron deficiency anemia, unspecified: Secondary | ICD-10-CM | POA: Diagnosis not present

## 2020-08-18 DIAGNOSIS — N2581 Secondary hyperparathyroidism of renal origin: Secondary | ICD-10-CM | POA: Diagnosis not present

## 2020-08-20 DIAGNOSIS — N186 End stage renal disease: Secondary | ICD-10-CM | POA: Diagnosis not present

## 2020-08-20 DIAGNOSIS — D509 Iron deficiency anemia, unspecified: Secondary | ICD-10-CM | POA: Diagnosis not present

## 2020-08-20 DIAGNOSIS — Z992 Dependence on renal dialysis: Secondary | ICD-10-CM | POA: Diagnosis not present

## 2020-08-20 DIAGNOSIS — N2581 Secondary hyperparathyroidism of renal origin: Secondary | ICD-10-CM | POA: Diagnosis not present

## 2020-08-20 DIAGNOSIS — D631 Anemia in chronic kidney disease: Secondary | ICD-10-CM | POA: Diagnosis not present

## 2020-08-23 DIAGNOSIS — Z992 Dependence on renal dialysis: Secondary | ICD-10-CM | POA: Diagnosis not present

## 2020-08-23 DIAGNOSIS — N2581 Secondary hyperparathyroidism of renal origin: Secondary | ICD-10-CM | POA: Diagnosis not present

## 2020-08-23 DIAGNOSIS — N186 End stage renal disease: Secondary | ICD-10-CM | POA: Diagnosis not present

## 2020-08-23 DIAGNOSIS — D631 Anemia in chronic kidney disease: Secondary | ICD-10-CM | POA: Diagnosis not present

## 2020-08-23 DIAGNOSIS — D509 Iron deficiency anemia, unspecified: Secondary | ICD-10-CM | POA: Diagnosis not present

## 2020-08-25 DIAGNOSIS — N186 End stage renal disease: Secondary | ICD-10-CM | POA: Diagnosis not present

## 2020-08-25 DIAGNOSIS — D631 Anemia in chronic kidney disease: Secondary | ICD-10-CM | POA: Diagnosis not present

## 2020-08-25 DIAGNOSIS — N2581 Secondary hyperparathyroidism of renal origin: Secondary | ICD-10-CM | POA: Diagnosis not present

## 2020-08-25 DIAGNOSIS — D509 Iron deficiency anemia, unspecified: Secondary | ICD-10-CM | POA: Diagnosis not present

## 2020-08-25 DIAGNOSIS — Z992 Dependence on renal dialysis: Secondary | ICD-10-CM | POA: Diagnosis not present

## 2020-08-27 DIAGNOSIS — Z992 Dependence on renal dialysis: Secondary | ICD-10-CM | POA: Diagnosis not present

## 2020-08-27 DIAGNOSIS — D509 Iron deficiency anemia, unspecified: Secondary | ICD-10-CM | POA: Diagnosis not present

## 2020-08-27 DIAGNOSIS — N2581 Secondary hyperparathyroidism of renal origin: Secondary | ICD-10-CM | POA: Diagnosis not present

## 2020-08-27 DIAGNOSIS — N186 End stage renal disease: Secondary | ICD-10-CM | POA: Diagnosis not present

## 2020-08-27 DIAGNOSIS — D631 Anemia in chronic kidney disease: Secondary | ICD-10-CM | POA: Diagnosis not present

## 2020-08-31 ENCOUNTER — Other Ambulatory Visit
Admission: RE | Admit: 2020-08-31 | Discharge: 2020-08-31 | Disposition: A | Discharge status: Home / Self Care | Payer: Medicare Other | Source: Ambulatory Visit | Attending: Vascular Surgery | Admitting: Vascular Surgery

## 2020-08-31 ENCOUNTER — Other Ambulatory Visit (INDEPENDENT_AMBULATORY_CARE_PROVIDER_SITE_OTHER): Payer: Self-pay | Admitting: Nurse Practitioner

## 2020-08-31 ENCOUNTER — Telehealth (INDEPENDENT_AMBULATORY_CARE_PROVIDER_SITE_OTHER): Payer: Self-pay

## 2020-08-31 ENCOUNTER — Other Ambulatory Visit: Payer: Self-pay

## 2020-08-31 DIAGNOSIS — Z20822 Contact with and (suspected) exposure to covid-19: Secondary | ICD-10-CM | POA: Diagnosis not present

## 2020-08-31 DIAGNOSIS — Z01812 Encounter for preprocedural laboratory examination: Secondary | ICD-10-CM | POA: Diagnosis not present

## 2020-08-31 LAB — SARS CORONAVIRUS 2 (TAT 6-24 HRS): SARS Coronavirus 2: NEGATIVE

## 2020-08-31 NOTE — Telephone Encounter (Signed)
Dawn at [address] faxed over an order for a declot for the patient.  Patient is scheduled with a Dr. Delana Meyer for a left arm declot on 09/01/20 with a 12:30 pm arrival time to the [facility]. Covid testing on 08/31/20 before 11:00 am at the [facility]. Pre-procedure instructions will be faxed to [facility] at [facility] [address].

## 2020-09-01 ENCOUNTER — Encounter
Admission: RE | Disposition: A | Discharge status: Home / Self Care | Payer: Self-pay | Source: Home / Self Care | Attending: Vascular Surgery

## 2020-09-01 ENCOUNTER — Other Ambulatory Visit: Payer: Self-pay

## 2020-09-01 ENCOUNTER — Ambulatory Visit
Admission: RE | Admit: 2020-09-01 | Discharge: 2020-09-01 | Disposition: A | Discharge status: Home / Self Care | Payer: Medicare Other | Attending: Vascular Surgery | Admitting: Vascular Surgery

## 2020-09-01 ENCOUNTER — Encounter: Payer: Self-pay | Admitting: Vascular Surgery

## 2020-09-01 DIAGNOSIS — N186 End stage renal disease: Secondary | ICD-10-CM | POA: Insufficient documentation

## 2020-09-01 DIAGNOSIS — F17290 Nicotine dependence, other tobacco product, uncomplicated: Secondary | ICD-10-CM | POA: Diagnosis not present

## 2020-09-01 DIAGNOSIS — Z794 Long term (current) use of insulin: Secondary | ICD-10-CM | POA: Insufficient documentation

## 2020-09-01 DIAGNOSIS — I120 Hypertensive chronic kidney disease with stage 5 chronic kidney disease or end stage renal disease: Secondary | ICD-10-CM | POA: Diagnosis not present

## 2020-09-01 DIAGNOSIS — Y841 Kidney dialysis as the cause of abnormal reaction of the patient, or of later complication, without mention of misadventure at the time of the procedure: Secondary | ICD-10-CM | POA: Diagnosis not present

## 2020-09-01 DIAGNOSIS — E1122 Type 2 diabetes mellitus with diabetic chronic kidney disease: Secondary | ICD-10-CM | POA: Insufficient documentation

## 2020-09-01 DIAGNOSIS — T82858A Stenosis of vascular prosthetic devices, implants and grafts, initial encounter: Secondary | ICD-10-CM | POA: Insufficient documentation

## 2020-09-01 DIAGNOSIS — Z79899 Other long term (current) drug therapy: Secondary | ICD-10-CM | POA: Insufficient documentation

## 2020-09-01 DIAGNOSIS — Z992 Dependence on renal dialysis: Secondary | ICD-10-CM | POA: Diagnosis not present

## 2020-09-01 DIAGNOSIS — T82898A Other specified complication of vascular prosthetic devices, implants and grafts, initial encounter: Secondary | ICD-10-CM | POA: Diagnosis not present

## 2020-09-01 HISTORY — PX: PERIPHERAL VASCULAR THROMBECTOMY: CATH118306

## 2020-09-01 LAB — GLUCOSE, CAPILLARY
Glucose-Capillary: 96 mg/dL (ref 70–99)
Glucose-Capillary: 66 mg/dL — ABNORMAL LOW (ref 70–99)
Glucose-Capillary: 71 mg/dL (ref 70–99)

## 2020-09-01 LAB — POTASSIUM (ARMC VASCULAR LAB ONLY): Potassium (ARMC vascular lab): 4.1 (ref 3.5–5.1)

## 2020-09-01 SURGERY — PERIPHERAL VASCULAR THROMBECTOMY
Anesthesia: Moderate Sedation | Laterality: Left

## 2020-09-01 MED ORDER — FENTANYL CITRATE (PF) 100 MCG/2ML IJ SOLN
INTRAMUSCULAR | Status: AC
  Filled 2020-09-01: qty 2

## 2020-09-01 MED ORDER — CEFAZOLIN SODIUM-DEXTROSE 1-4 GM/50ML-% IV SOLN
1.0000 g | Freq: Once | INTRAVENOUS | Status: AC
  Filled 2020-09-01: qty 50

## 2020-09-01 MED ORDER — HYDRALAZINE HCL 20 MG/ML IJ SOLN: 10.0000 mg | Freq: Once | INTRAMUSCULAR | Status: AC

## 2020-09-01 MED ORDER — MIDAZOLAM HCL 5 MG/5ML IJ SOLN
INTRAMUSCULAR | Status: AC
  Filled 2020-09-01: qty 5

## 2020-09-01 MED ORDER — FENTANYL CITRATE (PF) 100 MCG/2ML IJ SOLN
INTRAMUSCULAR | Status: DC | PRN
  Administered 2020-09-01 (×2): 25 ug via INTRAVENOUS
  Administered 2020-09-01: 50 ug via INTRAVENOUS

## 2020-09-01 MED ORDER — HEPARIN SODIUM (PORCINE) 1000 UNIT/ML IJ SOLN
INTRAMUSCULAR | Status: AC
  Filled 2020-09-01: qty 1

## 2020-09-01 MED ORDER — CEFAZOLIN SODIUM-DEXTROSE 1-4 GM/50ML-% IV SOLN
INTRAVENOUS | Status: AC
  Administered 2020-09-01: 1 g via INTRAVENOUS
  Filled 2020-09-01: qty 50

## 2020-09-01 MED ORDER — MIDAZOLAM HCL 2 MG/ML PO SYRP: 8.0000 mg | ORAL_SOLUTION | Freq: Once | ORAL | Status: DC | PRN

## 2020-09-01 MED ORDER — HEPARIN SODIUM (PORCINE) 1000 UNIT/ML IJ SOLN
INTRAMUSCULAR | Status: DC | PRN
  Administered 2020-09-01: 4000 [IU] via INTRAVENOUS

## 2020-09-01 MED ORDER — FAMOTIDINE 20 MG PO TABS: 40.0000 mg | ORAL_TABLET | Freq: Once | ORAL | Status: DC | PRN

## 2020-09-01 MED ORDER — MIDAZOLAM HCL 2 MG/2ML IJ SOLN
INTRAMUSCULAR | Status: DC | PRN
  Administered 2020-09-01: 2 mg via INTRAVENOUS
  Administered 2020-09-01 (×2): 1 mg via INTRAVENOUS

## 2020-09-01 MED ORDER — HYDRALAZINE HCL 20 MG/ML IJ SOLN
INTRAMUSCULAR | Status: AC
  Administered 2020-09-01: 10 mg via INTRAVENOUS
  Filled 2020-09-01: qty 1

## 2020-09-01 MED ORDER — METHYLPREDNISOLONE SODIUM SUCC 125 MG IJ SOLR: 125.0000 mg | Freq: Once | INTRAMUSCULAR | Status: DC | PRN

## 2020-09-01 MED ORDER — AMLODIPINE BESYLATE 5 MG PO TABS: 5.0000 mg | ORAL_TABLET | Freq: Every day | ORAL | Status: DC

## 2020-09-01 MED ORDER — HYDROMORPHONE HCL 1 MG/ML IJ SOLN: 1.0000 mg | Freq: Once | INTRAMUSCULAR | Status: DC | PRN

## 2020-09-01 MED ORDER — ONDANSETRON HCL 4 MG/2ML IJ SOLN: 4.0000 mg | Freq: Four times a day (QID) | INTRAMUSCULAR | Status: DC | PRN

## 2020-09-01 MED ORDER — SODIUM CHLORIDE 0.9 % IV SOLN: INTRAVENOUS | Status: DC

## 2020-09-01 MED ORDER — LOSARTAN POTASSIUM 50 MG PO TABS: 100.0000 mg | ORAL_TABLET | Freq: Every day | ORAL | Status: DC

## 2020-09-01 MED ORDER — DIPHENHYDRAMINE HCL 50 MG/ML IJ SOLN: 50.0000 mg | Freq: Once | INTRAMUSCULAR | Status: DC | PRN

## 2020-09-01 SURGICAL SUPPLY — 13 items
BALLN DORADO 6X40X80 (BALLOONS) ×2
BALLN DORADO 8X40X80 (BALLOONS) ×2
BALLN LUTONIX AV 8X60X75 (BALLOONS) ×2
BALLOON DORADO 6X40X80 (BALLOONS) ×1 IMPLANT
BALLOON DORADO 8X40X80 (BALLOONS) ×1 IMPLANT
BALLOON LUTONIX AV 8X60X75 (BALLOONS) ×1 IMPLANT
CANNULA 5F STIFF (CANNULA) ×2 IMPLANT
KIT ENCORE 26 ADVANTAGE (KITS) ×2 IMPLANT
PACK ANGIOGRAPHY (CUSTOM PROCEDURE TRAY) ×2 IMPLANT
SHEATH BRITE TIP 6FRX5.5 (SHEATH) ×2 IMPLANT
SHEATH BRITE TIP 7FRX5.5 (SHEATH) ×2 IMPLANT
SUT MNCRL AB 4-0 PS2 18 (SUTURE) ×2 IMPLANT
WIRE G V18X300CM (WIRE) ×2 IMPLANT

## 2020-09-01 NOTE — Interval H&P Note (Signed)
History and Physical Interval Note:  09/01/2020 1:11 PM  Timothy Gibson  has presented today for surgery, with the diagnosis of LT arm declot   End Stage Renal Covid  Feb 22.  The various methods of treatment have been discussed with the patient and family. After consideration of risks, benefits and other options for treatment, the patient has consented to  Procedure(s): PERIPHERAL VASCULAR THROMBECTOMY (Left) as a surgical intervention.  The patient's history has been reviewed, patient examined, no change in status, stable for surgery.  I have reviewed the patient's chart and labs.  Questions were answered to the patient's satisfaction.     Hortencia Pilar

## 2020-09-01 NOTE — Progress Notes (Signed)
Dr. Delana Meyer at bedside, speaking with pt. Re: procedural results. Pt. Verbalizes understanding of conversation.

## 2020-09-01 NOTE — Op Note (Signed)
OPERATIVE NOTE   PROCEDURE: 1. Contrast injection left forearm AV access 2. Percutaneous transluminal angioplasty and stent placement left forearm AV fistula  PRE-OPERATIVE DIAGNOSIS: Complication of dialysis access                                                       End Stage Renal Disease  POST-OPERATIVE DIAGNOSIS: same as above   SURGEON: Timothy Gibson, M.D.  ANESTHESIA: Conscious sedation was administered under my direct supervision by the interventional radiology RN. IV Versed plus fentanyl were utilized. Continuous ECG, pulse oximetry and blood pressure was monitored throughout the entire procedure.  Conscious sedation was for a total of 33 minutes.  ESTIMATED BLOOD LOSS: minimal  FINDING(S): 1. Greater than 90% narrowing at the leading edge of the previously placed stent extending through the stented segment  SPECIMEN(S):  None  CONTRAST: 35 cc  FLUOROSCOPY TIME: Two minutes  INDICATIONS: Timothy Gibson is a 70 y.o. male who  presents with malfunctioning left forearm AV access.  The patient is scheduled for angiography with possible intervention of the AV access.  The patient is aware the risks include but are not limited to: bleeding, infection, thrombosis of the cannulated access, and possible anaphylactic reaction to the contrast.  The patient acknowledges if the access can not be salvaged a tunneled catheter will be needed and will be placed during this procedure.  The patient is aware of the risks of the procedure and elects to proceed with the angiogram and intervention.  DESCRIPTION: After full informed written consent was obtained, the patient was brought back to the Special Procedure suite and placed supine position.  Appropriate cardiopulmonary monitors were placed.  The left arm was prepped and draped in the standard fashion.  Appropriate timeout is called. The left radiocephalic fistula was cannulated with a micropuncture needle.  Cannulation was performed  with ultrasound guidance. Ultrasound was placed in a sterile sleeve, the AV access was interrogated and noted to be echolucent and compressible indicating patency. Image was recorded for the permanent record. The puncture is performed under continuous ultrasound visualization.   The microwire was advanced and the needle was exchanged for  a microsheath.  The J-wire was then advanced and a 6 Fr sheath inserted.  Hand injections were completed to image the access from the arterial anastomosis through the entire access.  The central venous structures were also imaged by hand injections.  Based on the images,  3000 units of heparin was given and a wire was negotiated through the strictures within the venous portion of the graft.  A 6 mm x 40 mm Dorado balloon was then advanced across the stenosis and insufflated to 18 atm for 1 minute.  Next an 8 mm x 50 mm Viabahn was deployed across the stenoses and postdilated with an 8 mm Dorado balloon to 14 atm and subsequently an 8 mm x 60 mm Lutonix balloon was advanced extending it beyond the stent in both proximally and distally..  Follow-up imaging demonstrates complete resolution of the stricture with rapid flow of contrast through the graft, the central venous anatomy is preserved.  A 4-0 Monocryl purse-string suture was sewn around the sheath.  The sheath was removed and light pressure was applied.  A sterile bandage was applied to the puncture site.   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  More proximally in the forearm there is a previously placed stent noted that appears to be a Viabahn approximately 50 mm in length.  There is greater than 90% stenosis noted at the leading edge there is associated diffuse greater than 70% disease throughout the stented segment.  More proximal to the stent the vein is widely patent.  At the antecubital fossa there is a bridging vein  that then fills the cephalic which is quite large.  The basilic also fills from this antecubital crossing vein but it is much smaller.  Axillary subclavian innominate and superior vena cava are widely patent.  Following angioplasty and stent placement there is less then 5% residual stenosis.   COMPLICATIONS: None  CONDITION: Timothy Gibson, M.D [facility] Office: [phone]  09/01/2020 4:09 PM

## 2020-09-01 NOTE — H&P (Signed)
@LOGO @   MRN : [id number]  Timothy Gibson is a 70 y.o. ([birth year]) male who presents with chief complaint of problems with my fistula.  History of Present Illness: The patient is a 70 year old gentleman who was sent by [facility] for evaluation of problems with his fistula.  On Monday they had difficulty with cannulation and bleeding from the arterial cannulation site.  The patient notes a significant increase in bleeding time after decannulation typically from the arterial site only.  The patient has also been informed that there is increased recirculation.    The patient denies hand pain or other symptoms consistent with steal phenomena.  No significant arm swelling.  The patient denies redness or swelling at the access site. The patient denies fever or chills at home or while on dialysis.  The patient denies amaurosis fugax or recent TIA symptoms. There are no recent neurological changes noted. The patient denies claudication symptoms or rest pain symptoms. The patient denies history of DVT, PE or superficial thrombophlebitis. The patient denies recent episodes of angina or shortness of breath.      Current Meds  Medication Sig  . albuterol (VENTOLIN HFA) 108 (90 Base) MCG/ACT inhaler Inhale 2 puffs into the lungs every 6 (six) hours as needed for wheezing or shortness of breath.  Marland Kitchen amLODipine (NORVASC) 5 MG tablet TAKE 1 TABLET(5 MG) BY MOUTH DAILY (Patient taking differently: Take 5 mg by mouth daily.)  . insulin degludec (TRESIBA FLEXTOUCH) 100 UNIT/ML FlexTouch Pen INJECT 10 UNITS TOTAL INTO THE SKIN AT BEDTIME (Patient taking differently: Inject into the skin at bedtime.)  . losartan (COZAAR) 100 MG tablet TAKE 1 TABLET(100 MG) BY MOUTH DAILY (Patient taking differently: Take 100 mg by mouth daily.)  . sevelamer carbonate (RENVELA) 800 MG tablet Take 2,400-3,200 mg by mouth 3 (three) times daily with meals.  Marland Kitchen umeclidinium-vilanterol (ANORO ELLIPTA) 62.5-25 MCG/INH AEPB Inhale 1 puff  into the lungs daily.  . urea (CARMOL) 40 % ointment Apply 1 application topically daily as needed (Dead skin).    Past Medical History:  Diagnosis Date  . Cancer ([place])    right kidney  . Diabetes mellitus without complication ([place])   . Dialysis patient [facility])    on Mon-Wed-Fri dialysis  . ESRD (end stage renal disease) ([place])   . History of gangrene   . Hypertension     Past Surgical History:  Procedure Laterality Date  . A/V FISTULAGRAM Left 06/04/2018   Procedure: A/V FISTULAGRAM;  Surgeon: Katha Cabal, MD;  Location: [place] [facility];  Service: Cardiovascular;  Laterality: Left;  . COLONOSCOPY WITH PROPOFOL N/A 12/16/2015   Procedure: COLONOSCOPY WITH PROPOFOL;  Surgeon: Manya Silvas, MD;  Location: [facility] ENDOSCOPY;  Service: Endoscopy;  Laterality: N/A;  . KNEE SURGERY    . left forearm AV fistula    . LOWER EXTREMITY ANGIOGRAPHY Left 07/24/2019   Procedure: LOWER EXTREMITY ANGIOGRAPHY;  Surgeon: Algernon Huxley, MD;  Location: [place] [facility];  Service: Cardiovascular;  Laterality: Left;  . NEPHRECTOMY      Social History Social History   Tobacco Use  . Smoking status: Current Some Day Smoker    Types: Cigars  . Smokeless tobacco: Former Systems developer  . Tobacco comment: cigar smoker-hasnt smoked in 5 mos.   Vaping Use  . Vaping Use: Never used  Substance Use Topics  . Alcohol use: Not Currently  . Drug use: Never    Family History Family History  Problem Relation Age of Onset  .  Hypertension Father   . Colon cancer Father   . Hypertension Mother   . Parkinson's disease Mother   . Diabetes Mother   . Alzheimer's disease Mother   No family history of bleeding/clotting disorders, porphyria or autoimmune disease  Allergies  Allergen Reactions  . Adhesive [Tape]     Pulls skin off.  Must use paper tape     REVIEW OF SYSTEMS (Negative unless checked)  Constitutional: [] Weight loss  [] Fever  [] Chills Cardiac: [] Chest pain   [] Chest pressure    [] Palpitations   [] Shortness of breath when laying flat   [] Shortness of breath with exertion. Vascular:  [] Pain in legs with walking   [] Pain in legs at rest  [] History of DVT   [] Phlebitis   [] Swelling in legs   [] Varicose veins   [] Non-healing ulcers Pulmonary:   [] Uses home oxygen   [] Productive cough   [] Hemoptysis   [] Wheeze  [] COPD   [] Asthma Neurologic:  [] Dizziness   [] Seizures   [] History of stroke   [] History of TIA  [] Aphasia   [] Vissual changes   [] Weakness or numbness in arm   [] Weakness or numbness in leg Musculoskeletal:   [] Joint swelling   [] Joint pain   [] Low back pain Hematologic:  [] Easy bruising  [] Easy bleeding   [] Hypercoagulable state   [] Anemic Gastrointestinal:  [] Diarrhea   [] Vomiting  [] Gastroesophageal reflux/heartburn   [] Difficulty swallowing. Genitourinary:  [x] Chronic kidney disease   [] Difficult urination  [] Frequent urination   [] Blood in urine Skin:  [] Rashes   [] Ulcers  Psychological:  [] History of anxiety   []  History of major depression.  Physical Examination  Vitals:   09/01/20 1245 09/01/20 1253 09/01/20 1300  BP:  (!) 200/101 (!) 192/92  Pulse: 97 88   Resp: (!) 34 18 14  Temp:  97.8 F (36.6 C)   TempSrc:  Oral   SpO2: 96% 100%   Weight:  112 kg   Height:  6' 4.5" (1.943 m)    Body mass index is 29.67 kg/m. Gen: WD/WN, NAD Head: [place]/AT, No temporalis wasting.  Ear/Nose/Throat: Hearing grossly intact, nares w/o erythema or drainage Eyes: PER, EOMI, sclera nonicteric.  Neck: Supple, no large masses.   Pulmonary:  Good air movement, no audible wheezing bilaterally, no use of accessory muscles.  Cardiac: RRR, no JVD Vascular: Left forearm fistula has an excellent thrill and bruit near the arterial.  It then becomes somewhat aneurysmal and this is the cannulation site.  There is a poor thrill noted in this area.  Proximal to this region the antecubital crossing vein is soft and quite large measuring almost 10 mm in diameter and appears to fill a  cephalic vein. Vessel Right Left  Radial Palpable Palpable  Brachial Palpable Palpable  Carotid Palpable Palpable  Gastrointestinal: Non-distended. No guarding/no peritoneal signs.  Musculoskeletal: M/S 5/5 throughout.  No deformity or atrophy.  Neurologic: CN 2-12 intact. Symmetrical.  Speech is fluent. Motor exam as listed above. Psychiatric: Judgment intact, Mood & affect appropriate for pt's clinical situation. Dermatologic: No rashes or ulcers noted.  No changes consistent with cellulitis.   CBC Lab Results  Component Value Date   WBC 3.7 05/10/2020   HGB 9.9 (L) 05/10/2020   HCT 30.3 (L) 05/10/2020   MCV 97 05/10/2020   PLT 127 (L) 05/10/2020    BMET    Component Value Date/Time   NA 142 05/10/2020 1446   NA 138 08/13/2011 1058   K 4.0 05/10/2020 1446   K 4.3 08/13/2011 1058  CL 97 05/10/2020 1446   CL 96 (L) 08/13/2011 1058   CO2 31 (H) 05/10/2020 1446   CO2 29 08/13/2011 1058   GLUCOSE 182 (H) 05/10/2020 1446   GLUCOSE 308 (H) 05/23/2017 0008   GLUCOSE 294 (H) 08/13/2011 1058   BUN 23 05/10/2020 1446   BUN 49 (H) 08/13/2011 1058   CREATININE 7.63 (H) 05/10/2020 1446   CREATININE 8.72 (H) 08/13/2011 1058   CALCIUM 8.7 05/10/2020 1446   CALCIUM 9.0 08/13/2011 1058   GFRNONAA 7 (L) 05/10/2020 1446   GFRNONAA 7 (L) 08/13/2011 1058   GFRAA 8 (L) 05/10/2020 1446   GFRAA 8 (L) 08/13/2011 1058   CrCl cannot be calculated (Patient's most recent lab result is older than the maximum 21 days allowed.).  COAG Lab Results  Component Value Date   INR 1.43 03/20/2017    Radiology No results found.   Assessment/Plan 1.  Complication dialysis device with thrombosis AV access:  Patient's left forearm fistula dialysis access is malfunctioning. The patient will undergo angiography with hope for repair using interventional techniques. Potassium will be drawn to ensure that it is an appropriate level prior to performing thrombectomy. 2.  End-stage renal disease  requiring hemodialysis:  Patient will continue dialysis therapy without further interruption if a successful thrombectomy is not achieved then catheter will be placed. Dialysis has already been arranged since the patient missed their previous session 3.  Hypertension:  Patient will continue medical management; nephrology is following no changes in oral medications. 4. Diabetes mellitus:  Glucose will be monitored and oral medications been held this morning once the patient has undergone the patient's procedure po intake will be reinitiated and again Accu-Cheks will be used to assess the blood glucose level and treat as needed. The patient will be restarted on the patient's usual hypoglycemic regime     Hortencia Pilar, MD  09/01/2020 1:02 PM

## 2020-09-02 ENCOUNTER — Encounter: Payer: Self-pay | Admitting: Vascular Surgery

## 2020-09-02 DIAGNOSIS — Z992 Dependence on renal dialysis: Secondary | ICD-10-CM | POA: Diagnosis not present

## 2020-09-02 DIAGNOSIS — N186 End stage renal disease: Secondary | ICD-10-CM | POA: Diagnosis not present

## 2020-09-02 DIAGNOSIS — D631 Anemia in chronic kidney disease: Secondary | ICD-10-CM | POA: Diagnosis not present

## 2020-09-02 DIAGNOSIS — N2581 Secondary hyperparathyroidism of renal origin: Secondary | ICD-10-CM | POA: Diagnosis not present

## 2020-09-02 DIAGNOSIS — D509 Iron deficiency anemia, unspecified: Secondary | ICD-10-CM | POA: Diagnosis not present

## 2020-09-03 DIAGNOSIS — N2581 Secondary hyperparathyroidism of renal origin: Secondary | ICD-10-CM | POA: Diagnosis not present

## 2020-09-03 DIAGNOSIS — Z992 Dependence on renal dialysis: Secondary | ICD-10-CM | POA: Diagnosis not present

## 2020-09-03 DIAGNOSIS — D631 Anemia in chronic kidney disease: Secondary | ICD-10-CM | POA: Diagnosis not present

## 2020-09-03 DIAGNOSIS — N186 End stage renal disease: Secondary | ICD-10-CM | POA: Diagnosis not present

## 2020-09-03 DIAGNOSIS — D509 Iron deficiency anemia, unspecified: Secondary | ICD-10-CM | POA: Diagnosis not present

## 2020-09-06 DIAGNOSIS — N2581 Secondary hyperparathyroidism of renal origin: Secondary | ICD-10-CM | POA: Diagnosis not present

## 2020-09-06 DIAGNOSIS — D631 Anemia in chronic kidney disease: Secondary | ICD-10-CM | POA: Diagnosis not present

## 2020-09-06 DIAGNOSIS — D509 Iron deficiency anemia, unspecified: Secondary | ICD-10-CM | POA: Diagnosis not present

## 2020-09-06 DIAGNOSIS — Z992 Dependence on renal dialysis: Secondary | ICD-10-CM | POA: Diagnosis not present

## 2020-09-06 DIAGNOSIS — N186 End stage renal disease: Secondary | ICD-10-CM | POA: Diagnosis not present

## 2020-09-08 DIAGNOSIS — D631 Anemia in chronic kidney disease: Secondary | ICD-10-CM | POA: Diagnosis not present

## 2020-09-08 DIAGNOSIS — Z992 Dependence on renal dialysis: Secondary | ICD-10-CM | POA: Diagnosis not present

## 2020-09-08 DIAGNOSIS — N2581 Secondary hyperparathyroidism of renal origin: Secondary | ICD-10-CM | POA: Diagnosis not present

## 2020-09-08 DIAGNOSIS — N186 End stage renal disease: Secondary | ICD-10-CM | POA: Diagnosis not present

## 2020-09-08 DIAGNOSIS — D509 Iron deficiency anemia, unspecified: Secondary | ICD-10-CM | POA: Diagnosis not present

## 2020-09-10 DIAGNOSIS — D509 Iron deficiency anemia, unspecified: Secondary | ICD-10-CM | POA: Diagnosis not present

## 2020-09-10 DIAGNOSIS — Z992 Dependence on renal dialysis: Secondary | ICD-10-CM | POA: Diagnosis not present

## 2020-09-10 DIAGNOSIS — N186 End stage renal disease: Secondary | ICD-10-CM | POA: Diagnosis not present

## 2020-09-10 DIAGNOSIS — D631 Anemia in chronic kidney disease: Secondary | ICD-10-CM | POA: Diagnosis not present

## 2020-09-10 DIAGNOSIS — N2581 Secondary hyperparathyroidism of renal origin: Secondary | ICD-10-CM | POA: Diagnosis not present

## 2020-09-13 DIAGNOSIS — N2581 Secondary hyperparathyroidism of renal origin: Secondary | ICD-10-CM | POA: Diagnosis not present

## 2020-09-13 DIAGNOSIS — N186 End stage renal disease: Secondary | ICD-10-CM | POA: Diagnosis not present

## 2020-09-13 DIAGNOSIS — Z992 Dependence on renal dialysis: Secondary | ICD-10-CM | POA: Diagnosis not present

## 2020-09-13 DIAGNOSIS — D631 Anemia in chronic kidney disease: Secondary | ICD-10-CM | POA: Diagnosis not present

## 2020-09-13 DIAGNOSIS — D509 Iron deficiency anemia, unspecified: Secondary | ICD-10-CM | POA: Diagnosis not present

## 2020-09-15 DIAGNOSIS — D631 Anemia in chronic kidney disease: Secondary | ICD-10-CM | POA: Diagnosis not present

## 2020-09-15 DIAGNOSIS — N2581 Secondary hyperparathyroidism of renal origin: Secondary | ICD-10-CM | POA: Diagnosis not present

## 2020-09-15 DIAGNOSIS — D509 Iron deficiency anemia, unspecified: Secondary | ICD-10-CM | POA: Diagnosis not present

## 2020-09-15 DIAGNOSIS — N186 End stage renal disease: Secondary | ICD-10-CM | POA: Diagnosis not present

## 2020-09-15 DIAGNOSIS — Z992 Dependence on renal dialysis: Secondary | ICD-10-CM | POA: Diagnosis not present

## 2020-09-17 DIAGNOSIS — N2581 Secondary hyperparathyroidism of renal origin: Secondary | ICD-10-CM | POA: Diagnosis not present

## 2020-09-17 DIAGNOSIS — D509 Iron deficiency anemia, unspecified: Secondary | ICD-10-CM | POA: Diagnosis not present

## 2020-09-17 DIAGNOSIS — N186 End stage renal disease: Secondary | ICD-10-CM | POA: Diagnosis not present

## 2020-09-17 DIAGNOSIS — D631 Anemia in chronic kidney disease: Secondary | ICD-10-CM | POA: Diagnosis not present

## 2020-09-17 DIAGNOSIS — Z992 Dependence on renal dialysis: Secondary | ICD-10-CM | POA: Diagnosis not present

## 2020-09-20 DIAGNOSIS — N2581 Secondary hyperparathyroidism of renal origin: Secondary | ICD-10-CM | POA: Diagnosis not present

## 2020-09-20 DIAGNOSIS — Z992 Dependence on renal dialysis: Secondary | ICD-10-CM | POA: Diagnosis not present

## 2020-09-20 DIAGNOSIS — N186 End stage renal disease: Secondary | ICD-10-CM | POA: Diagnosis not present

## 2020-09-20 DIAGNOSIS — D509 Iron deficiency anemia, unspecified: Secondary | ICD-10-CM | POA: Diagnosis not present

## 2020-09-20 DIAGNOSIS — D631 Anemia in chronic kidney disease: Secondary | ICD-10-CM | POA: Diagnosis not present

## 2020-09-22 DIAGNOSIS — N186 End stage renal disease: Secondary | ICD-10-CM | POA: Diagnosis not present

## 2020-09-22 DIAGNOSIS — N2581 Secondary hyperparathyroidism of renal origin: Secondary | ICD-10-CM | POA: Diagnosis not present

## 2020-09-22 DIAGNOSIS — Z992 Dependence on renal dialysis: Secondary | ICD-10-CM | POA: Diagnosis not present

## 2020-09-22 DIAGNOSIS — D509 Iron deficiency anemia, unspecified: Secondary | ICD-10-CM | POA: Diagnosis not present

## 2020-09-22 DIAGNOSIS — D631 Anemia in chronic kidney disease: Secondary | ICD-10-CM | POA: Diagnosis not present

## 2020-09-24 DIAGNOSIS — N2581 Secondary hyperparathyroidism of renal origin: Secondary | ICD-10-CM | POA: Diagnosis not present

## 2020-09-24 DIAGNOSIS — D631 Anemia in chronic kidney disease: Secondary | ICD-10-CM | POA: Diagnosis not present

## 2020-09-24 DIAGNOSIS — D509 Iron deficiency anemia, unspecified: Secondary | ICD-10-CM | POA: Diagnosis not present

## 2020-09-24 DIAGNOSIS — N186 End stage renal disease: Secondary | ICD-10-CM | POA: Diagnosis not present

## 2020-09-24 DIAGNOSIS — Z992 Dependence on renal dialysis: Secondary | ICD-10-CM | POA: Diagnosis not present

## 2020-09-27 ENCOUNTER — Other Ambulatory Visit (INDEPENDENT_AMBULATORY_CARE_PROVIDER_SITE_OTHER): Payer: Self-pay | Admitting: Vascular Surgery

## 2020-09-27 DIAGNOSIS — N186 End stage renal disease: Secondary | ICD-10-CM

## 2020-09-27 DIAGNOSIS — Z992 Dependence on renal dialysis: Secondary | ICD-10-CM | POA: Diagnosis not present

## 2020-09-27 DIAGNOSIS — D631 Anemia in chronic kidney disease: Secondary | ICD-10-CM | POA: Diagnosis not present

## 2020-09-27 DIAGNOSIS — D509 Iron deficiency anemia, unspecified: Secondary | ICD-10-CM | POA: Diagnosis not present

## 2020-09-27 DIAGNOSIS — N2581 Secondary hyperparathyroidism of renal origin: Secondary | ICD-10-CM | POA: Diagnosis not present

## 2020-09-29 DIAGNOSIS — D509 Iron deficiency anemia, unspecified: Secondary | ICD-10-CM | POA: Diagnosis not present

## 2020-09-29 DIAGNOSIS — Z992 Dependence on renal dialysis: Secondary | ICD-10-CM | POA: Diagnosis not present

## 2020-09-29 DIAGNOSIS — N2581 Secondary hyperparathyroidism of renal origin: Secondary | ICD-10-CM | POA: Diagnosis not present

## 2020-09-29 DIAGNOSIS — D631 Anemia in chronic kidney disease: Secondary | ICD-10-CM | POA: Diagnosis not present

## 2020-09-29 DIAGNOSIS — N186 End stage renal disease: Secondary | ICD-10-CM | POA: Diagnosis not present

## 2020-09-29 NOTE — Progress Notes (Signed)
MRN : [id number]  Timothy Gibson is a 70 y.o. (26-Oct-1950) male who presents with chief complaint of No chief complaint on file. Marland Kitchen  History of Present Illness:  The patient returns to the office for followup status post intervention of the dialysis access 09/01/2020.   1. Percutaneous transluminal angioplasty and stent placement left forearm AV fistula  Following the intervention the access function has significantly improved, with better flow rates and improved KT/V. The patient has not been experiencing increased bleeding times following decannulation and the patient denies increased recirculation. The patient denies an increase in arm swelling. At the present time the patient denies hand pain.  The patient denies amaurosis fugax or recent TIA symptoms. There are no recent neurological changes noted. The patient denies claudication symptoms or rest pain symptoms. The patient denies history of DVT, PE or superficial thrombophlebitis. The patient denies recent episodes of angina or shortness of breath.   HDA obtained today shows the flow volume is about the same.  No outpatient medications have been marked as taking for the 09/30/20 encounter (Appointment) with Delana Meyer, Dolores Lory, MD.    Past Medical History:  Diagnosis Date  . 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 Diabetes mellitus without complication ([place])   . Dialysis patient [facility])    on Mon-Wed-Fri dialysis  . ESRD (end stage renal disease) ([place])   . History of gangrene   . Hypertension     Past Surgical History:  Procedure Laterality Date  . A/V FISTULAGRAM Left 06/04/2018   Procedure: A/V FISTULAGRAM;  Surgeon: Katha Cabal, MD;  Location: [place] [facility];  Service: Cardiovascular;  Laterality: Left;  . COLONOSCOPY WITH PROPOFOL N/A 12/16/2015   Procedure: COLONOSCOPY WITH PROPOFOL;  Surgeon: Manya Silvas, MD;  Location: [facility] ENDOSCOPY;  Service: Endoscopy;  Laterality: N/A;  . KNEE SURGERY    . left forearm  AV fistula    . LOWER EXTREMITY ANGIOGRAPHY Left 07/24/2019   Procedure: LOWER EXTREMITY ANGIOGRAPHY;  Surgeon: Algernon Huxley, MD;  Location: [place] [facility];  Service: Cardiovascular;  Laterality: Left;  . NEPHRECTOMY    . PERIPHERAL VASCULAR THROMBECTOMY Left 09/01/2020   Procedure: PERIPHERAL VASCULAR THROMBECTOMY;  Surgeon: Katha Cabal, MD;  Location: [place] [facility];  Service: Cardiovascular;  Laterality: Left;    Social History Social History   Tobacco Use  . Smoking status: Current Some Day Smoker    Types: Cigars  . Smokeless tobacco: Former Systems developer  . Tobacco comment: cigar smoker-hasnt smoked in 5 mos.   Vaping Use  . Vaping Use: Never used  Substance Use Topics  . Alcohol use: Not Currently  . Drug use: Never    Family History Family History  Problem Relation Age of Onset  . Hypertension Father   . Colon cancer Father   . Hypertension Mother   . Parkinson's disease Mother   . Diabetes Mother   . Alzheimer's disease Mother     Allergies  Allergen Reactions  . Adhesive [Tape]     Pulls skin off.  Must use paper tape     REVIEW OF SYSTEMS (Negative unless checked)  Constitutional: [] Weight loss  [] Fever  [] Chills Cardiac: [] Chest pain   [] Chest pressure   [] Palpitations   [] Shortness of breath when laying flat   [] Shortness of breath with exertion. Vascular:  [] Pain in legs with walking   [] Pain in legs at rest  [] History of DVT   [] Phlebitis   [] Swelling in  legs   [] Varicose veins   [] Non-healing ulcers Pulmonary:   [] Uses home oxygen   [] Productive cough   [] Hemoptysis   [] Wheeze  [] COPD   [] Asthma Neurologic:  [] Dizziness   [] Seizures   [] History of stroke   [] History of TIA  [] Aphasia   [] Vissual changes   [] Weakness or numbness in arm   [] Weakness or numbness in leg Musculoskeletal:   [] Joint swelling   [] Joint pain   [] Low back pain Hematologic:  [] Easy bruising  [] Easy bleeding   [] Hypercoagulable state   [] Anemic Gastrointestinal:   [] Diarrhea   [] Vomiting  [] Gastroesophageal reflux/heartburn   [] Difficulty swallowing. Genitourinary:  [x] Chronic kidney disease   [] Difficult urination  [] Frequent urination   [] Blood in urine Skin:  [] Rashes   [] Ulcers  Psychological:  [] History of anxiety   []  History of major depression.  Physical Examination  There were no vitals filed for this visit. There is no height or weight on file to calculate BMI. Gen: WD/WN, NAD Head: [place]/AT, No temporalis wasting.  Ear/Nose/Throat: Hearing grossly intact, nares w/o erythema or drainage Eyes: PER, EOMI, sclera nonicteric.  Neck: Supple, no large masses.   Pulmonary:  Good air movement, no audible wheezing bilaterally, no use of accessory muscles.  Cardiac: RRR, no JVD Vascular: left forearm AV fistula with good thrill and good bruit Vessel Right Left  Radial Palpable Palpable  Brachial Palpable Palpable  Carotid Palpable Palpable  Gastrointestinal: Non-distended. No guarding/no peritoneal signs.  Musculoskeletal: M/S 5/5 throughout.  No deformity or atrophy.  Neurologic: CN 2-12 intact. Symmetrical.  Speech is fluent. Motor exam as listed above. Psychiatric: Judgment intact, Mood & affect appropriate for pt's clinical situation. Dermatologic: No rashes or ulcers noted.  No changes consistent with cellulitis. Lymph : No lichenification or skin changes of chronic lymphedema.  CBC Lab Results  Component Value Date   WBC 3.7 05/10/2020   HGB 9.9 (L) 05/10/2020   HCT 30.3 (L) 05/10/2020   MCV 97 05/10/2020   PLT 127 (L) 05/10/2020    BMET    Component Value Date/Time   NA 142 05/10/2020 1446   NA 138 08/13/2011 1058   K 4.0 05/10/2020 1446   K 4.3 08/13/2011 1058   CL 97 05/10/2020 1446   CL 96 (L) 08/13/2011 1058   CO2 31 (H) 05/10/2020 1446   CO2 29 08/13/2011 1058   GLUCOSE 182 (H) 05/10/2020 1446   GLUCOSE 308 (H) 05/23/2017 0008   GLUCOSE 294 (H) 08/13/2011 1058   BUN 23 05/10/2020 1446   BUN 49 (H) 08/13/2011 1058    CREATININE 7.63 (H) 05/10/2020 1446   CREATININE 8.72 (H) 08/13/2011 1058   CALCIUM 8.7 05/10/2020 1446   CALCIUM 9.0 08/13/2011 1058   GFRNONAA 7 (L) 05/10/2020 1446   GFRNONAA 7 (L) 08/13/2011 1058   GFRAA 8 (L) 05/10/2020 1446   GFRAA 8 (L) 08/13/2011 1058   CrCl cannot be calculated (Patient's most recent lab result is older than the maximum 21 days allowed.).  COAG Lab Results  Component Value Date   INR 1.43 03/20/2017    Radiology PERIPHERAL VASCULAR CATHETERIZATION  Result Date: 09/01/2020 See Op Note    Assessment/Plan 1. Complication from renal dialysis device, sequela Recommend:  The patient is doing well and currently has adequate dialysis access. The patient's dialysis center is not reporting any access issues. Flow pattern is stable when compared to the prior ultrasound.  The patient should have a duplex ultrasound of the dialysis access in 6 months. The patient  will follow-up with me in the office after each ultrasound   - VAS [place] (AVF, AVG); Future  2. Atherosclerosis of native artery of both lower extremities with intermittent claudication (HCC)  Recommend:  The patient has evidence of atherosclerosis of the lower extremities with claudication.  The patient does not voice lifestyle limiting changes at this point in time.  Noninvasive studies do not suggest clinically significant change.  No invasive studies, angiography or surgery at this time The patient should continue walking and begin a more formal exercise program.  The patient should continue antiplatelet therapy and aggressive treatment of the lipid abnormalities  No changes in the patient's medications at this time  The patient should continue wearing graduated compression socks 10-15 mmHg strength to control the mild edema.    3. Hypertension associated with diabetes ([place]) Continue antihypertensive medications as already ordered, these medications have been  reviewed and there are no changes at this time.   4. Type 2 diabetes mellitus with hyperglycemia, without long-term current use of insulin (HCC) Continue hypoglycemic medications as already ordered, these medications have been reviewed and there are no changes at this time.  Hgb A1C to be monitored as already arranged by primary service   5. End stage renal disease ([place]) At the present time the patient has adequate dialysis access.  Continue hemodialysis as ordered without interruption.  Avoid nephrotoxic medications and dehydration.  Further plans per nephrology    Hortencia Pilar, MD  09/29/2020 8:46 AM

## 2020-09-30 ENCOUNTER — Encounter (INDEPENDENT_AMBULATORY_CARE_PROVIDER_SITE_OTHER): Payer: Self-pay | Admitting: Vascular Surgery

## 2020-09-30 ENCOUNTER — Ambulatory Visit (INDEPENDENT_AMBULATORY_CARE_PROVIDER_SITE_OTHER): Payer: Medicare Other | Admitting: Vascular Surgery

## 2020-09-30 ENCOUNTER — Other Ambulatory Visit: Payer: Self-pay

## 2020-09-30 ENCOUNTER — Ambulatory Visit (INDEPENDENT_AMBULATORY_CARE_PROVIDER_SITE_OTHER): Payer: Medicare Other

## 2020-09-30 VITALS — BP 117/89 | HR 92 | Ht 76.0 in | Wt 253.0 lb

## 2020-09-30 DIAGNOSIS — I152 Hypertension secondary to endocrine disorders: Secondary | ICD-10-CM

## 2020-09-30 DIAGNOSIS — N186 End stage renal disease: Secondary | ICD-10-CM

## 2020-09-30 DIAGNOSIS — I70213 Atherosclerosis of native arteries of extremities with intermittent claudication, bilateral legs: Secondary | ICD-10-CM

## 2020-09-30 DIAGNOSIS — T829XXS Unspecified complication of cardiac and vascular prosthetic device, implant and graft, sequela: Secondary | ICD-10-CM | POA: Diagnosis not present

## 2020-09-30 DIAGNOSIS — E1159 Type 2 diabetes mellitus with other circulatory complications: Secondary | ICD-10-CM | POA: Diagnosis not present

## 2020-09-30 DIAGNOSIS — Z992 Dependence on renal dialysis: Secondary | ICD-10-CM

## 2020-09-30 DIAGNOSIS — E1165 Type 2 diabetes mellitus with hyperglycemia: Secondary | ICD-10-CM

## 2020-10-01 DIAGNOSIS — Z992 Dependence on renal dialysis: Secondary | ICD-10-CM | POA: Diagnosis not present

## 2020-10-01 DIAGNOSIS — N2581 Secondary hyperparathyroidism of renal origin: Secondary | ICD-10-CM | POA: Diagnosis not present

## 2020-10-01 DIAGNOSIS — N186 End stage renal disease: Secondary | ICD-10-CM | POA: Diagnosis not present

## 2020-10-01 DIAGNOSIS — D509 Iron deficiency anemia, unspecified: Secondary | ICD-10-CM | POA: Diagnosis not present

## 2020-10-01 DIAGNOSIS — D631 Anemia in chronic kidney disease: Secondary | ICD-10-CM | POA: Diagnosis not present

## 2020-10-04 DIAGNOSIS — D631 Anemia in chronic kidney disease: Secondary | ICD-10-CM | POA: Diagnosis not present

## 2020-10-04 DIAGNOSIS — Z992 Dependence on renal dialysis: Secondary | ICD-10-CM | POA: Diagnosis not present

## 2020-10-04 DIAGNOSIS — D509 Iron deficiency anemia, unspecified: Secondary | ICD-10-CM | POA: Diagnosis not present

## 2020-10-04 DIAGNOSIS — N2581 Secondary hyperparathyroidism of renal origin: Secondary | ICD-10-CM | POA: Diagnosis not present

## 2020-10-04 DIAGNOSIS — N186 End stage renal disease: Secondary | ICD-10-CM | POA: Diagnosis not present

## 2020-10-06 DIAGNOSIS — D631 Anemia in chronic kidney disease: Secondary | ICD-10-CM | POA: Diagnosis not present

## 2020-10-06 DIAGNOSIS — D509 Iron deficiency anemia, unspecified: Secondary | ICD-10-CM | POA: Diagnosis not present

## 2020-10-06 DIAGNOSIS — Z992 Dependence on renal dialysis: Secondary | ICD-10-CM | POA: Diagnosis not present

## 2020-10-06 DIAGNOSIS — N2581 Secondary hyperparathyroidism of renal origin: Secondary | ICD-10-CM | POA: Diagnosis not present

## 2020-10-06 DIAGNOSIS — N186 End stage renal disease: Secondary | ICD-10-CM | POA: Diagnosis not present

## 2020-10-07 DIAGNOSIS — Z992 Dependence on renal dialysis: Secondary | ICD-10-CM | POA: Diagnosis not present

## 2020-10-07 DIAGNOSIS — N186 End stage renal disease: Secondary | ICD-10-CM | POA: Diagnosis not present

## 2020-10-08 DIAGNOSIS — N186 End stage renal disease: Secondary | ICD-10-CM | POA: Diagnosis not present

## 2020-10-08 DIAGNOSIS — N2581 Secondary hyperparathyroidism of renal origin: Secondary | ICD-10-CM | POA: Diagnosis not present

## 2020-10-08 DIAGNOSIS — D631 Anemia in chronic kidney disease: Secondary | ICD-10-CM | POA: Diagnosis not present

## 2020-10-08 DIAGNOSIS — Z992 Dependence on renal dialysis: Secondary | ICD-10-CM | POA: Diagnosis not present

## 2020-10-08 DIAGNOSIS — D509 Iron deficiency anemia, unspecified: Secondary | ICD-10-CM | POA: Diagnosis not present

## 2020-10-09 ENCOUNTER — Other Ambulatory Visit: Payer: Self-pay | Admitting: Family Medicine

## 2020-10-09 NOTE — Telephone Encounter (Signed)
Requested Prescriptions  Pending Prescriptions Disp Refills  . ANORO ELLIPTA 62.5-25 MCG/INH AEPB [Pharmacy Med Name: Celedonio Miyamoto 62.5-25 ORAL INH(30S)] 180 each 1    Sig: INHALE 1 PUFF INTO THE LUNGS DAILY     Pulmonology:  Combination Products Passed - 10/09/2020 11:22 AM      Passed - Valid encounter within last 12 months    Recent Outpatient Visits          2 months ago Type 2 diabetes mellitus with hyperglycemia, without long-term current use of insulin ([place])   [place], Megan P, DO   4 months ago Pneumonia of left lower lobe due to infectious organism   [place], Megan P, DO   4 months ago Pneumonia of left lower lobe due to infectious organism   [facility], Megan P, DO   5 months ago Type 2 diabetes mellitus with hyperglycemia, without long-term current use of insulin [facility])   [place], [place], DO   11 months ago End stage renal disease [facility])   [facility] [place], [place]

## 2020-10-11 DIAGNOSIS — D631 Anemia in chronic kidney disease: Secondary | ICD-10-CM | POA: Diagnosis not present

## 2020-10-11 DIAGNOSIS — N2581 Secondary hyperparathyroidism of renal origin: Secondary | ICD-10-CM | POA: Diagnosis not present

## 2020-10-11 DIAGNOSIS — N186 End stage renal disease: Secondary | ICD-10-CM | POA: Diagnosis not present

## 2020-10-11 DIAGNOSIS — Z992 Dependence on renal dialysis: Secondary | ICD-10-CM | POA: Diagnosis not present

## 2020-10-11 DIAGNOSIS — D509 Iron deficiency anemia, unspecified: Secondary | ICD-10-CM | POA: Diagnosis not present

## 2020-10-13 DIAGNOSIS — D509 Iron deficiency anemia, unspecified: Secondary | ICD-10-CM | POA: Diagnosis not present

## 2020-10-13 DIAGNOSIS — N186 End stage renal disease: Secondary | ICD-10-CM | POA: Diagnosis not present

## 2020-10-13 DIAGNOSIS — D631 Anemia in chronic kidney disease: Secondary | ICD-10-CM | POA: Diagnosis not present

## 2020-10-13 DIAGNOSIS — N2581 Secondary hyperparathyroidism of renal origin: Secondary | ICD-10-CM | POA: Diagnosis not present

## 2020-10-13 DIAGNOSIS — Z992 Dependence on renal dialysis: Secondary | ICD-10-CM | POA: Diagnosis not present

## 2020-10-15 DIAGNOSIS — N186 End stage renal disease: Secondary | ICD-10-CM | POA: Diagnosis not present

## 2020-10-15 DIAGNOSIS — Z992 Dependence on renal dialysis: Secondary | ICD-10-CM | POA: Diagnosis not present

## 2020-10-15 DIAGNOSIS — D509 Iron deficiency anemia, unspecified: Secondary | ICD-10-CM | POA: Diagnosis not present

## 2020-10-15 DIAGNOSIS — D631 Anemia in chronic kidney disease: Secondary | ICD-10-CM | POA: Diagnosis not present

## 2020-10-15 DIAGNOSIS — N2581 Secondary hyperparathyroidism of renal origin: Secondary | ICD-10-CM | POA: Diagnosis not present

## 2020-10-18 DIAGNOSIS — D509 Iron deficiency anemia, unspecified: Secondary | ICD-10-CM | POA: Diagnosis not present

## 2020-10-18 DIAGNOSIS — N2581 Secondary hyperparathyroidism of renal origin: Secondary | ICD-10-CM | POA: Diagnosis not present

## 2020-10-18 DIAGNOSIS — Z992 Dependence on renal dialysis: Secondary | ICD-10-CM | POA: Diagnosis not present

## 2020-10-18 DIAGNOSIS — N186 End stage renal disease: Secondary | ICD-10-CM | POA: Diagnosis not present

## 2020-10-18 DIAGNOSIS — D631 Anemia in chronic kidney disease: Secondary | ICD-10-CM | POA: Diagnosis not present

## 2020-10-18 DIAGNOSIS — E119 Type 2 diabetes mellitus without complications: Secondary | ICD-10-CM | POA: Diagnosis not present

## 2020-10-20 DIAGNOSIS — N186 End stage renal disease: Secondary | ICD-10-CM | POA: Diagnosis not present

## 2020-10-20 DIAGNOSIS — Z992 Dependence on renal dialysis: Secondary | ICD-10-CM | POA: Diagnosis not present

## 2020-10-20 DIAGNOSIS — D631 Anemia in chronic kidney disease: Secondary | ICD-10-CM | POA: Diagnosis not present

## 2020-10-20 DIAGNOSIS — N2581 Secondary hyperparathyroidism of renal origin: Secondary | ICD-10-CM | POA: Diagnosis not present

## 2020-10-20 DIAGNOSIS — D509 Iron deficiency anemia, unspecified: Secondary | ICD-10-CM | POA: Diagnosis not present

## 2020-10-21 DIAGNOSIS — Z992 Dependence on renal dialysis: Secondary | ICD-10-CM | POA: Diagnosis not present

## 2020-10-21 DIAGNOSIS — I159 Secondary hypertension, unspecified: Secondary | ICD-10-CM | POA: Diagnosis not present

## 2020-10-21 DIAGNOSIS — J449 Chronic obstructive pulmonary disease, unspecified: Secondary | ICD-10-CM | POA: Diagnosis not present

## 2020-10-21 DIAGNOSIS — N186 End stage renal disease: Secondary | ICD-10-CM | POA: Diagnosis not present

## 2020-10-21 DIAGNOSIS — E11621 Type 2 diabetes mellitus with foot ulcer: Secondary | ICD-10-CM | POA: Diagnosis not present

## 2020-10-21 DIAGNOSIS — Z6841 Body Mass Index (BMI) 40.0 and over, adult: Secondary | ICD-10-CM | POA: Diagnosis not present

## 2020-10-21 DIAGNOSIS — E1165 Type 2 diabetes mellitus with hyperglycemia: Secondary | ICD-10-CM | POA: Diagnosis not present

## 2020-10-21 DIAGNOSIS — I5032 Chronic diastolic (congestive) heart failure: Secondary | ICD-10-CM | POA: Diagnosis not present

## 2020-10-21 DIAGNOSIS — L97524 Non-pressure chronic ulcer of other part of left foot with necrosis of bone: Secondary | ICD-10-CM | POA: Diagnosis not present

## 2020-10-21 DIAGNOSIS — I739 Peripheral vascular disease, unspecified: Secondary | ICD-10-CM | POA: Diagnosis not present

## 2020-10-22 DIAGNOSIS — D631 Anemia in chronic kidney disease: Secondary | ICD-10-CM | POA: Diagnosis not present

## 2020-10-22 DIAGNOSIS — N2581 Secondary hyperparathyroidism of renal origin: Secondary | ICD-10-CM | POA: Diagnosis not present

## 2020-10-22 DIAGNOSIS — Z992 Dependence on renal dialysis: Secondary | ICD-10-CM | POA: Diagnosis not present

## 2020-10-22 DIAGNOSIS — N186 End stage renal disease: Secondary | ICD-10-CM | POA: Diagnosis not present

## 2020-10-22 DIAGNOSIS — D509 Iron deficiency anemia, unspecified: Secondary | ICD-10-CM | POA: Diagnosis not present

## 2020-10-25 DIAGNOSIS — N186 End stage renal disease: Secondary | ICD-10-CM | POA: Diagnosis not present

## 2020-10-25 DIAGNOSIS — D509 Iron deficiency anemia, unspecified: Secondary | ICD-10-CM | POA: Diagnosis not present

## 2020-10-25 DIAGNOSIS — Z992 Dependence on renal dialysis: Secondary | ICD-10-CM | POA: Diagnosis not present

## 2020-10-25 DIAGNOSIS — N2581 Secondary hyperparathyroidism of renal origin: Secondary | ICD-10-CM | POA: Diagnosis not present

## 2020-10-25 DIAGNOSIS — D631 Anemia in chronic kidney disease: Secondary | ICD-10-CM | POA: Diagnosis not present

## 2020-10-27 DIAGNOSIS — N2581 Secondary hyperparathyroidism of renal origin: Secondary | ICD-10-CM | POA: Diagnosis not present

## 2020-10-27 DIAGNOSIS — Z992 Dependence on renal dialysis: Secondary | ICD-10-CM | POA: Diagnosis not present

## 2020-10-27 DIAGNOSIS — D631 Anemia in chronic kidney disease: Secondary | ICD-10-CM | POA: Diagnosis not present

## 2020-10-27 DIAGNOSIS — D509 Iron deficiency anemia, unspecified: Secondary | ICD-10-CM | POA: Diagnosis not present

## 2020-10-27 DIAGNOSIS — N186 End stage renal disease: Secondary | ICD-10-CM | POA: Diagnosis not present

## 2020-10-28 ENCOUNTER — Other Ambulatory Visit: Payer: Self-pay

## 2020-10-28 ENCOUNTER — Ambulatory Visit (INDEPENDENT_AMBULATORY_CARE_PROVIDER_SITE_OTHER): Payer: Medicare Other | Admitting: Nurse Practitioner

## 2020-10-28 ENCOUNTER — Encounter: Payer: Self-pay | Admitting: Nurse Practitioner

## 2020-10-28 ENCOUNTER — Ambulatory Visit
Admission: RE | Admit: 2020-10-28 | Discharge: 2020-10-28 | Disposition: A | Discharge status: Home / Self Care | Payer: Medicare Other | Source: Home / Self Care | Attending: Nurse Practitioner | Admitting: Nurse Practitioner

## 2020-10-28 ENCOUNTER — Ambulatory Visit
Admission: RE | Admit: 2020-10-28 | Discharge: 2020-10-28 | Disposition: A | Discharge status: Home / Self Care | Payer: Medicare Other | Source: Ambulatory Visit | Attending: Nurse Practitioner | Admitting: Nurse Practitioner

## 2020-10-28 DIAGNOSIS — R0981 Nasal congestion: Secondary | ICD-10-CM

## 2020-10-28 DIAGNOSIS — R059 Cough, unspecified: Secondary | ICD-10-CM

## 2020-10-28 DIAGNOSIS — R0602 Shortness of breath: Secondary | ICD-10-CM | POA: Diagnosis not present

## 2020-10-28 DIAGNOSIS — I70213 Atherosclerosis of native arteries of extremities with intermittent claudication, bilateral legs: Secondary | ICD-10-CM

## 2020-10-28 DIAGNOSIS — I700 Atherosclerosis of aorta: Secondary | ICD-10-CM | POA: Diagnosis not present

## 2020-10-28 MED ORDER — DOXYCYCLINE HYCLATE 100 MG PO TABS
100.0000 mg | ORAL_TABLET | Freq: Two times a day (BID) | ORAL | 0 refills | Status: DC
Start: 2020-10-28 — End: 2021-04-28

## 2020-10-28 MED ORDER — FLUTICASONE PROPIONATE 50 MCG/ACT NA SUSP: 2.0000 | Freq: Every day | NASAL | 3 refills | Status: DC

## 2020-10-28 NOTE — Patient Instructions (Signed)
It was great to see you!  Let's check a chest x-ray to make sure you don't have pneumonia. I sent an antibiotic and a nasal spray to your pharmacy. Let us know if you are not feeling any better in a week.  Take care,  Vance Peper, NP

## 2020-10-28 NOTE — Progress Notes (Signed)
Acute Office Visit  Subjective:    Patient ID: Timothy Gibson, male    DOB: [birth year], 70 y.o.   MRN: [id number]  Chief Complaint  Patient presents with  . Cough    Patient states he has some mucus and phlegm coming out when he blows his nose and coughs. Patient states his symptoms started about a week ago. Patient denies having any other symptoms. Patient denies trying OTC medications.     HPI Patient is in today for a cough and nasal congestion that started a week ago after choking on a drink. He does not usually have trouble swallowing liquids or foods. He has not tried anything as he goes to dialysis and likes to get options from the doctor's office.   UPPER RESPIRATORY TRACT INFECTION  Worst symptom: phlegm Fever: no Cough: yes Shortness of breath: no Wheezing: no Chest pain: no Chest tightness: no Chest congestion: no Nasal congestion: yes Runny nose: yes Post nasal drip: no Sneezing: yes Sore throat: no Swollen glands: no Sinus pressure: no Headache: no Face pain: no Toothache: no Ear pain: no  Ear pressure: no  Eyes red/itching:no Eye drainage/crusting: no  Vomiting: no Rash: no Fatigue: yes  Sick contacts: no Strep contacts: no   Context: fluctuating Recurrent sinusitis: no Relief with OTC cold/cough medications: n/a  Treatments attempted: none    Past Medical History:  Diagnosis Date  . Cancer ([place])    right kidney  . Diabetes mellitus without complication ([place])   . Dialysis patient [facility])    on Mon-Wed-Fri dialysis  . ESRD (end stage renal disease) ([place])   . History of gangrene   . Hypertension     Past Surgical History:  Procedure Laterality Date  . A/V FISTULAGRAM Left 06/04/2018   Procedure: A/V FISTULAGRAM;  Surgeon: Katha Cabal, MD;  Location: [place] [facility];  Service: Cardiovascular;  Laterality: Left;  . COLONOSCOPY WITH PROPOFOL N/A 12/16/2015   Procedure: COLONOSCOPY WITH PROPOFOL;  Surgeon: Manya Silvas, MD;   Location[facility] ENDOSCOPY;  Service: Endoscopy;  Laterality: N/A;  . KNEE SURGERY    . left forearm AV fistula    . LOWER EXTREMITY ANGIOGRAPHY Left 07/24/2019   Procedure: LOWER EXTREMITY ANGIOGRAPHY;  Surgeon: Algernon Huxley, MD;  Location: [place] [facility];  Service: Cardiovascular;  Laterality: Left;  . NEPHRECTOMY    . PERIPHERAL VASCULAR THROMBECTOMY Left 09/01/2020   Procedure: PERIPHERAL VASCULAR THROMBECTOMY;  Surgeon: Katha Cabal, MD;  Location: [place] [facility];  Service: Cardiovascular;  Laterality: Left;    Family History  Problem Relation Age of Onset  . Hypertension Father   . Colon cancer Father   . Hypertension Mother   . Parkinson's disease Mother   . Diabetes Mother   . Alzheimer's disease Mother     Social History   Socioeconomic History  . Marital status: Divorced    Spouse name: Not on file  . Number of children: 4  . Years of education: Not on file  . Highest education level: Not on file  Occupational History  . Occupation: retired    Comment: Nutritional therapist  Tobacco Use  . Smoking status: Current Some Day Smoker    Types: Cigars  . Smokeless tobacco: Former Systems developer  . Tobacco comment: cigar smoker-hasnt smoked in 5 mos.   Vaping Use  . Vaping Use: Never used  Substance and Sexual Activity  . Alcohol use: Not Currently  . Drug use: Never  . Sexual activity: Not  Currently  Other Topics Concern  . Not on file  Social History Narrative   Independent, lives with Ms. Jennefer Bravo and her spouse at home.   Social Determinants of Health   Financial Resource Strain: Not on file  Food Insecurity: Not on file  Transportation Needs: Not on file  Physical Activity: Not on file  Stress: Not on file  Social Connections: Not on file  Intimate Partner Violence: Not on file    Outpatient Medications Prior to Visit  Medication Sig Dispense Refill  . albuterol (VENTOLIN HFA) 108 (90 Base) MCG/ACT inhaler Inhale 2 puffs into the lungs every 6 (six)  hours as needed for wheezing or shortness of breath. 18 g 5  . amLODipine (NORVASC) 5 MG tablet TAKE 1 TABLET(5 MG) BY MOUTH DAILY (Patient taking differently: Take 5 mg by mouth daily.) 90 tablet 1  . ANORO ELLIPTA 62.5-25 MCG/INH AEPB INHALE 1 PUFF INTO THE LUNGS DAILY 180 each 1  . insulin degludec (TRESIBA FLEXTOUCH) 100 UNIT/ML FlexTouch Pen INJECT 10 UNITS TOTAL INTO THE SKIN AT BEDTIME (Patient taking differently: Inject into the skin at bedtime.) 6 mL 3  . losartan (COZAAR) 100 MG tablet TAKE 1 TABLET(100 MG) BY MOUTH DAILY (Patient taking differently: Take 100 mg by mouth daily.) 90 tablet 1  . sevelamer carbonate (RENVELA) 800 MG tablet Take 2,400-3,200 mg by mouth 3 (three) times daily with meals.    . urea (CARMOL) 40 % ointment Apply 1 application topically daily as needed (Dead skin).     No facility-administered medications prior to visit.    Allergies  Allergen Reactions  . Adhesive [Tape]     Pulls skin off.  Must use paper tape    Review of Systems  Constitutional: Positive for fatigue. Negative for fever.  HENT: Positive for congestion, rhinorrhea and sneezing. Negative for ear pain, postnasal drip, sinus pressure and sore throat.   Eyes: Negative.   Respiratory: Positive for cough. Negative for shortness of breath.   Cardiovascular: Negative.   Gastrointestinal: Negative.   Musculoskeletal: Negative.        Objective:    Physical Exam Vitals and nursing note reviewed.  Pulmonary:     Comments: Able to talk in complete sentences Neurological:     Mental Status: He is alert and oriented to person, place, and time.     There were no vitals taken for this visit. Wt Readings from Last 3 Encounters:  09/30/20 253 lb (114.8 kg)  09/01/20 247 lb (112 kg)  05/21/20 256 lb (116.1 kg)    Health Maintenance Due  Topic Date Due  . Hepatitis C Screening  Never done  . FOOT EXAM  07/07/2020    There are no preventive care reminders to display for this  patient.   Lab Results  Component Value Date   TSH 0.686 05/10/2020   Lab Results  Component Value Date   WBC 3.7 05/10/2020   HGB 9.9 (L) 05/10/2020   HCT 30.3 (L) 05/10/2020   MCV 97 05/10/2020   PLT 127 (L) 05/10/2020   Lab Results  Component Value Date   NA 142 05/10/2020   K 4.0 05/10/2020   CO2 31 (H) 05/10/2020   GLUCOSE 182 (H) 05/10/2020   BUN 23 05/10/2020   CREATININE 7.63 (H) 05/10/2020   BILITOT 0.3 05/10/2020   ALKPHOS 94 05/10/2020   AST 12 05/10/2020   ALT 8 05/10/2020   PROT 6.6 05/10/2020   ALBUMIN 3.7 (L) 05/10/2020   CALCIUM 8.7  05/10/2020   ANIONGAP 15 05/23/2017   Lab Results  Component Value Date   CHOL 99 (L) 05/10/2020   Lab Results  Component Value Date   HDL 36 (L) 05/10/2020   Lab Results  Component Value Date   LDLCALC 47 05/10/2020   Lab Results  Component Value Date   TRIG 74 05/10/2020   Lab Results  Component Value Date   CHOLHDL 4.5 09/25/2015   Lab Results  Component Value Date   HGBA1C 5.6 05/10/2020       Assessment & Plan:   Problem List Items Addressed This Visit   None   Visit Diagnoses    Cough    -  Primary   will check cxr as has history of pneumonia and r/o aspiration pneumonia. Treat with doxycycline. Call if symptoms worsen/do not improve   Relevant Orders   DG Chest 2 View   Novel Coronavirus, NAA ([facility])   Nasal congestion       Check covid-19 test. Has had 3 vaccines. Flonase sent to pharmacy. Encouraged rest and fluids. F/U if symptoms worsen or do not improve.    Relevant Orders   Novel Coronavirus, NAA ([facility])       Meds ordered this encounter  Medications  . doxycycline (VIBRA-TABS) 100 MG tablet    Sig: Take 1 tablet (100 mg total) by mouth 2 (two) times daily.    Dispense:  20 tablet    Refill:  0  . fluticasone (FLONASE) 50 MCG/ACT nasal spray    Sig: Place 2 sprays into both nostrils daily.    Dispense:  16 g    Refill:  3    . This visit was completed via telephone  due to the restrictions of the COVID-19 pandemic. All issues as above were discussed and addressed but no physical exam was performed. If it was felt that the patient should be evaluated in the office, they were directed there. The patient verbally consented to this visit. Patient was unable to complete an audio/visual visit due to Technical difficulties, Lack of internet. Due to the catastrophic nature of the COVID-19 pandemic, this visit was done through audio contact only. . Location of the patient: home . Location of the provider: work . Those involved with this call:  . Provider: Billy Fischer, DNP . CMA: Irena Reichmann, CMA . Front Desk/Registration: Jill Side  . Time spent on call: 15 minutes on the phone discussing health concerns. 15 minutes total spent in review of patient's record and preparation of their chart.    Charyl Dancer, NP

## 2020-10-29 DIAGNOSIS — Z992 Dependence on renal dialysis: Secondary | ICD-10-CM | POA: Diagnosis not present

## 2020-10-29 DIAGNOSIS — N186 End stage renal disease: Secondary | ICD-10-CM | POA: Diagnosis not present

## 2020-10-29 DIAGNOSIS — D509 Iron deficiency anemia, unspecified: Secondary | ICD-10-CM | POA: Diagnosis not present

## 2020-10-29 DIAGNOSIS — N2581 Secondary hyperparathyroidism of renal origin: Secondary | ICD-10-CM | POA: Diagnosis not present

## 2020-10-29 DIAGNOSIS — D631 Anemia in chronic kidney disease: Secondary | ICD-10-CM | POA: Diagnosis not present

## 2020-10-29 LAB — NOVEL CORONAVIRUS, NAA: SARS-CoV-2, NAA: NOT DETECTED

## 2020-10-29 LAB — SARS-COV-2, NAA 2 DAY TAT

## 2020-11-01 DIAGNOSIS — D509 Iron deficiency anemia, unspecified: Secondary | ICD-10-CM | POA: Diagnosis not present

## 2020-11-01 DIAGNOSIS — N186 End stage renal disease: Secondary | ICD-10-CM | POA: Diagnosis not present

## 2020-11-01 DIAGNOSIS — Z992 Dependence on renal dialysis: Secondary | ICD-10-CM | POA: Diagnosis not present

## 2020-11-01 DIAGNOSIS — N2581 Secondary hyperparathyroidism of renal origin: Secondary | ICD-10-CM | POA: Diagnosis not present

## 2020-11-01 DIAGNOSIS — D631 Anemia in chronic kidney disease: Secondary | ICD-10-CM | POA: Diagnosis not present

## 2020-11-03 DIAGNOSIS — D509 Iron deficiency anemia, unspecified: Secondary | ICD-10-CM | POA: Diagnosis not present

## 2020-11-03 DIAGNOSIS — D631 Anemia in chronic kidney disease: Secondary | ICD-10-CM | POA: Diagnosis not present

## 2020-11-03 DIAGNOSIS — N2581 Secondary hyperparathyroidism of renal origin: Secondary | ICD-10-CM | POA: Diagnosis not present

## 2020-11-03 DIAGNOSIS — N186 End stage renal disease: Secondary | ICD-10-CM | POA: Diagnosis not present

## 2020-11-03 DIAGNOSIS — Z992 Dependence on renal dialysis: Secondary | ICD-10-CM | POA: Diagnosis not present

## 2020-11-05 DIAGNOSIS — D509 Iron deficiency anemia, unspecified: Secondary | ICD-10-CM | POA: Diagnosis not present

## 2020-11-05 DIAGNOSIS — D631 Anemia in chronic kidney disease: Secondary | ICD-10-CM | POA: Diagnosis not present

## 2020-11-05 DIAGNOSIS — N2581 Secondary hyperparathyroidism of renal origin: Secondary | ICD-10-CM | POA: Diagnosis not present

## 2020-11-05 DIAGNOSIS — Z992 Dependence on renal dialysis: Secondary | ICD-10-CM | POA: Diagnosis not present

## 2020-11-05 DIAGNOSIS — N186 End stage renal disease: Secondary | ICD-10-CM | POA: Diagnosis not present

## 2020-11-06 DIAGNOSIS — N186 End stage renal disease: Secondary | ICD-10-CM | POA: Diagnosis not present

## 2020-11-06 DIAGNOSIS — Z992 Dependence on renal dialysis: Secondary | ICD-10-CM | POA: Diagnosis not present

## 2020-11-08 DIAGNOSIS — N186 End stage renal disease: Secondary | ICD-10-CM | POA: Diagnosis not present

## 2020-11-08 DIAGNOSIS — Z992 Dependence on renal dialysis: Secondary | ICD-10-CM | POA: Diagnosis not present

## 2020-11-08 DIAGNOSIS — E8779 Other fluid overload: Secondary | ICD-10-CM | POA: Diagnosis not present

## 2020-11-08 DIAGNOSIS — D631 Anemia in chronic kidney disease: Secondary | ICD-10-CM | POA: Diagnosis not present

## 2020-11-08 DIAGNOSIS — D509 Iron deficiency anemia, unspecified: Secondary | ICD-10-CM | POA: Diagnosis not present

## 2020-11-08 DIAGNOSIS — N2581 Secondary hyperparathyroidism of renal origin: Secondary | ICD-10-CM | POA: Diagnosis not present

## 2020-11-09 ENCOUNTER — Telehealth: Payer: Self-pay | Admitting: Nurse Practitioner

## 2020-11-09 ENCOUNTER — Other Ambulatory Visit: Payer: Self-pay | Admitting: Nurse Practitioner

## 2020-11-09 MED ORDER — BENZONATATE 100 MG PO CAPS: 100.0000 mg | ORAL_CAPSULE | Freq: Two times a day (BID) | ORAL | 0 refills | Status: DC | PRN

## 2020-11-09 NOTE — Telephone Encounter (Signed)
Called pt advised of Lauren's message. Pt verbalized understanding 

## 2020-11-09 NOTE — Telephone Encounter (Signed)
Pt stated that his cough has not gone away or gotten any better/ he was given an antibiotic and a nasal spray and wants to know if provider can send in Rx for cough medication or tessalon pearls/ please advise or send to  [facility] Phone:  [phone]  Fax:  [phone]

## 2020-11-09 NOTE — Telephone Encounter (Signed)
Please advise pt seen 4/21

## 2020-11-10 DIAGNOSIS — E8779 Other fluid overload: Secondary | ICD-10-CM | POA: Diagnosis not present

## 2020-11-10 DIAGNOSIS — N2581 Secondary hyperparathyroidism of renal origin: Secondary | ICD-10-CM | POA: Diagnosis not present

## 2020-11-10 DIAGNOSIS — D509 Iron deficiency anemia, unspecified: Secondary | ICD-10-CM | POA: Diagnosis not present

## 2020-11-10 DIAGNOSIS — N186 End stage renal disease: Secondary | ICD-10-CM | POA: Diagnosis not present

## 2020-11-10 DIAGNOSIS — D631 Anemia in chronic kidney disease: Secondary | ICD-10-CM | POA: Diagnosis not present

## 2020-11-10 DIAGNOSIS — Z992 Dependence on renal dialysis: Secondary | ICD-10-CM | POA: Diagnosis not present

## 2020-11-12 DIAGNOSIS — E8779 Other fluid overload: Secondary | ICD-10-CM | POA: Diagnosis not present

## 2020-11-12 DIAGNOSIS — Z992 Dependence on renal dialysis: Secondary | ICD-10-CM | POA: Diagnosis not present

## 2020-11-12 DIAGNOSIS — D631 Anemia in chronic kidney disease: Secondary | ICD-10-CM | POA: Diagnosis not present

## 2020-11-12 DIAGNOSIS — D509 Iron deficiency anemia, unspecified: Secondary | ICD-10-CM | POA: Diagnosis not present

## 2020-11-12 DIAGNOSIS — N2581 Secondary hyperparathyroidism of renal origin: Secondary | ICD-10-CM | POA: Diagnosis not present

## 2020-11-12 DIAGNOSIS — N186 End stage renal disease: Secondary | ICD-10-CM | POA: Diagnosis not present

## 2020-11-15 DIAGNOSIS — Z992 Dependence on renal dialysis: Secondary | ICD-10-CM | POA: Diagnosis not present

## 2020-11-15 DIAGNOSIS — D631 Anemia in chronic kidney disease: Secondary | ICD-10-CM | POA: Diagnosis not present

## 2020-11-15 DIAGNOSIS — D509 Iron deficiency anemia, unspecified: Secondary | ICD-10-CM | POA: Diagnosis not present

## 2020-11-15 DIAGNOSIS — N186 End stage renal disease: Secondary | ICD-10-CM | POA: Diagnosis not present

## 2020-11-15 DIAGNOSIS — N2581 Secondary hyperparathyroidism of renal origin: Secondary | ICD-10-CM | POA: Diagnosis not present

## 2020-11-15 DIAGNOSIS — E8779 Other fluid overload: Secondary | ICD-10-CM | POA: Diagnosis not present

## 2020-11-17 DIAGNOSIS — N2581 Secondary hyperparathyroidism of renal origin: Secondary | ICD-10-CM | POA: Diagnosis not present

## 2020-11-17 DIAGNOSIS — E8779 Other fluid overload: Secondary | ICD-10-CM | POA: Diagnosis not present

## 2020-11-17 DIAGNOSIS — Z992 Dependence on renal dialysis: Secondary | ICD-10-CM | POA: Diagnosis not present

## 2020-11-17 DIAGNOSIS — N186 End stage renal disease: Secondary | ICD-10-CM | POA: Diagnosis not present

## 2020-11-17 DIAGNOSIS — D509 Iron deficiency anemia, unspecified: Secondary | ICD-10-CM | POA: Diagnosis not present

## 2020-11-17 DIAGNOSIS — D631 Anemia in chronic kidney disease: Secondary | ICD-10-CM | POA: Diagnosis not present

## 2020-11-18 DIAGNOSIS — N2581 Secondary hyperparathyroidism of renal origin: Secondary | ICD-10-CM | POA: Diagnosis not present

## 2020-11-18 DIAGNOSIS — N186 End stage renal disease: Secondary | ICD-10-CM | POA: Diagnosis not present

## 2020-11-18 DIAGNOSIS — D631 Anemia in chronic kidney disease: Secondary | ICD-10-CM | POA: Diagnosis not present

## 2020-11-18 DIAGNOSIS — D509 Iron deficiency anemia, unspecified: Secondary | ICD-10-CM | POA: Diagnosis not present

## 2020-11-18 DIAGNOSIS — E8779 Other fluid overload: Secondary | ICD-10-CM | POA: Diagnosis not present

## 2020-11-18 DIAGNOSIS — Z992 Dependence on renal dialysis: Secondary | ICD-10-CM | POA: Diagnosis not present

## 2020-11-19 DIAGNOSIS — D509 Iron deficiency anemia, unspecified: Secondary | ICD-10-CM | POA: Diagnosis not present

## 2020-11-19 DIAGNOSIS — E8779 Other fluid overload: Secondary | ICD-10-CM | POA: Diagnosis not present

## 2020-11-19 DIAGNOSIS — Z992 Dependence on renal dialysis: Secondary | ICD-10-CM | POA: Diagnosis not present

## 2020-11-19 DIAGNOSIS — D631 Anemia in chronic kidney disease: Secondary | ICD-10-CM | POA: Diagnosis not present

## 2020-11-19 DIAGNOSIS — N2581 Secondary hyperparathyroidism of renal origin: Secondary | ICD-10-CM | POA: Diagnosis not present

## 2020-11-19 DIAGNOSIS — N186 End stage renal disease: Secondary | ICD-10-CM | POA: Diagnosis not present

## 2020-11-22 DIAGNOSIS — N186 End stage renal disease: Secondary | ICD-10-CM | POA: Diagnosis not present

## 2020-11-22 DIAGNOSIS — N2581 Secondary hyperparathyroidism of renal origin: Secondary | ICD-10-CM | POA: Diagnosis not present

## 2020-11-22 DIAGNOSIS — Z992 Dependence on renal dialysis: Secondary | ICD-10-CM | POA: Diagnosis not present

## 2020-11-22 DIAGNOSIS — E8779 Other fluid overload: Secondary | ICD-10-CM | POA: Diagnosis not present

## 2020-11-22 DIAGNOSIS — D509 Iron deficiency anemia, unspecified: Secondary | ICD-10-CM | POA: Diagnosis not present

## 2020-11-22 DIAGNOSIS — D631 Anemia in chronic kidney disease: Secondary | ICD-10-CM | POA: Diagnosis not present

## 2020-11-24 DIAGNOSIS — N2581 Secondary hyperparathyroidism of renal origin: Secondary | ICD-10-CM | POA: Diagnosis not present

## 2020-11-24 DIAGNOSIS — Z992 Dependence on renal dialysis: Secondary | ICD-10-CM | POA: Diagnosis not present

## 2020-11-24 DIAGNOSIS — D631 Anemia in chronic kidney disease: Secondary | ICD-10-CM | POA: Diagnosis not present

## 2020-11-24 DIAGNOSIS — N186 End stage renal disease: Secondary | ICD-10-CM | POA: Diagnosis not present

## 2020-11-24 DIAGNOSIS — E8779 Other fluid overload: Secondary | ICD-10-CM | POA: Diagnosis not present

## 2020-11-24 DIAGNOSIS — D509 Iron deficiency anemia, unspecified: Secondary | ICD-10-CM | POA: Diagnosis not present

## 2020-11-26 DIAGNOSIS — N186 End stage renal disease: Secondary | ICD-10-CM | POA: Diagnosis not present

## 2020-11-26 DIAGNOSIS — Z992 Dependence on renal dialysis: Secondary | ICD-10-CM | POA: Diagnosis not present

## 2020-11-26 DIAGNOSIS — N2581 Secondary hyperparathyroidism of renal origin: Secondary | ICD-10-CM | POA: Diagnosis not present

## 2020-11-26 DIAGNOSIS — E8779 Other fluid overload: Secondary | ICD-10-CM | POA: Diagnosis not present

## 2020-11-26 DIAGNOSIS — D631 Anemia in chronic kidney disease: Secondary | ICD-10-CM | POA: Diagnosis not present

## 2020-11-26 DIAGNOSIS — D509 Iron deficiency anemia, unspecified: Secondary | ICD-10-CM | POA: Diagnosis not present

## 2020-11-29 DIAGNOSIS — N186 End stage renal disease: Secondary | ICD-10-CM | POA: Diagnosis not present

## 2020-11-29 DIAGNOSIS — D631 Anemia in chronic kidney disease: Secondary | ICD-10-CM | POA: Diagnosis not present

## 2020-11-29 DIAGNOSIS — D509 Iron deficiency anemia, unspecified: Secondary | ICD-10-CM | POA: Diagnosis not present

## 2020-11-29 DIAGNOSIS — E8779 Other fluid overload: Secondary | ICD-10-CM | POA: Diagnosis not present

## 2020-11-29 DIAGNOSIS — Z992 Dependence on renal dialysis: Secondary | ICD-10-CM | POA: Diagnosis not present

## 2020-11-29 DIAGNOSIS — N2581 Secondary hyperparathyroidism of renal origin: Secondary | ICD-10-CM | POA: Diagnosis not present

## 2020-12-01 DIAGNOSIS — D509 Iron deficiency anemia, unspecified: Secondary | ICD-10-CM | POA: Diagnosis not present

## 2020-12-01 DIAGNOSIS — E8779 Other fluid overload: Secondary | ICD-10-CM | POA: Diagnosis not present

## 2020-12-01 DIAGNOSIS — Z992 Dependence on renal dialysis: Secondary | ICD-10-CM | POA: Diagnosis not present

## 2020-12-01 DIAGNOSIS — N186 End stage renal disease: Secondary | ICD-10-CM | POA: Diagnosis not present

## 2020-12-01 DIAGNOSIS — N2581 Secondary hyperparathyroidism of renal origin: Secondary | ICD-10-CM | POA: Diagnosis not present

## 2020-12-01 DIAGNOSIS — D631 Anemia in chronic kidney disease: Secondary | ICD-10-CM | POA: Diagnosis not present

## 2020-12-03 DIAGNOSIS — Z992 Dependence on renal dialysis: Secondary | ICD-10-CM | POA: Diagnosis not present

## 2020-12-03 DIAGNOSIS — N186 End stage renal disease: Secondary | ICD-10-CM | POA: Diagnosis not present

## 2020-12-03 DIAGNOSIS — E8779 Other fluid overload: Secondary | ICD-10-CM | POA: Diagnosis not present

## 2020-12-03 DIAGNOSIS — D509 Iron deficiency anemia, unspecified: Secondary | ICD-10-CM | POA: Diagnosis not present

## 2020-12-03 DIAGNOSIS — D631 Anemia in chronic kidney disease: Secondary | ICD-10-CM | POA: Diagnosis not present

## 2020-12-03 DIAGNOSIS — N2581 Secondary hyperparathyroidism of renal origin: Secondary | ICD-10-CM | POA: Diagnosis not present

## 2020-12-06 DIAGNOSIS — Z992 Dependence on renal dialysis: Secondary | ICD-10-CM | POA: Diagnosis not present

## 2020-12-06 DIAGNOSIS — N2581 Secondary hyperparathyroidism of renal origin: Secondary | ICD-10-CM | POA: Diagnosis not present

## 2020-12-06 DIAGNOSIS — D509 Iron deficiency anemia, unspecified: Secondary | ICD-10-CM | POA: Diagnosis not present

## 2020-12-06 DIAGNOSIS — D631 Anemia in chronic kidney disease: Secondary | ICD-10-CM | POA: Diagnosis not present

## 2020-12-06 DIAGNOSIS — N186 End stage renal disease: Secondary | ICD-10-CM | POA: Diagnosis not present

## 2020-12-06 DIAGNOSIS — E8779 Other fluid overload: Secondary | ICD-10-CM | POA: Diagnosis not present

## 2020-12-07 DIAGNOSIS — Z992 Dependence on renal dialysis: Secondary | ICD-10-CM | POA: Diagnosis not present

## 2020-12-07 DIAGNOSIS — N186 End stage renal disease: Secondary | ICD-10-CM | POA: Diagnosis not present

## 2020-12-08 DIAGNOSIS — Z992 Dependence on renal dialysis: Secondary | ICD-10-CM | POA: Diagnosis not present

## 2020-12-08 DIAGNOSIS — D631 Anemia in chronic kidney disease: Secondary | ICD-10-CM | POA: Diagnosis not present

## 2020-12-08 DIAGNOSIS — D509 Iron deficiency anemia, unspecified: Secondary | ICD-10-CM | POA: Diagnosis not present

## 2020-12-08 DIAGNOSIS — N186 End stage renal disease: Secondary | ICD-10-CM | POA: Diagnosis not present

## 2020-12-09 ENCOUNTER — Telehealth: Payer: Self-pay | Admitting: Nurse Practitioner

## 2020-12-09 NOTE — Telephone Encounter (Signed)
Copied from [place] [id number]. Topic: Medicare AWV >> Dec 09, 2020  2:42 PM Cher Nakai R wrote: Reason for CRM:  Left message for patient to call back and schedule the Medicare Annual Wellness Visit (AWV) virtually or by telephone.  Last AWV 09/17/2019  Please schedule at anytime with CFP-Nurse Health Advisor.  45 minute appointment  Any questions, please call me at [phone]

## 2020-12-10 DIAGNOSIS — N186 End stage renal disease: Secondary | ICD-10-CM | POA: Diagnosis not present

## 2020-12-10 DIAGNOSIS — D509 Iron deficiency anemia, unspecified: Secondary | ICD-10-CM | POA: Diagnosis not present

## 2020-12-10 DIAGNOSIS — Z992 Dependence on renal dialysis: Secondary | ICD-10-CM | POA: Diagnosis not present

## 2020-12-10 DIAGNOSIS — D631 Anemia in chronic kidney disease: Secondary | ICD-10-CM | POA: Diagnosis not present

## 2020-12-13 DIAGNOSIS — D631 Anemia in chronic kidney disease: Secondary | ICD-10-CM | POA: Diagnosis not present

## 2020-12-13 DIAGNOSIS — Z992 Dependence on renal dialysis: Secondary | ICD-10-CM | POA: Diagnosis not present

## 2020-12-13 DIAGNOSIS — N186 End stage renal disease: Secondary | ICD-10-CM | POA: Diagnosis not present

## 2020-12-13 DIAGNOSIS — D509 Iron deficiency anemia, unspecified: Secondary | ICD-10-CM | POA: Diagnosis not present

## 2020-12-14 ENCOUNTER — Other Ambulatory Visit: Payer: Self-pay | Admitting: Nurse Practitioner

## 2020-12-14 ENCOUNTER — Other Ambulatory Visit: Payer: Self-pay | Admitting: Family Medicine

## 2020-12-14 NOTE — Telephone Encounter (Signed)
  Requesting a 90 day supply   Requested Prescriptions  Pending Prescriptions Disp Refills   albuterol (VENTOLIN HFA) 108 (90 Base) MCG/ACT inhaler [Pharmacy Med Name: ALBUTEROL HFA INH(200 PUFFS)18GM] 54 g     Sig: INHALE 2 PUFFS INTO THE LUNGS EVERY 6 HOURS AS NEEDED FOR WHEEZING OR SHORTNESS OF BREATH      Pulmonology:  Beta Agonists Failed - 12/14/2020  7:51 AM      Failed - One inhaler should last at least one month. If the patient is requesting refills earlier, contact the patient to check for uncontrolled symptoms.      Passed - Valid encounter within last 12 months    Recent Outpatient Visits           1 month ago Cough   [place], Lauren A, NP   4 months ago Type 2 diabetes mellitus with hyperglycemia, without long-term current use of insulin ([place])   [place], Megan P, DO   6 months ago Pneumonia of left lower lobe due to infectious organism   [place], Megan P, DO   7 months ago Pneumonia of left lower lobe due to infectious organism   [facility], Megan P, DO   7 months ago Type 2 diabetes mellitus with hyperglycemia, without long-term current use of insulin ([place])   [place], [place], DO

## 2020-12-15 DIAGNOSIS — Z992 Dependence on renal dialysis: Secondary | ICD-10-CM | POA: Diagnosis not present

## 2020-12-15 DIAGNOSIS — D509 Iron deficiency anemia, unspecified: Secondary | ICD-10-CM | POA: Diagnosis not present

## 2020-12-15 DIAGNOSIS — D631 Anemia in chronic kidney disease: Secondary | ICD-10-CM | POA: Diagnosis not present

## 2020-12-15 DIAGNOSIS — N186 End stage renal disease: Secondary | ICD-10-CM | POA: Diagnosis not present

## 2020-12-15 NOTE — Telephone Encounter (Signed)
Would pt need apt? 

## 2020-12-15 NOTE — Telephone Encounter (Signed)
Routing to provider  

## 2020-12-17 DIAGNOSIS — Z992 Dependence on renal dialysis: Secondary | ICD-10-CM | POA: Diagnosis not present

## 2020-12-17 DIAGNOSIS — D509 Iron deficiency anemia, unspecified: Secondary | ICD-10-CM | POA: Diagnosis not present

## 2020-12-17 DIAGNOSIS — D631 Anemia in chronic kidney disease: Secondary | ICD-10-CM | POA: Diagnosis not present

## 2020-12-17 DIAGNOSIS — N186 End stage renal disease: Secondary | ICD-10-CM | POA: Diagnosis not present

## 2020-12-20 DIAGNOSIS — D509 Iron deficiency anemia, unspecified: Secondary | ICD-10-CM | POA: Diagnosis not present

## 2020-12-20 DIAGNOSIS — Z992 Dependence on renal dialysis: Secondary | ICD-10-CM | POA: Diagnosis not present

## 2020-12-20 DIAGNOSIS — D631 Anemia in chronic kidney disease: Secondary | ICD-10-CM | POA: Diagnosis not present

## 2020-12-20 DIAGNOSIS — N186 End stage renal disease: Secondary | ICD-10-CM | POA: Diagnosis not present

## 2020-12-22 DIAGNOSIS — D631 Anemia in chronic kidney disease: Secondary | ICD-10-CM | POA: Diagnosis not present

## 2020-12-22 DIAGNOSIS — L851 Acquired keratosis [keratoderma] palmaris et plantaris: Secondary | ICD-10-CM | POA: Diagnosis not present

## 2020-12-22 DIAGNOSIS — N186 End stage renal disease: Secondary | ICD-10-CM | POA: Diagnosis not present

## 2020-12-22 DIAGNOSIS — B351 Tinea unguium: Secondary | ICD-10-CM | POA: Diagnosis not present

## 2020-12-22 DIAGNOSIS — D509 Iron deficiency anemia, unspecified: Secondary | ICD-10-CM | POA: Diagnosis not present

## 2020-12-22 DIAGNOSIS — E1140 Type 2 diabetes mellitus with diabetic neuropathy, unspecified: Secondary | ICD-10-CM | POA: Diagnosis not present

## 2020-12-22 DIAGNOSIS — Z794 Long term (current) use of insulin: Secondary | ICD-10-CM | POA: Diagnosis not present

## 2020-12-22 DIAGNOSIS — Z992 Dependence on renal dialysis: Secondary | ICD-10-CM | POA: Diagnosis not present

## 2020-12-24 DIAGNOSIS — D631 Anemia in chronic kidney disease: Secondary | ICD-10-CM | POA: Diagnosis not present

## 2020-12-24 DIAGNOSIS — N186 End stage renal disease: Secondary | ICD-10-CM | POA: Diagnosis not present

## 2020-12-24 DIAGNOSIS — Z992 Dependence on renal dialysis: Secondary | ICD-10-CM | POA: Diagnosis not present

## 2020-12-24 DIAGNOSIS — D509 Iron deficiency anemia, unspecified: Secondary | ICD-10-CM | POA: Diagnosis not present

## 2020-12-27 DIAGNOSIS — D509 Iron deficiency anemia, unspecified: Secondary | ICD-10-CM | POA: Diagnosis not present

## 2020-12-27 DIAGNOSIS — Z992 Dependence on renal dialysis: Secondary | ICD-10-CM | POA: Diagnosis not present

## 2020-12-27 DIAGNOSIS — D631 Anemia in chronic kidney disease: Secondary | ICD-10-CM | POA: Diagnosis not present

## 2020-12-27 DIAGNOSIS — N186 End stage renal disease: Secondary | ICD-10-CM | POA: Diagnosis not present

## 2020-12-29 DIAGNOSIS — D509 Iron deficiency anemia, unspecified: Secondary | ICD-10-CM | POA: Diagnosis not present

## 2020-12-29 DIAGNOSIS — Z992 Dependence on renal dialysis: Secondary | ICD-10-CM | POA: Diagnosis not present

## 2020-12-29 DIAGNOSIS — D631 Anemia in chronic kidney disease: Secondary | ICD-10-CM | POA: Diagnosis not present

## 2020-12-29 DIAGNOSIS — N186 End stage renal disease: Secondary | ICD-10-CM | POA: Diagnosis not present

## 2020-12-31 DIAGNOSIS — N186 End stage renal disease: Secondary | ICD-10-CM | POA: Diagnosis not present

## 2020-12-31 DIAGNOSIS — Z992 Dependence on renal dialysis: Secondary | ICD-10-CM | POA: Diagnosis not present

## 2020-12-31 DIAGNOSIS — D631 Anemia in chronic kidney disease: Secondary | ICD-10-CM | POA: Diagnosis not present

## 2020-12-31 DIAGNOSIS — D509 Iron deficiency anemia, unspecified: Secondary | ICD-10-CM | POA: Diagnosis not present

## 2021-01-03 DIAGNOSIS — Z992 Dependence on renal dialysis: Secondary | ICD-10-CM | POA: Diagnosis not present

## 2021-01-03 DIAGNOSIS — D631 Anemia in chronic kidney disease: Secondary | ICD-10-CM | POA: Diagnosis not present

## 2021-01-03 DIAGNOSIS — N186 End stage renal disease: Secondary | ICD-10-CM | POA: Diagnosis not present

## 2021-01-03 DIAGNOSIS — D509 Iron deficiency anemia, unspecified: Secondary | ICD-10-CM | POA: Diagnosis not present

## 2021-01-05 DIAGNOSIS — Z992 Dependence on renal dialysis: Secondary | ICD-10-CM | POA: Diagnosis not present

## 2021-01-05 DIAGNOSIS — D509 Iron deficiency anemia, unspecified: Secondary | ICD-10-CM | POA: Diagnosis not present

## 2021-01-05 DIAGNOSIS — D631 Anemia in chronic kidney disease: Secondary | ICD-10-CM | POA: Diagnosis not present

## 2021-01-05 DIAGNOSIS — N186 End stage renal disease: Secondary | ICD-10-CM | POA: Diagnosis not present

## 2021-01-06 DIAGNOSIS — N186 End stage renal disease: Secondary | ICD-10-CM | POA: Diagnosis not present

## 2021-01-06 DIAGNOSIS — Z992 Dependence on renal dialysis: Secondary | ICD-10-CM | POA: Diagnosis not present

## 2021-01-07 DIAGNOSIS — N186 End stage renal disease: Secondary | ICD-10-CM | POA: Diagnosis not present

## 2021-01-07 DIAGNOSIS — D509 Iron deficiency anemia, unspecified: Secondary | ICD-10-CM | POA: Diagnosis not present

## 2021-01-07 DIAGNOSIS — D631 Anemia in chronic kidney disease: Secondary | ICD-10-CM | POA: Diagnosis not present

## 2021-01-07 DIAGNOSIS — N2581 Secondary hyperparathyroidism of renal origin: Secondary | ICD-10-CM | POA: Diagnosis not present

## 2021-01-07 DIAGNOSIS — Z992 Dependence on renal dialysis: Secondary | ICD-10-CM | POA: Diagnosis not present

## 2021-01-10 DIAGNOSIS — Z992 Dependence on renal dialysis: Secondary | ICD-10-CM | POA: Diagnosis not present

## 2021-01-10 DIAGNOSIS — N2581 Secondary hyperparathyroidism of renal origin: Secondary | ICD-10-CM | POA: Diagnosis not present

## 2021-01-10 DIAGNOSIS — N186 End stage renal disease: Secondary | ICD-10-CM | POA: Diagnosis not present

## 2021-01-10 DIAGNOSIS — D509 Iron deficiency anemia, unspecified: Secondary | ICD-10-CM | POA: Diagnosis not present

## 2021-01-10 DIAGNOSIS — D631 Anemia in chronic kidney disease: Secondary | ICD-10-CM | POA: Diagnosis not present

## 2021-01-12 DIAGNOSIS — N186 End stage renal disease: Secondary | ICD-10-CM | POA: Diagnosis not present

## 2021-01-12 DIAGNOSIS — N2581 Secondary hyperparathyroidism of renal origin: Secondary | ICD-10-CM | POA: Diagnosis not present

## 2021-01-12 DIAGNOSIS — D631 Anemia in chronic kidney disease: Secondary | ICD-10-CM | POA: Diagnosis not present

## 2021-01-12 DIAGNOSIS — D509 Iron deficiency anemia, unspecified: Secondary | ICD-10-CM | POA: Diagnosis not present

## 2021-01-12 DIAGNOSIS — Z992 Dependence on renal dialysis: Secondary | ICD-10-CM | POA: Diagnosis not present

## 2021-01-14 DIAGNOSIS — D509 Iron deficiency anemia, unspecified: Secondary | ICD-10-CM | POA: Diagnosis not present

## 2021-01-14 DIAGNOSIS — D631 Anemia in chronic kidney disease: Secondary | ICD-10-CM | POA: Diagnosis not present

## 2021-01-14 DIAGNOSIS — N2581 Secondary hyperparathyroidism of renal origin: Secondary | ICD-10-CM | POA: Diagnosis not present

## 2021-01-14 DIAGNOSIS — Z992 Dependence on renal dialysis: Secondary | ICD-10-CM | POA: Diagnosis not present

## 2021-01-14 DIAGNOSIS — N186 End stage renal disease: Secondary | ICD-10-CM | POA: Diagnosis not present

## 2021-01-17 DIAGNOSIS — D631 Anemia in chronic kidney disease: Secondary | ICD-10-CM | POA: Diagnosis not present

## 2021-01-17 DIAGNOSIS — N186 End stage renal disease: Secondary | ICD-10-CM | POA: Diagnosis not present

## 2021-01-17 DIAGNOSIS — D509 Iron deficiency anemia, unspecified: Secondary | ICD-10-CM | POA: Diagnosis not present

## 2021-01-17 DIAGNOSIS — Z992 Dependence on renal dialysis: Secondary | ICD-10-CM | POA: Diagnosis not present

## 2021-01-17 DIAGNOSIS — N2581 Secondary hyperparathyroidism of renal origin: Secondary | ICD-10-CM | POA: Diagnosis not present

## 2021-01-17 DIAGNOSIS — E119 Type 2 diabetes mellitus without complications: Secondary | ICD-10-CM | POA: Diagnosis not present

## 2021-01-19 DIAGNOSIS — D631 Anemia in chronic kidney disease: Secondary | ICD-10-CM | POA: Diagnosis not present

## 2021-01-19 DIAGNOSIS — N2581 Secondary hyperparathyroidism of renal origin: Secondary | ICD-10-CM | POA: Diagnosis not present

## 2021-01-19 DIAGNOSIS — Z992 Dependence on renal dialysis: Secondary | ICD-10-CM | POA: Diagnosis not present

## 2021-01-19 DIAGNOSIS — D509 Iron deficiency anemia, unspecified: Secondary | ICD-10-CM | POA: Diagnosis not present

## 2021-01-19 DIAGNOSIS — N186 End stage renal disease: Secondary | ICD-10-CM | POA: Diagnosis not present

## 2021-01-20 DIAGNOSIS — N186 End stage renal disease: Secondary | ICD-10-CM | POA: Diagnosis not present

## 2021-01-20 DIAGNOSIS — E1122 Type 2 diabetes mellitus with diabetic chronic kidney disease: Secondary | ICD-10-CM | POA: Diagnosis not present

## 2021-01-20 DIAGNOSIS — Z95828 Presence of other vascular implants and grafts: Secondary | ICD-10-CM | POA: Diagnosis not present

## 2021-01-20 DIAGNOSIS — T82898A Other specified complication of vascular prosthetic devices, implants and grafts, initial encounter: Secondary | ICD-10-CM | POA: Diagnosis not present

## 2021-01-20 DIAGNOSIS — D631 Anemia in chronic kidney disease: Secondary | ICD-10-CM | POA: Diagnosis not present

## 2021-01-20 DIAGNOSIS — T82858A Stenosis of vascular prosthetic devices, implants and grafts, initial encounter: Secondary | ICD-10-CM | POA: Diagnosis not present

## 2021-01-20 DIAGNOSIS — Z992 Dependence on renal dialysis: Secondary | ICD-10-CM | POA: Diagnosis not present

## 2021-01-20 DIAGNOSIS — I771 Stricture of artery: Secondary | ICD-10-CM | POA: Diagnosis not present

## 2021-01-20 DIAGNOSIS — Z794 Long term (current) use of insulin: Secondary | ICD-10-CM | POA: Diagnosis not present

## 2021-01-20 DIAGNOSIS — I120 Hypertensive chronic kidney disease with stage 5 chronic kidney disease or end stage renal disease: Secondary | ICD-10-CM | POA: Diagnosis not present

## 2021-01-20 DIAGNOSIS — T82590A Other mechanical complication of surgically created arteriovenous fistula, initial encounter: Secondary | ICD-10-CM | POA: Diagnosis not present

## 2021-01-21 DIAGNOSIS — N186 End stage renal disease: Secondary | ICD-10-CM | POA: Diagnosis not present

## 2021-01-21 DIAGNOSIS — Z992 Dependence on renal dialysis: Secondary | ICD-10-CM | POA: Diagnosis not present

## 2021-01-21 DIAGNOSIS — D509 Iron deficiency anemia, unspecified: Secondary | ICD-10-CM | POA: Diagnosis not present

## 2021-01-21 DIAGNOSIS — D631 Anemia in chronic kidney disease: Secondary | ICD-10-CM | POA: Diagnosis not present

## 2021-01-21 DIAGNOSIS — N2581 Secondary hyperparathyroidism of renal origin: Secondary | ICD-10-CM | POA: Diagnosis not present

## 2021-01-24 DIAGNOSIS — N2581 Secondary hyperparathyroidism of renal origin: Secondary | ICD-10-CM | POA: Diagnosis not present

## 2021-01-24 DIAGNOSIS — D509 Iron deficiency anemia, unspecified: Secondary | ICD-10-CM | POA: Diagnosis not present

## 2021-01-24 DIAGNOSIS — Z992 Dependence on renal dialysis: Secondary | ICD-10-CM | POA: Diagnosis not present

## 2021-01-24 DIAGNOSIS — D631 Anemia in chronic kidney disease: Secondary | ICD-10-CM | POA: Diagnosis not present

## 2021-01-24 DIAGNOSIS — N186 End stage renal disease: Secondary | ICD-10-CM | POA: Diagnosis not present

## 2021-01-26 ENCOUNTER — Other Ambulatory Visit: Payer: Self-pay | Admitting: Family Medicine

## 2021-01-26 DIAGNOSIS — N2581 Secondary hyperparathyroidism of renal origin: Secondary | ICD-10-CM | POA: Diagnosis not present

## 2021-01-26 DIAGNOSIS — D509 Iron deficiency anemia, unspecified: Secondary | ICD-10-CM | POA: Diagnosis not present

## 2021-01-26 DIAGNOSIS — N186 End stage renal disease: Secondary | ICD-10-CM | POA: Diagnosis not present

## 2021-01-26 DIAGNOSIS — D631 Anemia in chronic kidney disease: Secondary | ICD-10-CM | POA: Diagnosis not present

## 2021-01-26 DIAGNOSIS — Z992 Dependence on renal dialysis: Secondary | ICD-10-CM | POA: Diagnosis not present

## 2021-01-26 NOTE — Telephone Encounter (Signed)
No future visit

## 2021-01-28 DIAGNOSIS — D631 Anemia in chronic kidney disease: Secondary | ICD-10-CM | POA: Diagnosis not present

## 2021-01-28 DIAGNOSIS — N186 End stage renal disease: Secondary | ICD-10-CM | POA: Diagnosis not present

## 2021-01-28 DIAGNOSIS — D509 Iron deficiency anemia, unspecified: Secondary | ICD-10-CM | POA: Diagnosis not present

## 2021-01-28 DIAGNOSIS — N2581 Secondary hyperparathyroidism of renal origin: Secondary | ICD-10-CM | POA: Diagnosis not present

## 2021-01-28 DIAGNOSIS — Z992 Dependence on renal dialysis: Secondary | ICD-10-CM | POA: Diagnosis not present

## 2021-01-31 DIAGNOSIS — N186 End stage renal disease: Secondary | ICD-10-CM | POA: Diagnosis not present

## 2021-01-31 DIAGNOSIS — Z992 Dependence on renal dialysis: Secondary | ICD-10-CM | POA: Diagnosis not present

## 2021-01-31 DIAGNOSIS — D631 Anemia in chronic kidney disease: Secondary | ICD-10-CM | POA: Diagnosis not present

## 2021-01-31 DIAGNOSIS — D509 Iron deficiency anemia, unspecified: Secondary | ICD-10-CM | POA: Diagnosis not present

## 2021-01-31 DIAGNOSIS — N2581 Secondary hyperparathyroidism of renal origin: Secondary | ICD-10-CM | POA: Diagnosis not present

## 2021-02-02 DIAGNOSIS — D509 Iron deficiency anemia, unspecified: Secondary | ICD-10-CM | POA: Diagnosis not present

## 2021-02-02 DIAGNOSIS — N2581 Secondary hyperparathyroidism of renal origin: Secondary | ICD-10-CM | POA: Diagnosis not present

## 2021-02-02 DIAGNOSIS — N186 End stage renal disease: Secondary | ICD-10-CM | POA: Diagnosis not present

## 2021-02-02 DIAGNOSIS — D631 Anemia in chronic kidney disease: Secondary | ICD-10-CM | POA: Diagnosis not present

## 2021-02-02 DIAGNOSIS — Z992 Dependence on renal dialysis: Secondary | ICD-10-CM | POA: Diagnosis not present

## 2021-02-04 DIAGNOSIS — D631 Anemia in chronic kidney disease: Secondary | ICD-10-CM | POA: Diagnosis not present

## 2021-02-04 DIAGNOSIS — Z992 Dependence on renal dialysis: Secondary | ICD-10-CM | POA: Diagnosis not present

## 2021-02-04 DIAGNOSIS — N186 End stage renal disease: Secondary | ICD-10-CM | POA: Diagnosis not present

## 2021-02-04 DIAGNOSIS — N2581 Secondary hyperparathyroidism of renal origin: Secondary | ICD-10-CM | POA: Diagnosis not present

## 2021-02-04 DIAGNOSIS — D509 Iron deficiency anemia, unspecified: Secondary | ICD-10-CM | POA: Diagnosis not present

## 2021-02-06 DIAGNOSIS — Z992 Dependence on renal dialysis: Secondary | ICD-10-CM | POA: Diagnosis not present

## 2021-02-06 DIAGNOSIS — N186 End stage renal disease: Secondary | ICD-10-CM | POA: Diagnosis not present

## 2021-02-07 DIAGNOSIS — N186 End stage renal disease: Secondary | ICD-10-CM | POA: Diagnosis not present

## 2021-02-07 DIAGNOSIS — N2581 Secondary hyperparathyroidism of renal origin: Secondary | ICD-10-CM | POA: Diagnosis not present

## 2021-02-07 DIAGNOSIS — D631 Anemia in chronic kidney disease: Secondary | ICD-10-CM | POA: Diagnosis not present

## 2021-02-07 DIAGNOSIS — Z992 Dependence on renal dialysis: Secondary | ICD-10-CM | POA: Diagnosis not present

## 2021-02-07 DIAGNOSIS — D509 Iron deficiency anemia, unspecified: Secondary | ICD-10-CM | POA: Diagnosis not present

## 2021-02-09 DIAGNOSIS — Z992 Dependence on renal dialysis: Secondary | ICD-10-CM | POA: Diagnosis not present

## 2021-02-09 DIAGNOSIS — D509 Iron deficiency anemia, unspecified: Secondary | ICD-10-CM | POA: Diagnosis not present

## 2021-02-09 DIAGNOSIS — N186 End stage renal disease: Secondary | ICD-10-CM | POA: Diagnosis not present

## 2021-02-09 DIAGNOSIS — D631 Anemia in chronic kidney disease: Secondary | ICD-10-CM | POA: Diagnosis not present

## 2021-02-09 DIAGNOSIS — N2581 Secondary hyperparathyroidism of renal origin: Secondary | ICD-10-CM | POA: Diagnosis not present

## 2021-02-11 DIAGNOSIS — N2581 Secondary hyperparathyroidism of renal origin: Secondary | ICD-10-CM | POA: Diagnosis not present

## 2021-02-11 DIAGNOSIS — D509 Iron deficiency anemia, unspecified: Secondary | ICD-10-CM | POA: Diagnosis not present

## 2021-02-11 DIAGNOSIS — Z992 Dependence on renal dialysis: Secondary | ICD-10-CM | POA: Diagnosis not present

## 2021-02-11 DIAGNOSIS — N186 End stage renal disease: Secondary | ICD-10-CM | POA: Diagnosis not present

## 2021-02-11 DIAGNOSIS — D631 Anemia in chronic kidney disease: Secondary | ICD-10-CM | POA: Diagnosis not present

## 2021-02-14 DIAGNOSIS — D631 Anemia in chronic kidney disease: Secondary | ICD-10-CM | POA: Diagnosis not present

## 2021-02-14 DIAGNOSIS — Z992 Dependence on renal dialysis: Secondary | ICD-10-CM | POA: Diagnosis not present

## 2021-02-14 DIAGNOSIS — N186 End stage renal disease: Secondary | ICD-10-CM | POA: Diagnosis not present

## 2021-02-14 DIAGNOSIS — D509 Iron deficiency anemia, unspecified: Secondary | ICD-10-CM | POA: Diagnosis not present

## 2021-02-14 DIAGNOSIS — N2581 Secondary hyperparathyroidism of renal origin: Secondary | ICD-10-CM | POA: Diagnosis not present

## 2021-02-16 DIAGNOSIS — D631 Anemia in chronic kidney disease: Secondary | ICD-10-CM | POA: Diagnosis not present

## 2021-02-16 DIAGNOSIS — N186 End stage renal disease: Secondary | ICD-10-CM | POA: Diagnosis not present

## 2021-02-16 DIAGNOSIS — N2581 Secondary hyperparathyroidism of renal origin: Secondary | ICD-10-CM | POA: Diagnosis not present

## 2021-02-16 DIAGNOSIS — Z992 Dependence on renal dialysis: Secondary | ICD-10-CM | POA: Diagnosis not present

## 2021-02-16 DIAGNOSIS — D509 Iron deficiency anemia, unspecified: Secondary | ICD-10-CM | POA: Diagnosis not present

## 2021-02-18 DIAGNOSIS — N2581 Secondary hyperparathyroidism of renal origin: Secondary | ICD-10-CM | POA: Diagnosis not present

## 2021-02-18 DIAGNOSIS — Z992 Dependence on renal dialysis: Secondary | ICD-10-CM | POA: Diagnosis not present

## 2021-02-18 DIAGNOSIS — D509 Iron deficiency anemia, unspecified: Secondary | ICD-10-CM | POA: Diagnosis not present

## 2021-02-18 DIAGNOSIS — N186 End stage renal disease: Secondary | ICD-10-CM | POA: Diagnosis not present

## 2021-02-18 DIAGNOSIS — D631 Anemia in chronic kidney disease: Secondary | ICD-10-CM | POA: Diagnosis not present

## 2021-02-21 DIAGNOSIS — N2581 Secondary hyperparathyroidism of renal origin: Secondary | ICD-10-CM | POA: Diagnosis not present

## 2021-02-21 DIAGNOSIS — Z992 Dependence on renal dialysis: Secondary | ICD-10-CM | POA: Diagnosis not present

## 2021-02-21 DIAGNOSIS — N186 End stage renal disease: Secondary | ICD-10-CM | POA: Diagnosis not present

## 2021-02-21 DIAGNOSIS — D509 Iron deficiency anemia, unspecified: Secondary | ICD-10-CM | POA: Diagnosis not present

## 2021-02-21 DIAGNOSIS — D631 Anemia in chronic kidney disease: Secondary | ICD-10-CM | POA: Diagnosis not present

## 2021-02-22 ENCOUNTER — Other Ambulatory Visit: Payer: Self-pay | Admitting: Nurse Practitioner

## 2021-02-23 DIAGNOSIS — D509 Iron deficiency anemia, unspecified: Secondary | ICD-10-CM | POA: Diagnosis not present

## 2021-02-23 DIAGNOSIS — N186 End stage renal disease: Secondary | ICD-10-CM | POA: Diagnosis not present

## 2021-02-23 DIAGNOSIS — D631 Anemia in chronic kidney disease: Secondary | ICD-10-CM | POA: Diagnosis not present

## 2021-02-23 DIAGNOSIS — Z992 Dependence on renal dialysis: Secondary | ICD-10-CM | POA: Diagnosis not present

## 2021-02-23 DIAGNOSIS — N2581 Secondary hyperparathyroidism of renal origin: Secondary | ICD-10-CM | POA: Diagnosis not present

## 2021-02-25 DIAGNOSIS — D509 Iron deficiency anemia, unspecified: Secondary | ICD-10-CM | POA: Diagnosis not present

## 2021-02-25 DIAGNOSIS — N186 End stage renal disease: Secondary | ICD-10-CM | POA: Diagnosis not present

## 2021-02-25 DIAGNOSIS — D631 Anemia in chronic kidney disease: Secondary | ICD-10-CM | POA: Diagnosis not present

## 2021-02-25 DIAGNOSIS — N2581 Secondary hyperparathyroidism of renal origin: Secondary | ICD-10-CM | POA: Diagnosis not present

## 2021-02-25 DIAGNOSIS — Z992 Dependence on renal dialysis: Secondary | ICD-10-CM | POA: Diagnosis not present

## 2021-02-28 DIAGNOSIS — Z992 Dependence on renal dialysis: Secondary | ICD-10-CM | POA: Diagnosis not present

## 2021-02-28 DIAGNOSIS — N2581 Secondary hyperparathyroidism of renal origin: Secondary | ICD-10-CM | POA: Diagnosis not present

## 2021-02-28 DIAGNOSIS — D631 Anemia in chronic kidney disease: Secondary | ICD-10-CM | POA: Diagnosis not present

## 2021-02-28 DIAGNOSIS — N186 End stage renal disease: Secondary | ICD-10-CM | POA: Diagnosis not present

## 2021-02-28 DIAGNOSIS — D509 Iron deficiency anemia, unspecified: Secondary | ICD-10-CM | POA: Diagnosis not present

## 2021-03-02 DIAGNOSIS — Z992 Dependence on renal dialysis: Secondary | ICD-10-CM | POA: Diagnosis not present

## 2021-03-02 DIAGNOSIS — N186 End stage renal disease: Secondary | ICD-10-CM | POA: Diagnosis not present

## 2021-03-02 DIAGNOSIS — N2581 Secondary hyperparathyroidism of renal origin: Secondary | ICD-10-CM | POA: Diagnosis not present

## 2021-03-02 DIAGNOSIS — D509 Iron deficiency anemia, unspecified: Secondary | ICD-10-CM | POA: Diagnosis not present

## 2021-03-02 DIAGNOSIS — D631 Anemia in chronic kidney disease: Secondary | ICD-10-CM | POA: Diagnosis not present

## 2021-03-04 DIAGNOSIS — Z992 Dependence on renal dialysis: Secondary | ICD-10-CM | POA: Diagnosis not present

## 2021-03-04 DIAGNOSIS — N2581 Secondary hyperparathyroidism of renal origin: Secondary | ICD-10-CM | POA: Diagnosis not present

## 2021-03-04 DIAGNOSIS — D509 Iron deficiency anemia, unspecified: Secondary | ICD-10-CM | POA: Diagnosis not present

## 2021-03-04 DIAGNOSIS — N186 End stage renal disease: Secondary | ICD-10-CM | POA: Diagnosis not present

## 2021-03-04 DIAGNOSIS — D631 Anemia in chronic kidney disease: Secondary | ICD-10-CM | POA: Diagnosis not present

## 2021-03-07 DIAGNOSIS — Z992 Dependence on renal dialysis: Secondary | ICD-10-CM | POA: Diagnosis not present

## 2021-03-07 DIAGNOSIS — D509 Iron deficiency anemia, unspecified: Secondary | ICD-10-CM | POA: Diagnosis not present

## 2021-03-07 DIAGNOSIS — N186 End stage renal disease: Secondary | ICD-10-CM | POA: Diagnosis not present

## 2021-03-07 DIAGNOSIS — D631 Anemia in chronic kidney disease: Secondary | ICD-10-CM | POA: Diagnosis not present

## 2021-03-07 DIAGNOSIS — N2581 Secondary hyperparathyroidism of renal origin: Secondary | ICD-10-CM | POA: Diagnosis not present

## 2021-03-09 DIAGNOSIS — N2581 Secondary hyperparathyroidism of renal origin: Secondary | ICD-10-CM | POA: Diagnosis not present

## 2021-03-09 DIAGNOSIS — N186 End stage renal disease: Secondary | ICD-10-CM | POA: Diagnosis not present

## 2021-03-09 DIAGNOSIS — Z992 Dependence on renal dialysis: Secondary | ICD-10-CM | POA: Diagnosis not present

## 2021-03-09 DIAGNOSIS — D509 Iron deficiency anemia, unspecified: Secondary | ICD-10-CM | POA: Diagnosis not present

## 2021-03-09 DIAGNOSIS — D631 Anemia in chronic kidney disease: Secondary | ICD-10-CM | POA: Diagnosis not present

## 2021-03-11 DIAGNOSIS — D631 Anemia in chronic kidney disease: Secondary | ICD-10-CM | POA: Diagnosis not present

## 2021-03-11 DIAGNOSIS — D509 Iron deficiency anemia, unspecified: Secondary | ICD-10-CM | POA: Diagnosis not present

## 2021-03-11 DIAGNOSIS — N2581 Secondary hyperparathyroidism of renal origin: Secondary | ICD-10-CM | POA: Diagnosis not present

## 2021-03-11 DIAGNOSIS — Z992 Dependence on renal dialysis: Secondary | ICD-10-CM | POA: Diagnosis not present

## 2021-03-11 DIAGNOSIS — N186 End stage renal disease: Secondary | ICD-10-CM | POA: Diagnosis not present

## 2021-03-14 DIAGNOSIS — N2581 Secondary hyperparathyroidism of renal origin: Secondary | ICD-10-CM | POA: Diagnosis not present

## 2021-03-14 DIAGNOSIS — D509 Iron deficiency anemia, unspecified: Secondary | ICD-10-CM | POA: Diagnosis not present

## 2021-03-14 DIAGNOSIS — D631 Anemia in chronic kidney disease: Secondary | ICD-10-CM | POA: Diagnosis not present

## 2021-03-14 DIAGNOSIS — N186 End stage renal disease: Secondary | ICD-10-CM | POA: Diagnosis not present

## 2021-03-14 DIAGNOSIS — Z992 Dependence on renal dialysis: Secondary | ICD-10-CM | POA: Diagnosis not present

## 2021-03-16 DIAGNOSIS — D631 Anemia in chronic kidney disease: Secondary | ICD-10-CM | POA: Diagnosis not present

## 2021-03-16 DIAGNOSIS — Z992 Dependence on renal dialysis: Secondary | ICD-10-CM | POA: Diagnosis not present

## 2021-03-16 DIAGNOSIS — N2581 Secondary hyperparathyroidism of renal origin: Secondary | ICD-10-CM | POA: Diagnosis not present

## 2021-03-16 DIAGNOSIS — D509 Iron deficiency anemia, unspecified: Secondary | ICD-10-CM | POA: Diagnosis not present

## 2021-03-16 DIAGNOSIS — N186 End stage renal disease: Secondary | ICD-10-CM | POA: Diagnosis not present

## 2021-03-18 DIAGNOSIS — D631 Anemia in chronic kidney disease: Secondary | ICD-10-CM | POA: Diagnosis not present

## 2021-03-18 DIAGNOSIS — N186 End stage renal disease: Secondary | ICD-10-CM | POA: Diagnosis not present

## 2021-03-18 DIAGNOSIS — D509 Iron deficiency anemia, unspecified: Secondary | ICD-10-CM | POA: Diagnosis not present

## 2021-03-18 DIAGNOSIS — Z992 Dependence on renal dialysis: Secondary | ICD-10-CM | POA: Diagnosis not present

## 2021-03-18 DIAGNOSIS — N2581 Secondary hyperparathyroidism of renal origin: Secondary | ICD-10-CM | POA: Diagnosis not present

## 2021-03-21 DIAGNOSIS — N2581 Secondary hyperparathyroidism of renal origin: Secondary | ICD-10-CM | POA: Diagnosis not present

## 2021-03-21 DIAGNOSIS — D509 Iron deficiency anemia, unspecified: Secondary | ICD-10-CM | POA: Diagnosis not present

## 2021-03-21 DIAGNOSIS — Z992 Dependence on renal dialysis: Secondary | ICD-10-CM | POA: Diagnosis not present

## 2021-03-21 DIAGNOSIS — D631 Anemia in chronic kidney disease: Secondary | ICD-10-CM | POA: Diagnosis not present

## 2021-03-21 DIAGNOSIS — N186 End stage renal disease: Secondary | ICD-10-CM | POA: Diagnosis not present

## 2021-03-23 DIAGNOSIS — D509 Iron deficiency anemia, unspecified: Secondary | ICD-10-CM | POA: Diagnosis not present

## 2021-03-23 DIAGNOSIS — Z992 Dependence on renal dialysis: Secondary | ICD-10-CM | POA: Diagnosis not present

## 2021-03-23 DIAGNOSIS — N2581 Secondary hyperparathyroidism of renal origin: Secondary | ICD-10-CM | POA: Diagnosis not present

## 2021-03-23 DIAGNOSIS — N186 End stage renal disease: Secondary | ICD-10-CM | POA: Diagnosis not present

## 2021-03-23 DIAGNOSIS — D631 Anemia in chronic kidney disease: Secondary | ICD-10-CM | POA: Diagnosis not present

## 2021-03-25 DIAGNOSIS — N186 End stage renal disease: Secondary | ICD-10-CM | POA: Diagnosis not present

## 2021-03-25 DIAGNOSIS — D509 Iron deficiency anemia, unspecified: Secondary | ICD-10-CM | POA: Diagnosis not present

## 2021-03-25 DIAGNOSIS — Z992 Dependence on renal dialysis: Secondary | ICD-10-CM | POA: Diagnosis not present

## 2021-03-25 DIAGNOSIS — N2581 Secondary hyperparathyroidism of renal origin: Secondary | ICD-10-CM | POA: Diagnosis not present

## 2021-03-25 DIAGNOSIS — D631 Anemia in chronic kidney disease: Secondary | ICD-10-CM | POA: Diagnosis not present

## 2021-03-28 DIAGNOSIS — Z992 Dependence on renal dialysis: Secondary | ICD-10-CM | POA: Diagnosis not present

## 2021-03-28 DIAGNOSIS — D509 Iron deficiency anemia, unspecified: Secondary | ICD-10-CM | POA: Diagnosis not present

## 2021-03-28 DIAGNOSIS — N2581 Secondary hyperparathyroidism of renal origin: Secondary | ICD-10-CM | POA: Diagnosis not present

## 2021-03-28 DIAGNOSIS — D631 Anemia in chronic kidney disease: Secondary | ICD-10-CM | POA: Diagnosis not present

## 2021-03-28 DIAGNOSIS — N186 End stage renal disease: Secondary | ICD-10-CM | POA: Diagnosis not present

## 2021-03-30 DIAGNOSIS — D631 Anemia in chronic kidney disease: Secondary | ICD-10-CM | POA: Diagnosis not present

## 2021-03-30 DIAGNOSIS — N186 End stage renal disease: Secondary | ICD-10-CM | POA: Diagnosis not present

## 2021-03-30 DIAGNOSIS — Z992 Dependence on renal dialysis: Secondary | ICD-10-CM | POA: Diagnosis not present

## 2021-03-30 DIAGNOSIS — D509 Iron deficiency anemia, unspecified: Secondary | ICD-10-CM | POA: Diagnosis not present

## 2021-03-30 DIAGNOSIS — N2581 Secondary hyperparathyroidism of renal origin: Secondary | ICD-10-CM | POA: Diagnosis not present

## 2021-03-31 ENCOUNTER — Ambulatory Visit (INDEPENDENT_AMBULATORY_CARE_PROVIDER_SITE_OTHER): Payer: Medicare Other | Admitting: Vascular Surgery

## 2021-03-31 ENCOUNTER — Encounter (INDEPENDENT_AMBULATORY_CARE_PROVIDER_SITE_OTHER): Payer: Medicare Other

## 2021-04-01 DIAGNOSIS — Z992 Dependence on renal dialysis: Secondary | ICD-10-CM | POA: Diagnosis not present

## 2021-04-01 DIAGNOSIS — N2581 Secondary hyperparathyroidism of renal origin: Secondary | ICD-10-CM | POA: Diagnosis not present

## 2021-04-01 DIAGNOSIS — D509 Iron deficiency anemia, unspecified: Secondary | ICD-10-CM | POA: Diagnosis not present

## 2021-04-01 DIAGNOSIS — D631 Anemia in chronic kidney disease: Secondary | ICD-10-CM | POA: Diagnosis not present

## 2021-04-01 DIAGNOSIS — N186 End stage renal disease: Secondary | ICD-10-CM | POA: Diagnosis not present

## 2021-04-04 DIAGNOSIS — D631 Anemia in chronic kidney disease: Secondary | ICD-10-CM | POA: Diagnosis not present

## 2021-04-04 DIAGNOSIS — N186 End stage renal disease: Secondary | ICD-10-CM | POA: Diagnosis not present

## 2021-04-04 DIAGNOSIS — Z992 Dependence on renal dialysis: Secondary | ICD-10-CM | POA: Diagnosis not present

## 2021-04-04 DIAGNOSIS — N2581 Secondary hyperparathyroidism of renal origin: Secondary | ICD-10-CM | POA: Diagnosis not present

## 2021-04-04 DIAGNOSIS — D509 Iron deficiency anemia, unspecified: Secondary | ICD-10-CM | POA: Diagnosis not present

## 2021-04-06 DIAGNOSIS — N186 End stage renal disease: Secondary | ICD-10-CM | POA: Diagnosis not present

## 2021-04-06 DIAGNOSIS — D631 Anemia in chronic kidney disease: Secondary | ICD-10-CM | POA: Diagnosis not present

## 2021-04-06 DIAGNOSIS — Z992 Dependence on renal dialysis: Secondary | ICD-10-CM | POA: Diagnosis not present

## 2021-04-06 DIAGNOSIS — N2581 Secondary hyperparathyroidism of renal origin: Secondary | ICD-10-CM | POA: Diagnosis not present

## 2021-04-06 DIAGNOSIS — D509 Iron deficiency anemia, unspecified: Secondary | ICD-10-CM | POA: Diagnosis not present

## 2021-04-08 DIAGNOSIS — N2581 Secondary hyperparathyroidism of renal origin: Secondary | ICD-10-CM | POA: Diagnosis not present

## 2021-04-08 DIAGNOSIS — Z992 Dependence on renal dialysis: Secondary | ICD-10-CM | POA: Diagnosis not present

## 2021-04-08 DIAGNOSIS — D509 Iron deficiency anemia, unspecified: Secondary | ICD-10-CM | POA: Diagnosis not present

## 2021-04-08 DIAGNOSIS — D631 Anemia in chronic kidney disease: Secondary | ICD-10-CM | POA: Diagnosis not present

## 2021-04-08 DIAGNOSIS — N186 End stage renal disease: Secondary | ICD-10-CM | POA: Diagnosis not present

## 2021-04-11 DIAGNOSIS — D509 Iron deficiency anemia, unspecified: Secondary | ICD-10-CM | POA: Diagnosis not present

## 2021-04-11 DIAGNOSIS — N186 End stage renal disease: Secondary | ICD-10-CM | POA: Diagnosis not present

## 2021-04-11 DIAGNOSIS — D631 Anemia in chronic kidney disease: Secondary | ICD-10-CM | POA: Diagnosis not present

## 2021-04-11 DIAGNOSIS — Z23 Encounter for immunization: Secondary | ICD-10-CM | POA: Diagnosis not present

## 2021-04-11 DIAGNOSIS — Z992 Dependence on renal dialysis: Secondary | ICD-10-CM | POA: Diagnosis not present

## 2021-04-11 DIAGNOSIS — N2581 Secondary hyperparathyroidism of renal origin: Secondary | ICD-10-CM | POA: Diagnosis not present

## 2021-04-13 DIAGNOSIS — Z23 Encounter for immunization: Secondary | ICD-10-CM | POA: Diagnosis not present

## 2021-04-13 DIAGNOSIS — Z992 Dependence on renal dialysis: Secondary | ICD-10-CM | POA: Diagnosis not present

## 2021-04-13 DIAGNOSIS — N2581 Secondary hyperparathyroidism of renal origin: Secondary | ICD-10-CM | POA: Diagnosis not present

## 2021-04-13 DIAGNOSIS — D509 Iron deficiency anemia, unspecified: Secondary | ICD-10-CM | POA: Diagnosis not present

## 2021-04-13 DIAGNOSIS — D631 Anemia in chronic kidney disease: Secondary | ICD-10-CM | POA: Diagnosis not present

## 2021-04-13 DIAGNOSIS — N186 End stage renal disease: Secondary | ICD-10-CM | POA: Diagnosis not present

## 2021-04-15 ENCOUNTER — Telehealth: Payer: Self-pay | Admitting: Nurse Practitioner

## 2021-04-15 DIAGNOSIS — D509 Iron deficiency anemia, unspecified: Secondary | ICD-10-CM | POA: Diagnosis not present

## 2021-04-15 DIAGNOSIS — Z23 Encounter for immunization: Secondary | ICD-10-CM | POA: Diagnosis not present

## 2021-04-15 DIAGNOSIS — Z992 Dependence on renal dialysis: Secondary | ICD-10-CM | POA: Diagnosis not present

## 2021-04-15 DIAGNOSIS — N2581 Secondary hyperparathyroidism of renal origin: Secondary | ICD-10-CM | POA: Diagnosis not present

## 2021-04-15 DIAGNOSIS — D631 Anemia in chronic kidney disease: Secondary | ICD-10-CM | POA: Diagnosis not present

## 2021-04-15 DIAGNOSIS — N186 End stage renal disease: Secondary | ICD-10-CM | POA: Diagnosis not present

## 2021-04-15 NOTE — Telephone Encounter (Signed)
Called patient's phone and phone disconnected call, followed up with a call to patient's contact Olegario Shearer but voicemail is full.  Will call again to follw up

## 2021-04-15 NOTE — Telephone Encounter (Signed)
Patient's friend, Olegario Shearer, brought in a diabetic shoe form to be completed by provider.  She has asked for the patient to be called at [phone] if there are any questions/concerns or when the form is completed.  Placed in provider's folder.

## 2021-04-18 DIAGNOSIS — N2581 Secondary hyperparathyroidism of renal origin: Secondary | ICD-10-CM | POA: Diagnosis not present

## 2021-04-18 DIAGNOSIS — D631 Anemia in chronic kidney disease: Secondary | ICD-10-CM | POA: Diagnosis not present

## 2021-04-18 DIAGNOSIS — Z23 Encounter for immunization: Secondary | ICD-10-CM | POA: Diagnosis not present

## 2021-04-18 DIAGNOSIS — E119 Type 2 diabetes mellitus without complications: Secondary | ICD-10-CM | POA: Diagnosis not present

## 2021-04-18 DIAGNOSIS — D509 Iron deficiency anemia, unspecified: Secondary | ICD-10-CM | POA: Diagnosis not present

## 2021-04-18 DIAGNOSIS — N186 End stage renal disease: Secondary | ICD-10-CM | POA: Diagnosis not present

## 2021-04-18 DIAGNOSIS — Z992 Dependence on renal dialysis: Secondary | ICD-10-CM | POA: Diagnosis not present

## 2021-04-18 NOTE — Telephone Encounter (Signed)
Lmom asking pt to call back to schedule an appt to get form completed.

## 2021-04-20 DIAGNOSIS — Z992 Dependence on renal dialysis: Secondary | ICD-10-CM | POA: Diagnosis not present

## 2021-04-20 DIAGNOSIS — D631 Anemia in chronic kidney disease: Secondary | ICD-10-CM | POA: Diagnosis not present

## 2021-04-20 DIAGNOSIS — N186 End stage renal disease: Secondary | ICD-10-CM | POA: Diagnosis not present

## 2021-04-20 DIAGNOSIS — N2581 Secondary hyperparathyroidism of renal origin: Secondary | ICD-10-CM | POA: Diagnosis not present

## 2021-04-20 DIAGNOSIS — Z23 Encounter for immunization: Secondary | ICD-10-CM | POA: Diagnosis not present

## 2021-04-20 DIAGNOSIS — D509 Iron deficiency anemia, unspecified: Secondary | ICD-10-CM | POA: Diagnosis not present

## 2021-04-20 NOTE — Telephone Encounter (Signed)
Lmom asking pt to call back to schedule an appt for form to be completed.

## 2021-04-22 DIAGNOSIS — N186 End stage renal disease: Secondary | ICD-10-CM | POA: Diagnosis not present

## 2021-04-22 DIAGNOSIS — Z992 Dependence on renal dialysis: Secondary | ICD-10-CM | POA: Diagnosis not present

## 2021-04-22 DIAGNOSIS — D509 Iron deficiency anemia, unspecified: Secondary | ICD-10-CM | POA: Diagnosis not present

## 2021-04-22 DIAGNOSIS — Z23 Encounter for immunization: Secondary | ICD-10-CM | POA: Diagnosis not present

## 2021-04-22 DIAGNOSIS — D631 Anemia in chronic kidney disease: Secondary | ICD-10-CM | POA: Diagnosis not present

## 2021-04-22 DIAGNOSIS — N2581 Secondary hyperparathyroidism of renal origin: Secondary | ICD-10-CM | POA: Diagnosis not present

## 2021-04-25 DIAGNOSIS — D631 Anemia in chronic kidney disease: Secondary | ICD-10-CM | POA: Diagnosis not present

## 2021-04-25 DIAGNOSIS — Z992 Dependence on renal dialysis: Secondary | ICD-10-CM | POA: Diagnosis not present

## 2021-04-25 DIAGNOSIS — D509 Iron deficiency anemia, unspecified: Secondary | ICD-10-CM | POA: Diagnosis not present

## 2021-04-25 DIAGNOSIS — N2581 Secondary hyperparathyroidism of renal origin: Secondary | ICD-10-CM | POA: Diagnosis not present

## 2021-04-25 DIAGNOSIS — Z23 Encounter for immunization: Secondary | ICD-10-CM | POA: Diagnosis not present

## 2021-04-25 DIAGNOSIS — N186 End stage renal disease: Secondary | ICD-10-CM | POA: Diagnosis not present

## 2021-04-27 DIAGNOSIS — Z992 Dependence on renal dialysis: Secondary | ICD-10-CM | POA: Diagnosis not present

## 2021-04-27 DIAGNOSIS — D509 Iron deficiency anemia, unspecified: Secondary | ICD-10-CM | POA: Diagnosis not present

## 2021-04-27 DIAGNOSIS — N186 End stage renal disease: Secondary | ICD-10-CM | POA: Diagnosis not present

## 2021-04-27 DIAGNOSIS — Z23 Encounter for immunization: Secondary | ICD-10-CM | POA: Diagnosis not present

## 2021-04-27 DIAGNOSIS — N2581 Secondary hyperparathyroidism of renal origin: Secondary | ICD-10-CM | POA: Diagnosis not present

## 2021-04-27 DIAGNOSIS — D631 Anemia in chronic kidney disease: Secondary | ICD-10-CM | POA: Diagnosis not present

## 2021-04-27 NOTE — Telephone Encounter (Signed)
Appointment changed patient aware

## 2021-04-27 NOTE — Telephone Encounter (Signed)
This patient is on my schedule for tomorrow. If he is coming in for diabetic shoe form it needs to be rescheduled.  He has to have this done by an MD.

## 2021-04-28 ENCOUNTER — Other Ambulatory Visit: Payer: Self-pay

## 2021-04-28 ENCOUNTER — Encounter: Payer: Self-pay | Admitting: Internal Medicine

## 2021-04-28 ENCOUNTER — Ambulatory Visit (INDEPENDENT_AMBULATORY_CARE_PROVIDER_SITE_OTHER): Payer: Medicare Other | Admitting: Internal Medicine

## 2021-04-28 ENCOUNTER — Ambulatory Visit: Payer: Medicare Other | Admitting: Nurse Practitioner

## 2021-04-28 VITALS — BP 210/110 | HR 80 | Temp 98.1°F | Ht 75.98 in | Wt 247.2 lb

## 2021-04-28 DIAGNOSIS — E1165 Type 2 diabetes mellitus with hyperglycemia: Secondary | ICD-10-CM

## 2021-04-28 DIAGNOSIS — I152 Hypertension secondary to endocrine disorders: Secondary | ICD-10-CM | POA: Diagnosis not present

## 2021-04-28 DIAGNOSIS — I5032 Chronic diastolic (congestive) heart failure: Secondary | ICD-10-CM

## 2021-04-28 DIAGNOSIS — E1159 Type 2 diabetes mellitus with other circulatory complications: Secondary | ICD-10-CM

## 2021-04-28 LAB — BAYER DCA HB A1C WAIVED: HB A1C (BAYER DCA - WAIVED): 5.1 % (ref 4.8–5.6)

## 2021-04-28 NOTE — Progress Notes (Signed)
BP (!) 210/110   Pulse 80   Temp 98.1 F (36.7 C) (Oral)   Ht 6' 3.98" (1.93 m)   Wt 247 lb 3.2 oz (112.1 kg)   SpO2 98%   BMI 30.10 kg/m    Subjective:    Patient ID: Timothy Gibson, male    DOB: 26-May-1951, 70 y.o.   MRN: [id number]  Chief Complaint  Patient presents with  . paperwork for shoes    Needs paperwork filled out for his diabetic shoes.    HPI: Timothy Gibson is a 70 y.o. male  Pt is here to get diabetic shoes paperwork per his verbal record. He has a ho DM / CKD says he always has Htn and is on Dialysis.    Chief Complaint  Patient presents with  . paperwork for shoes    Needs paperwork filled out for his diabetic shoes.    Relevant past medical, surgical, family and social history reviewed and updated as indicated. Interim medical history since our last visit reviewed. Allergies and medications reviewed and updated.  Review of Systems  Per HPI unless specifically indicated above     Objective:    BP (!) 210/110   Pulse 80   Temp 98.1 F (36.7 C) (Oral)   Ht 6' 3.98" (1.93 m)   Wt 247 lb 3.2 oz (112.1 kg)   SpO2 98%   BMI 30.10 kg/m   Wt Readings from Last 3 Encounters:  04/28/21 247 lb 3.2 oz (112.1 kg)  09/30/20 253 lb (114.8 kg)  09/01/20 247 lb (112 kg)    Physical Exam Vitals and nursing note reviewed.  Constitutional:      General: He is not in acute distress.    Appearance: Normal appearance. He is not diaphoretic.  HENT:     Right Ear: Tympanic membrane normal.  Eyes:     Conjunctiva/sclera: Conjunctivae normal.     Pupils: Pupils are equal, round, and reactive to light.  Musculoskeletal:        General: No swelling or deformity.     Cervical back: Normal range of motion. No rigidity or tenderness.     Left lower leg: No edema.  Skin:    General: Skin is warm and dry.  Neurological:     Mental Status: He is alert.  Psychiatric:        Mood and Affect: Mood normal.        Judgment: Judgment normal.    Results for  orders placed or performed in visit on 04/28/21  Lipid panel  Result Value Ref Range   Cholesterol, Total 89 (L) 100 - 199 mg/dL   Triglycerides 48 0 - 149 mg/dL   HDL 43 >39 mg/dL   VLDL Cholesterol Cal 12 5 - 40 mg/dL   LDL Chol Calc (NIH) 34 0 - 99 mg/dL   Chol/HDL Ratio 2.1 0.0 - 5.0 ratio  Basic metabolic panel  Result Value Ref Range   Glucose 89 70 - 99 mg/dL   BUN 23 8 - 27 mg/dL   Creatinine, Ser 7.59 (H) 0.76 - 1.27 mg/dL   eGFR 7 (L) >59 mL/min/1.73   BUN/Creatinine Ratio 3 (L) 10 - 24   Sodium 145 (H) 134 - 144 mmol/L   Potassium 3.8 3.5 - 5.2 mmol/L   Chloride 98 96 - 106 mmol/L   CO2 30 (H) 20 - 29 mmol/L   Calcium 9.1 8.6 - 10.2 mg/dL  Bayer DCA Hb A1c Waived  Result Value Ref  Range   HB A1C (BAYER DCA - WAIVED) 5.1 4.8 - 5.6 %        Current Outpatient Medications:  .  albuterol (VENTOLIN HFA) 108 (90 Base) MCG/ACT inhaler, INHALE 2 PUFFS INTO THE LUNGS EVERY 6 HOURS AS NEEDED FOR WHEEZING OR SHORTNESS OF BREATH, Disp: 54 g, Rfl: 1 .  amLODipine (NORVASC) 5 MG tablet, TAKE 1 TABLET(5 MG) BY MOUTH DAILY (Patient taking differently: Take 5 mg by mouth daily.), Disp: 90 tablet, Rfl: 1 .  ANORO ELLIPTA 62.5-25 MCG/INH AEPB, INHALE 1 PUFF INTO THE LUNGS DAILY, Disp: 180 each, Rfl: 1 .  fluticasone (FLONASE) 50 MCG/ACT nasal spray, SHAKE LIQUID AND USE 2 SPRAYS IN EACH NOSTRIL DAILY, Disp: 16 g, Rfl: 3 .  insulin degludec (TRESIBA FLEXTOUCH) 100 UNIT/ML FlexTouch Pen, INJECT 10 UNITS TOTAL INTO THE SKIN AT BEDTIME (Patient taking differently: Inject into the skin at bedtime.), Disp: 6 mL, Rfl: 3 .  losartan (COZAAR) 100 MG tablet, TAKE 1 TABLET(100 MG) BY MOUTH DAILY, Disp: 90 tablet, Rfl: 0 .  sevelamer carbonate (RENVELA) 800 MG tablet, Take 2,400-3,200 mg by mouth 3 (three) times daily with meals., Disp: , Rfl:  .  urea (CARMOL) 40 % ointment, Apply 1 application topically daily as needed (Dead skin)., Disp: , Rfl:     Assessment & Plan:  HTN  : Pt advised to go  to the ER if his BP continues to be high   is on HD for ESRD Will need to fu with PCP.  Will check labs today   2. DM a1c at 5.1  Is on tresiba 10 untis.will need to fu with pcp for ? Need of this.  check HbA1c,  urine  microalbumin  diabetic diet plan given to pt  adviced regarding hypoglycemia and instructions given to pt today on how to prevent and treat the same if it were to occur. pt acknowledges the plan and voices understanding of the same.  exercise plan given and encouraged.   advice diabetic yearly podiatry, ophthalmology , nutritionist , dental check q 6 months,  Problem List Items Addressed This Visit       Cardiovascular and Mediastinum   Hypertension associated with diabetes ([place]) - Primary   Relevant Orders   Bayer DCA Hb A1c Waived (Completed)   Urinalysis, Routine w reflex microscopic   Microalbumin, urine   Basic metabolic panel (Completed)   Lipid panel (Completed)   Chronic diastolic heart failure (HCC)   Relevant Orders   Bayer DCA Hb A1c Waived (Completed)   Urinalysis, Routine w reflex microscopic   Microalbumin, urine   Basic metabolic panel (Completed)   Lipid panel (Completed)     Endocrine   Type 2 diabetes mellitus with hyperglycemia (HCC)   Relevant Orders   Bayer DCA Hb A1c Waived (Completed)   Urinalysis, Routine w reflex microscopic   Microalbumin, urine   Basic metabolic panel (Completed)   Lipid panel (Completed)     Orders Placed This Encounter  Procedures  . Bayer DCA Hb A1c Waived  . Urinalysis, Routine w reflex microscopic  . Microalbumin, urine  . Basic metabolic panel  . Lipid panel     No orders of the defined types were placed in this encounter.    Follow up plan: No follow-ups on file.

## 2021-04-29 DIAGNOSIS — D509 Iron deficiency anemia, unspecified: Secondary | ICD-10-CM | POA: Diagnosis not present

## 2021-04-29 DIAGNOSIS — N186 End stage renal disease: Secondary | ICD-10-CM | POA: Diagnosis not present

## 2021-04-29 DIAGNOSIS — N2581 Secondary hyperparathyroidism of renal origin: Secondary | ICD-10-CM | POA: Diagnosis not present

## 2021-04-29 DIAGNOSIS — D631 Anemia in chronic kidney disease: Secondary | ICD-10-CM | POA: Diagnosis not present

## 2021-04-29 DIAGNOSIS — Z23 Encounter for immunization: Secondary | ICD-10-CM | POA: Diagnosis not present

## 2021-04-29 DIAGNOSIS — Z992 Dependence on renal dialysis: Secondary | ICD-10-CM | POA: Diagnosis not present

## 2021-04-29 LAB — BASIC METABOLIC PANEL
BUN/Creatinine Ratio: 3 — ABNORMAL LOW (ref 10–24)
BUN: 23 mg/dL (ref 8–27)
CO2: 30 mmol/L — ABNORMAL HIGH (ref 20–29)
Calcium: 9.1 mg/dL (ref 8.6–10.2)
Chloride: 98 mmol/L (ref 96–106)
Creatinine, Ser: 7.59 mg/dL — ABNORMAL HIGH (ref 0.76–1.27)
Glucose: 89 mg/dL (ref 70–99)
Potassium: 3.8 mmol/L (ref 3.5–5.2)
Sodium: 145 mmol/L — ABNORMAL HIGH (ref 134–144)
eGFR: 7 mL/min/{1.73_m2} — ABNORMAL LOW (ref >59)

## 2021-04-29 LAB — LIPID PANEL
Chol/HDL Ratio: 2.1 ratio (ref 0.0–5.0)
Cholesterol, Total: 89 mg/dL — ABNORMAL LOW (ref 100–199)
HDL: 43 mg/dL (ref >39)
LDL Chol Calc (NIH): 34 mg/dL (ref 0–99)
Triglycerides: 48 mg/dL (ref 0–149)
VLDL Cholesterol Cal: 12 mg/dL (ref 5–40)

## 2021-05-02 DIAGNOSIS — N2581 Secondary hyperparathyroidism of renal origin: Secondary | ICD-10-CM | POA: Diagnosis not present

## 2021-05-02 DIAGNOSIS — D509 Iron deficiency anemia, unspecified: Secondary | ICD-10-CM | POA: Diagnosis not present

## 2021-05-02 DIAGNOSIS — Z992 Dependence on renal dialysis: Secondary | ICD-10-CM | POA: Diagnosis not present

## 2021-05-02 DIAGNOSIS — N186 End stage renal disease: Secondary | ICD-10-CM | POA: Diagnosis not present

## 2021-05-02 DIAGNOSIS — D631 Anemia in chronic kidney disease: Secondary | ICD-10-CM | POA: Diagnosis not present

## 2021-05-02 DIAGNOSIS — Z23 Encounter for immunization: Secondary | ICD-10-CM | POA: Diagnosis not present

## 2021-05-03 ENCOUNTER — Other Ambulatory Visit: Payer: Self-pay | Admitting: Family Medicine

## 2021-05-03 NOTE — Telephone Encounter (Signed)
Requested Prescriptions  Pending Prescriptions Disp Refills  . losartan (COZAAR) 100 MG tablet [Pharmacy Med Name: LOSARTAN 100MG  TABLETS] 90 tablet 0    Sig: TAKE 1 TABLET(100 MG) BY MOUTH DAILY     Cardiovascular:  Angiotensin Receptor Blockers Failed - 05/03/2021  3:10 AM      Failed - Cr in normal range and within 180 days    Creatinine  Date Value Ref Range Status  08/13/2011 8.72 (H) 0.60 - 1.30 mg/dL Final   Creatinine, Ser  Date Value Ref Range Status  04/28/2021 7.59 (H) 0.76 - 1.27 mg/dL Final         Failed - Last BP in normal range    BP Readings from Last 1 Encounters:  04/28/21 (!) 210/110         Passed - K in normal range and within 180 days    Potassium  Date Value Ref Range Status  04/28/2021 3.8 3.5 - 5.2 mmol/L Final  08/13/2011 4.3 3.5 - 5.1 mmol/L Final   Potassium [facility] vascular lab)  Date Value Ref Range Status  09/01/2020 4.1 3.5 - 5.1 Final    Comment:    Performed at [facility], [address] [place], [place] [address]         [place] - Patient is not pregnant      Passed - Valid encounter within last 6 months    Recent Outpatient Visits          5 days ago Hypertension associated with diabetes ([place])   [place] Vigg, Avanti, MD   6 months ago Cough   [place], Lauren A, NP   9 months ago Type 2 diabetes mellitus with hyperglycemia, without long-term current use of insulin ([place])   [place], Megan P, DO   11 months ago Pneumonia of left lower lobe due to infectious organism   [facility], Megan P, DO   11 months ago Pneumonia of left lower lobe due to infectious organism   [place], Barb Merino, DO      Future Appointments            In 1 week Jon Billings, NP [facility], [facility]

## 2021-05-04 DIAGNOSIS — Z23 Encounter for immunization: Secondary | ICD-10-CM | POA: Diagnosis not present

## 2021-05-04 DIAGNOSIS — Z992 Dependence on renal dialysis: Secondary | ICD-10-CM | POA: Diagnosis not present

## 2021-05-04 DIAGNOSIS — N186 End stage renal disease: Secondary | ICD-10-CM | POA: Diagnosis not present

## 2021-05-04 DIAGNOSIS — N2581 Secondary hyperparathyroidism of renal origin: Secondary | ICD-10-CM | POA: Diagnosis not present

## 2021-05-04 DIAGNOSIS — D631 Anemia in chronic kidney disease: Secondary | ICD-10-CM | POA: Diagnosis not present

## 2021-05-04 DIAGNOSIS — D509 Iron deficiency anemia, unspecified: Secondary | ICD-10-CM | POA: Diagnosis not present

## 2021-05-05 DIAGNOSIS — E1165 Type 2 diabetes mellitus with hyperglycemia: Secondary | ICD-10-CM | POA: Diagnosis not present

## 2021-05-05 DIAGNOSIS — Z6841 Body Mass Index (BMI) 40.0 and over, adult: Secondary | ICD-10-CM | POA: Diagnosis not present

## 2021-05-05 DIAGNOSIS — I5032 Chronic diastolic (congestive) heart failure: Secondary | ICD-10-CM | POA: Diagnosis not present

## 2021-05-05 DIAGNOSIS — I159 Secondary hypertension, unspecified: Secondary | ICD-10-CM | POA: Diagnosis not present

## 2021-05-05 DIAGNOSIS — N186 End stage renal disease: Secondary | ICD-10-CM | POA: Diagnosis not present

## 2021-05-05 DIAGNOSIS — J449 Chronic obstructive pulmonary disease, unspecified: Secondary | ICD-10-CM | POA: Diagnosis not present

## 2021-05-05 DIAGNOSIS — E11621 Type 2 diabetes mellitus with foot ulcer: Secondary | ICD-10-CM | POA: Diagnosis not present

## 2021-05-05 DIAGNOSIS — I739 Peripheral vascular disease, unspecified: Secondary | ICD-10-CM | POA: Diagnosis not present

## 2021-05-05 DIAGNOSIS — L97524 Non-pressure chronic ulcer of other part of left foot with necrosis of bone: Secondary | ICD-10-CM | POA: Diagnosis not present

## 2021-05-05 DIAGNOSIS — Z992 Dependence on renal dialysis: Secondary | ICD-10-CM | POA: Diagnosis not present

## 2021-05-06 DIAGNOSIS — Z23 Encounter for immunization: Secondary | ICD-10-CM | POA: Diagnosis not present

## 2021-05-06 DIAGNOSIS — D509 Iron deficiency anemia, unspecified: Secondary | ICD-10-CM | POA: Diagnosis not present

## 2021-05-06 DIAGNOSIS — Z992 Dependence on renal dialysis: Secondary | ICD-10-CM | POA: Diagnosis not present

## 2021-05-06 DIAGNOSIS — D631 Anemia in chronic kidney disease: Secondary | ICD-10-CM | POA: Diagnosis not present

## 2021-05-06 DIAGNOSIS — N186 End stage renal disease: Secondary | ICD-10-CM | POA: Diagnosis not present

## 2021-05-06 DIAGNOSIS — N2581 Secondary hyperparathyroidism of renal origin: Secondary | ICD-10-CM | POA: Diagnosis not present

## 2021-05-09 DIAGNOSIS — Z23 Encounter for immunization: Secondary | ICD-10-CM | POA: Diagnosis not present

## 2021-05-09 DIAGNOSIS — N186 End stage renal disease: Secondary | ICD-10-CM | POA: Diagnosis not present

## 2021-05-09 DIAGNOSIS — D509 Iron deficiency anemia, unspecified: Secondary | ICD-10-CM | POA: Diagnosis not present

## 2021-05-09 DIAGNOSIS — D631 Anemia in chronic kidney disease: Secondary | ICD-10-CM | POA: Diagnosis not present

## 2021-05-09 DIAGNOSIS — N2581 Secondary hyperparathyroidism of renal origin: Secondary | ICD-10-CM | POA: Diagnosis not present

## 2021-05-09 DIAGNOSIS — Z992 Dependence on renal dialysis: Secondary | ICD-10-CM | POA: Diagnosis not present

## 2021-05-11 DIAGNOSIS — N2581 Secondary hyperparathyroidism of renal origin: Secondary | ICD-10-CM | POA: Diagnosis not present

## 2021-05-11 DIAGNOSIS — Z992 Dependence on renal dialysis: Secondary | ICD-10-CM | POA: Diagnosis not present

## 2021-05-11 DIAGNOSIS — N186 End stage renal disease: Secondary | ICD-10-CM | POA: Diagnosis not present

## 2021-05-11 DIAGNOSIS — D631 Anemia in chronic kidney disease: Secondary | ICD-10-CM | POA: Diagnosis not present

## 2021-05-11 DIAGNOSIS — D509 Iron deficiency anemia, unspecified: Secondary | ICD-10-CM | POA: Diagnosis not present

## 2021-05-12 ENCOUNTER — Encounter: Payer: Self-pay | Admitting: Nurse Practitioner

## 2021-05-12 ENCOUNTER — Other Ambulatory Visit: Payer: Self-pay

## 2021-05-12 ENCOUNTER — Ambulatory Visit (INDEPENDENT_AMBULATORY_CARE_PROVIDER_SITE_OTHER): Payer: Medicare Other | Admitting: Nurse Practitioner

## 2021-05-12 VITALS — BP 205/82 | HR 81 | Temp 97.3°F | Ht 76.2 in | Wt 242.2 lb

## 2021-05-12 DIAGNOSIS — E1159 Type 2 diabetes mellitus with other circulatory complications: Secondary | ICD-10-CM

## 2021-05-12 DIAGNOSIS — I70213 Atherosclerosis of native arteries of extremities with intermittent claudication, bilateral legs: Secondary | ICD-10-CM

## 2021-05-12 DIAGNOSIS — I7025 Atherosclerosis of native arteries of other extremities with ulceration: Secondary | ICD-10-CM

## 2021-05-12 DIAGNOSIS — I152 Hypertension secondary to endocrine disorders: Secondary | ICD-10-CM | POA: Diagnosis not present

## 2021-05-12 DIAGNOSIS — E1165 Type 2 diabetes mellitus with hyperglycemia: Secondary | ICD-10-CM

## 2021-05-12 NOTE — Assessment & Plan Note (Signed)
Chronic. Well controlled. Foot exam complete today. Reviewed lab results from previous visit with patient today.  Continue to manage with diet.  Follow up in 6 months.

## 2021-05-12 NOTE — Assessment & Plan Note (Signed)
Reviewed labs with patient during visit. Continue with current medication regimen.  Follow up in 6 months.  

## 2021-05-12 NOTE — Progress Notes (Signed)
BP (!) 205/82 (BP Location: Right Arm, Cuff Size: Normal)   Pulse 81   Temp (!) 97.3 F (36.3 C) (Oral)   Ht 6' 4.2" (1.935 m)   Wt 242 lb 3.2 oz (109.9 kg)   SpO2 94%   BMI 29.33 kg/m    Subjective:    Patient ID: Timothy Gibson, male    DOB: November 07, 1950, 70 y.o.   MRN: [id number]  HPI: Timothy Gibson is a 70 y.o. male  Chief Complaint  Patient presents with   Diabetes   Hypertension   HYPERTENSION Hypertension status: uncontrolled  Satisfied with current treatment? yes Duration of hypertension: years BP monitoring frequency:   checked frequently at dialysis BP range:  BP medication side effects:  no Medication compliance: excellent compliance Previous BP meds: Metoprolol and valsartan Aspirin: no Recurrent headaches: no Visual changes: no Palpitations: no Dyspnea: no Chest pain: no Lower extremity edema: no Dizzy/lightheaded: no  DIABETES Hypoglycemic episodes:no Polydipsia/polyuria: no Visual disturbance: no Chest pain: no Paresthesias: no Glucose Monitoring: no  Accucheck frequency: Not Checking  Fasting glucose:  Post prandial:  Evening:  Before meals: Taking Insulin?: no  Long acting insulin:  Short acting insulin: Blood Pressure Monitoring:  checked at dialysis Retinal Examination: Not up to Date Foot Exam: Not up to Date Diabetic Education: Not Completed Pneumovax: Up to Date Influenza: Up to Date Aspirin: no  Relevant past medical, surgical, family and social history reviewed and updated as indicated. Interim medical history since our last visit reviewed. Allergies and medications reviewed and updated.  Review of Systems  Eyes:  Negative for visual disturbance.  Respiratory:  Negative for chest tightness and shortness of breath.   Cardiovascular:  Negative for chest pain, palpitations and leg swelling.  Endocrine: Negative for polydipsia and polyuria.  Neurological:  Negative for dizziness, light-headedness, numbness and headaches.    Per HPI unless specifically indicated above     Objective:    BP (!) 205/82 (BP Location: Right Arm, Cuff Size: Normal)   Pulse 81   Temp (!) 97.3 F (36.3 C) (Oral)   Ht 6' 4.2" (1.935 m)   Wt 242 lb 3.2 oz (109.9 kg)   SpO2 94%   BMI 29.33 kg/m   Wt Readings from Last 3 Encounters:  05/12/21 242 lb 3.2 oz (109.9 kg)  04/28/21 247 lb 3.2 oz (112.1 kg)  09/30/20 253 lb (114.8 kg)    Physical Exam Vitals and nursing note reviewed.  Constitutional:      General: He is not in acute distress.    Appearance: Normal appearance. He is not ill-appearing, toxic-appearing or diaphoretic.  HENT:     Head: Normocephalic.     Right Ear: External ear normal.     Left Ear: External ear normal.     Nose: Nose normal. No congestion or rhinorrhea.     Mouth/Throat:     Mouth: Mucous membranes are moist.  Eyes:     General:        Right eye: No discharge.        Left eye: No discharge.     Extraocular Movements: Extraocular movements intact.     Conjunctiva/sclera: Conjunctivae normal.     Pupils: Pupils are equal, round, and reactive to light.  Cardiovascular:     Rate and Rhythm: Normal rate and regular rhythm.     Heart sounds: No murmur heard. Pulmonary:     Effort: Pulmonary effort is normal. No respiratory distress.     Breath sounds:  Normal breath sounds. No wheezing, rhonchi or rales.  Abdominal:     General: Abdomen is flat. Bowel sounds are normal.  Musculoskeletal:     Cervical back: Normal range of motion and neck supple.  Skin:    General: Skin is warm and dry.     Capillary Refill: Capillary refill takes less than 2 seconds.  Neurological:     General: No focal deficit present.     Mental Status: He is alert and oriented to person, place, and time.  Psychiatric:        Mood and Affect: Mood normal.        Behavior: Behavior normal.        Thought Content: Thought content normal.        Judgment: Judgment normal.    Results for orders placed or performed in  visit on 04/28/21  Lipid panel  Result Value Ref Range   Cholesterol, Total 89 (L) 100 - 199 mg/dL   Triglycerides 48 0 - 149 mg/dL   HDL 43 >39 mg/dL   VLDL Cholesterol Cal 12 5 - 40 mg/dL   LDL Chol Calc (NIH) 34 0 - 99 mg/dL   Chol/HDL Ratio 2.1 0.0 - 5.0 ratio  Basic metabolic panel  Result Value Ref Range   Glucose 89 70 - 99 mg/dL   BUN 23 8 - 27 mg/dL   Creatinine, Ser 7.59 (H) 0.76 - 1.27 mg/dL   eGFR 7 (L) >59 mL/min/1.73   BUN/Creatinine Ratio 3 (L) 10 - 24   Sodium 145 (H) 134 - 144 mmol/L   Potassium 3.8 3.5 - 5.2 mmol/L   Chloride 98 96 - 106 mmol/L   CO2 30 (H) 20 - 29 mmol/L   Calcium 9.1 8.6 - 10.2 mg/dL  Bayer DCA Hb A1c Waived  Result Value Ref Range   HB A1C (BAYER DCA - WAIVED) 5.1 4.8 - 5.6 %      Assessment & Plan:   Problem List Items Addressed This Visit       Cardiovascular and Mediastinum   Hypertension associated with diabetes ([place]) - Primary    Chronic. Not well controlled.  Patient is on dialysis.  Blood pressure is managed by Nephrology.  Continue per their recommendations.  Asymptomatic at visit today.       Relevant Medications   sacubitril-valsartan (ENTRESTO) 24-26 MG   metoprolol succinate (TOPROL-XL) 25 MG 24 hr tablet   Atherosclerosis of native arteries of the extremities with ulceration ([place])    Reviewed labs with patient during visit. Continue with current medication regimen.  Follow up in 6 months.       Relevant Medications   sacubitril-valsartan (ENTRESTO) 24-26 MG   metoprolol succinate (TOPROL-XL) 25 MG 24 hr tablet     Endocrine   Type 2 diabetes mellitus with hyperglycemia, without long-term current use of insulin (HCC)    Chronic. Well controlled. Foot exam complete today. Reviewed lab results from previous visit with patient today.  Continue to manage with diet.  Follow up in 6 months.        Follow up plan: Return in about 6 months (around 11/09/2021) for HTN, HLD, DM2 FU.   A total of 30 minutes were spent on  this encounter today.  When total time is documented, this includes both the face-to-face and non-face-to-face time personally spent before, during and after the visit on the date of the encounter reviewing lab results from previous visit, discussing blood pressure elevation and performing foot exam.

## 2021-05-12 NOTE — Assessment & Plan Note (Signed)
Chronic. Not well controlled.  Patient is on dialysis.  Blood pressure is managed by Nephrology.  Continue per their recommendations.  Asymptomatic at visit today.

## 2021-05-13 DIAGNOSIS — D509 Iron deficiency anemia, unspecified: Secondary | ICD-10-CM | POA: Diagnosis not present

## 2021-05-13 DIAGNOSIS — N186 End stage renal disease: Secondary | ICD-10-CM | POA: Diagnosis not present

## 2021-05-13 DIAGNOSIS — N2581 Secondary hyperparathyroidism of renal origin: Secondary | ICD-10-CM | POA: Diagnosis not present

## 2021-05-13 DIAGNOSIS — Z992 Dependence on renal dialysis: Secondary | ICD-10-CM | POA: Diagnosis not present

## 2021-05-13 DIAGNOSIS — D631 Anemia in chronic kidney disease: Secondary | ICD-10-CM | POA: Diagnosis not present

## 2021-05-15 DIAGNOSIS — Z20828 Contact with and (suspected) exposure to other viral communicable diseases: Secondary | ICD-10-CM | POA: Diagnosis not present

## 2021-05-16 ENCOUNTER — Telehealth: Payer: Self-pay | Admitting: Nurse Practitioner

## 2021-05-16 DIAGNOSIS — Z992 Dependence on renal dialysis: Secondary | ICD-10-CM | POA: Diagnosis not present

## 2021-05-16 DIAGNOSIS — N186 End stage renal disease: Secondary | ICD-10-CM | POA: Diagnosis not present

## 2021-05-16 DIAGNOSIS — D509 Iron deficiency anemia, unspecified: Secondary | ICD-10-CM | POA: Diagnosis not present

## 2021-05-16 DIAGNOSIS — N2581 Secondary hyperparathyroidism of renal origin: Secondary | ICD-10-CM | POA: Diagnosis not present

## 2021-05-16 DIAGNOSIS — D631 Anemia in chronic kidney disease: Secondary | ICD-10-CM | POA: Diagnosis not present

## 2021-05-16 NOTE — Telephone Encounter (Signed)
Copied from [place] [id number]. Topic: General - Other >> May 16, 2021 11:11 AM Jodie Echevaria wrote: Reason for CRM: Marcene Brawn with [employer] called in needing to speak to the nurse in regards to paperwork for supplies that patient need. May need an adendum but please call Marcene Brawn for further information at Ph# [phone]

## 2021-05-18 DIAGNOSIS — D509 Iron deficiency anemia, unspecified: Secondary | ICD-10-CM | POA: Diagnosis not present

## 2021-05-18 DIAGNOSIS — N2581 Secondary hyperparathyroidism of renal origin: Secondary | ICD-10-CM | POA: Diagnosis not present

## 2021-05-18 DIAGNOSIS — Z992 Dependence on renal dialysis: Secondary | ICD-10-CM | POA: Diagnosis not present

## 2021-05-18 DIAGNOSIS — D631 Anemia in chronic kidney disease: Secondary | ICD-10-CM | POA: Diagnosis not present

## 2021-05-18 DIAGNOSIS — N186 End stage renal disease: Secondary | ICD-10-CM | POA: Diagnosis not present

## 2021-05-19 NOTE — Telephone Encounter (Signed)
Spoke with Marcene Brawn from [employer]. She informed me that patient had paperwork completed for Diabetic Shoes and Marcene Brawn informed us that the paperwork says the patient had 2 diagnoses checked off on his paperwork. Marcene Brawn says it said deformity of the foot and poor circulation. Marcene Brawn states they received a note from patient recent in office visit and was there was missing information as an detailed note to explain the patient's deformity of his foot and what is causing the patient poor circulation. Marcene Brawn state they will take a addendum to patient's note stating and explaining the deformity of the foot and what is causing the poor circulation and then would like to have the updated note to be faxed over to [place] to help with insurance to help cover the Diabetic Shoes.

## 2021-05-19 NOTE — Telephone Encounter (Signed)
Spoke with Marcene Brawn from [employer] per Dr.Vigg's request to have the forms for patient's Diabetic Shoes to be faxed back to our office. Per Marcene Brawn, states that Dr.Vigg can change the choices on the form, but she states that insurance will not cover it without clarification of medical necessary diagnoses for the shoes. Marcene Brawn, states Dr.Vigg can create an addendum from most recent note stating that patient has a history of diabetic ulcer of left great toe and explaining poor circulation. Please advise?

## 2021-05-20 DIAGNOSIS — Z992 Dependence on renal dialysis: Secondary | ICD-10-CM | POA: Diagnosis not present

## 2021-05-20 DIAGNOSIS — D631 Anemia in chronic kidney disease: Secondary | ICD-10-CM | POA: Diagnosis not present

## 2021-05-20 DIAGNOSIS — N2581 Secondary hyperparathyroidism of renal origin: Secondary | ICD-10-CM | POA: Diagnosis not present

## 2021-05-20 DIAGNOSIS — D509 Iron deficiency anemia, unspecified: Secondary | ICD-10-CM | POA: Diagnosis not present

## 2021-05-20 DIAGNOSIS — N186 End stage renal disease: Secondary | ICD-10-CM | POA: Diagnosis not present

## 2021-05-23 DIAGNOSIS — D509 Iron deficiency anemia, unspecified: Secondary | ICD-10-CM | POA: Diagnosis not present

## 2021-05-23 DIAGNOSIS — Z992 Dependence on renal dialysis: Secondary | ICD-10-CM | POA: Diagnosis not present

## 2021-05-23 DIAGNOSIS — N186 End stage renal disease: Secondary | ICD-10-CM | POA: Diagnosis not present

## 2021-05-23 DIAGNOSIS — D631 Anemia in chronic kidney disease: Secondary | ICD-10-CM | POA: Diagnosis not present

## 2021-05-23 DIAGNOSIS — N2581 Secondary hyperparathyroidism of renal origin: Secondary | ICD-10-CM | POA: Diagnosis not present

## 2021-05-25 DIAGNOSIS — D631 Anemia in chronic kidney disease: Secondary | ICD-10-CM | POA: Diagnosis not present

## 2021-05-25 DIAGNOSIS — N2581 Secondary hyperparathyroidism of renal origin: Secondary | ICD-10-CM | POA: Diagnosis not present

## 2021-05-25 DIAGNOSIS — N186 End stage renal disease: Secondary | ICD-10-CM | POA: Diagnosis not present

## 2021-05-25 DIAGNOSIS — D509 Iron deficiency anemia, unspecified: Secondary | ICD-10-CM | POA: Diagnosis not present

## 2021-05-25 DIAGNOSIS — Z992 Dependence on renal dialysis: Secondary | ICD-10-CM | POA: Diagnosis not present

## 2021-05-27 DIAGNOSIS — N186 End stage renal disease: Secondary | ICD-10-CM | POA: Diagnosis not present

## 2021-05-27 DIAGNOSIS — D509 Iron deficiency anemia, unspecified: Secondary | ICD-10-CM | POA: Diagnosis not present

## 2021-05-27 DIAGNOSIS — Z992 Dependence on renal dialysis: Secondary | ICD-10-CM | POA: Diagnosis not present

## 2021-05-27 DIAGNOSIS — N2581 Secondary hyperparathyroidism of renal origin: Secondary | ICD-10-CM | POA: Diagnosis not present

## 2021-05-27 DIAGNOSIS — D631 Anemia in chronic kidney disease: Secondary | ICD-10-CM | POA: Diagnosis not present

## 2021-05-29 DIAGNOSIS — N186 End stage renal disease: Secondary | ICD-10-CM | POA: Diagnosis not present

## 2021-05-29 DIAGNOSIS — N2581 Secondary hyperparathyroidism of renal origin: Secondary | ICD-10-CM | POA: Diagnosis not present

## 2021-05-29 DIAGNOSIS — D631 Anemia in chronic kidney disease: Secondary | ICD-10-CM | POA: Diagnosis not present

## 2021-05-29 DIAGNOSIS — Z992 Dependence on renal dialysis: Secondary | ICD-10-CM | POA: Diagnosis not present

## 2021-05-29 DIAGNOSIS — D509 Iron deficiency anemia, unspecified: Secondary | ICD-10-CM | POA: Diagnosis not present

## 2021-05-31 DIAGNOSIS — D509 Iron deficiency anemia, unspecified: Secondary | ICD-10-CM | POA: Diagnosis not present

## 2021-05-31 DIAGNOSIS — N2581 Secondary hyperparathyroidism of renal origin: Secondary | ICD-10-CM | POA: Diagnosis not present

## 2021-05-31 DIAGNOSIS — N186 End stage renal disease: Secondary | ICD-10-CM | POA: Diagnosis not present

## 2021-05-31 DIAGNOSIS — L851 Acquired keratosis [keratoderma] palmaris et plantaris: Secondary | ICD-10-CM | POA: Diagnosis not present

## 2021-05-31 DIAGNOSIS — Z992 Dependence on renal dialysis: Secondary | ICD-10-CM | POA: Diagnosis not present

## 2021-05-31 DIAGNOSIS — E1140 Type 2 diabetes mellitus with diabetic neuropathy, unspecified: Secondary | ICD-10-CM | POA: Diagnosis not present

## 2021-05-31 DIAGNOSIS — Z794 Long term (current) use of insulin: Secondary | ICD-10-CM | POA: Diagnosis not present

## 2021-05-31 DIAGNOSIS — B351 Tinea unguium: Secondary | ICD-10-CM | POA: Diagnosis not present

## 2021-05-31 DIAGNOSIS — D631 Anemia in chronic kidney disease: Secondary | ICD-10-CM | POA: Diagnosis not present

## 2021-06-03 DIAGNOSIS — D631 Anemia in chronic kidney disease: Secondary | ICD-10-CM | POA: Diagnosis not present

## 2021-06-03 DIAGNOSIS — N186 End stage renal disease: Secondary | ICD-10-CM | POA: Diagnosis not present

## 2021-06-03 DIAGNOSIS — Z992 Dependence on renal dialysis: Secondary | ICD-10-CM | POA: Diagnosis not present

## 2021-06-03 DIAGNOSIS — N2581 Secondary hyperparathyroidism of renal origin: Secondary | ICD-10-CM | POA: Diagnosis not present

## 2021-06-03 DIAGNOSIS — D509 Iron deficiency anemia, unspecified: Secondary | ICD-10-CM | POA: Diagnosis not present

## 2021-06-06 DIAGNOSIS — D509 Iron deficiency anemia, unspecified: Secondary | ICD-10-CM | POA: Diagnosis not present

## 2021-06-06 DIAGNOSIS — N2581 Secondary hyperparathyroidism of renal origin: Secondary | ICD-10-CM | POA: Diagnosis not present

## 2021-06-06 DIAGNOSIS — N186 End stage renal disease: Secondary | ICD-10-CM | POA: Diagnosis not present

## 2021-06-06 DIAGNOSIS — D631 Anemia in chronic kidney disease: Secondary | ICD-10-CM | POA: Diagnosis not present

## 2021-06-06 DIAGNOSIS — Z992 Dependence on renal dialysis: Secondary | ICD-10-CM | POA: Diagnosis not present

## 2021-06-08 DIAGNOSIS — N2581 Secondary hyperparathyroidism of renal origin: Secondary | ICD-10-CM | POA: Diagnosis not present

## 2021-06-08 DIAGNOSIS — Z992 Dependence on renal dialysis: Secondary | ICD-10-CM | POA: Diagnosis not present

## 2021-06-08 DIAGNOSIS — D509 Iron deficiency anemia, unspecified: Secondary | ICD-10-CM | POA: Diagnosis not present

## 2021-06-08 DIAGNOSIS — N186 End stage renal disease: Secondary | ICD-10-CM | POA: Diagnosis not present

## 2021-06-08 DIAGNOSIS — D631 Anemia in chronic kidney disease: Secondary | ICD-10-CM | POA: Diagnosis not present

## 2021-06-10 DIAGNOSIS — D509 Iron deficiency anemia, unspecified: Secondary | ICD-10-CM | POA: Diagnosis not present

## 2021-06-10 DIAGNOSIS — Z992 Dependence on renal dialysis: Secondary | ICD-10-CM | POA: Diagnosis not present

## 2021-06-10 DIAGNOSIS — N186 End stage renal disease: Secondary | ICD-10-CM | POA: Diagnosis not present

## 2021-06-10 DIAGNOSIS — N2581 Secondary hyperparathyroidism of renal origin: Secondary | ICD-10-CM | POA: Diagnosis not present

## 2021-06-10 DIAGNOSIS — D631 Anemia in chronic kidney disease: Secondary | ICD-10-CM | POA: Diagnosis not present

## 2021-06-13 DIAGNOSIS — Z992 Dependence on renal dialysis: Secondary | ICD-10-CM | POA: Diagnosis not present

## 2021-06-13 DIAGNOSIS — N2581 Secondary hyperparathyroidism of renal origin: Secondary | ICD-10-CM | POA: Diagnosis not present

## 2021-06-13 DIAGNOSIS — N186 End stage renal disease: Secondary | ICD-10-CM | POA: Diagnosis not present

## 2021-06-13 DIAGNOSIS — D509 Iron deficiency anemia, unspecified: Secondary | ICD-10-CM | POA: Diagnosis not present

## 2021-06-13 DIAGNOSIS — D631 Anemia in chronic kidney disease: Secondary | ICD-10-CM | POA: Diagnosis not present

## 2021-06-15 DIAGNOSIS — N2581 Secondary hyperparathyroidism of renal origin: Secondary | ICD-10-CM | POA: Diagnosis not present

## 2021-06-15 DIAGNOSIS — D631 Anemia in chronic kidney disease: Secondary | ICD-10-CM | POA: Diagnosis not present

## 2021-06-15 DIAGNOSIS — D509 Iron deficiency anemia, unspecified: Secondary | ICD-10-CM | POA: Diagnosis not present

## 2021-06-15 DIAGNOSIS — N186 End stage renal disease: Secondary | ICD-10-CM | POA: Diagnosis not present

## 2021-06-15 DIAGNOSIS — Z992 Dependence on renal dialysis: Secondary | ICD-10-CM | POA: Diagnosis not present

## 2021-06-17 DIAGNOSIS — N186 End stage renal disease: Secondary | ICD-10-CM | POA: Diagnosis not present

## 2021-06-17 DIAGNOSIS — D631 Anemia in chronic kidney disease: Secondary | ICD-10-CM | POA: Diagnosis not present

## 2021-06-17 DIAGNOSIS — D509 Iron deficiency anemia, unspecified: Secondary | ICD-10-CM | POA: Diagnosis not present

## 2021-06-17 DIAGNOSIS — N2581 Secondary hyperparathyroidism of renal origin: Secondary | ICD-10-CM | POA: Diagnosis not present

## 2021-06-17 DIAGNOSIS — Z992 Dependence on renal dialysis: Secondary | ICD-10-CM | POA: Diagnosis not present

## 2021-06-20 DIAGNOSIS — N2581 Secondary hyperparathyroidism of renal origin: Secondary | ICD-10-CM | POA: Diagnosis not present

## 2021-06-20 DIAGNOSIS — D509 Iron deficiency anemia, unspecified: Secondary | ICD-10-CM | POA: Diagnosis not present

## 2021-06-20 DIAGNOSIS — D631 Anemia in chronic kidney disease: Secondary | ICD-10-CM | POA: Diagnosis not present

## 2021-06-20 DIAGNOSIS — N186 End stage renal disease: Secondary | ICD-10-CM | POA: Diagnosis not present

## 2021-06-20 DIAGNOSIS — Z992 Dependence on renal dialysis: Secondary | ICD-10-CM | POA: Diagnosis not present

## 2021-06-22 DIAGNOSIS — D631 Anemia in chronic kidney disease: Secondary | ICD-10-CM | POA: Diagnosis not present

## 2021-06-22 DIAGNOSIS — Z992 Dependence on renal dialysis: Secondary | ICD-10-CM | POA: Diagnosis not present

## 2021-06-22 DIAGNOSIS — D509 Iron deficiency anemia, unspecified: Secondary | ICD-10-CM | POA: Diagnosis not present

## 2021-06-22 DIAGNOSIS — N186 End stage renal disease: Secondary | ICD-10-CM | POA: Diagnosis not present

## 2021-06-22 DIAGNOSIS — N2581 Secondary hyperparathyroidism of renal origin: Secondary | ICD-10-CM | POA: Diagnosis not present

## 2021-06-24 DIAGNOSIS — N186 End stage renal disease: Secondary | ICD-10-CM | POA: Diagnosis not present

## 2021-06-24 DIAGNOSIS — D631 Anemia in chronic kidney disease: Secondary | ICD-10-CM | POA: Diagnosis not present

## 2021-06-24 DIAGNOSIS — N2581 Secondary hyperparathyroidism of renal origin: Secondary | ICD-10-CM | POA: Diagnosis not present

## 2021-06-24 DIAGNOSIS — Z992 Dependence on renal dialysis: Secondary | ICD-10-CM | POA: Diagnosis not present

## 2021-06-24 DIAGNOSIS — D509 Iron deficiency anemia, unspecified: Secondary | ICD-10-CM | POA: Diagnosis not present

## 2021-06-27 DIAGNOSIS — D509 Iron deficiency anemia, unspecified: Secondary | ICD-10-CM | POA: Diagnosis not present

## 2021-06-27 DIAGNOSIS — Z992 Dependence on renal dialysis: Secondary | ICD-10-CM | POA: Diagnosis not present

## 2021-06-27 DIAGNOSIS — N2581 Secondary hyperparathyroidism of renal origin: Secondary | ICD-10-CM | POA: Diagnosis not present

## 2021-06-27 DIAGNOSIS — N186 End stage renal disease: Secondary | ICD-10-CM | POA: Diagnosis not present

## 2021-06-27 DIAGNOSIS — D631 Anemia in chronic kidney disease: Secondary | ICD-10-CM | POA: Diagnosis not present

## 2021-06-29 DIAGNOSIS — N186 End stage renal disease: Secondary | ICD-10-CM | POA: Diagnosis not present

## 2021-06-29 DIAGNOSIS — N2581 Secondary hyperparathyroidism of renal origin: Secondary | ICD-10-CM | POA: Diagnosis not present

## 2021-06-29 DIAGNOSIS — Z992 Dependence on renal dialysis: Secondary | ICD-10-CM | POA: Diagnosis not present

## 2021-06-29 DIAGNOSIS — D631 Anemia in chronic kidney disease: Secondary | ICD-10-CM | POA: Diagnosis not present

## 2021-06-29 DIAGNOSIS — D509 Iron deficiency anemia, unspecified: Secondary | ICD-10-CM | POA: Diagnosis not present

## 2021-06-30 ENCOUNTER — Ambulatory Visit: Payer: Medicare Other | Admitting: Gastroenterology

## 2021-07-01 DIAGNOSIS — N186 End stage renal disease: Secondary | ICD-10-CM | POA: Diagnosis not present

## 2021-07-01 DIAGNOSIS — D509 Iron deficiency anemia, unspecified: Secondary | ICD-10-CM | POA: Diagnosis not present

## 2021-07-01 DIAGNOSIS — D631 Anemia in chronic kidney disease: Secondary | ICD-10-CM | POA: Diagnosis not present

## 2021-07-01 DIAGNOSIS — Z992 Dependence on renal dialysis: Secondary | ICD-10-CM | POA: Diagnosis not present

## 2021-07-01 DIAGNOSIS — N2581 Secondary hyperparathyroidism of renal origin: Secondary | ICD-10-CM | POA: Diagnosis not present

## 2021-07-04 DIAGNOSIS — Z992 Dependence on renal dialysis: Secondary | ICD-10-CM | POA: Diagnosis not present

## 2021-07-04 DIAGNOSIS — D631 Anemia in chronic kidney disease: Secondary | ICD-10-CM | POA: Diagnosis not present

## 2021-07-04 DIAGNOSIS — N2581 Secondary hyperparathyroidism of renal origin: Secondary | ICD-10-CM | POA: Diagnosis not present

## 2021-07-04 DIAGNOSIS — D509 Iron deficiency anemia, unspecified: Secondary | ICD-10-CM | POA: Diagnosis not present

## 2021-07-04 DIAGNOSIS — N186 End stage renal disease: Secondary | ICD-10-CM | POA: Diagnosis not present

## 2021-07-06 DIAGNOSIS — N2581 Secondary hyperparathyroidism of renal origin: Secondary | ICD-10-CM | POA: Diagnosis not present

## 2021-07-06 DIAGNOSIS — N186 End stage renal disease: Secondary | ICD-10-CM | POA: Diagnosis not present

## 2021-07-06 DIAGNOSIS — Z992 Dependence on renal dialysis: Secondary | ICD-10-CM | POA: Diagnosis not present

## 2021-07-06 DIAGNOSIS — D509 Iron deficiency anemia, unspecified: Secondary | ICD-10-CM | POA: Diagnosis not present

## 2021-07-06 DIAGNOSIS — D631 Anemia in chronic kidney disease: Secondary | ICD-10-CM | POA: Diagnosis not present

## 2021-07-08 DIAGNOSIS — D509 Iron deficiency anemia, unspecified: Secondary | ICD-10-CM | POA: Diagnosis not present

## 2021-07-08 DIAGNOSIS — N2581 Secondary hyperparathyroidism of renal origin: Secondary | ICD-10-CM | POA: Diagnosis not present

## 2021-07-08 DIAGNOSIS — N186 End stage renal disease: Secondary | ICD-10-CM | POA: Diagnosis not present

## 2021-07-08 DIAGNOSIS — Z992 Dependence on renal dialysis: Secondary | ICD-10-CM | POA: Diagnosis not present

## 2021-07-08 DIAGNOSIS — D631 Anemia in chronic kidney disease: Secondary | ICD-10-CM | POA: Diagnosis not present

## 2021-07-09 DIAGNOSIS — Z992 Dependence on renal dialysis: Secondary | ICD-10-CM | POA: Diagnosis not present

## 2021-07-09 DIAGNOSIS — N186 End stage renal disease: Secondary | ICD-10-CM | POA: Diagnosis not present

## 2021-07-11 DIAGNOSIS — D509 Iron deficiency anemia, unspecified: Secondary | ICD-10-CM | POA: Diagnosis not present

## 2021-07-11 DIAGNOSIS — N2581 Secondary hyperparathyroidism of renal origin: Secondary | ICD-10-CM | POA: Diagnosis not present

## 2021-07-11 DIAGNOSIS — D631 Anemia in chronic kidney disease: Secondary | ICD-10-CM | POA: Diagnosis not present

## 2021-07-11 DIAGNOSIS — Z992 Dependence on renal dialysis: Secondary | ICD-10-CM | POA: Diagnosis not present

## 2021-07-11 DIAGNOSIS — N186 End stage renal disease: Secondary | ICD-10-CM | POA: Diagnosis not present

## 2021-07-13 DIAGNOSIS — N186 End stage renal disease: Secondary | ICD-10-CM | POA: Diagnosis not present

## 2021-07-13 DIAGNOSIS — D509 Iron deficiency anemia, unspecified: Secondary | ICD-10-CM | POA: Diagnosis not present

## 2021-07-13 DIAGNOSIS — D631 Anemia in chronic kidney disease: Secondary | ICD-10-CM | POA: Diagnosis not present

## 2021-07-13 DIAGNOSIS — N2581 Secondary hyperparathyroidism of renal origin: Secondary | ICD-10-CM | POA: Diagnosis not present

## 2021-07-13 DIAGNOSIS — Z992 Dependence on renal dialysis: Secondary | ICD-10-CM | POA: Diagnosis not present

## 2021-07-15 DIAGNOSIS — N186 End stage renal disease: Secondary | ICD-10-CM | POA: Diagnosis not present

## 2021-07-15 DIAGNOSIS — Z992 Dependence on renal dialysis: Secondary | ICD-10-CM | POA: Diagnosis not present

## 2021-07-15 DIAGNOSIS — N2581 Secondary hyperparathyroidism of renal origin: Secondary | ICD-10-CM | POA: Diagnosis not present

## 2021-07-15 DIAGNOSIS — D631 Anemia in chronic kidney disease: Secondary | ICD-10-CM | POA: Diagnosis not present

## 2021-07-15 DIAGNOSIS — D509 Iron deficiency anemia, unspecified: Secondary | ICD-10-CM | POA: Diagnosis not present

## 2021-07-18 DIAGNOSIS — N2581 Secondary hyperparathyroidism of renal origin: Secondary | ICD-10-CM | POA: Diagnosis not present

## 2021-07-18 DIAGNOSIS — Z992 Dependence on renal dialysis: Secondary | ICD-10-CM | POA: Diagnosis not present

## 2021-07-18 DIAGNOSIS — D631 Anemia in chronic kidney disease: Secondary | ICD-10-CM | POA: Diagnosis not present

## 2021-07-18 DIAGNOSIS — N186 End stage renal disease: Secondary | ICD-10-CM | POA: Diagnosis not present

## 2021-07-18 DIAGNOSIS — D509 Iron deficiency anemia, unspecified: Secondary | ICD-10-CM | POA: Diagnosis not present

## 2021-07-18 DIAGNOSIS — E119 Type 2 diabetes mellitus without complications: Secondary | ICD-10-CM | POA: Diagnosis not present

## 2021-07-20 DIAGNOSIS — D631 Anemia in chronic kidney disease: Secondary | ICD-10-CM | POA: Diagnosis not present

## 2021-07-20 DIAGNOSIS — D509 Iron deficiency anemia, unspecified: Secondary | ICD-10-CM | POA: Diagnosis not present

## 2021-07-20 DIAGNOSIS — Z992 Dependence on renal dialysis: Secondary | ICD-10-CM | POA: Diagnosis not present

## 2021-07-20 DIAGNOSIS — N186 End stage renal disease: Secondary | ICD-10-CM | POA: Diagnosis not present

## 2021-07-20 DIAGNOSIS — N2581 Secondary hyperparathyroidism of renal origin: Secondary | ICD-10-CM | POA: Diagnosis not present

## 2021-07-22 DIAGNOSIS — D631 Anemia in chronic kidney disease: Secondary | ICD-10-CM | POA: Diagnosis not present

## 2021-07-22 DIAGNOSIS — D509 Iron deficiency anemia, unspecified: Secondary | ICD-10-CM | POA: Diagnosis not present

## 2021-07-22 DIAGNOSIS — N2581 Secondary hyperparathyroidism of renal origin: Secondary | ICD-10-CM | POA: Diagnosis not present

## 2021-07-22 DIAGNOSIS — N186 End stage renal disease: Secondary | ICD-10-CM | POA: Diagnosis not present

## 2021-07-22 DIAGNOSIS — Z992 Dependence on renal dialysis: Secondary | ICD-10-CM | POA: Diagnosis not present

## 2021-07-25 DIAGNOSIS — D631 Anemia in chronic kidney disease: Secondary | ICD-10-CM | POA: Diagnosis not present

## 2021-07-25 DIAGNOSIS — N186 End stage renal disease: Secondary | ICD-10-CM | POA: Diagnosis not present

## 2021-07-25 DIAGNOSIS — Z992 Dependence on renal dialysis: Secondary | ICD-10-CM | POA: Diagnosis not present

## 2021-07-25 DIAGNOSIS — D509 Iron deficiency anemia, unspecified: Secondary | ICD-10-CM | POA: Diagnosis not present

## 2021-07-25 DIAGNOSIS — N2581 Secondary hyperparathyroidism of renal origin: Secondary | ICD-10-CM | POA: Diagnosis not present

## 2021-07-27 DIAGNOSIS — Z992 Dependence on renal dialysis: Secondary | ICD-10-CM | POA: Diagnosis not present

## 2021-07-27 DIAGNOSIS — D509 Iron deficiency anemia, unspecified: Secondary | ICD-10-CM | POA: Diagnosis not present

## 2021-07-27 DIAGNOSIS — Z20822 Contact with and (suspected) exposure to covid-19: Secondary | ICD-10-CM | POA: Diagnosis not present

## 2021-07-27 DIAGNOSIS — D631 Anemia in chronic kidney disease: Secondary | ICD-10-CM | POA: Diagnosis not present

## 2021-07-27 DIAGNOSIS — N186 End stage renal disease: Secondary | ICD-10-CM | POA: Diagnosis not present

## 2021-07-27 DIAGNOSIS — N2581 Secondary hyperparathyroidism of renal origin: Secondary | ICD-10-CM | POA: Diagnosis not present

## 2021-07-29 ENCOUNTER — Other Ambulatory Visit: Payer: Self-pay | Admitting: Nurse Practitioner

## 2021-07-29 DIAGNOSIS — N2581 Secondary hyperparathyroidism of renal origin: Secondary | ICD-10-CM | POA: Diagnosis not present

## 2021-07-29 DIAGNOSIS — D631 Anemia in chronic kidney disease: Secondary | ICD-10-CM | POA: Diagnosis not present

## 2021-07-29 DIAGNOSIS — Z992 Dependence on renal dialysis: Secondary | ICD-10-CM | POA: Diagnosis not present

## 2021-07-29 DIAGNOSIS — N186 End stage renal disease: Secondary | ICD-10-CM | POA: Diagnosis not present

## 2021-07-29 DIAGNOSIS — D509 Iron deficiency anemia, unspecified: Secondary | ICD-10-CM | POA: Diagnosis not present

## 2021-07-29 NOTE — Telephone Encounter (Signed)
Requested Prescriptions  Pending Prescriptions Disp Refills   losartan (COZAAR) 100 MG tablet [Pharmacy Med Name: LOSARTAN 100MG  TABLETS] 90 tablet 0    Sig: TAKE 1 TABLET(100 MG) BY MOUTH DAILY     Cardiovascular:  Angiotensin Receptor Blockers Failed - 07/29/2021  3:10 AM      Failed - Cr in normal range and within 180 days    Creatinine  Date Value Ref Range Status  08/13/2011 8.72 (H) 0.60 - 1.30 mg/dL Final   Creatinine, Ser  Date Value Ref Range Status  04/28/2021 7.59 (H) 0.76 - 1.27 mg/dL Final         Failed - Last BP in normal range    BP Readings from Last 1 Encounters:  05/12/21 (!) 205/82         Passed - K in normal range and within 180 days    Potassium  Date Value Ref Range Status  04/28/2021 3.8 3.5 - 5.2 mmol/L Final  08/13/2011 4.3 3.5 - 5.1 mmol/L Final   Potassium [facility] vascular lab)  Date Value Ref Range Status  09/01/2020 4.1 3.5 - 5.1 Final    Comment:    Performed at [facility], [address] [place], [place] [address]         [place] - Patient is not pregnant      Passed - Valid encounter within last 6 months    Recent Outpatient Visits          2 months ago Hypertension associated with diabetes ([place])   [facility] Jon Billings, NP   3 months ago Hypertension associated with diabetes ([place])   [place] Vigg, Avanti, MD   9 months ago Cough   [place], Lauren A, NP   1 year ago Type 2 diabetes mellitus with hyperglycemia, without long-term current use of insulin ([place])   [place], [place] P, DO   1 year ago Pneumonia of left lower lobe due to infectious organism   [facility] Valerie Roys, DO      Future Appointments            In 3 months Jon Billings, NP [facility], [facility]            Not on current med list.

## 2021-08-01 DIAGNOSIS — N2581 Secondary hyperparathyroidism of renal origin: Secondary | ICD-10-CM | POA: Diagnosis not present

## 2021-08-01 DIAGNOSIS — Z992 Dependence on renal dialysis: Secondary | ICD-10-CM | POA: Diagnosis not present

## 2021-08-01 DIAGNOSIS — N186 End stage renal disease: Secondary | ICD-10-CM | POA: Diagnosis not present

## 2021-08-01 DIAGNOSIS — D631 Anemia in chronic kidney disease: Secondary | ICD-10-CM | POA: Diagnosis not present

## 2021-08-01 DIAGNOSIS — D509 Iron deficiency anemia, unspecified: Secondary | ICD-10-CM | POA: Diagnosis not present

## 2021-08-03 DIAGNOSIS — Z992 Dependence on renal dialysis: Secondary | ICD-10-CM | POA: Diagnosis not present

## 2021-08-03 DIAGNOSIS — N2581 Secondary hyperparathyroidism of renal origin: Secondary | ICD-10-CM | POA: Diagnosis not present

## 2021-08-03 DIAGNOSIS — N186 End stage renal disease: Secondary | ICD-10-CM | POA: Diagnosis not present

## 2021-08-03 DIAGNOSIS — D631 Anemia in chronic kidney disease: Secondary | ICD-10-CM | POA: Diagnosis not present

## 2021-08-03 DIAGNOSIS — D509 Iron deficiency anemia, unspecified: Secondary | ICD-10-CM | POA: Diagnosis not present

## 2021-08-05 DIAGNOSIS — D631 Anemia in chronic kidney disease: Secondary | ICD-10-CM | POA: Diagnosis not present

## 2021-08-05 DIAGNOSIS — D509 Iron deficiency anemia, unspecified: Secondary | ICD-10-CM | POA: Diagnosis not present

## 2021-08-05 DIAGNOSIS — N186 End stage renal disease: Secondary | ICD-10-CM | POA: Diagnosis not present

## 2021-08-05 DIAGNOSIS — N2581 Secondary hyperparathyroidism of renal origin: Secondary | ICD-10-CM | POA: Diagnosis not present

## 2021-08-05 DIAGNOSIS — Z992 Dependence on renal dialysis: Secondary | ICD-10-CM | POA: Diagnosis not present

## 2021-08-08 DIAGNOSIS — N186 End stage renal disease: Secondary | ICD-10-CM | POA: Diagnosis not present

## 2021-08-08 DIAGNOSIS — D631 Anemia in chronic kidney disease: Secondary | ICD-10-CM | POA: Diagnosis not present

## 2021-08-08 DIAGNOSIS — N2581 Secondary hyperparathyroidism of renal origin: Secondary | ICD-10-CM | POA: Diagnosis not present

## 2021-08-08 DIAGNOSIS — Z992 Dependence on renal dialysis: Secondary | ICD-10-CM | POA: Diagnosis not present

## 2021-08-08 DIAGNOSIS — D509 Iron deficiency anemia, unspecified: Secondary | ICD-10-CM | POA: Diagnosis not present

## 2021-08-09 DIAGNOSIS — N186 End stage renal disease: Secondary | ICD-10-CM | POA: Diagnosis not present

## 2021-08-09 DIAGNOSIS — Z992 Dependence on renal dialysis: Secondary | ICD-10-CM | POA: Diagnosis not present

## 2021-08-10 DIAGNOSIS — N186 End stage renal disease: Secondary | ICD-10-CM | POA: Diagnosis not present

## 2021-08-10 DIAGNOSIS — N2581 Secondary hyperparathyroidism of renal origin: Secondary | ICD-10-CM | POA: Diagnosis not present

## 2021-08-10 DIAGNOSIS — D631 Anemia in chronic kidney disease: Secondary | ICD-10-CM | POA: Diagnosis not present

## 2021-08-10 DIAGNOSIS — Z992 Dependence on renal dialysis: Secondary | ICD-10-CM | POA: Diagnosis not present

## 2021-08-10 DIAGNOSIS — D509 Iron deficiency anemia, unspecified: Secondary | ICD-10-CM | POA: Diagnosis not present

## 2021-08-12 ENCOUNTER — Telehealth: Payer: Self-pay | Admitting: Nurse Practitioner

## 2021-08-12 DIAGNOSIS — Z992 Dependence on renal dialysis: Secondary | ICD-10-CM | POA: Diagnosis not present

## 2021-08-12 DIAGNOSIS — D509 Iron deficiency anemia, unspecified: Secondary | ICD-10-CM | POA: Diagnosis not present

## 2021-08-12 DIAGNOSIS — D631 Anemia in chronic kidney disease: Secondary | ICD-10-CM | POA: Diagnosis not present

## 2021-08-12 DIAGNOSIS — N2581 Secondary hyperparathyroidism of renal origin: Secondary | ICD-10-CM | POA: Diagnosis not present

## 2021-08-12 DIAGNOSIS — N186 End stage renal disease: Secondary | ICD-10-CM | POA: Diagnosis not present

## 2021-08-12 NOTE — Telephone Encounter (Signed)
Copied from [place] [phone]. Topic: Medicare AWV >> Aug 12, 2021  3:33 PM Anand, [place] wrote: Reason for CRM:  N/A unable to leave a message for patient to call back and schedule the Medicare Annual Wellness Visit (AWV) virtually or by telephone.  Last AWV 09/17/19  Please schedule at anytime with [facility]-Nurse Health Advisor.  45 minute appointment  Any questions, please call me at [phone]

## 2021-08-15 DIAGNOSIS — Z992 Dependence on renal dialysis: Secondary | ICD-10-CM | POA: Diagnosis not present

## 2021-08-15 DIAGNOSIS — D631 Anemia in chronic kidney disease: Secondary | ICD-10-CM | POA: Diagnosis not present

## 2021-08-15 DIAGNOSIS — N2581 Secondary hyperparathyroidism of renal origin: Secondary | ICD-10-CM | POA: Diagnosis not present

## 2021-08-15 DIAGNOSIS — D509 Iron deficiency anemia, unspecified: Secondary | ICD-10-CM | POA: Diagnosis not present

## 2021-08-15 DIAGNOSIS — N186 End stage renal disease: Secondary | ICD-10-CM | POA: Diagnosis not present

## 2021-08-17 ENCOUNTER — Other Ambulatory Visit: Payer: Self-pay | Admitting: Family Medicine

## 2021-08-17 DIAGNOSIS — D631 Anemia in chronic kidney disease: Secondary | ICD-10-CM | POA: Diagnosis not present

## 2021-08-17 DIAGNOSIS — D509 Iron deficiency anemia, unspecified: Secondary | ICD-10-CM | POA: Diagnosis not present

## 2021-08-17 DIAGNOSIS — N186 End stage renal disease: Secondary | ICD-10-CM | POA: Diagnosis not present

## 2021-08-17 DIAGNOSIS — Z992 Dependence on renal dialysis: Secondary | ICD-10-CM | POA: Diagnosis not present

## 2021-08-17 DIAGNOSIS — N2581 Secondary hyperparathyroidism of renal origin: Secondary | ICD-10-CM | POA: Diagnosis not present

## 2021-08-17 NOTE — Telephone Encounter (Signed)
Requested Prescriptions  Pending Prescriptions Disp Refills   insulin degludec (TRESIBA FLEXTOUCH) 100 UNIT/ML FlexTouch Pen [Pharmacy Med Name: TRESIBA FLEXTOUCH PEN ([id number]] 6 mL 1    Sig: ADMINISTER 10 UNITS UNDER THE SKIN AT BEDTIME     Endocrinology:  Diabetes - Insulins Passed - 08/17/2021 12:54 PM      Passed - HBA1C is between 0 and 7.9 and within 180 days    Hemoglobin A1C  Date Value Ref Range Status  10/20/2019 6.0  Final   HB A1C (BAYER DCA - WAIVED)  Date Value Ref Range Status  04/28/2021 5.1 4.8 - 5.6 % Final    Comment:             Prediabetes: 5.7 - 6.4          Diabetes: >6.4          Glycemic control for adults with diabetes: <7.0               **Please note reference interval change**          Passed - Valid encounter within last 6 months    Recent Outpatient Visits          3 months ago Hypertension associated with diabetes ([place])   [facility] Jon Billings, NP   3 months ago Hypertension associated with diabetes ([place])   [place] Vigg, Avanti, MD   9 months ago Cough   [place], Lauren A, NP   1 year ago Type 2 diabetes mellitus with hyperglycemia, without long-term current use of insulin ([place])   [place], Megan P, DO   1 year ago Pneumonia of left lower lobe due to infectious organism   [facility] Valerie Roys, DO      Future Appointments            In 2 months Jon Billings, NP [facility], [place]

## 2021-08-19 ENCOUNTER — Other Ambulatory Visit: Payer: Self-pay | Admitting: Nurse Practitioner

## 2021-08-19 DIAGNOSIS — D509 Iron deficiency anemia, unspecified: Secondary | ICD-10-CM | POA: Diagnosis not present

## 2021-08-19 DIAGNOSIS — Z992 Dependence on renal dialysis: Secondary | ICD-10-CM | POA: Diagnosis not present

## 2021-08-19 DIAGNOSIS — D631 Anemia in chronic kidney disease: Secondary | ICD-10-CM | POA: Diagnosis not present

## 2021-08-19 DIAGNOSIS — N2581 Secondary hyperparathyroidism of renal origin: Secondary | ICD-10-CM | POA: Diagnosis not present

## 2021-08-19 DIAGNOSIS — N186 End stage renal disease: Secondary | ICD-10-CM | POA: Diagnosis not present

## 2021-08-19 NOTE — Telephone Encounter (Signed)
Requested medications are due for refill today.  yes  Requested medications are on the active medications list.  yes  Last refill. 10/09/2020 180/1 refill  Future visit scheduled.   yes  Notes to clinic.  No protocol for this medication    Requested Prescriptions  Pending Prescriptions Disp Refills   ANORO ELLIPTA 62.5-25 MCG/ACT AEPB [Pharmacy Med Name: ANORO ELLIPTA 62.5-25 ORAL INH(30S)] 180 each 1    Sig: INHALE 1 PUFF INTO THE LUNGS DAILY     There is no refill protocol information for this order

## 2021-08-22 DIAGNOSIS — Z992 Dependence on renal dialysis: Secondary | ICD-10-CM | POA: Diagnosis not present

## 2021-08-22 DIAGNOSIS — N186 End stage renal disease: Secondary | ICD-10-CM | POA: Diagnosis not present

## 2021-08-22 DIAGNOSIS — N2581 Secondary hyperparathyroidism of renal origin: Secondary | ICD-10-CM | POA: Diagnosis not present

## 2021-08-22 DIAGNOSIS — D631 Anemia in chronic kidney disease: Secondary | ICD-10-CM | POA: Diagnosis not present

## 2021-08-22 DIAGNOSIS — D509 Iron deficiency anemia, unspecified: Secondary | ICD-10-CM | POA: Diagnosis not present

## 2021-08-24 DIAGNOSIS — D509 Iron deficiency anemia, unspecified: Secondary | ICD-10-CM | POA: Diagnosis not present

## 2021-08-24 DIAGNOSIS — N2581 Secondary hyperparathyroidism of renal origin: Secondary | ICD-10-CM | POA: Diagnosis not present

## 2021-08-24 DIAGNOSIS — D631 Anemia in chronic kidney disease: Secondary | ICD-10-CM | POA: Diagnosis not present

## 2021-08-24 DIAGNOSIS — N186 End stage renal disease: Secondary | ICD-10-CM | POA: Diagnosis not present

## 2021-08-24 DIAGNOSIS — Z992 Dependence on renal dialysis: Secondary | ICD-10-CM | POA: Diagnosis not present

## 2021-08-26 DIAGNOSIS — N2581 Secondary hyperparathyroidism of renal origin: Secondary | ICD-10-CM | POA: Diagnosis not present

## 2021-08-26 DIAGNOSIS — D631 Anemia in chronic kidney disease: Secondary | ICD-10-CM | POA: Diagnosis not present

## 2021-08-26 DIAGNOSIS — D509 Iron deficiency anemia, unspecified: Secondary | ICD-10-CM | POA: Diagnosis not present

## 2021-08-26 DIAGNOSIS — Z992 Dependence on renal dialysis: Secondary | ICD-10-CM | POA: Diagnosis not present

## 2021-08-26 DIAGNOSIS — N186 End stage renal disease: Secondary | ICD-10-CM | POA: Diagnosis not present

## 2021-08-29 DIAGNOSIS — Z992 Dependence on renal dialysis: Secondary | ICD-10-CM | POA: Diagnosis not present

## 2021-08-29 DIAGNOSIS — N2581 Secondary hyperparathyroidism of renal origin: Secondary | ICD-10-CM | POA: Diagnosis not present

## 2021-08-29 DIAGNOSIS — D631 Anemia in chronic kidney disease: Secondary | ICD-10-CM | POA: Diagnosis not present

## 2021-08-29 DIAGNOSIS — N186 End stage renal disease: Secondary | ICD-10-CM | POA: Diagnosis not present

## 2021-08-29 DIAGNOSIS — D509 Iron deficiency anemia, unspecified: Secondary | ICD-10-CM | POA: Diagnosis not present

## 2021-08-31 DIAGNOSIS — D509 Iron deficiency anemia, unspecified: Secondary | ICD-10-CM | POA: Diagnosis not present

## 2021-08-31 DIAGNOSIS — N186 End stage renal disease: Secondary | ICD-10-CM | POA: Diagnosis not present

## 2021-08-31 DIAGNOSIS — D631 Anemia in chronic kidney disease: Secondary | ICD-10-CM | POA: Diagnosis not present

## 2021-08-31 DIAGNOSIS — N2581 Secondary hyperparathyroidism of renal origin: Secondary | ICD-10-CM | POA: Diagnosis not present

## 2021-08-31 DIAGNOSIS — Z992 Dependence on renal dialysis: Secondary | ICD-10-CM | POA: Diagnosis not present

## 2021-09-02 DIAGNOSIS — Z992 Dependence on renal dialysis: Secondary | ICD-10-CM | POA: Diagnosis not present

## 2021-09-02 DIAGNOSIS — D509 Iron deficiency anemia, unspecified: Secondary | ICD-10-CM | POA: Diagnosis not present

## 2021-09-02 DIAGNOSIS — N2581 Secondary hyperparathyroidism of renal origin: Secondary | ICD-10-CM | POA: Diagnosis not present

## 2021-09-02 DIAGNOSIS — N186 End stage renal disease: Secondary | ICD-10-CM | POA: Diagnosis not present

## 2021-09-02 DIAGNOSIS — D631 Anemia in chronic kidney disease: Secondary | ICD-10-CM | POA: Diagnosis not present

## 2021-09-05 DIAGNOSIS — N186 End stage renal disease: Secondary | ICD-10-CM | POA: Diagnosis not present

## 2021-09-05 DIAGNOSIS — Z992 Dependence on renal dialysis: Secondary | ICD-10-CM | POA: Diagnosis not present

## 2021-09-05 DIAGNOSIS — D631 Anemia in chronic kidney disease: Secondary | ICD-10-CM | POA: Diagnosis not present

## 2021-09-05 DIAGNOSIS — D509 Iron deficiency anemia, unspecified: Secondary | ICD-10-CM | POA: Diagnosis not present

## 2021-09-05 DIAGNOSIS — N2581 Secondary hyperparathyroidism of renal origin: Secondary | ICD-10-CM | POA: Diagnosis not present

## 2021-09-06 ENCOUNTER — Telehealth: Payer: Self-pay | Admitting: Nurse Practitioner

## 2021-09-06 DIAGNOSIS — N186 End stage renal disease: Secondary | ICD-10-CM | POA: Diagnosis not present

## 2021-09-06 DIAGNOSIS — Z992 Dependence on renal dialysis: Secondary | ICD-10-CM | POA: Diagnosis not present

## 2021-09-06 NOTE — Telephone Encounter (Signed)
[place] called and spoke to [place], [facility] to get clarity on what is needed to refill the [place] Flextouch Pen. He says it went through the insurance and the patient can pick up 2 pens for $10.35 a 50 day supply in about 1 hour. Patient's friend Jennefer Bravo, on DPR, called, left VM to return the call to the office. Patient called and he answered, advised of the above, he verbalized understanding.

## 2021-09-06 NOTE — Telephone Encounter (Signed)
[facility] called to clarify what they are needing, extended hold time, will try later.

## 2021-09-06 NOTE — Telephone Encounter (Signed)
Patient care taker "Olegario Shearer" just left the pharmacy and was advised to call PCP and request for patient insulin pens, pharmacy advised awaiting authorization from patient PCP.       [facility] [place], [place] AT [facility] [address] [facility] Phone:  [phone]  Fax:  [phone]

## 2021-09-07 DIAGNOSIS — N2581 Secondary hyperparathyroidism of renal origin: Secondary | ICD-10-CM | POA: Diagnosis not present

## 2021-09-07 DIAGNOSIS — D631 Anemia in chronic kidney disease: Secondary | ICD-10-CM | POA: Diagnosis not present

## 2021-09-07 DIAGNOSIS — Z992 Dependence on renal dialysis: Secondary | ICD-10-CM | POA: Diagnosis not present

## 2021-09-07 DIAGNOSIS — N186 End stage renal disease: Secondary | ICD-10-CM | POA: Diagnosis not present

## 2021-09-07 DIAGNOSIS — D509 Iron deficiency anemia, unspecified: Secondary | ICD-10-CM | POA: Diagnosis not present

## 2021-09-09 DIAGNOSIS — N186 End stage renal disease: Secondary | ICD-10-CM | POA: Diagnosis not present

## 2021-09-09 DIAGNOSIS — Z992 Dependence on renal dialysis: Secondary | ICD-10-CM | POA: Diagnosis not present

## 2021-09-09 DIAGNOSIS — N2581 Secondary hyperparathyroidism of renal origin: Secondary | ICD-10-CM | POA: Diagnosis not present

## 2021-09-09 DIAGNOSIS — D631 Anemia in chronic kidney disease: Secondary | ICD-10-CM | POA: Diagnosis not present

## 2021-09-09 DIAGNOSIS — D509 Iron deficiency anemia, unspecified: Secondary | ICD-10-CM | POA: Diagnosis not present

## 2021-09-12 DIAGNOSIS — D509 Iron deficiency anemia, unspecified: Secondary | ICD-10-CM | POA: Diagnosis not present

## 2021-09-12 DIAGNOSIS — N186 End stage renal disease: Secondary | ICD-10-CM | POA: Diagnosis not present

## 2021-09-12 DIAGNOSIS — D631 Anemia in chronic kidney disease: Secondary | ICD-10-CM | POA: Diagnosis not present

## 2021-09-12 DIAGNOSIS — Z992 Dependence on renal dialysis: Secondary | ICD-10-CM | POA: Diagnosis not present

## 2021-09-12 DIAGNOSIS — N2581 Secondary hyperparathyroidism of renal origin: Secondary | ICD-10-CM | POA: Diagnosis not present

## 2021-09-14 DIAGNOSIS — Z992 Dependence on renal dialysis: Secondary | ICD-10-CM | POA: Diagnosis not present

## 2021-09-14 DIAGNOSIS — D509 Iron deficiency anemia, unspecified: Secondary | ICD-10-CM | POA: Diagnosis not present

## 2021-09-14 DIAGNOSIS — N2581 Secondary hyperparathyroidism of renal origin: Secondary | ICD-10-CM | POA: Diagnosis not present

## 2021-09-14 DIAGNOSIS — N186 End stage renal disease: Secondary | ICD-10-CM | POA: Diagnosis not present

## 2021-09-14 DIAGNOSIS — D631 Anemia in chronic kidney disease: Secondary | ICD-10-CM | POA: Diagnosis not present

## 2021-09-16 DIAGNOSIS — Z992 Dependence on renal dialysis: Secondary | ICD-10-CM | POA: Diagnosis not present

## 2021-09-16 DIAGNOSIS — N2581 Secondary hyperparathyroidism of renal origin: Secondary | ICD-10-CM | POA: Diagnosis not present

## 2021-09-16 DIAGNOSIS — D631 Anemia in chronic kidney disease: Secondary | ICD-10-CM | POA: Diagnosis not present

## 2021-09-16 DIAGNOSIS — D509 Iron deficiency anemia, unspecified: Secondary | ICD-10-CM | POA: Diagnosis not present

## 2021-09-16 DIAGNOSIS — N186 End stage renal disease: Secondary | ICD-10-CM | POA: Diagnosis not present

## 2021-09-19 DIAGNOSIS — D509 Iron deficiency anemia, unspecified: Secondary | ICD-10-CM | POA: Diagnosis not present

## 2021-09-19 DIAGNOSIS — D631 Anemia in chronic kidney disease: Secondary | ICD-10-CM | POA: Diagnosis not present

## 2021-09-19 DIAGNOSIS — N2581 Secondary hyperparathyroidism of renal origin: Secondary | ICD-10-CM | POA: Diagnosis not present

## 2021-09-19 DIAGNOSIS — Z992 Dependence on renal dialysis: Secondary | ICD-10-CM | POA: Diagnosis not present

## 2021-09-19 DIAGNOSIS — N186 End stage renal disease: Secondary | ICD-10-CM | POA: Diagnosis not present

## 2021-09-21 DIAGNOSIS — N2581 Secondary hyperparathyroidism of renal origin: Secondary | ICD-10-CM | POA: Diagnosis not present

## 2021-09-21 DIAGNOSIS — N186 End stage renal disease: Secondary | ICD-10-CM | POA: Diagnosis not present

## 2021-09-21 DIAGNOSIS — D509 Iron deficiency anemia, unspecified: Secondary | ICD-10-CM | POA: Diagnosis not present

## 2021-09-21 DIAGNOSIS — Z992 Dependence on renal dialysis: Secondary | ICD-10-CM | POA: Diagnosis not present

## 2021-09-21 DIAGNOSIS — D631 Anemia in chronic kidney disease: Secondary | ICD-10-CM | POA: Diagnosis not present

## 2021-09-23 DIAGNOSIS — Z992 Dependence on renal dialysis: Secondary | ICD-10-CM | POA: Diagnosis not present

## 2021-09-23 DIAGNOSIS — D509 Iron deficiency anemia, unspecified: Secondary | ICD-10-CM | POA: Diagnosis not present

## 2021-09-23 DIAGNOSIS — N186 End stage renal disease: Secondary | ICD-10-CM | POA: Diagnosis not present

## 2021-09-23 DIAGNOSIS — N2581 Secondary hyperparathyroidism of renal origin: Secondary | ICD-10-CM | POA: Diagnosis not present

## 2021-09-23 DIAGNOSIS — D631 Anemia in chronic kidney disease: Secondary | ICD-10-CM | POA: Diagnosis not present

## 2021-09-26 DIAGNOSIS — N186 End stage renal disease: Secondary | ICD-10-CM | POA: Diagnosis not present

## 2021-09-26 DIAGNOSIS — Z992 Dependence on renal dialysis: Secondary | ICD-10-CM | POA: Diagnosis not present

## 2021-09-26 DIAGNOSIS — D631 Anemia in chronic kidney disease: Secondary | ICD-10-CM | POA: Diagnosis not present

## 2021-09-26 DIAGNOSIS — D509 Iron deficiency anemia, unspecified: Secondary | ICD-10-CM | POA: Diagnosis not present

## 2021-09-26 DIAGNOSIS — N2581 Secondary hyperparathyroidism of renal origin: Secondary | ICD-10-CM | POA: Diagnosis not present

## 2021-09-28 DIAGNOSIS — D509 Iron deficiency anemia, unspecified: Secondary | ICD-10-CM | POA: Diagnosis not present

## 2021-09-28 DIAGNOSIS — D631 Anemia in chronic kidney disease: Secondary | ICD-10-CM | POA: Diagnosis not present

## 2021-09-28 DIAGNOSIS — N186 End stage renal disease: Secondary | ICD-10-CM | POA: Diagnosis not present

## 2021-09-28 DIAGNOSIS — Z992 Dependence on renal dialysis: Secondary | ICD-10-CM | POA: Diagnosis not present

## 2021-09-28 DIAGNOSIS — N2581 Secondary hyperparathyroidism of renal origin: Secondary | ICD-10-CM | POA: Diagnosis not present

## 2021-09-30 DIAGNOSIS — D631 Anemia in chronic kidney disease: Secondary | ICD-10-CM | POA: Diagnosis not present

## 2021-09-30 DIAGNOSIS — Z992 Dependence on renal dialysis: Secondary | ICD-10-CM | POA: Diagnosis not present

## 2021-09-30 DIAGNOSIS — D509 Iron deficiency anemia, unspecified: Secondary | ICD-10-CM | POA: Diagnosis not present

## 2021-09-30 DIAGNOSIS — N186 End stage renal disease: Secondary | ICD-10-CM | POA: Diagnosis not present

## 2021-09-30 DIAGNOSIS — N2581 Secondary hyperparathyroidism of renal origin: Secondary | ICD-10-CM | POA: Diagnosis not present

## 2021-10-03 DIAGNOSIS — N186 End stage renal disease: Secondary | ICD-10-CM | POA: Diagnosis not present

## 2021-10-03 DIAGNOSIS — D631 Anemia in chronic kidney disease: Secondary | ICD-10-CM | POA: Diagnosis not present

## 2021-10-03 DIAGNOSIS — Z992 Dependence on renal dialysis: Secondary | ICD-10-CM | POA: Diagnosis not present

## 2021-10-03 DIAGNOSIS — N2581 Secondary hyperparathyroidism of renal origin: Secondary | ICD-10-CM | POA: Diagnosis not present

## 2021-10-03 DIAGNOSIS — D509 Iron deficiency anemia, unspecified: Secondary | ICD-10-CM | POA: Diagnosis not present

## 2021-10-05 DIAGNOSIS — N186 End stage renal disease: Secondary | ICD-10-CM | POA: Diagnosis not present

## 2021-10-05 DIAGNOSIS — N2581 Secondary hyperparathyroidism of renal origin: Secondary | ICD-10-CM | POA: Diagnosis not present

## 2021-10-05 DIAGNOSIS — D509 Iron deficiency anemia, unspecified: Secondary | ICD-10-CM | POA: Diagnosis not present

## 2021-10-05 DIAGNOSIS — Z992 Dependence on renal dialysis: Secondary | ICD-10-CM | POA: Diagnosis not present

## 2021-10-05 DIAGNOSIS — D631 Anemia in chronic kidney disease: Secondary | ICD-10-CM | POA: Diagnosis not present

## 2021-10-06 DIAGNOSIS — Z794 Long term (current) use of insulin: Secondary | ICD-10-CM | POA: Diagnosis not present

## 2021-10-06 DIAGNOSIS — B351 Tinea unguium: Secondary | ICD-10-CM | POA: Diagnosis not present

## 2021-10-06 DIAGNOSIS — E1140 Type 2 diabetes mellitus with diabetic neuropathy, unspecified: Secondary | ICD-10-CM | POA: Diagnosis not present

## 2021-10-07 DIAGNOSIS — D631 Anemia in chronic kidney disease: Secondary | ICD-10-CM | POA: Diagnosis not present

## 2021-10-07 DIAGNOSIS — Z992 Dependence on renal dialysis: Secondary | ICD-10-CM | POA: Diagnosis not present

## 2021-10-07 DIAGNOSIS — N2581 Secondary hyperparathyroidism of renal origin: Secondary | ICD-10-CM | POA: Diagnosis not present

## 2021-10-07 DIAGNOSIS — D509 Iron deficiency anemia, unspecified: Secondary | ICD-10-CM | POA: Diagnosis not present

## 2021-10-07 DIAGNOSIS — N186 End stage renal disease: Secondary | ICD-10-CM | POA: Diagnosis not present

## 2021-10-10 DIAGNOSIS — D509 Iron deficiency anemia, unspecified: Secondary | ICD-10-CM | POA: Diagnosis not present

## 2021-10-10 DIAGNOSIS — N2581 Secondary hyperparathyroidism of renal origin: Secondary | ICD-10-CM | POA: Diagnosis not present

## 2021-10-10 DIAGNOSIS — N186 End stage renal disease: Secondary | ICD-10-CM | POA: Diagnosis not present

## 2021-10-10 DIAGNOSIS — D631 Anemia in chronic kidney disease: Secondary | ICD-10-CM | POA: Diagnosis not present

## 2021-10-10 DIAGNOSIS — Z992 Dependence on renal dialysis: Secondary | ICD-10-CM | POA: Diagnosis not present

## 2021-10-10 DIAGNOSIS — N250 Renal osteodystrophy: Secondary | ICD-10-CM | POA: Diagnosis not present

## 2021-10-12 DIAGNOSIS — N2581 Secondary hyperparathyroidism of renal origin: Secondary | ICD-10-CM | POA: Diagnosis not present

## 2021-10-12 DIAGNOSIS — N186 End stage renal disease: Secondary | ICD-10-CM | POA: Diagnosis not present

## 2021-10-12 DIAGNOSIS — D509 Iron deficiency anemia, unspecified: Secondary | ICD-10-CM | POA: Diagnosis not present

## 2021-10-12 DIAGNOSIS — N250 Renal osteodystrophy: Secondary | ICD-10-CM | POA: Diagnosis not present

## 2021-10-12 DIAGNOSIS — Z992 Dependence on renal dialysis: Secondary | ICD-10-CM | POA: Diagnosis not present

## 2021-10-12 DIAGNOSIS — D631 Anemia in chronic kidney disease: Secondary | ICD-10-CM | POA: Diagnosis not present

## 2021-10-14 DIAGNOSIS — N250 Renal osteodystrophy: Secondary | ICD-10-CM | POA: Diagnosis not present

## 2021-10-14 DIAGNOSIS — Z992 Dependence on renal dialysis: Secondary | ICD-10-CM | POA: Diagnosis not present

## 2021-10-14 DIAGNOSIS — N186 End stage renal disease: Secondary | ICD-10-CM | POA: Diagnosis not present

## 2021-10-14 DIAGNOSIS — D509 Iron deficiency anemia, unspecified: Secondary | ICD-10-CM | POA: Diagnosis not present

## 2021-10-14 DIAGNOSIS — D631 Anemia in chronic kidney disease: Secondary | ICD-10-CM | POA: Diagnosis not present

## 2021-10-14 DIAGNOSIS — N2581 Secondary hyperparathyroidism of renal origin: Secondary | ICD-10-CM | POA: Diagnosis not present

## 2021-10-17 DIAGNOSIS — N2581 Secondary hyperparathyroidism of renal origin: Secondary | ICD-10-CM | POA: Diagnosis not present

## 2021-10-17 DIAGNOSIS — Z992 Dependence on renal dialysis: Secondary | ICD-10-CM | POA: Diagnosis not present

## 2021-10-17 DIAGNOSIS — N250 Renal osteodystrophy: Secondary | ICD-10-CM | POA: Diagnosis not present

## 2021-10-17 DIAGNOSIS — D509 Iron deficiency anemia, unspecified: Secondary | ICD-10-CM | POA: Diagnosis not present

## 2021-10-17 DIAGNOSIS — D631 Anemia in chronic kidney disease: Secondary | ICD-10-CM | POA: Diagnosis not present

## 2021-10-17 DIAGNOSIS — E119 Type 2 diabetes mellitus without complications: Secondary | ICD-10-CM | POA: Diagnosis not present

## 2021-10-17 DIAGNOSIS — N186 End stage renal disease: Secondary | ICD-10-CM | POA: Diagnosis not present

## 2021-10-19 DIAGNOSIS — N2581 Secondary hyperparathyroidism of renal origin: Secondary | ICD-10-CM | POA: Diagnosis not present

## 2021-10-19 DIAGNOSIS — D509 Iron deficiency anemia, unspecified: Secondary | ICD-10-CM | POA: Diagnosis not present

## 2021-10-19 DIAGNOSIS — N186 End stage renal disease: Secondary | ICD-10-CM | POA: Diagnosis not present

## 2021-10-19 DIAGNOSIS — N250 Renal osteodystrophy: Secondary | ICD-10-CM | POA: Diagnosis not present

## 2021-10-19 DIAGNOSIS — Z992 Dependence on renal dialysis: Secondary | ICD-10-CM | POA: Diagnosis not present

## 2021-10-19 DIAGNOSIS — D631 Anemia in chronic kidney disease: Secondary | ICD-10-CM | POA: Diagnosis not present

## 2021-10-21 DIAGNOSIS — D631 Anemia in chronic kidney disease: Secondary | ICD-10-CM | POA: Diagnosis not present

## 2021-10-21 DIAGNOSIS — Z992 Dependence on renal dialysis: Secondary | ICD-10-CM | POA: Diagnosis not present

## 2021-10-21 DIAGNOSIS — D509 Iron deficiency anemia, unspecified: Secondary | ICD-10-CM | POA: Diagnosis not present

## 2021-10-21 DIAGNOSIS — N250 Renal osteodystrophy: Secondary | ICD-10-CM | POA: Diagnosis not present

## 2021-10-21 DIAGNOSIS — N186 End stage renal disease: Secondary | ICD-10-CM | POA: Diagnosis not present

## 2021-10-21 DIAGNOSIS — N2581 Secondary hyperparathyroidism of renal origin: Secondary | ICD-10-CM | POA: Diagnosis not present

## 2021-10-24 DIAGNOSIS — N2581 Secondary hyperparathyroidism of renal origin: Secondary | ICD-10-CM | POA: Diagnosis not present

## 2021-10-24 DIAGNOSIS — D509 Iron deficiency anemia, unspecified: Secondary | ICD-10-CM | POA: Diagnosis not present

## 2021-10-24 DIAGNOSIS — N186 End stage renal disease: Secondary | ICD-10-CM | POA: Diagnosis not present

## 2021-10-24 DIAGNOSIS — Z992 Dependence on renal dialysis: Secondary | ICD-10-CM | POA: Diagnosis not present

## 2021-10-24 DIAGNOSIS — N250 Renal osteodystrophy: Secondary | ICD-10-CM | POA: Diagnosis not present

## 2021-10-24 DIAGNOSIS — D631 Anemia in chronic kidney disease: Secondary | ICD-10-CM | POA: Diagnosis not present

## 2021-10-25 DIAGNOSIS — Z20822 Contact with and (suspected) exposure to covid-19: Secondary | ICD-10-CM | POA: Diagnosis not present

## 2021-10-26 DIAGNOSIS — Z992 Dependence on renal dialysis: Secondary | ICD-10-CM | POA: Diagnosis not present

## 2021-10-26 DIAGNOSIS — N2581 Secondary hyperparathyroidism of renal origin: Secondary | ICD-10-CM | POA: Diagnosis not present

## 2021-10-26 DIAGNOSIS — D509 Iron deficiency anemia, unspecified: Secondary | ICD-10-CM | POA: Diagnosis not present

## 2021-10-26 DIAGNOSIS — N250 Renal osteodystrophy: Secondary | ICD-10-CM | POA: Diagnosis not present

## 2021-10-26 DIAGNOSIS — D631 Anemia in chronic kidney disease: Secondary | ICD-10-CM | POA: Diagnosis not present

## 2021-10-26 DIAGNOSIS — N186 End stage renal disease: Secondary | ICD-10-CM | POA: Diagnosis not present

## 2021-10-28 DIAGNOSIS — N250 Renal osteodystrophy: Secondary | ICD-10-CM | POA: Diagnosis not present

## 2021-10-28 DIAGNOSIS — N186 End stage renal disease: Secondary | ICD-10-CM | POA: Diagnosis not present

## 2021-10-28 DIAGNOSIS — D509 Iron deficiency anemia, unspecified: Secondary | ICD-10-CM | POA: Diagnosis not present

## 2021-10-28 DIAGNOSIS — D631 Anemia in chronic kidney disease: Secondary | ICD-10-CM | POA: Diagnosis not present

## 2021-10-28 DIAGNOSIS — N2581 Secondary hyperparathyroidism of renal origin: Secondary | ICD-10-CM | POA: Diagnosis not present

## 2021-10-28 DIAGNOSIS — Z992 Dependence on renal dialysis: Secondary | ICD-10-CM | POA: Diagnosis not present

## 2021-10-31 DIAGNOSIS — N2581 Secondary hyperparathyroidism of renal origin: Secondary | ICD-10-CM | POA: Diagnosis not present

## 2021-10-31 DIAGNOSIS — D509 Iron deficiency anemia, unspecified: Secondary | ICD-10-CM | POA: Diagnosis not present

## 2021-10-31 DIAGNOSIS — N186 End stage renal disease: Secondary | ICD-10-CM | POA: Diagnosis not present

## 2021-10-31 DIAGNOSIS — N250 Renal osteodystrophy: Secondary | ICD-10-CM | POA: Diagnosis not present

## 2021-10-31 DIAGNOSIS — Z992 Dependence on renal dialysis: Secondary | ICD-10-CM | POA: Diagnosis not present

## 2021-10-31 DIAGNOSIS — D631 Anemia in chronic kidney disease: Secondary | ICD-10-CM | POA: Diagnosis not present

## 2021-11-02 DIAGNOSIS — D631 Anemia in chronic kidney disease: Secondary | ICD-10-CM | POA: Diagnosis not present

## 2021-11-02 DIAGNOSIS — D509 Iron deficiency anemia, unspecified: Secondary | ICD-10-CM | POA: Diagnosis not present

## 2021-11-02 DIAGNOSIS — Z992 Dependence on renal dialysis: Secondary | ICD-10-CM | POA: Diagnosis not present

## 2021-11-02 DIAGNOSIS — N2581 Secondary hyperparathyroidism of renal origin: Secondary | ICD-10-CM | POA: Diagnosis not present

## 2021-11-02 DIAGNOSIS — N186 End stage renal disease: Secondary | ICD-10-CM | POA: Diagnosis not present

## 2021-11-02 DIAGNOSIS — N250 Renal osteodystrophy: Secondary | ICD-10-CM | POA: Diagnosis not present

## 2021-11-04 DIAGNOSIS — Z992 Dependence on renal dialysis: Secondary | ICD-10-CM | POA: Diagnosis not present

## 2021-11-04 DIAGNOSIS — N250 Renal osteodystrophy: Secondary | ICD-10-CM | POA: Diagnosis not present

## 2021-11-04 DIAGNOSIS — D509 Iron deficiency anemia, unspecified: Secondary | ICD-10-CM | POA: Diagnosis not present

## 2021-11-04 DIAGNOSIS — D631 Anemia in chronic kidney disease: Secondary | ICD-10-CM | POA: Diagnosis not present

## 2021-11-04 DIAGNOSIS — N186 End stage renal disease: Secondary | ICD-10-CM | POA: Diagnosis not present

## 2021-11-04 DIAGNOSIS — N2581 Secondary hyperparathyroidism of renal origin: Secondary | ICD-10-CM | POA: Diagnosis not present

## 2021-11-06 DIAGNOSIS — Z992 Dependence on renal dialysis: Secondary | ICD-10-CM | POA: Diagnosis not present

## 2021-11-06 DIAGNOSIS — N186 End stage renal disease: Secondary | ICD-10-CM | POA: Diagnosis not present

## 2021-11-07 DIAGNOSIS — N186 End stage renal disease: Secondary | ICD-10-CM | POA: Diagnosis not present

## 2021-11-07 DIAGNOSIS — Z992 Dependence on renal dialysis: Secondary | ICD-10-CM | POA: Diagnosis not present

## 2021-11-07 DIAGNOSIS — E8779 Other fluid overload: Secondary | ICD-10-CM | POA: Diagnosis not present

## 2021-11-07 DIAGNOSIS — D509 Iron deficiency anemia, unspecified: Secondary | ICD-10-CM | POA: Diagnosis not present

## 2021-11-07 DIAGNOSIS — N250 Renal osteodystrophy: Secondary | ICD-10-CM | POA: Diagnosis not present

## 2021-11-07 DIAGNOSIS — N2581 Secondary hyperparathyroidism of renal origin: Secondary | ICD-10-CM | POA: Diagnosis not present

## 2021-11-07 DIAGNOSIS — D631 Anemia in chronic kidney disease: Secondary | ICD-10-CM | POA: Diagnosis not present

## 2021-11-09 DIAGNOSIS — N250 Renal osteodystrophy: Secondary | ICD-10-CM | POA: Diagnosis not present

## 2021-11-09 DIAGNOSIS — D631 Anemia in chronic kidney disease: Secondary | ICD-10-CM | POA: Diagnosis not present

## 2021-11-09 DIAGNOSIS — Z992 Dependence on renal dialysis: Secondary | ICD-10-CM | POA: Diagnosis not present

## 2021-11-09 DIAGNOSIS — N2581 Secondary hyperparathyroidism of renal origin: Secondary | ICD-10-CM | POA: Diagnosis not present

## 2021-11-09 DIAGNOSIS — N186 End stage renal disease: Secondary | ICD-10-CM | POA: Diagnosis not present

## 2021-11-09 DIAGNOSIS — D509 Iron deficiency anemia, unspecified: Secondary | ICD-10-CM | POA: Diagnosis not present

## 2021-11-09 NOTE — Progress Notes (Signed)
? ?BP (!) 150/80   Pulse 92   Temp 97.6 ?F (36.4 ?C) (Oral)   Wt 239 lb 9.6 oz (108.7 kg)   SpO2 97%   BMI 29.01 kg/m?   ? ?Subjective:  ? ? Patient ID: Timothy Gibson, male    DOB: [birth year], 71 y.o.   MRN: [id number] ? ?HPI: ?Timothy Gibson is a 71 y.o. male ? ?Chief Complaint  ?Patient presents with  ? Hyperlipidemia  ? Hypertension  ? Diabetes  ?  Eye exam requested from [facility] .   ? ?HYPERTENSION ?Hypertension status: uncontrolled  ?Satisfied with current treatment? yes ?Duration of hypertension: years ?BP monitoring frequency:   checked frequently at dialysis ?BP range:  ?BP medication side effects:  no ?Medication compliance: excellent compliance ?Previous BP meds: Metoprolol and valsartan ?Aspirin: no ?Recurrent headaches: no ?Visual changes: no ?Palpitations: no ?Dyspnea: no ?Chest pain: no ?Lower extremity edema: no ?Dizzy/lightheaded: no ? ?DIABETES ?Hypoglycemic episodes:no ?Polydipsia/polyuria: no ?Visual disturbance: no ?Chest pain: no ?Paresthesias: no ?Glucose Monitoring: no ? Accucheck frequency: Not Checking ? Fasting glucose: ? Post prandial: ? Evening: ? Before meals: ?Taking Insulin?: yes ? Long acting insulin: [place] 10u ? Short acting insulin: ?Blood Pressure Monitoring:  checked at dialysis ?Retinal Examination: Not up to Date ?Foot Exam: Not up to Date ?Diabetic Education: Not Completed ?Pneumovax: Up to Date ?Influenza: Up to Date ?Aspirin: no ? ?DIALYSIS ?M,W,F.  Patient states he saw Dr. Holley Raring yesterday.  He was pleased with his A1c.  Does not urinate at all.  ? ? ?Relevant past medical, surgical, family and social history reviewed and updated as indicated. Interim medical history since our last visit reviewed. ?Allergies and medications reviewed and updated. ? ?Review of Systems  ?Eyes:  Negative for visual disturbance.  ?Respiratory:  Negative for chest tightness and shortness of breath.   ?Cardiovascular:  Negative for chest pain, palpitations and leg swelling.   ?Endocrine: Negative for polydipsia and polyuria.  ?Neurological:  Negative for dizziness, light-headedness, numbness and headaches.  ? ?Per HPI unless specifically indicated above ? ?   ?Objective:  ?  ?BP (!) 150/80   Pulse 92   Temp 97.6 ?F (36.4 ?C) (Oral)   Wt 239 lb 9.6 oz (108.7 kg)   SpO2 97%   BMI 29.01 kg/m?   ?Wt Readings from Last 3 Encounters:  ?11/10/21 239 lb 9.6 oz (108.7 kg)  ?05/12/21 242 lb 3.2 oz (109.9 kg)  ?04/28/21 247 lb 3.2 oz (112.1 kg)  ?  ?Physical Exam ?Vitals and nursing note reviewed.  ?Constitutional:   ?   General: He is not in acute distress. ?   Appearance: Normal appearance. He is not ill-appearing, toxic-appearing or diaphoretic.  ?HENT:  ?   Head: Normocephalic.  ?   Right Ear: External ear normal.  ?   Left Ear: External ear normal.  ?   Nose: Nose normal. No congestion or rhinorrhea.  ?   Mouth/Throat:  ?   Mouth: Mucous membranes are moist.  ?Eyes:  ?   General:     ?   Right eye: No discharge.     ?   Left eye: No discharge.  ?   Extraocular Movements: Extraocular movements intact.  ?   Conjunctiva/sclera: Conjunctivae normal.  ?   Pupils: Pupils are equal, round, and reactive to light.  ?Cardiovascular:  ?   Rate and Rhythm: Normal rate and regular rhythm.  ?   Heart sounds: No murmur heard. ?Pulmonary:  ?  Effort: Pulmonary effort is normal. No respiratory distress.  ?   Breath sounds: Normal breath sounds. No wheezing, rhonchi or rales.  ?Abdominal:  ?   General: Abdomen is flat. Bowel sounds are normal.  ?Musculoskeletal:  ?   Cervical back: Normal range of motion and neck supple.  ?   Comments: Uses cane  ?Skin: ?   General: Skin is warm and dry.  ?   Capillary Refill: Capillary refill takes less than 2 seconds.  ?Neurological:  ?   General: No focal deficit present.  ?   Mental Status: He is alert and oriented to person, place, and time.  ?Psychiatric:     ?   Mood and Affect: Mood normal.     ?   Behavior: Behavior normal.     ?   Thought Content: Thought  content normal.     ?   Judgment: Judgment normal.  ? ? ?Results for orders placed or performed in visit on 04/28/21  ?Lipid panel  ?Result Value Ref Range  ? Cholesterol, Total 89 (L) 100 - 199 mg/dL  ? Triglycerides 48 0 - 149 mg/dL  ? HDL 43 >39 mg/dL  ? VLDL Cholesterol Cal 12 5 - 40 mg/dL  ? LDL Chol Calc (NIH) 34 0 - 99 mg/dL  ? Chol/HDL Ratio 2.1 0.0 - 5.0 ratio  ?Basic metabolic panel  ?Result Value Ref Range  ? Glucose 89 70 - 99 mg/dL  ? BUN 23 8 - 27 mg/dL  ? Creatinine, Ser 7.59 (H) 0.76 - 1.27 mg/dL  ? eGFR 7 (L) >59 mL/min/1.73  ? BUN/Creatinine Ratio 3 (L) 10 - 24  ? Sodium 145 (H) 134 - 144 mmol/L  ? Potassium 3.8 3.5 - 5.2 mmol/L  ? Chloride 98 96 - 106 mmol/L  ? CO2 30 (H) 20 - 29 mmol/L  ? Calcium 9.1 8.6 - 10.2 mg/dL  ?Bayer DCA Hb A1c Waived  ?Result Value Ref Range  ? HB A1C (BAYER DCA - WAIVED) 5.1 4.8 - 5.6 %  ? ?   ?Assessment & Plan:  ? ?Problem List Items Addressed This Visit   ? ?  ? Cardiovascular and Mediastinum  ? Atherosclerosis of native arteries of extremity with intermittent claudication ([place])  ?  Reviewed labs with patient during visit. Continue with current medication regimen.  Follow up in 6 months.  ? ?  ?  ? Relevant Orders  ? Lipid Profile  ? Hypertension associated with diabetes ([employer]) - Primary  ?  Chronic. Not well controlled.  Patient is on dialysis.  Blood pressure is managed by Nephrology and Cardiology.  States his blood pressure is very low when he leaves dialysis.  Continue per their recommendations.  Asymptomatic at visit today.  ? ?  ?  ? Relevant Orders  ? Comp Met (CMET)  ? Chronic diastolic heart failure ([place])  ?  Chronic.  Followed by Cardiology at [facility] Euvolemic today. Continue current regimen. Continue to monitor. Call with any concerns.  ? ?  ?  ? Atherosclerosis of native arteries of the extremities with ulceration ([place])  ? Relevant Orders  ? Lipid Profile  ?  ? Respiratory  ? COPD (chronic obstructive pulmonary disease) ([place])  ?  Under good  control on current regimen. Continue current regimen of Anoro. Continue to monitor. Call with any concerns. Refills given.  ? ? ?  ?  ?  ? Endocrine  ? Type 2 diabetes mellitus with hyperglycemia, without long-term current use of  insulin ([place])  ?  Chronic.  Controlled.  Last A1c 5.1.  If A1c remains well controlled will stop [place].  Continue with current medication regimen.  Labs ordered today.  Return to clinic in 3 months for reevaluation.  Call sooner if concerns arise.  ? ? ?  ?  ? Relevant Orders  ? HgB A1c  ?  ? Genitourinary  ? End stage renal disease ([place])  ?  Continue with Dialysis.  Continue to follow up with Nephrologist. ? ?  ?  ? Relevant Orders  ? CBC w/Diff  ?  ? ?Follow up plan: ?Return in about 3 months (around 02/10/2022) for HTN, HLD, DM2 FU. ? ? ?A total of 30 minutes were spent on this encounter today.  When total time is documented, this includes both the face-to-face and non-face-to-face time personally spent before, during and after the visit on the date of the encounter reviewing lab results from previous visit, discussing blood pressure elevation and performing foot exam.  ? ? ? ? ?

## 2021-11-10 ENCOUNTER — Encounter: Payer: Self-pay | Admitting: Nurse Practitioner

## 2021-11-10 ENCOUNTER — Ambulatory Visit (INDEPENDENT_AMBULATORY_CARE_PROVIDER_SITE_OTHER): Payer: Medicare Other | Admitting: Nurse Practitioner

## 2021-11-10 VITALS — BP 150/80 | HR 92 | Temp 97.6°F | Wt 239.6 lb

## 2021-11-10 DIAGNOSIS — I70213 Atherosclerosis of native arteries of extremities with intermittent claudication, bilateral legs: Secondary | ICD-10-CM

## 2021-11-10 DIAGNOSIS — E11621 Type 2 diabetes mellitus with foot ulcer: Secondary | ICD-10-CM

## 2021-11-10 DIAGNOSIS — E1159 Type 2 diabetes mellitus with other circulatory complications: Secondary | ICD-10-CM

## 2021-11-10 DIAGNOSIS — J449 Chronic obstructive pulmonary disease, unspecified: Secondary | ICD-10-CM

## 2021-11-10 DIAGNOSIS — I7025 Atherosclerosis of native arteries of other extremities with ulceration: Secondary | ICD-10-CM

## 2021-11-10 DIAGNOSIS — E1165 Type 2 diabetes mellitus with hyperglycemia: Secondary | ICD-10-CM

## 2021-11-10 DIAGNOSIS — N186 End stage renal disease: Secondary | ICD-10-CM | POA: Diagnosis not present

## 2021-11-10 DIAGNOSIS — I152 Hypertension secondary to endocrine disorders: Secondary | ICD-10-CM

## 2021-11-10 DIAGNOSIS — I5032 Chronic diastolic (congestive) heart failure: Secondary | ICD-10-CM | POA: Diagnosis not present

## 2021-11-10 NOTE — Assessment & Plan Note (Signed)
Under good control on current regimen. Continue current regimen of Anoro. Continue to monitor. Call with any concerns. Refills given.  ? ?

## 2021-11-10 NOTE — Assessment & Plan Note (Signed)
Chronic.  Followed by Cardiology at [facility]. Euvolemic today. Continue current regimen. Continue to monitor. Call with any concerns.  ?

## 2021-11-10 NOTE — Assessment & Plan Note (Signed)
Chronic. Not well controlled.  Patient is on dialysis.  Blood pressure is managed by Nephrology and Cardiology.  States his blood pressure is very low when he leaves dialysis.  Continue per their recommendations.  Asymptomatic at visit today.  

## 2021-11-10 NOTE — Assessment & Plan Note (Signed)
Chronic.  Controlled.  Last A1c 5.1.  If A1c remains well controlled will stop [place].  Continue with current medication regimen.  Labs ordered today.  Return to clinic in 3 months for reevaluation.  Call sooner if concerns arise.  ? ?

## 2021-11-10 NOTE — Assessment & Plan Note (Signed)
Continue with Dialysis.  Continue to follow up with Nephrologist. 

## 2021-11-10 NOTE — Assessment & Plan Note (Signed)
Reviewed labs with patient during visit. Continue with current medication regimen.  Follow up in 6 months.  

## 2021-11-11 DIAGNOSIS — N2581 Secondary hyperparathyroidism of renal origin: Secondary | ICD-10-CM | POA: Diagnosis not present

## 2021-11-11 DIAGNOSIS — N186 End stage renal disease: Secondary | ICD-10-CM | POA: Diagnosis not present

## 2021-11-11 DIAGNOSIS — Z992 Dependence on renal dialysis: Secondary | ICD-10-CM | POA: Diagnosis not present

## 2021-11-11 DIAGNOSIS — D509 Iron deficiency anemia, unspecified: Secondary | ICD-10-CM | POA: Diagnosis not present

## 2021-11-11 DIAGNOSIS — N250 Renal osteodystrophy: Secondary | ICD-10-CM | POA: Diagnosis not present

## 2021-11-11 DIAGNOSIS — D631 Anemia in chronic kidney disease: Secondary | ICD-10-CM | POA: Diagnosis not present

## 2021-11-11 LAB — COMPREHENSIVE METABOLIC PANEL
ALT: 10 IU/L (ref 0–44)
AST: 11 IU/L (ref 0–40)
Albumin/Globulin Ratio: 1.2 (ref 1.2–2.2)
Albumin: 3.9 g/dL (ref 3.8–4.8)
Alkaline Phosphatase: 106 IU/L (ref 44–121)
BUN/Creatinine Ratio: 4 — ABNORMAL LOW (ref 10–24)
BUN: 31 mg/dL — ABNORMAL HIGH (ref 8–27)
Bilirubin Total: 0.4 mg/dL (ref 0.0–1.2)
CO2: 29 mmol/L (ref 20–29)
Calcium: 8.7 mg/dL (ref 8.6–10.2)
Chloride: 95 mmol/L — ABNORMAL LOW (ref 96–106)
Creatinine, Ser: 7.9 mg/dL — ABNORMAL HIGH (ref 0.76–1.27)
Globulin, Total: 3.3 g/dL (ref 1.5–4.5)
Glucose: 93 mg/dL (ref 70–99)
Potassium: 3.9 mmol/L (ref 3.5–5.2)
Sodium: 144 mmol/L (ref 134–144)
Total Protein: 7.2 g/dL (ref 6.0–8.5)
eGFR: 7 mL/min/{1.73_m2} — ABNORMAL LOW (ref >59)

## 2021-11-11 LAB — CBC WITH DIFFERENTIAL/PLATELET
Basophils Absolute: 0 10*3/uL (ref 0.0–0.2)
Basos: 1 %
EOS (ABSOLUTE): 0.2 10*3/uL (ref 0.0–0.4)
Eos: 5 %
Hematocrit: 37 % — ABNORMAL LOW (ref 37.5–51.0)
Hemoglobin: 12.7 g/dL — ABNORMAL LOW (ref 13.0–17.7)
Immature Grans (Abs): 0 10*3/uL (ref 0.0–0.1)
Immature Granulocytes: 0 %
Lymphocytes Absolute: 0.7 10*3/uL (ref 0.7–3.1)
Lymphs: 22 %
MCH: 32.9 pg (ref 26.6–33.0)
MCHC: 34.3 g/dL (ref 31.5–35.7)
MCV: 96 fL (ref 79–97)
Monocytes Absolute: 0.3 10*3/uL (ref 0.1–0.9)
Monocytes: 8 %
Neutrophils Absolute: 2 10*3/uL (ref 1.4–7.0)
Neutrophils: 64 %
Platelets: 96 10*3/uL — CL (ref 150–450)
RBC: 3.86 x10E6/uL — ABNORMAL LOW (ref 4.14–5.80)
RDW: 14.5 % (ref 11.6–15.4)
WBC: 3.2 10*3/uL — ABNORMAL LOW (ref 3.4–10.8)

## 2021-11-11 LAB — LIPID PANEL
Chol/HDL Ratio: 3.1 ratio (ref 0.0–5.0)
Cholesterol, Total: 119 mg/dL (ref 100–199)
HDL: 38 mg/dL — ABNORMAL LOW (ref >39)
LDL Chol Calc (NIH): 66 mg/dL (ref 0–99)
Triglycerides: 74 mg/dL (ref 0–149)
VLDL Cholesterol Cal: 15 mg/dL (ref 5–40)

## 2021-11-11 LAB — HEMOGLOBIN A1C
Est. average glucose Bld gHb Est-mCnc: 163 mg/dL
Hgb A1c MFr Bld: 7.3 % — ABNORMAL HIGH (ref 4.8–5.6)

## 2021-11-11 NOTE — Addendum Note (Signed)
Addended by: Jon Billings on: 11/11/2021 11:08 AM ? ? Modules accepted: Orders ? ?

## 2021-11-11 NOTE — Progress Notes (Signed)
Please let patient know that his anemia remains stable.  However, his platelets are very low.  I would like him to come back and repeat this on Tuesday to see if they improve.  I also encourage him to tell the dialysis nurse and nephrologist that his platelet count was 96.   ? ?His A1c also increased to 7.3.  I recommend he decrease his carbohydrate intake to help keep this under control.

## 2021-11-14 DIAGNOSIS — Z992 Dependence on renal dialysis: Secondary | ICD-10-CM | POA: Diagnosis not present

## 2021-11-14 DIAGNOSIS — D631 Anemia in chronic kidney disease: Secondary | ICD-10-CM | POA: Diagnosis not present

## 2021-11-14 DIAGNOSIS — N186 End stage renal disease: Secondary | ICD-10-CM | POA: Diagnosis not present

## 2021-11-14 DIAGNOSIS — D509 Iron deficiency anemia, unspecified: Secondary | ICD-10-CM | POA: Diagnosis not present

## 2021-11-14 DIAGNOSIS — N2581 Secondary hyperparathyroidism of renal origin: Secondary | ICD-10-CM | POA: Diagnosis not present

## 2021-11-14 DIAGNOSIS — N250 Renal osteodystrophy: Secondary | ICD-10-CM | POA: Diagnosis not present

## 2021-11-16 ENCOUNTER — Other Ambulatory Visit: Payer: Medicare Other

## 2021-11-16 DIAGNOSIS — D509 Iron deficiency anemia, unspecified: Secondary | ICD-10-CM | POA: Diagnosis not present

## 2021-11-16 DIAGNOSIS — N250 Renal osteodystrophy: Secondary | ICD-10-CM | POA: Diagnosis not present

## 2021-11-16 DIAGNOSIS — D631 Anemia in chronic kidney disease: Secondary | ICD-10-CM | POA: Diagnosis not present

## 2021-11-16 DIAGNOSIS — Z992 Dependence on renal dialysis: Secondary | ICD-10-CM | POA: Diagnosis not present

## 2021-11-16 DIAGNOSIS — N186 End stage renal disease: Secondary | ICD-10-CM | POA: Diagnosis not present

## 2021-11-16 DIAGNOSIS — N2581 Secondary hyperparathyroidism of renal origin: Secondary | ICD-10-CM | POA: Diagnosis not present

## 2021-11-17 ENCOUNTER — Other Ambulatory Visit: Payer: Medicare Other

## 2021-11-17 DIAGNOSIS — N186 End stage renal disease: Secondary | ICD-10-CM | POA: Diagnosis not present

## 2021-11-18 DIAGNOSIS — D631 Anemia in chronic kidney disease: Secondary | ICD-10-CM | POA: Diagnosis not present

## 2021-11-18 DIAGNOSIS — Z992 Dependence on renal dialysis: Secondary | ICD-10-CM | POA: Diagnosis not present

## 2021-11-18 DIAGNOSIS — N2581 Secondary hyperparathyroidism of renal origin: Secondary | ICD-10-CM | POA: Diagnosis not present

## 2021-11-18 DIAGNOSIS — N250 Renal osteodystrophy: Secondary | ICD-10-CM | POA: Diagnosis not present

## 2021-11-18 DIAGNOSIS — D509 Iron deficiency anemia, unspecified: Secondary | ICD-10-CM | POA: Diagnosis not present

## 2021-11-18 DIAGNOSIS — N186 End stage renal disease: Secondary | ICD-10-CM | POA: Diagnosis not present

## 2021-11-18 LAB — CBC WITH DIFFERENTIAL/PLATELET
Basophils Absolute: 0 10*3/uL (ref 0.0–0.2)
Basos: 1 %
EOS (ABSOLUTE): 0.3 10*3/uL (ref 0.0–0.4)
Eos: 8 %
Hematocrit: 36.2 % — ABNORMAL LOW (ref 37.5–51.0)
Hemoglobin: 12.1 g/dL — ABNORMAL LOW (ref 13.0–17.7)
Immature Grans (Abs): 0 10*3/uL (ref 0.0–0.1)
Immature Granulocytes: 0 %
Lymphocytes Absolute: 0.9 10*3/uL (ref 0.7–3.1)
Lymphs: 27 %
MCH: 32.4 pg (ref 26.6–33.0)
MCHC: 33.4 g/dL (ref 31.5–35.7)
MCV: 97 fL (ref 79–97)
Monocytes Absolute: 0.4 10*3/uL (ref 0.1–0.9)
Monocytes: 11 %
Neutrophils Absolute: 1.8 10*3/uL (ref 1.4–7.0)
Neutrophils: 53 %
Platelets: 101 10*3/uL — ABNORMAL LOW (ref 150–450)
RBC: 3.74 x10E6/uL — ABNORMAL LOW (ref 4.14–5.80)
RDW: 13.9 % (ref 11.6–15.4)
WBC: 3.4 10*3/uL (ref 3.4–10.8)

## 2021-11-18 NOTE — Progress Notes (Signed)
Please let know that his platelets improved very slightly. Make sure to discuss his low platelets with the nephrologist at dialysis.

## 2021-11-21 DIAGNOSIS — D631 Anemia in chronic kidney disease: Secondary | ICD-10-CM | POA: Diagnosis not present

## 2021-11-21 DIAGNOSIS — N186 End stage renal disease: Secondary | ICD-10-CM | POA: Diagnosis not present

## 2021-11-21 DIAGNOSIS — Z992 Dependence on renal dialysis: Secondary | ICD-10-CM | POA: Diagnosis not present

## 2021-11-21 DIAGNOSIS — N250 Renal osteodystrophy: Secondary | ICD-10-CM | POA: Diagnosis not present

## 2021-11-21 DIAGNOSIS — D509 Iron deficiency anemia, unspecified: Secondary | ICD-10-CM | POA: Diagnosis not present

## 2021-11-21 DIAGNOSIS — N2581 Secondary hyperparathyroidism of renal origin: Secondary | ICD-10-CM | POA: Diagnosis not present

## 2021-11-22 DIAGNOSIS — D509 Iron deficiency anemia, unspecified: Secondary | ICD-10-CM | POA: Diagnosis not present

## 2021-11-22 DIAGNOSIS — Z992 Dependence on renal dialysis: Secondary | ICD-10-CM | POA: Diagnosis not present

## 2021-11-22 DIAGNOSIS — N250 Renal osteodystrophy: Secondary | ICD-10-CM | POA: Diagnosis not present

## 2021-11-22 DIAGNOSIS — N2581 Secondary hyperparathyroidism of renal origin: Secondary | ICD-10-CM | POA: Diagnosis not present

## 2021-11-22 DIAGNOSIS — D631 Anemia in chronic kidney disease: Secondary | ICD-10-CM | POA: Diagnosis not present

## 2021-11-22 DIAGNOSIS — N186 End stage renal disease: Secondary | ICD-10-CM | POA: Diagnosis not present

## 2021-11-23 DIAGNOSIS — Z992 Dependence on renal dialysis: Secondary | ICD-10-CM | POA: Diagnosis not present

## 2021-11-23 DIAGNOSIS — N186 End stage renal disease: Secondary | ICD-10-CM | POA: Diagnosis not present

## 2021-11-23 DIAGNOSIS — N2581 Secondary hyperparathyroidism of renal origin: Secondary | ICD-10-CM | POA: Diagnosis not present

## 2021-11-23 DIAGNOSIS — N250 Renal osteodystrophy: Secondary | ICD-10-CM | POA: Diagnosis not present

## 2021-11-23 DIAGNOSIS — D631 Anemia in chronic kidney disease: Secondary | ICD-10-CM | POA: Diagnosis not present

## 2021-11-23 DIAGNOSIS — D509 Iron deficiency anemia, unspecified: Secondary | ICD-10-CM | POA: Diagnosis not present

## 2021-11-25 DIAGNOSIS — D631 Anemia in chronic kidney disease: Secondary | ICD-10-CM | POA: Diagnosis not present

## 2021-11-25 DIAGNOSIS — Z992 Dependence on renal dialysis: Secondary | ICD-10-CM | POA: Diagnosis not present

## 2021-11-25 DIAGNOSIS — N250 Renal osteodystrophy: Secondary | ICD-10-CM | POA: Diagnosis not present

## 2021-11-25 DIAGNOSIS — N186 End stage renal disease: Secondary | ICD-10-CM | POA: Diagnosis not present

## 2021-11-25 DIAGNOSIS — D509 Iron deficiency anemia, unspecified: Secondary | ICD-10-CM | POA: Diagnosis not present

## 2021-11-25 DIAGNOSIS — N2581 Secondary hyperparathyroidism of renal origin: Secondary | ICD-10-CM | POA: Diagnosis not present

## 2021-11-28 DIAGNOSIS — D631 Anemia in chronic kidney disease: Secondary | ICD-10-CM | POA: Diagnosis not present

## 2021-11-28 DIAGNOSIS — N250 Renal osteodystrophy: Secondary | ICD-10-CM | POA: Diagnosis not present

## 2021-11-28 DIAGNOSIS — D509 Iron deficiency anemia, unspecified: Secondary | ICD-10-CM | POA: Diagnosis not present

## 2021-11-28 DIAGNOSIS — N186 End stage renal disease: Secondary | ICD-10-CM | POA: Diagnosis not present

## 2021-11-28 DIAGNOSIS — N2581 Secondary hyperparathyroidism of renal origin: Secondary | ICD-10-CM | POA: Diagnosis not present

## 2021-11-28 DIAGNOSIS — Z992 Dependence on renal dialysis: Secondary | ICD-10-CM | POA: Diagnosis not present

## 2021-11-30 DIAGNOSIS — D631 Anemia in chronic kidney disease: Secondary | ICD-10-CM | POA: Diagnosis not present

## 2021-11-30 DIAGNOSIS — D509 Iron deficiency anemia, unspecified: Secondary | ICD-10-CM | POA: Diagnosis not present

## 2021-11-30 DIAGNOSIS — N2581 Secondary hyperparathyroidism of renal origin: Secondary | ICD-10-CM | POA: Diagnosis not present

## 2021-11-30 DIAGNOSIS — N250 Renal osteodystrophy: Secondary | ICD-10-CM | POA: Diagnosis not present

## 2021-11-30 DIAGNOSIS — N186 End stage renal disease: Secondary | ICD-10-CM | POA: Diagnosis not present

## 2021-11-30 DIAGNOSIS — Z992 Dependence on renal dialysis: Secondary | ICD-10-CM | POA: Diagnosis not present

## 2021-12-02 ENCOUNTER — Encounter: Payer: Self-pay | Admitting: Nurse Practitioner

## 2021-12-02 ENCOUNTER — Ambulatory Visit (INDEPENDENT_AMBULATORY_CARE_PROVIDER_SITE_OTHER): Payer: Medicare Other | Admitting: Nurse Practitioner

## 2021-12-02 DIAGNOSIS — H1033 Unspecified acute conjunctivitis, bilateral: Secondary | ICD-10-CM | POA: Diagnosis not present

## 2021-12-02 DIAGNOSIS — D509 Iron deficiency anemia, unspecified: Secondary | ICD-10-CM | POA: Diagnosis not present

## 2021-12-02 DIAGNOSIS — N186 End stage renal disease: Secondary | ICD-10-CM | POA: Diagnosis not present

## 2021-12-02 DIAGNOSIS — N250 Renal osteodystrophy: Secondary | ICD-10-CM | POA: Diagnosis not present

## 2021-12-02 DIAGNOSIS — N2581 Secondary hyperparathyroidism of renal origin: Secondary | ICD-10-CM | POA: Diagnosis not present

## 2021-12-02 DIAGNOSIS — H109 Unspecified conjunctivitis: Secondary | ICD-10-CM | POA: Insufficient documentation

## 2021-12-02 DIAGNOSIS — Z992 Dependence on renal dialysis: Secondary | ICD-10-CM | POA: Diagnosis not present

## 2021-12-02 DIAGNOSIS — D631 Anemia in chronic kidney disease: Secondary | ICD-10-CM | POA: Diagnosis not present

## 2021-12-02 MED ORDER — ERYTHROMYCIN 5 MG/GM OP OINT: 1.0000 "application " | TOPICAL_OINTMENT | Freq: Every day | OPHTHALMIC | 0 refills | Status: AC

## 2021-12-02 NOTE — Assessment & Plan Note (Signed)
Acute to bilateral eyes.  Recommend he start renal dose Claritin 10 MG every 48 hours for symptom relief.  Script for Erythromycin ointment sent in for treatment and educated on how to use this.  No red flags on exam.  Wash hands well and avoid touching eyes.  Return for worsening or ongoing.

## 2021-12-02 NOTE — Patient Instructions (Signed)
Start taking Claritin 10 MG every 48 hours -- renal dosing.  Bacterial Conjunctivitis, Adult Bacterial conjunctivitis is an infection of your conjunctiva. This is the clear membrane that covers the white part of your eye and the inner part of your eyelid. This infection can make your eye: Red or pink. Itchy or irritated. This condition spreads easily from person to person (is contagious) and from one eye to the other eye. What are the causes? This condition is caused by germs (bacteria). You may get the infection if you come into close contact with: A person who has the infection. Items that have germs on them (are contaminated), such as face towels, contact lens solution, or eye makeup. What increases the risk? You are more likely to get this condition if: You have contact with people who have the infection. You wear contact lenses. You have a sinus infection. You have had a recent eye injury or surgery. You have a weak body defense system (immune system). You have dry eyes. What are the signs or symptoms?  Thick, yellowish discharge from the eye. Tearing or watery eyes. Itchy eyes. Burning feeling in your eyes. Eye redness. Swollen eyelids. Blurred vision. How is this treated?  Antibiotic eye drops or ointment. Antibiotic medicine taken by mouth. This is used for infections that do not get better with drops or ointment or that last more than 10 days. Cool, wet cloths placed on the eyes. Artificial tears used 2-6 times a day. Follow these instructions at home: Medicines Take or apply your antibiotic medicine as told by your doctor. Do not stop using it even if you start to feel better. Take or apply over-the-counter and prescription medicines only as told by your doctor. Do not touch your eyelid with the eye-drop bottle or the ointment tube. Managing discomfort Wipe any fluid from your eye with a warm, wet washcloth or a cotton ball. Place a clean, cool, wet cloth on your  eye. Do this for 10-20 minutes, 3-4 times a day. General instructions Do not wear contacts until the infection is gone. Wear glasses until your doctor says it is okay to wear contacts again. Do not wear eye makeup until the infection is gone. Throw away old eye makeup. Change or wash your pillowcase every day. Do not share towels or washcloths. Wash your hands often with soap and water for at least 20 seconds and especially before touching your face or eyes. Use paper towels to dry your hands. Do not touch or rub your eyes. Do not drive or use heavy machinery if your vision is blurred. Contact a doctor if: You have a fever. You do not get better after 10 days. Get help right away if: You have a fever and your symptoms get worse all of a sudden. You have very bad pain when you move your eye. Your face: Hurts. Is red. Is swollen. You have sudden loss of vision. Summary Bacterial conjunctivitis is an infection of your conjunctiva. This infection spreads easily from person to person. Wash your hands often with soap and water for at least 20 seconds and especially before touching your face or eyes. Use paper towels to dry your hands. Take or apply your antibiotic medicine as told by your doctor. Contact a doctor if you have a fever or you do not get better after 10 days. This information is not intended to replace advice given to you by your health care provider. Make sure you discuss any questions you have with your health  care provider. Document Revised: 10/06/2020 Document Reviewed: 10/06/2020 Elsevier Patient Education  [place].

## 2021-12-02 NOTE — Progress Notes (Signed)
BP 108/64 (BP Location: Left Arm, Patient Position: Sitting, Cuff Size: Normal)   Pulse 72   Temp 98 F (36.7 C)   Resp 18   Ht 6' 4.2" (1.935 m)   Wt 247 lb 3.2 oz (112.1 kg)   SpO2 97%   BMI 29.93 kg/m    Subjective:    Patient ID: Timothy Gibson, male    DOB: 1951-05-09, 71 y.o.   MRN: [id number]  HPI: Timothy Gibson is a 71 y.o. male  Chief Complaint  Patient presents with   Conjunctivitis    Patient is here for possible pink eye. Patient states Wednesday he noticed in his L eye wouldn't open up when he got up to go to his Dialysis appointment. Patient states once he got his eye open, he then noticed a film over his eye. Patient states this morning when he woke up both eyes we stuck together. Patient has tried OTC Visine Allergy.    EYE DRAINAGE Got up Wednesday and went to open eyes to go to dialysis, they were stuck together with drainage.  Yesterday the other eye started to show symptoms.  Sticky film over eyes.  Has been around his grandson, no pink eye.   Duration:  days Involved eye:  bilateral Onset: sudden Foreign body sensation:yes to right eye Visual impairment: a little Eye redness: yes Discharge: yes Crusting or matting of eyelids: yes Swelling: no Photophobia: no Itching: yes Tearing: yes Headache: no Floaters: no URI symptoms: no Contact lens use: no Close contacts with similar problems: no Eye trauma: no Aggravating factors: unknown Alleviating factors: nothing Status: stable Treatments attempted: eye drops OTC  Relevant past medical, surgical, family and social history reviewed and updated as indicated. Interim medical history since our last visit reviewed. Allergies and medications reviewed and updated.  Review of Systems  Constitutional:  Negative for activity change, chills, diaphoresis, fatigue and fever.  Eyes:  Positive for photophobia, discharge, redness and itching. Negative for pain.  Respiratory:  Negative for cough, chest  tightness, shortness of breath and wheezing.   Cardiovascular:  Negative for chest pain, palpitations and leg swelling.  Psychiatric/Behavioral: Negative.     Per HPI unless specifically indicated above     Objective:    BP 108/64 (BP Location: Left Arm, Patient Position: Sitting, Cuff Size: Normal)   Pulse 72   Temp 98 F (36.7 C)   Resp 18   Ht 6' 4.2" (1.935 m)   Wt 247 lb 3.2 oz (112.1 kg)   SpO2 97%   BMI 29.93 kg/m   Wt Readings from Last 3 Encounters:  12/02/21 247 lb 3.2 oz (112.1 kg)  11/10/21 239 lb 9.6 oz (108.7 kg)  05/12/21 242 lb 3.2 oz (109.9 kg)    Physical Exam Vitals and nursing note reviewed.  Constitutional:      General: He is awake. He is not in acute distress.    Appearance: He is well-developed and well-groomed. He is not ill-appearing or toxic-appearing.  HENT:     Head: Normocephalic and atraumatic.     Right Ear: Hearing, tympanic membrane, ear canal and external ear normal. No drainage.     Left Ear: Hearing, tympanic membrane, ear canal and external ear normal. No drainage.     Nose: Nose normal. No rhinorrhea.     Right Sinus: No maxillary sinus tenderness or frontal sinus tenderness.     Left Sinus: No maxillary sinus tenderness or frontal sinus tenderness.     Mouth/Throat:  Mouth: Mucous membranes are moist.     Pharynx: Uvula midline.  Eyes:     General: Lids are normal. No visual field deficit or scleral icterus.       Right eye: Discharge present. No foreign body or hordeolum.        Left eye: Discharge present.No foreign body or hordeolum.     Extraocular Movements: Extraocular movements intact.     Conjunctiva/sclera:     Right eye: Right conjunctiva is injected.     Left eye: Left conjunctiva is injected.     Pupils: Pupils are equal, round, and reactive to light.     Visual Fields: Right eye visual fields normal and left eye visual fields normal.  Neck:     Thyroid: No thyromegaly.     Vascular: No carotid bruit.   Cardiovascular:     Rate and Rhythm: Normal rate and regular rhythm.     Heart sounds: Normal heart sounds, S1 normal and S2 normal. No murmur heard.   No gallop.     Arteriovenous access: Left arteriovenous access is present. Pulmonary:     Effort: Pulmonary effort is normal. No accessory muscle usage or respiratory distress.     Breath sounds: Normal breath sounds.  Abdominal:     General: Bowel sounds are normal.     Palpations: Abdomen is soft. There is no hepatomegaly or splenomegaly.  Musculoskeletal:        General: Normal range of motion.     Cervical back: Normal range of motion and neck supple.     Right lower leg: No edema.     Left lower leg: No edema.  Lymphadenopathy:     Cervical: No cervical adenopathy.  Skin:    General: Skin is warm and dry.     Capillary Refill: Capillary refill takes less than 2 seconds.  Neurological:     Mental Status: He is alert and oriented to person, place, and time.  Psychiatric:        Attention and Perception: Attention normal.        Mood and Affect: Mood normal.        Speech: Speech normal.        Behavior: Behavior normal. Behavior is cooperative.        Thought Content: Thought content normal.    Results for orders placed or performed in visit on 11/17/21  CBC w/Diff  Result Value Ref Range   WBC 3.4 3.4 - 10.8 x10E3/uL   RBC 3.74 (L) 4.14 - 5.80 x10E6/uL   Hemoglobin 12.1 (L) 13.0 - 17.7 g/dL   Hematocrit 36.2 (L) 37.5 - 51.0 %   MCV 97 79 - 97 fL   MCH 32.4 26.6 - 33.0 pg   MCHC 33.4 31.5 - 35.7 g/dL   RDW 13.9 11.6 - 15.4 %   Platelets 101 (L) 150 - 450 x10E3/uL   Neutrophils 53 Not Estab. %   Lymphs 27 Not Estab. %   Monocytes 11 Not Estab. %   Eos 8 Not Estab. %   Basos 1 Not Estab. %   Neutrophils Absolute 1.8 1.4 - 7.0 x10E3/uL   Lymphocytes Absolute 0.9 0.7 - 3.1 x10E3/uL   Monocytes Absolute 0.4 0.1 - 0.9 x10E3/uL   EOS (ABSOLUTE) 0.3 0.0 - 0.4 x10E3/uL   Basophils Absolute 0.0 0.0 - 0.2 x10E3/uL    Immature Granulocytes 0 Not Estab. %   Immature Grans (Abs) 0.0 0.0 - 0.1 x10E3/uL      Assessment & Plan:  Problem List Items Addressed This Visit       Other   Conjunctivitis    Acute to bilateral eyes.  Recommend he start renal dose Claritin 10 MG every 48 hours for symptom relief.  Script for Erythromycin ointment sent in for treatment and educated on how to use this.  No red flags on exam.  Wash hands well and avoid touching eyes.  Return for worsening or ongoing.           Follow up plan: Return if symptoms worsen or fail to improve.

## 2021-12-05 DIAGNOSIS — N250 Renal osteodystrophy: Secondary | ICD-10-CM | POA: Diagnosis not present

## 2021-12-05 DIAGNOSIS — N186 End stage renal disease: Secondary | ICD-10-CM | POA: Diagnosis not present

## 2021-12-05 DIAGNOSIS — Z992 Dependence on renal dialysis: Secondary | ICD-10-CM | POA: Diagnosis not present

## 2021-12-05 DIAGNOSIS — D631 Anemia in chronic kidney disease: Secondary | ICD-10-CM | POA: Diagnosis not present

## 2021-12-05 DIAGNOSIS — D509 Iron deficiency anemia, unspecified: Secondary | ICD-10-CM | POA: Diagnosis not present

## 2021-12-05 DIAGNOSIS — N2581 Secondary hyperparathyroidism of renal origin: Secondary | ICD-10-CM | POA: Diagnosis not present

## 2021-12-07 DIAGNOSIS — N2581 Secondary hyperparathyroidism of renal origin: Secondary | ICD-10-CM | POA: Diagnosis not present

## 2021-12-07 DIAGNOSIS — D509 Iron deficiency anemia, unspecified: Secondary | ICD-10-CM | POA: Diagnosis not present

## 2021-12-07 DIAGNOSIS — D631 Anemia in chronic kidney disease: Secondary | ICD-10-CM | POA: Diagnosis not present

## 2021-12-07 DIAGNOSIS — N250 Renal osteodystrophy: Secondary | ICD-10-CM | POA: Diagnosis not present

## 2021-12-07 DIAGNOSIS — Z992 Dependence on renal dialysis: Secondary | ICD-10-CM | POA: Diagnosis not present

## 2021-12-07 DIAGNOSIS — N186 End stage renal disease: Secondary | ICD-10-CM | POA: Diagnosis not present

## 2021-12-09 DIAGNOSIS — Z992 Dependence on renal dialysis: Secondary | ICD-10-CM | POA: Diagnosis not present

## 2021-12-09 DIAGNOSIS — N2581 Secondary hyperparathyroidism of renal origin: Secondary | ICD-10-CM | POA: Diagnosis not present

## 2021-12-09 DIAGNOSIS — D509 Iron deficiency anemia, unspecified: Secondary | ICD-10-CM | POA: Diagnosis not present

## 2021-12-09 DIAGNOSIS — N250 Renal osteodystrophy: Secondary | ICD-10-CM | POA: Diagnosis not present

## 2021-12-09 DIAGNOSIS — N186 End stage renal disease: Secondary | ICD-10-CM | POA: Diagnosis not present

## 2021-12-09 DIAGNOSIS — D631 Anemia in chronic kidney disease: Secondary | ICD-10-CM | POA: Diagnosis not present

## 2021-12-12 DIAGNOSIS — N250 Renal osteodystrophy: Secondary | ICD-10-CM | POA: Diagnosis not present

## 2021-12-12 DIAGNOSIS — D631 Anemia in chronic kidney disease: Secondary | ICD-10-CM | POA: Diagnosis not present

## 2021-12-12 DIAGNOSIS — N2581 Secondary hyperparathyroidism of renal origin: Secondary | ICD-10-CM | POA: Diagnosis not present

## 2021-12-12 DIAGNOSIS — N186 End stage renal disease: Secondary | ICD-10-CM | POA: Diagnosis not present

## 2021-12-12 DIAGNOSIS — Z992 Dependence on renal dialysis: Secondary | ICD-10-CM | POA: Diagnosis not present

## 2021-12-12 DIAGNOSIS — D509 Iron deficiency anemia, unspecified: Secondary | ICD-10-CM | POA: Diagnosis not present

## 2021-12-14 DIAGNOSIS — N250 Renal osteodystrophy: Secondary | ICD-10-CM | POA: Diagnosis not present

## 2021-12-14 DIAGNOSIS — N186 End stage renal disease: Secondary | ICD-10-CM | POA: Diagnosis not present

## 2021-12-14 DIAGNOSIS — N2581 Secondary hyperparathyroidism of renal origin: Secondary | ICD-10-CM | POA: Diagnosis not present

## 2021-12-14 DIAGNOSIS — Z992 Dependence on renal dialysis: Secondary | ICD-10-CM | POA: Diagnosis not present

## 2021-12-14 DIAGNOSIS — D631 Anemia in chronic kidney disease: Secondary | ICD-10-CM | POA: Diagnosis not present

## 2021-12-14 DIAGNOSIS — D509 Iron deficiency anemia, unspecified: Secondary | ICD-10-CM | POA: Diagnosis not present

## 2021-12-16 DIAGNOSIS — Z992 Dependence on renal dialysis: Secondary | ICD-10-CM | POA: Diagnosis not present

## 2021-12-16 DIAGNOSIS — D509 Iron deficiency anemia, unspecified: Secondary | ICD-10-CM | POA: Diagnosis not present

## 2021-12-16 DIAGNOSIS — N250 Renal osteodystrophy: Secondary | ICD-10-CM | POA: Diagnosis not present

## 2021-12-16 DIAGNOSIS — D631 Anemia in chronic kidney disease: Secondary | ICD-10-CM | POA: Diagnosis not present

## 2021-12-16 DIAGNOSIS — N186 End stage renal disease: Secondary | ICD-10-CM | POA: Diagnosis not present

## 2021-12-16 DIAGNOSIS — N2581 Secondary hyperparathyroidism of renal origin: Secondary | ICD-10-CM | POA: Diagnosis not present

## 2021-12-19 DIAGNOSIS — N250 Renal osteodystrophy: Secondary | ICD-10-CM | POA: Diagnosis not present

## 2021-12-19 DIAGNOSIS — N2581 Secondary hyperparathyroidism of renal origin: Secondary | ICD-10-CM | POA: Diagnosis not present

## 2021-12-19 DIAGNOSIS — D509 Iron deficiency anemia, unspecified: Secondary | ICD-10-CM | POA: Diagnosis not present

## 2021-12-19 DIAGNOSIS — N186 End stage renal disease: Secondary | ICD-10-CM | POA: Diagnosis not present

## 2021-12-19 DIAGNOSIS — Z992 Dependence on renal dialysis: Secondary | ICD-10-CM | POA: Diagnosis not present

## 2021-12-19 DIAGNOSIS — D631 Anemia in chronic kidney disease: Secondary | ICD-10-CM | POA: Diagnosis not present

## 2021-12-21 DIAGNOSIS — Z992 Dependence on renal dialysis: Secondary | ICD-10-CM | POA: Diagnosis not present

## 2021-12-21 DIAGNOSIS — N186 End stage renal disease: Secondary | ICD-10-CM | POA: Diagnosis not present

## 2021-12-21 DIAGNOSIS — D631 Anemia in chronic kidney disease: Secondary | ICD-10-CM | POA: Diagnosis not present

## 2021-12-21 DIAGNOSIS — N2581 Secondary hyperparathyroidism of renal origin: Secondary | ICD-10-CM | POA: Diagnosis not present

## 2021-12-21 DIAGNOSIS — N250 Renal osteodystrophy: Secondary | ICD-10-CM | POA: Diagnosis not present

## 2021-12-21 DIAGNOSIS — D509 Iron deficiency anemia, unspecified: Secondary | ICD-10-CM | POA: Diagnosis not present

## 2021-12-23 DIAGNOSIS — N250 Renal osteodystrophy: Secondary | ICD-10-CM | POA: Diagnosis not present

## 2021-12-23 DIAGNOSIS — N186 End stage renal disease: Secondary | ICD-10-CM | POA: Diagnosis not present

## 2021-12-23 DIAGNOSIS — Z992 Dependence on renal dialysis: Secondary | ICD-10-CM | POA: Diagnosis not present

## 2021-12-23 DIAGNOSIS — D509 Iron deficiency anemia, unspecified: Secondary | ICD-10-CM | POA: Diagnosis not present

## 2021-12-23 DIAGNOSIS — N2581 Secondary hyperparathyroidism of renal origin: Secondary | ICD-10-CM | POA: Diagnosis not present

## 2021-12-23 DIAGNOSIS — D631 Anemia in chronic kidney disease: Secondary | ICD-10-CM | POA: Diagnosis not present

## 2021-12-26 DIAGNOSIS — Z992 Dependence on renal dialysis: Secondary | ICD-10-CM | POA: Diagnosis not present

## 2021-12-26 DIAGNOSIS — N250 Renal osteodystrophy: Secondary | ICD-10-CM | POA: Diagnosis not present

## 2021-12-26 DIAGNOSIS — N2581 Secondary hyperparathyroidism of renal origin: Secondary | ICD-10-CM | POA: Diagnosis not present

## 2021-12-26 DIAGNOSIS — D631 Anemia in chronic kidney disease: Secondary | ICD-10-CM | POA: Diagnosis not present

## 2021-12-26 DIAGNOSIS — N186 End stage renal disease: Secondary | ICD-10-CM | POA: Diagnosis not present

## 2021-12-26 DIAGNOSIS — D509 Iron deficiency anemia, unspecified: Secondary | ICD-10-CM | POA: Diagnosis not present

## 2021-12-28 DIAGNOSIS — Z992 Dependence on renal dialysis: Secondary | ICD-10-CM | POA: Diagnosis not present

## 2021-12-28 DIAGNOSIS — N250 Renal osteodystrophy: Secondary | ICD-10-CM | POA: Diagnosis not present

## 2021-12-28 DIAGNOSIS — N2581 Secondary hyperparathyroidism of renal origin: Secondary | ICD-10-CM | POA: Diagnosis not present

## 2021-12-28 DIAGNOSIS — D509 Iron deficiency anemia, unspecified: Secondary | ICD-10-CM | POA: Diagnosis not present

## 2021-12-28 DIAGNOSIS — D631 Anemia in chronic kidney disease: Secondary | ICD-10-CM | POA: Diagnosis not present

## 2021-12-28 DIAGNOSIS — N186 End stage renal disease: Secondary | ICD-10-CM | POA: Diagnosis not present

## 2021-12-30 DIAGNOSIS — D509 Iron deficiency anemia, unspecified: Secondary | ICD-10-CM | POA: Diagnosis not present

## 2021-12-30 DIAGNOSIS — N2581 Secondary hyperparathyroidism of renal origin: Secondary | ICD-10-CM | POA: Diagnosis not present

## 2021-12-30 DIAGNOSIS — D631 Anemia in chronic kidney disease: Secondary | ICD-10-CM | POA: Diagnosis not present

## 2021-12-30 DIAGNOSIS — N186 End stage renal disease: Secondary | ICD-10-CM | POA: Diagnosis not present

## 2021-12-30 DIAGNOSIS — Z992 Dependence on renal dialysis: Secondary | ICD-10-CM | POA: Diagnosis not present

## 2021-12-30 DIAGNOSIS — N250 Renal osteodystrophy: Secondary | ICD-10-CM | POA: Diagnosis not present

## 2022-01-02 DIAGNOSIS — N250 Renal osteodystrophy: Secondary | ICD-10-CM | POA: Diagnosis not present

## 2022-01-02 DIAGNOSIS — D509 Iron deficiency anemia, unspecified: Secondary | ICD-10-CM | POA: Diagnosis not present

## 2022-01-02 DIAGNOSIS — Z992 Dependence on renal dialysis: Secondary | ICD-10-CM | POA: Diagnosis not present

## 2022-01-02 DIAGNOSIS — N2581 Secondary hyperparathyroidism of renal origin: Secondary | ICD-10-CM | POA: Diagnosis not present

## 2022-01-02 DIAGNOSIS — N186 End stage renal disease: Secondary | ICD-10-CM | POA: Diagnosis not present

## 2022-01-02 DIAGNOSIS — D631 Anemia in chronic kidney disease: Secondary | ICD-10-CM | POA: Diagnosis not present

## 2022-01-04 DIAGNOSIS — Z992 Dependence on renal dialysis: Secondary | ICD-10-CM | POA: Diagnosis not present

## 2022-01-04 DIAGNOSIS — D631 Anemia in chronic kidney disease: Secondary | ICD-10-CM | POA: Diagnosis not present

## 2022-01-04 DIAGNOSIS — D509 Iron deficiency anemia, unspecified: Secondary | ICD-10-CM | POA: Diagnosis not present

## 2022-01-04 DIAGNOSIS — N250 Renal osteodystrophy: Secondary | ICD-10-CM | POA: Diagnosis not present

## 2022-01-04 DIAGNOSIS — N186 End stage renal disease: Secondary | ICD-10-CM | POA: Diagnosis not present

## 2022-01-04 DIAGNOSIS — N2581 Secondary hyperparathyroidism of renal origin: Secondary | ICD-10-CM | POA: Diagnosis not present

## 2022-01-06 DIAGNOSIS — D631 Anemia in chronic kidney disease: Secondary | ICD-10-CM | POA: Diagnosis not present

## 2022-01-06 DIAGNOSIS — N2581 Secondary hyperparathyroidism of renal origin: Secondary | ICD-10-CM | POA: Diagnosis not present

## 2022-01-06 DIAGNOSIS — N186 End stage renal disease: Secondary | ICD-10-CM | POA: Diagnosis not present

## 2022-01-06 DIAGNOSIS — D509 Iron deficiency anemia, unspecified: Secondary | ICD-10-CM | POA: Diagnosis not present

## 2022-01-06 DIAGNOSIS — N250 Renal osteodystrophy: Secondary | ICD-10-CM | POA: Diagnosis not present

## 2022-01-06 DIAGNOSIS — Z992 Dependence on renal dialysis: Secondary | ICD-10-CM | POA: Diagnosis not present

## 2022-01-09 DIAGNOSIS — N2581 Secondary hyperparathyroidism of renal origin: Secondary | ICD-10-CM | POA: Diagnosis not present

## 2022-01-09 DIAGNOSIS — D509 Iron deficiency anemia, unspecified: Secondary | ICD-10-CM | POA: Diagnosis not present

## 2022-01-09 DIAGNOSIS — D631 Anemia in chronic kidney disease: Secondary | ICD-10-CM | POA: Diagnosis not present

## 2022-01-09 DIAGNOSIS — N186 End stage renal disease: Secondary | ICD-10-CM | POA: Diagnosis not present

## 2022-01-09 DIAGNOSIS — N250 Renal osteodystrophy: Secondary | ICD-10-CM | POA: Diagnosis not present

## 2022-01-09 DIAGNOSIS — Z992 Dependence on renal dialysis: Secondary | ICD-10-CM | POA: Diagnosis not present

## 2022-01-11 DIAGNOSIS — N2581 Secondary hyperparathyroidism of renal origin: Secondary | ICD-10-CM | POA: Diagnosis not present

## 2022-01-11 DIAGNOSIS — D631 Anemia in chronic kidney disease: Secondary | ICD-10-CM | POA: Diagnosis not present

## 2022-01-11 DIAGNOSIS — N186 End stage renal disease: Secondary | ICD-10-CM | POA: Diagnosis not present

## 2022-01-11 DIAGNOSIS — Z992 Dependence on renal dialysis: Secondary | ICD-10-CM | POA: Diagnosis not present

## 2022-01-11 DIAGNOSIS — N250 Renal osteodystrophy: Secondary | ICD-10-CM | POA: Diagnosis not present

## 2022-01-11 DIAGNOSIS — D509 Iron deficiency anemia, unspecified: Secondary | ICD-10-CM | POA: Diagnosis not present

## 2022-01-13 DIAGNOSIS — Z992 Dependence on renal dialysis: Secondary | ICD-10-CM | POA: Diagnosis not present

## 2022-01-13 DIAGNOSIS — D509 Iron deficiency anemia, unspecified: Secondary | ICD-10-CM | POA: Diagnosis not present

## 2022-01-13 DIAGNOSIS — D631 Anemia in chronic kidney disease: Secondary | ICD-10-CM | POA: Diagnosis not present

## 2022-01-13 DIAGNOSIS — N2581 Secondary hyperparathyroidism of renal origin: Secondary | ICD-10-CM | POA: Diagnosis not present

## 2022-01-13 DIAGNOSIS — N186 End stage renal disease: Secondary | ICD-10-CM | POA: Diagnosis not present

## 2022-01-13 DIAGNOSIS — N250 Renal osteodystrophy: Secondary | ICD-10-CM | POA: Diagnosis not present

## 2022-01-16 DIAGNOSIS — N186 End stage renal disease: Secondary | ICD-10-CM | POA: Diagnosis not present

## 2022-01-16 DIAGNOSIS — N250 Renal osteodystrophy: Secondary | ICD-10-CM | POA: Diagnosis not present

## 2022-01-16 DIAGNOSIS — N2581 Secondary hyperparathyroidism of renal origin: Secondary | ICD-10-CM | POA: Diagnosis not present

## 2022-01-16 DIAGNOSIS — D631 Anemia in chronic kidney disease: Secondary | ICD-10-CM | POA: Diagnosis not present

## 2022-01-16 DIAGNOSIS — Z992 Dependence on renal dialysis: Secondary | ICD-10-CM | POA: Diagnosis not present

## 2022-01-16 DIAGNOSIS — E119 Type 2 diabetes mellitus without complications: Secondary | ICD-10-CM | POA: Diagnosis not present

## 2022-01-16 DIAGNOSIS — D509 Iron deficiency anemia, unspecified: Secondary | ICD-10-CM | POA: Diagnosis not present

## 2022-01-18 DIAGNOSIS — N2581 Secondary hyperparathyroidism of renal origin: Secondary | ICD-10-CM | POA: Diagnosis not present

## 2022-01-18 DIAGNOSIS — N250 Renal osteodystrophy: Secondary | ICD-10-CM | POA: Diagnosis not present

## 2022-01-18 DIAGNOSIS — D631 Anemia in chronic kidney disease: Secondary | ICD-10-CM | POA: Diagnosis not present

## 2022-01-18 DIAGNOSIS — Z992 Dependence on renal dialysis: Secondary | ICD-10-CM | POA: Diagnosis not present

## 2022-01-18 DIAGNOSIS — N186 End stage renal disease: Secondary | ICD-10-CM | POA: Diagnosis not present

## 2022-01-18 DIAGNOSIS — D509 Iron deficiency anemia, unspecified: Secondary | ICD-10-CM | POA: Diagnosis not present

## 2022-01-20 DIAGNOSIS — D509 Iron deficiency anemia, unspecified: Secondary | ICD-10-CM | POA: Diagnosis not present

## 2022-01-20 DIAGNOSIS — Z992 Dependence on renal dialysis: Secondary | ICD-10-CM | POA: Diagnosis not present

## 2022-01-20 DIAGNOSIS — N2581 Secondary hyperparathyroidism of renal origin: Secondary | ICD-10-CM | POA: Diagnosis not present

## 2022-01-20 DIAGNOSIS — D631 Anemia in chronic kidney disease: Secondary | ICD-10-CM | POA: Diagnosis not present

## 2022-01-20 DIAGNOSIS — N250 Renal osteodystrophy: Secondary | ICD-10-CM | POA: Diagnosis not present

## 2022-01-20 DIAGNOSIS — N186 End stage renal disease: Secondary | ICD-10-CM | POA: Diagnosis not present

## 2022-01-23 DIAGNOSIS — Z992 Dependence on renal dialysis: Secondary | ICD-10-CM | POA: Diagnosis not present

## 2022-01-23 DIAGNOSIS — D509 Iron deficiency anemia, unspecified: Secondary | ICD-10-CM | POA: Diagnosis not present

## 2022-01-23 DIAGNOSIS — N186 End stage renal disease: Secondary | ICD-10-CM | POA: Diagnosis not present

## 2022-01-23 DIAGNOSIS — D631 Anemia in chronic kidney disease: Secondary | ICD-10-CM | POA: Diagnosis not present

## 2022-01-23 DIAGNOSIS — N250 Renal osteodystrophy: Secondary | ICD-10-CM | POA: Diagnosis not present

## 2022-01-23 DIAGNOSIS — N2581 Secondary hyperparathyroidism of renal origin: Secondary | ICD-10-CM | POA: Diagnosis not present

## 2022-01-25 DIAGNOSIS — N186 End stage renal disease: Secondary | ICD-10-CM | POA: Diagnosis not present

## 2022-01-25 DIAGNOSIS — D631 Anemia in chronic kidney disease: Secondary | ICD-10-CM | POA: Diagnosis not present

## 2022-01-25 DIAGNOSIS — Z992 Dependence on renal dialysis: Secondary | ICD-10-CM | POA: Diagnosis not present

## 2022-01-25 DIAGNOSIS — D509 Iron deficiency anemia, unspecified: Secondary | ICD-10-CM | POA: Diagnosis not present

## 2022-01-25 DIAGNOSIS — N250 Renal osteodystrophy: Secondary | ICD-10-CM | POA: Diagnosis not present

## 2022-01-25 DIAGNOSIS — N2581 Secondary hyperparathyroidism of renal origin: Secondary | ICD-10-CM | POA: Diagnosis not present

## 2022-01-26 ENCOUNTER — Telehealth: Payer: Self-pay

## 2022-01-26 NOTE — Patient Outreach (Signed)
[facility]) Care Management  01/26/2022  Timothy Gibson September 09, 1950 [id number]   Outgoing call to the patient for initial outreach for engagement in [place]. [facility] compliant voicemail left for the patient. Will have scheduling care guide reach out and schedule an appointment.   Noreene Larsson RN, MSN, CCM Community Care Coordinator [facility] Mobile: [phone]

## 2022-01-27 DIAGNOSIS — Z992 Dependence on renal dialysis: Secondary | ICD-10-CM | POA: Diagnosis not present

## 2022-01-27 DIAGNOSIS — N2581 Secondary hyperparathyroidism of renal origin: Secondary | ICD-10-CM | POA: Diagnosis not present

## 2022-01-27 DIAGNOSIS — N250 Renal osteodystrophy: Secondary | ICD-10-CM | POA: Diagnosis not present

## 2022-01-27 DIAGNOSIS — N186 End stage renal disease: Secondary | ICD-10-CM | POA: Diagnosis not present

## 2022-01-27 DIAGNOSIS — D631 Anemia in chronic kidney disease: Secondary | ICD-10-CM | POA: Diagnosis not present

## 2022-01-27 DIAGNOSIS — D509 Iron deficiency anemia, unspecified: Secondary | ICD-10-CM | POA: Diagnosis not present

## 2022-01-30 DIAGNOSIS — N2581 Secondary hyperparathyroidism of renal origin: Secondary | ICD-10-CM | POA: Diagnosis not present

## 2022-01-30 DIAGNOSIS — N186 End stage renal disease: Secondary | ICD-10-CM | POA: Diagnosis not present

## 2022-01-30 DIAGNOSIS — N250 Renal osteodystrophy: Secondary | ICD-10-CM | POA: Diagnosis not present

## 2022-01-30 DIAGNOSIS — D509 Iron deficiency anemia, unspecified: Secondary | ICD-10-CM | POA: Diagnosis not present

## 2022-01-30 DIAGNOSIS — D631 Anemia in chronic kidney disease: Secondary | ICD-10-CM | POA: Diagnosis not present

## 2022-01-30 DIAGNOSIS — Z992 Dependence on renal dialysis: Secondary | ICD-10-CM | POA: Diagnosis not present

## 2022-02-01 DIAGNOSIS — D509 Iron deficiency anemia, unspecified: Secondary | ICD-10-CM | POA: Diagnosis not present

## 2022-02-01 DIAGNOSIS — Z992 Dependence on renal dialysis: Secondary | ICD-10-CM | POA: Diagnosis not present

## 2022-02-01 DIAGNOSIS — N2581 Secondary hyperparathyroidism of renal origin: Secondary | ICD-10-CM | POA: Diagnosis not present

## 2022-02-01 DIAGNOSIS — N250 Renal osteodystrophy: Secondary | ICD-10-CM | POA: Diagnosis not present

## 2022-02-01 DIAGNOSIS — D631 Anemia in chronic kidney disease: Secondary | ICD-10-CM | POA: Diagnosis not present

## 2022-02-01 DIAGNOSIS — N186 End stage renal disease: Secondary | ICD-10-CM | POA: Diagnosis not present

## 2022-02-02 ENCOUNTER — Ambulatory Visit (INDEPENDENT_AMBULATORY_CARE_PROVIDER_SITE_OTHER): Payer: Medicare Other | Admitting: *Deleted

## 2022-02-02 DIAGNOSIS — Z0000 Encounter for general adult medical examination without abnormal findings: Secondary | ICD-10-CM | POA: Diagnosis not present

## 2022-02-02 NOTE — Progress Notes (Signed)
Subjective:   Timothy Gibson is a 71 y.o. male who presents for Medicare Annual/Subsequent preventive examination.  I connected with  Timothy Gibson on 02/02/22 by a telephone enabled telemedicine application and verified that I am speaking with the correct person using two identifiers.   I discussed the limitations of evaluation and management by telemedicine. The patient expressed understanding and agreed to proceed.  Patient location: home  Provider location:  Tele-Health-home    Review of Systems     Cardiac Risk Factors include: advanced age (>39mn, >>75women);diabetes mellitus;male gender;smoking/ tobacco exposure;obesity (BMI >30kg/m2);sedentary lifestyle;hypertension     Objective:    Today's Vitals   There is no height or weight on file to calculate BMI.     02/02/2022   12:36 PM 09/17/2019   11:09 AM 06/04/2018    9:37 AM 05/23/2017   12:13 AM 05/16/2017    9:01 AM 03/20/2017   10:09 PM 03/20/2017    5:11 PM  Advanced Directives  Does Patient Have a Medical Advance Directive? No No Yes Yes Yes Yes Yes  Type of AComptrollerLiving will Living will;Healthcare Power of [place]conning[place] Pass[place] will  Does patient want to make changes to medical advance directive?   No - Patient declined   No - Patient declined   Copy of [place]?   No - copy requested No - copy requested No - copy requested No - copy requested No - copy requested  Would patient like information on creating a medical advance directive? No - Patient declined     No - Patient declined No - Patient declined    Current Medications (verified) Outpatient Encounter Medications as of 02/02/2022  Medication Sig   albuterol (VENTOLIN HFA) 108 (90 Base) MCG/ACT inhaler INHALE 2 PUFFS INTO THE LUNGS EVERY 6 HOURS AS NEEDED FOR WHEEZING OR SHORTNESS OF BREATH   ANORO ELLIPTA 62.5-25  MCG/ACT AEPB INHALE 1 PUFF INTO THE LUNGS DAILY   insulin degludec (TRESIBA FLEXTOUCH) 100 UNIT/ML FlexTouch Pen ADMINISTER 10 UNITS UNDER THE SKIN AT BEDTIME   metoprolol succinate (TOPROL-XL) 25 MG 24 hr tablet Take 1 tablet by mouth daily.   sacubitril-valsartan (ENTRESTO) 24-26 MG Take 1 tablet by mouth every 12 (twelve) hours.   sevelamer carbonate (RENVELA) 800 MG tablet Take 2,400-3,200 mg by mouth 3 (three) times daily with meals.   urea (CARMOL) 40 % ointment Apply 1 application. topically daily as needed (Dead skin).   No facility-administered encounter medications on file as of 02/02/2022.    Allergies (verified) Adhesive [tape]   History: Past Medical History:  Diagnosis Date   Cancer ([place]    right kidney   Diabetes mellitus without complication ([place]    Dialysis patient ([place]    on Mon-Wed-Fri dialysis   ESRD (end stage renal disease) ([place]    History of gangrene    Hypertension    Past Surgical History:  Procedure Laterality Date   A/V FISTULAGRAM Left 06/04/2018   Procedure: A/V FISTULAGRAM;  Surgeon: SKatha Cabal MD;  [facility];  Service: Cardiovascular;  Laterality: Left;   COLONOSCOPY WITH PROPOFOL N/A 12/16/2015   Procedure: COLONOSCOPY WITH PROPOFOL;  Surgeon: RManya Silvas MD;  [facility];  Service: Endoscopy;  Laterality: N/A;   KNEE SURGERY     left forearm AV fistula     LOWER EXTREMITY ANGIOGRAPHY Left 07/24/2019  Procedure: LOWER EXTREMITY ANGIOGRAPHY;  Surgeon: Algernon Huxley, MD;  Location: [facility];  Service: Cardiovascular;  Laterality: Left;   NEPHRECTOMY     PERIPHERAL VASCULAR THROMBECTOMY Left 09/01/2020   Procedure: PERIPHERAL VASCULAR THROMBECTOMY;  Surgeon: Katha Cabal, MD;  Location: [facility];  Service: Cardiovascular;  Laterality: Left;   Family History  Problem Relation Age of Onset   Hypertension Father    Colon cancer Father    Hypertension Mother     Parkinson's disease Mother    Diabetes Mother    Alzheimer's disease Mother    Social History   Socioeconomic History   Marital status: Divorced    Spouse name: Not on file   Number of children: 4   Years of education: Not on file   Highest education level: Not on file  Occupational History   Occupation: retired    Comment: forensics  Tobacco Use   Smoking status: Some Days    Types: Cigars   Smokeless tobacco: Former   Tobacco comments:    cigar smoker-hasnt smoked in 5 mos.   Vaping Use   Vaping Use: Never used  Substance and Sexual Activity   Alcohol use: Not Currently   Drug use: Never   Sexual activity: Not Currently  Other Topics Concern   Not on file  Social History Narrative   Independent, lives with Ms. Jennefer Bravo and her spouse at home.   Social Determinants of Health   Financial Resource Strain: Low Risk  (02/02/2022)   Overall Financial Resource Strain (CARDIA)    Difficulty of Paying Living Expenses: Not hard at all  Food Insecurity: No Food Insecurity (02/02/2022)   Hunger Vital Sign    Worried About Running Out of Food in the Last Year: Never true    Ran Out of Food in the Last Year: Never true  Transportation Needs: No Transportation Needs (02/02/2022)   PRAPARE - Hydrologist (Medical): No    Lack of Transportation (Non-Medical): No  Physical Activity: Inactive (02/02/2022)   Exercise Vital Sign    Days of Exercise per Week: 0 days    Minutes of Exercise per Session: 0 min  Stress: No Stress Concern Present (02/02/2022)   [place]    Feeling of Stress : Not at all  Social Connections: Socially Isolated (02/02/2022)   Social Connection and Isolation Panel [NHANES]    Frequency of Communication with Friends and Family: Never    Frequency of Social Gatherings with Friends and Family: Never    Attends Religious Services: Never    Marine scientist or  Organizations: No    Attends Music therapist: Never    Marital Status: Divorced    Tobacco Counseling Ready to quit: Not Answered Counseling given: Not Answered Tobacco comments: cigar smoker-hasnt smoked in 5 mos.    Clinical Intake:  Pre-visit preparation completed: Yes  Pain : No/denies pain     Nutritional Risks: None Diabetes: Yes CBG done?: No Did pt. bring in CBG monitor from home?: No  How often do you need to have someone help you when you read instructions, pamphlets, or other written materials from your doctor or pharmacy?: 4 - Often  Diabetic yes  Nutrition Risk Assessment:  Has the patient had any N/V/D within the last 2 months?  No  Does the patient have any non-healing wounds?  No  Has the patient had any  unintentional weight loss or weight gain?  No   Diabetes:  Is the patient diabetic?  Yes  If diabetic, was a CBG obtained today?  No  Did the patient bring in their glucometer from home?  No  How often do you monitor your CBG's? Does not check.   Financial Strains and Diabetes Management:  Are you having any financial strains with the device, your supplies or your medication? No .  Does the patient want to be seen by Chronic Care Management for management of their diabetes?  No  Would the patient like to be referred to a Nutritionist or for Diabetic Management?  No   Diabetic Exams:  Diabetic Eye Exam:.  Pt has been advised about the importance in completing this exam. A referral has been placed today.  Diabetic Foot Exam: Pt has been advised about the importance in completing this exam.  Interpreter Needed?: No  Information entered by :: Leroy Kennedy LPN   Activities of Daily Living    02/02/2022   12:42 PM 04/28/2021   11:14 AM  In your present state of health, do you have any difficulty performing the following activities:  Hearing? 0 1  Vision? 0 1  Difficulty concentrating or making decisions? 0 1  Walking or climbing  stairs? 0 1  Dressing or bathing? 0 0  Doing errands, shopping? 0 0  Preparing Food and eating ? N   Using the Toilet? N   In the past six months, have you accidently leaked urine? Y   Do you have problems with loss of bowel control? N   Managing your Medications? N   Managing your Finances? N   Housekeeping or managing your Housekeeping? Y     Patient Care Team: Jon Billings, NP as PCP - General  Indicate any recent Medical Services you may have received from other than [facility] providers in the past year (date may be approximate).     Assessment:   This is a routine wellness examination for Edrik.  Hearing/Vision screen Hearing Screening - Comments:: No trouble hearing Vision Screening - Comments:: Not up to date  Dietary issues and exercise activities discussed: Current Exercise Habits: The patient does not participate in regular exercise at present   Goals Addressed             This Visit's Progress    Patient Stated       No goals       Depression Screen    02/02/2022   12:42 PM 11/10/2021   10:02 AM 04/28/2021   11:13 AM 09/17/2019   11:13 AM 08/15/2018   11:34 AM  PHQ 2/9 Scores  PHQ - 2 Score 0   0 2  PHQ- 9 Score     9  Exception Documentation  Patient refusal Patient refusal      Fall Risk    02/02/2022   12:36 PM 04/28/2021   11:13 AM 05/21/2020    3:03 PM 11/19/2018   10:30 AM 08/15/2018   11:34 AM  Fall Risk   Falls in the past year? 1  0 1 0  Number falls in past yr: 0 0  0 0  Injury with Fall? 0 0  0 0  Risk for fall due to :  Impaired balance/gait;Impaired mobility  History of fall(s)   Follow up Falls evaluation completed;Education provided;Falls prevention discussed Falls evaluation completed  Falls evaluation completed     FALL RISK PREVENTION PERTAINING TO THE HOME:  Any stairs in  or around the home? Yes  If so, are there any without handrails? No  Home free of loose throw rugs in walkways, pet beds, electrical cords, etc? Yes   Adequate lighting in your home to reduce risk of falls? Yes   ASSISTIVE DEVICES UTILIZED TO PREVENT FALLS:  Life alert? No  Use of a cane, walker or w/c? Yes  Grab bars in the bathroom? No  Shower chair or bench in shower? No  Elevated toilet seat or a handicapped toilet? No   TIMED UP AND GO:  Was the test performed? No .    Cognitive Function:        Immunizations Immunization History  Administered Date(s) Administered   Influenza, High Dose Seasonal PF 05/12/2020, 05/06/2021   Influenza, Seasonal, Injecte, Preservative Fre 03/28/2019   Influenza-Unspecified 03/15/2016, 04/02/2017, 04/01/2018, 03/28/2019   Moderna Sars-Covid-2 Vaccination 08/22/2019, 09/19/2019, 07/13/2020   PPD Test 09/04/2011, 01/01/2012, 08/14/2012, 07/23/2014, 04/12/2015, 08/02/2016, 07/16/2017, 07/17/2018   Pneumococcal Conjugate-13 06/16/2019   Pneumococcal Polysaccharide-23 05/09/2010, 09/02/2012, 07/18/2017   Pneumococcal-Unspecified 05/09/2010, 09/02/2012, 07/18/2017    TDAP status: Due, Education has been provided regarding the importance of this vaccine. Advised may receive this vaccine at local pharmacy or Health Dept. Aware to provide a copy of the vaccination record if obtained from local pharmacy or Health Dept. Verbalized acceptance and understanding.  Flu Vaccine status: Up to date  Pneumococcal vaccine status: Up to date  Covid-19 vaccine status: Information provided on how to obtain vaccines.   Qualifies for Shingles Vaccine? Yes   Zostavax completed No   Shingrix Completed?: No.    Education has been provided regarding the importance of this vaccine. Patient has been advised to call insurance company to determine out of pocket expense if they have not yet received this vaccine. Advised may also receive vaccine at local pharmacy or Health Dept. Verbalized acceptance and understanding.  Screening Tests Health Maintenance  Topic Date Due   Hepatitis C Screening  Never done    OPHTHALMOLOGY EXAM  11/24/2020   COVID-19 Vaccine (4 - Moderna series) 02/18/2022 (Originally 09/07/2020)   Zoster Vaccines- Shingrix (1 of 2) 05/05/2022 (Originally 12/08/2000)   TETANUS/TDAP  02/03/2023 (Originally [birth year]/1969)   INFLUENZA VACCINE  02/07/2022   FOOT EXAM  05/12/2022   HEMOGLOBIN A1C  05/13/2022   COLONOSCOPY (Pts 45-63yr Insurance coverage will need to be confirmed)  12/15/2025   Pneumonia Vaccine 71 Years old  Completed   HPV VACCINES  Aged Out    Health Maintenance  Health Maintenance Due  Topic Date Due   Hepatitis C Screening  Never done   OPHTHALMOLOGY EXAM  11/24/2020    Colorectal cancer screening: Type of screening: Colonoscopy. Completed 2016. Repeat every 10 years  Lung Cancer Screening: (Low Dose CT Chest recommended if Age 71-80years, 30 pack-year currently smoking OR have quit w/in 15years.) does not qualify.   Lung Cancer Screening Referral:   Additional Screening:  Hepatitis C Screening: does qualify  Vision Screening: Recommended annual ophthalmology exams for early detection of glaucoma and other disorders of the eye. Is the patient up to date with their annual eye exam?  No  Who is the provider or what is the name of the office in which the patient attends annual eye exams? If pt is not established with a provider, would they like to be referred to a provider to establish care?  Patient states WEllin Mayhewdismissed.       [facility]ave [facility] for [facility].   Dental Screening: Recommended  annual dental exams for proper oral hygiene  Community Resource Referral / Chronic Care Management: CRR required this visit?  No   CCM required this visit?  No      Plan:     I have personally reviewed and noted the following in the patient's chart:   Medical and social history Use of alcohol, tobacco or illicit drugs  Current medications and supplements including opioid prescriptions. Patient is not currently taking opioid prescriptions. Functional  ability and status Nutritional status Physical activity Advanced directives List of other physicians Hospitalizations, surgeries, and ER visits in previous 12 months Vitals Screenings to include cognitive, depression, and falls Referrals and appointments  In addition, I have reviewed and discussed with patient certain preventive protocols, quality metrics, and best practice recommendations. A written personalized care plan for preventive services as well as general preventive health recommendations were provided to patient.     Leroy Kennedy, LPN   03/25/9449   Nurse Notes:

## 2022-02-02 NOTE — Patient Instructions (Signed)
Timothy Gibson , Thank you for taking time to come for your Medicare Wellness Visit. I appreciate your ongoing commitment to your health goals. Please review the following plan we discussed and let me know if I can assist you in the future.   Screening recommendations/referrals: Colonoscopy: up to date Recommended yearly ophthalmology/optometry visit for glaucoma screening and checkup Recommended yearly dental visit for hygiene and checkup  Vaccinations: Influenza vaccine: up to date Pneumococcal vaccine: up to date Tdap vaccine: Education provided Shingles vaccine: Education provided    Advanced directives: Education provided  Conditions/risks identified:   Next appointment: 02-14-2022 @ 9:40  [facility]  Preventive Care 71 Years and Older, Male Preventive care refers to lifestyle choices and visits with your health care provider that can promote health and wellness. What does preventive care include? A yearly physical exam. This is also called an annual well check. Dental exams once or twice a year. Routine eye exams. Ask your health care provider how often you should have your eyes checked. Personal lifestyle choices, including: Daily care of your teeth and gums. Regular physical activity. Eating a healthy diet. Avoiding tobacco and drug use. Limiting alcohol use. Practicing safe sex. Taking low doses of aspirin every day. Taking vitamin and mineral supplements as recommended by your health care provider. What happens during an annual well check? The services and screenings done by your health care provider during your annual well check will depend on your age, overall health, lifestyle risk factors, and family history of disease. Counseling  Your health care provider may ask you questions about your: Alcohol use. Tobacco use. Drug use. Emotional well-being. Home and relationship well-being. Sexual activity. Eating habits. History of falls. Memory and ability to  understand (cognition). Work and work Statistician. Screening  You may have the following tests or measurements: Height, weight, and BMI. Blood pressure. Lipid and cholesterol levels. These may be checked every 5 years, or more frequently if you are over 71 years old. Skin check. Lung cancer screening. You may have this screening every year starting at age 71 if you have a 30-pack-year history of smoking and currently smoke or have quit within the past 15 years. Fecal occult blood test (FOBT) of the stool. You may have this test every year starting at age 71. Flexible sigmoidoscopy or colonoscopy. You may have a sigmoidoscopy every 5 years or a colonoscopy every 10 years starting at age 71. Prostate cancer screening. Recommendations will vary depending on your family history and other risks. Hepatitis C blood test. Hepatitis B blood test. Sexually transmitted disease (STD) testing. Diabetes screening. This is done by checking your blood sugar (glucose) after you have not eaten for a while (fasting). You may have this done every 1-3 years. Abdominal aortic aneurysm (AAA) screening. You may need this if you are a current or former smoker. Osteoporosis. You may be screened starting at age 71 if you are at high risk. Talk with your health care provider about your test results, treatment options, and if necessary, the need for more tests. Vaccines  Your health care provider may recommend certain vaccines, such as: Influenza vaccine. This is recommended every year. Tetanus, diphtheria, and acellular pertussis (Tdap, Td) vaccine. You may need a Td booster every 10 years. Zoster vaccine. You may need this after age 71. Pneumococcal 13-valent conjugate (PCV13) vaccine. One dose is recommended after age 71. Pneumococcal polysaccharide (PPSV23) vaccine. One dose is recommended after age 71. Talk to your health care provider about which screenings and vaccines  you need and how often you need them. This  information is not intended to replace advice given to you by your health care provider. Make sure you discuss any questions you have with your health care provider. Document Released: 07/23/2015 Document Revised: 03/15/2016 Document Reviewed: 04/27/2015 Elsevier Interactive Patient Education  2017 [place] Prevention in the Home Falls can cause injuries. They can happen to people of all ages. There are many things you can do to make your home safe and to help prevent falls. What can I do on the outside of my home? Regularly fix the edges of walkways and driveways and fix any cracks. Remove anything that might make you trip as you walk through a door, such as a raised step or threshold. Trim any bushes or trees on the path to your home. Use bright outdoor lighting. Clear any walking paths of anything that might make someone trip, such as rocks or tools. Regularly check to see if handrails are loose or broken. Make sure that both sides of any steps have handrails. Any raised decks and porches should have guardrails on the edges. Have any leaves, snow, or ice cleared regularly. Use sand or salt on walking paths during winter. Clean up any spills in your garage right away. This includes oil or grease spills. What can I do in the bathroom? Use night lights. Install grab bars by the toilet and in the tub and shower. Do not use towel bars as grab bars. Use non-skid mats or decals in the tub or shower. If you need to sit down in the shower, use a plastic, non-slip stool. Keep the floor dry. Clean up any water that spills on the floor as soon as it happens. Remove soap buildup in the tub or shower regularly. Attach bath mats securely with double-sided non-slip rug tape. Do not have throw rugs and other things on the floor that can make you trip. What can I do in the bedroom? Use night lights. Make sure that you have a light by your bed that is easy to reach. Do not use any sheets or  blankets that are too big for your bed. They should not hang down onto the floor. Have a firm chair that has side arms. You can use this for support while you get dressed. Do not have throw rugs and other things on the floor that can make you trip. What can I do in the kitchen? Clean up any spills right away. Avoid walking on wet floors. Keep items that you use a lot in easy-to-reach places. If you need to reach something above you, use a strong step stool that has a grab bar. Keep electrical cords out of the way. Do not use floor polish or wax that makes floors slippery. If you must use wax, use non-skid floor wax. Do not have throw rugs and other things on the floor that can make you trip. What can I do with my stairs? Do not leave any items on the stairs. Make sure that there are handrails on both sides of the stairs and use them. Fix handrails that are broken or loose. Make sure that handrails are as long as the stairways. Check any carpeting to make sure that it is firmly attached to the stairs. Fix any carpet that is loose or worn. Avoid having throw rugs at the top or bottom of the stairs. If you do have throw rugs, attach them to the floor with carpet tape. Make sure  that you have a light switch at the top of the stairs and the bottom of the stairs. If you do not have them, ask someone to add them for you. What else can I do to help prevent falls? Wear shoes that: Do not have high heels. Have rubber bottoms. Are comfortable and fit you well. Are closed at the toe. Do not wear sandals. If you use a stepladder: Make sure that it is fully opened. Do not climb a closed stepladder. Make sure that both sides of the stepladder are locked into place. Ask someone to hold it for you, if possible. Clearly mark and make sure that you can see: Any grab bars or handrails. First and last steps. Where the edge of each step is. Use tools that help you move around (mobility aids) if they are  needed. These include: Canes. Walkers. Scooters. Crutches. Turn on the lights when you go into a dark area. Replace any light bulbs as soon as they burn out. Set up your furniture so you have a clear path. Avoid moving your furniture around. If any of your floors are uneven, fix them. If there are any pets around you, be aware of where they are. Review your medicines with your doctor. Some medicines can make you feel dizzy. This can increase your chance of falling. Ask your doctor what other things that you can do to help prevent falls. This information is not intended to replace advice given to you by your health care provider. Make sure you discuss any questions you have with your health care provider. Document Released: 04/22/2009 Document Revised: 12/02/2015 Document Reviewed: 07/31/2014 Elsevier Interactive Patient Education  2017 [employer].

## 2022-02-03 DIAGNOSIS — D631 Anemia in chronic kidney disease: Secondary | ICD-10-CM | POA: Diagnosis not present

## 2022-02-03 DIAGNOSIS — N2581 Secondary hyperparathyroidism of renal origin: Secondary | ICD-10-CM | POA: Diagnosis not present

## 2022-02-03 DIAGNOSIS — N186 End stage renal disease: Secondary | ICD-10-CM | POA: Diagnosis not present

## 2022-02-03 DIAGNOSIS — N250 Renal osteodystrophy: Secondary | ICD-10-CM | POA: Diagnosis not present

## 2022-02-03 DIAGNOSIS — Z992 Dependence on renal dialysis: Secondary | ICD-10-CM | POA: Diagnosis not present

## 2022-02-03 DIAGNOSIS — D509 Iron deficiency anemia, unspecified: Secondary | ICD-10-CM | POA: Diagnosis not present

## 2022-02-06 DIAGNOSIS — N2581 Secondary hyperparathyroidism of renal origin: Secondary | ICD-10-CM | POA: Diagnosis not present

## 2022-02-06 DIAGNOSIS — Z992 Dependence on renal dialysis: Secondary | ICD-10-CM | POA: Diagnosis not present

## 2022-02-06 DIAGNOSIS — D509 Iron deficiency anemia, unspecified: Secondary | ICD-10-CM | POA: Diagnosis not present

## 2022-02-06 DIAGNOSIS — D631 Anemia in chronic kidney disease: Secondary | ICD-10-CM | POA: Diagnosis not present

## 2022-02-06 DIAGNOSIS — N186 End stage renal disease: Secondary | ICD-10-CM | POA: Diagnosis not present

## 2022-02-06 DIAGNOSIS — N250 Renal osteodystrophy: Secondary | ICD-10-CM | POA: Diagnosis not present

## 2022-02-08 DIAGNOSIS — N2581 Secondary hyperparathyroidism of renal origin: Secondary | ICD-10-CM | POA: Diagnosis not present

## 2022-02-08 DIAGNOSIS — N186 End stage renal disease: Secondary | ICD-10-CM | POA: Diagnosis not present

## 2022-02-08 DIAGNOSIS — Z992 Dependence on renal dialysis: Secondary | ICD-10-CM | POA: Diagnosis not present

## 2022-02-08 DIAGNOSIS — D509 Iron deficiency anemia, unspecified: Secondary | ICD-10-CM | POA: Diagnosis not present

## 2022-02-08 DIAGNOSIS — D631 Anemia in chronic kidney disease: Secondary | ICD-10-CM | POA: Diagnosis not present

## 2022-02-08 DIAGNOSIS — N250 Renal osteodystrophy: Secondary | ICD-10-CM | POA: Diagnosis not present

## 2022-02-10 DIAGNOSIS — D509 Iron deficiency anemia, unspecified: Secondary | ICD-10-CM | POA: Diagnosis not present

## 2022-02-10 DIAGNOSIS — N186 End stage renal disease: Secondary | ICD-10-CM | POA: Diagnosis not present

## 2022-02-10 DIAGNOSIS — Z992 Dependence on renal dialysis: Secondary | ICD-10-CM | POA: Diagnosis not present

## 2022-02-10 DIAGNOSIS — N250 Renal osteodystrophy: Secondary | ICD-10-CM | POA: Diagnosis not present

## 2022-02-10 DIAGNOSIS — N2581 Secondary hyperparathyroidism of renal origin: Secondary | ICD-10-CM | POA: Diagnosis not present

## 2022-02-10 DIAGNOSIS — D631 Anemia in chronic kidney disease: Secondary | ICD-10-CM | POA: Diagnosis not present

## 2022-02-13 DIAGNOSIS — N186 End stage renal disease: Secondary | ICD-10-CM | POA: Diagnosis not present

## 2022-02-13 DIAGNOSIS — D631 Anemia in chronic kidney disease: Secondary | ICD-10-CM | POA: Diagnosis not present

## 2022-02-13 DIAGNOSIS — D509 Iron deficiency anemia, unspecified: Secondary | ICD-10-CM | POA: Diagnosis not present

## 2022-02-13 DIAGNOSIS — Z992 Dependence on renal dialysis: Secondary | ICD-10-CM | POA: Diagnosis not present

## 2022-02-13 DIAGNOSIS — N2581 Secondary hyperparathyroidism of renal origin: Secondary | ICD-10-CM | POA: Diagnosis not present

## 2022-02-13 DIAGNOSIS — N250 Renal osteodystrophy: Secondary | ICD-10-CM | POA: Diagnosis not present

## 2022-02-13 NOTE — Progress Notes (Unsigned)
There were no vitals taken for this visit.   Subjective:    Patient ID: Timothy Gibson, male    DOB: 30-Aug-1950, 71 y.o.   MRN: [id number]  HPI: Timothy Gibson is a 71 y.o. male  No chief complaint on file.  HYPERTENSION Hypertension status: uncontrolled  Satisfied with current treatment? yes Duration of hypertension: years BP monitoring frequency:   checked frequently at dialysis BP range:  BP medication side effects:  no Medication compliance: excellent compliance Previous BP meds: Metoprolol and valsartan Aspirin: no Recurrent headaches: no Visual changes: no Palpitations: no Dyspnea: no Chest pain: no Lower extremity edema: no Dizzy/lightheaded: no  DIABETES Hypoglycemic episodes:no Polydipsia/polyuria: no Visual disturbance: no Chest pain: no Paresthesias: no Glucose Monitoring: no  Accucheck frequency: Not Checking  Fasting glucose:  Post prandial:  Evening:  Before meals: Taking Insulin?: yes  Long acting insulin: Tresiba 10u  Short acting insulin: Blood Pressure Monitoring:  checked at dialysis Retinal Examination: Not up to Date Foot Exam: Not up to Date Diabetic Education: Not Completed Pneumovax: Up to Date Influenza: Up to Date Aspirin: no  DIALYSIS M,W,F.  Patient states he saw Dr. Holley Raring yesterday.  He was pleased with his A1c.  Does not urinate at all.    Relevant past medical, surgical, family and social history reviewed and updated as indicated. Interim medical history since our last visit reviewed. Allergies and medications reviewed and updated.  Review of Systems  Eyes:  Negative for visual disturbance.  Respiratory:  Negative for chest tightness and shortness of breath.   Cardiovascular:  Negative for chest pain, palpitations and leg swelling.  Endocrine: Negative for polydipsia and polyuria.  Neurological:  Negative for dizziness, light-headedness, numbness and headaches.   Per HPI unless specifically indicated above      Objective:    There were no vitals taken for this visit.  Wt Readings from Last 3 Encounters:  12/02/21 247 lb 3.2 oz (112.1 kg)  11/10/21 239 lb 9.6 oz (108.7 kg)  05/12/21 242 lb 3.2 oz (109.9 kg)    Physical Exam Vitals and nursing note reviewed.  Constitutional:      General: He is not in acute distress.    Appearance: Normal appearance. He is not ill-appearing, toxic-appearing or diaphoretic.  HENT:     Head: Normocephalic.     Right Ear: External ear normal.     Left Ear: External ear normal.     Nose: Nose normal. No congestion or rhinorrhea.     Mouth/Throat:     Mouth: Mucous membranes are moist.  Eyes:     General:        Right eye: No discharge.        Left eye: No discharge.     Extraocular Movements: Extraocular movements intact.     Conjunctiva/sclera: Conjunctivae normal.     Pupils: Pupils are equal, round, and reactive to light.  Cardiovascular:     Rate and Rhythm: Normal rate and regular rhythm.     Heart sounds: No murmur heard. Pulmonary:     Effort: Pulmonary effort is normal. No respiratory distress.     Breath sounds: Normal breath sounds. No wheezing, rhonchi or rales.  Abdominal:     General: Abdomen is flat. Bowel sounds are normal.  Musculoskeletal:     Cervical back: Normal range of motion and neck supple.     Comments: Uses cane  Skin:    General: Skin is warm and dry.     Capillary  Refill: Capillary refill takes less than 2 seconds.  Neurological:     General: No focal deficit present.     Mental Status: He is alert and oriented to person, place, and time.  Psychiatric:        Mood and Affect: Mood normal.        Behavior: Behavior normal.        Thought Content: Thought content normal.        Judgment: Judgment normal.    Results for orders placed or performed in visit on 11/17/21  CBC w/Diff  Result Value Ref Range   WBC 3.4 3.4 - 10.8 x10E3/uL   RBC 3.74 (L) 4.14 - 5.80 x10E6/uL   Hemoglobin 12.1 (L) 13.0 - 17.7 g/dL    Hematocrit 36.2 (L) 37.5 - 51.0 %   MCV 97 79 - 97 fL   MCH 32.4 26.6 - 33.0 pg   MCHC 33.4 31.5 - 35.7 g/dL   RDW 13.9 11.6 - 15.4 %   Platelets 101 (L) 150 - 450 x10E3/uL   Neutrophils 53 Not Estab. %   Lymphs 27 Not Estab. %   Monocytes 11 Not Estab. %   Eos 8 Not Estab. %   Basos 1 Not Estab. %   Neutrophils Absolute 1.8 1.4 - 7.0 x10E3/uL   Lymphocytes Absolute 0.9 0.7 - 3.1 x10E3/uL   Monocytes Absolute 0.4 0.1 - 0.9 x10E3/uL   EOS (ABSOLUTE) 0.3 0.0 - 0.4 x10E3/uL   Basophils Absolute 0.0 0.0 - 0.2 x10E3/uL   Immature Granulocytes 0 Not Estab. %   Immature Grans (Abs) 0.0 0.0 - 0.1 x10E3/uL      Assessment & Plan:   Problem List Items Addressed This Visit      Cardiovascular and Mediastinum   Atherosclerosis of native arteries of extremity with intermittent claudication (HCC)   Hypertension associated with diabetes ([place]) - Primary   Chronic diastolic heart failure (HCC)   Atherosclerosis of native arteries of the extremities with ulceration (HCC)     Respiratory   COPD (chronic obstructive pulmonary disease) (HCC)     Endocrine   Type 2 diabetes mellitus with hyperglycemia, without long-term current use of insulin ([place])     Other   Dialysis patient ([place])     Follow up plan: No follow-ups on file.   A total of 30 minutes were spent on this encounter today.  When total time is documented, this includes both the face-to-face and non-face-to-face time personally spent before, during and after the visit on the date of the encounter reviewing lab results from previous visit, discussing blood pressure elevation and performing foot exam.

## 2022-02-14 ENCOUNTER — Encounter: Payer: Self-pay | Admitting: Nurse Practitioner

## 2022-02-14 ENCOUNTER — Ambulatory Visit (INDEPENDENT_AMBULATORY_CARE_PROVIDER_SITE_OTHER): Payer: Medicare Other | Admitting: Nurse Practitioner

## 2022-02-14 VITALS — BP 147/74 | HR 80 | Temp 97.6°F | Wt 247.1 lb

## 2022-02-14 DIAGNOSIS — E1159 Type 2 diabetes mellitus with other circulatory complications: Secondary | ICD-10-CM | POA: Diagnosis not present

## 2022-02-14 DIAGNOSIS — J449 Chronic obstructive pulmonary disease, unspecified: Secondary | ICD-10-CM

## 2022-02-14 DIAGNOSIS — I7025 Atherosclerosis of native arteries of other extremities with ulceration: Secondary | ICD-10-CM | POA: Diagnosis not present

## 2022-02-14 DIAGNOSIS — Z992 Dependence on renal dialysis: Secondary | ICD-10-CM | POA: Diagnosis not present

## 2022-02-14 DIAGNOSIS — R21 Rash and other nonspecific skin eruption: Secondary | ICD-10-CM | POA: Diagnosis not present

## 2022-02-14 DIAGNOSIS — I152 Hypertension secondary to endocrine disorders: Secondary | ICD-10-CM | POA: Diagnosis not present

## 2022-02-14 DIAGNOSIS — I70213 Atherosclerosis of native arteries of extremities with intermittent claudication, bilateral legs: Secondary | ICD-10-CM | POA: Diagnosis not present

## 2022-02-14 DIAGNOSIS — I5032 Chronic diastolic (congestive) heart failure: Secondary | ICD-10-CM

## 2022-02-14 DIAGNOSIS — E1165 Type 2 diabetes mellitus with hyperglycemia: Secondary | ICD-10-CM

## 2022-02-14 MED ORDER — CLOTRIMAZOLE-BETAMETHASONE 1-0.05 % EX CREA: 1.0000 | TOPICAL_CREAM | Freq: Every day | CUTANEOUS | 1 refills | Status: DC

## 2022-02-14 MED ORDER — ALBUTEROL SULFATE HFA 108 (90 BASE) MCG/ACT IN AERS: 2.0000 | INHALATION_SPRAY | Freq: Four times a day (QID) | RESPIRATORY_TRACT | 1 refills | Status: DC | PRN

## 2022-02-14 NOTE — Assessment & Plan Note (Signed)
Stable. Continue to work with nephrology. Continue dialysis. Call with any concerns.  

## 2022-02-14 NOTE — Assessment & Plan Note (Signed)
Chronic.  Followed by Cardiology at [facility]. On Entresto.  Euvolemic today. Continue current regimen. Continue to monitor. Call with any concerns.  

## 2022-02-14 NOTE — Assessment & Plan Note (Signed)
Chronic. Not well controlled.  Patient is on dialysis.  Blood pressure is managed by Nephrology and Cardiology.  States his blood pressure is very low when he leaves dialysis.  Continue per their recommendations.  Asymptomatic at visit today.

## 2022-02-14 NOTE — Assessment & Plan Note (Signed)
Under good control on current regimen. Continue current regimen of Anoro. Refilled Albuterol.  Continue to monitor. Call with any concerns. Refills given.

## 2022-02-14 NOTE — Assessment & Plan Note (Signed)
Chronic.  Controlled.  Last A1c 7.0.  Managed by Nephrology.  On Tresiba 10u nightly.  Continue with current medication regimen.  Labs ordered today.  Return to clinic in 3 months for reevaluation.  Call sooner if concerns arise.

## 2022-02-14 NOTE — Assessment & Plan Note (Signed)
Reviewed labs with patient during visit. Continue with current medication regimen.  Follow up in 6 months.

## 2022-02-15 DIAGNOSIS — D509 Iron deficiency anemia, unspecified: Secondary | ICD-10-CM | POA: Diagnosis not present

## 2022-02-15 DIAGNOSIS — D631 Anemia in chronic kidney disease: Secondary | ICD-10-CM | POA: Diagnosis not present

## 2022-02-15 DIAGNOSIS — N2581 Secondary hyperparathyroidism of renal origin: Secondary | ICD-10-CM | POA: Diagnosis not present

## 2022-02-15 DIAGNOSIS — N250 Renal osteodystrophy: Secondary | ICD-10-CM | POA: Diagnosis not present

## 2022-02-15 DIAGNOSIS — Z992 Dependence on renal dialysis: Secondary | ICD-10-CM | POA: Diagnosis not present

## 2022-02-15 DIAGNOSIS — N186 End stage renal disease: Secondary | ICD-10-CM | POA: Diagnosis not present

## 2022-02-17 DIAGNOSIS — N186 End stage renal disease: Secondary | ICD-10-CM | POA: Diagnosis not present

## 2022-02-17 DIAGNOSIS — D631 Anemia in chronic kidney disease: Secondary | ICD-10-CM | POA: Diagnosis not present

## 2022-02-17 DIAGNOSIS — D509 Iron deficiency anemia, unspecified: Secondary | ICD-10-CM | POA: Diagnosis not present

## 2022-02-17 DIAGNOSIS — N250 Renal osteodystrophy: Secondary | ICD-10-CM | POA: Diagnosis not present

## 2022-02-17 DIAGNOSIS — N2581 Secondary hyperparathyroidism of renal origin: Secondary | ICD-10-CM | POA: Diagnosis not present

## 2022-02-17 DIAGNOSIS — Z992 Dependence on renal dialysis: Secondary | ICD-10-CM | POA: Diagnosis not present

## 2022-02-20 DIAGNOSIS — D509 Iron deficiency anemia, unspecified: Secondary | ICD-10-CM | POA: Diagnosis not present

## 2022-02-20 DIAGNOSIS — D631 Anemia in chronic kidney disease: Secondary | ICD-10-CM | POA: Diagnosis not present

## 2022-02-20 DIAGNOSIS — N2581 Secondary hyperparathyroidism of renal origin: Secondary | ICD-10-CM | POA: Diagnosis not present

## 2022-02-20 DIAGNOSIS — N186 End stage renal disease: Secondary | ICD-10-CM | POA: Diagnosis not present

## 2022-02-20 DIAGNOSIS — N250 Renal osteodystrophy: Secondary | ICD-10-CM | POA: Diagnosis not present

## 2022-02-20 DIAGNOSIS — Z992 Dependence on renal dialysis: Secondary | ICD-10-CM | POA: Diagnosis not present

## 2022-02-22 DIAGNOSIS — D509 Iron deficiency anemia, unspecified: Secondary | ICD-10-CM | POA: Diagnosis not present

## 2022-02-22 DIAGNOSIS — N186 End stage renal disease: Secondary | ICD-10-CM | POA: Diagnosis not present

## 2022-02-22 DIAGNOSIS — Z992 Dependence on renal dialysis: Secondary | ICD-10-CM | POA: Diagnosis not present

## 2022-02-22 DIAGNOSIS — N250 Renal osteodystrophy: Secondary | ICD-10-CM | POA: Diagnosis not present

## 2022-02-22 DIAGNOSIS — N2581 Secondary hyperparathyroidism of renal origin: Secondary | ICD-10-CM | POA: Diagnosis not present

## 2022-02-22 DIAGNOSIS — D631 Anemia in chronic kidney disease: Secondary | ICD-10-CM | POA: Diagnosis not present

## 2022-02-24 DIAGNOSIS — D631 Anemia in chronic kidney disease: Secondary | ICD-10-CM | POA: Diagnosis not present

## 2022-02-24 DIAGNOSIS — N2581 Secondary hyperparathyroidism of renal origin: Secondary | ICD-10-CM | POA: Diagnosis not present

## 2022-02-24 DIAGNOSIS — Z992 Dependence on renal dialysis: Secondary | ICD-10-CM | POA: Diagnosis not present

## 2022-02-24 DIAGNOSIS — N250 Renal osteodystrophy: Secondary | ICD-10-CM | POA: Diagnosis not present

## 2022-02-24 DIAGNOSIS — D509 Iron deficiency anemia, unspecified: Secondary | ICD-10-CM | POA: Diagnosis not present

## 2022-02-24 DIAGNOSIS — N186 End stage renal disease: Secondary | ICD-10-CM | POA: Diagnosis not present

## 2022-02-27 ENCOUNTER — Other Ambulatory Visit: Payer: Self-pay | Admitting: Nurse Practitioner

## 2022-02-27 DIAGNOSIS — N186 End stage renal disease: Secondary | ICD-10-CM | POA: Diagnosis not present

## 2022-02-27 DIAGNOSIS — N2581 Secondary hyperparathyroidism of renal origin: Secondary | ICD-10-CM | POA: Diagnosis not present

## 2022-02-27 DIAGNOSIS — D509 Iron deficiency anemia, unspecified: Secondary | ICD-10-CM | POA: Diagnosis not present

## 2022-02-27 DIAGNOSIS — N250 Renal osteodystrophy: Secondary | ICD-10-CM | POA: Diagnosis not present

## 2022-02-27 DIAGNOSIS — D631 Anemia in chronic kidney disease: Secondary | ICD-10-CM | POA: Diagnosis not present

## 2022-02-27 DIAGNOSIS — Z992 Dependence on renal dialysis: Secondary | ICD-10-CM | POA: Diagnosis not present

## 2022-02-28 NOTE — Telephone Encounter (Signed)
Requested Prescriptions  Pending Prescriptions Disp Refills  . ANORO ELLIPTA 62.5-25 MCG/ACT AEPB [Pharmacy Med Name: Celedonio Miyamoto 62.5-25 ORAL INH(30S)] 180 each 1    Sig: INHALE 1 PUFF INTO THE LUNGS DAILY     Pulmonology:  [employer] Passed - 02/27/2022  3:10 AM      Passed - Valid encounter within last 12 months    Recent Outpatient Visits          2 weeks ago Hypertension associated with diabetes ([place])   [facility] Jon Billings, NP   2 months ago Acute bacterial conjunctivitis of both eyes   [place], [place] T, NP   3 months ago Hypertension associated with diabetes ([place])   [facility] Jon Billings, NP   9 months ago Hypertension associated with diabetes [facility])   [facility] Jon Billings, NP   10 months ago Hypertension associated with diabetes [facility])   [place], MD      Future Appointments            In 2 months Jon Billings, NP [facility], Secretary

## 2022-03-01 DIAGNOSIS — D631 Anemia in chronic kidney disease: Secondary | ICD-10-CM | POA: Diagnosis not present

## 2022-03-01 DIAGNOSIS — Z992 Dependence on renal dialysis: Secondary | ICD-10-CM | POA: Diagnosis not present

## 2022-03-01 DIAGNOSIS — N2581 Secondary hyperparathyroidism of renal origin: Secondary | ICD-10-CM | POA: Diagnosis not present

## 2022-03-01 DIAGNOSIS — N186 End stage renal disease: Secondary | ICD-10-CM | POA: Diagnosis not present

## 2022-03-01 DIAGNOSIS — D509 Iron deficiency anemia, unspecified: Secondary | ICD-10-CM | POA: Diagnosis not present

## 2022-03-01 DIAGNOSIS — N250 Renal osteodystrophy: Secondary | ICD-10-CM | POA: Diagnosis not present

## 2022-03-03 DIAGNOSIS — N186 End stage renal disease: Secondary | ICD-10-CM | POA: Diagnosis not present

## 2022-03-03 DIAGNOSIS — N250 Renal osteodystrophy: Secondary | ICD-10-CM | POA: Diagnosis not present

## 2022-03-03 DIAGNOSIS — Z992 Dependence on renal dialysis: Secondary | ICD-10-CM | POA: Diagnosis not present

## 2022-03-03 DIAGNOSIS — D631 Anemia in chronic kidney disease: Secondary | ICD-10-CM | POA: Diagnosis not present

## 2022-03-03 DIAGNOSIS — N2581 Secondary hyperparathyroidism of renal origin: Secondary | ICD-10-CM | POA: Diagnosis not present

## 2022-03-03 DIAGNOSIS — D509 Iron deficiency anemia, unspecified: Secondary | ICD-10-CM | POA: Diagnosis not present

## 2022-03-06 DIAGNOSIS — D509 Iron deficiency anemia, unspecified: Secondary | ICD-10-CM | POA: Diagnosis not present

## 2022-03-06 DIAGNOSIS — N2581 Secondary hyperparathyroidism of renal origin: Secondary | ICD-10-CM | POA: Diagnosis not present

## 2022-03-06 DIAGNOSIS — Z992 Dependence on renal dialysis: Secondary | ICD-10-CM | POA: Diagnosis not present

## 2022-03-06 DIAGNOSIS — N186 End stage renal disease: Secondary | ICD-10-CM | POA: Diagnosis not present

## 2022-03-06 DIAGNOSIS — N250 Renal osteodystrophy: Secondary | ICD-10-CM | POA: Diagnosis not present

## 2022-03-06 DIAGNOSIS — D631 Anemia in chronic kidney disease: Secondary | ICD-10-CM | POA: Diagnosis not present

## 2022-03-08 DIAGNOSIS — Z992 Dependence on renal dialysis: Secondary | ICD-10-CM | POA: Diagnosis not present

## 2022-03-08 DIAGNOSIS — N186 End stage renal disease: Secondary | ICD-10-CM | POA: Diagnosis not present

## 2022-03-08 DIAGNOSIS — N250 Renal osteodystrophy: Secondary | ICD-10-CM | POA: Diagnosis not present

## 2022-03-08 DIAGNOSIS — D631 Anemia in chronic kidney disease: Secondary | ICD-10-CM | POA: Diagnosis not present

## 2022-03-08 DIAGNOSIS — N2581 Secondary hyperparathyroidism of renal origin: Secondary | ICD-10-CM | POA: Diagnosis not present

## 2022-03-08 DIAGNOSIS — D509 Iron deficiency anemia, unspecified: Secondary | ICD-10-CM | POA: Diagnosis not present

## 2022-03-09 DIAGNOSIS — Z992 Dependence on renal dialysis: Secondary | ICD-10-CM | POA: Diagnosis not present

## 2022-03-09 DIAGNOSIS — N186 End stage renal disease: Secondary | ICD-10-CM | POA: Diagnosis not present

## 2022-03-10 DIAGNOSIS — N2581 Secondary hyperparathyroidism of renal origin: Secondary | ICD-10-CM | POA: Diagnosis not present

## 2022-03-10 DIAGNOSIS — N250 Renal osteodystrophy: Secondary | ICD-10-CM | POA: Diagnosis not present

## 2022-03-10 DIAGNOSIS — Z992 Dependence on renal dialysis: Secondary | ICD-10-CM | POA: Diagnosis not present

## 2022-03-10 DIAGNOSIS — N186 End stage renal disease: Secondary | ICD-10-CM | POA: Diagnosis not present

## 2022-03-10 DIAGNOSIS — D631 Anemia in chronic kidney disease: Secondary | ICD-10-CM | POA: Diagnosis not present

## 2022-03-10 DIAGNOSIS — D509 Iron deficiency anemia, unspecified: Secondary | ICD-10-CM | POA: Diagnosis not present

## 2022-03-13 DIAGNOSIS — N186 End stage renal disease: Secondary | ICD-10-CM | POA: Diagnosis not present

## 2022-03-13 DIAGNOSIS — N250 Renal osteodystrophy: Secondary | ICD-10-CM | POA: Diagnosis not present

## 2022-03-13 DIAGNOSIS — N2581 Secondary hyperparathyroidism of renal origin: Secondary | ICD-10-CM | POA: Diagnosis not present

## 2022-03-13 DIAGNOSIS — D631 Anemia in chronic kidney disease: Secondary | ICD-10-CM | POA: Diagnosis not present

## 2022-03-13 DIAGNOSIS — D509 Iron deficiency anemia, unspecified: Secondary | ICD-10-CM | POA: Diagnosis not present

## 2022-03-13 DIAGNOSIS — Z992 Dependence on renal dialysis: Secondary | ICD-10-CM | POA: Diagnosis not present

## 2022-03-15 ENCOUNTER — Telehealth: Payer: Self-pay

## 2022-03-15 DIAGNOSIS — D631 Anemia in chronic kidney disease: Secondary | ICD-10-CM | POA: Diagnosis not present

## 2022-03-15 DIAGNOSIS — N2581 Secondary hyperparathyroidism of renal origin: Secondary | ICD-10-CM | POA: Diagnosis not present

## 2022-03-15 DIAGNOSIS — D509 Iron deficiency anemia, unspecified: Secondary | ICD-10-CM | POA: Diagnosis not present

## 2022-03-15 DIAGNOSIS — N250 Renal osteodystrophy: Secondary | ICD-10-CM | POA: Diagnosis not present

## 2022-03-15 DIAGNOSIS — N186 End stage renal disease: Secondary | ICD-10-CM | POA: Diagnosis not present

## 2022-03-15 DIAGNOSIS — Z992 Dependence on renal dialysis: Secondary | ICD-10-CM | POA: Diagnosis not present

## 2022-03-15 NOTE — Chronic Care Management (AMB) (Signed)
  Care Coordination   Note   03/15/2022 Name: [name] MRN: [id number] DOB: 1951-06-10  Timothy Gibson is a 71 y.o. year old male who sees Jon Billings, NP for primary care. I reached out to [name] by phone today to offer care coordination services.  [name] was given information about Care Coordination services today including:   The Care Coordination services include support from the care team which includes your Nurse Coordinator, Clinical Social Worker, or Pharmacist.  The Care Coordination team is here to help remove barriers to the health concerns and goals most important to you. Care Coordination services are voluntary, and the patient may decline or stop services at any time by request to their care team member.   Care Coordination Consent Status: Patient did not agree to participate in care coordination services at this time.   Encounter Outcome:  Pt. Refused  Noreene Larsson, [place], [place] [address] Direct Dial: [phone] [email or web address]

## 2022-03-17 DIAGNOSIS — Z992 Dependence on renal dialysis: Secondary | ICD-10-CM | POA: Diagnosis not present

## 2022-03-17 DIAGNOSIS — D631 Anemia in chronic kidney disease: Secondary | ICD-10-CM | POA: Diagnosis not present

## 2022-03-17 DIAGNOSIS — N250 Renal osteodystrophy: Secondary | ICD-10-CM | POA: Diagnosis not present

## 2022-03-17 DIAGNOSIS — N2581 Secondary hyperparathyroidism of renal origin: Secondary | ICD-10-CM | POA: Diagnosis not present

## 2022-03-17 DIAGNOSIS — N186 End stage renal disease: Secondary | ICD-10-CM | POA: Diagnosis not present

## 2022-03-17 DIAGNOSIS — D509 Iron deficiency anemia, unspecified: Secondary | ICD-10-CM | POA: Diagnosis not present

## 2022-03-20 DIAGNOSIS — N2581 Secondary hyperparathyroidism of renal origin: Secondary | ICD-10-CM | POA: Diagnosis not present

## 2022-03-20 DIAGNOSIS — Z992 Dependence on renal dialysis: Secondary | ICD-10-CM | POA: Diagnosis not present

## 2022-03-20 DIAGNOSIS — D631 Anemia in chronic kidney disease: Secondary | ICD-10-CM | POA: Diagnosis not present

## 2022-03-20 DIAGNOSIS — N250 Renal osteodystrophy: Secondary | ICD-10-CM | POA: Diagnosis not present

## 2022-03-20 DIAGNOSIS — N186 End stage renal disease: Secondary | ICD-10-CM | POA: Diagnosis not present

## 2022-03-20 DIAGNOSIS — D509 Iron deficiency anemia, unspecified: Secondary | ICD-10-CM | POA: Diagnosis not present

## 2022-03-22 DIAGNOSIS — D509 Iron deficiency anemia, unspecified: Secondary | ICD-10-CM | POA: Diagnosis not present

## 2022-03-22 DIAGNOSIS — D631 Anemia in chronic kidney disease: Secondary | ICD-10-CM | POA: Diagnosis not present

## 2022-03-22 DIAGNOSIS — N250 Renal osteodystrophy: Secondary | ICD-10-CM | POA: Diagnosis not present

## 2022-03-22 DIAGNOSIS — Z992 Dependence on renal dialysis: Secondary | ICD-10-CM | POA: Diagnosis not present

## 2022-03-22 DIAGNOSIS — N186 End stage renal disease: Secondary | ICD-10-CM | POA: Diagnosis not present

## 2022-03-22 DIAGNOSIS — N2581 Secondary hyperparathyroidism of renal origin: Secondary | ICD-10-CM | POA: Diagnosis not present

## 2022-03-24 DIAGNOSIS — D631 Anemia in chronic kidney disease: Secondary | ICD-10-CM | POA: Diagnosis not present

## 2022-03-24 DIAGNOSIS — N186 End stage renal disease: Secondary | ICD-10-CM | POA: Diagnosis not present

## 2022-03-24 DIAGNOSIS — N2581 Secondary hyperparathyroidism of renal origin: Secondary | ICD-10-CM | POA: Diagnosis not present

## 2022-03-24 DIAGNOSIS — N250 Renal osteodystrophy: Secondary | ICD-10-CM | POA: Diagnosis not present

## 2022-03-24 DIAGNOSIS — Z992 Dependence on renal dialysis: Secondary | ICD-10-CM | POA: Diagnosis not present

## 2022-03-24 DIAGNOSIS — D509 Iron deficiency anemia, unspecified: Secondary | ICD-10-CM | POA: Diagnosis not present

## 2022-03-27 DIAGNOSIS — Z992 Dependence on renal dialysis: Secondary | ICD-10-CM | POA: Diagnosis not present

## 2022-03-27 DIAGNOSIS — D509 Iron deficiency anemia, unspecified: Secondary | ICD-10-CM | POA: Diagnosis not present

## 2022-03-27 DIAGNOSIS — N2581 Secondary hyperparathyroidism of renal origin: Secondary | ICD-10-CM | POA: Diagnosis not present

## 2022-03-27 DIAGNOSIS — N186 End stage renal disease: Secondary | ICD-10-CM | POA: Diagnosis not present

## 2022-03-27 DIAGNOSIS — N250 Renal osteodystrophy: Secondary | ICD-10-CM | POA: Diagnosis not present

## 2022-03-27 DIAGNOSIS — D631 Anemia in chronic kidney disease: Secondary | ICD-10-CM | POA: Diagnosis not present

## 2022-03-29 DIAGNOSIS — Z992 Dependence on renal dialysis: Secondary | ICD-10-CM | POA: Diagnosis not present

## 2022-03-29 DIAGNOSIS — N2581 Secondary hyperparathyroidism of renal origin: Secondary | ICD-10-CM | POA: Diagnosis not present

## 2022-03-29 DIAGNOSIS — D631 Anemia in chronic kidney disease: Secondary | ICD-10-CM | POA: Diagnosis not present

## 2022-03-29 DIAGNOSIS — N250 Renal osteodystrophy: Secondary | ICD-10-CM | POA: Diagnosis not present

## 2022-03-29 DIAGNOSIS — D509 Iron deficiency anemia, unspecified: Secondary | ICD-10-CM | POA: Diagnosis not present

## 2022-03-29 DIAGNOSIS — N186 End stage renal disease: Secondary | ICD-10-CM | POA: Diagnosis not present

## 2022-03-31 DIAGNOSIS — N2581 Secondary hyperparathyroidism of renal origin: Secondary | ICD-10-CM | POA: Diagnosis not present

## 2022-03-31 DIAGNOSIS — Z992 Dependence on renal dialysis: Secondary | ICD-10-CM | POA: Diagnosis not present

## 2022-03-31 DIAGNOSIS — D509 Iron deficiency anemia, unspecified: Secondary | ICD-10-CM | POA: Diagnosis not present

## 2022-03-31 DIAGNOSIS — N250 Renal osteodystrophy: Secondary | ICD-10-CM | POA: Diagnosis not present

## 2022-03-31 DIAGNOSIS — N186 End stage renal disease: Secondary | ICD-10-CM | POA: Diagnosis not present

## 2022-03-31 DIAGNOSIS — D631 Anemia in chronic kidney disease: Secondary | ICD-10-CM | POA: Diagnosis not present

## 2022-04-03 DIAGNOSIS — D631 Anemia in chronic kidney disease: Secondary | ICD-10-CM | POA: Diagnosis not present

## 2022-04-03 DIAGNOSIS — N250 Renal osteodystrophy: Secondary | ICD-10-CM | POA: Diagnosis not present

## 2022-04-03 DIAGNOSIS — N2581 Secondary hyperparathyroidism of renal origin: Secondary | ICD-10-CM | POA: Diagnosis not present

## 2022-04-03 DIAGNOSIS — D509 Iron deficiency anemia, unspecified: Secondary | ICD-10-CM | POA: Diagnosis not present

## 2022-04-03 DIAGNOSIS — N186 End stage renal disease: Secondary | ICD-10-CM | POA: Diagnosis not present

## 2022-04-03 DIAGNOSIS — Z992 Dependence on renal dialysis: Secondary | ICD-10-CM | POA: Diagnosis not present

## 2022-04-05 DIAGNOSIS — N250 Renal osteodystrophy: Secondary | ICD-10-CM | POA: Diagnosis not present

## 2022-04-05 DIAGNOSIS — D509 Iron deficiency anemia, unspecified: Secondary | ICD-10-CM | POA: Diagnosis not present

## 2022-04-05 DIAGNOSIS — N2581 Secondary hyperparathyroidism of renal origin: Secondary | ICD-10-CM | POA: Diagnosis not present

## 2022-04-05 DIAGNOSIS — Z992 Dependence on renal dialysis: Secondary | ICD-10-CM | POA: Diagnosis not present

## 2022-04-05 DIAGNOSIS — N186 End stage renal disease: Secondary | ICD-10-CM | POA: Diagnosis not present

## 2022-04-05 DIAGNOSIS — D631 Anemia in chronic kidney disease: Secondary | ICD-10-CM | POA: Diagnosis not present

## 2022-04-07 DIAGNOSIS — D631 Anemia in chronic kidney disease: Secondary | ICD-10-CM | POA: Diagnosis not present

## 2022-04-07 DIAGNOSIS — N250 Renal osteodystrophy: Secondary | ICD-10-CM | POA: Diagnosis not present

## 2022-04-07 DIAGNOSIS — N2581 Secondary hyperparathyroidism of renal origin: Secondary | ICD-10-CM | POA: Diagnosis not present

## 2022-04-07 DIAGNOSIS — Z992 Dependence on renal dialysis: Secondary | ICD-10-CM | POA: Diagnosis not present

## 2022-04-07 DIAGNOSIS — N186 End stage renal disease: Secondary | ICD-10-CM | POA: Diagnosis not present

## 2022-04-07 DIAGNOSIS — D509 Iron deficiency anemia, unspecified: Secondary | ICD-10-CM | POA: Diagnosis not present

## 2022-04-08 DIAGNOSIS — N186 End stage renal disease: Secondary | ICD-10-CM | POA: Diagnosis not present

## 2022-04-08 DIAGNOSIS — Z992 Dependence on renal dialysis: Secondary | ICD-10-CM | POA: Diagnosis not present

## 2022-04-10 DIAGNOSIS — Z23 Encounter for immunization: Secondary | ICD-10-CM | POA: Diagnosis not present

## 2022-04-10 DIAGNOSIS — D631 Anemia in chronic kidney disease: Secondary | ICD-10-CM | POA: Diagnosis not present

## 2022-04-10 DIAGNOSIS — N186 End stage renal disease: Secondary | ICD-10-CM | POA: Diagnosis not present

## 2022-04-10 DIAGNOSIS — N2581 Secondary hyperparathyroidism of renal origin: Secondary | ICD-10-CM | POA: Diagnosis not present

## 2022-04-10 DIAGNOSIS — N250 Renal osteodystrophy: Secondary | ICD-10-CM | POA: Diagnosis not present

## 2022-04-10 DIAGNOSIS — D509 Iron deficiency anemia, unspecified: Secondary | ICD-10-CM | POA: Diagnosis not present

## 2022-04-10 DIAGNOSIS — Z992 Dependence on renal dialysis: Secondary | ICD-10-CM | POA: Diagnosis not present

## 2022-04-12 DIAGNOSIS — D509 Iron deficiency anemia, unspecified: Secondary | ICD-10-CM | POA: Diagnosis not present

## 2022-04-12 DIAGNOSIS — Z23 Encounter for immunization: Secondary | ICD-10-CM | POA: Diagnosis not present

## 2022-04-12 DIAGNOSIS — Z992 Dependence on renal dialysis: Secondary | ICD-10-CM | POA: Diagnosis not present

## 2022-04-12 DIAGNOSIS — N2581 Secondary hyperparathyroidism of renal origin: Secondary | ICD-10-CM | POA: Diagnosis not present

## 2022-04-12 DIAGNOSIS — N186 End stage renal disease: Secondary | ICD-10-CM | POA: Diagnosis not present

## 2022-04-12 DIAGNOSIS — D631 Anemia in chronic kidney disease: Secondary | ICD-10-CM | POA: Diagnosis not present

## 2022-04-14 DIAGNOSIS — N186 End stage renal disease: Secondary | ICD-10-CM | POA: Diagnosis not present

## 2022-04-14 DIAGNOSIS — Z23 Encounter for immunization: Secondary | ICD-10-CM | POA: Diagnosis not present

## 2022-04-14 DIAGNOSIS — D631 Anemia in chronic kidney disease: Secondary | ICD-10-CM | POA: Diagnosis not present

## 2022-04-14 DIAGNOSIS — Z992 Dependence on renal dialysis: Secondary | ICD-10-CM | POA: Diagnosis not present

## 2022-04-14 DIAGNOSIS — D509 Iron deficiency anemia, unspecified: Secondary | ICD-10-CM | POA: Diagnosis not present

## 2022-04-14 DIAGNOSIS — N2581 Secondary hyperparathyroidism of renal origin: Secondary | ICD-10-CM | POA: Diagnosis not present

## 2022-04-17 DIAGNOSIS — E119 Type 2 diabetes mellitus without complications: Secondary | ICD-10-CM | POA: Diagnosis not present

## 2022-04-17 DIAGNOSIS — D631 Anemia in chronic kidney disease: Secondary | ICD-10-CM | POA: Diagnosis not present

## 2022-04-17 DIAGNOSIS — Z23 Encounter for immunization: Secondary | ICD-10-CM | POA: Diagnosis not present

## 2022-04-17 DIAGNOSIS — Z992 Dependence on renal dialysis: Secondary | ICD-10-CM | POA: Diagnosis not present

## 2022-04-17 DIAGNOSIS — D509 Iron deficiency anemia, unspecified: Secondary | ICD-10-CM | POA: Diagnosis not present

## 2022-04-17 DIAGNOSIS — N2581 Secondary hyperparathyroidism of renal origin: Secondary | ICD-10-CM | POA: Diagnosis not present

## 2022-04-17 DIAGNOSIS — N186 End stage renal disease: Secondary | ICD-10-CM | POA: Diagnosis not present

## 2022-04-19 DIAGNOSIS — Z23 Encounter for immunization: Secondary | ICD-10-CM | POA: Diagnosis not present

## 2022-04-19 DIAGNOSIS — D631 Anemia in chronic kidney disease: Secondary | ICD-10-CM | POA: Diagnosis not present

## 2022-04-19 DIAGNOSIS — Z992 Dependence on renal dialysis: Secondary | ICD-10-CM | POA: Diagnosis not present

## 2022-04-19 DIAGNOSIS — D509 Iron deficiency anemia, unspecified: Secondary | ICD-10-CM | POA: Diagnosis not present

## 2022-04-19 DIAGNOSIS — N186 End stage renal disease: Secondary | ICD-10-CM | POA: Diagnosis not present

## 2022-04-19 DIAGNOSIS — N2581 Secondary hyperparathyroidism of renal origin: Secondary | ICD-10-CM | POA: Diagnosis not present

## 2022-04-21 DIAGNOSIS — Z23 Encounter for immunization: Secondary | ICD-10-CM | POA: Diagnosis not present

## 2022-04-21 DIAGNOSIS — D631 Anemia in chronic kidney disease: Secondary | ICD-10-CM | POA: Diagnosis not present

## 2022-04-21 DIAGNOSIS — Z992 Dependence on renal dialysis: Secondary | ICD-10-CM | POA: Diagnosis not present

## 2022-04-21 DIAGNOSIS — N186 End stage renal disease: Secondary | ICD-10-CM | POA: Diagnosis not present

## 2022-04-21 DIAGNOSIS — N2581 Secondary hyperparathyroidism of renal origin: Secondary | ICD-10-CM | POA: Diagnosis not present

## 2022-04-21 DIAGNOSIS — D509 Iron deficiency anemia, unspecified: Secondary | ICD-10-CM | POA: Diagnosis not present

## 2022-04-24 DIAGNOSIS — Z992 Dependence on renal dialysis: Secondary | ICD-10-CM | POA: Diagnosis not present

## 2022-04-24 DIAGNOSIS — N2581 Secondary hyperparathyroidism of renal origin: Secondary | ICD-10-CM | POA: Diagnosis not present

## 2022-04-24 DIAGNOSIS — D631 Anemia in chronic kidney disease: Secondary | ICD-10-CM | POA: Diagnosis not present

## 2022-04-24 DIAGNOSIS — D509 Iron deficiency anemia, unspecified: Secondary | ICD-10-CM | POA: Diagnosis not present

## 2022-04-24 DIAGNOSIS — N186 End stage renal disease: Secondary | ICD-10-CM | POA: Diagnosis not present

## 2022-04-24 DIAGNOSIS — Z23 Encounter for immunization: Secondary | ICD-10-CM | POA: Diagnosis not present

## 2022-04-25 DIAGNOSIS — Z794 Long term (current) use of insulin: Secondary | ICD-10-CM | POA: Diagnosis not present

## 2022-04-25 DIAGNOSIS — E1140 Type 2 diabetes mellitus with diabetic neuropathy, unspecified: Secondary | ICD-10-CM | POA: Diagnosis not present

## 2022-04-25 DIAGNOSIS — B351 Tinea unguium: Secondary | ICD-10-CM | POA: Diagnosis not present

## 2022-04-26 DIAGNOSIS — D631 Anemia in chronic kidney disease: Secondary | ICD-10-CM | POA: Diagnosis not present

## 2022-04-26 DIAGNOSIS — Z992 Dependence on renal dialysis: Secondary | ICD-10-CM | POA: Diagnosis not present

## 2022-04-26 DIAGNOSIS — D509 Iron deficiency anemia, unspecified: Secondary | ICD-10-CM | POA: Diagnosis not present

## 2022-04-26 DIAGNOSIS — Z23 Encounter for immunization: Secondary | ICD-10-CM | POA: Diagnosis not present

## 2022-04-26 DIAGNOSIS — N2581 Secondary hyperparathyroidism of renal origin: Secondary | ICD-10-CM | POA: Diagnosis not present

## 2022-04-26 DIAGNOSIS — N186 End stage renal disease: Secondary | ICD-10-CM | POA: Diagnosis not present

## 2022-04-28 DIAGNOSIS — N186 End stage renal disease: Secondary | ICD-10-CM | POA: Diagnosis not present

## 2022-04-28 DIAGNOSIS — D631 Anemia in chronic kidney disease: Secondary | ICD-10-CM | POA: Diagnosis not present

## 2022-04-28 DIAGNOSIS — Z23 Encounter for immunization: Secondary | ICD-10-CM | POA: Diagnosis not present

## 2022-04-28 DIAGNOSIS — N2581 Secondary hyperparathyroidism of renal origin: Secondary | ICD-10-CM | POA: Diagnosis not present

## 2022-04-28 DIAGNOSIS — D509 Iron deficiency anemia, unspecified: Secondary | ICD-10-CM | POA: Diagnosis not present

## 2022-04-28 DIAGNOSIS — Z992 Dependence on renal dialysis: Secondary | ICD-10-CM | POA: Diagnosis not present

## 2022-05-01 DIAGNOSIS — D631 Anemia in chronic kidney disease: Secondary | ICD-10-CM | POA: Diagnosis not present

## 2022-05-01 DIAGNOSIS — Z23 Encounter for immunization: Secondary | ICD-10-CM | POA: Diagnosis not present

## 2022-05-01 DIAGNOSIS — N2581 Secondary hyperparathyroidism of renal origin: Secondary | ICD-10-CM | POA: Diagnosis not present

## 2022-05-01 DIAGNOSIS — Z992 Dependence on renal dialysis: Secondary | ICD-10-CM | POA: Diagnosis not present

## 2022-05-01 DIAGNOSIS — D509 Iron deficiency anemia, unspecified: Secondary | ICD-10-CM | POA: Diagnosis not present

## 2022-05-01 DIAGNOSIS — N186 End stage renal disease: Secondary | ICD-10-CM | POA: Diagnosis not present

## 2022-05-03 DIAGNOSIS — N186 End stage renal disease: Secondary | ICD-10-CM | POA: Diagnosis not present

## 2022-05-03 DIAGNOSIS — N2581 Secondary hyperparathyroidism of renal origin: Secondary | ICD-10-CM | POA: Diagnosis not present

## 2022-05-03 DIAGNOSIS — D509 Iron deficiency anemia, unspecified: Secondary | ICD-10-CM | POA: Diagnosis not present

## 2022-05-03 DIAGNOSIS — Z23 Encounter for immunization: Secondary | ICD-10-CM | POA: Diagnosis not present

## 2022-05-03 DIAGNOSIS — D631 Anemia in chronic kidney disease: Secondary | ICD-10-CM | POA: Diagnosis not present

## 2022-05-03 DIAGNOSIS — Z992 Dependence on renal dialysis: Secondary | ICD-10-CM | POA: Diagnosis not present

## 2022-05-05 DIAGNOSIS — N186 End stage renal disease: Secondary | ICD-10-CM | POA: Diagnosis not present

## 2022-05-05 DIAGNOSIS — D631 Anemia in chronic kidney disease: Secondary | ICD-10-CM | POA: Diagnosis not present

## 2022-05-05 DIAGNOSIS — Z992 Dependence on renal dialysis: Secondary | ICD-10-CM | POA: Diagnosis not present

## 2022-05-05 DIAGNOSIS — D509 Iron deficiency anemia, unspecified: Secondary | ICD-10-CM | POA: Diagnosis not present

## 2022-05-05 DIAGNOSIS — Z23 Encounter for immunization: Secondary | ICD-10-CM | POA: Diagnosis not present

## 2022-05-05 DIAGNOSIS — N2581 Secondary hyperparathyroidism of renal origin: Secondary | ICD-10-CM | POA: Diagnosis not present

## 2022-05-08 DIAGNOSIS — D509 Iron deficiency anemia, unspecified: Secondary | ICD-10-CM | POA: Diagnosis not present

## 2022-05-08 DIAGNOSIS — Z992 Dependence on renal dialysis: Secondary | ICD-10-CM | POA: Diagnosis not present

## 2022-05-08 DIAGNOSIS — D631 Anemia in chronic kidney disease: Secondary | ICD-10-CM | POA: Diagnosis not present

## 2022-05-08 DIAGNOSIS — Z23 Encounter for immunization: Secondary | ICD-10-CM | POA: Diagnosis not present

## 2022-05-08 DIAGNOSIS — N2581 Secondary hyperparathyroidism of renal origin: Secondary | ICD-10-CM | POA: Diagnosis not present

## 2022-05-08 DIAGNOSIS — N186 End stage renal disease: Secondary | ICD-10-CM | POA: Diagnosis not present

## 2022-05-09 DIAGNOSIS — N186 End stage renal disease: Secondary | ICD-10-CM | POA: Diagnosis not present

## 2022-05-09 DIAGNOSIS — Z992 Dependence on renal dialysis: Secondary | ICD-10-CM | POA: Diagnosis not present

## 2022-05-10 DIAGNOSIS — N250 Renal osteodystrophy: Secondary | ICD-10-CM | POA: Diagnosis not present

## 2022-05-10 DIAGNOSIS — Z992 Dependence on renal dialysis: Secondary | ICD-10-CM | POA: Diagnosis not present

## 2022-05-10 DIAGNOSIS — D631 Anemia in chronic kidney disease: Secondary | ICD-10-CM | POA: Diagnosis not present

## 2022-05-10 DIAGNOSIS — N2581 Secondary hyperparathyroidism of renal origin: Secondary | ICD-10-CM | POA: Diagnosis not present

## 2022-05-10 DIAGNOSIS — N186 End stage renal disease: Secondary | ICD-10-CM | POA: Diagnosis not present

## 2022-05-10 DIAGNOSIS — D509 Iron deficiency anemia, unspecified: Secondary | ICD-10-CM | POA: Diagnosis not present

## 2022-05-12 DIAGNOSIS — D509 Iron deficiency anemia, unspecified: Secondary | ICD-10-CM | POA: Diagnosis not present

## 2022-05-12 DIAGNOSIS — N186 End stage renal disease: Secondary | ICD-10-CM | POA: Diagnosis not present

## 2022-05-12 DIAGNOSIS — N250 Renal osteodystrophy: Secondary | ICD-10-CM | POA: Diagnosis not present

## 2022-05-12 DIAGNOSIS — Z992 Dependence on renal dialysis: Secondary | ICD-10-CM | POA: Diagnosis not present

## 2022-05-12 DIAGNOSIS — D631 Anemia in chronic kidney disease: Secondary | ICD-10-CM | POA: Diagnosis not present

## 2022-05-12 DIAGNOSIS — N2581 Secondary hyperparathyroidism of renal origin: Secondary | ICD-10-CM | POA: Diagnosis not present

## 2022-05-15 DIAGNOSIS — N2581 Secondary hyperparathyroidism of renal origin: Secondary | ICD-10-CM | POA: Diagnosis not present

## 2022-05-15 DIAGNOSIS — N250 Renal osteodystrophy: Secondary | ICD-10-CM | POA: Diagnosis not present

## 2022-05-15 DIAGNOSIS — Z992 Dependence on renal dialysis: Secondary | ICD-10-CM | POA: Diagnosis not present

## 2022-05-15 DIAGNOSIS — D631 Anemia in chronic kidney disease: Secondary | ICD-10-CM | POA: Diagnosis not present

## 2022-05-15 DIAGNOSIS — N186 End stage renal disease: Secondary | ICD-10-CM | POA: Diagnosis not present

## 2022-05-15 DIAGNOSIS — D509 Iron deficiency anemia, unspecified: Secondary | ICD-10-CM | POA: Diagnosis not present

## 2022-05-17 DIAGNOSIS — N2581 Secondary hyperparathyroidism of renal origin: Secondary | ICD-10-CM | POA: Diagnosis not present

## 2022-05-17 DIAGNOSIS — N186 End stage renal disease: Secondary | ICD-10-CM | POA: Diagnosis not present

## 2022-05-17 DIAGNOSIS — D509 Iron deficiency anemia, unspecified: Secondary | ICD-10-CM | POA: Diagnosis not present

## 2022-05-17 DIAGNOSIS — N250 Renal osteodystrophy: Secondary | ICD-10-CM | POA: Diagnosis not present

## 2022-05-17 DIAGNOSIS — D631 Anemia in chronic kidney disease: Secondary | ICD-10-CM | POA: Diagnosis not present

## 2022-05-17 DIAGNOSIS — Z992 Dependence on renal dialysis: Secondary | ICD-10-CM | POA: Diagnosis not present

## 2022-05-17 NOTE — Progress Notes (Deleted)
There were no vitals taken for this visit.   Subjective:    Patient ID: Timothy Gibson, male    DOB: [birth year], 71 y.o.   MRN: [id number]  HPI: Timothy Gibson is a 71 y.o. male  No chief complaint on file.  HYPERTENSION Hypertension status: uncontrolled  Satisfied with current treatment? yes Duration of hypertension: years BP monitoring frequency:   checked frequently at dialysis BP range:  BP medication side effects:  no Medication compliance: excellent compliance Previous BP meds: Metoprolol and valsartan Aspirin: no Recurrent headaches: no Visual changes: no Palpitations: no Dyspnea: no Chest pain: no Lower extremity edema: no Dizzy/lightheaded: no  DIABETES Hypoglycemic episodes:no Polydipsia/polyuria: no Visual disturbance: no Chest pain: no Paresthesias: no Glucose Monitoring: no  Accucheck frequency: Not Checking  Fasting glucose:  Post prandial:  Evening:  Before meals: Taking Insulin?: yes  Long acting insulin: Tresiba 10u  Short acting insulin: Blood Pressure Monitoring:  checked at dialysis Retinal Examination: Not up to Date Foot Exam: Not up to Date Diabetic Education: Not Completed Pneumovax: Up to Date Influenza: Up to Date Aspirin: no  DIALYSIS M,W,F.  Patient states he saw Dr. Holley Raring yesterday.  He was pleased with his A1c.  Does not urinate at all.   Patient states he sleeps in a chair.  He has a rash in his groin area.     Relevant past medical, surgical, family and social history reviewed and updated as indicated. Interim medical history since our last visit reviewed. Allergies and medications reviewed and updated.  Review of Systems  HENT:  Nosebleeds: cmp. Trouble swallowing: c.   Eyes:  Negative for visual disturbance.  Respiratory:  Negative for chest tightness and shortness of breath.   Cardiovascular:  Negative for chest pain, palpitations and leg swelling.  Endocrine: Negative for polydipsia and polyuria.  Skin:   Positive for rash.  Neurological:  Negative for dizziness, light-headedness, numbness and headaches.    Per HPI unless specifically indicated above     Objective:    There were no vitals taken for this visit.  Wt Readings from Last 3 Encounters:  02/14/22 247 lb 1.6 oz (112.1 kg)  12/02/21 247 lb 3.2 oz (112.1 kg)  11/10/21 239 lb 9.6 oz (108.7 kg)    Physical Exam Vitals and nursing note reviewed.  Constitutional:      General: He is not in acute distress.    Appearance: Normal appearance. He is not ill-appearing, toxic-appearing or diaphoretic.  HENT:     Head: Normocephalic.     Right Ear: External ear normal.     Left Ear: External ear normal.     Nose: Nose normal. No congestion or rhinorrhea.     Mouth/Throat:     Mouth: Mucous membranes are moist.  Eyes:     General:        Right eye: No discharge.        Left eye: No discharge.     Extraocular Movements: Extraocular movements intact.     Conjunctiva/sclera: Conjunctivae normal.     Pupils: Pupils are equal, round, and reactive to light.  Cardiovascular:     Rate and Rhythm: Normal rate and regular rhythm.     Heart sounds: No murmur heard. Pulmonary:     Effort: Pulmonary effort is normal. No respiratory distress.     Breath sounds: Normal breath sounds. No wheezing, rhonchi or rales.  Abdominal:     General: Abdomen is flat. Bowel sounds are normal.  Musculoskeletal:  Cervical back: Normal range of motion and neck supple.     Comments: Uses cane  Skin:    General: Skin is warm and dry.     Capillary Refill: Capillary refill takes less than 2 seconds.  Neurological:     General: No focal deficit present.     Mental Status: He is alert and oriented to person, place, and time.  Psychiatric:        Mood and Affect: Mood normal.        Behavior: Behavior normal.        Thought Content: Thought content normal.        Judgment: Judgment normal.     Results for orders placed or performed in visit on  02/15/22  HM DIABETES EYE EXAM  Result Value Ref Range   HM Diabetic Eye Exam Retinopathy (A) No Retinopathy      Assessment & Plan:   Problem List Items Addressed This Visit   None    Follow up plan: No follow-ups on file.   A total of 30 minutes were spent on this encounter today.  When total time is documented, this includes both the face-to-face and non-face-to-face time personally spent before, during and after the visit on the date of the encounter reviewing lab results from previous visit, discussing blood pressure elevation and performing foot exam.

## 2022-05-18 ENCOUNTER — Ambulatory Visit: Payer: Medicare Other | Admitting: Nurse Practitioner

## 2022-05-18 DIAGNOSIS — I7025 Atherosclerosis of native arteries of other extremities with ulceration: Secondary | ICD-10-CM

## 2022-05-18 DIAGNOSIS — I70213 Atherosclerosis of native arteries of extremities with intermittent claudication, bilateral legs: Secondary | ICD-10-CM

## 2022-05-18 DIAGNOSIS — E1165 Type 2 diabetes mellitus with hyperglycemia: Secondary | ICD-10-CM

## 2022-05-18 DIAGNOSIS — N186 End stage renal disease: Secondary | ICD-10-CM

## 2022-05-18 DIAGNOSIS — E1159 Type 2 diabetes mellitus with other circulatory complications: Secondary | ICD-10-CM

## 2022-05-18 DIAGNOSIS — J449 Chronic obstructive pulmonary disease, unspecified: Secondary | ICD-10-CM

## 2022-05-18 DIAGNOSIS — I5032 Chronic diastolic (congestive) heart failure: Secondary | ICD-10-CM

## 2022-05-19 DIAGNOSIS — N250 Renal osteodystrophy: Secondary | ICD-10-CM | POA: Diagnosis not present

## 2022-05-19 DIAGNOSIS — D509 Iron deficiency anemia, unspecified: Secondary | ICD-10-CM | POA: Diagnosis not present

## 2022-05-19 DIAGNOSIS — Z992 Dependence on renal dialysis: Secondary | ICD-10-CM | POA: Diagnosis not present

## 2022-05-19 DIAGNOSIS — D631 Anemia in chronic kidney disease: Secondary | ICD-10-CM | POA: Diagnosis not present

## 2022-05-19 DIAGNOSIS — N2581 Secondary hyperparathyroidism of renal origin: Secondary | ICD-10-CM | POA: Diagnosis not present

## 2022-05-19 DIAGNOSIS — N186 End stage renal disease: Secondary | ICD-10-CM | POA: Diagnosis not present

## 2022-05-22 DIAGNOSIS — D509 Iron deficiency anemia, unspecified: Secondary | ICD-10-CM | POA: Diagnosis not present

## 2022-05-22 DIAGNOSIS — N250 Renal osteodystrophy: Secondary | ICD-10-CM | POA: Diagnosis not present

## 2022-05-22 DIAGNOSIS — N186 End stage renal disease: Secondary | ICD-10-CM | POA: Diagnosis not present

## 2022-05-22 DIAGNOSIS — D631 Anemia in chronic kidney disease: Secondary | ICD-10-CM | POA: Diagnosis not present

## 2022-05-22 DIAGNOSIS — N2581 Secondary hyperparathyroidism of renal origin: Secondary | ICD-10-CM | POA: Diagnosis not present

## 2022-05-22 DIAGNOSIS — Z992 Dependence on renal dialysis: Secondary | ICD-10-CM | POA: Diagnosis not present

## 2022-05-24 DIAGNOSIS — N2581 Secondary hyperparathyroidism of renal origin: Secondary | ICD-10-CM | POA: Diagnosis not present

## 2022-05-24 DIAGNOSIS — N250 Renal osteodystrophy: Secondary | ICD-10-CM | POA: Diagnosis not present

## 2022-05-24 DIAGNOSIS — D631 Anemia in chronic kidney disease: Secondary | ICD-10-CM | POA: Diagnosis not present

## 2022-05-24 DIAGNOSIS — D509 Iron deficiency anemia, unspecified: Secondary | ICD-10-CM | POA: Diagnosis not present

## 2022-05-24 DIAGNOSIS — Z992 Dependence on renal dialysis: Secondary | ICD-10-CM | POA: Diagnosis not present

## 2022-05-24 DIAGNOSIS — N186 End stage renal disease: Secondary | ICD-10-CM | POA: Diagnosis not present

## 2022-05-25 ENCOUNTER — Ambulatory Visit (INDEPENDENT_AMBULATORY_CARE_PROVIDER_SITE_OTHER): Payer: Medicare Other | Admitting: Nurse Practitioner

## 2022-05-25 ENCOUNTER — Encounter: Payer: Self-pay | Admitting: Nurse Practitioner

## 2022-05-25 VITALS — BP 180/118 | HR 85 | Temp 98.2°F | Wt 256.3 lb

## 2022-05-25 DIAGNOSIS — Z1159 Encounter for screening for other viral diseases: Secondary | ICD-10-CM | POA: Diagnosis not present

## 2022-05-25 DIAGNOSIS — J449 Chronic obstructive pulmonary disease, unspecified: Secondary | ICD-10-CM | POA: Diagnosis not present

## 2022-05-25 DIAGNOSIS — I5032 Chronic diastolic (congestive) heart failure: Secondary | ICD-10-CM | POA: Diagnosis not present

## 2022-05-25 DIAGNOSIS — Z992 Dependence on renal dialysis: Secondary | ICD-10-CM

## 2022-05-25 DIAGNOSIS — E1165 Type 2 diabetes mellitus with hyperglycemia: Secondary | ICD-10-CM

## 2022-05-25 DIAGNOSIS — E1159 Type 2 diabetes mellitus with other circulatory complications: Secondary | ICD-10-CM

## 2022-05-25 DIAGNOSIS — I152 Hypertension secondary to endocrine disorders: Secondary | ICD-10-CM

## 2022-05-25 DIAGNOSIS — N186 End stage renal disease: Secondary | ICD-10-CM | POA: Diagnosis not present

## 2022-05-25 DIAGNOSIS — I70213 Atherosclerosis of native arteries of extremities with intermittent claudication, bilateral legs: Secondary | ICD-10-CM | POA: Diagnosis not present

## 2022-05-25 NOTE — Assessment & Plan Note (Signed)
Chronic.  Followed by Cardiology at [facility]. On Entresto.  Euvolemic today. Continue current regimen. Continue to monitor. Call with any concerns.

## 2022-05-25 NOTE — Assessment & Plan Note (Signed)
Continue with Dialysis.  Continue to follow up with Nephrologist.

## 2022-05-25 NOTE — Assessment & Plan Note (Signed)
Chronic.  Controlled.  Last A1c 7.3.  Managed by Nephrology.  On Tresiba 10u nightly.  Continue with current medication regimen.  Can consider stopping tresiba if A1c is less than 7.  Labs ordered today.  Return to clinic in 3 months for reevaluation.  Call sooner if concerns arise.

## 2022-05-25 NOTE — Assessment & Plan Note (Signed)
Stable. Continue to work with nephrology. Continue dialysis. Call with any concerns.

## 2022-05-25 NOTE — Progress Notes (Signed)
BP (!) 180/118 (BP Location: Right Arm)   Pulse 85   Temp 98.2 F (36.8 C) (Oral)   Wt 256 lb 4.8 oz (116.3 kg)   SpO2 95%   BMI 31.03 kg/m    Subjective:    Patient ID: Timothy Gibson, male    DOB: 1950/12/28, 71 y.o.   MRN: [id number]  HPI: Timothy Gibson is a 71 y.o. male  Chief Complaint  Patient presents with   Hypertension   Hyperlipidemia   Diabetes    3 month follow up    HYPERTENSION Hypertension status: uncontrolled  Satisfied with current treatment? yes Duration of hypertension: years BP monitoring frequency:   checked frequently at dialysis BP range:  BP medication side effects:  no Medication compliance: excellent compliance Previous BP meds: Metoprolol and valsartan Aspirin: no Recurrent headaches: no Visual changes: no Palpitations: no Dyspnea: no Chest pain: no Lower extremity edema: no Dizzy/lightheaded: no  DIABETES Dr Holley Raring is considering taking him off the [place] per patient.  Hypoglycemic episodes:no Polydipsia/polyuria: no Visual disturbance: no Chest pain: no Paresthesias: no Glucose Monitoring: no  Accucheck frequency: Not Checking  Fasting glucose:  Post prandial:  Evening:  Before meals: Taking Insulin?: yes  Long acting insulin: Tresiba 10u  Short acting insulin: Blood Pressure Monitoring:  checked at dialysis Retinal Examination: up to date- [facility] Foot Exam: Not up to Date Diabetic Education: Not Completed Pneumovax: Up to Date Influenza: Up to Date Aspirin: no  DIALYSIS M,W,F.  Patient states he sees Dr. Holley Raring about every two weeks.  He was pleased with his A1c.       Relevant past medical, surgical, family and social history reviewed and updated as indicated. Interim medical history since our last visit reviewed. Allergies and medications reviewed and updated.  Review of Systems  HENT:  Nosebleeds: cmp. Trouble swallowing: c.   Eyes:  Negative for visual disturbance.  Respiratory:  Negative  for chest tightness and shortness of breath.   Cardiovascular:  Negative for chest pain, palpitations and leg swelling.  Endocrine: Negative for polydipsia and polyuria.  Neurological:  Negative for dizziness, light-headedness, numbness and headaches.    Per HPI unless specifically indicated above     Objective:    BP (!) 180/118 (BP Location: Right Arm)   Pulse 85   Temp 98.2 F (36.8 C) (Oral)   Wt 256 lb 4.8 oz (116.3 kg)   SpO2 95%   BMI 31.03 kg/m   Wt Readings from Last 3 Encounters:  05/25/22 256 lb 4.8 oz (116.3 kg)  02/14/22 247 lb 1.6 oz (112.1 kg)  12/02/21 247 lb 3.2 oz (112.1 kg)    Physical Exam Vitals and nursing note reviewed.  Constitutional:      General: He is not in acute distress.    Appearance: Normal appearance. He is not ill-appearing, toxic-appearing or diaphoretic.  HENT:     Head: Normocephalic.     Right Ear: External ear normal.     Left Ear: External ear normal.     Nose: Nose normal. No congestion or rhinorrhea.     Mouth/Throat:     Mouth: Mucous membranes are moist.  Eyes:     General:        Right eye: No discharge.        Left eye: No discharge.     Extraocular Movements: Extraocular movements intact.     Conjunctiva/sclera: Conjunctivae normal.     Pupils: Pupils are equal, round, and reactive to  light.  Cardiovascular:     Rate and Rhythm: Normal rate and regular rhythm.     Heart sounds: No murmur heard. Pulmonary:     Effort: Pulmonary effort is normal. No respiratory distress.     Breath sounds: Normal breath sounds. No wheezing, rhonchi or rales.  Abdominal:     General: Abdomen is flat. Bowel sounds are normal.  Musculoskeletal:     Cervical back: Normal range of motion and neck supple.     Comments: Uses cane  Skin:    General: Skin is warm and dry.     Capillary Refill: Capillary refill takes less than 2 seconds.  Neurological:     General: No focal deficit present.     Mental Status: He is alert and oriented to  person, place, and time.  Psychiatric:        Mood and Affect: Mood normal.        Behavior: Behavior normal.        Thought Content: Thought content normal.        Judgment: Judgment normal.     Results for orders placed or performed in visit on 02/15/22  HM DIABETES EYE EXAM  Result Value Ref Range   HM Diabetic Eye Exam Retinopathy (A) No Retinopathy      Assessment & Plan:   Problem List Items Addressed This Visit       Cardiovascular and Mediastinum   Atherosclerosis of native arteries of extremity with intermittent claudication ([place]) - Primary    Chronic.  Ongoing.  Labs ordered today.  Continue with current medication regimen.  Follow up in 3 months. Call sooner if concerns arise.       Relevant Orders   Comp Met (CMET)   Hypertension associated with diabetes ([place])    Chronic. Not well controlled.  Patient is on dialysis.  Blood pressure is managed by Nephrology and Cardiology.  States his blood pressure is very low when he leaves dialysis.  Continue per their recommendations.  Asymptomatic at visit today. Follow up in 3 months.  Call sooner if concerns arise.      Relevant Orders   Comp Met (CMET)   Chronic diastolic heart failure (HCC)    Chronic.  Followed by Cardiology at [facility]. On Entresto.  Euvolemic today. Continue current regimen. Continue to monitor. Call with any concerns.       Relevant Orders   Comp Met (CMET)     Respiratory   COPD (chronic obstructive pulmonary disease) (HCC)    Chronic. Under good control on current regimen. Continue current regimen of Anoro. Continue to monitor. Call with any concerns. Follow up in 3 months.  Call sooner if concerns arise.         Endocrine   Type 2 diabetes mellitus with hyperglycemia, without long-term current use of insulin (HCC)    Chronic.  Controlled.  Last A1c 7.3.  Managed by Nephrology.  On Tresiba 10u nightly.  Continue with current medication regimen.  Can consider stopping tresiba if A1c is  less than 7.  Labs ordered today.  Return to clinic in 3 months for reevaluation.  Call sooner if concerns arise.        Relevant Orders   HgB A1c     Genitourinary   End stage renal disease ([place])    Continue with Dialysis.  Continue to follow up with Nephrologist.        Other   Dialysis patient ([place])    Stable. Continue to  work with nephrology. Continue dialysis. Call with any concerns.       Other Visit Diagnoses     Need for hepatitis C screening test       Relevant Orders   Hepatitis C Antibody         Follow up plan: Return in about 3 months (around 08/25/2022) for HTN, HLD, DM2 FU.

## 2022-05-25 NOTE — Assessment & Plan Note (Signed)
Chronic. Not well controlled.  Patient is on dialysis.  Blood pressure is managed by Nephrology and Cardiology.  States his blood pressure is very low when he leaves dialysis.  Continue per their recommendations.  Asymptomatic at visit today. Follow up in 3 months.  Call sooner if concerns arise.

## 2022-05-25 NOTE — Assessment & Plan Note (Signed)
Chronic.  Ongoing.  Labs ordered today.  Continue with current medication regimen.  Follow up in 3 months. Call sooner if concerns arise.

## 2022-05-25 NOTE — Assessment & Plan Note (Signed)
Chronic. Under good control on current regimen. Continue current regimen of Anoro. Continue to monitor. Call with any concerns. Follow up in 3 months.  Call sooner if concerns arise.

## 2022-05-26 DIAGNOSIS — N2581 Secondary hyperparathyroidism of renal origin: Secondary | ICD-10-CM | POA: Diagnosis not present

## 2022-05-26 DIAGNOSIS — D509 Iron deficiency anemia, unspecified: Secondary | ICD-10-CM | POA: Diagnosis not present

## 2022-05-26 DIAGNOSIS — D631 Anemia in chronic kidney disease: Secondary | ICD-10-CM | POA: Diagnosis not present

## 2022-05-26 DIAGNOSIS — Z992 Dependence on renal dialysis: Secondary | ICD-10-CM | POA: Diagnosis not present

## 2022-05-26 DIAGNOSIS — N186 End stage renal disease: Secondary | ICD-10-CM | POA: Diagnosis not present

## 2022-05-26 DIAGNOSIS — N250 Renal osteodystrophy: Secondary | ICD-10-CM | POA: Diagnosis not present

## 2022-05-26 LAB — COMPREHENSIVE METABOLIC PANEL
ALT: 7 IU/L (ref 0–44)
AST: 13 IU/L (ref 0–40)
Albumin/Globulin Ratio: 1.3 (ref 1.2–2.2)
Albumin: 3.8 g/dL (ref 3.8–4.8)
Alkaline Phosphatase: 93 IU/L (ref 44–121)
BUN/Creatinine Ratio: 4 — ABNORMAL LOW (ref 10–24)
BUN: 31 mg/dL — ABNORMAL HIGH (ref 8–27)
Bilirubin Total: 0.2 mg/dL (ref 0.0–1.2)
CO2: 30 mmol/L — ABNORMAL HIGH (ref 20–29)
Calcium: 9.3 mg/dL (ref 8.6–10.2)
Chloride: 97 mmol/L (ref 96–106)
Creatinine, Ser: 7.9 mg/dL — ABNORMAL HIGH (ref 0.76–1.27)
Globulin, Total: 2.9 g/dL (ref 1.5–4.5)
Glucose: 268 mg/dL — ABNORMAL HIGH (ref 70–99)
Potassium: 3.6 mmol/L (ref 3.5–5.2)
Sodium: 142 mmol/L (ref 134–144)
Total Protein: 6.7 g/dL (ref 6.0–8.5)
eGFR: 7 mL/min/{1.73_m2} — ABNORMAL LOW (ref >59)

## 2022-05-26 LAB — HEMOGLOBIN A1C
Est. average glucose Bld gHb Est-mCnc: 229 mg/dL
Hgb A1c MFr Bld: 9.6 % — ABNORMAL HIGH (ref 4.8–5.6)

## 2022-05-26 LAB — HEPATITIS C ANTIBODY: Hep C Virus Ab: NONREACTIVE

## 2022-05-26 NOTE — Progress Notes (Signed)
Please let patient know that his A1c increased to 9.6.  We need to increase his insulin to 20u nightly.  He should follow up with Dr. Holley Raring regarding this also.  Otherwise, his blood work is stable from prior.

## 2022-05-28 DIAGNOSIS — D509 Iron deficiency anemia, unspecified: Secondary | ICD-10-CM | POA: Diagnosis not present

## 2022-05-28 DIAGNOSIS — N2581 Secondary hyperparathyroidism of renal origin: Secondary | ICD-10-CM | POA: Diagnosis not present

## 2022-05-28 DIAGNOSIS — Z992 Dependence on renal dialysis: Secondary | ICD-10-CM | POA: Diagnosis not present

## 2022-05-28 DIAGNOSIS — N250 Renal osteodystrophy: Secondary | ICD-10-CM | POA: Diagnosis not present

## 2022-05-28 DIAGNOSIS — N186 End stage renal disease: Secondary | ICD-10-CM | POA: Diagnosis not present

## 2022-05-28 DIAGNOSIS — D631 Anemia in chronic kidney disease: Secondary | ICD-10-CM | POA: Diagnosis not present

## 2022-05-30 DIAGNOSIS — N250 Renal osteodystrophy: Secondary | ICD-10-CM | POA: Diagnosis not present

## 2022-05-30 DIAGNOSIS — Z992 Dependence on renal dialysis: Secondary | ICD-10-CM | POA: Diagnosis not present

## 2022-05-30 DIAGNOSIS — N186 End stage renal disease: Secondary | ICD-10-CM | POA: Diagnosis not present

## 2022-05-30 DIAGNOSIS — D509 Iron deficiency anemia, unspecified: Secondary | ICD-10-CM | POA: Diagnosis not present

## 2022-05-30 DIAGNOSIS — N2581 Secondary hyperparathyroidism of renal origin: Secondary | ICD-10-CM | POA: Diagnosis not present

## 2022-05-30 DIAGNOSIS — D631 Anemia in chronic kidney disease: Secondary | ICD-10-CM | POA: Diagnosis not present

## 2022-06-02 DIAGNOSIS — D509 Iron deficiency anemia, unspecified: Secondary | ICD-10-CM | POA: Diagnosis not present

## 2022-06-02 DIAGNOSIS — D631 Anemia in chronic kidney disease: Secondary | ICD-10-CM | POA: Diagnosis not present

## 2022-06-02 DIAGNOSIS — N2581 Secondary hyperparathyroidism of renal origin: Secondary | ICD-10-CM | POA: Diagnosis not present

## 2022-06-02 DIAGNOSIS — N186 End stage renal disease: Secondary | ICD-10-CM | POA: Diagnosis not present

## 2022-06-02 DIAGNOSIS — Z992 Dependence on renal dialysis: Secondary | ICD-10-CM | POA: Diagnosis not present

## 2022-06-02 DIAGNOSIS — N250 Renal osteodystrophy: Secondary | ICD-10-CM | POA: Diagnosis not present

## 2022-06-05 DIAGNOSIS — Z992 Dependence on renal dialysis: Secondary | ICD-10-CM | POA: Diagnosis not present

## 2022-06-05 DIAGNOSIS — N2581 Secondary hyperparathyroidism of renal origin: Secondary | ICD-10-CM | POA: Diagnosis not present

## 2022-06-05 DIAGNOSIS — D509 Iron deficiency anemia, unspecified: Secondary | ICD-10-CM | POA: Diagnosis not present

## 2022-06-05 DIAGNOSIS — N186 End stage renal disease: Secondary | ICD-10-CM | POA: Diagnosis not present

## 2022-06-05 DIAGNOSIS — N250 Renal osteodystrophy: Secondary | ICD-10-CM | POA: Diagnosis not present

## 2022-06-05 DIAGNOSIS — D631 Anemia in chronic kidney disease: Secondary | ICD-10-CM | POA: Diagnosis not present

## 2022-06-07 DIAGNOSIS — D631 Anemia in chronic kidney disease: Secondary | ICD-10-CM | POA: Diagnosis not present

## 2022-06-07 DIAGNOSIS — D509 Iron deficiency anemia, unspecified: Secondary | ICD-10-CM | POA: Diagnosis not present

## 2022-06-07 DIAGNOSIS — N2581 Secondary hyperparathyroidism of renal origin: Secondary | ICD-10-CM | POA: Diagnosis not present

## 2022-06-07 DIAGNOSIS — Z992 Dependence on renal dialysis: Secondary | ICD-10-CM | POA: Diagnosis not present

## 2022-06-07 DIAGNOSIS — N186 End stage renal disease: Secondary | ICD-10-CM | POA: Diagnosis not present

## 2022-06-07 DIAGNOSIS — N250 Renal osteodystrophy: Secondary | ICD-10-CM | POA: Diagnosis not present

## 2022-06-08 DIAGNOSIS — Z992 Dependence on renal dialysis: Secondary | ICD-10-CM | POA: Diagnosis not present

## 2022-06-08 DIAGNOSIS — N186 End stage renal disease: Secondary | ICD-10-CM | POA: Diagnosis not present

## 2022-06-09 ENCOUNTER — Ambulatory Visit: Payer: Self-pay

## 2022-06-09 DIAGNOSIS — Z992 Dependence on renal dialysis: Secondary | ICD-10-CM | POA: Diagnosis not present

## 2022-06-09 DIAGNOSIS — N250 Renal osteodystrophy: Secondary | ICD-10-CM | POA: Diagnosis not present

## 2022-06-09 DIAGNOSIS — N2581 Secondary hyperparathyroidism of renal origin: Secondary | ICD-10-CM | POA: Diagnosis not present

## 2022-06-09 DIAGNOSIS — N186 End stage renal disease: Secondary | ICD-10-CM | POA: Diagnosis not present

## 2022-06-09 DIAGNOSIS — D509 Iron deficiency anemia, unspecified: Secondary | ICD-10-CM | POA: Diagnosis not present

## 2022-06-09 DIAGNOSIS — D631 Anemia in chronic kidney disease: Secondary | ICD-10-CM | POA: Diagnosis not present

## 2022-06-09 MED ORDER — LEVEMIR FLEXPEN 100 UNIT/ML ~~LOC~~ SOPN: 20.0000 [IU] | PEN_INJECTOR | Freq: Every day | SUBCUTANEOUS | 1 refills | Status: DC

## 2022-06-09 NOTE — Telephone Encounter (Signed)
Levemir sent for patient.

## 2022-06-09 NOTE — Telephone Encounter (Signed)
Patient notified of RX being sent in.

## 2022-06-09 NOTE — Addendum Note (Signed)
Addended by: Jon Billings on: 06/09/2022 11:40 AM   Modules accepted: Orders

## 2022-06-09 NOTE — Telephone Encounter (Signed)
  Chief Complaint: medication refill Symptoms: NA Frequency: been out of medication for 2-3 days  Pertinent Negatives: NA Disposition: '[]'$ ED /'[]'$ Urgent Care (no appt availability in office) / '[]'$ Appointment(In office/virtual)/ '[]'$  [place] [facility]/ '[]'$ Home Care/ '[]'$ Refused Recommended Disposition /'[]'$ [place] [facility]/ '[x]'$  Follow-up with PCP Additional Notes: pt is prescribed Tresiba but Olegario Shearer, pt's friend states that pt has been on levemir for years and has had pens left over since it was prescribed several years ago but now pt is out. She states that pt had never used the [place]. States pt injects 20 Units QHS of levemir. Asked if it could be sent to [facility] for refill. Advised I would send message back to provider.   Reason for Disposition  [1] Prescription refill request for ESSENTIAL medicine (i.e., likelihood of harm to patient if not taken) AND [2] triager unable to refill per department policy  Answer Assessment - Initial Assessment Questions 1. DRUG NAME: "What medicine do you need to have refilled?"     Levemir  2. REFILLS REMAINING: "How many refills are remaining?" (Note: The label on the medicine or pill bottle will show how many refills are remaining. If there are no refills remaining, then a renewal may be needed.)     0 not on med list 4. PRESCRIBING HCP: "Who prescribed it?" Reason: If prescribed by specialist, call should be referred to that group.     Santiago Glad, NP several years ago  5. SYMPTOMS: "Do you have any symptoms?"     Has been without medication for 2-3 days  Protocols used: Medication Refill and Renewal Call-A-AH

## 2022-06-12 DIAGNOSIS — N186 End stage renal disease: Secondary | ICD-10-CM | POA: Diagnosis not present

## 2022-06-12 DIAGNOSIS — Z992 Dependence on renal dialysis: Secondary | ICD-10-CM | POA: Diagnosis not present

## 2022-06-12 DIAGNOSIS — D631 Anemia in chronic kidney disease: Secondary | ICD-10-CM | POA: Diagnosis not present

## 2022-06-12 DIAGNOSIS — N2581 Secondary hyperparathyroidism of renal origin: Secondary | ICD-10-CM | POA: Diagnosis not present

## 2022-06-12 DIAGNOSIS — N250 Renal osteodystrophy: Secondary | ICD-10-CM | POA: Diagnosis not present

## 2022-06-12 DIAGNOSIS — D509 Iron deficiency anemia, unspecified: Secondary | ICD-10-CM | POA: Diagnosis not present

## 2022-06-14 DIAGNOSIS — N2581 Secondary hyperparathyroidism of renal origin: Secondary | ICD-10-CM | POA: Diagnosis not present

## 2022-06-14 DIAGNOSIS — N250 Renal osteodystrophy: Secondary | ICD-10-CM | POA: Diagnosis not present

## 2022-06-14 DIAGNOSIS — D509 Iron deficiency anemia, unspecified: Secondary | ICD-10-CM | POA: Diagnosis not present

## 2022-06-14 DIAGNOSIS — D631 Anemia in chronic kidney disease: Secondary | ICD-10-CM | POA: Diagnosis not present

## 2022-06-14 DIAGNOSIS — N186 End stage renal disease: Secondary | ICD-10-CM | POA: Diagnosis not present

## 2022-06-14 DIAGNOSIS — Z992 Dependence on renal dialysis: Secondary | ICD-10-CM | POA: Diagnosis not present

## 2022-06-16 DIAGNOSIS — D509 Iron deficiency anemia, unspecified: Secondary | ICD-10-CM | POA: Diagnosis not present

## 2022-06-16 DIAGNOSIS — N250 Renal osteodystrophy: Secondary | ICD-10-CM | POA: Diagnosis not present

## 2022-06-16 DIAGNOSIS — D631 Anemia in chronic kidney disease: Secondary | ICD-10-CM | POA: Diagnosis not present

## 2022-06-16 DIAGNOSIS — Z992 Dependence on renal dialysis: Secondary | ICD-10-CM | POA: Diagnosis not present

## 2022-06-16 DIAGNOSIS — N186 End stage renal disease: Secondary | ICD-10-CM | POA: Diagnosis not present

## 2022-06-16 DIAGNOSIS — N2581 Secondary hyperparathyroidism of renal origin: Secondary | ICD-10-CM | POA: Diagnosis not present

## 2022-06-19 DIAGNOSIS — D631 Anemia in chronic kidney disease: Secondary | ICD-10-CM | POA: Diagnosis not present

## 2022-06-19 DIAGNOSIS — N186 End stage renal disease: Secondary | ICD-10-CM | POA: Diagnosis not present

## 2022-06-19 DIAGNOSIS — Z992 Dependence on renal dialysis: Secondary | ICD-10-CM | POA: Diagnosis not present

## 2022-06-19 DIAGNOSIS — D509 Iron deficiency anemia, unspecified: Secondary | ICD-10-CM | POA: Diagnosis not present

## 2022-06-19 DIAGNOSIS — N250 Renal osteodystrophy: Secondary | ICD-10-CM | POA: Diagnosis not present

## 2022-06-19 DIAGNOSIS — N2581 Secondary hyperparathyroidism of renal origin: Secondary | ICD-10-CM | POA: Diagnosis not present

## 2022-06-21 DIAGNOSIS — N186 End stage renal disease: Secondary | ICD-10-CM | POA: Diagnosis not present

## 2022-06-21 DIAGNOSIS — N2581 Secondary hyperparathyroidism of renal origin: Secondary | ICD-10-CM | POA: Diagnosis not present

## 2022-06-21 DIAGNOSIS — N250 Renal osteodystrophy: Secondary | ICD-10-CM | POA: Diagnosis not present

## 2022-06-21 DIAGNOSIS — D509 Iron deficiency anemia, unspecified: Secondary | ICD-10-CM | POA: Diagnosis not present

## 2022-06-21 DIAGNOSIS — Z992 Dependence on renal dialysis: Secondary | ICD-10-CM | POA: Diagnosis not present

## 2022-06-21 DIAGNOSIS — D631 Anemia in chronic kidney disease: Secondary | ICD-10-CM | POA: Diagnosis not present

## 2022-06-23 DIAGNOSIS — D509 Iron deficiency anemia, unspecified: Secondary | ICD-10-CM | POA: Diagnosis not present

## 2022-06-23 DIAGNOSIS — D631 Anemia in chronic kidney disease: Secondary | ICD-10-CM | POA: Diagnosis not present

## 2022-06-23 DIAGNOSIS — Z992 Dependence on renal dialysis: Secondary | ICD-10-CM | POA: Diagnosis not present

## 2022-06-23 DIAGNOSIS — N186 End stage renal disease: Secondary | ICD-10-CM | POA: Diagnosis not present

## 2022-06-23 DIAGNOSIS — N250 Renal osteodystrophy: Secondary | ICD-10-CM | POA: Diagnosis not present

## 2022-06-23 DIAGNOSIS — N2581 Secondary hyperparathyroidism of renal origin: Secondary | ICD-10-CM | POA: Diagnosis not present

## 2022-06-26 DIAGNOSIS — D509 Iron deficiency anemia, unspecified: Secondary | ICD-10-CM | POA: Diagnosis not present

## 2022-06-26 DIAGNOSIS — Z992 Dependence on renal dialysis: Secondary | ICD-10-CM | POA: Diagnosis not present

## 2022-06-26 DIAGNOSIS — N250 Renal osteodystrophy: Secondary | ICD-10-CM | POA: Diagnosis not present

## 2022-06-26 DIAGNOSIS — N186 End stage renal disease: Secondary | ICD-10-CM | POA: Diagnosis not present

## 2022-06-26 DIAGNOSIS — D631 Anemia in chronic kidney disease: Secondary | ICD-10-CM | POA: Diagnosis not present

## 2022-06-26 DIAGNOSIS — N2581 Secondary hyperparathyroidism of renal origin: Secondary | ICD-10-CM | POA: Diagnosis not present

## 2022-06-28 DIAGNOSIS — D509 Iron deficiency anemia, unspecified: Secondary | ICD-10-CM | POA: Diagnosis not present

## 2022-06-28 DIAGNOSIS — N250 Renal osteodystrophy: Secondary | ICD-10-CM | POA: Diagnosis not present

## 2022-06-28 DIAGNOSIS — N2581 Secondary hyperparathyroidism of renal origin: Secondary | ICD-10-CM | POA: Diagnosis not present

## 2022-06-28 DIAGNOSIS — N186 End stage renal disease: Secondary | ICD-10-CM | POA: Diagnosis not present

## 2022-06-28 DIAGNOSIS — D631 Anemia in chronic kidney disease: Secondary | ICD-10-CM | POA: Diagnosis not present

## 2022-06-28 DIAGNOSIS — Z992 Dependence on renal dialysis: Secondary | ICD-10-CM | POA: Diagnosis not present

## 2022-06-30 DIAGNOSIS — D509 Iron deficiency anemia, unspecified: Secondary | ICD-10-CM | POA: Diagnosis not present

## 2022-06-30 DIAGNOSIS — N250 Renal osteodystrophy: Secondary | ICD-10-CM | POA: Diagnosis not present

## 2022-06-30 DIAGNOSIS — N2581 Secondary hyperparathyroidism of renal origin: Secondary | ICD-10-CM | POA: Diagnosis not present

## 2022-06-30 DIAGNOSIS — N186 End stage renal disease: Secondary | ICD-10-CM | POA: Diagnosis not present

## 2022-06-30 DIAGNOSIS — D631 Anemia in chronic kidney disease: Secondary | ICD-10-CM | POA: Diagnosis not present

## 2022-06-30 DIAGNOSIS — Z992 Dependence on renal dialysis: Secondary | ICD-10-CM | POA: Diagnosis not present

## 2022-07-02 DIAGNOSIS — Z992 Dependence on renal dialysis: Secondary | ICD-10-CM | POA: Diagnosis not present

## 2022-07-02 DIAGNOSIS — D631 Anemia in chronic kidney disease: Secondary | ICD-10-CM | POA: Diagnosis not present

## 2022-07-02 DIAGNOSIS — N2581 Secondary hyperparathyroidism of renal origin: Secondary | ICD-10-CM | POA: Diagnosis not present

## 2022-07-02 DIAGNOSIS — N186 End stage renal disease: Secondary | ICD-10-CM | POA: Diagnosis not present

## 2022-07-02 DIAGNOSIS — D509 Iron deficiency anemia, unspecified: Secondary | ICD-10-CM | POA: Diagnosis not present

## 2022-07-02 DIAGNOSIS — N250 Renal osteodystrophy: Secondary | ICD-10-CM | POA: Diagnosis not present

## 2022-07-05 DIAGNOSIS — D631 Anemia in chronic kidney disease: Secondary | ICD-10-CM | POA: Diagnosis not present

## 2022-07-05 DIAGNOSIS — D509 Iron deficiency anemia, unspecified: Secondary | ICD-10-CM | POA: Diagnosis not present

## 2022-07-05 DIAGNOSIS — N250 Renal osteodystrophy: Secondary | ICD-10-CM | POA: Diagnosis not present

## 2022-07-05 DIAGNOSIS — N2581 Secondary hyperparathyroidism of renal origin: Secondary | ICD-10-CM | POA: Diagnosis not present

## 2022-07-05 DIAGNOSIS — N186 End stage renal disease: Secondary | ICD-10-CM | POA: Diagnosis not present

## 2022-07-05 DIAGNOSIS — Z992 Dependence on renal dialysis: Secondary | ICD-10-CM | POA: Diagnosis not present

## 2022-07-07 ENCOUNTER — Telehealth: Payer: Self-pay | Admitting: Nurse Practitioner

## 2022-07-07 DIAGNOSIS — D631 Anemia in chronic kidney disease: Secondary | ICD-10-CM | POA: Diagnosis not present

## 2022-07-07 DIAGNOSIS — D509 Iron deficiency anemia, unspecified: Secondary | ICD-10-CM | POA: Diagnosis not present

## 2022-07-07 DIAGNOSIS — Z992 Dependence on renal dialysis: Secondary | ICD-10-CM | POA: Diagnosis not present

## 2022-07-07 DIAGNOSIS — N250 Renal osteodystrophy: Secondary | ICD-10-CM | POA: Diagnosis not present

## 2022-07-07 DIAGNOSIS — N2581 Secondary hyperparathyroidism of renal origin: Secondary | ICD-10-CM | POA: Diagnosis not present

## 2022-07-07 DIAGNOSIS — N186 End stage renal disease: Secondary | ICD-10-CM | POA: Diagnosis not present

## 2022-07-07 NOTE — Telephone Encounter (Signed)
Dialysis is recommending pt to get on oxygen / please advise pt

## 2022-07-07 NOTE — Telephone Encounter (Signed)
Patient would need an appointment to discuss. Please call and schedule.

## 2022-07-07 NOTE — Telephone Encounter (Signed)
Pt scheduled 1/4

## 2022-07-09 DIAGNOSIS — N186 End stage renal disease: Secondary | ICD-10-CM | POA: Diagnosis not present

## 2022-07-09 DIAGNOSIS — Z992 Dependence on renal dialysis: Secondary | ICD-10-CM | POA: Diagnosis not present

## 2022-07-10 DIAGNOSIS — N2581 Secondary hyperparathyroidism of renal origin: Secondary | ICD-10-CM | POA: Diagnosis not present

## 2022-07-10 DIAGNOSIS — N250 Renal osteodystrophy: Secondary | ICD-10-CM | POA: Diagnosis not present

## 2022-07-10 DIAGNOSIS — D631 Anemia in chronic kidney disease: Secondary | ICD-10-CM | POA: Diagnosis not present

## 2022-07-10 DIAGNOSIS — N186 End stage renal disease: Secondary | ICD-10-CM | POA: Diagnosis not present

## 2022-07-10 DIAGNOSIS — D509 Iron deficiency anemia, unspecified: Secondary | ICD-10-CM | POA: Diagnosis not present

## 2022-07-10 DIAGNOSIS — Z992 Dependence on renal dialysis: Secondary | ICD-10-CM | POA: Diagnosis not present

## 2022-07-12 DIAGNOSIS — N250 Renal osteodystrophy: Secondary | ICD-10-CM | POA: Diagnosis not present

## 2022-07-12 DIAGNOSIS — N186 End stage renal disease: Secondary | ICD-10-CM | POA: Diagnosis not present

## 2022-07-12 DIAGNOSIS — Z992 Dependence on renal dialysis: Secondary | ICD-10-CM | POA: Diagnosis not present

## 2022-07-12 DIAGNOSIS — N2581 Secondary hyperparathyroidism of renal origin: Secondary | ICD-10-CM | POA: Diagnosis not present

## 2022-07-12 DIAGNOSIS — D509 Iron deficiency anemia, unspecified: Secondary | ICD-10-CM | POA: Diagnosis not present

## 2022-07-12 DIAGNOSIS — D631 Anemia in chronic kidney disease: Secondary | ICD-10-CM | POA: Diagnosis not present

## 2022-07-12 NOTE — Progress Notes (Signed)
BP (!) 235/92 (BP Location: Right Arm, Cuff Size: Normal)   Pulse 87   Temp 97.9 F (36.6 C) (Oral)   Wt 240 lb (108.9 kg)   SpO2 92%   BMI 29.06 kg/m    Subjective:    Patient ID: Timothy Gibson, male    DOB: 02-11-1951, 72 y.o.   MRN: [id number]  HPI: Timothy Gibson is a 72 y.o. male  Chief Complaint  Patient presents with   Shortness of Breath   COPD Having SOB from about 2am to 5-6am.  He uses his albuterol at this time but it doesn't help his symptoms.  He is also getting short of breath at the end of dialysis.  States this has been going on for the 3-4 months.  He was given O2 three times and states it helps some but not completely.  When he walks a short distance he is out of breath.  He is not able to walk around a store.    Relevant past medical, surgical, family and social history reviewed and updated as indicated. Interim medical history since our last visit reviewed. Allergies and medications reviewed and updated.  Review of Systems  Respiratory:  Positive for shortness of breath.     Per HPI unless specifically indicated above     Objective:    BP (!) 235/92 (BP Location: Right Arm, Cuff Size: Normal)   Pulse 87   Temp 97.9 F (36.6 C) (Oral)   Wt 240 lb (108.9 kg)   SpO2 92%   BMI 29.06 kg/m   Wt Readings from Last 3 Encounters:  07/13/22 240 lb (108.9 kg)  05/25/22 256 lb 4.8 oz (116.3 kg)  02/14/22 247 lb 1.6 oz (112.1 kg)    Physical Exam Vitals and nursing note reviewed.  Constitutional:      General: He is not in acute distress.    Appearance: Normal appearance. He is not ill-appearing, toxic-appearing or diaphoretic.  HENT:     Head: Normocephalic.     Right Ear: External ear normal.     Left Ear: External ear normal.     Nose: Nose normal. No congestion or rhinorrhea.     Mouth/Throat:     Mouth: Mucous membranes are moist.  Eyes:     General:        Right eye: No discharge.        Left eye: No discharge.     Extraocular  Movements: Extraocular movements intact.     Conjunctiva/sclera: Conjunctivae normal.     Pupils: Pupils are equal, round, and reactive to light.  Cardiovascular:     Rate and Rhythm: Normal rate and regular rhythm.     Heart sounds: No murmur heard. Pulmonary:     Effort: Pulmonary effort is normal. No respiratory distress.     Breath sounds: Normal breath sounds. No wheezing, rhonchi or rales.  Abdominal:     General: Abdomen is flat. Bowel sounds are normal.  Musculoskeletal:     Cervical back: Normal range of motion and neck supple.  Skin:    General: Skin is warm and dry.     Capillary Refill: Capillary refill takes less than 2 seconds.  Neurological:     General: No focal deficit present.     Mental Status: He is alert and oriented to person, place, and time.  Psychiatric:        Mood and Affect: Mood normal.        Behavior: Behavior normal.  Thought Content: Thought content normal.        Judgment: Judgment normal.     Results for orders placed or performed in visit on 05/25/22  Comp Met (CMET)  Result Value Ref Range   Glucose 268 (H) 70 - 99 mg/dL   BUN 31 (H) 8 - 27 mg/dL   Creatinine, Ser 0.98 (H) 0.76 - 1.27 mg/dL   eGFR 7 (L) >11 BJ/YNW/2.95   BUN/Creatinine Ratio 4 (L) 10 - 24   Sodium 142 134 - 144 mmol/L   Potassium 3.6 3.5 - 5.2 mmol/L   Chloride 97 96 - 106 mmol/L   CO2 30 (H) 20 - 29 mmol/L   Calcium 9.3 8.6 - 10.2 mg/dL   Total Protein 6.7 6.0 - 8.5 g/dL   Albumin 3.8 3.8 - 4.8 g/dL   Globulin, Total 2.9 1.5 - 4.5 g/dL   Albumin/Globulin Ratio 1.3 1.2 - 2.2   Bilirubin Total 0.2 0.0 - 1.2 mg/dL   Alkaline Phosphatase 93 44 - 121 IU/L   AST 13 0 - 40 IU/L   ALT 7 0 - 44 IU/L  HgB A1c  Result Value Ref Range   Hgb A1c MFr Bld 9.6 (H) 4.8 - 5.6 %   Est. average glucose Bld gHb Est-mCnc 229 mg/dL  Hepatitis C Antibody  Result Value Ref Range   Hep C Virus Ab Non Reactive Non Reactive      Assessment & Plan:   Problem List Items  Addressed This Visit       Respiratory   COPD (chronic obstructive pulmonary disease) (HCC) - Primary    Chronic. Has had worsened in the last 3-4 months.  Doesn't monitor his oxygen levels.  Will refer to Pulmonology for further evaluation and possible order for Oxygen.      Relevant Orders   Ambulatory referral to Pulmonology     Follow up plan: Return if symptoms worsen or fail to improve.

## 2022-07-13 ENCOUNTER — Ambulatory Visit (INDEPENDENT_AMBULATORY_CARE_PROVIDER_SITE_OTHER): Payer: Medicare Other | Admitting: Nurse Practitioner

## 2022-07-13 ENCOUNTER — Encounter: Payer: Self-pay | Admitting: Nurse Practitioner

## 2022-07-13 VITALS — BP 235/92 | HR 87 | Temp 97.9°F | Wt 240.0 lb

## 2022-07-13 DIAGNOSIS — J449 Chronic obstructive pulmonary disease, unspecified: Secondary | ICD-10-CM | POA: Diagnosis not present

## 2022-07-13 NOTE — Assessment & Plan Note (Signed)
Chronic. Has had worsened in the last 3-4 months.  Doesn't monitor his oxygen levels.  Will refer to Pulmonology for further evaluation and possible order for Oxygen.

## 2022-07-14 DIAGNOSIS — D631 Anemia in chronic kidney disease: Secondary | ICD-10-CM | POA: Diagnosis not present

## 2022-07-14 DIAGNOSIS — N186 End stage renal disease: Secondary | ICD-10-CM | POA: Diagnosis not present

## 2022-07-14 DIAGNOSIS — Z992 Dependence on renal dialysis: Secondary | ICD-10-CM | POA: Diagnosis not present

## 2022-07-14 DIAGNOSIS — D509 Iron deficiency anemia, unspecified: Secondary | ICD-10-CM | POA: Diagnosis not present

## 2022-07-14 DIAGNOSIS — N250 Renal osteodystrophy: Secondary | ICD-10-CM | POA: Diagnosis not present

## 2022-07-14 DIAGNOSIS — N2581 Secondary hyperparathyroidism of renal origin: Secondary | ICD-10-CM | POA: Diagnosis not present

## 2022-07-17 DIAGNOSIS — N250 Renal osteodystrophy: Secondary | ICD-10-CM | POA: Diagnosis not present

## 2022-07-17 DIAGNOSIS — E119 Type 2 diabetes mellitus without complications: Secondary | ICD-10-CM | POA: Diagnosis not present

## 2022-07-17 DIAGNOSIS — N186 End stage renal disease: Secondary | ICD-10-CM | POA: Diagnosis not present

## 2022-07-17 DIAGNOSIS — N2581 Secondary hyperparathyroidism of renal origin: Secondary | ICD-10-CM | POA: Diagnosis not present

## 2022-07-17 DIAGNOSIS — Z992 Dependence on renal dialysis: Secondary | ICD-10-CM | POA: Diagnosis not present

## 2022-07-17 DIAGNOSIS — D509 Iron deficiency anemia, unspecified: Secondary | ICD-10-CM | POA: Diagnosis not present

## 2022-07-17 DIAGNOSIS — D631 Anemia in chronic kidney disease: Secondary | ICD-10-CM | POA: Diagnosis not present

## 2022-07-19 DIAGNOSIS — N250 Renal osteodystrophy: Secondary | ICD-10-CM | POA: Diagnosis not present

## 2022-07-19 DIAGNOSIS — D631 Anemia in chronic kidney disease: Secondary | ICD-10-CM | POA: Diagnosis not present

## 2022-07-19 DIAGNOSIS — N186 End stage renal disease: Secondary | ICD-10-CM | POA: Diagnosis not present

## 2022-07-19 DIAGNOSIS — N2581 Secondary hyperparathyroidism of renal origin: Secondary | ICD-10-CM | POA: Diagnosis not present

## 2022-07-19 DIAGNOSIS — D509 Iron deficiency anemia, unspecified: Secondary | ICD-10-CM | POA: Diagnosis not present

## 2022-07-19 DIAGNOSIS — Z992 Dependence on renal dialysis: Secondary | ICD-10-CM | POA: Diagnosis not present

## 2022-07-21 DIAGNOSIS — N250 Renal osteodystrophy: Secondary | ICD-10-CM | POA: Diagnosis not present

## 2022-07-21 DIAGNOSIS — Z992 Dependence on renal dialysis: Secondary | ICD-10-CM | POA: Diagnosis not present

## 2022-07-21 DIAGNOSIS — N2581 Secondary hyperparathyroidism of renal origin: Secondary | ICD-10-CM | POA: Diagnosis not present

## 2022-07-21 DIAGNOSIS — D631 Anemia in chronic kidney disease: Secondary | ICD-10-CM | POA: Diagnosis not present

## 2022-07-21 DIAGNOSIS — N186 End stage renal disease: Secondary | ICD-10-CM | POA: Diagnosis not present

## 2022-07-21 DIAGNOSIS — D509 Iron deficiency anemia, unspecified: Secondary | ICD-10-CM | POA: Diagnosis not present

## 2022-07-24 DIAGNOSIS — N2581 Secondary hyperparathyroidism of renal origin: Secondary | ICD-10-CM | POA: Diagnosis not present

## 2022-07-24 DIAGNOSIS — N250 Renal osteodystrophy: Secondary | ICD-10-CM | POA: Diagnosis not present

## 2022-07-24 DIAGNOSIS — Z992 Dependence on renal dialysis: Secondary | ICD-10-CM | POA: Diagnosis not present

## 2022-07-24 DIAGNOSIS — D631 Anemia in chronic kidney disease: Secondary | ICD-10-CM | POA: Diagnosis not present

## 2022-07-24 DIAGNOSIS — D509 Iron deficiency anemia, unspecified: Secondary | ICD-10-CM | POA: Diagnosis not present

## 2022-07-24 DIAGNOSIS — N186 End stage renal disease: Secondary | ICD-10-CM | POA: Diagnosis not present

## 2022-07-26 DIAGNOSIS — N2581 Secondary hyperparathyroidism of renal origin: Secondary | ICD-10-CM | POA: Diagnosis not present

## 2022-07-26 DIAGNOSIS — D631 Anemia in chronic kidney disease: Secondary | ICD-10-CM | POA: Diagnosis not present

## 2022-07-26 DIAGNOSIS — N186 End stage renal disease: Secondary | ICD-10-CM | POA: Diagnosis not present

## 2022-07-26 DIAGNOSIS — Z992 Dependence on renal dialysis: Secondary | ICD-10-CM | POA: Diagnosis not present

## 2022-07-26 DIAGNOSIS — D509 Iron deficiency anemia, unspecified: Secondary | ICD-10-CM | POA: Diagnosis not present

## 2022-07-26 DIAGNOSIS — N250 Renal osteodystrophy: Secondary | ICD-10-CM | POA: Diagnosis not present

## 2022-07-28 DIAGNOSIS — Z992 Dependence on renal dialysis: Secondary | ICD-10-CM | POA: Diagnosis not present

## 2022-07-28 DIAGNOSIS — N250 Renal osteodystrophy: Secondary | ICD-10-CM | POA: Diagnosis not present

## 2022-07-28 DIAGNOSIS — D509 Iron deficiency anemia, unspecified: Secondary | ICD-10-CM | POA: Diagnosis not present

## 2022-07-28 DIAGNOSIS — N2581 Secondary hyperparathyroidism of renal origin: Secondary | ICD-10-CM | POA: Diagnosis not present

## 2022-07-28 DIAGNOSIS — N186 End stage renal disease: Secondary | ICD-10-CM | POA: Diagnosis not present

## 2022-07-28 DIAGNOSIS — D631 Anemia in chronic kidney disease: Secondary | ICD-10-CM | POA: Diagnosis not present

## 2022-07-31 DIAGNOSIS — D631 Anemia in chronic kidney disease: Secondary | ICD-10-CM | POA: Diagnosis not present

## 2022-07-31 DIAGNOSIS — N2581 Secondary hyperparathyroidism of renal origin: Secondary | ICD-10-CM | POA: Diagnosis not present

## 2022-07-31 DIAGNOSIS — Z992 Dependence on renal dialysis: Secondary | ICD-10-CM | POA: Diagnosis not present

## 2022-07-31 DIAGNOSIS — D509 Iron deficiency anemia, unspecified: Secondary | ICD-10-CM | POA: Diagnosis not present

## 2022-07-31 DIAGNOSIS — N186 End stage renal disease: Secondary | ICD-10-CM | POA: Diagnosis not present

## 2022-07-31 DIAGNOSIS — N250 Renal osteodystrophy: Secondary | ICD-10-CM | POA: Diagnosis not present

## 2022-08-02 DIAGNOSIS — D509 Iron deficiency anemia, unspecified: Secondary | ICD-10-CM | POA: Diagnosis not present

## 2022-08-02 DIAGNOSIS — N186 End stage renal disease: Secondary | ICD-10-CM | POA: Diagnosis not present

## 2022-08-02 DIAGNOSIS — N250 Renal osteodystrophy: Secondary | ICD-10-CM | POA: Diagnosis not present

## 2022-08-02 DIAGNOSIS — Z992 Dependence on renal dialysis: Secondary | ICD-10-CM | POA: Diagnosis not present

## 2022-08-02 DIAGNOSIS — D631 Anemia in chronic kidney disease: Secondary | ICD-10-CM | POA: Diagnosis not present

## 2022-08-02 DIAGNOSIS — N2581 Secondary hyperparathyroidism of renal origin: Secondary | ICD-10-CM | POA: Diagnosis not present

## 2022-08-04 DIAGNOSIS — D631 Anemia in chronic kidney disease: Secondary | ICD-10-CM | POA: Diagnosis not present

## 2022-08-04 DIAGNOSIS — N186 End stage renal disease: Secondary | ICD-10-CM | POA: Diagnosis not present

## 2022-08-04 DIAGNOSIS — N250 Renal osteodystrophy: Secondary | ICD-10-CM | POA: Diagnosis not present

## 2022-08-04 DIAGNOSIS — N2581 Secondary hyperparathyroidism of renal origin: Secondary | ICD-10-CM | POA: Diagnosis not present

## 2022-08-04 DIAGNOSIS — D509 Iron deficiency anemia, unspecified: Secondary | ICD-10-CM | POA: Diagnosis not present

## 2022-08-04 DIAGNOSIS — Z992 Dependence on renal dialysis: Secondary | ICD-10-CM | POA: Diagnosis not present

## 2022-08-07 DIAGNOSIS — D509 Iron deficiency anemia, unspecified: Secondary | ICD-10-CM | POA: Diagnosis not present

## 2022-08-07 DIAGNOSIS — N250 Renal osteodystrophy: Secondary | ICD-10-CM | POA: Diagnosis not present

## 2022-08-07 DIAGNOSIS — Z992 Dependence on renal dialysis: Secondary | ICD-10-CM | POA: Diagnosis not present

## 2022-08-07 DIAGNOSIS — D631 Anemia in chronic kidney disease: Secondary | ICD-10-CM | POA: Diagnosis not present

## 2022-08-07 DIAGNOSIS — N186 End stage renal disease: Secondary | ICD-10-CM | POA: Diagnosis not present

## 2022-08-07 DIAGNOSIS — N2581 Secondary hyperparathyroidism of renal origin: Secondary | ICD-10-CM | POA: Diagnosis not present

## 2022-08-09 ENCOUNTER — Institutional Professional Consult (permissible substitution): Payer: Medicare Other | Admitting: Pulmonary Disease

## 2022-08-09 DIAGNOSIS — N2581 Secondary hyperparathyroidism of renal origin: Secondary | ICD-10-CM | POA: Diagnosis not present

## 2022-08-09 DIAGNOSIS — N250 Renal osteodystrophy: Secondary | ICD-10-CM | POA: Diagnosis not present

## 2022-08-09 DIAGNOSIS — N186 End stage renal disease: Secondary | ICD-10-CM | POA: Diagnosis not present

## 2022-08-09 DIAGNOSIS — D631 Anemia in chronic kidney disease: Secondary | ICD-10-CM | POA: Diagnosis not present

## 2022-08-09 DIAGNOSIS — D509 Iron deficiency anemia, unspecified: Secondary | ICD-10-CM | POA: Diagnosis not present

## 2022-08-09 DIAGNOSIS — Z992 Dependence on renal dialysis: Secondary | ICD-10-CM | POA: Diagnosis not present

## 2022-08-12 DIAGNOSIS — N2581 Secondary hyperparathyroidism of renal origin: Secondary | ICD-10-CM | POA: Diagnosis not present

## 2022-08-12 DIAGNOSIS — Z23 Encounter for immunization: Secondary | ICD-10-CM | POA: Diagnosis not present

## 2022-08-12 DIAGNOSIS — Z992 Dependence on renal dialysis: Secondary | ICD-10-CM | POA: Diagnosis not present

## 2022-08-12 DIAGNOSIS — N186 End stage renal disease: Secondary | ICD-10-CM | POA: Diagnosis not present

## 2022-08-12 DIAGNOSIS — D631 Anemia in chronic kidney disease: Secondary | ICD-10-CM | POA: Diagnosis not present

## 2022-08-12 DIAGNOSIS — N250 Renal osteodystrophy: Secondary | ICD-10-CM | POA: Diagnosis not present

## 2022-08-12 DIAGNOSIS — D509 Iron deficiency anemia, unspecified: Secondary | ICD-10-CM | POA: Diagnosis not present

## 2022-08-14 DIAGNOSIS — Z23 Encounter for immunization: Secondary | ICD-10-CM | POA: Diagnosis not present

## 2022-08-14 DIAGNOSIS — N2581 Secondary hyperparathyroidism of renal origin: Secondary | ICD-10-CM | POA: Diagnosis not present

## 2022-08-14 DIAGNOSIS — D509 Iron deficiency anemia, unspecified: Secondary | ICD-10-CM | POA: Diagnosis not present

## 2022-08-14 DIAGNOSIS — Z992 Dependence on renal dialysis: Secondary | ICD-10-CM | POA: Diagnosis not present

## 2022-08-14 DIAGNOSIS — D631 Anemia in chronic kidney disease: Secondary | ICD-10-CM | POA: Diagnosis not present

## 2022-08-14 DIAGNOSIS — N186 End stage renal disease: Secondary | ICD-10-CM | POA: Diagnosis not present

## 2022-08-15 DIAGNOSIS — Z87891 Personal history of nicotine dependence: Secondary | ICD-10-CM | POA: Diagnosis not present

## 2022-08-15 DIAGNOSIS — D631 Anemia in chronic kidney disease: Secondary | ICD-10-CM | POA: Diagnosis not present

## 2022-08-15 DIAGNOSIS — Z794 Long term (current) use of insulin: Secondary | ICD-10-CM | POA: Diagnosis not present

## 2022-08-15 DIAGNOSIS — N186 End stage renal disease: Secondary | ICD-10-CM | POA: Diagnosis not present

## 2022-08-15 DIAGNOSIS — I120 Hypertensive chronic kidney disease with stage 5 chronic kidney disease or end stage renal disease: Secondary | ICD-10-CM | POA: Diagnosis not present

## 2022-08-15 DIAGNOSIS — I871 Compression of vein: Secondary | ICD-10-CM | POA: Diagnosis not present

## 2022-08-15 DIAGNOSIS — T82856A Stenosis of peripheral vascular stent, initial encounter: Secondary | ICD-10-CM | POA: Diagnosis not present

## 2022-08-15 DIAGNOSIS — T82898A Other specified complication of vascular prosthetic devices, implants and grafts, initial encounter: Secondary | ICD-10-CM | POA: Diagnosis not present

## 2022-08-15 DIAGNOSIS — E1122 Type 2 diabetes mellitus with diabetic chronic kidney disease: Secondary | ICD-10-CM | POA: Diagnosis not present

## 2022-08-15 DIAGNOSIS — Z992 Dependence on renal dialysis: Secondary | ICD-10-CM | POA: Diagnosis not present

## 2022-08-16 DIAGNOSIS — Z23 Encounter for immunization: Secondary | ICD-10-CM | POA: Diagnosis not present

## 2022-08-16 DIAGNOSIS — D509 Iron deficiency anemia, unspecified: Secondary | ICD-10-CM | POA: Diagnosis not present

## 2022-08-16 DIAGNOSIS — N2581 Secondary hyperparathyroidism of renal origin: Secondary | ICD-10-CM | POA: Diagnosis not present

## 2022-08-16 DIAGNOSIS — Z992 Dependence on renal dialysis: Secondary | ICD-10-CM | POA: Diagnosis not present

## 2022-08-16 DIAGNOSIS — N186 End stage renal disease: Secondary | ICD-10-CM | POA: Diagnosis not present

## 2022-08-16 DIAGNOSIS — D631 Anemia in chronic kidney disease: Secondary | ICD-10-CM | POA: Diagnosis not present

## 2022-08-18 DIAGNOSIS — N186 End stage renal disease: Secondary | ICD-10-CM | POA: Diagnosis not present

## 2022-08-18 DIAGNOSIS — Z23 Encounter for immunization: Secondary | ICD-10-CM | POA: Diagnosis not present

## 2022-08-18 DIAGNOSIS — N2581 Secondary hyperparathyroidism of renal origin: Secondary | ICD-10-CM | POA: Diagnosis not present

## 2022-08-18 DIAGNOSIS — Z992 Dependence on renal dialysis: Secondary | ICD-10-CM | POA: Diagnosis not present

## 2022-08-18 DIAGNOSIS — D509 Iron deficiency anemia, unspecified: Secondary | ICD-10-CM | POA: Diagnosis not present

## 2022-08-18 DIAGNOSIS — D631 Anemia in chronic kidney disease: Secondary | ICD-10-CM | POA: Diagnosis not present

## 2022-08-21 ENCOUNTER — Telehealth: Payer: Self-pay

## 2022-08-21 DIAGNOSIS — D631 Anemia in chronic kidney disease: Secondary | ICD-10-CM | POA: Diagnosis not present

## 2022-08-21 DIAGNOSIS — Z992 Dependence on renal dialysis: Secondary | ICD-10-CM | POA: Diagnosis not present

## 2022-08-21 DIAGNOSIS — N2581 Secondary hyperparathyroidism of renal origin: Secondary | ICD-10-CM | POA: Diagnosis not present

## 2022-08-21 DIAGNOSIS — D509 Iron deficiency anemia, unspecified: Secondary | ICD-10-CM | POA: Diagnosis not present

## 2022-08-21 DIAGNOSIS — N186 End stage renal disease: Secondary | ICD-10-CM | POA: Diagnosis not present

## 2022-08-21 DIAGNOSIS — Z23 Encounter for immunization: Secondary | ICD-10-CM | POA: Diagnosis not present

## 2022-08-21 MED ORDER — BASAGLAR KWIKPEN 100 UNIT/ML ~~LOC~~ SOPN: 20.0000 [IU] | PEN_INJECTOR | Freq: Every day | SUBCUTANEOUS | 1 refills | Status: DC

## 2022-08-21 NOTE — Telephone Encounter (Signed)
Medication changed to basaglar.

## 2022-08-21 NOTE — Telephone Encounter (Signed)
Received a fax from [facility] regarding the patients Levemir is not on formulary. Alternative medications are Basaglar, Tyler Aas, or Toujeo. Would any of these be appropriate for the patient?

## 2022-08-22 ENCOUNTER — Other Ambulatory Visit: Payer: Self-pay

## 2022-08-22 MED ORDER — METOPROLOL SUCCINATE ER 25 MG PO TB24: 25.0000 mg | ORAL_TABLET | Freq: Every day | ORAL | 1 refills | Status: DC

## 2022-08-22 NOTE — Telephone Encounter (Signed)
Called and notified patient of medication change.

## 2022-08-23 DIAGNOSIS — N186 End stage renal disease: Secondary | ICD-10-CM | POA: Diagnosis not present

## 2022-08-23 DIAGNOSIS — Z23 Encounter for immunization: Secondary | ICD-10-CM | POA: Diagnosis not present

## 2022-08-23 DIAGNOSIS — N2581 Secondary hyperparathyroidism of renal origin: Secondary | ICD-10-CM | POA: Diagnosis not present

## 2022-08-23 DIAGNOSIS — Z992 Dependence on renal dialysis: Secondary | ICD-10-CM | POA: Diagnosis not present

## 2022-08-23 DIAGNOSIS — D509 Iron deficiency anemia, unspecified: Secondary | ICD-10-CM | POA: Diagnosis not present

## 2022-08-23 DIAGNOSIS — D631 Anemia in chronic kidney disease: Secondary | ICD-10-CM | POA: Diagnosis not present

## 2022-08-24 ENCOUNTER — Encounter: Payer: Self-pay | Admitting: Pulmonary Disease

## 2022-08-24 ENCOUNTER — Ambulatory Visit (INDEPENDENT_AMBULATORY_CARE_PROVIDER_SITE_OTHER): Payer: Medicare Other | Admitting: Pulmonary Disease

## 2022-08-24 ENCOUNTER — Ambulatory Visit (INDEPENDENT_AMBULATORY_CARE_PROVIDER_SITE_OTHER): Payer: Medicare Other

## 2022-08-24 VITALS — BP 146/88 | HR 93 | Ht 76.0 in | Wt 262.4 lb

## 2022-08-24 DIAGNOSIS — I5032 Chronic diastolic (congestive) heart failure: Secondary | ICD-10-CM | POA: Diagnosis not present

## 2022-08-24 DIAGNOSIS — J849 Interstitial pulmonary disease, unspecified: Secondary | ICD-10-CM

## 2022-08-24 DIAGNOSIS — J449 Chronic obstructive pulmonary disease, unspecified: Secondary | ICD-10-CM

## 2022-08-24 DIAGNOSIS — R0602 Shortness of breath: Secondary | ICD-10-CM | POA: Diagnosis not present

## 2022-08-24 MED ORDER — TRELEGY ELLIPTA 100-62.5-25 MCG/ACT IN AEPB: 1.0000 | INHALATION_SPRAY | Freq: Every day | RESPIRATORY_TRACT | 0 refills | Status: DC

## 2022-08-24 NOTE — Progress Notes (Signed)
Synopsis: Referred in February 2024 for shortness of breath by Jon Billings, NP  Subjective:   PATIENT ID: Timothy Gibson GENDER: male DOB: 12-07-1950, MRN: [id number]   HPI  Chief Complaint  Patient presents with   Consult    SOB during exertion   Timothy Gibson is a 72 year old male, former cigar smoker with history of DMII, ESRD on HD, HFrEF and hypertension who is referred to pulmonary clinic for shortness of breath.  He reports having shortness of breath about 7 months ago which has significantly progressed over the past 2-3 months. He is unable to walk around the store due to dyspnea. He has dyspnea walking around his home. He has started using oxygen towards the end of his dialysis sessions due to shortness of breath and not because they have checked his oxygen level. He feels the oxygen helps sometimes and other times it does not help. He is now sleeping in a recliner. He has cough and throat congestion. He feels he has sinus congestion with drainage.   He is using anoro ellipta 1 puff daily and as needed albuterol. He reports the albuterol will occasionally provide relief.   He is a retired Engineer, structural. He also worked at [employer] for 11 years and at [employer] in [place], [place] for 13 years.   Past Medical History:  Diagnosis Date   Cancer ([place])    right kidney   Diabetes mellitus without complication ([place])    Dialysis patient ([place])    on Mon-Wed-Fri dialysis   ESRD (end stage renal disease) ([place])    History of gangrene    Hypertension      Family History  Problem Relation Age of Onset   Hypertension Father    Colon cancer Father    Hypertension Mother    Parkinson's disease Mother    Diabetes Mother    Alzheimer's disease Mother      Social History   Socioeconomic History   Marital status: Divorced    Spouse name: Not on file   Number of children: 4   Years of education: Not on file   Highest education level: Not  on file  Occupational History   Occupation: retired    Comment: forensics  Tobacco Use   Smoking status: Former    Types: Cigars   Smokeless tobacco: Former   Tobacco comments:    cigar smoker-hasnt smoked in 5 mos.   Vaping Use   Vaping Use: Never used  Substance and Sexual Activity   Alcohol use: Not Currently   Drug use: Never   Sexual activity: Not Currently  Other Topics Concern   Not on file  Social History Narrative   Independent, lives with Ms. Jennefer Bravo and her spouse at home.   Social Determinants of Health   Financial Resource Strain: Low Risk  (02/02/2022)   Overall Financial Resource Strain (CARDIA)    Difficulty of Paying Living Expenses: Not hard at all  Food Insecurity: No Food Insecurity (02/02/2022)   Hunger Vital Sign    Worried About Running Out of Food in the Last Year: Never true    Ran Out of Food in the Last Year: Never true  Transportation Needs: No Transportation Needs (02/02/2022)   PRAPARE - Hydrologist (Medical): No    Lack of Transportation (Non-Medical): No  Physical Activity: Inactive (02/02/2022)   Exercise Vital Sign    Days of Exercise per Week: 0 days  Minutes of Exercise per Session: 0 min  Stress: No Stress Concern Present (02/02/2022)   [place]    Feeling of Stress : Not at all  Social Connections: Socially Isolated (02/02/2022)   Social Connection and Isolation Panel [NHANES]    Frequency of Communication with Friends and Family: Never    Frequency of Social Gatherings with Friends and Family: Never    Attends Religious Services: Never    Marine scientist or Organizations: No    Attends Archivist Meetings: Never    Marital Status: Divorced  Human resources officer Violence: Not At Risk (02/02/2022)   Humiliation, Afraid, Rape, and Kick questionnaire    Fear of Current or Ex-Partner: No    Emotionally Abused: No     Physically Abused: No    Sexually Abused: No     Allergies  Allergen Reactions   Adhesive [Tape]     Pulls skin off.  Must use paper tape     Outpatient Medications Prior to Visit  Medication Sig Dispense Refill   albuterol (VENTOLIN HFA) 108 (90 Base) MCG/ACT inhaler Inhale 2 puffs into the lungs every 6 (six) hours as needed for wheezing or shortness of breath. 54 g 1   ANORO ELLIPTA 62.5-25 MCG/ACT AEPB INHALE 1 PUFF INTO THE LUNGS DAILY 180 each 1   Insulin Glargine (BASAGLAR KWIKPEN) 100 UNIT/ML Inject 20 Units into the skin at bedtime. 15 mL 1   sacubitril-valsartan (ENTRESTO) 24-26 MG Take 1 tablet by mouth every 12 (twelve) hours.     clotrimazole-betamethasone (LOTRISONE) cream Apply 1 Application topically daily. (Patient not taking: Reported on 08/24/2022) 30 g 1   lidocaine-prilocaine (EMLA) cream Apply 1 Application topically. (Patient not taking: Reported on 08/24/2022)     metoprolol succinate (TOPROL-XL) 25 MG 24 hr tablet Take 1 tablet (25 mg total) by mouth daily. (Patient not taking: Reported on 08/24/2022) 90 tablet 1   sevelamer carbonate (RENVELA) 800 MG tablet Take 2,400-3,200 mg by mouth 3 (three) times daily with meals. (Patient not taking: Reported on 08/24/2022)     urea (CARMOL) 40 % ointment Apply 1 application. topically daily as needed (Dead skin). (Patient not taking: Reported on 08/24/2022)     No facility-administered medications prior to visit.   Review of Systems  Constitutional:  Negative for chills, fever, malaise/fatigue and weight loss.  HENT:  Positive for congestion. Negative for sinus pain and sore throat.   Eyes: Negative.   Respiratory:  Positive for cough and shortness of breath. Negative for hemoptysis, sputum production and wheezing.   Cardiovascular:  Negative for chest pain, palpitations, orthopnea, claudication and leg swelling.  Gastrointestinal:  Negative for abdominal pain, heartburn, nausea and vomiting.  Genitourinary: Negative.    Musculoskeletal:  Negative for joint pain and myalgias.  Skin:  Negative for rash.  Neurological:  Negative for weakness.  Endo/Heme/Allergies: Negative.   Psychiatric/Behavioral: Negative.     Objective:   Vitals:   08/24/22 1425  BP: (!) 146/88  Pulse: 93  SpO2: 96%  Weight: 262 lb 6.4 oz (119 kg)  Height: 6' 4"$  (1.93 m)     Physical Exam Constitutional:      General: He is not in acute distress. HENT:     Head: Normocephalic and atraumatic.  Eyes:     Extraocular Movements: Extraocular movements intact.     Conjunctiva/sclera: Conjunctivae normal.     Pupils: Pupils are equal, round, and reactive to light.  Cardiovascular:  Rate and Rhythm: Normal rate and regular rhythm.     Pulses: Normal pulses.     Heart sounds: Normal heart sounds. No murmur heard. Pulmonary:     Effort: Pulmonary effort is normal.     Breath sounds: Normal breath sounds.  Abdominal:     General: Bowel sounds are normal.     Palpations: Abdomen is soft.  Musculoskeletal:     Right lower leg: Edema present.     Left lower leg: Edema present.  Lymphadenopathy:     Cervical: No cervical adenopathy.  Skin:    General: Skin is warm and dry.  Neurological:     General: No focal deficit present.     Mental Status: He is alert.  Psychiatric:        Mood and Affect: Mood normal.        Behavior: Behavior normal.        Thought Content: Thought content normal.        Judgment: Judgment normal.    CBC    Component Value Date/Time   WBC 3.4 11/17/2021 0831   WBC 2.8 (L) 05/23/2017 0008   RBC 3.74 (L) 11/17/2021 0831   RBC 3.57 (L) 05/23/2017 0008   HGB 12.1 (L) 11/17/2021 0831   HCT 36.2 (L) 11/17/2021 0831   PLT 101 (L) 11/17/2021 0831   MCV 97 11/17/2021 0831   MCV 93 08/13/2011 1058   MCH 32.4 11/17/2021 0831   MCH 29.7 05/23/2017 0008   MCHC 33.4 11/17/2021 0831   MCHC 32.2 05/23/2017 0008   RDW 13.9 11/17/2021 0831   RDW 13.9 08/13/2011 1058   LYMPHSABS 0.9 11/17/2021 0831    MONOABS 0.3 03/20/2017 1709   EOSABS 0.3 11/17/2021 0831   BASOSABS 0.0 11/17/2021 0831      Latest Ref Rng & Units 05/25/2022    2:13 PM 11/10/2021   10:22 AM 04/28/2021   12:01 PM  BMP  Glucose 70 - 99 mg/dL 268  93  89   BUN 8 - 27 mg/dL 31  31  23   $ Creatinine 0.76 - 1.27 mg/dL 7.90  7.90  7.59   BUN/Creat Ratio 10 - 24 4  4  3   $ Sodium 134 - 144 mmol/L 142  144  145   Potassium 3.5 - 5.2 mmol/L 3.6  3.9  3.8   Chloride 96 - 106 mmol/L 97  95  98   CO2 20 - 29 mmol/L 30  29  30   $ Calcium 8.6 - 10.2 mg/dL 9.3  8.7  9.1    Chest imaging: CXR 10/28/20 Stable cardiomegaly with mild, stable bibasilar linear scarring and/or atelectasis.  PFT:     No data to display          Labs:  Path:  Echo : LV EF 40-45%. Mild mitral valve regurgitation. LA mildly dilated.   Heart Catheterization:  Assessment & Plan:   Chronic obstructive pulmonary disease, unspecified COPD type ([place]) - Plan: Pulmonary Function Test, DG Chest 2 View  Chronic diastolic heart failure ([place]) - Plan: ECHOCARDIOGRAM COMPLETE  Discussion: Timothy Gibson is a 72 year old male, former cigar smoker with history of DMII, ESRD on HD, HFrEF and hypertension who is referred to pulmonary clinic for shortness of breath.  Chest radiograph today shows elevated right hemidiaphragm compared to prior film in 2022. There is increased interstitial markings. We will obtain a HRCT Chest for further evaluation.   He has history of HFpEF and increased lower extremity edema.  We will check an echocardiogram.   We will try him on trelegy ellipta 1 puff daily and he is to stop the anoro. He can continue albuterol inhaler 1-2 puffs as needed.  He lives near [facility] which is more convenient for him to be seen there. He is to follow up with Dr. Genia Harold in 1 month with pulmonary function tests.  Freda Jackson, MD [facility] Office: [phone]    Current Outpatient Medications:    albuterol  (VENTOLIN HFA) 108 (90 Base) MCG/ACT inhaler, Inhale 2 puffs into the lungs every 6 (six) hours as needed for wheezing or shortness of breath., Disp: 54 g, Rfl: 1   ANORO ELLIPTA 62.5-25 MCG/ACT AEPB, INHALE 1 PUFF INTO THE LUNGS DAILY, Disp: 180 each, Rfl: 1   Insulin Glargine (BASAGLAR KWIKPEN) 100 UNIT/ML, Inject 20 Units into the skin at bedtime., Disp: 15 mL, Rfl: 1   sacubitril-valsartan (ENTRESTO) 24-26 MG, Take 1 tablet by mouth every 12 (twelve) hours., Disp: , Rfl:    clotrimazole-betamethasone (LOTRISONE) cream, Apply 1 Application topically daily. (Patient not taking: Reported on 08/24/2022), Disp: 30 g, Rfl: 1   lidocaine-prilocaine (EMLA) cream, Apply 1 Application topically. (Patient not taking: Reported on 08/24/2022), Disp: , Rfl:    metoprolol succinate (TOPROL-XL) 25 MG 24 hr tablet, Take 1 tablet (25 mg total) by mouth daily. (Patient not taking: Reported on 08/24/2022), Disp: 90 tablet, Rfl: 1   sevelamer carbonate (RENVELA) 800 MG tablet, Take 2,400-3,200 mg by mouth 3 (three) times daily with meals. (Patient not taking: Reported on 08/24/2022), Disp: , Rfl:    urea (CARMOL) 40 % ointment, Apply 1 application. topically daily as needed (Dead skin). (Patient not taking: Reported on 08/24/2022), Disp: , Rfl:

## 2022-08-24 NOTE — Patient Instructions (Addendum)
Try trelegy ellipta 1 puff daily - rinse mouth out after each use  Continue to use albuterol 1-2 puffs every 4-6 hours as needed  We will check an echocardiogram  Follow up in 1 month with Dr. Genia Harold at [facility] with pulmonary function tests

## 2022-08-25 ENCOUNTER — Other Ambulatory Visit: Payer: Self-pay

## 2022-08-25 DIAGNOSIS — Z23 Encounter for immunization: Secondary | ICD-10-CM | POA: Diagnosis not present

## 2022-08-25 DIAGNOSIS — N186 End stage renal disease: Secondary | ICD-10-CM | POA: Diagnosis not present

## 2022-08-25 DIAGNOSIS — N2581 Secondary hyperparathyroidism of renal origin: Secondary | ICD-10-CM | POA: Diagnosis not present

## 2022-08-25 DIAGNOSIS — Z992 Dependence on renal dialysis: Secondary | ICD-10-CM | POA: Diagnosis not present

## 2022-08-25 DIAGNOSIS — D631 Anemia in chronic kidney disease: Secondary | ICD-10-CM | POA: Diagnosis not present

## 2022-08-25 DIAGNOSIS — J449 Chronic obstructive pulmonary disease, unspecified: Secondary | ICD-10-CM

## 2022-08-25 DIAGNOSIS — D509 Iron deficiency anemia, unspecified: Secondary | ICD-10-CM | POA: Diagnosis not present

## 2022-08-28 DIAGNOSIS — D631 Anemia in chronic kidney disease: Secondary | ICD-10-CM | POA: Diagnosis not present

## 2022-08-28 DIAGNOSIS — D509 Iron deficiency anemia, unspecified: Secondary | ICD-10-CM | POA: Diagnosis not present

## 2022-08-28 DIAGNOSIS — Z23 Encounter for immunization: Secondary | ICD-10-CM | POA: Diagnosis not present

## 2022-08-28 DIAGNOSIS — N186 End stage renal disease: Secondary | ICD-10-CM | POA: Diagnosis not present

## 2022-08-28 DIAGNOSIS — Z992 Dependence on renal dialysis: Secondary | ICD-10-CM | POA: Diagnosis not present

## 2022-08-28 DIAGNOSIS — N2581 Secondary hyperparathyroidism of renal origin: Secondary | ICD-10-CM | POA: Diagnosis not present

## 2022-08-29 ENCOUNTER — Other Ambulatory Visit: Payer: Self-pay | Admitting: Nurse Practitioner

## 2022-08-29 ENCOUNTER — Ambulatory Visit: Payer: Medicare Other | Admitting: Nurse Practitioner

## 2022-08-29 NOTE — Progress Notes (Deleted)
There were no vitals taken for this visit.   Subjective:    Patient ID: Timothy Gibson, male    DOB: 03-06-1951, 72 y.o.   MRN: [id number]  HPI: Timothy Gibson is a 72 y.o. male  No chief complaint on file.  HYPERTENSION Hypertension status: uncontrolled  Satisfied with current treatment? yes Duration of hypertension: years BP monitoring frequency:   checked frequently at dialysis BP range:  BP medication side effects:  no Medication compliance: excellent compliance Previous BP meds: Metoprolol and valsartan Aspirin: no Recurrent headaches: no Visual changes: no Palpitations: no Dyspnea: no Chest pain: no Lower extremity edema: no Dizzy/lightheaded: no  DIABETES Timothy Gibson is considering taking him off the [place] per patient.  Hypoglycemic episodes:no Polydipsia/polyuria: no Visual disturbance: no Chest pain: no Paresthesias: no Glucose Monitoring: no  Accucheck frequency: Not Checking  Fasting glucose:  Post prandial:  Evening:  Before meals: Taking Insulin?: yes  Long acting insulin: Tresiba 10u  Short acting insulin: Blood Pressure Monitoring:  checked at dialysis Retinal Examination: up to date- [facility] Foot Exam: Not up to Date Diabetic Education: Not Completed Pneumovax: Up to Date Influenza: Up to Date Aspirin: no  DIALYSIS M,W,F.  Patient states he sees Timothy. Holley Gibson about every two weeks.  He was pleased with his A1c.     COPD COPD status: {Blank single:19197::"controlled","uncontrolled","better","worse","exacerbated","stable"} Satisfied with current treatment?: {Blank single:19197::"yes","no"} Oxygen use: {Blank single:19197::"yes","no"} Dyspnea frequency:  Cough frequency:  Rescue inhaler frequency:   Limitation of activity: {Blank single:19197::"yes","no"} Productive cough:  Last Spirometry:  Pneumovax: {Blank single:19197::"Up to Date","Not up to Date","unknown"} Influenza: {Blank single:19197::"Up to Date","Not up to  Date","unknown"}   Relevant past medical, surgical, family and social history reviewed and updated as indicated. Interim medical history since our last visit reviewed. Allergies and medications reviewed and updated.  Review of Systems  HENT:  Nosebleeds: cmp. Trouble swallowing: c.   Eyes:  Negative for visual disturbance.  Respiratory:  Negative for chest tightness and shortness of breath.   Cardiovascular:  Negative for chest pain, palpitations and leg swelling.  Endocrine: Negative for polydipsia and polyuria.  Neurological:  Negative for dizziness, light-headedness, numbness and headaches.    Per HPI unless specifically indicated above     Objective:    There were no vitals taken for this visit.  Wt Readings from Last 3 Encounters:  08/24/22 262 lb 6.4 oz (119 kg)  07/13/22 240 lb (108.9 kg)  05/25/22 256 lb 4.8 oz (116.3 kg)    Physical Exam Vitals and nursing note reviewed.  Constitutional:      General: He is not in acute distress.    Appearance: Normal appearance. He is not ill-appearing, toxic-appearing or diaphoretic.  HENT:     Head: Normocephalic.     Right Ear: External ear normal.     Left Ear: External ear normal.     Nose: Nose normal. No congestion or rhinorrhea.     Mouth/Throat:     Mouth: Mucous membranes are moist.  Eyes:     General:        Right eye: No discharge.        Left eye: No discharge.     Extraocular Movements: Extraocular movements intact.     Conjunctiva/sclera: Conjunctivae normal.     Pupils: Pupils are equal, round, and reactive to light.  Cardiovascular:     Rate and Rhythm: Normal rate and regular rhythm.     Heart sounds: No murmur heard. Pulmonary:  Effort: Pulmonary effort is normal. No respiratory distress.     Breath sounds: Normal breath sounds. No wheezing, rhonchi or rales.  Abdominal:     General: Abdomen is flat. Bowel sounds are normal.  Musculoskeletal:     Cervical back: Normal range of motion and neck  supple.     Comments: Uses cane  Skin:    General: Skin is warm and dry.     Capillary Refill: Capillary refill takes less than 2 seconds.  Neurological:     General: No focal deficit present.     Mental Status: He is alert and oriented to person, place, and time.  Psychiatric:        Mood and Affect: Mood normal.        Behavior: Behavior normal.        Thought Content: Thought content normal.        Judgment: Judgment normal.     Results for orders placed or performed in visit on 05/25/22  Comp Met (CMET)  Result Value Ref Range   Glucose 268 (H) 70 - 99 mg/dL   BUN 31 (H) 8 - 27 mg/dL   Creatinine, Ser 7.90 (H) 0.76 - 1.27 mg/dL   eGFR 7 (L) >59 mL/min/1.73   BUN/Creatinine Ratio 4 (L) 10 - 24   Sodium 142 134 - 144 mmol/L   Potassium 3.6 3.5 - 5.2 mmol/L   Chloride 97 96 - 106 mmol/L   CO2 30 (H) 20 - 29 mmol/L   Calcium 9.3 8.6 - 10.2 mg/dL   Total Protein 6.7 6.0 - 8.5 g/dL   Albumin 3.8 3.8 - 4.8 g/dL   Globulin, Total 2.9 1.5 - 4.5 g/dL   Albumin/Globulin Ratio 1.3 1.2 - 2.2   Bilirubin Total 0.2 0.0 - 1.2 mg/dL   Alkaline Phosphatase 93 44 - 121 IU/L   AST 13 0 - 40 IU/L   ALT 7 0 - 44 IU/L  HgB A1c  Result Value Ref Range   Hgb A1c MFr Bld 9.6 (H) 4.8 - 5.6 %   Est. average glucose Bld gHb Est-mCnc 229 mg/dL  Hepatitis C Antibody  Result Value Ref Range   Hep C Virus Ab Non Reactive Non Reactive      Assessment & Plan:   Problem List Items Addressed This Visit       Cardiovascular and Mediastinum   Hypertension associated with diabetes ([place])   Chronic diastolic heart failure (HCC)   Atherosclerosis of native arteries of the extremities with ulceration (HCC) - Primary     Respiratory   COPD (chronic obstructive pulmonary disease) (HCC)     Endocrine   Type 2 diabetes mellitus with hyperglycemia, without long-term current use of insulin (HCC)     Genitourinary   End stage renal disease ([place])     Other   Dialysis patient ([place])      Follow  up plan: No follow-ups on file.

## 2022-08-30 DIAGNOSIS — D509 Iron deficiency anemia, unspecified: Secondary | ICD-10-CM | POA: Diagnosis not present

## 2022-08-30 DIAGNOSIS — Z992 Dependence on renal dialysis: Secondary | ICD-10-CM | POA: Diagnosis not present

## 2022-08-30 DIAGNOSIS — D631 Anemia in chronic kidney disease: Secondary | ICD-10-CM | POA: Diagnosis not present

## 2022-08-30 DIAGNOSIS — N186 End stage renal disease: Secondary | ICD-10-CM | POA: Diagnosis not present

## 2022-08-30 DIAGNOSIS — N2581 Secondary hyperparathyroidism of renal origin: Secondary | ICD-10-CM | POA: Diagnosis not present

## 2022-08-30 DIAGNOSIS — Z23 Encounter for immunization: Secondary | ICD-10-CM | POA: Diagnosis not present

## 2022-08-30 NOTE — Telephone Encounter (Signed)
Requested Prescriptions  Pending Prescriptions Disp Refills   ANORO ELLIPTA 62.5-25 MCG/ACT AEPB [Pharmacy Med Name: Celedonio Miyamoto 62.5-25 ORAL INH(30S)] 180 each 1    Sig: INHALE 1 PUFF INTO THE LUNGS DAILY     Pulmonology:  Combination Products Passed - 08/29/2022  3:10 AM      Passed - Valid encounter within last 12 months    Recent Outpatient Visits           1 month ago Chronic obstructive pulmonary disease, unspecified COPD type ([place])   [place] Jon Billings, NP   3 months ago Atherosclerosis of native artery of both lower extremities with intermittent claudication [facility])   [place], Karen, NP   6 months ago Hypertension associated with diabetes [facility])   [place] Jon Billings, NP   9 months ago Acute bacterial conjunctivitis of both eyes   [place], Akwes[place]ne T, NP   9 months ago Hypertension associated with diabetes [facility])   [place] Jon Billings, NP

## 2022-09-01 DIAGNOSIS — Z23 Encounter for immunization: Secondary | ICD-10-CM | POA: Diagnosis not present

## 2022-09-01 DIAGNOSIS — N2581 Secondary hyperparathyroidism of renal origin: Secondary | ICD-10-CM | POA: Diagnosis not present

## 2022-09-01 DIAGNOSIS — N186 End stage renal disease: Secondary | ICD-10-CM | POA: Diagnosis not present

## 2022-09-01 DIAGNOSIS — D509 Iron deficiency anemia, unspecified: Secondary | ICD-10-CM | POA: Diagnosis not present

## 2022-09-01 DIAGNOSIS — Z992 Dependence on renal dialysis: Secondary | ICD-10-CM | POA: Diagnosis not present

## 2022-09-01 DIAGNOSIS — D631 Anemia in chronic kidney disease: Secondary | ICD-10-CM | POA: Diagnosis not present

## 2022-09-04 DIAGNOSIS — D509 Iron deficiency anemia, unspecified: Secondary | ICD-10-CM | POA: Diagnosis not present

## 2022-09-04 DIAGNOSIS — D631 Anemia in chronic kidney disease: Secondary | ICD-10-CM | POA: Diagnosis not present

## 2022-09-04 DIAGNOSIS — Z992 Dependence on renal dialysis: Secondary | ICD-10-CM | POA: Diagnosis not present

## 2022-09-04 DIAGNOSIS — N2581 Secondary hyperparathyroidism of renal origin: Secondary | ICD-10-CM | POA: Diagnosis not present

## 2022-09-04 DIAGNOSIS — N186 End stage renal disease: Secondary | ICD-10-CM | POA: Diagnosis not present

## 2022-09-04 DIAGNOSIS — Z23 Encounter for immunization: Secondary | ICD-10-CM | POA: Diagnosis not present

## 2022-09-05 DIAGNOSIS — N2581 Secondary hyperparathyroidism of renal origin: Secondary | ICD-10-CM | POA: Diagnosis not present

## 2022-09-05 DIAGNOSIS — N186 End stage renal disease: Secondary | ICD-10-CM | POA: Diagnosis not present

## 2022-09-05 DIAGNOSIS — E8770 Fluid overload, unspecified: Secondary | ICD-10-CM | POA: Diagnosis not present

## 2022-09-05 DIAGNOSIS — Z992 Dependence on renal dialysis: Secondary | ICD-10-CM | POA: Diagnosis not present

## 2022-09-06 DIAGNOSIS — N186 End stage renal disease: Secondary | ICD-10-CM | POA: Diagnosis not present

## 2022-09-06 DIAGNOSIS — Z23 Encounter for immunization: Secondary | ICD-10-CM | POA: Diagnosis not present

## 2022-09-06 DIAGNOSIS — D631 Anemia in chronic kidney disease: Secondary | ICD-10-CM | POA: Diagnosis not present

## 2022-09-06 DIAGNOSIS — D509 Iron deficiency anemia, unspecified: Secondary | ICD-10-CM | POA: Diagnosis not present

## 2022-09-06 DIAGNOSIS — N2581 Secondary hyperparathyroidism of renal origin: Secondary | ICD-10-CM | POA: Diagnosis not present

## 2022-09-06 DIAGNOSIS — Z992 Dependence on renal dialysis: Secondary | ICD-10-CM | POA: Diagnosis not present

## 2022-09-07 DIAGNOSIS — Z992 Dependence on renal dialysis: Secondary | ICD-10-CM | POA: Diagnosis not present

## 2022-09-07 DIAGNOSIS — N186 End stage renal disease: Secondary | ICD-10-CM | POA: Diagnosis not present

## 2022-09-08 DIAGNOSIS — N186 End stage renal disease: Secondary | ICD-10-CM | POA: Diagnosis not present

## 2022-09-08 DIAGNOSIS — N250 Renal osteodystrophy: Secondary | ICD-10-CM | POA: Diagnosis not present

## 2022-09-08 DIAGNOSIS — D631 Anemia in chronic kidney disease: Secondary | ICD-10-CM | POA: Diagnosis not present

## 2022-09-08 DIAGNOSIS — D509 Iron deficiency anemia, unspecified: Secondary | ICD-10-CM | POA: Diagnosis not present

## 2022-09-08 DIAGNOSIS — E8779 Other fluid overload: Secondary | ICD-10-CM | POA: Diagnosis not present

## 2022-09-08 DIAGNOSIS — N2581 Secondary hyperparathyroidism of renal origin: Secondary | ICD-10-CM | POA: Diagnosis not present

## 2022-09-08 DIAGNOSIS — Z992 Dependence on renal dialysis: Secondary | ICD-10-CM | POA: Diagnosis not present

## 2022-09-11 DIAGNOSIS — N2581 Secondary hyperparathyroidism of renal origin: Secondary | ICD-10-CM | POA: Diagnosis not present

## 2022-09-11 DIAGNOSIS — D631 Anemia in chronic kidney disease: Secondary | ICD-10-CM | POA: Diagnosis not present

## 2022-09-11 DIAGNOSIS — N186 End stage renal disease: Secondary | ICD-10-CM | POA: Diagnosis not present

## 2022-09-11 DIAGNOSIS — N250 Renal osteodystrophy: Secondary | ICD-10-CM | POA: Diagnosis not present

## 2022-09-11 DIAGNOSIS — Z992 Dependence on renal dialysis: Secondary | ICD-10-CM | POA: Diagnosis not present

## 2022-09-11 DIAGNOSIS — D509 Iron deficiency anemia, unspecified: Secondary | ICD-10-CM | POA: Diagnosis not present

## 2022-09-12 ENCOUNTER — Ambulatory Visit (HOSPITAL_BASED_OUTPATIENT_CLINIC_OR_DEPARTMENT_OTHER): Payer: Medicare Other

## 2022-09-12 ENCOUNTER — Ambulatory Visit
Admission: RE | Admit: 2022-09-12 | Discharge: 2022-09-12 | Disposition: A | Discharge status: Home / Self Care | Payer: Medicare Other | Source: Ambulatory Visit | Attending: Pulmonary Disease | Admitting: Pulmonary Disease

## 2022-09-12 DIAGNOSIS — N2581 Secondary hyperparathyroidism of renal origin: Secondary | ICD-10-CM | POA: Diagnosis not present

## 2022-09-12 DIAGNOSIS — Z992 Dependence on renal dialysis: Secondary | ICD-10-CM | POA: Diagnosis not present

## 2022-09-12 DIAGNOSIS — E785 Hyperlipidemia, unspecified: Secondary | ICD-10-CM | POA: Diagnosis not present

## 2022-09-12 DIAGNOSIS — I132 Hypertensive heart and chronic kidney disease with heart failure and with stage 5 chronic kidney disease, or end stage renal disease: Secondary | ICD-10-CM | POA: Diagnosis present

## 2022-09-12 DIAGNOSIS — E8770 Fluid overload, unspecified: Secondary | ICD-10-CM | POA: Diagnosis not present

## 2022-09-12 DIAGNOSIS — E1165 Type 2 diabetes mellitus with hyperglycemia: Secondary | ICD-10-CM | POA: Diagnosis present

## 2022-09-12 DIAGNOSIS — Z820 Family history of epilepsy and other diseases of the nervous system: Secondary | ICD-10-CM | POA: Diagnosis not present

## 2022-09-12 DIAGNOSIS — Z800 Family history of malignant neoplasm of digestive organs: Secondary | ICD-10-CM | POA: Diagnosis not present

## 2022-09-12 DIAGNOSIS — Z91158 Patient's noncompliance with renal dialysis for other reason: Secondary | ICD-10-CM | POA: Diagnosis not present

## 2022-09-12 DIAGNOSIS — Z6831 Body mass index (BMI) 31.0-31.9, adult: Secondary | ICD-10-CM | POA: Diagnosis not present

## 2022-09-12 DIAGNOSIS — E440 Moderate protein-calorie malnutrition: Secondary | ICD-10-CM | POA: Diagnosis present

## 2022-09-12 DIAGNOSIS — Z66 Do not resuscitate: Secondary | ICD-10-CM | POA: Diagnosis present

## 2022-09-12 DIAGNOSIS — N250 Renal osteodystrophy: Secondary | ICD-10-CM | POA: Diagnosis not present

## 2022-09-12 DIAGNOSIS — I10 Essential (primary) hypertension: Secondary | ICD-10-CM | POA: Diagnosis not present

## 2022-09-12 DIAGNOSIS — J849 Interstitial pulmonary disease, unspecified: Secondary | ICD-10-CM

## 2022-09-12 DIAGNOSIS — D509 Iron deficiency anemia, unspecified: Secondary | ICD-10-CM | POA: Diagnosis not present

## 2022-09-12 DIAGNOSIS — I998 Other disorder of circulatory system: Secondary | ICD-10-CM | POA: Diagnosis present

## 2022-09-12 DIAGNOSIS — D631 Anemia in chronic kidney disease: Secondary | ICD-10-CM | POA: Diagnosis not present

## 2022-09-12 DIAGNOSIS — I5022 Chronic systolic (congestive) heart failure: Secondary | ICD-10-CM | POA: Diagnosis present

## 2022-09-12 DIAGNOSIS — Z85528 Personal history of other malignant neoplasm of kidney: Secondary | ICD-10-CM | POA: Diagnosis not present

## 2022-09-12 DIAGNOSIS — J449 Chronic obstructive pulmonary disease, unspecified: Secondary | ICD-10-CM | POA: Diagnosis not present

## 2022-09-12 DIAGNOSIS — D61818 Other pancytopenia: Secondary | ICD-10-CM | POA: Diagnosis not present

## 2022-09-12 DIAGNOSIS — I120 Hypertensive chronic kidney disease with stage 5 chronic kidney disease or end stage renal disease: Secondary | ICD-10-CM | POA: Diagnosis not present

## 2022-09-12 DIAGNOSIS — Z8249 Family history of ischemic heart disease and other diseases of the circulatory system: Secondary | ICD-10-CM | POA: Diagnosis not present

## 2022-09-12 DIAGNOSIS — Z833 Family history of diabetes mellitus: Secondary | ICD-10-CM | POA: Diagnosis not present

## 2022-09-12 DIAGNOSIS — E1122 Type 2 diabetes mellitus with diabetic chronic kidney disease: Secondary | ICD-10-CM | POA: Diagnosis not present

## 2022-09-12 DIAGNOSIS — F17290 Nicotine dependence, other tobacco product, uncomplicated: Secondary | ICD-10-CM | POA: Insufficient documentation

## 2022-09-12 DIAGNOSIS — J479 Bronchiectasis, uncomplicated: Secondary | ICD-10-CM | POA: Diagnosis not present

## 2022-09-12 DIAGNOSIS — Z794 Long term (current) use of insulin: Secondary | ICD-10-CM | POA: Diagnosis not present

## 2022-09-12 DIAGNOSIS — J9811 Atelectasis: Secondary | ICD-10-CM | POA: Diagnosis not present

## 2022-09-12 DIAGNOSIS — N186 End stage renal disease: Secondary | ICD-10-CM | POA: Diagnosis not present

## 2022-09-12 DIAGNOSIS — Z91A58 Caregiver's noncompliance with patient's renal dialysis for other reason: Secondary | ICD-10-CM | POA: Diagnosis not present

## 2022-09-12 DIAGNOSIS — Z87891 Personal history of nicotine dependence: Secondary | ICD-10-CM | POA: Diagnosis not present

## 2022-09-12 LAB — PULMONARY FUNCTION TEST ARMC ONLY
DL/VA % pred: 83 %
DL/VA: 3.29 ml/min/mmHg/L
DLCO unc % pred: 41 %
DLCO unc: 12.87 ml/min/mmHg
FEF 25-75 Post: 1.09 L/sec
FEF 25-75 Pre: 1.06 L/sec
FEF2575-%Change-Post: 3 %
FEF2575-%Pred-Post: 36 %
FEF2575-%Pred-Pre: 35 %
FEV1-%Change-Post: 1 %
FEV1-%Pred-Post: 36 %
FEV1-%Pred-Pre: 36 %
FEV1-Post: 1.46 L
FEV1-Pre: 1.45 L
FEV1FVC-%Change-Post: 5 %
FEV1FVC-%Pred-Pre: 96 %
FEV6-%Change-Post: -3 %
FEV6-%Pred-Post: 37 %
FEV6-%Pred-Pre: 39 %
FEV6-Post: 1.96 L
FEV6-Pre: 2.03 L
FEV6FVC-%Change-Post: 0 %
FEV6FVC-%Pred-Post: 105 %
FEV6FVC-%Pred-Pre: 105 %
FVC-%Change-Post: -3 %
FVC-%Pred-Post: 35 %
FVC-%Pred-Pre: 37 %
FVC-Post: 1.96 L
FVC-Pre: 2.03 L
Post FEV1/FVC ratio: 75 %
Post FEV6/FVC ratio: 100 %
Pre FEV1/FVC ratio: 71 %
Pre FEV6/FVC Ratio: 100 %
RV % pred: 93 %
RV: 2.61 L
TLC % pred: 55 %
TLC: 4.53 L

## 2022-09-12 MED ORDER — ALBUTEROL SULFATE (2.5 MG/3ML) 0.083% IN NEBU
2.5000 mg | INHALATION_SOLUTION | Freq: Once | RESPIRATORY_TRACT | Status: AC
  Administered 2022-09-12: 2.5 mg via RESPIRATORY_TRACT

## 2022-09-13 ENCOUNTER — Inpatient Hospital Stay
Admission: EM | Admit: 2022-09-13 | Discharge: 2022-09-15 | DRG: 640 | Disposition: A | Discharge status: Home / Self Care | Payer: Medicare Other | Attending: Internal Medicine | Admitting: Internal Medicine

## 2022-09-13 ENCOUNTER — Other Ambulatory Visit: Payer: Self-pay

## 2022-09-13 ENCOUNTER — Emergency Department: Payer: Medicare Other

## 2022-09-13 DIAGNOSIS — Z66 Do not resuscitate: Secondary | ICD-10-CM | POA: Diagnosis present

## 2022-09-13 DIAGNOSIS — N186 End stage renal disease: Secondary | ICD-10-CM | POA: Diagnosis present

## 2022-09-13 DIAGNOSIS — E8770 Fluid overload, unspecified: Principal | ICD-10-CM | POA: Diagnosis present

## 2022-09-13 DIAGNOSIS — D509 Iron deficiency anemia, unspecified: Secondary | ICD-10-CM | POA: Diagnosis not present

## 2022-09-13 DIAGNOSIS — E1165 Type 2 diabetes mellitus with hyperglycemia: Secondary | ICD-10-CM | POA: Diagnosis present

## 2022-09-13 DIAGNOSIS — Z7951 Long term (current) use of inhaled steroids: Secondary | ICD-10-CM

## 2022-09-13 DIAGNOSIS — I10 Essential (primary) hypertension: Secondary | ICD-10-CM | POA: Diagnosis present

## 2022-09-13 DIAGNOSIS — D61818 Other pancytopenia: Secondary | ICD-10-CM | POA: Diagnosis present

## 2022-09-13 DIAGNOSIS — N2581 Secondary hyperparathyroidism of renal origin: Secondary | ICD-10-CM | POA: Diagnosis present

## 2022-09-13 DIAGNOSIS — J9811 Atelectasis: Secondary | ICD-10-CM | POA: Diagnosis not present

## 2022-09-13 DIAGNOSIS — E785 Hyperlipidemia, unspecified: Secondary | ICD-10-CM

## 2022-09-13 DIAGNOSIS — Z800 Family history of malignant neoplasm of digestive organs: Secondary | ICD-10-CM

## 2022-09-13 DIAGNOSIS — I132 Hypertensive heart and chronic kidney disease with heart failure and with stage 5 chronic kidney disease, or end stage renal disease: Secondary | ICD-10-CM | POA: Diagnosis present

## 2022-09-13 DIAGNOSIS — Z91A58 Caregiver's noncompliance with patient's renal dialysis for other reason: Secondary | ICD-10-CM

## 2022-09-13 DIAGNOSIS — Z87891 Personal history of nicotine dependence: Secondary | ICD-10-CM

## 2022-09-13 DIAGNOSIS — E440 Moderate protein-calorie malnutrition: Secondary | ICD-10-CM | POA: Insufficient documentation

## 2022-09-13 DIAGNOSIS — D631 Anemia in chronic kidney disease: Secondary | ICD-10-CM | POA: Diagnosis present

## 2022-09-13 DIAGNOSIS — Z992 Dependence on renal dialysis: Secondary | ICD-10-CM

## 2022-09-13 DIAGNOSIS — Z820 Family history of epilepsy and other diseases of the nervous system: Secondary | ICD-10-CM

## 2022-09-13 DIAGNOSIS — Z794 Long term (current) use of insulin: Secondary | ICD-10-CM

## 2022-09-13 DIAGNOSIS — H538 Other visual disturbances: Secondary | ICD-10-CM | POA: Diagnosis not present

## 2022-09-13 DIAGNOSIS — J449 Chronic obstructive pulmonary disease, unspecified: Secondary | ICD-10-CM | POA: Diagnosis not present

## 2022-09-13 DIAGNOSIS — Z5309 Procedure and treatment not carried out because of other contraindication: Secondary | ICD-10-CM | POA: Diagnosis not present

## 2022-09-13 DIAGNOSIS — E1129 Type 2 diabetes mellitus with other diabetic kidney complication: Secondary | ICD-10-CM | POA: Diagnosis present

## 2022-09-13 DIAGNOSIS — I120 Hypertensive chronic kidney disease with stage 5 chronic kidney disease or end stage renal disease: Secondary | ICD-10-CM | POA: Diagnosis not present

## 2022-09-13 DIAGNOSIS — E1122 Type 2 diabetes mellitus with diabetic chronic kidney disease: Secondary | ICD-10-CM | POA: Diagnosis present

## 2022-09-13 DIAGNOSIS — Z833 Family history of diabetes mellitus: Secondary | ICD-10-CM

## 2022-09-13 DIAGNOSIS — Z91048 Other nonmedicinal substance allergy status: Secondary | ICD-10-CM

## 2022-09-13 DIAGNOSIS — Z6831 Body mass index (BMI) 31.0-31.9, adult: Secondary | ICD-10-CM

## 2022-09-13 DIAGNOSIS — N250 Renal osteodystrophy: Secondary | ICD-10-CM | POA: Diagnosis not present

## 2022-09-13 DIAGNOSIS — E669 Obesity, unspecified: Secondary | ICD-10-CM | POA: Diagnosis present

## 2022-09-13 DIAGNOSIS — Z8249 Family history of ischemic heart disease and other diseases of the circulatory system: Secondary | ICD-10-CM

## 2022-09-13 DIAGNOSIS — Z79899 Other long term (current) drug therapy: Secondary | ICD-10-CM

## 2022-09-13 DIAGNOSIS — Z85528 Personal history of other malignant neoplasm of kidney: Secondary | ICD-10-CM

## 2022-09-13 DIAGNOSIS — I998 Other disorder of circulatory system: Secondary | ICD-10-CM | POA: Diagnosis present

## 2022-09-13 DIAGNOSIS — M7989 Other specified soft tissue disorders: Secondary | ICD-10-CM | POA: Diagnosis present

## 2022-09-13 DIAGNOSIS — Z91158 Patient's noncompliance with renal dialysis for other reason: Secondary | ICD-10-CM

## 2022-09-13 DIAGNOSIS — I5022 Chronic systolic (congestive) heart failure: Secondary | ICD-10-CM | POA: Diagnosis present

## 2022-09-13 LAB — IRON AND TIBC
Iron: 51 ug/dL (ref 45–182)
Saturation Ratios: 30 % (ref 17.9–39.5)
TIBC: 168 ug/dL — ABNORMAL LOW (ref 250–450)
UIBC: 117 ug/dL

## 2022-09-13 LAB — FOLATE: Folate: 9.8 ng/mL (ref >5.9)

## 2022-09-13 LAB — CBC
HCT: 28.4 % — ABNORMAL LOW (ref 39.0–52.0)
Hemoglobin: 9 g/dL — ABNORMAL LOW (ref 13.0–17.0)
MCH: 31.8 pg (ref 26.0–34.0)
MCHC: 31.7 g/dL (ref 30.0–36.0)
MCV: 100.4 fL — ABNORMAL HIGH (ref 80.0–100.0)
Platelets: 114 10*3/uL — ABNORMAL LOW (ref 150–400)
RBC: 2.83 MIL/uL — ABNORMAL LOW (ref 4.22–5.81)
RDW: 14 % (ref 11.5–15.5)
WBC: 3.1 10*3/uL — ABNORMAL LOW (ref 4.0–10.5)
nRBC: 0 % (ref 0.0–0.2)

## 2022-09-13 LAB — BASIC METABOLIC PANEL
Anion gap: 10 (ref 5–15)
BUN: 42 mg/dL — ABNORMAL HIGH (ref 8–23)
CO2: 31 mmol/L (ref 22–32)
Calcium: 8.8 mg/dL — ABNORMAL LOW (ref 8.9–10.3)
Chloride: 98 mmol/L (ref 98–111)
Creatinine, Ser: 6.88 mg/dL — ABNORMAL HIGH (ref 0.61–1.24)
GFR, Estimated: 8 mL/min — ABNORMAL LOW (ref >60)
Glucose, Bld: 280 mg/dL — ABNORMAL HIGH (ref 70–99)
Potassium: 3.7 mmol/L (ref 3.5–5.1)
Sodium: 139 mmol/L (ref 135–145)

## 2022-09-13 LAB — MRSA NEXT GEN BY PCR, NASAL: MRSA by PCR Next Gen: NOT DETECTED

## 2022-09-13 LAB — RETICULOCYTES
Immature Retic Fract: 15.9 % (ref 2.3–15.9)
RBC.: 2.93 MIL/uL — ABNORMAL LOW (ref 4.22–5.81)
Retic Count, Absolute: 50.4 10*3/uL (ref 19.0–186.0)
Retic Ct Pct: 1.7 % (ref 0.4–3.1)

## 2022-09-13 LAB — VITAMIN B12: Vitamin B-12: 275 pg/mL (ref 180–914)

## 2022-09-13 LAB — FERRITIN: Ferritin: 1040 ng/mL — ABNORMAL HIGH (ref 24–336)

## 2022-09-13 LAB — BRAIN NATRIURETIC PEPTIDE: B Natriuretic Peptide: 491.7 pg/mL — ABNORMAL HIGH (ref 0.0–100.0)

## 2022-09-13 LAB — GLUCOSE, CAPILLARY: Glucose-Capillary: 314 mg/dL — ABNORMAL HIGH (ref 70–99)

## 2022-09-13 MED ORDER — SEVELAMER CARBONATE 800 MG PO TABS
2400.0000 mg | ORAL_TABLET | Freq: Three times a day (TID) | ORAL | Status: DC
  Filled 2022-09-13: qty 4

## 2022-09-13 MED ORDER — HEPARIN SODIUM (PORCINE) 1000 UNIT/ML DIALYSIS: 1000.0000 [IU] | INTRAMUSCULAR | Status: DC | PRN

## 2022-09-13 MED ORDER — TRAZODONE HCL 50 MG PO TABS: 50.0000 mg | ORAL_TABLET | Freq: Every evening | ORAL | Status: DC | PRN

## 2022-09-13 MED ORDER — HEPARIN SODIUM (PORCINE) 5000 UNIT/ML IJ SOLN
5000.0000 [IU] | Freq: Three times a day (TID) | INTRAMUSCULAR | Status: DC
  Administered 2022-09-13 – 2022-09-15 (×6): 5000 [IU] via SUBCUTANEOUS
  Filled 2022-09-13 (×6): qty 1

## 2022-09-13 MED ORDER — INSULIN ASPART 100 UNIT/ML IJ SOLN
0.0000 [IU] | Freq: Every day | INTRAMUSCULAR | Status: DC
  Administered 2022-09-13: 4 [IU] via SUBCUTANEOUS
  Administered 2022-09-14: 3 [IU] via SUBCUTANEOUS
  Filled 2022-09-13 (×2): qty 1

## 2022-09-13 MED ORDER — METOPROLOL SUCCINATE ER 25 MG PO TB24
25.0000 mg | ORAL_TABLET | Freq: Every day | ORAL | Status: DC
  Administered 2022-09-13 – 2022-09-15 (×3): 25 mg via ORAL
  Filled 2022-09-13 (×3): qty 1

## 2022-09-13 MED ORDER — DIPHENHYDRAMINE HCL 50 MG/ML IJ SOLN: 12.5000 mg | Freq: Three times a day (TID) | INTRAMUSCULAR | Status: DC | PRN

## 2022-09-13 MED ORDER — HYDRALAZINE HCL 20 MG/ML IJ SOLN
5.0000 mg | INTRAMUSCULAR | Status: DC | PRN
  Administered 2022-09-13 (×2): 5 mg via INTRAVENOUS
  Filled 2022-09-13 (×2): qty 1

## 2022-09-13 MED ORDER — INSULIN GLARGINE-YFGN 100 UNIT/ML ~~LOC~~ SOLN
15.0000 [IU] | Freq: Every day | SUBCUTANEOUS | Status: DC
  Administered 2022-09-13 – 2022-09-14 (×2): 15 [IU] via SUBCUTANEOUS
  Filled 2022-09-13 (×3): qty 0.15

## 2022-09-13 MED ORDER — DM-GUAIFENESIN ER 30-600 MG PO TB12: 1.0000 | ORAL_TABLET | Freq: Two times a day (BID) | ORAL | Status: DC | PRN

## 2022-09-13 MED ORDER — ANTICOAGULANT SODIUM CITRATE 4% (200MG/5ML) IV SOLN: 5.0000 mL | Status: DC | PRN

## 2022-09-13 MED ORDER — LIDOCAINE-PRILOCAINE 2.5-2.5 % EX CREA: 1.0000 | TOPICAL_CREAM | CUTANEOUS | Status: DC | PRN

## 2022-09-13 MED ORDER — CHLORHEXIDINE GLUCONATE CLOTH 2 % EX PADS
6.0000 | MEDICATED_PAD | Freq: Every day | CUTANEOUS | Status: DC
  Administered 2022-09-14 – 2022-09-15 (×2): 6 via TOPICAL

## 2022-09-13 MED ORDER — SACUBITRIL-VALSARTAN 24-26 MG PO TABS
1.0000 | ORAL_TABLET | Freq: Two times a day (BID) | ORAL | Status: DC
  Administered 2022-09-13 – 2022-09-14 (×4): 1 via ORAL
  Filled 2022-09-13 (×6): qty 1

## 2022-09-13 MED ORDER — ACETAMINOPHEN 325 MG PO TABS: 650.0000 mg | ORAL_TABLET | Freq: Four times a day (QID) | ORAL | Status: DC | PRN

## 2022-09-13 MED ORDER — PENTAFLUOROPROP-TETRAFLUOROETH EX AERO: 1.0000 | INHALATION_SPRAY | CUTANEOUS | Status: DC | PRN

## 2022-09-13 MED ORDER — LIDOCAINE HCL (PF) 1 % IJ SOLN: 5.0000 mL | INTRAMUSCULAR | Status: DC | PRN

## 2022-09-13 MED ORDER — ALBUTEROL SULFATE (2.5 MG/3ML) 0.083% IN NEBU: 3.0000 mL | INHALATION_SOLUTION | Freq: Four times a day (QID) | RESPIRATORY_TRACT | Status: DC | PRN

## 2022-09-13 MED ORDER — ALTEPLASE 2 MG IJ SOLR: 2.0000 mg | Freq: Once | INTRAMUSCULAR | Status: DC | PRN

## 2022-09-13 MED ORDER — INSULIN ASPART 100 UNIT/ML IJ SOLN
0.0000 [IU] | Freq: Three times a day (TID) | INTRAMUSCULAR | Status: DC
  Administered 2022-09-14: 2 [IU] via SUBCUTANEOUS
  Filled 2022-09-13: qty 1

## 2022-09-13 NOTE — H&P (Signed)
History and Physical    Timothy Gibson [id number] DOB: 21-Jul-1950 DOA: 09/13/2022  Referring MD/NP/PA:   PCP: Jon Billings, NP   Patient coming from:  The patient is coming from home.    Chief Complaint: Worsening leg edema, shortness breath, weight gain  HPI: Timothy Gibson is a 72 y.o. male with medical history significant of ESRD-HD (MWF), hypertension, diabetes mellitus, COPD, CHF with EF of 40-45%, right kidney carcinoma, who presents with worsening leg edema, shortness breath, weight gain.    Pt states that he has progressively worsening bilateral leg edema in the past 6 weeks, with mild shortness breath, mild intermittent dry cough, denies chest pain, fever or chills.  Patient has gained more than 5 pounds recently.  Denies nausea vomiting, diarrhea or abdominal pain.  No symptoms of UTI.  Patient states that has only been able to tolerate partial dialysis sessions since past 6 weeks since his blood pressure has been fluctuating during the dialysis, sometimes with low blood pressure and other times with very high blood pressure.  Data reviewed independently and ED Course: pt was found to have BNP 491, pancytopenia with WBC 3.1, hemoglobin 9.0, platelets 114, temperature normal, blood pressure 195/61, 2 6/154, 140/95, heart rate 90, RR 20, oxygen saturation 93-100% on room air.  Chest x-ray negative.  Patient is placed on telemetry bed for patient.  Dr. Juleen China of renal was consulted.      EKG: I have personally reviewed.  Sinus rhythm, QTc 505, LAE, LAD, poor R wave progression   Review of Systems:   General: no fevers, chills, has body weight gain, has fatigue HEENT: no blurry vision, hearing changes or sore throat Respiratory: has dyspnea, coughing, no wheezing CV: no chest pain, no palpitations GI: no nausea, vomiting, abdominal pain, diarrhea, constipation GU: no dysuria, burning on urination, increased urinary frequency, hematuria  Ext: has leg edema Neuro: no  unilateral weakness, numbness, or tingling, no vision change or hearing loss Skin: no rash, no skin tear. MSK: No muscle spasm, no deformity, no limitation of range of movement in spin Heme: No easy bruising.  Travel history: No recent long distant travel.   Allergy:  Allergies  Allergen Reactions   Adhesive [Tape]     Pulls skin off.  Must use paper tape    Past Medical History:  Diagnosis Date   Cancer ([place])    right kidney   Diabetes mellitus without complication ([place])    Dialysis patient [facility])    on Mon-Wed-Fri dialysis   ESRD (end stage renal disease) ([place])    History of gangrene    Hypertension     Past Surgical History:  Procedure Laterality Date   A/V FISTULAGRAM Left 06/04/2018   Procedure: A/V FISTULAGRAM;  Surgeon: Katha Cabal, MD;  Location: [place] [facility];  Service: Cardiovascular;  Laterality: Left;   COLONOSCOPY WITH PROPOFOL N/A 12/16/2015   Procedure: COLONOSCOPY WITH PROPOFOL;  Surgeon: Manya Silvas, MD;  Location: [facility] ENDOSCOPY;  Service: Endoscopy;  Laterality: N/A;   KNEE SURGERY     left forearm AV fistula     LOWER EXTREMITY ANGIOGRAPHY Left 07/24/2019   Procedure: LOWER EXTREMITY ANGIOGRAPHY;  Surgeon: Algernon Huxley, MD;  Location: [employer] [facility];  Service: Cardiovascular;  Laterality: Left;   NEPHRECTOMY     PERIPHERAL VASCULAR THROMBECTOMY Left 09/01/2020   Procedure: PERIPHERAL VASCULAR THROMBECTOMY;  Surgeon: Katha Cabal, MD;  Location: [place] [facility];  Service: Cardiovascular;  Laterality: Left;  Social History:  reports that he has quit smoking. His smoking use included cigars. He has quit using smokeless tobacco. He reports that he does not currently use alcohol. He reports that he does not use drugs.  Family History:  Family History  Problem Relation Age of Onset   Hypertension Father    Colon cancer Father    Hypertension Mother    Parkinson's disease Mother    Diabetes Mother    Alzheimer's disease  Mother      Prior to Admission medications   Medication Sig Start Date End Date Taking? Authorizing Provider  albuterol (VENTOLIN HFA) 108 (90 Base) MCG/ACT inhaler Inhale 2 puffs into the lungs every 6 (six) hours as needed for wheezing or shortness of breath. 02/14/22   Jon Billings, NP  Jearl Klinefelter ELLIPTA 62.5-25 MCG/ACT AEPB INHALE 1 PUFF INTO THE LUNGS DAILY 08/30/22   Jon Billings, NP  clotrimazole-betamethasone (LOTRISONE) cream Apply 1 Application topically daily. Patient not taking: Reported on 08/24/2022 02/14/22   Jon Billings, NP  Insulin Glargine [facility]) 100 UNIT/ML Inject 20 Units into the skin at bedtime. 08/21/22   Jon Billings, NP  lidocaine-prilocaine (EMLA) cream Apply 1 Application topically. Patient not taking: Reported on 08/24/2022 06/09/22 06/09/23  [provider]  metoprolol succinate (TOPROL-XL) 25 MG 24 hr tablet Take 1 tablet (25 mg total) by mouth daily. Patient not taking: Reported on 08/24/2022 08/22/22 08/22/23  Jon Billings, NP  sacubitril-valsartan (ENTRESTO) 24-26 MG Take 1 tablet by mouth every 12 (twelve) hours. 05/05/21   [provider]  sevelamer carbonate (RENVELA) 800 MG tablet Take 2,400-3,200 mg by mouth 3 (three) times daily with meals. Patient not taking: Reported on 08/24/2022    [provider]  urea (CARMOL) 40 % ointment Apply 1 application. topically daily as needed (Dead skin). Patient not taking: Reported on 08/24/2022    [provider]    Physical Exam: Vitals:   09/13/22 1500 09/13/22 1510 09/13/22 1536 09/13/22 1734  BP: (!) 190/31 (!) 140/95 (!) 191/96 (!) 185/79  Pulse: 74 73 75 81  Resp: '19 14 18   '$ Temp:   97.6 F (36.4 C)   TempSrc:      SpO2: 97% 97% 94%   Weight:      Height:       General: Not in acute distress HEENT:       Eyes: PERRL, EOMI, no scleral icterus.       ENT: No discharge from the ears and nose, no pharynx injection, no tonsillar enlargement.         Neck: positive JVD, no bruit, no mass felt. Heme: No neck lymph node enlargement. Cardiac: S1/S2, RRR, No murmurs, No gallops or rubs. Respiratory: No rales, wheezing, rhonchi or rubs. GI: Soft, nondistended, nontender, no rebound pain, no organomegaly, BS present. GU: No hematuria Ext: 1+ pitting leg edema bilaterally. 1+DP/PT pulse bilaterally. Musculoskeletal: No joint deformities, No joint redness or warmth, no limitation of ROM in spin. Skin: No rashes.  Neuro: Alert, oriented X3, cranial nerves II-XII grossly intact, moves all extremities normally.  Psych: Patient is not psychotic, no suicidal or hemocidal ideation.  Labs on Admission: I have personally reviewed following labs and imaging studies  CBC: Recent Labs  Lab 09/13/22 1204  WBC 3.1*  HGB 9.0*  HCT 28.4*  MCV 100.4*  PLT [id number]*   Basic Metabolic Panel: Recent Labs  Lab 09/13/22 1204  NA 139  K 3.7  CL 98  CO2 31  GLUCOSE 280*  BUN 42*  CREATININE 6.88*  CALCIUM 8.8*   GFR: Estimated Creatinine Clearance: 13.5 mL/min (A) (by C-G formula based on SCr of 6.88 mg/dL (H)). Liver Function Tests: No results for input(s): "AST", "ALT", "ALKPHOS", "BILITOT", "PROT", "ALBUMIN" in the last 168 hours. No results for input(s): "LIPASE", "AMYLASE" in the last 168 hours. No results for input(s): "AMMONIA" in the last 168 hours. Coagulation Profile: No results for input(s): "INR", "PROTIME" in the last 168 hours. Cardiac Enzymes: No results for input(s): "CKTOTAL", "CKMB", "CKMBINDEX", "TROPONINI" in the last 168 hours. BNP (last 3 results) No results for input(s): "PROBNP" in the last 8760 hours. HbA1C: No results for input(s): "HGBA1C" in the last 72 hours. CBG: No results for input(s): "GLUCAP" in the last 168 hours. Lipid Profile: No results for input(s): "CHOL", "HDL", "LDLCALC", "TRIG", "CHOLHDL", "LDLDIRECT" in the last 72 hours. Thyroid Function Tests: No results for input(s): "TSH", "T4TOTAL", "FREET4",  "T3FREE", "THYROIDAB" in the last 72 hours. Anemia Panel: No results for input(s): "VITAMINB12", "FOLATE", "FERRITIN", "TIBC", "IRON", "RETICCTPCT" in the last 72 hours. Urine analysis:    Component Value Date/Time   COLORURINE Yellow 07/22/2011 1830   APPEARANCEUR Turbid 07/22/2011 1830   LABSPEC 1.012 07/22/2011 1830   PHURINE 5.0 07/22/2011 1830   GLUCOSEU >=500 07/22/2011 1830   HGBUR 2+ 07/22/2011 1830   BILIRUBINUR Negative 07/22/2011 1830   KETONESUR Negative 07/22/2011 1830   PROTEINUR 100 mg/dL 07/22/2011 1830   NITRITE Negative 07/22/2011 1830   LEUKOCYTESUR Trace 07/22/2011 1830   Sepsis Labs: '@LABRCNTIP'$ (procalcitonin:4,lacticidven:4) )No results found for this or any previous visit (from the past 240 hour(s)).   Radiological Exams on Admission: CT CHEST HIGH RESOLUTION  Result Date: 09/13/2022 CLINICAL DATA:  Interstitial lung disease EXAM: CT CHEST WITHOUT CONTRAST TECHNIQUE: Multidetector CT imaging of the chest was performed following the standard protocol without intravenous contrast. High resolution imaging of the lungs, as well as inspiratory and expiratory imaging, was performed. RADIATION DOSE REDUCTION: This exam was performed according to the departmental dose-optimization program which includes automated exposure control, adjustment of the mA and/or kV according to patient size and/or use of iterative reconstruction technique. COMPARISON:  Chest CT dated September 25, 2015 FINDINGS: Cardiovascular: Mild cardiomegaly. No pericardial effusion. Normal caliber thoracic aorta with severe atherosclerotic disease. Severe left main and three-vessel coronary artery calcifications. Mitral annular calcifications. Mediastinum/Nodes: Esophagus and thyroid are unremarkable. No pathologically enlarged lymph nodes seen in the chest. Lungs/Pleura: Nodule of the posterior wall of the trachea measuring 9 mm series 2, image 37. bibasilar atelectasis. Elevation of the right hemidiaphragm, new  when compared to 2017 prior. Mild cylindrical bronchiectasis of the right lower lobe. No consolidation, pleural effusion or pneumothorax. Upper Abdomen: Numerous partially visualized cystic lesions of the left kidney, evaluation is limited due to lack of IV contrast streak artifact. No acute abnormality. Musculoskeletal: New lucent lesions located adjacent to the endplates and disc space height loss at T10-T11, likely due to Schmorl's nodes. No aggressive appearing osseous lesions. IMPRESSION: 1. Elevation of the right hemidiaphragm with right-greater-than-left basilar atelectasis. No evidence of interstitial lung disease. 2. Nodule of the posterior wall of the trachea measuring 9 mm, possibly due to adherent mucus, although tracheal neoplasm is also a concern. Recommend follow-up chest CT in 3 months or bronchoscopy for further evaluation. 3. Right lower lobe bronchiectasis, likely sequela of infection or aspiration. 4. Numerous partially visualized cystic lesions of the left kidney, evaluation is limited due to lack of IV contrast and streak artifact. Renal protocol CT or  MRI could be performed more complete evaluation. 5. Severe left main and three-vessel coronary artery calcifications. 6. Aortic Atherosclerosis (ICD10-I70.0). Electronically Signed   By: Yetta Glassman M.D.   On: 09/13/2022 13:50   DG Chest 2 View  Result Date: 09/13/2022 CLINICAL DATA:  Lower extremity edema EXAM: CHEST - 2 VIEW COMPARISON:  Chest x-ray dated August 24, 2022 FINDINGS: The heart size and mediastinal contours are within normal limits. Elevation of the right hemidiaphragm. Right basilar atelectasis. Both lungs are otherwise clear. The visualized skeletal structures are unremarkable. IMPRESSION: No active cardiopulmonary disease. Electronically Signed   By: Yetta Glassman M.D.   On: 09/13/2022 13:01      Assessment/Plan Principal Problem:   Fluid overload Active Problems:   End stage renal disease (HCC)   Chronic  systolic CHF (congestive heart failure) (HCC)   COPD (chronic obstructive pulmonary disease) (HCC)   Pancytopenia (HCC)   Type II diabetes mellitus with renal manifestations (HCC)   HTN (hypertension)   Assessment and Plan:  Fluid overload and end stage renal disease (on HD- MWF): Patient has worsening leg edema, weight gain, elevated BNP, positive JVD, clinically consistent with fluid overload.  Oxygen saturation 93-100% on room air.  Consulted Dr. Juleen China of renal for dialysis -Placed on TeleMed follow-up physician -As needed albuterol -Renal diet -Volume management per renal by dialysis  Chronic systolic CHF (congestive heart failure) ([place]): 2D echo 03/22/2017 showed EF of 40-45%.  Patient has fluid overload -Volume management per renal by dialysis  COPD (chronic obstructive pulmonary disease) (HCC) -Bronchodilators and as needed Mucinex  Pancytopenia ([place]): This is chronic issue.  Hemoglobin 12.1 on 11/17/2021 --> 9.0 today. -Check anemia panel  Type II diabetes mellitus with renal manifestations [facility]): Recent A1c 9.6, poorly controlled.  Patient is taking glargine insulin 20 units daily - SSI -Glargine insulin 15 units daily  HTN (hypertension): Patient's blood pressure has been fluctuating -IV hydralazine as needed for SBP > 175 -Metoprolol, Entresto,     DVT ppx: SQ Heparin     Code Status: DNR (I discussed with patient in the presence of his care giver and explained the meaning of CODE STATUS. Patient wants to be DNR)  Family Communication:  Yes, patient's care giver  at bed side.   Disposition Plan:  Anticipate discharge back to previous environment  Consults called:  Dr. Juleen China  Admission status and Level of care: Telemetry Medical:   for obs   Dispo: The patient is from: Home              Anticipated d/c is to: Home              Anticipated d/c date is: 1 day              Patient currently is not medically stable to d/c.    Severity of Illness:  The  appropriate patient status for this patient is OBSERVATION. Observation status is judged to be reasonable and necessary in order to provide the required intensity of service to ensure the patient's safety. The patient's presenting symptoms, physical exam findings, and initial radiographic and laboratory data in the context of their medical condition is felt to place them at decreased risk for further clinical deterioration. Furthermore, it is anticipated that the patient will be medically stable for discharge from the hospital within 2 midnights of admission.        Date of Service 09/13/2022    Ivor Costa [facility]   If 7PM-7AM, please contact  night-coverage [email or web address] 09/13/2022, 5:58 PM

## 2022-09-13 NOTE — Progress Notes (Signed)
   09/13/22 1825  Vitals  BP (!) 184/58  MEWS COLOR  MEWS Score Color Green  MEWS Score  MEWS Temp 0  MEWS Systolic 0  MEWS Pulse 0  MEWS RR 0  MEWS LOC 0  MEWS Score 0   Patient's BP 184/58 after PRN hydralazine was given. Notified Niu, MD and received verbal orders to give another dose of PRn hydralazine now.

## 2022-09-13 NOTE — ED Provider Notes (Signed)
[facility] Provider Note    Event Date/Time   First MD Initiated Contact with Patient 09/13/22 1416     (approximate)   History   Leg Swelling   HPI  Timothy Gibson is a 72 y.o. male with a history of ESRD on dialysis, diabetes, and hypertension who presents with worsening shortness of breath with short exertion over the last few months, gradual onset, and associated with worsening swelling.  The patient is unable to complete dialysis treatments due to hypotension and headache.  He did have dialysis today although still feels swollen and short of breath.  He was recommended to come to the hospital for admission for further workup and treatment.  I reviewed the past medical records.  The patient was most recently seen by [facility] on 2/15 for exertional shortness of breath.  He was ordered for an echocardiogram and trialed on Trelegy Ellipta.  He has no recent ED visits or admissions.  Physical Exam   Triage Vital Signs: ED Triage Vitals  Enc Vitals Group     BP 09/13/22 1158 (!) 183/58     Pulse Rate 09/13/22 1158 88     Resp 09/13/22 1158 16     Temp 09/13/22 1158 97.8 F (36.6 C)     Temp Source 09/13/22 1158 Oral     SpO2 09/13/22 1158 100 %     Weight 09/13/22 1158 246 lb (111.6 kg)     Height 09/13/22 1158 '6\' 4"'$  (1.93 m)     Head Circumference --      Peak Flow --      Pain Score 09/13/22 1157 9     Pain Loc --      Pain Edu? --      Excl. in [place]? --     Most recent vital signs: Vitals:   09/13/22 1330 09/13/22 1500  BP: (!) 199/54 (!) 190/31  Pulse: 72 74  Resp: (!) 22 19  Temp:    SpO2: 96% 97%    General: Awake, no distress.  CV:  Good peripheral perfusion.  Resp:  Normal effort.  Somewhat diminished breath sounds bilaterally.  No significant rales. Abd:  No distention.  Other:  2+ bilateral lower extremity edema.   ED Results / Procedures / Treatments   Labs (all labs ordered are listed, but only abnormal  results are displayed) Labs Reviewed  BASIC METABOLIC PANEL - Abnormal; Notable for the following components:      Result Value   Glucose, Bld 280 (*)    BUN 42 (*)    Creatinine, Ser 6.88 (*)    Calcium 8.8 (*)    GFR, Estimated 8 (*)    All other components within normal limits  CBC - Abnormal; Notable for the following components:   WBC 3.1 (*)    RBC 2.83 (*)    Hemoglobin 9.0 (*)    HCT 28.4 (*)    MCV 100.4 (*)    Platelets 114 (*)    All other components within normal limits  BRAIN NATRIURETIC PEPTIDE - Abnormal; Notable for the following components:   B Natriuretic Peptide 491.7 (*)    All other components within normal limits  HEPATITIS B SURFACE ANTIGEN  HEPATITIS B SURFACE ANTIBODY, QUANTITATIVE  HEPATITIS B E ANTIBODY  CBC  CREATININE, SERUM     EKG  ED ECG REPORT I, Arta Silence, the attending physician, personally viewed and interpreted this ECG.  Date: 09/13/2022 EKG Time: 1215 Rate: 87  Rhythm: normal sinus rhythm QRS Axis: normal Intervals: LAFB ST/T Wave abnormalities: normal Narrative Interpretation: no evidence of acute ischemia    RADIOLOGY  Chest x-ray: I independently viewed and interpreted the images; there is no focal consolidation or edema  PROCEDURES:  Critical Care performed: No  Procedures   MEDICATIONS ORDERED IN ED: Medications  heparin injection 5,000 Units (has no administration in time range)  metoprolol succinate (TOPROL-XL) 24 hr tablet 25 mg (has no administration in time range)  sacubitril-valsartan (ENTRESTO) 24-26 mg per tablet (has no administration in time range)  traZODone (DESYREL) tablet 50 mg (has no administration in time range)  sevelamer carbonate (RENVELA) tablet 2,400-3,200 mg (has no administration in time range)  albuterol (PROVENTIL) (2.5 MG/3ML) 0.083% nebulizer solution 3 mL (has no administration in time range)  insulin glargine-yfgn (SEMGLEE) injection 15 Units (has no administration in time  range)     IMPRESSION / MDM / ASSESSMENT AND PLAN / ED COURSE  I reviewed the triage vital signs and the nursing notes.  72 year old male with PMH as noted above presents with worsening shortness of breath and edema over the last several weeks despite compliance with his dialysis.  On exam the patient has peripheral edema although is not in acute respiratory distress.  He is hypertensive with otherwise normal vital signs.  Differential diagnosis includes, but is not limited to, fluid overload due to ESRD, acute on chronic CHF.  I consulted and discussed the case with Dr. Juleen China from nephrology who agrees with admission for further workup and treatment.  Will obtain labs and plan for admission.  Patient's presentation is most consistent with acute presentation with potential threat to life or bodily function.  The patient is on the cardiac monitor to evaluate for evidence of arrhythmia and/or significant heart rate changes.   ----------------------------------------- 3:16 PM on 09/13/2022 -----------------------------------------  Lab workup shows somewhat elevated BNP.  Creatinine is consistent with baseline.  Electrolytes are unremarkable.  Hemoglobin is 9 which is down from the patient's recent baseline.  I consulted Dr. Blaine Hamper from the hospitalist service; based on her discussion he agrees to admit the patient.  FINAL CLINICAL IMPRESSION(S) / ED DIAGNOSES   Final diagnoses:  ESRD (end stage renal disease) ([place])     Rx / DC Orders   ED Discharge Orders     None        Note:  This document was prepared using Dragon voice recognition software and may include unintentional dictation errors.    Arta Silence, MD 09/13/22 [phone]

## 2022-09-13 NOTE — ED Triage Notes (Signed)
Pt to ED with friend/caregiver who he lives with, with complaints of bilateral lower leg edema mostly in ankles, gradually worsening since 6 weeks ago.  Pt has L arm restricted, gets M W F dialysis, last session this AM and was 3h 15 min. States has only been able to tolerate partial dialysis sessions since past 6 weeks (same as onset of leg edema) because gets HA and low BP during dialysis. Does not take diuretics.  Pt has unlabored respirations. Both ankles are swollen. Pt states LLE more swollen than R.

## 2022-09-14 DIAGNOSIS — E8770 Fluid overload, unspecified: Secondary | ICD-10-CM | POA: Diagnosis present

## 2022-09-14 DIAGNOSIS — Z6831 Body mass index (BMI) 31.0-31.9, adult: Secondary | ICD-10-CM | POA: Diagnosis not present

## 2022-09-14 DIAGNOSIS — I132 Hypertensive heart and chronic kidney disease with heart failure and with stage 5 chronic kidney disease, or end stage renal disease: Secondary | ICD-10-CM | POA: Diagnosis present

## 2022-09-14 DIAGNOSIS — I5022 Chronic systolic (congestive) heart failure: Secondary | ICD-10-CM | POA: Diagnosis present

## 2022-09-14 DIAGNOSIS — Z87891 Personal history of nicotine dependence: Secondary | ICD-10-CM | POA: Diagnosis not present

## 2022-09-14 DIAGNOSIS — Z85528 Personal history of other malignant neoplasm of kidney: Secondary | ICD-10-CM | POA: Diagnosis not present

## 2022-09-14 DIAGNOSIS — D631 Anemia in chronic kidney disease: Secondary | ICD-10-CM | POA: Diagnosis present

## 2022-09-14 DIAGNOSIS — Z794 Long term (current) use of insulin: Secondary | ICD-10-CM | POA: Diagnosis not present

## 2022-09-14 DIAGNOSIS — E1165 Type 2 diabetes mellitus with hyperglycemia: Secondary | ICD-10-CM | POA: Diagnosis present

## 2022-09-14 DIAGNOSIS — N2581 Secondary hyperparathyroidism of renal origin: Secondary | ICD-10-CM | POA: Diagnosis present

## 2022-09-14 DIAGNOSIS — Z66 Do not resuscitate: Secondary | ICD-10-CM | POA: Diagnosis present

## 2022-09-14 DIAGNOSIS — I998 Other disorder of circulatory system: Secondary | ICD-10-CM | POA: Diagnosis present

## 2022-09-14 DIAGNOSIS — J449 Chronic obstructive pulmonary disease, unspecified: Secondary | ICD-10-CM | POA: Diagnosis present

## 2022-09-14 DIAGNOSIS — N186 End stage renal disease: Secondary | ICD-10-CM | POA: Diagnosis present

## 2022-09-14 DIAGNOSIS — E1122 Type 2 diabetes mellitus with diabetic chronic kidney disease: Secondary | ICD-10-CM | POA: Diagnosis present

## 2022-09-14 DIAGNOSIS — E440 Moderate protein-calorie malnutrition: Secondary | ICD-10-CM | POA: Diagnosis present

## 2022-09-14 DIAGNOSIS — Z820 Family history of epilepsy and other diseases of the nervous system: Secondary | ICD-10-CM | POA: Diagnosis not present

## 2022-09-14 DIAGNOSIS — Z833 Family history of diabetes mellitus: Secondary | ICD-10-CM | POA: Diagnosis not present

## 2022-09-14 DIAGNOSIS — Z91158 Patient's noncompliance with renal dialysis for other reason: Secondary | ICD-10-CM | POA: Diagnosis not present

## 2022-09-14 DIAGNOSIS — Z91A58 Caregiver's noncompliance with patient's renal dialysis for other reason: Secondary | ICD-10-CM | POA: Diagnosis not present

## 2022-09-14 DIAGNOSIS — Z992 Dependence on renal dialysis: Secondary | ICD-10-CM | POA: Diagnosis not present

## 2022-09-14 DIAGNOSIS — D61818 Other pancytopenia: Secondary | ICD-10-CM | POA: Diagnosis present

## 2022-09-14 DIAGNOSIS — Z800 Family history of malignant neoplasm of digestive organs: Secondary | ICD-10-CM | POA: Diagnosis not present

## 2022-09-14 DIAGNOSIS — Z8249 Family history of ischemic heart disease and other diseases of the circulatory system: Secondary | ICD-10-CM | POA: Diagnosis not present

## 2022-09-14 LAB — BASIC METABOLIC PANEL
Anion gap: 15 (ref 5–15)
BUN: 54 mg/dL — ABNORMAL HIGH (ref 8–23)
CO2: 28 mmol/L (ref 22–32)
Calcium: 9.1 mg/dL (ref 8.9–10.3)
Chloride: 100 mmol/L (ref 98–111)
Creatinine, Ser: 8.23 mg/dL — ABNORMAL HIGH (ref 0.61–1.24)
GFR, Estimated: 6 mL/min — ABNORMAL LOW (ref >60)
Glucose, Bld: 116 mg/dL — ABNORMAL HIGH (ref 70–99)
Potassium: 3.9 mmol/L (ref 3.5–5.1)
Sodium: 143 mmol/L (ref 135–145)

## 2022-09-14 LAB — GLUCOSE, CAPILLARY
Glucose-Capillary: 114 mg/dL — ABNORMAL HIGH (ref 70–99)
Glucose-Capillary: 153 mg/dL — ABNORMAL HIGH (ref 70–99)
Glucose-Capillary: 181 mg/dL — ABNORMAL HIGH (ref 70–99)
Glucose-Capillary: 252 mg/dL — ABNORMAL HIGH (ref 70–99)

## 2022-09-14 LAB — CBC
HCT: 29.4 % — ABNORMAL LOW (ref 39.0–52.0)
Hemoglobin: 9.3 g/dL — ABNORMAL LOW (ref 13.0–17.0)
MCH: 31.7 pg (ref 26.0–34.0)
MCHC: 31.6 g/dL (ref 30.0–36.0)
MCV: 100.3 fL — ABNORMAL HIGH (ref 80.0–100.0)
Platelets: 103 10*3/uL — ABNORMAL LOW (ref 150–400)
RBC: 2.93 MIL/uL — ABNORMAL LOW (ref 4.22–5.81)
RDW: 14.1 % (ref 11.5–15.5)
WBC: 3.8 10*3/uL — ABNORMAL LOW (ref 4.0–10.5)
nRBC: 0 % (ref 0.0–0.2)

## 2022-09-14 LAB — HEPATITIS B SURFACE ANTIGEN: Hepatitis B Surface Ag: NONREACTIVE

## 2022-09-14 MED ORDER — SEVELAMER CARBONATE 800 MG PO TABS
2400.0000 mg | ORAL_TABLET | Freq: Three times a day (TID) | ORAL | Status: DC
  Administered 2022-09-14 – 2022-09-15 (×3): 2400 mg via ORAL
  Filled 2022-09-14 (×2): qty 3

## 2022-09-14 MED ORDER — NEPRO/CARBSTEADY PO LIQD: 237.0000 mL | Freq: Three times a day (TID) | ORAL | Status: DC

## 2022-09-14 MED ORDER — HYDRALAZINE HCL 20 MG/ML IJ SOLN: 10.0000 mg | Freq: Four times a day (QID) | INTRAMUSCULAR | Status: DC | PRN

## 2022-09-14 MED ORDER — RENA-VITE PO TABS
1.0000 | ORAL_TABLET | Freq: Every day | ORAL | Status: DC
  Administered 2022-09-14: 1 via ORAL
  Filled 2022-09-14: qty 1

## 2022-09-14 MED ORDER — LOPERAMIDE HCL 2 MG PO CAPS
4.0000 mg | ORAL_CAPSULE | ORAL | Status: DC | PRN
  Administered 2022-09-14 – 2022-09-15 (×3): 4 mg via ORAL
  Filled 2022-09-14 (×3): qty 2

## 2022-09-14 NOTE — Progress Notes (Signed)
Pt hooked up for treatment then flow balance error on machine.  Tx had yet to begin.  Rinsed back pt and new machine to be set up.  Machine pulled from floor, new machine in place.

## 2022-09-14 NOTE — Progress Notes (Signed)
PROGRESS NOTE    Timothy Gibson  [id number] DOB: 1951/01/23 DOA: 09/13/2022 PCP: Jon Billings, NP    Brief Narrative:  72 y.o. male with medical history significant of ESRD-HD (MWF), hypertension, diabetes mellitus, COPD, CHF with EF of 40-45%, right kidney carcinoma, who presents with worsening leg edema, shortness breath, weight gain.     Pt states that he has progressively worsening bilateral leg edema in the past 6 weeks, with mild shortness breath, mild intermittent dry cough, denies chest pain, fever or chills.  Patient has gained more than 5 pounds recently.  Denies nausea vomiting, diarrhea or abdominal pain.  No symptoms of UTI.  Patient states that has only been able to tolerate partial dialysis sessions since past 6 weeks since his blood pressure has been fluctuating during the dialysis, sometimes with low blood pressure and other times with very high blood pressure.  Patient undergo inpatient hemodialysis 3/7   Assessment & Plan:   Principal Problem:   Fluid overload Active Problems:   End stage renal disease (HCC)   Chronic systolic CHF (congestive heart failure) (HCC)   COPD (chronic obstructive pulmonary disease) (HCC)   Pancytopenia (HCC)   Type II diabetes mellitus with renal manifestations (HCC)   HTN (hypertension)  Fluid overload and end stage renal disease (on HD- MWF):  Patient has worsening leg edema, weight gain, elevated BNP, positive JVD, clinically consistent with fluid overload.  Oxygen saturation 93-100% on room air.  Consulted Dr. Juleen China of renal for dialysis Plan: HD today UF as tolerated Admit inpatient with plan for repeat HD tomorrow 3/8  Chronic systolic CHF (congestive heart failure) ([place])  2D echo 03/22/2017 showed EF of 40-45%.  Patient has fluid overload -Volume management per renal by dialysis   COPD (chronic obstructive pulmonary disease) (HCC) -Bronchodilators and as needed Mucinex   Pancytopenia ([facility])  This is chronic  issue.  Hemoglobin 12.1 on 11/17/2021 --> 9.0 today. -Check anemia panel, reassuring   Type II diabetes mellitus with renal manifestations (HCC)  Recent A1c 9.6, poorly controlled.  Patient is taking glargine insulin 20 units daily - SSI -Glargine insulin 15 units daily   HTN (hypertension):  Patient's blood pressure has been fluctuating He reports hypotension precluding adequate fluid removal on previous hemodialysis sessions -IV hydralazine as needed for SBP > 165 -Metoprolol, Entresto -Vitals per unit protocol.  May need augmentation/increase in blood pressure regimen  DVT prophylaxis: SQ heparin Code Status: DNR Family Communication: None today Disposition Plan: Status is: Inpatient Remains inpatient appropriate because: Refractory fluid overload, ESRD.  Possible discharge 3/8   Level of care: Med-Surg  Consultants:  Nephrology  Procedures:  None  Antimicrobials: None   Subjective: Seen and examined.  Sitting up in chair.  No apparent distress.  Reports some fatigue but otherwise asymptomatic.  Objective: Vitals:   09/14/22 1130 09/14/22 1200 09/14/22 1230 09/14/22 1300  BP: (!) 207/81 (!) 157/58 (!) 165/56 (!) 195/75  Pulse: 80 87 82 87  Resp: '19 20 14 19  '$ Temp:      TempSrc:      SpO2: 97% 99% 99% 100%  Weight:      Height:        Intake/Output Summary (Last 24 hours) at 09/14/2022 1323 Last data filed at 09/13/2022 2000 Gross per 24 hour  Intake 100 ml  Output --  Net 100 ml   Filed Weights   09/13/22 1158 09/14/22 0916  Weight: 111.6 kg 119.5 kg    Examination:  General exam: Appears calm  and comfortable  Respiratory system: Bibasilar crackles.  Normal work of breathing.  Room air Cardiovascular system: S1-S2, RRR, no murmurs, 3+ pitting edema BLE Gastrointestinal system: Soft, NT/ND, normal bowel sounds Central nervous system: Alert and oriented. No focal neurological deficits. Extremities: Symmetric 5 x 5 power. Skin: No rashes, lesions or  ulcers Psychiatry: Judgement and insight appear normal. Mood & affect appropriate.     Data Reviewed: I have personally reviewed following labs and imaging studies  CBC: Recent Labs  Lab 09/13/22 1204 09/14/22 0422  WBC 3.1* 3.8*  HGB 9.0* 9.3*  HCT 28.4* 29.4*  MCV 100.4* 100.3*  PLT 114* [id number]*   Basic Metabolic Panel: Recent Labs  Lab 09/13/22 1204 09/14/22 0422  NA 139 143  K 3.7 3.9  CL 98 100  CO2 31 28  GLUCOSE 280* 116*  BUN 42* 54*  CREATININE 6.88* 8.23*  CALCIUM 8.8* 9.1   GFR: Estimated Creatinine Clearance: 11.6 mL/min (A) (by C-G formula based on SCr of 8.23 mg/dL (H)). Liver Function Tests: No results for input(s): "AST", "ALT", "ALKPHOS", "BILITOT", "PROT", "ALBUMIN" in the last 168 hours. No results for input(s): "LIPASE", "AMYLASE" in the last 168 hours. No results for input(s): "AMMONIA" in the last 168 hours. Coagulation Profile: No results for input(s): "INR", "PROTIME" in the last 168 hours. Cardiac Enzymes: No results for input(s): "CKTOTAL", "CKMB", "CKMBINDEX", "TROPONINI" in the last 168 hours. BNP (last 3 results) No results for input(s): "PROBNP" in the last 8760 hours. HbA1C: No results for input(s): "HGBA1C" in the last 72 hours. CBG: Recent Labs  Lab 09/13/22 2114 09/14/22 0735  GLUCAP 314* 114*   Lipid Profile: No results for input(s): "CHOL", "HDL", "LDLCALC", "TRIG", "CHOLHDL", "LDLDIRECT" in the last 72 hours. Thyroid Function Tests: No results for input(s): "TSH", "T4TOTAL", "FREET4", "T3FREE", "THYROIDAB" in the last 72 hours. Anemia Panel: Recent Labs    09/13/22 1802  VITAMINB12 275  FOLATE 9.8  FERRITIN 1,040*  TIBC 168*  IRON 51  RETICCTPCT 1.7   Sepsis Labs: No results for input(s): "PROCALCITON", "LATICACIDVEN" in the last 168 hours.  Recent Results (from the past 240 hour(s))  MRSA Next Gen by PCR, Nasal     Status: None   Collection Time: 09/13/22  4:28 PM   Specimen: Nasal Mucosa; Nasal Swab  Result  Value Ref Range Status   MRSA by PCR Next Gen NOT DETECTED NOT DETECTED Final    Comment: (NOTE) The GeneXpert MRSA Assay (FDA approved for NASAL specimens only), is one component of a comprehensive MRSA colonization surveillance program. It is not intended to diagnose MRSA infection nor to guide or monitor treatment for MRSA infections. Test performance is not FDA approved in patients less than 71 years old. Performed at [facility], [address] [place], [place] [address]          Radiology Studies: DG Chest 2 View  Result Date: 09/13/2022 CLINICAL DATA:  Lower extremity edema EXAM: CHEST - 2 VIEW COMPARISON:  Chest x-ray dated August 24, 2022 FINDINGS: The heart size and mediastinal contours are within normal limits. Elevation of the right hemidiaphragm. Right basilar atelectasis. Both lungs are otherwise clear. The visualized skeletal structures are unremarkable. IMPRESSION: No active cardiopulmonary disease. Electronically Signed   By: Yetta Glassman M.D.   On: 09/13/2022 13:01        Scheduled Meds:  Chlorhexidine Gluconate Cloth  6 each Topical Q0600   heparin  5,000 Units Subcutaneous Q8H   insulin aspart  0-5 Units Subcutaneous  QHS   insulin aspart  0-9 Units Subcutaneous TID WC   insulin glargine-yfgn  15 Units Subcutaneous QHS   metoprolol succinate  25 mg Oral Daily   sacubitril-valsartan  1 tablet Oral Q12H   sevelamer carbonate  2,400 mg Oral TID WC   Continuous Infusions:  anticoagulant sodium citrate       LOS: 0 days     Sidney Ace, MD [facility]   If 7PM-7AM, please contact night-coverage  09/14/2022, 1:23 PM

## 2022-09-14 NOTE — Plan of Care (Signed)

## 2022-09-14 NOTE — Progress Notes (Signed)
Brief note, full note to follow.  72 year old male sent to the ED for weight gain in the setting of ESRD/fluid overload.  Nephrology consulted on admission.  Patient reported having issues with hemodialysis due to significant swings in blood pressure preventing effective ultrafiltration.  Patient supposedly 5 kg above dry weight.  Further management after dialysis today.  Ralene Muskrat MD  No charge

## 2022-09-14 NOTE — Progress Notes (Signed)
Initial Nutrition Assessment  DOCUMENTATION CODES:   Non-severe (moderate) malnutrition in context of chronic illness  INTERVENTION:   Nepro Shake po TID, each supplement provides 425 kcal and 19 grams protein  Rena-vite po daily   Liberalize diet   Daily weights   NUTRITION DIAGNOSIS:   Moderate Malnutrition related to chronic illness (ESRD on HD, COPD) as evidenced by moderate fat depletion, moderate muscle depletion.  GOAL:   Patient will meet greater than or equal to 90% of their needs  MONITOR:   PO intake, Supplement acceptance, Labs, Weight trends, I & O's, Skin  REASON FOR ASSESSMENT:   Malnutrition Screening Tool    ASSESSMENT:   72 y/o male with h/o ESRD on HD, CHF, COPD, DM, HTN, BPH and pancytopenia who is admitted with volume overload.  Met with pt in room today. Pt reports decreased oral intake at baseline. Pt reports that a typical day for him would be a cooked breakfast (by his caretaker) and then he may go to [employer] for dinner. Pt does not drink supplements at home but reports that he does eat the protein bars. Pt does not take any renal multivitamin. Pt reports that today, he only ate the meat from off of his meal tray. Pt reports that he does not like the hospital food much. RD discussed with pt the importance of adequate nutrition needed to preserve lean muscle and replace losses from HD. Recommend daily renal multivitamin at bedtime. RD will add supplements and vitamins to help pt meet his estimated needs (prefers mixed berry). RD will also liberalize pt's diet as a heart healthy diet is restrictive of protein. Pt is at refeed risk. Pt reports his dry weight is around 114kg. Per chart, pt is down ~13lbs since admission but appears to be up ~6lbs from his dry weight. Pt with BLE swelling which is improved today.   Medications reviewed and include: heparin, insulin, renvela  Labs reviewed: K 3.9 wnl, BUN 54(H), creat 8.23(H) BNP- 491.7(H)-  3/6 Wbc- 3.8(L), Hgb 9.3(L), Hct 29.4(L) Cbgs- 181, 153, 114 x 24 hrs  AIC 9.6(H)- 05/2022  NUTRITION - FOCUSED PHYSICAL EXAM:  Flowsheet Row Most Recent Value  Orbital Region Mild depletion  Upper Arm Region Moderate depletion  Thoracic and Lumbar Region Mild depletion  Buccal Region Mild depletion  Temple Region Moderate depletion  Clavicle Bone Region Moderate depletion  Clavicle and Acromion Bone Region Moderate depletion  Scapular Bone Region Mild depletion  Dorsal Hand Moderate depletion  Patellar Region Unable to assess  Anterior Thigh Region Unable to assess  Posterior Calf Region Unable to assess  Edema (RD Assessment) Severe  Hair Reviewed  Eyes Reviewed  Mouth Reviewed  Skin Reviewed  Nails Reviewed   Diet Order:   Diet Order             Diet 2 gram sodium Room service appropriate? Yes; Fluid consistency: Thin; Fluid restriction: 1500 mL Fluid  Diet effective now                  EDUCATION NEEDS:   Education needs have been addressed  Skin:  Skin Assessment: Reviewed RN Assessment  Last BM:  3/7  Height:   Ht Readings from Last 1 Encounters:  09/13/22 '6\' 4"'$  (1.93 m)    Weight:   Wt Readings from Last 1 Encounters:  09/14/22 116.8 kg    Ideal Body Weight:  91.8 kg  BMI:  Body mass index is 31.34 kg/m.  Estimated Nutritional Needs:  Kcal:  2700-3000kcal/day  Protein:  >135g/day  Fluid:  UOP +1L  Koleen Distance MS, RD, LDN Please refer to [facility] for RD and/or RD on-call/weekend/after hours pager

## 2022-09-14 NOTE — Discharge Planning (Signed)
ESTABLISHED HEMODIALYSIS Outpatient facility: [facility]  [address] [place], [place] [address] [phone]  Scheduled days: Monday Wednesday and Friday   Treatment time: 5:45am

## 2022-09-14 NOTE — Progress Notes (Signed)
[facility]  ROUNDING NOTE   Subjective:   Timothy Gibson Is a 72 y.o. male with past medical history including hypertension, diabetes, CHF, COPD and end stage renal disease on hemodialysis. Patient presents to the ED at the advice of his dialysis clinic. He has been admitted for ESRD (end stage renal disease) ([place]) [N18.6] Fluid overload [E87.70]  Patient is known to our practice and received outpatient dialysis treatment at [facility] on a MWF schedule, supervised by Dr Holley Raring. Last treatment completed on Wednesday. Patient has not missed any recent dialysis treatments. He has been scheduled to receive extra dialysis outpatient and maintained all appts. However patient has been noted to terminate treatments early. He is seen and evaluated on dialysis    HEMODIALYSIS FLOWSHEET:  Blood Flow Rate (mL/min): 350 mL/min Arterial Pressure (mmHg): -160 mmHg Venous Pressure (mmHg): 210 mmHg TMP (mmHg): -4 mmHg Ultrafiltration Rate (mL/min): 1147 mL/min Dialysate Flow Rate (mL/min): 0 ml/min  He remains on room air, massive lower extremity edema noted.   Dialysis today, UF only treatment   Objective:  Vital signs in last 24 hours:  Temp:  [97.6 F (36.4 C)-98.3 F (36.8 C)] 98.3 F (36.8 C) (03/07 0916) Pulse Rate:  [72-87] 77 (03/07 1330) Resp:  [13-25] 18 (03/07 1330) BP: (140-208)/(31-96) 179/80 (03/07 1330) SpO2:  [94 %-100 %] 99 % (03/07 1330) Weight:  [119.5 kg] 119.5 kg (03/07 0916)  Weight change:  Filed Weights   09/13/22 1158 09/14/22 0916  Weight: 111.6 kg 119.5 kg    Intake/Output: I/O last 3 completed shifts: In: 100 [P.O.:100] Out: -    Intake/Output this shift:  No intake/output data recorded.  Physical Exam: General: NAD  Head: Normocephalic, atraumatic. Moist oral mucosal membranes  Eyes: Anicteric  Lungs:  Clear to auscultation, normal effort  Heart: Regular rate and rhythm  Abdomen:  Soft, nontender,   Extremities:  3+  peripheral edema.  Neurologic: Nonfocal, moving all four extremities  Skin: No lesions  Access: Lt AVF    Basic Metabolic Panel: Recent Labs  Lab 09/13/22 1204 09/14/22 0422  NA 139 143  K 3.7 3.9  CL 98 100  CO2 31 28  GLUCOSE 280* 116*  BUN 42* 54*  CREATININE 6.88* 8.23*  CALCIUM 8.8* 9.1    Liver Function Tests: No results for input(s): "AST", "ALT", "ALKPHOS", "BILITOT", "PROT", "ALBUMIN" in the last 168 hours. No results for input(s): "LIPASE", "AMYLASE" in the last 168 hours. No results for input(s): "AMMONIA" in the last 168 hours.  CBC: Recent Labs  Lab 09/13/22 1204 09/14/22 0422  WBC 3.1* 3.8*  HGB 9.0* 9.3*  HCT 28.4* 29.4*  MCV 100.4* 100.3*  PLT 114* 103*    Cardiac Enzymes: No results for input(s): "CKTOTAL", "CKMB", "CKMBINDEX", "TROPONINI" in the last 168 hours.  BNP: Invalid input(s): "POCBNP"  CBG: Recent Labs  Lab 09/13/22 2114 09/14/22 0735  GLUCAP 314* 114*    Microbiology: Results for orders placed or performed during the hospital encounter of 09/13/22  MRSA Next Gen by PCR, Nasal     Status: None   Collection Time: 09/13/22  4:28 PM   Specimen: Nasal Mucosa; Nasal Swab  Result Value Ref Range Status   MRSA by PCR Next Gen NOT DETECTED NOT DETECTED Final    Comment: (NOTE) The GeneXpert MRSA Assay (FDA approved for NASAL specimens only), is one component of a comprehensive MRSA colonization surveillance program. It is not intended to diagnose MRSA infection nor to guide or monitor treatment  for MRSA infections. Test performance is not FDA approved in patients less than 58 years old. Performed at [facility], [address] [place], [place] [address]     Coagulation Studies: No results for input(s): "LABPROT", "INR" in the last 72 hours.  Urinalysis: No results for input(s): "COLORURINE", "LABSPEC", "PHURINE", "GLUCOSEU", "HGBUR", "BILIRUBINUR", "KETONESUR", "PROTEINUR", "UROBILINOGEN", "NITRITE",  "LEUKOCYTESUR" in the last 72 hours.  Invalid input(s): "APPERANCEUR"    Imaging: DG Chest 2 View  Result Date: 09/13/2022 CLINICAL DATA:  Lower extremity edema EXAM: CHEST - 2 VIEW COMPARISON:  Chest x-ray dated August 24, 2022 FINDINGS: The heart size and mediastinal contours are within normal limits. Elevation of the right hemidiaphragm. Right basilar atelectasis. Both lungs are otherwise clear. The visualized skeletal structures are unremarkable. IMPRESSION: No active cardiopulmonary disease. Electronically Signed   By: Yetta Glassman M.D.   On: 09/13/2022 13:01     Medications:    anticoagulant sodium citrate      Chlorhexidine Gluconate Cloth  6 each Topical Q0600   heparin  5,000 Units Subcutaneous Q8H   insulin aspart  0-5 Units Subcutaneous QHS   insulin aspart  0-9 Units Subcutaneous TID WC   insulin glargine-yfgn  15 Units Subcutaneous QHS   metoprolol succinate  25 mg Oral Daily   sacubitril-valsartan  1 tablet Oral Q12H   sevelamer carbonate  2,400 mg Oral TID WC   acetaminophen, albuterol, alteplase, anticoagulant sodium citrate, dextromethorphan-guaiFENesin, diphenhydrAMINE, heparin, hydrALAZINE, lidocaine (PF), lidocaine-prilocaine, pentafluoroprop-tetrafluoroeth, traZODone  Assessment/ Plan:  Timothy Gibson is a 72 y.o.  male  Timothy Gibson Is a 72 y.o. male with past medical history including hypertension, diabetes, CHF, COPD and end stage renal disease on hemodialysis. Patient presents to the ED at the advice of his dialysis clinic. He has been admitted for ESRD (end stage renal disease) ([place]) [N18.6] Fluid overload [E87.70]   End stage renal disease on hemodialysis. Last treatment received on Wednesday. Will perform UF only treatment today, UF 3L as tolerated. Will perform daily dialysis to optimize fluid status. Next treatment scheduled for Friday. Patient aware and agreeable to plan.  2. Anemia of chronic kidney disease Lab Results  Component  Value Date   HGB 9.3 (L) 09/14/2022   Hgb within optimal range. Patient receives Mircera at outpatient clinic.   3. Diabetes mellitus type II with chronic kidney disease/renal manifestations: insulin dependent. Home regimen includes Glargine. Most recent hemoglobin A1c is 9.6 on 05/25/22.   4. Secondary Hyperparathyroidism: with outpatient labs:  phosphorus 8.0, calcium 8.9 on 11/14/21.   Lab Results  Component Value Date   CALCIUM 9.1 09/14/2022   PHOS 5.1 (H) 03/21/2017    Bone minerals within desired range.    LOS: 0 Timothy Gibson 3/7/20241:48 PM

## 2022-09-15 DIAGNOSIS — E8770 Fluid overload, unspecified: Secondary | ICD-10-CM | POA: Diagnosis not present

## 2022-09-15 LAB — RENAL FUNCTION PANEL
Albumin: 3 g/dL — ABNORMAL LOW (ref 3.5–5.0)
Anion gap: 13 (ref 5–15)
BUN: 67 mg/dL — ABNORMAL HIGH (ref 8–23)
CO2: 26 mmol/L (ref 22–32)
Calcium: 9.1 mg/dL (ref 8.9–10.3)
Chloride: 102 mmol/L (ref 98–111)
Creatinine, Ser: 10.32 mg/dL — ABNORMAL HIGH (ref 0.61–1.24)
GFR, Estimated: 5 mL/min — ABNORMAL LOW (ref >60)
Glucose, Bld: 67 mg/dL — ABNORMAL LOW (ref 70–99)
Phosphorus: 7.6 mg/dL — ABNORMAL HIGH (ref 2.5–4.6)
Potassium: 3.7 mmol/L (ref 3.5–5.1)
Sodium: 141 mmol/L (ref 135–145)

## 2022-09-15 LAB — CBC
HCT: 28.4 % — ABNORMAL LOW (ref 39.0–52.0)
Hemoglobin: 9 g/dL — ABNORMAL LOW (ref 13.0–17.0)
MCH: 31.8 pg (ref 26.0–34.0)
MCHC: 31.7 g/dL (ref 30.0–36.0)
MCV: 100.4 fL — ABNORMAL HIGH (ref 80.0–100.0)
Platelets: 126 10*3/uL — ABNORMAL LOW (ref 150–400)
RBC: 2.83 MIL/uL — ABNORMAL LOW (ref 4.22–5.81)
RDW: 14 % (ref 11.5–15.5)
WBC: 3.6 10*3/uL — ABNORMAL LOW (ref 4.0–10.5)
nRBC: 0 % (ref 0.0–0.2)

## 2022-09-15 LAB — HEPATITIS B E ANTIBODY: Hep B E Ab: NEGATIVE

## 2022-09-15 LAB — GLUCOSE, CAPILLARY
Glucose-Capillary: 74 mg/dL (ref 70–99)
Glucose-Capillary: 109 mg/dL — ABNORMAL HIGH (ref 70–99)

## 2022-09-15 LAB — HEPATITIS B SURFACE ANTIBODY, QUANTITATIVE: Hep B S AB Quant (Post): 6.9 m[IU]/mL — ABNORMAL LOW (ref >9.9)

## 2022-09-15 NOTE — Progress Notes (Addendum)
Patient reports headaches and blurring of vision. Offered Tylenol but patient is refusing. UF paused. Glucose:107 . HD MD notified.

## 2022-09-15 NOTE — Discharge Summary (Signed)
Physician Discharge Summary  CASTER WALDINGER [id number] DOB: 1950/12/01 DOA: 09/13/2022  PCP: Jon Billings, NP  Admit date: 09/13/2022 Discharge date: 09/15/2022  Admitted From: Home Disposition:  Home  Recommendations for Outpatient Follow-up:  Follow up with PCP in 1-2 weeks Follow up with cardiology 1 week, recommend repeat TTE Follow up with nephrology as directed  Home Health:No  Equipment/Devices:None   Discharge Condition:Stable  CODE STATUS:FULL  Diet recommendation: Heart healthy, fluid restrict  Brief/Interim Summary:  72 y.o. male with medical history significant of ESRD-HD (MWF), hypertension, diabetes mellitus, COPD, CHF with EF of 40-45%, right kidney carcinoma, who presents with worsening leg edema, shortness breath, weight gain.     Pt states that he has progressively worsening bilateral leg edema in the past 6 weeks, with mild shortness breath, mild intermittent dry cough, denies chest pain, fever or chills.  Patient has gained more than 5 pounds recently.  Denies nausea vomiting, diarrhea or abdominal pain.  No symptoms of UTI.  Patient states that has only been able to tolerate partial dialysis sessions since past 6 weeks since his blood pressure has been fluctuating during the dialysis, sometimes with low blood pressure and other times with very high blood pressure.   Patient successfully dialyzed 3/7 and 3/8.  1.6 L ultrafiltration on 3/8.  Patient came lightheaded and UF was turned off the rest of treatment.  On discharge 115.5 kg will be new dialysis target weight.  Discussed with patient's outpatient nephrologist's who indicated echocardiogram would be useful for further assessment of volume status and cardiomyopathy.  Will defer inpatient study and attempt to have this patient follow-up with his established cardiologist echo to clinic for echocardiogram outpatient.  Discharge Diagnoses:  Principal Problem:   Fluid overload Active Problems:   End stage  renal disease (HCC)   Chronic systolic CHF (congestive heart failure) (HCC)   COPD (chronic obstructive pulmonary disease) (HCC)   Pancytopenia (HCC)   Type II diabetes mellitus with renal manifestations (HCC)   HTN (hypertension)   Malnutrition of moderate degree  Expand All Collapse All   PROGRESS NOTE       KAIZER THOME     [id number] DOB: Dec 02, 1950 DOA: 09/13/2022 PCP: Jon Billings, NP      Brief Narrative:  72 y.o. male with medical history significant of ESRD-HD (MWF), hypertension, diabetes mellitus, COPD, CHF with EF of 40-45%, right kidney carcinoma, who presents with worsening leg edema, shortness breath, weight gain.     Pt states that he has progressively worsening bilateral leg edema in the past 6 weeks, with mild shortness breath, mild intermittent dry cough, denies chest pain, fever or chills.  Patient has gained more than 5 pounds recently.  Denies nausea vomiting, diarrhea or abdominal pain.  No symptoms of UTI.  Patient states that has only been able to tolerate partial dialysis sessions since past 6 weeks since his blood pressure has been fluctuating during the dialysis, sometimes with low blood pressure and other times with very high blood pressure.   Patient undergo inpatient hemodialysis 3/7     Assessment & Plan:   Principal Problem:   Fluid overload Active Problems:   End stage renal disease (HCC)   Chronic systolic CHF (congestive heart failure) (HCC)   COPD (chronic obstructive pulmonary disease) (HCC)   Pancytopenia (HCC)   Type II diabetes mellitus with renal manifestations (HCC)   HTN (hypertension)   Fluid overload and end stage renal disease (on HD- MWF):  Patient has worsening leg edema,  weight gain, elevated BNP, positive JVD, clinically consistent with fluid overload.  Oxygen saturation 93-100% on room air.  Consulted Dr. Juleen China of renal for dialysis Plan: Discharged home.  Hemodialysis successfully completed 3/73/8.   Chronic  systolic CHF (congestive heart failure) ([place])  2D echo 03/22/2017 showed EF of 40-45%.  Patient has fluid overload -Volume management per renal by dialysis -Patient will require repeat echocardiogram.  Will attempt to make patient appointment at [facility] clinic cardiology within 1 to 2 weeks of discharge for repeat outpatient echo.       Discharge Instructions  Discharge Instructions     Diet - low sodium heart healthy   Complete by: As directed    Increase activity slowly   Complete by: As directed    No wound care   Complete by: As directed       Allergies as of 09/15/2022       Reactions   Adhesive [tape]    Pulls skin off.  Must use paper tape        Medication List     STOP taking these medications    Anoro Ellipta 62.5-25 MCG/ACT Aepb Generic drug: umeclidinium-vilanterol   traZODone 50 MG tablet Commonly known as: DESYREL       TAKE these medications    albuterol 108 (90 Base) MCG/ACT inhaler Commonly known as: VENTOLIN HFA Inhale 2 puffs into the lungs every 6 (six) hours as needed for wheezing or shortness of breath.   Basaglar KwikPen 100 UNIT/ML Inject 20 Units into the skin at bedtime.   metoprolol succinate 25 MG 24 hr tablet Commonly known as: TOPROL-XL Take 1 tablet (25 mg total) by mouth daily.   sacubitril-valsartan 24-26 MG Commonly known as: ENTRESTO Take 1 tablet by mouth every 12 (twelve) hours.   sevelamer carbonate 800 MG tablet Commonly known as: RENVELA Take 2,400 mg by mouth 3 (three) times daily with meals.        Follow-up Information     Jon Billings, NP. Schedule an appointment as soon as possible for a visit in 1 week(s).   Specialty: Nurse Practitioner Contact information: [place] [address] [phone]         Yolonda Kida, MD. Schedule an appointment as soon as possible for a visit in 1 week(s).   Specialties: Cardiology, Internal Medicine Contact information: [place] [address] [phone]                Allergies  Allergen Reactions   Adhesive [Tape]     Pulls skin off.  Must use paper tape    Consultations: Nephrology   Procedures/Studies: CT CHEST HIGH RESOLUTION  Result Date: 09/13/2022 CLINICAL DATA:  Interstitial lung disease EXAM: CT CHEST WITHOUT CONTRAST TECHNIQUE: Multidetector CT imaging of the chest was performed following the standard protocol without intravenous contrast. High resolution imaging of the lungs, as well as inspiratory and expiratory imaging, was performed. RADIATION DOSE REDUCTION: This exam was performed according to the departmental dose-optimization program which includes automated exposure control, adjustment of the mA and/or kV according to patient size and/or use of iterative reconstruction technique. COMPARISON:  Chest CT dated September 25, 2015 FINDINGS: Cardiovascular: Mild cardiomegaly. No pericardial effusion. Normal caliber thoracic aorta with severe atherosclerotic disease. Severe left main and three-vessel coronary artery calcifications. Mitral annular calcifications. Mediastinum/Nodes: Esophagus and thyroid are unremarkable. No pathologically enlarged lymph nodes seen in the chest. Lungs/Pleura: Nodule of the posterior wall of the trachea measuring  9 mm series 2, image 37. bibasilar atelectasis. Elevation of the right hemidiaphragm, new when compared to 2017 prior. Mild cylindrical bronchiectasis of the right lower lobe. No consolidation, pleural effusion or pneumothorax. Upper Abdomen: Numerous partially visualized cystic lesions of the left kidney, evaluation is limited due to lack of IV contrast streak artifact. No acute abnormality. Musculoskeletal: New lucent lesions located adjacent to the endplates and disc space height loss at T10-T11, likely due to Schmorl's nodes. No aggressive appearing osseous lesions. IMPRESSION: 1. Elevation of the right hemidiaphragm with right-greater-than-left  basilar atelectasis. No evidence of interstitial lung disease. 2. Nodule of the posterior wall of the trachea measuring 9 mm, possibly due to adherent mucus, although tracheal neoplasm is also a concern. Recommend follow-up chest CT in 3 months or bronchoscopy for further evaluation. 3. Right lower lobe bronchiectasis, likely sequela of infection or aspiration. 4. Numerous partially visualized cystic lesions of the left kidney, evaluation is limited due to lack of IV contrast and streak artifact. Renal protocol CT or MRI could be performed more complete evaluation. 5. Severe left main and three-vessel coronary artery calcifications. 6. Aortic Atherosclerosis (ICD10-I70.0). Electronically Signed   By: Yetta Glassman M.D.   On: 09/13/2022 13:50   DG Chest 2 View  Result Date: 09/13/2022 CLINICAL DATA:  Lower extremity edema EXAM: CHEST - 2 VIEW COMPARISON:  Chest x-ray dated August 24, 2022 FINDINGS: The heart size and mediastinal contours are within normal limits. Elevation of the right hemidiaphragm. Right basilar atelectasis. Both lungs are otherwise clear. The visualized skeletal structures are unremarkable. IMPRESSION: No active cardiopulmonary disease. Electronically Signed   By: Yetta Glassman M.D.   On: 09/13/2022 13:01   DG Chest 2 View  Result Date: 08/28/2022 CLINICAL DATA:  Short of breath. EXAM: CHEST - 2 VIEW COMPARISON:  10/28/2020.  CT, 09/25/2015. FINDINGS: Cardiac silhouette size. No mediastinal masses. No evidence of adenopathy. Elevated right hemidiaphragm. Stable linear opacities at the right base consistent atelectasis or scarring. Remainder of the lungs is clear. No pleural effusion pneumothorax. Skeletal structures are intact. IMPRESSION: No active cardiopulmonary disease. Electronically Signed   By: Lajean Manes M.D.   On: 08/28/2022 15:55      Subjective: Seen and examined on the day of discharge.  Stable no distress.  Stable for discharge home.  Discharge Exam: Vitals:    09/15/22 1155 09/15/22 1201  BP: (!) 140/47 (!) 136/37  Pulse: 87 83  Resp: 17 18  Temp:    SpO2: 99% 99%   Vitals:   09/15/22 1100 09/15/22 1130 09/15/22 1155 09/15/22 1201  BP: (!) 114/35 (!) 149/51 (!) 140/47 (!) 136/37  Pulse: 83 84 87 83  Resp: 16 (!) '21 17 18  '$ Temp:      TempSrc:    Oral  SpO2: 99% 98% 99% 99%  Weight:    115.6 kg  Height:        General: Pt is alert, awake, not in acute distress Cardiovascular: RRR, S1/S2 +, no rubs, no gallops Respiratory: CTA bilaterally, no wheezing, no rhonchi Abdominal: Soft, NT, ND, bowel sounds + Extremities: no edema, no cyanosis    The results of significant diagnostics from this hospitalization (including imaging, microbiology, ancillary and laboratory) are listed below for reference.     Microbiology: Recent Results (from the past 240 hour(s))  MRSA Next Gen by PCR, Nasal     Status: None   Collection Time: 09/13/22  4:28 PM   Specimen: Nasal Mucosa; Nasal Swab  Result Value Ref  Range Status   MRSA by PCR Next Gen NOT DETECTED NOT DETECTED Final    Comment: (NOTE) The GeneXpert MRSA Assay (FDA approved for NASAL specimens only), is one component of a comprehensive MRSA colonization surveillance program. It is not intended to diagnose MRSA infection nor to guide or monitor treatment for MRSA infections. Test performance is not FDA approved in patients less than 23 years old. Performed at [facility], [address] [place], [place] [address]      Labs: BNP (last 3 results) Recent Labs    09/13/22 1204  BNP 123XX123*   Basic Metabolic Panel: Recent Labs  Lab 09/13/22 1204 09/14/22 0422 09/15/22 0806  NA 139 143 141  K 3.7 3.9 3.7  CL 98 100 102  CO2 '31 28 26  '$ GLUCOSE 280* 116* 67*  BUN 42* 54* 67*  CREATININE 6.88* 8.23* 10.32*  CALCIUM 8.8* 9.1 9.1  PHOS  --   --  7.6*   Liver Function Tests: Recent Labs  Lab 09/15/22 0806  ALBUMIN 3.0*   No results for input(s): "LIPASE",  "AMYLASE" in the last 168 hours. No results for input(s): "AMMONIA" in the last 168 hours. CBC: Recent Labs  Lab 09/13/22 1204 09/14/22 0422 09/15/22 0806  WBC 3.1* 3.8* 3.6*  HGB 9.0* 9.3* 9.0*  HCT 28.4* 29.4* 28.4*  MCV 100.4* 100.3* 100.4*  PLT 114* 103* 126*   Cardiac Enzymes: No results for input(s): "CKTOTAL", "CKMB", "CKMBINDEX", "TROPONINI" in the last 168 hours. BNP: Invalid input(s): "POCBNP" CBG: Recent Labs  Lab 09/14/22 1437 09/14/22 1628 09/14/22 2105 09/15/22 1013 09/15/22 1048  GLUCAP 153* 181* 252* 74 109*   D-Dimer No results for input(s): "DDIMER" in the last 72 hours. Hgb A1c No results for input(s): "HGBA1C" in the last 72 hours. Lipid Profile No results for input(s): "CHOL", "HDL", "LDLCALC", "TRIG", "CHOLHDL", "LDLDIRECT" in the last 72 hours. Thyroid function studies No results for input(s): "TSH", "T4TOTAL", "T3FREE", "THYROIDAB" in the last 72 hours.  Invalid input(s): "FREET3" Anemia work up Recent Labs    09/13/22 1802  VITAMINB12 275  FOLATE 9.8  FERRITIN 1,040*  TIBC 168*  IRON 51  RETICCTPCT 1.7   Urinalysis    Component Value Date/Time   COLORURINE Yellow 07/22/2011 1830   APPEARANCEUR Turbid 07/22/2011 1830   LABSPEC 1.012 07/22/2011 1830   PHURINE 5.0 07/22/2011 1830   GLUCOSEU >=500 07/22/2011 1830   HGBUR 2+ 07/22/2011 1830   BILIRUBINUR Negative 07/22/2011 1830   KETONESUR Negative 07/22/2011 1830   PROTEINUR 100 mg/dL 07/22/2011 1830   NITRITE Negative 07/22/2011 1830   LEUKOCYTESUR Trace 07/22/2011 1830   Sepsis Labs Recent Labs  Lab 09/13/22 1204 09/14/22 0422 09/15/22 0806  WBC 3.1* 3.8* 3.6*   Microbiology Recent Results (from the past 240 hour(s))  MRSA Next Gen by PCR, Nasal     Status: None   Collection Time: 09/13/22  4:28 PM   Specimen: Nasal Mucosa; Nasal Swab  Result Value Ref Range Status   MRSA by PCR Next Gen NOT DETECTED NOT DETECTED Final    Comment: (NOTE) The GeneXpert MRSA Assay  (FDA approved for NASAL specimens only), is one component of a comprehensive MRSA colonization surveillance program. It is not intended to diagnose MRSA infection nor to guide or monitor treatment for MRSA infections. Test performance is not FDA approved in patients less than 37 years old. Performed at [facility], [address], [place], [place] [address]      Time coordinating discharge: Over  30 minutes  SIGNED:   Sidney Ace, MD  [facility] 09/15/2022, 3:20 PM Pager   If 7PM-7AM, please contact night-coverage

## 2022-09-15 NOTE — Plan of Care (Signed)

## 2022-09-15 NOTE — Progress Notes (Signed)
Hemodialysis Note    Received patient in bed to unit. Alert and oriented.  Informed consent signed and in chart.     Treatment initiated: 0806  Treatment completed: 72    Transported back to the room alert, without acute distress. Report given to patient's RN.    Access used:LUA Fistula  Access issues:none     Total UF removed:1.6L  Medications given: none    Post HD weight: 115.6 kg   [place] Constable RN Kidney Dialysis Unit

## 2022-09-15 NOTE — Progress Notes (Signed)
[facility]  ROUNDING NOTE   Subjective:   Timothy Gibson Is a 72 y.o. male with past medical history including hypertension, diabetes, CHF, COPD and end stage renal disease on hemodialysis. Patient presents to the ED at the advice of his dialysis clinic. He has been admitted for ESRD (end stage renal disease) ([place]) [N18.6] Fluid overload [E87.70]  Patient is known to our practice and received outpatient dialysis treatment at [facility] on a MWF schedule, supervised by Dr Holley Raring.   Patient seen and evaluated during dialysis   HEMODIALYSIS FLOWSHEET:  Blood Flow Rate (mL/min): 350 mL/min Arterial Pressure (mmHg): -150 mmHg Venous Pressure (mmHg): 210 mmHg TMP (mmHg): 6 mmHg Ultrafiltration Rate (mL/min): 0 mL/min Dialysate Flow Rate (mL/min): 300 ml/min   UF only treatment yesterday, UF 3L achieved Initially tolerating treatment well today until complaints of blurred vision.    Objective:  Vital signs in last 24 hours:  Temp:  [97.4 F (36.3 C)-98.9 F (37.2 C)] 97.6 F (36.4 C) (03/08 0749) Pulse Rate:  [73-90] 84 (03/08 1130) Resp:  [11-21] 21 (03/08 1130) BP: (109-195)/(35-107) 149/51 (03/08 1130) SpO2:  [95 %-100 %] 98 % (03/08 1130) Weight:  [116.8 kg-118.4 kg] 116.9 kg (03/08 0746)  Weight change: 7.915 kg Filed Weights   09/14/22 1342 09/15/22 0542 09/15/22 0746  Weight: 116.8 kg 118.4 kg 116.9 kg    Intake/Output: I/O last 3 completed shifts: In: 340 [P.O.:340] Out: 3000 [Other:3000]   Intake/Output this shift:  No intake/output data recorded.  Physical Exam: General: NAD  Head: Normocephalic, atraumatic. Moist oral mucosal membranes  Eyes: Anicteric  Lungs:  Clear to auscultation, normal effort  Heart: Regular rate and rhythm  Abdomen:  Soft, nontender  Extremities:  1+-2+ peripheral edema.  Neurologic: Alert and oriented, moving all four extremities  Skin: No lesions  Access: Lt AVF    Basic Metabolic Panel: Recent Labs   Lab 09/13/22 1204 09/14/22 0422 09/15/22 0806  NA 139 143 141  K 3.7 3.9 3.7  CL 98 100 102  CO2 '31 28 26  '$ GLUCOSE 280* 116* 67*  BUN 42* 54* 67*  CREATININE 6.88* 8.23* 10.32*  CALCIUM 8.8* 9.1 9.1  PHOS  --   --  7.6*     Liver Function Tests: Recent Labs  Lab 09/15/22 0806  ALBUMIN 3.0*   No results for input(s): "LIPASE", "AMYLASE" in the last 168 hours. No results for input(s): "AMMONIA" in the last 168 hours.  CBC: Recent Labs  Lab 09/13/22 1204 09/14/22 0422 09/15/22 0806  WBC 3.1* 3.8* 3.6*  HGB 9.0* 9.3* 9.0*  HCT 28.4* 29.4* 28.4*  MCV 100.4* 100.3* 100.4*  PLT 114* 103* 126*     Cardiac Enzymes: No results for input(s): "CKTOTAL", "CKMB", "CKMBINDEX", "TROPONINI" in the last 168 hours.  BNP: Invalid input(s): "POCBNP"  CBG: Recent Labs  Lab 09/14/22 1437 09/14/22 1628 09/14/22 2105 09/15/22 1013 09/15/22 1048  GLUCAP 153* 181* 252* 74 109*     Microbiology: Results for orders placed or performed during the hospital encounter of 09/13/22  MRSA Next Gen by PCR, Nasal     Status: None   Collection Time: 09/13/22  4:28 PM   Specimen: Nasal Mucosa; Nasal Swab  Result Value Ref Range Status   MRSA by PCR Next Gen NOT DETECTED NOT DETECTED Final    Comment: (NOTE) The GeneXpert MRSA Assay (FDA approved for NASAL specimens only), is one component of a comprehensive MRSA colonization surveillance program. It is not intended to diagnose  MRSA infection nor to guide or monitor treatment for MRSA infections. Test performance is not FDA approved in patients less than 32 years old. Performed at [facility], [address] [place], [place] [address]     Coagulation Studies: No results for input(s): "LABPROT", "INR" in the last 72 hours.  Urinalysis: No results for input(s): "COLORURINE", "LABSPEC", "PHURINE", "GLUCOSEU", "HGBUR", "BILIRUBINUR", "KETONESUR", "PROTEINUR", "UROBILINOGEN", "NITRITE", "LEUKOCYTESUR" in the last 72  hours.  Invalid input(s): "APPERANCEUR"    Imaging: DG Chest 2 View  Result Date: 09/13/2022 CLINICAL DATA:  Lower extremity edema EXAM: CHEST - 2 VIEW COMPARISON:  Chest x-ray dated August 24, 2022 FINDINGS: The heart size and mediastinal contours are within normal limits. Elevation of the right hemidiaphragm. Right basilar atelectasis. Both lungs are otherwise clear. The visualized skeletal structures are unremarkable. IMPRESSION: No active cardiopulmonary disease. Electronically Signed   By: Yetta Glassman M.D.   On: 09/13/2022 13:01     Medications:    anticoagulant sodium citrate      Chlorhexidine Gluconate Cloth  6 each Topical Q0600   feeding supplement (NEPRO CARB STEADY)  237 mL Oral TID BM   heparin  5,000 Units Subcutaneous Q8H   insulin aspart  0-5 Units Subcutaneous QHS   insulin aspart  0-9 Units Subcutaneous TID WC   insulin glargine-yfgn  15 Units Subcutaneous QHS   metoprolol succinate  25 mg Oral Daily   multivitamin  1 tablet Oral QHS   sacubitril-valsartan  1 tablet Oral Q12H   sevelamer carbonate  2,400 mg Oral TID WC   acetaminophen, albuterol, alteplase, anticoagulant sodium citrate, dextromethorphan-guaiFENesin, diphenhydrAMINE, heparin, hydrALAZINE, lidocaine (PF), lidocaine-prilocaine, loperamide, pentafluoroprop-tetrafluoroeth, traZODone  Assessment/ Plan:  Mr. Timothy Gibson is a 72 y.o.  male  Timothy Gibson Is a 72 y.o. male with past medical history including hypertension, diabetes, CHF, COPD and end stage renal disease on hemodialysis. Patient presents to the ED at the advice of his dialysis clinic. He has been admitted for ESRD (end stage renal disease) ([place]) [N18.6] Fluid overload [E87.70]   End stage renal disease on hemodialysis.  UF only treatment yesterday, UF 3L removed. Receiving treatment today. UF reduced to 1.5-2L due to blurred vision and weakness. Patient believed to be at new weight goal. Will notify outpatient clinic of new dry  weight. Next treatment scheduled for Monday  Patient cleared to discharge from renal stance.   2. Anemia of chronic kidney disease Lab Results  Component Value Date   HGB 9.0 (L) 09/15/2022   Hgb acceptable. Patient receives Mircera at outpatient clinic.   3. Diabetes mellitus type II with chronic kidney disease/renal manifestations: insulin dependent. Home regimen includes Glargine. Most recent hemoglobin A1c is 9.6 on 05/25/22.   Glucose elevated last night. Primary team to manage SSI  4. Secondary Hyperparathyroidism: with outpatient labs:  phosphorus 8.0, calcium 8.9 on 11/14/21.   Lab Results  Component Value Date   CALCIUM 9.1 09/15/2022   PHOS 7.6 (H) 09/15/2022    Calcium at goal. Phosphorus remains elevated. Continue sevelamer with meals.   LOS: 1 Dontavian Marchi 3/8/202411:59 AM

## 2022-09-15 NOTE — TOC CM/SW Note (Signed)
Patient has orders to discharge home today. Chart reviewed. No TOC needs identified. CSW signing off.  Benna Arno, CSW [phone]  

## 2022-09-18 ENCOUNTER — Telehealth: Payer: Self-pay | Admitting: *Deleted

## 2022-09-18 DIAGNOSIS — D509 Iron deficiency anemia, unspecified: Secondary | ICD-10-CM | POA: Diagnosis not present

## 2022-09-18 DIAGNOSIS — N250 Renal osteodystrophy: Secondary | ICD-10-CM | POA: Diagnosis not present

## 2022-09-18 DIAGNOSIS — D631 Anemia in chronic kidney disease: Secondary | ICD-10-CM | POA: Diagnosis not present

## 2022-09-18 DIAGNOSIS — N186 End stage renal disease: Secondary | ICD-10-CM | POA: Diagnosis not present

## 2022-09-18 DIAGNOSIS — Z992 Dependence on renal dialysis: Secondary | ICD-10-CM | POA: Diagnosis not present

## 2022-09-18 DIAGNOSIS — N2581 Secondary hyperparathyroidism of renal origin: Secondary | ICD-10-CM | POA: Diagnosis not present

## 2022-09-18 NOTE — Transitions of Care (Post Inpatient/ED Visit) (Signed)
   09/18/2022  Name: Timothy Gibson MRN: [id number] DOB: Nov 20, 1950  Today's TOC FU Call Status: Today's TOC FU Call Status:: Unsuccessul Call (1st Attempt) Unsuccessful Call (1st Attempt) Date: 09/18/22  Attempted to reach the patient regarding the most recent Inpatient/ED visit.  Follow Up Plan: Additional outreach attempts will be made to reach the patient to complete the Transitions of Care (Post Inpatient/ED visit) call.   [facility] [phone]

## 2022-09-19 ENCOUNTER — Telehealth: Payer: Self-pay | Admitting: *Deleted

## 2022-09-19 ENCOUNTER — Ambulatory Visit: Payer: Medicare Other | Admitting: Student in an Organized Health Care Education/Training Program

## 2022-09-19 NOTE — Transitions of Care (Post Inpatient/ED Visit) (Signed)
   09/19/2022  Name: Timothy Gibson MRN: [id number] DOB: 09/01/1950  Today's TOC FU Call Status: Today's TOC FU Call Status:: Successful TOC FU Call Competed TOC FU Call Complete Date: 09/19/22  Transition Care Management Follow-up Telephone Call Date of Discharge: 09/15/22 Discharge Facility: [facility]) Type of Discharge: Inpatient Admission Primary Inpatient Discharge Diagnosis:: fluid overload How have you been since you were released from the hospital?: Better Any questions or concerns?: No  Items Reviewed: Did you receive and understand the discharge instructions provided?: Yes Medications obtained and verified?: Yes (Medications Reviewed) Any new allergies since your discharge?: No Dietary orders reviewed?: No Do you have support at home?: Yes Name of Support/Comfort Primary Source: caregiver and edith friend  Home Care and Equipment/Supplies: [place] Ordered?: No Any new equipment or medical supplies ordered?: No  Functional Questionnaire: Do you need assistance with bathing/showering or dressing?: Yes Do you need assistance with meal preparation?: Yes Do you have difficulty maintaining continence: No Do you need assistance with getting out of bed/getting out of a chair/moving?: No Do you have difficulty managing or taking your medications?: Yes  Folllow up appointments reviewed: PCP Follow-up appointment confirmed?: Yes Date of PCP follow-up appointment?: 09/27/22 Follow-up Provider: Jon Billings 2:00 [facility] Follow-up appointment confirmed?: Yes Date of Specialist follow-up appointment?: 09/19/22 Follow-Up Specialty Provider:: [id number] E[facility] 11:00 Patient caregiver will call to make appt with Dr Clayborn Bigness Do you need transportation to your follow-up appointment?: No Do you understand care options if your condition(s) worsen?: Yes-patient verbalized understanding  SDOH Interventions Today    Flowsheet  Row Most Recent Value  SDOH Interventions   Food Insecurity Interventions Intervention Not Indicated  Housing Interventions Intervention Not Indicated  Transportation Interventions Intervention Not Indicated      TOC Interventions Today    Flowsheet Row Most Recent Value  TOC Interventions   TOC Interventions Discussed/Reviewed TOC Interventions Discussed, TOC Interventions Reviewed      Interventions Today    Flowsheet Row Most Recent Value  Chronic Disease   Chronic disease during today's visit Chronic Kidney Disease/End Stage Renal Disease (ESRD)  General Interventions   General Interventions Discussed/Reviewed Doctor Visits  Doctor Visits Discussed/Reviewed Doctor Visits Reviewed, Doctor Visits Discussed, Specialist  [addresses [facility] [phone]

## 2022-09-20 DIAGNOSIS — D631 Anemia in chronic kidney disease: Secondary | ICD-10-CM | POA: Diagnosis not present

## 2022-09-20 DIAGNOSIS — D509 Iron deficiency anemia, unspecified: Secondary | ICD-10-CM | POA: Diagnosis not present

## 2022-09-20 DIAGNOSIS — N2581 Secondary hyperparathyroidism of renal origin: Secondary | ICD-10-CM | POA: Diagnosis not present

## 2022-09-20 DIAGNOSIS — N250 Renal osteodystrophy: Secondary | ICD-10-CM | POA: Diagnosis not present

## 2022-09-20 DIAGNOSIS — N186 End stage renal disease: Secondary | ICD-10-CM | POA: Diagnosis not present

## 2022-09-20 DIAGNOSIS — Z992 Dependence on renal dialysis: Secondary | ICD-10-CM | POA: Diagnosis not present

## 2022-09-22 DIAGNOSIS — D631 Anemia in chronic kidney disease: Secondary | ICD-10-CM | POA: Diagnosis not present

## 2022-09-22 DIAGNOSIS — D509 Iron deficiency anemia, unspecified: Secondary | ICD-10-CM | POA: Diagnosis not present

## 2022-09-22 DIAGNOSIS — N250 Renal osteodystrophy: Secondary | ICD-10-CM | POA: Diagnosis not present

## 2022-09-22 DIAGNOSIS — Z992 Dependence on renal dialysis: Secondary | ICD-10-CM | POA: Diagnosis not present

## 2022-09-22 DIAGNOSIS — N2581 Secondary hyperparathyroidism of renal origin: Secondary | ICD-10-CM | POA: Diagnosis not present

## 2022-09-22 DIAGNOSIS — N186 End stage renal disease: Secondary | ICD-10-CM | POA: Diagnosis not present

## 2022-09-25 ENCOUNTER — Ambulatory Visit
Admission: RE | Admit: 2022-09-25 | Discharge: 2022-09-25 | Disposition: A | Discharge status: Home / Self Care | Payer: Medicare Other | Source: Ambulatory Visit | Attending: Pulmonary Disease | Admitting: Pulmonary Disease

## 2022-09-25 DIAGNOSIS — D509 Iron deficiency anemia, unspecified: Secondary | ICD-10-CM | POA: Diagnosis not present

## 2022-09-25 DIAGNOSIS — N186 End stage renal disease: Secondary | ICD-10-CM | POA: Insufficient documentation

## 2022-09-25 DIAGNOSIS — E1122 Type 2 diabetes mellitus with diabetic chronic kidney disease: Secondary | ICD-10-CM | POA: Diagnosis not present

## 2022-09-25 DIAGNOSIS — I5032 Chronic diastolic (congestive) heart failure: Secondary | ICD-10-CM | POA: Insufficient documentation

## 2022-09-25 DIAGNOSIS — I132 Hypertensive heart and chronic kidney disease with heart failure and with stage 5 chronic kidney disease, or end stage renal disease: Secondary | ICD-10-CM | POA: Insufficient documentation

## 2022-09-25 DIAGNOSIS — N2581 Secondary hyperparathyroidism of renal origin: Secondary | ICD-10-CM | POA: Diagnosis not present

## 2022-09-25 DIAGNOSIS — D631 Anemia in chronic kidney disease: Secondary | ICD-10-CM | POA: Diagnosis not present

## 2022-09-25 DIAGNOSIS — N250 Renal osteodystrophy: Secondary | ICD-10-CM | POA: Diagnosis not present

## 2022-09-25 DIAGNOSIS — Z992 Dependence on renal dialysis: Secondary | ICD-10-CM | POA: Diagnosis not present

## 2022-09-25 LAB — ECHOCARDIOGRAM COMPLETE
AR max vel: 2.3 cm2
AV Area VTI: 2.65 cm2
AV Area mean vel: 2.26 cm2
AV Mean grad: 6 mmHg
AV Peak grad: 11.2 mmHg
Ao pk vel: 1.67 m/s
Area-P 1/2: 4.01 cm2
MV VTI: 1.87 cm2
S' Lateral: 3.5 cm

## 2022-09-25 NOTE — Progress Notes (Signed)
*  PRELIMINARY RESULTS* Echocardiogram 2D Echocardiogram has been performed.  Timothy Gibson 09/25/2022, 11:27 AM

## 2022-09-27 ENCOUNTER — Encounter: Payer: Self-pay | Admitting: Nurse Practitioner

## 2022-09-27 ENCOUNTER — Ambulatory Visit (INDEPENDENT_AMBULATORY_CARE_PROVIDER_SITE_OTHER): Payer: Medicare Other | Admitting: Nurse Practitioner

## 2022-09-27 VITALS — BP 198/78 | HR 76 | Temp 97.6°F | Wt 246.0 lb

## 2022-09-27 DIAGNOSIS — E1122 Type 2 diabetes mellitus with diabetic chronic kidney disease: Secondary | ICD-10-CM

## 2022-09-27 DIAGNOSIS — Z992 Dependence on renal dialysis: Secondary | ICD-10-CM

## 2022-09-27 DIAGNOSIS — F419 Anxiety disorder, unspecified: Secondary | ICD-10-CM

## 2022-09-27 DIAGNOSIS — I5022 Chronic systolic (congestive) heart failure: Secondary | ICD-10-CM | POA: Diagnosis not present

## 2022-09-27 DIAGNOSIS — D509 Iron deficiency anemia, unspecified: Secondary | ICD-10-CM | POA: Diagnosis not present

## 2022-09-27 DIAGNOSIS — N186 End stage renal disease: Secondary | ICD-10-CM

## 2022-09-27 DIAGNOSIS — N250 Renal osteodystrophy: Secondary | ICD-10-CM | POA: Diagnosis not present

## 2022-09-27 DIAGNOSIS — Z09 Encounter for follow-up examination after completed treatment for conditions other than malignant neoplasm: Secondary | ICD-10-CM

## 2022-09-27 DIAGNOSIS — Z794 Long term (current) use of insulin: Secondary | ICD-10-CM

## 2022-09-27 DIAGNOSIS — D631 Anemia in chronic kidney disease: Secondary | ICD-10-CM | POA: Diagnosis not present

## 2022-09-27 DIAGNOSIS — N2581 Secondary hyperparathyroidism of renal origin: Secondary | ICD-10-CM | POA: Diagnosis not present

## 2022-09-27 MED ORDER — BUSPIRONE HCL 10 MG PO TABS: 10.0000 mg | ORAL_TABLET | Freq: Two times a day (BID) | ORAL | 0 refills | Status: DC

## 2022-09-27 NOTE — Assessment & Plan Note (Signed)
Continue with dialysis ?

## 2022-09-27 NOTE — Assessment & Plan Note (Signed)
Chronic.  +2 edema in lower extremities.  Patient feels this is improved from hospitalization.  Denies SOB.  Recommend making an appt with Dr. Clayborn Bigness.  ECHO completed on Monday.

## 2022-09-27 NOTE — Progress Notes (Signed)
BP (!) 198/78   Pulse 76   Temp 97.6 F (36.4 C) (Oral)   Wt 246 lb (111.6 kg)   SpO2 96%   BMI 29.94 kg/m    Subjective:    Patient ID: Timothy Gibson, male    DOB: [birth year], 72 y.o.   MRN: [id number]  HPI: Timothy Gibson is a 72 y.o. male  Chief Complaint  Patient presents with   Hospitalization Follow-up   Insomnia    Pt states he has been having trouble sleeping for the past few weeks. States if he feels his covers on his neck, or he feels hot, then he will get "panicky"   Transition of [facility] Follow up.   Hospital/Facility: [facility] D/C Physician: Dr. Louanne Belton, MD D/C Date: 09/15/22  Records Requested: NA Records Received: Yes Records Reviewed: Yes  Diagnoses on Discharge:   Fluid overload and end stage renal disease (on HD- MWF):  Patient has lost about 9lbs since he was discharged.  He is still on dialysis M, W, F.  He still has fluid in his lower extremities.  He states the rest of the fluid has come off besides his lower extremities.    Chronic systolic CHF (congestive heart failure) ([place]) Patient had his ECHO on Monday.  His caregiver forgot to make the appt but plans to call when they leave here today.  He is not taking Entresto.  He is still taking the Metoprolol.     Patient states he is having some anxiety.  When something touches his neck he gets anxious and feels like he is going to have a panic attack.  This started about 7 weeks ago.  Doesn't want anything for sleep.  Wants something to help with his anxiety.   Date of interactive Contact within 48 hours of discharge:  Contact was through: phone  Date of 7 day or 14 day face-to-face visit:    within 14 days  Outpatient Encounter Medications as of 09/27/2022  Medication Sig   busPIRone (BUSPAR) 10 MG tablet Take 1 tablet (10 mg total) by mouth 2 (two) times daily.   Insulin Glargine (BASAGLAR KWIKPEN) 100 UNIT/ML Inject 20 Units into the skin at bedtime.   metoprolol succinate (TOPROL-XL) 25  MG 24 hr tablet Take 1 tablet (25 mg total) by mouth daily.   sevelamer carbonate (RENVELA) 800 MG tablet Take 2,400 mg by mouth 3 (three) times daily with meals.   [DISCONTINUED] albuterol (VENTOLIN HFA) 108 (90 Base) MCG/ACT inhaler Inhale 2 puffs into the lungs every 6 (six) hours as needed for wheezing or shortness of breath.   sacubitril-valsartan (ENTRESTO) 24-26 MG Take 1 tablet by mouth every 12 (twelve) hours. (Patient not taking: Reported on 09/27/2022)   No facility-administered encounter medications on file as of 09/27/2022.    Diagnostic Tests Reviewed/Disposition:   Consults:  Discharge Instructions  Disease/illness Education:  Home Health/Community Services Discussions/Referrals:  Establishment or re-establishment of referral orders for community resources:  Discussion with other health care providers:  Assessment and Support of treatment regimen adherence:  Appointments Coordinated with:   Education for self-management, independent living, and ADLs:    Relevant past medical, surgical, family and social history reviewed and updated as indicated. Interim medical history since our last visit reviewed. Allergies and medications reviewed and updated.  Review of Systems  Eyes:  Negative for visual disturbance.  Respiratory:  Negative for shortness of breath.   Cardiovascular:  Positive for leg swelling. Negative for chest pain.  Neurological:  Negative for light-headedness and headaches.  Psychiatric/Behavioral:  Positive for sleep disturbance. The patient is nervous/anxious.     Per HPI unless specifically indicated above     Objective:    BP (!) 198/78   Pulse 76   Temp 97.6 F (36.4 C) (Oral)   Wt 246 lb (111.6 kg)   SpO2 96%   BMI 29.94 kg/m   Wt Readings from Last 3 Encounters:  09/27/22 246 lb (111.6 kg)  09/15/22 254 lb 13.6 oz (115.6 kg)  08/24/22 262 lb 6.4 oz (119 kg)    Physical Exam Vitals and nursing note reviewed.  Constitutional:       General: He is not in acute distress.    Appearance: Normal appearance. He is not ill-appearing, toxic-appearing or diaphoretic.  HENT:     Head: Normocephalic.     Right Ear: External ear normal.     Left Ear: External ear normal.     Nose: Nose normal. No congestion or rhinorrhea.     Mouth/Throat:     Mouth: Mucous membranes are moist.  Eyes:     General:        Right eye: No discharge.        Left eye: No discharge.     Extraocular Movements: Extraocular movements intact.     Conjunctiva/sclera: Conjunctivae normal.     Pupils: Pupils are equal, round, and reactive to light.  Cardiovascular:     Rate and Rhythm: Normal rate and regular rhythm.     Heart sounds: No murmur heard. Pulmonary:     Effort: Pulmonary effort is normal. No respiratory distress.     Breath sounds: Normal breath sounds. No wheezing, rhonchi or rales.  Abdominal:     General: Abdomen is flat. Bowel sounds are normal.  Musculoskeletal:     Cervical back: Normal range of motion and neck supple.     Right lower leg: 2+ Edema present.     Left lower leg: 2+ Edema present.  Skin:    General: Skin is warm and dry.     Capillary Refill: Capillary refill takes less than 2 seconds.  Neurological:     General: No focal deficit present.     Mental Status: He is alert and oriented to person, place, and time.  Psychiatric:        Mood and Affect: Mood normal.        Behavior: Behavior normal.        Thought Content: Thought content normal.        Judgment: Judgment normal.     Results for orders placed or performed during the hospital encounter of 09/25/22  ECHOCARDIOGRAM COMPLETE  Result Value Ref Range   Ao pk vel 1.67 m/s   AV Area VTI 2.65 cm2   AR max vel 2.30 cm2   AV Mean grad 6.0 mmHg   AV Peak grad 11.2 mmHg   S' Lateral 3.50 cm   AV Area mean vel 2.26 cm2   Area-P 1/2 4.01 cm2   MV VTI 1.87 cm2   Est EF 55 - 60%       Assessment & Plan:   Problem List Items Addressed This Visit        Cardiovascular and Mediastinum   Chronic systolic CHF (congestive heart failure) (HCC)    Chronic.  +2 edema in lower extremities.  Patient feels this is improved from hospitalization.  Denies SOB.  Recommend making an appt with Dr. Clayborn Bigness.  ECHO completed on Monday.  Relevant Orders   CBC w/Diff     Endocrine   Type II diabetes mellitus with renal manifestations (HCC)    Chronic. Not well controlled.  A1c repeated at visit.  Not checking sugars.  Last A1c was 9.6%.      Relevant Orders   HgB A1c     Genitourinary   End stage renal disease ([place])    Continue with dialysis.        Relevant Orders   CBC w/Diff   Other Visit Diagnoses     Hospital discharge follow-up    -  Primary   Not taking Entresto. Needs to follow up with Dr. Clayborn Bigness.  CMP, CBC checked at visit today.   Relevant Orders   Comp Met (CMET)   CBC w/Diff   HgB A1c   Anxiety       New problem as of a few weeks ago. Will start Buspar 10mg  BID. Discussed how to take medication. Discussed side effects and benefits of medication. FU in 2 week   Relevant Medications   busPIRone (BUSPAR) 10 MG tablet        Follow up plan: Return in about 2 weeks (around 10/11/2022) for Anxiety.

## 2022-09-27 NOTE — Assessment & Plan Note (Signed)
Chronic. Not well controlled.  A1c repeated at visit.  Not checking sugars.  Last A1c was 9.6%.

## 2022-09-28 ENCOUNTER — Telehealth: Payer: Self-pay | Admitting: Internal Medicine

## 2022-09-28 DIAGNOSIS — J449 Chronic obstructive pulmonary disease, unspecified: Secondary | ICD-10-CM

## 2022-09-28 LAB — CBC WITH DIFFERENTIAL/PLATELET
Basophils Absolute: 0 10*3/uL (ref 0.0–0.2)
Basos: 1 %
EOS (ABSOLUTE): 0.2 10*3/uL (ref 0.0–0.4)
Eos: 7 %
Hematocrit: 25.9 % — ABNORMAL LOW (ref 37.5–51.0)
Hemoglobin: 8.4 g/dL — ABNORMAL LOW (ref 13.0–17.7)
Immature Grans (Abs): 0 10*3/uL (ref 0.0–0.1)
Immature Granulocytes: 0 %
Lymphocytes Absolute: 0.6 10*3/uL — ABNORMAL LOW (ref 0.7–3.1)
Lymphs: 18 %
MCH: 31.7 pg (ref 26.6–33.0)
MCHC: 32.4 g/dL (ref 31.5–35.7)
MCV: 98 fL — ABNORMAL HIGH (ref 79–97)
Monocytes Absolute: 0.3 10*3/uL (ref 0.1–0.9)
Monocytes: 9 %
Neutrophils Absolute: 2 10*3/uL (ref 1.4–7.0)
Neutrophils: 65 %
Platelets: 118 10*3/uL — ABNORMAL LOW (ref 150–450)
RBC: 2.65 x10E6/uL — CL (ref 4.14–5.80)
RDW: 12.8 % (ref 11.6–15.4)
WBC: 3 10*3/uL — ABNORMAL LOW (ref 3.4–10.8)

## 2022-09-28 LAB — HEMOGLOBIN A1C
Est. average glucose Bld gHb Est-mCnc: 171 mg/dL
Hgb A1c MFr Bld: 7.6 % — ABNORMAL HIGH (ref 4.8–5.6)

## 2022-09-28 LAB — COMPREHENSIVE METABOLIC PANEL
ALT: 15 IU/L (ref 0–44)
AST: 15 IU/L (ref 0–40)
Albumin/Globulin Ratio: 1.3 (ref 1.2–2.2)
Albumin: 3.9 g/dL (ref 3.8–4.8)
Alkaline Phosphatase: 142 IU/L — ABNORMAL HIGH (ref 44–121)
BUN/Creatinine Ratio: 6 — ABNORMAL LOW (ref 10–24)
BUN: 54 mg/dL — ABNORMAL HIGH (ref 8–27)
Bilirubin Total: 0.2 mg/dL (ref 0.0–1.2)
CO2: 27 mmol/L (ref 20–29)
Calcium: 9 mg/dL (ref 8.6–10.2)
Chloride: 99 mmol/L (ref 96–106)
Creatinine, Ser: 8.81 mg/dL — ABNORMAL HIGH (ref 0.76–1.27)
Globulin, Total: 3 g/dL (ref 1.5–4.5)
Glucose: 303 mg/dL — ABNORMAL HIGH (ref 70–99)
Potassium: 4.7 mmol/L (ref 3.5–5.2)
Sodium: 145 mmol/L — ABNORMAL HIGH (ref 134–144)
Total Protein: 6.9 g/dL (ref 6.0–8.5)
eGFR: 6 mL/min/{1.73_m2} — ABNORMAL LOW (ref >59)

## 2022-09-28 MED ORDER — TRELEGY ELLIPTA 100-62.5-25 MCG/ACT IN AEPB: 1.0000 | INHALATION_SPRAY | Freq: Every day | RESPIRATORY_TRACT | 3 refills | Status: DC

## 2022-09-28 NOTE — Telephone Encounter (Signed)
Can we call him to get him schedule to see Dr Genia Harold in [place]  Thank you

## 2022-09-28 NOTE — Telephone Encounter (Signed)
Timothy Gibson, calling for PT. Wants to set up an appt in [place] for a FU since it is closer. He was also just released from hospital.  Dr. Erin Fulling first saw PT here in [place] and AVS notes state the following:   Follow up in 1 month with Dr. Genia Harold at [facility] lung clinic with pulmonary function tests (This was done.)    Please call Olegario Shearer after it is determined who she should see and where and if it is OK to make a Dr. Crissie Reese to the [place] office.  [phone]  Also, Dr. Erin Fulling gave him a sample inhaler and he is out now (Trelegy). Can Dr. Please refill.  [facility] in [place]

## 2022-09-28 NOTE — Progress Notes (Signed)
Please let patient know that his lab work is stable.  Anemia remains stable.  His kidney function has improved some since the hospital.  His A1c improved to 7.6.  Make sure to follow a low carb diet.  Continue with current medication regimen.  Follow up as discussed.

## 2022-09-28 NOTE — Telephone Encounter (Signed)
Yes, this change in provider is ok as it was planned during the last visit and Dr. Genia Harold and I spoke about this change per the patient's preference.   Script for trelegy sent to his pharmacy.  Thanks, JD

## 2022-09-28 NOTE — Telephone Encounter (Signed)
Patient is wanting to starting seeing a provider out in the [place] clinic now due to the location is closer.   Dr Erin Fulling are you ok with the switch to Dr Genia Harold  Dr Genia Harold are you ok with this switch  Please advise

## 2022-09-29 DIAGNOSIS — N2581 Secondary hyperparathyroidism of renal origin: Secondary | ICD-10-CM | POA: Diagnosis not present

## 2022-09-29 DIAGNOSIS — D631 Anemia in chronic kidney disease: Secondary | ICD-10-CM | POA: Diagnosis not present

## 2022-09-29 DIAGNOSIS — Z992 Dependence on renal dialysis: Secondary | ICD-10-CM | POA: Diagnosis not present

## 2022-09-29 DIAGNOSIS — D509 Iron deficiency anemia, unspecified: Secondary | ICD-10-CM | POA: Diagnosis not present

## 2022-09-29 DIAGNOSIS — N186 End stage renal disease: Secondary | ICD-10-CM | POA: Diagnosis not present

## 2022-09-29 DIAGNOSIS — N250 Renal osteodystrophy: Secondary | ICD-10-CM | POA: Diagnosis not present

## 2022-10-02 DIAGNOSIS — N2581 Secondary hyperparathyroidism of renal origin: Secondary | ICD-10-CM | POA: Diagnosis not present

## 2022-10-02 DIAGNOSIS — N186 End stage renal disease: Secondary | ICD-10-CM | POA: Diagnosis not present

## 2022-10-02 DIAGNOSIS — Z992 Dependence on renal dialysis: Secondary | ICD-10-CM | POA: Diagnosis not present

## 2022-10-02 DIAGNOSIS — D509 Iron deficiency anemia, unspecified: Secondary | ICD-10-CM | POA: Diagnosis not present

## 2022-10-02 DIAGNOSIS — N250 Renal osteodystrophy: Secondary | ICD-10-CM | POA: Diagnosis not present

## 2022-10-02 DIAGNOSIS — D631 Anemia in chronic kidney disease: Secondary | ICD-10-CM | POA: Diagnosis not present

## 2022-10-04 DIAGNOSIS — D631 Anemia in chronic kidney disease: Secondary | ICD-10-CM | POA: Diagnosis not present

## 2022-10-04 DIAGNOSIS — Z992 Dependence on renal dialysis: Secondary | ICD-10-CM | POA: Diagnosis not present

## 2022-10-04 DIAGNOSIS — N2581 Secondary hyperparathyroidism of renal origin: Secondary | ICD-10-CM | POA: Diagnosis not present

## 2022-10-04 DIAGNOSIS — N250 Renal osteodystrophy: Secondary | ICD-10-CM | POA: Diagnosis not present

## 2022-10-04 DIAGNOSIS — N186 End stage renal disease: Secondary | ICD-10-CM | POA: Diagnosis not present

## 2022-10-04 DIAGNOSIS — D509 Iron deficiency anemia, unspecified: Secondary | ICD-10-CM | POA: Diagnosis not present

## 2022-10-06 ENCOUNTER — Emergency Department: Payer: Medicare Other

## 2022-10-06 ENCOUNTER — Emergency Department
Admission: EM | Admit: 2022-10-06 | Discharge: 2022-10-06 | Disposition: A | Discharge status: Home / Self Care | Payer: Medicare Other | Attending: Emergency Medicine | Admitting: Emergency Medicine

## 2022-10-06 DIAGNOSIS — I132 Hypertensive heart and chronic kidney disease with heart failure and with stage 5 chronic kidney disease, or end stage renal disease: Secondary | ICD-10-CM | POA: Insufficient documentation

## 2022-10-06 DIAGNOSIS — J449 Chronic obstructive pulmonary disease, unspecified: Secondary | ICD-10-CM | POA: Diagnosis not present

## 2022-10-06 DIAGNOSIS — S299XXA Unspecified injury of thorax, initial encounter: Secondary | ICD-10-CM | POA: Diagnosis not present

## 2022-10-06 DIAGNOSIS — Y92009 Unspecified place in unspecified non-institutional (private) residence as the place of occurrence of the external cause: Secondary | ICD-10-CM | POA: Diagnosis not present

## 2022-10-06 DIAGNOSIS — Z992 Dependence on renal dialysis: Secondary | ICD-10-CM | POA: Insufficient documentation

## 2022-10-06 DIAGNOSIS — W07XXXA Fall from chair, initial encounter: Secondary | ICD-10-CM | POA: Diagnosis not present

## 2022-10-06 DIAGNOSIS — I10 Essential (primary) hypertension: Secondary | ICD-10-CM | POA: Diagnosis not present

## 2022-10-06 DIAGNOSIS — S20212A Contusion of left front wall of thorax, initial encounter: Secondary | ICD-10-CM | POA: Diagnosis not present

## 2022-10-06 DIAGNOSIS — I509 Heart failure, unspecified: Secondary | ICD-10-CM | POA: Diagnosis not present

## 2022-10-06 DIAGNOSIS — N2581 Secondary hyperparathyroidism of renal origin: Secondary | ICD-10-CM | POA: Diagnosis not present

## 2022-10-06 DIAGNOSIS — N250 Renal osteodystrophy: Secondary | ICD-10-CM | POA: Diagnosis not present

## 2022-10-06 DIAGNOSIS — Z043 Encounter for examination and observation following other accident: Secondary | ICD-10-CM | POA: Diagnosis not present

## 2022-10-06 DIAGNOSIS — D509 Iron deficiency anemia, unspecified: Secondary | ICD-10-CM | POA: Diagnosis not present

## 2022-10-06 DIAGNOSIS — N186 End stage renal disease: Secondary | ICD-10-CM | POA: Insufficient documentation

## 2022-10-06 DIAGNOSIS — W08XXXA Fall from other furniture, initial encounter: Secondary | ICD-10-CM | POA: Diagnosis not present

## 2022-10-06 DIAGNOSIS — D631 Anemia in chronic kidney disease: Secondary | ICD-10-CM | POA: Diagnosis not present

## 2022-10-06 DIAGNOSIS — W19XXXA Unspecified fall, initial encounter: Secondary | ICD-10-CM

## 2022-10-06 DIAGNOSIS — R609 Edema, unspecified: Secondary | ICD-10-CM | POA: Diagnosis not present

## 2022-10-06 LAB — BASIC METABOLIC PANEL
Anion gap: 13 (ref 5–15)
BUN: 59 mg/dL — ABNORMAL HIGH (ref 8–23)
CO2: 29 mmol/L (ref 22–32)
Calcium: 8.9 mg/dL (ref 8.9–10.3)
Chloride: 100 mmol/L (ref 98–111)
Creatinine, Ser: 10.6 mg/dL — ABNORMAL HIGH (ref 0.61–1.24)
GFR, Estimated: 5 mL/min — ABNORMAL LOW (ref >60)
Glucose, Bld: 89 mg/dL (ref 70–99)
Potassium: 4 mmol/L (ref 3.5–5.1)
Sodium: 142 mmol/L (ref 135–145)

## 2022-10-06 LAB — CBC WITH DIFFERENTIAL/PLATELET
Abs Immature Granulocytes: 0.02 10*3/uL (ref 0.00–0.07)
Basophils Absolute: 0 10*3/uL (ref 0.0–0.1)
Basophils Relative: 1 %
Eosinophils Absolute: 0.2 10*3/uL (ref 0.0–0.5)
Eosinophils Relative: 5 %
HCT: 26.2 % — ABNORMAL LOW (ref 39.0–52.0)
Hemoglobin: 8 g/dL — ABNORMAL LOW (ref 13.0–17.0)
Immature Granulocytes: 0 %
Lymphocytes Relative: 27 %
Lymphs Abs: 1.3 10*3/uL (ref 0.7–4.0)
MCH: 31.4 pg (ref 26.0–34.0)
MCHC: 30.5 g/dL (ref 30.0–36.0)
MCV: 102.7 fL — ABNORMAL HIGH (ref 80.0–100.0)
Monocytes Absolute: 0.5 10*3/uL (ref 0.1–1.0)
Monocytes Relative: 11 %
Neutro Abs: 2.7 10*3/uL (ref 1.7–7.7)
Neutrophils Relative %: 56 %
Platelets: 97 10*3/uL — ABNORMAL LOW (ref 150–400)
RBC: 2.55 MIL/uL — ABNORMAL LOW (ref 4.22–5.81)
RDW: 13.5 % (ref 11.5–15.5)
Smear Review: NORMAL
WBC: 4.8 10*3/uL (ref 4.0–10.5)
nRBC: 0 % (ref 0.0–0.2)

## 2022-10-06 MED ORDER — ACETAMINOPHEN 325 MG PO TABS
650.0000 mg | ORAL_TABLET | Freq: Once | ORAL | Status: AC
  Administered 2022-10-06: 650 mg via ORAL
  Filled 2022-10-06: qty 2

## 2022-10-06 NOTE — ED Notes (Signed)
RN spoke to [name], MD regarding pt BP: 191 57.  Provider approved continued discharge of pt, and advised pt had an appointment for dialysis

## 2022-10-06 NOTE — ED Triage Notes (Addendum)
Patient arrived via EMS from home for mechanical fall from a stool. Patient fell asleep while sitting on the stool. Denies any head injuries, LOC, or blood thinners. Endorses left rib pain. Patient is also C/O bilateral lower extremity swelling and left hand swelling.

## 2022-10-06 NOTE — ED Notes (Signed)
Patient transported to Ultrasound 

## 2022-10-06 NOTE — ED Provider Notes (Signed)
Timothy Gibson [facility] Provider Note    Event Date/Time   First MD Initiated Contact with Patient 10/06/22 0225     (approximate)   History   Fall and Rib Injury   HPI  Timothy Gibson is a 72 y.o. male with extensive chronic medical issues including end-stage renal disease on hemodialysis Mondays, Wednesdays, and Fridays, hypertension, COPD, CHF, etc.  The patient presents by EMS for evaluation after a fall.  He said that he has not been sleeping well for 7 weeks.  He was sitting up on the side of the bed and then fell asleep while sitting on a stool, falling forward and landing on a carpeted floor.  He said that he thinks he hit the left side of his ribs on something because he has tenderness on the left ribs and thinks he broke a rib.  It hurts when he takes deep breaths.  Otherwise he feels no worse than he always does.  He said he has been going downhill for months.  He has been compliant with his dialysis and is supposed to go to dialysis this morning.  He has had no recent fever.  He denies chest pain other than the pain in his ribs.  He denies abdominal pain and nausea/vomiting.     Physical Exam   ED Triage Vitals  Enc Vitals Group     BP 10/06/22 0230 122/85     Pulse Rate 10/06/22 0230 64     Resp 10/06/22 0230 19     Temp 10/06/22 0324 97.7 F (36.5 C)     Temp Source 10/06/22 0324 Oral     SpO2 10/06/22 0230 99 %     Weight 10/06/22 0230 128.6 kg (283 lb 8.2 oz)     Height 10/06/22 0230 1.93 m (6\' 4" )     Head Circumference --      Peak Flow --      Pain Score 10/06/22 0450 0     Pain Loc --      Pain Edu? --      Excl. in [place]? --      Most recent vital signs: Vitals:   10/06/22 0345 10/06/22 0400  BP:  (!) 191/57  Pulse: 67 65  Resp:  19  Temp:    SpO2: 97% 98%     General: Awake, no respiratory distress.  Alert, responsive to questions. CV:  Good peripheral perfusion.  Regular rate and rhythm.  Normal heart sounds. Resp:  Normal  effort. Speaking easily and comfortably, no accessory muscle usage nor intercostal retractions.  Lungs are clear to auscultation. Abd:  No distention.  No tenderness to palpation. Other:  There is point tenderness to palpation in the left lateral lower part of his rib cage but no palpable deformities.  He has what appears to be chronic trace pitting edema in bilateral lower extremities but with no evidence of cellulitis or asymmetrical swelling.   ED Results / Procedures / Treatments   Labs (all labs ordered are listed, but only abnormal results are displayed) Labs Reviewed  CBC WITH DIFFERENTIAL/PLATELET - Abnormal; Notable for the following components:      Result Value   RBC 2.55 (*)    Hemoglobin 8.0 (*)    HCT 26.2 (*)    MCV 102.7 (*)    Platelets 97 (*)    All other components within normal limits  BASIC METABOLIC PANEL - Abnormal; Notable for the following components:   BUN 59 (*)  Creatinine, Ser 10.60 (*)    GFR, Estimated 5 (*)    All other components within normal limits     EKG  ED ECG REPORT I, Hinda Kehr, the attending physician, personally viewed and interpreted this ECG.  Date: 10/06/2022 EKG Time: 2:34 AM Rate: 67 Rhythm: normal sinus rhythm QRS Axis: normal Intervals: Short PR interval ST/T Wave abnormalities: Non-specific ST segment / T-wave changes, but no clear evidence of acute ischemia. Narrative Interpretation: no definitive evidence of acute ischemia; does not meet STEMI criteria.    RADIOLOGY I viewed and interpreted the patient's rib x-rays and chest x-ray.  I see no evidence of rib fracture nor pulmonary edema.    PROCEDURES:  Critical Care performed: No  .1-3 Lead EKG Interpretation  Performed by: Hinda Kehr, MD Authorized by: Hinda Kehr, MD     Interpretation: normal     ECG rate:  86   ECG rate assessment: normal     Rhythm: sinus rhythm     Ectopy: none     Conduction: normal      MEDICATIONS ORDERED IN  ED: Medications  acetaminophen (TYLENOL) tablet 650 mg (650 mg Oral Given 10/06/22 0430)     IMPRESSION / MDM / ASSESSMENT AND PLAN / ED COURSE  I reviewed the triage vital signs and the nursing notes.                              Differential diagnosis includes, but is not limited to, fall, chest contusion, rib fracture, pulmonary edema, volume overload in general, electrolyte or metabolic abnormality  Patient's presentation is most consistent with acute presentation with potential threat to life or bodily function.  Labs/studies ordered: Left rib x-rays with chest, basic metabolic panel, CBC with differential, EKG [facility] Course my include additional interventions or labs/studies not listed above.)  Patient is hypertensive but in no acute distress and has point tenderness to the left side of his chest wall after a fall to a carpeted floor.  His vital signs are otherwise normal.  Physical exam is reassuring and appears chronic with no evidence of acute issue.  No evidence of injury from fall.  I evaluated with labs and imaging as described above to see if he might be having an more acute issue such as severe hyperkalemia or pulmonary edema, but his evaluation was very reassuring as described above.  He remained hypertensive but I explained to him that there is no indication that he needs to stay in the hospital.  The best thing for him is to follow-up as an outpatient with his dialysis center at the next available opportunity, should be within the next hour or 2.  He seems irritated by these findings but his friend with whom he lives is at bedside and said that she understands and agrees.  He is being discharged for close follow-up at the dialysis center.  The patient is on the cardiac monitor to evaluate for evidence of arrhythmia and/or significant heart rate changes.       FINAL CLINICAL IMPRESSION(S) / ED DIAGNOSES   Final diagnoses:  Fall, initial encounter  Chest wall  contusion, left, initial encounter     Rx / DC Orders   ED Discharge Orders     None        Note:  This document was prepared using Dragon voice recognition software and may include unintentional dictation errors.   Hinda Kehr, MD 10/06/22 [phone]

## 2022-10-06 NOTE — Discharge Instructions (Signed)

## 2022-10-07 DIAGNOSIS — D509 Iron deficiency anemia, unspecified: Secondary | ICD-10-CM | POA: Diagnosis not present

## 2022-10-07 DIAGNOSIS — N250 Renal osteodystrophy: Secondary | ICD-10-CM | POA: Diagnosis not present

## 2022-10-07 DIAGNOSIS — N186 End stage renal disease: Secondary | ICD-10-CM | POA: Diagnosis not present

## 2022-10-07 DIAGNOSIS — Z992 Dependence on renal dialysis: Secondary | ICD-10-CM | POA: Diagnosis not present

## 2022-10-07 DIAGNOSIS — N2581 Secondary hyperparathyroidism of renal origin: Secondary | ICD-10-CM | POA: Diagnosis not present

## 2022-10-07 DIAGNOSIS — D631 Anemia in chronic kidney disease: Secondary | ICD-10-CM | POA: Diagnosis not present

## 2022-10-08 DIAGNOSIS — Z992 Dependence on renal dialysis: Secondary | ICD-10-CM | POA: Diagnosis not present

## 2022-10-08 DIAGNOSIS — N186 End stage renal disease: Secondary | ICD-10-CM | POA: Diagnosis not present

## 2022-10-10 NOTE — Telephone Encounter (Signed)
Done

## 2022-10-11 ENCOUNTER — Ambulatory Visit: Payer: Medicare Other | Admitting: Nurse Practitioner

## 2022-10-11 ENCOUNTER — Telehealth: Payer: Self-pay

## 2022-10-11 DIAGNOSIS — Z992 Dependence on renal dialysis: Secondary | ICD-10-CM | POA: Diagnosis not present

## 2022-10-11 DIAGNOSIS — N2581 Secondary hyperparathyroidism of renal origin: Secondary | ICD-10-CM | POA: Diagnosis not present

## 2022-10-11 DIAGNOSIS — E8779 Other fluid overload: Secondary | ICD-10-CM | POA: Diagnosis not present

## 2022-10-11 DIAGNOSIS — D509 Iron deficiency anemia, unspecified: Secondary | ICD-10-CM | POA: Diagnosis not present

## 2022-10-11 DIAGNOSIS — N186 End stage renal disease: Secondary | ICD-10-CM | POA: Diagnosis not present

## 2022-10-11 DIAGNOSIS — D631 Anemia in chronic kidney disease: Secondary | ICD-10-CM | POA: Diagnosis not present

## 2022-10-11 DIAGNOSIS — N250 Renal osteodystrophy: Secondary | ICD-10-CM | POA: Diagnosis not present

## 2022-10-11 NOTE — Progress Notes (Deleted)
   There were no vitals taken for this visit.   Subjective:    Patient ID: Timothy Gibson, male    DOB: [birth year], 72 y.o.   MRN: [id number]  HPI: Timothy Gibson is a 72 y.o. male  No chief complaint on file.  Patient states he is having some anxiety.  When something touches his neck he gets anxious and feels like he is going to have a panic attack.  This started about 7 weeks ago.  Doesn't want anything for sleep.  Wants something to help with his anxiety.    Relevant past medical, surgical, family and social history reviewed and updated as indicated. Interim medical history since our last visit reviewed. Allergies and medications reviewed and updated.  Review of Systems  Per HPI unless specifically indicated above     Objective:    There were no vitals taken for this visit.  Wt Readings from Last 3 Encounters:  10/06/22 283 lb 8.2 oz (128.6 kg)  09/27/22 246 lb (111.6 kg)  09/15/22 254 lb 13.6 oz (115.6 kg)    Physical Exam  Results for orders placed or performed during the hospital encounter of 10/06/22  CBC with Differential  Result Value Ref Range   WBC 4.8 4.0 - 10.5 K/uL   RBC 2.55 (L) 4.22 - 5.81 MIL/uL   Hemoglobin 8.0 (L) 13.0 - 17.0 g/dL   HCT 26.2 (L) 39.0 - 52.0 %   MCV 102.7 (H) 80.0 - 100.0 fL   MCH 31.4 26.0 - 34.0 pg   MCHC 30.5 30.0 - 36.0 g/dL   RDW 13.5 11.5 - 15.5 %   Platelets 97 (L) 150 - 400 K/uL   nRBC 0.0 0.0 - 0.2 %   Neutrophils Relative % 56 %   Neutro Abs 2.7 1.7 - 7.7 K/uL   Lymphocytes Relative 27 %   Lymphs Abs 1.3 0.7 - 4.0 K/uL   Monocytes Relative 11 %   Monocytes Absolute 0.5 0.1 - 1.0 K/uL   Eosinophils Relative 5 %   Eosinophils Absolute 0.2 0.0 - 0.5 K/uL   Basophils Relative 1 %   Basophils Absolute 0.0 0.0 - 0.1 K/uL   WBC Morphology MORPHOLOGY UNREMARKABLE    RBC Morphology MORPHOLOGY UNREMARKABLE    Smear Review Normal platelet morphology    Immature Granulocytes 0 %   Abs Immature Granulocytes 0.02 0.00 - 0.07  K/uL  Basic metabolic panel  Result Value Ref Range   Sodium 142 135 - 145 mmol/L   Potassium 4.0 3.5 - 5.1 mmol/L   Chloride 100 98 - 111 mmol/L   CO2 29 22 - 32 mmol/L   Glucose, Bld 89 70 - 99 mg/dL   BUN 59 (H) 8 - 23 mg/dL   Creatinine, Ser 10.60 (H) 0.61 - 1.24 mg/dL   Calcium 8.9 8.9 - 10.3 mg/dL   GFR, Estimated 5 (L) >60 mL/min   Anion gap 13 5 - 15      Assessment & Plan:   Problem List Items Addressed This Visit   None    Follow up plan: No follow-ups on file.

## 2022-10-11 NOTE — Telephone Encounter (Signed)
     Patient  visit on 3/29  at [place]   Have you been able to follow up with your primary care physician? Yes   The patient was or was not able to obtain any needed medicine or equipment. Yes   Are there diet recommendations that you are having difficulty following? Na   Patient expresses understanding of discharge instructions and education provided has no other needs at this time.  Yes      [facility] [phone] [address], [place], [place] [address] Phone: [phone] Email: [email or web address]

## 2022-10-12 DIAGNOSIS — D509 Iron deficiency anemia, unspecified: Secondary | ICD-10-CM | POA: Diagnosis not present

## 2022-10-12 DIAGNOSIS — D631 Anemia in chronic kidney disease: Secondary | ICD-10-CM | POA: Diagnosis not present

## 2022-10-12 DIAGNOSIS — E8779 Other fluid overload: Secondary | ICD-10-CM | POA: Diagnosis not present

## 2022-10-12 DIAGNOSIS — Z992 Dependence on renal dialysis: Secondary | ICD-10-CM | POA: Diagnosis not present

## 2022-10-12 DIAGNOSIS — N186 End stage renal disease: Secondary | ICD-10-CM | POA: Diagnosis not present

## 2022-10-12 DIAGNOSIS — N2581 Secondary hyperparathyroidism of renal origin: Secondary | ICD-10-CM | POA: Diagnosis not present

## 2022-10-13 DIAGNOSIS — N186 End stage renal disease: Secondary | ICD-10-CM | POA: Diagnosis not present

## 2022-10-13 DIAGNOSIS — E8779 Other fluid overload: Secondary | ICD-10-CM | POA: Diagnosis not present

## 2022-10-13 DIAGNOSIS — Z992 Dependence on renal dialysis: Secondary | ICD-10-CM | POA: Diagnosis not present

## 2022-10-13 DIAGNOSIS — D631 Anemia in chronic kidney disease: Secondary | ICD-10-CM | POA: Diagnosis not present

## 2022-10-13 DIAGNOSIS — D509 Iron deficiency anemia, unspecified: Secondary | ICD-10-CM | POA: Diagnosis not present

## 2022-10-13 DIAGNOSIS — N2581 Secondary hyperparathyroidism of renal origin: Secondary | ICD-10-CM | POA: Diagnosis not present

## 2022-10-16 ENCOUNTER — Inpatient Hospital Stay
Admission: EM | Admit: 2022-10-16 | Discharge: 2022-10-18 | DRG: 640 | Disposition: A | Discharge status: Home / Self Care | Payer: Medicare Other | Attending: Internal Medicine | Admitting: Internal Medicine

## 2022-10-16 ENCOUNTER — Encounter: Payer: Self-pay | Admitting: Internal Medicine

## 2022-10-16 ENCOUNTER — Emergency Department: Payer: Medicare Other

## 2022-10-16 DIAGNOSIS — E1122 Type 2 diabetes mellitus with diabetic chronic kidney disease: Secondary | ICD-10-CM | POA: Diagnosis present

## 2022-10-16 DIAGNOSIS — Z87891 Personal history of nicotine dependence: Secondary | ICD-10-CM

## 2022-10-16 DIAGNOSIS — Z905 Acquired absence of kidney: Secondary | ICD-10-CM

## 2022-10-16 DIAGNOSIS — I10 Essential (primary) hypertension: Secondary | ICD-10-CM | POA: Diagnosis present

## 2022-10-16 DIAGNOSIS — I5A Non-ischemic myocardial injury (non-traumatic): Secondary | ICD-10-CM | POA: Diagnosis not present

## 2022-10-16 DIAGNOSIS — R600 Localized edema: Secondary | ICD-10-CM | POA: Diagnosis not present

## 2022-10-16 DIAGNOSIS — J9600 Acute respiratory failure, unspecified whether with hypoxia or hypercapnia: Secondary | ICD-10-CM | POA: Diagnosis not present

## 2022-10-16 DIAGNOSIS — Z79899 Other long term (current) drug therapy: Secondary | ICD-10-CM

## 2022-10-16 DIAGNOSIS — F32A Depression, unspecified: Secondary | ICD-10-CM | POA: Diagnosis present

## 2022-10-16 DIAGNOSIS — E785 Hyperlipidemia, unspecified: Secondary | ICD-10-CM | POA: Diagnosis present

## 2022-10-16 DIAGNOSIS — N186 End stage renal disease: Secondary | ICD-10-CM | POA: Diagnosis present

## 2022-10-16 DIAGNOSIS — N400 Enlarged prostate without lower urinary tract symptoms: Secondary | ICD-10-CM | POA: Diagnosis present

## 2022-10-16 DIAGNOSIS — Z85528 Personal history of other malignant neoplasm of kidney: Secondary | ICD-10-CM

## 2022-10-16 DIAGNOSIS — I132 Hypertensive heart and chronic kidney disease with heart failure and with stage 5 chronic kidney disease, or end stage renal disease: Secondary | ICD-10-CM | POA: Diagnosis present

## 2022-10-16 DIAGNOSIS — E8770 Fluid overload, unspecified: Secondary | ICD-10-CM | POA: Diagnosis not present

## 2022-10-16 DIAGNOSIS — J449 Chronic obstructive pulmonary disease, unspecified: Secondary | ICD-10-CM | POA: Diagnosis present

## 2022-10-16 DIAGNOSIS — Z66 Do not resuscitate: Secondary | ICD-10-CM | POA: Diagnosis present

## 2022-10-16 DIAGNOSIS — Z992 Dependence on renal dialysis: Secondary | ICD-10-CM

## 2022-10-16 DIAGNOSIS — I5022 Chronic systolic (congestive) heart failure: Secondary | ICD-10-CM | POA: Diagnosis not present

## 2022-10-16 DIAGNOSIS — N2581 Secondary hyperparathyroidism of renal origin: Secondary | ICD-10-CM | POA: Diagnosis not present

## 2022-10-16 DIAGNOSIS — Z800 Family history of malignant neoplasm of digestive organs: Secondary | ICD-10-CM | POA: Diagnosis not present

## 2022-10-16 DIAGNOSIS — Z8249 Family history of ischemic heart disease and other diseases of the circulatory system: Secondary | ICD-10-CM | POA: Diagnosis not present

## 2022-10-16 DIAGNOSIS — Z820 Family history of epilepsy and other diseases of the nervous system: Secondary | ICD-10-CM | POA: Diagnosis not present

## 2022-10-16 DIAGNOSIS — J811 Chronic pulmonary edema: Secondary | ICD-10-CM | POA: Diagnosis not present

## 2022-10-16 DIAGNOSIS — D631 Anemia in chronic kidney disease: Secondary | ICD-10-CM | POA: Diagnosis not present

## 2022-10-16 DIAGNOSIS — Z833 Family history of diabetes mellitus: Secondary | ICD-10-CM

## 2022-10-16 DIAGNOSIS — Z794 Long term (current) use of insulin: Secondary | ICD-10-CM | POA: Diagnosis not present

## 2022-10-16 DIAGNOSIS — R0602 Shortness of breath: Secondary | ICD-10-CM | POA: Diagnosis not present

## 2022-10-16 DIAGNOSIS — I5033 Acute on chronic diastolic (congestive) heart failure: Secondary | ICD-10-CM | POA: Diagnosis present

## 2022-10-16 DIAGNOSIS — I160 Hypertensive urgency: Secondary | ICD-10-CM | POA: Diagnosis present

## 2022-10-16 DIAGNOSIS — E1129 Type 2 diabetes mellitus with other diabetic kidney complication: Secondary | ICD-10-CM | POA: Diagnosis present

## 2022-10-16 DIAGNOSIS — E1151 Type 2 diabetes mellitus with diabetic peripheral angiopathy without gangrene: Secondary | ICD-10-CM | POA: Diagnosis present

## 2022-10-16 DIAGNOSIS — R0600 Dyspnea, unspecified: Secondary | ICD-10-CM

## 2022-10-16 LAB — LIPID PANEL
Cholesterol: 100 mg/dL (ref 0–200)
HDL: 38 mg/dL — ABNORMAL LOW (ref >40)
LDL Cholesterol: 42 mg/dL (ref 0–99)
Total CHOL/HDL Ratio: 2.6 RATIO
Triglycerides: 100 mg/dL (ref <150)
VLDL: 20 mg/dL (ref 0–40)

## 2022-10-16 LAB — CBC
HCT: 27.7 % — ABNORMAL LOW (ref 39.0–52.0)
Hemoglobin: 8.5 g/dL — ABNORMAL LOW (ref 13.0–17.0)
MCH: 31.4 pg (ref 26.0–34.0)
MCHC: 30.7 g/dL (ref 30.0–36.0)
MCV: 102.2 fL — ABNORMAL HIGH (ref 80.0–100.0)
Platelets: 142 10*3/uL — ABNORMAL LOW (ref 150–400)
RBC: 2.71 MIL/uL — ABNORMAL LOW (ref 4.22–5.81)
RDW: 13.6 % (ref 11.5–15.5)
WBC: 4 10*3/uL (ref 4.0–10.5)
nRBC: 0 % (ref 0.0–0.2)

## 2022-10-16 LAB — COMPREHENSIVE METABOLIC PANEL
ALT: 24 U/L (ref 0–44)
AST: 24 U/L (ref 15–41)
Albumin: 3.6 g/dL (ref 3.5–5.0)
Alkaline Phosphatase: 75 U/L (ref 38–126)
Anion gap: 15 (ref 5–15)
BUN: 70 mg/dL — ABNORMAL HIGH (ref 8–23)
CO2: 27 mmol/L (ref 22–32)
Calcium: 8.9 mg/dL (ref 8.9–10.3)
Chloride: 101 mmol/L (ref 98–111)
Creatinine, Ser: 12.73 mg/dL — ABNORMAL HIGH (ref 0.61–1.24)
GFR, Estimated: 4 mL/min — ABNORMAL LOW (ref >60)
Glucose, Bld: 266 mg/dL — ABNORMAL HIGH (ref 70–99)
Potassium: 4.2 mmol/L (ref 3.5–5.1)
Sodium: 143 mmol/L (ref 135–145)
Total Bilirubin: 0.8 mg/dL (ref 0.3–1.2)
Total Protein: 7.7 g/dL (ref 6.5–8.1)

## 2022-10-16 LAB — CBG MONITORING, ED
Glucose-Capillary: 205 mg/dL — ABNORMAL HIGH (ref 70–99)
Glucose-Capillary: 109 mg/dL — ABNORMAL HIGH (ref 70–99)

## 2022-10-16 LAB — HEPATITIS B SURFACE ANTIGEN: Hepatitis B Surface Ag: NONREACTIVE

## 2022-10-16 LAB — GLUCOSE, CAPILLARY: Glucose-Capillary: 164 mg/dL — ABNORMAL HIGH (ref 70–99)

## 2022-10-16 LAB — BRAIN NATRIURETIC PEPTIDE: B Natriuretic Peptide: 667.7 pg/mL — ABNORMAL HIGH (ref 0.0–100.0)

## 2022-10-16 LAB — TROPONIN I (HIGH SENSITIVITY)
Troponin I (High Sensitivity): 105 ng/L (ref <18)
Troponin I (High Sensitivity): 94 ng/L — ABNORMAL HIGH (ref <18)

## 2022-10-16 MED ORDER — PENTAFLUOROPROP-TETRAFLUOROETH EX AERO: 1.0000 | INHALATION_SPRAY | CUTANEOUS | Status: DC | PRN

## 2022-10-16 MED ORDER — ONDANSETRON HCL 4 MG/2ML IJ SOLN: 4.0000 mg | Freq: Three times a day (TID) | INTRAMUSCULAR | Status: DC | PRN

## 2022-10-16 MED ORDER — DIPHENHYDRAMINE HCL 50 MG/ML IJ SOLN: 12.5000 mg | Freq: Three times a day (TID) | INTRAMUSCULAR | Status: DC | PRN

## 2022-10-16 MED ORDER — INSULIN ASPART 100 UNIT/ML IJ SOLN
0.0000 [IU] | Freq: Every day | INTRAMUSCULAR | Status: DC
  Administered 2022-10-17: 3 [IU] via SUBCUTANEOUS
  Filled 2022-10-16: qty 1

## 2022-10-16 MED ORDER — AMLODIPINE BESYLATE 5 MG PO TABS
5.0000 mg | ORAL_TABLET | Freq: Every day | ORAL | Status: DC
  Administered 2022-10-16 – 2022-10-18 (×3): 5 mg via ORAL
  Filled 2022-10-16 (×3): qty 1

## 2022-10-16 MED ORDER — DM-GUAIFENESIN ER 30-600 MG PO TB12: 1.0000 | ORAL_TABLET | Freq: Two times a day (BID) | ORAL | Status: DC | PRN

## 2022-10-16 MED ORDER — HEPARIN SODIUM (PORCINE) 5000 UNIT/ML IJ SOLN
5000.0000 [IU] | Freq: Three times a day (TID) | INTRAMUSCULAR | Status: DC
  Administered 2022-10-16 – 2022-10-18 (×6): 5000 [IU] via SUBCUTANEOUS
  Filled 2022-10-16 (×6): qty 1

## 2022-10-16 MED ORDER — SEVELAMER CARBONATE 800 MG PO TABS
2400.0000 mg | ORAL_TABLET | Freq: Three times a day (TID) | ORAL | Status: DC
  Administered 2022-10-17 – 2022-10-18 (×3): 2400 mg via ORAL
  Filled 2022-10-16 (×3): qty 3

## 2022-10-16 MED ORDER — ALBUTEROL SULFATE (2.5 MG/3ML) 0.083% IN NEBU: 3.0000 mL | INHALATION_SOLUTION | RESPIRATORY_TRACT | Status: DC | PRN

## 2022-10-16 MED ORDER — CHLORHEXIDINE GLUCONATE CLOTH 2 % EX PADS
6.0000 | MEDICATED_PAD | Freq: Every day | CUTANEOUS | Status: DC
  Administered 2022-10-17 – 2022-10-18 (×2): 6 via TOPICAL
  Filled 2022-10-16: qty 6

## 2022-10-16 MED ORDER — UMECLIDINIUM BROMIDE 62.5 MCG/ACT IN AEPB
1.0000 | INHALATION_SPRAY | Freq: Every day | RESPIRATORY_TRACT | Status: DC
  Administered 2022-10-17 – 2022-10-18 (×2): 1 via RESPIRATORY_TRACT
  Filled 2022-10-16: qty 7

## 2022-10-16 MED ORDER — INSULIN ASPART 100 UNIT/ML IJ SOLN
0.0000 [IU] | Freq: Three times a day (TID) | INTRAMUSCULAR | Status: DC
  Administered 2022-10-16 – 2022-10-17 (×2): 3 [IU] via SUBCUTANEOUS
  Administered 2022-10-18: 2 [IU] via SUBCUTANEOUS
  Administered 2022-10-18: 7 [IU] via SUBCUTANEOUS
  Filled 2022-10-16 (×4): qty 1

## 2022-10-16 MED ORDER — ACETAMINOPHEN 325 MG PO TABS
650.0000 mg | ORAL_TABLET | Freq: Four times a day (QID) | ORAL | Status: DC | PRN
  Administered 2022-10-18 (×2): 650 mg via ORAL
  Filled 2022-10-16: qty 2

## 2022-10-16 MED ORDER — LORAZEPAM 2 MG/ML IJ SOLN
1.0000 mg | Freq: Once | INTRAMUSCULAR | Status: AC
  Administered 2022-10-16: 1 mg via INTRAVENOUS
  Filled 2022-10-16: qty 1

## 2022-10-16 MED ORDER — ANTICOAGULANT SODIUM CITRATE 4% (200MG/5ML) IV SOLN: 5.0000 mL | Status: DC | PRN

## 2022-10-16 MED ORDER — HYDRALAZINE HCL 20 MG/ML IJ SOLN: 10.0000 mg | INTRAMUSCULAR | Status: DC | PRN

## 2022-10-16 MED ORDER — FLUTICASONE FUROATE-VILANTEROL 100-25 MCG/ACT IN AEPB
1.0000 | INHALATION_SPRAY | Freq: Every day | RESPIRATORY_TRACT | Status: DC
  Administered 2022-10-17 – 2022-10-18 (×2): 1 via RESPIRATORY_TRACT
  Filled 2022-10-16: qty 28

## 2022-10-16 MED ORDER — BUSPIRONE HCL 10 MG PO TABS
10.0000 mg | ORAL_TABLET | Freq: Two times a day (BID) | ORAL | Status: DC
  Administered 2022-10-16 – 2022-10-18 (×4): 10 mg via ORAL
  Filled 2022-10-16 (×4): qty 1

## 2022-10-16 MED ORDER — LORAZEPAM 1 MG PO TABS
1.0000 mg | ORAL_TABLET | Freq: Once | ORAL | Status: AC
  Administered 2022-10-16: 1 mg via ORAL
  Filled 2022-10-16: qty 1

## 2022-10-16 MED ORDER — METOPROLOL SUCCINATE ER 25 MG PO TB24
25.0000 mg | ORAL_TABLET | Freq: Every day | ORAL | Status: DC
  Administered 2022-10-16 – 2022-10-18 (×3): 25 mg via ORAL
  Filled 2022-10-16 (×3): qty 1

## 2022-10-16 MED ORDER — INSULIN GLARGINE-YFGN 100 UNIT/ML ~~LOC~~ SOLN
15.0000 [IU] | Freq: Every day | SUBCUTANEOUS | Status: DC
  Administered 2022-10-17: 15 [IU] via SUBCUTANEOUS
  Filled 2022-10-16 (×3): qty 0.15

## 2022-10-16 MED ORDER — HEPARIN SODIUM (PORCINE) 1000 UNIT/ML DIALYSIS: 1000.0000 [IU] | INTRAMUSCULAR | Status: DC | PRN

## 2022-10-16 MED ORDER — LIDOCAINE HCL (PF) 1 % IJ SOLN: 5.0000 mL | INTRAMUSCULAR | Status: DC | PRN

## 2022-10-16 MED ORDER — NITROGLYCERIN 2 % TD OINT
1.0000 [in_us] | TOPICAL_OINTMENT | Freq: Once | TRANSDERMAL | Status: AC
  Administered 2022-10-16: 1 [in_us] via TOPICAL
  Filled 2022-10-16: qty 1

## 2022-10-16 MED ORDER — ASPIRIN 81 MG PO TBEC
81.0000 mg | DELAYED_RELEASE_TABLET | Freq: Every day | ORAL | Status: DC
  Administered 2022-10-16 – 2022-10-18 (×3): 81 mg via ORAL
  Filled 2022-10-16 (×3): qty 1

## 2022-10-16 MED ORDER — TRAZODONE HCL 50 MG PO TABS
50.0000 mg | ORAL_TABLET | Freq: Every day | ORAL | Status: DC
  Administered 2022-10-16 – 2022-10-17 (×2): 50 mg via ORAL
  Filled 2022-10-16 (×2): qty 1

## 2022-10-16 MED ORDER — LIDOCAINE-PRILOCAINE 2.5-2.5 % EX CREA: 1.0000 | TOPICAL_CREAM | CUTANEOUS | Status: DC | PRN

## 2022-10-16 MED ORDER — INSULIN GLARGINE 100 UNIT/ML ~~LOC~~ SOLN
15.0000 [IU] | Freq: Every day | SUBCUTANEOUS | Status: DC
  Filled 2022-10-16: qty 0.15

## 2022-10-16 MED ORDER — ALTEPLASE 2 MG IJ SOLR: 2.0000 mg | Freq: Once | INTRAMUSCULAR | Status: DC | PRN

## 2022-10-16 NOTE — ED Triage Notes (Signed)
Pt reports that he began having sob this am, denies chest pain. States that he is a HD patient with his schedule on MWF. Pt reports he had HD on wed, thurs and Friday last week. Has swelling to BLE up into groin.

## 2022-10-16 NOTE — ED Notes (Signed)
Paduch[place], MD at bedside.  

## 2022-10-16 NOTE — H&P (Addendum)
History and Physical    Timothy Gibson GNF:[id number] DOB: 02/06/1951 DOA: 10/16/2022  Referring MD/NP/PA:   PCP: Larae GroomsHoldsworth, Karen, NP   Patient coming from:  The patient is coming from home.    Chief Complaint: SOB  HPI: Timothy Gibson is a 72 y.o. male with medical history significant of ESRD-HD (MWF), dCHF, HTN, HLD, DM, PVD, BPH, anemia, right kidney cancer, who presents with SOB.  Patient states that he has shortness of breath in the past several days, which has been progressively worsening, much worse since this morning.  Patient has dry cough, no chest pain.  No fever or chills.  Patient does not have nausea vomiting or abdominal pain.  He states that he had constipation recently, used laxative, then started to have loose stool yesterday.  Today has not had bowel movement yet.  Patient has worsening bilateral leg edema. Patient had dialysis on Friday.  Data reviewed independently and ED Course: pt was found to have troponin 105, BNP 667, WBC 4.0, potassium 4.2, bicarbonate 27, creatinine 12.73, BUN 70.  Temperature 97.5, blood pressure 212/63, heart rate 90, RR 22, oxygen saturation 95% on room air (intermittently decreases to 89).  Chest x-ray showed mild pulmonary edema and a right diaphragmatic elevation.  Patient is placed on PCU for observation.  Dr. Thedore MinsSingh of nephrology is consulted.   EKG: I have personally reviewed.  Sinus rhythm, QTc 500, poor R wave progression, LAD, incomplete right bundle blockade, with lot of artificial effects   Review of Systems:   General: no fevers, chills, no body weight gain, fatigue HEENT: no blurry vision, hearing changes or sore throat Respiratory: has dyspnea, coughing, no wheezing CV: no chest pain, no palpitations GI: no nausea, vomiting, abdominal pain, diarrhea, constipation GU: no dysuria, burning on urination, increased urinary frequency, hematuria  Ext: has leg edema Neuro: no unilateral weakness, numbness, or tingling, no vision  change or hearing loss Skin: no rash, no skin tear. MSK: No muscle spasm, no deformity, no limitation of range of movement in spin Heme: No easy bruising.  Travel history: No recent long distant travel.   Allergy:  Allergies  Allergen Reactions   Adhesive [Tape]     Pulls skin off.  Must use paper tape    Past Medical History:  Diagnosis Date   Cancer (HCC)    right kidney   Diabetes mellitus without complication (HCC)    Dialysis patient [facility](HCC)    on Mon-Wed-Fri dialysis   ESRD (end stage renal disease) (HCC)    History of gangrene    Hypertension     Past Surgical History:  Procedure Laterality Date   A/V FISTULAGRAM Left 06/04/2018   Procedure: A/V FISTULAGRAM;  Surgeon: Renford DillsSchnier, Gregory G, MD;  Location: [facility] INVASIVE CV LAB;  Service: Cardiovascular;  Laterality: Left;   COLONOSCOPY WITH PROPOFOL N/A 12/16/2015   Procedure: COLONOSCOPY WITH PROPOFOL;  Surgeon: Scot Junobert T Elliott, MD;  [facility];  Service: Endoscopy;  Laterality: N/A;   KNEE SURGERY     left forearm AV fistula     LOWER EXTREMITY ANGIOGRAPHY Left 07/24/2019   Procedure: LOWER EXTREMITY ANGIOGRAPHY;  Surgeon: Annice Needyew, Jason S, MD;  Location: [facility] INVASIVE CV LAB;  Service: Cardiovascular;  Laterality: Left;   NEPHRECTOMY     PERIPHERAL VASCULAR THROMBECTOMY Left 09/01/2020   Procedure: PERIPHERAL VASCULAR THROMBECTOMY;  Surgeon: Renford DillsSchnier, Gregory G, MD;  Location: [facility] INVASIVE CV LAB;  Service: Cardiovascular;  Laterality: Left;    Social History:  reports that he  has quit smoking. His smoking use included cigars. He has quit using smokeless tobacco. He reports that he does not currently use alcohol. He reports that he does not use drugs.  Family History:  Family History  Problem Relation Age of Onset   Hypertension Father    Colon cancer Father    Hypertension Mother    Parkinson's disease Mother    Diabetes Mother    Alzheimer's disease Mother      Prior to Admission medications    Medication Sig Start Date End Date Taking? Authorizing Provider  busPIRone (BUSPAR) 10 MG tablet Take 1 tablet (10 mg total) by mouth 2 (two) times daily. 09/27/22   Larae Grooms, NP  Fluticasone-Umeclidin-Vilant (TRELEGY ELLIPTA) 100-62.5-25 MCG/ACT AEPB Inhale 1 puff into the lungs daily. 09/28/22   Martina Sinner, MD  Insulin Glargine (BASAGLAR KWIKPEN) 100 UNIT/ML Inject 20 Units into the skin at bedtime. 08/21/22   Larae Grooms, NP  metoprolol succinate (TOPROL-XL) 25 MG 24 hr tablet Take 1 tablet (25 mg total) by mouth daily. 08/22/22 08/22/23  Larae Grooms, NP  sacubitril-valsartan (ENTRESTO) 24-26 MG Take 1 tablet by mouth every 12 (twelve) hours. Patient not taking: Reported on 09/27/2022 05/05/21   [provider]  sevelamer carbonate (RENVELA) 800 MG tablet Take 2,400 mg by mouth 3 (three) times daily with meals.    [provider]    Physical Exam: Vitals:   10/16/22 0715 10/16/22 0716 10/16/22 0800  BP: (!) 202/73  (!) 212/63  Pulse: 90  88  Resp: 18  (!) 22  Temp: (!) 97.5 F (36.4 C)    TempSrc: Oral    SpO2: 94%  95%  Weight:  111.6 kg   Height:   (1.93 m)    General: Not in acute distress HEENT:       Eyes: PERRL, EOMI, no scleral icterus.       ENT: No discharge from the ears and nose, no pharynx injection, no tonsillar enlargement.        Neck: positiveJVD, no bruit, no mass felt. Heme: No neck lymph node enlargement. Cardiac: S1/S2, RRR, No murmurs, No gallops or rubs. Respiratory: has crackles bilaterally GI: Soft, nondistended, nontender, no rebound pain, no organomegaly, BS present. GU: No hematuria Ext: has 3+ pitting leg edema bilaterally. 1+DP/PT pulse bilaterally. Musculoskeletal: No joint deformities, No joint redness or warmth, no limitation of ROM in spin. Skin: No rashes.  Neuro: Alert, oriented X3, cranial nerves II-XII grossly intact, moves all extremities normally. Psych: Patient is not psychotic, no suicidal  or hemocidal ideation.  Labs on Admission: I have personally reviewed following labs and imaging studies  CBC: Recent Labs  Lab 10/16/22 0727  WBC 4.0  HGB 8.5*  HCT 27.7*  MCV 102.2*  PLT 142*   Basic Metabolic Panel: Recent Labs  Lab 10/16/22 0727  NA 143  K 4.2  CL 101  CO2 27  GLUCOSE 266*  BUN 70*  CREATININE 12.73*  CALCIUM 8.9   GFR: Estimated Creatinine Clearance: 7.3 mL/min (A) (by C-G formula based on SCr of 12.73 mg/dL (H)). Liver Function Tests: Recent Labs  Lab 10/16/22 0727  AST 24  ALT 24  ALKPHOS 75  BILITOT 0.8  PROT 7.7  ALBUMIN 3.6   No results for input(s): "LIPASE", "AMYLASE" in the last 168 hours. No results for input(s): "AMMONIA" in the last 168 hours. Coagulation Profile: No results for input(s): "INR", "PROTIME" in the last 168 hours. Cardiac Enzymes: No results for input(s): "  CKTOTAL", "CKMB", "CKMBINDEX", "TROPONINI" in the last 168 hours. BNP (last 3 results) No results for input(s): "PROBNP" in the last 8760 hours. HbA1C: No results for input(s): "HGBA1C" in the last 72 hours. CBG: No results for input(s): "GLUCAP" in the last 168 hours. Lipid Profile: No results for input(s): "CHOL", "HDL", "LDLCALC", "TRIG", "CHOLHDL", "LDLDIRECT" in the last 72 hours. Thyroid Function Tests: No results for input(s): "TSH", "T4TOTAL", "FREET4", "T3FREE", "THYROIDAB" in the last 72 hours. Anemia Panel: No results for input(s): "VITAMINB12", "FOLATE", "FERRITIN", "TIBC", "IRON", "RETICCTPCT" in the last 72 hours. Urine analysis:    Component Value Date/Time   COLORURINE Yellow 07/22/2011 1830   APPEARANCEUR Turbid 07/22/2011 1830   LABSPEC 1.012 07/22/2011 1830   PHURINE 5.0 07/22/2011 1830   GLUCOSEU >=500 07/22/2011 1830   HGBUR 2+ 07/22/2011 1830   BILIRUBINUR Negative 07/22/2011 1830   KETONESUR Negative 07/22/2011 1830   PROTEINUR 100 mg/dL 27/25/3664 4034   NITRITE Negative 07/22/2011 1830   LEUKOCYTESUR Trace 07/22/2011 1830    Sepsis Labs: @LABRCNTIP (procalcitonin:4,lacticidven:4) )No results found for this or any previous visit (from the past 240 hour(s)).   Radiological Exams on Admission: DG Chest Portable 1 View  Result Date: 10/16/2022 CLINICAL DATA:  Acute onset shortness of breath EXAM: PORTABLE CHEST 1 VIEW COMPARISON:  Chest radiograph dated 09/13/2022 FINDINGS: Low lung volumes with unchanged asymmetric elevation of the right hemidiaphragm. Increased perihilar interstitial prominence. No pleural effusion or pneumothorax. Similar cardiomediastinal silhouette. The visualized skeletal structures are unremarkable. Aortic atherosclerosis. IMPRESSION: 1. Mild pulmonary edema. 2. Low lung volumes with unchanged asymmetric elevation of the right hemidiaphragm. 3. Aortic Atherosclerosis (ICD10-I70.0). Electronically Signed   By: Agustin Cree M.D.   On: 10/16/2022 08:11      Assessment/Plan Principal Problem:   Fluid overload Active Problems:   ESRD on dialysis   Anemia in ESRD (end-stage renal disease)   Chronic systolic CHF (congestive heart failure)   COPD (chronic obstructive pulmonary disease)   Type II diabetes mellitus with renal manifestations   HTN (hypertension)   Hypertensive urgency   Myocardial injury   Depression   Assessment and Plan:  Fluid overload and ESRD on dialysis (MWF): pt has shortness of breath, elevated BNP 667, 3+ leg edema, positive JVD, crackles on auscultation, pulm edema on chest x-ray, clinically consistent with fluid overload. Consulted Dr. Thedore Mins of renal for HD.  -Place in PCU for observation -Daily weights -strict I/O's - renal diet -Obtain REDs Vest reading -As needed bronchodilators for shortness of breath  Anemia in ESRD (end-stage renal disease): Hgb stable. 8.0 on 10/06/22 --> 8.5 today -f/u by CBC  Chronic systolic CHF (congestive heart failure): 2D echo on 09/25/2022 showed EF of 55-60% with grade 2 diastolic dysfunction.  Now patient has a fluid  overload. -Volume management per renal by dialysis  COPD (chronic obstructive pulmonary disease) - bronchodilators and as needed Mucinex  Type II diabetes mellitus with renal manifestations: Recent A1c 7.6, poorly controlled.  Patient is taking glargine insulin 20 units daily -Sliding scale insulin -Glargine insulin 15 units daily  HTN (hypertension) and Hypertensive urgency: Bp  212/63. -Nitroglycerin patch 15 mg -IV hydralazine as needed -Continue home metoprolol -add amldopin 5 mg daily  Myocardial injury: Troponin level 105, no chest pain -start ASA 81 mg daiy -trend troop -check A1c and FLP  Depression -Continue home medications.        DVT ppx: SQ Heparin      Code Status: DNR per pt   Family Communication:   Yes,  patient's lady friend at bed side.   Disposition Plan:  Anticipate discharge back to previous environment  Consults called:   Dr. Thedore Mins of nephrology is consulted  Admission status and Level of care: Progressive:   for obs   Dispo: The patient is from: Home              Anticipated d/c is to: Home              Anticipated d/c date is: 1 day              Patient currently is not medically stable to d/c.    Severity of Illness:  The appropriate patient status for this patient is OBSERVATION. Observation status is judged to be reasonable and necessary in order to provide the required intensity of service to ensure the patient's safety. The patient's presenting symptoms, physical exam findings, and initial radiographic and laboratory data in the context of their medical condition is felt to place them at decreased risk for further clinical deterioration. Furthermore, it is anticipated that the patient will be medically stable for discharge from the hospital within 2 midnights of admission.        Date of Service 10/16/2022    Lorretta Harp [facility]   If 7PM-7AM, please contact night-coverage [email or web address] 10/16/2022, 10:06 AM

## 2022-10-16 NOTE — Progress Notes (Signed)
[facility]  ROUNDING NOTE   Subjective:   Timothy Gibson is a 72 year old male with past medical conditions including diabetes, hypertension, renal carcinoma, and end-stage renal disease on hemodialysis.  Patient presents to the emergency department with complaints of shortness of breath and elevated blood pressure.  Patient has been admitted under observation for Fluid overload [E87.70]  Patient is known to our practice and receives outpatient [facility] on a MWF schedule, supervised by Dr. Cherylann RatelLateef.  Patient reports he received dialysis on Wednesday, Thursday, and Friday last week due to increased volume.  Patient's roommate states he has fallen recently and has required more assistance around the house, he is usually independent.  States he has not missed any recent dialysis treatments.  States he takes his medications, antihypertensives, as prescribed.  Denies running out of any medications.  States he has not taken his medication today.  Labs on ED arrival significant for glucose 266, BUN 70, creatinine 12.73 with GFR 4, troponin 105, BNP 667, and hemoglobin 8.6.  Chest x-ray shows mild pulmonary edema.  We have been consulted to provide dialysis during this admission.   Objective:  Vital signs in last 24 hours:  Temp:  [97.4 F (36.3 C)-98 F (36.7 C)] 97.4 F (36.3 C) (04/08 1346) Pulse Rate:  [77-91] 86 (04/08 1500) Resp:  [18-33] 23 (04/08 1500) BP: (168-232)/(56-142) 168/142 (04/08 1500) SpO2:  [92 %-100 %] 97 % (04/08 1500) Weight:  [111.6 kg-122.5 kg] 122.5 kg (04/08 1349)  Weight change:  Filed Weights   10/16/22 0716 10/16/22 1349  Weight: 111.6 kg 122.5 kg    Intake/Output: No intake/output data recorded.   Intake/Output this shift:  No intake/output data recorded.  Physical Exam: General: NAD  Head: Normocephalic, atraumatic. Moist oral mucosal membranes  Eyes: Anicteric  Lungs:  Crackles present, room air  Heart:  Regular rate and rhythm  Abdomen:  Soft, nontender,   Extremities: 2+ peripheral edema.  Neurologic: Nonfocal, moving all four extremities  Skin: No lesions, fluid-filled blisters lower extremities  Access: Lt AVF    Basic Metabolic Panel: Recent Labs  Lab 10/16/22 0727  NA 143  K 4.2  CL 101  CO2 27  GLUCOSE 266*  BUN 70*  CREATININE 12.73*  CALCIUM 8.9    Liver Function Tests: Recent Labs  Lab 10/16/22 0727  AST 24  ALT 24  ALKPHOS 75  BILITOT 0.8  PROT 7.7  ALBUMIN 3.6   No results for input(s): "LIPASE", "AMYLASE" in the last 168 hours. No results for input(s): "AMMONIA" in the last 168 hours.  CBC: Recent Labs  Lab 10/16/22 0727  WBC 4.0  HGB 8.5*  HCT 27.7*  MCV 102.2*  PLT 142*    Cardiac Enzymes: No results for input(s): "CKTOTAL", "CKMB", "CKMBINDEX", "TROPONINI" in the last 168 hours.  BNP: Invalid input(s): "POCBNP"  CBG: Recent Labs  Lab 10/16/22 1224  GLUCAP 205*    Microbiology: Results for orders placed or performed during the hospital encounter of 09/13/22  MRSA Next Gen by PCR, Nasal     Status: None   Collection Time: 09/13/22  4:28 PM   Specimen: Nasal Mucosa; Nasal Swab  Result Value Ref Range Status   MRSA by PCR Next Gen NOT DETECTED NOT DETECTED Final    Comment: (NOTE) The GeneXpert MRSA Assay (FDA approved for NASAL specimens only), is one component of a comprehensive MRSA colonization surveillance program. It is not intended to diagnose MRSA infection nor to guide or  monitor treatment for MRSA infections. Test performance is not FDA approved in patients less than 57 years old. Performed at [facility], [address] [place], [place] [address]     Coagulation Studies: No results for input(s): "LABPROT", "INR" in the last 72 hours.  Urinalysis: No results for input(s): "COLORURINE", "LABSPEC", "PHURINE", "GLUCOSEU", "HGBUR", "BILIRUBINUR", "KETONESUR", "PROTEINUR", "UROBILINOGEN", "NITRITE",  "LEUKOCYTESUR" in the last 72 hours.  Invalid input(s): "APPERANCEUR"    Imaging: DG Chest Portable 1 View  Result Date: 10/16/2022 CLINICAL DATA:  Acute onset shortness of breath EXAM: PORTABLE CHEST 1 VIEW COMPARISON:  Chest radiograph dated 09/13/2022 FINDINGS: Low lung volumes with unchanged asymmetric elevation of the right hemidiaphragm. Increased perihilar interstitial prominence. No pleural effusion or pneumothorax. Similar cardiomediastinal silhouette. The visualized skeletal structures are unremarkable. Aortic atherosclerosis. IMPRESSION: 1. Mild pulmonary edema. 2. Low lung volumes with unchanged asymmetric elevation of the right hemidiaphragm. 3. Aortic Atherosclerosis (ICD10-I70.0). Electronically Signed   By: Agustin Cree M.D.   On: 10/16/2022 08:11     Medications:    anticoagulant sodium citrate      aspirin EC  81 mg Oral Daily   Chlorhexidine Gluconate Cloth  6 each Topical Q0600   heparin  5,000 Units Subcutaneous Q8H   insulin aspart  0-5 Units Subcutaneous QHS   insulin aspart  0-9 Units Subcutaneous TID WC   acetaminophen, albuterol, alteplase, anticoagulant sodium citrate, dextromethorphan-guaiFENesin, diphenhydrAMINE, heparin, hydrALAZINE, lidocaine (PF), lidocaine-prilocaine, pentafluoroprop-tetrafluoroeth  Assessment/ Plan:  Timothy Gibson is a 72 y.o.  male  with past medical conditions including diabetes, hypertension, renal carcinoma, and end-stage renal disease on hemodialysis.  Patient presents to the emergency department with complaints of shortness of breath and elevated blood pressure.  Patient has been admitted under observation for Fluid overload [E87.70]   [facility]/MWF/left aVF  Acute respiratory failure with end-stage renal disease on hemodialysis.  No recent missed dialysis treatments.  Chest x-ray shows mild pulmonary edema.  Will provide scheduled dialysis treatment today, UF 2.5 to 3 L as tolerated.  Patient may also benefit from  UF only treatment tomorrow.  2. Anemia of chronic kidney disease Lab Results  Component Value Date   HGB 8.5 (L) 10/16/2022    Hemoglobin below desired target.  Patient receives Mircera at outpatient clinic.  Will monitor for now.  3. Secondary Hyperparathyroidism: with outpatient labs:None Lab Results  Component Value Date   CALCIUM 8.9 10/16/2022   PHOS 7.6 (H) 09/15/2022    Calcium acceptable at this time.  Patient receives [facility] outpatient with meals.  Will continue to monitor bone minerals during this admission.  4.  Hypertension with chronic kidney disease.  Home regimen includes metoprolol and Entresto.  Has not taken his medications this morning.  These medications remain held.  Blood pressure currently 125/111 during dialysis.   LOS: 0 Stephani Janak 4/8/20243:23 PM

## 2022-10-16 NOTE — Progress Notes (Signed)
Hemodialysis Note  Received patient in bed to unit. Alert and oriented. Informed consent signed and in chart.   Treatment initiated:1348 Treatment completed:1740  Patient tolerated treatment well. Transported back to the room alert, without acute distress. Report given to patient's RN.  Access used: AVF Access issues:None   Total UF removed:2.5 liters/2500 ml Medications given:None Post HD BZ:MCEYEM  Post HD weight:120.2 kg standing   Bartolo Darter, RN [facility]

## 2022-10-16 NOTE — ED Notes (Signed)
Patient assisted to and from toilet for BM. Patient unable to BM at this time.

## 2022-10-16 NOTE — ED Provider Notes (Signed)
[facility] Provider Note    Event Date/Time   First MD Initiated Contact with Patient 10/16/22 [phone]     (approximate)  History   Chief Complaint: Shortness of Breath and Leg Swelling  HPI  Timothy Gibson is a 72 y.o. male with a past medical history of diabetes, hypertension, ESRD on HD Monday/Wednesday/Friday who presents to the emergency department for worsening swelling and shortness of breath.  According to the patient he last received dialysis on Friday, however over the weekend he has been experiencing increasing shortness of breath as well as lower extremity edema extending up to his groin per patient.  Patient denies any chest pain.  No fever.  Patient is hypertensive 202/73 but has not yet taken his blood pressure medications as he would normally have had dialysis today.  Physical Exam   Triage Vital Signs: ED Triage Vitals  Enc Vitals Group     BP 10/16/22 0715 (!) 202/73     Pulse Rate 10/16/22 0715 90     Resp 10/16/22 0715 18     Temp 10/16/22 0715 (!) 97.5 F (36.4 C)     Temp Source 10/16/22 0715 Oral     SpO2 10/16/22 0715 94 %     Weight 10/16/22 0716 246 lb (111.6 kg)     Height 10/16/22 0716 6\' 4"  (1.93 m)     Head Circumference --      Peak Flow --      Pain Score 10/16/22 0715 0     Pain Loc --      Pain Edu? --      Excl. in GC? --     Most recent vital signs: Vitals:   10/16/22 0715  BP: (!) 202/73  Pulse: 90  Resp: 18  Temp: (!) 97.5 F (36.4 C)  SpO2: 94%    General: Awake, no distress.  CV:  Good peripheral perfusion.  Regular rate and rhythm  Resp:  Normal effort.  Equal breath sounds bilaterally.  Abd:  No distention.  Soft, nontender.  No rebound or guarding. Other:  3+ pitting edema bilaterally to the lower extremities.   ED Results / Procedures / Treatments   EKG  EKG viewed and interpreted by myself shows what appears to be atrial fibrillation around 90 bpm with a borderline widened QRS, left axis  deviation, largely normal intervals with nonspecific ST changes without ST elevation.  RADIOLOGY  I have reviewed and interpreted the chest x-ray images.  Patient appears to have a mild amount of fluid on the lungs. Radiology has read the x-ray as mild pulmonary edema.   MEDICATIONS ORDERED IN ED: Medications - No data to display   IMPRESSION / MDM / ASSESSMENT AND PLAN / ED COURSE  I reviewed the triage vital signs and the nursing notes.  Patient's presentation is most consistent with acute presentation with potential threat to life or bodily function.  Patient presents emergency department for shortness of breath.  Overall the patient appears well, no distress.  Does have 3+ lower extremity edema bilaterally.  No significant groin/scrotal edema noted on physical exam.  We will check labs, chest x-ray and continue to closely monitor.  Patient's chest x-ray confirms mild pulmonary edema.  Patient continues to be tachypneic around 25 breaths/min although is also anxious.  We will dose additional Ativan.  Given the patient's pulmonary edema with increased respiratory effort hypertension we will dose 1 inch of nitroglycerin paste.  Troponin moderately elevated we will continue  to closely monitor and trend.  Chemistry shows no significant findings, CBC largely unchanged from baseline with chronic anemia.  Given the patient's pulmonary edema and increased respiratory rate hypertension elevated troponin we will admit to the hospitalist service for further workup and treatment.  I will discuss with nephrology to help arrange for dialysis.    FINAL CLINICAL IMPRESSION(S) / ED DIAGNOSES   Hypertension Fluid overload Elevated troponin  Note:  This document was prepared using Dragon voice recognition software and may include unintentional dictation errors.   Minna Antis, MD 10/16/22 1439

## 2022-10-17 ENCOUNTER — Inpatient Hospital Stay: Payer: Medicare Other

## 2022-10-17 DIAGNOSIS — I160 Hypertensive urgency: Secondary | ICD-10-CM | POA: Diagnosis present

## 2022-10-17 DIAGNOSIS — Z992 Dependence on renal dialysis: Secondary | ICD-10-CM | POA: Diagnosis not present

## 2022-10-17 DIAGNOSIS — R0602 Shortness of breath: Secondary | ICD-10-CM | POA: Diagnosis not present

## 2022-10-17 DIAGNOSIS — Z820 Family history of epilepsy and other diseases of the nervous system: Secondary | ICD-10-CM | POA: Diagnosis not present

## 2022-10-17 DIAGNOSIS — N400 Enlarged prostate without lower urinary tract symptoms: Secondary | ICD-10-CM | POA: Diagnosis present

## 2022-10-17 DIAGNOSIS — Z85528 Personal history of other malignant neoplasm of kidney: Secondary | ICD-10-CM | POA: Diagnosis not present

## 2022-10-17 DIAGNOSIS — J9600 Acute respiratory failure, unspecified whether with hypoxia or hypercapnia: Secondary | ICD-10-CM | POA: Diagnosis present

## 2022-10-17 DIAGNOSIS — I5A Non-ischemic myocardial injury (non-traumatic): Secondary | ICD-10-CM | POA: Diagnosis present

## 2022-10-17 DIAGNOSIS — N2581 Secondary hyperparathyroidism of renal origin: Secondary | ICD-10-CM | POA: Diagnosis present

## 2022-10-17 DIAGNOSIS — Z794 Long term (current) use of insulin: Secondary | ICD-10-CM | POA: Diagnosis not present

## 2022-10-17 DIAGNOSIS — N186 End stage renal disease: Secondary | ICD-10-CM | POA: Diagnosis present

## 2022-10-17 DIAGNOSIS — Z66 Do not resuscitate: Secondary | ICD-10-CM | POA: Diagnosis present

## 2022-10-17 DIAGNOSIS — Z8249 Family history of ischemic heart disease and other diseases of the circulatory system: Secondary | ICD-10-CM | POA: Diagnosis not present

## 2022-10-17 DIAGNOSIS — I10 Essential (primary) hypertension: Secondary | ICD-10-CM | POA: Diagnosis not present

## 2022-10-17 DIAGNOSIS — E785 Hyperlipidemia, unspecified: Secondary | ICD-10-CM | POA: Diagnosis present

## 2022-10-17 DIAGNOSIS — I5022 Chronic systolic (congestive) heart failure: Secondary | ICD-10-CM

## 2022-10-17 DIAGNOSIS — J449 Chronic obstructive pulmonary disease, unspecified: Secondary | ICD-10-CM | POA: Diagnosis present

## 2022-10-17 DIAGNOSIS — I5033 Acute on chronic diastolic (congestive) heart failure: Secondary | ICD-10-CM | POA: Diagnosis present

## 2022-10-17 DIAGNOSIS — I132 Hypertensive heart and chronic kidney disease with heart failure and with stage 5 chronic kidney disease, or end stage renal disease: Secondary | ICD-10-CM | POA: Diagnosis present

## 2022-10-17 DIAGNOSIS — E1151 Type 2 diabetes mellitus with diabetic peripheral angiopathy without gangrene: Secondary | ICD-10-CM | POA: Diagnosis present

## 2022-10-17 DIAGNOSIS — E1122 Type 2 diabetes mellitus with diabetic chronic kidney disease: Secondary | ICD-10-CM | POA: Diagnosis present

## 2022-10-17 DIAGNOSIS — E8770 Fluid overload, unspecified: Secondary | ICD-10-CM | POA: Diagnosis present

## 2022-10-17 DIAGNOSIS — R600 Localized edema: Secondary | ICD-10-CM | POA: Diagnosis present

## 2022-10-17 DIAGNOSIS — Z87891 Personal history of nicotine dependence: Secondary | ICD-10-CM | POA: Diagnosis not present

## 2022-10-17 DIAGNOSIS — F32A Depression, unspecified: Secondary | ICD-10-CM | POA: Diagnosis present

## 2022-10-17 DIAGNOSIS — Z800 Family history of malignant neoplasm of digestive organs: Secondary | ICD-10-CM | POA: Diagnosis not present

## 2022-10-17 DIAGNOSIS — Z905 Acquired absence of kidney: Secondary | ICD-10-CM | POA: Diagnosis not present

## 2022-10-17 DIAGNOSIS — D631 Anemia in chronic kidney disease: Secondary | ICD-10-CM | POA: Diagnosis present

## 2022-10-17 LAB — CBC
HCT: 25.4 % — ABNORMAL LOW (ref 39.0–52.0)
Hemoglobin: 7.8 g/dL — ABNORMAL LOW (ref 13.0–17.0)
MCH: 31 pg (ref 26.0–34.0)
MCHC: 30.7 g/dL (ref 30.0–36.0)
MCV: 100.8 fL — ABNORMAL HIGH (ref 80.0–100.0)
Platelets: 143 10*3/uL — ABNORMAL LOW (ref 150–400)
RBC: 2.52 MIL/uL — ABNORMAL LOW (ref 4.22–5.81)
RDW: 13.3 % (ref 11.5–15.5)
WBC: 3.1 10*3/uL — ABNORMAL LOW (ref 4.0–10.5)
nRBC: 0 % (ref 0.0–0.2)

## 2022-10-17 LAB — BASIC METABOLIC PANEL
Anion gap: 11 (ref 5–15)
BUN: 44 mg/dL — ABNORMAL HIGH (ref 8–23)
CO2: 28 mmol/L (ref 22–32)
Calcium: 8.7 mg/dL — ABNORMAL LOW (ref 8.9–10.3)
Chloride: 101 mmol/L (ref 98–111)
Creatinine, Ser: 8.76 mg/dL — ABNORMAL HIGH (ref 0.61–1.24)
GFR, Estimated: 6 mL/min — ABNORMAL LOW (ref >60)
Glucose, Bld: 290 mg/dL — ABNORMAL HIGH (ref 70–99)
Potassium: 4.3 mmol/L (ref 3.5–5.1)
Sodium: 140 mmol/L (ref 135–145)

## 2022-10-17 LAB — TROPONIN I (HIGH SENSITIVITY): Troponin I (High Sensitivity): 85 ng/L — ABNORMAL HIGH (ref <18)

## 2022-10-17 LAB — HEPATITIS B SURFACE ANTIBODY, QUANTITATIVE: Hep B S AB Quant (Post): 11.4 m[IU]/mL (ref >9.9)

## 2022-10-17 LAB — GLUCOSE, CAPILLARY
Glucose-Capillary: 226 mg/dL — ABNORMAL HIGH (ref 70–99)
Glucose-Capillary: 208 mg/dL — ABNORMAL HIGH (ref 70–99)
Glucose-Capillary: 279 mg/dL — ABNORMAL HIGH (ref 70–99)

## 2022-10-17 NOTE — TOC Initial Note (Signed)
Transition of Care [facility]) - Initial/Assessment Note    Patient Details  Name: Timothy Gibson MRN: [id number] Date of Birth: 1950-11-11  Transition of Care [facility]) CM/SW Contact:    Truddie Hidden, RN Phone Number: 10/17/2022, 2:54 PM  Clinical Narrative:                 Case reviewed for DME needs and changes in discharge disposition.         Patient Goals and CMS Choice            Expected Discharge Plan and Services                                              Prior Living Arrangements/Services                       Activities of Daily Living      Permission Sought/Granted                  Emotional Assessment              Admission diagnosis:  Peripheral edema [R60.0] Fluid overload [E87.70] Hypervolemia, unspecified hypervolemia type [E87.70] Dyspnea, unspecified type [R06.00] Patient Active Problem List   Diagnosis Date Noted   Hypertensive urgency 10/16/2022   Myocardial injury 10/16/2022   Anemia in ESRD (end-stage renal disease) 10/16/2022   Depression 10/16/2022   ESRD on dialysis 10/16/2022   Malnutrition of moderate degree 09/14/2022   Fluid overload 09/13/2022   Chronic systolic CHF (congestive heart failure) 09/13/2022   Pancytopenia 09/13/2022   Type II diabetes mellitus with renal manifestations 09/13/2022   HTN (hypertension) 09/13/2022   Conjunctivitis 12/02/2021   Atherosclerosis of native arteries of the extremities with ulceration 08/26/2019   Dry gangrene 08/11/2019   Dialysis patient 11/19/2018   Chronic diastolic heart failure 08/20/2018   History of renal carcinoma 08/15/2018   Complication from renal dialysis device 06/27/2018   Atherosclerosis of native arteries of extremity with intermittent claudication 12/21/2017   Hypertension associated with diabetes 12/21/2017   End stage renal disease 12/21/2017   COPD (chronic obstructive pulmonary disease) 12/21/2017   Acute cystitis without hematuria  09/19/2012   ED (erectile dysfunction) of organic origin 09/19/2012   Benign localized prostatic hyperplasia with lower urinary tract symptoms (LUTS) 09/18/2012   Gross hematuria 09/18/2012   PCP:  Larae Grooms, NP Pharmacy:   [facility] [phone] [place], [place] - [address] AT [facility] [address] [place] [address] [place] [address] Phone: [phone] Fax: [phone]     Social Determinants of Health (SDOH) Social History: SDOH Screenings   Food Insecurity: No Food Insecurity (09/19/2022)  Housing: Low Risk  (09/19/2022)  Transportation Needs: No Transportation Needs (09/19/2022)  Utilities: Not At Risk (09/13/2022)  Alcohol Screen: Low Risk  (02/02/2022)  Depression (PHQ2-9): Low Risk  (02/02/2022)  Financial Resource Strain: Low Risk  (02/02/2022)  Physical Activity: Inactive (02/02/2022)  Social Connections: Socially Isolated (02/02/2022)  Stress: No Stress Concern Present (02/02/2022)  Tobacco Use: Medium Risk (10/16/2022)   SDOH Interventions:     Readmission Risk Interventions     No data to display

## 2022-10-17 NOTE — Progress Notes (Addendum)
[facility]  ROUNDING NOTE   Subjective:   Timothy Gibson is a 72 year old male with past medical conditions including diabetes, hypertension, renal carcinoma, and end-stage renal disease on hemodialysis.  Patient presents to the emergency department with complaints of shortness of breath and elevated blood pressure.  Patient has been admitted under observation for Peripheral edema [R60.0] Fluid overload [E87.70] Hypervolemia, unspecified hypervolemia type [E87.70] Dyspnea, unspecified type [R06.00]  Patient is known to our practice and receives outpatient dialysis treatments at [facility] on a MWF schedule, supervised by Dr. Cherylann Ratel.    Patient seen and evaluated during dialysis   HEMODIALYSIS FLOWSHEET:  Blood Flow Rate (mL/min): 350 mL/min Arterial Pressure (mmHg): -130 mmHg Venous Pressure (mmHg): 150 mmHg TMP (mmHg): 25 mmHg Ultrafiltration Rate (mL/min): 1433 mL/min Dialysate Flow Rate (mL/min): 300 ml/min  Tolerating well, seated in chair Receiving UF only treatment  Objective:  Vital signs in last 24 hours:  Temp:  [97.4 F (36.3 C)-98 F (36.7 C)] 97.9 F (36.6 C) (04/09 0916) Pulse Rate:  [70-96] 74 (04/09 1100) Resp:  [16-33] 17 (04/09 1100) BP: (125-214)/(53-142) 158/69 (04/09 1100) SpO2:  [91 %-100 %] 95 % (04/09 1100) Weight:  [120 kg-123.7 kg] 120 kg (04/09 0916)  Weight change:  Filed Weights   10/16/22 1823 10/17/22 0311 10/17/22 0916  Weight: 120.2 kg 123.7 kg 120 kg    Intake/Output: I/O last 3 completed shifts: In: 0  Out: 2500 [Other:2500]   Intake/Output this shift:  No intake/output data recorded.  Physical Exam: General: NAD  Head: Normocephalic, atraumatic. Moist oral mucosal membranes  Eyes: Anicteric  Lungs:  Basilar crackles present, room air  Heart: Regular rate and rhythm  Abdomen:  Soft, nontender,   Extremities: 2+ peripheral edema.  Neurologic: Nonfocal, moving all four extremities  Skin: No lesions,  fluid-filled blisters lower extremities  Access: Lt AVF    Basic Metabolic Panel: Recent Labs  Lab 10/16/22 0727 10/17/22 0525  NA 143 140  K 4.2 4.3  CL 101 101  CO2 27 28  GLUCOSE 266* 290*  BUN 70* 44*  CREATININE 12.73* 8.76*  CALCIUM 8.9 8.7*     Liver Function Tests: Recent Labs  Lab 10/16/22 0727  AST 24  ALT 24  ALKPHOS 75  BILITOT 0.8  PROT 7.7  ALBUMIN 3.6    No results for input(s): "LIPASE", "AMYLASE" in the last 168 hours. No results for input(s): "AMMONIA" in the last 168 hours.  CBC: Recent Labs  Lab 10/16/22 0727 10/17/22 0525  WBC 4.0 3.1*  HGB 8.5* 7.8*  HCT 27.7* 25.4*  MCV 102.2* 100.8*  PLT 142* 143*     Cardiac Enzymes: No results for input(s): "CKTOTAL", "CKMB", "CKMBINDEX", "TROPONINI" in the last 168 hours.  BNP: Invalid input(s): "POCBNP"  CBG: Recent Labs  Lab 10/16/22 1224 10/16/22 1840 10/16/22 2149 10/17/22 0739  GLUCAP 205* 109* 164* 226*     Microbiology: Results for orders placed or performed during the hospital encounter of 09/13/22  MRSA Next Gen by PCR, Nasal     Status: None   Collection Time: 09/13/22  4:28 PM   Specimen: Nasal Mucosa; Nasal Swab  Result Value Ref Range Status   MRSA by PCR Next Gen NOT DETECTED NOT DETECTED Final    Comment: (NOTE) The GeneXpert MRSA Assay (FDA approved for NASAL specimens only), is one component of a comprehensive MRSA colonization surveillance program. It is not intended to diagnose MRSA infection nor to guide or monitor treatment for MRSA infections.  Test performance is not FDA approved in patients less than 70 years old. Performed at [facility], [address] [place], [place] [address]     Coagulation Studies: No results for input(s): "LABPROT", "INR" in the last 72 hours.  Urinalysis: No results for input(s): "COLORURINE", "LABSPEC", "PHURINE", "GLUCOSEU", "HGBUR", "BILIRUBINUR", "KETONESUR", "PROTEINUR", "UROBILINOGEN", "NITRITE",  "LEUKOCYTESUR" in the last 72 hours.  Invalid input(s): "APPERANCEUR"    Imaging: DG Chest Portable 1 View  Result Date: 10/16/2022 CLINICAL DATA:  Acute onset shortness of breath EXAM: PORTABLE CHEST 1 VIEW COMPARISON:  Chest radiograph dated 09/13/2022 FINDINGS: Low lung volumes with unchanged asymmetric elevation of the right hemidiaphragm. Increased perihilar interstitial prominence. No pleural effusion or pneumothorax. Similar cardiomediastinal silhouette. The visualized skeletal structures are unremarkable. Aortic atherosclerosis. IMPRESSION: 1. Mild pulmonary edema. 2. Low lung volumes with unchanged asymmetric elevation of the right hemidiaphragm. 3. Aortic Atherosclerosis (ICD10-I70.0). Electronically Signed   By: Agustin Cree M.D.   On: 10/16/2022 08:11     Medications:    anticoagulant sodium citrate      amLODipine  5 mg Oral Daily   aspirin EC  81 mg Oral Daily   busPIRone  10 mg Oral BID   Chlorhexidine Gluconate Cloth  6 each Topical Q0600   fluticasone furoate-vilanterol  1 puff Inhalation Daily   And   umeclidinium bromide  1 puff Inhalation Daily   heparin  5,000 Units Subcutaneous Q8H   insulin aspart  0-5 Units Subcutaneous QHS   insulin aspart  0-9 Units Subcutaneous TID WC   insulin glargine-yfgn  15 Units Subcutaneous QHS   metoprolol succinate  25 mg Oral Daily   sevelamer carbonate  2,400 mg Oral TID WC   traZODone  50 mg Oral QHS   acetaminophen, albuterol, alteplase, anticoagulant sodium citrate, dextromethorphan-guaiFENesin, diphenhydrAMINE, heparin, hydrALAZINE, lidocaine (PF), lidocaine-prilocaine, pentafluoroprop-tetrafluoroeth  Assessment/ Plan:  Timothy Gibson is a 72 y.o.  male  with past medical conditions including diabetes, hypertension, renal carcinoma, and end-stage renal disease on hemodialysis.  Patient presents to the emergency department with complaints of shortness of breath and elevated blood pressure.  Patient has been admitted under  observation for Peripheral edema [R60.0] Fluid overload [E87.70] Hypervolemia, unspecified hypervolemia type [E87.70] Dyspnea, unspecified type [R06.00]   [facility]/MWF/left aVF  Acute respiratory failure with end-stage renal disease on hemodialysis.  No recent missed dialysis treatments.  Chest x-ray shows mild pulmonary edema.   Dialysis received yesterday, UF 2.5L achieved. Patient remains above dry weight, will provide UF only treatment today with UF goal 3-3.5L as tolerated.  Will order 2 view chest x-ray to assess pulmonary edema.  Next treatment scheduled for Wednesday.   2. Anemia of chronic kidney disease Lab Results  Component Value Date   HGB 7.8 (L) 10/17/2022    Patient receives Mircera at outpatient clinic.  Hgb below desired target, will continue to monitor for now.   3. Secondary Hyperparathyroidism: with outpatient labs:None Lab Results  Component Value Date   CALCIUM 8.7 (L) 10/17/2022   PHOS 7.6 (H) 09/15/2022    Patient receives [employer] outpatient with meals.  Will continue to monitor bone minerals during this admission.  4.  Hypertension with chronic kidney disease.  Home regimen includes metoprolol and Entresto.  Has not taken his medications this morning.  These medications remain held.  Blood pressure improving with dialysis, 154/77   LOS: 0 Teyah Rossy 4/9/202411:24 AM

## 2022-10-17 NOTE — Progress Notes (Signed)
[facility]  - [place] at [facility]   PATIENT NAME: Timothy Gibson    MR#:  [id number]  DATE OF BIRTH:  [birth year]  SUBJECTIVE:   Patient seen at dialysis. Breathing comfortably. Had come in with increasing shortness of breath and leg swelling. Dialysis  today with UF  Vitals stable.   VITALS:  Blood pressure (!) 158/69, pulse 74, temperature 97.9 F (36.6 C), temperature source Oral, resp. rate 17, height 6\' 4"  (1.93 m), weight 120 kg, SpO2 95 %.  PHYSICAL EXAMINATION:   GENERAL:  72 y.o.-year-old patient with no acute distress.  LUNGS: decreased  breath sounds bilaterally, no wheezing CARDIOVASCULAR: S1, S2 normal. No murmur   ABDOMEN: Soft, nontender, nondistended. Bowel sounds present.  EXTREMITIES: chronic leg edema b/l.    NEUROLOGIC: nonfocal  patient is alert and awake SKIN: No obvious rash, lesion, or ulcer.   LABORATORY PANEL:  CBC Recent Labs  Lab 10/17/22 0525  WBC 3.1*  HGB 7.8*  HCT 25.4*  PLT 143*    Chemistries  Recent Labs  Lab 10/16/22 0727 10/17/22 0525  NA 143 140  K 4.2 4.3  CL 101 101  CO2 27 28  GLUCOSE 266* 290*  BUN 70* 44*  CREATININE 12.73* 8.76*  CALCIUM 8.9 8.7*  AST 24  --   ALT 24  --   ALKPHOS 75  --   BILITOT 0.8  --     RADIOLOGY:  DG Chest Portable 1 View  Result Date: 10/16/2022 CLINICAL DATA:  Acute onset shortness of breath EXAM: PORTABLE CHEST 1 VIEW COMPARISON:  Chest radiograph dated 09/13/2022 FINDINGS: Low lung volumes with unchanged asymmetric elevation of the right hemidiaphragm. Increased perihilar interstitial prominence. No pleural effusion or pneumothorax. Similar cardiomediastinal silhouette. The visualized skeletal structures are unremarkable. Aortic atherosclerosis. IMPRESSION: 1. Mild pulmonary edema. 2. Low lung volumes with unchanged asymmetric elevation of the right hemidiaphragm. 3. Aortic Atherosclerosis (ICD10-I70.0). Electronically Signed   By: Agustin Cree M.D.   On: 10/16/2022 08:11     Assessment and Plan Timothy Gibson is a 72 y.o. male with medical history significant of ESRD-HD (MWF), dCHF, HTN, HLD, DM, PVD, BPH, anemia, right kidney cancer, who presents with SOB.  Patient states that he has shortness of breath in the past several days, which has been progressively worsening, much worse since this morning.  Patient has dry cough, no chest pain Chest x-ray showed mild pulmonary edema and a right diaphragmatic elevation.    Dr. Thedore Mins of nephrology is consulted.   Fluid overload and ESRD on dialysis (MWF): pt has shortness of breath, elevated BNP 667, 3+ leg edema, positive JVD, crackles on auscultation, pulm edema on chest x-ray, clinically consistent with fluid overload.  --Consulted Dr. Thedore Mins of renal for HD. --HD x 2 sessions with UF --will likely need additional HD in am  -As needed bronchodilators for shortness of breath --breathing better  --sats 93-95% on RA   Anemia in ESRD (end-stage renal disease):  --Hgb stable. 8.0 on 10/06/22 --> 8.5 today   Chronic systolic CHF (congestive heart failure): 2D echo on 09/25/2022 showed EF of 55-60% with grade 2 diastolic dysfunction.  Now patient has a fluid overload. -Volume management per renal by dialysis   COPD (chronic obstructive pulmonary disease) -bronchodilators and as needed Mucinex   Type II diabetes mellitus with renal manifestations: Recent A1c 7.6, poorly controlled.   --Patient is taking glargine insulin 20 units daily -Sliding scale insulin -Glargine insulin 15 units daily   HTN (  hypertension) and Hypertensive urgency: Bp  212/63. -Nitroglycerin patch 15 mg -IV hydralazine as needed -Continue home metoprolol -add amldopine 5 mg daily   Myocardial injury: Troponin level 105, no chest pain--appears demand sichemia -start ASA 81 mg daiy -trend troponin going down  Depression -Continue home medications.   PT/Ot to see pt  Procedures: Family communication :none today Consults  :Nephrology CODE STATUS: DNR (per pt) DVT Prophylaxis : heparin  EDD 1 to 2 days    TOTAL TIME TAKING CARE OF THIS PATIENT: 35 minutes.  >50% time spent on counselling and coordination of care  Note: This dictation was prepared with Dragon dictation along with smaller phrase technology. Any transcriptional errors that result from this process are unintentional.  Enedina Finner M.D    [facility]   CC: Primary care physician; Larae Grooms, NP

## 2022-10-17 NOTE — Progress Notes (Signed)
  Received patient in bed to unit.   Informed consent signed and in chart.    TX duration: 3hrs     Transported back to floor  Hand-off given to patient's nurse. No c/o and  no distress noted    Access used: LAVF Access issues: none   Total UF removed: 3.5KG Medication(s) given: none Post HD VS: stable Post HD weight: 116.1kg     Lynann Beaver  [facility]

## 2022-10-18 DIAGNOSIS — I5033 Acute on chronic diastolic (congestive) heart failure: Secondary | ICD-10-CM | POA: Diagnosis not present

## 2022-10-18 LAB — CBC
HCT: 25.3 % — ABNORMAL LOW (ref 39.0–52.0)
Hemoglobin: 8 g/dL — ABNORMAL LOW (ref 13.0–17.0)
MCH: 31.9 pg (ref 26.0–34.0)
MCHC: 31.6 g/dL (ref 30.0–36.0)
MCV: 100.8 fL — ABNORMAL HIGH (ref 80.0–100.0)
Platelets: 143 10*3/uL — ABNORMAL LOW (ref 150–400)
RBC: 2.51 MIL/uL — ABNORMAL LOW (ref 4.22–5.81)
RDW: 13.4 % (ref 11.5–15.5)
WBC: 3.4 10*3/uL — ABNORMAL LOW (ref 4.0–10.5)
nRBC: 0 % (ref 0.0–0.2)

## 2022-10-18 LAB — RENAL FUNCTION PANEL
Albumin: 3 g/dL — ABNORMAL LOW (ref 3.5–5.0)
Anion gap: 12 (ref 5–15)
BUN: 38 mg/dL — ABNORMAL HIGH (ref 8–23)
CO2: 28 mmol/L (ref 22–32)
Calcium: 9 mg/dL (ref 8.9–10.3)
Chloride: 98 mmol/L (ref 98–111)
Creatinine, Ser: 7.67 mg/dL — ABNORMAL HIGH (ref 0.61–1.24)
GFR, Estimated: 7 mL/min — ABNORMAL LOW (ref >60)
Glucose, Bld: 313 mg/dL — ABNORMAL HIGH (ref 70–99)
Phosphorus: 6.3 mg/dL — ABNORMAL HIGH (ref 2.5–4.6)
Potassium: 4.2 mmol/L (ref 3.5–5.1)
Sodium: 138 mmol/L (ref 135–145)

## 2022-10-18 LAB — HEMOGLOBIN A1C
Hgb A1c MFr Bld: 7.4 % — ABNORMAL HIGH (ref 4.8–5.6)
Mean Plasma Glucose: 166 mg/dL

## 2022-10-18 LAB — GLUCOSE, CAPILLARY
Glucose-Capillary: 323 mg/dL — ABNORMAL HIGH (ref 70–99)
Glucose-Capillary: 174 mg/dL — ABNORMAL HIGH (ref 70–99)

## 2022-10-18 MED ORDER — ACETAMINOPHEN 325 MG PO TABS
ORAL_TABLET | ORAL | Status: AC
  Filled 2022-10-18: qty 2

## 2022-10-18 MED ORDER — AMLODIPINE BESYLATE 5 MG PO TABS: 5.0000 mg | ORAL_TABLET | Freq: Every day | ORAL | 0 refills | Status: DC

## 2022-10-18 MED ORDER — INSULIN LISPRO 100 UNIT/ML IJ SOLN: INTRAMUSCULAR | 3 refills | Status: DC

## 2022-10-18 NOTE — Discharge Summary (Signed)
Physician Discharge Summary   Patient: Timothy Gibson MRN: [id number] DOB: 09/22/1950  Admit date:     10/16/2022  Discharge date: 10/18/22  Discharge Physician: Humna Moorehouse   PCP: Larae Grooms, NP   Recommendations at discharge:   Take medications as recommended  Discharge Diagnoses: Principal Problem:   Acute on chronic diastolic CHF (congestive heart failure) Active Problems:   Fluid overload   ESRD on dialysis   COPD (chronic obstructive pulmonary disease)   Anemia in ESRD (end-stage renal disease)   Type II diabetes mellitus with renal manifestations   HTN (hypertension)   Hypertensive urgency   Myocardial injury   Depression  Resolved Problems:   * No resolved hospital problems. [facility] Course: No notes on file  Timothy Gibson is a 72 y.o. male with medical history significant of ESRD-HD (MWF), dCHF, HTN, HLD, DM, PVD, BPH, anemia, right kidney cancer, who presents with SOB.   Patient stated that he had shortness of breath in the past several days, which has been progressively worsening, much worse since this morning.  Patient has dry cough, no chest pain.  No fever or chills.  Patient does not have nausea vomiting or abdominal pain.  He states that he had constipation recently, used laxative, then started to have loose stool yesterday.  Today has not had bowel movement yet.  Patient has worsening bilateral leg edema. Patient had dialysis on Friday.   Data reviewed independently and ED Course: pt was found to have troponin 105, BNP 667, WBC 4.0, potassium 4.2, bicarbonate 27, creatinine 12.73, BUN 70.  Temperature 97.5, blood pressure 212/63, heart rate 90, RR 22, oxygen saturation 95% on room air (intermittently decreases to 89).  Chest x-ray showed mild pulmonary edema and a right diaphragmatic elevation.  Patient is placed on PCU for observation.  Dr. Thedore Mins of nephrology is consulted. Assessment and Plan: Acute on chronic diastolic dysfunction CHF Fluid  overload and ESRD on dialysis (MWF):  pt presented for evaluation of shortness of breath, elevated BNP 667, 3+ leg edema, positive JVD, crackles on auscultation, pulm edema on chest x-ray, clinically consistent with fluid overload.  Last known LVEF was 55 to 60% with grade 2 diastolic dysfunction CHF from a 2D echocardiogram which was done 03/24 Nephrology was consulted Patient admitted to being compliant with his dialysis sessions He had 2 dialysis sessions and will be discharged home    Anemia in ESRD (end-stage renal disease):  --Hgb stable. 8.0 on 10/06/22 --> 8.5 today      COPD (chronic obstructive pulmonary disease) -bronchodilators and as needed Mucinex   Type II diabetes mellitus with renal manifestations: Recent A1c 7.6, poorly controlled.   -- Continue long-acting insulin 20 units at bedtime to optimize glycemic control Will add Humalog 2 units with meals    HTN (hypertension) and Hypertensive urgency: Bp  212/63 on admission -Improved blood pressure control    Myocardial injury:  Troponin level 105, no chest pain--appears demand ischemia from uncontrolled hypertension -Continue ASA 81 mg daiy    Depression -Continue Buspar        Consultants: Nephrology Procedures performed: Renal replacement therapy Disposition: Home Diet recommendation:  Renal diet/carb modified DISCHARGE MEDICATION: Allergies as of 10/18/2022       Reactions   Adhesive [tape]    Pulls skin off.  Must use paper tape        Medication List     STOP taking these medications    sacubitril-valsartan 24-26 MG Commonly known as:  ENTRESTO       TAKE these medications    amLODipine 5 MG tablet Commonly known as: NORVASC Take 1 tablet (5 mg total) by mouth daily. Start taking on: October 19, 2022   Basaglar KwikPen 100 UNIT/ML Inject 20 Units into the skin at bedtime.   busPIRone 10 MG tablet Commonly known as: BUSPAR Take 1 tablet (10 mg total) by mouth 2 (two) times  daily.   insulin lispro 100 UNIT/ML injection Commonly known as: HumaLOG Inject 2 units with meals   metoprolol succinate 25 MG 24 hr tablet Commonly known as: TOPROL-XL Take 1 tablet (25 mg total) by mouth daily.   sevelamer carbonate 800 MG tablet Commonly known as: RENVELA Take 2,400 mg by mouth 3 (three) times daily with meals.   traZODone 50 MG tablet Commonly known as: DESYREL Take 50 mg by mouth at bedtime.   Trelegy Ellipta 100-62.5-25 MCG/ACT Aepb Generic drug: Fluticasone-Umeclidin-Vilant Inhale 1 puff into the lungs daily.        Discharge Exam: Filed Weights   10/18/22 0358 10/18/22 0829 10/18/22 1247  Weight: 110.1 kg 117.3 kg 115.1 kg    GENERAL:  72 y.o.-year-old chronically ill-appearing with no acute distress.  LUNGS: decreased  breath sounds bilaterally, no wheezing CARDIOVASCULAR: S1, S2 normal. No murmur   ABDOMEN: Soft, nontender, nondistended. Bowel sounds present.  EXTREMITIES: chronic leg edema b/l.    NEUROLOGIC: nonfocal  patient is alert and awake SKIN: No obvious rash, lesion, or ulcer.    Condition at discharge: stable  The results of significant diagnostics from this hospitalization (including imaging, microbiology, ancillary and laboratory) are listed below for reference.   Imaging Studies: DG Chest 2 View  Result Date: 10/17/2022 CLINICAL DATA:  Shortness of breath EXAM: CHEST - 2 VIEW COMPARISON:  Yesterday FINDINGS: Both views are degraded by patient body habitus. Again identified is moderate right hemidiaphragm elevation. Midline trachea. Moderate cardiomegaly. Atherosclerosis in the transverse aorta. Trace left pleural fluid or thickening blunts the costophrenic angle on the lateral view. No pneumothorax. Low volumes on the frontal radiograph with resultant pulmonary interstitial prominence. No overt congestive failure. Right base volume loss with scar and/or atelectasis. IMPRESSION: Cardiomegaly with low lung volumes but no  congestive failure. Trace left pleural fluid or thickening. Electronically Signed   By: Jeronimo Greaves M.D.   On: 10/17/2022 15:11   DG Chest Portable 1 View  Result Date: 10/16/2022 CLINICAL DATA:  Acute onset shortness of breath EXAM: PORTABLE CHEST 1 VIEW COMPARISON:  Chest radiograph dated 09/13/2022 FINDINGS: Low lung volumes with unchanged asymmetric elevation of the right hemidiaphragm. Increased perihilar interstitial prominence. No pleural effusion or pneumothorax. Similar cardiomediastinal silhouette. The visualized skeletal structures are unremarkable. Aortic atherosclerosis. IMPRESSION: 1. Mild pulmonary edema. 2. Low lung volumes with unchanged asymmetric elevation of the right hemidiaphragm. 3. Aortic Atherosclerosis (ICD10-I70.0). Electronically Signed   By: Agustin Cree M.D.   On: 10/16/2022 08:11   DG Ribs Unilateral W/Chest Left  Result Date: 10/06/2022 CLINICAL DATA:  Fall EXAM: LEFT RIBS AND CHEST - 3+ VIEW COMPARISON:  None Available. FINDINGS: No fracture or other bone lesions are seen involving the ribs. There is no evidence of pneumothorax or pleural effusion. Both lungs are clear. Heart size and mediastinal contours are within normal limits. IMPRESSION: Negative. Electronically Signed   By: Deatra Lindquist M.D.   On: 10/06/2022 03:45   ECHOCARDIOGRAM COMPLETE  Result Date: 09/25/2022    ECHOCARDIOGRAM REPORT   Patient Name:   Timothy Gibson Date  of Exam: 09/25/2022 Medical Rec #:  [id number]        Height:       76.0 in Accession #:    [id number]       Weight:       254.9 lb Date of Birth:  04/17/1951         BSA:          2.456 m Patient Age:    71 years         BP:           136/37 mmHg Patient Gender: M                HR:           83 bpm. Exam Location:  [facility] Procedure: 2D Echo, Cardiac Doppler and Color Doppler Indications:     Chronic diastolic Heart Failure I50.32  History:         Patient has prior history of Echocardiogram examinations, most                  recent 03/22/2017.  Risk Factors:Hypertension and Diabetes. ESRD.  Sonographer:     Cristela BlueJerry Hege Referring Phys:  [id number] Martina SinnerJONATHAN B DEWALD Diagnosing Phys: Lorine BearsMuhammad Arida MD  Sonographer Comments: Suboptimal apical window. IMPRESSIONS  1. Left ventricular ejection fraction, by estimation, is 55 to 60%. The left ventricle has normal function. The left ventricle has no regional wall motion abnormalities. There is severe left ventricular hypertrophy. Left ventricular diastolic parameters  are consistent with Grade II diastolic dysfunction (pseudonormalization).  2. Right ventricular systolic function is normal. The right ventricular size is normal. There is normal pulmonary artery systolic pressure.  3. Left atrial size was mildly dilated.  4. The mitral valve is normal in structure. No evidence of mitral valve regurgitation. No evidence of mitral stenosis. Moderate mitral annular calcification.  5. The aortic valve is normal in structure. Aortic valve regurgitation is not visualized. Aortic valve sclerosis/calcification is present, without any evidence of aortic stenosis. FINDINGS  Left Ventricle: Left ventricular ejection fraction, by estimation, is 55 to 60%. The left ventricle has normal function. The left ventricle has no regional wall motion abnormalities. The left ventricular internal cavity size was normal in size. There is  severe left ventricular hypertrophy. Left ventricular diastolic parameters are consistent with Grade II diastolic dysfunction (pseudonormalization). Right Ventricle: The right ventricular size is normal. No increase in right ventricular wall thickness. Right ventricular systolic function is normal. There is normal pulmonary artery systolic pressure. The tricuspid regurgitant velocity is 2.66 m/s, and  with an assumed right atrial pressure of 5 mmHg, the estimated right ventricular systolic pressure is 33.3 mmHg. Left Atrium: Left atrial size was mildly dilated. Right Atrium: Right atrial size was normal in  size. Pericardium: There is no evidence of pericardial effusion. Mitral Valve: The mitral valve is normal in structure. Moderate mitral annular calcification. No evidence of mitral valve regurgitation. No evidence of mitral valve stenosis. MV peak gradient, 10.6 mmHg. The mean mitral valve gradient is 6.0 mmHg. Tricuspid Valve: The tricuspid valve is normal in structure. Tricuspid valve regurgitation is mild . No evidence of tricuspid stenosis. Aortic Valve: The aortic valve is normal in structure. Aortic valve regurgitation is not visualized. Aortic valve sclerosis/calcification is present, without any evidence of aortic stenosis. Aortic valve mean gradient measures 6.0 mmHg. Aortic valve peak  gradient measures 11.2 mmHg. Aortic valve area, by VTI measures 2.65 cm. Pulmonic Valve: The pulmonic valve was normal in  structure. Pulmonic valve regurgitation is mild. No evidence of pulmonic stenosis. Aorta: The aortic root is normal in size and structure. Venous: The inferior vena cava was not well visualized. IAS/Shunts: No atrial level shunt detected by color flow Doppler.  LEFT VENTRICLE PLAX 2D LVIDd:         5.10 cm   Diastology LVIDs:         3.50 cm   LV e' medial:    6.42 cm/s LV PW:         1.70 cm   LV E/e' medial:  22.9 LV IVS:        2.30 cm   LV e' lateral:   6.42 cm/s LVOT diam:     2.20 cm   LV E/e' lateral: 22.9 LV SV:         89 LV SV Index:   36 LVOT Area:     3.80 cm  RIGHT VENTRICLE RV Basal diam:  3.80 cm RV Mid diam:    4.20 cm RV S prime:     7.51 cm/s TAPSE (M-mode): 1.9 cm LEFT ATRIUM           Index        RIGHT ATRIUM           Index LA diam:      5.00 cm 2.04 cm/m   RA Area:     16.90 cm LA Vol (A4C): 38.9 ml 15.84 ml/m  RA Volume:   42.40 ml  17.27 ml/m  AORTIC VALVE AV Area (Vmax):    2.30 cm AV Area (Vmean):   2.26 cm AV Area (VTI):     2.65 cm AV Vmax:           167.00 cm/s AV Vmean:          109.000 cm/s AV VTI:            0.334 m AV Peak Grad:      11.2 mmHg AV Mean Grad:       6.0 mmHg LVOT Vmax:         101.00 cm/s LVOT Vmean:        64.900 cm/s LVOT VTI:          0.233 m LVOT/AV VTI ratio: 0.70  AORTA Ao Root diam: 4.40 cm MITRAL VALVE                TRICUSPID VALVE MV Area (PHT): 4.01 cm     TR Peak grad:   28.3 mmHg MV Area VTI:   1.87 cm     TR Vmax:        266.00 cm/s MV Peak grad:  10.6 mmHg MV Mean grad:  6.0 mmHg     SHUNTS MV Vmax:       1.63 m/s     Systemic VTI:  0.23 m MV Vmean:      112.0 cm/s   Systemic Diam: 2.20 cm MV Decel Time: 189 msec MV E velocity: 147.00 cm/s MV A velocity: 147.00 cm/s MV E/A ratio:  1.00 Lorine Bears MD Electronically signed by Lorine Bears MD Signature Date/Time: 09/25/2022/11:52:22 AM    Final     Microbiology: Results for orders placed or performed during the hospital encounter of 09/13/22  MRSA Next Gen by PCR, Nasal     Status: None   Collection Time: 09/13/22  4:28 PM   Specimen: Nasal Mucosa; Nasal Swab  Result Value Ref Range Status   MRSA by PCR Next Gen NOT  DETECTED NOT DETECTED Final    Comment: (NOTE) The GeneXpert MRSA Assay (FDA approved for NASAL specimens only), is one component of a comprehensive MRSA colonization surveillance program. It is not intended to diagnose MRSA infection nor to guide or monitor treatment for MRSA infections. Test performance is not FDA approved in patients less than 12 years old. Performed at [facility], [address] [place], [place] [address]     Labs: CBC: Recent Labs  Lab 10/16/22 [phone] 10/17/22 0525 10/18/22 0845  WBC 4.0 3.1* 3.4*  HGB 8.5* 7.8* 8.0*  HCT 27.7* 25.4* 25.3*  MCV 102.2* 100.8* 100.8*  PLT 142* 143* 143*   Basic Metabolic Panel: Recent Labs  Lab 10/16/22 0727 10/17/22 0525 10/18/22 0846  NA 143 140 138  K 4.2 4.3 4.2  CL 101 101 98  CO2 GLUCOSE 266* 290* 313*  BUN 70* 44* 38*  CREATININE 12.73* 8.76* 7.67*  CALCIUM 8.9 8.7* 9.0  PHOS  --   --  6.3*   Liver Function Tests: Recent Labs  Lab 10/16/22 0727  10/18/22 0846  AST 24  --   ALT 24  --   ALKPHOS 75  --   BILITOT 0.8  --   PROT 7.7  --   ALBUMIN 3.6 3.0*   CBG: Recent Labs  Lab 10/17/22 0739 10/17/22 1604 10/17/22 2106 10/18/22 0750 10/18/22 1327  GLUCAP 226* 208* 279* 323* 174*    Discharge time spent: greater than 30 minutes.  Signed: Lucile Shutters, MD [facility] 10/18/2022

## 2022-10-18 NOTE — Progress Notes (Signed)
BP retaken due to it being low at 1030 check. RN made aware

## 2022-10-18 NOTE — Progress Notes (Signed)
[facility]  ROUNDING NOTE   Subjective:   Timothy Gibson is a 72 year old male with past medical conditions including diabetes, hypertension, renal carcinoma, and end-stage renal disease on hemodialysis.  Patient presents to the emergency department with complaints of shortness of breath and elevated blood pressure.  Patient has been admitted under observation for Peripheral edema [R60.0] Fluid overload [E87.70] Hypervolemia, unspecified hypervolemia type [E87.70] Dyspnea, unspecified type [R06.00]  Patient is known to our practice and receives outpatient dialysis treatments at [facility] on a MWF schedule, supervised by Dr. Cherylann Ratel.    Patient seen and evaluated during dialysis   HEMODIALYSIS FLOWSHEET:  Blood Flow Rate (mL/min): 350 mL/min Arterial Pressure (mmHg): -160 mmHg Venous Pressure (mmHg): 170 mmHg TMP (mmHg): 23 mmHg Ultrafiltration Rate (mL/min): 971 mL/min Dialysate Flow Rate (mL/min): 300 ml/min  Tolerating treatment well in chair  Dialysis received yesterday, UF 3.5L removed.   Objective:  Vital signs in last 24 hours:  Temp:  [97.5 F (36.4 C)-98.7 F (37.1 C)] 98.2 F (36.8 C) (04/10 0825) Pulse Rate:  [72-94] 83 (04/10 1040) Resp:  [9-18] 11 (04/10 1040) BP: (85-171)/(49-122) 120/67 (04/10 1040) SpO2:  [91 %-100 %] 96 % (04/10 1040) Weight:  [110.1 kg-117.3 kg] 117.3 kg (04/10 0829)  Weight change: 8.415 kg Filed Weights   10/17/22 1300 10/18/22 0358 10/18/22 0829  Weight: 116.1 kg 110.1 kg 117.3 kg    Intake/Output: I/O last 3 completed shifts: In: 720 [P.O.:720] Out: 3500 [Other:3500]   Intake/Output this shift:  No intake/output data recorded.  Physical Exam: General: NAD  Head: Normocephalic, atraumatic. Moist oral mucosal membranes  Eyes: Anicteric  Lungs:  Diminished bases, room air  Heart: Regular rate and rhythm  Abdomen:  Soft, nontender  Extremities: 1+ peripheral edema.  Neurologic: Alert and oriented,  moving all four extremities  Skin: No lesions, fluid-filled blisters lower extremities  Access: Lt AVF    Basic Metabolic Panel: Recent Labs  Lab 10/16/22 0727 10/17/22 0525 10/18/22 0846  NA 143 140 138  K 4.2 4.3 4.2  CL 101 101 98  CO2 27 28 28   GLUCOSE 266* 290* 313*  BUN 70* 44* 38*  CREATININE 12.73* 8.76* 7.67*  CALCIUM 8.9 8.7* 9.0  PHOS  --   --  6.3*     Liver Function Tests: Recent Labs  Lab 10/16/22 0727 10/18/22 0846  AST 24  --   ALT 24  --   ALKPHOS 75  --   BILITOT 0.8  --   PROT 7.7  --   ALBUMIN 3.6 3.0*    No results for input(s): "LIPASE", "AMYLASE" in the last 168 hours. No results for input(s): "AMMONIA" in the last 168 hours.  CBC: Recent Labs  Lab 10/16/22 0727 10/17/22 0525 10/18/22 0845  WBC 4.0 3.1* 3.4*  HGB 8.5* 7.8* 8.0*  HCT 27.7* 25.4* 25.3*  MCV 102.2* 100.8* 100.8*  PLT 142* 143* 143*     Cardiac Enzymes: No results for input(s): "CKTOTAL", "CKMB", "CKMBINDEX", "TROPONINI" in the last 168 hours.  BNP: Invalid input(s): "POCBNP"  CBG: Recent Labs  Lab 10/16/22 2149 10/17/22 0739 10/17/22 1604 10/17/22 2106 10/18/22 0750  GLUCAP 164* 226* 208* 279* 323*     Microbiology: Results for orders placed or performed during the hospital encounter of 09/13/22  MRSA Next Gen by PCR, Nasal     Status: None   Collection Time: 09/13/22  4:28 PM   Specimen: Nasal Mucosa; Nasal Swab  Result Value Ref Range Status  MRSA by PCR Next Gen NOT DETECTED NOT DETECTED Final    Comment: (NOTE) The GeneXpert MRSA Assay (FDA approved for NASAL specimens only), is one component of a comprehensive MRSA colonization surveillance program. It is not intended to diagnose MRSA infection nor to guide or monitor treatment for MRSA infections. Test performance is not FDA approved in patients less than 72 years old. Performed at [facility], [address] [place], [place] [address]     Coagulation Studies: No results  for input(s): "LABPROT", "INR" in the last 72 hours.  Urinalysis: No results for input(s): "COLORURINE", "LABSPEC", "PHURINE", "GLUCOSEU", "HGBUR", "BILIRUBINUR", "KETONESUR", "PROTEINUR", "UROBILINOGEN", "NITRITE", "LEUKOCYTESUR" in the last 72 hours.  Invalid input(s): "APPERANCEUR"    Imaging: DG Chest 2 View  Result Date: 10/17/2022 CLINICAL DATA:  Shortness of breath EXAM: CHEST - 2 VIEW COMPARISON:  Yesterday FINDINGS: Both views are degraded by patient body habitus. Again identified is moderate right hemidiaphragm elevation. Midline trachea. Moderate cardiomegaly. Atherosclerosis in the transverse aorta. Trace left pleural fluid or thickening blunts the costophrenic angle on the lateral view. No pneumothorax. Low volumes on the frontal radiograph with resultant pulmonary interstitial prominence. No overt congestive failure. Right base volume loss with scar and/or atelectasis. IMPRESSION: Cardiomegaly with low lung volumes but no congestive failure. Trace left pleural fluid or thickening. Electronically Signed   By: Jeronimo GreavesKyle  Talbot M.D.   On: 10/17/2022 15:11     Medications:    anticoagulant sodium citrate      amLODipine  5 mg Oral Daily   aspirin EC  81 mg Oral Daily   busPIRone  10 mg Oral BID   Chlorhexidine Gluconate Cloth  6 each Topical Q0600   fluticasone furoate-vilanterol  1 puff Inhalation Daily   And   umeclidinium bromide  1 puff Inhalation Daily   heparin  5,000 Units Subcutaneous Q8H   insulin aspart  0-5 Units Subcutaneous QHS   insulin aspart  0-9 Units Subcutaneous TID WC   insulin glargine-yfgn  15 Units Subcutaneous QHS   metoprolol succinate  25 mg Oral Daily   sevelamer carbonate  2,400 mg Oral TID WC   traZODone  50 mg Oral QHS   acetaminophen, albuterol, alteplase, anticoagulant sodium citrate, dextromethorphan-guaiFENesin, diphenhydrAMINE, heparin, hydrALAZINE, lidocaine (PF), lidocaine-prilocaine, pentafluoroprop-tetrafluoroeth  Assessment/ Plan:  Timothy Gibson is a 72 y.o.  male  with past medical conditions including diabetes, hypertension, renal carcinoma, and end-stage renal disease on hemodialysis.  Patient presents to the emergency department with complaints of shortness of breath and elevated blood pressure.  Patient has been admitted under observation for Peripheral edema [R60.0] Fluid overload [E87.70] Hypervolemia, unspecified hypervolemia type [E87.70] Dyspnea, unspecified type [R06.00]   [facility]/MWF/left aVF  Acute respiratory failure with end-stage renal disease on hemodialysis.  No recent missed dialysis treatments.  Chest x-ray shows mild pulmonary edema.   Received dialysis yesterday, UF 3.5L achieved. Will received scheduled dialysis today, UF goal 2.5-3L as tolerated. Next treatment scheduled for Friday.  2. Anemia of chronic kidney disease Lab Results  Component Value Date   HGB 8.0 (L) 10/18/2022    Patient receives Mircera at outpatient clinic.  Hgb remains decreased but slowly improving.  3. Secondary Hyperparathyroidism: with outpatient labs:None Lab Results  Component Value Date   CALCIUM 9.0 10/18/2022   PHOS 6.3 (H) 10/18/2022    [facility]. Calcium acceptable, phosphorus elevated. Continue Sevelamer.   4.  Hypertension with chronic kidney disease.  Home regimen  includes metoprolol and Entresto.  Has not taken his medications this morning.  These medications remain held.  Blood pressure stable during dialysis   LOS: 1 Aveena Bari 4/10/202410:44 AM

## 2022-10-18 NOTE — Progress Notes (Signed)
PT Cancellation Note  Patient Details Name: Timothy Gibson MRN: [id number] DOB: Jul 03, 1951   Cancelled Treatment:    Reason Eval/Treat Not Completed: Other (comment) (Pt OTF for HD earlier in day at first attempt. Pt now in process of DC per RN, no need for PT evaluation at this time.)   2:24 PM, 10/18/22 Rosamaria Lints, PT, DPT Physical Therapist - [facility]  [phone] (ASCOM)    [name] 10/18/2022, 2:24 PM

## 2022-10-18 NOTE — Inpatient Diabetes Management (Signed)
Inpatient Diabetes Program Recommendations  AACE/ADA: New Consensus Statement on Inpatient Glycemic Control   Target Ranges:  Prepandial:   less than 140 mg/dL      Peak postprandial:   less than 180 mg/dL (1-2 hours)      Critically ill patients:  140 - 180 mg/dL    Latest Reference Range & Units 10/17/22 07:39 10/17/22 16:04 10/17/22 21:06 10/18/22 07:50  Glucose-Capillary 70 - 99 mg/dL 357 (H) 017 (H) 793 (H) 323 (H)    Latest Reference Range & Units 10/16/22 21:52  Hemoglobin A1C 4.8 - 5.6 % 7.4 (H)   Review of Glycemic Control  Diabetes history: DM2 Outpatient Diabetes medications: Basaglar 20 units QHS Current orders for Inpatient glycemic control: Semglee 15 units QHS, Novolog 0-9 units TID with meals, Novolog 0-5 units QHS  Inpatient Diabetes Program Recommendations:    Insulin: Please consider increasing Semglee to 20 units QHS and adding Novolog 3 units TID with meals for meal coverage if patient eats at least 50% of meals.  Thanks, Orlando Penner, RN, MSN, CDCES Diabetes Coordinator Inpatient Diabetes Program [phone] (Team Pager from 8am to 5pm)

## 2022-10-18 NOTE — Progress Notes (Signed)
  Received patient in bed to unit.   Informed consent signed and in chart.    TX duration: 3.5hrs     Transported back to floor Hand-off given to patient's nurse. Pt still c/o H/A   Access used: LAVF Access issues: none   Total UF removed: 2.5kg Medication(s) given: tylenol 650mg  Post HD VS: stable  Post HD weight: 115.1kg     Lynann Beaver  [facility]

## 2022-10-19 ENCOUNTER — Telehealth: Payer: Self-pay | Admitting: *Deleted

## 2022-10-19 NOTE — Transitions of Care (Post Inpatient/ED Visit) (Signed)
   10/19/2022  Name: KEYMON SANDAHL MRN: [id number] DOB: 1951/05/07  Today's TOC FU Call Status: Today's TOC FU Call Status:: Unsuccessul Call (1st Attempt) Unsuccessful Call (1st Attempt) Date: 10/19/22  Attempted to reach the patient regarding the most recent Inpatient/ED visit.  Follow Up Plan: Additional outreach attempts will be made to reach the patient to complete the Transitions of Care (Post Inpatient/ED visit) call.   Gean Maidens BSN RN [facility] [phone]

## 2022-10-20 ENCOUNTER — Telehealth: Payer: Self-pay

## 2022-10-20 DIAGNOSIS — D631 Anemia in chronic kidney disease: Secondary | ICD-10-CM | POA: Diagnosis not present

## 2022-10-20 DIAGNOSIS — Z992 Dependence on renal dialysis: Secondary | ICD-10-CM | POA: Diagnosis not present

## 2022-10-20 DIAGNOSIS — N2581 Secondary hyperparathyroidism of renal origin: Secondary | ICD-10-CM | POA: Diagnosis not present

## 2022-10-20 DIAGNOSIS — E8779 Other fluid overload: Secondary | ICD-10-CM | POA: Diagnosis not present

## 2022-10-20 DIAGNOSIS — N186 End stage renal disease: Secondary | ICD-10-CM | POA: Diagnosis not present

## 2022-10-20 DIAGNOSIS — D509 Iron deficiency anemia, unspecified: Secondary | ICD-10-CM | POA: Diagnosis not present

## 2022-10-20 NOTE — Transitions of Care (Post Inpatient/ED Visit) (Signed)
   10/20/2022  Name: Timothy Gibson MRN: [id number] DOB: Jun 03, 1951  Today's TOC FU Call Status: Today's TOC FU Call Status:: Successful TOC FU Call Competed TOC FU Call Complete Date: 10/20/22  Transition Care Management Follow-up Telephone Call Date of Discharge: 10/18/22 Discharge Facility: [facility]) Type of Discharge: Inpatient Admission Primary Inpatient Discharge Diagnosis:: "hypervolemia,unspecified" How have you been since you were released from the hospital?:  (Brief call wit pt-staed he hd been doing fine sincedsicharge home. however, he went ot regular HD tx this mroning-since hen he has not 'felt good". Denies any pain or specific sxs-states sometimes after txs "just doesn't feel good-lying down resting now") Any questions or concerns?: No  Items Reviewed: Did you receive and understand the discharge instructions provided?: Yes Medications obtained and verified?: No (patient did not feel up to med review-confirms he has all his meds and understands how to take them) Any new allergies since your discharge?: No Dietary orders reviewed?: Yes Type of Diet Ordered:: carb mod/renal Do you have support at home?: No  Home Care and Equipment/Supplies: Were Home Health Services Ordered?: NA Any new equipment or medical supplies ordered?: NA  Functional Questionnaire: Do you need assistance with bathing/showering or dressing?: No Do you need assistance with meal preparation?: No Do you need assistance with eating?: No Do you have difficulty maintaining continence: No Do you need assistance with getting out of bed/getting out of a chair/moving?: No Do you have difficulty managing or taking your medications?: No  Follow up appointments reviewed: PCP Follow-up appointment confirmed?: No (pt encouraged to call and make follow up appt) MD Provider Line Number:[phone] Given: No Specialist Hospital Follow-up appointment confirmed?: Yes Date of  Specialist follow-up appointment?: 10/31/22 Follow-Up Specialty Provider:: Dr. Aundria Rud Do you need transportation to your follow-up appointment?: No Do you understand care options if your condition(s) worsen?: Yes-patient verbalized understanding   TOC Interventions Today    Flowsheet Row Most Recent Value  TOC Interventions   TOC Interventions Discussed/Reviewed TOC Interventions Discussed      Interventions Today    Flowsheet Row Most Recent Value  General Interventions   General Interventions Discussed/Reviewed General Interventions Discussed, Doctor Visits  Doctor Visits Discussed/Reviewed Doctor Visits Discussed, PCP, Specialist  PCP/Specialist Visits Compliance with follow-up visit  Education Interventions   Education Provided Provided Education  Provided Verbal Education On Nutrition, Medication, When to see the doctor, Other  [sx mgmt]  Nutrition Interventions   Nutrition Discussed/Reviewed Nutrition Discussed, Adding fruits and vegetables, Decreasing salt, Decreasing sugar intake  Pharmacy Interventions   Pharmacy Dicussed/Reviewed Pharmacy Topics Discussed, Medications and their functions       Alessandra Grout [facility] Care Management Care Management Community Coordinator Direct Phone: [phone] Toll Free: [phone] Fax: [phone]

## 2022-10-23 DIAGNOSIS — N2581 Secondary hyperparathyroidism of renal origin: Secondary | ICD-10-CM | POA: Diagnosis not present

## 2022-10-23 DIAGNOSIS — Z992 Dependence on renal dialysis: Secondary | ICD-10-CM | POA: Diagnosis not present

## 2022-10-23 DIAGNOSIS — D509 Iron deficiency anemia, unspecified: Secondary | ICD-10-CM | POA: Diagnosis not present

## 2022-10-23 DIAGNOSIS — E8779 Other fluid overload: Secondary | ICD-10-CM | POA: Diagnosis not present

## 2022-10-23 DIAGNOSIS — N186 End stage renal disease: Secondary | ICD-10-CM | POA: Diagnosis not present

## 2022-10-23 DIAGNOSIS — D631 Anemia in chronic kidney disease: Secondary | ICD-10-CM | POA: Diagnosis not present

## 2022-10-25 DIAGNOSIS — E8779 Other fluid overload: Secondary | ICD-10-CM | POA: Diagnosis not present

## 2022-10-25 DIAGNOSIS — D509 Iron deficiency anemia, unspecified: Secondary | ICD-10-CM | POA: Diagnosis not present

## 2022-10-25 DIAGNOSIS — E119 Type 2 diabetes mellitus without complications: Secondary | ICD-10-CM | POA: Diagnosis not present

## 2022-10-25 DIAGNOSIS — D631 Anemia in chronic kidney disease: Secondary | ICD-10-CM | POA: Diagnosis not present

## 2022-10-25 DIAGNOSIS — Z992 Dependence on renal dialysis: Secondary | ICD-10-CM | POA: Diagnosis not present

## 2022-10-25 DIAGNOSIS — N2581 Secondary hyperparathyroidism of renal origin: Secondary | ICD-10-CM | POA: Diagnosis not present

## 2022-10-25 DIAGNOSIS — N186 End stage renal disease: Secondary | ICD-10-CM | POA: Diagnosis not present

## 2022-10-26 ENCOUNTER — Other Ambulatory Visit: Payer: Self-pay

## 2022-10-26 ENCOUNTER — Inpatient Hospital Stay
Admission: EM | Admit: 2022-10-26 | Discharge: 2022-10-27 | DRG: 871 | Disposition: A | Discharge status: Hospice | Payer: Medicare Other | Attending: Internal Medicine | Admitting: Internal Medicine

## 2022-10-26 ENCOUNTER — Emergency Department: Payer: Medicare Other

## 2022-10-26 ENCOUNTER — Encounter: Payer: Self-pay | Admitting: Emergency Medicine

## 2022-10-26 DIAGNOSIS — Z91158 Patient's noncompliance with renal dialysis for other reason: Secondary | ICD-10-CM

## 2022-10-26 DIAGNOSIS — N390 Urinary tract infection, site not specified: Secondary | ICD-10-CM | POA: Diagnosis not present

## 2022-10-26 DIAGNOSIS — N400 Enlarged prostate without lower urinary tract symptoms: Secondary | ICD-10-CM | POA: Diagnosis present

## 2022-10-26 DIAGNOSIS — Z794 Long term (current) use of insulin: Secondary | ICD-10-CM

## 2022-10-26 DIAGNOSIS — Z992 Dependence on renal dialysis: Secondary | ICD-10-CM

## 2022-10-26 DIAGNOSIS — L03115 Cellulitis of right lower limb: Secondary | ICD-10-CM | POA: Diagnosis present

## 2022-10-26 DIAGNOSIS — B9561 Methicillin susceptible Staphylococcus aureus infection as the cause of diseases classified elsewhere: Secondary | ICD-10-CM | POA: Diagnosis not present

## 2022-10-26 DIAGNOSIS — A419 Sepsis, unspecified organism: Principal | ICD-10-CM | POA: Diagnosis present

## 2022-10-26 DIAGNOSIS — E1169 Type 2 diabetes mellitus with other specified complication: Secondary | ICD-10-CM | POA: Diagnosis present

## 2022-10-26 DIAGNOSIS — M869 Osteomyelitis, unspecified: Secondary | ICD-10-CM | POA: Diagnosis not present

## 2022-10-26 DIAGNOSIS — E785 Hyperlipidemia, unspecified: Secondary | ICD-10-CM | POA: Diagnosis present

## 2022-10-26 DIAGNOSIS — M86172 Other acute osteomyelitis, left ankle and foot: Secondary | ICD-10-CM | POA: Diagnosis not present

## 2022-10-26 DIAGNOSIS — R739 Hyperglycemia, unspecified: Secondary | ICD-10-CM | POA: Diagnosis not present

## 2022-10-26 DIAGNOSIS — R197 Diarrhea, unspecified: Secondary | ICD-10-CM | POA: Diagnosis present

## 2022-10-26 DIAGNOSIS — L089 Local infection of the skin and subcutaneous tissue, unspecified: Secondary | ICD-10-CM | POA: Diagnosis not present

## 2022-10-26 DIAGNOSIS — D630 Anemia in neoplastic disease: Secondary | ICD-10-CM | POA: Diagnosis not present

## 2022-10-26 DIAGNOSIS — Z91048 Other nonmedicinal substance allergy status: Secondary | ICD-10-CM

## 2022-10-26 DIAGNOSIS — D638 Anemia in other chronic diseases classified elsewhere: Secondary | ICD-10-CM | POA: Diagnosis present

## 2022-10-26 DIAGNOSIS — C641 Malignant neoplasm of right kidney, except renal pelvis: Secondary | ICD-10-CM | POA: Diagnosis not present

## 2022-10-26 DIAGNOSIS — Z1152 Encounter for screening for COVID-19: Secondary | ICD-10-CM | POA: Diagnosis not present

## 2022-10-26 DIAGNOSIS — Z5320 Procedure and treatment not carried out because of patient's decision for unspecified reasons: Secondary | ICD-10-CM | POA: Diagnosis present

## 2022-10-26 DIAGNOSIS — E1151 Type 2 diabetes mellitus with diabetic peripheral angiopathy without gangrene: Secondary | ICD-10-CM | POA: Diagnosis present

## 2022-10-26 DIAGNOSIS — L97529 Non-pressure chronic ulcer of other part of left foot with unspecified severity: Secondary | ICD-10-CM | POA: Diagnosis present

## 2022-10-26 DIAGNOSIS — R000 Tachycardia, unspecified: Secondary | ICD-10-CM | POA: Diagnosis not present

## 2022-10-26 DIAGNOSIS — R1110 Vomiting, unspecified: Secondary | ICD-10-CM | POA: Diagnosis present

## 2022-10-26 DIAGNOSIS — R509 Fever, unspecified: Secondary | ICD-10-CM | POA: Diagnosis not present

## 2022-10-26 DIAGNOSIS — E118 Type 2 diabetes mellitus with unspecified complications: Secondary | ICD-10-CM | POA: Diagnosis not present

## 2022-10-26 DIAGNOSIS — J9811 Atelectasis: Secondary | ICD-10-CM | POA: Diagnosis not present

## 2022-10-26 DIAGNOSIS — N186 End stage renal disease: Secondary | ICD-10-CM | POA: Diagnosis present

## 2022-10-26 DIAGNOSIS — Z79899 Other long term (current) drug therapy: Secondary | ICD-10-CM

## 2022-10-26 DIAGNOSIS — L0390 Cellulitis, unspecified: Secondary | ICD-10-CM | POA: Diagnosis not present

## 2022-10-26 DIAGNOSIS — I120 Hypertensive chronic kidney disease with stage 5 chronic kidney disease or end stage renal disease: Secondary | ICD-10-CM | POA: Diagnosis present

## 2022-10-26 DIAGNOSIS — E1110 Type 2 diabetes mellitus with ketoacidosis without coma: Secondary | ICD-10-CM | POA: Insufficient documentation

## 2022-10-26 DIAGNOSIS — Z7401 Bed confinement status: Secondary | ICD-10-CM | POA: Diagnosis not present

## 2022-10-26 DIAGNOSIS — R0602 Shortness of breath: Secondary | ICD-10-CM | POA: Diagnosis not present

## 2022-10-26 DIAGNOSIS — I10 Essential (primary) hypertension: Secondary | ICD-10-CM | POA: Diagnosis present

## 2022-10-26 DIAGNOSIS — R0689 Other abnormalities of breathing: Secondary | ICD-10-CM | POA: Diagnosis not present

## 2022-10-26 DIAGNOSIS — F32A Depression, unspecified: Secondary | ICD-10-CM | POA: Diagnosis not present

## 2022-10-26 DIAGNOSIS — I959 Hypotension, unspecified: Secondary | ICD-10-CM | POA: Diagnosis not present

## 2022-10-26 DIAGNOSIS — F4321 Adjustment disorder with depressed mood: Secondary | ICD-10-CM | POA: Diagnosis present

## 2022-10-26 DIAGNOSIS — L03116 Cellulitis of left lower limb: Secondary | ICD-10-CM | POA: Diagnosis not present

## 2022-10-26 DIAGNOSIS — Z66 Do not resuscitate: Secondary | ICD-10-CM | POA: Diagnosis present

## 2022-10-26 DIAGNOSIS — M868X7 Other osteomyelitis, ankle and foot: Secondary | ICD-10-CM | POA: Diagnosis not present

## 2022-10-26 DIAGNOSIS — E1122 Type 2 diabetes mellitus with diabetic chronic kidney disease: Secondary | ICD-10-CM | POA: Diagnosis present

## 2022-10-26 DIAGNOSIS — R531 Weakness: Secondary | ICD-10-CM | POA: Diagnosis not present

## 2022-10-26 DIAGNOSIS — Z515 Encounter for palliative care: Secondary | ICD-10-CM | POA: Diagnosis not present

## 2022-10-26 DIAGNOSIS — R0902 Hypoxemia: Secondary | ICD-10-CM | POA: Diagnosis not present

## 2022-10-26 HISTORY — DX: Disorder of kidney and ureter, unspecified: N28.9

## 2022-10-26 LAB — CBC WITH DIFFERENTIAL/PLATELET
Abs Immature Granulocytes: 0.03 10*3/uL (ref 0.00–0.07)
Basophils Absolute: 0 10*3/uL (ref 0.0–0.1)
Basophils Relative: 0 %
Eosinophils Absolute: 0 10*3/uL (ref 0.0–0.5)
Eosinophils Relative: 0 %
HCT: 28.4 % — ABNORMAL LOW (ref 39.0–52.0)
Hemoglobin: 8.7 g/dL — ABNORMAL LOW (ref 13.0–17.0)
Immature Granulocytes: 0 %
Lymphocytes Relative: 5 %
Lymphs Abs: 0.3 10*3/uL — ABNORMAL LOW (ref 0.7–4.0)
MCH: 30.7 pg (ref 26.0–34.0)
MCHC: 30.6 g/dL (ref 30.0–36.0)
MCV: 100.4 fL — ABNORMAL HIGH (ref 80.0–100.0)
Monocytes Absolute: 0.4 10*3/uL (ref 0.1–1.0)
Monocytes Relative: 6 %
Neutro Abs: 6.1 10*3/uL (ref 1.7–7.7)
Neutrophils Relative %: 89 %
Platelets: 164 10*3/uL (ref 150–400)
RBC: 2.83 MIL/uL — ABNORMAL LOW (ref 4.22–5.81)
RDW: 13.6 % (ref 11.5–15.5)
WBC: 6.9 10*3/uL (ref 4.0–10.5)
nRBC: 0 % (ref 0.0–0.2)

## 2022-10-26 LAB — COMPREHENSIVE METABOLIC PANEL
ALT: 13 U/L (ref 0–44)
AST: 18 U/L (ref 15–41)
Albumin: 3.1 g/dL — ABNORMAL LOW (ref 3.5–5.0)
Alkaline Phosphatase: 73 U/L (ref 38–126)
Anion gap: 18 — ABNORMAL HIGH (ref 5–15)
BUN: 58 mg/dL — ABNORMAL HIGH (ref 8–23)
CO2: 25 mmol/L (ref 22–32)
Calcium: 9.6 mg/dL (ref 8.9–10.3)
Chloride: 96 mmol/L — ABNORMAL LOW (ref 98–111)
Creatinine, Ser: 10.18 mg/dL — ABNORMAL HIGH (ref 0.61–1.24)
GFR, Estimated: 5 mL/min — ABNORMAL LOW (ref >60)
Glucose, Bld: 318 mg/dL — ABNORMAL HIGH (ref 70–99)
Potassium: 4.1 mmol/L (ref 3.5–5.1)
Sodium: 139 mmol/L (ref 135–145)
Total Bilirubin: 1 mg/dL (ref 0.3–1.2)
Total Protein: 7.7 g/dL (ref 6.5–8.1)

## 2022-10-26 LAB — LACTIC ACID, PLASMA
Lactic Acid, Venous: 2.3 mmol/L (ref 0.5–1.9)
Lactic Acid, Venous: 1.9 mmol/L (ref 0.5–1.9)

## 2022-10-26 LAB — SARS CORONAVIRUS 2 BY RT PCR: SARS Coronavirus 2 by RT PCR: NEGATIVE

## 2022-10-26 MED ORDER — SODIUM CHLORIDE 0.9 % IV SOLN
2.0000 g | Freq: Once | INTRAVENOUS | Status: AC
  Administered 2022-10-26: 2 g via INTRAVENOUS
  Filled 2022-10-26: qty 12.5

## 2022-10-26 MED ORDER — VANCOMYCIN HCL 1500 MG/300ML IV SOLN
1500.0000 mg | Freq: Once | INTRAVENOUS | Status: AC
  Administered 2022-10-26: 1500 mg via INTRAVENOUS
  Filled 2022-10-26: qty 300

## 2022-10-26 MED ORDER — INSULIN ASPART 100 UNIT/ML IJ SOLN
0.0000 [IU] | Freq: Three times a day (TID) | INTRAMUSCULAR | Status: DC
  Administered 2022-10-27 (×2): 1 [IU] via SUBCUTANEOUS
  Filled 2022-10-26: qty 1

## 2022-10-26 MED ORDER — LACTATED RINGERS IV SOLN: INTRAVENOUS | Status: AC

## 2022-10-26 MED ORDER — VANCOMYCIN HCL IN DEXTROSE 1-5 GM/200ML-% IV SOLN: 1000.0000 mg | Freq: Once | INTRAVENOUS | Status: DC

## 2022-10-26 MED ORDER — HEPARIN SODIUM (PORCINE) 5000 UNIT/ML IJ SOLN
5000.0000 [IU] | Freq: Three times a day (TID) | INTRAMUSCULAR | Status: DC
  Administered 2022-10-27: 5000 [IU] via SUBCUTANEOUS
  Filled 2022-10-26: qty 1

## 2022-10-26 MED ORDER — VANCOMYCIN HCL IN DEXTROSE 1-5 GM/200ML-% IV SOLN
1000.0000 mg | Freq: Once | INTRAVENOUS | Status: AC
  Administered 2022-10-26: 1000 mg via INTRAVENOUS

## 2022-10-26 MED ORDER — METRONIDAZOLE 500 MG/100ML IV SOLN
500.0000 mg | Freq: Once | INTRAVENOUS | Status: AC
  Administered 2022-10-26: 500 mg via INTRAVENOUS
  Filled 2022-10-26: qty 100

## 2022-10-26 MED ORDER — ONDANSETRON HCL 4 MG/2ML IJ SOLN: 4.0000 mg | Freq: Four times a day (QID) | INTRAMUSCULAR | Status: DC | PRN

## 2022-10-26 MED ORDER — ACETAMINOPHEN 500 MG PO TABS
1000.0000 mg | ORAL_TABLET | Freq: Once | ORAL | Status: AC
  Administered 2022-10-26: 1000 mg via ORAL
  Filled 2022-10-26: qty 2

## 2022-10-26 MED ORDER — INSULIN ASPART 100 UNIT/ML IJ SOLN
0.0000 [IU] | Freq: Every day | INTRAMUSCULAR | Status: DC
  Administered 2022-10-27: 4 [IU] via SUBCUTANEOUS
  Filled 2022-10-26: qty 1

## 2022-10-26 MED ORDER — LACTATED RINGERS IV BOLUS
500.0000 mL | Freq: Once | INTRAVENOUS | Status: AC
  Administered 2022-10-26: 500 mL via INTRAVENOUS

## 2022-10-26 MED ORDER — ONDANSETRON HCL 4 MG PO TABS: 4.0000 mg | ORAL_TABLET | Freq: Four times a day (QID) | ORAL | Status: DC | PRN

## 2022-10-26 MED ORDER — OXYCODONE HCL 5 MG PO TABS
5.0000 mg | ORAL_TABLET | ORAL | Status: DC | PRN
  Administered 2022-10-27: 5 mg via ORAL
  Filled 2022-10-26: qty 1

## 2022-10-26 MED ORDER — VANCOMYCIN HCL IN DEXTROSE 1-5 GM/200ML-% IV SOLN
1000.0000 mg | Freq: Once | INTRAVENOUS | Status: DC
  Filled 2022-10-26: qty 200

## 2022-10-26 NOTE — Sepsis Progress Note (Signed)
Elink monitoring for the code sepsis protocol.  

## 2022-10-26 NOTE — Consult Note (Signed)
PHARMACY -  BRIEF ANTIBIOTIC NOTE   Pharmacy has received consult(s) for vancomycin and cefepime from an ED provider.  The patient's profile has been reviewed for ht/wt/allergies/indication/available labs.    One time order(s) placed for cefepime 2g IV x1, Vancomycin  IV x 1  Further antibiotics/pharmacy consults should be ordered by admitting physician if indicated.                       Thank you, Selinda Eon 10/26/2022  9:13 PM

## 2022-10-26 NOTE — ED Provider Notes (Addendum)
[facility] Provider Note    Event Date/Time   First MD Initiated Contact with Patient 10/26/22 2006     (approximate)   History   Fever and Wound Infection   HPI  Timothy Gibson is a 72 y.o. male  ESRD-HD (MWF), dCHF, HTN, HLD, DM, PVD, BPH, anemia, right kidney cancer who presents with fever and chills.  Patient's friend who lives with him and is also his caretaker tells me that he started having the shakes yesterday.  Today had diarrhea and episode of emesis and was more confused.  He recently decided that he did not want to continue with dialysis anymore.  Was last dialyzed yesterday.  He is in the process of being transitioned to hospice.  Patient tells me that he wants to go home.  He is not endorsing any specific complaints.  He does not want antibiotics or additional care.  He understands that he has a life-threatening infection and that he will likely die of his untreated he says he is ready to die.     Past Medical History:  Diagnosis Date   Renal disorder     There are no problems to display for this patient.    Physical Exam  Triage Vital Signs: ED Triage Vitals  Enc Vitals Group     BP 10/26/22 2010 (!) 190/118     Pulse Rate 10/26/22 2010 (!) 130     Resp 10/26/22 2010 (!) 26     Temp 10/26/22 2010 99.8 F (37.7 C)     Temp Source 10/26/22 2010 Oral     SpO2 10/26/22 2006 96 %     Weight --      Height 10/26/22 2010 6' 4.5" (1.943 m)     Head Circumference --      Peak Flow --      Pain Score 10/26/22 2010 0     Pain Loc --      Pain Edu? --      Excl. in GC? --     Most recent vital signs: Vitals:   10/26/22 2031 10/26/22 2130  BP:  (!) 172/57  Pulse:  (!) 113  Resp:  (!) 29  Temp:    SpO2: 98% 100%     General: Awake, patient with rigors ill-appearing CV:  Good peripheral perfusion.  Pitting edema bilateral lower extremities with chronic venous stasis changes, left lower extremity is more swollen and erythematous  and there is a wound on the lateral part of the foot Resp:  Normal effort.  Patient is tachypneic Abd:  No distention.  Soft and nontender throughout Neuro:             Awake, Alert, Oriented x 2-  Other:     ED Rsults / Procedures / Treatments  Labs (all labs ordered are listed, but only abnormal results are displayed) Labs Reviewed  COMPREHENSIVE METABOLIC PANEL - Abnormal; Notable for the following components:      Result Value   Chloride 96 (*)    Glucose, Bld 318 (*)    BUN 58 (*)    Creatinine, Ser 10.18 (*)    Albumin 3.1 (*)    GFR, Estimated 5 (*)    Anion gap 18 (*)    All other components within normal limits  CBC WITH DIFFERENTIAL/PLATELET - Abnormal; Notable for the following components:   RBC 2.83 (*)    Hemoglobin 8.7 (*)    HCT 28.4 (*)    MCV 100.4 (*)  Lymphs Abs 0.3 (*)    All other components within normal limits  LACTIC ACID, PLASMA - Abnormal; Notable for the following components:   Lactic Acid, Venous 2.3 (*)    All other components within normal limits  SARS CORONAVIRUS 2 BY RT PCR  CULTURE, BLOOD (ROUTINE X 2)  CULTURE, BLOOD (ROUTINE X 2)  LACTIC ACID, PLASMA     EKG  EKG reviewed and interpreted by myself shows a narrow complex regular tachycardia with some visible P waves without clear ischemic change incomplete right bundle branch block prolonged QT interval   RADIOLOGY    PROCEDURES:  Critical Care performed: Yes, see critical care procedure note(s)  .Critical Care  Performed by: Georga Hacking, MD Authorized by: Georga Hacking, MD   Critical care provider statement:    Critical care time (minutes):  30   Critical care was time spent personally by me on the following activities:  Development of treatment plan with patient or surrogate, discussions with consultants, evaluation of patient's response to treatment, examination of patient, ordering and review of laboratory studies, ordering and review of radiographic studies,  ordering and performing treatments and interventions, pulse oximetry, re-evaluation of patient's condition and review of old charts   The patient is on the cardiac monitor to evaluate for evidence of arrhythmia and/or significant heart rate changes.   MEDICATIONS ORDERED IN ED: Medications  lactated ringers infusion ( Intravenous New Bag/Given 10/26/22 2221)  vancomycin (VANCOCIN) IVPB 1000 mg/200 mL premix (0 mg Intravenous Stopped 10/26/22 2248)    Followed by  vancomycin (VANCOREADY) IVPB 1500 mg/300 mL (has no administration in time range)  acetaminophen (TYLENOL) tablet 1,000 mg (1,000 mg Oral Given 10/26/22 2049)  ceFEPIme (MAXIPIME) 2 g in sodium chloride 0.9 % 100 mL IVPB (0 g Intravenous Stopped 10/26/22 2145)  metroNIDAZOLE (FLAGYL) IVPB 500 mg (0 mg Intravenous Stopped 10/26/22 2246)  lactated ringers bolus 500 mL (0 mLs Intravenous Stopped 10/26/22 2219)     IMPRESSION / MDM / ASSESSMENT AND PLAN / ED COURSE  I reviewed the triage vital signs and the nursing notes.                              Patient's presentation is most consistent with acute presentation with potential threat to life or bodily function.  Differential diagnosis includes, but is not limited to, sepsis secondary to wound infection, bacteremia, pneumonia, UTI, intra-abdominal infection  Patient is a 72 year old male with history of end-stage renal disease who presents with fever and chills.  Patient's caregiver tells me that he started having the shakes yesterday and then became confused today.  She did notice a blister on the left foot.  He had diarrhea and vomited as well.  Per EMS he is febrile tachycardic tachypneic meeting sepsis criteria.  Initial EKG shows A-fib with rates in the 130s.  This is new for him.  On arrival patient is Reiger ring and looks unwell.  He is alert and oriented x 2.  He tells me he does not want to be here.  He is not really able to understand or verbalize the understanding of  risks of leaving.  On exam he does have a wound on the left lateral part of the foot with erythema tracking up the calf.  Abdomen is soft neck is supple he is diaphoretic.   Patient's caregiver who is his friend is at bedside tells me that he has decided he  does not want to undergo dialysis anymore and wants to be hospice.  Has been asking her to give him something to die.  She is hiding his guns from him and is concerned about his potential of harming himself.  Apparently patient did sign a DNR form.  Currently he is agreeable/does not have capacity to decide to leave the hospital was not in any state 2.  Will give a small fluid bolus and treat with broad-spectrum IV antibiotics.  Will obtain x-ray of the chest as well as x-ray of the foot.  Does not make any urine.  Blood culture sent.  X-ray of the foot is concerning for osteomyelitis of the first toe.  After a small 500 cc fluid bolus and Tylenol he is back in sinus rhythm with normal rates.  Does look improved.  He is not resisting admission at this time.  I discussed with the hospitalist.  Will need to be discussion in the morning about what additional care he wants.  May be reasonable to consider transition him to hospice.       FINAL CLINICAL IMPRESSION(S) / ED DIAGNOSES   Final diagnoses:  Sepsis, due to unspecified organism, unspecified whether acute organ dysfunction present  Osteomyelitis of left foot, unspecified type     Rx / DC Orders   ED Discharge Orders     None        Note:  This document was prepared using Dragon voice recognition software and may include unintentional dictation errors.   Georga Hacking, MD 10/26/22 2249    Georga Hacking, MD 10/26/22 2249

## 2022-10-26 NOTE — Code Documentation (Signed)
CODE SEPSIS - PHARMACY COMMUNICATION  **Broad Spectrum Antibiotics should be administered within 1 hour of Sepsis diagnosis**  Time Code Sepsis Called/Page Received: 2101  Antibiotics Ordered: cefepime, vancomycin  Time of 1st antibiotic administration: 2109   Selinda Eon ,PharmD Clinical Pharmacist  10/26/2022  9:12 PM

## 2022-10-26 NOTE — ED Triage Notes (Addendum)
Pt arrived via [facility] from home, reports fever, tremors, shortness of breath, pt has ESRD, last treatment was yesterday completed treatment, reports tremors yesterday.  Per EMS pt also found to have some confusion.  Pt was 86% on RA, placed on 4L [place] up to 96% with EMS.  Axillary temp of 103.6  Pt also has L foot infection.  Sepsis alert called by EMS prior to arrival .  Dr. Sidney Ace in room when pt arrived, will be placing orders.

## 2022-10-26 NOTE — H&P (Signed)
History and Physical    Patient: Timothy Gibson RUE:[id number] DOB: September 23, 1950 DOA: 10/26/2022 DOS: the patient was seen and examined on 10/26/2022 PCP: Steele Sizer, MD  Patient coming from: Home  Chief Complaint:  Chief Complaint  Patient presents with   Fever   Wound Infection   HPI: Timothy Gibson is a 72 y.o. male with medical history significant of end-stage renal disease on hemodialysis, essential hypertension, type 2 diabetes, who is on hemodialysis Mondays Wednesdays and Fridays.  Patient apparently has been dialyzed yesterday but decided not to have anymore dialysis and is contemplating stopping dialysis.  He was trying to get in contact with the nephrologist and then got sick today with fever chills and was brought to the ER by a friend.  Patient notably has bilateral lower extremity cellulitis with an ulcer on the left lateral aspect of the foot.  He denies any injury.  Patient appears to have sepsis secondary to the cellulitis.  He is being admitted for management of the sepsis and also for discussion regarding continuation of hemodialysis which she will be tomorrow.  Review of Systems: As mentioned in the history of present illness. All other systems reviewed and are negative. Past Medical History:  Diagnosis Date   Renal disorder    History reviewed. No pertinent surgical history. Social History:  reports that he has never smoked. He has never used smokeless tobacco. No history on file for alcohol use and drug use.  Allergies  Allergen Reactions   Tape     Pulls skin off when removed, must use the paper tape instead    History reviewed. No pertinent family history.  Prior to Admission medications   Medication Sig Start Date End Date Taking? Authorizing Provider  amLODipine (NORVASC) 5 MG tablet Take 5 mg by mouth daily. 10/18/22  Yes [provider]  Ernestina Patches 62.5-25 MCG/ACT AEPB Inhale 1 puff into the lungs daily. 08/30/22  Yes [provider]   busPIRone (BUSPAR) 10 MG tablet Take 10 mg by mouth 2 (two) times daily. 09/27/22  Yes [provider]  Insulin Glargine (BASAGLAR KWIKPEN) 100 UNIT/ML Inject 20 Units into the skin at bedtime. 08/21/22  Yes [provider]  insulin lispro (HUMALOG) 100 UNIT/ML injection Inject 2 Units into the skin 3 (three) times daily before meals.   Yes [provider]  metoprolol succinate (TOPROL-XL) 25 MG 24 hr tablet Take 25 mg by mouth daily. 08/22/22  Yes [provider]  traZODone (DESYREL) 50 MG tablet SMARTSIG:1 Tablet(s) By Mouth Every Evening 09/01/22  Yes [provider]  TRELEGY ELLIPTA 100-62.5-25 MCG/ACT AEPB Inhale 1 puff into the lungs daily. 10/24/22  Yes [provider]    Physical Exam: Vitals:   10/26/22 2015 10/26/22 2031 10/26/22 2130 10/26/22 2250  BP: 119/87  (!) 172/57 (!) 119/49  Pulse: (!) 130  (!) 113 82  Resp: (!) 26  (!) 29 15  Temp:      TempSrc:      SpO2: 100% 98% 100% 98%  Height:       Constitutional: Chronically ill looking, no obvious distress, calm, comfortable Eyes: PERRL, lids and conjunctivae normal ENMT: Mucous membranes are moist. Posterior pharynx clear of any exudate or lesions.Normal dentition.  Neck: normal, supple, no masses, no thyromegaly Respiratory: clear to auscultation bilaterally, no wheezing, no crackles. Normal respiratory effort. No accessory muscle use.  Cardiovascular: Regular rate and rhythm, no murmurs / rubs / gallops. No extremity edema. 2+ pedal pulses. No carotid  bruits.  Abdomen: no tenderness, no masses palpated. No hepatosplenomegaly. Bowel sounds positive.  Musculoskeletal: Good range of motion, no joint swelling or tenderness, Skin: Bilateral lower extremity cellulitis with left foot ulcer which appears infected. No induration Neurologic: CN 2-12 grossly intact. Sensation intact, DTR normal. Strength 5/5 in all 4.  Psychiatric: Normal judgment and insight. Alert and oriented x 3.  Normal mood  Data Reviewed:  Lactic acid 2.3 temperature 99.8, pulse 130, blood pressure 119/49, hemoglobin 8.7 BUN 58 and creatinine 10.18.  COVID-19 is negative.  Chest x-ray showed cardiomegaly with interstitial edema, x-ray of the left foot showed suspected osteomyelitis involving the first distal phalanx.  Assessment and Plan:  #1 sepsis due to osteomyelitis of the foot: Patient will be admitted.  Initiate IV antibiotics for empiric care.  Blood cultures obtained.  Podiatry consult if patient agrees.  He has decided to withdraw some level of care.  #2 end-stage renal disease: Hemodialysis Mondays Wednesdays and Fridays.  Patient is contemplating stopping hemodialysis altogether.  He was to discuss with nephrology in the morning.  #3 essential hypertension: We will confirm will continue home regimen.  #4 anemia of chronic disease: H&H notable and appears to be at baseline.  Continue to monitor  #5 cellulitis and osteomyelitis of the feet: Left more than right.  Continue treatment as above.  #6 depression: Patient is on antidepressants from home.  He is currently talking about quitting dialysis altogether.  May require psychiatric evaluation to make sure patient is making decision with full capacity.     Advance Care Planning:   Code Status: DNR   Consults: Nephrology in the morning  Family Communication: Friend at bedside but patient has no family  Severity of Illness: The appropriate patient status for this patient is INPATIENT. Inpatient status is judged to be reasonable and necessary in order to provide the required intensity of service to ensure the patient's safety. The patient's presenting symptoms, physical exam findings, and initial radiographic and laboratory data in the context of their chronic comorbidities is felt to place them at high risk for further clinical deterioration. Furthermore, it is not anticipated that the patient will be medically stable for discharge from  the hospital within 2 midnights of admission.   * I certify that at the point of admission it is my clinical judgment that the patient will require inpatient hospital care spanning beyond 2 midnights from the point of admission due to high intensity of service, high risk for further deterioration and high frequency of surveillance required.*  AuthorLonia Blood, MD 10/26/2022 11:25 PM  For on call review [email or web address].

## 2022-10-26 NOTE — ED Notes (Addendum)
Pt alert to self but confused. Pt has tremors in upper body and has L foot infection. Pt wanting to refuse various parts of care offered and has recently not wanted to go to dialysis and requesting to be on hospice. Family friend at bedside.

## 2022-10-27 ENCOUNTER — Telehealth: Payer: Self-pay | Admitting: Nurse Practitioner

## 2022-10-27 ENCOUNTER — Encounter: Payer: Self-pay | Admitting: Internal Medicine

## 2022-10-27 DIAGNOSIS — M869 Osteomyelitis, unspecified: Secondary | ICD-10-CM | POA: Diagnosis not present

## 2022-10-27 DIAGNOSIS — L03116 Cellulitis of left lower limb: Secondary | ICD-10-CM

## 2022-10-27 DIAGNOSIS — L03115 Cellulitis of right lower limb: Secondary | ICD-10-CM

## 2022-10-27 DIAGNOSIS — N186 End stage renal disease: Secondary | ICD-10-CM

## 2022-10-27 DIAGNOSIS — L0390 Cellulitis, unspecified: Secondary | ICD-10-CM

## 2022-10-27 DIAGNOSIS — F4321 Adjustment disorder with depressed mood: Secondary | ICD-10-CM | POA: Diagnosis not present

## 2022-10-27 DIAGNOSIS — Z794 Long term (current) use of insulin: Secondary | ICD-10-CM

## 2022-10-27 DIAGNOSIS — I10 Essential (primary) hypertension: Secondary | ICD-10-CM

## 2022-10-27 DIAGNOSIS — E118 Type 2 diabetes mellitus with unspecified complications: Secondary | ICD-10-CM

## 2022-10-27 DIAGNOSIS — A419 Sepsis, unspecified organism: Principal | ICD-10-CM

## 2022-10-27 LAB — CBC
HCT: 21.4 % — ABNORMAL LOW (ref 39.0–52.0)
HCT: 24.9 % — ABNORMAL LOW (ref 39.0–52.0)
Hemoglobin: 6.6 g/dL — ABNORMAL LOW (ref 13.0–17.0)
Hemoglobin: 7.7 g/dL — ABNORMAL LOW (ref 13.0–17.0)
MCH: 31.3 pg (ref 26.0–34.0)
MCH: 31.3 pg (ref 26.0–34.0)
MCHC: 30.8 g/dL (ref 30.0–36.0)
MCHC: 30.9 g/dL (ref 30.0–36.0)
MCV: 101.2 fL — ABNORMAL HIGH (ref 80.0–100.0)
MCV: 101.4 fL — ABNORMAL HIGH (ref 80.0–100.0)
Platelets: 127 10*3/uL — ABNORMAL LOW (ref 150–400)
Platelets: 145 10*3/uL — ABNORMAL LOW (ref 150–400)
RBC: 2.11 MIL/uL — ABNORMAL LOW (ref 4.22–5.81)
RBC: 2.46 MIL/uL — ABNORMAL LOW (ref 4.22–5.81)
RDW: 13.5 % (ref 11.5–15.5)
RDW: 13.5 % (ref 11.5–15.5)
WBC: 7.7 10*3/uL (ref 4.0–10.5)
WBC: 7.8 10*3/uL (ref 4.0–10.5)
nRBC: 0 % (ref 0.0–0.2)
nRBC: 0 % (ref 0.0–0.2)

## 2022-10-27 LAB — HEMOGLOBIN A1C
Hgb A1c MFr Bld: 8.2 % — ABNORMAL HIGH (ref 4.8–5.6)
Mean Plasma Glucose: 188.64 mg/dL

## 2022-10-27 LAB — CORTISOL-AM, BLOOD: Cortisol - AM: 15.8 ug/dL (ref 6.7–22.6)

## 2022-10-27 LAB — COMPREHENSIVE METABOLIC PANEL
ALT: 11 U/L (ref 0–44)
AST: 16 U/L (ref 15–41)
Albumin: 2.7 g/dL — ABNORMAL LOW (ref 3.5–5.0)
Alkaline Phosphatase: 64 U/L (ref 38–126)
Anion gap: 16 — ABNORMAL HIGH (ref 5–15)
BUN: 62 mg/dL — ABNORMAL HIGH (ref 8–23)
CO2: 25 mmol/L (ref 22–32)
Calcium: 9.2 mg/dL (ref 8.9–10.3)
Chloride: 99 mmol/L (ref 98–111)
Creatinine, Ser: 10.44 mg/dL — ABNORMAL HIGH (ref 0.61–1.24)
GFR, Estimated: 5 mL/min — ABNORMAL LOW (ref >60)
Glucose, Bld: 211 mg/dL — ABNORMAL HIGH (ref 70–99)
Potassium: 4.3 mmol/L (ref 3.5–5.1)
Sodium: 140 mmol/L (ref 135–145)
Total Bilirubin: 0.8 mg/dL (ref 0.3–1.2)
Total Protein: 6.7 g/dL (ref 6.5–8.1)

## 2022-10-27 LAB — BLOOD CULTURE ID PANEL (REFLEXED) - BCID2

## 2022-10-27 LAB — CBG MONITORING, ED
Glucose-Capillary: 188 mg/dL — ABNORMAL HIGH (ref 70–99)
Glucose-Capillary: 198 mg/dL — ABNORMAL HIGH (ref 70–99)
Glucose-Capillary: 307 mg/dL — ABNORMAL HIGH (ref 70–99)

## 2022-10-27 LAB — PROTIME-INR
INR: 1.5 — ABNORMAL HIGH (ref 0.8–1.2)
Prothrombin Time: 17.7 seconds — ABNORMAL HIGH (ref 11.4–15.2)

## 2022-10-27 LAB — CREATININE, SERUM
Creatinine, Ser: 9.9 mg/dL — ABNORMAL HIGH (ref 0.61–1.24)
GFR, Estimated: 5 mL/min — ABNORMAL LOW (ref >60)

## 2022-10-27 LAB — CULTURE, BLOOD (ROUTINE X 2)

## 2022-10-27 LAB — PROCALCITONIN: Procalcitonin: 6.47 ng/mL

## 2022-10-27 MED ORDER — CEFAZOLIN SODIUM-DEXTROSE 1-4 GM/50ML-% IV SOLN: 1.0000 g | Freq: Every day | INTRAVENOUS | Status: DC

## 2022-10-27 MED ORDER — CEFAZOLIN SODIUM-DEXTROSE 2-4 GM/100ML-% IV SOLN
2.0000 g | Freq: Once | INTRAVENOUS | Status: AC
  Administered 2022-10-27: 2 g via INTRAVENOUS
  Filled 2022-10-27: qty 100

## 2022-10-27 NOTE — Telephone Encounter (Unsigned)
Copied from CRM [id number]. Topic: General - Other >> Oct 27, 2022  2:57 PM Carrielelia G wrote: Reason for CRM: Prior authorization needed for back and knee brace. [employer] will be faxing over a DME form today to the fax # [phone] Please complete and return

## 2022-10-27 NOTE — ED Notes (Signed)
Report accepted by Hospice RN Larita Fife.

## 2022-10-27 NOTE — ED Notes (Signed)
Pt stated he "just wants to get the hell out of here" and does not like [facility] ED facility because we will not let him leave. RN educated pt on previous conversation between Drs, Charity fundraiser, pt, and his caregiver regarding discontinuation of treatments and transfer to hospital facility. Pt was boisterous in his distaste for having to wait for transport but agreed to continue to wait.

## 2022-10-27 NOTE — ED Notes (Signed)
Pt will transport to hospice house :00 via [facility]

## 2022-10-27 NOTE — ED Notes (Signed)
Pt cbg 188. Rn, Hospital doctor was notified.

## 2022-10-27 NOTE — Progress Notes (Signed)
PHARMACY - PHYSICIAN COMMUNICATION CRITICAL VALUE ALERT - BLOOD CULTURE IDENTIFICATION (BCID)  Timothy Gibson is an 72 y.o. male who presented to [facility] on 10/26/2022 with a chief complaint of fever, tremor, and shortness of breath  Assessment:  blood cultures from 4/18 with [facility] In 4/4 bottles, BCID detects MSSA.  He has LLE ulcer that appears infected (per notes) with cellulitis.  He is also HD patient w/ L AV fistula  Name of physician (or Provider) Contacted: Dr Nelson Chimes  Current antibiotics: vancomycin/cefepime/metronidazole x 1 in ED 4/18 PM  Changes to prescribed antibiotics recommended:  Recommendations accepted by provider Start Cefazolin pending goals of care discussions Since HD patient give cefazolin 2gm IV x 1 now then 1gm IV q24h.    Results for orders placed or performed during the hospital encounter of 10/26/22  Blood Culture ID Panel (Reflexed) (Collected: 10/26/2022  8:40 PM)  Result Value Ref Range   Enterococcus faecalis NOT DETECTED NOT DETECTED   Enterococcus Faecium NOT DETECTED NOT DETECTED   Listeria monocytogenes NOT DETECTED NOT DETECTED   Staphylococcus species DETECTED (A) NOT DETECTED   Staphylococcus aureus (BCID) DETECTED (A) NOT DETECTED   Staphylococcus epidermidis NOT DETECTED NOT DETECTED   Staphylococcus lugdunensis NOT DETECTED NOT DETECTED   Streptococcus species NOT DETECTED NOT DETECTED   Streptococcus agalactiae NOT DETECTED NOT DETECTED   Streptococcus pneumoniae NOT DETECTED NOT DETECTED   Streptococcus pyogenes NOT DETECTED NOT DETECTED   A.calcoaceticus-baumannii NOT DETECTED NOT DETECTED   Bacteroides fragilis NOT DETECTED NOT DETECTED   Enterobacterales NOT DETECTED NOT DETECTED   Enterobacter cloacae complex NOT DETECTED NOT DETECTED   Escherichia coli NOT DETECTED NOT DETECTED   Klebsiella aerogenes NOT DETECTED NOT DETECTED   Klebsiella oxytoca NOT DETECTED NOT DETECTED   Klebsiella pneumoniae NOT DETECTED NOT DETECTED   Proteus  species NOT DETECTED NOT DETECTED   Salmonella species NOT DETECTED NOT DETECTED   Serratia marcescens NOT DETECTED NOT DETECTED   Haemophilus influenzae NOT DETECTED NOT DETECTED   Neisseria meningitidis NOT DETECTED NOT DETECTED   Pseudomonas aeruginosa NOT DETECTED NOT DETECTED   Stenotrophomonas maltophilia NOT DETECTED NOT DETECTED   Candida albicans NOT DETECTED NOT DETECTED   Candida auris NOT DETECTED NOT DETECTED   Candida glabrata NOT DETECTED NOT DETECTED   Candida krusei NOT DETECTED NOT DETECTED   Candida parapsilosis NOT DETECTED NOT DETECTED   Candida tropicalis NOT DETECTED NOT DETECTED   Cryptococcus neoformans/gattii NOT DETECTED NOT DETECTED   Meth resistant mecA/C and MREJ NOT DETECTED NOT DETECTED    Juliette Alcide, PharmD, BCPS, BCIDP Work Cell: [phone] 10/27/2022 9:30 AM

## 2022-10-27 NOTE — ED Notes (Signed)
[facility] arrived to transport patient to hospice.

## 2022-10-27 NOTE — Progress Notes (Signed)
[facility] ED [facility]) Hospice hospital liaison note   Referral received for Hospice Home from patient's dialysis social worker. Met with patient and friend and services and hospice philosophy were discussed.    Hospice home is able to accept patient this afternoon once consents are complete.    RN staff, you may call report at any time to [phone], room is assigned when report is called.  Please leave IV intact.    Please send completed DNR with patient.   Updated attending and [facility] manager via [employer]. Thank you for the opportunity to participate in this patient's care Thea Gist, BSN, RN Hospice nurse liaison [phone]

## 2022-10-27 NOTE — Discharge Summary (Signed)
Physician Discharge Summary   Patient: Timothy Gibson MRN: [id number] DOB: May 17, 1951  Admit date:     10/26/2022  Discharge date: 10/27/22  Discharge Physician: Arnetha Courser   PCP: Steele Sizer, MD   Recommendations at discharge:  Patient is being discharged to hospice facility for end-of-life care  Discharge Diagnoses: Principal Problem:   Sepsis due to cellulitis Active Problems:   ESRD (end stage renal disease)   Bilateral lower leg cellulitis   Type 2 diabetes mellitus with complication, with long-term current use of insulin   Essential hypertension   Adjustment disorder with depressed mood   Osteomyelitis of left foot   Hospital Course: Taken from H&P.  Timothy Gibson is a 72 y.o. male with medical history significant of ESRD(MWF) Essential hypertension, type 2 diabetes came to ED with fever and chills.  Patient also found to have bilateral lower extremity cellulitis with an ulcer on the left lateral aspect of foot.  Admitted for concern of sepsis secondary to cellulitis. Left foot imaging with concern of osteomyelitis involving the first distal phalanx.  Apparently does not want to continue dialysis anymore.  Patient was started on broad-spectrum antibiotics.  4/19: Vitals with elevated blood pressure at 173/67.  No leukocytosis.  Received only 1 dose of cefepime, Flagyl and vancomycin.  Procalcitonin elevated at 6.47. Blood cultures positive for MSSA and antibiotics switched with cefazolin.  Later when met with patient he was very clear that he does not want any further medical management and wants to start dialysis.  He was alert and oriented and able to answer questions appropriately, stating that he is sick of his quality of life as he was struggling with dialysis for the past 19 years.  A caregiver at bedside and according to her she was taking care off him for the past 5 years.  Rapidly decline in his health over the past 76-month.  For the past 1 month he was  repeatedly telling his nephrologist and her that he wants to stop dialysis and would like to nature takeover.  He was missing dialysis repeatedly over the past 1 month which also resulted 1 recent hospitalization with fluid overload.  Last dialysis was on Wednesday and for which she really insisted him to go.  He was already given referral for hospice by his nephrologist.  We obtained psych evaluation to make sure that he has a full capacity and understand the consequences.  We also wanted to make sure that he is not doing this under depression or suicidal thoughts.  Patient does not have any relationship with his family and does not want his family to get involved in his care.  Psych evaluated him and found to be fully capable of making his choices.  He was not making these choices for suicidal reason or with depression.  He fully understands that with his medical illness being on dialysis if he stopped dialysis he will die.  Hospice was also involved.  He qualifies going for inpatient hospice services.  Active medical management was withdrawn and he is being discharged to hospice home for end-of-life care.  Patient has a very poor prognosis.     Consultants: Nephrology.  Infectious disease.  Psychiatry Procedures performed: None Disposition: Hospice care Diet recommendation:  Discharge Diet Orders (From admission, onward)     Start     Ordered   10/27/22 0000  Diet - low sodium heart healthy        10/27/22 1433  Regular diet DISCHARGE MEDICATION: Allergies as of 10/27/2022       Reactions   Tape    Pulls skin off when removed, must use the paper tape instead        Medication List     STOP taking these medications    amLODipine 5 MG tablet Commonly known as: NORVASC   Basaglar KwikPen 100 UNIT/ML   busPIRone 10 MG tablet Commonly known as: BUSPAR   insulin lispro 100 UNIT/ML injection Commonly known as: HUMALOG   metoprolol succinate 25 MG 24 hr  tablet Commonly known as: TOPROL-XL   traZODone 50 MG tablet Commonly known as: DESYREL       TAKE these medications    Anoro Ellipta 62.5-25 MCG/ACT Aepb Generic drug: umeclidinium-vilanterol Inhale 1 puff into the lungs daily.   Trelegy Ellipta 100-62.5-25 MCG/ACT Aepb Generic drug: Fluticasone-Umeclidin-Vilant Inhale 1 puff into the lungs daily.        Discharge Exam: There were no vitals filed for this visit. General.  Chronically ill-appearing gentleman, in no acute distress. Pulmonary.  Lungs clear bilaterally, normal respiratory effort. CV.  Regular rate and rhythm, no JVD, rub or murmur. Abdomen.  Soft, nontender, nondistended, BS positive. CNS.  Alert and oriented .  No focal neurologic deficit. Extremities.  2+ LE edema with erythema and wound Psychiatry.  Judgment and insight appears normal.   Condition at discharge: stable  The results of significant diagnostics from this hospitalization (including imaging, microbiology, ancillary and laboratory) are listed below for reference.   Imaging Studies: DG Foot 2 Views Left  Result Date: 10/26/2022 CLINICAL DATA:  Foot infection EXAM: LEFT FOOT - 2 VIEW COMPARISON:  None Available. FINDINGS: Destructive changes involving the distal aspect of the 1st distal phalanx, with overlying soft tissue injury/ulceration, suggesting acute osteomyelitis. Mild degenerative changes, including at the 1st MTP joint. Vascular calcifications. IMPRESSION: Suspected osteomyelitis involving the 1st distal phalanx, as above. Electronically Signed   By: Charline Bills M.D.   On: 10/26/2022 22:27   DG Chest Portable 1 View  Result Date: 10/26/2022 CLINICAL DATA:  Shortness of breath EXAM: PORTABLE CHEST 1 VIEW COMPARISON:  10/17/2022 FINDINGS: Cardiomegaly with mild interstitial edema. Mild right basilar atelectasis with eventration of the right hemidiaphragm. No pleural effusion or pneumothorax. Thoracic aortic atherosclerosis.  IMPRESSION: Cardiomegaly with mild interstitial edema. Electronically Signed   By: Charline Bills M.D.   On: 10/26/2022 21:37    Microbiology: Results for orders placed or performed during the hospital encounter of 10/26/22  SARS Coronavirus 2 by RT PCR (hospital order, performed in [facility] hospital lab) *cepheid single result test* Anterior Nasal Swab     Status: None   Collection Time: 10/26/22  8:23 PM   Specimen: Anterior Nasal Swab  Result Value Ref Range Status   SARS Coronavirus 2 by RT PCR NEGATIVE NEGATIVE Final    Comment: (NOTE) SARS-CoV-2 target nucleic acids are NOT DETECTED.  The SARS-CoV-2 RNA is generally detectable in upper and lower respiratory specimens during the acute phase of infection. The lowest concentration of SARS-CoV-2 viral copies this assay can detect is 250 copies / mL. A negative result does not preclude SARS-CoV-2 infection and should not be used as the sole basis for treatment or other patient management decisions.  A negative result may occur with improper specimen collection / handling, submission of specimen other than nasopharyngeal swab, presence of viral mutation(s) within the areas targeted by this assay, and inadequate number of viral copies (<250 copies / mL). A  negative result must be combined with clinical observations, patient history, and epidemiological information.  Fact Sheet for Patients:   RoadLapTop.co.za  Fact Sheet for Healthcare Providers: http://kim-miller.com/  This test is not yet approved or  cleared by the [place] FDA and has been authorized for detection and/or diagnosis of SARS-CoV-2 by FDA under an Emergency Use Authorization (EUA).  This EUA will remain in effect (meaning this test can be used) for the duration of the COVID-19 declaration under Section 564(b)(1) of the Act, 21 U.S.C. section 360bbb-3(b)(1), unless the authorization is terminated or revoked  sooner.  Performed at [facility], [address] [place], [place] [address]   Blood culture (routine x 2)     Status: None (Preliminary result)   Collection Time: 10/26/22  8:40 PM   Specimen: BLOOD  Result Value Ref Range Status   Specimen Description BLOOD BLOOD RIGHT ARM  Final   Special Requests   Final    BOTTLES DRAWN AEROBIC AND ANAEROBIC Blood Culture results may not be optimal due to an excessive volume of blood received in culture bottles   Culture  Setup Time   Final    Organism ID to follow IN BOTH AEROBIC AND ANAEROBIC BOTTLES GRAM POSITIVE COCCI CRITICAL RESULT CALLED TO, READ BACK BY AND VERIFIED WITH: DEVON MITCHELL 10/27/22 6045 KLW Performed at [facility], [address] [place], [place] [address]    Culture GRAM POSITIVE COCCI  Final   Report Status PENDING  Incomplete  Blood Culture ID Panel (Reflexed)     Status: Abnormal   Collection Time: 10/26/22  8:40 PM  Result Value Ref Range Status   Enterococcus faecalis NOT DETECTED NOT DETECTED Final   Enterococcus Faecium NOT DETECTED NOT DETECTED Final   Listeria monocytogenes NOT DETECTED NOT DETECTED Final   Staphylococcus species DETECTED (A) NOT DETECTED Final    Comment: CRITICAL RESULT CALLED TO, READ BACK BY AND VERIFIED WITH: DEVAN MITCHELL 10/27/22 0918 KLW    Staphylococcus aureus (BCID) DETECTED (A) NOT DETECTED Final    Comment: CRITICAL RESULT CALLED TO, READ BACK BY AND VERIFIED WITH: DEVAN MITCHELL 10/27/22 0918 KLW    Staphylococcus epidermidis NOT DETECTED NOT DETECTED Final   Staphylococcus lugdunensis NOT DETECTED NOT DETECTED Final   Streptococcus species NOT DETECTED NOT DETECTED Final   Streptococcus agalactiae NOT DETECTED NOT DETECTED Final   Streptococcus pneumoniae NOT DETECTED NOT DETECTED Final   Streptococcus pyogenes NOT DETECTED NOT DETECTED Final   A.calcoaceticus-baumannii NOT DETECTED NOT DETECTED Final   Bacteroides fragilis NOT DETECTED NOT DETECTED  Final   Enterobacterales NOT DETECTED NOT DETECTED Final   Enterobacter cloacae complex NOT DETECTED NOT DETECTED Final   Escherichia coli NOT DETECTED NOT DETECTED Final   Klebsiella aerogenes NOT DETECTED NOT DETECTED Final   Klebsiella oxytoca NOT DETECTED NOT DETECTED Final   Klebsiella pneumoniae NOT DETECTED NOT DETECTED Final   Proteus species NOT DETECTED NOT DETECTED Final   Salmonella species NOT DETECTED NOT DETECTED Final   Serratia marcescens NOT DETECTED NOT DETECTED Final   Haemophilus influenzae NOT DETECTED NOT DETECTED Final   Neisseria meningitidis NOT DETECTED NOT DETECTED Final   Pseudomonas aeruginosa NOT DETECTED NOT DETECTED Final   Stenotrophomonas maltophilia NOT DETECTED NOT DETECTED Final   Candida albicans NOT DETECTED NOT DETECTED Final   Candida auris NOT DETECTED NOT DETECTED Final   Candida glabrata NOT DETECTED NOT DETECTED Final   Candida krusei NOT DETECTED NOT DETECTED Final   Candida parapsilosis NOT DETECTED  NOT DETECTED Final   Candida tropicalis NOT DETECTED NOT DETECTED Final   Cryptococcus neoformans/gattii NOT DETECTED NOT DETECTED Final   Meth resistant mecA/C and MREJ NOT DETECTED NOT DETECTED Final    Comment: Performed at [facility], [address] [place], [place] [address]  Blood culture (routine x 2)     Status: None (Preliminary result)   Collection Time: 10/26/22  8:45 PM   Specimen: BLOOD  Result Value Ref Range Status   Specimen Description BLOOD BLOOD RIGHT HAND  Final   Special Requests   Final    BOTTLES DRAWN AEROBIC AND ANAEROBIC Blood Culture results may not be optimal due to an inadequate volume of blood received in culture bottles   Culture  Setup Time   Final    IN BOTH AEROBIC AND ANAEROBIC BOTTLES GRAM POSITIVE COCCI CRITICAL RESULT CALLED TO, READ BACK BY AND VERIFIED WITH: [facility] MITCHELL 10/27/22 6045 KLW Performed at [facility], [address] [place], [place] [address]    Culture [facility]  POSITIVE COCCI  Final   Report Status PENDING  Incomplete    Labs: CBC: Recent Labs  Lab 10/26/22 2022 10/27/22 0016 10/27/22 0517  WBC 6.9 7.8 7.7  NEUTROABS 6.1  --   --   HGB 8.7* 6.6* 7.7*  HCT 28.4* 21.4* 24.9*  MCV 100.4* 101.4* 101.2*  PLT 164 127* 145*   Basic Metabolic Panel: Recent Labs  Lab 10/26/22 2022 10/27/22 0016 10/27/22 0517  NA 139  --  140  K 4.1  --  4.3  CL 96*  --  99  CO2 25  --  25  GLUCOSE 318*  --  211*  BUN 58*  --  62*  CREATININE 10.18* 9.90* 10.44*  CALCIUM 9.6  --  9.2   Liver Function Tests: Recent Labs  Lab 10/26/22 2022 10/27/22 0517  AST 18 16  ALT 13 11  ALKPHOS 73 64  BILITOT 1.0 0.8  PROT 7.7 6.7  ALBUMIN 3.1* 2.7*   CBG: Recent Labs  Lab 10/27/22 0024 10/27/22 0752 10/27/22 1222  GLUCAP 307* 188* 198*    Discharge time spent: greater than 30 minutes.  This record has been created using Conservation officer, historic buildings. Errors have been sought and corrected,but may not always be located. Such creation errors do not reflect on the standard of care.   Signed: Arnetha Courser, MD [facility] 10/27/2022

## 2022-10-27 NOTE — ED Notes (Signed)
Patient's roommate at bedside

## 2022-10-27 NOTE — ED Notes (Signed)
Patient set up with breakfast tray. Ate 25%. This RN helped patient eat peach fruit cup

## 2022-10-27 NOTE — ED Notes (Signed)
Pt refused glucose check and Insulin administration. Pt has become increasingly agitated and expresses wishes to go home. RN educated pt on morning conversations regarding transportation to the Hospice facility.

## 2022-10-27 NOTE — ED Notes (Signed)
Pt transitioned to a hospital bed to promote comfort with the assistance of EDT Adair Laundry) & Sherilyn Cooter, RN. No other concerns at this time.

## 2022-10-27 NOTE — Hospital Course (Addendum)
Taken from H&P.  Timothy Gibson is a 72 y.o. male with medical history significant of ESRD(MWF) Essential hypertension, type 2 diabetes came to ED with fever and chills.  Patient also found to have bilateral lower extremity cellulitis with an ulcer on the left lateral aspect of foot.  Admitted for concern of sepsis secondary to cellulitis. Left foot imaging with concern of osteomyelitis involving the first distal phalanx.  Apparently does not want to continue dialysis anymore.  Patient was started on broad-spectrum antibiotics.  4/19: Vitals with elevated blood pressure at 173/67.  No leukocytosis.  Received only 1 dose of cefepime, Flagyl and vancomycin.  Procalcitonin elevated at 6.47. Blood cultures positive for MSSA and antibiotics switched with cefazolin.  Later when met with patient he was very clear that he does not want any further medical management and wants to start dialysis.  He was alert and oriented and able to answer questions appropriately, stating that he is sick of his quality of life as he was struggling with dialysis for the past 19 years.  A caregiver at bedside and according to her she was taking care off him for the past 5 years.  Rapidly decline in his health over the past 33-month.  For the past 1 month he was repeatedly telling his nephrologist and her that he wants to stop dialysis and would like to nature takeover.  He was missing dialysis repeatedly over the past 1 month which also resulted 1 recent hospitalization with fluid overload.  Last dialysis was on Wednesday and for which she really insisted him to go.  He was already given referral for hospice by his nephrologist.  We obtained psych evaluation to make sure that he has a full capacity and understand the consequences.  We also wanted to make sure that he is not doing this under depression or suicidal thoughts.  Patient does not have any relationship with his family and does not want his family to get involved in  his care.  Psych evaluated him and found to be fully capable of making his choices.  He was not making these choices for suicidal reason or with depression.  He fully understands that with his medical illness being on dialysis if he stopped dialysis he will die.  Hospice was also involved.  He qualifies going for inpatient hospice services.  Active medical management was withdrawn and he is being discharged to hospice home for end-of-life care.  Patient has a very poor prognosis.

## 2022-10-27 NOTE — ED Notes (Signed)
Notified MD of patients most recent BP and states they are his normal and are fine for now.

## 2022-10-27 NOTE — ED Notes (Signed)
Pt states he refuses to eat food provided by hospital. RN offered alternative crackers and water. Pt accepted snack/drink substitute.

## 2022-10-27 NOTE — Consult Note (Signed)
[facility] Face-to-Face Psychiatry Consult   Reason for Consult:  capacity, refusing dialysis Referring Physician:  Dr. Nelson Chimes Patient Identification: Timothy Gibson MRN:  [id number] Principal Diagnosis: Sepsis due to cellulitis Diagnosis:  Principal Problem:   Sepsis due to cellulitis Active Problems:   ESRD (end stage renal disease)   Bilateral lower leg cellulitis   Type 2 diabetes mellitus with complication, with long-term current use of insulin   Essential hypertension   Adjustment disorder with depressed mood   Total Time spent with patient: 45 minutes  Subjective:   Timothy Gibson is a 72 y.o. male patient admitted with sepsis related to cellulitis.  HPI:  72 yo male who presented to the ED with his caregiver with cellulitis, consult placed for refusal of treatment and dialysis.  When asked the reasoning to stop dialysis, he stated, "I'm tired of it, 19 years is enough for anyone."  When asked if he knew what would happen if he stopped, "I'm going to die".  Depression evaluated, "I have some all the time", no suicidal ideations, "I love me".  Denies homicidal ideations, hallucinations, or substance abuse.  He is alert and oriented times three with the ability to complete the assessment with no confusion or cognitive concerns noted, he does have capacity. His caregiver of 5 years that he lives with is at his bedside and reports he has been tired for the past 3 months and evaluated the decision of continuing dialysis.  He decided 3 weeks ago that he wanted to stop.  He told her "I'm done, I don't want to do this anymore."  He made and paid for his funeral arrangements and requests Hospice to have a "pain free death".  She has also contacted his reverend.  She is supportive in his decision with no concerns of him doing this due to mental health concerns.  He is alert and oriented times three, pleasant, cooperative, with the ability to complete the assessment with no confusion or cognitive concerns  noted, he does have capacity.   Past Psychiatric History: anxiety  Risk to Self:  none Risk to Others:  none Prior Inpatient Therapy:  none Prior Outpatient Therapy:  PCP  Past Medical History:  Past Medical History:  Diagnosis Date   Renal disorder    History reviewed. No pertinent surgical history. Family History: History reviewed. No pertinent family history. Family Psychiatric  History: none Social History:  Social History   Substance and Sexual Activity  Alcohol Use None     Social History   Substance and Sexual Activity  Drug Use Not on file    Social History   Socioeconomic History   Marital status: Divorced    Spouse name: Not on file   Number of children: Not on file   Years of education: Not on file   Highest education level: Not on file  Occupational History   Not on file  Tobacco Use   Smoking status: Never   Smokeless tobacco: Never  Substance and Sexual Activity   Alcohol use: Not on file   Drug use: Not on file   Sexual activity: Not on file  Other Topics Concern   Not on file  Social History Narrative   Not on file   Social Determinants of Health   Financial Resource Strain: Not on file  Food Insecurity: No Food Insecurity (10/27/2022)   Hunger Vital Sign    Worried About Running Out of Food in the Last Year: Never true    Ran Out of  Food in the Last Year: Never true  Transportation Needs: No Transportation Needs (10/27/2022)   PRAPARE - Administrator, Civil Service (Medical): No    Lack of Transportation (Non-Medical): No  Physical Activity: Not on file  Stress: Not on file  Social Connections: Not on file   Additional Social History:    Allergies:   Allergies  Allergen Reactions   Tape     Pulls skin off when removed, must use the paper tape instead    Labs:  Results for orders placed or performed during the hospital encounter of 10/26/22 (from the past 48 hour(s))  Comprehensive metabolic panel     Status:  Abnormal   Collection Time: 10/26/22  8:22 PM  Result Value Ref Range   Sodium 139 135 - 145 mmol/L   Potassium 4.1 3.5 - 5.1 mmol/L   Chloride 96 (L) 98 - 111 mmol/L   CO2 25 22 - 32 mmol/L   Glucose, Bld 318 (H) 70 - 99 mg/dL    Comment: Glucose reference range applies only to samples taken after fasting for at least 8 hours.   BUN 58 (H) 8 - 23 mg/dL   Creatinine, Ser 16.10 (H) 0.61 - 1.24 mg/dL   Calcium 9.6 8.9 - 96.0 mg/dL   Total Protein 7.7 6.5 - 8.1 g/dL   Albumin 3.1 (L) 3.5 - 5.0 g/dL   AST 18 15 - 41 U/L   ALT 13 0 - 44 U/L   Alkaline Phosphatase 73 38 - 126 U/L   Total Bilirubin 1.0 0.3 - 1.2 mg/dL   GFR, Estimated 5 (L) >60 mL/min    Comment: (NOTE) Calculated using the CKD-EPI Creatinine Equation (2021)    Anion gap 18 (H) 5 - 15    Comment: Performed at [facility], [address] [place], [place] [address]  CBC with Differential     Status: Abnormal   Collection Time: 10/26/22  8:22 PM  Result Value Ref Range   WBC 6.9 4.0 - 10.5 K/uL   RBC 2.83 (L) 4.22 - 5.81 MIL/uL   Hemoglobin 8.7 (L) 13.0 - 17.0 g/dL   HCT 81.1 (L) 91.4 - 78.2 %   MCV 100.4 (H) 80.0 - 100.0 fL   MCH 30.7 26.0 - 34.0 pg   MCHC 30.6 30.0 - 36.0 g/dL   RDW 95.6 21.3 - 08.6 %   Platelets 164 150 - 400 K/uL   nRBC 0.0 0.0 - 0.2 %   Neutrophils Relative % 89 %   Neutro Abs 6.1 1.7 - 7.7 K/uL   Lymphocytes Relative 5 %   Lymphs Abs 0.3 (L) 0.7 - 4.0 K/uL   Monocytes Relative 6 %   Monocytes Absolute 0.4 0.1 - 1.0 K/uL   Eosinophils Relative 0 %   Eosinophils Absolute 0.0 0.0 - 0.5 K/uL   Basophils Relative 0 %   Basophils Absolute 0.0 0.0 - 0.1 K/uL   Immature Granulocytes 0 %   Abs Immature Granulocytes 0.03 0.00 - 0.07 K/uL    Comment: Performed at [facility], [address] [place], [place] [address]  SARS Coronavirus 2 by RT PCR (hospital order, performed in [facility] hospital lab) *cepheid single result test* Anterior Nasal Swab     Status: None    Collection Time: 10/26/22  8:23 PM   Specimen: Anterior Nasal Swab  Result Value Ref Range   SARS Coronavirus 2 by RT PCR NEGATIVE NEGATIVE    Comment: (NOTE) SARS-CoV-2 target nucleic  acids are NOT DETECTED.  The SARS-CoV-2 RNA is generally detectable in upper and lower respiratory specimens during the acute phase of infection. The lowest concentration of SARS-CoV-2 viral copies this assay can detect is 250 copies / mL. A negative result does not preclude SARS-CoV-2 infection and should not be used as the sole basis for treatment or other patient management decisions.  A negative result may occur with improper specimen collection / handling, submission of specimen other than nasopharyngeal swab, presence of viral mutation(s) within the areas targeted by this assay, and inadequate number of viral copies (<250 copies / mL). A negative result must be combined with clinical observations, patient history, and epidemiological information.  Fact Sheet for Patients:   RoadLapTop.co.za  Fact Sheet for Healthcare Providers: http://kim-miller.com/  This test is not yet approved or  cleared by the [place] FDA and has been authorized for detection and/or diagnosis of SARS-CoV-2 by FDA under an Emergency Use Authorization (EUA).  This EUA will remain in effect (meaning this test can be used) for the duration of the COVID-19 declaration under Section 564(b)(1) of the Act, 21 U.S.C. section 360bbb-3(b)(1), unless the authorization is terminated or revoked sooner.  Performed at [facility], [address] [place], [place] [address]   Lactic acid, plasma     Status: Abnormal   Collection Time: 10/26/22  8:40 PM  Result Value Ref Range   Lactic Acid, Venous 2.3 (HH) 0.5 - 1.9 mmol/L    Comment: CRITICAL RESULT CALLED TO, READ BACK BY AND VERIFIED WITH Salley Hews RN  10/26/22 ASW Performed at [facility], [address] [place], [place] [address]   Blood culture (routine x 2)     Status: None (Preliminary result)   Collection Time: 10/26/22  8:40 PM   Specimen: BLOOD  Result Value Ref Range   Specimen Description BLOOD BLOOD RIGHT ARM    Special Requests      BOTTLES DRAWN AEROBIC AND ANAEROBIC Blood Culture results may not be optimal due to an excessive volume of blood received in culture bottles   Culture  Setup Time      Organism ID to follow IN BOTH AEROBIC AND ANAEROBIC BOTTLES GRAM POSITIVE COCCI CRITICAL RESULT CALLED TO, READ BACK BY AND VERIFIED WITH: DEVON MITCHELL 10/27/22 1308 KLW Performed at [facility], [address] [place], [place] [address]    Culture GRAM POSITIVE COCCI    Report Status PENDING   Blood Culture ID Panel (Reflexed)     Status: Abnormal   Collection Time: 10/26/22  8:40 PM  Result Value Ref Range   Enterococcus faecalis NOT DETECTED NOT DETECTED   Enterococcus Faecium NOT DETECTED NOT DETECTED   Listeria monocytogenes NOT DETECTED NOT DETECTED   Staphylococcus species DETECTED (A) NOT DETECTED    Comment: CRITICAL RESULT CALLED TO, READ BACK BY AND VERIFIED WITH: DEVAN MITCHELL 10/27/22 0918 KLW    Staphylococcus aureus (BCID) DETECTED (A) NOT DETECTED    Comment: CRITICAL RESULT CALLED TO, READ BACK BY AND VERIFIED WITH: DEVAN MITCHELL 10/27/22 0918 KLW    Staphylococcus epidermidis NOT DETECTED NOT DETECTED   Staphylococcus lugdunensis NOT DETECTED NOT DETECTED   Streptococcus species NOT DETECTED NOT DETECTED   Streptococcus agalactiae NOT DETECTED NOT DETECTED   Streptococcus pneumoniae NOT DETECTED NOT DETECTED   Streptococcus pyogenes NOT DETECTED NOT DETECTED   A.calcoaceticus-baumannii NOT DETECTED NOT DETECTED   Bacteroides fragilis NOT DETECTED NOT DETECTED   Enterobacterales NOT DETECTED NOT DETECTED  Enterobacter cloacae complex NOT DETECTED NOT DETECTED   Escherichia coli NOT DETECTED NOT DETECTED   Klebsiella aerogenes  NOT DETECTED NOT DETECTED   Klebsiella oxytoca NOT DETECTED NOT DETECTED   Klebsiella pneumoniae NOT DETECTED NOT DETECTED   Proteus species NOT DETECTED NOT DETECTED   Salmonella species NOT DETECTED NOT DETECTED   Serratia marcescens NOT DETECTED NOT DETECTED   Haemophilus influenzae NOT DETECTED NOT DETECTED   Neisseria meningitidis NOT DETECTED NOT DETECTED   Pseudomonas aeruginosa NOT DETECTED NOT DETECTED   Stenotrophomonas maltophilia NOT DETECTED NOT DETECTED   Candida albicans NOT DETECTED NOT DETECTED   Candida auris NOT DETECTED NOT DETECTED   Candida glabrata NOT DETECTED NOT DETECTED   Candida krusei NOT DETECTED NOT DETECTED   Candida parapsilosis NOT DETECTED NOT DETECTED   Candida tropicalis NOT DETECTED NOT DETECTED   Cryptococcus neoformans/gattii NOT DETECTED NOT DETECTED   Meth resistant mecA/C and MREJ NOT DETECTED NOT DETECTED    Comment: Performed at [facility], [address] [place], [place] [address]  Blood culture (routine x 2)     Status: None (Preliminary result)   Collection Time: 10/26/22  8:45 PM   Specimen: BLOOD  Result Value Ref Range   Specimen Description BLOOD BLOOD RIGHT HAND    Special Requests      BOTTLES DRAWN AEROBIC AND ANAEROBIC Blood Culture results may not be optimal due to an inadequate volume of blood received in culture bottles   Culture  Setup Time      IN BOTH AEROBIC AND ANAEROBIC BOTTLES GRAM POSITIVE COCCI CRITICAL RESULT CALLED TO, READ BACK BY AND VERIFIED WITH: [facility] MITCHELL 10/27/22 7846 KLW Performed at [facility], [address] [place], [place] [address]    Culture GRAM POSITIVE COCCI    Report Status PENDING   Lactic acid, plasma     Status: None   Collection Time: 10/26/22 11:05 PM  Result Value Ref Range   Lactic Acid, Venous 1.9 0.5 - 1.9 mmol/L    Comment: Performed at [facility], [address] [place], [place] [address]  CBC     Status: Abnormal   Collection Time:  10/27/22 12:16 AM  Result Value Ref Range   WBC 7.8 4.0 - 10.5 K/uL   RBC 2.11 (L) 4.22 - 5.81 MIL/uL   Hemoglobin 6.6 (L) 13.0 - 17.0 g/dL   HCT 24.4 (L) 01.0 - 27.2 %   MCV 101.4 (H) 80.0 - 100.0 fL   MCH 31.3 26.0 - 34.0 pg   MCHC 30.8 30.0 - 36.0 g/dL   RDW 53.6 64.4 - 03.4 %   Platelets 127 (L) 150 - 400 K/uL   nRBC 0.0 0.0 - 0.2 %    Comment: Performed at [facility], [address] [place], [place] [address]  Creatinine, serum     Status: Abnormal   Collection Time: 10/27/22 12:16 AM  Result Value Ref Range   Creatinine, Ser 9.90 (H) 0.61 - 1.24 mg/dL   GFR, Estimated 5 (L) >60 mL/min    Comment: (NOTE) Calculated using the CKD-EPI Creatinine Equation (2021) Performed at [facility], [address] [place], [place] [address]   Hemoglobin A1c     Status: Abnormal   Collection Time: 10/27/22 12:16 AM  Result Value Ref Range   Hgb A1c MFr Bld 8.2 (H) 4.8 - 5.6 %    Comment: (NOTE) Pre diabetes:          5.7%-6.4%  Diabetes:              >  6.4%  Glycemic control for   <7.0% adults with diabetes    Mean Plasma Glucose 188.64 mg/dL    Comment: Performed at [facility], [address] [place], [place] [address]  CBG monitoring, ED     Status: Abnormal   Collection Time: 10/27/22 12:24 AM  Result Value Ref Range   Glucose-Capillary 307 (H) 70 - 99 mg/dL    Comment: Glucose reference range applies only to samples taken after fasting for at least 8 hours.  Protime-INR     Status: Abnormal   Collection Time: 10/27/22  5:17 AM  Result Value Ref Range   Prothrombin Time 17.7 (H) 11.4 - 15.2 seconds   INR 1.5 (H) 0.8 - 1.2    Comment: (NOTE) INR goal varies based on device and disease states. Performed at [facility], [address] [place], [place] [address]   Cortisol-am, blood     Status: None   Collection Time: 10/27/22  5:17 AM  Result Value Ref Range   Cortisol - AM 15.8 6.7 - 22.6 ug/dL    Comment: Performed at [facility], [address] [place], [place] [address]  Procalcitonin     Status: None   Collection Time: 10/27/22  5:17 AM  Result Value Ref Range   Procalcitonin 6.47 ng/mL    Comment:        Interpretation: PCT > 2 ng/mL: Systemic infection (sepsis) is likely, unless other causes are known. (NOTE)       Sepsis PCT Algorithm           Lower Respiratory Tract                                      Infection PCT Algorithm    ----------------------------     ----------------------------         PCT < 0.25 ng/mL                PCT < 0.10 ng/mL          Strongly encourage             Strongly discourage   discontinuation of antibiotics    initiation of antibiotics    ----------------------------     -----------------------------       PCT 0.25 - 0.50 ng/mL            PCT 0.10 - 0.25 ng/mL               OR       >80% decrease in PCT            Discourage initiation of                                            antibiotics      Encourage discontinuation           of antibiotics    ----------------------------     -----------------------------         PCT >= 0.50 ng/mL              PCT 0.26 - 0.50 ng/mL               AND       <80% decrease in PCT  Encourage initiation of                                             antibiotics       Encourage continuation           of antibiotics    ----------------------------     -----------------------------        PCT >= 0.50 ng/mL                  PCT > 0.50 ng/mL               AND         increase in PCT                  Strongly encourage                                      initiation of antibiotics    Strongly encourage escalation           of antibiotics                                     -----------------------------                                           PCT <= 0.25 ng/mL                                                 OR                                        > 80% decrease in PCT                                       Discontinue / Do not initiate                                             antibiotics  Performed at [facility], [address] [place], [place] [address]   Comprehensive metabolic panel     Status: Abnormal   Collection Time: 10/27/22  5:17 AM  Result Value Ref Range   Sodium 140 135 - 145 mmol/L   Potassium 4.3 3.5 - 5.1 mmol/L   Chloride 99 98 - 111 mmol/L   CO2 25 22 - 32 mmol/L   Glucose, Bld 211 (H) 70 - 99 mg/dL    Comment: Glucose reference range applies only to samples taken after fasting for at least 8 hours.   BUN 62 (H) 8 - 23 mg/dL   Creatinine, Ser 60.45 (H) 0.61 - 1.24 mg/dL   Calcium 9.2 8.9 - 10.3  mg/dL   Total Protein 6.7 6.5 - 8.1 g/dL   Albumin 2.7 (L) 3.5 - 5.0 g/dL   AST 16 15 - 41 U/L   ALT 11 0 - 44 U/L   Alkaline Phosphatase 64 38 - 126 U/L   Total Bilirubin 0.8 0.3 - 1.2 mg/dL   GFR, Estimated 5 (L) >60 mL/min    Comment: (NOTE) Calculated using the CKD-EPI Creatinine Equation (2021)    Anion gap 16 (H) 5 - 15    Comment: Performed at [facility], [address] [place], [place] [address]  CBC     Status: Abnormal   Collection Time: 10/27/22  5:17 AM  Result Value Ref Range   WBC 7.7 4.0 - 10.5 K/uL   RBC 2.46 (L) 4.22 - 5.81 MIL/uL   Hemoglobin 7.7 (L) 13.0 - 17.0 g/dL   HCT 60.4 (L) 54.0 - 98.1 %   MCV 101.2 (H) 80.0 - 100.0 fL   MCH 31.3 26.0 - 34.0 pg   MCHC 30.9 30.0 - 36.0 g/dL   RDW 19.1 47.8 - 29.5 %   Platelets 145 (L) 150 - 400 K/uL   nRBC 0.0 0.0 - 0.2 %    Comment: Performed at [facility], [address] [place], [place] [address]  CBG monitoring, ED     Status: Abnormal   Collection Time: 10/27/22  7:52 AM  Result Value Ref Range   Glucose-Capillary 188 (H) 70 - 99 mg/dL    Comment: Glucose reference range applies only to samples taken after fasting for at least 8 hours.    Current Facility-Administered Medications  Medication Dose Route Frequency Provider Last Rate Last Admin   [START ON  10/28/2022] ceFAZolin (ANCEF) IVPB 1 g/50 mL premix  1 g Intravenous QHS Amin, Tilman Neat, MD       ceFAZolin (ANCEF) IVPB 2g/100 mL premix  2 g Intravenous Once Arnetha Courser, MD       heparin injection 5,000 Units  5,000 Units Subcutaneous Q8H Rometta Emery, MD   5,000 Units at 10/27/22 0517   insulin aspart (novoLOG) injection 0-5 Units  0-5 Units Subcutaneous QHS Rometta Emery, MD   4 Units at 10/27/22 0026   insulin aspart (novoLOG) injection 0-6 Units  0-6 Units Subcutaneous TID WC Rometta Emery, MD   1 Units at 10/27/22 0756   lactated ringers infusion   Intravenous Continuous Georga Hacking, MD   Stopped at 10/27/22 1034   ondansetron (ZOFRAN) tablet 4 mg  4 mg Oral Q6H PRN Rometta Emery, MD       Or   ondansetron (ZOFRAN) injection 4 mg  4 mg Intravenous Q6H PRN Rometta Emery, MD       oxyCODONE (Oxy IR/ROXICODONE) immediate release tablet 5 mg  5 mg Oral Q4H PRN Rometta Emery, MD   5 mg at 10/27/22 0327   Current Outpatient Medications  Medication Sig Dispense Refill   amLODipine (NORVASC) 5 MG tablet Take 5 mg by mouth daily.     ANORO ELLIPTA 62.5-25 MCG/ACT AEPB Inhale 1 puff into the lungs daily.     busPIRone (BUSPAR) 10 MG tablet Take 10 mg by mouth 2 (two) times daily.     Insulin Glargine (BASAGLAR KWIKPEN) 100 UNIT/ML Inject 20 Units into the skin at bedtime.     insulin lispro (HUMALOG) 100 UNIT/ML injection Inject 2 Units into the skin 3 (three) times daily before meals.     metoprolol succinate (TOPROL-XL) 25  MG 24 hr tablet Take 25 mg by mouth daily.     traZODone (DESYREL) 50 MG tablet SMARTSIG:1 Tablet(s) By Mouth Every Evening     TRELEGY ELLIPTA 100-62.5-25 MCG/ACT AEPB Inhale 1 puff into the lungs daily.      Musculoskeletal: Strength & Muscle Tone: decreased Gait & Station:  did not witness Patient leans: N/A  Psychiatric Specialty Exam: Physical Exam Vitals and nursing note reviewed.  Constitutional:      Appearance: Normal  appearance.  HENT:     Head: Normocephalic.     Nose: Nose normal.  Pulmonary:     Effort: Pulmonary effort is normal.  Musculoskeletal:     Cervical back: Normal range of motion.  Neurological:     General: No focal deficit present.     Mental Status: He is alert and oriented to person, place, and time.  Psychiatric:        Attention and Perception: Attention and perception normal.        Mood and Affect: Mood is depressed.        Speech: Speech normal.        Behavior: Behavior normal. Behavior is cooperative.        Thought Content: Thought content normal.        Cognition and Memory: Cognition and memory normal.        Judgment: Judgment normal.     Review of Systems  Psychiatric/Behavioral:  Positive for depression.   All other systems reviewed and are negative.   Blood pressure (!) 169/65, pulse 94, temperature 98.3 F (36.8 C), temperature source Oral, resp. rate 13, height 6' 4.5" (1.943 m), SpO2 99 %.There is no height or weight on file to calculate BMI.  General Appearance: Casual  Eye Contact:  Fair  Speech:  Normal Rate  Volume:  Normal  Mood:  Depressed  Affect:  Congruent  Thought Process:  Coherent  Orientation:  Full (Time, Place, and Person)  Thought Content:  WDL and Logical  Suicidal Thoughts:  No  Homicidal Thoughts:  No  Memory:  Immediate;   Good Recent;   Good Remote;   Good  Judgement:  Good  Insight:  Good  Psychomotor Activity:  Decreased  Concentration:  Concentration: Good and Attention Span: Good  Recall:  Good  Fund of Knowledge:  Good  Language:  Good  Akathisia:  No  Handed:  Right  AIMS (if indicated):     Assets:  Housing Leisure Time Resilience Social Support  ADL's:  Impaired  Cognition:  WNL  Sleep:        Physical Exam: Physical Exam Vitals and nursing note reviewed.  Constitutional:      Appearance: Normal appearance.  HENT:     Head: Normocephalic.     Nose: Nose normal.  Pulmonary:     Effort: Pulmonary  effort is normal.  Musculoskeletal:     Cervical back: Normal range of motion.  Neurological:     General: No focal deficit present.     Mental Status: He is alert and oriented to person, place, and time.  Psychiatric:        Attention and Perception: Attention and perception normal.        Mood and Affect: Mood is depressed.        Speech: Speech normal.        Behavior: Behavior normal. Behavior is cooperative.        Thought Content: Thought content normal.  Cognition and Memory: Cognition and memory normal.        Judgment: Judgment normal.    Review of Systems  Psychiatric/Behavioral:  Positive for depression.   All other systems reviewed and are negative.  Blood pressure (!) 169/65, pulse 94, temperature 98.3 F (36.8 C), temperature source Oral, resp. rate 13, height 6' 4.5" (1.943 m), SpO2 99 %. There is no height or weight on file to calculate BMI.  Treatment Plan Summary: Adjustment disorder with depressed mood: His reverend has been contracted Requests Hospice Care Per note above, the client does have capacity to make his medical decisions and knows the consequences.  Disposition: No evidence of imminent risk to self or others at present.   Patient does not meet criteria for psychiatric inpatient admission. Supportive therapy provided about ongoing stressors.  Nanine Means, NP 10/27/2022 12:15 PM

## 2022-10-28 ENCOUNTER — Other Ambulatory Visit: Payer: Self-pay | Admitting: Nurse Practitioner

## 2022-10-28 LAB — CULTURE, BLOOD (ROUTINE X 2)

## 2022-10-29 LAB — CULTURE, BLOOD (ROUTINE X 2)

## 2022-10-30 ENCOUNTER — Encounter: Payer: Self-pay | Admitting: Internal Medicine

## 2022-10-30 DIAGNOSIS — N186 End stage renal disease: Secondary | ICD-10-CM | POA: Diagnosis not present

## 2022-10-30 DIAGNOSIS — Z992 Dependence on renal dialysis: Secondary | ICD-10-CM | POA: Diagnosis not present

## 2022-10-30 NOTE — Telephone Encounter (Signed)
Please let the company know that the patient has been admitted to hospice and they will take care of the back brace.

## 2022-10-30 NOTE — Telephone Encounter (Signed)
Unable to refill per protocol, Rx expired. Discontinued 08/21/22, dose change.  Requested Prescriptions  Pending Prescriptions Disp Refills   LEVEMIR FLEXPEN 100 UNIT/ML FlexPen [Pharmacy Med Name: LEVEMIR FLEXPEN INJECTION 3ML] 15 mL 1    Sig: ADMINISTER 20 UNITS UNDER THE SKIN AT BEDTIME     Endocrinology:  Diabetes - Insulins Passed - 10/28/2022  9:03 AM      Passed - HBA1C is between 0 and 7.9 and within 180 days    Hemoglobin A1C  Date Value Ref Range Status  10/20/2019 6.0  Final   HB A1C (BAYER DCA - WAIVED)  Date Value Ref Range Status  04/28/2021 5.1 4.8 - 5.6 % Final    Comment:             Prediabetes: 5.7 - 6.4          Diabetes: >6.4          Glycemic control for adults with diabetes: <7.0               **Please note reference interval change**    Hgb A1c MFr Bld  Date Value Ref Range Status  10/27/2022 8.2 (H) 4.8 - 5.6 % Final    Comment:    (NOTE) Pre diabetes:          5.7%-6.4%  Diabetes:              >6.4%  Glycemic control for   <7.0% adults with diabetes          Passed - Valid encounter within last 6 months    Recent Outpatient Visits           1 month ago Hospital discharge follow-up   [facility] Larae Grooms, NP   3 months ago Chronic obstructive pulmonary disease, unspecified COPD type [facility])   [place] Larae Grooms, NP   5 months ago Atherosclerosis of native artery of both lower extremities with intermittent claudication [facility])   [facility] Larae Grooms, NP   8 months ago Hypertension associated with diabetes [facility])   [facility] Larae Grooms, NP   11 months ago Acute bacterial conjunctivitis of both eyes    [facility] [place], Dorie Rank, NP

## 2022-10-31 ENCOUNTER — Ambulatory Visit: Payer: Medicare Other | Admitting: Student in an Organized Health Care Education/Training Program

## 2022-11-01 NOTE — Telephone Encounter (Signed)
Tried calling the company back, got a busy signal. Will try to call again later.  

## 2022-11-07 ENCOUNTER — Ambulatory Visit: Payer: BLUE CROSS/BLUE SHIELD | Admitting: Physician Assistant

## 2022-11-07 DIAGNOSIS — Z992 Dependence on renal dialysis: Secondary | ICD-10-CM | POA: Diagnosis not present

## 2022-11-07 DIAGNOSIS — N186 End stage renal disease: Secondary | ICD-10-CM | POA: Diagnosis not present

## 2022-11-08 NOTE — Telephone Encounter (Signed)
Tried calling the company back, got a busy signal. Will try to call again later.

## 2022-11-08 DEATH — deceased
